# Patient Record
Sex: Female | Born: 1985 | Race: White | Hispanic: No | Smoking: Current every day smoker
Health system: Southern US, Community
[De-identification: ages and names within clinical notes are randomized; demographics above are authoritative.]

## PROBLEM LIST (undated history)

## (undated) ENCOUNTER — Inpatient Hospital Stay: Payer: Self-pay

## (undated) ENCOUNTER — Inpatient Hospital Stay (HOSPITAL_COMMUNITY): Payer: Self-pay

## (undated) DIAGNOSIS — K5090 Crohn's disease, unspecified, without complications: Secondary | ICD-10-CM

## (undated) DIAGNOSIS — J9383 Other pneumothorax: Secondary | ICD-10-CM

## (undated) DIAGNOSIS — R627 Adult failure to thrive: Secondary | ICD-10-CM

## (undated) DIAGNOSIS — F1010 Alcohol abuse, uncomplicated: Secondary | ICD-10-CM

## (undated) DIAGNOSIS — K7010 Alcoholic hepatitis without ascites: Secondary | ICD-10-CM

## (undated) DIAGNOSIS — C50919 Malignant neoplasm of unspecified site of unspecified female breast: Secondary | ICD-10-CM

## (undated) DIAGNOSIS — F418 Other specified anxiety disorders: Secondary | ICD-10-CM

## (undated) DIAGNOSIS — D696 Thrombocytopenia, unspecified: Secondary | ICD-10-CM

## (undated) DIAGNOSIS — I160 Hypertensive urgency: Secondary | ICD-10-CM

## (undated) DIAGNOSIS — H905 Unspecified sensorineural hearing loss: Secondary | ICD-10-CM

## (undated) DIAGNOSIS — I499 Cardiac arrhythmia, unspecified: Secondary | ICD-10-CM

## (undated) DIAGNOSIS — J930 Spontaneous tension pneumothorax: Secondary | ICD-10-CM

## (undated) DIAGNOSIS — N289 Disorder of kidney and ureter, unspecified: Secondary | ICD-10-CM

## (undated) DIAGNOSIS — K529 Noninfective gastroenteritis and colitis, unspecified: Secondary | ICD-10-CM

## (undated) DIAGNOSIS — Q453 Other congenital malformations of pancreas and pancreatic duct: Secondary | ICD-10-CM

## (undated) DIAGNOSIS — A749 Chlamydial infection, unspecified: Secondary | ICD-10-CM

## (undated) DIAGNOSIS — R569 Unspecified convulsions: Secondary | ICD-10-CM

## (undated) HISTORY — PX: CHEST TUBE INSERTION: SHX231

## (undated) HISTORY — DX: Unspecified sensorineural hearing loss: H90.5

## (undated) HISTORY — DX: Malignant neoplasm of unspecified site of unspecified female breast: C50.919

---
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
## 2004-02-29 ENCOUNTER — Encounter: Admission: RE | Admit: 2004-02-29 | Discharge: 2004-02-29 | Payer: Self-pay | Admitting: Family Medicine

## 2005-08-27 DIAGNOSIS — A749 Chlamydial infection, unspecified: Secondary | ICD-10-CM

## 2005-08-27 HISTORY — DX: Chlamydial infection, unspecified: A74.9

## 2005-10-06 ENCOUNTER — Emergency Department: Payer: Self-pay | Admitting: General Practice

## 2005-10-22 ENCOUNTER — Inpatient Hospital Stay (HOSPITAL_COMMUNITY): Admission: RE | Admit: 2005-10-22 | Discharge: 2005-10-22 | Payer: Self-pay | Admitting: *Deleted

## 2005-10-31 ENCOUNTER — Ambulatory Visit: Payer: Self-pay | Admitting: Obstetrics and Gynecology

## 2005-10-31 ENCOUNTER — Ambulatory Visit (HOSPITAL_COMMUNITY): Admission: RE | Admit: 2005-10-31 | Discharge: 2005-10-31 | Payer: Self-pay | Admitting: *Deleted

## 2005-11-11 ENCOUNTER — Emergency Department: Payer: Self-pay | Admitting: Emergency Medicine

## 2006-11-22 ENCOUNTER — Ambulatory Visit (HOSPITAL_COMMUNITY): Admission: RE | Admit: 2006-11-22 | Discharge: 2006-11-22 | Payer: Self-pay | Admitting: Obstetrics & Gynecology

## 2006-12-30 ENCOUNTER — Ambulatory Visit (HOSPITAL_COMMUNITY): Admission: RE | Admit: 2006-12-30 | Discharge: 2006-12-30 | Payer: Self-pay | Admitting: Obstetrics & Gynecology

## 2007-01-13 ENCOUNTER — Ambulatory Visit (HOSPITAL_COMMUNITY): Admission: RE | Admit: 2007-01-13 | Discharge: 2007-01-13 | Payer: Self-pay | Admitting: Obstetrics & Gynecology

## 2007-02-28 ENCOUNTER — Observation Stay: Payer: Self-pay

## 2007-04-21 ENCOUNTER — Ambulatory Visit: Payer: Self-pay | Admitting: Physician Assistant

## 2007-04-21 ENCOUNTER — Inpatient Hospital Stay (HOSPITAL_COMMUNITY): Admission: AD | Admit: 2007-04-21 | Discharge: 2007-04-22 | Payer: Self-pay | Admitting: Obstetrics & Gynecology

## 2007-04-28 DIAGNOSIS — D696 Thrombocytopenia, unspecified: Secondary | ICD-10-CM

## 2007-04-28 HISTORY — DX: Thrombocytopenia, unspecified: D69.6

## 2007-05-18 ENCOUNTER — Inpatient Hospital Stay (HOSPITAL_COMMUNITY): Admission: AD | Admit: 2007-05-18 | Discharge: 2007-05-21 | Payer: Self-pay | Admitting: Obstetrics & Gynecology

## 2007-05-18 ENCOUNTER — Ambulatory Visit: Payer: Self-pay | Admitting: Obstetrics & Gynecology

## 2008-08-27 DIAGNOSIS — I499 Cardiac arrhythmia, unspecified: Secondary | ICD-10-CM

## 2008-08-27 HISTORY — DX: Cardiac arrhythmia, unspecified: I49.9

## 2008-12-08 ENCOUNTER — Emergency Department: Payer: Self-pay | Admitting: Emergency Medicine

## 2009-04-25 ENCOUNTER — Ambulatory Visit (HOSPITAL_COMMUNITY): Admission: RE | Admit: 2009-04-25 | Discharge: 2009-04-25 | Payer: Self-pay | Admitting: Obstetrics & Gynecology

## 2009-06-06 ENCOUNTER — Ambulatory Visit (HOSPITAL_COMMUNITY): Admission: RE | Admit: 2009-06-06 | Discharge: 2009-06-06 | Payer: Self-pay | Admitting: Obstetrics & Gynecology

## 2009-06-27 ENCOUNTER — Ambulatory Visit (HOSPITAL_COMMUNITY): Admission: RE | Admit: 2009-06-27 | Discharge: 2009-06-27 | Payer: Self-pay | Admitting: Internal Medicine

## 2009-07-13 ENCOUNTER — Ambulatory Visit (HOSPITAL_COMMUNITY): Admission: RE | Admit: 2009-07-13 | Discharge: 2009-07-13 | Payer: Self-pay | Admitting: Family Medicine

## 2009-07-20 ENCOUNTER — Ambulatory Visit (HOSPITAL_COMMUNITY): Admission: RE | Admit: 2009-07-20 | Discharge: 2009-07-20 | Payer: Self-pay | Admitting: Family Medicine

## 2009-07-20 ENCOUNTER — Encounter: Payer: Self-pay | Admitting: Family Medicine

## 2009-07-21 ENCOUNTER — Inpatient Hospital Stay (HOSPITAL_COMMUNITY): Admission: AD | Admit: 2009-07-21 | Discharge: 2009-07-21 | Payer: Self-pay | Admitting: Family Medicine

## 2009-07-27 ENCOUNTER — Encounter: Payer: Self-pay | Admitting: Family Medicine

## 2009-07-27 ENCOUNTER — Ambulatory Visit (HOSPITAL_COMMUNITY): Admission: RE | Admit: 2009-07-27 | Discharge: 2009-07-27 | Payer: Self-pay | Admitting: Family Medicine

## 2009-07-30 ENCOUNTER — Inpatient Hospital Stay (HOSPITAL_COMMUNITY): Admission: AD | Admit: 2009-07-30 | Discharge: 2009-07-30 | Payer: Self-pay | Admitting: Obstetrics & Gynecology

## 2009-07-31 ENCOUNTER — Inpatient Hospital Stay (HOSPITAL_COMMUNITY): Admission: AD | Admit: 2009-07-31 | Discharge: 2009-08-02 | Payer: Self-pay | Admitting: Obstetrics & Gynecology

## 2009-07-31 ENCOUNTER — Encounter: Payer: Self-pay | Admitting: Obstetrics & Gynecology

## 2009-07-31 ENCOUNTER — Ambulatory Visit: Payer: Self-pay | Admitting: Obstetrics & Gynecology

## 2009-08-04 ENCOUNTER — Inpatient Hospital Stay (HOSPITAL_COMMUNITY): Admission: AD | Admit: 2009-08-04 | Discharge: 2009-08-04 | Payer: Self-pay | Admitting: Obstetrics & Gynecology

## 2009-08-04 ENCOUNTER — Ambulatory Visit: Payer: Self-pay | Admitting: Advanced Practice Midwife

## 2010-09-17 ENCOUNTER — Encounter: Payer: Self-pay | Admitting: *Deleted

## 2010-11-28 LAB — CBC
HCT: 37.4 % (ref 36.0–46.0)
Hemoglobin: 12.6 g/dL (ref 12.0–15.0)
Platelets: 214 10*3/uL (ref 150–400)
RBC: 4.05 MIL/uL (ref 3.87–5.11)

## 2010-11-28 LAB — TYPE AND SCREEN: ABO/RH(D): O POS

## 2010-11-28 LAB — RPR: RPR Ser Ql: NONREACTIVE

## 2011-01-09 NOTE — Discharge Summary (Signed)
NAMEDERHONDA, EASTLICK             ACCOUNT NO.:  [id number]   MEDICAL RECORD NO.:  [id number]          PATIENT TYPE:  INP   LOCATION:  9114                          FACILITY:  [facility]   PHYSICIAN:  Emily Filbert, MD        DATE OF BIRTH:  1986-05-19   DATE OF ADMISSION:  05/18/2007  DATE OF DISCHARGE:  05/21/2007                               DISCHARGE SUMMARY   ADMISSION DIAGNOSES:  1. Intrauterine pregnancy at 38 weeks 2 days.  2. Premature rupture of membranes.   DISCHARGE DIAGNOSIS:  Postpartum day #2 status post normal spontaneous  vaginal delivery of a viable female infant.   DISCHARGE MEDICATIONS:  1. Ibuprofen 600 mg one tablet p.o. q.6 hours p.r.n. pain.  2. Colace 100 mg one tablet p.o. b.i.d. p.r.n. constipation.  3. Dermaplast spray to be used per manufacturer instructions p.r.n.      laceration pain.   DISCHARGE LABORATORY DATA:  Hemoglobin 11.3, hematocrit 32.9, platelets  117, GBS negative, rubella immune, blood type O positive, antibody  negative, RPR nonreactive, hepatitis B surface antigen negative, HIV  negative.   HOSPITAL COURSE:  The patient is a 25 year old gravida 2, para 1-0-1-1,  who presented at 38-2/7 weeks with premature rupture of membranes.  She  went on to deliver a viable female via a normal spontaneous vaginal  delivery on September 22.  The infant's Apgars were 9 at one minute and  9 at five minutes.  There was a nuchal cord x1.  The patient did have a  second degree midline episiotomy cut which was repaired with 3-0 Vicryl  suture.  Her estimated blood loss was 350 mL.  During the course of her  labor, the patient was found to have some low platelets and elevated  blood pressures.  Her platelets were 127 on admission, 133 on repeat,  and then 117 by day of discharge.  Her other pregnancy-induced  hypertension laboratory values were within normal limits.  She did not  have any protein on her urinalysis.  Her uric acid level was 5.9.  Her  LDH and liver  transaminases were all within normal limits.  However,  given her decreased platelets and elevated blood pressure she was placed  on magnesium sulfate immediately prior to delivery and remained on it  for 24 hours postpartum.  She had been ruptured for 16 hours prior to  delivery of her infant.  The patient had an unremarkable postpartum  course and was deemed stable for discharge on postpartum day #2.  She  plans to bottle feed and did not desire a circumcision for her infant.  She also plans to use condoms for birth control.   DISCHARGE INSTRUCTIONS:  1. Nothing in vagina x6 weeks.  2. No heavy lifting x6 weeks.  3. Follow up at the [facility] Department in six weeks.  4. Take all medicines as directed.   DISPOSITION:  The patient is discharged to home in stable condition with  her infant.      Sherrell Puller, MD      Emily Filbert, MD  Electronically  Signed    TCB/MEDQ  D:  05/21/2007  T:  05/21/2007  Job:  [id number]

## 2011-06-07 LAB — CBC
HCT: 38.3
HCT: 38.8
Hemoglobin: 11.3 — ABNORMAL LOW
Hemoglobin: 13.1
Hemoglobin: 13.3
MCHC: 34.2
MCHC: 34.4
MCV: 91.9
MCV: 91.9
Platelets: 117 — ABNORMAL LOW
Platelets: 127 — ABNORMAL LOW
Platelets: 133 — ABNORMAL LOW
RBC: 4.22
RDW: 13.2
RDW: 13.6
RDW: 13.7
WBC: 10

## 2011-06-07 LAB — URINALYSIS, DIPSTICK ONLY
Bilirubin Urine: NEGATIVE
Glucose, UA: NEGATIVE
Hgb urine dipstick: NEGATIVE
Nitrite: NEGATIVE
Protein, ur: NEGATIVE
Urobilinogen, UA: 0.2
pH: 6

## 2011-06-07 LAB — URIC ACID: Uric Acid, Serum: 5.9

## 2011-06-07 LAB — COMPREHENSIVE METABOLIC PANEL
Chloride: 106
Creatinine, Ser: 0.49
GFR calc Af Amer: >60
Potassium: 3.9
Total Protein: 6.4

## 2011-06-07 LAB — DIFFERENTIAL
Basophils Absolute: 0
Basophils Relative: 0
Eosinophils Relative: 0
Monocytes Absolute: 1.1 — ABNORMAL HIGH
Neutro Abs: 8.6 — ABNORMAL HIGH

## 2011-06-07 LAB — LACTATE DEHYDROGENASE: LDH: 155

## 2011-06-07 LAB — SYPHILIS: RPR W/REFLEX TO RPR TITER AND TREPONEMAL ANTIBODIES, TRADITIONAL SCREENING AND DIAGNOSIS ALGORITHM: RPR Ser Ql: NONREACTIVE

## 2011-06-08 LAB — WET PREP, GENITAL
Clue Cells Wet Prep HPF POC: NONE SEEN
Yeast Wet Prep HPF POC: NONE SEEN

## 2011-08-31 ENCOUNTER — Inpatient Hospital Stay (HOSPITAL_COMMUNITY)
Admission: AD | Admit: 2011-08-31 | Discharge: 2011-08-31 | Disposition: A | Discharge status: Home / Self Care | Payer: Medicaid Other | Source: Ambulatory Visit | Attending: Obstetrics & Gynecology | Admitting: Obstetrics & Gynecology

## 2011-08-31 ENCOUNTER — Encounter (HOSPITAL_COMMUNITY): Payer: Self-pay | Admitting: *Deleted

## 2011-08-31 DIAGNOSIS — R300 Dysuria: Secondary | ICD-10-CM

## 2011-08-31 DIAGNOSIS — M549 Dorsalgia, unspecified: Secondary | ICD-10-CM | POA: Insufficient documentation

## 2011-08-31 HISTORY — DX: Cardiac arrhythmia, unspecified: I49.9

## 2011-08-31 HISTORY — DX: Chlamydial infection, unspecified: A74.9

## 2011-08-31 LAB — WET PREP, GENITAL
Clue Cells Wet Prep HPF POC: NONE SEEN
Trich, Wet Prep: NONE SEEN

## 2011-08-31 LAB — URINALYSIS, ROUTINE W REFLEX MICROSCOPIC
Glucose, UA: NEGATIVE mg/dL
Nitrite: NEGATIVE
Protein, ur: NEGATIVE mg/dL
Urobilinogen, UA: 0.2 mg/dL (ref 0.0–1.0)

## 2011-08-31 LAB — POCT PREGNANCY, URINE: Preg Test, Ur: NEGATIVE

## 2011-08-31 MED ORDER — FLUCONAZOLE 150 MG PO TABS: 150.0000 mg | ORAL_TABLET | Freq: Once | ORAL | Status: AC

## 2011-08-31 NOTE — Progress Notes (Signed)
Left flank pain x 2 days, feeling pressure with urination, voiding sufficient quantities, LMP 08/11/11

## 2011-08-31 NOTE — ED Provider Notes (Signed)
History     Chief Complaint  Patient presents with  . Dysuria   HPISandra JODENE Macias is 26 y.o. [phone] presents with left back pain.  Has slight dysuria, frequency, and urgency.  Reports elevated temp of 99.8 last night but normal today.  She does not have a doctor.  Sexually active with 1 partner.  Is concerned about vaginal infections too.   Past Medical History  Diagnosis Date  . Anxiety   . Chlamydia 2007  . Irregular heart beat 2010    wt/diet related after evaluation    Past Surgical History  Procedure Date  . Cesarean section     Family History  Problem Relation Age of Onset  . Anesthesia problems Neg Hx     History  Substance Use Topics  . Smoking status: Current Everyday Smoker -- 1.0 packs/day  . Smokeless tobacco: Never Used  . Alcohol Use: 0.0 oz/week     4 per month of vodka    Allergies:  Allergies  Allergen Reactions  . Sulfa Antibiotics Anaphylaxis  . Tetracyclines & Related Anaphylaxis  . Latex Hives and Itching    Prescriptions prior to admission  Medication Sig Dispense Refill  . acetaminophen (TYLENOL) 160 MG chewable tablet Chew 160 mg by mouth every 6 (six) hours as needed. For headache.       . NON FORMULARY Take 15 mLs by mouth daily. Cream of Tartar made at home. For UTI.         Review of Systems  Constitutional: Positive for fever. Negative for chills.  Gastrointestinal: Negative for nausea, vomiting and abdominal pain.  Genitourinary: Positive for dysuria, urgency, frequency and flank pain. Negative for hematuria.       Negative vaginal bleeding Positive for mild white vaginal discharge   Physical Exam   Blood pressure 108/71, pulse 89, temperature 98.3 F (36.8 C), temperature source Oral, resp. rate 16, height 5' 6"  (1.676 m), weight 94 lb 6.4 oz (42.82 kg), last menstrual period 08/11/2011.  Physical Exam  Constitutional: She is oriented to person, place, and time. She appears well-developed and well-nourished. No  distress.  HENT:  Head: Normocephalic.  Neck: Normal range of motion.  Cardiovascular: Normal rate.   Respiratory: Effort normal.  GI: Soft. She exhibits no distension and no mass. There is no tenderness. There is no rebound and no guarding.  Genitourinary: Uterus is tender. Uterus is not enlarged. Cervix exhibits no discharge and no friability. Right adnexum displays no mass, no tenderness and no fullness. Left adnexum displays no mass, no tenderness and no fullness. No bleeding around the vagina. Vaginal discharge (moderate amount of yellowish discharge with odor) found.  Musculoskeletal:       + for lower back pain left greater than right.  Negative for flank pain.  Neurological: She is alert and oriented to person, place, and time.  Skin: Skin is warm and dry.  Psychiatric: She has a normal mood and affect.    MAU Course  Procedures  GC/CHL culture to lab  MDM 18:33 Care turned over to S. Andres Labrum NP Results for orders placed during the hospital encounter of 08/31/11 (from the past 24 hour(s))  URINALYSIS, ROUTINE W REFLEX MICROSCOPIC     Status: Abnormal   Collection Time   08/31/11  4:30 PM      Component Value Range   Color, Urine YELLOW  YELLOW    APPearance CLEAR  CLEAR    Specific Gravity, Urine 1.025  1.005 - 1.030  pH 5.5  5.0 - 8.0    Glucose, UA NEGATIVE  NEGATIVE (mg/dL)   Hgb urine dipstick TRACE (*) NEGATIVE    Bilirubin Urine NEGATIVE  NEGATIVE    Ketones, ur NEGATIVE  NEGATIVE (mg/dL)   Protein, ur NEGATIVE  NEGATIVE (mg/dL)   Urobilinogen, UA 0.2  0.0 - 1.0 (mg/dL)   Nitrite NEGATIVE  NEGATIVE    Leukocytes, UA TRACE (*) NEGATIVE   URINE MICROSCOPIC-ADD ON     Status: Abnormal   Collection Time   08/31/11  4:30 PM      Component Value Range   Squamous Epithelial / LPF FEW (*) RARE    WBC, UA 7-10  <3 (WBC/hpf)   RBC / HPF 3-6  <3 (RBC/hpf)   Bacteria, UA FEW (*) RARE   POCT PREGNANCY, URINE     Status: Normal   Collection Time   08/31/11  4:38 PM       Component Value Range   Preg Test, Ur NEGATIVE    WET PREP, GENITAL     Status: Abnormal   Collection Time   08/31/11  6:15 PM      Component Value Range   Yeast, Wet Prep NONE SEEN  NONE SEEN    Trich, Wet Prep NONE SEEN  NONE SEEN    Clue Cells, Wet Prep NONE SEEN  NONE SEEN    WBC, Wet Prep HPF POC FEW (*) NONE SEEN    Assessment and Plan    KEY,EVE M 08/31/2011, 6:01 PM   Kristine Fisher, NP 08/31/11 1834

## 2011-09-01 LAB — GC/CHLAMYDIA PROBE AMP, GENITAL
Chlamydia, DNA Probe: NEGATIVE
GC Probe Amp, Genital: NEGATIVE

## 2011-09-02 ENCOUNTER — Emergency Department (HOSPITAL_COMMUNITY)
Admission: EM | Admit: 2011-09-02 | Discharge: 2011-09-02 | Disposition: A | Discharge status: Home / Self Care | Payer: Medicaid Other | Attending: Emergency Medicine | Admitting: Emergency Medicine

## 2011-09-02 ENCOUNTER — Encounter (HOSPITAL_COMMUNITY): Payer: Self-pay

## 2011-09-02 DIAGNOSIS — R300 Dysuria: Secondary | ICD-10-CM | POA: Insufficient documentation

## 2011-09-02 DIAGNOSIS — R109 Unspecified abdominal pain: Secondary | ICD-10-CM | POA: Insufficient documentation

## 2011-09-02 DIAGNOSIS — R3915 Urgency of urination: Secondary | ICD-10-CM | POA: Insufficient documentation

## 2011-09-02 DIAGNOSIS — N12 Tubulo-interstitial nephritis, not specified as acute or chronic: Secondary | ICD-10-CM | POA: Insufficient documentation

## 2011-09-02 DIAGNOSIS — R630 Anorexia: Secondary | ICD-10-CM | POA: Insufficient documentation

## 2011-09-02 DIAGNOSIS — R112 Nausea with vomiting, unspecified: Secondary | ICD-10-CM | POA: Insufficient documentation

## 2011-09-02 DIAGNOSIS — R000 Tachycardia, unspecified: Secondary | ICD-10-CM | POA: Insufficient documentation

## 2011-09-02 DIAGNOSIS — R509 Fever, unspecified: Secondary | ICD-10-CM | POA: Insufficient documentation

## 2011-09-02 DIAGNOSIS — N949 Unspecified condition associated with female genital organs and menstrual cycle: Secondary | ICD-10-CM | POA: Insufficient documentation

## 2011-09-02 DIAGNOSIS — K5900 Constipation, unspecified: Secondary | ICD-10-CM | POA: Insufficient documentation

## 2011-09-02 DIAGNOSIS — R350 Frequency of micturition: Secondary | ICD-10-CM | POA: Insufficient documentation

## 2011-09-02 HISTORY — DX: Disorder of kidney and ureter, unspecified: N28.9

## 2011-09-02 LAB — CBC
MCHC: 34.3 g/dL (ref 30.0–36.0)
Platelets: 257 10*3/uL (ref 150–400)
RDW: 13 % (ref 11.5–15.5)
WBC: 14.3 10*3/uL — ABNORMAL HIGH (ref 4.0–10.5)

## 2011-09-02 LAB — URINALYSIS, ROUTINE W REFLEX MICROSCOPIC
Glucose, UA: NEGATIVE mg/dL
Ketones, ur: 15 mg/dL — AB
Nitrite: POSITIVE — AB
Protein, ur: NEGATIVE mg/dL
pH: 5.5 (ref 5.0–8.0)

## 2011-09-02 LAB — BASIC METABOLIC PANEL
CO2: 22 mEq/L (ref 19–32)
Calcium: 9.4 mg/dL (ref 8.4–10.5)
Chloride: 100 mEq/L (ref 96–112)
Creatinine, Ser: 0.61 mg/dL (ref 0.50–1.10)
GFR calc Af Amer: >90 mL/min (ref >90)
Sodium: 134 mEq/L — ABNORMAL LOW (ref 135–145)

## 2011-09-02 LAB — DIFFERENTIAL
Basophils Absolute: 0 10*3/uL (ref 0.0–0.1)
Basophils Relative: 0 % (ref 0–1)
Lymphocytes Relative: 6 % — ABNORMAL LOW (ref 12–46)
Neutro Abs: 12 10*3/uL — ABNORMAL HIGH (ref 1.7–7.7)

## 2011-09-02 LAB — URINE MICROSCOPIC-ADD ON

## 2011-09-02 MED ORDER — CEPHALEXIN 500 MG PO CAPS: 500.0000 mg | ORAL_CAPSULE | Freq: Four times a day (QID) | ORAL | Status: AC

## 2011-09-02 MED ORDER — SODIUM CHLORIDE 0.9 % IV BOLUS (SEPSIS)
1000.0000 mL | Freq: Once | INTRAVENOUS | Status: AC
  Administered 2011-09-02: 1000 mL via INTRAVENOUS

## 2011-09-02 MED ORDER — DEXTROSE 5 % IV SOLN
1.0000 g | Freq: Once | INTRAVENOUS | Status: AC
  Administered 2011-09-02: 1 g via INTRAVENOUS
  Filled 2011-09-02: qty 10

## 2011-09-02 MED ORDER — FENTANYL CITRATE 0.05 MG/ML IJ SOLN: 50.0000 ug | Freq: Once | INTRAMUSCULAR | Status: DC

## 2011-09-02 MED ORDER — ONDANSETRON HCL 4 MG PO TABS: 4.0000 mg | ORAL_TABLET | Freq: Four times a day (QID) | ORAL | Status: AC

## 2011-09-02 MED ORDER — ONDANSETRON HCL 4 MG/2ML IJ SOLN: 4.0000 mg | Freq: Once | INTRAMUSCULAR | Status: DC

## 2011-09-02 MED ORDER — HYDROCODONE-ACETAMINOPHEN 5-500 MG PO TABS: 1.0000 | ORAL_TABLET | Freq: Four times a day (QID) | ORAL | Status: AC | PRN

## 2011-09-02 NOTE — ED Notes (Signed)
Patient was given crackers and a sprite

## 2011-09-02 NOTE — ED Notes (Signed)
Patient is resting comfortably. 

## 2011-09-02 NOTE — ED Notes (Signed)
Family at bedside. 

## 2011-09-02 NOTE — ED Notes (Signed)
-      PT WANTS TO HOLD OFF ON GETTING THE FENTANYL (4-5 / 10 PAIN) & ZOFRAN AT PRESENT.........Marland Kitchen

## 2011-09-02 NOTE — ED Notes (Signed)
Per report, patient did not want to take either zofran or fenantyl at this time.  meds are on hold at this point.

## 2011-09-02 NOTE — ED Notes (Signed)
MD at bedside. 

## 2011-09-02 NOTE — ED Provider Notes (Signed)
History     CSN: [id number]  Arrival date & time 09/02/11  1042   First MD Initiated Contact with Patient 09/02/11 1056      Chief Complaint  Patient presents with  . Flank Pain    (Consider location/radiation/quality/duration/timing/severity/associated sxs/prior treatment) Patient is a 26 y.o. female presenting with flank pain. The history is provided by the patient.  Flank Pain This is a new problem. Episode onset: 4 days ago. The problem occurs constantly. The problem has been gradually worsening. Pertinent negatives include no abdominal pain and no shortness of breath. Associated symptoms comments: Dysuria, fever, urgency and frequency. Exacerbated by: Urinate. The symptoms are relieved by nothing. Treatments tried: Was given a medication for a yeast and she was seen up within 2 days ago which she took yesterday. The treatment provided no relief.    Past Medical History  Diagnosis Date  . Anxiety   . Chlamydia 2007  . Irregular heart beat 2010    wt/diet related after evaluation  . Renal disorder     Past Surgical History  Procedure Date  . Cesarean section     Family History  Problem Relation Age of Onset  . Anesthesia problems Neg Hx     History  Substance Use Topics  . Smoking status: Current Everyday Smoker -- 1.0 packs/day  . Smokeless tobacco: Never Used  . Alcohol Use: 0.0 oz/week     4 per month of vodka    OB History    Grav Para Term Preterm Abortions TAB SAB Ect Mult Living   4 2 2  0 2 1 1  0 0 2      Review of Systems  Constitutional: Positive for fever, chills and appetite change.  Respiratory: Negative for cough and shortness of breath.   Gastrointestinal: Positive for nausea, vomiting and constipation. Negative for abdominal pain and diarrhea.  Genitourinary: Positive for dysuria, urgency, frequency and flank pain. Negative for vaginal bleeding, vaginal discharge and pelvic pain.  All other systems reviewed and are negative.    Allergies    Sulfa antibiotics; Tetracyclines & related; and Latex  Home Medications   Current Outpatient Rx  Name Route Sig Dispense Refill  . ACETAMINOPHEN 160 MG PO CHEW Oral Chew 160 mg by mouth every 6 (six) hours as needed. For headache.     . MULTIVITAMIN GUMMIES ADULT PO Oral Take 1 tablet by mouth daily.      Marland Kitchen FLUCONAZOLE 150 MG PO TABS Oral Take 1 tablet (150 mg total) by mouth once. 2 tablet 3    BP 108/84  Pulse 114  Temp(Src) 98.7 F (37.1 C) (Oral)  Resp 20  SpO2 100%  LMP 08/11/2011  Physical Exam  Nursing note and vitals reviewed. Constitutional: She is oriented to person, place, and time. She appears well-developed and well-nourished. She appears distressed.  HENT:  Head: Normocephalic and atraumatic.  Mouth/Throat: Oropharynx is clear and moist. Mucous membranes are dry.  Eyes: Conjunctivae and EOM are normal. Pupils are equal, round, and reactive to light.  Neck: Normal range of motion. Neck supple.  Cardiovascular: Regular rhythm and intact distal pulses.  Tachycardia present.   No murmur heard. Pulmonary/Chest: Effort normal and breath sounds normal. No respiratory distress. She has no wheezes. She has no rales.  Abdominal: Soft. Bowel sounds are normal. She exhibits no distension. There is no tenderness. There is CVA tenderness. There is no rebound and no guarding.       Severe left-sided flank tenderness  Musculoskeletal: Normal range  of motion. She exhibits no edema and no tenderness.  Neurological: She is alert and oriented to person, place, and time.  Skin: Skin is warm and dry. No rash noted. No erythema.  Psychiatric: She has a normal mood and affect. Her behavior is normal.    ED Course  Procedures (including critical care time)  Labs Reviewed  CBC - Abnormal; Notable for the following:    WBC 14.3 (*)    Hemoglobin 15.5 (*)    All other components within normal limits  DIFFERENTIAL - Abnormal; Notable for the following:    Neutrophils Relative 84 (*)     Neutro Abs 12.0 (*)    Lymphocytes Relative 6 (*)    Monocytes Absolute 1.3 (*)    All other components within normal limits  BASIC METABOLIC PANEL - Abnormal; Notable for the following:    Sodium 134 (*)    All other components within normal limits  URINALYSIS, ROUTINE W REFLEX MICROSCOPIC - Abnormal; Notable for the following:    APPearance CLOUDY (*)    Hgb urine dipstick SMALL (*)    Ketones, ur 15 (*)    Nitrite POSITIVE (*)    Leukocytes, UA MODERATE (*)    All other components within normal limits  URINE MICROSCOPIC-ADD ON - Abnormal; Notable for the following:    Bacteria, UA MANY (*)    All other components within normal limits  URINE CULTURE   No results found.   No diagnosis found.    MDM   Patient with ongoing urinary symptoms for the last 4 days with left flank pain starting 3 days ago. She was seen at [facility] on 08/31/2011 and had a UA at that time that showed trace leukocytes and 7-13 white blood cells but was not treated for a UTI. They stated she had a yeast infection however she had a wet prep that showed no yeast no true components no clue cells and only a few white blood cells. Patient states yesterday she took the medication 3 E. the last night the pain in the left flank and got worse and accompanied by nausea, vomiting, anorexia. Fever of 101 last night as well. The patient has never had a kidney stone but states she has had pyelonephritis in the past over 5 years ago. On exam today she has severe left flank tenderness and urgency with palpation of the bladder but no abdominal tenderness making appendicitis, diverticulitis, any other bowel pathology unlikely. She denies any vaginal discharge or vaginal complaints and with a normal wet prep Pap smear done just a few days ago with a negative pelvic exam feel that a vaginal source for symptoms is unlikely. Concern for UTI and pyelonephritis. CBC, BMP, UA pending. IV fluids, nausea, pain medication  given  11:50 AM Urine positive for a UTI. Will give IV Rocephin and will fluid challenge to insure tolerating by mouth as before going home.  1:57 PM Leukocytosis of 14,000 and normal BMP. On reevaluation the patient's pain is much improved and nausea is gone. Patient fluid challenge and did not vomit. Will discharge home with antibiotics and pain and nausea control.    Blanchie Dessert, MD 09/02/11 1358

## 2011-09-02 NOTE — ED Notes (Signed)
Pt in from home with c/o left flank pain states hx of kidney infection states onset of pain x4 days ago states some nausea denies vomiting states decreased intact for the past 4 days

## 2011-09-02 NOTE — ED Notes (Signed)
Vital signs stable. 

## 2011-09-04 LAB — URINE CULTURE: Culture  Setup Time: 201301061745

## 2011-09-05 NOTE — ED Notes (Signed)
+   urine Patient treated with Keflex-sensitive to same-chart appended per protocol MD.

## 2011-09-10 NOTE — ED Provider Notes (Signed)
Note not finished.   Not pregnant.  No signs of pyelo  Petar Mucci H. 09/10/2011 5:56 PM

## 2011-09-29 ENCOUNTER — Encounter (HOSPITAL_COMMUNITY): Payer: Self-pay | Admitting: *Deleted

## 2011-09-29 ENCOUNTER — Emergency Department (HOSPITAL_COMMUNITY)
Admission: EM | Admit: 2011-09-29 | Discharge: 2011-09-30 | Disposition: A | Discharge status: Home / Self Care | Payer: Medicaid Other | Attending: Emergency Medicine | Admitting: Emergency Medicine

## 2011-09-29 DIAGNOSIS — B9689 Other specified bacterial agents as the cause of diseases classified elsewhere: Secondary | ICD-10-CM | POA: Insufficient documentation

## 2011-09-29 DIAGNOSIS — A499 Bacterial infection, unspecified: Secondary | ICD-10-CM | POA: Insufficient documentation

## 2011-09-29 DIAGNOSIS — N760 Acute vaginitis: Secondary | ICD-10-CM | POA: Insufficient documentation

## 2011-09-29 DIAGNOSIS — F17200 Nicotine dependence, unspecified, uncomplicated: Secondary | ICD-10-CM | POA: Insufficient documentation

## 2011-09-29 NOTE — ED Notes (Signed)
Pt has recently finished an ABX that she was on x 5 days for a UTI.  Pt initially noticed a thick discharge that is white to yellow in color but not malodorous x 4 days ago.  Pt states that since that point, her vagina has become uncomfortable and swollen.

## 2011-09-30 LAB — URINALYSIS, MICROSCOPIC ONLY
Bilirubin Urine: NEGATIVE
Hgb urine dipstick: NEGATIVE
Ketones, ur: NEGATIVE mg/dL
Nitrite: NEGATIVE
Protein, ur: NEGATIVE mg/dL
Urobilinogen, UA: 0.2 mg/dL (ref 0.0–1.0)

## 2011-09-30 LAB — WET PREP, GENITAL
Trich, Wet Prep: NONE SEEN
Yeast Wet Prep HPF POC: NONE SEEN

## 2011-09-30 LAB — POCT PREGNANCY, URINE: Preg Test, Ur: NEGATIVE

## 2011-09-30 MED ORDER — METRONIDAZOLE 500 MG PO TABS: 500.0000 mg | ORAL_TABLET | Freq: Two times a day (BID) | ORAL | Status: AC

## 2011-09-30 NOTE — ED Notes (Signed)
Patient dressed for examination

## 2011-09-30 NOTE — ED Provider Notes (Signed)
Medical screening examination/treatment/procedure(s) were performed by non-physician practitioner and as supervising physician I was immediately available for consultation/collaboration.  Threasa Beards, MD 09/30/11 [phone]

## 2011-09-30 NOTE — ED Provider Notes (Signed)
History     CSN: [id number]  Arrival date & time 09/29/11  2248   First MD Initiated Contact with Patient 09/30/11 0026      Chief Complaint  Patient presents with  . Vaginal Pain  . Vaginal Discharge  . Groin Swelling    (Consider location/radiation/quality/duration/timing/severity/associated sxs/prior treatment) Patient is a 26 y.o. female presenting with vaginal pain and vaginal discharge. The history is provided by the patient.  Vaginal Pain This is a new problem. The current episode started 1 to 4 weeks ago. The problem occurs constantly. The problem has been gradually worsening. Pertinent negatives include no abdominal pain, fever or rash. She has tried nothing for the symptoms.  Vaginal Discharge Pertinent negatives include no abdominal pain, fever or rash.  Pt reports persistent vaginal burning and pain that has persisted for > week since being treated with abx for a UTI approx 2 weeks ago. Denies any obvious vag d/c, fever, rash, or lower abd pain. States she does have mild dysuria but believes this is related to the vag irritation vs UTI symptoms.  Past Medical History  Diagnosis Date  . Anxiety   . Chlamydia 2007  . Irregular heart beat 2010    wt/diet related after evaluation  . Renal disorder     Past Surgical History  Procedure Date  . Cesarean section     Family History  Problem Relation Age of Onset  . Anesthesia problems Neg Hx     History  Substance Use Topics  . Smoking status: Current Everyday Smoker -- 1.0 packs/day  . Smokeless tobacco: Never Used  . Alcohol Use: 0.0 oz/week     4 per month of vodka    OB History    Grav Para Term Preterm Abortions TAB SAB Ect Mult Living   4 2 2  0 2 1 1  0 0 2      Review of Systems  Constitutional: Negative.  Negative for fever.  HENT: Negative.   Eyes: Negative.   Respiratory: Negative.   Cardiovascular: Negative.   Gastrointestinal: Negative.  Negative for abdominal pain.  Genitourinary: Positive  for vaginal discharge and vaginal pain.  Musculoskeletal: Negative.   Skin: Negative.  Negative for rash.  Neurological: Negative.   Hematological: Negative.   Psychiatric/Behavioral: Negative.     Allergies  Sulfa antibiotics; Tetracyclines & related; and Latex  Home Medications  No current outpatient prescriptions on file.  BP 115/67  Pulse 62  Temp(Src) 98.7 F (37.1 C) (Oral)  Resp 20  SpO2 98%  LMP 08/11/2011  Physical Exam  Constitutional: She is oriented to person, place, and time. She appears well-developed and well-nourished.  HENT:  Head: Normocephalic and atraumatic.  Eyes: Conjunctivae are normal.  Neck: Neck supple.  Cardiovascular: Normal rate.   Pulmonary/Chest: Effort normal.  Abdominal: Soft.  Genitourinary: Uterus normal. There is no rash or tenderness on the right labia. There is no rash or tenderness on the left labia. Cervix exhibits no motion tenderness, no discharge and no friability. Right adnexum displays no mass and no tenderness. Left adnexum displays no mass and no tenderness. There is erythema around the vagina. No bleeding around the vagina. Vaginal discharge found.  Musculoskeletal: Normal range of motion.  Neurological: She is alert and oriented to person, place, and time.  Skin: Skin is warm and dry. No erythema.  Psychiatric: She has a normal mood and affect.    ED Course  Procedures  Findings and clinical impression discussed with patient. Will plan to discharge  home with treatment for BV and encourage her to keep her scheduled appointment with primary care physician in February. Patient agreeable with plan.  Labs Reviewed  URINALYSIS, WITH MICROSCOPIC - Abnormal; Notable for the following:    APPearance CLOUDY (*)    All other components within normal limits  WET PREP, GENITAL - Abnormal; Notable for the following:    Clue Cells Wet Prep HPF POC FEW (*)    WBC, Wet Prep HPF POC FEW (*)    All other components within normal limits    POCT PREGNANCY, URINE  GC/CHLAMYDIA PROBE AMP, GENITAL   No results found.   No diagnosis found.    MDM  HPI/PE and clinical findings c/w BV        Jeryl Columbia, NP 09/30/11 [phone]

## 2011-09-30 NOTE — ED Notes (Signed)
Pelvic cart outside the room

## 2011-10-01 LAB — GC/CHLAMYDIA PROBE AMP, GENITAL
Chlamydia, DNA Probe: NEGATIVE
GC Probe Amp, Genital: NEGATIVE

## 2012-04-10 ENCOUNTER — Encounter: Payer: Self-pay | Admitting: Family

## 2012-04-10 ENCOUNTER — Ambulatory Visit (INDEPENDENT_AMBULATORY_CARE_PROVIDER_SITE_OTHER): Payer: Medicaid Other | Admitting: Family

## 2012-04-10 VITALS — BP 112/70 | HR 89 | Temp 97.4°F | Ht 66.0 in | Wt 99.3 lb

## 2012-04-10 DIAGNOSIS — N760 Acute vaginitis: Secondary | ICD-10-CM | POA: Insufficient documentation

## 2012-04-10 DIAGNOSIS — O26899 Other specified pregnancy related conditions, unspecified trimester: Secondary | ICD-10-CM | POA: Insufficient documentation

## 2012-04-10 DIAGNOSIS — N9489 Other specified conditions associated with female genital organs and menstrual cycle: Secondary | ICD-10-CM | POA: Insufficient documentation

## 2012-04-10 DIAGNOSIS — R102 Pelvic and perineal pain: Secondary | ICD-10-CM

## 2012-04-10 DIAGNOSIS — O9989 Other specified diseases and conditions complicating pregnancy, childbirth and the puerperium: Secondary | ICD-10-CM

## 2012-04-10 DIAGNOSIS — N949 Unspecified condition associated with female genital organs and menstrual cycle: Secondary | ICD-10-CM

## 2012-04-10 NOTE — Progress Notes (Signed)
  Subjective:    Patient ID: Kristine Macias, female    DOB: [birth year], 26 y.o.   MRN: [id number]  HPI Pt is here with report of abnormal vaginal discharge and irritation x 8-9 months.  States has been treated two times with flagyl and three times with diflucan without a test being being done.  Pt also reports pelvic pain with intercourse.  States had a vaginal lesion in February '13, cultured for HPV>negative at [facility].  LMP 04/04/12, BCM condoms.   Review of Systems  Constitutional: Negative for appetite change and fatigue.  Genitourinary: Positive for vaginal discharge, vaginal pain, pelvic pain and dyspareunia. Negative for dysuria, flank pain, vaginal bleeding, difficulty urinating, genital sores and menstrual problem.       Objective:   Physical Exam  Constitutional: She is oriented to person, place, and time. She appears well-developed and well-nourished.  HENT:  Head: Normocephalic.  Neck: Normal range of motion. Neck supple.  Abdominal: Soft. There is no tenderness.  Genitourinary: Cervix exhibits no motion tenderness, no discharge and no friability. Right adnexum displays mass (posterior aspect of ovary) and tenderness (posterior aspect of ovary). Left adnexum displays no mass and no tenderness. Vaginal discharge (white, creamy discharge, no odor) found.  Neurological: She is alert and oriented to person, place, and time.  Skin: Skin is warm and dry.          Assessment & Plan:  Pelvic Pain Vaginitis Right pelvic mass  Plan: GC/CT, wet prep, genital culture Pelvic US Follow-up in 2-3 weeks [facility]

## 2012-04-10 NOTE — Progress Notes (Signed)
Ultrasound scheduled for 8/20 at 1:15 pm. Medicaid Authorization obtained: [id number]

## 2012-04-11 LAB — WET PREP, GENITAL: Trich, Wet Prep: NONE SEEN

## 2012-04-11 LAB — GC/CHLAMYDIA PROBE AMP, GENITAL: Chlamydia, DNA Probe: NEGATIVE

## 2012-04-13 LAB — CULTURE, ROUTINE-GENITAL: Organism ID, Bacteria: NORMAL

## 2012-04-15 ENCOUNTER — Ambulatory Visit (HOSPITAL_COMMUNITY)
Admission: RE | Admit: 2012-04-15 | Discharge: 2012-04-15 | Disposition: A | Discharge status: Home / Self Care | Payer: Medicaid Other | Source: Ambulatory Visit | Attending: Family | Admitting: Family

## 2012-04-15 DIAGNOSIS — R102 Pelvic and perineal pain: Secondary | ICD-10-CM

## 2012-04-15 DIAGNOSIS — N949 Unspecified condition associated with female genital organs and menstrual cycle: Secondary | ICD-10-CM | POA: Insufficient documentation

## 2012-04-24 ENCOUNTER — Ambulatory Visit (INDEPENDENT_AMBULATORY_CARE_PROVIDER_SITE_OTHER): Payer: Medicaid Other | Admitting: Obstetrics & Gynecology

## 2012-04-24 ENCOUNTER — Encounter: Payer: Self-pay | Admitting: Obstetrics & Gynecology

## 2012-04-24 VITALS — BP 101/69 | HR 80 | Temp 99.8°F | Ht 66.0 in | Wt 99.2 lb

## 2012-04-24 DIAGNOSIS — N949 Unspecified condition associated with female genital organs and menstrual cycle: Secondary | ICD-10-CM

## 2012-04-24 DIAGNOSIS — N760 Acute vaginitis: Secondary | ICD-10-CM

## 2012-04-24 DIAGNOSIS — R102 Pelvic and perineal pain: Secondary | ICD-10-CM

## 2012-04-24 DIAGNOSIS — B9689 Other specified bacterial agents as the cause of diseases classified elsewhere: Secondary | ICD-10-CM

## 2012-04-24 DIAGNOSIS — A499 Bacterial infection, unspecified: Secondary | ICD-10-CM

## 2012-04-24 NOTE — Progress Notes (Signed)
Subjective:     Patient ID: Kristine Macias, female   DOB: June 13, 1986, 26 y.o.   MRN: [id number]  HPI  Pt with h/o recurrent BV and pelvic pain.  No sx at present.  Presents for review of sono and labs.  H/o UTI ~1year prev and started to have sx of BV which were treated.  Pt reports that with the first episode of BV she began to have pain with intercourse.   The pelvic pain is 2x/month.    Review of Systems     Objective:   Physical Exam Deferred  sono 04/15/12  No pathology noted Cx: neg GC and chl      Assessment:     H/o recurrent BV and dyspareunia (with BV) here to review labs and sono  Plan:     Reviewed labs. Reviewed 'Go white' and avoid scents F/u prn  Bianney Rockwood L. Harraway-Smith, M.D., Cherlynn June

## 2012-04-24 NOTE — Patient Instructions (Signed)
Bacterial Vaginosis Bacterial vaginosis (BV) is a vaginal infection where the normal balance of bacteria in the vagina is disrupted. The normal balance is then replaced by an overgrowth of certain bacteria. There are several different kinds of bacteria that can cause BV. BV is the most common vaginal infection in women of childbearing age. CAUSES   The cause of BV is not fully understood. BV develops when there is an increase or imbalance of harmful bacteria.   Some activities or behaviors can upset the normal balance of bacteria in the vagina and put women at increased risk including:   Having a new sex partner or multiple sex partners.   Douching.   Using an intrauterine device (IUD) for contraception.   It is not clear what role sexual activity plays in the development of BV. However, women that have never had sexual intercourse are rarely infected with BV.  Women do not get BV from toilet seats, bedding, swimming pools or from touching objects around them.  SYMPTOMS   Grey vaginal discharge.   A fish-like odor with discharge, especially after sexual intercourse.   Itching or burning of the vagina and vulva.   Burning or pain with urination.   Some women have no signs or symptoms at all.  DIAGNOSIS  Your caregiver must examine the vagina for signs of BV. Your caregiver will perform lab tests and look at the sample of vaginal fluid through a microscope. They will look for bacteria and abnormal cells (clue cells), a pH test higher than 4.5, and a positive amine test all associated with BV.  RISKS AND COMPLICATIONS   Pelvic inflammatory disease (PID).   Infections following gynecology surgery.   Developing HIV.   Developing herpes virus.  TREATMENT  Sometimes BV will clear up without treatment. However, all women with symptoms of BV should be treated to avoid complications, especially if gynecology surgery is planned. Female partners generally do not need to be treated. However,  BV may spread between female sex partners so treatment is helpful in preventing a recurrence of BV.   BV may be treated with antibiotics. The antibiotics come in either pill or vaginal cream forms. Either can be used with nonpregnant or pregnant women, but the recommended dosages differ. These antibiotics are not harmful to the baby.   BV can recur after treatment. If this happens, a second round of antibiotics will often be prescribed.   Treatment is important for pregnant women. If not treated, BV can cause a premature delivery, especially for a pregnant woman who had a premature birth in the past. All pregnant women who have symptoms of BV should be checked and treated.   For chronic reoccurrence of BV, treatment with a type of prescribed gel vaginally twice a week is helpful.  HOME CARE INSTRUCTIONS   Finish all medication as directed by your caregiver.   Do not have sex until treatment is completed.   Tell your sexual partner that you have a vaginal infection. They should see their caregiver and be treated if they have problems, such as a mild rash or itching.   Practice safe sex. Use condoms. Only have 1 sex partner.  PREVENTION  Basic prevention steps can help reduce the risk of upsetting the natural balance of bacteria in the vagina and developing BV:  Do not have sexual intercourse (be abstinent).   Do not douche.   Use all of the medicine prescribed for treatment of BV, even if the signs and symptoms go away.  Tell your sex partner if you have BV. That way, they can be treated, if needed, to prevent reoccurrence.  SEEK MEDICAL CARE IF:   Your symptoms are not improving after 3 days of treatment.   You have increased discharge, pain, or fever.  MAKE SURE YOU:   Understand these instructions.   Will watch your condition.   Will get help right away if you are not doing well or get worse.  FOR MORE INFORMATION  Division of STD Prevention (DSTDP), Centers for Disease  Control and Prevention: [employer] (ASHA): www.ashastd.org  Document Released: 08/13/2005 Document Revised: 08/02/2011 Document Reviewed: 02/03/2009 [facility] Patient Information 2012 [place].

## 2012-06-13 ENCOUNTER — Encounter (HOSPITAL_COMMUNITY): Payer: Self-pay | Admitting: Emergency Medicine

## 2012-06-13 ENCOUNTER — Emergency Department (HOSPITAL_COMMUNITY)
Admission: EM | Admit: 2012-06-13 | Discharge: 2012-06-14 | Disposition: A | Discharge status: Home / Self Care | Payer: Self-pay | Attending: Emergency Medicine | Admitting: Emergency Medicine

## 2012-06-13 DIAGNOSIS — N289 Disorder of kidney and ureter, unspecified: Secondary | ICD-10-CM | POA: Insufficient documentation

## 2012-06-13 DIAGNOSIS — F17200 Nicotine dependence, unspecified, uncomplicated: Secondary | ICD-10-CM | POA: Insufficient documentation

## 2012-06-13 DIAGNOSIS — R42 Dizziness and giddiness: Secondary | ICD-10-CM | POA: Insufficient documentation

## 2012-06-13 LAB — POCT I-STAT, CHEM 8
Calcium, Ion: 1.2 mmol/L (ref 1.12–1.23)
Chloride: 105 mEq/L (ref 96–112)
Glucose, Bld: 77 mg/dL (ref 70–99)
HCT: 42 % (ref 36.0–46.0)
Hemoglobin: 14.3 g/dL (ref 12.0–15.0)
TCO2: 27 mmol/L (ref 0–100)

## 2012-06-13 LAB — POCT PREGNANCY, URINE: Preg Test, Ur: NEGATIVE

## 2012-06-13 LAB — URINALYSIS, ROUTINE W REFLEX MICROSCOPIC
Glucose, UA: NEGATIVE mg/dL
Ketones, ur: NEGATIVE mg/dL
Nitrite: NEGATIVE
Protein, ur: NEGATIVE mg/dL

## 2012-06-13 LAB — URINE MICROSCOPIC-ADD ON

## 2012-06-13 NOTE — ED Provider Notes (Signed)
History     CSN: [id number]  Arrival date & time 06/13/12  2102   First MD Initiated Contact with Patient 06/13/12 2234      Chief Complaint  Patient presents with  . Dizziness   HPI  History provided by the patient. Patient is 26 year old female with history of anxiety who presents with complaints of episodes of lightheadedness and dizziness. Symptoms first began yesterday and reports that she felt lightheaded and dizzy after standing. Symptoms lasted a few seconds to 1 minute and included some blurry vision and weakness in extremities. Patient denies having any LOC. Patient fell she may have been dehydrated and states she drink plenty of water yesterday. She began to have improvement but reports having recurrence of symptoms this evening while work. Symptoms only seem to be worse with standing or certain movements. They are brief in nature. Patient was seen by an ENT who had a difficult time taking her blood pressure and recommended that she be evaluated in the emergency room. Currently patient feels well. She denies any recent illness. Denies any fever, chills or sweats. Denies any cough, shortness of breath or chest pain. Denies any heart palpitations. She denies any increased anxiety feelings or panic attack.    Past Medical History  Diagnosis Date  . Anxiety   . Chlamydia 2007  . Irregular heart beat 2010    wt/diet related after evaluation  . Renal disorder     Past Surgical History  Procedure Date  . Cesarean section     Family History  Problem Relation Age of Onset  . Anesthesia problems Neg Hx   . Thyroid disease Mother   . Cancer Father   . Stroke Other     History  Substance Use Topics  . Smoking status: Current Every Day Smoker -- 1.0 packs/day    Types: Cigarettes  . Smokeless tobacco: Never Used  . Alcohol Use: No    OB History    Grav Para Term Preterm Abortions TAB SAB Ect Mult Living   4 2 2  0 2 1 1  0 0 2      Review of Systems    Constitutional: Negative for fever, chills and diaphoresis.  HENT: Negative for congestion, rhinorrhea and neck pain.   Respiratory: Negative for cough and shortness of breath.   Cardiovascular: Negative for chest pain and palpitations.  Gastrointestinal: Negative for nausea.  Neurological: Positive for dizziness, weakness and light-headedness. Negative for syncope and headaches.    Allergies  Sulfa antibiotics; Tetracyclines & related; and Latex  Home Medications   Current Outpatient Rx  Name Route Sig Dispense Refill  . ACETAMINOPHEN 325 MG PO TABS Oral Take 650 mg by mouth every 6 (six) hours as needed. Headache    . VITAMIN D PO Oral Take 1 tablet by mouth daily.    Marland Kitchen ONE-DAILY MULTI VITAMINS PO TABS Oral Take 1 tablet by mouth daily.    Marland Kitchen PROBIOTIC DAILY PO Oral Take 1 tablet by mouth daily.      BP 113/73  Pulse 86  Temp 98.4 F (36.9 C) (Oral)  Resp 18  SpO2 99%  LMP 06/01/2012  Physical Exam  Nursing note and vitals reviewed. Constitutional: She is oriented to person, place, and time. She appears well-developed and well-nourished. No distress.  HENT:  Head: Normocephalic and atraumatic.  Eyes: Conjunctivae normal and EOM are normal. Pupils are equal, round, and reactive to light.  Neck: Normal range of motion. Neck supple.  No meningeal signs  Cardiovascular: Normal rate and regular rhythm.   No murmur heard. Pulmonary/Chest: Effort normal and breath sounds normal. No respiratory distress. She has no wheezes. She has no rales.  Abdominal: Soft. There is no tenderness. There is no rigidity, no rebound, no guarding, no CVA tenderness and no tenderness at McBurney's point.  Neurological: She is alert and oriented to person, place, and time. She has normal strength. No cranial nerve deficit or sensory deficit. Gait normal.  Skin: Skin is warm and dry. No rash noted. No erythema.  Psychiatric: She has a normal mood and affect. Her behavior is normal.    ED  Course  Procedures   Results for orders placed during the hospital encounter of 06/13/12  URINALYSIS, ROUTINE W REFLEX MICROSCOPIC      Component Value Range   Color, Urine YELLOW  YELLOW   APPearance TURBID (*) CLEAR   Specific Gravity, Urine 1.023  1.005 - 1.030   pH 7.0  5.0 - 8.0   Glucose, UA NEGATIVE  NEGATIVE mg/dL   Hgb urine dipstick NEGATIVE  NEGATIVE   Bilirubin Urine NEGATIVE  NEGATIVE   Ketones, ur NEGATIVE  NEGATIVE mg/dL   Protein, ur NEGATIVE  NEGATIVE mg/dL   Urobilinogen, UA 0.2  0.0 - 1.0 mg/dL   Nitrite NEGATIVE  NEGATIVE   Leukocytes, UA LARGE (*) NEGATIVE  POCT PREGNANCY, URINE      Component Value Range   Preg Test, Ur NEGATIVE  NEGATIVE  POCT I-STAT, CHEM 8      Component Value Range   Sodium 143  135 - 145 mEq/L   Potassium 3.9  3.5 - 5.1 mEq/L   Chloride 105  96 - 112 mEq/L   BUN 19  6 - 23 mg/dL   Creatinine, Ser 1.00  0.50 - 1.10 mg/dL   Glucose, Bld 77  70 - 99 mg/dL   Calcium, Ion 1.20  1.12 - 1.23 mmol/L   TCO2 27  0 - 100 mmol/L   Hemoglobin 14.3  12.0 - 15.0 g/dL   HCT 42.0  36.0 - 46.0 %  URINE MICROSCOPIC-ADD ON      Component Value Range   Squamous Epithelial / LPF FEW (*) RARE   WBC, UA 0-2  <3 WBC/hpf   Bacteria, UA RARE  RARE   Urine-Other AMORPHOUS URATES/PHOSPHATES         1. Lightheaded       MDM  10:30PM patient seen and evaluated. Patient currently appears comfortable in no acute distress without complaints.       Date: 06/13/2012  Rate: 75  Rhythm: normal sinus rhythm  QRS Axis: normal  Intervals: PR shortened  ST/T Wave abnormalities: normal  Conduction Disutrbances:none  Narrative Interpretation:   Old EKG Reviewed: none available    Martie Lee, [place] 06/14/12 [phone]

## 2012-06-13 NOTE — ED Notes (Signed)
Pt states she woke up yesterday and went to get up and she became light headed, could not focus, and had pain in the back of her head  States she continues to have pain and dizziness and every now and then her vision becomes blurry  Pt states she has had tingling in her hands and feet   Pt states she went to work tonight and an EMT tried to check her blood pressure and was unable to hear it so recommended she come in for evaluation

## 2012-06-15 NOTE — ED Provider Notes (Signed)
Medical screening examination/treatment/procedure(s) were performed by non-physician practitioner and as supervising physician I was immediately available for consultation/collaboration.   Julianne Rice, MD 06/15/12 2340

## 2012-08-27 DIAGNOSIS — J9383 Other pneumothorax: Secondary | ICD-10-CM

## 2012-08-27 HISTORY — DX: Other pneumothorax: J93.83

## 2012-09-20 ENCOUNTER — Inpatient Hospital Stay: Payer: Self-pay | Admitting: Surgery

## 2012-09-30 ENCOUNTER — Ambulatory Visit: Payer: Self-pay | Admitting: Surgery

## 2012-11-23 ENCOUNTER — Encounter (HOSPITAL_COMMUNITY): Payer: Self-pay | Admitting: Emergency Medicine

## 2012-11-23 ENCOUNTER — Emergency Department (HOSPITAL_COMMUNITY): Payer: Medicaid Other

## 2012-11-23 ENCOUNTER — Emergency Department (HOSPITAL_COMMUNITY)
Admission: EM | Admit: 2012-11-23 | Discharge: 2012-11-24 | Disposition: A | Discharge status: Home / Self Care | Payer: Medicaid Other | Attending: Emergency Medicine | Admitting: Emergency Medicine

## 2012-11-23 DIAGNOSIS — Y9389 Activity, other specified: Secondary | ICD-10-CM | POA: Insufficient documentation

## 2012-11-23 DIAGNOSIS — W108XXA Fall (on) (from) other stairs and steps, initial encounter: Secondary | ICD-10-CM | POA: Insufficient documentation

## 2012-11-23 DIAGNOSIS — IMO0002 Reserved for concepts with insufficient information to code with codable children: Secondary | ICD-10-CM | POA: Insufficient documentation

## 2012-11-23 DIAGNOSIS — Z8659 Personal history of other mental and behavioral disorders: Secondary | ICD-10-CM | POA: Insufficient documentation

## 2012-11-23 DIAGNOSIS — W19XXXA Unspecified fall, initial encounter: Secondary | ICD-10-CM

## 2012-11-23 DIAGNOSIS — W1809XA Striking against other object with subsequent fall, initial encounter: Secondary | ICD-10-CM | POA: Insufficient documentation

## 2012-11-23 DIAGNOSIS — F17200 Nicotine dependence, unspecified, uncomplicated: Secondary | ICD-10-CM | POA: Insufficient documentation

## 2012-11-23 DIAGNOSIS — S060X0A Concussion without loss of consciousness, initial encounter: Secondary | ICD-10-CM | POA: Insufficient documentation

## 2012-11-23 DIAGNOSIS — S0990XA Unspecified injury of head, initial encounter: Secondary | ICD-10-CM | POA: Insufficient documentation

## 2012-11-23 DIAGNOSIS — Y92009 Unspecified place in unspecified non-institutional (private) residence as the place of occurrence of the external cause: Secondary | ICD-10-CM | POA: Insufficient documentation

## 2012-11-23 DIAGNOSIS — Z8619 Personal history of other infectious and parasitic diseases: Secondary | ICD-10-CM | POA: Insufficient documentation

## 2012-11-23 DIAGNOSIS — Z87448 Personal history of other diseases of urinary system: Secondary | ICD-10-CM | POA: Insufficient documentation

## 2012-11-23 DIAGNOSIS — R112 Nausea with vomiting, unspecified: Secondary | ICD-10-CM | POA: Insufficient documentation

## 2012-11-23 DIAGNOSIS — Z8709 Personal history of other diseases of the respiratory system: Secondary | ICD-10-CM | POA: Insufficient documentation

## 2012-11-23 DIAGNOSIS — Z8679 Personal history of other diseases of the circulatory system: Secondary | ICD-10-CM | POA: Insufficient documentation

## 2012-11-23 DIAGNOSIS — S01112A Laceration without foreign body of left eyelid and periocular area, initial encounter: Secondary | ICD-10-CM

## 2012-11-23 DIAGNOSIS — S0180XA Unspecified open wound of other part of head, initial encounter: Secondary | ICD-10-CM | POA: Insufficient documentation

## 2012-11-23 DIAGNOSIS — H539 Unspecified visual disturbance: Secondary | ICD-10-CM | POA: Insufficient documentation

## 2012-11-23 HISTORY — DX: Spontaneous tension pneumothorax: J93.0

## 2012-11-23 NOTE — ED Notes (Signed)
Pt states that she tripped and fell down four concrete steps at home hitting the left side of her face and the back of her head at the occipital lobe. Pt states she immediately had a HA and vomited x 1. She is still feeling dizzy and is having trouble focusing. Pt has a small lac above the left eye with swelling and her neck is tender. No bleeding or swelling at the neck.

## 2012-11-23 NOTE — ED Provider Notes (Signed)
History     CSN: [id number]  Arrival date & time 11/23/12  2142   First MD Initiated Contact with Patient 11/23/12 2231      Chief Complaint  Patient presents with  . Fall   HPI  History provided by the patient. Patient is a 27 year old female who presents with injuries after a fall. Patient states she was caring a laundry basket down the steps to her basement when she misstepped causing her to fall backwards. She landed on her back hitting the back of her head as well as the side of her head and eyebrow area. She denies any LOC but reports having some occasional blurred vision some spots, dizziness and episodes of nausea vomiting. She has tenderness and throbbing to the back of head. Continues to also complain of some neck soreness. Denies any weakness or numbness in extremities. Denies any other pains or injuries. No chest pain or shortness of breath. No lower back pains. She did not use any treatment for symptoms. Denies any other aggravating or alleviating factors. No other associated symptoms.   Past Medical History  Diagnosis Date  . Anxiety   . Chlamydia 2007  . Irregular heart beat 2010    wt/diet related after evaluation  . Renal disorder   . Pneumothorax, spontaneous, tension     Past Surgical History  Procedure Laterality Date  . Cesarean section    . Chest tube insertion      Family History  Problem Relation Age of Onset  . Anesthesia problems Neg Hx   . Thyroid disease Mother   . Cancer Father   . Stroke Other     History  Substance Use Topics  . Smoking status: Current Every Day Smoker -- 0.25 packs/day    Types: Cigarettes  . Smokeless tobacco: Never Used  . Alcohol Use: No    OB History   Grav Para Term Preterm Abortions TAB SAB Ect Mult Living   4 2 2  0 2 1 1  0 0 2      Review of Systems  Constitutional: Negative for chills and diaphoresis.  Eyes: Positive for visual disturbance. Negative for pain.  Gastrointestinal: Positive for nausea and  vomiting.  Neurological: Positive for headaches. Negative for syncope, weakness and numbness.  All other systems reviewed and are negative.    Allergies  Sulfa antibiotics; Tetracyclines & related; and Latex  Home Medications   Current Outpatient Rx  Name  Route  Sig  Dispense  Refill  . acetaminophen (TYLENOL) 325 MG tablet   Oral   Take 650 mg by mouth every 6 (six) hours as needed. Headache           BP 131/84  Pulse 80  Temp(Src) 98.4 F (36.9 C) (Oral)  Resp 20  SpO2 100%  LMP 11/07/2012  Physical Exam  Nursing note and vitals reviewed. Constitutional: She is oriented to person, place, and time. She appears well-developed and well-nourished. No distress.  HENT:  Head: Normocephalic.  3 mm laceration to left eyebrow. Bleeding controlled.  Eyes: Conjunctivae and EOM are normal. Pupils are equal, round, and reactive to light.  No nystagmus  Neck: Neck supple.  Cervical collar in place.  Cardiovascular: Normal rate and regular rhythm.   No murmur heard. Pulmonary/Chest: Effort normal and breath sounds normal. No respiratory distress.  Abdominal: Soft. There is no tenderness.  Musculoskeletal: Normal range of motion. She exhibits no edema.  Neurological: She is alert and oriented to person, place, and time. She has  normal strength. No cranial nerve deficit or sensory deficit. Coordination normal.  Skin: Skin is warm and dry. No rash noted.  Psychiatric: She has a normal mood and affect. Her behavior is normal.    ED Course  Procedures   LACERATION REPAIR Performed by: Martie Lee Authorized by: Martie Lee Consent: Verbal consent obtained. Risks and benefits: risks, benefits and alternatives were discussed Consent given by: patient Patient identity confirmed: provided demographic data Prepped and Draped in normal sterile fashion Wound explored  Laceration Location: Left eyebrow  Laceration Length: 0.3 cm  No Foreign Bodies seen or  palpated  Anesthesia: None   Irrigation method: syringe Amount of cleaning: standard  Skin closure: Dermabond   Patient tolerance: Patient tolerated the procedure well with no immediate complications.     Ct Head Wo Contrast  11/23/2012  *RADIOLOGY REPORT*  Clinical Data: The patient fell down basement stairs.  Multiple head lacerations.  Posterior neck pain.  Confusion.  Vomiting.  CT HEAD WITHOUT CONTRAST  Technique:  Contiguous axial images were obtained from the base of the skull through the vertex without contrast.  Comparison: None.  Findings: The ventricles and sulci are symmetrical without significant effacement, displacement, or dilatation. No mass effect or midline shift. No abnormal extra-axial fluid collections. The grey-white matter junction is distinct. Basal cisterns are not effaced. No acute intracranial hemorrhage. No depressed skull fractures.  Visualized paranasal sinuses and mastoid air cells are not opacified.  IMPRESSION: No acute intracranial abnormalities.   Original Report Authenticated By: Lucienne Capers, M.D.    Ct Cervical Spine Wo Contrast  11/23/2012  *RADIOLOGY REPORT*  Clinical Data: The patient fell down basement stairs.  Multiple head lacerations.  Posterior neck pain.  Confusion.  Vomiting.  CT CERVICAL SPINE WITHOUT CONTRAST  Technique:  Multidetector CT imaging of the cervical spine was performed. Multiplanar CT image reconstructions were also generated.  Comparison: None.  Findings: There is focal cortical buckling of the anterior aspect of the T2 vertebral body with some anterior paraspinal soft tissue swelling, suggesting a nondisplaced fracture of the anterior vertebral body.  No displaced fractures are identified.  Normal alignment of the cervical vertebrae and facet joints.  No vertebral compression deformities.  Intervertebral disc space heights are preserved.  No prevertebral soft tissue swelling.  Lateral masses of C1 appear symmetrical.  The odontoid  process appears intact.  IMPRESSION: Anterior cortical buckling of the T2 vertebral body with paraspinal soft tissue swelling suggesting nondisplaced fracture.  Cervical vertebrae appear intact.   Original Report Authenticated By: Lucienne Capers, M.D.      1. Fall, initial encounter   2. Vertebral compression fracture, initial encounter   3. Laceration of eyebrow, left, initial encounter   4. Concussion with no loss of consciousness, initial encounter       MDM  10:40 PM patient seen and evaluated. Patient appears well in no acute distress. Cervical collar in place. Patient offered pain medications feels comfortable at this time.  CT scans show signs of compression fracture to T2. No other acute or significant findings. C-collar was removed. Patient does exhibit some tenderness along the posterior cervical spine. There is no significant point tenderness over the T2 vertebrae spinous process. She has full range of motion with slight discomfort. Patient is neurologically intact.  Have discussed findings with patient. We'll provide soft collar for comfort as well as neurosurgeon referral for any issues of pain from her injury. Prescription for Norco provided.     Martie Lee, PA-C 11/24/12  [phone] 

## 2012-11-23 NOTE — ED Notes (Signed)
Family at bedside. 

## 2012-11-23 NOTE — ED Notes (Signed)
Pt states fell down concrete steps two hours ago.  Striking left face and back of head.  Pt positive for vomiting post fall. Laceration noted above left eye, covered with transparent dressing.  Pt states headache with dizziness.

## 2012-11-24 MED ORDER — HYDROCODONE-ACETAMINOPHEN 5-325 MG PO TABS: 1.0000 | ORAL_TABLET | ORAL | Status: DC | PRN

## 2012-11-24 NOTE — ED Provider Notes (Signed)
Medical screening examination/treatment/procedure(s) were performed by non-physician practitioner and as supervising physician I was immediately available for consultation/collaboration.   Delora Fuel, MD 02/40/97 [id number]

## 2013-02-24 DIAGNOSIS — K7010 Alcoholic hepatitis without ascites: Secondary | ICD-10-CM

## 2013-02-24 HISTORY — DX: Alcoholic hepatitis without ascites: K70.10

## 2013-03-20 ENCOUNTER — Emergency Department: Payer: Self-pay | Admitting: Emergency Medicine

## 2013-03-20 LAB — URINALYSIS, COMPLETE
Bilirubin,UR: NEGATIVE
Glucose,UR: NEGATIVE mg/dL (ref 0–75)
Ketone: NEGATIVE
Nitrite: NEGATIVE
Ph: 6 (ref 4.5–8.0)
Protein: NEGATIVE
Specific Gravity: 1.015 (ref 1.003–1.030)
WBC UR: 4 /HPF (ref 0–5)

## 2013-03-20 LAB — COMPREHENSIVE METABOLIC PANEL
Alkaline Phosphatase: 94 U/L (ref 50–136)
BUN: 9 mg/dL (ref 7–18)
Bilirubin,Total: 0.3 mg/dL (ref 0.2–1.0)
Calcium, Total: 8.8 mg/dL (ref 8.5–10.1)
Chloride: 106 mmol/L (ref 98–107)
Creatinine: 0.51 mg/dL — ABNORMAL LOW (ref 0.60–1.30)
EGFR (African American): >60
EGFR (Non-African Amer.): >60
Osmolality: 274 (ref 275–301)
Potassium: 4 mmol/L (ref 3.5–5.1)
SGOT(AST): 91 U/L — ABNORMAL HIGH (ref 15–37)
SGPT (ALT): 50 U/L (ref 12–78)
Sodium: 138 mmol/L (ref 136–145)

## 2013-03-20 LAB — DRUG SCREEN, URINE
Amphetamines, Ur Screen: NEGATIVE (ref ?–1000)
Barbiturates, Ur Screen: NEGATIVE (ref ?–200)
Benzodiazepine, Ur Scrn: NEGATIVE (ref ?–200)
MDMA (Ecstasy)Ur Screen: NEGATIVE (ref ?–500)
Methadone, Ur Screen: NEGATIVE (ref ?–300)
Opiate, Ur Screen: NEGATIVE (ref ?–300)
Tricyclic, Ur Screen: NEGATIVE (ref ?–1000)

## 2013-03-20 LAB — CBC
MCH: 32.3 pg (ref 26.0–34.0)
MCHC: 34 g/dL (ref 32.0–36.0)
WBC: 8.2 10*3/uL (ref 3.6–11.0)

## 2013-03-20 LAB — ETHANOL
Ethanol %: 0.106 % — ABNORMAL HIGH (ref 0.000–0.080)
Ethanol: 106 mg/dL

## 2013-03-20 LAB — TSH: Thyroid Stimulating Horm: 1.95 u[IU]/mL

## 2013-10-28 LAB — COMPREHENSIVE METABOLIC PANEL
ALK PHOS: 170 U/L — AB
Albumin: 4.4 g/dL (ref 3.4–5.0)
Anion Gap: 26 — ABNORMAL HIGH (ref 7–16)
BUN: 20 mg/dL — AB (ref 7–18)
Bilirubin,Total: 1.8 mg/dL — ABNORMAL HIGH (ref 0.2–1.0)
CHLORIDE: 99 mmol/L (ref 98–107)
Calcium, Total: 9.2 mg/dL (ref 8.5–10.1)
Co2: 9 mmol/L — CL (ref 21–32)
Creatinine: 1.24 mg/dL (ref 0.60–1.30)
EGFR (Non-African Amer.): 59 — ABNORMAL LOW
Glucose: 90 mg/dL (ref 65–99)
Osmolality: 270 (ref 275–301)
Potassium: 4.9 mmol/L (ref 3.5–5.1)
SGOT(AST): 305 U/L — ABNORMAL HIGH (ref 15–37)
SGPT (ALT): 141 U/L — ABNORMAL HIGH (ref 12–78)
SODIUM: 134 mmol/L — AB (ref 136–145)
Total Protein: 9.3 g/dL — ABNORMAL HIGH (ref 6.4–8.2)

## 2013-10-28 LAB — URINALYSIS, COMPLETE
BACTERIA: NONE SEEN
Bilirubin,UR: NEGATIVE
Glucose,UR: NEGATIVE mg/dL (ref 0–75)
Nitrite: NEGATIVE
PH: 5 (ref 4.5–8.0)
Protein: 100
RBC,UR: 5 /HPF (ref 0–5)
SPECIFIC GRAVITY: 1.028 (ref 1.003–1.030)
Squamous Epithelial: 9
WBC UR: 12 /HPF (ref 0–5)

## 2013-10-28 LAB — SALICYLATE LEVEL: Salicylates, Serum: <1.7 mg/dL

## 2013-10-28 LAB — HCG, QUANTITATIVE, PREGNANCY: Beta Hcg, Quant.: <1 m[IU]/mL — ABNORMAL LOW

## 2013-10-28 LAB — CBC
HCT: 49.7 % — ABNORMAL HIGH (ref 35.0–47.0)
HGB: 16 g/dL (ref 12.0–16.0)
MCH: 34.2 pg — AB (ref 26.0–34.0)
MCHC: 32.1 g/dL (ref 32.0–36.0)
MCV: 107 fL — ABNORMAL HIGH (ref 80–100)
PLATELETS: 241 10*3/uL (ref 150–440)
RBC: 4.66 10*6/uL (ref 3.80–5.20)
RDW: 14.1 % (ref 11.5–14.5)
WBC: 16.6 10*3/uL — AB (ref 3.6–11.0)

## 2013-10-28 LAB — ETHANOL
Ethanol %: 0.021 % (ref 0.000–0.080)
Ethanol: 21 mg/dL

## 2013-10-28 LAB — ACETAMINOPHEN LEVEL

## 2013-10-28 LAB — LIPASE, BLOOD: Lipase: 95 U/L (ref 73–393)

## 2013-10-29 ENCOUNTER — Inpatient Hospital Stay: Payer: Self-pay | Admitting: Internal Medicine

## 2013-10-29 LAB — COMPREHENSIVE METABOLIC PANEL
ALK PHOS: 85 U/L
ANION GAP: 10 (ref 7–16)
Albumin: 2.6 g/dL — ABNORMAL LOW (ref 3.4–5.0)
BUN: 17 mg/dL (ref 7–18)
Bilirubin,Total: 1.1 mg/dL — ABNORMAL HIGH (ref 0.2–1.0)
CREATININE: 0.84 mg/dL (ref 0.60–1.30)
Calcium, Total: 6.3 mg/dL — CL (ref 8.5–10.1)
Chloride: 114 mmol/L — ABNORMAL HIGH (ref 98–107)
Co2: 15 mmol/L — ABNORMAL LOW (ref 21–32)
EGFR (African American): >60
EGFR (Non-African Amer.): >60
GLUCOSE: 286 mg/dL — AB (ref 65–99)
Osmolality: 290 (ref 275–301)
Potassium: 4.5 mmol/L (ref 3.5–5.1)
SGOT(AST): 162 U/L — ABNORMAL HIGH (ref 15–37)
SGPT (ALT): 76 U/L (ref 12–78)
Sodium: 139 mmol/L (ref 136–145)
TOTAL PROTEIN: 5.3 g/dL — AB (ref 6.4–8.2)

## 2013-10-29 LAB — BASIC METABOLIC PANEL
ANION GAP: 5 — AB (ref 7–16)
Anion Gap: 6 — ABNORMAL LOW (ref 7–16)
BUN: 19 mg/dL — AB (ref 7–18)
BUN: 16 mg/dL (ref 7–18)
CALCIUM: 6.8 mg/dL — AB (ref 8.5–10.1)
CALCIUM: 6.7 mg/dL — AB (ref 8.5–10.1)
CO2: 26 mmol/L (ref 21–32)
Chloride: 113 mmol/L — ABNORMAL HIGH (ref 98–107)
Chloride: 108 mmol/L — ABNORMAL HIGH (ref 98–107)
Co2: 22 mmol/L (ref 21–32)
Creatinine: 0.88 mg/dL (ref 0.60–1.30)
Creatinine: 0.65 mg/dL (ref 0.60–1.30)
EGFR (African American): >60
GLUCOSE: 165 mg/dL — AB (ref 65–99)
Glucose: 202 mg/dL — ABNORMAL HIGH (ref 65–99)
Osmolality: 287 (ref 275–301)
Osmolality: 284 (ref 275–301)
POTASSIUM: 3.1 mmol/L — AB (ref 3.5–5.1)
Potassium: 3.8 mmol/L (ref 3.5–5.1)
SODIUM: 140 mmol/L (ref 136–145)
Sodium: 140 mmol/L (ref 136–145)

## 2013-10-29 LAB — CBC WITH DIFFERENTIAL/PLATELET
BASOS ABS: 0 10*3/uL (ref 0.0–0.1)
BASOS PCT: 0.1 %
EOS PCT: 0 %
Eosinophil #: 0 10*3/uL (ref 0.0–0.7)
HCT: 33.6 % — ABNORMAL LOW (ref 35.0–47.0)
HGB: 11.1 g/dL — AB (ref 12.0–16.0)
Lymphocyte #: 0.4 10*3/uL — ABNORMAL LOW (ref 1.0–3.6)
Lymphocyte %: 4.2 %
MCH: 34.4 pg — AB (ref 26.0–34.0)
MCHC: 33.2 g/dL (ref 32.0–36.0)
MCV: 104 fL — ABNORMAL HIGH (ref 80–100)
Monocyte #: 1.2 x10 3/mm — ABNORMAL HIGH (ref 0.2–0.9)
Monocyte %: 11.1 %
Neutrophil #: 8.9 10*3/uL — ABNORMAL HIGH (ref 1.4–6.5)
Neutrophil %: 84.6 %
PLATELETS: 135 10*3/uL — AB (ref 150–440)
RBC: 3.24 10*6/uL — AB (ref 3.80–5.20)
RDW: 13.7 % (ref 11.5–14.5)
WBC: 10.5 10*3/uL (ref 3.6–11.0)

## 2013-10-29 LAB — PHOSPHORUS
PHOSPHORUS: 0.8 mg/dL — AB (ref 2.5–4.9)
PHOSPHORUS: 2.7 mg/dL (ref 2.5–4.9)

## 2013-10-29 LAB — MAGNESIUM
Magnesium: 2.2 mg/dL
Magnesium: 2.2 mg/dL
Magnesium: 2.3 mg/dL

## 2013-10-30 LAB — CBC WITH DIFFERENTIAL/PLATELET
BASOS ABS: 0.1 10*3/uL (ref 0.0–0.1)
Basophil %: 0.7 %
EOS ABS: 0 10*3/uL (ref 0.0–0.7)
EOS PCT: 0.4 %
HCT: 32.5 % — ABNORMAL LOW (ref 35.0–47.0)
HGB: 10.9 g/dL — ABNORMAL LOW (ref 12.0–16.0)
Lymphocyte #: 1.6 10*3/uL (ref 1.0–3.6)
Lymphocyte %: 17.5 %
MCH: 33.6 pg (ref 26.0–34.0)
MCHC: 33.4 g/dL (ref 32.0–36.0)
MCV: 101 fL — AB (ref 80–100)
MONO ABS: 0.6 x10 3/mm (ref 0.2–0.9)
Monocyte %: 6.1 %
NEUTROS ABS: 6.8 10*3/uL — AB (ref 1.4–6.5)
NEUTROS PCT: 75.3 %
Platelet: 108 10*3/uL — ABNORMAL LOW (ref 150–440)
RBC: 3.23 10*6/uL — ABNORMAL LOW (ref 3.80–5.20)
RDW: 13.3 % (ref 11.5–14.5)
WBC: 9.1 10*3/uL (ref 3.6–11.0)

## 2013-10-30 LAB — BASIC METABOLIC PANEL
ANION GAP: 7 (ref 7–16)
Anion Gap: 6 — ABNORMAL LOW (ref 7–16)
Anion Gap: 4 — ABNORMAL LOW (ref 7–16)
BUN: 11 mg/dL (ref 7–18)
BUN: 7 mg/dL (ref 7–18)
BUN: 5 mg/dL — ABNORMAL LOW (ref 7–18)
CALCIUM: 7.1 mg/dL — AB (ref 8.5–10.1)
CO2: 28 mmol/L (ref 21–32)
CREATININE: 0.67 mg/dL (ref 0.60–1.30)
CREATININE: 0.55 mg/dL — AB (ref 0.60–1.30)
Calcium, Total: 7.9 mg/dL — ABNORMAL LOW (ref 8.5–10.1)
Calcium, Total: 7.8 mg/dL — ABNORMAL LOW (ref 8.5–10.1)
Chloride: 107 mmol/L (ref 98–107)
Chloride: 107 mmol/L (ref 98–107)
Chloride: 108 mmol/L — ABNORMAL HIGH (ref 98–107)
Co2: 28 mmol/L (ref 21–32)
Co2: 29 mmol/L (ref 21–32)
Creatinine: 0.53 mg/dL — ABNORMAL LOW (ref 0.60–1.30)
EGFR (African American): >60
EGFR (Non-African Amer.): >60
EGFR (Non-African Amer.): >60
EGFR (Non-African Amer.): >60
GLUCOSE: 114 mg/dL — AB (ref 65–99)
Glucose: 138 mg/dL — ABNORMAL HIGH (ref 65–99)
Glucose: 95 mg/dL (ref 65–99)
OSMOLALITY: 278 (ref 275–301)
Osmolality: 283 (ref 275–301)
Osmolality: 282 (ref 275–301)
POTASSIUM: 3.5 mmol/L (ref 3.5–5.1)
Potassium: 3.1 mmol/L — ABNORMAL LOW (ref 3.5–5.1)
Potassium: 3.2 mmol/L — ABNORMAL LOW (ref 3.5–5.1)
SODIUM: 142 mmol/L (ref 136–145)
SODIUM: 141 mmol/L (ref 136–145)
Sodium: 141 mmol/L (ref 136–145)

## 2013-10-30 LAB — PHOSPHORUS
Phosphorus: 1 mg/dL — CL (ref 2.5–4.9)
Phosphorus: 3.4 mg/dL (ref 2.5–4.9)

## 2013-10-31 LAB — PROTIME-INR
INR: 1
Prothrombin Time: 13 secs (ref 11.5–14.7)

## 2013-11-02 LAB — CBC WITH DIFFERENTIAL/PLATELET
BASOS PCT: 0.5 %
Basophil #: 0 10*3/uL (ref 0.0–0.1)
EOS PCT: 1.5 %
Eosinophil #: 0.2 10*3/uL (ref 0.0–0.7)
HCT: 37.2 % (ref 35.0–47.0)
HGB: 12.7 g/dL (ref 12.0–16.0)
LYMPHS ABS: 1.5 10*3/uL (ref 1.0–3.6)
LYMPHS PCT: 15.1 %
MCH: 34 pg (ref 26.0–34.0)
MCHC: 34.2 g/dL (ref 32.0–36.0)
MCV: 99 fL (ref 80–100)
Monocyte #: 1.3 x10 3/mm — ABNORMAL HIGH (ref 0.2–0.9)
Monocyte %: 12.6 %
Neutrophil #: 7 10*3/uL — ABNORMAL HIGH (ref 1.4–6.5)
Neutrophil %: 70.3 %
Platelet: 164 10*3/uL (ref 150–440)
RBC: 3.74 10*6/uL — ABNORMAL LOW (ref 3.80–5.20)
RDW: 13.2 % (ref 11.5–14.5)
WBC: 9.9 10*3/uL (ref 3.6–11.0)

## 2013-11-02 LAB — LACTATE DEHYDROGENASE, PLEURAL OR PERITONEAL FLUID: LDH, BODY FLUID: 131 U/L

## 2013-11-02 LAB — BASIC METABOLIC PANEL
Anion Gap: 7 (ref 7–16)
BUN: 4 mg/dL — ABNORMAL LOW (ref 7–18)
CALCIUM: 8.5 mg/dL (ref 8.5–10.1)
Chloride: 104 mmol/L (ref 98–107)
Co2: 26 mmol/L (ref 21–32)
Creatinine: 0.55 mg/dL — ABNORMAL LOW (ref 0.60–1.30)
EGFR (African American): >60
GLUCOSE: 83 mg/dL (ref 65–99)
OSMOLALITY: 270 (ref 275–301)
POTASSIUM: 2.8 mmol/L — AB (ref 3.5–5.1)
Sodium: 137 mmol/L (ref 136–145)

## 2013-11-02 LAB — BODY FLUID CELL COUNT WITH DIFFERENTIAL
BASOS ABS: 0 %
Eosinophil: 0 %
Lymphocytes: 7 %
NUCLEATED CELL COUNT: 595 /mm3
Neutrophils: 27 %
Other Cells BF: 0 %
Other Mononuclear Cells: 66 %

## 2013-11-02 LAB — CLOSTRIDIUM DIFFICILE(ARMC)

## 2013-11-02 LAB — PROTEIN, BODY FLUID: PROTEIN, BODY FLUID: 2.2 g/dL

## 2013-11-02 LAB — GLUCOSE, SEROUS FLUID: Glucose, Body Fluid: 94 mg/dL

## 2013-11-02 LAB — ALBUMIN, FLUID (OTHER): Body Fluid Albumin: 1.4 g/dL

## 2013-11-02 LAB — MAGNESIUM: MAGNESIUM: 1.6 mg/dL — AB

## 2013-11-02 LAB — LACTATE DEHYDROGENASE: LDH: 295 U/L — AB (ref 81–246)

## 2013-11-02 LAB — POTASSIUM: Potassium: 3.4 mmol/L — ABNORMAL LOW (ref 3.5–5.1)

## 2013-11-03 LAB — CBC WITH DIFFERENTIAL/PLATELET
Basophil #: 0 10*3/uL (ref 0.0–0.1)
Basophil %: 0.6 %
EOS ABS: 0.1 10*3/uL (ref 0.0–0.7)
Eosinophil %: 1.3 %
HCT: 37.7 % (ref 35.0–47.0)
HGB: 13.1 g/dL (ref 12.0–16.0)
LYMPHS ABS: 1.3 10*3/uL (ref 1.0–3.6)
Lymphocyte %: 16.1 %
MCH: 34.5 pg — ABNORMAL HIGH (ref 26.0–34.0)
MCHC: 34.7 g/dL (ref 32.0–36.0)
MCV: 100 fL (ref 80–100)
Monocyte #: 1.4 x10 3/mm — ABNORMAL HIGH (ref 0.2–0.9)
Monocyte %: 17.4 %
Neutrophil #: 5.2 10*3/uL (ref 1.4–6.5)
Neutrophil %: 64.6 %
Platelet: 201 10*3/uL (ref 150–440)
RBC: 3.79 10*6/uL — ABNORMAL LOW (ref 3.80–5.20)
RDW: 13.7 % (ref 11.5–14.5)
WBC: 8.1 10*3/uL (ref 3.6–11.0)

## 2013-11-06 LAB — BODY FLUID CULTURE

## 2014-06-28 ENCOUNTER — Encounter (HOSPITAL_COMMUNITY): Payer: Self-pay | Admitting: Emergency Medicine

## 2014-07-18 ENCOUNTER — Inpatient Hospital Stay: Payer: Self-pay | Admitting: Internal Medicine

## 2014-07-18 LAB — DRUG SCREEN, URINE
AMPHETAMINES, UR SCREEN: NEGATIVE (ref ?–1000)
BENZODIAZEPINE, UR SCRN: NEGATIVE (ref ?–200)
Barbiturates, Ur Screen: NEGATIVE (ref ?–200)
COCAINE METABOLITE, UR ~~LOC~~: NEGATIVE (ref ?–300)
Cannabinoid 50 Ng, Ur ~~LOC~~: NEGATIVE (ref ?–50)
MDMA (ECSTASY) UR SCREEN: NEGATIVE (ref ?–500)
METHADONE, UR SCREEN: NEGATIVE (ref ?–300)
Opiate, Ur Screen: NEGATIVE (ref ?–300)
PHENCYCLIDINE (PCP) UR S: NEGATIVE (ref ?–25)
Tricyclic, Ur Screen: NEGATIVE (ref ?–1000)

## 2014-07-18 LAB — CBC WITH DIFFERENTIAL/PLATELET
BASOS ABS: 0 10*3/uL (ref 0.0–0.1)
Basophil %: 0.5 %
EOS ABS: 0 10*3/uL (ref 0.0–0.7)
Eosinophil %: 0.4 %
HCT: 47.4 % — AB (ref 35.0–47.0)
HGB: 16.4 g/dL — ABNORMAL HIGH (ref 12.0–16.0)
Lymphocyte #: 1.7 10*3/uL (ref 1.0–3.6)
Lymphocyte %: 21.5 %
MCH: 34.5 pg — ABNORMAL HIGH (ref 26.0–34.0)
MCHC: 34.6 g/dL (ref 32.0–36.0)
MCV: 100 fL (ref 80–100)
MONO ABS: 0.7 x10 3/mm (ref 0.2–0.9)
Monocyte %: 8.3 %
Neutrophil #: 5.5 10*3/uL (ref 1.4–6.5)
Neutrophil %: 69.3 %
Platelet: 249 10*3/uL (ref 150–440)
RBC: 4.74 10*6/uL (ref 3.80–5.20)
RDW: 14.4 % (ref 11.5–14.5)
WBC: 8 10*3/uL (ref 3.6–11.0)

## 2014-07-18 LAB — URINALYSIS, COMPLETE
BILIRUBIN, UR: NEGATIVE
Glucose,UR: 50 mg/dL (ref 0–75)
KETONE: NEGATIVE
Leukocyte Esterase: NEGATIVE
NITRITE: NEGATIVE
PH: 6 (ref 4.5–8.0)
Protein: NEGATIVE
RBC,UR: 1 /HPF (ref 0–5)
Specific Gravity: 1.018 (ref 1.003–1.030)
Squamous Epithelial: 2

## 2014-07-18 LAB — SALICYLATE LEVEL: Salicylates, Serum: <1.7 mg/dL

## 2014-07-18 LAB — LIPASE, BLOOD: LIPASE: 104 U/L (ref 73–393)

## 2014-07-18 LAB — COMPREHENSIVE METABOLIC PANEL
ALBUMIN: 3 g/dL — AB (ref 3.4–5.0)
ALT: 66 U/L — AB
AST: 199 U/L — AB (ref 15–37)
Alkaline Phosphatase: 151 U/L — ABNORMAL HIGH
Anion Gap: 14 (ref 7–16)
BILIRUBIN TOTAL: 0.7 mg/dL (ref 0.2–1.0)
BUN: 8 mg/dL (ref 7–18)
CHLORIDE: 95 mmol/L — AB (ref 98–107)
CO2: 27 mmol/L (ref 21–32)
CREATININE: 0.75 mg/dL (ref 0.60–1.30)
Calcium, Total: 8.1 mg/dL — ABNORMAL LOW (ref 8.5–10.1)
EGFR (African American): >60
EGFR (Non-African Amer.): >60
GLUCOSE: 151 mg/dL — AB (ref 65–99)
Osmolality: 273 (ref 275–301)
POTASSIUM: 2.9 mmol/L — AB (ref 3.5–5.1)
Sodium: 136 mmol/L (ref 136–145)
TOTAL PROTEIN: 7.2 g/dL (ref 6.4–8.2)

## 2014-07-18 LAB — OCCULT BLOOD X 1 CARD TO LAB, STOOL: Occult Blood, Feces: POSITIVE

## 2014-07-18 LAB — MAGNESIUM: MAGNESIUM: 1.7 mg/dL — AB

## 2014-07-18 LAB — PHOSPHORUS: Phosphorus: 2.8 mg/dL (ref 2.5–4.9)

## 2014-07-18 LAB — ACETAMINOPHEN LEVEL: Acetaminophen: <2 ug/mL

## 2014-07-19 LAB — CBC WITH DIFFERENTIAL/PLATELET
Basophil #: 0 10*3/uL (ref 0.0–0.1)
Basophil %: 0.7 %
Eosinophil #: 0.1 10*3/uL (ref 0.0–0.7)
Eosinophil %: 1.5 %
HCT: 34.8 % — ABNORMAL LOW (ref 35.0–47.0)
HGB: 11.7 g/dL — ABNORMAL LOW (ref 12.0–16.0)
Lymphocyte #: 1.7 10*3/uL (ref 1.0–3.6)
Lymphocyte %: 25.6 %
MCH: 34.8 pg — AB (ref 26.0–34.0)
MCHC: 33.8 g/dL (ref 32.0–36.0)
MCV: 103 fL — ABNORMAL HIGH (ref 80–100)
Monocyte #: 0.4 x10 3/mm (ref 0.2–0.9)
Monocyte %: 6.4 %
Neutrophil #: 4.4 10*3/uL (ref 1.4–6.5)
Neutrophil %: 65.8 %
PLATELETS: 165 10*3/uL (ref 150–440)
RBC: 3.37 10*6/uL — AB (ref 3.80–5.20)
RDW: 14 % (ref 11.5–14.5)
WBC: 6.6 10*3/uL (ref 3.6–11.0)

## 2014-07-19 LAB — BASIC METABOLIC PANEL
ANION GAP: 7 (ref 7–16)
BUN: 9 mg/dL (ref 7–18)
CALCIUM: 6.8 mg/dL — AB (ref 8.5–10.1)
CHLORIDE: 112 mmol/L — AB (ref 98–107)
CO2: 25 mmol/L (ref 21–32)
Creatinine: 0.68 mg/dL (ref 0.60–1.30)
EGFR (Non-African Amer.): >60
Glucose: 108 mg/dL — ABNORMAL HIGH (ref 65–99)
OSMOLALITY: 286 (ref 275–301)
POTASSIUM: 3.5 mmol/L (ref 3.5–5.1)
Sodium: 144 mmol/L (ref 136–145)

## 2014-07-19 LAB — LIPID PANEL
Cholesterol: 81 mg/dL (ref 0–200)
HDL: 47 mg/dL (ref 40–60)
LDL CHOLESTEROL, CALC: 21 mg/dL (ref 0–100)
Triglycerides: 64 mg/dL (ref 0–200)
VLDL Cholesterol, Calc: 13 mg/dL (ref 5–40)

## 2014-07-19 LAB — HEMATOCRIT: HCT: 37.6 % (ref 35.0–47.0)

## 2014-07-19 LAB — MAGNESIUM: Magnesium: 2.5 mg/dL — ABNORMAL HIGH

## 2014-07-19 LAB — HEMOGLOBIN: HGB: 12.5 g/dL (ref 12.0–16.0)

## 2014-07-20 LAB — HEPATIC FUNCTION PANEL A (ARMC)
Albumin: 2.2 g/dL — ABNORMAL LOW (ref 3.4–5.0)
Alkaline Phosphatase: 94 U/L
BILIRUBIN TOTAL: 0.6 mg/dL (ref 0.2–1.0)
Bilirubin, Direct: 0.2 mg/dL (ref 0.0–0.2)
SGOT(AST): 82 U/L — ABNORMAL HIGH (ref 15–37)
SGPT (ALT): 37 U/L
Total Protein: 5.1 g/dL — ABNORMAL LOW (ref 6.4–8.2)

## 2014-07-20 LAB — STOOL CULTURE

## 2014-11-18 ENCOUNTER — Emergency Department: Payer: Self-pay | Admitting: Emergency Medicine

## 2014-11-18 LAB — DRUG SCREEN, URINE
Amphetamines, Ur Screen: NEGATIVE
BENZODIAZEPINE, UR SCRN: POSITIVE
Barbiturates, Ur Screen: NEGATIVE
COCAINE METABOLITE, UR ~~LOC~~: NEGATIVE
Cannabinoid 50 Ng, Ur ~~LOC~~: NEGATIVE
MDMA (ECSTASY) UR SCREEN: NEGATIVE
METHADONE, UR SCREEN: NEGATIVE
Opiate, Ur Screen: NEGATIVE
Phencyclidine (PCP) Ur S: NEGATIVE
Tricyclic, Ur Screen: NEGATIVE

## 2014-11-18 LAB — SALICYLATE LEVEL: Salicylates, Serum: <4 mg/dL

## 2014-11-18 LAB — URINALYSIS, COMPLETE
BILIRUBIN, UR: NEGATIVE
Bacteria: NONE SEEN
Blood: NEGATIVE
Glucose,UR: NEGATIVE mg/dL (ref 0–75)
Ketone: NEGATIVE
LEUKOCYTE ESTERASE: NEGATIVE
NITRITE: NEGATIVE
PH: 6 (ref 4.5–8.0)
Protein: NEGATIVE
RBC,UR: NONE SEEN /HPF (ref 0–5)
Specific Gravity: 1.001 (ref 1.003–1.030)
Squamous Epithelial: <1
WBC UR: NONE SEEN /HPF (ref 0–5)

## 2014-11-18 LAB — COMPREHENSIVE METABOLIC PANEL
ANION GAP: 10 (ref 7–16)
Albumin: 4.2 g/dL
Alkaline Phosphatase: 89 U/L
BUN: 6 mg/dL
Bilirubin,Total: 0.8 mg/dL
CALCIUM: 9.2 mg/dL
Chloride: 102 mmol/L
Co2: 22 mmol/L
Creatinine: 0.46 mg/dL
Glucose: 90 mg/dL
POTASSIUM: 3.8 mmol/L
SGOT(AST): 48 U/L — ABNORMAL HIGH
SGPT (ALT): 17 U/L
Sodium: 134 mmol/L — ABNORMAL LOW
Total Protein: 7.7 g/dL

## 2014-11-18 LAB — CBC
HCT: 45.8 % (ref 35.0–47.0)
HGB: 15.2 g/dL (ref 12.0–16.0)
MCH: 30.5 pg (ref 26.0–34.0)
MCHC: 33.2 g/dL (ref 32.0–36.0)
MCV: 92 fL (ref 80–100)
PLATELETS: 160 10*3/uL (ref 150–440)
RBC: 4.99 10*6/uL (ref 3.80–5.20)
RDW: 14.1 % (ref 11.5–14.5)
WBC: 7.8 10*3/uL (ref 3.6–11.0)

## 2014-11-18 LAB — ETHANOL: ETHANOL LVL: 157 mg/dL

## 2014-11-18 LAB — ACETAMINOPHEN LEVEL: Acetaminophen: <10 ug/mL

## 2014-11-23 ENCOUNTER — Emergency Department: Payer: Self-pay | Admitting: Emergency Medicine

## 2014-11-23 LAB — COMPREHENSIVE METABOLIC PANEL
ALK PHOS: 76 U/L
ANION GAP: 10 (ref 7–16)
AST: 30 U/L
Albumin: 3.7 g/dL
BUN: 12 mg/dL
Bilirubin,Total: 0.9 mg/dL
CALCIUM: 8.4 mg/dL — AB
CO2: 23 mmol/L
Chloride: 102 mmol/L
Creatinine: 0.5 mg/dL
EGFR (African American): >60
EGFR (Non-African Amer.): >60
Glucose: 105 mg/dL — ABNORMAL HIGH
POTASSIUM: 3.7 mmol/L
SGPT (ALT): 13 U/L — ABNORMAL LOW
SODIUM: 135 mmol/L
Total Protein: 6.9 g/dL

## 2014-11-23 LAB — ETHANOL: Ethanol: 123 mg/dL

## 2014-11-23 LAB — CBC WITH DIFFERENTIAL/PLATELET
BASOS ABS: 0 10*3/uL (ref 0.0–0.1)
Basophil %: 0.8 %
Eosinophil #: 0 10*3/uL (ref 0.0–0.7)
Eosinophil %: 0.5 %
HCT: 41.7 % (ref 35.0–47.0)
HGB: 13.9 g/dL (ref 12.0–16.0)
LYMPHS ABS: 2 10*3/uL (ref 1.0–3.6)
Lymphocyte %: 35.5 %
MCH: 31 pg (ref 26.0–34.0)
MCHC: 33.4 g/dL (ref 32.0–36.0)
MCV: 93 fL (ref 80–100)
MONO ABS: 0.4 x10 3/mm (ref 0.2–0.9)
Monocyte %: 7.2 %
Neutrophil #: 3.2 10*3/uL (ref 1.4–6.5)
Neutrophil %: 56 %
PLATELETS: 179 10*3/uL (ref 150–440)
RBC: 4.49 10*6/uL (ref 3.80–5.20)
RDW: 13.7 % (ref 11.5–14.5)
WBC: 5.7 10*3/uL (ref 3.6–11.0)

## 2014-11-23 LAB — LIPASE, BLOOD: Lipase: 25 U/L

## 2014-11-24 ENCOUNTER — Encounter (HOSPITAL_COMMUNITY): Payer: Self-pay | Admitting: Emergency Medicine

## 2014-11-24 ENCOUNTER — Emergency Department (HOSPITAL_COMMUNITY): Payer: Medicaid Other

## 2014-11-24 ENCOUNTER — Ambulatory Visit (HOSPITAL_COMMUNITY)
Admission: AD | Admit: 2014-11-24 | Discharge: 2014-11-24 | Disposition: A | Discharge status: Home / Self Care | Payer: Self-pay | Attending: Psychiatry | Admitting: Psychiatry

## 2014-11-24 ENCOUNTER — Emergency Department (HOSPITAL_COMMUNITY)
Admission: EM | Admit: 2014-11-24 | Discharge: 2014-11-26 | Disposition: A | Discharge status: Home / Self Care | Payer: Medicaid Other | Attending: Emergency Medicine | Admitting: Emergency Medicine

## 2014-11-24 DIAGNOSIS — Z3202 Encounter for pregnancy test, result negative: Secondary | ICD-10-CM | POA: Insufficient documentation

## 2014-11-24 DIAGNOSIS — F1010 Alcohol abuse, uncomplicated: Secondary | ICD-10-CM | POA: Diagnosis not present

## 2014-11-24 DIAGNOSIS — Z8619 Personal history of other infectious and parasitic diseases: Secondary | ICD-10-CM | POA: Insufficient documentation

## 2014-11-24 DIAGNOSIS — Z91040 Latex allergy status: Secondary | ICD-10-CM | POA: Diagnosis not present

## 2014-11-24 DIAGNOSIS — F39 Unspecified mood [affective] disorder: Secondary | ICD-10-CM | POA: Diagnosis not present

## 2014-11-24 DIAGNOSIS — Z8709 Personal history of other diseases of the respiratory system: Secondary | ICD-10-CM | POA: Diagnosis not present

## 2014-11-24 DIAGNOSIS — F1310 Sedative, hypnotic or anxiolytic abuse, uncomplicated: Secondary | ICD-10-CM | POA: Insufficient documentation

## 2014-11-24 DIAGNOSIS — Z8679 Personal history of other diseases of the circulatory system: Secondary | ICD-10-CM | POA: Insufficient documentation

## 2014-11-24 DIAGNOSIS — Z87448 Personal history of other diseases of urinary system: Secondary | ICD-10-CM | POA: Insufficient documentation

## 2014-11-24 DIAGNOSIS — F1020 Alcohol dependence, uncomplicated: Secondary | ICD-10-CM | POA: Diagnosis not present

## 2014-11-24 DIAGNOSIS — Z720 Tobacco use: Secondary | ICD-10-CM | POA: Insufficient documentation

## 2014-11-24 DIAGNOSIS — Z8659 Personal history of other mental and behavioral disorders: Secondary | ICD-10-CM | POA: Diagnosis not present

## 2014-11-24 DIAGNOSIS — Y909 Presence of alcohol in blood, level not specified: Secondary | ICD-10-CM | POA: Diagnosis not present

## 2014-11-24 DIAGNOSIS — F329 Major depressive disorder, single episode, unspecified: Secondary | ICD-10-CM | POA: Insufficient documentation

## 2014-11-24 DIAGNOSIS — F411 Generalized anxiety disorder: Secondary | ICD-10-CM

## 2014-11-24 LAB — CBC
HCT: 42.6 % (ref 36.0–46.0)
Hemoglobin: 14.5 g/dL (ref 12.0–15.0)
MCH: 31.8 pg (ref 26.0–34.0)
MCHC: 34 g/dL (ref 30.0–36.0)
MCV: 93.4 fL (ref 78.0–100.0)
Platelets: 189 10*3/uL (ref 150–400)
RBC: 4.56 MIL/uL (ref 3.87–5.11)
RDW: 13.7 % (ref 11.5–15.5)
WBC: 7.5 10*3/uL (ref 4.0–10.5)

## 2014-11-24 LAB — COMPREHENSIVE METABOLIC PANEL
ALT: 15 U/L (ref 0–35)
AST: 37 U/L (ref 0–37)
Albumin: 4.1 g/dL (ref 3.5–5.2)
Alkaline Phosphatase: 88 U/L (ref 39–117)
Anion gap: 12 (ref 5–15)
BUN: 11 mg/dL (ref 6–23)
CALCIUM: 8.7 mg/dL (ref 8.4–10.5)
CO2: 23 mmol/L (ref 19–32)
CREATININE: 0.61 mg/dL (ref 0.50–1.10)
Chloride: 100 mmol/L (ref 96–112)
Glucose, Bld: 111 mg/dL — ABNORMAL HIGH (ref 70–99)
POTASSIUM: 3.4 mmol/L — AB (ref 3.5–5.1)
Sodium: 135 mmol/L (ref 135–145)
Total Bilirubin: 1.5 mg/dL — ABNORMAL HIGH (ref 0.3–1.2)
Total Protein: 7.6 g/dL (ref 6.0–8.3)

## 2014-11-24 LAB — ETHANOL: ALCOHOL ETHYL (B): 22 mg/dL — AB (ref 0–9)

## 2014-11-24 LAB — RAPID URINE DRUG SCREEN, HOSP PERFORMED
Amphetamines: NOT DETECTED
BARBITURATES: NOT DETECTED
Benzodiazepines: POSITIVE — AB
Cocaine: NOT DETECTED
OPIATES: NOT DETECTED
Tetrahydrocannabinol: NOT DETECTED

## 2014-11-24 LAB — ACETAMINOPHEN LEVEL

## 2014-11-24 LAB — POC URINE PREG, ED: Preg Test, Ur: NEGATIVE

## 2014-11-24 LAB — SALICYLATE LEVEL: Salicylate Lvl: <4 mg/dL (ref 2.8–20.0)

## 2014-11-24 MED ORDER — LORAZEPAM 1 MG PO TABS
0.0000 mg | ORAL_TABLET | Freq: Four times a day (QID) | ORAL | Status: DC
  Administered 2014-11-24: 1 mg via ORAL
  Administered 2014-11-24: 2 mg via ORAL
  Administered 2014-11-25 – 2014-11-26 (×4): 1 mg via ORAL
  Filled 2014-11-24: qty 1
  Filled 2014-11-24: qty 2
  Filled 2014-11-24 (×4): qty 1

## 2014-11-24 MED ORDER — IBUPROFEN 200 MG PO TABS: 600.0000 mg | ORAL_TABLET | Freq: Three times a day (TID) | ORAL | Status: DC | PRN

## 2014-11-24 MED ORDER — ALUM & MAG HYDROXIDE-SIMETH 200-200-20 MG/5ML PO SUSP: 30.0000 mL | ORAL | Status: DC | PRN

## 2014-11-24 MED ORDER — THIAMINE HCL 100 MG/ML IJ SOLN: 100.0000 mg | Freq: Every day | INTRAMUSCULAR | Status: DC

## 2014-11-24 MED ORDER — VITAMIN B-1 100 MG PO TABS
100.0000 mg | ORAL_TABLET | Freq: Every day | ORAL | Status: DC
  Administered 2014-11-24 – 2014-11-26 (×3): 100 mg via ORAL
  Filled 2014-11-24 (×3): qty 1

## 2014-11-24 MED ORDER — LORAZEPAM 1 MG PO TABS: 0.0000 mg | ORAL_TABLET | Freq: Two times a day (BID) | ORAL | Status: DC

## 2014-11-24 NOTE — ED Notes (Signed)
Patient and belongings wanded

## 2014-11-24 NOTE — Discharge Instructions (Signed)
Medically cleared to go back to [facility].  Alcohol Use Disorder Alcohol use disorder is a mental disorder. It is not a one-time incident of heavy drinking. Alcohol use disorder is the excessive and uncontrollable use of alcohol over time that leads to problems with functioning in one or more areas of daily living. People with this disorder risk harming themselves and others when they drink to excess. Alcohol use disorder also can cause other mental disorders, such as mood and anxiety disorders, and serious physical problems. People with alcohol use disorder often misuse other drugs.  Alcohol use disorder is common and widespread. Some people with this disorder drink alcohol to cope with or escape from negative life events. Others drink to relieve chronic pain or symptoms of mental illness. People with a family history of alcohol use disorder are at higher risk of losing control and using alcohol to excess.  SYMPTOMS  Signs and symptoms of alcohol use disorder may include the following:   Consumption ofalcohol inlarger amounts or over a longer period of time than intended.  Multiple unsuccessful attempts to cutdown or control alcohol use.   A great deal of time spent obtaining alcohol, using alcohol, or recovering from the effects of alcohol (hangover).  A strong desire or urge to use alcohol (cravings).   Continued use of alcohol despite problems at work, school, or home because of alcohol use.   Continued use of alcohol despite problems in relationships because of alcohol use.  Continued use of alcohol in situations when it is physically hazardous, such as driving a car.  Continued use of alcohol despite awareness of a physical or psychological problem that is likely related to alcohol use. Physical problems related to alcohol use can involve the brain, heart, liver, stomach, and intestines. Psychological problems related to alcohol use include intoxication, depression,  anxiety, psychosis, delirium, and dementia.   The need for increased amounts of alcohol to achieve the same desired effect, or a decreased effect from the consumption of the same amount of alcohol (tolerance).  Withdrawal symptoms upon reducing or stopping alcohol use, or alcohol use to reduce or avoid withdrawal symptoms. Withdrawal symptoms include:  Racing heart.  Hand tremor.  Difficulty sleeping.  Nausea.  Vomiting.  Hallucinations.  Restlessness.  Seizures. DIAGNOSIS Alcohol use disorder is diagnosed through an assessment by your health care provider. Your health care provider may start by asking three or four questions to screen for excessive or problematic alcohol use. To confirm a diagnosis of alcohol use disorder, at least two symptoms must be present within a 79-monthperiod. The severity of alcohol use disorder depends on the number of symptoms:  Mild--two or three.  Moderate--four or five.  Severe--six or more. Your health care provider may perform a physical exam or use results from lab tests to see if you have physical problems resulting from alcohol use. Your health care provider may refer you to a mental health professional for evaluation. TREATMENT  Some people with alcohol use disorder are able to reduce their alcohol use to low-risk levels. Some people with alcohol use disorder need to quit drinking alcohol. When necessary, mental health professionals with specialized training in substance use treatment can help. Your health care provider can help you decide how severe your alcohol use disorder is and what type of treatment you need. The following forms of treatment are available:   Detoxification. Detoxification involves the use of prescription medicines to prevent alcohol withdrawal symptoms in the first week after quitting.  This is important for people with a history of symptoms of withdrawal and for heavy drinkers who are likely to have withdrawal symptoms.  Alcohol withdrawal can be dangerous and, in severe cases, cause death. Detoxification is usually provided in a hospital or in-patient substance use treatment facility.  Counseling or talk therapy. Talk therapy is provided by substance use treatment counselors. It addresses the reasons people use alcohol and ways to keep them from drinking again. The goals of talk therapy are to help people with alcohol use disorder find healthy activities and ways to cope with life stress, to identify and avoid triggers for alcohol use, and to handle cravings, which can cause relapse.  Medicines.Different medicines can help treat alcohol use disorder through the following actions:  Decrease alcohol cravings.  Decrease the positive reward response felt from alcohol use.  Produce an uncomfortable physical reaction when alcohol is used (aversion therapy).  Support groups. Support groups are run by people who have quit drinking. They provide emotional support, advice, and guidance. These forms of treatment are often combined. Some people with alcohol use disorder benefit from intensive combination treatment provided by specialized substance use treatment centers. Both inpatient and outpatient treatment programs are available. Document Released: 09/20/2004 Document Revised: 12/28/2013 Document Reviewed: 11/20/2012 [facility] Patient Information 2015 [place], [place]. This information is not intended to replace advice given to you by your health care provider. Make sure you discuss any questions you have with your health care provider.

## 2014-11-24 NOTE — ED Provider Notes (Signed)
CSN: [id number]     Arrival date & time 11/24/14  53 History   First MD Initiated Contact with Patient 11/24/14 1412     Chief Complaint  Patient presents with  . detox    The history is provided by the patient. No language interpreter was used.  This chart was scribed for non-physician practitioner Lucien Mons, PA-C, working with Debby Freiberg, MD, by Thea Alken, ED Scribe. This patient was seen in room WTR3/WLPT3 and the patient's care was started at 4:39 PM.  Kristine Macias is a 29 y.o. female who presents to the Emergency Department for medical clearance. Pt was sent from [facility], where she is receiving an alcohol detox, and has placement there. Pt reports having a cold at this time and when she coughs feels short of breath. She reports hx of a bad lung and states her last pneumothorax was 1 year ago.  Denies SI/HI.  Past Medical History  Diagnosis Date  . Anxiety   . Chlamydia 2007  . Irregular heart beat 2010    wt/diet related after evaluation  . Renal disorder   . Pneumothorax, spontaneous, tension    Past Surgical History  Procedure Laterality Date  . Cesarean section    . Chest tube insertion     Family History  Problem Relation Age of Onset  . Anesthesia problems Neg Hx   . Thyroid disease Mother   . Cancer Father   . Stroke Other    History  Substance Use Topics  . Smoking status: Current Every Day Smoker -- 0.25 packs/day    Types: Cigarettes  . Smokeless tobacco: Never Used  . Alcohol Use: No   OB History    Gravida Para Term Preterm AB TAB SAB Ectopic Multiple Living   4 2 2  0 2 1 1  0 0 2     Review of Systems  HENT: Positive for congestion.   Respiratory: Positive for cough and shortness of breath.   All other systems reviewed and are negative.  Allergies  Sulfa antibiotics; Tetracyclines & related; and Latex  Home Medications   Prior to Admission medications   Medication Sig Start Date End Date Taking? Authorizing Provider   HYDROcodone-acetaminophen (NORCO) 5-325 MG per tablet Take 1-2 tablets by mouth every 4 (four) hours as needed for pain. Patient not taking: Reported on 11/24/2014 11/24/12   Hazel Sams, PA-C   BP 132/91 mmHg  Pulse 98  Temp(Src) 98.3 F (36.8 C) (Oral)  Resp 16  SpO2 98%  LMP 11/24/2014 Physical Exam  Constitutional: She is oriented to person, place, and time. She appears well-developed and well-nourished. No distress.  HENT:  Head: Normocephalic and atraumatic.  Mouth/Throat: Oropharynx is clear and moist.  Nasal congestion.  Eyes: Conjunctivae and EOM are normal.  Neck: Normal range of motion. Neck supple.  Cardiovascular: Normal rate, regular rhythm and normal heart sounds.   Pulmonary/Chest: Effort normal and breath sounds normal. No respiratory distress. She has no wheezes. She has no rales. She exhibits no tenderness.  Musculoskeletal: Normal range of motion. She exhibits no edema.  Neurological: She is alert and oriented to person, place, and time. No sensory deficit.  Skin: Skin is warm and dry.  Psychiatric: Her behavior is normal. She exhibits a depressed mood. She expresses no homicidal and no suicidal ideation.  Nursing note and vitals reviewed.   ED Course  Procedures (including critical care time) DIAGNOSTIC STUDIES: Oxygen Saturation is 98% on RA, normal by my interpretation.  COORDINATION OF CARE: 4:39 PM- Pt advised of plan for treatment which includes a CXR and pt agrees.  Labs Review Labs Reviewed  COMPREHENSIVE METABOLIC PANEL - Abnormal; Notable for the following:    Potassium 3.4 (*)    Glucose, Bld 111 (*)    Total Bilirubin 1.5 (*)    All other components within normal limits  ETHANOL - Abnormal; Notable for the following:    Alcohol, Ethyl (B) 22 (*)    All other components within normal limits  ACETAMINOPHEN LEVEL - Abnormal; Notable for the following:    Acetaminophen (Tylenol), Serum <10.0 (*)    All other components within normal limits   CBC  SALICYLATE LEVEL  URINE RAPID DRUG SCREEN (HOSP PERFORMED)  POC URINE PREG, ED    Imaging Review Dg Chest 2 View  11/24/2014   CLINICAL DATA:  Medical clearance for alcohol detox indication  EXAM: CHEST  2 VIEW  COMPARISON:  None.  FINDINGS: The heart size and mediastinal contours are within normal limits. Both lungs are clear. The visualized skeletal structures are unremarkable.  IMPRESSION: No active cardiopulmonary disease.   Electronically Signed   By: Kathreen Devoid   On: 11/24/2014 15:42     EKG Interpretation None      MDM   Final diagnoses:  Alcohol abuse   NAD. AF VSS. Lungs clear. Chest x-ray negative. Medically cleared to return to behavioral health Hospital.  Addendum: [facility] requests pt be tranferred in AM. Will move to [facility].  I personally performed the services described in this documentation, which was scribed in my presence. The recorded information has been reviewed and is accurate.  Carman Ching, PA-C 11/24/14 [place], PA-C 11/24/14 [place], MD 11/25/14 [phone]

## 2014-11-24 NOTE — BH Assessment (Signed)
Writer informed the Charge Nurse Erline Levine) that the patient is a walk in [facility].  Per Reginold Agent, NP - patient will be sent to [facility] for medical clearance due to excessive alcohol use.  Once medically cleared patient will be seen by the Psychiatrist for a final disposition.    Patient reports excessive alcohol substance abuse.  Patient reports that she has drunk so much that she can no longer hold any food down.  Patient reports that she has been medically admitted for alcohol detox.  Patient reports a history of seizures.  Patient reports that her last seizure was last year.  Patient reports that she has been drinking a 12 pack of beer daily for the past year.    Patient reports that today is the anniversary of the murder of her 76 year old brother.  Patient reports that her brother was murdered in 50.  Patient reports that her father died in 11/13/12.  Patient reports that she has been drinking to deal with the anxiety of his death.

## 2014-11-24 NOTE — ED Notes (Signed)
Pt sent from [facility] for medical clearance for ETOH detox. Per Tech with pt has a bed at [facility].  Pt states that drink was couple hours ago.  Pt on average of 12 per day. Pt denies Si or HI

## 2014-11-24 NOTE — ED Notes (Signed)
Patient in the restroom.  I will collect the patient labs when she returns.

## 2014-11-24 NOTE — ED Notes (Signed)
Spoke to [employer] from adult unit [facility], states pt is not accepted until tomorrow and that pt is to go to [facility]

## 2014-11-24 NOTE — ED Notes (Signed)
Made pt aware the next time she goes to bathroom we need urine specimen.

## 2014-11-24 NOTE — ED Notes (Signed)
Pt presents to [facility] with anxiety, chills and tremors. Called EDP for orders. Pt reports drinking 12 beer a day with last drink today. Pt has a hx of seizure with withdrawals. She [facility] go to [facility] for treatment. Safety maintained.

## 2014-11-24 NOTE — Progress Notes (Addendum)
Pt referral was faxed to the following hospitals with potential bed: [facility], [facility], [place], [place], [place], and [place].  [facility] - per [facility], fax referral [facility] - per Tawanna Sat, fax referral. [place] - left voicemail [place], fax referral, we have female beds not sure if low acuity. [facility] - per Eustaquio Maize, fax referral. Referral re-faxed a few times. Per Hilda Blades, try fax at [phone]. Referral faxed successfully.  [facility] - 2 adult beds. [facility] - fax referral.  At Capacity: [facility] - no female low acuity beds. 1 female low acuity. [place] - no beds available [place], [place] Disposition staff 11/24/2014 6:16 PM

## 2014-11-24 NOTE — BH Assessment (Addendum)
Assessment Note Patient is a 29 year old white female that reports anxiety because today is the anniversary of the murder of her 44 year old brother.  Patient reports that she witnessed her brother get murdered by his best friend. Patient reports that her brother was murdered in 65.  Patient reports that her father died in November 04, 2012 of stage 4 lung cancer.  Patient reports that she has been drinking to deal with the increased level of anxiety regarding their deaths.     Patient reports excessive alcohol substance abuse.  Patient reports that she has drunk so much that she can no longer hold any food down.  Patient reports that she has been medically admitted for alcohol detox in March and November 2015 at [facility].  Patient reports a history of seizures.  Patient reports that her last seizure was in Nov 04, 2013.    Patient reports that she has been drinking a 12 pack of beer daily for the past year.   Patient reports that she had her first drink at the age of 24.  Patient reports that she is experiencing withdrawal symptoms.  Patient denies prior substance abuse detox or treatments.  Patient reports only going to the ER at [facility] for detox.    Patient denies prior psychiatric hospitalization.  Patient denies medication management.  Patient denies physical, sexual or emotional abuse.  Patient denies SI/HI/Psychosis.      Axis I: Anxiety Disorder NOS Axis II: Deferred Axis III:  Past Medical History  Diagnosis Date  . Anxiety   . Chlamydia 11-04-2005  . Irregular heart beat 2008-11-04    wt/diet related after evaluation  . Renal disorder   . Pneumothorax, spontaneous, tension    Axis IV: economic problems, occupational problems, other psychosocial or environmental problems, problems related to social environment, problems with access to health care services and problems with primary support group Axis V: 31-40 impairment in reality testing  Past Medical History:  Past Medical History  Diagnosis Date   . Anxiety   . Chlamydia November 04, 2005  . Irregular heart beat Nov 04, 2008    wt/diet related after evaluation  . Renal disorder   . Pneumothorax, spontaneous, tension     Past Surgical History  Procedure Laterality Date  . Cesarean section    . Chest tube insertion      Family History:  Family History  Problem Relation Age of Onset  . Anesthesia problems Neg Hx   . Thyroid disease Mother   . Cancer Father   . Stroke Other     Social History:  reports that she has been smoking Cigarettes.  She has been smoking about 0.25 packs per day. She has never used smokeless tobacco. She reports that she does not drink alcohol or use illicit drugs.  Additional Social History:  Alcohol / Drug Use History of alcohol / drug use?: Yes Negative Consequences of Use: Financial, Work / Youth worker, Personal relationships Withdrawal Symptoms: Fever / Chills, Irritability, Nausea / Vomiting, Patient aware of relationship between substance abuse and physical/medical complications, Tingling, Weakness Substance #1 Name of Substance 1: Alcohol 1 - Age of First Use: 13 1 - Amount (size/oz): 12 pack of beer  1 - Frequency: Daily  1 - Duration: 1 year  1 - Last Use / Amount: Today, unable to remember   CIWA:   COWS:    PATIENT STRENGTHS: (choose at least two) Ability for insight Average or above average intelligence Capable of independent living Communication skills General fund of knowledge  Allergies:  Allergies  Allergen Reactions  . Sulfa Antibiotics Anaphylaxis  . Tetracyclines & Related Anaphylaxis  . Latex Hives and Itching    Home Medications:  (Not in a hospital admission)  OB/GYN Status:  No LMP recorded.  General Assessment Data Location of Assessment: [facility] Services (Walk In ) Is this a Tele or Face-to-Face Assessment?: Face-to-Face Is this an Initial Assessment or a Re-assessment for this encounter?: Initial Assessment Living Arrangements: Children (Lives with boyfriend and 5  and 7yo children) Can pt return to current living arrangement?: Yes Admission Status: Voluntary Is patient capable of signing voluntary admission?: Yes Transfer from: Home Referral Source: Self/Family/Friend  Medical Screening Exam ([place]) Medical Exam completed: Yes  [place] Living Arrangements: Children (Lives with boyfriend and 5 and 7yo children) Name of Psychiatrist: NA Name of Therapist: NA  Education Status Is patient currently in school?: No Current Grade: NA Highest grade of school patient has completed: NA Name of school: NA Contact person: NA  Risk to self with the past 6 months Suicidal Ideation: No Suicidal Intent: No Is patient at risk for suicide?: No Suicidal Plan?: No Access to Means: No What has been your use of drugs/alcohol within the last 12 months?: Alcohol Previous Attempts/Gestures: No How many times?: 0 Other Self Harm Risks: None Reported Triggers for Past Attempts:  (None Reported) Intentional Self Injurious Behavior: None Family Suicide History: No Recent stressful life event(s): Conflict (Comment), Job Loss, Financial Problems Persecutory voices/beliefs?: No Depression: Yes Depression Symptoms: Despondent, Isolating, Fatigue, Guilt, Loss of interest in usual pleasures, Feeling worthless/self pity Substance abuse history and/or treatment for substance abuse?: Yes Suicide prevention information given to non-admitted patients: Not applicable  Risk to Others within the past 6 months Homicidal Ideation: No Thoughts of Harm to Others: No Current Homicidal Intent: No Current Homicidal Plan: No Access to Homicidal Means: No Identified Victim: None Reported History of harm to others?: No Assessment of Violence: None Noted Violent Behavior Description: None Reported Does patient have access to weapons?: No Criminal Charges Pending?: No Does patient have a court date: No  Psychosis Hallucinations: None  noted Delusions: None noted  Mental Status Report Appearance/Hygiene: Disheveled Eye Contact: Fair Motor Activity: Freedom of movement Speech: Logical/coherent Level of Consciousness: Alert Mood: Depressed Affect: Anxious Anxiety Level: Minimal Thought Processes: Coherent, Relevant Judgement: Unimpaired Orientation: Place, Person, Time, Situation Obsessive Compulsive Thoughts/Behaviors: None  Cognitive Functioning Concentration: Decreased Memory: Recent Intact, Remote Intact IQ: Average Insight: Fair Impulse Control: Fair Appetite: Poor Weight Loss: 0 Weight Gain: 0 Sleep: Decreased Total Hours of Sleep: 4 Vegetative Symptoms: Staying in bed, Decreased grooming  ADLScreening [facility]) Patient's cognitive ability adequate to safely complete daily activities?: Yes Patient able to express need for assistance with ADLs?: No Independently performs ADLs?: Yes (appropriate for developmental age)  Prior Inpatient Therapy Prior Inpatient Therapy: No Prior Therapy Dates: NA Prior Therapy Facilty/Provider(s): NA Reason for Treatment: NA  Prior Outpatient Therapy Prior Outpatient Therapy: Yes Prior Therapy Dates: 1996 Prior Therapy Facilty/Provider(s): Unable to remember the name  Reason for Treatment: Med Mgt and OPT  ADL Screening (condition at time of admission) Patient's cognitive ability adequate to safely complete daily activities?: Yes Is the patient deaf or have difficulty hearing?: No Does the patient have difficulty concentrating, remembering, or making decisions?: No Patient able to express need for assistance with ADLs?: No Does the patient have difficulty dressing or bathing?: No Independently performs ADLs?: Yes (appropriate for developmental age) Does the  patient have difficulty walking or climbing stairs?: No Weakness of Legs: None Weakness of Arms/Hands: None  Home Assistive Devices/Equipment Home Assistive Devices/Equipment: None     Abuse/Neglect Assessment (Assessment to be complete while patient is alone) Physical Abuse: Denies Verbal Abuse: Denies Sexual Abuse: Denies Exploitation of patient/patient's resources: Denies Self-Neglect: Denies Values / Beliefs Cultural Requests During Hospitalization: None Spiritual Requests During Hospitalization: None Consults Spiritual Care Consult Needed: No Social Work Consult Needed: No Regulatory affairs officer (For Healthcare) Does patient have an advance directive?: No    Additional Information 1:1 In Past 12 Months?: No CIRT Risk: No Elopement Risk: No Does patient have medical clearance?: Yes     Disposition: Per Reginold Agent, NP -patient will be sent to [facility] for medical clearance due to excessive alcohol use.  Once medically cleared patient will be seen by the Psychiatrist for a final disposition.   Disposition Initial Assessment Completed for this Encounter: Yes Disposition of Patient: Other dispositions Other disposition(s):  (Pending psych dispo )  Graciella Freer [place] 11/24/2014 2:04 PM

## 2014-11-24 NOTE — BH Assessment (Signed)
Per Patriciaann Clan, PA pt meets inpt detox but not dual dx. TTS to refer to [facility] and [facility]. [facility] will send referral.   Lear Ng, [facility] Triage Specialist 11/24/2014 9:09 PM

## 2014-11-24 NOTE — BH Assessment (Signed)
Per [place] at [facility]. Pt has been accepted to [facility], to arrive at anytime. She has been accepted by Dr. Hassell Done and report can be called to [phone].  While attempting to inform PA, this writer was told Earnest Bailey called back and said pt was not accepted as she is not under IVC.   TTS will continue to seek placement.    Lear Ng, [facility] Triage Specialist 11/24/2014 9:32 PM

## 2014-11-24 NOTE — Progress Notes (Signed)
pcp is [facility]  [address] [place], [place] [address] [phone]

## 2014-11-25 DIAGNOSIS — F1020 Alcohol dependence, uncomplicated: Secondary | ICD-10-CM | POA: Diagnosis not present

## 2014-11-25 DIAGNOSIS — Y909 Presence of alcohol in blood, level not specified: Secondary | ICD-10-CM | POA: Diagnosis not present

## 2014-11-25 DIAGNOSIS — F39 Unspecified mood [affective] disorder: Secondary | ICD-10-CM | POA: Diagnosis not present

## 2014-11-25 NOTE — ED Notes (Signed)
Patient calm and cooperative at this time. Respirations equal and unlabored, skin warm and dry. NAD. Writer introduce self to patient.  Patient denies SI, HI and AVH at this time. Patient reports that he wants detox. Plan of care discuss with patient. Patient voices no complaints or concerns at this time.Encouragement and support provided and safety maintain. Q 15 min safety checks remain in place. No change in prior assessment.

## 2014-11-25 NOTE — Consult Note (Signed)
[facility] Consult   Reason for Consult: Alcohol use disorder, mood disorder,  Referring Physician:  edp Patient Identification: Kristine Macias MRN:  [id number] Principal Diagnosis: Alcohol use disorder, severe, dependence Diagnosis:   Patient Active Problem List   Diagnosis Date Noted  . Alcohol use disorder, severe, dependence [F10.20] 11/25/2014    Priority: High  . Pelvic pain in pregnancy [O26.899, R10.2] 04/10/2012  . Vaginitis [N76.0] 04/10/2012  . Adnexal mass [N94.9] 04/10/2012    Total Time spent with patient: 1 hour  Subjective:   Kristine Macias is a 29 y.o. female patient admitted with Alcohol use disorder, severe, Mood disorder  HPI:  Caucasian female, 29 years old was evaluated for Alcohol use disorder.  Patient walked to [facility] yesterday seeking Alcohol detox treatment.  She reported drinking a case of beer daily and also reported suffering from depression and anxiety.  Patient reports that she usually drink more Alcohol than take her medications.  She admitted to two previous detox treatment and stated that her longest period of sobriety has been a period of 6 months.   She also reports a hx of Alcohol withdrawal  Seizures.    Patient reports that yesterday was  the anniversary of the murder of her 68 year old brother who  was murdered in 32. Patient reports that her father died in 10/10/2012. She  reports that she has been drinking to deal with the anxiety of these  Deaths for two years.  She was diagnosed with depression and anxiety at age 70 and 41 respectively and was on Xanax for 4 years.  Patient denies SI/HI/AVH but reports extreme anxiety related to the deaths in her family.  We will keep patient overnight and reevaluate in am.  We will continue to treat her anxiety and continue detox treatment with our Ativan protocol.  HPI Elements:   Location:  Alcohol use disorder, anxiety disorder. Quality:  severe with c/o extreme anxiety and  drinking. Severity:  severe. Timing:  Acute. Duration:  two years . Context:  seeking detox treatment and anxiety treatment..  Past Medical History:  Past Medical History  Diagnosis Date  . Anxiety   . Chlamydia 10/10/2005  . Irregular heart beat October 10, 2008    wt/diet related after evaluation  . Renal disorder   . Pneumothorax, spontaneous, tension     Past Surgical History  Procedure Laterality Date  . Cesarean section    . Chest tube insertion     Family History:  Family History  Problem Relation Age of Onset  . Anesthesia problems Neg Hx   . Thyroid disease Mother   . Cancer Father   . Stroke Other    Social History:  History  Alcohol Use No     History  Drug Use No    History   Social History  . Marital Status: Single    Spouse Name: N/A  . Number of Children: N/A  . Years of Education: N/A   Social History Main Topics  . Smoking status: Current Every Day Smoker -- 0.25 packs/day    Types: Cigarettes  . Smokeless tobacco: Never Used  . Alcohol Use: No  . Drug Use: No  . Sexual Activity: Yes    Birth Control/ Protection: None   Other Topics Concern  . None   Social History Narrative   Additional Social History:  Allergies:   Allergies  Allergen Reactions  . Sulfa Antibiotics Anaphylaxis  . Tetracyclines & Related Anaphylaxis  . Latex Hives and Itching    Labs:  Results for orders placed or performed during the hospital encounter of 11/24/14 (from the past 48 hour(s))  Drug screen panel, emergency     Status: Abnormal   Collection Time: 11/24/14  2:09 PM  Result Value Ref Range   Opiates NONE DETECTED NONE DETECTED   Cocaine NONE DETECTED NONE DETECTED   Benzodiazepines POSITIVE (A) NONE DETECTED   Amphetamines NONE DETECTED NONE DETECTED   Tetrahydrocannabinol NONE DETECTED NONE DETECTED   Barbiturates NONE DETECTED NONE DETECTED    Comment:        DRUG SCREEN FOR MEDICAL PURPOSES ONLY.  IF CONFIRMATION IS  NEEDED FOR ANY PURPOSE, NOTIFY LAB WITHIN 5 DAYS.        LOWEST DETECTABLE LIMITS FOR URINE DRUG SCREEN Drug Class       Cutoff (ng/mL) Amphetamine      1000 Barbiturate      200 Benzodiazepine   784 Tricyclics       696 Opiates          300 Cocaine          300 THC              50   CBC     Status: None   Collection Time: 11/24/14  2:34 PM  Result Value Ref Range   WBC 7.5 4.0 - 10.5 K/uL   RBC 4.56 3.87 - 5.11 MIL/uL   Hemoglobin 14.5 12.0 - 15.0 g/dL   HCT 42.6 36.0 - 46.0 %   MCV 93.4 78.0 - 100.0 fL   MCH 31.8 26.0 - 34.0 pg   MCHC 34.0 30.0 - 36.0 g/dL   RDW 13.7 11.5 - 15.5 %   Platelets 189 150 - 400 K/uL  Comprehensive metabolic panel     Status: Abnormal   Collection Time: 11/24/14  2:34 PM  Result Value Ref Range   Sodium 135 135 - 145 mmol/L   Potassium 3.4 (L) 3.5 - 5.1 mmol/L   Chloride 100 96 - 112 mmol/L   CO2 23 19 - 32 mmol/L   Glucose, Bld 111 (H) 70 - 99 mg/dL   BUN 11 6 - 23 mg/dL   Creatinine, Ser 0.61 0.50 - 1.10 mg/dL   Calcium 8.7 8.4 - 10.5 mg/dL   Total Protein 7.6 6.0 - 8.3 g/dL   Albumin 4.1 3.5 - 5.2 g/dL   AST 37 0 - 37 U/L   ALT 15 0 - 35 U/L   Alkaline Phosphatase 88 39 - 117 U/L   Total Bilirubin 1.5 (H) 0.3 - 1.2 mg/dL   GFR calc non Af Amer >90 >90 mL/min   GFR calc Af Amer >90 >90 mL/min    Comment: (NOTE) The eGFR has been calculated using the CKD EPI equation. This calculation has not been validated in all clinical situations. eGFR's persistently <90 mL/min signify possible Chronic Kidney Disease.    Anion gap 12 5 - 15  Ethanol     Status: Abnormal   Collection Time: 11/24/14  2:34 PM  Result Value Ref Range   Alcohol, Ethyl (B) 22 (H) 0 - 9 mg/dL    Comment:        LOWEST DETECTABLE LIMIT FOR SERUM ALCOHOL IS 11 mg/dL FOR MEDICAL PURPOSES ONLY   Acetaminophen level     Status: Abnormal   Collection Time: 11/24/14  2:34 PM  Result Value Ref Range   Acetaminophen (Tylenol), Serum <10.0 (L) 10 - 30 ug/mL     Comment:        THERAPEUTIC CONCENTRATIONS VARY SIGNIFICANTLY. A RANGE OF 10-30 ug/mL MAY BE AN EFFECTIVE CONCENTRATION FOR MANY PATIENTS. HOWEVER, SOME ARE BEST TREATED AT CONCENTRATIONS OUTSIDE THIS RANGE. ACETAMINOPHEN CONCENTRATIONS >150 ug/mL AT 4 HOURS AFTER INGESTION AND >50 ug/mL AT 12 HOURS AFTER INGESTION ARE OFTEN ASSOCIATED WITH TOXIC REACTIONS.   Salicylate level     Status: None   Collection Time: 11/24/14  2:34 PM  Result Value Ref Range   Salicylate Lvl <1.5 2.8 - 20.0 mg/dL  POC Urine Pregnancy, ED (do NOT order at [facility])     Status: None   Collection Time: 11/24/14  4:03 PM  Result Value Ref Range   Preg Test, Ur NEGATIVE NEGATIVE    Comment:        THE SENSITIVITY OF THIS METHODOLOGY IS >24 mIU/mL     Vitals: Blood pressure 107/87, pulse 128, temperature 98.3 F (36.8 C), temperature source Oral, resp. rate 17, last menstrual period 11/24/2014, SpO2 97 %.  Risk to Self: Is patient at risk for suicide?: No Risk to Others:   Prior Inpatient Therapy:   Prior Outpatient Therapy:    Current Facility-Administered Medications  Medication Dose Route Frequency Provider Last Rate Last Dose  . alum & mag hydroxide-simeth (MAALOX/MYLANTA) 200-200-20 MG/5ML suspension 30 mL  30 mL Oral PRN Serita Grit, MD      . ibuprofen (ADVIL,MOTRIN) tablet 600 mg  600 mg Oral Q8H PRN Serita Grit, MD      . LORazepam (ATIVAN) tablet 0-4 mg  0-4 mg Oral 4 times per day Serita Grit, MD   1 mg at 11/25/14 1235   Followed by  . [START ON 11/26/2014] LORazepam (ATIVAN) tablet 0-4 mg  0-4 mg Oral Q12H Serita Grit, MD      . thiamine (VITAMIN B-1) tablet 100 mg  100 mg Oral Daily Serita Grit, MD   100 mg at 11/25/14 1761   Or  . thiamine (B-1) injection 100 mg  100 mg Intravenous Daily Serita Grit, MD       Current Outpatient Prescriptions  Medication Sig Dispense Refill  . HYDROcodone-acetaminophen (NORCO) 5-325 MG per tablet Take 1-2 tablets by mouth every 4 (four) hours as  needed for pain. (Patient not taking: Reported on 11/24/2014) 20 tablet 0    Musculoskeletal: Strength & Muscle Tone: within normal limits Gait & Station: normal Patient leans: N/A  Psychiatric Specialty Exam:     Blood pressure 107/87, pulse 128, temperature 98.3 F (36.8 C), temperature source Oral, resp. rate 17, last menstrual period 11/24/2014, SpO2 97 %.There is no weight on file to calculate BMI.  General Appearance: Casual  Eye Contact::  Good  Speech:  Clear and Coherent and Normal Rate  Volume:  Normal  Mood:  Anxious and Depressed  Affect:  Congruent and Depressed  Thought Process:  Coherent, Goal Directed and Intact  Orientation:  Full (Time, Place, and Person)  Thought Content:  WDL  Suicidal Thoughts:  No  Homicidal Thoughts:  No  Memory:  Immediate;   Good Recent;   Good Remote;   Good  Judgement:  Fair  Insight:  Fair  Psychomotor Activity:  Normal  Concentration:  Good  Recall:  NA  Fund of Knowledge:Good  Language: NA  Akathisia:  NA  Handed:  Right  AIMS (if indicated):     Assets:  Desire for Improvement  ADL's:  Intact  Cognition: WNL  Sleep:      Medical Decision Making: Established Problem, Worsening (2)  Treatment Plan Summary: Daily contact with patient to assess and evaluate symptoms and progress in treatment, Medication management and Plan Re-evaluate in am   Plan:  reevaluate in am Disposition: see above  Delfin Gant   PMHNP-BC 11/25/2014 1:35 PM

## 2014-11-25 NOTE — ED Notes (Signed)
Acuity level remains moderate.  Alcohol detox with withdrawal symptoms requiring medication on a regular basis.

## 2014-11-25 NOTE — ED Notes (Signed)
Acuity level is moderate.  She continues to detox from alcohol and is having active withdrawal symptoms.

## 2014-11-25 NOTE — ED Notes (Signed)
Acuity level is moderate.  Patient is detoxing from alcohol and is very anxious.

## 2014-11-26 DIAGNOSIS — F411 Generalized anxiety disorder: Secondary | ICD-10-CM

## 2014-11-26 DIAGNOSIS — F1010 Alcohol abuse, uncomplicated: Secondary | ICD-10-CM

## 2014-11-26 DIAGNOSIS — F1020 Alcohol dependence, uncomplicated: Secondary | ICD-10-CM | POA: Diagnosis not present

## 2014-11-26 HISTORY — DX: Alcohol abuse, uncomplicated: F10.10

## 2014-11-26 MED ORDER — GABAPENTIN 300 MG PO CAPS: 300.0000 mg | ORAL_CAPSULE | Freq: Three times a day (TID) | ORAL | Status: DC

## 2014-11-26 NOTE — ED Notes (Signed)
Patient resting quietly in bed watching TV. Respirations equal and unlabored, skin warm and dry, NAD. No change in assessment or acuity. Q 15 minute safety checks remain in place.

## 2014-11-26 NOTE — Consult Note (Signed)
[facility] Consult   Reason for Consult: Alcohol use disorder, mood disorder, and generalized anxiety disorder Referring Physician:  edp Patient Identification: Kristine Macias MRN:  [id number] Principal Diagnosis: Alcohol use disorder, severe, dependence Diagnosis:   Patient Active Problem List   Diagnosis Date Noted  . Alcohol use disorder, severe, dependence [F10.20] 11/25/2014  . Pelvic pain in pregnancy [O26.899, R10.2] 04/10/2012  . Vaginitis [N76.0] 04/10/2012  . Adnexal mass [N94.9] 04/10/2012    Total Time spent with patient: 30 minutes  Subjective:   Kristine Macias is a 29 y.o. female patient admitted with Alcohol use disorder, severe, Mood disorder  HPI:  Caucasian female, 29 years old was evaluated for Alcohol use disorder.  Patient walked to [facility] yesterday seeking Alcohol detox treatment.  She reported drinking a case of beer daily and also reported suffering from depression and anxiety.  Patient reports that she usually drink more Alcohol than take her medications.  She admitted to two previous detox treatment and stated that her longest period of sobriety has been a period of 6 months.   She also reports a hx of Alcohol withdrawal  Seizures.    Patient reports that yesterday was  the anniversary of the murder of her 73 year old brother who  was murdered in 71. Patient reports that her father died in October 23, 2012. She  reports that she has been drinking to deal with the anxiety of these  Deaths for two years.  She was diagnosed with depression and anxiety at age 39 and 52 respectively and was on Xanax for 4 years.  Patient denies SI/HI/AVH but reports extreme anxiety related to the deaths in her family.  We will keep patient overnight and reevaluate in am.  We will continue to treat her anxiety and continue detox treatment with our Ativan protocol.  11/26/2014:  Kristine Macias is not experiencing significant withdrawal symptoms.  She has a history of anxiety and  agoraphobia.  Anxiety has been up and down during this stay.  SSRI's did not help or had side effects she said.  Alprazolam prn did help and she did not misuse it she said.  She has not been able to see her former doctor and has used alcohol to control her anxiety with all the problems that causes which she recognizes.  She has been in therapy for anxiety and does use her breathing and distracting exercises sometimes.  HPI Elements:   Location:  Alcohol use disorder, anxiety disorder. Quality:  severe with c/o extreme anxiety and drinking. Severity:  severe. Timing:  Acute. Duration:  two years . Context:  seeking detox treatment and anxiety treatment..  Past Medical History:  Past Medical History  Diagnosis Date  . Anxiety   . Chlamydia 10-23-2005  . Irregular heart beat 10/23/2008    wt/diet related after evaluation  . Renal disorder   . Pneumothorax, spontaneous, tension     Past Surgical History  Procedure Laterality Date  . Cesarean section    . Chest tube insertion     Family History:  Family History  Problem Relation Age of Onset  . Anesthesia problems Neg Hx   . Thyroid disease Mother   . Cancer Father   . Stroke Other    Social History:  History  Alcohol Use No     History  Drug Use No    History   Social History  . Marital Status: Single    Spouse Name: N/A  . Number of Children: N/A  .  Years of Education: N/A   Social History Main Topics  . Smoking status: Current Every Day Smoker -- 0.25 packs/day    Types: Cigarettes  . Smokeless tobacco: Never Used  . Alcohol Use: No  . Drug Use: No  . Sexual Activity: Yes    Birth Control/ Protection: None   Other Topics Concern  . None   Social History Narrative   Additional Social History:                          Allergies:   Allergies  Allergen Reactions  . Sulfa Antibiotics Anaphylaxis  . Tetracyclines & Related Anaphylaxis  . Latex Hives and Itching    Labs:  Results for orders placed or  performed during the hospital encounter of 11/24/14 (from the past 48 hour(s))  Drug screen panel, emergency     Status: Abnormal   Collection Time: 11/24/14  2:09 PM  Result Value Ref Range   Opiates NONE DETECTED NONE DETECTED   Cocaine NONE DETECTED NONE DETECTED   Benzodiazepines POSITIVE (A) NONE DETECTED   Amphetamines NONE DETECTED NONE DETECTED   Tetrahydrocannabinol NONE DETECTED NONE DETECTED   Barbiturates NONE DETECTED NONE DETECTED    Comment:        DRUG SCREEN FOR MEDICAL PURPOSES ONLY.  IF CONFIRMATION IS NEEDED FOR ANY PURPOSE, NOTIFY LAB WITHIN 5 DAYS.        LOWEST DETECTABLE LIMITS FOR URINE DRUG SCREEN Drug Class       Cutoff (ng/mL) Amphetamine      1000 Barbiturate      200 Benzodiazepine   440 Tricyclics       347 Opiates          300 Cocaine          300 THC              50   CBC     Status: None   Collection Time: 11/24/14  2:34 PM  Result Value Ref Range   WBC 7.5 4.0 - 10.5 K/uL   RBC 4.56 3.87 - 5.11 MIL/uL   Hemoglobin 14.5 12.0 - 15.0 g/dL   HCT 42.6 36.0 - 46.0 %   MCV 93.4 78.0 - 100.0 fL   MCH 31.8 26.0 - 34.0 pg   MCHC 34.0 30.0 - 36.0 g/dL   RDW 13.7 11.5 - 15.5 %   Platelets 189 150 - 400 K/uL  Comprehensive metabolic panel     Status: Abnormal   Collection Time: 11/24/14  2:34 PM  Result Value Ref Range   Sodium 135 135 - 145 mmol/L   Potassium 3.4 (L) 3.5 - 5.1 mmol/L   Chloride 100 96 - 112 mmol/L   CO2 23 19 - 32 mmol/L   Glucose, Bld 111 (H) 70 - 99 mg/dL   BUN 11 6 - 23 mg/dL   Creatinine, Ser 0.61 0.50 - 1.10 mg/dL   Calcium 8.7 8.4 - 10.5 mg/dL   Total Protein 7.6 6.0 - 8.3 g/dL   Albumin 4.1 3.5 - 5.2 g/dL   AST 37 0 - 37 U/L   ALT 15 0 - 35 U/L   Alkaline Phosphatase 88 39 - 117 U/L   Total Bilirubin 1.5 (H) 0.3 - 1.2 mg/dL   GFR calc non Af Amer >90 >90 mL/min   GFR calc Af Amer >90 >90 mL/min    Comment: (NOTE) The eGFR has been calculated using the CKD EPI equation. This calculation  has not been validated in  all clinical situations. eGFR's persistently <90 mL/min signify possible Chronic Kidney Disease.    Anion gap 12 5 - 15  Ethanol     Status: Abnormal   Collection Time: 11/24/14  2:34 PM  Result Value Ref Range   Alcohol, Ethyl (B) 22 (H) 0 - 9 mg/dL    Comment:        LOWEST DETECTABLE LIMIT FOR SERUM ALCOHOL IS 11 mg/dL FOR MEDICAL PURPOSES ONLY   Acetaminophen level     Status: Abnormal   Collection Time: 11/24/14  2:34 PM  Result Value Ref Range   Acetaminophen (Tylenol), Serum <10.0 (L) 10 - 30 ug/mL    Comment:        THERAPEUTIC CONCENTRATIONS VARY SIGNIFICANTLY. A RANGE OF 10-30 ug/mL MAY BE AN EFFECTIVE CONCENTRATION FOR MANY PATIENTS. HOWEVER, SOME ARE BEST TREATED AT CONCENTRATIONS OUTSIDE THIS RANGE. ACETAMINOPHEN CONCENTRATIONS >150 ug/mL AT 4 HOURS AFTER INGESTION AND >50 ug/mL AT 12 HOURS AFTER INGESTION ARE OFTEN ASSOCIATED WITH TOXIC REACTIONS.   Salicylate level     Status: None   Collection Time: 11/24/14  2:34 PM  Result Value Ref Range   Salicylate Lvl <8.8 2.8 - 20.0 mg/dL  POC Urine Pregnancy, ED (do NOT order at [facility])     Status: None   Collection Time: 11/24/14  4:03 PM  Result Value Ref Range   Preg Test, Ur NEGATIVE NEGATIVE    Comment:        THE SENSITIVITY OF THIS METHODOLOGY IS >24 mIU/mL     Vitals: Blood pressure 111/70, pulse 106, temperature 97.9 F (36.6 C), temperature source Oral, resp. rate 20, last menstrual period 11/24/2014, SpO2 99 %.  Risk to Self: Is patient at risk for suicide?: No Risk to Others:   Prior Inpatient Therapy:   Prior Outpatient Therapy:    Current Facility-Administered Medications  Medication Dose Route Frequency Provider Last Rate Last Dose  . alum & mag hydroxide-simeth (MAALOX/MYLANTA) 200-200-20 MG/5ML suspension 30 mL  30 mL Oral PRN Serita Grit, MD      . ibuprofen (ADVIL,MOTRIN) tablet 600 mg  600 mg Oral Q8H PRN Serita Grit, MD      . LORazepam (ATIVAN) tablet 0-4 mg  0-4 mg Oral 4 times  per day Serita Grit, MD   1 mg at 11/26/14 4166   Followed by  . LORazepam (ATIVAN) tablet 0-4 mg  0-4 mg Oral Q12H Serita Grit, MD      . thiamine (VITAMIN B-1) tablet 100 mg  100 mg Oral Daily Serita Grit, MD   100 mg at 11/26/14 0630   Or  . thiamine (B-1) injection 100 mg  100 mg Intravenous Daily Serita Grit, MD       Current Outpatient Prescriptions  Medication Sig Dispense Refill  . HYDROcodone-acetaminophen (NORCO) 5-325 MG per tablet Take 1-2 tablets by mouth every 4 (four) hours as needed for pain. (Patient not taking: Reported on 11/24/2014) 20 tablet 0    Musculoskeletal: Strength & Muscle Tone: within normal limits Gait & Station: normal Patient leans: N/A  Psychiatric Specialty Exam:     Blood pressure 111/70, pulse 106, temperature 97.9 F (36.6 C), temperature source Oral, resp. rate 20, last menstrual period 11/24/2014, SpO2 99 %.There is no weight on file to calculate BMI.  General Appearance: Casual  Eye Contact::  Good  Speech:  Clear and Coherent and Normal Rate  Volume:  Normal  Mood:  Anxious and Depressed  Affect:  Congruent  and Depressed  Thought Process:  Coherent, Goal Directed and Intact  Orientation:  Full (Time, Place, and Person)  Thought Content:  WDL  Suicidal Thoughts:  No  Homicidal Thoughts:  No  Memory:  Immediate;   Good Recent;   Good Remote;   Good  Judgement:  Fair  Insight:  Fair  Psychomotor Activity:  Normal  Concentration:  Good  Recall:  NA  Fund of Knowledge:Good  Language: NA  Akathisia:  NA  Handed:  Right  AIMS (if indicated):     Assets:  Desire for Improvement  ADL's:  Intact  Cognition: WNL  Sleep:      Medical Decision Making: no withdrawal symptoms, anxiety still elevated reviewed medication possibilities with her and their side effects and selected gabapentin with the possibility of propranolol in the future  Treatment Plan Summary: Re evaluated today and will discharge with a prescription for gabapentin  and resources for outpatient follow up  Plan:  As above Disposition: discharge home today  Clarene Reamer   PMHNP-BC 11/26/2014 9:40 AM

## 2014-11-26 NOTE — [facility] Suicide Risk Assessment (Signed)
[facility] Discharge Suicide Risk Assessment   Demographic Factors:  29 year old female  Total Time spent with patient: 30 minutes  Musculoskeletal: Strength & Muscle Tone: within normal limits Gait & Station: normal Patient leans: N/A  Psychiatric Specialty Exam: Physical Exam  ROS  Blood pressure 111/70, pulse 106, temperature 97.9 F (36.6 C), temperature source Oral, resp. rate 20, last menstrual period 11/24/2014, SpO2 99 %.There is no weight on file to calculate BMI.  General Appearance: Casual  Eye Contact::  Good  Speech:  Clear and Coherent409  Volume:  Normal  Mood:  Anxious  Affect:  Appropriate  Thought Process:  Coherent  Orientation:  Full (Time, Place, and Person)  Thought Content:  Negative  Suicidal Thoughts:  No  Homicidal Thoughts:  No  Memory:  Immediate;   Good Recent;   Good Remote;   Good  Judgement:  Good  Insight:  Good  Psychomotor Activity:  Normal  Concentration:  Good  Recall:  Good  Fund of Knowledge:Good  Language: Good  Akathisia:  Negative  Handed:  Right  AIMS (if indicated):     Assets:  Communication Skills Desire for Improvement Housing Resilience  Sleep:     Cognition: WNL  ADL's:  Intact      Has this patient used any form of tobacco in the last 30 days? (Cigarettes, Smokeless Tobacco, Cigars, and/or Pipes) No  Mental Status Per Nursing Assessment::   On Admission:     Current Mental Status by Physician: NA  Loss Factors: NA  Historical Factors: NA  Risk Reduction Factors:   Positive coping skills or problem solving skills  Continued Clinical Symptoms:  Severe Anxiety and/or Agitation  Cognitive Features That Contribute To Risk:  None    Suicide Risk:  Minimal: No identifiable suicidal ideation.  Patients presenting with no risk factors but with morbid ruminations; may be classified as minimal risk based on the severity of the depressive symptoms  Principal Problem: Alcohol use disorder, severe,  dependence Discharge Diagnoses:  Patient Active Problem List   Diagnosis Date Noted  . Generalized anxiety disorder [F41.1] 11/26/2014    Priority: Medium    Class: Chronic  . Alcohol use disorder, severe, dependence [F10.20] 11/25/2014  . Pelvic pain in pregnancy [O26.899, R10.2] 04/10/2012  . Vaginitis [N76.0] 04/10/2012  . Adnexal mass [N94.9] 04/10/2012      Plan Of Care/Follow-up recommendations:  Activity:  resume usual activity Diet:  resume usual diet  Is patient on multiple antipsychotic therapies at discharge:  No   Has Patient had three or more failed trials of antipsychotic monotherapy by history:  No  Recommended Plan for Multiple Antipsychotic Therapies: NA    TAYLOR,GERALD D 11/26/2014, 9:56 AM

## 2014-11-26 NOTE — ED Notes (Addendum)
Pt resting quietly in bed with eyes closed. Respirations are even and unlabored. No acute distress noted. Safety has been maintained with q15 minute observation. Will continue current POC. Acuity low

## 2014-11-26 NOTE — ED Notes (Signed)
Patient resting quietly in bed with eyes closed, Respirations equal and unlabored, skin warm and dry, NAD. No change in assessment or acuity. Q 15 minute safety checks remain in place.

## 2014-11-26 NOTE — ED Notes (Signed)
Patient watching TV in day room. Respirations equal and unlabored, skin warm and dry, NAD. No change in assessment or acuity. Q 15 minute safety checks remain in place.

## 2014-12-17 NOTE — Op Note (Signed)
PATIENT NAME:  Kristine Macias, Kristine Macias MR#:  [id number] DATE OF BIRTH:  09/30/1985  DATE OF PROCEDURE:  09/20/2012  PREOPERATIVE DIAGNOSIS: Spontaneous right pneumothorax.   POSTOPERATIVE DIAGNOSIS: Spontaneous right pneumothorax.  PROCEDURE PERFORMED: Insertion of 28 French straight chest tube.   SURGEON: Kelsey Edman A. Marina Gravel M.D.   ASSISTANTS: None.   ANESTHESIA: 10 mL of 1% plain lidocaine, 25 mg of intravenous Phenergan, 4 mg of intravenous Versed, 8 mg of intravenous morphine with continuous pulse oximetry.   FINDINGS: A rush of air.   DRAINS: A 20-French straight chest.   DESCRIPTION OF PROCEDURE: With the patient in the supine position and after informed consent, the right chest was sterilely prepped and draped with Betadine solution. A time-out was observed.   A field block was created along the 6th-7th interspace at mid axillary line with 1% lidocaine anesthesia. A 1.5 cm incision was fashioned with a #11 blade. Blunt dissection was then taken down overlying the rib. The rib was then anesthetized both medially and laterally. the pleural space was entered with the needle. The pleura was anesthetized. Utilizing the trocar introducer with a 20-French chest tube, the chest tubes placed. A rush of air was encountered. The chest tube was inserted to 14 cm. It was then secured at the skin site with two 0 silk sutures. A sterile occlusive compressive dressing was placed consisting of Vaseline impregnated gauze and 4 x 4's. The chest tube was placed to an atrium canister. An air leak was encountered. It was placed through -20 cm of suction. The patient tolerated procedure well without immediate complication.   ____________________________ Jeannette How Marina Gravel, MD mab:jm D: 09/20/2012 01:27:18 ET T: 09/20/2012 11:39:51 ET JOB#: [id number]  cc: Elta Guadeloupe A. Marina Gravel, MD, <Dictator> Hortencia Conradi MD ELECTRONICALLY SIGNED 09/20/2012 17:53

## 2014-12-17 NOTE — H&P (Signed)
    Subjective/Chief Complaint right sided chest pain.    History of Present Illness 29 y/o 1 ppd smoker presents with acute onset of right sided chest pain made worse with deep breaths.  No previous history of such.  No past medical history.  Chest xray in ER shows large pneumothorax. on right side.    Past History none   Past Med/Surgical Hx:  denies:   denies:   ALLERGIES:  Sulfa drugs: Wheezing  Latex: Rash    Other Allergies none   Family and Social History:   Family History Non-Contributory    Social History positive  tobacco, negative ETOH, negative Illicit drugs    + Tobacco Current (within 1 year)    Place of Living Home   Review of Systems:   Subjective/Chief Complaint see above    Tolerating Diet Yes    Medications/Allergies Reviewed Medications/Allergies reviewed   Physical Exam:   GEN no acute distress, thin    HEENT pale conjunctivae, PERRL    NECK supple    RESP normal resp effort  no use of accessory muscles  diminished BS on right.    CARD regular rate    ABD denies tenderness  soft    LYMPH negative neck    EXTR negative cyanosis/clubbing    SKIN normal to palpation, No rashes, No ulcers    NEURO cranial nerves intact    PSYCH alert, A+O to time, place, person     Assessment/Admission Diagnosis 29 year old female with sponatenous right sided pneumothorax with history of tobacco abuse and dependemce.    Plan Right 20 Fr chest tube. admit pain control. Am chest xray   Electronic Signatures: Sherri Rad (MD)  (Signed 25-Jan-14 00:39)  Authored: CHIEF COMPLAINT and HISTORY, PAST MEDICAL/SURGIAL HISTORY, ALLERGIES, Other Allergies, FAMILY AND SOCIAL HISTORY, REVIEW OF SYSTEMS, PHYSICAL EXAM, ASSESSMENT AND PLAN   Last Updated: 25-Jan-14 00:39 by Sherri Rad (MD)

## 2014-12-17 NOTE — Discharge Summary (Signed)
PATIENT NAME:  Kristine Macias, PEPITONE MR#:  [id number] DATE OF BIRTH:  05-Jan-1986  DATE OF ADMISSION:  09/20/2012 DATE OF DISCHARGE:  09/22/2012  DISCHARGE DIAGNOSES:  1. Right spontaneous pneumothorax.  2. Tobacco use dependence.   DISCHARGE MEDICATIONS: Percocet 1 to 2 tabs p.o. q. 4 p.r.n. pain.   ALLERGIES: SULFA DRUGS AND LATEX.   HISTORY OF PRESENT ILLNESS:  [name] is a pleasant 29 year old female with 10-year smoking history who presents with back pain. She had an x-ray which showed a large pneumothorax. She thus was admitted for tube thoracostomy.   HOSPITAL COURSE: [name] was admitted and a chest tube placed. Post chest tube placement, chest tube was put on suction. She was admitted for pain control. Throughout her hospital course her chest tube was placed to water seal and later discontinued. Post-discontinue chest x-ray showed no residual pneumothorax. At time of discharge, she was taking p.o. and had good p.o. pain control. She was breathing without difficulty and pain was controlled with p.o. pain meds.   POSTOP INSTRUCTIONS: [name] is to follow up in approximately 1 week. She is to call or return to the ED if she develops pain, shortness of breath or drainage or redness from the incision. She is to remove her dressing and 48 to 72 hours. I have explained this to her. ____________________________ Glena Norfolk. Aliviyah Malanga, MD cal:sb D: 09/22/2012 16:22:21 ET T: 09/23/2012 08:02:20 ET JOB#: [id number]  cc: Harrell Gave A. Fynlee Rowlands, MD, <Dictator> Floyde Parkins MD ELECTRONICALLY SIGNED 09/27/2012 13:18

## 2014-12-17 NOTE — H&P (Signed)
PATIENT NAME:  Kristine Macias, MCNEFF MR#:  [id number] DATE OF BIRTH:  07/13/1986  DATE OF ADMISSION:  09/20/2012  ADMITTING DIAGNOSES:   1.  Spontaneous right pneumothorax.  2.  Tobacco abuse and dependence.   HISTORY OF PRESENT ILLNESS:  This is a 29 year old white female with a 1 pack per day cigarette smoking history for the last 10 years presents to the Emergency Room with the acute onset of right-sided chest pain and mild shortness of breath with pleuritic component starting at 4:00 on Friday morning.  The pain progressed.  The patient came to the Emergency Room.  Chest x-ray was obtained demonstrating a large right-sided pneumothorax without evidence of tension clinically or radiographically.  Surgical services were asked to consult.  The patient denies any previous pain of this nature.   ALLERGIES:  SULFA AND LATEX.   MEDICATIONS:  None.   PAST MEDICAL HISTORY:  Tobacco abuse and dependence.   PAST SURGICAL HISTORY:  None.   SOCIAL HISTORY:  The patient has a child.  Smokes as described above.  Drinks occasionally.  REVIEW OF SYSTEMS:  Is as described above.   PHYSICAL EXAMINATION: GENERAL:  The patient is in no obvious distress.  VITAL SIGNS:  Pulse is 80, blood pressure was 100/60.   HEENT:  Facies are symmetrical.  The patient is tall and thin with a long face.  LUNGS:  Demonstrate diminished breath sounds on the right.  Clear on the left.  HEART:  Regular rate and rhythm.  ABDOMEN:  Scaphoid, soft and nontender.  No masses.  No hernias.  EXTREMITIES:  Are warm and well-perfused.  SKIN:  Normal.  No scleral icterus is present.  NEUROLOGIC:  Grossly normal.  Alert and oriented x 4.  MUSCULOSKELETAL:  Grossly normal.  RECTAL AND GENITOURINARY:  Was deferred.   LABORATORY DATA:  None.  Review of chest x-ray, approximately 30% right-sided pneumothorax.  No effusion.   IMPRESSION:  The patient is a 29 year old white female with spontaneous right pneumothorax in the setting of  tobacco abuse and dependence.  PLAN:  A small chest tube will be placed.  She will be admitted for pain control, observation.  Depending on how her air leak proceeds on this Pleur-evac, she may or may not require a thoracoscopic intervention.  I discussed this with her and her significant other and all of her questions were answered.   TOTAL TIME SPENT:  Sixty minutes.    ____________________________ Jeannette How Marina Gravel, MD Ricke Hey D: 09/20/2012 01:30:24 ET T: 09/20/2012 06:21:11 ET JOB#: [id number]  cc: Elta Guadeloupe A. Marina Gravel, MD, <Dictator> Hortencia Conradi MD ELECTRONICALLY SIGNED 09/20/2012 6:53

## 2014-12-17 NOTE — Discharge Summary (Signed)
PATIENT NAME:  Kristine Macias, Kristine Macias MR#:  [id number] DATE OF BIRTH:  1986-04-28  DATE OF ADMISSION:  09/20/2012 DATE OF DISCHARGE:  09/22/2012  DISCHARGE DIAGNOSES:  1. Right spontaneous pneumothorax.  2. Tobacco use and dependence.   DISCHARGE MEDICATIONS: Percocet 1 to 2 tabs p.o. q. 4 hours p.r.n. pain.   ALLERGIES: SULFA DRUGS AND LATEX.   HISTORY OF PRESENT ILLNESS: Ms. Lalla is a pleasant 29 year old female with 10-year smoking history who presents with back pain. She had an x-ray which showed a large hemothorax. She thus was admitted for tube thoracostomy.   HOSPITAL COURSE: Ms. Szeliga was admitted and a chest tube placed. Post chest tube placement, chest tube was put on suction. She was admitted for pain control. Throughout her hospital course, her chest tube was placed to water seal and later discontinued. Post discontinuing, chest x-ray showed no residual pneumothorax. At time of discharge was taking good p.o., had good p.o. pain control, was breathing without difficulty and pain was controlled with p.o. pain medications.   POSTOP INSTRUCTIONS: Ms. Spilman is to follow up in approximately 1 week. She is to call or return to the ED if she develops pain, shortness of breath or drainage or redness from the incision. She is to remove the dressing in 48 to 72 hours.  ____________________________ Glena Norfolk George Haggart, MD cal:sb D: 09/22/2012 16:22:21 ET T: 09/23/2012 07:55:42 ET JOB#: [id number]  cc: Harrell Gave A. Lovie Zarling, MD, <Dictator> Floyde Parkins MD ELECTRONICALLY SIGNED 09/27/2012 13:18

## 2014-12-18 NOTE — Discharge Summary (Signed)
PATIENT NAME:  Kristine Macias, Kristine Macias MR#:  [id number] DATE OF BIRTH:  [birth year]  DATE OF ADMISSION:  10/29/2013 DATE OF DISCHARGE:  11/03/2013  DISCHARGE DIAGNOSES: 1.  Acute appendicitis, improving with antibiotic. Conservative management recommended per surgery. 2.  Metabolic acidosis with anion gap lactic acidosis due to severe sepsis and alcohol abuse, now resolved.  3.  Right-sided pleural effusion leading to increasing shortness of breath, status post ultrasound-guided thoracentesis with removal of 600 mL fluid, transudative in nature.  4.  Alcohol withdrawal, now resolved. 5.  Nausea and vomiting, now resolved.  6.  Hypoglycemia, now resolved, likely due to poor p.o. intake.  7.  Elevated liver function tests secondary to alcoholic hepatitis and hepatic steatosis.  8.  Hypophosphatemia, repleted and resolved.  9.  Some shaking and tremors, likely secondary to poor p.o. intake, hypoglycemia and/or alcohol withdrawal, now improving.   SECONDARY DIAGNOSES:  History of spontaneous pneumothorax.   CONSULTATIONS:  Surgery, Dr. Pat Patrick.   PROCEDURES AND RADIOLOGY:  1.  CT scan of the abdomen and pelvis with contrast on 4th of March showed possible early acute appendicitis. Hepatic steatosis.  2.  Chest x-ray on 7th of March showed large right-sided pleural effusion with compressive atelectasis.  3.  Ultrasound-guided thoracentesis on 9th of March with successful removal of 600 mL of clear, yellow pleural fluid.  4.  Chest x-ray on 9th of March after thoracentesis showed no evidence of pneumothorax. Near total interval resolution of right-sided pleural effusion with trace of residual fluid and basilar atelectasis.  5.  CT scan of the abdomen and pelvis with contrast on 9th of March showed persistent significant hepatic steatosis, no evidence of acute appendicitis. New small bilateral pleural effusion with bibasilar atelectasis and elevation of right-sided hemidiaphragm, improving liver enlargement  suggesting recovery of acute liver injury.   LABORATORY PANEL:  1.  UA on admission was showing no bacteria, 12 WBCs and trace leuk esterase.  2.  Serum Tylenol level less than 2, salicylate level less than 1.7.  3.  Serum acetone level was elevated with a value of 0.017.   4.  Stool for C. difficile was negative.  5.  Pleural fluid did not have any growth on 9th of March.  6.  Cytology of the fluid was negative for any malignant cells.   HISTORY AND SHORT HOSPITAL COURSE: The patient is a 29 year old female with the above-mentioned medical problems who was admitted for possible mild acute appendicitis along with large right-sided pleural effusion with symptomatic nausea, vomiting, abdominal pain and severe metabolic acidosis with concern for alcohol withdrawal. Please see Dr. Ward Givens dictated history and physical for further details. Surgical consultation was obtained with Dr. Pat Patrick, who did not find her appendix to be a major concern and recommended conservative plans with ongoing antibiotic. The patient was improving in terms of her symptoms.  Her right-sided large pleural effusion was drained under ultrasound guidance on 9th of March with significant relief in her shortness of breath. Her abdominal pain was resolved and she was tolerating diet today and is being discharged on 10th of March in stable condition. Repeat CT scan did not show any appendicitis although she did have hepatic steatosis.   PERTINENT PHYSICAL EXAMINATION ON THE DATE OF DISCHARGE:  CARDIOVASCULAR: S1, S2 normal. No murmurs, rubs or gallop.  LUNGS: Clear to auscultation bilaterally. No wheezing, rales, rhonchi or crepitation.  ABDOMEN: Soft, benign.  NEUROLOGIC: Nonfocal examination.  All other physical examination remained at baseline.  VITAL SIGNS:  Temperature is  97.3, heart rate 101 per minute, respirations 18 per minute, blood pressure 116/80 mmHg, she was saturating 94% on room air.  DISCHARGE MEDICATIONS:   Augmentin 875/125 mg 1 tablet p.o. b.i.d. for 4 more days.   DISCHARGE DIET: Low sodium.   DISCHARGE ACTIVITY: As tolerated.   DISCHARGE INSTRUCTIONS AND FOLLOWUP: The patient was instructed to follow up with Dr. Pat Patrick from surgical service in 1 to 2 weeks as an outpatient. She will need followup with her primary care physician in 4 to 6 weeks and [facility] hepatology in 2 to 4 weeks for evaluation of her hepatic steatosis. She was instructed not to drink alcohol as this was thought to be a major contributor for her hospitalization and her overall comorbidities. She remains at very high risk for readmission including alcohol withdrawal and hypoglycemia due to poor p.o. intake although she was staying anywhere from 90s to 100 while here in the hospital.   [place] THIS PATIENT: 55 minutes.    ____________________________ Lucina Mellow. Manuella Ghazi, MD vss:cs D: 11/03/2013 17:45:00 ET T: 11/03/2013 18:20:44 ET JOB#: [id number]  cc: Micheline Maze, MD Samuel Mcpeek S. Manuella Ghazi, MD, <Dictator> Algis Liming. Jimmye Norman, MD    [place] MD ELECTRONICALLY SIGNED 11/05/2013 19:54

## 2014-12-18 NOTE — Consult Note (Signed)
PATIENT NAME:  Kristine Macias, RAHL MR#:  [id number] DATE OF BIRTH:  1986/06/10  DATE OF CONSULTATION:  07/19/2014  CONSULTING PHYSICIAN:  Gonzella Lex, MD  IDENTIFYING INFORMATION AND REASON FOR CONSULTATION: This is a 29 year old woman currently in the hospital for nausea and vomiting.   CHIEF COMPLAINT: "I'm killing myself drinking."   HISTORY OF PRESENT ILLNESS: Information obtained from the patient and the chart. The patient says that she started having a heavy drinking problem this past January. Her father had just passed away and she became emotionally overwhelmed and could not deal with her anxiety anymore. Started drinking vodka heavily. She has had a previous detoxification back in the spring of this year. Has managed to stay sober for about 2 months, but now has been back to drinking. Currently drinking beer and says she drinks a couple of 24 ounces cans of beer a day, but it is enough to make her physically sick. She says she has not been eating or drinking for the last couple weeks. Feels sick to her stomach, throws up a lot, has had some blood in her vomit. She is not currently getting any outpatient mental health treatment. Additionally, she reports a diagnosis of generalized anxiety disorder for which she is not currently getting any treatment, but from which she suffers a lot of anxiety and feeling overwhelmed.   PAST PSYCHIATRIC HISTORY: The patient was diagnosed with generalized anxiety disorder by her primary care doctor and was treated with Xanax when she was much younger. Got tolerant to that and discontinued it years ago, but has not been on any other psychiatric medicines since then. Has never been in a psychiatric hospital, never been referred to an actual mental health provider.   The patient denies any history of suicide attempts.   SUBSTANCE ABUSE HISTORY: She says she did not have a drinking problem until this past January when she started drinking to deal with her anxiety  after her father died. She was very close to her father and says that she just could not handle it after he passed away. She reports that she has had one seizure that she knows of from alcohol withdrawal. Does not have a known history of delirium tremens.   MEDICAL HISTORY: The patient has lost weight and is sick to her stomach and throwing up from her drinking. No other known underlying medical problems.   SOCIAL HISTORY: The patient is married. Has 2 young children. She does not work outside the home. She reports that her relationship with her husband is good, but at the same time says that her anxiety and drinking make him angry which has not been helpful for her.   FAMILY HISTORY:  Had a grandmother with anxiety and agoraphobia and several people in her family with alcohol abuse.   CURRENT MEDICATIONS:  Does not appear to have been taking anything really on admission. She is getting Protonix IV and detoxification protocol now.   ALLERGIES: SULFA DRUGS AND LATEX.   REVIEW OF SYSTEMS: Feels very nervous, sick to her stomach. Has not thrown up this evening, though. Tired, embarrassed, negative about herself, but denies any suicidal ideation. Denies any hallucinations.   MENTAL STATUS EXAMINATION: Thin, chronically ill-looking woman, looks her stated age. Passively cooperative with the interview. Eye contact is decreased. Psychomotor activity slow with a tremor. Speech very quiet, and almost whispered. Decreased in amount. Affect flat and nervous. Mood stated as being anxious. Thoughts are lucid. No obvious delusions or loosening of associations.  Denies auditory or visual hallucinations. Denies suicidal or homicidal ideation. She recalled 3/3 objects immediately, but could not remember any of them at 3 minutes. She was alert and oriented x 4. Judgment and insight appear to be appropriate. Normal intelligence.   LABORATORY RESULTS: On admission yesterday, magnesium low at 1.7, lipase normal. AST and  ALT both elevated. Albumin low at 3, potassium low at 2.9, glucose elevated at 151. I do not see that there was a drug screen or an alcohol level drawn. I do note that her hemoglobin is a little bit low, but not trending down.   VITAL SIGNS: Currently, blood pressure 129/86, respirations 18, pulse 103, temperature 97.5.   ASSESSMENT: This is a 29 year old woman with alcohol abuse and generalized anxiety disorder, neither of which She is getting outpatient treatment for. She needs alcohol detoxification protocol now. I have gone ahead and ordered the full protocol order set. Needs monitoring to make sure she does not have a seizure or develop delirium tremens. Given her acute sickness, I am not going to start any medicine for the generalized anxiety disorder, but some kind of serotonin reuptake inhibitor would probably be good to start before discharge. I have requested social work to give her a referral to local mental health treatment so that she can start getting real treatment for what she has. We will follow up as needed.   DIAGNOSIS, PRINCIPAL AND PRIMARY:  AXIS I: Alcohol abuse, severe.   SECONDARY DIAGNOSES: AXIS I: Generalized anxiety disorder.  AXIS II: Deferred.  AXIS III: Alcohol withdrawal.   ____________________________ Gonzella Lex, MD jtc:LT D: 07/19/2014 18:21:42 ET T: 07/19/2014 18:39:24 ET JOB#: [id number]  cc: Gonzella Lex, MD, <Dictator> Gonzella Lex MD ELECTRONICALLY SIGNED 08/06/2014 19:08

## 2014-12-18 NOTE — H&P (Signed)
PATIENT NAME:  Kristine Macias, PERL MR#:  [id number] DATE OF BIRTH:  11/18/1985  DATE OF ADMISSION:  07/18/2014  PRIMARY CARE PHYSICIAN: Dr. Jimmye Norman. REFERRING PHYSICIAN:Dr. Schaevitz.  CHIEF COMPLAINT: Vomiting for 5 days, worsening today.   HISTORY OF PRESENT ILLNESS: The patient is a 29 year old Caucasian female with a history of alcohol abuse, pneumonthorax and appendicitis, presented in the ED with the above chief complaint. The patient is alert, awake, oriented, in no acute distress. The patient has had nausea and vomiting for the past 5 days, which is worsening today. In addition, the patient noticed some bloody streaks with vomitus. In addition, the patient complaints of watery diarrhea with some dark stool. The patient has worsening tremor. The patient also complains of cough, sputum and flu-like symptoms for the past few days, but she denies any fever or chills. She had tachycardia at 130 in ED.  PAST MEDICAL HISTORY: Spontaneous pneumothorax, appendicitis, essential tremor, alcohol abuse.   PAST SURGICAL HISTORY: Chest tube placement, cesarean section.   SOCIAL HISTORY: Smokes 1 pack a day for many years. Drinks alcohol on a daily basis, about two or three 24 ounces of beer. She denies illicit drugs.   FAMILY HISTORY: Mother had hypertension and atrial fibrillation.  ALLERGIES: SULFA DRUGS, LATEX.  HOME MEDICATIONS: None.  REVIEW OF SYSTEMS: CONSTITUTIONAL: The patient denies any fever or chills. No headache or dizziness, but has generalized weakness.  EYES: No double vision or blurred vision.  EARS, NOSE AND THROAT: No postnasal drip, slurred speech or dysphagia.  CARDIOVASCULAR: No chest pain, palpitation, orthopnea or nocturnal dyspnea. No leg edema.  PULMONARY: Positive for cough, sputum. No shortness of breath or hemoptysis.  GASTROINTESTINAL: Positive for abdominal pain, nausea, vomiting, diarrhea, melena, but no bloody stool.  GENITOURINARY: No dysuria, hematuria or  incontinence.  SKIN: No rash or jaundice.  NEUROLOGY: No syncope, loss of consciousness or seizure, but has essential tremor and worsening tremor.  HEMATOLOGY: No easy bruising or bleeding.  ENDOCRINOLOGY: No polyuria, polydipsia, heat or cold intolerance.  PSYCHIATRY: Anxiety but no depression.  PHYSICAL EXAMINATION:  VITAL SIGNS: Temperature 98.3, blood pressure 127/88, pulse 130, oxygen saturation 97% on room air, respirations 18.  GENERAL: The patient is alert, awake, oriented, in no acute distress.  HEENT: Pupils round, equal and reactive to light and accommodation. Moist oral mucosa. Clear oropharynx.  NECK: Supple. No JVD or carotid bruit. No lymphadenopathy. No thyromegaly,  CARDIOVASCULAR: S1 and S2, regular rate with tachycardia. No murmurs, gallops.  PULMONARY: Bilateral air entry. No wheezing or rales. No use of accessory muscle to breathe.  ABDOMEN: Soft. Tenderness in epigastric area. No rigidity. No rebound. Bowel sounds present. No organomegaly.  EXTREMITIES: No edema, clubbing or cyanosis. No calf tenderness. Bilateral pedal pulses present.  NEUROLOGIC: A and O x 3. No focal deficit, but has an essential tremor. Power 5/5. Sensation intact.   LABORATORY DATA: Chest x-ray: Mild bronchitic changes without acute infiltrate.   CBC in normal range. Glucose 155, BUN 8, creatinine 0.75, potassium 2.9, chloride 95, bicarb 27, SGPT 66, alkaline phosphatase 151, SGOT 199, albumin 3.0. Lipase 104, acetaminophen less than 2, salicylate less than 1.7. Phosphate 2.8. Magnesium 1.7.   IMPRESSIONS:  1.  Alcohol abuse with withdrawal.  2.  Hypokalemia.  3.  Hypomagnesemia.  4.  Possible acute gastroenteritis, need rule out GI bleeding.  5.  Tobacco abuse.  6.  Anxiety. 7.  Abnomal liver function test, possible due to alcohol abuse.  PLAN OF TREATMENT:  1.  The  patient will be admitted to the medical floor. We will start CIWA protocol with a banana bag, IV PB and Ativan p.r.n.  2.   For hypokalemia and Hypomagnesemia,  we will give potassium supplement and magnesium supplement.  3.  For acute gastroenteritis, we will give Zofran p.r.n., IV fluid support and pain control. Since the patient complains of dark stool and specks of blood in vomitus, will start protonix iv bid and follow up hemoglobin, stool occult and culture.  4.  For tobacco abuse, smoking cessation was counseled. We will give nicotine patch.  5.  For anxiety and alcohol abuse, I will request behavioral medicine consult.   I discussed the patient's condition and plan of treatment with the patient and the patient's mother.   TIME SPENT: About 58 minutes.    ____________________________ Demetrios Loll, MD qc:TT D: 07/18/2014 15:43:19 ET T: 07/18/2014 16:41:45 ET JOB#: [id number]  cc: Demetrios Loll, MD, <Dictator> Demetrios Loll MD ELECTRONICALLY SIGNED 07/18/2014 17:43

## 2014-12-18 NOTE — H&P (Signed)
PATIENT NAME:  Kristine Macias, Kristine Macias MR#:  [id number] DATE OF BIRTH:  23-Apr-1986  DATE OF ADMISSION:  10/29/2013  PRIMARY CARE PHYSICIAN:  Dr. Lenise Arena.   REFERRING PHYSICIAN:  Dr. Marjean Donna.   CHIEF COMPLAINT:  Abdominal pain.   HISTORY OF PRESENT ILLNESS:  Kristine Macias is a 29 year old white female with history of heavy alcohol use, drinks about a half a gallon of vodka on a daily basis, has been having nausea, vomiting for the last two days.  The patient was unable to get out of the bed for the last 2 to 3 days.  For the last 24 to 48 hours has been vomiting brown to blackish material.  Has been extremely lethargic.  The patient has not drank any alcohol in the last 24 hours.  About 2 hours back the patient started to complain of pain on the right side of the abdomen.  Concerning this, the patient is brought to the Emergency Department, could not tell if patient was having any fever.  Work-up in the Emergency Department, the patient was found to be in severe metabolic acidosis.  Initial ABG:  PH of 7.18, pCO2 of 23 and bicarb of 8.6.  Initial lactic acid was 8.3.  The patient is also found to have other lab abnormalities like mild elevation of the LFTs 5 to 6 times above the normal.  The patient received 5 mg of IV Ativan.  The patient's initial blood pressure was 90/40; however, the patient received 4 liters of IV fluids with improvement of the blood pressure 140/90.  The patient initially was tachycardic with a heart rate of 150s, currently improved to 120s.  CT abdomen and pelvis is consistent with appendicitis.  The patient is seen by general surgery who felt the patient is currently unstable for the surgery considering the patient's multiple medical problems.  CIWA protocol has been initiated in the Emergency Department.   PAST MEDICAL HISTORY: 1.  Spontaneous pneumothorax.  2.  Chest tube placement.  3.  C-section.   ALLERGIES:  1.  SULFA.  2.  LATEX.   HOME MEDICATIONS:  None.    SOCIAL HISTORY:  Continues to smoke of unknown quantity.  Drinks one fifth of vodka on a daily basis.  Married, lives with her husband, has two children.   FAMILY HISTORY:  Could not be obtained from the patient.   REVIEW OF SYSTEMS:  Could not be obtained from the patient as the patient is currently obtunded.  PHYSICAL EXAMINATION: GENERAL:  This is a cachectic-looking female lying down in the bed, confused, is able to tell her name, recognize her mother. VITAL SIGNS:  Temperature 97.4, pulse 120, blood pressure 146/105, respiratory rate of 18, oxygen saturation 100% on room air.  HEENT:  Head normocephalic, atraumatic.  Eyes, no scleral icterus.  Conjunctivae normal.  Pupils mid dilated, reactive to light.  Mucous membranes could not examine.  Could not examine the oropharynx.   NECK:  Supple.  No lymphadenopathy.  No JVD.  No carotid bruit.  No thyromegaly.  CHEST:  Has no focal tenderness.  LUNGS:  Mild coarse breath sounds.  HEART:  S1, S2, regular, tachycardia.  ABDOMEN:  Bowel sounds plus.  Soft.  Has tenderness in the epigastric area.  Also some tenderness in the right upper quadrant.  Mild guarding.  No rebound tenderness.  However, the patient is quite in altered mental status.  EXTREMITIES:  No pedal edema, pulses 2+.  NEUROLOGIC:  The patient is only oriented to  self, but not to time, place.  Moving all four extremities.  Could not examine the motor and sensory exam.   LABORATORY DATA:  CBC, WBC of 16.6, hemoglobin of 16, platelet count of 241, MCV 107.  CMP, BUN 20, creatinine of 1.24, bicarb of 9, alk phos of 170, ALT 140, AST 305.  Beta HCG is negative.  Lactic acid 8.3.  UA, 1+ leukocyte esterase, WBC of 12.  ABG, pH of 7.18, pCO2 of 23, PaO2 of 102.   ASSESSMENT AND PLAN:  Kristine Macias is a 29 year old female with heavy alcohol use who comes to the Emergency Department with nausea and vomiting for two days duration and is also found to have appendicitis and severe metabolic  acidosis.  1.  Severe metabolic acidosis.  This could be a combination of factors.  Lactic acidosis, starvation ketoacidosis, alcohol ketoacidosis.  However, cannot exclude ethylene glycol poisoning.  We will order the levels  alcohol.  Continue with D5 half-normal saline after giving the thiamin.  Follow up with basic metabolic panel q. 6 hours.  Admit the patient to the Intensive Care Unit.  2.  Alcohol withdrawal.  The patient is requiring high doses of Ativan.  Watch the patient closely in the Intensive Care Unit.  3.  Nausea, vomiting.  This could be from alcohol-induced gastritis and appendicitis.  Keep the patient on Protonix 40 mg twice daily, keep the patient nothing by mouth.  Considering the patient's appendicis, the patient is also kept on antibiotics.  4.  Alcoholic abuse.  Discussed with the patient's mother who states that the patient has been drinking since her father's death to cope from the grief.  We will need further counseling with the patient.  5.  Severe dehydration from unable to keep down any food for the last two days with nausea and vomiting.  6.  Appendicitis.  The patient will be followed by general surgery.  Appreciate if they would follow up.   7.  Elevated liver function tests, secondary to alcoholic hepatitis and is a possibility of shock liver.  Continue with IV fluids and follow up.  8.  Electrolyte imbalances.  Considering the patient unable to keep down any food and severe alcohol use and starvation, we will closely follow up with potassium, magnesium and phosphorus.  Considering the patient's heavy alcohol use the patient is at high risk of developing life threatening arrhythmias.  We will have a close followup.   9.  Keep the patient on deep vein thrombosis prophylaxis with Lovenox.   TIME SPENT:  70 minutes of critical care time.    ____________________________ Monica Becton, MD pv:ea D: 10/29/2013 03:49:11 ET T: 10/29/2013 04:38:23  ET JOB#: [id number]  cc: Monica Becton, MD, <Dictator> Monica Becton MD ELECTRONICALLY SIGNED 11/12/2013 4:28

## 2014-12-18 NOTE — Discharge Summary (Signed)
PATIENT NAME:  Kristine, Macias MR#:  [id number] DATE OF BIRTH:  05-Aug-1986  DATE OF ADMISSION:  07/18/2014 DATE OF DISCHARGE:  07/20/2014  CHIEF COMPLAINT AT THE TIME OF ADMISSION: Vomiting for 5 days, worsening on the day of admission.   ADMITTING DIAGNOSES: Alcohol abuse with withdrawal, hypokalemia, hypomagnesemia, possible acute gastroenteritis, rule out gastrointestinal bleeding, tobacco abuse, anxiety, abnormal liver function tests from alcohol abuse.   DISCHARGE DIAGNOSES:  1.  Acute gastroenteritis, probably viral, resolved, rule out gastrointestinal bleeding.  2.  Acute alcohol abuse. CIWA score was low. 3.  Hypokalemia, repleted. 4.  Hypomagnesemia, repleted.  5.  Tobacco abuse. Counseled the patient to quit smoking. 6   Generalized anxiety disorder. The patient is started on medications.   PROCEDURES: None.   CONSULTATIONS: Psychiatry, Gonzella Lex, MD.   BRIEF HISTORY AND PHYSICAL AND HOSPITAL COURSE: The patient is a 29 year old Caucasian female with a history of alcohol abuse, came into the ED with a chief complaint of vomiting. Please review history and physical for details. The patient was initially tachycardic, complaining of watery diarrhea with some dark stool. She was given IV fluids. CIWA protocol was implemented. Electrolytes were supplemented. Antiemetics and pain management was provided. The patient was started on IV Protonix and stool for occult blood was ordered.   HOSPITAL COURSE:  1.  Acute gastroenteritis. By the next day, the patient's nausea, vomiting and diarrhea were completely resolved. She is complaining of abdominal discomfort but no pain. The patient was hungry and wanted to eat. The patient was started on clear liquids and diet was advanced slowly.  2.  The patient was complaining of dizziness. IV fluids were continued. A PPI was provided. Pain management was given as needed basis. Antiemetics were given as needed basis. Dizziness was significantly  improved with IV fluids and the patient started ambulating without any difficulty, as reported by the nurses.  3.  Questionable GI bleed. Stool for Hemoccult was positive but no blood was noticed in the stool. Stool was not dark, tarry, no melena was noticed. Hemoglobin was stable. Initially, the patient was hemoconcentrated from dehydration but with IV fluids she was hemodiluted. Otherwise, no acute bleeding was noticed.  4.  Alcohol abuse. The patient was placed on CIWA protocol and scores were monitored closely. The patient's CIWA score was 2 and 1 on November 24th. She was evaluated by Dr. Weber Cooks. He has recommended the patient to follow up with local mental health as an outpatient. Inpatient rehabilitation was offered, but the patient has refused. The patient was followed up by Dr. Weber Cooks during the hospital course. The patient was started on Paxil for generalized anxiety disorder and trazodone 50 mg every night at bedtime regarding sleep. The patient was recommended to follow up with outpatient mental health.  5.  Nicotine dependence. The patient was counseled to quit smoking and recommended to continue using nicotine patch.   Overall, her condition significantly improved. Hypokalemia and hypomagnesemia from alcohol abuse was replaced. Condition being stable, decision is made to discharge the patient home on November 24th.   On November 24th, temperature 97.7, pulse 88, respirations 18, blood pressure 117/76. Orthostatics: In supine posture blood pressure was 117/76; in standing posture, 125/90. Pulse oximetry 96%. LFTs: Total protein 5.1 bilirubin total 0.6, direct bilirubin is normal, AST 82, ALT 37. Stool for Hemoccult was positive only 1 time. Urine drug screen was negative. Hemoglobin at the time of admission 16.4, hematocrit 47.4; on November 23rd hemoglobin 11.7, hematocrit 34.8, but repeat  was 12.5 and hematocrit was 37.6. Having said that, the hemoglobin results and hematocrit results on  November 23rd at 4:00 a.m. was probably a technical error. Stool culture has revealed no shigella or salmonella. Stool for occult blood was positive x 1. Serum acetaminophen level and salicylate level was less than 2 and less than 1.7 respectively. Chest x-ray: Mild bronchitic change without acute infiltrate.   MEDICATIONS AT THE TIME OF DISCHARGE: Paxil 10 mg p.o. once daily for 5 days and then increase the dose to 20 mg once daily from 07/25/2014. Trazodone 50 mg 1 tablet p.o. at bedtime as needed for sleep. Nicotine 14 mg transdermal patch to continue and change once daily, thiamine 100 mg once daily, multivitamin 1 tablet p.o. once daily, calcium with vitamin D 1 tablet p.o. once daily for 30 days.   DIET: Regular.   ACTIVITY: As tolerated.   DISCHARGE INSTRUCTIONS: Return to work in a week.   FOLLOWUP: With primary care physician in a week. Follow up with outpatient mental health in 1-2 weeks. Follow up with psychiatry in 1-2 weeks.   Plan of care was discussed in detail with the patient. She verbalized understanding of the plan.   TOTAL TIME SPENT ON THE DISCHARGE: Forty-five minutes.     ____________________________ Nicholes Mango, MD ag:TT D: 07/22/2014 17:40:34 ET T: 07/22/2014 22:21:03 ET JOB#: [id number]  cc: Nicholes Mango, MD, <Dictator> Nicholes Mango MD ELECTRONICALLY SIGNED 07/23/2014 18:15

## 2014-12-18 NOTE — Consult Note (Signed)
Psychiatry: Patient seen and chart reviewed. Patient being discharged today. Continues to feel anxious and worried. Nervous about the possibility of having a panic attack and also about drinking. Not having suicidal ideation. hallucinations. Still has trouble sleeping. No suicidal ideation. Nausea better. flat. Speech very quiet and slow. Mood nervous. Alert and oriented and appears cognitively intact. Denies any suicidal thinking. voluntary admission to psychiatry but she prefers not to do that. Will start paxil for panic and GAD. Dose 19m daily for 5 days then increase to 222ma day. Also will write for trazodone 5065mt night as needed for sleep. Would avoid benzos outpt due to high dependence and addiction risk. She is to follow up with outpt MH treatment and attend AA. Patient agrees to the plan.  Electronic Signatures: ClaGonzella LexD)  (Signed on 24-Nov-15 10:14)  Authored  Last Updated: 24-Nov-15 10:14 by ClaGonzella LexD)

## 2014-12-18 NOTE — H&P (Signed)
   Subjective/Chief Complaint RLQ pain x 1 day, nausea/vomiting, lactic acidosis   History of Present Illness Kristine Macias is a pleasant 29 yo F who presents with 1 day of RLQ pain, nausea and vomiting.  She says that her pain began acutely approx 9 pm yesterday.  Since then it has gotten more intense and is focused over RLQ (more diffuse initially).  Has not been able to keep anything down.  Usually drinks 1 fifth of liquor per day and has not had any today.  Has not gone this long without drinking in past.  ER note from July 2014 notes that she has been abusing alcohol since her father died.   Past History H/o spontaneous pneumothorax H/o cesarean section EtOH abuse Tobacco use   Past Medical Health Smoking, Alcohol Consumption/Dependency   Past Med/Surgical Hx:  Pneumothorax:   chest tube placement:   Cesarean Section:   ALLERGIES:  Sulfa drugs: Wheezing  Latex: Rash  Family and Social History:  Family History Coronary Artery Disease  Hypertension  Diabetes Mellitus  Cancer  H/o lung cancer   Social History positive  tobacco, 2 ppd Tobacco, 1 fifth of liquor per day   + Tobacco Current (within 1 year)   Place of Living Here with husband   Review of Systems:  Subjective/Chief Complaint RLQ pain, nausea/vomiting   Fever/Chills No   Cough No   Sputum No   Abdominal Pain Yes   Diarrhea No   Constipation No   Nausea/Vomiting Yes   SOB/DOE No   Chest Pain No   Dysuria No   Tolerating Diet No  Nauseated  Vomiting   Physical Exam:  GEN cachectic, thin, disheveled, T   HEENT pink conjunctivae, PERRL, poor dentition   RESP normal resp effort  clear BS   CARD Tachycardic, regular   ABD positive tenderness  no hernia  soft  normal BS   SKIN normal to palpation, No rashes, No ulcers   NEURO positive tremor   PSYCH A+O to time, place, person, anxious, agitated    Assessment/Admission Diagnosis Kristine Macias is a pleasant 29 yo F who presents with RLQ pain,  leukocytosis and CT findings concerning for early appendicitis.  Clinically tender to palpation RLQ.  + lactic acidosis, elevated LFT.  I feel that she likely has appendicitis and acidosis is in part due to chronic dehydration and EtOH.   Plan Will plan resuscitation and when electrolytes improved will perform appendectomy.  Patient already anxious and with tremors, on CIWA.  Would prefer appendectomy prior to patient going into DT.  Will continue to reassess.   Electronic Signatures: Floyde Parkins (MD)  (Signed [phone] 00:07)  Authored: CHIEF COMPLAINT and HISTORY, PAST MEDICAL/SURGIAL HISTORY, ALLERGIES, FAMILY AND SOCIAL HISTORY, REVIEW OF SYSTEMS, PHYSICAL EXAM, ASSESSMENT AND PLAN   Last Updated: 05-Mar-15 00:07 by Floyde Parkins (MD)

## 2014-12-20 ENCOUNTER — Emergency Department
Admit: 2014-12-20 | Disposition: A | Discharge status: Home / Self Care | Payer: Self-pay | Admitting: Emergency Medicine

## 2014-12-20 LAB — COMPREHENSIVE METABOLIC PANEL
Albumin: 3.4 g/dL — ABNORMAL LOW
Alkaline Phosphatase: 155 U/L — ABNORMAL HIGH
Anion Gap: 13 (ref 7–16)
BILIRUBIN TOTAL: 1.2 mg/dL
BUN: 10 mg/dL
CREATININE: 0.77 mg/dL
Calcium, Total: 8.5 mg/dL — ABNORMAL LOW
Chloride: 91 mmol/L — ABNORMAL LOW
Co2: 27 mmol/L
GLUCOSE: 157 mg/dL — AB
Potassium: 3 mmol/L — ABNORMAL LOW
SGOT(AST): 234 U/L — ABNORMAL HIGH
SGPT (ALT): 61 U/L — ABNORMAL HIGH
SODIUM: 131 mmol/L — AB
Total Protein: 6.7 g/dL

## 2014-12-20 LAB — CBC
HCT: 45 % (ref 35.0–47.0)
HGB: 15.2 g/dL (ref 12.0–16.0)
MCH: 32.9 pg (ref 26.0–34.0)
MCHC: 33.8 g/dL (ref 32.0–36.0)
MCV: 97 fL (ref 80–100)
Platelet: 106 10*3/uL — ABNORMAL LOW (ref 150–440)
RBC: 4.62 10*6/uL (ref 3.80–5.20)
RDW: 16 % — ABNORMAL HIGH (ref 11.5–14.5)
WBC: 6.6 10*3/uL (ref 3.6–11.0)

## 2014-12-20 LAB — URINALYSIS, COMPLETE
BACTERIA: NONE SEEN
Bilirubin,UR: NEGATIVE
Glucose,UR: NEGATIVE mg/dL (ref 0–75)
Ketone: NEGATIVE
Leukocyte Esterase: NEGATIVE
NITRITE: NEGATIVE
Ph: 6 (ref 4.5–8.0)
Protein: NEGATIVE
Specific Gravity: 1.006 (ref 1.003–1.030)

## 2014-12-20 LAB — LIPASE, BLOOD: Lipase: 27 U/L

## 2014-12-26 DIAGNOSIS — R627 Adult failure to thrive: Secondary | ICD-10-CM

## 2014-12-26 DIAGNOSIS — K529 Noninfective gastroenteritis and colitis, unspecified: Secondary | ICD-10-CM

## 2014-12-26 HISTORY — DX: Adult failure to thrive: R62.7

## 2014-12-26 HISTORY — DX: Noninfective gastroenteritis and colitis, unspecified: K52.9

## 2015-01-04 ENCOUNTER — Inpatient Hospital Stay
Admission: EM | Admit: 2015-01-04 | Discharge: 2015-01-13 | DRG: 385 | Disposition: A | Discharge status: Home Health | Payer: Medicaid Other | Attending: Internal Medicine | Admitting: Internal Medicine

## 2015-01-04 ENCOUNTER — Encounter: Payer: Self-pay | Admitting: Medical Oncology

## 2015-01-04 ENCOUNTER — Emergency Department: Payer: Medicaid Other

## 2015-01-04 DIAGNOSIS — K51018 Ulcerative (chronic) pancolitis with other complication: Principal | ICD-10-CM | POA: Diagnosis present

## 2015-01-04 DIAGNOSIS — Z87448 Personal history of other diseases of urinary system: Secondary | ICD-10-CM | POA: Diagnosis not present

## 2015-01-04 DIAGNOSIS — F411 Generalized anxiety disorder: Secondary | ICD-10-CM | POA: Diagnosis present

## 2015-01-04 DIAGNOSIS — R55 Syncope and collapse: Secondary | ICD-10-CM | POA: Diagnosis not present

## 2015-01-04 DIAGNOSIS — F1010 Alcohol abuse, uncomplicated: Secondary | ICD-10-CM | POA: Diagnosis not present

## 2015-01-04 DIAGNOSIS — R112 Nausea with vomiting, unspecified: Secondary | ICD-10-CM

## 2015-01-04 DIAGNOSIS — Z91040 Latex allergy status: Secondary | ICD-10-CM | POA: Diagnosis not present

## 2015-01-04 DIAGNOSIS — K7010 Alcoholic hepatitis without ascites: Secondary | ICD-10-CM | POA: Diagnosis present

## 2015-01-04 DIAGNOSIS — F419 Anxiety disorder, unspecified: Secondary | ICD-10-CM | POA: Diagnosis not present

## 2015-01-04 DIAGNOSIS — R740 Nonspecific elevation of levels of transaminase and lactic acid dehydrogenase [LDH]: Secondary | ICD-10-CM | POA: Diagnosis present

## 2015-01-04 DIAGNOSIS — F17210 Nicotine dependence, cigarettes, uncomplicated: Secondary | ICD-10-CM | POA: Diagnosis present

## 2015-01-04 DIAGNOSIS — Z8619 Personal history of other infectious and parasitic diseases: Secondary | ICD-10-CM | POA: Diagnosis not present

## 2015-01-04 DIAGNOSIS — M6281 Muscle weakness (generalized): Secondary | ICD-10-CM | POA: Diagnosis not present

## 2015-01-04 DIAGNOSIS — Z8709 Personal history of other diseases of the respiratory system: Secondary | ICD-10-CM | POA: Diagnosis not present

## 2015-01-04 DIAGNOSIS — Z681 Body mass index (BMI) 19 or less, adult: Secondary | ICD-10-CM

## 2015-01-04 DIAGNOSIS — E46 Unspecified protein-calorie malnutrition: Secondary | ICD-10-CM | POA: Diagnosis not present

## 2015-01-04 DIAGNOSIS — Z599 Problem related to housing and economic circumstances, unspecified: Secondary | ICD-10-CM

## 2015-01-04 DIAGNOSIS — E43 Unspecified severe protein-calorie malnutrition: Secondary | ICD-10-CM | POA: Diagnosis present

## 2015-01-04 DIAGNOSIS — K5100 Ulcerative (chronic) pancolitis without complications: Secondary | ICD-10-CM

## 2015-01-04 DIAGNOSIS — F10230 Alcohol dependence with withdrawal, uncomplicated: Secondary | ICD-10-CM | POA: Diagnosis present

## 2015-01-04 DIAGNOSIS — Z3202 Encounter for pregnancy test, result negative: Secondary | ICD-10-CM | POA: Diagnosis not present

## 2015-01-04 DIAGNOSIS — R634 Abnormal weight loss: Secondary | ICD-10-CM

## 2015-01-04 DIAGNOSIS — K529 Noninfective gastroenteritis and colitis, unspecified: Secondary | ICD-10-CM | POA: Diagnosis present

## 2015-01-04 DIAGNOSIS — Z8679 Personal history of other diseases of the circulatory system: Secondary | ICD-10-CM | POA: Diagnosis not present

## 2015-01-04 DIAGNOSIS — Z720 Tobacco use: Secondary | ICD-10-CM | POA: Diagnosis not present

## 2015-01-04 DIAGNOSIS — R569 Unspecified convulsions: Secondary | ICD-10-CM | POA: Diagnosis present

## 2015-01-04 DIAGNOSIS — Z79899 Other long term (current) drug therapy: Secondary | ICD-10-CM | POA: Diagnosis not present

## 2015-01-04 DIAGNOSIS — F1310 Sedative, hypnotic or anxiolytic abuse, uncomplicated: Secondary | ICD-10-CM | POA: Diagnosis not present

## 2015-01-04 DIAGNOSIS — R7401 Elevation of levels of liver transaminase levels: Secondary | ICD-10-CM

## 2015-01-04 HISTORY — DX: Alcohol abuse, uncomplicated: F10.10

## 2015-01-04 LAB — CBC WITH DIFFERENTIAL/PLATELET
BASOS PCT: 1 %
Basophils Absolute: 0 10*3/uL (ref 0–0.1)
EOS ABS: 0 10*3/uL (ref 0–0.7)
Eosinophils Relative: 0 %
HCT: 40.4 % (ref 35.0–47.0)
Hemoglobin: 14 g/dL (ref 12.0–16.0)
Lymphocytes Relative: 20 %
Lymphs Abs: 1.1 10*3/uL (ref 1.0–3.6)
MCH: 34.7 pg — AB (ref 26.0–34.0)
MCHC: 34.5 g/dL (ref 32.0–36.0)
MCV: 100.3 fL — ABNORMAL HIGH (ref 80.0–100.0)
Monocytes Absolute: 0.2 10*3/uL (ref 0.2–0.9)
Monocytes Relative: 4 %
NEUTROS PCT: 75 %
Neutro Abs: 4.1 10*3/uL (ref 1.4–6.5)
Platelets: 191 10*3/uL (ref 150–440)
RBC: 4.03 MIL/uL (ref 3.80–5.20)
RDW: 20.9 % — ABNORMAL HIGH (ref 11.5–14.5)
WBC: 5.5 10*3/uL (ref 3.6–11.0)

## 2015-01-04 LAB — COMPREHENSIVE METABOLIC PANEL
ALBUMIN: 3 g/dL — AB (ref 3.5–5.0)
ALT: 145 U/L — ABNORMAL HIGH (ref 14–54)
AST: 431 U/L — AB (ref 15–41)
Alkaline Phosphatase: 223 U/L — ABNORMAL HIGH (ref 38–126)
Anion gap: 13 (ref 5–15)
BILIRUBIN TOTAL: 1 mg/dL (ref 0.3–1.2)
BUN: 10 mg/dL (ref 6–20)
CHLORIDE: 95 mmol/L — AB (ref 101–111)
CO2: 26 mmol/L (ref 22–32)
CREATININE: 0.52 mg/dL (ref 0.44–1.00)
Calcium: 8.1 mg/dL — ABNORMAL LOW (ref 8.9–10.3)
GFR calc Af Amer: >60 mL/min (ref >60)
GFR calc non Af Amer: >60 mL/min (ref >60)
Glucose, Bld: 117 mg/dL — ABNORMAL HIGH (ref 65–99)
Potassium: 3.2 mmol/L — ABNORMAL LOW (ref 3.5–5.1)
Sodium: 134 mmol/L — ABNORMAL LOW (ref 135–145)
TOTAL PROTEIN: 6.1 g/dL — AB (ref 6.5–8.1)

## 2015-01-04 LAB — URINALYSIS COMPLETE WITH MICROSCOPIC (ARMC ONLY)
BILIRUBIN URINE: NEGATIVE
GLUCOSE, UA: 50 mg/dL — AB
Leukocytes, UA: NEGATIVE
Nitrite: NEGATIVE
Protein, ur: 30 mg/dL — AB
SPECIFIC GRAVITY, URINE: 1.024 (ref 1.005–1.030)
pH: 5 (ref 5.0–8.0)

## 2015-01-04 LAB — TSH: TSH: 2.725 u[IU]/mL (ref 0.350–4.500)

## 2015-01-04 LAB — PHOSPHORUS: PHOSPHORUS: 4.3 mg/dL (ref 2.5–4.6)

## 2015-01-04 LAB — MAGNESIUM: MAGNESIUM: 1.7 mg/dL (ref 1.7–2.4)

## 2015-01-04 LAB — HCG, QUANTITATIVE, PREGNANCY: hCG, Beta Chain, Quant, S: <1 m[IU]/mL (ref <5)

## 2015-01-04 LAB — POCT PREGNANCY, URINE: PREG TEST UR: NEGATIVE

## 2015-01-04 MED ORDER — ACETAMINOPHEN 650 MG RE SUPP
650.0000 mg | Freq: Four times a day (QID) | RECTAL | Status: DC | PRN
  Filled 2015-01-04: qty 1

## 2015-01-04 MED ORDER — ONDANSETRON HCL 4 MG PO TABS
4.0000 mg | ORAL_TABLET | Freq: Four times a day (QID) | ORAL | Status: DC | PRN
  Administered 2015-01-11: 23:00:00 4 mg via ORAL
  Filled 2015-01-04 (×3): qty 1

## 2015-01-04 MED ORDER — LORAZEPAM 2 MG/ML IJ SOLN
INTRAMUSCULAR | Status: AC
  Administered 2015-01-04: 0.5 mg via INTRAVENOUS
  Filled 2015-01-04: qty 1

## 2015-01-04 MED ORDER — FOLIC ACID 1 MG PO TABS
1.0000 mg | ORAL_TABLET | Freq: Every day | ORAL | Status: DC
  Administered 2015-01-04 – 2015-01-13 (×9): 1 mg via ORAL
  Filled 2015-01-04 (×11): qty 1

## 2015-01-04 MED ORDER — HEPARIN SODIUM (PORCINE) 5000 UNIT/ML IJ SOLN
5000.0000 [IU] | Freq: Three times a day (TID) | INTRAMUSCULAR | Status: DC
  Administered 2015-01-04 – 2015-01-13 (×5): 5000 [IU] via SUBCUTANEOUS
  Filled 2015-01-04 (×17): qty 1

## 2015-01-04 MED ORDER — FOLIC ACID 1 MG PO TABS
ORAL_TABLET | ORAL | Status: AC
  Filled 2015-01-04: qty 1

## 2015-01-04 MED ORDER — ADULT MULTIVITAMIN W/MINERALS CH
1.0000 | ORAL_TABLET | Freq: Every day | ORAL | Status: DC
  Administered 2015-01-04 – 2015-01-13 (×9): 1 via ORAL
  Filled 2015-01-04 (×11): qty 1

## 2015-01-04 MED ORDER — POTASSIUM CHLORIDE IN NACL 20-0.9 MEQ/L-% IV SOLN
INTRAVENOUS | Status: DC
  Administered 2015-01-04 – 2015-01-06 (×4): via INTRAVENOUS
  Filled 2015-01-04 (×7): qty 1000

## 2015-01-04 MED ORDER — ONDANSETRON HCL 4 MG/2ML IJ SOLN
INTRAMUSCULAR | Status: AC
  Administered 2015-01-04: 4 mg via INTRAVENOUS
  Filled 2015-01-04: qty 2

## 2015-01-04 MED ORDER — PNEUMOCOCCAL VAC POLYVALENT 25 MCG/0.5ML IJ INJ: 0.5000 mL | INJECTION | INTRAMUSCULAR | Status: DC

## 2015-01-04 MED ORDER — FOLIC ACID 1 MG PO TABS
ORAL_TABLET | ORAL | Status: AC
  Administered 2015-01-04: 1 mg via ORAL
  Filled 2015-01-04: qty 1

## 2015-01-04 MED ORDER — LORAZEPAM 2 MG/ML IJ SOLN
INTRAMUSCULAR | Status: AC
  Administered 2015-01-04: 1 mg via INTRAVENOUS
  Filled 2015-01-04: qty 1

## 2015-01-04 MED ORDER — LORAZEPAM 2 MG/ML IJ SOLN
1.0000 mg | Freq: Four times a day (QID) | INTRAMUSCULAR | Status: AC | PRN
  Administered 2015-01-04 – 2015-01-05 (×2): 1 mg via INTRAVENOUS
  Filled 2015-01-04: qty 1

## 2015-01-04 MED ORDER — ONDANSETRON HCL 4 MG/2ML IJ SOLN
4.0000 mg | Freq: Four times a day (QID) | INTRAMUSCULAR | Status: DC | PRN
  Administered 2015-01-04 – 2015-01-11 (×3): 4 mg via INTRAVENOUS
  Filled 2015-01-04 (×2): qty 2

## 2015-01-04 MED ORDER — IOHEXOL 300 MG/ML  SOLN
100.0000 mL | Freq: Once | INTRAMUSCULAR | Status: AC | PRN
  Administered 2015-01-04: 100 mL via INTRAVENOUS
  Filled 2015-01-04: qty 100

## 2015-01-04 MED ORDER — THIAMINE HCL 100 MG/ML IJ SOLN
Freq: Once | INTRAVENOUS | Status: AC
  Administered 2015-01-04: 16:00:00 via INTRAVENOUS
  Filled 2015-01-04 (×2): qty 1000

## 2015-01-04 MED ORDER — LORAZEPAM 1 MG PO TABS
1.0000 mg | ORAL_TABLET | Freq: Four times a day (QID) | ORAL | Status: AC | PRN
  Administered 2015-01-05 – 2015-01-07 (×6): 1 mg via ORAL
  Filled 2015-01-04 (×7): qty 1

## 2015-01-04 MED ORDER — VITAMIN B-1 100 MG PO TABS
ORAL_TABLET | ORAL | Status: AC
  Administered 2015-01-04: 100 mg via ORAL
  Filled 2015-01-04: qty 1

## 2015-01-04 MED ORDER — THIAMINE HCL 100 MG/ML IJ SOLN
INTRAMUSCULAR | Status: AC
Start: 2015-01-04 — End: 2015-01-04
  Administered 2015-01-04: 100 mg via INTRAVENOUS
  Filled 2015-01-04: qty 2

## 2015-01-04 MED ORDER — LORAZEPAM 2 MG/ML IJ SOLN
0.5000 mg | Freq: Once | INTRAMUSCULAR | Status: AC
  Administered 2015-01-04: 0.5 mg via INTRAVENOUS

## 2015-01-04 MED ORDER — VITAMIN B-1 100 MG PO TABS
100.0000 mg | ORAL_TABLET | Freq: Every day | ORAL | Status: DC
  Administered 2015-01-04 – 2015-01-13 (×9): 100 mg via ORAL
  Filled 2015-01-04 (×11): qty 1

## 2015-01-04 MED ORDER — ACETAMINOPHEN 325 MG PO TABS
650.0000 mg | ORAL_TABLET | Freq: Four times a day (QID) | ORAL | Status: DC | PRN
  Filled 2015-01-04: qty 2

## 2015-01-04 MED ORDER — ONDANSETRON HCL 4 MG/2ML IJ SOLN
4.0000 mg | Freq: Once | INTRAMUSCULAR | Status: AC
  Administered 2015-01-04: 4 mg via INTRAVENOUS

## 2015-01-04 MED ORDER — THIAMINE HCL 100 MG/ML IJ SOLN
100.0000 mg | Freq: Every day | INTRAMUSCULAR | Status: DC
  Administered 2015-01-04: 100 mg via INTRAVENOUS
  Filled 2015-01-04: qty 2
  Filled 2015-01-04 (×2): qty 1

## 2015-01-04 NOTE — Plan of Care (Addendum)
Problem: Discharge Progression Outcomes Goal: Discharge plan in place and appropriate Outcome: Progressing Individualization: 1. Lives at home with mother and her 2 children. 2. Admits to ETOH abuse and verbalizes understanding she is in withdrawal. 3. Reports has lost 13 lbs in past month;only able to tolerate full liquid diet. 4. HIGH risk for fall- reports multiple falls at home-bed alarm activated with education done.  Medical history: substance abuse- ETOH Goal: Other Discharge Outcomes/Goals Outcome: Progressing Progress to goal: 1. Denies pain currently. 2. K+ 3.2 with IVF's with K+ started and infusing readily. 3. CIWA initiated and currently controlled on ativan IV. Continues generalized tremors. 4. Tolerating clear liquid diet; no n/v. 5. Up to BR with SB+-generalized weakness;slightly unsteady gait.

## 2015-01-04 NOTE — ED Notes (Signed)
Adm md with pt

## 2015-01-04 NOTE — H&P (Signed)
[facility] [place] NAME: Kristine Macias    MR#:  [id number]  DATE OF BIRTH:  Nov 18, 1985  DATE OF ADMISSION:  01/04/2015  PRIMARY CARE PHYSICIAN: NONE   REQUESTING/REFERRING PHYSICIAN: Dr Carter Kitten  CHIEF COMPLAINT:  Nausea vomitng and weight loss of about 30 lbs in 1-1/2 months  HISTORY OF PRESENT ILLNESS:  Kristine Macias  is a 29 y.o. female with a known history of generalized anxiety disorder severe alcohol dependence comes in the emergency room with increasing nausea vomiting and unable to keep anything orally for last several days. Patient has been in the emergency room on and off for several times. She received IV fluids nausea medicine and was discharged.  In the emergency room patient appears to be very cachectic and dehydrated and workup in the ER including CT scan of the abdomen shows pancolitis. Patient denies any history of ulcerative colitis or Crohn's disease in her as well as her family. She is being admitted for further evaluate her management.   Patient recently has been undergoing significant social stress where her friend/husband is not supporting her and she has been going back to drinking about 7-8 beers on a daily basis to fight her anxiety. She denies any suicidal ideation. She lives with her mother she has 2 children 73 and 97 years old.  PAST MEDICAL HISTORY:   Past Medical History  Diagnosis Date  . Anxiety   . Chlamydia 2007  . Irregular heart beat 2010    wt/diet related after evaluation  . Renal disorder   . Pneumothorax, spontaneous, tension   . Alcohol abuse     PAST SURGICAL HISTOIRY:   Past Surgical History  Procedure Laterality Date  . Cesarean section    . Chest tube insertion      SOCIAL HISTORY:   History  Substance Use Topics  . Smoking status: Current Every Day Smoker -- 0.25 packs/day    Types: Cigarettes  . Smokeless tobacco: Never Used  . Alcohol Use: 21.6 oz/week    36  Cans of beer per week    FAMILY HISTORY:   Family History  Problem Relation Age of Onset  . Anesthesia problems Neg Hx   . Thyroid disease Mother   . Cancer Father   . Stroke Other     DRUG ALLERGIES:   Allergies  Allergen Reactions  . Sulfa Antibiotics Anaphylaxis  . Tetracyclines & Related Anaphylaxis  . Latex Hives and Itching    REVIEW OF SYSTEMS:  Review of Systems  Constitutional: Positive for weight loss and malaise/fatigue. Negative for fever, chills and diaphoresis.  HENT: Negative for congestion, ear pain, hearing loss, nosebleeds and sore throat.   Eyes: Negative for blurred vision, double vision, photophobia and pain.  Respiratory: Negative for hemoptysis, sputum production, wheezing and stridor.   Cardiovascular: Negative for orthopnea, claudication and leg swelling.  Gastrointestinal: Positive for nausea, vomiting and abdominal pain. Negative for heartburn and diarrhea.  Genitourinary: Negative for dysuria and frequency.  Musculoskeletal: Negative for back pain, joint pain and neck pain.  Skin: Negative for rash.  Neurological: Positive for weakness. Negative for tingling, sensory change, speech change, focal weakness, seizures and headaches.  Endo/Heme/Allergies: Does not bruise/bleed easily.  Psychiatric/Behavioral: Negative for suicidal ideas, memory loss and substance abuse. The patient is nervous/anxious.      MEDICATIONS AT HOME:   Prior to Admission medications   Medication Sig Start Date End Date Taking? Authorizing Provider  gabapentin (NEURONTIN) 300 MG  capsule Take 1 capsule (300 mg total) by mouth 3 (three) times daily. Patient not taking: Reported on 01/04/2015 11/26/14   Clarene Reamer, MD  HYDROcodone-acetaminophen [facility]) 5-325 MG per tablet Take 1-2 tablets by mouth every 4 (four) hours as needed for pain. Patient not taking: Reported on 11/24/2014 11/24/12   Hazel Sams, PA-C      VITAL SIGNS:  Blood pressure 129/100, pulse 110,  temperature 98.8 F (37.1 C), temperature source Oral, resp. rate 20, height _0  (1.676 m), weight 37.649 kg (83 lb), SpO2 97 %.  PHYSICAL EXAMINATION:  GENERAL:  29 y.o.-year-old patient lying in the bed with no acute distress. cachectic EYES: Pupils equal, round, reactive to light and accommodation. No scleral icterus. Extraocular muscles intact.  HEENT: Head atraumatic, normocephalic. Oropharynx and nasopharynx clear.  NECK:  Supple, no jugular venous distention. No thyroid enlargement, no tenderness.  LUNGS: Normal breath sounds bilaterally, no wheezing, rales,rhonchi or crepitation. No use of accessory muscles of respiration.  CARDIOVASCULAR: S1, S2 normal. No murmurs, rubs, or gallops.  ABDOMEN: Soft, nontender, nondistended. Bowel sounds present. No organomegaly or mass.  EXTREMITIES: No pedal edema, cyanosis, or clubbing.  NEUROLOGIC: Cranial nerves II through XII are intact. Muscle strength 5/5 in all extremities. Sensation intact. Gait not checked.  PSYCHIATRIC: The patient is alert and oriented x 3. Appears anxious SKIN: No obvious rash, lesion, or ulcer.   LABORATORY PANEL:   CBC  Recent Labs Lab 01/04/15 1145  WBC 5.5  HGB 14.0  HCT 40.4  PLT 191   ------------------------------------------------------------------------------------------------------------------  Chemistries   Recent Labs Lab 01/04/15 1145  NA 134*  K 3.2*  CL 95*  CO2 26  GLUCOSE 117*  BUN 10  CREATININE 0.52  CALCIUM 8.1*  AST 431*  ALT 145*  ALKPHOS 223*  BILITOT 1.0   ------------------------------------------------------------------------------------------------------------------  Cardiac Enzymes No results for input(s): TROPONINI in the last 168 hours. ------------------------------------------------------------------------------------------------------------------  RADIOLOGY:  Ct Abdomen Pelvis W Contrast  01/04/2015   CLINICAL DATA:  29 year old female with difficulty with  eating and keeping food down over the past 2 months. Weight loss. History of alcohol abuse. Initial encounter.  EXAM: CT ABDOMEN AND PELVIS WITH CONTRAST  TECHNIQUE: Multidetector CT imaging of the abdomen and pelvis was performed using the standard protocol following bolus administration of intravenous contrast.  CONTRAST:  143m OMNIPAQUE IOHEXOL 300 MG/ML  SOLN  COMPARISON:  11/02/2013 and 10/28/2013.  FINDINGS: Abnormal appearance of the colon most notable involving the transverse colon, ascending colon and rectosigmoid junction. Colitis may be explained by inflammatory or infectious process. Ischemic process felt unlikely. The appendix does not appear inflamed. Small bowel without discrete abnormality. No free air. No abnormal localized fluid collection.  Enlarged diffuse fatty infiltration of the liver. This distorts and displaces surrounding structures.  Enlarged gallbladder without calcified gallstones.  Enlarged common bile duct measuring 8 mm versus prior 7 mm. Cause indeterminate. Discrete pancreatic head mass or calcified common bile duct stone not identified. Pancreatic duct top-normal in size.  No worrisome hepatic, splenic, renal or adrenal lesion.  No abdominal aortic aneurysm or vessel narrowing.  Decompressed urinary bladder unremarkable. Small amount of fluid endometrial region is a nonspecific finding. Ovaries unremarkable for patient's age.  Lung bases clear.  No osseous abnormality detected.  IMPRESSION: Abnormal appearance of the colon most notable involving the transverse colon, ascending colon and rectosigmoid junction. Colitis may be explained by inflammatory or infectious process. The appendix does not appear inflamed. Small bowel without discrete abnormality.  Enlarged  diffuse fatty infiltration of the liver. This distorts and displaces surrounding structures.  Enlarged gallbladder without calcified gallstones.  Enlarged common bile duct measuring 8 mm versus prior 7 mm. Cause  indeterminate. Discrete pancreatic head mass or calcified common bile duct stone not identified. Pancreatic duct top-normal in size.   Electronically Signed   By: Genia Del M.D.   On: 01/04/2015 17:11    IMPRESSION AND PLAN:  29 year old Kristine Macias with past medical history of generalized anxiety disorder, severe alcohol dependence history of alcohol-induced hepatitis comes into the emergency room with #1  generalized abdominal pain with nausea and vomiting and significant weight loss of 30 pounds in 1-1/2 months. Patient will be admitted to medical floor. Continue IV fluids for hydration Clear liquid diet advanced to full as tolerated. GI consultation case discussed with Dr. Rayann Heman. Recommends stool studies in the form of C. difficile stool cultures. We'll check ESR and CRP.  #2 abnormal CT scan showing pancolitis Dr. Rayann Heman to do colonoscopy most likely on Thursday    #3 alcohol dependence with ongoing alcohol abuse. Patient drinks about 7-8 beers a day.  we'll put her on alcohol withdrawal protocol. Substance abuse counseling done. Consider psychiatry consultation if needed.   #4 generalized anxiety disorder patient reports she has been prescribed Xanax through [facility]. I will consult psychiatry for help treating generalized anxiety disorder.  #5 DVT prophylaxis subcutaneous heparin 3 times a day    All  the records are reviewed and case discussed with ED provider. Management plans discussed with the patient, family and they are in agreement.  CODE STATUS:FULL CODE  TOTAL TIME TAKING CARE OF THIS PATIENT: 50 minutes.    Stark Aguinaga M.D on 01/04/2015 at 7:11 PM  Between 7am to 6pm - Pager - [phone]  After 6pm go to [email or web address] - password [email or web address] [facility]  Office  [phone]  CC: Primary care physician; PROVIDER NOT IN SYSTEM

## 2015-01-04 NOTE — ED Notes (Signed)
Patient transported to CT 

## 2015-01-04 NOTE — ED Notes (Signed)
Pt reports that she has not been able to keep any food down x 2 months and since has lost approx 30lbs. States that she was seen here last week.

## 2015-01-04 NOTE — ED Provider Notes (Signed)
[facility] Emergency Department Provider Note    ____________________________________________  Time seen: 4854  I have reviewed the triage vital signs and the nursing notes.   HISTORY  Chief Complaint Nausea and Emesis   History limited by: Not Limited   HPI Kristine Macias is a 29 y.o. female who presents to the emergency department today because of continued nausea, vomiting and weight loss. He patient states that she has been losing weight for roughly 1 month. Has lost roughly 30 pounds. She has persistent nausea and vomiting especially when eating. Patient states she has not been able to eat and keep down much food. The patient was seen in the emergency department recently for similar symptoms and was given antiemetics. The patient states antibiotics not been working. Patient denies any fevers. Patient denies any self-induced vomiting. Patient denies any depression although she has had recent stressors in her life.     Past Medical History  Diagnosis Date  . Anxiety   . Chlamydia 2007  . Irregular heart beat 2010    wt/diet related after evaluation  . Renal disorder   . Pneumothorax, spontaneous, tension   . Alcohol abuse     Patient Active Problem List   Diagnosis Date Noted  . Generalized anxiety disorder 11/26/2014    Class: Chronic  . Alcohol use disorder, severe, dependence 11/25/2014  . Pelvic pain in pregnancy 04/10/2012  . Vaginitis 04/10/2012  . Adnexal mass 04/10/2012    Past Surgical History  Procedure Laterality Date  . Cesarean section    . Chest tube insertion      Current Outpatient Rx  Name  Route  Sig  Dispense  Refill  . gabapentin (NEURONTIN) 300 MG capsule   Oral   Take 1 capsule (300 mg total) by mouth 3 (three) times daily.   90 capsule   1   . HYDROcodone-acetaminophen (NORCO) 5-325 MG per tablet   Oral   Take 1-2 tablets by mouth every 4 (four) hours as needed for pain. Patient not taking: Reported  on 11/24/2014   20 tablet   0     Allergies Sulfa antibiotics; Tetracyclines & related; and Latex  Family History  Problem Relation Age of Onset  . Anesthesia problems Neg Hx   . Thyroid disease Mother   . Cancer Father   . Stroke Other     Social History History  Substance Use Topics  . Smoking status: Current Every Day Smoker -- 0.25 packs/day    Types: Cigarettes  . Smokeless tobacco: Never Used  . Alcohol Use: 21.6 oz/week    36 Cans of beer per week    Review of Systems  Constitutional: Negative for fever. Cardiovascular: Negative for chest pain. Respiratory: Negative for shortness of breath. Gastrointestinal: Positive for abdominal pain, vomiting  Genitourinary: Negative for dysuria. Musculoskeletal: Negative for back pain. Skin: Negative for rash. Neurological: Negative for headaches, focal weakness or numbness.   10-point ROS otherwise negative.  ____________________________________________   PHYSICAL EXAM:  VITAL SIGNS: ED Triage Vitals  Enc Vitals Group     BP --      Pulse Rate 01/04/15 1134 100     Resp 01/04/15 1134 18     Temp 01/04/15 1134 98.8 F (37.1 C)     Temp Source 01/04/15 1134 Oral     SpO2 01/04/15 1134 97 %     Weight 01/04/15 1134 83 lb (37.649 kg)     Height 01/04/15 1134 5' 6"  (1.676 m)  Head Cir --      Peak Flow --      Pain Score 01/04/15 1143 6     Pain Loc --      Pain Edu? --      Excl. in [place]? --      Constitutional: Alert and oriented. Emaciated, temporal wasting Eyes: Conjunctivae are normal. PERRL. Normal extraocular movements. ENT   Head: Normocephalic and atraumatic.   Nose: No congestion/rhinnorhea.   Mouth/Throat: Mucous membranes are moist.   Neck: No stridor. Hematological/Lymphatic/Immunilogical: No cervical lymphadenopathy. Cardiovascular: Normal rate, regular rhythm.  No murmurs, rubs, or gallops. Respiratory: Normal respiratory effort without tachypnea nor retractions. Breath  sounds are clear and equal bilaterally. No wheezes/rales/rhonchi. Gastrointestinal: Soft , diffusely tender.. No distention. Right-sided CVA tenderness Genitourinary: Deferred Musculoskeletal: Normal range of motion in all extremities. No joint effusions.  No lower extremity tenderness nor edema. Neurologic:  Normal speech and language. No gross focal neurologic deficits are appreciated. Speech is normal.  Skin:  Skin is warm, dry and intact. No rash noted. Psychiatric: Mood and affect are normal. Speech and behavior are normal. Patient exhibits appropriate insight and judgment.  ____________________________________________    LABS (pertinent positives/negatives)  Labs Reviewed  CBC WITH DIFFERENTIAL/PLATELET - Abnormal; Notable for the following:    MCV 100.3 (*)    MCH 34.7 (*)    RDW 20.9 (*)    All other components within normal limits  COMPREHENSIVE METABOLIC PANEL - Abnormal; Notable for the following:    Sodium 134 (*)    Potassium 3.2 (*)    Chloride 95 (*)    Glucose, Bld 117 (*)    Calcium 8.1 (*)    Total Protein 6.1 (*)    Albumin 3.0 (*)    AST 431 (*)    ALT 145 (*)    Alkaline Phosphatase 223 (*)    All other components within normal limits  URINALYSIS COMPLETEWITH MICROSCOPIC ([facility])  - Abnormal; Notable for the following:    Color, Urine AMBER (*)    APPearance HAZY (*)    Glucose, UA 50 (*)    Ketones, ur TRACE (*)    Hgb urine dipstick 1+ (*)    Protein, ur 30 (*)    Bacteria, UA RARE (*)    Squamous Epithelial / LPF 0-5 (*)    All other components within normal limits  HCG, QUANTITATIVE, PREGNANCY  TSH  HIV ANTIBODY (ROUTINE TESTING)  POC URINE PREG, ED  POCT PREGNANCY, URINE     ____________________________________________   EKG  None  ____________________________________________    RADIOLOGY  CT abdomen, pelvis  IMPRESSION: Abnormal appearance of the colon most notable involving the transverse colon, ascending colon and  rectosigmoid junction. Colitis may be explained by inflammatory or infectious process. The appendix does not appear inflamed. Small bowel without discrete abnormality.  Enlarged diffuse fatty infiltration of the liver. This distorts and displaces surrounding structures.  Enlarged gallbladder without calcified gallstones.  Enlarged common bile duct measuring 8 mm versus prior 7 mm. Cause indeterminate. Discrete pancreatic head mass or calcified common bile duct stone not identified. Pancreatic duct top-normal in size.  ____________________________________________   PROCEDURES  Procedure(s) performed: None  Critical Care performed: No  ____________________________________________   INITIAL IMPRESSION / ASSESSMENT AND PLAN / ED COURSE  Pertinent labs & imaging results that were available during my care of the patient were reviewed by me and considered in my medical decision making (see chart for details).  Patient because of continued nausea vomiting  and weight loss. Patient was recently seen in the emergency department with a negative right upper quadrant ultrasound. We will add on a TSH and HIV. Will get CT scan given abdominal tenderness.  ----------------------------------------- 5:30 PM on 01/04/2015 -----------------------------------------  Patient CT back and shows a normal appearance of the colon including transverse, descending and rectosigmoid, which might be concerning for colitis. Additionally patient with a large common bile duct. Given abnormal findings and state of patient think should best be served by inpatient admission for further workup and possible GI consultation.  ____________________________________________   FINAL CLINICAL IMPRESSION(S) / ED DIAGNOSES  Final diagnoses:  Weight loss  Nausea and vomiting, vomiting of unspecified type  Colitis  Transaminitis     Nance Pear, MD 01/04/15 2249

## 2015-01-05 DIAGNOSIS — F1010 Alcohol abuse, uncomplicated: Secondary | ICD-10-CM | POA: Insufficient documentation

## 2015-01-05 DIAGNOSIS — E43 Unspecified severe protein-calorie malnutrition: Secondary | ICD-10-CM | POA: Insufficient documentation

## 2015-01-05 LAB — SEDIMENTATION RATE: Sed Rate: 5 mm/hr (ref 0–20)

## 2015-01-05 LAB — C-REACTIVE PROTEIN: CRP: 1.2 mg/dL — ABNORMAL HIGH (ref <1.0)

## 2015-01-05 LAB — C DIFFICILE QUICK SCREEN W PCR REFLEX
C Diff antigen: NEGATIVE
C Diff interpretation: NEGATIVE
C Diff toxin: NEGATIVE

## 2015-01-05 LAB — HIV ANTIBODY (ROUTINE TESTING W REFLEX): HIV Screen 4th Generation wRfx: NONREACTIVE

## 2015-01-05 MED ORDER — POLYETHYLENE GLYCOL 3350 17 GM/SCOOP PO POWD
1.0000 | Freq: Once | ORAL | Status: AC
  Administered 2015-01-05: 255 g via ORAL
  Filled 2015-01-05 (×2): qty 255

## 2015-01-05 MED ORDER — VENLAFAXINE HCL ER 37.5 MG PO CP24
37.5000 mg | ORAL_CAPSULE | Freq: Every day | ORAL | Status: DC
  Filled 2015-01-05 (×3): qty 1

## 2015-01-05 MED ORDER — BOOST / RESOURCE BREEZE PO LIQD
1.0000 | Freq: Three times a day (TID) | ORAL | Status: DC
  Administered 2015-01-06 – 2015-01-12 (×13): 1 via ORAL

## 2015-01-05 NOTE — Plan of Care (Signed)
Problem: Discharge Progression Outcomes Goal: Discharge plan in place and appropriate Individualization: 1. Lives at home with mother and her 2 children. 2. Admits to ETOH abuse and verbalizes understanding she is in withdrawal. 3. Reports has lost 13 lbs in past month;only able to tolerate full liquid diet. 4. HIGH risk for fall- reports multiple falls at home-bed alarm activated with education done.    Goal: Other Discharge Outcomes/Goals Plan of care progress to goal for: Nausea and Vomiting - Zofran given for nausea with improvement. - Continues on CIWA, ativan given x1 per CIWA scale. - Assist to BR. - Continues on IV fluids.

## 2015-01-05 NOTE — Plan of Care (Signed)
Problem: Discharge Progression Outcomes Goal: Other Discharge Outcomes/Goals Outcome: Progressing Pt receiving IVF. Ativan given 1x PO for CIWA-AR. No c/o pain. Pt continues to have black, green tarry diarrhea. Plan for EGD and colonoscopy tomorrow.

## 2015-01-05 NOTE — Progress Notes (Signed)
[facility] NAME: Kristine Macias    MR#:  [id number]  DATE OF BIRTH:  Oct 02, 1985  SUBJECTIVE:  CHIEF COMPLAINT:  Diarrhea  Still with diarrhea, lost 30 lbs in app 6 wks , no nausea and vomitng today  REVIEW OF SYSTEMS:  CONSTITUTIONAL: No fever,has  fatigue and weakness.  EYES: No blurred or double vision.  EARS, NOSE, AND THROAT: No tinnitus or ear pain.  RESPIRATORY: No cough, shortness of breath, wheezing or hemoptysis.  CARDIOVASCULAR: No chest pain, orthopnea, edema.  GASTROINTESTINAL: No nausea, vomiting, has diarrhea and gen  abdominal pain.  GENITOURINARY: No dysuria, hematuria.  ENDOCRINE: No polyuria, nocturia,  HEMATOLOGY: No anemia, easy bruising or bleeding SKIN: No rash or lesion. MUSCULOSKELETAL: No joint pain or arthritis.   NEUROLOGIC: No tingling, numbness, weakness.  PSYCHIATRY: No depression. Has anxiety  DRUG ALLERGIES:   Allergies  Allergen Reactions  . Sulfa Antibiotics Anaphylaxis  . Tetracyclines & Related Anaphylaxis  . Latex Hives and Itching    VITALS:  Blood pressure 123/80, pulse 79, temperature 98.5 F (36.9 C), temperature source Oral, resp. rate 18, height 5' 6"  (1.676 m), weight 40.96 kg (90 lb 4.8 oz), last menstrual period 11/24/2014, SpO2 100 %.  PHYSICAL EXAMINATION:  GENERAL:  30 y.o.-year-old patient lying in the bed with no acute distress. Emsciated EYES: Pupils equal, round, reactive to light and accommodation. No scleral icterus. Extraocular muscles intact.  HEENT: Head atraumatic, normocephalic. Oropharynx and nasopharynx clear.  NECK:  Supple, no jugular venous distention. No thyroid enlargement, no tenderness.  LUNGS: Normal breath sounds bilaterally, no wheezing, rales,rhonchi or crepitation. No use of accessory muscles of respiration.  CARDIOVASCULAR: S1, S2 normal. No murmurs, rubs, or gallops.  ABDOMEN: Soft, gen tender,no rebound , nondistended. Bowel sounds  present.  EXTREMITIES: No pedal edema, cyanosis, or clubbing.  NEUROLOGIC: Cranial nerves II through XII are intact. Muscle strength 5/5 in all extremities. Sensation intact. Gait not checked.  PSYCHIATRIC: The patient is alert and oriented x 3.  SKIN: No obvious rash, lesion, or ulcer.    LABORATORY PANEL:   CBC  Recent Labs Lab 01/04/15 1145  WBC 5.5  HGB 14.0  HCT 40.4  PLT 191   ------------------------------------------------------------------------------------------------------------------  Chemistries   Recent Labs Lab 01/04/15 1145  NA 134*  K 3.2*  CL 95*  CO2 26  GLUCOSE 117*  BUN 10  CREATININE 0.52  CALCIUM 8.1*  MG 1.7  AST 431*  ALT 145*  ALKPHOS 223*  BILITOT 1.0   ------------------------------------------------------------------------------------------------------------------  Cardiac Enzymes No results for input(s): TROPONINI in the last 168 hours. ------------------------------------------------------------------------------------------------------------------  RADIOLOGY:  Ct Abdomen Pelvis W Contrast  01/04/2015   CLINICAL DATA:  29 year old female with difficulty with eating and keeping food down over the past 2 months. Weight loss. History of alcohol abuse. Initial encounter.  EXAM: CT ABDOMEN AND PELVIS WITH CONTRAST  TECHNIQUE: Multidetector CT imaging of the abdomen and pelvis was performed using the standard protocol following bolus administration of intravenous contrast.  CONTRAST:  138m OMNIPAQUE IOHEXOL 300 MG/ML  SOLN  COMPARISON:  11/02/2013 and 10/28/2013.  FINDINGS: Abnormal appearance of the colon most notable involving the transverse colon, ascending colon and rectosigmoid junction. Colitis may be explained by inflammatory or infectious process. Ischemic process felt unlikely. The appendix does not appear inflamed. Small bowel without discrete abnormality. No free air. No abnormal localized fluid collection.  Enlarged diffuse fatty  infiltration of the liver. This distorts and displaces surrounding  structures.  Enlarged gallbladder without calcified gallstones.  Enlarged common bile duct measuring 8 mm versus prior 7 mm. Cause indeterminate. Discrete pancreatic head mass or calcified common bile duct stone not identified. Pancreatic duct top-normal in size.  No worrisome hepatic, splenic, renal or adrenal lesion.  No abdominal aortic aneurysm or vessel narrowing.  Decompressed urinary bladder unremarkable. Small amount of fluid endometrial region is a nonspecific finding. Ovaries unremarkable for patient's age.  Lung bases clear.  No osseous abnormality detected.  IMPRESSION: Abnormal appearance of the colon most notable involving the transverse colon, ascending colon and rectosigmoid junction. Colitis may be explained by inflammatory or infectious process. The appendix does not appear inflamed. Small bowel without discrete abnormality.  Enlarged diffuse fatty infiltration of the liver. This distorts and displaces surrounding structures.  Enlarged gallbladder without calcified gallstones.  Enlarged common bile duct measuring 8 mm versus prior 7 mm. Cause indeterminate. Discrete pancreatic head mass or calcified common bile duct stone not identified. Pancreatic duct top-normal in size.   Electronically Signed   By: Genia Del M.D.   On: 01/04/2015 17:11    EKG:   Orders placed or performed during the hospital encounter of 06/13/12  . ED EKG  . ED EKG  . EKG 12-Lead  . EKG 12-Lead  . EKG    ASSESSMENT AND PLAN:   29 year old Kristine Macias with past medical history of generalized anxiety disorder, severe alcohol dependence history of alcohol-induced hepatitis comes into the emergency room with #1 generalized abdominal pain with nausea and vomiting and significant weight loss of 30 pounds in 1-1/2 months.  Clears/ npo after mn Continue IV fluids for hydration. GI -Dr. Rayann Heman for EGD /Colonoscopy in am .stool studies, C.  difficile stool cultures. We'll check ESR and CRP.  #2 abnormal CT scan showing pancolitis and enlarged CBD Dr. Rayann Heman to do colonoscopy  on Thursday   #3 alcohol dependence with ongoing alcohol abuse. Patient drinks about 7-8 beers a day. we'll put her on alcohol withdrawal protocol.CIWA Substance abuse counseling done. Consider psychiatry consultation if needed.   #4 generalized anxiety disorder patient reports she has been prescribed Xanax through [facility].  psychiatry  Consult is pending  for help treating generalized anxiety disorder.  #5 DVT prophylaxis - pt is ambulatory ,  D/c subcutaneous heparin , she is refusing      All the records are reviewed and case discussed with Care Management/Social Workerr. Management plans discussed with the patient, family and they are in agreement CODE STATUS: full  TOTAL TIME TAKING CARE OF THIS PATIENT: 35 minutes.   POSSIBLE D/C IN 1-2  DAYS, DEPENDING ON CLINICAL CONDITION.   Nicholes Mango M.D on 01/05/2015 at 7:46 PM  Between 7am to 6pm - Pager - [phone] After 6pm go to [email or web address] - password [email or web address] [facility]  Office  [phone]  CC: Primary care physician; PROVIDER NOT IN SYSTEM

## 2015-01-05 NOTE — Consult Note (Deleted)
GI Inpatient Consult Note  Reason for Consult: pan-colitis on CT, diarrhea.    Attending Requesting Consult:  History of Present Illness: Kristine Macias is a 29 y.o. female  Past Medical History:  Past Medical History  Diagnosis Date  . Anxiety   . Chlamydia 2007  . Irregular heart beat 2010    wt/diet related after evaluation  . Renal disorder   . Pneumothorax, spontaneous, tension   . Alcohol abuse     Problem List: Patient Active Problem List   Diagnosis Date Noted  . Protein-calorie malnutrition, severe 01/05/2015  . Pancolitis 01/04/2015  . Generalized anxiety disorder 11/26/2014    Class: Chronic  . Alcohol use disorder, severe, dependence 11/25/2014  . Pelvic pain in pregnancy 04/10/2012  . Vaginitis 04/10/2012  . Adnexal mass 04/10/2012    Past Surgical History: Past Surgical History  Procedure Laterality Date  . Cesarean section    . Chest tube insertion      Allergies: Allergies  Allergen Reactions  . Sulfa Antibiotics Anaphylaxis  . Tetracyclines & Related Anaphylaxis  . Latex Hives and Itching    Home Medications: Prescriptions prior to admission  Medication Sig Dispense Refill Last Dose  . gabapentin (NEURONTIN) 300 MG capsule Take 1 capsule (300 mg total) by mouth 3 (three) times daily. (Patient not taking: Reported on 01/04/2015) 90 capsule 1   . HYDROcodone-acetaminophen (NORCO) 5-325 MG per tablet Take 1-2 tablets by mouth every 4 (four) hours as needed for pain. (Patient not taking: Reported on 11/24/2014) 20 tablet 0 Completed Course at Unknown time   Home medication reconciliation was completed with the patient.   Scheduled Inpatient Medications:   . feeding supplement (RESOURCE BREEZE)  1 Container Oral TID WC  . folic acid  1 mg Oral Daily  . heparin  5,000 Units Subcutaneous 3 times per day  . multivitamin with minerals  1 tablet Oral Daily  . thiamine  100 mg Oral Daily   Or  . thiamine  100 mg Intravenous Daily    Continuous  Inpatient Infusions:   . 0.9 % NaCl with KCl 20 mEq / L 100 mL/hr at 01/05/15 0542    PRN Inpatient Medications:  acetaminophen **OR** acetaminophen, LORazepam **OR** LORazepam, ondansetron **OR** ondansetron (ZOFRAN) IV  Family History: family history includes Cancer in her father; Stroke in her other; Thyroid disease in her mother. There is no history of Anesthesia problems.  The patient's family history is negative for inflammatory bowel disorders, GI malignancy, or solid organ transplantation.  Social History:   reports that she has been smoking Cigarettes.  She has been smoking about 0.25 packs per day. She has never used smokeless tobacco. She reports that she drinks about 21.6 oz of alcohol per week. She reports that she does not use illicit drugs. The patient denies ETOH, tobacco, or drug use.   Review of Systems: Constitutional: Weight is stable.  Eyes: No changes in vision. ENT: No oral lesions, sore throat.  GI: see HPI.  Heme/Lymph: No easy bruising.  CV: No chest pain.  GU: No hematuria.  Integumentary: No rashes.  Neuro: No headaches.  Psych: No depression/anxiety.  Endocrine: No heat/cold intolerance.  Allergic/Immunologic: No urticaria.  Resp: No cough, SOB.  Musculoskeletal: No joint swelling.    Physical Examination: BP 114/75 mmHg  Pulse 93  Temp(Src) 98.1 F (36.7 C) (Oral)  Resp 18  Ht 5' 6"  (1.676 m)  Wt 90 lb 4.8 oz (40.96 kg)  BMI 14.58 kg/m2  SpO2 98%  LMP 11/24/2014 Gen: NAD, alert and oriented x 4 HEENT: PEERLA, EOMI, Neck: supple, no JVD or thyromegaly Chest: CTA bilaterally, no wheezes, crackles, or other adventitious sounds CV: RRR, no m/g/c/r Abd: soft, NT, ND, +BS in all four quadrants; no HSM, guarding, ridigity, or rebound tenderness Ext: no edema, well perfused with 2+ pulses, Skin: no rash or lesions noted Lymph: no LAD  Data: Lab Results  Component Value Date   WBC 5.5 01/04/2015   HGB 14.0 01/04/2015   HCT 40.4 01/04/2015    MCV 100.3* 01/04/2015   PLT 191 01/04/2015    Recent Labs Lab 01/04/15 1145  HGB 14.0   Lab Results  Component Value Date   NA 134* 01/04/2015   K 3.2* 01/04/2015   CL 95* 01/04/2015   CO2 26 01/04/2015   BUN 10 01/04/2015   CREATININE 0.52 01/04/2015   Lab Results  Component Value Date   ALT 145* 01/04/2015   AST 431* 01/04/2015   ALKPHOS 223* 01/04/2015   BILITOT 1.0 01/04/2015   No results for input(s): APTT, INR, PTT in the last 168 hours. Assessment/Plan: Ms. Igo is a 29 y.o. female   Recommendations:  Thank you for the consult. Please call with questions or concerns.  Haunani Dickard, Grace Blight, MD

## 2015-01-05 NOTE — Care Management (Signed)
Admitted to [facility] with the diagnosis of Pancolitis. Lives with mother, Belenda Cruise [phone]). No primary care physician, Currently on CIWA precautions. GI consult. Psychiatric consult.  Receiving Zofran for nausea/vomitting. Shelbie Ammons RN MSN Care Management [phone]

## 2015-01-05 NOTE — Consult Note (Signed)
[facility] Consult   Reason for Consult:  Consult for evaluation of anxiety and alcohol abuse in this 29 year old woman admitted for GI complaints Referring Physician:  gouru Patient Identification: Kristine Macias MRN:  [id number] Principal Diagnosis: Pancolitis Diagnosis:   Patient Active Problem List   Diagnosis Date Noted  . Protein-calorie malnutrition, severe [E43] 01/05/2015  . Pancolitis [K51.90] 01/04/2015  . Generalized anxiety disorder [F41.1] 11/26/2014    Class: Chronic  . Alcohol use disorder, severe, dependence [F10.20] 11/25/2014  . Pelvic pain in pregnancy [O26.899, R10.2] 04/10/2012  . Vaginitis [N76.0] 04/10/2012  . Adnexal mass [N94.9] 04/10/2012    Total Time spent with patient: 1 hour  Subjective:   Kristine Macias is a 29 y.o. female patient admitted with patient was admitted with nausea and vomiting and weight loss of unclear etiology. Consult for anxiety symptoms and alcohol abuse. Patient's chief complaint "I got sick".  HPI:  Patient admitted to the hospital because of nausea and vomiting and weight loss of unclear etiology. She says it's been going on since March. She lost her appetite and had been vomiting regularly. So she's lost about 13 pounds. The only thing she says that helped her with her vomiting was to drink beer. She's been increasing her use of alcohol although she claims she never uses more than about a 6 pack in a day. Denies that she is using any other recreational or nonprescribed drugs. She says that her anxiety symptoms have been worse for the last half a year or so. Got much worse after her father died in 2014-05-04. She describes chronic general anxiety. The worries that she notices are only related to the anxiety itself. She is not aware of any specific thoughts or obsessions. Denies that she is feeling depressed. Denies suicidal ideation. He does admit that she has difficulty sleeping both falling asleep and staying  asleep. Obviously appetite is decreased. She totally denies any suicidal ideation or homicidal ideation and denies any psychotic symptoms. She has not been receiving any kind of mental health treatment. Says that she's gone to several providers including having gone to [facility] outpatient clinic but has not arranged for any follow-up and has not been prescribed any medicine.  Past psychiatric history notable for anxiety symptoms that developed as an adolescent. No history of psychiatric hospitalization or suicide attempts. As an adolescent she was treated with serotonin reuptake inhibitors including Paxil and Zoloft both of which made her feel more anxious. Effexor was tried at an unknown dose with no benefit but no clear side effects. She says that after a couple years of taking Xanax regularly she was able to taper off of it. Was able to manage her anxiety with the help of cognitive behavioral therapy until her father died this last year.  Substance abuse history notable for problems with alcohol use. Her use of alcohol to her mind is a reaction to her anxiety. She's gone through detox in the past. He claims that she's had seizures and delirium tremens in the past although I can't find specific documentation yet. Denies that she abuses any other drugs. Doesn't sound like she's been in specific substance abuse treatment other than detox.  Medical history notable for significant weight loss but no known ongoing medical problems  Social history is that she has 2 young children with whom she lives along with her own mother. 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  Patient  has [facility] which has limited her options to get treatment.  Family history is positive for several people with anxiety disorders  Current medications are none    HPI Elements:   Quality:  Anxiety generalized without specific target. Severity:  Moderate to  severe. Timing:  Daily for the last 6 months worse in the last month. Duration:  Much of the day although intermittent. Context:  Death of her father last year was a severe blow to her. Chronic financial problems and stresses.  Past Medical History:  Past Medical History  Diagnosis Date  . Anxiety   . Chlamydia 2007  . Irregular heart beat 2010    wt/diet related after evaluation  . Renal disorder   . Pneumothorax, spontaneous, tension   . Alcohol abuse     Past Surgical History  Procedure Laterality Date  . Cesarean section    . Chest tube insertion     Family History:  Family History  Problem Relation Age of Onset  . Anesthesia problems Neg Hx   . Thyroid disease Mother   . Cancer Father   . Stroke Other    Social History:  History  Alcohol Use  . 21.6 oz/week  . 26 Cans of beer per week     History  Drug Use No    History   Social History  . Marital Status: Single    Spouse Name: N/A  . Number of Children: N/A  . Years of Education: N/A   Social History Main Topics  . Smoking status: Current Every Day Smoker -- 0.25 packs/day    Types: Cigarettes  . Smokeless tobacco: Never Used  . Alcohol Use: 21.6 oz/week    36 Cans of beer per week  . Drug Use: No  . Sexual Activity: Yes    Birth Control/ Protection: None   Other Topics Concern  . None   Social History Narrative   Additional Social History:                          Allergies:   Allergies  Allergen Reactions  . Sulfa Antibiotics Anaphylaxis  . Tetracyclines & Related Anaphylaxis  . Latex Hives and Itching    Labs:  Results for orders placed or performed during the hospital encounter of 01/04/15 (from the past 48 hour(s))  CBC with Differential     Status: Abnormal   Collection Time: 01/04/15 11:45 AM  Result Value Ref Range   WBC 5.5 3.6 - 11.0 K/uL   RBC 4.03 3.80 - 5.20 MIL/uL   Hemoglobin 14.0 12.0 - 16.0 g/dL   HCT 40.4 35.0 - 47.0 %   MCV 100.3 (H) 80.0 - 100.0 fL    MCH 34.7 (H) 26.0 - 34.0 pg   MCHC 34.5 32.0 - 36.0 g/dL   RDW 20.9 (H) 11.5 - 14.5 %   Platelets 191 150 - 440 K/uL   Neutrophils Relative % 75 %   Neutro Abs 4.1 1.4 - 6.5 K/uL   Lymphocytes Relative 20 %   Lymphs Abs 1.1 1.0 - 3.6 K/uL   Monocytes Relative 4 %   Monocytes Absolute 0.2 0.2 - 0.9 K/uL   Eosinophils Relative 0 %   Eosinophils Absolute 0.0 0 - 0.7 K/uL   Basophils Relative 1 %   Basophils Absolute 0.0 0 - 0.1 K/uL  Comprehensive metabolic panel     Status: Abnormal   Collection Time: 01/04/15 11:45 AM  Result Value Ref Range  Sodium 134 (L) 135 - 145 mmol/L   Potassium 3.2 (L) 3.5 - 5.1 mmol/L   Chloride 95 (L) 101 - 111 mmol/L   CO2 26 22 - 32 mmol/L   Glucose, Bld 117 (H) 65 - 99 mg/dL   BUN 10 6 - 20 mg/dL   Creatinine, Ser 0.52 0.44 - 1.00 mg/dL   Calcium 8.1 (L) 8.9 - 10.3 mg/dL   Total Protein 6.1 (L) 6.5 - 8.1 g/dL   Albumin 3.0 (L) 3.5 - 5.0 g/dL   AST 431 (H) 15 - 41 U/L   ALT 145 (H) 14 - 54 U/L   Alkaline Phosphatase 223 (H) 38 - 126 U/L   Total Bilirubin 1.0 0.3 - 1.2 mg/dL   GFR calc non Af Amer >60 >60 mL/min   GFR calc Af Amer >60 >60 mL/min    Comment: (NOTE) The eGFR has been calculated using the CKD EPI equation. This calculation has not been validated in all clinical situations. eGFR's persistently <60 mL/min signify possible Chronic Kidney Disease.    Anion gap 13 5 - 15  HIV antibody     Status: None   Collection Time: 01/04/15 11:45 AM  Result Value Ref Range   HIV Screen 4th Generation wRfx Non Reactive Non Reactive    Comment: (NOTE) Performed At: [facility] [place], [place] [id number] Lindon Romp MD [id number]   hCG, quantitative, pregnancy     Status: None   Collection Time: 01/04/15 11:45 AM  Result Value Ref Range   hCG, Beta Chain, Quant, S <1 <5 mIU/mL    Comment:          GEST. AGE      CONC.  (mIU/mL)   <=1 WEEK        5 - 50     2 WEEKS       50 - 500     3 WEEKS       100 -  10,000     4 WEEKS     1,000 - 30,000     5 WEEKS     3,500 - 115,000   6-8 WEEKS     12,000 - 270,000    12 WEEKS     15,000 - 220,000        FEMALE AND NON-PREGNANT FEMALE:     LESS THAN 5 mIU/mL   TSH     Status: None   Collection Time: 01/04/15 11:45 AM  Result Value Ref Range   TSH 2.725 0.350 - 4.500 uIU/mL  Magnesium     Status: None   Collection Time: 01/04/15 11:45 AM  Result Value Ref Range   Magnesium 1.7 1.7 - 2.4 mg/dL  Phosphorus     Status: None   Collection Time: 01/04/15 11:45 AM  Result Value Ref Range   Phosphorus 4.3 2.5 - 4.6 mg/dL  Pregnancy, urine POC     Status: None   Collection Time: 01/04/15  2:59 PM  Result Value Ref Range   Preg Test, Ur NEGATIVE NEGATIVE    Comment:        THE SENSITIVITY OF THIS METHODOLOGY IS >24 mIU/mL   Urinalysis complete, with microscopic [facility])     Status: Abnormal   Collection Time: 01/04/15  3:03 PM  Result Value Ref Range   Color, Urine AMBER (A) YELLOW   APPearance HAZY (A) CLEAR   Glucose, UA 50 (A) NEGATIVE mg/dL   Bilirubin Urine NEGATIVE NEGATIVE   Ketones, ur  TRACE (A) NEGATIVE mg/dL   Specific Gravity, Urine 1.024 1.005 - 1.030   Hgb urine dipstick 1+ (A) NEGATIVE   pH 5.0 5.0 - 8.0   Protein, ur 30 (A) NEGATIVE mg/dL   Nitrite NEGATIVE NEGATIVE   Leukocytes, UA NEGATIVE NEGATIVE   RBC / HPF 0-5 0 - 5 RBC/hpf   WBC, UA 0-5 0 - 5 WBC/hpf   Bacteria, UA RARE (A) NONE SEEN   Squamous Epithelial / LPF 0-5 (A) NONE SEEN   Mucous PRESENT    Hyaline Casts, UA PRESENT    Granular Casts, UA PRESENT   Sedimentation rate     Status: None   Collection Time: 01/04/15  9:28 PM  Result Value Ref Range   Sed Rate 5 0 - 20 mm/hr  Stool culture     Status: None (Preliminary result)   Collection Time: 01/04/15  9:50 PM  Result Value Ref Range   Specimen Description STOOL    Special Requests NONE    Culture NO SALMONELLA OR SHIGELLA ISOLATED    Report Status PENDING     Vitals: Blood pressure 114/75, pulse 93,  temperature 98.1 F (36.7 C), temperature source Oral, resp. rate 18, height 5' 6"  (1.676 m), weight 40.96 kg (90 lb 4.8 oz), last menstrual period 11/24/2014, SpO2 98 %.  Risk to Self: Is patient at risk for suicide?: No Risk to Others:   Prior Inpatient Therapy:   Prior Outpatient Therapy:    Current Facility-Administered Medications  Medication Dose Route Frequency Provider Last Rate Last Dose  . 0.9 % NaCl with KCl 20 mEq/ L  infusion   Intravenous Continuous Fritzi Mandes, MD 100 mL/hr at 01/05/15 0542    . acetaminophen (TYLENOL) tablet 650 mg  650 mg Oral Q6H PRN Fritzi Mandes, MD       Or  . acetaminophen (TYLENOL) suppository 650 mg  650 mg Rectal Q6H PRN Fritzi Mandes, MD      . feeding supplement (RESOURCE BREEZE) (RESOURCE BREEZE) liquid 1 Container  1 Container Oral TID WC Nicholes Mango, MD      . folic acid (FOLVITE) tablet 1 mg  1 mg Oral Daily Fritzi Mandes, MD   1 mg at 01/05/15 0926  . heparin injection 5,000 Units  5,000 Units Subcutaneous 3 times per day Fritzi Mandes, MD   5,000 Units at 01/05/15 0542  . LORazepam (ATIVAN) tablet 1 mg  1 mg Oral Q6H PRN Fritzi Mandes, MD       Or  . LORazepam (ATIVAN) injection 1 mg  1 mg Intravenous Q6H PRN Fritzi Mandes, MD   1 mg at 01/05/15 0235  . multivitamin with minerals tablet 1 tablet  1 tablet Oral Daily Fritzi Mandes, MD   1 tablet at 01/05/15 0927  . ondansetron (ZOFRAN) tablet 4 mg  4 mg Oral Q6H PRN Fritzi Mandes, MD       Or  . ondansetron (ZOFRAN) injection 4 mg  4 mg Intravenous Q6H PRN Fritzi Mandes, MD   4 mg at 01/05/15 0245  . thiamine (VITAMIN B-1) tablet 100 mg  100 mg Oral Daily Fritzi Mandes, MD   100 mg at 01/05/15 5188   Or  . thiamine (B-1) injection 100 mg  100 mg Intravenous Daily Fritzi Mandes, MD   100 mg at 01/04/15 2020    Musculoskeletal: Strength & Muscle Tone: within normal limits Gait & Station: normal Patient leans: N/A  Psychiatric Specialty Exam: Physical Exam  Constitutional: She appears cachectic. She has a  sickly  appearance.  HENT:  Head: Normocephalic.  Eyes: Conjunctivae are normal. Pupils are equal, round, and reactive to light.  Neck: Normal range of motion.  Respiratory: Effort normal.  Musculoskeletal: Normal range of motion.  Neurological: She is alert.  Skin: Skin is warm and dry.  Psychiatric: Her speech is normal and behavior is normal. Judgment and thought content normal. Her mood appears anxious. Cognition and memory are normal.    Review of Systems  Constitutional: Positive for weight loss.  HENT: Negative.   Eyes: Negative.   Respiratory: Negative.   Cardiovascular: Positive for palpitations.  Gastrointestinal: Positive for nausea and vomiting.  Genitourinary: Negative.   Musculoskeletal: Negative.   Neurological: Positive for weakness.  Psychiatric/Behavioral: Positive for substance abuse. Negative for depression, suicidal ideas, hallucinations and memory loss. The patient is nervous/anxious and has insomnia.     Blood pressure 114/75, pulse 93, temperature 98.1 F (36.7 C), temperature source Oral, resp. rate 18, height 5' 6"  (1.676 m), weight 40.96 kg (90 lb 4.8 oz), last menstrual period 11/24/2014, SpO2 98 %.Body mass index is 14.58 kg/(m^2).  General Appearance: Patient appears disheveled and also very obviously cachectic. Looks sickly.  Eye Contact::  Good  Speech:  Clear and Coherent  Volume:  Normal  Mood:  Mood is subjectively anxious although she appears mostly calm.  Affect:  Appropriate and Tearful  Thought Process:  Coherent  Orientation:  Full (Time, Place, and Person)  Thought Content:  Negative  Suicidal Thoughts:  No  Homicidal Thoughts:  No  Memory:  Immediate;   Good Recent;   Good Remote;   Good  Judgement:  Fair  Insight:  Lacking  Psychomotor Activity:  Normal  Concentration:  Good  Recall:  Good  Fund of Knowledge:Good  Language: Good  Akathisia:  No  Handed:  Right  AIMS (if indicated):     Assets:  Desire for  Improvement Housing Talents/Skills  ADL's:  Intact  Cognition: WNL  Sleep:      Medical Decision Making: New problem, with additional work up planned, Review of Psycho-Social Stressors (1), Review or order clinical lab tests (1), Decision to obtain old records (1), Review and summation of old records (2) and Review of Medication Regimen & Side Effects (2)  Treatment Plan Summary: Plan Patient is reporting anxiety symptoms that have been very limiting to her. On the other hand she is not suicidal and not psychotic. Physically she has been losing weight because of chronic nausea and vomiting. She's been drinking although she tends to minimize that. It's unclear how much the drinking has been a causative factor in her anxiety symptoms worsening.As far as the alcohol the patient has been counseled about the effect of alcohol on anxiety disorders and the importance of trying her best to discontinue the use of alcohol completely. Right now she does not appear to be showing any signs of oncoming delirium or shakiness. I would limit detox treatment to what is required. As far as anxiety she has been educated that the appropriate treatment is psychotherapy and serotonin reuptake inhibitors. Benzodiazepines would be contraindicated particularly because of her alcohol abuse. I would not start Xanax or any other benzodiazepine. Suggest that we try restarting Effexor at a very low dose. There is a possibility that she will have a much better response now that she is an adult.Patient needs to be referred for outpatient treatment. Request that social work assist with getting her list of providers although it sounds like she may abort he  made plans to go back to [facility] behavioral [facility].  Plan:  Patient does not meet criteria for psychiatric inpatient admission. Supportive therapy provided about ongoing stressors. Discussed crisis plan, support from social network, calling 911, coming to the Emergency Department, and  calling Suicide Hotline. patient will be followed follow patient as a Optometrist while hospitalized follow patient has a Optometrist while she is hospitalized Disposition:   Alethia Berthold 01/05/2015 1:35 PM

## 2015-01-05 NOTE — Progress Notes (Signed)
Initial Nutrition Assessment  DOCUMENTATION CODES:  Severe malnutrition in context of acute illness/injury  INTERVENTION: Medical Food Supplement Therapy: will recommend  Boost Breeze TID as pt currently on CL diet order  NUTRITION DIAGNOSIS:  Inadequate oral intake related to acute illness, inability to eat as evidenced by energy intake < or equal to 50% for > or equal to 1 month, severe depletion of body fat, severe depletion of muscle mass, pt currently on CL  GOAL: Goal for diet advancement and tolerance as medically able Patient will meet greater than or equal to 90% of their needs, Weight gain  MONITOR:  Digestive System Anthropometrics Electrolyte and renal Profile Hepatic Profile  REASON FOR ASSESSMENT:  Consult Assessment of nutrition requirement/status  ASSESSMENT:  Pt admitted with n/v and abdominal pain for the past month. GI consult pending. No vomitting this am per pt. Per MD note pt currently on CIWA s/p drinking 7-8 beers daily with possible alcohol induced hepatitis and pancoitis. PMHx: anxiety, renal disorder, ptx, renal insufficiency  PO Intake: pt reports tolerating CL this am, sips of broth only. Pt reports one month PTA unable to keep foods/liquids down. Pt reports not even tolerating one meal per day but sometimes smaller bland foods.  Pt reports trying supplement drink but could not keep down either. Pt reports prior to the past month eating was as usual. Pt reports usually eating small frequent meals throughout the day. Pt reports taking a multi-vitamin daily PTA.   Medications: Folic acid, MVI, thiamine, NS with KCl at 156m/hr Labs:  Electrolyte and Renal Profile:    Recent Labs Lab 01/04/15 1145  BUN 10  CREATININE 0.52  NA 134*  K 3.2*  MG 1.7  PHOS 4.3   Glucose Profile: No results for input(s): GLUCAP in the last 72 hours. Protein Profile:   Recent Labs Lab 01/04/15 1145  ALBUMIN 3.0*  Hepatic Profile: ALP 223, AST 431, ALT  145  Height:  Ht Readings from Last 1 Encounters:  01/04/15 5' 6"  (1.676 m)    Weight:  Wt Readings from Last 1 Encounters:  01/04/15 90 lb 4.8 oz (40.96 kg)   Pt reports weight of 110lbs 1 month ago and then dropping to 83lbs when admitted (25% weight loss); wProbation officernotes PTA in 2013 pt weight 99lbs. Current weight 90lbs.  Ideal Body Weight:  59 kg  Wt Readings from Last 10 Encounters:  01/04/15 90 lb 4.8 oz (40.96 kg)  04/24/12 99 lb 3.2 oz (44.997 kg)  04/10/12 99 lb 4.8 oz (45.042 kg)  08/31/11 94 lb 6.4 oz (42.82 kg)   Nutrition-Focused physical exam completed. Findings are severe fat depletion, moderate-severe muscle depletion notably severe leg muscle depletion, and no edema.   BMI:  Body mass index is 14.58 kg/(m^2).  Estimated Nutritional Needs:  Kcal:  1917-2237kcals, BEE: 1332kcals, (IF 1.2-1.4)(AF 1.2) using IBW of 59kg  Protein:  59-71g protein (1.0-1.2g/kg) using IBW of 59kg  Fluid:  1475-17780mof fluid (25-302mg) using IBW of 59kg  Skin: reviewed, no issues  Diet Order:  Diet clear liquid Room service appropriate?: Yes; Fluid consistency:: Thin  EDUCATION NEEDS:  No education needs identified at this time   Intake/Output Summary (Last 24 hours) at 01/05/15 1101 Last data filed at 01/05/15 0800  Gross per 24 hour  Intake    120 ml  Output    300 ml  Net   -180 ml    Last BM:  5/11 large brown stool  HIGH Care Level  [facility]  RD, LDN Pager [phone]

## 2015-01-05 NOTE — Consult Note (Signed)
GI Inpatient Consult Note  Reason for Consult:  Pan-colitis on CT, diarrhea, malnutrition.    Attending Requesting Consult: Kristine Mandes MD  History of Present Illness: Kristine Macias is a 29 y.o. female with PMHx anxiety, ETOH abuse presenting for severe weight loss, n/v, diarrhea.  Reports at least 30 lb wt loss since 10/2014.  Had miscarriage at that time and unable to eat with n/v, poor appetite since then.  Also has loose watery stools whenever she eats. If doesn't eat, has one stool per day.  No blood in stools but are on dark side.  No f/c.  No abd pain.   Does have elev liver enzymes with AST 3 x ALT. Does endorese heavy ETOH use and wants to quit.   No prev EGD or colonoscopy.l   Past Medical History:  Past Medical History  Diagnosis Date  . Anxiety   . Chlamydia 2007  . Irregular heart beat 2010    wt/diet related after evaluation  . Renal disorder   . Pneumothorax, spontaneous, tension   . Alcohol abuse     Problem List: Patient Active Problem List   Diagnosis Date Noted  . Protein-calorie malnutrition, severe 01/05/2015  . Pancolitis 01/04/2015  . Generalized anxiety disorder 11/26/2014    Class: Chronic  . Alcohol use disorder, severe, dependence 11/25/2014  . Pelvic pain in pregnancy 04/10/2012  . Vaginitis 04/10/2012  . Adnexal mass 04/10/2012    Past Surgical History: Past Surgical History  Procedure Laterality Date  . Cesarean section    . Chest tube insertion      Allergies: Allergies  Allergen Reactions  . Sulfa Antibiotics Anaphylaxis  . Tetracyclines & Related Anaphylaxis  . Latex Hives and Itching    Home Medications: Prescriptions prior to admission  Medication Sig Dispense Refill Last Dose  . gabapentin (NEURONTIN) 300 MG capsule Take 1 capsule (300 mg total) by mouth 3 (three) times daily. (Patient not taking: Reported on 01/04/2015) 90 capsule 1   . HYDROcodone-acetaminophen (NORCO) 5-325 MG per tablet Take 1-2 tablets by mouth every  4 (four) hours as needed for pain. (Patient not taking: Reported on 11/24/2014) 20 tablet 0 Completed Course at Unknown time   Home medication reconciliation was completed with the patient.   Scheduled Inpatient Medications:   . feeding supplement (RESOURCE BREEZE)  1 Container Oral TID WC  . folic acid  1 mg Oral Daily  . heparin  5,000 Units Subcutaneous 3 times per day  . multivitamin with minerals  1 tablet Oral Daily  . thiamine  100 mg Oral Daily   Or  . thiamine  100 mg Intravenous Daily    Continuous Inpatient Infusions:   . 0.9 % NaCl with KCl 20 mEq / L 100 mL/hr at 01/05/15 0542    PRN Inpatient Medications:  acetaminophen **OR** acetaminophen, LORazepam **OR** LORazepam, ondansetron **OR** ondansetron (ZOFRAN) IV  Family History: family history includes Cancer in her father; Stroke in her other; Thyroid disease in her mother. There is no history of Anesthesia problems.  The patient's family history is negative for inflammatory bowel disorders, GI malignancy, or solid organ transplantation.  Social History:   reports that she has been smoking Cigarettes.  She has been smoking about 0.25 packs per day. She has never used smokeless tobacco. She reports that she drinks about 21.6 oz of alcohol per week. She reports that she does not use illicit drugs. The patient denies ETOH, tobacco, or drug use.   Review of Systems: Constitutional: +  wt loss Eyes: No changes in vision. ENT: No oral lesions, sore throat.  GI: see HPI.  Heme/Lymph: +easy bruising CV: No chest pain.  GU: No hematuria.  Integumentary: No rashes.  Neuro: No headaches.  Psych: + depression/anxiety.  Endocrine: + heat/cold intolerance.  Allergic/Immunologic: No urticaria.  Resp: No cough, SOB.  Musculoskeletal: No joint swelling.    Physical Examination: BP 114/75 mmHg  Pulse 93  Temp(Src) 98.1 F (36.7 C) (Oral)  Resp 18  Ht 5' 6"  (1.676 m)  Wt 90 lb 4.8 oz (40.96 kg)  BMI 14.58 kg/m2  SpO2 98%   LMP 11/24/2014 Gen: NAD, alert and oriented x 4, appears malnourished.  HEENT: PEERLA, EOMI, Neck: supple, no JVD or thyromegaly Chest: CTA bilaterally, no wheezes, crackles, or other adventitious sounds CV: RRR, no m/g/c/r Abd: soft, mild RLQ TTP, , +BS in all four quadrants; no HSM, guarding, ridigity, or rebound tenderness Ext: no edema, well perfused with 2+ pulses, Skin: no rash or lesions noted Lymph: no LAD  Data: Lab Results  Component Value Date   WBC 5.5 01/04/2015   HGB 14.0 01/04/2015   HCT 40.4 01/04/2015   MCV 100.3* 01/04/2015   PLT 191 01/04/2015    Recent Labs Lab 01/04/15 1145  HGB 14.0   Lab Results  Component Value Date   NA 134* 01/04/2015   K 3.2* 01/04/2015   CL 95* 01/04/2015   CO2 26 01/04/2015   BUN 10 01/04/2015   CREATININE 0.52 01/04/2015   Lab Results  Component Value Date   ALT 145* 01/04/2015   AST 431* 01/04/2015   ALKPHOS 223* 01/04/2015   BILITOT 1.0 01/04/2015   No results for input(s): APTT, INR, PTT in the last 168 hours. Assessment/Plan: [name] is a 29 y.o. female with severe wt loss, n/v, diarrhea.  Likely also ETOH hepatitis based on labs.   Recommendations:  1.) Wt loss, diarrhea, colitis on CT.  - clear liquid diet today - colonoscopy on Thur 5/16 for colitis on CT - miralax prep  - EGD for n/v, wt loss.   2.) ETOH Hepatitis.  - check INR - ETOH cessation - will caculate discriminant func once INR resuled but unlikely to be > 32.   Thank you for the consult. Please call with questions or concerns.  REIN, Kristine Blight, MD

## 2015-01-06 ENCOUNTER — Inpatient Hospital Stay: Payer: Medicaid Other | Admitting: Anesthesiology

## 2015-01-06 ENCOUNTER — Encounter
Admission: EM | Disposition: A | Discharge status: Home Health | Payer: Self-pay | Source: Home / Self Care | Attending: Internal Medicine

## 2015-01-06 HISTORY — PX: ESOPHAGOGASTRODUODENOSCOPY: SHX5428

## 2015-01-06 HISTORY — PX: COLONOSCOPY WITH PROPOFOL: SHX5780

## 2015-01-06 LAB — BASIC METABOLIC PANEL
Anion gap: 4 — ABNORMAL LOW (ref 5–15)
BUN: <5 mg/dL — ABNORMAL LOW (ref 6–20)
CALCIUM: 7.4 mg/dL — AB (ref 8.9–10.3)
CO2: 23 mmol/L (ref 22–32)
Chloride: 109 mmol/L (ref 101–111)
Creatinine, Ser: 0.45 mg/dL (ref 0.44–1.00)
GFR calc Af Amer: >60 mL/min (ref >60)
Glucose, Bld: 95 mg/dL (ref 65–99)
Potassium: 3.8 mmol/L (ref 3.5–5.1)
Sodium: 136 mmol/L (ref 135–145)

## 2015-01-06 LAB — CBC
HCT: 29.9 % — ABNORMAL LOW (ref 35.0–47.0)
HEMOGLOBIN: 9.9 g/dL — AB (ref 12.0–16.0)
MCH: 34.2 pg — AB (ref 26.0–34.0)
MCHC: 33.2 g/dL (ref 32.0–36.0)
MCV: 103 fL — ABNORMAL HIGH (ref 80.0–100.0)
PLATELETS: 96 10*3/uL — AB (ref 150–440)
RBC: 2.9 MIL/uL — AB (ref 3.80–5.20)
RDW: 22.4 % — ABNORMAL HIGH (ref 11.5–14.5)
WBC: 5.3 10*3/uL (ref 3.6–11.0)

## 2015-01-06 LAB — PROTIME-INR
INR: 1.06
PROTHROMBIN TIME: 14 s (ref 11.4–15.0)

## 2015-01-06 LAB — SEDIMENTATION RATE: Sed Rate: 3 mm/hr (ref 0–20)

## 2015-01-06 SURGERY — EGD (ESOPHAGOGASTRODUODENOSCOPY)
Anesthesia: General

## 2015-01-06 MED ORDER — FENTANYL CITRATE (PF) 100 MCG/2ML IJ SOLN
INTRAMUSCULAR | Status: DC | PRN
  Administered 2015-01-06: 50 ug via INTRAVENOUS

## 2015-01-06 MED ORDER — LIDOCAINE HCL (CARDIAC) 20 MG/ML IV SOLN
INTRAVENOUS | Status: DC | PRN
  Administered 2015-01-06: 50 mg via INTRAVENOUS

## 2015-01-06 MED ORDER — PROPOFOL INFUSION 10 MG/ML OPTIME
INTRAVENOUS | Status: DC | PRN
  Administered 2015-01-06: 100 ug/kg/min via INTRAVENOUS

## 2015-01-06 MED ORDER — SODIUM CHLORIDE 0.9 % IV SOLN
INTRAVENOUS | Status: DC
  Administered 2015-01-06: 1000 mL via INTRAVENOUS
  Administered 2015-01-06: 09:00:00 via INTRAVENOUS

## 2015-01-06 MED ORDER — GLYCOPYRROLATE 0.2 MG/ML IJ SOLN
INTRAMUSCULAR | Status: DC | PRN
  Administered 2015-01-06: 0.2 mg via INTRAVENOUS

## 2015-01-06 MED ORDER — MIRTAZAPINE 15 MG PO TABS: 7.5000 mg | ORAL_TABLET | Freq: Every day | ORAL | Status: DC

## 2015-01-06 MED ORDER — PROPOFOL 10 MG/ML IV BOLUS
INTRAVENOUS | Status: DC | PRN
  Administered 2015-01-06: 100 mg via INTRAVENOUS

## 2015-01-06 NOTE — Interval H&P Note (Signed)
History and Physical Interval Note:  01/06/2015 9:23 AM  Kristine Macias  has presented today for surgery, with the diagnosis of n/v,  weight loss,  colitis on CT  The various methods of treatment have been discussed with the patient and family. After consideration of risks, benefits and other options for treatment, the patient has consented to  EGD and colonoscopy.  The patient's history has been reviewed, patient examined, no change in status, stable for surgery.  I have reviewed the patient's chart and labs.  Questions were answered to the patient's satisfaction.     Kristine Macias GORDON

## 2015-01-06 NOTE — Progress Notes (Signed)
[place] [facility] [place] NAME: Kristine Macias    MR#:  [id number]  DATE OF BIRTH:  1986/05/27  SUBJECTIVE:  Patient underwent EGD this afternoon the EGD was normal she is going through withdrawals and needs ativan she is tremulous. She drink over a 6 pack a day. She has had weightloss about 25 pounds in past month No m elena REVIEW OF SYSTEMS:  CONSTITUTIONAL:+ fatigue and weight loss weakness EYES: No blurred or double vision.  EARS, NOSE, AND THROAT: No tinnitus or ear pain.  RESPIRATORY: No cough, shortness of breath, wheezing or hemoptysis.  CARDIOVASCULAR: No chest pain, orthopnea, edema.  GASTROINTESTINAL: No nausea, vomiting, has diarrhea and gen  abdominal pain.  GENITOURINARY: No dysuria, hematuria.  ENDOCRINE: No polyuria, nocturia,  HEMATOLOGY: No anemia, easy bruising or bleeding SKIN: No rash or lesion. MUSCULOSKELETAL: No joint pain or arthritis.   NEUROLOGIC: No tingling, numbness, weakness.  PSYCHIATRY:+ anxiety   DRUG ALLERGIES:   Allergies  Allergen Reactions  . Sulfa Antibiotics Anaphylaxis  . Tetracyclines & Related Anaphylaxis  . Latex Hives and Itching    VITALS:  Blood pressure 118/71, pulse 69, temperature 98.4 F (36.9 C), temperature source Oral, resp. rate 17, height 5' 6"  (1.676 m), weight 40.96 kg (90 lb 4.8 oz), last menstrual period 11/24/2014, SpO2 100 %.  PHYSICAL EXAMINATION:  GENERAL:  29 y.o.-year-old patient lying in the bed with no acute distress.very frail appearing and thin EYES: Pupils equal, round, reactive to light and accommodation. No scleral icterus. Extraocular muscles intact.  HEENT: Head atraumatic, normocephalic. Oropharynx and nasopharynx clear.  NECK:  Supple, no jugular venous distention. No thyroid enlargement, no tenderness.  LUNGS: Normal breath sounds bilaterally, no wheezing, rales,rhonchi or crepitation. No use of accessory muscles of respiration.  CARDIOVASCULAR: S1,  S2 normal. No murmurs, rubs, or gallops.  ABDOMEN: Soft, gen tender,no rebound , nondistended. Bowel sounds present.  EXTREMITIES: No pedal edema, cyanosis, or clubbing.  NEUROLOGIC: Cranial nerves II through XII are intact. + tremos  PSYCHIATRIC: The patient is alert and oriented x 3.  She has flat affect SKIN: No obvious rash, lesion, or ulcer.    LABORATORY PANEL:   CBC  Recent Labs Lab 01/06/15 0613  WBC 5.3  HGB 9.9*  HCT 29.9*  PLT 96*   ------------------------------------------------------------------------------------------------------------------  Chemistries   Recent Labs Lab 01/04/15 1145 01/06/15 0613  NA 134* 136  K 3.2* 3.8  CL 95* 109  CO2 26 23  GLUCOSE 117* 95  BUN 10 <5*  CREATININE 0.52 0.45  CALCIUM 8.1* 7.4*  MG 1.7  --   AST 431*  --   ALT 145*  --   ALKPHOS 223*  --   BILITOT 1.0  --    ------------------------------------------------------------------------------------------------------------------  Cardiac Enzymes No results for input(s): TROPONINI in the last 168 hours. ------------------------------------------------------------------------------------------------------------------  RADIOLOGY:  Ct Abdomen Pelvis W Contrast  01/04/2015   .  IMPRESSION: Abnormal appearance of the colon most notable involving the transverse colon, ascending colon and rectosigmoid junction. Colitis may be explained by inflammatory or infectious process. The appendix does not appear inflamed. Small bowel without discrete abnormality.  Enlarged diffuse fatty infiltration of the liver. This distorts and displaces surrounding structures.  Enlarged gallbladder without calcified gallstones.  Enlarged common bile duct measuring 8 mm versus prior 7 mm. Cause indeterminate. Discrete pancreatic head mass or calcified common bile duct stone not identified. Pancreatic duct top-normal in size.   Electronically Signed   By: Remo Lipps  Jeannine Kitten M.D.   On: 01/04/2015 17:11     EKG:   Orders placed or performed during the hospital encounter of 06/13/12  . ED EKG  . ED EKG  . EKG 12-Lead  . EKG 12-Lead  . EKG    ASSESSMENT AND PLAN:   29 year old femlae with past medical history of generalized anxiety disorder, severe alcohol dependence history of alcohol-induced hepatitis comes into the emergency room with   1 generalized abdominal pain with nausea and vomiting and significant weight loss of 30 pounds in 1-1/2 months.  S/p EGD which was normal HIV and TSH normal Psych is also following Patient will require outpatient workup for her weight loss. biopsies were taken during EGD which should be followed up on. She also has c/s which showed possible ulcer distal rectum. Biopsies were taken  2 abnormal CT scan showing pancolitis and enlarged CBD: patient underwent c/s further workup per GI.   3 alcohol dependence with ongoing alcohol abuse. Continue CIWA and apprecaite PSYCH consult   4 generalized anxiety : seen and evaluated by Dr Weber Cooks who recommends not starting Xanax as this is not appropriate medication for a patient with alcohol abuse and does not treat generalized anxiety disorder. He does not recommend him restart benzodiazepines.   #5 DVT prophylaxis - pt is ambulatory ,  D/c subcutaneous heparin , she is refusing       Management plans discussed with the patient and she is in agreement CODE STATUS: full  TOTAL TIME TAKING CARE OF THIS PATIENT: 22 minutes.   POSSIBLE D/C IN 1-2  DAYS, DEPENDING ON CLINICAL CONDITION.   Ion Gonnella M.D on 01/06/2015 at 4:42 PM  Between 7am to 6pm - Pager - [phone] After 6pm go to [email or web address] - password [email or web address] [facility]  Office  [phone]  CC: Primary care physician; PROVIDER NOT IN SYSTEM

## 2015-01-06 NOTE — Consult Note (Signed)
  Psychiatry: Follow-up for this 29 year old woman on the medical service for nausea and lack of appetite. Information from the patient from the chart and from conversation with the gastroenterologist who did her endoscope today. Gastroenterology is very concerned about the potential for a fatal outcome from her starvation. Points out that her BMI is around 14. They did not find apparently much more than mild gastritis nothing severe to explain her symptoms. They offered her a PEG tube and the patient has declined.  On conversation today the patient gave me essentially the same history as yesterday. Her appetite had gradually faded out. It is not that she is trying to starve herself. She says she has no wish to die. No thought about killing herself. She repeatedly claims that she is not depressed although I note that she gets tearful very easily. She endorses a lot of of anxiety symptoms and eventually made it clear to me that she really only wanted to get some Xanax prescribed because she had had good response to it in the past.  On review of systems patient continues to have mild nausea but is not throwing up. Has been able to eat a little bit today. Denies being depressed. Endorses chronic anxiety. Denies suicidal ideation denies psychotic symptoms.  On mental status this is a slightly disheveled very thin-looking woman who looks her stated age. Eye contact intermittent psychomotor activity slowed speech very quiet. Affect dysphoric becomes more tearful as we talk. Denies any suicidal thoughts or wish to die. Denies any hallucinations. She is alert and oriented 4. Judgment appears to be reasonably intact she understands the severity of her situation and is able to rationally discuss the pros and cons of treatment.  I suggested to the patient that I think her alcohol abuse is causing more of her gastrointestinal problems and she gives a credit for. She is willing to believe this but continues to see the  alcohol as a way to "treat" her anxiety. Psychoeducation done about how alcohol actually worsens anxiety problems. I explained to her why I would not be in favor of prescribing benzodiazepines, specifically the high risk of addiction, below chance that is really going to treat her problem and the risk of overdose and continued alcohol abuse.  Reviewed with her the gastroenterology findings to the extent that I could. Made it very clear to her the concern about her possibly dying from continued starvation.  Patient was opposed to taking a serotonin reuptake inhibitor because of her past experience of it causing anxiety. I suggested to her that we try mirtazapine instead of atypical serotonin reuptake inhibitor as it may be much better tolerated. This can also help with her appetite and sleep problems. She was actually agreeable to that. I am going to start her on 7-1/2 mg of mirtazapine at night. I emphasized to her that this needs to be followed up as an outpatient and gradually increased to a hopefully target dose of about 30 mg or more. I strongly encouraged her to get involved with outpatient substance abuse treatment and stop her drinking. Patient does not meet commitment criteria would not benefit from inpatient psychiatric hospitalization.  At this point I don't think that we can clearly say that she has typical anorexia nervosa or bulimia and probably would not fit in well with a eating disorders program although she does need some refeeding. We'll follow as needed. Mirtazapine initiated. Diagnosis generalized anxiety disorder depression NOS alcohol abuse with dependence

## 2015-01-06 NOTE — Anesthesia Postprocedure Evaluation (Signed)
  Anesthesia Post-op Note  Patient: Kristine Macias  Procedure(s) Performed: Procedure(s): ESOPHAGOGASTRODUODENOSCOPY (EGD) (N/A) COLONOSCOPY WITH PROPOFOL (N/A)  Anesthesia type:General  Patient location: PACU  Post pain: Pain level controlled  Post assessment: Post-op Vital signs reviewed, Patient's Cardiovascular Status Stable, Respiratory Function Stable, Patent Airway and No signs of Nausea or vomiting  Post vital signs: Reviewed and stable  Last Vitals:  Filed Vitals:   01/06/15 1116  BP: 105/62  Pulse: 68  Temp: 36.8 C  Resp:     Level of consciousness: awake, alert  and patient cooperative  Complications: No apparent anesthesia complications

## 2015-01-06 NOTE — Anesthesia Preprocedure Evaluation (Signed)
Anesthesia Evaluation  Patient identified by MRN, date of birth, ID band Patient awake    Reviewed: Allergy & Precautions, H&P , NPO status , Patient's Chart, lab work & pertinent test results, reviewed documented beta blocker date and time   Airway Mallampati: II  TM Distance: >3 FB Neck ROM: full    Dental no notable dental hx.    Pulmonary neg pulmonary ROS, Current Smoker,  breath sounds clear to auscultation  Pulmonary exam normal       Cardiovascular Exercise Tolerance: Good negative cardio ROS  Rhythm:regular Rate:Normal     Neuro/Psych Anxiety negative neurological ROS  negative psych ROS   GI/Hepatic negative GI ROS, Neg liver ROS, PUD,   Endo/Other  negative endocrine ROS  Renal/GU Renal diseasenegative Renal ROS  negative genitourinary   Musculoskeletal   Abdominal   Peds  Hematology negative hematology ROS (+)   Anesthesia Other Findings   Reproductive/Obstetrics negative OB ROS                             Anesthesia Physical Anesthesia Plan  ASA: III  Anesthesia Plan: General   Post-op Pain Management:    Induction:   Airway Management Planned:   Additional Equipment:   Intra-op Plan:   Post-operative Plan:   Informed Consent: I have reviewed the patients History and Physical, chart, labs and discussed the procedure including the risks, benefits and alternatives for the proposed anesthesia with the patient or authorized representative who has indicated his/her understanding and acceptance.   Dental Advisory Given  Plan Discussed with: CRNA  Anesthesia Plan Comments:         Anesthesia Quick Evaluation

## 2015-01-06 NOTE — Op Note (Signed)
[facility] Gastroenterology Patient Name: Kristine Macias Procedure Date: 01/06/2015 9:10 AM MRN: [id number] Account #: [id number] Date of Birth: 1986/08/20 Admit Type: Inpatient Age: 29 Room: [facility] ENDO ROOM 1 Gender: Female Note Status: Finalized Procedure:         Upper GI endoscopy Indications:       Nausea with vomiting, Weight loss Patient Profile:   This is a 29 year old female. Providers:         Gerrit Heck. Rayann Heman, MD Medicines:         Propofol per Anesthesia Complications:     No immediate complications. Procedure:         Pre-Anesthesia Assessment:                    - Prior to the procedure, a History and Physical was                     performed, and patient medications and allergies were                     reviewed. The patient is competent. The risks and benefits                     of the procedure and the sedation options and risks were                     discussed with the patient. All questions were answered                     and informed consent was obtained. Patient identification                     and proposed procedure were verified by the physician and                     the nurse in the pre-procedure area. Mental Status                     Examination: alert and oriented. Airway Examination:                     normal oropharyngeal airway and neck mobility. Respiratory                     Examination: clear to auscultation. CV Examination: RRR,                     no murmurs, no S3 or S4. Prophylactic Antibiotics: The                     patient does not require prophylactic antibiotics. Prior                     Anticoagulants: The patient has taken no previous                     anticoagulant or antiplatelet agents. ASA Grade                     Assessment: III - A patient with severe systemic disease.                     After reviewing the risks and benefits, the patient was  deemed in satisfactory condition  to undergo the procedure.                     The anesthesia plan was to use monitored anesthesia care                     (MAC). Immediately prior to administration of medications,                     the patient was re-assessed for adequacy to receive                     sedatives. The heart rate, respiratory rate, oxygen                     saturations, blood pressure, adequacy of pulmonary                     ventilation, and response to care were monitored                     throughout the procedure. The physical status of the                     patient was re-assessed after the procedure.                    - Prior to the procedure, a History and Physical was                     performed, and patient medications, allergies and                     sensitivities were reviewed. The patient's tolerance of                     previous anesthesia was reviewed.                    After obtaining informed consent, the endoscope was passed                     under direct vision. Throughout the procedure, the                     patient's blood pressure, pulse, and oxygen saturations                     were monitored continuously. The Olympus GIF-160 endoscope                     (S#. [id number]) was introduced through the mouth, and                     advanced to the second part of duodenum. The upper GI                     endoscopy was accomplished without difficulty. The patient                     tolerated the procedure well. Findings:      The esophagus was normal.      The stomach was normal.      The examined duodenum was normal.      Five biopsies were obtained with cold forceps for histology randomly in  the duodenal bulb and in the 2nd part of the duodenum. Impression:        - Normal esophagus.                    - Normal stomach.                    - Normal examined duodenum.                    - Five biopsies were obtained in the duodenal bulb and in                      the 2nd part of the duodenum. Recommendation:    - Perform a colonoscopy today.                    - Await pathology results.                    - The findings and recommendations were discussed with the                     patient. Procedure Code(s): --- Professional ---                    [phone], Esophagogastroduodenoscopy, flexible, transoral;                     with biopsy, single or multiple CPT copyright 2014 American Medical Association. All rights reserved. The codes documented in this report are preliminary and upon coder review may  be revised to meet current compliance requirements. Mellody Life, MD 01/06/2015 9:39:05 AM This report has been signed electronically. Number of Addenda: 0 Note Initiated On: 01/06/2015 9:10 AM      [facility]

## 2015-01-06 NOTE — Plan of Care (Signed)
Problem: Discharge Progression Outcomes Goal: Discharge plan in place and appropriate Individualization: 1. Lives at home with mother and her 2 children. 2. Admits to ETOH abuse and verbalizes understanding she is in withdrawal. 3. Reports has lost 13 lbs in past month;only able to tolerate full liquid diet. 4. HIGH risk for fall- reports multiple falls at home-bed alarm activated with education done.     Goal: Other Discharge Outcomes/Goals Outcome: Progressing Plan of care progress to goal for: Nausea and Vomiting - Continues on CIWA, ativan given x1 per CIWA scale. - Assist to BR. - Continues on IV fluids. - Scheduled for an EGD this am, NPO except meds maintained.

## 2015-01-06 NOTE — Plan of Care (Signed)
Problem: Spiritual Needs Goal: Ability to function at adequate level Plan of care progress to goal: Pt had EGD & colonoscopy today. No c/o pain.  On CIWA-AR, no ativan needed at this time.  Diet advanced to regular. IVF discontinued.

## 2015-01-06 NOTE — Op Note (Signed)
[facility]astroenterology Patient Name: Kristine Macias Procedure Date: 01/06/2015 9:09 AM MRN: [id number] Account #: [id number] Date of Birth: 1985-10-01 Admit Type: Inpatient Age: 29 Room: [facility] ENDO ROOM 1 Gender: Female Note Status: Finalized Procedure:         Colonoscopy Indications:       Chronic diarrhea, Abnormal CT of the GI tract, Weight loss Patient Profile:   This is a 30 year old female. Providers:         Gerrit Heck. Rayann Heman, MD Medicines:         Propofol per Anesthesia Complications:     No immediate complications. Procedure:         Pre-Anesthesia Assessment:                    - See the other procedure note for documentation of the                     pre-procedure assessment.                    After obtaining informed consent, the colonoscope was                     passed under direct vision. Throughout the procedure, the                     patient's blood pressure, pulse, and oxygen saturations                     were monitored continuously. The Olympus PCF-160AL                     colonoscope (S#. [id number]) was introduced through the anus                     and advanced to the the terminal ileum. The colonoscopy                     was performed without difficulty. The patient tolerated                     the procedure well. The quality of the bowel preparation                     was fair. Findings:      The perianal and digital rectal examinations were normal.      A localized area of mucosa in the terminal ileum was mildly       erythematous. This was biopsied with a cold forceps for histology.      A localized area of moderately erythematous and granular mucosa with a       possible single ulceration was found in the distal rectum. Biopsies were       taken with a cold forceps for histology.      Biopsies were taken with a cold forceps in the proximal transverse colon       and in the ascending colon for histology.      Biopsies  were taken with a cold forceps in the rectum and in the sigmoid       colon for histology.      The exam was otherwise without abnormality on direct and retroflexion       views. Impression:        - Mildly erythematous mucosa in the  terminal ileum.                     Biopsied.                    - Erythematous and granular mucosa in the distal rectum                     with possible ulcer. Biopsied.                    - The examination was otherwise normal on direct and                     retroflexion views.                    - Biopsies were taken with a cold forceps for histology in                     the proximal transverse colon and in the ascending colon.                    - Biopsies were taken with a cold forceps for histology in                     the rectum and in the sigmoid colon. Recommendation:    - Observe patient in GI recovery unit.                    - Resume regular diet.                    - Continue present medications.                    - Await pathology results.                    - The findings and recommendations were discussed with the                     patient.                    - Need to consider PEG tube for severe malnutrition ( BMI                     14), pending psych input. Procedure Code(s): --- Professional ---                    [phone], Colonoscopy, flexible; with biopsy, single or                     multiple CPT copyright 2014 American Medical Association. All rights reserved. The codes documented in this report are preliminary and upon coder review may  be revised to meet current compliance requirements. Mellody Life, MD 01/06/2015 10:11:11 AM This report has been signed electronically. Number of Addenda: 0 Note Initiated On: 01/06/2015 9:09 AM Scope Withdrawal Time: 0 hours 15 minutes 56 seconds  Total Procedure Duration: 0 hours 19 minutes 52 seconds       [facility]

## 2015-01-06 NOTE — H&P (View-Only) (Signed)
GI Inpatient Consult Note  Reason for Consult:  Pan-colitis on CT, diarrhea, malnutrition.    Attending Requesting Consult: Fritzi Mandes MD  History of Present Illness: Kristine Macias is a 29 y.o. female with PMHx anxiety, ETOH abuse presenting for severe weight loss, n/v, diarrhea.  Reports at least 30 lb wt loss since 10/2014.  Had miscarriage at that time and unable to eat with n/v, poor appetite since then.  Also has loose watery stools whenever she eats. If doesn't eat, has one stool per day.  No blood in stools but are on dark side.  No f/c.  No abd pain.   Does have elev liver enzymes with AST 3 x ALT. Does endorese heavy ETOH use and wants to quit.   No prev EGD or colonoscopy.l   Past Medical History:  Past Medical History  Diagnosis Date  . Anxiety   . Chlamydia 2007  . Irregular heart beat 2010    wt/diet related after evaluation  . Renal disorder   . Pneumothorax, spontaneous, tension   . Alcohol abuse     Problem List: Patient Active Problem List   Diagnosis Date Noted  . Protein-calorie malnutrition, severe 01/05/2015  . Pancolitis 01/04/2015  . Generalized anxiety disorder 11/26/2014    Class: Chronic  . Alcohol use disorder, severe, dependence 11/25/2014  . Pelvic pain in pregnancy 04/10/2012  . Vaginitis 04/10/2012  . Adnexal mass 04/10/2012    Past Surgical History: Past Surgical History  Procedure Laterality Date  . Cesarean section    . Chest tube insertion      Allergies: Allergies  Allergen Reactions  . Sulfa Antibiotics Anaphylaxis  . Tetracyclines & Related Anaphylaxis  . Latex Hives and Itching    Home Medications: Prescriptions prior to admission  Medication Sig Dispense Refill Last Dose  . gabapentin (NEURONTIN) 300 MG capsule Take 1 capsule (300 mg total) by mouth 3 (three) times daily. (Patient not taking: Reported on 01/04/2015) 90 capsule 1   . HYDROcodone-acetaminophen (NORCO) 5-325 MG per tablet Take 1-2 tablets by mouth every  4 (four) hours as needed for pain. (Patient not taking: Reported on 11/24/2014) 20 tablet 0 Completed Course at Unknown time   Home medication reconciliation was completed with the patient.   Scheduled Inpatient Medications:   . feeding supplement (RESOURCE BREEZE)  1 Container Oral TID WC  . folic acid  1 mg Oral Daily  . heparin  5,000 Units Subcutaneous 3 times per day  . multivitamin with minerals  1 tablet Oral Daily  . thiamine  100 mg Oral Daily   Or  . thiamine  100 mg Intravenous Daily    Continuous Inpatient Infusions:   . 0.9 % NaCl with KCl 20 mEq / L 100 mL/hr at 01/05/15 0542    PRN Inpatient Medications:  acetaminophen **OR** acetaminophen, LORazepam **OR** LORazepam, ondansetron **OR** ondansetron (ZOFRAN) IV  Family History: family history includes Cancer in her father; Stroke in her other; Thyroid disease in her mother. There is no history of Anesthesia problems.  The patient's family history is negative for inflammatory bowel disorders, GI malignancy, or solid organ transplantation.  Social History:   reports that she has been smoking Cigarettes.  She has been smoking about 0.25 packs per day. She has never used smokeless tobacco. She reports that she drinks about 21.6 oz of alcohol per week. She reports that she does not use illicit drugs. The patient denies ETOH, tobacco, or drug use.   Review of Systems: Constitutional: +  wt loss Eyes: No changes in vision. ENT: No oral lesions, sore throat.  GI: see HPI.  Heme/Lymph: +easy bruising CV: No chest pain.  GU: No hematuria.  Integumentary: No rashes.  Neuro: No headaches.  Psych: + depression/anxiety.  Endocrine: + heat/cold intolerance.  Allergic/Immunologic: No urticaria.  Resp: No cough, SOB.  Musculoskeletal: No joint swelling.    Physical Examination: BP 114/75 mmHg  Pulse 93  Temp(Src) 98.1 F (36.7 C) (Oral)  Resp 18  Ht 5' 6"  (1.676 m)  Wt 90 lb 4.8 oz (40.96 kg)  BMI 14.58 kg/m2  SpO2 98%   LMP 11/24/2014 Gen: NAD, alert and oriented x 4, appears malnourished.  HEENT: PEERLA, EOMI, Neck: supple, no JVD or thyromegaly Chest: CTA bilaterally, no wheezes, crackles, or other adventitious sounds CV: RRR, no m/g/c/r Abd: soft, mild RLQ TTP, , +BS in all four quadrants; no HSM, guarding, ridigity, or rebound tenderness Ext: no edema, well perfused with 2+ pulses, Skin: no rash or lesions noted Lymph: no LAD  Data: Lab Results  Component Value Date   WBC 5.5 01/04/2015   HGB 14.0 01/04/2015   HCT 40.4 01/04/2015   MCV 100.3* 01/04/2015   PLT 191 01/04/2015    Recent Labs Lab 01/04/15 1145  HGB 14.0   Lab Results  Component Value Date   NA 134* 01/04/2015   K 3.2* 01/04/2015   CL 95* 01/04/2015   CO2 26 01/04/2015   BUN 10 01/04/2015   CREATININE 0.52 01/04/2015   Lab Results  Component Value Date   ALT 145* 01/04/2015   AST 431* 01/04/2015   ALKPHOS 223* 01/04/2015   BILITOT 1.0 01/04/2015   No results for input(s): APTT, INR, PTT in the last 168 hours. Assessment/Plan: [name] is a 29 y.o. female with severe wt loss, n/v, diarrhea.  Likely also ETOH hepatitis based on labs.   Recommendations:  1.) Wt loss, diarrhea, colitis on CT.  - clear liquid diet today - colonoscopy on Thur 5/16 for colitis on CT - miralax prep  - EGD for n/v, wt loss.   2.) ETOH Hepatitis.  - check INR - ETOH cessation - will caculate discriminant func once INR resuled but unlikely to be > 32.   Thank you for the consult. Please call with questions or concerns.  REIN, Grace Blight, MD

## 2015-01-06 NOTE — Progress Notes (Signed)
GI Inpatient Follow-up Note  Patient Identification: Kristine Macias is a 29 y.o. female with severe malnutrition, 30 lb wt loss.   Subjective:  EGD and colon today Mild inflammation on colonoscopy, likely non-specific.  EGD normal.   Little change.  No n/v, but does not want to eat.  No f/c, c/p, SOB>   Scheduled Inpatient Medications:  . feeding supplement (RESOURCE BREEZE)  1 Container Oral TID WC  . folic acid  1 mg Oral Daily  . heparin  5,000 Units Subcutaneous 3 times per day  . multivitamin with minerals  1 tablet Oral Daily  . thiamine  100 mg Oral Daily   Or  . thiamine  100 mg Intravenous Daily  . venlafaxine XR  37.5 mg Oral Q breakfast    Continuous Inpatient Infusions:     PRN Inpatient Medications:  acetaminophen **OR** acetaminophen, LORazepam **OR** LORazepam, ondansetron **OR** ondansetron (ZOFRAN) IV    Physical Examination: BP 118/71 mmHg  Pulse 69  Temp(Src) 98.4 F (36.9 C) (Oral)  Resp 17  Ht 5' 6"  (1.676 m)  Wt 90 lb 4.8 oz (40.96 kg)  BMI 14.58 kg/m2  SpO2 100%  LMP 11/24/2014 Gen: NAD, alert and oriented x 4, appears severely malnourished.  HEENT: PEERLA, EOMI, Neck: supple, no JVD or thyromegaly Chest: CTA bilaterally, no wheezes, crackles, or other adventitious sounds CV: RRR, no m/g/c/r Abd: soft, NT, ND, +BS in all four quadrants; no HSM, guarding, ridigity, or rebound tenderness Ext: no edema, well perfused with 2+ pulses, Skin: no rash or lesions noted Lymph: no LAD  Data: Lab Results  Component Value Date   WBC 5.3 01/06/2015   HGB 9.9* 01/06/2015   HCT 29.9* 01/06/2015   MCV 103.0* 01/06/2015   PLT 96* 01/06/2015    Recent Labs Lab 01/04/15 1145 01/06/15 0613  HGB 14.0 9.9*   Lab Results  Component Value Date   NA 136 01/06/2015   K 3.8 01/06/2015   CL 109 01/06/2015   CO2 23 01/06/2015   BUN <5* 01/06/2015   CREATININE 0.45 01/06/2015   Lab Results  Component Value Date   ALT 145* 01/04/2015   AST  431* 01/04/2015   ALKPHOS 223* 01/04/2015   BILITOT 1.0 01/04/2015    Recent Labs Lab 01/06/15 1527  INR 1.06   Assessment/Plan: Kristine Macias is a 29 y.o. female with severe malnutrition, severe wt loss.   EGD normal, no etiology on CT.  Unlikely to find organic cause of the wt loss and malnutrition.  ? related to miscarriage and mother's death.  ? Eating disorder.  She says her severe anxiety causes her not to be able to eat and she is self medicating with alcohol.  Discussed placement of PEG tube to avoid further complication and death since no obvious correctable etiology at this time other than psychogenic causes.  She needs time to consider PEG.   Recommendations: - calorie counts - possible PEG early next week.  - f/u path from colon.   Please call with questions or concerns.  Kristine Macias, Kristine Blight, MD

## 2015-01-06 NOTE — Consult Note (Signed)
  Psychiatry: Follow-up for this patient with generalized anxiety and alcohol abuse. As far as the alcohol withdrawal her pulse and blood pressure are most recently within normal limits. Patient is not delirious. Shows no real signs of active alcohol withdrawal. As far as her anxiety disorder she inform nursing today that she would refuse Effexor. I had educated her that simply because it had not been effective in the past when she was an adolescent would not rule out its being effective as an adult. Both nursing and myself get the impression that the patient primarily wants to be restarted on Xanax.  History of alcohol abuse strongly rules out the appropriateness of the use of Xanax. Additionally Xanax is simply not an appropriate medication as a first story even second line for treating generalized anxiety disorder. Will not restart benzodiazepines. We'll follow as needed

## 2015-01-06 NOTE — Transfer of Care (Signed)
Immediate Anesthesia Transfer of Care Note  Patient: Kristine Macias  Procedure(s) Performed: Procedure(s): ESOPHAGOGASTRODUODENOSCOPY (EGD) (N/A) COLONOSCOPY WITH PROPOFOL (N/A)  Patient Location: PACU and Endoscopy Unit  Anesthesia Type:general  Level of Consciousness: awake, alert  and oriented  Airway & Oxygen Therapy: Patient Spontanous Breathing and Patient connected to nasal cannula oxygen  Post-op Assessment: Report given to RN and Post -op Vital signs reviewed and stable  Post vital signs: Reviewed and stable  Last Vitals:  Filed Vitals:   01/06/15 1014  BP: 114/82  Pulse: 95  Temp: 35.7 C  Resp: 14    Complications: No apparent anesthesia complications

## 2015-01-07 ENCOUNTER — Encounter: Payer: Self-pay | Admitting: Gastroenterology

## 2015-01-07 MED ORDER — MIRTAZAPINE 15 MG PO TABS
15.0000 mg | ORAL_TABLET | Freq: Every day | ORAL | Status: DC
  Filled 2015-01-07 (×2): qty 1

## 2015-01-07 NOTE — Progress Notes (Signed)
Psychiatry: Follow-up for this 29 year old woman with anxiety disorder and poor by mouth intake and alcohol abuse. Patient reports that her nerves are still bad. She has been able to eat a little bit however and has not been throwing up. Apparently the plan is now for her to get a feeding tube placed. She seems to be agreeable to this.  Patient is awake alert and oriented. No new specific complaints. Denies suicidality.  On mental status this is still a disheveled woman appears cachectic but she is wide awake alert well oriented not at all delirious. Denies suicidal ideation and denies hallucinations.  Tolerated the 7-1/2 mg of mirtazapine last night. I'm going to increase the dose of that 15 mg tonight.  Gradually over the next couple weeks I would still recommend trying to gradually go up on that. If she has any trouble with swallowing at as a pill mirtazapine comes as a dissolvable form as well. I will follow up after the weekend if she is still in the hospital. Supportive counseling and review of treatment plan completed with patient. No change to diagnosis

## 2015-01-07 NOTE — Plan of Care (Signed)
Problem: Discharge Progression Outcomes Goal: Discharge plan in place and appropriate Individualization: 1. Lives at home with mother and her 2 children. 2. Admits to ETOH abuse and verbalizes understanding she is in withdrawal. 3. Reports has lost 13 lbs in past month;only able to tolerate full liquid diet. 4. HIGH risk for fall- reports multiple falls at home-bed alarm activated with education done.      Goal: Other Discharge Outcomes/Goals Outcome: Progressing Plan of care progress to goal for: Nausea and Vomiting - Continues on CIWA, ativan given x1 per CIWA scale. - Assist to BR. Will continue to monitor.

## 2015-01-07 NOTE — Progress Notes (Addendum)
Nutrition Follow-up  DOCUMENTATION CODES:  Severe malnutrition in context of acute illness/injury  INTERVENTION: Meals and Snacks: Cater to patient preferences Medical Food Supplement Therapy: will continue [employer] as pt reports liking and drinking. Education: RD provided nutrition education and counseling on ways to increase kcals and protein.  Written materials provided.  Pt reports trying to increase intake at each meal. Discussed avoiding foods with 'empty nutrition' such as potato chips. Also recommended supplementation drinks such as [employer] and/or boost.  Adherence is likely.  NUTRITION DIAGNOSIS:  Inadequate oral intake related to acute illness, inability to eat as evidenced by energy intake < or equal to 50% for > or equal to 1 month, severe depletion of body fat, severe depletion of muscle mass.  GOAL:  Patient will meet greater than or equal to 90% of their needs, Weight gain  MONITOR:  Digestive System Anthropometrics Electrolyte and renal Profile Hepatic Profile  REASON FOR ASSESSMENT:  Consult Assessment of nutrition requirement/status  ASSESSMENT:  Per MD note, EGD negative.   PO Intake: pt with [place] sandwich untouched in room on visit for lunch. Pt reports eating 50% of bagel this am.  Pt drinking applejuice. Pt reports eating a whole pizza yesterday.  Medications: Remeron added Labs: Electrolyte and Renal Profile:    Recent Labs Lab 01/04/15 1145 01/06/15 0613  BUN 10 <5*  CREATININE 0.52 0.45  NA 134* 136  K 3.2* 3.8  MG 1.7  --   PHOS 4.3  --    Glucose Profile: No results for input(s): GLUCAP in the last 72 hours. Protein Profile:  Recent Labs Lab 01/04/15 1145  ALBUMIN 3.0*    Height:  Ht Readings from Last 1 Encounters:  01/04/15 5' 6"  (1.676 m)    Weight:  Wt Readings from Last 1 Encounters:  01/04/15 90 lb 4.8 oz (40.96 kg)    Ideal Body Weight:  59 kg  Wt Readings from Last 10  Encounters:  01/04/15 90 lb 4.8 oz (40.96 kg)  04/24/12 99 lb 3.2 oz (44.997 kg)  04/10/12 99 lb 4.8 oz (45.042 kg)  08/31/11 94 lb 6.4 oz (42.82 kg)    BMI:  Body mass index is 14.58 kg/(m^2).  Estimated Nutritional Needs:  Kcal:  1917-2237kcals, BEE: 1332kcals, (IF 1.2-1.4)(AF 1.2) using IBW of 59kg  Protein:  59-71g protein (1.0-1.2g/kg) using IBW of 59kg  Fluid:  1475-1780m of fluid (25-328mkg) using IBW of 59kg  Skin:  Reviewed, no issues  Diet Order:  Diet regular Room service appropriate?: Yes; Fluid consistency:: Thin  EDUCATION NEEDS:  No education needs identified at this time   Intake/Output Summary (Last 24 hours) at 01/07/15 1600 Last data filed at 01/06/15 2300  Gross per 24 hour  Intake      0 ml  Output    350 ml  Net   -350 ml    Last BM:  5/13     MODERATE Care Level  AlDwyane LuoRD, LDN Pager [phone]

## 2015-01-07 NOTE — Plan of Care (Signed)
Problem: Discharge Progression Outcomes Goal: Discharge plan in place and appropriate Individualization of Care  1. Lives at home with mother and her 2 children. 2. Admits to ETOH abuse and verbalizes understanding she is in withdrawal. 3. Reports has lost 13 lbs in past month;only able to tolerate full liquid diet.    Goal: Other Discharge Outcomes/Goals Plan of Care Progressing to Goal: No complaints of pain. Ativan given x 2 with noted effectiveness

## 2015-01-07 NOTE — Progress Notes (Signed)
[place] [facility] [place] NAME: Kristine Macias    MR#:  [id number]  DATE OF BIRTH:  March 04, 1986  SUBJECTIVE:  Patient underwent EGD yesterday EGD was normal she is going through withdrawals and needs ativan she is tremulous. She drink over a 6 pack a day. She has had weightloss about 25 pounds in past month. She thinks she is doing good with short  Acting Benzos, wants PEG tube placement No m elena REVIEW OF SYSTEMS:  CONSTITUTIONAL:+ fatigue and weight loss weakness EYES: No blurred or double vision.  EARS, NOSE, AND THROAT: No tinnitus or ear pain.  RESPIRATORY: No cough, shortness of breath, wheezing or hemoptysis.  CARDIOVASCULAR: No chest pain, orthopnea, edema.  GASTROINTESTINAL: No nausea, vomiting, has diarrhea and gen  abdominal pain.  GENITOURINARY: No dysuria, hematuria.  ENDOCRINE: No polyuria, nocturia,  HEMATOLOGY: No anemia, easy bruising or bleeding SKIN: No rash or lesion. MUSCULOSKELETAL: No joint pain or arthritis.   NEUROLOGIC: No tingling, numbness, weakness.  PSYCHIATRY:+ anxiety   DRUG ALLERGIES:   Allergies  Allergen Reactions  . Sulfa Antibiotics Anaphylaxis  . Tetracyclines & Related Anaphylaxis  . Latex Hives and Itching    VITALS:  Blood pressure 112/61, pulse 82, temperature 98.1 F (36.7 C), temperature source Oral, resp. rate 18, height 5' 6"  (1.676 m), weight 40.96 kg (90 lb 4.8 oz), last menstrual period 11/24/2014, SpO2 100 %.  PHYSICAL EXAMINATION:  GENERAL:  29 y.o.-year-old patient lying in the bed with no acute distress.very frail appearing and thin EYES: Pupils equal, round, reactive to light and accommodation. No scleral icterus. Extraocular muscles intact.  HEENT: Head atraumatic, normocephalic. Oropharynx and nasopharynx clear.  NECK:  Supple, no jugular venous distention. No thyroid enlargement, no tenderness.  LUNGS: Normal breath sounds bilaterally, no wheezing, rales,rhonchi or  crepitation. No use of accessory muscles of respiration.  CARDIOVASCULAR: S1, S2 normal. No murmurs, rubs, or gallops.  ABDOMEN: Soft, gen tender,no rebound , nondistended. Bowel sounds present.  EXTREMITIES: No pedal edema, cyanosis, or clubbing.  NEUROLOGIC: Cranial nerves II through XII are intact. + tremos  PSYCHIATRIC: The patient is alert and oriented x 3.  She has flat affect SKIN: No obvious rash, lesion, or ulcer.    LABORATORY PANEL:   CBC  Recent Labs Lab 01/06/15 0613  WBC 5.3  HGB 9.9*  HCT 29.9*  PLT 96*   ------------------------------------------------------------------------------------------------------------------  Chemistries   Recent Labs Lab 01/04/15 1145 01/06/15 0613  NA 134* 136  K 3.2* 3.8  CL 95* 109  CO2 26 23  GLUCOSE 117* 95  BUN 10 <5*  CREATININE 0.52 0.45  CALCIUM 8.1* 7.4*  MG 1.7  --   AST 431*  --   ALT 145*  --   ALKPHOS 223*  --   BILITOT 1.0  --    ------------------------------------------------------------------------------------------------------------------  Cardiac Enzymes No results for input(s): TROPONINI in the last 168 hours. ------------------------------------------------------------------------------------------------------------------  RADIOLOGY:  Ct Abdomen Pelvis W Contrast  01/04/2015   .  IMPRESSION: Abnormal appearance of the colon most notable involving the transverse colon, ascending colon and rectosigmoid junction. Colitis may be explained by inflammatory or infectious process. The appendix does not appear inflamed. Small bowel without discrete abnormality.  Enlarged diffuse fatty infiltration of the liver. This distorts and displaces surrounding structures.  Enlarged gallbladder without calcified gallstones.  Enlarged common bile duct measuring 8 mm versus prior 7 mm. Cause indeterminate. Discrete pancreatic head mass or calcified common bile duct stone not identified.  Pancreatic duct top-normal in size.    Electronically Signed   By: Genia Del M.D.   On: 01/04/2015 17:11    EKG:   Orders placed or performed during the hospital encounter of 06/13/12  . ED EKG  . ED EKG  . EKG 12-Lead  . EKG 12-Lead  . EKG    ASSESSMENT AND PLAN:   29 year old femlae with past medical history of generalized anxiety disorder, severe alcohol dependence history of alcohol-induced hepatitis comes into the emergency room with   1 generalized abdominal pain with nausea and vomiting and significant weight loss of 30 pounds in 1-1/2 months.  S/p EGD which was normal Pt requesting PEG placement as recommended by GI, dr .Eliott Nine will place it on mon /Tuesday HIV and TSH normal Psych is also following Patient will require outpatient workup for her weight loss. biopsies were taken during EGD which should be followed up on. She also has c/s which showed possible ulcer distal rectum. Biopsies were taken  2 abnormal CT scan showing pancolitis and enlarged CBD: patient underwent c/s further workup per GI.   3 alcohol dependence with ongoing alcohol abuse. Continue CIWA and apprecaite PSYCH consult    4 generalized anxiety : seen and evaluated by Dr Weber Cooks who recommends not starting Xanax as this is not appropriate medication for a patient with alcohol abuse and does not treat generalized anxiety disorder. He does not recommend him restart benzodiazepines. Pt recommended  Different psychiartrist eval , asked dr.Chall to see the patient per pt request  #5 DVT prophylaxis - pt is ambulatory ,  D/c subcutaneous heparin , she is refusing       Management plans discussed with the patient and she is in agreement CODE STATUS: full  TOTAL TIME TAKING CARE OF THIS PATIENT: 35 minutes.   POSSIBLE D/C IN 1-2  DAYS, DEPENDING ON CLINICAL CONDITION.   Nicholes Mango M.D on 01/07/2015 at 7:52 PM  Between 7am to 6pm - Pager - [phone] After 6pm go to [email or web address] - password [email or web address] [email or web address]  Office  [phone]  CC: Primary care physician; PROVIDER NOT IN SYSTEM

## 2015-01-08 ENCOUNTER — Encounter: Payer: Self-pay | Admitting: Gastroenterology

## 2015-01-08 LAB — HEPATIC FUNCTION PANEL
ALT: 92 U/L — ABNORMAL HIGH (ref 14–54)
AST: 195 U/L — AB (ref 15–41)
Albumin: 2.6 g/dL — ABNORMAL LOW (ref 3.5–5.0)
Alkaline Phosphatase: 124 U/L (ref 38–126)
Bilirubin, Direct: 0.3 mg/dL (ref 0.1–0.5)
Indirect Bilirubin: 1 mg/dL — ABNORMAL HIGH (ref 0.3–0.9)
TOTAL PROTEIN: 5.6 g/dL — AB (ref 6.5–8.1)
Total Bilirubin: 1.3 mg/dL — ABNORMAL HIGH (ref 0.3–1.2)

## 2015-01-08 MED ORDER — PANCRELIPASE (LIP-PROT-AMYL) 12000-38000 UNITS PO CPEP
36000.0000 [IU] | ORAL_CAPSULE | Freq: Three times a day (TID) | ORAL | Status: DC
  Administered 2015-01-08 – 2015-01-13 (×12): 36000 [IU] via ORAL
  Filled 2015-01-08 (×18): qty 3

## 2015-01-08 MED ORDER — LORAZEPAM 2 MG PO TABS
2.0000 mg | ORAL_TABLET | Freq: Four times a day (QID) | ORAL | Status: DC | PRN
  Administered 2015-01-08 – 2015-01-11 (×7): 2 mg via ORAL
  Filled 2015-01-08 (×7): qty 1

## 2015-01-08 MED ORDER — LORAZEPAM 2 MG/ML IJ SOLN: 2.0000 mg | Freq: Four times a day (QID) | INTRAMUSCULAR | Status: DC | PRN

## 2015-01-08 NOTE — Consult Note (Signed)
  GI Inpatient Follow-up Note  Patient Identification: Kristine Macias is a 29 y.o. female with malnutrition and possible IBD. Covering for Dr. Rayann Macias.  Subjective:Starting to eat though has GI upset after eating. Overall oral intake still poor. Agreeable to having G tube placement. Having tremors. Starting to have withdrawal symptoms.  Scheduled Inpatient Medications:  . feeding supplement (RESOURCE BREEZE)  1 Container Oral TID WC  . folic acid  1 mg Oral Daily  . heparin  5,000 Units Subcutaneous 3 times per day  . lipase/protease/amylase  36,000 Units Oral TID AC  . mirtazapine  15 mg Oral QHS  . multivitamin with minerals  1 tablet Oral Daily  . thiamine  100 mg Oral Daily   Or  . thiamine  100 mg Intravenous Daily    Continuous Inpatient Infusions:     PRN Inpatient Medications:  acetaminophen **OR** acetaminophen, LORazepam **OR** LORazepam, ondansetron **OR** ondansetron (ZOFRAN) IV  Review of Systems: Constitutional: Weight is stable.  Eyes: No changes in vision. ENT: No oral lesions, sore throat.  GI: see HPI.  Heme/Lymph: No easy bruising.  CV: No chest pain.  GU: No hematuria.  Integumentary: No rashes.  Neuro: No headaches.  Psych: No depression/anxiety.  Endocrine: No heat/cold intolerance.  Allergic/Immunologic: No urticaria.  Resp: No cough, SOB.  Musculoskeletal: No joint swelling.    Physical Examination: BP 115/83 mmHg  Pulse 77  Temp(Src) 98.6 F (37 C) (Oral)  Resp 18  Ht 5' 6"  (1.676 m)  Wt 40.96 kg (90 lb 4.8 oz)  BMI 14.58 kg/m2  SpO2 100%  LMP 11/24/2014 Gen: NAD, alert and oriented x 4; little anxious. HEENT: PEERLA, EOMI, Neck: supple, no JVD or thyromegaly Chest: CTA bilaterally, no wheezes, crackles, or other adventitious sounds CV: RRR, no m/g/c/r Abd: soft, NT, ND, +BS in all four quadrants; no HSM, guarding, ridigity, or rebound tenderness Ext: no edema, well perfused with 2+ pulses, tremors present. Skin: no rash or lesions  noted Lymph: no LAD  Data: Lab Results  Component Value Date   WBC 5.3 01/06/2015   HGB 9.9* 01/06/2015   HCT 29.9* 01/06/2015   MCV 103.0* 01/06/2015   PLT 96* 01/06/2015    Recent Labs Lab 01/04/15 1145 01/06/15 0613  HGB 14.0 9.9*   Lab Results  Component Value Date   NA 136 01/06/2015   K 3.8 01/06/2015   CL 109 01/06/2015   CO2 23 01/06/2015   BUN <5* 01/06/2015   CREATININE 0.45 01/06/2015   Lab Results  Component Value Date   ALT 92* 01/08/2015   AST 195* 01/08/2015   ALKPHOS 124 01/08/2015   BILITOT 1.3* 01/08/2015    Recent Labs Lab 01/06/15 1527  INR 1.06   Assessment/Plan: [name] is a 29 y.o. female with severe malnutrition; Alcoholic withdrawal.   Recommendations: Dr. Rayann Macias to check back on Monday to possibly schedule PEG on Tues. Make sure pt is CIWA protocol. Cannot schedule EGD if pt is in active withdrawal. Please call if condition deteriorates tomorrow. thanks  Kristine Macias, Kristine Dawn, MD

## 2015-01-08 NOTE — Plan of Care (Signed)
Problem: Discharge Progression Outcomes Goal: Discharge plan in place and appropriate Individualization of Care  Outcome: Progressing 1. Lives at home with mother and her 2 children. 2. Admits to ETOH abuse and verbalizes understanding she is in withdrawal. 3. Reports has lost 13 lbs in past month;only able to tolerate full liquid diet.     Goal: Other Discharge Outcomes/Goals Outcome: Progressing Plan of Care Progressing to Goal: No complaints of pain. Ativan given x 1 with noted effectiveness for CIWAA.

## 2015-01-08 NOTE — Plan of Care (Signed)
Problem: Discharge Progression Outcomes Goal: Discharge plan in place and appropriate Individualization of Care  Outcome: Progressing Individualization: 1. Lives at home with mother and her 2 children. 2. Admits to ETOH abuse and verbalizes understanding she is in withdrawal. 3. Reports has lost 13 lbs in past month;only able to tolerate full liquid diet. 4. HIGH risk for fall- reports multiple falls at home-bed alarm activated with education done.          Goal: Other Discharge Outcomes/Goals Outcome: Progressing  Plan of care progress to goal for: Nausea and Vomiting - Continues on CIWA, ativan given per CIWA scale. - Assist to BR. Will continue to monitor.

## 2015-01-08 NOTE — Progress Notes (Addendum)
Patient ID: Kristine Macias, female   DOB: 19-Jun-1986, 29 y.o.   MRN: [id number] [place] [facility] [place] NAME: Kristine Macias    MR#:  [id number]  DATE OF BIRTH:  09-12-1985  SUBJECTIVE:  CHIEF COMPLAINT:   Chief Complaint  Patient presents with  . Nausea  . Emesis    Review of Systems  Constitutional: Positive for weight loss. Negative for fever and chills.  Eyes: Negative for blurred vision.  Respiratory: Negative for cough and wheezing.   Cardiovascular: Negative for chest pain, leg swelling and PND.  Gastrointestinal: Positive for nausea and diarrhea. Negative for abdominal pain.  Genitourinary: Negative for dysuria.  Musculoskeletal: Negative for myalgias.  Skin: Negative for rash.  Neurological: Negative for dizziness and headaches.    Feels satisfactory, for planned EGD next week, no nausea at present, would like to to try ensure mild shake.  Patient underwent EGD 5.13.16, normal, pt  Is going through withdrawals and needs ativan she is tremulous. She drink over a 6 pack a day. She has had weightloss about 25 pounds in past month. She thinks she is doing good with short Acting Benzos, wants PEG tube placement No melena VITAL SIGNS: Blood pressure 115/83, pulse 77, temperature 98.6 F (37 C), temperature source Oral, resp. rate 18, height 5' 6"  (1.676 m), weight 40.96 kg (90 lb 4.8 oz), last menstrual period 11/24/2014, SpO2 100 %.  PHYSICAL EXAMINATION:   GENERAL:  29 y.o.-year-old patient lying in the bed with no acute distress.  EYES: Pupils equal, round, reactive to light and accommodation. No scleral icterus. Extraocular muscles intact.  HEENT: Head atraumatic, normocephalic. Oropharynx and nasopharynx clear.  NECK:  Supple, no jugular venous distention. No thyroid enlargement, no tenderness.  LUNGS: Normal breath sounds bilaterally, no wheezing, rales,rhonchi or crepitation. No use of accessory muscles of  respiration.  CARDIOVASCULAR: S1, S2 normal. No murmurs, rubs, or gallops.  ABDOMEN: Soft, nontender, nondistended. Bowel sounds present. No organomegaly or mass.  EXTREMITIES: No pedal edema, cyanosis, or clubbing.  NEUROLOGIC: Cranial nerves II through XII are intact. Muscle strength 5/5 in all extremities. Sensation intact. Gait not checked.  PSYCHIATRIC: The patient is alert and oriented x 3.  SKIN: No obvious rash, lesion, or ulcer.   ORDERS/RESULTS REVIEWED:   CBC  Recent Labs Lab 01/04/15 1145 01/06/15 0613  WBC 5.5 5.3  HGB 14.0 9.9*  HCT 40.4 29.9*  PLT 191 96*  MCV 100.3* 103.0*  MCH 34.7* 34.2*  MCHC 34.5 33.2  RDW 20.9* 22.4*  LYMPHSABS 1.1  --   MONOABS 0.2  --   EOSABS 0.0  --   BASOSABS 0.0  --    ------------------------------------------------------------------------------------------------------------------  Chemistries   Recent Labs Lab 01/04/15 1145 01/06/15 0613  NA 134* 136  K 3.2* 3.8  CL 95* 109  CO2 26 23  GLUCOSE 117* 95  BUN 10 <5*  CREATININE 0.52 0.45  CALCIUM 8.1* 7.4*  MG 1.7  --   AST 431*  --   ALT 145*  --   ALKPHOS 223*  --   BILITOT 1.0  --    ------------------------------------------------------------------------------------------------------------------ estimated creatinine clearance is 67.2 mL/min (by C-G formula based on Cr of 0.45). ------------------------------------------------------------------------------------------------------------------ No results for input(s): TSH, T4TOTAL, T3FREE, THYROIDAB in the last 72 hours.  Invalid input(s): FREET3  Cardiac Enzymes No results for input(s): CKMB, TROPONINI, MYOGLOBIN in the last 168 hours.  Invalid input(s): CK ------------------------------------------------------------------------------------------------------------------ Invalid input(s):  POCBNP ---------------------------------------------------------------------------------------------------------------  RADIOLOGY: No  results found.  EKG:  Orders placed or performed during the hospital encounter of 06/13/12  . ED EKG  . ED EKG  . EKG 12-Lead  . EKG 12-Lead  . EKG    ASSESSMENT AND PLAN: 29 year old femlae with past medical history of generalized anxiety disorder, severe alcohol dependence history of alcohol-induced hepatitis comes into the emergency room with   1 generalized abdominal pain with nausea and vomiting and significant weight loss of 30 pounds in 1-1/2 months.  S/p EGD which was normal Pt requesting PEG placement as recommended by GI, dr .Eliott Nine will place it on mon /Tuesday HIV and TSH normal Psych is also following Patient will require outpatient workup for her weight loss. biopsies were taken during EGD which should be followed up on. She also has c/s which showed possible ulcer distal rectum. Biopsies were taken  2 abnormal CT scan showing pancolitis and enlarged CBD: patient underwent c/s further workup per GI.   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 He does not recommend him restart benzodiazepines. Pt recommended  Different psychiartrist eval , asked dr.Chall to see the patient per pt request  #5 DVT prophylaxis - pt is ambulatory , D/c subcutaneous heparin , she is refusing   #6 diarrhea, add Creon, following, could be pancreatic insufficiency  Management plans discussed with the patient, family and they are in agreement.   DRUG ALLERGIES:  Allergies  Allergen Reactions  . Sulfa Antibiotics Anaphylaxis  . Tetracyclines & Related Anaphylaxis  . Latex  Hives and Itching    CODE STATUS:     Code Status Orders        Start     Ordered   01/04/15 1923  Full code   Continuous     01/04/15 1923      TOTAL TIME TAKING CARE OF THIS PATIENT: 40 min.    Theodoro Grist M.D on 01/08/2015 at 10:18 AM  Between 7am to 6pm - Pager - [phone]  After 6pm go to [email or web address] - password [email or web address] [facility]  Office  [phone]  CC: Primary care physician; PROVIDER NOT IN SYSTEM

## 2015-01-09 LAB — STOOL CULTURE

## 2015-01-09 NOTE — Progress Notes (Signed)
Patient ID: Kristine Macias, female   DOB: 1985/10/05, 29 y.o.   MRN: [id number] [place] [facility] [place] NAME: Kristine Macias    MR#:  [id number]  DATE OF BIRTH:  May 02, 1986  SUBJECTIVE:  CHIEF COMPLAINT:   Chief Complaint  Patient presents with  . Nausea  . Emesis   She feels satisfactory today. Thinks that she is eating well but refuses ensure drinks. No new requests or complaints.  Review of Systems  Constitutional: Negative.   HENT: Negative.   Eyes: Negative.   Respiratory: Negative.   Cardiovascular: Negative.   Gastrointestinal: Negative.   Genitourinary: Negative.   Musculoskeletal: Negative.   Skin: Negative.   Endo/Heme/Allergies: Negative.   Psychiatric/Behavioral: Negative.   All other systems reviewed and are negative.   Feels satisfactory, for planned EGD next week, intermittent nausea.   Patient underwent EGD 5.13.16, normal, pt  Is going through withdrawals and needs ativan she is tremulous. She drink over a 6 pack a day. She has had weight loss about 25 pounds in past month. She thinks she is doing good with short Acting Benzos, wants PEG tube placement No melena VITAL SIGNS: Blood pressure 124/69, pulse 73, temperature 98.7 F (37.1 C), temperature source Oral, resp. rate 18, height 5' 6"  (1.676 m), weight 40.96 kg (90 lb 4.8 oz), last menstrual period 11/24/2014, SpO2 100 %.  PHYSICAL EXAMINATION:   GENERAL:  29 y.o.-year-old patient lying in the bed with no acute distress.  EYES: Pupils equal, round, reactive to light and accommodation. No scleral icterus. Extraocular muscles intact.  HEENT: Head atraumatic, normocephalic. Oropharynx and nasopharynx clear.  NECK:  Supple, no jugular venous distention. No thyroid enlargement, no tenderness.  LUNGS: Normal breath sounds bilaterally, no wheezing, rales,rhonchi or crepitation. No use of accessory muscles of respiration.  CARDIOVASCULAR: S1, S2 normal. No  murmurs, rubs, or gallops.  ABDOMEN: Soft, nontender, nondistended. Bowel sounds present. No organomegaly or mass.  EXTREMITIES: No pedal edema, cyanosis, or clubbing.  NEUROLOGIC: Cranial nerves II through XII are intact. Muscle strength 5/5 in all extremities. Sensation intact. Gait not checked.  PSYCHIATRIC: The patient is alert and oriented x 3.  SKIN: No obvious rash, lesion, or ulcer.   ORDERS/RESULTS REVIEWED:   CBC  Recent Labs Lab 01/04/15 1145 01/06/15 0613  WBC 5.5 5.3  HGB 14.0 9.9*  HCT 40.4 29.9*  PLT 191 96*  MCV 100.3* 103.0*  MCH 34.7* 34.2*  MCHC 34.5 33.2  RDW 20.9* 22.4*  LYMPHSABS 1.1  --   MONOABS 0.2  --   EOSABS 0.0  --   BASOSABS 0.0  --    ------------------------------------------------------------------------------------------------------------------  Chemistries   Recent Labs Lab 01/04/15 1145 01/06/15 0613 01/08/15 1050  NA 134* 136  --   K 3.2* 3.8  --   CL 95* 109  --   CO2 26 23  --   GLUCOSE 117* 95  --   BUN 10 <5*  --   CREATININE 0.52 0.45  --   CALCIUM 8.1* 7.4*  --   MG 1.7  --   --   AST 431*  --  195*  ALT 145*  --  92*  ALKPHOS 223*  --  124  BILITOT 1.0  --  1.3*   ------------------------------------------------------------------------------------------------------------------ estimated creatinine clearance is 67.2 mL/min (by C-G formula based on Cr of 0.45). ------------------------------------------------------------------------------------------------------------------ No results for input(s): TSH, T4TOTAL, T3FREE, THYROIDAB in the last 72 hours.  Invalid input(s): FREET3  Cardiac Enzymes No results for input(s): CKMB, TROPONINI, MYOGLOBIN in the last 168 hours.  Invalid input(s): CK ------------------------------------------------------------------------------------------------------------------ Invalid input(s):  POCBNP ---------------------------------------------------------------------------------------------------------------  RADIOLOGY: No results found.  EKG:  Orders placed or performed during the hospital encounter of 06/13/12  . ED EKG  . ED EKG  . EKG 12-Lead  . EKG 12-Lead  . EKG    ASSESSMENT AND PLAN: 29 year old femlae with past medical history of generalized anxiety disorder, severe alcohol dependence history of alcohol-induced hepatitis comes into the emergency room with   1 generalized abdominal pain with nausea and vomiting and significant weight loss of 30 pounds in 1-1/2 months.  S/p EGD which was normal, noW had Creon added and claims that her oral intake has improved. Ins and outs seemed to be positive by 570 cc.  Pt requesting PEG placement as recommended by GI, dr .Eliott Nine will place it on Mon /Tuesday HIV and TSH normal Psych is also following Patient will require outpatient workup for her weight loss. biopsies were taken during EGD and colonoscopy which should be followed up on. She also has c/s which showed possible ulcer distal rectum. Biopsies were taken, results are unremarkable. Continue Creon at present as well as PPI  2 abnormal CT scan showing pancolitis and enlarged CBD: patient underwent c/s further workup per GI.   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 He does not recommend him restart benzodiazepines. Pt wanted different psychiartrist eval , asked dr.Chall to see the patient per pt request, no consult yet done  #5 DVT prophylaxis - pt is ambulatory , D/c subcutaneous heparin , she is refusing   #6 diarrhea, continue Creon, following, could be pancreatic  insufficiency  Management plans discussed with the patient and we are  in agreement.   DRUG ALLERGIES:  Allergies  Allergen Reactions  . Sulfa Antibiotics Anaphylaxis  . Tetracyclines & Related Anaphylaxis  . Latex Hives and Itching    CODE STATUS:     Code Status Orders        Start     Ordered   01/04/15 1923  Full code   Continuous     01/04/15 1923      TOTAL TIME TAKING CARE OF THIS PATIENT: 34mn.    VTheodoro GristM.D on 01/09/2015 at 1:09 PM  Between 7am to 6pm - Pager - [phone]  After 6pm go to [email or web address] - password [email or web address]  Office  [phone] CC: Primary care physician; PROVIDER NOT IN SYSTEM

## 2015-01-09 NOTE — Plan of Care (Signed)
Problem: Discharge Progression Outcomes Goal: Discharge plan in place and appropriate Individualization of Care  Outcome: Progressing 1. Lives at home with mother and her 2 children. 2. Admits to ETOH abuse and verbalizes understanding she is in withdrawal. 3. Reports has lost 13 lbs in past month;only able to tolerate full liquid diet.       Goal: Other Discharge Outcomes/Goals Outcome: Progressing Plan of Care Progressing to Goal: No complaints of pain. Ativan given x 1 with noted effectiveness for CIWAA.

## 2015-01-09 NOTE — Plan of Care (Signed)
Problem: Discharge Progression Outcomes Goal: Discharge plan in place and appropriate Individualization of Care  Outcome: Progressing Individualization of Care   1. Lives at home with mother and her 2 children. 2. Admits to ETOH abuse and verbalizes understanding she is in withdrawal. 3. Reports has lost 13 lbs in past month;only able to tolerate full liquid diet.         Goal: Other Discharge Outcomes/Goals Outcome: Progressing  Plan of Care Progressing to Goal: No complaints of pain. Ativan given x 1 with noted effectiveness for CIWAA. Pt has started her menstrual cycle this shift, supplies given, weighed pt per her request, she has gained around 4lbs.

## 2015-01-10 LAB — HEPATIC FUNCTION PANEL
ALK PHOS: 87 U/L (ref 38–126)
ALT: 67 U/L — ABNORMAL HIGH (ref 14–54)
AST: 101 U/L — ABNORMAL HIGH (ref 15–41)
Albumin: 2.5 g/dL — ABNORMAL LOW (ref 3.5–5.0)
BILIRUBIN DIRECT: 0.1 mg/dL (ref 0.1–0.5)
Indirect Bilirubin: 0.5 mg/dL (ref 0.3–0.9)
Total Bilirubin: 0.6 mg/dL (ref 0.3–1.2)
Total Protein: 5.1 g/dL — ABNORMAL LOW (ref 6.5–8.1)

## 2015-01-10 LAB — SURGICAL PATHOLOGY

## 2015-01-10 LAB — HEMOGLOBIN: Hemoglobin: 10 g/dL — ABNORMAL LOW (ref 12.0–16.0)

## 2015-01-10 NOTE — Plan of Care (Signed)
Problem: Discharge Progression Outcomes Goal: Discharge plan in place and appropriate Individualization of Care  Outcome: Progressing  Individualization of Care   1. Lives at home with mother and her 2 children. 2. Admits to ETOH abuse and verbalizes understanding she is in withdrawal. 3. Reports has lost 13 lbs in past month;only able to tolerate full liquid diet.           Goal: Other Discharge Outcomes/Goals Outcome: Progressing Plan of care progress to goal:  Discharge: plan not in place as decision needs to be made about peg tube  Hemo stable : VSS remain stable monitor labs  Diet: per pt appetite is better, has gained a few lbs while being here

## 2015-01-10 NOTE — Progress Notes (Signed)
[facility] [place] at [place] NAME: Kristine Macias    MR#:  [id number]  DATE OF BIRTH:  August 28, 1985  SUBJECTIVE:   not happy being on antidepressant, would like to talk with someone about benzo's as she feels she may go in withdrawal.   REVIEW OF SYSTEMS:  Review of Systems  Constitutional: Positive for weight loss (chronic > 30 lbs). Negative for fever and chills.  HENT: Negative for nosebleeds and sore throat.   Eyes: Negative for blurred vision.  Respiratory: Negative for cough, shortness of breath and wheezing.   Cardiovascular: Negative for chest pain, orthopnea, leg swelling and PND.  Gastrointestinal: Positive for nausea and abdominal pain. Negative for heartburn, vomiting, diarrhea and constipation.  Genitourinary: Negative for dysuria and urgency.  Musculoskeletal: Negative for back pain.  Skin: Negative for rash.  Neurological: Negative for dizziness, speech change, focal weakness and headaches.  Endo/Heme/Allergies: Does not bruise/bleed easily.  Psychiatric/Behavioral: Positive for depression. The patient is nervous/anxious.     DRUG ALLERGIES:   Allergies  Allergen Reactions  . Sulfa Antibiotics Anaphylaxis  . Tetracyclines & Related Anaphylaxis  . Latex Hives and Itching    VITALS:  Blood pressure 120/62, pulse 71, temperature 98.3 F (36.8 C), temperature source Oral, resp. rate 18, height 5' 6"  (1.676 m), weight 40.96 kg (90 lb 4.8 oz), last menstrual period 11/24/2014, SpO2 100 %.  PHYSICAL EXAMINATION:  GENERAL:  28 y.o.-year-old patient lying in the bed with no acute distress.  EYES: Pupils equal, round, reactive to light and accommodation. No scleral icterus. Extraocular muscles intact.  HEENT: Head atraumatic, normocephalic. Oropharynx and nasopharynx clear.  NECK:  Supple, no jugular venous distention. No thyroid enlargement, no tenderness.  LUNGS: Normal breath sounds bilaterally, no wheezing, rales,rhonchi  or crepitation. No use of accessory muscles of respiration.  CARDIOVASCULAR: S1, S2 normal. No murmurs, rubs, or gallops.  ABDOMEN: Soft, nontender, nondistended. Bowel sounds present. No organomegaly or mass.  EXTREMITIES: No pedal edema, cyanosis, or clubbing.  NEUROLOGIC: Cranial nerves II through XII are intact. Muscle strength 5/5 in all extremities. Sensation intact. Gait not checked.  PSYCHIATRIC: The patient is alert and oriented x 3.  SKIN: No obvious rash, lesion, or ulcer.    LABORATORY PANEL:   CBC  Recent Labs Lab 01/06/15 0613 01/10/15 0449  WBC 5.3  --   HGB 9.9* 10.0*  HCT 29.9*  --   PLT 96*  --    ------------------------------------------------------------------------------------------------------------------  Chemistries   Recent Labs Lab 01/04/15 1145 01/06/15 0613  01/10/15 0449  NA 134* 136  --   --   K 3.2* 3.8  --   --   CL 95* 109  --   --   CO2 26 23  --   --   GLUCOSE 117* 95  --   --   BUN 10 <5*  --   --   CREATININE 0.52 0.45  --   --   CALCIUM 8.1* 7.4*  --   --   MG 1.7  --   --   --   AST 431*  --   < > 101*  ALT 145*  --   < > 67*  ALKPHOS 223*  --   < > 87  BILITOT 1.0  --   < > 0.6  < > = values in this interval not displayed. ------------------------------------------------------------------------------------------------------------------   ASSESSMENT AND PLAN:   28 year old femlae with past medical history of generalized anxiety disorder, severe alcohol  dependence history of alcohol-induced hepatitis comes into the emergency room with   1.Generalized abdominal pain with nausea and vomiting and significant weight loss of 30 pounds in 1-1/2 months.  S/p EGD which was normal, noW had Creon added and claims that her oral intake has improved. Ins and outs seemed to be positive by 570 cc.  Pt requesting PEG placement as recommended by GI, dr .Kristine Macias planning it tomorrow. HIV and TSH normal Psych is also following - d/w  behavioral medicine - psychologist will see her today Patient will require outpatient workup for her weight loss. biopsies were taken during EGD and colonoscopy which should be followed up on. She also has c/s which showed possible ulcer distal rectum. Biopsies were taken, results are unremarkable. Continue Creon at present as well as PPI  2. abnormal CT scan showing pancolitis and enlarged CBD: patient underwent c/s further workup per GI.  3. 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 generalized anxiety : seen and evaluated by Dr Kristine Macias who recommends not starting Xanax as this is not appropriate medication for a patient with alcohol abuse and does not treat generalized anxiety disorder. He does not recommend him restart benzodiazepines. Pt wanted different psychiartrist eval , asked dr.Chall to see the patient per pt request, no consult yet done - will ask psychologist to see her.  #5 DVT prophylaxis - pt is ambulatory , D/c subcutaneous heparin , she is refusing   #6 diarrhea, continue Creon, following, could be pancreatic insufficiency    All the records are reviewed and case discussed with Care Management/Social Workerr. Management plans discussed with the patient, family and they are in agreement.  CODE STATUS: Full   TOTAL TIME TAKING CARE OF THIS PATIENT: 35 minutes.   POSSIBLE D/C IN 1-2 DAYS, DEPENDING ON CLINICAL CONDITION.   [facility], Kristine Macias M.D on 01/10/2015 at 3:44 PM  Between 7am to 6pm - Pager - [phone]  After 6pm go to [email or web address] - password [email or web address] [facility]  Office  [phone]  CC: Primary care physician; PROVIDER NOT IN SYSTEM

## 2015-01-10 NOTE — Plan of Care (Signed)
Problem: Discharge Progression Outcomes Goal: Discharge plan in place and appropriate Individualization of Care  Individualization of Care    1. Lives at home with mother and her 2 children. 2. Admits to ETOH abuse and verbalizes understanding she is in withdrawal. 3. Reports has lost 13 lbs in past month;only able to tolerate full liquid diet. Goal: Other Discharge Outcomes/Goals Plan of care progress to goal:  Encouraging intake. No nausea, vomiting or diarrhea. New psych MD consulted. Pt on CIWA-AR, slight tremors remain. Possible PEG tube placement tomorrow.

## 2015-01-10 NOTE — Consult Note (Signed)
  Dr. Manuella Ghazi phoned and requested that I see the patient. He stated that Kristine Macias is upset over medications and not feeling heard. Spoke with patient's RN Junie Panning. Kristine Macias is  admitted for pancolitis and malnutrition. Patient also being followed by Dr. Weber Cooks. Kristine Macias states her concern is for medication following her discharge, specifically her benzodiazepines. Kristine Macias states she fears going into benzodiazepine withdrawal .She reports being at [facility] about 3 months ago but not being able to follow up with her outpatient appointments for various reasons .She also reports having a diagnosis of generalized anxiety disorder since age 29. She reports having been tried on many antidepressants which did not work or to which she was allergic and that xanax had worked for her. She also reports going to outpatient therapy, learning skills to help control her anxiety and being tapered off benzos in the past. Currently she is on CIWA and not requesting much medication. Kristine Macias knows she can request ativan. She was encouraged to use the skills she learned in the past to help control her anxiety. She was informed a social work consult was already in place to help her plan her post hospitalization needs. Spoke with SW Judson Roch who confirmed the consult and stated she would not be in tomorrow to see Kristine Macias but would hand off to the next SW. Judson Roch also informed that the SW would give patient information about psychiatric follow-up and it would be up to Jardine to make the appointment. Kristine Macias informed the SW consult may happen Tue or Wed depending on her medical procedure and that she would be given information to make follow up psychiatric appointment. Kristine Macias appeared satisfied with this outcome.

## 2015-01-10 NOTE — Consult Note (Signed)
  Psychiatry: Follow-up for this patient with anxiety and alcohol abuse. Patient tells me her nerves or not feeling any different. She has been able to eat but not very quickly. She is not throwing up.  On review of systems continues with the same claims of symptoms as before. Feeling anxious. Nothing particular on her mind that she is worried about.  On mental status exam thin disheveled woman doesn't look like she is keeping up very much with her self-care. Eye contact intermittent psychomotor activity a little slow. Speech quiet. Affect blunted. Looks depressed. Denies suicidal or homicidal ideation. Denies hallucinations.  I understand the plan is still in place for her to have a feeding tube. Supportive counseling done. I will for now continue the mirtazapine at 15 mg a day although I am thinking we should probably try increasing it to 30 tomorrow before she goes home to get a more full antidepressant dose. No other change to treatment plan

## 2015-01-11 ENCOUNTER — Inpatient Hospital Stay: Payer: Medicaid Other | Admitting: Anesthesiology

## 2015-01-11 ENCOUNTER — Encounter
Admission: EM | Disposition: A | Discharge status: Home Health | Payer: Self-pay | Source: Home / Self Care | Attending: Internal Medicine

## 2015-01-11 HISTORY — PX: PEG PLACEMENT: SHX5437

## 2015-01-11 HISTORY — PX: ESOPHAGOGASTRODUODENOSCOPY: SHX5428

## 2015-01-11 LAB — PLATELET COUNT: Platelets: 161 10*3/uL (ref 150–440)

## 2015-01-11 LAB — C-REACTIVE PROTEIN

## 2015-01-11 SURGERY — INSERTION, PEG TUBE
Anesthesia: General

## 2015-01-11 MED ORDER — CEFAZOLIN SODIUM 1-5 GM-% IV SOLN
1.0000 g | INTRAVENOUS | Status: AC
  Administered 2015-01-11: 1 g via INTRAVENOUS
  Filled 2015-01-11: qty 50

## 2015-01-11 MED ORDER — DIPHENHYDRAMINE HCL 25 MG PO CAPS
25.0000 mg | ORAL_CAPSULE | Freq: Every evening | ORAL | Status: DC | PRN
  Administered 2015-01-12: 25 mg via ORAL
  Filled 2015-01-11: qty 1

## 2015-01-11 MED ORDER — SODIUM CHLORIDE 0.9 % IJ SOLN
3.0000 mL | INTRAMUSCULAR | Status: DC | PRN
  Administered 2015-01-11: 22:00:00 3 mL via INTRAVENOUS

## 2015-01-11 MED ORDER — HYDROMORPHONE HCL 1 MG/ML IJ SOLN
1.0000 mg | Freq: Once | INTRAMUSCULAR | Status: AC
  Administered 2015-01-11: 1 mg via INTRAVENOUS
  Filled 2015-01-11: qty 1

## 2015-01-11 MED ORDER — MIRTAZAPINE 30 MG PO TABS
30.0000 mg | ORAL_TABLET | Freq: Every day | ORAL | Status: DC
  Filled 2015-01-11 (×2): qty 1

## 2015-01-11 MED ORDER — PROPOFOL INFUSION 10 MG/ML OPTIME
INTRAVENOUS | Status: DC | PRN
  Administered 2015-01-11: 140 ug/kg/min via INTRAVENOUS

## 2015-01-11 MED ORDER — SODIUM CHLORIDE 0.9 % IV SOLN
INTRAVENOUS | Status: DC
  Administered 2015-01-11: 03:00:00 via INTRAVENOUS

## 2015-01-11 MED ORDER — BACITRACIN 500 UNIT/GM EX OINT
1.0000 "application " | TOPICAL_OINTMENT | Freq: Two times a day (BID) | CUTANEOUS | Status: DC
  Administered 2015-01-11 – 2015-01-12 (×3): 1 via TOPICAL
  Filled 2015-01-11 (×6): qty 0.9

## 2015-01-11 MED ORDER — TRAZODONE HCL 50 MG PO TABS: 25.0000 mg | ORAL_TABLET | Freq: Every day | ORAL | Status: DC

## 2015-01-11 MED ORDER — FENTANYL CITRATE (PF) 100 MCG/2ML IJ SOLN
INTRAMUSCULAR | Status: DC | PRN
  Administered 2015-01-11: 50 ug via INTRAVENOUS

## 2015-01-11 MED ORDER — MIDAZOLAM HCL 2 MG/2ML IJ SOLN
INTRAMUSCULAR | Status: DC | PRN
  Administered 2015-01-11: 1 mg via INTRAVENOUS

## 2015-01-11 NOTE — Interval H&P Note (Signed)
History and Physical Interval Note:  01/11/2015 12:52 PM  Kristine Macias  has presented today for surgery, with the diagnosis of malnutrition  The various methods of treatment have been discussed with the patient and family. After consideration of risks, benefits and other options for treatment, the patient has consented to  Procedure(s): PERCUTANEOUS ENDOSCOPIC GASTROSTOMY (PEG) PLACEMENT (N/A) ESOPHAGOGASTRODUODENOSCOPY (EGD) (N/A) as a surgical intervention .  The patient's history has been reviewed, patient examined, no change in status, stable for surgery.  I have reviewed the patient's chart and labs.  Questions were answered to the patient's satisfaction.     [name]

## 2015-01-11 NOTE — Consult Note (Signed)
  Psychiatry: Follow-up note for this 29 year old woman with anxiety and what I suspect to some degree of depression as well as alcohol abuse. She says that she is having some pain in the incision site this evening but doesn't have any new or different complaints about her mood or anxiety. Continues to say that she feels nervous a lot. Has been able to keep some clear liquids down and is not vomiting.  On review of systems she complains of an appropriate level of pain at the incision site. Continues to describe feeling anxious although she does not seem to describe it in the same extreme terms has previously.  On mental status patient continues to be a underway woman who is cooperative and pleasant. I actually had the impression that she was more pleasant and relaxed tonight than on previous visits. Eye contact good. Psychomotor activity normal. Speech normal rate and tone. Affect smiling reactive somewhat anxious. Mood stated as being all right. Patient acknowledges having optimism about her current situation. No sign of psychosis. She is appropriately educated and knowledgeable about land.  Continue the mirtazapine. I won't increase the dose of it tonight since she just had a procedure. Possibly increase it for tomorrow night. Supportive counseling completed. No change to diagnosis

## 2015-01-11 NOTE — Plan of Care (Signed)
Problem: Discharge Progression Outcomes Goal: Tolerating diet Outcome: Not Progressing Pt has food beside bed and eating bites of food, pt made NPO after midnight for peg placement.

## 2015-01-11 NOTE — Progress Notes (Signed)
[place] at [place] NAME: Kristine Macias    MR#:  [id number]  DATE OF BIRTH:  1986-01-28  SUBJECTIVE:  Sleepy, waiting for PEG tube placement today   REVIEW OF SYSTEMS:  Review of Systems  Constitutional: Negative for fever, chills and weight loss.  HENT: Negative for nosebleeds and sore throat.   Eyes: Negative for blurred vision.  Respiratory: Negative for cough, shortness of breath and wheezing.   Cardiovascular: Negative for chest pain, orthopnea, leg swelling and PND.  Gastrointestinal: Negative for heartburn, nausea, vomiting, abdominal pain, diarrhea and constipation.  Genitourinary: Negative for dysuria and urgency.  Musculoskeletal: Negative for back pain.  Skin: Negative for rash.  Neurological: Negative for dizziness, speech change, focal weakness and headaches.  Endo/Heme/Allergies: Does not bruise/bleed easily.  Psychiatric/Behavioral: Positive for depression.    DRUG ALLERGIES:   Allergies  Allergen Reactions  . Sulfa Antibiotics Anaphylaxis  . Tetracyclines & Related Anaphylaxis  . Latex Hives and Itching    VITALS:  Blood pressure 105/57, pulse 57, temperature 98.6 F (37 C), temperature source Oral, resp. rate 18, height 5' 6"  (1.676 m), weight 40.96 kg (90 lb 4.8 oz), last menstrual period 11/24/2014, SpO2 100 %.  PHYSICAL EXAMINATION:  GENERAL:  29 y.o.-year-old patient lying in the bed with no acute distress.  EYES: Pupils equal, round, reactive to light and accommodation. No scleral icterus. Extraocular muscles intact.  HEENT: Head atraumatic, normocephalic. Oropharynx and nasopharynx clear.  NECK:  Supple, no jugular venous distention. No thyroid enlargement, no tenderness.  LUNGS: Normal breath sounds bilaterally, no wheezing, rales,rhonchi or crepitation. No use of accessory muscles of respiration.  CARDIOVASCULAR: S1, S2 normal. No murmurs, rubs, or gallops.  ABDOMEN: Soft, nontender,  nondistended. Bowel sounds present. No organomegaly or mass.  EXTREMITIES: No pedal edema, cyanosis, or clubbing.  NEUROLOGIC: Cranial nerves II through XII are intact. Muscle strength 5/5 in all extremities. Sensation intact. Gait not checked.  PSYCHIATRIC: sleepy, anxious SKIN: No obvious rash, lesion, or ulcer.    LABORATORY PANEL:   CBC  Recent Labs Lab 01/06/15 0613 01/10/15 0449 01/11/15 0543  WBC 5.3  --   --   HGB 9.9* 10.0*  --   HCT 29.9*  --   --   PLT 96*  --  161   ------------------------------------------------------------------------------------------------------------------  Chemistries   Recent Labs Lab 01/04/15 1145 01/06/15 0613  01/10/15 0449  NA 134* 136  --   --   K 3.2* 3.8  --   --   CL 95* 109  --   --   CO2 26 23  --   --   GLUCOSE 117* 95  --   --   BUN 10 <5*  --   --   CREATININE 0.52 0.45  --   --   CALCIUM 8.1* 7.4*  --   --   MG 1.7  --   --   --   AST 431*  --   < > 101*  ALT 145*  --   < > 67*  ALKPHOS 223*  --   < > 87  BILITOT 1.0  --   < > 0.6  < > = values in this interval not displayed. ------------------------------------------------------------------------------------------------------------------   ASSESSMENT AND PLAN:   29 year old femlae with past medical history of generalized anxiety disorder, severe alcohol dependence history of alcohol-induced hepatitis comes into the emergency room with   1.Generalized abdominal pain with nausea and vomiting and significant weight loss  of 30 pounds in 1-1/2 months. PEG tube placement today  2. Abnormal CT scan showing pancolitis and enlarged CBD: patient underwent c/s further workup per GI.  3. 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 Generalized anxiety: on remeron, will increase the dose to 30 mg per psych  #5 DVT prophylaxis - pt is ambulatory , D/c subcutaneous heparin , she is refusing   #6  diarrhea, continue Creon, following, could be pancreatic insufficiency    All the records are reviewed and case discussed with Care Management/Social Workerr. Management plans discussed with the patient, family and they are in agreement.  CODE STATUS: Full   TOTAL TIME TAKING CARE OF THIS PATIENT: 35 minutes.   POSSIBLE D/C IN am. DEPENDING ON CLINICAL CONDITION. Will need to make sure she is able to tolerate tube feedings. Will c/s dietitian   Max Sane M.D on 01/11/2015 at 10:07 AM  Between 7am to 6pm - Pager - [phone]  After 6pm go to [email or web address] - password [email or web address] [facility]  Office  [phone]  CC: Primary care physician; PROVIDER NOT IN SYSTEM

## 2015-01-11 NOTE — Op Note (Signed)
[facility] Patient Name: Kristine Macias Procedure Date: 01/11/2015 12:43 PM MRN: [id number] Account #: [id number] Date of Birth: 02-01-1986 Admit Type: Inpatient Age: 29 Room: [facility] ENDO ROOM 3 Gender: Female Note Status: Finalized Procedure:         Upper GI endoscopy Indications:       Place PEG, , Place PEG because patient is unable to eat,                     Nausea with vomiting, severe malnutrition, weight loss. Patient Profile:   This is a 29 year old female. Providers:         Gerrit Heck. Rayann Heman, MD Referring MD:      No Local Md, MD (Referring MD) Medicines:         Propofol per Anesthesia Complications:     No immediate complications. Procedure:         Pre-Anesthesia Assessment:                    - Prior to the procedure, a History and Physical was                     performed, and patient medications and allergies were                     reviewed. The patient is competent. The risks and benefits                     of the procedure and the sedation options and risks were                     discussed with the patient. All questions were answered                     and informed consent was obtained. Patient identification                     and proposed procedure were verified by the physician and                     the nurse in the pre-procedure area. Mental Status                     Examination: alert and oriented. Airway Examination:                     normal oropharyngeal airway and neck mobility. Respiratory                     Examination: clear to auscultation. CV Examination: RRR,                     no murmurs, no S3 or S4. Prophylactic Antibiotics: The                     patient requires prophylactic antibiotics for planned PEG                     placement. The patient received antibiotic therapy today,                     before the procedure started. Prior Anticoagulants: The  patient has taken no  previous anticoagulant or                     antiplatelet agents. ASA Grade Assessment: III - A patient                     with severe systemic disease. After reviewing the risks                     and benefits, the patient was deemed in satisfactory                     condition to undergo the procedure. The anesthesia plan                     was to use monitored anesthesia care (MAC). Immediately                     prior to administration of medications, the patient was                     re-assessed for adequacy to receive sedatives. The heart                     rate, respiratory rate, oxygen saturations, blood                     pressure, adequacy of pulmonary ventilation, and response                     to care were monitored throughout the procedure. The                     physical status of the patient was re-assessed after the                     procedure.                    After obtaining informed consent, the endoscope was passed                     under direct vision. Throughout the procedure, the                     patient's blood pressure, pulse, and oxygen saturations                     were monitored continuously. The Olympus GIF-160 endoscope                     (S#. [id number]) was introduced through the mouth, and                     advanced to the second part of duodenum. The upper GI                     endoscopy was accomplished without difficulty. The patient                     tolerated the procedure well. Findings:      The esophagus was normal.      The stomach was normal.      The examined duodenum was normal.      The patient was placed in the supine position for PEG placement. The  stomach was insufflated to appose gastric and abdominal walls. A site       was located in the body of the stomach with excellent transillumination       for placement. The abdominal wall was marked and prepped in a sterile       manner. The area was anesthetized  with 3 mL of 1% lidocaine. The trocar       needle was introduced through the abdominal wall and into the stomach       under direct endoscopic view. A snare was introduced through the       endoscope and opened in the gastric lumen. The guide wire was passed       through the trocar and into the open snare. The snare was closed around       the guide wire. The endoscope and snare were removed, pulling the wire       out through the mouth. A skin incision was made at the site of needle       insertion. The externally removable 20 Fr EndoVive Safety gastrostomy       tube was lubricated. The G-tube was tied to the guide wire and pulled       through the mouth and into the stomach. The trocar needle was removed,       and the gastrostomy tube was pulled out from the stomach through the       skin. The external bumper was attached to the gastrostomy tube, and the       tube was cut to remove the guide wire. The final position of the       gastrostomy tube was confirmed by relook endoscopy, and skin marking       noted to be 2 cm at the external bumper. The final tension and       compression of the abdominal wall by the PEG tube and external bumper       were checked and revealed that the bumper was loose and lightly touching       the skin. The feeding tube was capped, and the tube site cleaned and       dressed. Impression:        - Normal esophagus.                    - Normal stomach.                    - Normal examined duodenum.                    - An externally removable PEG placement was successfully                     completed.                    - No specimens collected. Recommendation:    - Observe patient in GI recovery unit.                    - Return patient to hospital ward for ongoing care.                    - Please follow the post-PEG recommendations including:                     Nutrition consult for formula and volume, antibiotic  ointment to  site, change dressing once per day, check site                     for bleeding q 4 hrs, NPO x4 hrs then water today, may use                     PEG today for meds and water and may use PEG tomorrow for                     feedings.                    - Follow up in GI clinic in 2 weeks                    - [place] consult for tube feed initiation.                    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 Procedure Code(s): --- Professional ---                    [phone], Esophagogastroduodenoscopy, flexible, transoral;                     with directed placement of percutaneous gastrostomy tube CPT copyright 2014 [employer]. All rights reserved. The codes documented in this report are preliminary and upon coder review may  be revised to meet current compliance requirements. Mellody Life, MD 01/11/2015 1:30:22 PM This report has been signed electronically. Number of Addenda: 0 Note Initiated On: 01/11/2015 12:43 PM      [facility]

## 2015-01-11 NOTE — Anesthesia Preprocedure Evaluation (Addendum)
Anesthesia Evaluation    Airway Mallampati: II  TM Distance: >3 FB Neck ROM: Full    Dental   Pulmonary Current Smoker (1ppd),          Cardiovascular     Neuro/Psych Anxiety    GI/Hepatic PUD,   Endo/Other    Renal/GU Renal disease (Chronic UTIs)     Musculoskeletal   Abdominal   Peds  Hematology   Anesthesia Other Findings Sp spontaneous peumothorax  Reproductive/Obstetrics                            Anesthesia Physical Anesthesia Plan  ASA: II  Anesthesia Plan: General   Post-op Pain Management:    Induction:   Airway Management Planned: Nasal Cannula  Additional Equipment:   Intra-op Plan:   Post-operative Plan:   Informed Consent: I have reviewed the patients History and Physical, chart, labs and discussed the procedure including the risks, benefits and alternatives for the proposed anesthesia with the patient or authorized representative who has indicated his/her understanding and acceptance.     Plan Discussed with:   Anesthesia Plan Comments:         Anesthesia Quick Evaluation                                  Anesthesia Evaluation  Patient identified by MRN, date of birth, ID band Patient awake    Reviewed: Allergy & Precautions, H&P , NPO status , Patient's Chart, lab work & pertinent test results, reviewed documented beta blocker date and time   Airway Mallampati: II  TM Distance: >3 FB Neck ROM: full    Dental no notable dental hx.    Pulmonary neg pulmonary ROS, Current Smoker,  breath sounds clear to auscultation  Pulmonary exam normal       Cardiovascular Exercise Tolerance: Good negative cardio ROS  Rhythm:regular Rate:Normal     Neuro/Psych Anxiety negative neurological ROS  negative psych ROS   GI/Hepatic negative GI ROS, Neg liver ROS, PUD,   Endo/Other  negative endocrine ROS  Renal/GU Renal diseasenegative Renal ROS   negative genitourinary   Musculoskeletal   Abdominal   Peds  Hematology negative hematology ROS (+)   Anesthesia Other Findings   Reproductive/Obstetrics negative OB ROS                             Anesthesia Physical Anesthesia Plan  ASA: III  Anesthesia Plan: General   Post-op Pain Management:    Induction:   Airway Management Planned:   Additional Equipment:   Intra-op Plan:   Post-operative Plan:   Informed Consent: I have reviewed the patients History and Physical, chart, labs and discussed the procedure including the risks, benefits and alternatives for the proposed anesthesia with the patient or authorized representative who has indicated his/her understanding and acceptance.   Dental Advisory Given  Plan Discussed with: CRNA  Anesthesia Plan Comments:         Anesthesia Quick Evaluation

## 2015-01-11 NOTE — Progress Notes (Signed)
GI Note:  PEG tube placed.    Recs: - clears today - start tube feeds tomorrow and solid food as able to tolerate.  - nutrition consult.  - monitor closely for re-feeding syndrome ( watch K+,Mg, Phos).  - GI clinic f/u in 2 weeks.

## 2015-01-11 NOTE — Plan of Care (Signed)
Problem: Discharge Progression Outcomes Goal: Other Discharge Outcomes/Goals Outcome: Progressing Peg placement today, pt has been NPO since MN except for pills and sips of water. Pt became anxious this evening, relief noted from ativan 32m po.

## 2015-01-11 NOTE — Progress Notes (Signed)
TC to MD on call with pt request for something to sleep with new order obtained. CIWA no longer indicated with orders updated. Pt refused her remeron stating, "I don't like the way it makes me feel." Pt refused trazadone stating, " I'm allergic to it.". Recalled MD with pt report/request with new order obtained.

## 2015-01-11 NOTE — Plan of Care (Addendum)
Problem: Discharge Progression Outcomes Goal: Discharge plan in place and appropriate Individualization of Care  Outcome: Progressing 1. Lives at home with mother and her 2 children. 2. Admits to ETOH abuse and verbalizes understanding she is in withdrawal. 3. Reports has lost 13 lbs in past month;only able to tolerate full liquid diet. Goal: Other Discharge Outcomes/Goals Outcome: Progressing Plan of care progress to goal:  Pt c/o nausea 1x. zofran given. No c/o diarrhea. Pt had PEG tube placed today. C/o pain, dilaudid given 1x. Pt resting quietly after. On clear liquid diet for tonight, plan for advancing diet tomorrow morning. On CIWA-AR, no ativan needed at this time.

## 2015-01-11 NOTE — Anesthesia Postprocedure Evaluation (Signed)
  Anesthesia Post-op Note  Patient: Kristine Macias  Procedure(s) Performed: Procedure(s): PERCUTANEOUS ENDOSCOPIC GASTROSTOMY (PEG) PLACEMENT (N/A) ESOPHAGOGASTRODUODENOSCOPY (EGD) (N/A)  Anesthesia type:General  Patient location: PACU  Post pain: Pain level controlled  Post assessment: Post-op Vital signs reviewed, Patient's Cardiovascular Status Stable, Respiratory Function Stable, Patent Airway and No signs of Nausea or vomiting  Post vital signs: Reviewed and stable  Last Vitals:  Filed Vitals:   01/11/15 1400  BP:   Pulse:   Temp:   Resp: 14    Level of consciousness: awake, alert  and patient cooperative  Complications: No apparent anesthesia complications

## 2015-01-11 NOTE — Anesthesia Procedure Notes (Signed)
Performed by: Jonna Clark Pre-anesthesia Checklist: Patient identified, Emergency Drugs available, Suction available, Patient being monitored and Timeout performed Patient Re-evaluated:Patient Re-evaluated prior to inductionOxygen Delivery Method: Nasal cannula Preoxygenation: Pre-oxygenation with 100% oxygen Intubation Type: IV induction

## 2015-01-11 NOTE — Progress Notes (Signed)
Clinical Education officer, museum (CSW) attempted to meet with patient however she was off the floor getting a Peg Placed. CSW contacted RN when patent returned to the floor and RN reported that patient is in a lot of pain and will be better to follow up with her tomorrow. CSW will continue to follow and assist as needed.   Blima Rich, [place] [phone]

## 2015-01-11 NOTE — Transfer of Care (Signed)
Immediate Anesthesia Transfer of Care Note  Patient: Kristine Macias  Procedure(s) Performed: Procedure(s): PERCUTANEOUS ENDOSCOPIC GASTROSTOMY (PEG) PLACEMENT (N/A) ESOPHAGOGASTRODUODENOSCOPY (EGD) (N/A)  Patient Location: PACU and Endoscopy Unit  Anesthesia Type:General  Level of Consciousness: awake, alert  and oriented  Airway & Oxygen Therapy: Patient Spontanous Breathing and Patient connected to nasal cannula oxygen  Post-op Assessment: Report given to RN and Post -op Vital signs reviewed and stable  Post vital signs: Reviewed and stable  Last Vitals:  Filed Vitals:   01/11/15 0525  BP: 105/57  Pulse: 57  Temp: 37 C  Resp: 18    Complications: No apparent anesthesia complications and Patient re-intubated

## 2015-01-11 NOTE — Interval H&P Note (Signed)
History and Physical Interval Note:  01/11/2015 12:53 PM  Duane Boston  has presented today for surgery, with the diagnosis of malnutrition  The various methods of treatment have been discussed with the patient and family. After consideration of risks, benefits and other options for treatment, the patient has consented to  Procedure(s): PERCUTANEOUS ENDOSCOPIC GASTROSTOMY (PEG) PLACEMENT (N/A) ESOPHAGOGASTRODUODENOSCOPY (EGD) (N/A) as a surgical intervention .  The patient's history has been reviewed, patient examined, no change in status, stable for surgery.  I have reviewed the patient's chart and labs.  Questions were answered to the patient's satisfaction.   She understands that the feeding tube cannot come out for one month. She also understands there will be a 1 inch scar when the tube is eventually removed.  She understands the underlying psychaitric problems  needs to be addressed at that this will buy her time to allow that to happen.         REIN, MATTHEW GORDON

## 2015-01-11 NOTE — Care Management (Signed)
Met with patient prior to PEG placement; dietary pending. Referral made to Julienne Kass with [facility] health RN after meeting with patient to discuss home health. Hopeful that DR. Rayann Heman will follow for home health until patient is established with PCP at [facility] in [place] on 01/17/15 at 1:30PM. Will with [facility] notified to monitor PEG feeds per dietary consult.

## 2015-01-11 NOTE — Progress Notes (Addendum)
Nutrition Follow-up  DOCUMENTATION CODES:  Severe malnutrition in context of acute illness/injury  INTERVENTION: EN: RD consulted for enteral nutrition management. RD notes in Procedure recommendations s/p PEG placement, PEG ok to use for feedings tomorrow.  Will recommend Jevity 1.5 bolus feedings, with a goal of 5 cans per day to provide 1776kcals and 76g protein (this will meet 100% of estimated energy needs and 107% of estimated protein needs using IBW).  Will recommend free water flushes of 36m before and after each feeding and an additional 100-1548mBID once established. Will start TF at low rate s/p am lab draws tomorrow and any subsequent electrolyte replenishment as needed. Monitor sodium, magnesium, potassium, and phosphorus daily for at least 3 days, MD to replete as needed, as pt is at risk for refeeding syndrome given poor po intake, weight loss, and physical assessment. Will continue to follow.  Boost Breeze ordered on CL trays  NUTRITION DIAGNOSIS:  Inadequate oral intake related to acute illness, inability to eat as evidenced by energy intake < or equal to 50% for > or equal to 1 month, severe depletion of body fat, severe depletion of muscle mass, being addressed with placement of PEG tube  GOAL:  Patient will meet greater than or equal to 90% of their needs, Weight gain  MONITOR:  Energy Intake Digestive system Electrolyte and renal Profile Glucose Profile Anthropometrics  REASON FOR ASSESSMENT:  Consult Enteral/tube feeding initiation and management  ASSESSMENT:  Pt s/p PEG placement today. Per GI MD tube ok to use for feedings tomorrow. PO Intake: pt NPO this am for PEG placement  Medications: reviewed Labs: Electrolyte and Renal Profile:    Recent Labs Lab 01/06/15 0613  BUN <5*  CREATININE 0.45  NA 136  K 3.8   Glucose Profile: No results for input(s): GLUCAP in the last 72 hours. CBC Latest Ref Rng 01/11/2015 01/10/2015 01/06/2015  WBC 3.6 -  11.0 K/uL - - 5.3  Hemoglobin 12.0 - 16.0 g/dL - 10.0(L) 9.9(L)  Hematocrit 35.0 - 47.0 % - - 29.9(L)  Platelets 150 - 440 K/uL 161 - 96(L)   Height:  Ht Readings from Last 1 Encounters:  01/11/15 5' 6"  (1.676 m)    Weight:  Wt Readings from Last 1 Encounters:  01/11/15 88 lb (39.917 kg)    Ideal Body Weight:  59 kg  Filed Weights   01/04/15 1134 01/04/15 2106 01/11/15 1220  Weight: 83 lb (37.649 kg) 90 lb 4.8 oz (40.96 kg) 88 lb (39.917 kg)    BMI:  Body mass index is 14.21 kg/(m^2).  Estimated Nutritional Needs:  Kcal:  1732-2078kcals, BEE: 1332kcals, (IF 1.0-1.2)(AF 1.3) using IBW of 59kg  Protein:  59-71g protein (1.0-1.2g/kg) using IBW of 59kg  Fluid:  1475-177067mf fluid (25-85m4m) using IBW of 59kg  Skin:  Reviewed, no issues  Diet Order:  Diet clear liquid Room service appropriate?: Yes; Fluid consistency:: Thin  EDUCATION NEEDS:  Education needs addressed on last visit. Pt will need further instruction on PEG tube feedings before discharge.   Intake/Output Summary (Last 24 hours) at 01/11/15 1533 Last data filed at 01/11/15 1333  Gross per 24 hour  Intake    300 ml  Output      1 ml  Net    299 ml    Last BM:  5/17  HIGH Care Level  AllyDwyane Luo, LDN Pager [phone]

## 2015-01-12 LAB — BASIC METABOLIC PANEL
Anion gap: 7 (ref 5–15)
BUN: 9 mg/dL (ref 6–20)
CHLORIDE: 104 mmol/L (ref 101–111)
CO2: 28 mmol/L (ref 22–32)
CREATININE: 0.4 mg/dL — AB (ref 0.44–1.00)
Calcium: 8.4 mg/dL — ABNORMAL LOW (ref 8.9–10.3)
GFR calc non Af Amer: >60 mL/min (ref >60)
Glucose, Bld: 68 mg/dL (ref 65–99)
POTASSIUM: 3.3 mmol/L — AB (ref 3.5–5.1)
Sodium: 139 mmol/L (ref 135–145)

## 2015-01-12 LAB — PHOSPHORUS: Phosphorus: 4.3 mg/dL (ref 2.5–4.6)

## 2015-01-12 LAB — MAGNESIUM: MAGNESIUM: 1.8 mg/dL (ref 1.7–2.4)

## 2015-01-12 MED ORDER — FREE WATER
100.0000 mL | Freq: Two times a day (BID) | Status: DC
  Administered 2015-01-12 – 2015-01-13 (×3): 100 mL

## 2015-01-12 MED ORDER — JEVITY 1.5 CAL/FIBER PO LIQD
120.0000 mL | Freq: Every day | ORAL | Status: DC
  Administered 2015-01-12 – 2015-01-13 (×5): 120 mL

## 2015-01-12 MED ORDER — JEVITY 1.2 CAL PO LIQD: 1000.0000 mL | ORAL | Status: DC

## 2015-01-12 MED ORDER — POTASSIUM CHLORIDE CRYS ER 20 MEQ PO TBCR
40.0000 meq | EXTENDED_RELEASE_TABLET | Freq: Once | ORAL | Status: AC
  Administered 2015-01-12: 10:00:00 40 meq via ORAL
  Filled 2015-01-12: qty 2

## 2015-01-12 NOTE — Progress Notes (Signed)
[place] [facility] [place] NAME: Kristine Macias    MR#:  [id number]  DATE OF BIRTH:  1986-08-21  SUBJECTIVE:  S/P PEG tube placement today.   REVIEW OF SYSTEMS:  Review of Systems  Constitutional: Negative for fever, chills and weight loss.  HENT: Negative for nosebleeds and sore throat.   Eyes: Negative for blurred vision.  Respiratory: Negative for cough, shortness of breath and wheezing.   Cardiovascular: Negative for chest pain, orthopnea, leg swelling and PND.  Gastrointestinal: Negative for heartburn, nausea, vomiting, abdominal pain, diarrhea and constipation.  Genitourinary: Negative for dysuria and urgency.  Musculoskeletal: Negative for back pain.  Skin: Negative for rash.  Neurological: Negative for dizziness, speech change, focal weakness and headaches.  Endo/Heme/Allergies: Does not bruise/bleed easily.  Psychiatric/Behavioral: Negative for depression. The patient is nervous/anxious.     DRUG ALLERGIES:   Allergies  Allergen Reactions  . Sulfa Antibiotics Anaphylaxis  . Tetracyclines & Related Anaphylaxis  . Latex Hives and Itching    VITALS:  Blood pressure 108/65, pulse 72, temperature 98.4 F (36.9 C), temperature source Oral, resp. rate 18, height 5' 6"  (1.676 m), weight 39.917 kg (88 lb), last menstrual period 11/24/2014, SpO2 100 %.  PHYSICAL EXAMINATION:  GENERAL:  29 y.o.-year-old patient lying in the bed with no acute distress.  EYES: Pupils equal, round, reactive to light and accommodation. No scleral icterus. Extraocular muscles intact.  HEENT: Head atraumatic, normocephalic. Oropharynx and nasopharynx clear.  NECK:  Supple, no jugular venous distention. No thyroid enlargement, no tenderness.  LUNGS: Normal breath sounds bilaterally, no wheezing, rales,rhonchi or crepitation. No use of accessory muscles of respiration.  CARDIOVASCULAR: S1, S2 normal. No murmurs, rubs, or gallops.  ABDOMEN: Soft,  nontender, nondistended. Bowel sounds present. No organomegaly or mass.  EXTREMITIES: No pedal edema, cyanosis, or clubbing.  NEUROLOGIC: Cranial nerves II through XII are intact. Muscle strength 5/5 in all extremities. Sensation intact. Gait not checked.  PSYCHIATRIC: sleepy, anxious SKIN: No obvious rash, lesion, or ulcer.    LABORATORY PANEL:   CBC  Recent Labs Lab 01/06/15 0613 01/10/15 0449 01/11/15 0543  WBC 5.3  --   --   HGB 9.9* 10.0*  --   HCT 29.9*  --   --   PLT 96*  --  161   ------------------------------------------------------------------------------------------------------------------  Chemistries   Recent Labs Lab 01/10/15 0449 01/12/15 0533  NA  --  139  K  --  3.3*  CL  --  104  CO2  --  28  GLUCOSE  --  68  BUN  --  9  CREATININE  --  0.40*  CALCIUM  --  8.4*  MG  --  1.8  AST 101*  --   ALT 67*  --   ALKPHOS 87  --   BILITOT 0.6  --    ------------------------------------------------------------------------------------------------------------------   ASSESSMENT AND PLAN:   29 year old femlae with past medical history of generalized anxiety disorder, severe alcohol dependence history of alcohol-induced hepatitis comes into the emergency room with  1.Generalized abdominal pain with nausea and vomiting and significant weight loss of 30 pounds in 1-1/2 months. PEG tube placement today  2. Abnormal CT scan showing pancolitis and enlarged CBD: patient underwent c/s further workup per GI.  3. 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 Generalized anxiety: on remeron, will increase the dose to 30 mg per psych  #5 DVT prophylaxis - pt is ambulatory ,  D/c subcutaneous heparin , she is refusing   #6 diarrhea, continue Creon, following, could be pancreatic insufficiency    All the records are reviewed and case discussed with Care Management/Social Workerr. Management plans  discussed with the patient, family and they are in agreement.  CODE STATUS: Full   TOTAL TIME TAKING CARE OF THIS PATIENT: 35 minutes.   POSSIBLE D/C IN am. DEPENDING ON CLINICAL CONDITION. Will need to make sure she is able to tolerate tube feedings. Will c/s dietitian   [facility] M.D on 01/12/2015 at 2:43 PM  Between 7am to 6pm - Pager - [phone]  After 6pm go to [email or web address] - password [email or web address] [facility] [facility]  Office  [phone]  CC: Primary care physician; PROVIDER NOT IN SYSTEM

## 2015-01-12 NOTE — Progress Notes (Signed)
GI Inpatient Follow-up Note  Patient Identification: Alyric Parkin is a 29 y.o. female with severe malnutrition, wt loss, severe anxiety  Subjective:  Tolerated tube feeds this am.  Still can't tolerate PO, makes her nauseas.  Mild pain to L of tube site. No diarrhea.   Scheduled Inpatient Medications:  . bacitracin  1 application Topical BID  . feeding supplement (JEVITY 1.5 CAL/FIBER)  120 mL Per Tube 5 X Daily  . feeding supplement (RESOURCE BREEZE)  1 Container Oral TID WC  . folic acid  1 mg Oral Daily  . free water  100 mL Per Tube BID  . heparin  5,000 Units Subcutaneous 3 times per day  . lipase/protease/amylase  36,000 Units Oral TID AC  . mirtazapine  30 mg Oral QHS  . multivitamin with minerals  1 tablet Oral Daily  . thiamine  100 mg Oral Daily   Or  . thiamine  100 mg Intravenous Daily    Continuous Inpatient Infusions:     PRN Inpatient Medications:  acetaminophen **OR** acetaminophen, diphenhydrAMINE, ondansetron **OR** ondansetron (ZOFRAN) IV, sodium chloride     Physical Examination: BP 108/65 mmHg  Pulse 72  Temp(Src) 98.4 F (36.9 C) (Oral)  Resp 18  Ht 5' 6"  (1.676 m)  Wt 88 lb (39.917 kg)  BMI 14.21 kg/m2  SpO2 100%  LMP 11/24/2014 Gen: NAD, alert and oriented x 4, malnourished.  HEENT: PEERLA, EOMI, Neck: supple, no JVD or thyromegaly Chest: CTA bilaterally, no wheezes, crackles, or other adventitious sounds CV: RRR, no m/g/c/r Abd: tube at 2.5 cm, loosely touching skin, old blood, abd is very soft, mild TTP Ext: no edema, well perfused with 2+ pulses, Skin: no rash or lesions noted Lymph: no LAD  Data: Lab Results  Component Value Date   WBC 5.3 01/06/2015   HGB 10.0* 01/10/2015   HCT 29.9* 01/06/2015   MCV 103.0* 01/06/2015   PLT 161 01/11/2015    Recent Labs Lab 01/06/15 0613 01/10/15 0449  HGB 9.9* 10.0*   Lab Results  Component Value Date   NA 139 01/12/2015   K 3.3* 01/12/2015   CL 104 01/12/2015   CO2 28  01/12/2015   BUN 9 01/12/2015   CREATININE 0.40* 01/12/2015   Lab Results  Component Value Date   ALT 67* 01/10/2015   AST 101* 01/10/2015   ALKPHOS 87 01/10/2015   BILITOT 0.6 01/10/2015    Recent Labs Lab 01/06/15 1527  INR 1.06   Assessment/Plan: Ms. Ranum is a 29 y.o. female witih severe malnutrition BMI 14 due to severe anxiety, heavy ETOH, death in family. .   S/p PEG placement.  BIopsies from duuodenum and t ileum show chrojnic inflammation.  If this is Crohn's disease, it is very mild. CT with no small bowel findings.   Recommendations: - very slow re-introduction of nutrition / tube feeds to avoid re-feeding syndrome - Phos, Mg, K labs in 1 week.   - GI clinic f/u in 2 weeks.   F/u w/u of Crohn's at that time long with close monitoring for re-feeding syndrome - psychiatry follow up is very important.     Please call with questions or concerns.  REIN, Grace Blight, MD

## 2015-01-12 NOTE — Progress Notes (Signed)
Nutrition Follow-up  DOCUMENTATION CODES:  Severe malnutrition in context of acute illness/injury  INTERVENTION: EN: RD spoke with MD Manuella Ghazi this am. Will start Jevity 1.5 bolus feedings, with a goal of 5 cans per day to provide 1776kcals and 76g protein (this will meet 100% of estimated energy needs and 107% of estimated protein needs using IBW). Will recommend free water flushes of 55m before and after each feeding and an additional 100-1579mBID once established. Will start TF at half a can today. MD, ShManuella Ghazigreeable to recheck labs in the am as pt at risk for refeeding syndrome. Will continue to follow.  RD notes potassium supplementation this am. MD aware that recommendations for TF given to Case Management, AnLevada Dyor d/c.  Boost Breeze TID  NUTRITION DIAGNOSIS:  Inadequate oral intake related to acute illness, inability to eat as evidenced by energy intake < or equal to 50% for > or equal to 1 month, severe depletion of body fat, severe depletion of muscle mass  GOAL:  Patient will meet greater than or equal to 90% of their needs, Weight gain; addressing with TF initiation  MONITOR:  Energy Intake Digestive system Electrolyte and renal Profile Glucose Profile Anthropometrics  REASON FOR ASSESSMENT:  Consult Enteral/tube feeding initiation and management  ASSESSMENT:  Pt s/p PEG yesterday; TF initiation today. Medications: reviewed  Labs: Electrolyte and Renal Profile:    Recent Labs Lab 01/06/15 0613 01/12/15 0533  BUN <5* 9  CREATININE 0.45 0.40*  NA 136 139  K 3.8 3.3*  MG  --  1.8  PHOS  --  4.3   Glucose Profile: serum glucose WDL Height:  Ht Readings from Last 1 Encounters:  01/11/15 5' 6"  (1.676 m)    Weight:  Wt Readings from Last 1 Encounters:  01/11/15 88 lb (39.917 kg)    Ideal Body Weight:  59 kg  Wt Readings from Last 10 Encounters:  01/11/15 88 lb (39.917 kg)  04/24/12 99 lb 3.2 oz (44.997 kg)  04/10/12 99 lb 4.8 oz (45.042 kg)   08/31/11 94 lb 6.4 oz (42.82 kg)    BMI:  Body mass index is 14.21 kg/(m^2).  Estimated Nutritional Needs:  Kcal:  1732-2078kcals, BEE: 1332kcals, (IF 1.0-1.2)(AF 1.3) using IBW of 59kg  Protein:  59-71g protein (1.0-1.2g/kg) using IBW of 59kg  Fluid:  1475-177081mf fluid (25-54m83m) using IBW of 59kg  Skin:  Reviewed, no issues  Diet Order:  Diet clear liquid Room service appropriate?: Yes; Fluid consistency:: Thin  EDUCATION NEEDS:  Education needs addressed   Intake/Output Summary (Last 24 hours) at 01/12/15 0925 Last data filed at 01/12/15 0100  Gross per 24 hour  Intake    300 ml  Output    550 ml  Net   -250 ml    Last BM:  5/17  HIGH Care Level  AllyDwyane Luo, LDN Pager [phone]

## 2015-01-12 NOTE — Plan of Care (Signed)
Problem: Discharge Progression Outcomes Goal: Discharge plan in place and appropriate Individualization of Care  Outcome: Progressing 1. Lives at home with mother and her 2 children. 2. Admits to ETOH abuse and verbalizes understanding she is in withdrawal. 3. Reports has lost 13 lbs in past month;only able to tolerate full liquid diet. Goal: Other Discharge Outcomes/Goals Outcome: Progressing Plan of care progress to goal for: Hemodynamically-VSS Complications-prn meds given for insomnia with relief, prn meds given for small amount of nausea with improvement Diet-pt tolerating clear liquids Activity-pt up to bathroom with assistance

## 2015-01-12 NOTE — Plan of Care (Signed)
Problem: Discharge Progression Outcomes Goal: Discharge plan in place and appropriate Individualization of Care  Outcome: Progressing Patient may be discharging tomorrow, tolerating PEG feedings well through new PEG tube. Dressing changed around PED tube site free of signs of infection.  Will discharge once labs are checked and determined she is tolerating feeds well.  She will be set up with home health as well.

## 2015-01-13 ENCOUNTER — Encounter (HOSPITAL_COMMUNITY): Payer: Self-pay | Admitting: Emergency Medicine

## 2015-01-13 ENCOUNTER — Emergency Department (HOSPITAL_COMMUNITY)
Admission: EM | Admit: 2015-01-13 | Discharge: 2015-01-14 | Disposition: A | Discharge status: Home / Self Care | Payer: Medicaid Other | Attending: Emergency Medicine | Admitting: Emergency Medicine

## 2015-01-13 ENCOUNTER — Emergency Department (HOSPITAL_COMMUNITY): Payer: Medicaid Other

## 2015-01-13 DIAGNOSIS — M6281 Muscle weakness (generalized): Secondary | ICD-10-CM | POA: Insufficient documentation

## 2015-01-13 DIAGNOSIS — F419 Anxiety disorder, unspecified: Secondary | ICD-10-CM | POA: Insufficient documentation

## 2015-01-13 DIAGNOSIS — Z91040 Latex allergy status: Secondary | ICD-10-CM | POA: Insufficient documentation

## 2015-01-13 DIAGNOSIS — F1010 Alcohol abuse, uncomplicated: Secondary | ICD-10-CM | POA: Insufficient documentation

## 2015-01-13 DIAGNOSIS — Z8679 Personal history of other diseases of the circulatory system: Secondary | ICD-10-CM | POA: Insufficient documentation

## 2015-01-13 DIAGNOSIS — Z79899 Other long term (current) drug therapy: Secondary | ICD-10-CM | POA: Insufficient documentation

## 2015-01-13 DIAGNOSIS — F1310 Sedative, hypnotic or anxiolytic abuse, uncomplicated: Secondary | ICD-10-CM | POA: Insufficient documentation

## 2015-01-13 DIAGNOSIS — Z87448 Personal history of other diseases of urinary system: Secondary | ICD-10-CM | POA: Insufficient documentation

## 2015-01-13 DIAGNOSIS — R569 Unspecified convulsions: Secondary | ICD-10-CM | POA: Diagnosis not present

## 2015-01-13 DIAGNOSIS — Z8619 Personal history of other infectious and parasitic diseases: Secondary | ICD-10-CM | POA: Insufficient documentation

## 2015-01-13 DIAGNOSIS — Z8709 Personal history of other diseases of the respiratory system: Secondary | ICD-10-CM | POA: Insufficient documentation

## 2015-01-13 DIAGNOSIS — Z3202 Encounter for pregnancy test, result negative: Secondary | ICD-10-CM | POA: Insufficient documentation

## 2015-01-13 DIAGNOSIS — R55 Syncope and collapse: Secondary | ICD-10-CM | POA: Insufficient documentation

## 2015-01-13 DIAGNOSIS — Z720 Tobacco use: Secondary | ICD-10-CM | POA: Insufficient documentation

## 2015-01-13 DIAGNOSIS — E46 Unspecified protein-calorie malnutrition: Secondary | ICD-10-CM | POA: Insufficient documentation

## 2015-01-13 LAB — MAGNESIUM: Magnesium: 1.9 mg/dL (ref 1.7–2.4)

## 2015-01-13 LAB — GLUCOSE, CAPILLARY
GLUCOSE-CAPILLARY: 106 mg/dL — AB (ref 65–99)
GLUCOSE-CAPILLARY: 185 mg/dL — AB (ref 65–99)
Glucose-Capillary: 84 mg/dL (ref 65–99)
Glucose-Capillary: 125 mg/dL — ABNORMAL HIGH (ref 65–99)

## 2015-01-13 LAB — BASIC METABOLIC PANEL
ANION GAP: 12 (ref 5–15)
BUN: 5 mg/dL — ABNORMAL LOW (ref 6–20)
CO2: 25 mmol/L (ref 22–32)
Calcium: 9.6 mg/dL (ref 8.9–10.3)
Chloride: 98 mmol/L — ABNORMAL LOW (ref 101–111)
Creatinine, Ser: 0.39 mg/dL — ABNORMAL LOW (ref 0.44–1.00)
GFR calc Af Amer: >60 mL/min (ref >60)
Glucose, Bld: 112 mg/dL — ABNORMAL HIGH (ref 65–99)
Potassium: 3.8 mmol/L (ref 3.5–5.1)
SODIUM: 135 mmol/L (ref 135–145)

## 2015-01-13 LAB — CBC
HEMATOCRIT: 36.9 % (ref 36.0–46.0)
Hemoglobin: 12.5 g/dL (ref 12.0–15.0)
MCH: 35.1 pg — AB (ref 26.0–34.0)
MCHC: 33.9 g/dL (ref 30.0–36.0)
MCV: 103.7 fL — AB (ref 78.0–100.0)
Platelets: 264 10*3/uL (ref 150–400)
RBC: 3.56 MIL/uL — ABNORMAL LOW (ref 3.87–5.11)
RDW: 19.2 % — AB (ref 11.5–15.5)
WBC: 6.7 10*3/uL (ref 4.0–10.5)

## 2015-01-13 LAB — PHOSPHORUS: Phosphorus: 3.8 mg/dL (ref 2.5–4.6)

## 2015-01-13 LAB — ETHANOL: Alcohol, Ethyl (B): <5 mg/dL (ref <5)

## 2015-01-13 LAB — POTASSIUM: Potassium: 3.8 mmol/L (ref 3.5–5.1)

## 2015-01-13 LAB — CBG MONITORING, ED: Glucose-Capillary: 115 mg/dL — ABNORMAL HIGH (ref 65–99)

## 2015-01-13 LAB — I-STAT BETA HCG BLOOD, ED (MC, WL, AP ONLY): I-stat hCG, quantitative: <5 m[IU]/mL (ref <5)

## 2015-01-13 MED ORDER — JEVITY 1.5 CAL/FIBER PO LIQD: 237.0000 mL | Freq: Every day | ORAL | Status: DC

## 2015-01-13 MED ORDER — PANCRELIPASE (LIP-PROT-AMYL) 36000-114000 UNITS PO CPEP: 36000.0000 [IU] | ORAL_CAPSULE | Freq: Three times a day (TID) | ORAL | Status: DC

## 2015-01-13 MED ORDER — SODIUM CHLORIDE 0.9 % IV BOLUS (SEPSIS)
1000.0000 mL | Freq: Once | INTRAVENOUS | Status: AC
  Administered 2015-01-13: 1000 mL via INTRAVENOUS

## 2015-01-13 MED ORDER — KETOROLAC TROMETHAMINE 30 MG/ML IJ SOLN
30.0000 mg | Freq: Once | INTRAMUSCULAR | Status: AC
  Administered 2015-01-13: 30 mg via INTRAVENOUS
  Filled 2015-01-13: qty 1

## 2015-01-13 MED ORDER — JEVITY 1.5 CAL/FIBER PO LIQD
237.0000 mL | Freq: Every day | ORAL | Status: DC
  Administered 2015-01-13 (×2): 237 mL

## 2015-01-13 MED ORDER — MIRTAZAPINE 30 MG PO TABS: 30.0000 mg | ORAL_TABLET | Freq: Every day | ORAL | Status: DC

## 2015-01-13 MED ORDER — LORAZEPAM 2 MG/ML IJ SOLN
1.0000 mg | Freq: Once | INTRAMUSCULAR | Status: AC
  Administered 2015-01-13: 1 mg via INTRAVENOUS
  Filled 2015-01-13: qty 1

## 2015-01-13 NOTE — Clinical Social Work Note (Signed)
Clinical Social Work Assessment  Pt seen on 01/12/2015 at 3:30 PM  Patient Details  Name: Kristine Macias MRN: [id number] Date of Birth: [birth year]  Date of referral:  01/12/15               Reason for consult:  Mental Health Concerns                Permission sought to share information with:    Permission granted to share information::  No  Name::        Agency::     Relationship::     Contact Information:     Housing/Transportation Living arrangements for the past 2 months:  Single Family Home Source of Information:  Patient Patient Interpreter Needed:  None Criminal Activity/Legal Involvement Pertinent to Current Situation/Hospitalization:  No - Comment as needed Significant Relationships:  Parents, Other(Comment) Lives with:  Minor Children, Parents Do you feel safe going back to the place where you live?  Yes Need for family participation in patient care:  No (Coment)  Care giving concerns: Pt will discharge with home with health for management of PEG tube.   Social Worker assessment / plan:  CSW met with pt to address consult. CSW introduced herself and explained role of social work.  Pt lives at home with her mother and 2 children. Pt works full time from home. Pt's children are under the care of her mother while she was admitted. Pt shared her long history of dealing with anxiety. Pt has been in therapy in the past and it has worked out well. Pt has also been on medication. Pt reported that SSRIs do not work well for her. Pt reported that she does self medicate with ETOH to manage her anxiety. Pt has detoxed once before on the medical floor. During this time, she was drinking about 12 beers a day. Pt stated that she currently drinks 1-2 beers a day. Pt denies another drug use. Pt would like to have help with her management of ETOH. Pt would also like to establish with a therapist. Pt has Medicaid out of [facility]. Pt does drive, however prefers not to drive on the  highway.  CSW will refer pt to [facility] [place] in [place] as they accept Medicaid. This agency also has services to manage substance abuse as well as mental health issues.   CSW will continue to follow during admission.   Employment status:  Therapist, music:  Medicaid In [place] PT Recommendations:  Home with [place] / Referral to community resources:  Family [place]  Patient/Family's Response to care:  Pt is open to treatment options to manage her ETOH and anxiety.   Patient/Family's Understanding of and Emotional Response to Diagnosis, Current Treatment, and Prognosis:  Pt understands that she struggles with ETOH and needs treatment.   Emotional Assessment Appearance:  Appears stated age Attitude/Demeanor/Rapport:  Other (Pleasant) Affect (typically observed):  Adaptable, Accepting Orientation:  Oriented to Self, Oriented to Place, Oriented to  Time, Oriented to Situation Alcohol / Substance use:   Yes, ETOH. Denies any other substance use.  Psych involvement (Current and /or in the community):  Yes (Comment)  Discharge Needs  Concerns to be addressed:  Mental Health Concerns Readmission within the last 30 days:  No Current discharge risk:  Chronically ill Barriers to Discharge:  No Barriers Identified   Darden Dates, LCSW 01/13/2015, 11:07 AM

## 2015-01-13 NOTE — Progress Notes (Addendum)
Nutrition Follow-up  DOCUMENTATION CODES:  Severe malnutrition in context of acute illness/injury  INTERVENTION: EN: Recommend increasing Jevity 1.5 bolus feedings to goal of 5 whole cans per day to provide 1776kcals and 76g protein (this will meet 100% of estimated energy needs and 107% of estimated protein needs using IBW). Spoke with MD, Rayann Heman will increase slowly for risk of refeeding.  Spoke with pt recommend adding one full can to 1/2 can regimen daily so slowly progress to goal of 5 full cans per day over the next 5 days. Pt reports appointment with [facility] outpatient on Monday and will recheck electrolytes then.  Education: RD present with pt on 10am feed this am with RN Estill Bamberg. Pt tolerating very well.  RN educated pt on how to administer bolus feedings as pt performed feeding.  RD provided 'Tube Feeding at Home' booklet provided by [employer]. RD instructed pt on feedings, maintaining HOB>30degrees/sit up after feedings, signs and symptoms of dehydration, and other troubleshooting topics such as what to do if tube gets clogged.  Adherence is likely as pt did very well self-administering TF, and was very engaged in education and asking appropriate questions. Medical Food Supplement Therapy: will discontinue Boost Breeze as pt is not drinking. Meals and Snacks: Cater to patient preferences  NUTRITION DIAGNOSIS:  Inadequate oral intake related to acute illness, inability to eat as evidenced by energy intake < or equal to 50% for > or equal to 1 month, severe depletion of body fat, severe depletion of muscle mass; being addressed with nutrition support.  GOAL:  Patient will meet greater than or equal to 90% of their needs, Weight gain; ongoing, improving with nutrition support.  MONITOR:  Energy Intake Digestive system Electrolyte and renal Profile Glucose Profile Anthropometrics  REASON FOR ASSESSMENT:  Consult Enteral/tube feeding initiation and  management  ASSESSMENT:  Pt s/p PEG placement. Start tube feedings yesterday, tolerating well.  Likely d/c today per MD note. Medications: reviewed  Labs: Electrolyte and Renal Profile:    Recent Labs Lab 01/12/15 0533 01/13/15 0613  BUN 9  --   CREATININE 0.40*  --   NA 139  --   K 3.3* 3.8  MG 1.8 1.9  PHOS 4.3 3.8     Height:  Ht Readings from Last 1 Encounters:  01/11/15 5' 6"  (1.676 m)    Weight:  Wt Readings from Last 1 Encounters:  01/13/15 95 lb (43.092 kg)    Ideal Body Weight:  59 kg  Wt Readings from Last 10 Encounters:  01/13/15 95 lb (43.092 kg)  04/24/12 99 lb 3.2 oz (44.997 kg)  04/10/12 99 lb 4.8 oz (45.042 kg)  08/31/11 94 lb 6.4 oz (42.82 kg)    BMI:  Body mass index is 15.34 kg/(m^2).  Estimated Nutritional Needs:  Kcal:  1732-2078kcals, BEE: 1332kcals, (IF 1.0-1.2)(AF 1.3) using IBW of 59kg  Protein:  59-71g protein (1.0-1.2g/kg) using IBW of 59kg  Fluid:  1475-1752m of fluid (25-329mkg) using IBW of 59kg  Skin:  Reviewed, no issues  Diet Order:  Diet clear liquid Room service appropriate?: Yes; Fluid consistency:: Thin Diet - low sodium heart healthy  EDUCATION NEEDS:  Education needs addressed, see above.   Intake/Output Summary (Last 24 hours) at 01/13/15 1255 Last data filed at 01/13/15 1030  Gross per 24 hour  Intake   1217 ml  Output   1600 ml  Net   -383 ml    Last BM:  5/17   HIGH Care Level  Damyon Mullane  Melina Modena, RD, LDN Pager [phone]

## 2015-01-13 NOTE — Clinical Social Work Note (Signed)
Pt is ready for discharge today and will return home with home health services. CSW met with pt to provide resources. Pt will make an appt for her follow up for counseling at [facility]. CSW is signing off as no further needs identified.   Darden Dates, MSW, LCSW Clinical Social Worker  [phone]

## 2015-01-13 NOTE — Plan of Care (Signed)
Problem: Discharge Progression Outcomes Goal: Discharge plan in place and appropriate Individualization of Care  Outcome: Progressing 1. Lives at home with mother and her 2 children. 2. Admits to ETOH abuse and verbalizes understanding she is in withdrawal. 3. Reports has lost 13 lbs in past month;only able to tolerate full liquid diet. Goal: Other Discharge Outcomes/Goals Outcome: Progressing Plan of care progress to goal for: Pain-no c/o pain this shift Hemodynamically-VSS Complications-no c/o this shift Diet-pt tolerating peg tube feedings well Activity-pt up to [facility]

## 2015-01-13 NOTE — ED Provider Notes (Signed)
CSN: [id number]     Arrival date & time 01/13/15  2043 History   First MD Initiated Contact with Patient 01/13/15 2113     Chief Complaint  Patient presents with  . Seizures     (Consider location/radiation/quality/duration/timing/severity/associated sxs/prior Treatment) HPI  Pt is a 29yo female with hx of anxiety, renal disorder, irregular heart beat related to weight/diet in 2010, and alcohol abuse, presenting to ED from home via EMS with reports of grand mal seizure that lasted 2-3 minutes.  Pt was discharged earlier today from [facility] where she was seen for malnourishment and EtoH. Pt had a J tube placed during this visit, was discharged home one a clear liquid diet. Pt states she got up to walk to her mother, next thing she remember is EMS arriving. Pt states mother advised she just collapsed to the floor.  No prior hx of seizures. Pt states last drink of EtoH was 2 weeks ago but also reports being on ativan twice daily but discharged home today w/o the ativan as it was not given for 2 days in the hospital once J-tube was placed. Pt denies any pain, numbness or tingling but does report generalized weakness. Pt states she has not been able to sleep well at night due to worsening anxiety which is what she believes caused her malnutrition. Denies SI/HI.    Past Medical History  Diagnosis Date  . Anxiety   . Chlamydia 2007  . Irregular heart beat 2010    wt/diet related after evaluation  . Renal disorder   . Pneumothorax, spontaneous, tension   . Alcohol abuse    Past Surgical History  Procedure Laterality Date  . Cesarean section    . Chest tube insertion    . Esophagogastroduodenoscopy N/A 01/06/2015    Procedure: ESOPHAGOGASTRODUODENOSCOPY (EGD);  Surgeon: Josefine Class, MD;  Location[facility] ENDOSCOPY;  Service: Endoscopy;  Laterality: N/A;  . Colonoscopy with propofol N/A 01/06/2015    Procedure: COLONOSCOPY WITH PROPOFOL;  Surgeon: Josefine Class, MD;  Location:  [facility] ENDOSCOPY;  Service: Endoscopy;  Laterality: N/A;   Family History  Problem Relation Age of Onset  . Anesthesia problems Neg Hx   . Thyroid disease Mother   . Cancer Father   . Stroke Other    History  Substance Use Topics  . Smoking status: Current Every Day Smoker -- 0.25 packs/day    Types: Cigarettes  . Smokeless tobacco: Never Used  . Alcohol Use: 21.6 oz/week    36 Cans of beer per week   OB History    Gravida Para Term Preterm AB TAB SAB Ectopic Multiple Living   4 2 2  0 2 1 1  0 0 2     Review of Systems  Constitutional: Negative for chills.  Eyes: Negative for photophobia.  Respiratory: Negative for cough and shortness of breath.   Cardiovascular: Negative for chest pain, palpitations and leg swelling.  Gastrointestinal: Negative for nausea, vomiting, abdominal pain and diarrhea.  Neurological: Positive for seizures, syncope, weakness ( generalized) and light-headedness. Negative for dizziness, speech difficulty, numbness and headaches.  Psychiatric/Behavioral: Positive for sleep disturbance. Negative for suicidal ideas and self-injury. The patient is nervous/anxious.   All other systems reviewed and are negative.     Allergies  Effexor; Sulfa antibiotics; Tetracyclines & related; Trazodone and nefazodone; Latex; Paxil; Seroquel; and Zoloft  Home Medications   Prior to Admission medications   Medication Sig Start Date End Date Taking? Authorizing Provider  folic acid (FOLVITE) 1 MG  tablet Take 1 mg by mouth daily.   Yes Historical Provider, MD  lipase/protease/amylase (CREON) 36000 UNITS CPEP capsule Take 1 capsule (36,000 Units total) by mouth 3 (three) times daily before meals. 01/13/15  Yes Max Sane, MD  Multiple Vitamin (MULTIVITAMIN WITH MINERALS) TABS tablet Take 1 tablet by mouth daily.   Yes Historical Provider, MD  Nutritional Supplements (FEEDING SUPPLEMENT, JEVITY 1.5 CAL/FIBER,) LIQD Place 237 mLs into feeding tube 5 (five) times daily. 01/13/15  02/24/15 Yes Vipul Manuella Ghazi, MD  thiamine 50 MG tablet Take 50 mg by mouth daily.   Yes Historical Provider, MD  gabapentin (NEURONTIN) 300 MG capsule Take 1 capsule (300 mg total) by mouth 3 (three) times daily. Patient not taking: Reported on 01/04/2015 11/26/14   Clarene Reamer, MD  HYDROcodone-acetaminophen [facility]) 5-325 MG per tablet Take 1-2 tablets by mouth every 4 (four) hours as needed for pain. Patient not taking: Reported on 11/24/2014 11/24/12   Hazel Sams, PA-C  LORazepam (ATIVAN) 1 MG tablet Take 1 tablet (1 mg total) by mouth 2 (two) times daily as needed for anxiety. 01/14/15   Noland Fordyce, PA-C  mirtazapine (REMERON) 30 MG tablet Take 1 tablet (30 mg total) by mouth at bedtime. 01/13/15   Vipul Manuella Ghazi, MD   BP 119/81 mmHg  Pulse 95  Temp(Src) 98.3 F (36.8 C) (Oral)  Resp 14  Ht 5' 6"  (1.676 m)  SpO2 100%  LMP 11/24/2014 (LMP Unknown) Physical Exam  Constitutional: She appears cachectic. No distress.  Young chronically ill appearing female lying in exam bed, tearful  HENT:  Head: Normocephalic.  Small abrasion to lower lip. Bleeding controlled.   Eyes: Conjunctivae and EOM are normal. Pupils are equal, round, and reactive to light. No scleral icterus.  Neck: Normal range of motion. Neck supple.  Cardiovascular: Normal rate, regular rhythm and normal heart sounds.   Pulmonary/Chest: Effort normal and breath sounds normal. No respiratory distress. She has no wheezes. She has no rales. She exhibits no tenderness.  Abdominal: Soft. Bowel sounds are normal. She exhibits no distension and no mass. There is no tenderness. There is no rebound and no guarding.  Musculoskeletal: Normal range of motion.  Neurological: She is alert.  Skin: Skin is warm and dry. She is not diaphoretic.  Psychiatric: Her speech is normal and behavior is normal. Her mood appears anxious. She exhibits a depressed mood. She expresses no homicidal and no suicidal ideation.  tearful  Nursing note and vitals  reviewed.   ED Course  Procedures (including critical care time) Labs Review Labs Reviewed  BASIC METABOLIC PANEL - Abnormal; Notable for the following:    Chloride 98 (*)    Glucose, Bld 112 (*)    BUN 5 (*)    Creatinine, Ser 0.39 (*)    All other components within normal limits  CBC - Abnormal; Notable for the following:    RBC 3.56 (*)    MCV 103.7 (*)    MCH 35.1 (*)    RDW 19.2 (*)    All other components within normal limits  URINE RAPID DRUG SCREEN (HOSP PERFORMED) - Abnormal; Notable for the following:    Benzodiazepines POSITIVE (*)    All other components within normal limits  CBG MONITORING, ED - Abnormal; Notable for the following:    Glucose-Capillary 115 (*)    All other components within normal limits  ETHANOL  I-STAT BETA HCG BLOOD, ED ([facility], [facility], [facility] ONLY)    Imaging Review Ct Head Wo Contrast   (  if New Onset Seizure And/or Head Trauma)  01/13/2015   CLINICAL DATA:  [place] and had a seizure in a kitchen, headache, seeing spots, smoker, initial encounter  EXAM: CT HEAD WITHOUT CONTRAST  TECHNIQUE: Contiguous axial images were obtained from the base of the skull through the vertex without intravenous contrast.  COMPARISON:  11/23/2012  FINDINGS: Normal ventricular morphology.  No midline shift or mass effect.  Normal appearance of brain parenchyma.  No intracranial hemorrhage, mass lesion, or acute infarction.  Visualized paranasal sinuses and mastoid air cells clear.  Bones unremarkable.  IMPRESSION: Normal exam.   Electronically Signed   By: Lavonia Dana M.D.   On: 01/13/2015 22:20     EKG Interpretation   Date/Time:  Thursday Jan 13 2015 21:20:44 EDT Ventricular Rate:  77 PR Interval:  118 QRS Duration: 86 QT Interval:  384 QTC Calculation: 434 R Axis:   90 Text Interpretation:  Normal sinus rhythm Rightward axis No significant  change since last tracing Confirmed by Maryan Rued  MD, Loree Fee ([id number]) on  01/13/2015 10:44:46 PM      MDM   Final diagnoses:   Seizure  Alcohol abuse  Anxiety  Malnutrition    Pt is a 29yo female with hx of alcohol abuse and severe protein malnutrition, discharged today from [facility], presenting to ED from home via EMS with reports of seizure. No prior hx of seizures. Pt does report being given ativan BID for several days but discharged home w/o any ativan. States last drink of alcohol was 2 weeks ago.  Pt denies SI/HI.  Vitals: WNL  CT head: normal exam. Labs: Na and K: WNL, Cr: unremarkable.    Pt given 1L IV fluids, toradol for mild frontal headache, and ativan as seizure may have been due to sudden withdrawal from ativan.   Pt is able to ambulate in halls w/o difficulty and is staying with family. Discussed pt with Dr. Maryan Rued, agrees pt may be discharged home. Will give short course of ativan, advised pt to f/u with [facility] tomorrow and f/u with PCP as previously scheduled for Monday. Pt declined to speak with [facility] as she has already spoken with them at [facility] and denies SI/HI.  Pt anxious to go home as she states she has not been home in over 1 month.   Will discharge pt home with strict return precautions. Pt and boyfriend verbalized understanding and agreement with tx plan.       Noland Fordyce, PA-C 01/14/15 0020  Blanchie Dessert, MD 01/14/15 1539

## 2015-01-13 NOTE — Progress Notes (Addendum)
Pt being discharged home with home health today, tolerates tube feeding, PEG flushing without difficulty, pt education and demonstration performed on tube feeding and administration of feeds, pt able to perform without difficulty, pt with no complaints of pain, no distress or discomfort noted, dietitian consulted with pt today, pt up ambulating in room and in hallway around nurses station

## 2015-01-13 NOTE — Discharge Summary (Signed)
[facility] [place] NAME: Kristine Macias    MR#:  [id number]  DATE OF BIRTH:  09-26-1985  DATE OF ADMISSION:  01/04/2015 ADMITTING PHYSICIAN: Fritzi Mandes, MD  DATE OF DISCHARGE: No discharge date for patient encounter.  PRIMARY CARE PHYSICIAN: PROVIDER NOT IN SYSTEM    ADMISSION DIAGNOSIS:  Colitis [K52.9] Weight loss [R63.4] Transaminitis [R74.0] Nausea and vomiting, vomiting of unspecified type [R11.2]  DISCHARGE DIAGNOSIS:  Principal Problem:   Pancolitis Active Problems:   Protein-calorie malnutrition, severe   Alcohol abuse   SECONDARY DIAGNOSIS:   Past Medical History  Diagnosis Date  . Anxiety   . Chlamydia 2007  . Irregular heart beat 2010    wt/diet related after evaluation  . Renal disorder   . Pneumothorax, spontaneous, tension   . Alcohol abuse     HOSPITAL COURSE:   29 year old femlae with past medical history of generalized anxiety disorder, severe alcohol dependence history of alcohol-induced hepatitis admitted for Generalized abdominal pain with nausea and vomiting and significant weight loss of 30 pounds in 1-1/2 months. After long discussion with patient who choose to undergo PEG tube placement due to ongoing long-term malnutrition. PEG tube placed by GI and patient was started on tube feeds which she has been tolerating it fine. Patient was also evaluated by psychiatry Dr. Jenny Reichmann Recommended Remeron which patient did not want to take it and was Requesting benzodiazepine which psychiatry did not agree. Patient remained very clear on what she would want to use and what not and she chose not to take Remeron for pancreatic enzymes and wanting to go home only at this point.  She is in agreement with discharge plans.  CONSULTS OBTAINED:  Treatment Team:  Josefine Class, MD Gonzella Lex, MD Manya Silvas, MD Max Sane, MD  DRUG ALLERGIES:   Allergies  Allergen Reactions  . Sulfa  Antibiotics Anaphylaxis  . Tetracyclines & Related Anaphylaxis  . Latex Hives and Itching    DISCHARGE MEDICATIONS:   Current Discharge Medication List    START taking these medications   Details  lipase/protease/amylase (CREON) 36000 UNITS CPEP capsule Take 1 capsule (36,000 Units total) by mouth 3 (three) times daily before meals. Qty: 270 capsule, Refills: 0    mirtazapine (REMERON) 30 MG tablet Take 1 tablet (30 mg total) by mouth at bedtime. Qty: 30 tablet, Refills: 0    Nutritional Supplements (FEEDING SUPPLEMENT, JEVITY 1.5 CAL/FIBER,) LIQD Place 237 mLs into feeding tube 5 (five) times daily. Qty: 1500 mL, Refills: 30      CONTINUE these medications which have NOT CHANGED   Details  gabapentin (NEURONTIN) 300 MG capsule Take 1 capsule (300 mg total) by mouth 3 (three) times daily. Qty: 90 capsule, Refills: 1    HYDROcodone-acetaminophen (NORCO) 5-325 MG per tablet Take 1-2 tablets by mouth every 4 (four) hours as needed for pain. Qty: 20 tablet, Refills: 0        DIET:  Regular diet  DISCHARGE CONDITION:  Good  ACTIVITY:  Activity as tolerated  OXYGEN:  Home Oxygen: No.   Oxygen Delivery: room air  DISCHARGE LOCATION:  home   If you experience worsening of your admission symptoms, develop shortness of breath, life threatening emergency, suicidal or homicidal thoughts you must seek medical attention immediately by calling 911 or calling your MD immediately  if symptoms less severe.  You Must read complete instructions/literature along with all the possible adverse reactions/side effects for all the Medicines  you take and that have been prescribed to you. Take any new Medicines after you have completely understood and accpet all the possible adverse reactions/side effects.   Please note  You were cared for by a hospitalist during your hospital stay. If you have any questions about your discharge medications or the care you received while you were in the  hospital after you are discharged, you can call the unit and asked to speak with the hospitalist on call if the hospitalist that took care of you is not available. Once you are discharged, your primary care physician will handle any further medical issues. Please note that NO REFILLS for any discharge medications will be authorized once you are discharged, as it is imperative that you return to your primary care physician (or establish a relationship with a primary care physician if you do not have one) for your aftercare needs so that they can reassess your need for medications and monitor your lab values.   On the date of discharge   VITAL SIGNS:  Blood pressure 106/60, pulse 68, temperature 98.5 F (36.9 C), temperature source Oral, resp. rate 16, height 5' 6"  (1.676 m), weight 43.092 kg (95 lb), last menstrual period 11/24/2014, SpO2 100 %.  I/O:   Intake/Output Summary (Last 24 hours) at 01/13/15 1240 Last data filed at 01/13/15 1030  Gross per 24 hour  Intake   1217 ml  Output   1600 ml  Net   -383 ml    PHYSICAL EXAMINATION:  GENERAL:  29 y.o.-year-old patient lying in the bed with no acute distress.  EYES: Pupils equal, round, reactive to light and accommodation. No scleral icterus. Extraocular muscles intact.  HEENT: Head atraumatic, normocephalic. Oropharynx and nasopharynx clear.  NECK:  Supple, no jugular venous distention. No thyroid enlargement, no tenderness.  LUNGS: Normal breath sounds bilaterally, no wheezing, rales,rhonchi or crepitation. No use of accessory muscles of respiration.  CARDIOVASCULAR: S1, S2 normal. No murmurs, rubs, or gallops.  ABDOMEN: Soft, non-tender, non-distended. Bowel sounds present. No organomegaly or mass.  EXTREMITIES: No pedal edema, cyanosis, or clubbing.  NEUROLOGIC: Cranial nerves II through XII are intact. Muscle strength 5/5 in all extremities. Sensation intact. Gait not checked.  PSYCHIATRIC: The patient is alert and oriented x 3.   SKIN: No obvious rash, lesion, or ulcer.   DATA REVIEW:   CBC  Recent Labs Lab 01/10/15 0449 01/11/15 0543  HGB 10.0*  --   PLT  --  161    Chemistries   Recent Labs Lab 01/10/15 0449  01/12/15 0533 01/13/15 0613  NA  --   --  139  --   K  --   < > 3.3* 3.8  CL  --   --  104  --   CO2  --   --  28  --   GLUCOSE  --   --  68  --   BUN  --   --  9  --   CREATININE  --   --  0.40*  --   CALCIUM  --   --  8.4*  --   MG  --   < > 1.8 1.9  AST 101*  --   --   --   ALT 67*  --   --   --   ALKPHOS 87  --   --   --   BILITOT 0.6  --   --   --   < > = values in this interval  not displayed.    Management plans discussed with the patient, family and they are in agreement.  CODE STATUS: Full code  TOTAL TIME TAKING CARE OF THIS PATIENT: 45 minutes.    [facility], Christian Borgerding M.D on 01/13/2015 at 12:40 PM  Between 7am to 6pm - Pager - [phone]  After 6pm go to [email or web address] - password [email or web address]  Office  [phone]  CC: Primary care physician; [facility]; [address], [place] - [phone]

## 2015-01-13 NOTE — ED Notes (Signed)
Pt arrives via EMS from home with new onset seizure. Per EMS, pt was just discharged from [facility] with malnourishment and EtoH, J tube placed during this visit. Pt had witness grandmal seizure lasting 2-3 minutes. Mouth trauma present, post ictal period. Pt has no hx of seizures. Pt is alert and orientedx4.

## 2015-01-13 NOTE — Care Management (Signed)
Patient discharging home today followed by [facility]. I have notified Holley Dexter with [facility] care. Will with [facility] is arranging PEG feeds to be delivered to patient's home address. No further RNCM needs.

## 2015-01-14 LAB — RAPID URINE DRUG SCREEN, HOSP PERFORMED
Amphetamines: NOT DETECTED
Barbiturates: NOT DETECTED
Benzodiazepines: POSITIVE — AB
Cocaine: NOT DETECTED
Opiates: NOT DETECTED
Tetrahydrocannabinol: NOT DETECTED

## 2015-01-14 MED ORDER — LORAZEPAM 1 MG PO TABS: 1.0000 mg | ORAL_TABLET | Freq: Two times a day (BID) | ORAL | Status: DC | PRN

## 2015-01-14 MED ORDER — PANCRELIPASE (LIP-PROT-AMYL) 12000-38000 UNITS PO CPEP
36000.0000 [IU] | ORAL_CAPSULE | Freq: Once | ORAL | Status: AC
Start: 2015-01-14 — End: 2015-01-14
  Administered 2015-01-14: 36000 [IU] via ORAL
  Filled 2015-01-14: qty 3

## 2015-01-14 NOTE — ED Notes (Signed)
Pt ambulated in hallway w/o problem

## 2015-01-14 NOTE — Discharge Instructions (Signed)
Be sure to take all your medications as prescribed and stick with the liquid diet as advised by providers at [facility] until you follow up with your Primary Care Provider.  See below for further instructions.   Alcohol Use Disorder Alcohol use disorder is a mental disorder. It is not a one-time incident of heavy drinking. Alcohol use disorder is the excessive and uncontrollable use of alcohol over time that leads to problems with functioning in one or more areas of daily living. People with this disorder risk harming themselves and others when they drink to excess. Alcohol use disorder also can cause other mental disorders, such as mood and anxiety disorders, and serious physical problems. People with alcohol use disorder often misuse other drugs.  Alcohol use disorder is common and widespread. Some people with this disorder drink alcohol to cope with or escape from negative life events. Others drink to relieve chronic pain or symptoms of mental illness. People with a family history of alcohol use disorder are at higher risk of losing control and using alcohol to excess.  SYMPTOMS  Signs and symptoms of alcohol use disorder may include the following:   Consumption ofalcohol inlarger amounts or over a longer period of time than intended.  Multiple unsuccessful attempts to cutdown or control alcohol use.   A great deal of time spent obtaining alcohol, using alcohol, or recovering from the effects of alcohol (hangover).  A strong desire or urge to use alcohol (cravings).   Continued use of alcohol despite problems at work, school, or home because of alcohol use.   Continued use of alcohol despite problems in relationships because of alcohol use.  Continued use of alcohol in situations when it is physically hazardous, such as driving a car.  Continued use of alcohol despite awareness of a physical or psychological problem that is likely related to alcohol use. Physical problems  related to alcohol use can involve the brain, heart, liver, stomach, and intestines. Psychological problems related to alcohol use include intoxication, depression, anxiety, psychosis, delirium, and dementia.   The need for increased amounts of alcohol to achieve the same desired effect, or a decreased effect from the consumption of the same amount of alcohol (tolerance).  Withdrawal symptoms upon reducing or stopping alcohol use, or alcohol use to reduce or avoid withdrawal symptoms. Withdrawal symptoms include:  Racing heart.  Hand tremor.  Difficulty sleeping.  Nausea.  Vomiting.  Hallucinations.  Restlessness.  Seizures. DIAGNOSIS Alcohol use disorder is diagnosed through an assessment by your health care provider. Your health care provider may start by asking three or four questions to screen for excessive or problematic alcohol use. To confirm a diagnosis of alcohol use disorder, at least two symptoms must be present within a 71-monthperiod. The severity of alcohol use disorder depends on the number of symptoms:  Mild--two or three.  Moderate--four or five.  Severe--six or more. Your health care provider may perform a physical exam or use results from lab tests to see if you have physical problems resulting from alcohol use. Your health care provider may refer you to a mental health professional for evaluation. TREATMENT  Some people with alcohol use disorder are able to reduce their alcohol use to low-risk levels. Some people with alcohol use disorder need to quit drinking alcohol. When necessary, mental health professionals with specialized training in substance use treatment can help. Your health care provider can help you decide how severe your alcohol use disorder is and what type of treatment you  need. The following forms of treatment are available:   Detoxification. Detoxification involves the use of prescription medicines to prevent alcohol withdrawal symptoms in the  first week after quitting. This is important for people with a history of symptoms of withdrawal and for heavy drinkers who are likely to have withdrawal symptoms. Alcohol withdrawal can be dangerous and, in severe cases, cause death. Detoxification is usually provided in a hospital or in-patient substance use treatment facility.  Counseling or talk therapy. Talk therapy is provided by substance use treatment counselors. It addresses the reasons people use alcohol and ways to keep them from drinking again. The goals of talk therapy are to help people with alcohol use disorder find healthy activities and ways to cope with life stress, to identify and avoid triggers for alcohol use, and to handle cravings, which can cause relapse.  Medicines.Different medicines can help treat alcohol use disorder through the following actions:  Decrease alcohol cravings.  Decrease the positive reward response felt from alcohol use.  Produce an uncomfortable physical reaction when alcohol is used (aversion therapy).  Support groups. Support groups are run by people who have quit drinking. They provide emotional support, advice, and guidance. These forms of treatment are often combined. Some people with alcohol use disorder benefit from intensive combination treatment provided by specialized substance use treatment centers. Both inpatient and outpatient treatment programs are available. Document Released: 09/20/2004 Document Revised: 12/28/2013 Document Reviewed: 11/20/2012 [facility] Patient Information 2015 [place], [place]. This information is not intended to replace advice given to you by your health care provider. Make sure you discuss any questions you have with your health care provider.  Alcohol and Nutrition Nutrition serves two purposes. It provides energy. It also maintains body structure and function. Food supplies energy. It also provides the building blocks needed to replace worn or damaged cells.  Alcoholics often eat poorly. This limits their supply of essential nutrients. This affects energy supply and structure maintenance. Alcohol also affects the body's nutrients in:  Digestion.  Storage.  Using and getting rid of waste products. IMPAIRMENT OF NUTRIENT DIGESTION AND UTILIZATION   Once ingested, food must be broken down into small components (digested). Then it is available for energy. It helps maintain body structure and function. Digestion begins in the mouth. It continues in the stomach and intestines, with help from the pancreas. The nutrients from digested food are absorbed from the intestines into the blood. Then they are carried to the liver. The liver prepares nutrients for:  Immediate use.  Storage and future use.  Alcohol inhibits the breakdown of nutrients into usable molecules.  It decreases secretion of digestive enzymes from the pancreas.  Alcohol impairs nutrient absorption by damaging the cells lining the stomach and intestines.  It also interferes with moving some nutrients into the blood.  In addition, nutritional deficiencies themselves may lead to further absorption problems.  For example, folate deficiency changes the cells that line the small intestine. This impairs how water is absorbed. It also affects absorbed nutrients. These include glucose, sodium, and additional folate.  Even if nutrients are digested and absorbed, alcohol can prevent them from being fully used. It changes their transport, storage, and excretion. Impaired utilization of nutrients by alcoholics is indicated by:  Decreased liver stores of vitamins, such as vitamin A.  Increased excretion of nutrients such as fat. ALCOHOL AND ENERGY SUPPLY   Three basic nutritional components found in food are:  Carbohydrates.  Proteins.  Fats.  These are used as energy. Some  alcoholics take in as much as 50% of their total daily calories from alcohol. They often neglect important  foods.  Even when enough food is eaten, alcohol can impair the ways the body controls blood sugar (glucose) levels. It may either increase or decrease blood sugar.  In non-diabetic alcoholics, increased blood sugar (hyperglycemia) is caused by poor insulin secretion. It is usually temporary.  Decreased blood sugar (hypoglycemia) can cause serious injury even if this condition is short-lived. Low blood sugar can happen when a fasting or malnourished person drinks alcohol. When there is no food to supply energy, stored sugar is used up. The products of alcohol inhibit forming glucose from other compounds such as amino acids. As a result, alcohol causes the brain and other body tissue to lack glucose. It is needed for energy and function.  Alcohol is an energy source. But how the body processes and uses the energy from alcohol is complex. Also, when alcohol is substituted for carbohydrates, subjects tend to lose weight. This indicates that they get less energy from alcohol than from food. ALCOHOL - MAINTAINING CELL STRUCTURE AND FUNCTION  Structure Cells are made mostly of protein. So an adequate protein diet is important for maintaining cell structure. This is especially true if cells are being damaged. Research indicates that alcohol affects protein nutrition by causing impaired:  Digestion of proteins to amino acids.  Processing of amino acids by the small intestine and liver.  Synthesis of proteins from amino acids.  Protein secretion by the liver. Function Nutrients are essential for the body to function well. They provide the tools that the body needs to work well:   Proteins.  Vitamins.  Minerals. Alcohol can disrupt body function. It may cause nutrient deficiencies. And it may interfere with the way nutrients are processed. Vitamins  Vitamins are essential to maintain growth and normal metabolism. They regulate many of the body`s processes. Chronic heavy drinking causes  deficiencies in many vitamins. This is caused by eating less. And, in some cases, vitamins may be poorly absorbed. For example, alcohol inhibits fat absorption. It impairs how the vitamins A, E, and D are normally absorbed along with dietary fats. Not enough vitamin A may cause night blindness. Not enough vitamin D may cause softening of the bones.  Some alcoholics lack vitamins A, C, D, E, K, and the B vitamins. These are all involved in wound healing and cell maintenance. In particular, because vitamin K is necessary for blood clotting, lacking that vitamin can cause delayed clotting. The result is excess bleeding. Lacking other vitamins involved in brain function may cause severe neurological damage. Minerals Deficiencies of minerals such as calcium, magnesium, iron, and zinc are common in alcoholics. The alcohol itself does not seem to affect how these minerals are absorbed. Rather, they seem to occur secondary to other alcohol-related problems, such as:  Less calcium absorbed.  Not enough magnesium.  More urinary excretion.  Vomiting.  Diarrhea.  Not enough iron due to gastrointestinal bleeding.  Not enough zinc or losses related to other nutrient deficiencies.  Mineral deficiencies can cause a variety of medical consequences. These range from calcium-related bone disease to zinc-related night blindness and skin lesions. ALCOHOL, MALNUTRITION, AND MEDICAL COMPLICATIONS  Liver Disease   Alcoholic liver damage is caused primarily by alcohol itself. But poor nutrition may increase the risk of alcohol-related liver damage. For example, nutrients normally found in the liver are known to be affected by drinking alcohol. These include carotenoids, which are the major sources  of vitamin A, and vitamin E compounds. Decreases in such nutrients may play some role in alcohol-related liver damage. Pancreatitis  Research suggests that malnutrition may increase the risk of developing alcoholic  pancreatitis. Research suggests that a diet lacking in protein may increase alcohol's damaging effect on the pancreas. Brain  Nutritional deficiencies may have severe effects on brain function. These may be permanent. Specifically, thiamine deficiencies are often seen in alcoholics. They can cause severe neurological problems. These include:  Impaired movement.  Memory loss seen in Wernicke-Korsakoff syndrome. Pregnancy  Alcohol has toxic effects on fetal development. It causes alcohol-related birth defects. They include fetal alcohol syndrome. Alcohol itself is toxic to the fetus. Also, the nutritional deficiency can affect how the fetus develops. That may compound the risk of developmental damage.  Nutritional needs during pregnancy are 10% to 30% greater than normal. Food intake can increase by as much as 140% to cover the needs of both mother and fetus. An alcoholic mother`s nutritional problems may adversely affect the nutrition of the fetus. And alcohol itself can also restrict nutrition flow to the fetus. NUTRITIONAL STATUS OF ALCOHOLICS  Techniques for assessing nutritional status include:  Taking body measurements to estimate fat reserves. They include:  Weight.  Height.  Mass.  Skin fold thickness.  Performing blood analysis to provide measurements of circulating:  Proteins.  Vitamins.  Minerals.  These techniques tend to be imprecise. For many nutrients, there is no clear "cut-off" point that would allow an accurate definition of deficiency. So assessing the nutritional status of alcoholics is limited by these techniques. Dietary status may provide information about the risk of developing nutritional problems. Dietary status is assessed by:  Taking patients' dietary histories.  Evaluating the amount and types of food they are eating.  It is difficult to determine what exact amount of alcohol begins to have damaging effects on nutrition. In general, moderate drinkers  have 2 drinks or less per day. They seem to be at little risk for nutritional problems. Various medical disorders begin to appear at greater levels.  Research indicates that the majority of even the heaviest drinkers have few obvious nutritional deficiencies. Many alcoholics who are hospitalized for medical complications of their disease do have severe malnutrition. Alcoholics tend to eat poorly. Often they eat less than the amounts of food necessary to provide enough:  Carbohydrates.  Protein.  Fat.  Vitamins A and C.  B vitamins.  Minerals like calcium and iron. Of major concern is alcohol's effect on digesting food and use of nutrients. It may shift a mildly malnourished person toward severe malnutrition. Document Released: 06/07/2005 Document Revised: 11/05/2011 Document Reviewed: 11/21/2005 [facility] Patient Information 2015 [place], [place]. This information is not intended to replace advice given to you by your health care provider. Make sure you discuss any questions you have with your health care provider.

## 2015-01-14 NOTE — ED Notes (Signed)
Patient is alert and orientedx4.  Patient was explained discharge instructions and they understood them with no questions.  The patient's boyfriend, Maryella Shivers is taking her home.

## 2015-01-14 NOTE — Care Management Note (Signed)
Late Entry 01/14/2015: RNCM spoke with Dr. Manuella Ghazi regarding this patient calling with concerns that [place] [facility] had not made contact. Spoke with Lurlean Leyden [phone]) at [facility]. [facility] attempted to deliver tube feedings last night and pt refused stating she was on her way to the emergency room. [facility] has been in contact with the patient this morning to make a scheduled visit again. Attempted to reach patient at [phone]. Left message on voice mail with update on [facility] coming out today for delivery on PEG feedings and nursing visit. RNCM left call back number should she have any concerns. Dr. Manuella Ghazi updated.

## 2015-01-18 ENCOUNTER — Encounter: Payer: Self-pay | Admitting: Gastroenterology

## 2015-01-30 DIAGNOSIS — K2980 Duodenitis without bleeding: Secondary | ICD-10-CM | POA: Insufficient documentation

## 2015-02-25 DIAGNOSIS — Q453 Other congenital malformations of pancreas and pancreatic duct: Secondary | ICD-10-CM

## 2015-02-25 HISTORY — DX: Other congenital malformations of pancreas and pancreatic duct: Q45.3

## 2015-03-01 ENCOUNTER — Telehealth: Payer: Self-pay | Admitting: Gastroenterology

## 2015-03-01 ENCOUNTER — Emergency Department: Payer: Medicaid Other

## 2015-03-01 ENCOUNTER — Encounter: Payer: Self-pay | Admitting: *Deleted

## 2015-03-01 ENCOUNTER — Observation Stay
Admission: EM | Admit: 2015-03-01 | Discharge: 2015-03-03 | Disposition: A | Discharge status: Home / Self Care | Payer: Medicaid Other | Attending: Internal Medicine | Admitting: Internal Medicine

## 2015-03-01 DIAGNOSIS — Q453 Other congenital malformations of pancreas and pancreatic duct: Secondary | ICD-10-CM | POA: Insufficient documentation

## 2015-03-01 DIAGNOSIS — R197 Diarrhea, unspecified: Secondary | ICD-10-CM

## 2015-03-01 DIAGNOSIS — R1110 Vomiting, unspecified: Secondary | ICD-10-CM | POA: Diagnosis present

## 2015-03-01 DIAGNOSIS — R0602 Shortness of breath: Secondary | ICD-10-CM | POA: Diagnosis not present

## 2015-03-01 DIAGNOSIS — R634 Abnormal weight loss: Secondary | ICD-10-CM | POA: Diagnosis not present

## 2015-03-01 DIAGNOSIS — F17210 Nicotine dependence, cigarettes, uncomplicated: Secondary | ICD-10-CM | POA: Insufficient documentation

## 2015-03-01 DIAGNOSIS — E871 Hypo-osmolality and hyponatremia: Secondary | ICD-10-CM | POA: Insufficient documentation

## 2015-03-01 DIAGNOSIS — Z931 Gastrostomy status: Secondary | ICD-10-CM | POA: Insufficient documentation

## 2015-03-01 DIAGNOSIS — R000 Tachycardia, unspecified: Secondary | ICD-10-CM | POA: Diagnosis not present

## 2015-03-01 DIAGNOSIS — R627 Adult failure to thrive: Secondary | ICD-10-CM | POA: Insufficient documentation

## 2015-03-01 DIAGNOSIS — R112 Nausea with vomiting, unspecified: Secondary | ICD-10-CM | POA: Diagnosis not present

## 2015-03-01 DIAGNOSIS — K828 Other specified diseases of gallbladder: Secondary | ICD-10-CM | POA: Insufficient documentation

## 2015-03-01 DIAGNOSIS — E876 Hypokalemia: Secondary | ICD-10-CM | POA: Insufficient documentation

## 2015-03-01 DIAGNOSIS — E43 Unspecified severe protein-calorie malnutrition: Secondary | ICD-10-CM | POA: Diagnosis present

## 2015-03-01 DIAGNOSIS — F1010 Alcohol abuse, uncomplicated: Secondary | ICD-10-CM | POA: Insufficient documentation

## 2015-03-01 DIAGNOSIS — K760 Fatty (change of) liver, not elsewhere classified: Secondary | ICD-10-CM | POA: Insufficient documentation

## 2015-03-01 DIAGNOSIS — R1013 Epigastric pain: Secondary | ICD-10-CM | POA: Insufficient documentation

## 2015-03-01 DIAGNOSIS — K529 Noninfective gastroenteritis and colitis, unspecified: Secondary | ICD-10-CM | POA: Insufficient documentation

## 2015-03-01 DIAGNOSIS — F10920 Alcohol use, unspecified with intoxication, uncomplicated: Secondary | ICD-10-CM

## 2015-03-01 DIAGNOSIS — R1032 Left lower quadrant pain: Secondary | ICD-10-CM | POA: Diagnosis not present

## 2015-03-01 DIAGNOSIS — F419 Anxiety disorder, unspecified: Secondary | ICD-10-CM | POA: Diagnosis not present

## 2015-03-01 DIAGNOSIS — K5090 Crohn's disease, unspecified, without complications: Secondary | ICD-10-CM

## 2015-03-01 HISTORY — DX: Adult failure to thrive: R62.7

## 2015-03-01 HISTORY — DX: Other congenital malformations of pancreas and pancreatic duct: Q45.3

## 2015-03-01 LAB — URINALYSIS COMPLETE WITH MICROSCOPIC (ARMC ONLY)
BILIRUBIN URINE: NEGATIVE
Glucose, UA: NEGATIVE mg/dL
HGB URINE DIPSTICK: NEGATIVE
Ketones, ur: NEGATIVE mg/dL
Leukocytes, UA: NEGATIVE
Nitrite: NEGATIVE
Protein, ur: NEGATIVE mg/dL
RBC / HPF: NONE SEEN RBC/hpf (ref 0–5)
SPECIFIC GRAVITY, URINE: 1.011 (ref 1.005–1.030)
pH: 5 (ref 5.0–8.0)

## 2015-03-01 LAB — URINE DRUG SCREEN, QUALITATIVE (ARMC ONLY)
Amphetamines, Ur Screen: NOT DETECTED
BARBITURATES, UR SCREEN: NOT DETECTED
BENZODIAZEPINE, UR SCRN: POSITIVE — AB
CANNABINOID 50 NG, UR ~~LOC~~: NOT DETECTED
COCAINE METABOLITE, UR ~~LOC~~: NOT DETECTED
MDMA (Ecstasy)Ur Screen: NOT DETECTED
Methadone Scn, Ur: NOT DETECTED
Opiate, Ur Screen: NOT DETECTED
Phencyclidine (PCP) Ur S: NOT DETECTED
TRICYCLIC, UR SCREEN: NOT DETECTED

## 2015-03-01 LAB — CBC WITH DIFFERENTIAL/PLATELET
Basophils Absolute: 0 10*3/uL (ref 0–0.1)
Basophils Relative: 0 %
EOS ABS: 0 10*3/uL (ref 0–0.7)
Eosinophils Relative: 0 %
HCT: 46.9 % (ref 35.0–47.0)
Hemoglobin: 15.9 g/dL (ref 12.0–16.0)
LYMPHS ABS: 2.3 10*3/uL (ref 1.0–3.6)
Lymphocytes Relative: 36 %
MCH: 32.7 pg (ref 26.0–34.0)
MCHC: 34 g/dL (ref 32.0–36.0)
MCV: 96.4 fL (ref 80.0–100.0)
MONOS PCT: 7 %
Monocytes Absolute: 0.5 10*3/uL (ref 0.2–0.9)
NEUTROS ABS: 3.5 10*3/uL (ref 1.4–6.5)
Neutrophils Relative %: 57 %
PLATELETS: 156 10*3/uL (ref 150–440)
RBC: 4.87 MIL/uL (ref 3.80–5.20)
RDW: 14.2 % (ref 11.5–14.5)
WBC: 6.3 10*3/uL (ref 3.6–11.0)

## 2015-03-01 LAB — COMPREHENSIVE METABOLIC PANEL
ALBUMIN: 3.8 g/dL (ref 3.5–5.0)
ALK PHOS: 119 U/L (ref 38–126)
ALT: 132 U/L — AB (ref 14–54)
AST: 96 U/L — ABNORMAL HIGH (ref 15–41)
Anion gap: 14 (ref 5–15)
BUN: 12 mg/dL (ref 6–20)
CALCIUM: 8.5 mg/dL — AB (ref 8.9–10.3)
CO2: 32 mmol/L (ref 22–32)
Chloride: 88 mmol/L — ABNORMAL LOW (ref 101–111)
Creatinine, Ser: 0.51 mg/dL (ref 0.44–1.00)
GFR calc Af Amer: >60 mL/min (ref >60)
GFR calc non Af Amer: >60 mL/min (ref >60)
Glucose, Bld: 107 mg/dL — ABNORMAL HIGH (ref 65–99)
Potassium: 3 mmol/L — ABNORMAL LOW (ref 3.5–5.1)
SODIUM: 134 mmol/L — AB (ref 135–145)
TOTAL PROTEIN: 7.4 g/dL (ref 6.5–8.1)
Total Bilirubin: 0.7 mg/dL (ref 0.3–1.2)

## 2015-03-01 LAB — PHOSPHORUS: PHOSPHORUS: 3.3 mg/dL (ref 2.5–4.6)

## 2015-03-01 LAB — MAGNESIUM: Magnesium: 1.9 mg/dL (ref 1.7–2.4)

## 2015-03-01 LAB — ETHANOL: ALCOHOL ETHYL (B): 150 mg/dL — AB (ref <5)

## 2015-03-01 MED ORDER — SODIUM CHLORIDE 0.9 % IV SOLN
INTRAVENOUS | Status: DC
  Administered 2015-03-01 – 2015-03-03 (×4): via INTRAVENOUS

## 2015-03-01 MED ORDER — IOHEXOL 300 MG/ML  SOLN
100.0000 mL | Freq: Once | INTRAMUSCULAR | Status: AC | PRN
  Administered 2015-03-01: 100 mL via INTRAVENOUS

## 2015-03-01 MED ORDER — ONDANSETRON HCL 4 MG/2ML IJ SOLN
4.0000 mg | Freq: Once | INTRAMUSCULAR | Status: AC
  Administered 2015-03-01: 4 mg via INTRAVENOUS

## 2015-03-01 MED ORDER — HEPARIN SODIUM (PORCINE) 5000 UNIT/ML IJ SOLN
5000.0000 [IU] | Freq: Three times a day (TID) | INTRAMUSCULAR | Status: DC
  Administered 2015-03-01 – 2015-03-03 (×5): 5000 [IU] via SUBCUTANEOUS
  Filled 2015-03-01 (×5): qty 1

## 2015-03-01 MED ORDER — THIAMINE HCL 100 MG/ML IJ SOLN
INTRAMUSCULAR | Status: AC
  Filled 2015-03-01: qty 2

## 2015-03-01 MED ORDER — MORPHINE SULFATE 2 MG/ML IJ SOLN: 2.0000 mg | INTRAMUSCULAR | Status: DC | PRN

## 2015-03-01 MED ORDER — LORAZEPAM 2 MG/ML IJ SOLN
2.0000 mg | INTRAMUSCULAR | Status: DC | PRN
  Administered 2015-03-01 – 2015-03-02 (×3): 2 mg via INTRAVENOUS
  Administered 2015-03-03: 3 mg via INTRAVENOUS
  Administered 2015-03-03: 2 mg via INTRAVENOUS
  Filled 2015-03-01 (×2): qty 1
  Filled 2015-03-01: qty 2
  Filled 2015-03-01 (×2): qty 1

## 2015-03-01 MED ORDER — SODIUM CHLORIDE 0.9 % IV BOLUS (SEPSIS)
1000.0000 mL | Freq: Once | INTRAVENOUS | Status: AC
  Administered 2015-03-01: 1000 mL via INTRAVENOUS

## 2015-03-01 MED ORDER — ACETAMINOPHEN 325 MG PO TABS: 650.0000 mg | ORAL_TABLET | Freq: Four times a day (QID) | ORAL | Status: DC | PRN

## 2015-03-01 MED ORDER — IOHEXOL 240 MG/ML SOLN
50.0000 mL | Freq: Once | INTRAMUSCULAR | Status: AC | PRN
  Administered 2015-03-01: 50 mL via ORAL

## 2015-03-01 MED ORDER — ONDANSETRON HCL 4 MG/2ML IJ SOLN
INTRAMUSCULAR | Status: AC
  Administered 2015-03-01: 4 mg via INTRAVENOUS
  Filled 2015-03-01: qty 2

## 2015-03-01 MED ORDER — POTASSIUM CHLORIDE CRYS ER 20 MEQ PO TBCR
EXTENDED_RELEASE_TABLET | ORAL | Status: AC
  Administered 2015-03-01: 40 meq via ORAL
  Filled 2015-03-01: qty 2

## 2015-03-01 MED ORDER — ACETAMINOPHEN 650 MG RE SUPP: 650.0000 mg | Freq: Four times a day (QID) | RECTAL | Status: DC | PRN

## 2015-03-01 MED ORDER — POTASSIUM CHLORIDE 10 MEQ/100ML IV SOLN
10.0000 meq | INTRAVENOUS | Status: AC
  Administered 2015-03-01 – 2015-03-02 (×4): 10 meq via INTRAVENOUS
  Filled 2015-03-01 (×4): qty 100

## 2015-03-01 MED ORDER — LORAZEPAM 1 MG PO TABS: 1.0000 mg | ORAL_TABLET | Freq: Two times a day (BID) | ORAL | Status: DC | PRN

## 2015-03-01 MED ORDER — FOLIC ACID 5 MG/ML IJ SOLN
1.0000 mg | Freq: Every day | INTRAMUSCULAR | Status: DC
  Administered 2015-03-01: 23:00:00 1 mg via INTRAVENOUS
  Filled 2015-03-01 (×2): qty 0.2

## 2015-03-01 MED ORDER — POTASSIUM CHLORIDE CRYS ER 20 MEQ PO TBCR
40.0000 meq | EXTENDED_RELEASE_TABLET | Freq: Once | ORAL | Status: AC
Start: 2015-03-01 — End: 2015-03-01
  Administered 2015-03-01: 40 meq via ORAL

## 2015-03-01 MED ORDER — THIAMINE HCL 100 MG/ML IJ SOLN
100.0000 mg | Freq: Every day | INTRAMUSCULAR | Status: DC
  Administered 2015-03-01: 22:00:00 100 mg via INTRAVENOUS
  Filled 2015-03-01 (×2): qty 2

## 2015-03-01 NOTE — Progress Notes (Signed)
Patient requested PRN Ativan for anxiety, takes at home twice a day. Dr. Lavetta Nielsen notified, order 1 mg Ativan PO twice a day as needed. No need to use feeding tube at this time.

## 2015-03-01 NOTE — Telephone Encounter (Signed)
Kristine Macias came to GI clinic today at [facility].   She has lost 8 pounds in past week, can't keep down tube feeds or liquids, and is having abd pain near the PEG site. She is tachycardic and appears sickly. She does have possible Crohn's disease but is also struggling with ETOH use and life issues ( husband left recently).  She agrees she needs help and her current path is not sustainable.     I recommend she go to the ED for :  IVF Labs UDS CT a/p Likely admission to Psych or medicine depending on above w/u.

## 2015-03-01 NOTE — ED Notes (Signed)
Has had ferver, here for weight loss, has gt tube, etoh abuse last 3 days used 3 _ 6 packs

## 2015-03-01 NOTE — ED Provider Notes (Signed)
[facility] Emergency Department Provider Note   ____________________________________________  Time seen: 4 PM I have reviewed the triage vital signs and the triage nursing note.  HISTORY  Chief Complaint Weight Loss   Historian Patient, who is a poor historian  HPI Kristine Macias is a 29 y.o. female who has a history of severe anxiety, alcohol abuse, and history of weight loss due to this who is followed by gastroenterology and had an appointment today in follow-up after a G-tube was placed 1 month ago, and was sent in for evaluation due to nausea, diarrhea, and failure to gain weight. Patient reports she did regain a 6 pack of beer for the past 3 days. She's had diarrhea for 2-3 days. She think she had a fever overnight.She was a little bit short of breath, and has a history of chronic recurrence of a right-sided pneumothorax. For her G-tube feeds she uses Jevity and does a bolus 3 times daily.   Past Medical History  Diagnosis Date  . Anxiety   . Chlamydia 2007  . Irregular heart beat 2010    wt/diet related after evaluation  . Renal disorder   . Pneumothorax, spontaneous, tension   . Alcohol abuse   . Pancreas divisum   . Failure to thrive in adult     Patient Active Problem List   Diagnosis Date Noted  . Vomiting and diarrhea   . Failure to thrive in adult   . Intractable nausea and vomiting 03/01/2015  . Hypokalemia 03/01/2015  . Protein-calorie malnutrition, severe 01/05/2015  . Alcohol abuse   . Generalized anxiety disorder 11/26/2014    Class: Chronic  . Alcohol use disorder, severe, dependence 11/25/2014  . Adnexal mass 04/10/2012    Past Surgical History  Procedure Laterality Date  . Cesarean section    . Chest tube insertion    . Esophagogastroduodenoscopy N/A 01/06/2015    Procedure: ESOPHAGOGASTRODUODENOSCOPY (EGD);  Surgeon: Josefine Class, MD;  Location: [facility] ENDOSCOPY;  Service: Endoscopy;  Laterality: N/A;  .  Colonoscopy with propofol N/A 01/06/2015    Procedure: COLONOSCOPY WITH PROPOFOL;  Surgeon: Josefine Class, MD;  Location: [facility] ENDOSCOPY;  Service: Endoscopy;  Laterality: N/A;  . Peg placement N/A 01/11/2015    Procedure: PERCUTANEOUS ENDOSCOPIC GASTROSTOMY (PEG) PLACEMENT;  Surgeon: Josefine Class, MD;  Location: [facility] ENDOSCOPY;  Service: Endoscopy;  Laterality: N/A;  . Esophagogastroduodenoscopy N/A 01/11/2015    Rein-normal with PEG placement    Current Outpatient Rx  Name  Route  Sig  Dispense  Refill  . LORazepam (ATIVAN) 1 MG tablet   Oral   Take 1 tablet (1 mg total) by mouth 2 (two) times daily as needed for anxiety.   12 tablet   0   . folic acid (FOLVITE) 1 MG tablet   Oral   Take 1 tablet (1 mg total) by mouth daily.   30 tablet   0   . gabapentin (NEURONTIN) 300 MG capsule   Oral   Take 1 capsule (300 mg total) by mouth 3 (three) times daily. Patient not taking: Reported on 01/04/2015   90 capsule   1   . HYDROcodone-acetaminophen (NORCO) 5-325 MG per tablet   Oral   Take 1-2 tablets by mouth every 4 (four) hours as needed for pain. Patient not taking: Reported on 11/24/2014   20 tablet   0   . lipase/protease/amylase (CREON) 36000 UNITS CPEP capsule   Oral   Take 1 capsule (36,000 Units total) by  mouth 3 (three) times daily before meals. Patient not taking: Reported on 03/01/2015   270 capsule   0   . mirtazapine (REMERON) 30 MG tablet   Oral   Take 1 tablet (30 mg total) by mouth at bedtime. Patient not taking: Reported on 03/01/2015   30 tablet   0   . thiamine 100 MG tablet   Oral   Take 1 tablet (100 mg total) by mouth daily.   7 tablet   0     Allergies Effexor; Sulfa antibiotics; Tetracyclines & related; Trazodone and nefazodone; Latex; Paxil; Seroquel; and Zoloft  Family History  Problem Relation Age of Onset  . Anesthesia problems Neg Hx   . Thyroid disease Mother   . Cancer Father   . Stroke Other   . Crohn's disease Brother      Social History History  Substance Use Topics  . Smoking status: Current Every Day Smoker -- 0.25 packs/day    Types: Cigarettes  . Smokeless tobacco: Never Used  . Alcohol Use: 21.6 oz/week    36 Cans of beer per week     Comment: 6 beers/day   living with her mom  Review of Systems  Constitutional: Weight loss over months. Fever last night, subjective Eyes: Negative for visual changes. ENT: Negative for sore throat. Cardiovascular: Negative for chest pain. Respiratory: No cough, mild shortness of breath.. Gastrointestinal: Positive nausea and diarrhea as per history of present illness. Decreased appetite Genitourinary: Negative for dysuria. Musculoskeletal: Negative for back pain. Skin: Negative for rash. Neurological: Negative for headaches, focal weakness or numbness. 10 point Review of Systems otherwise negative ____________________________________________   PHYSICAL EXAM:  VITAL SIGNS: ED Triage Vitals  Enc Vitals Group     BP 03/01/15 1548 114/81 mmHg     Pulse Rate 03/01/15 1548 112     Resp 03/01/15 1548 20     Temp 03/01/15 1548 98.6 F (37 C)     Temp Source 03/01/15 1548 Oral     SpO2 03/01/15 1548 93 %     Weight --      Height 03/01/15 1548 5' 6"  (1.676 m)     Head Cir --      Peak Flow --      Pain Score 03/01/15 1548 5     Pain Loc --      Pain Edu? --      Excl. in [place]? --      Constitutional: Alert and oriented, but poor historian. Cachectic but in no distress. Eyes: Conjunctivae are normal. PERRL. Normal extraocular movements. ENT   Head: Normocephalic and atraumatic.   Nose: No congestion/rhinnorhea.   Mouth/Throat: Mucous membranes are mildly dry.   Neck: No stridor. Cardiovascular/Chest: Tachycardic, regular  No murmurs, rubs, or gallops. Respiratory: Normal respiratory effort without tachypnea nor retractions. Breath sounds are clear and equal bilaterally. No wheezes/rales/rhonchi. Gastrointestinal: Soft. No  distention, no guarding, no rebound. Nontender. G-tube at abdomen.  Genitourinary/rectal:Deferred Musculoskeletal: Muscle wasting in all muscle groups Neurologic:  Normal speech and language. No gross focal neurologic deficits are appreciated. Skin:  Skin is warm, dry and intact. No rash noted. Psychiatric: Anxious. Speech and behavior are normal.  ____________________________________________   EKG I, Lisa Roca, MD, the attending physician have personally viewed and interpreted all ECGs.  80 bpm. Normal sinus rhythm. Normal axis. Narrow QRS. QTC 488 ____________________________________________  LABS (pertinent positives/negatives)  CBC within normal limits Metabolic panel significant for sodium 134, potassium 3.0, chloride 88 AST is 96 and  a LT is 132 Ethanol 150  ____________________________________________  RADIOLOGY All Xrays were viewed by me. Imaging interpreted by Radiologist.  CT abdomen: __________________________________________  PROCEDURES  Procedure(s) performed: None Critical Care performed: None  ____________________________________________   ED COURSE / ASSESSMENT AND PLAN  CONSULTATIONS: Face-to-face with hospitalist for admission  Pertinent labs & imaging results that were available during my care of the patient were reviewed by me and considered in my medical decision making (see chart for details).  Per review of Dr. Reuel Derby note, patient has had 8 pounds weight loss over the past week and was complaining additionally of abdominal pain in addition to the nausea, vomiting, and diarrhea.  Patient having persistent nausea vomiting diarrhea with hypokalemia. That she needs medical evaluation until symptoms have resolved. At that point she may need psychiatric evaluation.  Patient / Family / Caregiver informed of clinical course, medical decision-making process, and agree with plan.   I discussed return precautions, follow-up instructions, and  discharged instructions with patient and/or family.  ___________________________________________   FINAL CLINICAL IMPRESSION(S) / ED DIAGNOSES   Final diagnoses:  Protein-calorie malnutrition, severe  Alcohol abuse  Alcohol intoxication, uncomplicated  Vomiting and diarrhea     Lisa Roca, MD 03/05/15 2043

## 2015-03-01 NOTE — H&P (Signed)
[facility] [place] NAME: Kristine Macias    MR#:  [id number]  DATE OF BIRTH:  Feb 02, 1986   DATE OF ADMISSION:  03/01/2015  PRIMARY CARE PHYSICIAN: Leonides Sake, MD   REQUESTING/REFERRING PHYSICIAN: Lisa Roca  CHIEF COMPLAINT:   Chief Complaint  Patient presents with  . Weight Loss    HISTORY OF PRESENT ILLNESS:  Kristine Macias  is a 29 y.o. female with a known history of anxiety not otherwise specified, failure to thrive status post G-tube insertion approximately 1 month ago, alcohol abuse presenting with nausea, vomiting, weight loss. Patient states under increased stress/anxiety she has recently started binge drinking again approximately 3 days ago. She has stopped taking her Ativan for anxiety as well. Also states nausea, vomiting nonbloody nonbilious emesis with associated abdominal pain located epigastric region described only as "pain" intensity 5/10 and no worsening or relieving factors nonradiating. With the above symptoms she called her gastroenterologist Dr. Rayann Heman was advised to present to Hospital further workup and evaluation  PAST MEDICAL HISTORY:   Past Medical History  Diagnosis Date  . Anxiety   . Chlamydia 2007  . Irregular heart beat 2010    wt/diet related after evaluation  . Renal disorder   . Pneumothorax, spontaneous, tension   . Alcohol abuse     PAST SURGICAL HISTORY:   Past Surgical History  Procedure Laterality Date  . Cesarean section    . Chest tube insertion    . Esophagogastroduodenoscopy N/A 01/06/2015    Procedure: ESOPHAGOGASTRODUODENOSCOPY (EGD);  Surgeon: Josefine Class, MD;  Location: [facility] ENDOSCOPY;  Service: Endoscopy;  Laterality: N/A;  . Colonoscopy with propofol N/A 01/06/2015    Procedure: COLONOSCOPY WITH PROPOFOL;  Surgeon: Josefine Class, MD;  Location: [facility] ENDOSCOPY;  Service: Endoscopy;  Laterality: N/A;  . Peg placement N/A 01/11/2015    Procedure:  PERCUTANEOUS ENDOSCOPIC GASTROSTOMY (PEG) PLACEMENT;  Surgeon: Josefine Class, MD;  Location: [facility] ENDOSCOPY;  Service: Endoscopy;  Laterality: N/A;  . Esophagogastroduodenoscopy N/A 01/11/2015    Procedure: ESOPHAGOGASTRODUODENOSCOPY (EGD);  Surgeon: Josefine Class, MD;  Location: [facility] ENDOSCOPY;  Service: Endoscopy;  Laterality: N/A;    SOCIAL HISTORY:   History  Substance Use Topics  . Smoking status: Current Every Day Smoker -- 0.25 packs/day    Types: Cigarettes  . Smokeless tobacco: Never Used  . Alcohol Use: 21.6 oz/week    36 Cans of beer per week    FAMILY HISTORY:   Family History  Problem Relation Age of Onset  . Anesthesia problems Neg Hx   . Thyroid disease Mother   . Cancer Father   . Stroke Other     DRUG ALLERGIES:   Allergies  Allergen Reactions  . Effexor [Venlafaxine] Anaphylaxis  . Sulfa Antibiotics Anaphylaxis  . Tetracyclines & Related Anaphylaxis  . Trazodone And Nefazodone Anaphylaxis  . Latex Hives, Itching and Rash  . Paxil [Paroxetine] Hives, Itching and Rash  . Seroquel [Quetiapine Fumarate] Hives, Itching and Rash  . Zoloft [Sertraline Hcl] Hives, Itching and Rash    REVIEW OF SYSTEMS:  REVIEW OF SYSTEMS:  CONSTITUTIONAL: Denies fevers, chills, fatigue, weakness.  EYES: Denies blurred vision, double vision, or eye pain.  EARS, NOSE, THROAT: Denies tinnitus, ear pain, hearing loss.  RESPIRATORY: denies cough, shortness of breath, wheezing  CARDIOVASCULAR: Denies chest pain, palpitations, edema.  GASTROINTESTINAL:Positive nausea, vomiting, abdominal pain. Denies diarrhea/constipation  GENITOURINARY: Denies dysuria, hematuria.  ENDOCRINE: Denies nocturia or thyroid problems. HEMATOLOGIC AND  LYMPHATIC: Denies easy bruising or bleeding.  SKIN: Denies rash or lesions.  MUSCULOSKELETAL: Denies pain in neck, back, shoulder, knees, hips, or further arthritic symptoms.  NEUROLOGIC: Denies paralysis, paresthesias.  PSYCHIATRIC: Denies  anxiety or depressive symptoms.Denies homicidal/suicidal ideation  Otherwise full review of systems performed by me is negative.   MEDICATIONS AT HOME:   Prior to Admission medications   Medication Sig Start Date End Date Taking? Authorizing Provider  LORazepam (ATIVAN) 1 MG tablet Take 1 tablet (1 mg total) by mouth 2 (two) times daily as needed for anxiety. 01/14/15  Yes Noland Fordyce, PA-C  gabapentin (NEURONTIN) 300 MG capsule Take 1 capsule (300 mg total) by mouth 3 (three) times daily. Patient not taking: Reported on 01/04/2015 11/26/14   Clarene Reamer, MD  HYDROcodone-acetaminophen [facility]) 5-325 MG per tablet Take 1-2 tablets by mouth every 4 (four) hours as needed for pain. Patient not taking: Reported on 11/24/2014 11/24/12   Hazel Sams, PA-C  lipase/protease/amylase (CREON) 36000 UNITS CPEP capsule Take 1 capsule (36,000 Units total) by mouth 3 (three) times daily before meals. Patient not taking: Reported on 03/01/2015 01/13/15   Max Sane, MD  mirtazapine (REMERON) 30 MG tablet Take 1 tablet (30 mg total) by mouth at bedtime. Patient not taking: Reported on 03/01/2015 01/13/15   Max Sane, MD      VITAL SIGNS:  Blood pressure 117/90, pulse 102, temperature 98.6 F (37 C), temperature source Oral, resp. rate 13, height 5' 6"  (1.676 m), weight 83 lb (37.649 kg), last menstrual period 02/16/2015, SpO2 96 %.  PHYSICAL EXAMINATION:  VITAL SIGNS: Filed Vitals:   03/01/15 2030  BP: 117/90  Pulse: 102  Temp:   Resp: 13   GENERAL:28 y.o.female currently in no acute distress. Week/frail appearing  HEAD: Normocephalic, atraumatic.  EYES: Pupils equal, round, reactive to light. Extraocular muscles intact. No scleral icterus.  MOUTH: Moist mucosal membrane. Dentition intact. No abscess noted.  EAR, NOSE, THROAT: Clear without exudates. No external lesions.  NECK: Supple. No thyromegaly. No nodules. No JVD.  PULMONARY: Clear to ascultation, without wheeze rails or rhonci. No use of  accessory muscles, Good respiratory effort. good air entry bilaterally CHEST: Nontender to palpation.  CARDIOVASCULAR: S1 and S2. Regular rate and rhythm. No murmurs, rubs, or gallops. No edema. Pedal pulses 2+ bilaterally.  GASTROINTESTINAL: Soft, nontender, nondistended. No masses. Positive bowel sounds. No hepatosplenomegaly. G-tube in place without surrounding erythema/discharge  MUSCULOSKELETAL: No swelling, clubbing, or edema. Range of motion full in all extremities.  NEUROLOGIC: Cranial nerves II through XII are intact. No gross focal neurological deficits. Sensation intact. Reflexes intact.  SKIN: No ulceration, lesions, rashes, or cyanosis. Skin warm and dry. Turgor intact.  PSYCHIATRIC: Mood, affectflattened. The patient is awake, alert and oriented x 3. Insight, judgment intact.    LABORATORY PANEL:   CBC  Recent Labs Lab 03/01/15 1551  WBC 6.3  HGB 15.9  HCT 46.9  PLT 156   ------------------------------------------------------------------------------------------------------------------  Chemistries   Recent Labs Lab 03/01/15 1551 03/01/15 1700  NA 134*  --   K 3.0*  --   CL 88*  --   CO2 32  --   GLUCOSE 107*  --   BUN 12  --   CREATININE 0.51  --   CALCIUM 8.5*  --   MG  --  1.9  AST 96*  --   ALT 132*  --   ALKPHOS 119  --   BILITOT 0.7  --    ------------------------------------------------------------------------------------------------------------------  Cardiac Enzymes  No results for input(s): TROPONINI in the last 168 hours. ------------------------------------------------------------------------------------------------------------------  RADIOLOGY:  Ct Abdomen Pelvis W Contrast  03/01/2015   CLINICAL DATA:  Weight loss.  Tachycardia.  Vomiting.  EXAM: CT ABDOMEN AND PELVIS WITH CONTRAST  TECHNIQUE: Multidetector CT imaging of the abdomen and pelvis was performed using the standard protocol following bolus administration of intravenous contrast.   CONTRAST:  140m OMNIPAQUE IOHEXOL 300 MG/ML  SOLN  COMPARISON:  03/01/2015; 01/04/2015  FINDINGS: Lower chest:  Unremarkable  Hepatobiliary: Diffuse hepatic steatosis. Mildly distended gallbladder with dilated common hepatic duct at 7 mm, and low origin of the cystic duct from the common bile duct.  Pancreas: Type 1 pancreas divisum.  Spleen: Unremarkable  Adrenals/Urinary Tract: Tiny amount of gas in the urinary bladder, query recent catheterization.  Stomach/Bowel: Gastrostomy tube. Appendix normal. Contracted proximal colon, questionable mucosal enhancement.  Vascular/Lymphatic: Unremarkable  Reproductive: Unremarkable  Other: Considerable paucity of adipose tissue.  Musculoskeletal: Unremarkable  IMPRESSION: 1. There is potential mucosal enhancement in the ascending colon which could reflect a low-level colitis. 2. Hepatic steatosis. 3. Type 1 pancreas divisum. 4. Mildly prominent gallbladder and continued prominence of the biliary tree, chronic, etiology uncertain. Reason sonography did not demonstrate cholelithiasis or choledocholithiasis.   Electronically Signed   By: WVan ClinesM.D.   On: 03/01/2015 19:43   Dg Abd Acute W/chest  03/01/2015   CLINICAL DATA:  Vomiting, shortness of Breath  EXAM: DG ABDOMEN ACUTE W/ 1V CHEST  COMPARISON:  01/04/2015  FINDINGS: Cardiomediastinal silhouette is unremarkable. No acute infiltrate or pleural effusion. No pulmonary edema. There is normal small bowel gas pattern. A percutaneous gastric feeding tube is noted.  IMPRESSION: Negative abdominal radiographs.  No acute cardiopulmonary disease.   Electronically Signed   By: LLahoma CrockerM.D.   On: 03/01/2015 16:54    EKG:   Orders placed or performed during the hospital encounter of 03/01/15  . ED EKG  . ED EKG    IMPRESSION AND PLAN:   29year old Caucasian female history of anxiety not otherwise specified as well as fever throughout status post G-tube insertion presenting with alcohol abuse, weight loss,  nausea/vomiting.  1. Intractable nausea/vomiting: IV fluid hydration, when necessary medications will consult gastroenterology as she is known to them and they referred her to the hospital 2. Hyponatremia: IV fluid hydration normal saline follow sodium levels 3. Hypokalemia: Replace potassium goal 4-5 4. Alcohol abuse: Last alcohol intake the day of admission however we'll place on CIWA protocol including folic acid and thiamine replacement 5. Failure to thrive: Consult gastroenterology for the further input she refuses to be seen by psychiatry at this point in time though this option was discussed in great detail 6. Venous thrombus embolism prophylactic: Heparin subcutaneous   All the records are reviewed and case discussed with ED provider. Management plans discussed with the patient, family and they are in agreement.  CODE STATUS: Full code  TOTAL TIME TAKING CARE OF THIS PATIENT: 45  minutes.    Brayli Klingbeil,  DKarenann CaiD on 03/01/2015 at 9:25 PM  Between 7am to 6pm - Pager - [phone]  After 6pm: House Pager: - [phone] ETyna JakschHospitalists  Office  [phone] CC: Primary care physician; HLeonides Sake MD

## 2015-03-01 NOTE — ED Notes (Signed)
Spoke with admitting, Dr. Lavetta Nielsen, about feeding tube. Nursing staff do not need to care for it. Receiving RN notified.

## 2015-03-02 ENCOUNTER — Encounter: Payer: Self-pay | Admitting: Urgent Care

## 2015-03-02 DIAGNOSIS — R1110 Vomiting, unspecified: Secondary | ICD-10-CM | POA: Insufficient documentation

## 2015-03-02 DIAGNOSIS — F1010 Alcohol abuse, uncomplicated: Secondary | ICD-10-CM

## 2015-03-02 DIAGNOSIS — R197 Diarrhea, unspecified: Secondary | ICD-10-CM | POA: Diagnosis not present

## 2015-03-02 DIAGNOSIS — R627 Adult failure to thrive: Secondary | ICD-10-CM | POA: Diagnosis not present

## 2015-03-02 LAB — CBC
HCT: 38 % (ref 35.0–47.0)
HEMOGLOBIN: 12.7 g/dL (ref 12.0–16.0)
MCH: 32.9 pg (ref 26.0–34.0)
MCHC: 33.5 g/dL (ref 32.0–36.0)
MCV: 98.1 fL (ref 80.0–100.0)
Platelets: 111 10*3/uL — ABNORMAL LOW (ref 150–440)
RBC: 3.87 MIL/uL (ref 3.80–5.20)
RDW: 14.2 % (ref 11.5–14.5)
WBC: 8.7 10*3/uL (ref 3.6–11.0)

## 2015-03-02 LAB — COMPREHENSIVE METABOLIC PANEL
ALBUMIN: 2.9 g/dL — AB (ref 3.5–5.0)
ALK PHOS: 92 U/L (ref 38–126)
ALT: 87 U/L — ABNORMAL HIGH (ref 14–54)
AST: 71 U/L — ABNORMAL HIGH (ref 15–41)
Anion gap: 8 (ref 5–15)
BUN: 12 mg/dL (ref 6–20)
CHLORIDE: 103 mmol/L (ref 101–111)
CO2: 26 mmol/L (ref 22–32)
Calcium: 7.3 mg/dL — ABNORMAL LOW (ref 8.9–10.3)
Creatinine, Ser: 0.54 mg/dL (ref 0.44–1.00)
GFR calc Af Amer: >60 mL/min (ref >60)
GFR calc non Af Amer: >60 mL/min (ref >60)
Glucose, Bld: 68 mg/dL (ref 65–99)
Potassium: 3.7 mmol/L (ref 3.5–5.1)
Sodium: 137 mmol/L (ref 135–145)
Total Bilirubin: 1.1 mg/dL (ref 0.3–1.2)
Total Protein: 5.6 g/dL — ABNORMAL LOW (ref 6.5–8.1)

## 2015-03-02 LAB — C DIFFICILE QUICK SCREEN W PCR REFLEX
C Diff antigen: NEGATIVE
C Diff interpretation: NEGATIVE
C Diff toxin: NEGATIVE

## 2015-03-02 MED ORDER — VITAMIN B-1 100 MG PO TABS
100.0000 mg | ORAL_TABLET | Freq: Every day | ORAL | Status: DC
  Administered 2015-03-02 – 2015-03-03 (×2): 100 mg via ORAL
  Filled 2015-03-02 (×2): qty 1

## 2015-03-02 MED ORDER — FOLIC ACID 1 MG PO TABS
1.0000 mg | ORAL_TABLET | Freq: Every day | ORAL | Status: DC
  Administered 2015-03-02 – 2015-03-03 (×2): 1 mg via ORAL
  Filled 2015-03-02 (×2): qty 1

## 2015-03-02 MED ORDER — PANTOPRAZOLE SODIUM 40 MG PO TBEC
40.0000 mg | DELAYED_RELEASE_TABLET | Freq: Every day | ORAL | Status: DC
  Administered 2015-03-02 – 2015-03-03 (×2): 40 mg via ORAL
  Filled 2015-03-02 (×2): qty 1

## 2015-03-02 NOTE — Care Management (Addendum)
Admitted to [facility] under observation status with the diagnosis intractable vomiting. A patient at this facility 01/14/15. Lives with her mother, Belenda Cruise [phone]). Followed by [facility] since last discharge. Floydene Flock, [facility] representative updated.  Sees Dr.Hamrick and Rein.  G-Tube in place. Clear liquid diet.  CIWA protocol. Shelbie Ammons RN MSN Care Management [phone]

## 2015-03-02 NOTE — Plan of Care (Signed)
Problem: Discharge Progression Outcomes Goal: Discharge plan in place and appropriate Outcome: Progressing Patient is a Moderate Fall Risk History of anxiety, alcohol Abuse, continue with home meds. Patient is on CIWA scale Patient has a G tube placed 1 month ago.    Goal: Other Discharge Outcomes/Goals Outcome: Progressing Patient is alert and oriented, no c/o pain at this time. Scored on the CIWA scale a 15, 2 mg Ativan given with improvement. Independent in room, pleasant. Patient states last drink was the morning of July 5th and drinks 6 pack a day.

## 2015-03-02 NOTE — Consult Note (Signed)
Gastroenterology Consultation  Referring Provider: Dr Lavetta Nielsen Primary Care Physician:  Leonides Sake, MD Primary Gastroenterologist:  Dr. Rayann Heman     Reason for Consultation:     Nausea, vomiting & failure to thrive  Date of Admission:  03/01/2015 Date of Consultation:  03/02/2015        HPI:   Kristine Macias is a 29 y.o. female admitted with history of anxiety, failure to thrive status post G-tube insertion approximately 6 weeks ago by Dr Rayann Heman, & alcohol abuse presenting with nausea, vomiting, & weight loss.  She has had increased stress/anxiety.  She has been binge drinking approximately 10 days ago. She tells me she was drinking 6 beers per day. She has stopped taking her Ativan for anxiety as well.   She has had nausea & vomiting nonbloody nonbilious emesis.  She has had epigastric "pain" intensity 5/10 and no worsening or relieving factors nonradiating. She has been using 3 bolus feedings per day per PEG tube. She reports a 10 pound weight gain in the last 6 weeks. She has had treatment for her alcohol abuse and had been clean for 6 months prior to her relapse. She tells me she has a Social worker that she talks with over the phone on a daily basis. She tells me she could not keep anything down prior to her admission. She was vomiting both her bolus feeds and anything she took by mouth. She has left lower quadrant pain just below the PEG tube which is worse with coughing. Pain is 2/10 on pain scale. She has had loose diarrheal stools for the past 10 days. She's having approximately 5 stools per day there are nonbloody. She has noticed increased mucus in her stools. She had mild chronic active duodenitis and active ileitis with focal erosions on her last colonoscopy by Dr. Rayann Heman. IBD panel was sent out but unfortunately I did not have results. She called her gastroenterologist Dr. Rayann Heman & was advised to present to Hospital for further workup.  CT scan reviewed as below and shows a low level colitis in the  ascending colon and chronic biliary prominence and type I pancreas divisum.  Denies any foreign travel, new medications, recent antibiotic, or interaction with livestock or new pets.  Past Medical History  Diagnosis Date  . Anxiety   . Chlamydia 2007  . Irregular heart beat 2010    wt/diet related after evaluation  . Renal disorder   . Pneumothorax, spontaneous, tension   . Alcohol abuse   . Pancreas divisum   . Failure to thrive in adult     Past Surgical History  Procedure Laterality Date  . Cesarean section    . Chest tube insertion    . Esophagogastroduodenoscopy N/A 01/06/2015    Procedure: ESOPHAGOGASTRODUODENOSCOPY (EGD);  Surgeon: Josefine Class, MD;  Location: [facility] ENDOSCOPY;  Service: Endoscopy;  Laterality: N/A;  . Colonoscopy with propofol N/A 01/06/2015    Procedure: COLONOSCOPY WITH PROPOFOL;  Surgeon: Josefine Class, MD;  Location: [facility] ENDOSCOPY;  Service: Endoscopy;  Laterality: N/A;  . Peg placement N/A 01/11/2015    Procedure: PERCUTANEOUS ENDOSCOPIC GASTROSTOMY (PEG) PLACEMENT;  Surgeon: Josefine Class, MD;  Location: [facility] ENDOSCOPY;  Service: Endoscopy;  Laterality: N/A;  . Esophagogastroduodenoscopy N/A 01/11/2015    Rein-normal with PEG placement    Prior to Admission medications   Medication Sig Start Date End Date Taking? Authorizing Provider  LORazepam (ATIVAN) 1 MG tablet Take 1 tablet (1 mg total) by mouth 2 (two) times daily  as needed for anxiety. 01/14/15  Yes Noland Fordyce, PA-C  gabapentin (NEURONTIN) 300 MG capsule Take 1 capsule (300 mg total) by mouth 3 (three) times daily. Patient not taking: Reported on 01/04/2015 11/26/14   Clarene Reamer, MD  HYDROcodone-acetaminophen [facility]) 5-325 MG per tablet Take 1-2 tablets by mouth every 4 (four) hours as needed for pain. Patient not taking: Reported on 11/24/2014 11/24/12   Hazel Sams, PA-C  lipase/protease/amylase (CREON) 36000 UNITS CPEP capsule Take 1 capsule (36,000 Units total) by mouth 3  (three) times daily before meals. Patient not taking: Reported on 03/01/2015 01/13/15   Max Sane, MD  mirtazapine (REMERON) 30 MG tablet Take 1 tablet (30 mg total) by mouth at bedtime. Patient not taking: Reported on 03/01/2015 01/13/15   Max Sane, MD    Family History  Problem Relation Age of Onset  . Anesthesia problems Neg Hx   . Thyroid disease Mother   . Cancer Father   . Stroke Other   . Crohn's disease Brother      History   Social History Narrative   Lives with mother & 2 children   disabled   History  Substance Use Topics  . Smoking status: Current Every Day Smoker -- 0.25 packs/day    Types: Cigarettes  . Smokeless tobacco: Never Used  . Alcohol Use: 21.6 oz/week    36 Cans of beer per week     Comment: 6 beers/day    Allergies as of 03/01/2015 - Review Complete 03/01/2015  Allergen Reaction Noted  . Effexor [venlafaxine] Anaphylaxis 01/13/2015  . Sulfa antibiotics Anaphylaxis 08/31/2011  . Tetracyclines & related Anaphylaxis 08/31/2011  . Trazodone and nefazodone Anaphylaxis 01/13/2015  . Latex Hives, Itching, and Rash 08/31/2011  . Paxil [paroxetine] Hives, Itching, and Rash 01/13/2015  . Seroquel [quetiapine fumarate] Hives, Itching, and Rash 01/13/2015  . Zoloft [sertraline hcl] Hives, Itching, and Rash 01/13/2015    Review of Systems:    All systems reviewed and negative except where noted in HPI.   Physical Exam:  Vital signs in last 24 hours: Temp:  [98.3 F (36.8 C)-99.1 F (37.3 C)] 98.3 F (36.8 C) (07/06 0815) Pulse Rate:  [74-112] 74 (07/06 0815) Resp:  [13-21] 16 (07/06 0815) BP: (93-125)/(58-94) 98/58 mmHg (07/06 0815) SpO2:  [93 %-100 %] 100 % (07/06 0815) Weight:  [83 lb (37.649 kg)-92 lb 8 oz (41.958 kg)] 92 lb 8 oz (41.958 kg) (07/05 2204) Last BM Date: 03/01/15 Body mass index is 14.94 kg/(m^2). General:   Alert,  Well-developed, thin, pleasant and cooperative in NAD Head:  Normocephalic and atraumatic. Eyes:  Sclera clear, no  icterus.   Conjunctiva pink. Ears:  Normal auditory acuity. Nose:  No deformity, discharge, or lesions. Mouth:  No deformity or lesions,oropharynx pink & moist. Neck:  Supple; no masses or thyromegaly. Lungs:  Respirations even and unlabored.  Clear throughout to auscultation.   No wheezes, crackles, or rhonchi. No acute distress. Heart:  Regular rate and rhythm; no murmurs, clicks, rubs, or gallops. Abdomen:  + PEG to left abdomen. Site clear. No exudates or skin changes. Normal bowel sounds.  No bruits.  Soft, non-tender and non-distended without masses, hepatosplenomegaly or hernias noted.  No guarding or rebound tenderness.   Rectal:  Deferred.  Msk:  Symmetrical without gross deformities.  Good, equal movement & strength bilaterally. Pulses:  Normal pulses noted. Extremities:  No clubbing or edema.  No cyanosis. Neurologic:  Alert and oriented x3;  grossly normal neurologically. Skin:  Intact without  significant lesions or rashes.  No jaundice. Lymph Nodes:  No significant cervical adenopathy. Psych:  Alert and cooperative. Normal mood and affect.  LAB RESULTS:  Recent Labs  03/01/15 1551 03/02/15 0419  WBC 6.3 8.7  HGB 15.9 12.7  HCT 46.9 38.0  PLT 156 111*   BMET  Recent Labs  03/01/15 1551 03/02/15 0419  NA 134* 137  K 3.0* 3.7  CL 88* 103  CO2 32 26  GLUCOSE 107* 68  BUN 12 12  CREATININE 0.51 0.54  CALCIUM 8.5* 7.3*   LFT  Recent Labs  03/01/15 1551 03/02/15 0419  PROT 7.4 5.6*  ALBUMIN 3.8 2.9*  AST 96* 71*  ALT 132* 87*  ALKPHOS 119 92  BILITOT 0.7 1.1   STUDIES: Ct Abdomen Pelvis W Contrast  03/01/2015   CLINICAL DATA:  Weight loss.  Tachycardia.  Vomiting.  EXAM: CT ABDOMEN AND PELVIS WITH CONTRAST  TECHNIQUE: Multidetector CT imaging of the abdomen and pelvis was performed using the standard protocol following bolus administration of intravenous contrast.  CONTRAST:  155m OMNIPAQUE IOHEXOL 300 MG/ML  SOLN  COMPARISON:  03/01/2015; 01/04/2015   FINDINGS: Lower chest:  Unremarkable  Hepatobiliary: Diffuse hepatic steatosis. Mildly distended gallbladder with dilated common hepatic duct at 7 mm, and low origin of the cystic duct from the common bile duct.  Pancreas: Type 1 pancreas divisum.  Spleen: Unremarkable  Adrenals/Urinary Tract: Tiny amount of gas in the urinary bladder, query recent catheterization.  Stomach/Bowel: Gastrostomy tube. Appendix normal. Contracted proximal colon, questionable mucosal enhancement.  Vascular/Lymphatic: Unremarkable  Reproductive: Unremarkable  Other: Considerable paucity of adipose tissue.  Musculoskeletal: Unremarkable  IMPRESSION: 1. There is potential mucosal enhancement in the ascending colon which could reflect a low-level colitis. 2. Hepatic steatosis. 3. Type 1 pancreas divisum. 4. Mildly prominent gallbladder and continued prominence of the biliary tree, chronic, etiology uncertain. Reason sonography did not demonstrate cholelithiasis or choledocholithiasis.   Electronically Signed   By: WVan ClinesM.D.   On: 03/01/2015 19:43   Dg Abd Acute W/chest  03/01/2015   CLINICAL DATA:  Vomiting, shortness of Breath  EXAM: DG ABDOMEN ACUTE W/ 1V CHEST  COMPARISON:  01/04/2015  FINDINGS: Cardiomediastinal silhouette is unremarkable. No acute infiltrate or pleural effusion. No pulmonary edema. There is normal small bowel gas pattern. A percutaneous gastric feeding tube is noted.  IMPRESSION: Negative abdominal radiographs.  No acute cardiopulmonary disease.   Electronically Signed   By: LLahoma CrockerM.D.   On: 03/01/2015 16:54     Impression / Plan:   SIlia Dimaanois a 29y.o. y/o female with recurrent nausea, vomiting, failure to thrive status PEG placement by Dr. RRayann Heman6 weeks ago.  She has possible changes of colitis on recent CT as well as recent colonoscopy. IBD workup is pending through Dr. RJackalyn Lombardoffice. She has changes of possible pancreas divisum on CT as well.   She has had chronic diarrhea. She  realizes her binge drinking is a problem.  She feels better today and is wanting to advance her diet.  Differentials include gastritis, inflammatory bowel disease or an acute infectious enteritis.   Plan: #1 agree with supportive measures #2 PPI daily #3 full set of stool studies #4 she should follow-up with Dr. RRayann Hemanfor completion of IBD workup #5 advance diet as tolerated and begin water PEG flushes. She should avoid lactose. #6 recommend ETOH counseling  Thank you for involving me in the care of this patient.  The care of SAnajah  Valrie Jia will be discussed in direct collaboration with Dr Lucilla Lame, Attending Gastroenterologist.    Vickey Huger, NP  03/02/2015, 11:23 AM [facility]  [place], [place] [address] Phone: [phone] Fax : [phone]  12:08 PM Addendum:  Dr Rayann Heman will take over patient's care this hospitalization per Dr Allen Norris.  Vickey Huger, NP

## 2015-03-02 NOTE — Progress Notes (Signed)
[place] at [place] NAME: Kristine Macias    MR#:  [id number]  DATE OF BIRTH:  08-May-1986  SUBJECTIVE:  CHIEF COMPLAINT:   Chief Complaint  Patient presents with  . Weight Loss   Nausea and vomiting is better. Still diarrhea. REVIEW OF SYSTEMS:  CONSTITUTIONAL: No fever, poor appetite and generalized weakness.  EYES: No blurred or double vision.  EARS, NOSE, AND THROAT: No tinnitus or ear pain.  RESPIRATORY: No cough, shortness of breath, wheezing or hemoptysis.  CARDIOVASCULAR: No chest pain, orthopnea, edema.  GASTROINTESTINAL: positive nausea, vomiting, diarrhea, no abdominal pain.  GENITOURINARY: No dysuria, hematuria.  ENDOCRINE: No polyuria, nocturia,  HEMATOLOGY: No anemia, easy bruising or bleeding SKIN: No rash or lesion. MUSCULOSKELETAL: No joint pain or arthritis.   NEUROLOGIC: No tingling, numbness, weakness.  PSYCHIATRY: No anxiety or depression.   DRUG ALLERGIES:   Allergies  Allergen Reactions  . Effexor [Venlafaxine] Anaphylaxis  . Sulfa Antibiotics Anaphylaxis  . Tetracyclines & Related Anaphylaxis  . Trazodone And Nefazodone Anaphylaxis  . Latex Hives, Itching and Rash  . Paxil [Paroxetine] Hives, Itching and Rash  . Seroquel [Quetiapine Fumarate] Hives, Itching and Rash  . Zoloft [Sertraline Hcl] Hives, Itching and Rash    VITALS:  Blood pressure 105/67, pulse 91, temperature 98.1 F (36.7 C), temperature source Oral, resp. rate 20, height 5' 6"  (1.676 m), weight 41.958 kg (92 lb 8 oz), last menstrual period 02/16/2015, SpO2 100 %.  PHYSICAL EXAMINATION:  GENERAL:  29 y.o.-year-old patient lying in the bed with no acute distress.  EYES: Pupils equal, round, reactive to light and accommodation. No scleral icterus. Extraocular muscles intact.  HEENT: Head atraumatic, normocephalic. Oropharynx and nasopharynx clear.  NECK:  Supple, no jugular venous distention. No thyroid enlargement, no tenderness.   LUNGS: Normal breath sounds bilaterally, no wheezing, rales,rhonchi or crepitation. No use of accessory muscles of respiration.  CARDIOVASCULAR: S1, S2 normal. No murmurs, rubs, or gallops.  ABDOMEN: Soft, nontender, nondistended. Bowel sounds present. No organomegaly or mass. PEG in situ.  EXTREMITIES: No pedal edema, cyanosis, or clubbing.  NEUROLOGIC: Cranial nerves II through XII are intact. Muscle strength 5/5 in all extremities. Sensation intact. Gait not checked.  PSYCHIATRIC: The patient is alert and oriented x 3.  SKIN: No obvious rash, lesion, or ulcer.    LABORATORY PANEL:   CBC  Recent Labs Lab 03/02/15 0419  WBC 8.7  HGB 12.7  HCT 38.0  PLT 111*   ------------------------------------------------------------------------------------------------------------------  Chemistries   Recent Labs Lab 03/01/15 1700 03/02/15 0419  NA  --  137  K  --  3.7  CL  --  103  CO2  --  26  GLUCOSE  --  68  BUN  --  12  CREATININE  --  0.54  CALCIUM  --  7.3*  MG 1.9  --   AST  --  71*  ALT  --  87*  ALKPHOS  --  92  BILITOT  --  1.1   ------------------------------------------------------------------------------------------------------------------  Cardiac Enzymes No results for input(s): TROPONINI in the last 168 hours. ------------------------------------------------------------------------------------------------------------------  RADIOLOGY:  Ct Abdomen Pelvis W Contrast  03/01/2015   CLINICAL DATA:  Weight loss.  Tachycardia.  Vomiting.  EXAM: CT ABDOMEN AND PELVIS WITH CONTRAST  TECHNIQUE: Multidetector CT imaging of the abdomen and pelvis was performed using the standard protocol following bolus administration of intravenous contrast.  CONTRAST:  132m OMNIPAQUE IOHEXOL 300 MG/ML  SOLN  COMPARISON:  03/01/2015;  01/04/2015  FINDINGS: Lower chest:  Unremarkable  Hepatobiliary: Diffuse hepatic steatosis. Mildly distended gallbladder with dilated common hepatic duct at  7 mm, and low origin of the cystic duct from the common bile duct.  Pancreas: Type 1 pancreas divisum.  Spleen: Unremarkable  Adrenals/Urinary Tract: Tiny amount of gas in the urinary bladder, query recent catheterization.  Stomach/Bowel: Gastrostomy tube. Appendix normal. Contracted proximal colon, questionable mucosal enhancement.  Vascular/Lymphatic: Unremarkable  Reproductive: Unremarkable  Other: Considerable paucity of adipose tissue.  Musculoskeletal: Unremarkable  IMPRESSION: 1. There is potential mucosal enhancement in the ascending colon which could reflect a low-level colitis. 2. Hepatic steatosis. 3. Type 1 pancreas divisum. 4. Mildly prominent gallbladder and continued prominence of the biliary tree, chronic, etiology uncertain. Reason sonography did not demonstrate cholelithiasis or choledocholithiasis.   Electronically Signed   By: Van Clines M.D.   On: 03/01/2015 19:43   Dg Abd Acute W/chest  03/01/2015   CLINICAL DATA:  Vomiting, shortness of Breath  EXAM: DG ABDOMEN ACUTE W/ 1V CHEST  COMPARISON:  01/04/2015  FINDINGS: Cardiomediastinal silhouette is unremarkable. No acute infiltrate or pleural effusion. No pulmonary edema. There is normal small bowel gas pattern. A percutaneous gastric feeding tube is noted.  IMPRESSION: Negative abdominal radiographs.  No acute cardiopulmonary disease.   Electronically Signed   By: Lahoma Crocker M.D.   On: 03/01/2015 16:54    EKG:   Orders placed or performed during the hospital encounter of 03/01/15  . ED EKG  . ED EKG  . EKG 12-Lead  . EKG 12-Lead    ASSESSMENT AND PLAN:   1. Intractable nausea/vomiting: possible colitis. better.  Cont IV fluid hydration, zofran prn, fPPI daily, f/u stool studies. advance diet as tolerated and begin water PEG flushes. She should avoid lactose per GI consult.  2. Hyponatremia: Improved with normal saline.  3. Hypokalemia: Replaced and improved.  4. Alcohol abuse: on CIWA protocol.  5. Failure to  thrive: encourage oral intake.  advance diet as tolerated and begin water PEG flushes.   All the records are reviewed and case discussed with Care Management/Social Workerr. Management plans discussed with the patient, family and they are in agreement.  CODE STATUS: FULL CODE  TOTAL TIME TAKING CARE OF THIS PATIENT: 36  minutes.   POSSIBLE D/C IN 1-2 DAYS, DEPENDING ON CLINICAL CONDITION.   Demetrios Loll M.D on 03/02/2015 at 1:25 PM  Between 7am to 6pm - Pager - [phone]  After 6pm go to [email or web address] - password [email or web address]  [facility]  Office  [phone]  CC: Primary care physician; Leonides Sake, MD

## 2015-03-02 NOTE — Plan of Care (Addendum)
Problem: Discharge Progression Outcomes Goal: Activity appropriate for discharge plan Outcome: Progressing Patient has tolerated diet today, VSS, covered with ativan twice for positive CIWA scale. Patient denies any pain, no nausea/vomiting today. C Diff negative no longer on precautions.  All stool samples have been collected.  GI seen patient today and advanced diet.  Small amount of loose brown stool three times today.

## 2015-03-03 ENCOUNTER — Observation Stay: Payer: Medicaid Other

## 2015-03-03 LAB — OVA AND PARASITE EXAMINATION

## 2015-03-03 MED ORDER — THIAMINE HCL 100 MG PO TABS: 100.0000 mg | ORAL_TABLET | Freq: Every day | ORAL | Status: DC

## 2015-03-03 MED ORDER — FOLIC ACID 1 MG PO TABS: 1.0000 mg | ORAL_TABLET | Freq: Every day | ORAL | Status: DC

## 2015-03-03 NOTE — Plan of Care (Signed)
Problem: Discharge Progression Outcomes Goal: Discharge plan in place and appropriate Individualism  Outcome: Progressing Pt prefers to be called Lovey Newcomer. On CIWA precautions. Pt recently lost a lot of weight, cut back on her tube feeds and binged drank for 2 weeks.    Goal: Other Discharge Outcomes/Goals Outcome: Progressing Plan of care progress to goal: Hemodynamically stable - vitals stable this shift Complications - pt being monitored under CIWA precautions, ativan given as needed Diet - pt on soft diet Activity - pt up ad lib

## 2015-03-03 NOTE — Discharge Instructions (Signed)
Regular diet. Activity as tolerated. Alcohol and Nutrition Nutrition serves two purposes. It provides energy. It also maintains body structure and function. Food supplies energy. It also provides the building blocks needed to replace worn or damaged cells. Alcoholics often eat poorly. This limits their supply of essential nutrients. This affects energy supply and structure maintenance. Alcohol also affects the body's nutrients in:  Digestion.  Storage.  Using and getting rid of waste products. IMPAIRMENT OF NUTRIENT DIGESTION AND UTILIZATION   Once ingested, food must be broken down into small components (digested). Then it is available for energy. It helps maintain body structure and function. Digestion begins in the mouth. It continues in the stomach and intestines, with help from the pancreas. The nutrients from digested food are absorbed from the intestines into the blood. Then they are carried to the liver. The liver prepares nutrients for:  Immediate use.  Storage and future use.  Alcohol inhibits the breakdown of nutrients into usable molecules.  It decreases secretion of digestive enzymes from the pancreas.  Alcohol impairs nutrient absorption by damaging the cells lining the stomach and intestines.  It also interferes with moving some nutrients into the blood.  In addition, nutritional deficiencies themselves may lead to further absorption problems.  For example, folate deficiency changes the cells that line the small intestine. This impairs how water is absorbed. It also affects absorbed nutrients. These include glucose, sodium, and additional folate.  Even if nutrients are digested and absorbed, alcohol can prevent them from being fully used. It changes their transport, storage, and excretion. Impaired utilization of nutrients by alcoholics is indicated by:  Decreased liver stores of vitamins, such as vitamin A.  Increased excretion of nutrients such as fat. ALCOHOL AND  ENERGY SUPPLY   Three basic nutritional components found in food are:  Carbohydrates.  Proteins.  Fats.  These are used as energy. Some alcoholics take in as much as 50% of their total daily calories from alcohol. They often neglect important foods.  Even when enough food is eaten, alcohol can impair the ways the body controls blood sugar (glucose) levels. It may either increase or decrease blood sugar.  In non-diabetic alcoholics, increased blood sugar (hyperglycemia) is caused by poor insulin secretion. It is usually temporary.  Decreased blood sugar (hypoglycemia) can cause serious injury even if this condition is short-lived. Low blood sugar can happen when a fasting or malnourished person drinks alcohol. When there is no food to supply energy, stored sugar is used up. The products of alcohol inhibit forming glucose from other compounds such as amino acids. As a result, alcohol causes the brain and other body tissue to lack glucose. It is needed for energy and function.  Alcohol is an energy source. But how the body processes and uses the energy from alcohol is complex. Also, when alcohol is substituted for carbohydrates, subjects tend to lose weight. This indicates that they get less energy from alcohol than from food. ALCOHOL - MAINTAINING CELL STRUCTURE AND FUNCTION  Structure Cells are made mostly of protein. So an adequate protein diet is important for maintaining cell structure. This is especially true if cells are being damaged. Research indicates that alcohol affects protein nutrition by causing impaired:  Digestion of proteins to amino acids.  Processing of amino acids by the small intestine and liver.  Synthesis of proteins from amino acids.  Protein secretion by the liver. Function Nutrients are essential for the body to function well. They provide the tools that the body needs  to work well:   Proteins.  Vitamins.  Minerals. Alcohol can disrupt body function. It  may cause nutrient deficiencies. And it may interfere with the way nutrients are processed. Vitamins  Vitamins are essential to maintain growth and normal metabolism. They regulate many of the body`s processes. Chronic heavy drinking causes deficiencies in many vitamins. This is caused by eating less. And, in some cases, vitamins may be poorly absorbed. For example, alcohol inhibits fat absorption. It impairs how the vitamins A, E, and D are normally absorbed along with dietary fats. Not enough vitamin A may cause night blindness. Not enough vitamin D may cause softening of the bones.  Some alcoholics lack vitamins A, C, D, E, K, and the B vitamins. These are all involved in wound healing and cell maintenance. In particular, because vitamin K is necessary for blood clotting, lacking that vitamin can cause delayed clotting. The result is excess bleeding. Lacking other vitamins involved in brain function may cause severe neurological damage. Minerals Deficiencies of minerals such as calcium, magnesium, iron, and zinc are common in alcoholics. The alcohol itself does not seem to affect how these minerals are absorbed. Rather, they seem to occur secondary to other alcohol-related problems, such as:  Less calcium absorbed.  Not enough magnesium.  More urinary excretion.  Vomiting.  Diarrhea.  Not enough iron due to gastrointestinal bleeding.  Not enough zinc or losses related to other nutrient deficiencies.  Mineral deficiencies can cause a variety of medical consequences. These range from calcium-related bone disease to zinc-related night blindness and skin lesions. ALCOHOL, MALNUTRITION, AND MEDICAL COMPLICATIONS  Liver Disease   Alcoholic liver damage is caused primarily by alcohol itself. But poor nutrition may increase the risk of alcohol-related liver damage. For example, nutrients normally found in the liver are known to be affected by drinking alcohol. These include carotenoids, which  are the major sources of vitamin A, and vitamin E compounds. Decreases in such nutrients may play some role in alcohol-related liver damage. Pancreatitis  Research suggests that malnutrition may increase the risk of developing alcoholic pancreatitis. Research suggests that a diet lacking in protein may increase alcohol's damaging effect on the pancreas. Brain  Nutritional deficiencies may have severe effects on brain function. These may be permanent. Specifically, thiamine deficiencies are often seen in alcoholics. They can cause severe neurological problems. These include:  Impaired movement.  Memory loss seen in Wernicke-Korsakoff syndrome. Pregnancy  Alcohol has toxic effects on fetal development. It causes alcohol-related birth defects. They include fetal alcohol syndrome. Alcohol itself is toxic to the fetus. Also, the nutritional deficiency can affect how the fetus develops. That may compound the risk of developmental damage.  Nutritional needs during pregnancy are 10% to 30% greater than normal. Food intake can increase by as much as 140% to cover the needs of both mother and fetus. An alcoholic mother`s nutritional problems may adversely affect the nutrition of the fetus. And alcohol itself can also restrict nutrition flow to the fetus. NUTRITIONAL STATUS OF ALCOHOLICS  Techniques for assessing nutritional status include:  Taking body measurements to estimate fat reserves. They include:  Weight.  Height.  Mass.  Skin fold thickness.  Performing blood analysis to provide measurements of circulating:  Proteins.  Vitamins.  Minerals.  These techniques tend to be imprecise. For many nutrients, there is no clear "cut-off" point that would allow an accurate definition of deficiency. So assessing the nutritional status of alcoholics is limited by these techniques. Dietary status may provide information about the  risk of developing nutritional problems. Dietary status is assessed  by:  Taking patients' dietary histories.  Evaluating the amount and types of food they are eating.  It is difficult to determine what exact amount of alcohol begins to have damaging effects on nutrition. In general, moderate drinkers have 2 drinks or less per day. They seem to be at little risk for nutritional problems. Various medical disorders begin to appear at greater levels.  Research indicates that the majority of even the heaviest drinkers have few obvious nutritional deficiencies. Many alcoholics who are hospitalized for medical complications of their disease do have severe malnutrition. Alcoholics tend to eat poorly. Often they eat less than the amounts of food necessary to provide enough:  Carbohydrates.  Protein.  Fat.  Vitamins A and C.  B vitamins.  Minerals like calcium and iron. Of major concern is alcohol's effect on digesting food and use of nutrients. It may shift a mildly malnourished person toward severe malnutrition. Document Released: 06/07/2005 Document Revised: 11/05/2011 Document Reviewed: 11/21/2005 [facility] Patient Information 2015 [place], [place]. This information is not intended to replace advice given to you by your health care provider. Make sure you discuss any questions you have with your health care provider.  Alcohol Use Disorder Alcohol use disorder is a mental disorder. It is not a one-time incident of heavy drinking. Alcohol use disorder is the excessive and uncontrollable use of alcohol over time that leads to problems with functioning in one or more areas of daily living. People with this disorder risk harming themselves and others when they drink to excess. Alcohol use disorder also can cause other mental disorders, such as mood and anxiety disorders, and serious physical problems. People with alcohol use disorder often misuse other drugs.  Alcohol use disorder is common and widespread. Some people with this disorder drink alcohol to cope with or  escape from negative life events. Others drink to relieve chronic pain or symptoms of mental illness. People with a family history of alcohol use disorder are at higher risk of losing control and using alcohol to excess.  SYMPTOMS  Signs and symptoms of alcohol use disorder may include the following:   Consumption ofalcohol inlarger amounts or over a longer period of time than intended.  Multiple unsuccessful attempts to cutdown or control alcohol use.   A great deal of time spent obtaining alcohol, using alcohol, or recovering from the effects of alcohol (hangover).  A strong desire or urge to use alcohol (cravings).   Continued use of alcohol despite problems at work, school, or home because of alcohol use.   Continued use of alcohol despite problems in relationships because of alcohol use.  Continued use of alcohol in situations when it is physically hazardous, such as driving a car.  Continued use of alcohol despite awareness of a physical or psychological problem that is likely related to alcohol use. Physical problems related to alcohol use can involve the brain, heart, liver, stomach, and intestines. Psychological problems related to alcohol use include intoxication, depression, anxiety, psychosis, delirium, and dementia.   The need for increased amounts of alcohol to achieve the same desired effect, or a decreased effect from the consumption of the same amount of alcohol (tolerance).  Withdrawal symptoms upon reducing or stopping alcohol use, or alcohol use to reduce or avoid withdrawal symptoms. Withdrawal symptoms include:  Racing heart.  Hand tremor.  Difficulty sleeping.  Nausea.  Vomiting.  Hallucinations.  Restlessness.  Seizures. DIAGNOSIS Alcohol use disorder is diagnosed through an  assessment by your health care provider. Your health care provider may start by asking three or four questions to screen for excessive or problematic alcohol use. To confirm  a diagnosis of alcohol use disorder, at least two symptoms must be present within a 47-monthperiod. The severity of alcohol use disorder depends on the number of symptoms:  Mild--two or three.  Moderate--four or five.  Severe--six or more. Your health care provider may perform a physical exam or use results from lab tests to see if you have physical problems resulting from alcohol use. Your health care provider may refer you to a mental health professional for evaluation. TREATMENT  Some people with alcohol use disorder are able to reduce their alcohol use to low-risk levels. Some people with alcohol use disorder need to quit drinking alcohol. When necessary, mental health professionals with specialized training in substance use treatment can help. Your health care provider can help you decide how severe your alcohol use disorder is and what type of treatment you need. The following forms of treatment are available:   Detoxification. Detoxification involves the use of prescription medicines to prevent alcohol withdrawal symptoms in the first week after quitting. This is important for people with a history of symptoms of withdrawal and for heavy drinkers who are likely to have withdrawal symptoms. Alcohol withdrawal can be dangerous and, in severe cases, cause death. Detoxification is usually provided in a hospital or in-patient substance use treatment facility.  Counseling or talk therapy. Talk therapy is provided by substance use treatment counselors. It addresses the reasons people use alcohol and ways to keep them from drinking again. The goals of talk therapy are to help people with alcohol use disorder find healthy activities and ways to cope with life stress, to identify and avoid triggers for alcohol use, and to handle cravings, which can cause relapse.  Medicines.Different medicines can help treat alcohol use disorder through the following actions:  Decrease alcohol cravings.  Decrease  the positive reward response felt from alcohol use.  Produce an uncomfortable physical reaction when alcohol is used (aversion therapy).  Support groups. Support groups are run by people who have quit drinking. They provide emotional support, advice, and guidance. These forms of treatment are often combined. Some people with alcohol use disorder benefit from intensive combination treatment provided by specialized substance use treatment centers. Both inpatient and outpatient treatment programs are available. Document Released: 09/20/2004 Document Revised: 12/28/2013 Document Reviewed: 11/20/2012 [facility] Information 2015 [place] This information is not intended to replace advice given to you by your health care provider. Make sure you discuss any questions you have with your health care provider.  Nausea and Vomiting Nausea means you feel sick to your stomach. Throwing up (vomiting) is a reflex where stomach contents come out of your mouth. HOME CARE   Take medicine as told by your doctor.  Do not force yourself to eat. However, you do need to drink fluids.  If you feel like eating, eat a normal diet as told by your doctor.  Eat rice, wheat, potatoes, bread, lean meats, yogurt, fruits, and vegetables.  Avoid high-fat foods.  Drink enough fluids to keep your pee (urine) clear or pale yellow.  Ask your doctor how to replace body fluid losses (rehydrate). Signs of body fluid loss (dehydration) include:  Feeling very thirsty.  Dry lips and mouth.  Feeling dizzy.  Dark pee.  Peeing less than normal.  Feeling confused.  Fast breathing or heart rate. GET HELP RIGHT  AWAY IF:   You have blood in your throw up.  You have black or bloody poop (stool).  You have a bad headache or stiff neck.  You feel confused.  You have bad belly (abdominal) pain.  You have chest pain or trouble breathing.  You do not pee at least once every 8 hours.  You have cold,  clammy skin.  You keep throwing up after 24 to 48 hours.  You have a fever. MAKE SURE YOU:   Understand these instructions.  Will watch your condition.  Will get help right away if you are not doing well or get worse. Document Released: 01/30/2008 Document Revised: 11/05/2011 Document Reviewed: 01/12/2011 [facility] Patient Information 2015 [place], [place]. This information is not intended to replace advice given to you by your health care provider. Make sure you discuss any questions you have with your health care provider.

## 2015-03-03 NOTE — Discharge Summary (Signed)
[facility] NAME: Kristine Macias    MR#:  [id number]  DATE OF BIRTH:  [birth year]  DATE OF ADMISSION:  03/01/2015 ADMITTING PHYSICIAN: Lytle Butte, MD  DATE OF DISCHARGE: No discharge date for patient encounter.  PRIMARY CARE PHYSICIAN: HAMRICK,MAURA L, MD    ADMISSION DIAGNOSIS:  Alcohol abuse [F10.10] Protein-calorie malnutrition, severe [E43] Vomiting and diarrhea [R11.10, R19.7] Alcohol intoxication, uncomplicated [Q76.195]   DISCHARGE DIAGNOSIS:  Intractable nausea/vomiting: possible colitis Hyponatremia Alcohol abuse  SECONDARY DIAGNOSIS:   Past Medical History  Diagnosis Date  . Anxiety   . Chlamydia 2007  . Irregular heart beat 2010    wt/diet related after evaluation  . Renal disorder   . Pneumothorax, spontaneous, tension   . Alcohol abuse   . Pancreas divisum   . Failure to thrive in adult     HOSPITAL COURSE:   1. Intractable nausea/vomiting: possible colitis.  She has been treated with IV fluid hydration, zofran prn, PPI daily. Her symptoms have much improved. She tolerated diet.   2. Hyponatremia: Improved with normal saline.  3. Hypokalemia: Replaced and improved.  4. Alcohol abuse: on CIWA protocol. No signs of withdrawal.  5. Failure to thrive: encourage oral intake.She tolerated diet.Marland Kitchen   DISCHARGE CONDITIONS:   Stable. Discharge to home and resume home health.  CONSULTS OBTAINED:  Treatment Team:  Lytle Butte, MD Gonzella Lex, MD  DRUG ALLERGIES:   Allergies  Allergen Reactions  . Effexor [Venlafaxine] Anaphylaxis  . Sulfa Antibiotics Anaphylaxis  . Tetracyclines & Related Anaphylaxis  . Trazodone And Nefazodone Anaphylaxis  . Latex Hives, Itching and Rash  . Paxil [Paroxetine] Hives, Itching and Rash  . Seroquel [Quetiapine Fumarate] Hives, Itching and Rash  . Zoloft [Sertraline Hcl] Hives, Itching and Rash    DISCHARGE MEDICATIONS:   Current Discharge  Medication List    START taking these medications   Details  folic acid (FOLVITE) 1 MG tablet Take 1 tablet (1 mg total) by mouth daily. Qty: 30 tablet, Refills: 0    thiamine 100 MG tablet Take 1 tablet (100 mg total) by mouth daily. Qty: 7 tablet, Refills: 0      CONTINUE these medications which have NOT CHANGED   Details  LORazepam (ATIVAN) 1 MG tablet Take 1 tablet (1 mg total) by mouth 2 (two) times daily as needed for anxiety. Qty: 12 tablet, Refills: 0    gabapentin (NEURONTIN) 300 MG capsule Take 1 capsule (300 mg total) by mouth 3 (three) times daily. Qty: 90 capsule, Refills: 1    HYDROcodone-acetaminophen (NORCO) 5-325 MG per tablet Take 1-2 tablets by mouth every 4 (four) hours as needed for pain. Qty: 20 tablet, Refills: 0    lipase/protease/amylase (CREON) 36000 UNITS CPEP capsule Take 1 capsule (36,000 Units total) by mouth 3 (three) times daily before meals. Qty: 270 capsule, Refills: 0    mirtazapine (REMERON) 30 MG tablet Take 1 tablet (30 mg total) by mouth at bedtime. Qty: 30 tablet, Refills: 0         DISCHARGE INSTRUCTIONS:    If you experience worsening of your admission symptoms, develop shortness of breath, life threatening emergency, suicidal or homicidal thoughts you must seek medical attention immediately by calling 911 or calling your MD immediately  if symptoms less severe.  You Must read complete instructions/literature along with all the possible adverse reactions/side effects for all the Medicines you take and that have been prescribed to you. Take  any new Medicines after you have completely understood and accept all the possible adverse reactions/side effects.   Please note  You were cared for by a hospitalist during your hospital stay. If you have any questions about your discharge medications or the care you received while you were in the hospital after you are discharged, you can call the unit and asked to speak with the hospitalist on  call if the hospitalist that took care of you is not available. Once you are discharged, your primary care physician will handle any further medical issues. Please note that NO REFILLS for any discharge medications will be authorized once you are discharged, as it is imperative that you return to your primary care physician (or establish a relationship with a primary care physician if you do not have one) for your aftercare needs so that they can reassess your need for medications and monitor your lab values.    Today   SUBJECTIVE   No complaint.    VITAL SIGNS:  Blood pressure 141/94, pulse 90, temperature 98 F (36.7 C), temperature source Oral, resp. rate 18, height 5' 6"  (1.676 m), weight 41.958 kg (92 lb 8 oz), last menstrual period 02/16/2015, SpO2 99 %.  I/O:   Intake/Output Summary (Last 24 hours) at 03/03/15 1256 Last data filed at 03/03/15 0554  Gross per 24 hour  Intake 3465.33 ml  Output      0 ml  Net 3465.33 ml    PHYSICAL EXAMINATION:  GENERAL:  29 y.o.-year-old patient lying in the bed with no acute distress. Thin. EYES: Pupils equal, round, reactive to light and accommodation. No scleral icterus. Extraocular muscles intact.  HEENT: Head atraumatic, normocephalic. Oropharynx and nasopharynx clear.  NECK:  Supple, no jugular venous distention. No thyroid enlargement, no tenderness.  LUNGS: Normal breath sounds bilaterally, no wheezing, rales,rhonchi or crepitation. No use of accessory muscles of respiration.  CARDIOVASCULAR: S1, S2 normal. No murmurs, rubs, or gallops.  ABDOMEN: Soft, non-tender, non-distended. Bowel sounds present. No organomegaly or mass. PEG in situ. EXTREMITIES: No pedal edema, cyanosis, or clubbing.  NEUROLOGIC: Cranial nerves II through XII are intact. Muscle strength 5/5 in all extremities. Sensation intact. Gait not checked.  PSYCHIATRIC: The patient is alert and oriented x 3.  SKIN: No obvious rash, lesion, or ulcer.   DATA REVIEW:    CBC  Recent Labs Lab 03/02/15 0419  WBC 8.7  HGB 12.7  HCT 38.0  PLT 111*    Chemistries   Recent Labs Lab 03/01/15 1700 03/02/15 0419  NA  --  137  K  --  3.7  CL  --  103  CO2  --  26  GLUCOSE  --  68  BUN  --  12  CREATININE  --  0.54  CALCIUM  --  7.3*  MG 1.9  --   AST  --  71*  ALT  --  87*  ALKPHOS  --  92  BILITOT  --  1.1    Cardiac Enzymes No results for input(s): TROPONINI in the last 168 hours.  Microbiology Results  Results for orders placed or performed during the hospital encounter of 03/01/15  Stool culture     Status: None (Preliminary result)   Collection Time: 03/02/15  2:23 PM  Result Value Ref Range Status   Specimen Description STOOL  Final   Special Requests NONE  Final   Culture NO SALMONELLA OR SHIGELLA ISOLATED  Final   Report Status PENDING  Incomplete  C  difficile quick scan w PCR reflex ([facility] only)     Status: None   Collection Time: 03/02/15  2:23 PM  Result Value Ref Range Status   C Diff antigen NEGATIVE  Final   C Diff toxin NEGATIVE  Final   C Diff interpretation Negative for C. difficile  Final    RADIOLOGY:  Ct Abdomen Pelvis W Contrast  03/01/2015   CLINICAL DATA:  Weight loss.  Tachycardia.  Vomiting.  EXAM: CT ABDOMEN AND PELVIS WITH CONTRAST  TECHNIQUE: Multidetector CT imaging of the abdomen and pelvis was performed using the standard protocol following bolus administration of intravenous contrast.  CONTRAST:  176m OMNIPAQUE IOHEXOL 300 MG/ML  SOLN  COMPARISON:  03/01/2015; 01/04/2015  FINDINGS: Lower chest:  Unremarkable  Hepatobiliary: Diffuse hepatic steatosis. Mildly distended gallbladder with dilated common hepatic duct at 7 mm, and low origin of the cystic duct from the common bile duct.  Pancreas: Type 1 pancreas divisum.  Spleen: Unremarkable  Adrenals/Urinary Tract: Tiny amount of gas in the urinary bladder, query recent catheterization.  Stomach/Bowel: Gastrostomy tube. Appendix normal. Contracted proximal  colon, questionable mucosal enhancement.  Vascular/Lymphatic: Unremarkable  Reproductive: Unremarkable  Other: Considerable paucity of adipose tissue.  Musculoskeletal: Unremarkable  IMPRESSION: 1. There is potential mucosal enhancement in the ascending colon which could reflect a low-level colitis. 2. Hepatic steatosis. 3. Type 1 pancreas divisum. 4. Mildly prominent gallbladder and continued prominence of the biliary tree, chronic, etiology uncertain. Reason sonography did not demonstrate cholelithiasis or choledocholithiasis.   Electronically Signed   By: WVan ClinesM.D.   On: 03/01/2015 19:43   Dg Abd Acute W/chest  03/01/2015   CLINICAL DATA:  Vomiting, shortness of Breath  EXAM: DG ABDOMEN ACUTE W/ 1V CHEST  COMPARISON:  01/04/2015  FINDINGS: Cardiomediastinal silhouette is unremarkable. No acute infiltrate or pleural effusion. No pulmonary edema. There is normal small bowel gas pattern. A percutaneous gastric feeding tube is noted.  IMPRESSION: Negative abdominal radiographs.  No acute cardiopulmonary disease.   Electronically Signed   By: LLahoma CrockerM.D.   On: 03/01/2015 16:54   Dg Ugi W/small Bowel  03/03/2015   CLINICAL DATA:  Persistent nausea and vomiting as well as abdominal pain and discomfort, patient has a PEG tube placed proximally 1 month ago  EXAM: UPPER GI SERIES WITH SMALL BOWEL FOLLOW-THROUGH using single contrast barium  FLUOROSCOPY TIME:  Fluoroscopy Time (in minutes and seconds): 1 minutes, 54 seconds  Number of Acquired Images:  18  TECHNIQUE: Combined double contrast and single contrast upper GI series using effervescent crystals, thick barium, and thin barium. Subsequently, serial images of the small bowel were obtained including spot views of the terminal ileum.  COMPARISON:  None.  FINDINGS: The scout film reveals a normal bowel gas pattern. The G-tube is positioned over the distal stomach. The bony structures are unremarkable.  The patient ingested the barium preparation  without difficulty. Due to her are weakness the study was performed largely with the patient in the supine the stomach distended well. The mucosal fold pattern was normal. Gastric emptying was prompt. The duodenal bulb and C sweep were normal.  Over the course of 100 minutes the barium passed from the stomach to the right colon. The jejunum exhibited normal feathery mucosal pattern. The mucosal pattern of the ileum was also normal. Peristalsis through the terminal ileum was observed fluoroscopically and was normal. The cecum exhibited no acute abnormality.  IMPRESSION: Normal upper GI series with normal small bowel follow-through examination.  Electronically Signed   By: David  Martinique M.D.   On: 03/03/2015 12:23        Management plans discussed with the patient, family and they are in agreement.  CODE STATUS:     Code Status Orders        Start     Ordered   03/01/15 2052  Full code   Continuous     03/01/15 2052      TOTAL TIME TAKING CARE OF THIS PATIENT: 33 minutes.    Demetrios Loll M.D on 03/03/2015 at 12:56 PM  Between 7am to 6pm - Pager - [phone]  After 6pm go to [email or web address] - password [email or web address] [facility]  [facility]  Office  [phone]  CC: Primary care physician; Leonides Sake, MD

## 2015-03-03 NOTE — Progress Notes (Signed)
Pt being discharged home at this, no noted complaints at discharge, discharge instructions reviewed with pt, states understanding

## 2015-03-04 LAB — MISC LABCORP TEST (SEND OUT)
LABCORP TEST CODE: 123016
LABCORP TEST CODE: 8623

## 2015-03-04 LAB — STOOL CULTURE

## 2015-03-07 LAB — INFLAMMATORY BOWEL DISEASE-IBD
Atypical P-ANCA titer: <1:20 {titer}
Saccharomyces cerevisiae, IgG: 20.1 Units (ref 0.0–24.9)

## 2015-03-21 ENCOUNTER — Emergency Department
Admission: EM | Admit: 2015-03-21 | Discharge: 2015-03-21 | Disposition: A | Discharge status: Home / Self Care | Payer: Medicaid Other | Attending: Emergency Medicine | Admitting: Emergency Medicine

## 2015-03-21 ENCOUNTER — Encounter: Payer: Self-pay | Admitting: Emergency Medicine

## 2015-03-21 DIAGNOSIS — R1110 Vomiting, unspecified: Secondary | ICD-10-CM

## 2015-03-21 DIAGNOSIS — E860 Dehydration: Secondary | ICD-10-CM | POA: Insufficient documentation

## 2015-03-21 DIAGNOSIS — Z720 Tobacco use: Secondary | ICD-10-CM | POA: Insufficient documentation

## 2015-03-21 DIAGNOSIS — Z91040 Latex allergy status: Secondary | ICD-10-CM | POA: Diagnosis not present

## 2015-03-21 DIAGNOSIS — Z79899 Other long term (current) drug therapy: Secondary | ICD-10-CM | POA: Diagnosis not present

## 2015-03-21 DIAGNOSIS — R197 Diarrhea, unspecified: Secondary | ICD-10-CM | POA: Insufficient documentation

## 2015-03-21 LAB — COMPREHENSIVE METABOLIC PANEL
ALBUMIN: 3.2 g/dL — AB (ref 3.5–5.0)
ALK PHOS: 114 U/L (ref 38–126)
ALT: 102 U/L — AB (ref 14–54)
AST: 369 U/L — ABNORMAL HIGH (ref 15–41)
Anion gap: 14 (ref 5–15)
BUN: 23 mg/dL — AB (ref 6–20)
CALCIUM: 7.7 mg/dL — AB (ref 8.9–10.3)
CHLORIDE: 96 mmol/L — AB (ref 101–111)
CO2: 26 mmol/L (ref 22–32)
Creatinine, Ser: 0.72 mg/dL (ref 0.44–1.00)
Glucose, Bld: 106 mg/dL — ABNORMAL HIGH (ref 65–99)
Potassium: 3.1 mmol/L — ABNORMAL LOW (ref 3.5–5.1)
Sodium: 136 mmol/L (ref 135–145)
Total Bilirubin: 0.7 mg/dL (ref 0.3–1.2)
Total Protein: 6.4 g/dL — ABNORMAL LOW (ref 6.5–8.1)

## 2015-03-21 LAB — CBC WITH DIFFERENTIAL/PLATELET
BASOS ABS: 0 10*3/uL (ref 0–0.1)
BASOS PCT: 0 %
EOS ABS: 0 10*3/uL (ref 0–0.7)
Eosinophils Relative: 0 %
HCT: 46 % (ref 35.0–47.0)
HEMOGLOBIN: 15.6 g/dL (ref 12.0–16.0)
Lymphocytes Relative: 13 %
Lymphs Abs: 0.8 10*3/uL — ABNORMAL LOW (ref 1.0–3.6)
MCH: 31.6 pg (ref 26.0–34.0)
MCHC: 34 g/dL (ref 32.0–36.0)
MCV: 93 fL (ref 80.0–100.0)
Monocytes Absolute: 0.5 10*3/uL (ref 0.2–0.9)
Monocytes Relative: 7 %
NEUTROS ABS: 5.4 10*3/uL (ref 1.4–6.5)
NEUTROS PCT: 80 %
Platelets: 135 10*3/uL — ABNORMAL LOW (ref 150–440)
RBC: 4.94 MIL/uL (ref 3.80–5.20)
RDW: 14.4 % (ref 11.5–14.5)
WBC: 6.7 10*3/uL (ref 3.6–11.0)

## 2015-03-21 MED ORDER — LORAZEPAM 1 MG PO TABS
1.0000 mg | ORAL_TABLET | Freq: Once | ORAL | Status: AC
  Administered 2015-03-21: 1 mg via ORAL

## 2015-03-21 MED ORDER — LORAZEPAM 1 MG PO TABS: 1.0000 mg | ORAL_TABLET | Freq: Two times a day (BID) | ORAL | Status: DC | PRN

## 2015-03-21 MED ORDER — LORAZEPAM 2 MG/ML IJ SOLN
1.0000 mg | Freq: Once | INTRAMUSCULAR | Status: AC
  Administered 2015-03-21: 1 mg via INTRAVENOUS
  Filled 2015-03-21: qty 1

## 2015-03-21 MED ORDER — LORAZEPAM 1 MG PO TABS
ORAL_TABLET | ORAL | Status: AC
  Administered 2015-03-21: 1 mg via ORAL
  Filled 2015-03-21: qty 1

## 2015-03-21 MED ORDER — METHYLPREDNISOLONE SODIUM SUCC 125 MG IJ SOLR
60.0000 mg | Freq: Once | INTRAMUSCULAR | Status: AC
  Administered 2015-03-21: 60 mg via INTRAVENOUS
  Filled 2015-03-21: qty 2

## 2015-03-21 NOTE — ED Notes (Signed)
Report received from [facility]. G, RN.

## 2015-03-21 NOTE — Discharge Instructions (Signed)
Dehydration, Adult Dehydration is when you lose more fluids from the body than you take in. Vital organs like the kidneys, brain, and heart cannot function without a proper amount of fluids and salt. Any loss of fluids from the body can cause dehydration.  CAUSES   Vomiting.  Diarrhea.  Excessive sweating.  Excessive urine output.  Fever. SYMPTOMS  Mild dehydration  Thirst.  Dry lips.  Slightly dry mouth. Moderate dehydration  Very dry mouth.  Sunken eyes.  Skin does not bounce back quickly when lightly pinched and released.  Dark urine and decreased urine production.  Decreased tear production.  Headache. Severe dehydration  Very dry mouth.  Extreme thirst.  Rapid, weak pulse (more than 100 beats per minute at rest).  Cold hands and feet.  Not able to sweat in spite of heat and temperature.  Rapid breathing.  Blue lips.  Confusion and lethargy.  Difficulty being awakened.  Minimal urine production.  No tears. DIAGNOSIS  Your caregiver will diagnose dehydration based on your symptoms and your exam. Blood and urine tests will help confirm the diagnosis. The diagnostic evaluation should also identify the cause of dehydration. TREATMENT  Treatment of mild or moderate dehydration can often be done at home by increasing the amount of fluids that you drink. It is best to drink small amounts of fluid more often. Drinking too much at one time can make vomiting worse. Refer to the home care instructions below. Severe dehydration needs to be treated at the hospital where you will probably be given intravenous (IV) fluids that contain water and electrolytes. HOME CARE INSTRUCTIONS   Ask your caregiver about specific rehydration instructions.  Drink enough fluids to keep your urine clear or pale yellow.  Drink small amounts frequently if you have nausea and vomiting.  Eat as you normally do.  Avoid:  Foods or drinks high in sugar.  Carbonated  drinks.  Juice.  Extremely hot or cold fluids.  Drinks with caffeine.  Fatty, greasy foods.  Alcohol.  Tobacco.  Overeating.  Gelatin desserts.  Wash your hands well to avoid spreading bacteria and viruses.  Only take over-the-counter or prescription medicines for pain, discomfort, or fever as directed by your caregiver.  Ask your caregiver if you should continue all prescribed and over-the-counter medicines.  Keep all follow-up appointments with your caregiver. SEEK MEDICAL CARE IF:  You have abdominal pain and it increases or stays in one area (localizes).  You have a rash, stiff neck, or severe headache.  You are irritable, sleepy, or difficult to awaken.  You are weak, dizzy, or extremely thirsty. SEEK IMMEDIATE MEDICAL CARE IF:   You are unable to keep fluids down or you get worse despite treatment.  You have frequent episodes of vomiting or diarrhea.  You have blood or green matter (bile) in your vomit.  You have blood in your stool or your stool looks black and tarry.  You have not urinated in 6 to 8 hours, or you have only urinated a small amount of very dark urine.  You have a fever.  You faint. MAKE SURE YOU:   Understand these instructions.  Will watch your condition.  Will get help right away if you are not doing well or get worse. Document Released: 08/13/2005 Document Revised: 11/05/2011 Document Reviewed: 04/02/2011 [facility] Patient Information 2015 [place], [place]. This information is not intended to replace advice given to you by your health care provider. Make sure you discuss any questions you have with your health care  provider.  Chronic Diarrhea Diarrhea is frequent loose and watery bowel movements. It can cause you to feel weak and dehydrated. Dehydration can cause you to become tired and thirsty and to have a dry mouth, decreased urination, and dark yellow urine. Diarrhea is a sign of another problem, most often an infection that will  not last long. In most cases, diarrhea lasts 2-3 days. Diarrhea that lasts longer than 4 weeks is called long-lasting (chronic) diarrhea. It is important to treat your diarrhea as directed by your health care provider to lessen or prevent future episodes of diarrhea.  CAUSES  There are many causes of chronic diarrhea. The following are some possible causes:   Gastrointestinal infections caused by viruses, bacteria, or parasites.   Food poisoning or food allergies.   Certain medicines, such as antibiotics, chemotherapy, and laxatives.   Artificial sweeteners and fructose.   Digestive disorders, such as celiac disease and inflammatory bowel diseases.   Irritable bowel syndrome.  Some disorders of the pancreas.  Disorders of the thyroid.  Reduced blood flow to the intestines.  Cancer. Sometimes the cause of chronic diarrhea is unknown. RISK FACTORS  Having a severely weakened immune system, such as from HIV or AIDS.   Taking certain types of cancer-fighting drugs (such as with chemotherapy) or other medicines.   Having had a recent organ transplant.   Having a portion of the stomach or small bowel removed.   Traveling to countries where food and water supplies are often contaminated.  SYMPTOMS  In addition to frequent, loose stools, diarrhea may cause:   Cramping.   Abdominal pain.   Nausea.   Fever.  Fatigue.  Urgent need to use the bathroom.  Loss of bowel control. DIAGNOSIS  Your health care provider must take a careful history and perform a physical exam. Tests given are based on your symptoms and history. Tests may include:   Blood or stool tests. Three or more stool samples may be examined. Stool cultures may be used to test for bacteria or parasites.   X-rays.   A procedure in which a thin tube is inserted into the mouth or rectum (endoscopy). This allows the health care provider to look inside the intestine.  TREATMENT   Treatment is  aimed at correcting the cause of the diarrhea when possible.  Diarrhea caused by an infection can often be treated with antibiotic medicines.  Diarrhea not caused by an infection may require you to take long-term medicine or have surgery. Specific treatment should be discussed with your health care provider.  If the cause cannot be determined, treatment aims to relieve symptoms and prevent dehydration. Serious health problems can occur if you do not maintain proper fluid levels. Treatment may include:  Taking an oral rehydration solution (ORS).  Not drinking beverages that contain caffeine (such as tea, coffee, and soft drinks).  Not drinking alcohol.  Maintaining well-balanced nutrition to help you recover faster. HOME CARE INSTRUCTIONS   Drink enough fluids to keep urine clear or pale yellow. Drink 1 cup (8 oz) of fluid for each diarrhea episode. Avoid fluids that contain simple sugars, fruit juices, whole milk products, and sodas. Hydrate with an ORS. You may purchase the ORS or prepare it at home by mixing the following ingredients together:   - tsp (1.7-3  mL) table salt.   tsp (3  mL) baking soda.   tsp (1.7 mL) salt substitute containing potassium chloride.  1 tbsp (20 mL) sugar.  4.2 c (1 L) of  water.   Certain foods and beverages may increase the speed at which food moves through the gastrointestinal (GI) tract. These foods and beverages should be avoided. They include:  Caffeinated and alcoholic beverages.  High-fiber foods, such as raw fruits and vegetables, nuts, seeds, and whole grain breads and cereals.  Foods and beverages sweetened with sugar alcohols, such as xylitol, sorbitol, and mannitol.   Some foods may be well tolerated and may help thicken stool. These include:  Starchy foods, such as rice, toast, pasta, low-sugar cereal, oatmeal, grits, baked potatoes, crackers, and bagels.  Bananas.  Applesauce.  Add probiotic-rich foods to help increase  healthy bacteria in the GI tract. These include yogurt and fermented milk products.  Wash your hands well after each diarrhea episode.  Only take over-the-counter or prescription medicines as directed by your health care provider.  Take a warm bath to relieve any burning or pain from frequent diarrhea episodes. SEEK MEDICAL CARE IF:   You are not urinating as often.  Your urine is a dark color.  You become very tired or dizzy.  You have severe pain in the abdomen or rectum.  Your have blood or pus in your stools.  Your stools look black and tarry. SEEK IMMEDIATE MEDICAL CARE IF:   You are unable to keep fluids down.  You have persistent vomiting.  You have blood in your stool.  Your stools are black and tarry.  You do not urinate in 6-8 hours, or there is only a small amount of very dark urine.  You have abdominal pain that increases or localizes.  You have weakness, dizziness, confusion, or lightheadedness.  You have a severe headache.  Your diarrhea gets worse or does not get better.  You have a fever or persistent symptoms for more than 2-3 days.  You have a fever and your symptoms suddenly get worse. MAKE SURE YOU:   Understand these instructions.  Will watch your condition.  Will get help right away if you are not doing well or get worse. Document Released: 11/03/2003 Document Revised: 08/18/2013 Document Reviewed: 02/05/2013 [facility] Patient Information 2015 [place], [place]. This information is not intended to replace advice given to you by your health care provider. Make sure you discuss any questions you have with your health care provider.
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
## 2015-03-21 NOTE — ED Notes (Signed)
RN accommodated pt request to speak to Dr. Joni Fears regarding Rx. Pt made aware that she is unable to wait in this current treatment room and would be more than welcome to wait in the Lobby.
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
## 2015-03-28 ENCOUNTER — Ambulatory Visit: Payer: Self-pay | Admitting: Gastroenterology

## 2015-04-16 ENCOUNTER — Emergency Department: Payer: Medicaid Other

## 2015-04-16 ENCOUNTER — Encounter: Payer: Self-pay | Admitting: Emergency Medicine

## 2015-04-16 ENCOUNTER — Inpatient Hospital Stay
Admission: EM | Admit: 2015-04-16 | Discharge: 2015-04-18 | DRG: 391 | Disposition: A | Discharge status: Home / Self Care | Payer: Medicaid Other | Attending: Internal Medicine | Admitting: Internal Medicine

## 2015-04-16 DIAGNOSIS — Z931 Gastrostomy status: Secondary | ICD-10-CM

## 2015-04-16 DIAGNOSIS — Z882 Allergy status to sulfonamides status: Secondary | ICD-10-CM

## 2015-04-16 DIAGNOSIS — K709 Alcoholic liver disease, unspecified: Secondary | ICD-10-CM | POA: Diagnosis present

## 2015-04-16 DIAGNOSIS — E876 Hypokalemia: Secondary | ICD-10-CM | POA: Diagnosis present

## 2015-04-16 DIAGNOSIS — F10230 Alcohol dependence with withdrawal, uncomplicated: Secondary | ICD-10-CM | POA: Diagnosis present

## 2015-04-16 DIAGNOSIS — K760 Fatty (change of) liver, not elsewhere classified: Secondary | ICD-10-CM | POA: Diagnosis present

## 2015-04-16 DIAGNOSIS — F17210 Nicotine dependence, cigarettes, uncomplicated: Secondary | ICD-10-CM | POA: Diagnosis present

## 2015-04-16 DIAGNOSIS — E43 Unspecified severe protein-calorie malnutrition: Secondary | ICD-10-CM | POA: Diagnosis present

## 2015-04-16 DIAGNOSIS — Z823 Family history of stroke: Secondary | ICD-10-CM

## 2015-04-16 DIAGNOSIS — K5090 Crohn's disease, unspecified, without complications: Secondary | ICD-10-CM | POA: Diagnosis present

## 2015-04-16 DIAGNOSIS — Z809 Family history of malignant neoplasm, unspecified: Secondary | ICD-10-CM

## 2015-04-16 DIAGNOSIS — R627 Adult failure to thrive: Secondary | ICD-10-CM

## 2015-04-16 DIAGNOSIS — Z91040 Latex allergy status: Secondary | ICD-10-CM | POA: Diagnosis not present

## 2015-04-16 DIAGNOSIS — R109 Unspecified abdominal pain: Secondary | ICD-10-CM

## 2015-04-16 DIAGNOSIS — F410 Panic disorder [episodic paroxysmal anxiety] without agoraphobia: Secondary | ICD-10-CM | POA: Diagnosis present

## 2015-04-16 DIAGNOSIS — F411 Generalized anxiety disorder: Secondary | ICD-10-CM | POA: Diagnosis present

## 2015-04-16 DIAGNOSIS — F10939 Alcohol use, unspecified with withdrawal, unspecified: Secondary | ICD-10-CM

## 2015-04-16 DIAGNOSIS — F4310 Post-traumatic stress disorder, unspecified: Secondary | ICD-10-CM | POA: Diagnosis present

## 2015-04-16 DIAGNOSIS — Z888 Allergy status to other drugs, medicaments and biological substances status: Secondary | ICD-10-CM | POA: Diagnosis not present

## 2015-04-16 DIAGNOSIS — K828 Other specified diseases of gallbladder: Secondary | ICD-10-CM | POA: Diagnosis present

## 2015-04-16 DIAGNOSIS — N3090 Cystitis, unspecified without hematuria: Secondary | ICD-10-CM | POA: Diagnosis present

## 2015-04-16 DIAGNOSIS — F10930 Alcohol use, unspecified with withdrawal, uncomplicated: Secondary | ICD-10-CM

## 2015-04-16 DIAGNOSIS — R1084 Generalized abdominal pain: Secondary | ICD-10-CM | POA: Diagnosis present

## 2015-04-16 DIAGNOSIS — F10239 Alcohol dependence with withdrawal, unspecified: Secondary | ICD-10-CM

## 2015-04-16 DIAGNOSIS — K2980 Duodenitis without bleeding: Secondary | ICD-10-CM | POA: Diagnosis present

## 2015-04-16 DIAGNOSIS — R1110 Vomiting, unspecified: Secondary | ICD-10-CM

## 2015-04-16 DIAGNOSIS — K623 Rectal prolapse: Secondary | ICD-10-CM | POA: Diagnosis present

## 2015-04-16 LAB — CBC WITH DIFFERENTIAL/PLATELET
BASOS ABS: 0 10*3/uL (ref 0–0.1)
Basophils Relative: 0 %
Eosinophils Absolute: 0.1 10*3/uL (ref 0–0.7)
Eosinophils Relative: 1 %
HCT: 55 % — ABNORMAL HIGH (ref 35.0–47.0)
HEMOGLOBIN: 18.5 g/dL — AB (ref 12.0–16.0)
LYMPHS ABS: 2.4 10*3/uL (ref 1.0–3.6)
LYMPHS PCT: 19 %
MCH: 30.9 pg (ref 26.0–34.0)
MCHC: 33.6 g/dL (ref 32.0–36.0)
MCV: 91.9 fL (ref 80.0–100.0)
Monocytes Absolute: 0.6 10*3/uL (ref 0.2–0.9)
Monocytes Relative: 5 %
NEUTROS PCT: 75 %
Neutro Abs: 9.7 10*3/uL — ABNORMAL HIGH (ref 1.4–6.5)
Platelets: 208 10*3/uL (ref 150–440)
RBC: 5.98 MIL/uL — ABNORMAL HIGH (ref 3.80–5.20)
RDW: 14.6 % — ABNORMAL HIGH (ref 11.5–14.5)
WBC: 12.8 10*3/uL — ABNORMAL HIGH (ref 3.6–11.0)

## 2015-04-16 LAB — URINALYSIS COMPLETE WITH MICROSCOPIC (ARMC ONLY)
BILIRUBIN URINE: NEGATIVE
Glucose, UA: NEGATIVE mg/dL
Leukocytes, UA: NEGATIVE
Nitrite: NEGATIVE
PH: 5 (ref 5.0–8.0)
PROTEIN: 100 mg/dL — AB
Specific Gravity, Urine: 1.021 (ref 1.005–1.030)

## 2015-04-16 LAB — COMPREHENSIVE METABOLIC PANEL
ALBUMIN: 3.8 g/dL (ref 3.5–5.0)
ALK PHOS: 165 U/L — AB (ref 38–126)
ALT: 213 U/L — AB (ref 14–54)
AST: 567 U/L — AB (ref 15–41)
Anion gap: 12 (ref 5–15)
BUN: 19 mg/dL (ref 6–20)
CALCIUM: 8.4 mg/dL — AB (ref 8.9–10.3)
CHLORIDE: 98 mmol/L — AB (ref 101–111)
CO2: 26 mmol/L (ref 22–32)
Creatinine, Ser: 0.56 mg/dL (ref 0.44–1.00)
GFR calc non Af Amer: >60 mL/min (ref >60)
GLUCOSE: 130 mg/dL — AB (ref 65–99)
Potassium: 3.6 mmol/L (ref 3.5–5.1)
Sodium: 136 mmol/L (ref 135–145)
Total Bilirubin: 2 mg/dL — ABNORMAL HIGH (ref 0.3–1.2)
Total Protein: 7.3 g/dL (ref 6.5–8.1)

## 2015-04-16 LAB — LIPASE, BLOOD: Lipase: 19 U/L — ABNORMAL LOW (ref 22–51)

## 2015-04-16 MED ORDER — LORAZEPAM 1 MG PO TABS
1.0000 mg | ORAL_TABLET | Freq: Four times a day (QID) | ORAL | Status: DC | PRN
  Administered 2015-04-18: 1 mg via ORAL
  Filled 2015-04-16: qty 1

## 2015-04-16 MED ORDER — FOLIC ACID 1 MG PO TABS
1.0000 mg | ORAL_TABLET | Freq: Every day | ORAL | Status: DC
  Administered 2015-04-17 – 2015-04-18 (×2): 1 mg via ORAL
  Filled 2015-04-16 (×3): qty 1

## 2015-04-16 MED ORDER — ACETAMINOPHEN 650 MG RE SUPP: 650.0000 mg | Freq: Four times a day (QID) | RECTAL | Status: DC | PRN

## 2015-04-16 MED ORDER — DOCUSATE SODIUM 100 MG PO CAPS
100.0000 mg | ORAL_CAPSULE | Freq: Two times a day (BID) | ORAL | Status: DC
  Filled 2015-04-16 (×2): qty 1

## 2015-04-16 MED ORDER — LORAZEPAM 2 MG/ML IJ SOLN
1.0000 mg | Freq: Once | INTRAMUSCULAR | Status: AC
  Administered 2015-04-16: 1 mg via INTRAVENOUS

## 2015-04-16 MED ORDER — PANTOPRAZOLE SODIUM 40 MG IV SOLR
40.0000 mg | Freq: Two times a day (BID) | INTRAVENOUS | Status: DC
  Administered 2015-04-17 (×2): 40 mg via INTRAVENOUS
  Filled 2015-04-16 (×3): qty 40

## 2015-04-16 MED ORDER — ONDANSETRON HCL 4 MG/2ML IJ SOLN
4.0000 mg | Freq: Once | INTRAMUSCULAR | Status: AC
  Administered 2015-04-16: 4 mg via INTRAVENOUS
  Filled 2015-04-16: qty 2

## 2015-04-16 MED ORDER — LORAZEPAM 2 MG/ML IJ SOLN
INTRAMUSCULAR | Status: AC
  Administered 2015-04-16: 1 mg via INTRAVENOUS
  Filled 2015-04-16: qty 1

## 2015-04-16 MED ORDER — VITAMIN B-1 100 MG PO TABS
100.0000 mg | ORAL_TABLET | Freq: Every day | ORAL | Status: DC
  Administered 2015-04-18: 100 mg via ORAL
  Filled 2015-04-16 (×2): qty 1

## 2015-04-16 MED ORDER — LORAZEPAM 2 MG/ML IJ SOLN
1.0000 mg | Freq: Four times a day (QID) | INTRAMUSCULAR | Status: DC | PRN
  Administered 2015-04-16: 1 mg via INTRAVENOUS
  Filled 2015-04-16 (×2): qty 1

## 2015-04-16 MED ORDER — HYDROMORPHONE HCL 1 MG/ML IJ SOLN: 1.0000 mg | INTRAMUSCULAR | Status: DC | PRN

## 2015-04-16 MED ORDER — SODIUM CHLORIDE 0.9 % IJ SOLN
3.0000 mL | Freq: Two times a day (BID) | INTRAMUSCULAR | Status: DC
  Administered 2015-04-17 – 2015-04-18 (×2): 3 mL via INTRAVENOUS

## 2015-04-16 MED ORDER — LORAZEPAM 2 MG/ML IJ SOLN
INTRAMUSCULAR | Status: AC
  Filled 2015-04-16: qty 1

## 2015-04-16 MED ORDER — LORAZEPAM 2 MG/ML IJ SOLN: 0.0000 mg | Freq: Two times a day (BID) | INTRAMUSCULAR | Status: DC

## 2015-04-16 MED ORDER — LORAZEPAM 2 MG/ML IJ SOLN
0.0000 mg | Freq: Four times a day (QID) | INTRAMUSCULAR | Status: DC
  Administered 2015-04-17 (×3): 2 mg via INTRAVENOUS
  Filled 2015-04-16 (×2): qty 1

## 2015-04-16 MED ORDER — HEPARIN SODIUM (PORCINE) 5000 UNIT/ML IJ SOLN
5000.0000 [IU] | Freq: Three times a day (TID) | INTRAMUSCULAR | Status: DC
  Administered 2015-04-17 – 2015-04-18 (×3): 5000 [IU] via SUBCUTANEOUS
  Filled 2015-04-16 (×3): qty 1

## 2015-04-16 MED ORDER — ACETAMINOPHEN 325 MG PO TABS: 650.0000 mg | ORAL_TABLET | Freq: Four times a day (QID) | ORAL | Status: DC | PRN

## 2015-04-16 MED ORDER — ONDANSETRON HCL 4 MG/2ML IJ SOLN
4.0000 mg | Freq: Four times a day (QID) | INTRAMUSCULAR | Status: DC | PRN
  Administered 2015-04-17: 4 mg via INTRAVENOUS
  Filled 2015-04-16: qty 2

## 2015-04-16 MED ORDER — ONDANSETRON HCL 4 MG PO TABS
4.0000 mg | ORAL_TABLET | Freq: Four times a day (QID) | ORAL | Status: DC | PRN
Start: 2015-04-16 — End: 2015-04-18

## 2015-04-16 MED ORDER — THIAMINE HCL 100 MG/ML IJ SOLN
100.0000 mg | Freq: Every day | INTRAMUSCULAR | Status: DC
  Administered 2015-04-16 – 2015-04-17 (×2): 100 mg via INTRAVENOUS
  Filled 2015-04-16 (×3): qty 2

## 2015-04-16 MED ORDER — ADULT MULTIVITAMIN W/MINERALS CH
1.0000 | ORAL_TABLET | Freq: Every day | ORAL | Status: DC
  Administered 2015-04-17 – 2015-04-18 (×2): 1 via ORAL
  Filled 2015-04-16 (×3): qty 1

## 2015-04-16 MED ORDER — JEVITY 1.5 CAL/FIBER PO LIQD: 1000.0000 mL | ORAL | Status: DC

## 2015-04-16 MED ORDER — CEFTRIAXONE SODIUM 1 G IJ SOLR
1.0000 g | INTRAMUSCULAR | Status: DC
  Filled 2015-04-16 (×4): qty 10

## 2015-04-16 MED ORDER — SODIUM CHLORIDE 0.9 % IV SOLN
1000.0000 mL | Freq: Once | INTRAVENOUS | Status: AC
  Administered 2015-04-16: 1000 mL via INTRAVENOUS

## 2015-04-16 MED ORDER — SODIUM CHLORIDE 0.9 % IV BOLUS (SEPSIS)
1000.0000 mL | Freq: Once | INTRAVENOUS | Status: AC
  Administered 2015-04-16: 1000 mL via INTRAVENOUS

## 2015-04-16 MED ORDER — SODIUM CHLORIDE 0.9 % IV SOLN
INTRAVENOUS | Status: DC
  Administered 2015-04-16: 21:00:00 via INTRAVENOUS

## 2015-04-16 NOTE — ED Provider Notes (Signed)
[facility] Emergency Department Provider Note  ____________________________________________  Time seen: 3:45 PM  I have reviewed the triage vital signs and the nursing notes.   HISTORY  Chief Complaint Weakness    HPI Kristine Macias is a 29 y.o. female who presents with complaints of nausea and vomiting for 5 days since she reportedly relapsed on alcohol. She states that she has not had alcohol in several months since having a feeding tube placed for weight loss secondary to anxiety but over the last several days she started drinking again. She complains of generalized abdominal discomfort. No fevers no chills. She feels weak and somewhat dizzy. She plans of nausea     Past Medical History  Diagnosis Date  . Anxiety   . Chlamydia 2007  . Irregular heart beat 2010    wt/diet related after evaluation  . Renal disorder   . Pneumothorax, spontaneous, tension   . Alcohol abuse   . Pancreas divisum   . Failure to thrive in adult     Patient Active Problem List   Diagnosis Date Noted  . Vomiting and diarrhea   . Failure to thrive in adult   . Intractable nausea and vomiting 03/01/2015  . Hypokalemia 03/01/2015  . Protein-calorie malnutrition, severe 01/05/2015  . Alcohol abuse   . Generalized anxiety disorder 11/26/2014    Class: Chronic  . Alcohol use disorder, severe, dependence 11/25/2014  . Adnexal mass 04/10/2012    Past Surgical History  Procedure Laterality Date  . Cesarean section    . Chest tube insertion    . Esophagogastroduodenoscopy N/A 01/06/2015    Procedure: ESOPHAGOGASTRODUODENOSCOPY (EGD);  Surgeon: Josefine Class, MD;  Location: [facility] ENDOSCOPY;  Service: Endoscopy;  Laterality: N/A;  . Colonoscopy with propofol N/A 01/06/2015    Procedure: COLONOSCOPY WITH PROPOFOL;  Surgeon: Josefine Class, MD;  Location: [facility] ENDOSCOPY;  Service: Endoscopy;  Laterality: N/A;  . Peg placement N/A 01/11/2015    Procedure:  PERCUTANEOUS ENDOSCOPIC GASTROSTOMY (PEG) PLACEMENT;  Surgeon: Josefine Class, MD;  Location: [facility] ENDOSCOPY;  Service: Endoscopy;  Laterality: N/A;  . Esophagogastroduodenoscopy N/A 01/11/2015    Rein-normal with PEG placement    Current Outpatient Rx  Name  Route  Sig  Dispense  Refill  . folic acid (FOLVITE) 1 MG tablet   Oral   Take 1 tablet (1 mg total) by mouth daily.   30 tablet   0   . gabapentin (NEURONTIN) 300 MG capsule   Oral   Take 1 capsule (300 mg total) by mouth 3 (three) times daily. Patient not taking: Reported on 01/04/2015   90 capsule   1   . HYDROcodone-acetaminophen (NORCO) 5-325 MG per tablet   Oral   Take 1-2 tablets by mouth every 4 (four) hours as needed for pain. Patient not taking: Reported on 11/24/2014   20 tablet   0   . lipase/protease/amylase (CREON) 36000 UNITS CPEP capsule   Oral   Take 1 capsule (36,000 Units total) by mouth 3 (three) times daily before meals. Patient not taking: Reported on 03/01/2015   270 capsule   0   . LORazepam (ATIVAN) 1 MG tablet   Oral   Take 1 tablet (1 mg total) by mouth 2 (two) times daily as needed for anxiety.   12 tablet   0   . LORazepam (ATIVAN) 1 MG tablet   Oral   Take 1 tablet (1 mg total) by mouth 2 (two) times daily as needed for  anxiety.   4 tablet   0   . mirtazapine (REMERON) 30 MG tablet   Oral   Take 1 tablet (30 mg total) by mouth at bedtime. Patient not taking: Reported on 03/01/2015   30 tablet   0   . thiamine 100 MG tablet   Oral   Take 1 tablet (100 mg total) by mouth daily.   7 tablet   0     Allergies Effexor; Sulfa antibiotics; Tetracyclines & related; Trazodone and nefazodone; Latex; Paxil; Seroquel; and Zoloft  Family History  Problem Relation Age of Onset  . Anesthesia problems Neg Hx   . Thyroid disease Mother   . Cancer Father   . Stroke Other   . Crohn's disease Brother     Social History Social History  Substance Use Topics  . Smoking status:  Current Every Day Smoker -- 1.00 packs/day    Types: Cigarettes  . Smokeless tobacco: Never Used  . Alcohol Use: 21.6 oz/week    36 Cans of beer per week     Comment: 6 beers/day    Review of Systems  Constitutional: Negative for fever. Eyes: Negative for visual changes. ENT: Negative for sore throat Cardiovascular: Negative for chest pain. Respiratory: Negative for shortness of breath. Gastrointestinal: Positive for nausea and vomiting, negative for diarrhea Genitourinary: Negative for dysuria. Musculoskeletal: Negative for back pain. Skin: Negative for rash. Neurological: Negative for headaches or focal weakness Psychiatric: Positive for anxiety    ____________________________________________   PHYSICAL EXAM:  VITAL SIGNS: ED Triage Vitals  Enc Vitals Group     BP 04/16/15 1250 119/75 mmHg     Pulse Rate 04/16/15 1250 133     Resp 04/16/15 1250 20     Temp 04/16/15 1250 97.8 F (36.6 C)     Temp Source 04/16/15 1250 Oral     SpO2 04/16/15 1250 94 %     Weight 04/16/15 1250 76 lb (34.473 kg)     Height 04/16/15 1250 5' 6"  (1.676 m)     Head Cir --      Peak Flow --      Pain Score 04/16/15 1251 6     Pain Loc --      Pain Edu? --      Excl. in [place]? --      Constitutional: Alert and oriented. Cachectic chronically ill-appearing Eyes: Conjunctivae are normal.  ENT   Head: Normocephalic and atraumatic.   Mouth/Throat: Mucous membranes are moist. Cardiovascular: Sinus tachycardia regular rhythm. Normal and symmetric distal pulses are present in all extremities. No murmurs, rubs, or gallops. Respiratory: Normal respiratory effort without tachypnea nor retractions. Breath sounds are clear and equal bilaterally.  Gastrointestinal: Soft and non-tender in all quadrants. No distention. There is no CVA tenderness. Genitourinary: deferred Musculoskeletal: Nontender with normal range of motion in all extremities. No lower extremity tenderness nor  edema. Neurologic:  Normal speech and language. No gross focal neurologic deficits are appreciated. Skin:  Skin is warm, dry and intact. No rash noted. Psychiatric: Patient is highly anxious. Patient exhibits appropriate insight and judgment.  ____________________________________________    LABS (pertinent positives/negatives)  Labs Reviewed  CBC WITH DIFFERENTIAL/PLATELET - Abnormal; Notable for the following:    WBC 12.8 (*)    RBC 5.98 (*)    Hemoglobin 18.5 (*)    HCT 55.0 (*)    RDW 14.6 (*)    Neutro Abs 9.7 (*)    All other components within normal limits  COMPREHENSIVE METABOLIC PANEL -  Abnormal; Notable for the following:    Chloride 98 (*)    Glucose, Bld 130 (*)    Calcium 8.4 (*)    AST 567 (*)    ALT 213 (*)    Alkaline Phosphatase 165 (*)    Total Bilirubin 2.0 (*)    All other components within normal limits  LIPASE, BLOOD - Abnormal; Notable for the following:    Lipase 19 (*)    All other components within normal limits  URINALYSIS COMPLETEWITH MICROSCOPIC ([facility] ONLY)    ____________________________________________   EKG  None  ____________________________________________    RADIOLOGY I have personally reviewed any xrays that were ordered on this patient: Ultrasound right upper quadrant shows distended gallbladder but no stones and normal common bile duct  ____________________________________________   PROCEDURES  Procedure(s) performed: none  Critical Care performed: none  ____________________________________________   INITIAL IMPRESSION / ASSESSMENT AND PLAN / ED COURSE  Pertinent labs & imaging results that were available during my care of the patient were reviewed by me and considered in my medical decision making (see chart for details).  Patient chronically ill with PEG tube for supplemental caloric intake. She is able to take by mouth's. Long history of drinking. She reports she has not had alcohol in 3 months before relapsing  recently, however review of records demonstrates admission in July for similar complaints. We will give IV fluids, Zofran I will check an ultrasound given her elevated T bili and abnormal liver enzymes which are likely related to alcohol abuse.  2 mg of Ativan given because I suspect patient is withdrawing from alcohol given her tachycardia and now experiencing Some tremors. I will admit the patient for further eval given her intractable nausea vomiting tremors and alcohol withdrawal  ____________________________________________   FINAL CLINICAL IMPRESSION(S) / ED DIAGNOSES  Final diagnoses:  Abdominal pain  Alcohol withdrawal, uncomplicated  Intractable vomiting with nausea, vomiting of unspecified type     Lavonia Drafts, MD 04/16/15 [phone]

## 2015-04-16 NOTE — H&P (Signed)
History and Physical    Kristine Macias VVK:[id number] DOB: 08-18-1986 DOA: 04/16/2015  Referring physician: Dr. Corky Downs PCP: Leonides Sake, MD  Specialists: Dr. Rayann Heman  Chief Complaint: N/V  HPI: Kristine Macias is a 29 y.o. female has a past medical history significant for ETOH abuse now with diffuse abdominal pain with intractable N/V. Stopped drinking 36 hours ago. Tachycardic and tremulous in ER.  Review of Systems: The patient denies, fever, vision loss, decreased hearing, hoarseness, chest pain, syncope, dyspnea on exertion, peripheral edema, balance deficits, hemoptysis melena, hematochezia, hematuria, incontinence, genital sores, muscle weakness, suspicious skin lesions, transient blindness, difficulty walking, depression,  abnormal bleeding, enlarged lymph nodes, angioedema, and breast masses.   Past Medical History  Diagnosis Date  . Anxiety   . Chlamydia 2007  . Irregular heart beat 2010    wt/diet related after evaluation  . Renal disorder   . Pneumothorax, spontaneous, tension   . Alcohol abuse   . Pancreas divisum   . Failure to thrive in adult    Past Surgical History  Procedure Laterality Date  . Cesarean section    . Chest tube insertion    . Esophagogastroduodenoscopy N/A 01/06/2015    Procedure: ESOPHAGOGASTRODUODENOSCOPY (EGD);  Surgeon: Josefine Class, MD;  Location: [facility] ENDOSCOPY;  Service: Endoscopy;  Laterality: N/A;  . Colonoscopy with propofol N/A 01/06/2015    Procedure: COLONOSCOPY WITH PROPOFOL;  Surgeon: Josefine Class, MD;  Location: [facility] ENDOSCOPY;  Service: Endoscopy;  Laterality: N/A;  . Peg placement N/A 01/11/2015    Procedure: PERCUTANEOUS ENDOSCOPIC GASTROSTOMY (PEG) PLACEMENT;  Surgeon: Josefine Class, MD;  Location: [facility] ENDOSCOPY;  Service: Endoscopy;  Laterality: N/A;  . Esophagogastroduodenoscopy N/A 01/11/2015    Rein-normal with PEG placement   Social History:  reports that she has been smoking Cigarettes.  She  has been smoking about 1.00 pack per day. She has never used smokeless tobacco. She reports that she drinks about 21.6 oz of alcohol per week. She reports that she does not use illicit drugs.  Allergies  Allergen Reactions  . Effexor [Venlafaxine] Anaphylaxis  . Sulfa Antibiotics Anaphylaxis  . Tetracyclines & Related Anaphylaxis  . Trazodone And Nefazodone Anaphylaxis  . Latex Hives, Itching and Rash  . Paxil [Paroxetine] Hives, Itching and Rash  . Seroquel [Quetiapine Fumarate] Hives, Itching and Rash  . Zoloft [Sertraline Hcl] Hives, Itching and Rash    Family History  Problem Relation Age of Onset  . Anesthesia problems Neg Hx   . Thyroid disease Mother   . Cancer Father   . Stroke Other   . Crohn's disease Brother     Prior to Admission medications   Medication Sig Start Date End Date Taking? Authorizing Provider  LORazepam (ATIVAN) 1 MG tablet Take 1 tablet (1 mg total) by mouth 2 (two) times daily as needed for anxiety. 03/21/15 03/20/16 Yes Carrie Mew, MD  thiamine 100 MG tablet Take 1 tablet (100 mg total) by mouth daily. 03/03/15  Yes Demetrios Loll, MD  folic acid (FOLVITE) 1 MG tablet Take 1 tablet (1 mg total) by mouth daily. 03/03/15   Demetrios Loll, MD  gabapentin (NEURONTIN) 300 MG capsule Take 1 capsule (300 mg total) by mouth 3 (three) times daily. Patient not taking: Reported on 01/04/2015 11/26/14   Clarene Reamer, MD  HYDROcodone-acetaminophen [facility]) 5-325 MG per tablet Take 1-2 tablets by mouth every 4 (four) hours as needed for pain. Patient not taking: Reported on 11/24/2014 11/24/12   Hazel Sams, PA-C  lipase/protease/amylase (CREON) 36000 UNITS CPEP capsule Take 1 capsule (36,000 Units total) by mouth 3 (three) times daily before meals. Patient not taking: Reported on 03/01/2015 01/13/15   Max Sane, MD  LORazepam (ATIVAN) 1 MG tablet Take 1 tablet (1 mg total) by mouth 2 (two) times daily as needed for anxiety. 01/14/15   Noland Fordyce, PA-C  mirtazapine (REMERON) 30  MG tablet Take 1 tablet (30 mg total) by mouth at bedtime. Patient not taking: Reported on 03/01/2015 01/13/15   Max Sane, MD   Physical Exam: Filed Vitals:   04/16/15 1630 04/16/15 1633 04/16/15 1700 04/16/15 1813  BP: 119/94 119/94 127/94   Pulse: 113 106  112  Temp:      TempSrc:      Resp: 16 14 18    Height:      Weight:      SpO2: 96% 100%       General:  No apparent distress, shaky  Eyes: PERRL, EOMI, no scleral icterus  ENT: moist oropharynx  Neck: supple, no lymphadenopathy  Cardiovascular: rapid rate without MRG; 2+ peripheral pulses, no JVD, no peripheral edema  Respiratory: CTA biL, good air movement without wheezing, rhonchi or crackled  Abdomen: soft, diffusely tender to palpation, positive bowel sounds,  guarding, no rebound  Skin: no rashes  Musculoskeletal: normal bulk and tone, no joint swelling  Psychiatric: normal mood and affect  Neurologic: CN 2-12 grossly intact, MS 5/5 in all 4  Labs on Admission:  Basic Metabolic Panel:  Recent Labs Lab 04/16/15 1305  NA 136  K 3.6  CL 98*  CO2 26  GLUCOSE 130*  BUN 19  CREATININE 0.56  CALCIUM 8.4*   Liver Function Tests:  Recent Labs Lab 04/16/15 1305  AST 567*  ALT 213*  ALKPHOS 165*  BILITOT 2.0*  PROT 7.3  ALBUMIN 3.8    Recent Labs Lab 04/16/15 1305  LIPASE 19*   No results for input(s): AMMONIA in the last 168 hours. CBC:  Recent Labs Lab 04/16/15 1305  WBC 12.8*  NEUTROABS 9.7*  HGB 18.5*  HCT 55.0*  MCV 91.9  PLT 208   Cardiac Enzymes: No results for input(s): CKTOTAL, CKMB, CKMBINDEX, TROPONINI in the last 168 hours.  BNP (last 3 results) No results for input(s): BNP in the last 8760 hours.  ProBNP (last 3 results) No results for input(s): PROBNP in the last 8760 hours.  CBG: No results for input(s): GLUCAP in the last 168 hours.  Radiological Exams on Admission: US Abdomen Limited Ruq  04/16/2015   CLINICAL DATA:  Abdomen pain for 2 days  EXAM: US  ABDOMEN LIMITED - RIGHT UPPER QUADRANT  COMPARISON:  December 20, 2014  FINDINGS: Gallbladder:  No gallstones or wall thickening visualized. The gallbladder is distended. No sonographic Murphy sign noted.  Common bile duct:  Diameter: 2.6 mm  Liver:  No focal lesion identified. Within normal limits in parenchymal echogenicity.  IMPRESSION: Distended gallbladder without gallstones or pericholecystic fluid. No sonographic evidence of acute cholecystitis.   Electronically Signed   By: Abelardo Diesel M.D.   On: 04/16/2015 17:50    EKG: Independently reviewed.  Assessment/Plan Principal Problem:   Abdominal pain, generalized Active Problems:   Alcohol withdrawal   Will admit to floor with CIWA protocol and begin IV fluids and tube feeds. Consult GI. Repeat labs in AM.  Diet: clear liq Fluids: NS@100  DVT Prophylaxis: SQ Heparin  Code Status: FULL  Family Communication: none  Disposition Plan: home  Time spent: 83  min

## 2015-04-16 NOTE — ED Notes (Signed)
Pt returned from ultrasound

## 2015-04-16 NOTE — ED Notes (Signed)
Ativan taken to pt in ultrasound

## 2015-04-16 NOTE — ED Notes (Signed)
States trouble taking po food or fluids, intermittent emesis, x 5 days

## 2015-04-16 NOTE — ED Notes (Signed)
Pt reports that she has been vomiting x 5 days since she "relapsed" on alcohol. She states that she had not had a drink in 3 months since her feeding tube was placed, but she began drinking again and has been unable to keep food down. Pt states she had gotten her weight to 90 ponds but is now 76 pounds Pt alert & oriented and sad. Reports that she is going to see her therapist and start [facility] meetings in the coming week.

## 2015-04-16 NOTE — Progress Notes (Signed)
Spoke with Dr Jannifer Franklin regarding orders for continuous feed.  Pt states she doesn't use continuous feed at home and can only tolerate up to 3 cans of jevity per day.  MD acknowledged and d/c continuous feed.  Pt tolerating small sips of water at this time.  Lynnda Shields, RN

## 2015-04-16 NOTE — ED Notes (Signed)
Patient transported to Ultrasound 

## 2015-04-17 DIAGNOSIS — F410 Panic disorder [episodic paroxysmal anxiety] without agoraphobia: Secondary | ICD-10-CM

## 2015-04-17 DIAGNOSIS — R1084 Generalized abdominal pain: Principal | ICD-10-CM

## 2015-04-17 DIAGNOSIS — F411 Generalized anxiety disorder: Secondary | ICD-10-CM

## 2015-04-17 LAB — COMPREHENSIVE METABOLIC PANEL
ALBUMIN: 2.5 g/dL — AB (ref 3.5–5.0)
ALK PHOS: 114 U/L (ref 38–126)
ALT: 133 U/L — ABNORMAL HIGH (ref 14–54)
ANION GAP: 11 (ref 5–15)
AST: 329 U/L — ABNORMAL HIGH (ref 15–41)
BILIRUBIN TOTAL: 2.8 mg/dL — AB (ref 0.3–1.2)
BUN: 16 mg/dL (ref 6–20)
CALCIUM: 7.1 mg/dL — AB (ref 8.9–10.3)
CO2: 20 mmol/L — ABNORMAL LOW (ref 22–32)
Chloride: 104 mmol/L (ref 101–111)
Creatinine, Ser: 0.6 mg/dL (ref 0.44–1.00)
GFR calc Af Amer: >60 mL/min (ref >60)
GLUCOSE: 65 mg/dL (ref 65–99)
Potassium: 3.1 mmol/L — ABNORMAL LOW (ref 3.5–5.1)
Sodium: 135 mmol/L (ref 135–145)
TOTAL PROTEIN: 5 g/dL — AB (ref 6.5–8.1)

## 2015-04-17 LAB — CBC
HCT: 37.6 % (ref 35.0–47.0)
HEMOGLOBIN: 12.7 g/dL (ref 12.0–16.0)
MCH: 31.4 pg (ref 26.0–34.0)
MCHC: 33.7 g/dL (ref 32.0–36.0)
MCV: 93.2 fL (ref 80.0–100.0)
Platelets: 122 10*3/uL — ABNORMAL LOW (ref 150–440)
RBC: 4.04 MIL/uL (ref 3.80–5.20)
RDW: 14.3 % (ref 11.5–14.5)
WBC: 15 10*3/uL — AB (ref 3.6–11.0)

## 2015-04-17 LAB — PROTIME-INR
INR: 1.02
Prothrombin Time: 13.6 seconds (ref 11.4–15.0)

## 2015-04-17 LAB — HCG, QUANTITATIVE, PREGNANCY: hCG, Beta Chain, Quant, S: 1 m[IU]/mL (ref <5)

## 2015-04-17 MED ORDER — JEVITY 1.5 CAL/FIBER PO LIQD: 240.0000 mL | Freq: Four times a day (QID) | ORAL | Status: DC

## 2015-04-17 MED ORDER — POTASSIUM CHLORIDE IN NACL 20-0.9 MEQ/L-% IV SOLN
INTRAVENOUS | Status: DC
  Administered 2015-04-17 – 2015-04-18 (×2): via INTRAVENOUS
  Filled 2015-04-17 (×4): qty 1000

## 2015-04-17 MED ORDER — MAGNESIUM OXIDE 400 (241.3 MG) MG PO TABS
400.0000 mg | ORAL_TABLET | Freq: Two times a day (BID) | ORAL | Status: DC
  Administered 2015-04-17 – 2015-04-18 (×3): 400 mg via ORAL
  Filled 2015-04-17 (×3): qty 1
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
## 2015-04-17 NOTE — Progress Notes (Signed)
Initial Nutrition Assessment   INTERVENTION:   Coordination of Care: spoke with MD, Earleen Newport about nutrition poc. Will hold off on initiation of TF at this time and monitor tolerance of CL as well as poc. Will readdress tomorrow.  EN: Once appropriate, will recommend Jevity 1.5 bolus with goal rate of atleast 5 cans per day to provide 1776kcals and 75g protein. Will recommend starting at half the goal rate and monitor tolerance.   NUTRITION DIAGNOSIS:   Inadequate oral intake related to altered GI function as evidenced by per patient/family report.  GOAL:   Patient will meet greater than or equal to 90% of their needs  MONITOR:    (Energy Intake, Digestive System, Electrolyte and renal Profile, Anthropometrics)  REASON FOR ASSESSMENT:   Consult Enteral/tube feeding initiation and management  ASSESSMENT:    Pt admitted with n/v and abdominal pain. Pt with PEG tube for supplemental feedings. Pt with EtOH use, on CIWA per MD note, last drinks 36 hours PTA.  Past Medical History  Diagnosis Date  . Anxiety   . Chlamydia 2007  . Irregular heart beat 2010    wt/diet related after evaluation  . Renal disorder   . Pneumothorax, spontaneous, tension   . Alcohol abuse   . Pancreas divisum   . Failure to thrive in adult     Diet Order:  Diet clear liquid Room service appropriate?: Yes; Fluid consistency:: Thin    Current Nutrition: Pt taking sips of water.  Food/Nutrition-Related History: Per Nsg note pt tolerating 3 cans of Jevity 1.5 outpatient via PEG tube PTA.   Medications: NS at 174m/hr, thiamine, protonix, MVI, ativan, colace, folic acid  Electrolyte/Renal Profile and Glucose Profile:   Recent Labs Lab 04/16/15 1305 04/17/15 0525  NA 136 135  K 3.6 3.1*  CL 98* 104  CO2 26 20*  BUN 19 16  CREATININE 0.56 0.60  CALCIUM 8.4* 7.1*  GLUCOSE 130* 65   Protein Profile:  Recent Labs Lab 04/16/15 1305 04/17/15 0525  ALBUMIN 3.8 2.5*   Nutritional Anemia  Profile:  CBC Latest Ref Rng 04/17/2015 04/16/2015 03/21/2015  WBC 3.6 - 11.0 K/uL 15.0(H) 12.8(H) 6.7  Hemoglobin 12.0 - 16.0 g/dL 12.7 18.5(H) 15.6  Hematocrit 35.0 - 47.0 % 37.6 55.0(H) 46.0  Platelets 150 - 440 K/uL 122(L) 208 135(L)   Lipase     Component Value Date/Time   LIPASE 19* 04/16/2015 1305    Gastrointestinal Profile: Last BM: 04/17/2015 watery stool    Nutrition-Focused Physical Exam Findings:  Unable to complete Nutrition-Focused physical exam at this time.    Weight Change: Per [facility], last month pt weight 81lbs.  Height:   Ht Readings from Last 1 Encounters:  04/16/15 5' 6"  (1.676 m)    Weight:   Wt Readings from Last 1 Encounters:  04/17/15 87 lb 11.2 oz (39.78 kg)    Wt Readings from Last 10 Encounters:  04/17/15 87 lb 11.2 oz (39.78 kg)  03/21/15 81 lb (36.741 kg)  03/01/15 92 lb 8 oz (41.958 kg)  01/13/15 95 lb (43.092 kg)  04/24/12 99 lb 3.2 oz (44.997 kg)  04/10/12 99 lb 4.8 oz (45.042 kg)  08/31/11 94 lb 6.4 oz (42.82 kg)    Ideal Body Weight:   59kg  BMI:  Body mass index is 14.16 kg/(m^2).  Estimated Nutritional Needs:   Kcal:  1904-2250kcals, BEE: 1332kcals, (IF: 1.1-1.3)(AF 1.3) using IBW of 59kg  Protein:  59-70g protein (1.0-1.2g/kg) using IBW of 59kg  Fluid:  1475-17742mof  fluid (25-10m/kg) using IBW of 59kg  EDUCATION NEEDS:   No education needs identified at this time  H[place] RD, LDN Pager [phone]

## 2015-04-17 NOTE — Progress Notes (Signed)
Patient ID: Kristine Macias, female   DOB: Aug 04, 1986, 29 y.o.   MRN: [id number] [facility] PROGRESS NOTE  PCP: Leonides Sake, MD  HPI/Subjective: Patient states that she gets 120 pills of Ativan per month. She runs out of these pills prior to the month being over. She states she drinks because she does not want to run out of the Ativan. She is asking when she can go home. She does feel a little shaky from withdrawal of alcohol. She requested to see a psychiatrist. Her nausea vomiting has settled down.  Objective: Filed Vitals:   04/17/15 1141  BP: 99/67  Pulse: 84  Temp: 98 F (36.7 C)  Resp: 17    Intake/Output Summary (Last 24 hours) at 04/17/15 1310 Last data filed at 04/17/15 1227  Gross per 24 hour  Intake 306.67 ml  Output     25 ml  Net 281.67 ml   Filed Weights   13-May-2015 1250 05-13-15 2123 04/17/15 0416  Weight: 34.473 kg (76 lb) 38.102 kg (84 lb) 39.78 kg (87 lb 11.2 oz)    ROS: Review of Systems  Constitutional: Negative for fever and chills.  Eyes: Negative for blurred vision.  Respiratory: Negative for cough and shortness of breath.   Cardiovascular: Negative for chest pain.  Gastrointestinal: Negative for nausea, vomiting, abdominal pain, diarrhea and constipation.  Genitourinary: Negative for dysuria.  Musculoskeletal: Negative for joint pain.  Neurological: Negative for dizziness and headaches.   Exam: Physical Exam  Constitutional: She is oriented to person, place, and time.  HENT:  Nose: No mucosal edema.  Mouth/Throat: No oropharyngeal exudate or posterior oropharyngeal edema.  Eyes: Conjunctivae, EOM and lids are normal. Pupils are equal, round, and reactive to light.  Neck: No JVD present. Carotid bruit is not present. No edema present. No thyroid mass and no thyromegaly present.  Cardiovascular: S1 normal and S2 normal.  Exam reveals no gallop.   No murmur heard. Pulses:      Dorsalis pedis pulses are 2+ on the right  side, and 2+ on the left side.  Respiratory: No respiratory distress. She has no wheezes. She has no rhonchi. She has no rales.  GI: Soft. Bowel sounds are normal. There is no tenderness.  Musculoskeletal:       Right ankle: She exhibits no swelling.       Left ankle: She exhibits no swelling.  Lymphadenopathy:    She has no cervical adenopathy.  Neurological: She is alert and oriented to person, place, and time. No cranial nerve deficit.  Skin: Skin is warm. No rash noted. Nails show no clubbing.  Psychiatric: Her mood appears anxious.    Data Reviewed: Basic Metabolic Panel:  Recent Labs Lab 2015-05-13 1305 04/17/15 0525  NA 136 135  K 3.6 3.1*  CL 98* 104  CO2 26 20*  GLUCOSE 130* 65  BUN 19 16  CREATININE 0.56 0.60  CALCIUM 8.4* 7.1*   Liver Function Tests:  Recent Labs Lab 13-May-2015 1305 04/17/15 0525  AST 567* 329*  ALT 213* 133*  ALKPHOS 165* 114  BILITOT 2.0* 2.8*  PROT 7.3 5.0*  ALBUMIN 3.8 2.5*    Recent Labs Lab May 13, 2015 1305  LIPASE 19*   CBC:  Recent Labs Lab May 13, 2015 1305 04/17/15 0525  WBC 12.8* 15.0*  NEUTROABS 9.7*  --   HGB 18.5* 12.7  HCT 55.0* 37.6  MCV 91.9 93.2  PLT 208 122*     Studies: US Abdomen Limited Ruq  13-May-2015  CLINICAL DATA:  Abdomen pain for 2 days  EXAM: US ABDOMEN LIMITED - RIGHT UPPER QUADRANT  COMPARISON:  December 20, 2014  FINDINGS: Gallbladder:  No gallstones or wall thickening visualized. The gallbladder is distended. No sonographic Murphy sign noted.  Common bile duct:  Diameter: 2.6 mm  Liver:  No focal lesion identified. Within normal limits in parenchymal echogenicity.  IMPRESSION: Distended gallbladder without gallstones or pericholecystic fluid. No sonographic evidence of acute cholecystitis.   Electronically Signed   By: Abelardo Diesel M.D.   On: 04/16/2015 17:50    Scheduled Meds: . cefTRIAXone (ROCEPHIN)  IV  1 g Intravenous Q24H  . docusate sodium  100 mg Oral BID  . folic acid  1 mg Oral Daily  .  heparin  5,000 Units Subcutaneous 3 times per day  . LORazepam  0-4 mg Intravenous Q6H   Followed by  . [START ON 04/18/2015] LORazepam  0-4 mg Intravenous Q12H  . magnesium oxide  400 mg Oral BID  . multivitamin with minerals  1 tablet Oral Daily  . pantoprazole (PROTONIX) IV  40 mg Intravenous Q12H  . sodium chloride  3 mL Intravenous Q12H  . thiamine  100 mg Oral Daily   Or  . thiamine  100 mg Intravenous Daily   Continuous Infusions: . 0.9 % NaCl with KCl 20 mEq / L      Assessment/Plan:  1. Generalized abdominal pain with nausea vomiting. This has settled down. Restart regular diet. 2. Alcohol abuse, slight tremor, elevated liver function test. I advised that she must stop drinking alcohol. 3. Hypokalemia- replace potassium in IV fluids, check magnesium in the a.m. and replace with magnesium sulfate. 4. Anxiety disorder- she states that she has a psychiatrist set up for September. She requested to see a psychiatrist here. I told her that Ativan and alcohol are not a good combination. I will only prescribe her know if Ativan to get to her medical appointment on the 29th. She needs to stop self-medicating with alcohol. 5. Patient has a PEG for extra nutrition. I think one of the main issues is his we need to stop getting calories from alcohol.  Code Status:     Code Status Orders        Start     Ordered   04/16/15 2122  Full code   Continuous     04/16/15 2121      Disposition Plan: Home soon  Time spent: 28 minutes  Loletha Grayer  [facility]

## 2015-04-17 NOTE — Consult Note (Addendum)
[facility] Consult   Reason for Consult:  "Anxiety," per pt request Referring Physician:  Marval Regal, NP  Patient Identification: Kristine Macias MRN:  [id number]   Principal Diagnosis: Abdominal pain, generalized Diagnosis:   Patient Active Problem List   Diagnosis Date Noted  . Alcohol withdrawal [F10.239] 04/16/2015  . Abdominal pain, generalized [R10.84] 04/16/2015  . Vomiting and diarrhea [R11.10, R19.7]   . Failure to thrive in adult [R62.7]   . Intractable nausea and vomiting [R11.10] 03/01/2015  . Hypokalemia [E87.6] 03/01/2015  . Protein-calorie malnutrition, severe [E43] 01/05/2015  . Alcohol abuse [F10.10]   . Generalized anxiety disorder [F41.1] 11/26/2014    Class: Chronic  . Alcohol use disorder, severe, dependence [F10.20] 11/25/2014  . Adnexal mass [N94.9] 04/10/2012    Total Time spent with patient: 45 minutes  Subjective:   Kristine Macias is a 29 y.o. female with a h/o alcohol use disorder, severe who was admitted to [facility] for diffuse abdominal pain with intractable nausea and vomiting. Pt was tachycardic and tremulous in the ER. She admitted on 04-16-2015 and the pt shared she her last drink was 36 hours prior to admission. Of note, pt's 04-16-2015 lipase level was low at 19. CMP demonstrated markedly elevated LFTs, elevated glucose of 130 and low chloride of 98 (ref range 101-111). Total bilirubin is 2 which is elevated. CBC with diff revealed elevated hemoglobin at 18 initially which decreased to 12.7 by 04-17-2015. PTT and INR are wnL. 04-17-15 platelets were low at 122. Labs pending at the time of this note include: ANA, hepatitis panel, anti-smooth muscle antibodies and mitochondrial antibodies. Pt's hypokalemia being corrected by IV replacement. 8-20-216 RUQ U/S demonstrated: Distended gallbladder without gallstones or pericholecystic fluid. No sonographic evidence of acute cholecystitis.  Pt requested the consult today because she is  anxious, consumes alcohol and per Dr. Marshia Ly progress note, the pt runs out of Ativan prior the end of the her 30 day supply. The pt shared with Dr. Leslye Peer she receives 120 pills of Ativan per month.   Nursing notes were reviewed. Pt is taking small sips of water. Also shared with nursing she starting drinking 5 days ago after 3 months of sobriety since her feeing tube was laced. She stated to drink again because she was unable to keep food down. Weight is currently 76 pounds. Pt reported she will start to see a therapist and wants to attend [place] meetings.   On interview today, Ms. Suder shared she lost too much weight recently & weak. She relates this to anxiety. Pt states she was diagnosed with GAD at 29yo and PTSD at 29yo.  Pt shared she did well for about 10 years until her father became ill in 2013/04/11 and died in 05/03/2013. In 2013-04-11, due to her father's illness, she started drinking alcohol and gradually increased the amount she was drinking. She was told by a friend that beer would help to decrease anxiety and panic attacks. Currently, the pt is drinking up to a 6 pack of beer per day.  She was prescribed Xanax in the past but is currently using Ativan 49m QID. She is prescribed 120 pills per month and will run out of Ativan prior to her next prescription. She denies craving and seeking Ativan or other benzodiazepines but will drink alcohol to prevent withdrawal seizures. The pt shared she had seizures on 2 separate occasions when withdrawing from Ativan.   She shared she became addicted to alcohol when  she started drinking about 2 years ago. She states when she stops using alcohol, she will have withdrawal including anxiety and tremulous. Pt shared her longest period of sobriety was from March 2015 when she was "detoxed" at [facility] to November 2015. She then started drinking again after her husband began to make fun of her due to weight loss which she related to Crohn's disease with  potassium deficiency.  Due to her husband making fun of her, the pt would consume 1 beer prior to his coming home. She presented to [facility] for detox again and discontinued alcohol use until March 2015. She started consuming alcohol again on November 24, 2014 when her husband left her due to her alcohol use. 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  Due to Crohn's disease, the pt shared she had a j-tube placed for feeding and would drop weight when she consumed alcohol.  Pt denies craving alcohol and denies a tolerance to alcohol. She denies having lost any property because of alcohol but has lost her marriage as noted above. Pt denies developing a tolerance to alcohol.    She participated in rehab in [place], [place] which she noted was helpful. Pt does not want to participate in rehab again at this time. Pt has a therapist at [facility] in [place]. She has a psychiatrist appt at the same facility in mid September. Pt wants to get reestablished with psychiatric and psychotherapeutic care. Pt endorses helplessness "sometimes" but not currently. Pt also denies hopelessness, worthlessness, SI, HI and AVH. Pt denies a h/o suicide attempts.   Pt describes anxiety as rapid breathing, dyspnea, blurry vision, body tingles, palpitations, decreased concentration and her mind going blank. In the past, when she was diagnosed with PTSD and GAD, the pt participated with psychotherapy and noted the anxiety improved. She shared that she   Weapons at home Denies  HPI Elements:   Location:  see HPI. Quality:  stable. Severity:  moderate. Timing:  past 5 days. Duration:  since July 2014. Context:  started drinking again when she was not able to hold down food, death of father, separation from husband. .  Past Medical History:  Past Medical History  Diagnosis Date  . Anxiety   .  Chlamydia 2007  . Irregular heart beat 2010    wt/diet related after evaluation  . Renal disorder   . Pneumothorax, spontaneous, tension   . Alcohol abuse   . Pancreas divisum   . Failure to thrive in adult     Past Surgical History  Procedure Laterality Date  . Cesarean section    . Chest tube insertion    . Esophagogastroduodenoscopy N/A 01/06/2015    Procedure: ESOPHAGOGASTRODUODENOSCOPY (EGD);  Surgeon: Josefine Class, MD;  Location: [facility] ENDOSCOPY;  Service: Endoscopy;  Laterality: N/A;  . Colonoscopy with propofol N/A 01/06/2015    Procedure: COLONOSCOPY WITH PROPOFOL;  Surgeon: Josefine Class, MD;  Location: [facility] ENDOSCOPY;  Service: Endoscopy;  Laterality: N/A;  . Peg placement N/A 01/11/2015    Procedure: PERCUTANEOUS ENDOSCOPIC GASTROSTOMY (PEG) PLACEMENT;  Surgeon: Josefine Class, MD;  Location: [facility] ENDOSCOPY;  Service: Endoscopy;  Laterality: N/A;  . Esophagogastroduodenoscopy N/A 01/11/2015    Rein-normal with PEG placement   Family History:  Family History  Problem Relation Age of Onset  . Anesthesia problems Neg Hx   .  Thyroid disease Mother   . Cancer Father   . Stroke Other   . Crohn's disease Brother    Social History:  History  Alcohol Use  . 21.6 oz/week  . 42 Cans of beer per week    Comment: 6 beers/day     History  Drug Use No    Social History   Social History  . Marital Status: Single    Spouse Name: N/A  . Number of Children: N/A  . Years of Education: N/A   Social History Main Topics  . Smoking status: Current Every Day Smoker -- 1.00 packs/day    Types: Cigarettes  . Smokeless tobacco: Never Used  . Alcohol Use: 21.6 oz/week    36 Cans of beer per week     Comment: 6 beers/day  . Drug Use: No  . Sexual Activity: Yes    Birth Control/ Protection: None   Other Topics Concern  . None   Social History Narrative   Lives with mother & 2 children   disabled   Additional Social History:  Allergies:   Allergies   Allergen Reactions  . Effexor [Venlafaxine] Anaphylaxis  . Sulfa Antibiotics Anaphylaxis  . Tetracyclines & Related Anaphylaxis  . Trazodone And Nefazodone Anaphylaxis  . Latex Hives, Itching and Rash  . Paxil [Paroxetine] Hives, Itching and Rash  . Seroquel [Quetiapine Fumarate] Hives, Itching and Rash  . Zoloft [Sertraline Hcl] Hives, Itching and Rash    Labs:  Results for orders placed or performed during the hospital encounter of 04/16/15 (from the past 48 hour(s))  CBC WITH DIFFERENTIAL     Status: Abnormal   Collection Time: 04/16/15  1:05 PM  Result Value Ref Range   WBC 12.8 (H) 3.6 - 11.0 K/uL   RBC 5.98 (H) 3.80 - 5.20 MIL/uL   Hemoglobin 18.5 (H) 12.0 - 16.0 g/dL   HCT 55.0 (H) 35.0 - 47.0 %   MCV 91.9 80.0 - 100.0 fL   MCH 30.9 26.0 - 34.0 pg   MCHC 33.6 32.0 - 36.0 g/dL   RDW 14.6 (H) 11.5 - 14.5 %   Platelets 208 150 - 440 K/uL   Neutrophils Relative % 75 %   Neutro Abs 9.7 (H) 1.4 - 6.5 K/uL   Lymphocytes Relative 19 %   Lymphs Abs 2.4 1.0 - 3.6 K/uL   Monocytes Relative 5 %   Monocytes Absolute 0.6 0.2 - 0.9 K/uL   Eosinophils Relative 1 %   Eosinophils Absolute 0.1 0 - 0.7 K/uL   Basophils Relative 0 %   Basophils Absolute 0.0 0 - 0.1 K/uL  Comprehensive metabolic panel     Status: Abnormal   Collection Time: 04/16/15  1:05 PM  Result Value Ref Range   Sodium 136 135 - 145 mmol/L   Potassium 3.6 3.5 - 5.1 mmol/L   Chloride 98 (L) 101 - 111 mmol/L   CO2 26 22 - 32 mmol/L   Glucose, Bld 130 (H) 65 - 99 mg/dL   BUN 19 6 - 20 mg/dL   Creatinine, Ser 0.56 0.44 - 1.00 mg/dL   Calcium 8.4 (L) 8.9 - 10.3 mg/dL   Total Protein 7.3 6.5 - 8.1 g/dL   Albumin 3.8 3.5 - 5.0 g/dL   AST 567 (H) 15 - 41 U/L   ALT 213 (H) 14 - 54 U/L   Alkaline Phosphatase 165 (H) 38 - 126 U/L   Total Bilirubin 2.0 (H) 0.3 - 1.2 mg/dL   GFR calc non Af  Amer >60 >60 mL/min   GFR calc Af Amer >60 >60 mL/min    Comment: (NOTE) The eGFR has been calculated using the CKD EPI  equation. This calculation has not been validated in all clinical situations. eGFR's persistently <60 mL/min signify possible Chronic Kidney Disease.    Anion gap 12 5 - 15  Lipase, blood     Status: Abnormal   Collection Time: 04/16/15  1:05 PM  Result Value Ref Range   Lipase 19 (L) 22 - 51 U/L  Urinalysis complete, with microscopic ([facility] only)     Status: Abnormal   Collection Time: 04/16/15  6:29 PM  Result Value Ref Range   Color, Urine AMBER (A) YELLOW   APPearance HAZY (A) CLEAR   Glucose, UA NEGATIVE NEGATIVE mg/dL   Bilirubin Urine NEGATIVE NEGATIVE   Ketones, ur TRACE (A) NEGATIVE mg/dL   Specific Gravity, Urine 1.021 1.005 - 1.030   Hgb urine dipstick 3+ (A) NEGATIVE   pH 5.0 5.0 - 8.0   Protein, ur 100 (A) NEGATIVE mg/dL   Nitrite NEGATIVE NEGATIVE   Leukocytes, UA NEGATIVE NEGATIVE   RBC / HPF TOO NUMEROUS TO COUNT 0 - 5 RBC/hpf   WBC, UA TOO NUMEROUS TO COUNT 0 - 5 WBC/hpf   Bacteria, UA RARE (A) NONE SEEN   Squamous Epithelial / LPF 0-5 (A) NONE SEEN   Mucous PRESENT    Hyaline Casts, UA PRESENT   Comprehensive metabolic panel     Status: Abnormal   Collection Time: 04/17/15  5:25 AM  Result Value Ref Range   Sodium 135 135 - 145 mmol/L   Potassium 3.1 (L) 3.5 - 5.1 mmol/L   Chloride 104 101 - 111 mmol/L   CO2 20 (L) 22 - 32 mmol/L   Glucose, Bld 65 65 - 99 mg/dL   BUN 16 6 - 20 mg/dL   Creatinine, Ser 0.60 0.44 - 1.00 mg/dL   Calcium 7.1 (L) 8.9 - 10.3 mg/dL   Total Protein 5.0 (L) 6.5 - 8.1 g/dL   Albumin 2.5 (L) 3.5 - 5.0 g/dL   AST 329 (H) 15 - 41 U/L   ALT 133 (H) 14 - 54 U/L   Alkaline Phosphatase 114 38 - 126 U/L   Total Bilirubin 2.8 (H) 0.3 - 1.2 mg/dL   GFR calc non Af Amer >60 >60 mL/min   GFR calc Af Amer >60 >60 mL/min    Comment: (NOTE) The eGFR has been calculated using the CKD EPI equation. This calculation has not been validated in all clinical situations. eGFR's persistently <60 mL/min signify possible Chronic Kidney Disease.     Anion gap 11 5 - 15  CBC     Status: Abnormal   Collection Time: 04/17/15  5:25 AM  Result Value Ref Range   WBC 15.0 (H) 3.6 - 11.0 K/uL   RBC 4.04 3.80 - 5.20 MIL/uL   Hemoglobin 12.7 12.0 - 16.0 g/dL    Comment: DELTA CHECK NOTED   HCT 37.6 35.0 - 47.0 %   MCV 93.2 80.0 - 100.0 fL   MCH 31.4 26.0 - 34.0 pg   MCHC 33.7 32.0 - 36.0 g/dL   RDW 14.3 11.5 - 14.5 %   Platelets 122 (L) 150 - 440 K/uL  hCG, quantitative, pregnancy     Status: None   Collection Time: 04/17/15  5:25 AM  Result Value Ref Range   hCG, Beta Chain, Quant, S 1 <5 mIU/mL    Comment:  GEST. AGE      CONC.  (mIU/mL)   <=1 WEEK        5 - 50     2 WEEKS       50 - 500     3 WEEKS       100 - 10,000     4 WEEKS     1,000 - 30,000     5 WEEKS     3,500 - 115,000   6-8 WEEKS     12,000 - 270,000    12 WEEKS     15,000 - 220,000        FEMALE AND NON-PREGNANT FEMALE:     LESS THAN 5 mIU/mL   Protime-INR     Status: None   Collection Time: 04/17/15  1:19 PM  Result Value Ref Range   Prothrombin Time 13.6 11.4 - 15.0 seconds   INR 1.02     Vitals: Blood pressure 99/67, pulse 84, temperature 98 F (36.7 C), temperature source Oral, resp. rate 17, height 5' 6"  (1.676 m), weight 39.78 kg (87 lb 11.2 oz), last menstrual period 04/16/2015, SpO2 99 %.  Risk to Self: Is patient at risk for suicide?: No Risk to Others:   Prior Inpatient Therapy:   Prior Outpatient Therapy:    Current Facility-Administered Medications  Medication Dose Route Frequency Provider Last Rate Last Dose  . 0.9 % NaCl with KCl 20 mEq/ L  infusion   Intravenous Continuous Loletha Grayer, MD 75 mL/hr at 04/17/15 1431    . acetaminophen (TYLENOL) tablet 650 mg  650 mg Oral Q6H PRN Idelle Crouch, MD       Or  . acetaminophen (TYLENOL) suppository 650 mg  650 mg Rectal Q6H PRN Idelle Crouch, MD      . cefTRIAXone (ROCEPHIN) 1 g in dextrose 5 % 50 mL IVPB  1 g Intravenous Q24H Idelle Crouch, MD   1 g at 04/16/15 2052  .  docusate sodium (COLACE) capsule 100 mg  100 mg Oral BID Idelle Crouch, MD   100 mg at 04/16/15 2200  . folic acid (FOLVITE) tablet 1 mg  1 mg Oral Daily Idelle Crouch, MD   1 mg at 04/17/15 1003  . heparin injection 5,000 Units  5,000 Units Subcutaneous 3 times per day Idelle Crouch, MD   5,000 Units at 04/16/15 2200  . HYDROmorphone (DILAUDID) injection 1 mg  1 mg Intravenous Q4H PRN Idelle Crouch, MD      . LORazepam (ATIVAN) injection 0-4 mg  0-4 mg Intravenous Q6H Idelle Crouch, MD   2 mg at 04/17/15 1313   Followed by  . [START ON 04/18/2015] LORazepam (ATIVAN) injection 0-4 mg  0-4 mg Intravenous Q12H Idelle Crouch, MD      . LORazepam (ATIVAN) tablet 1 mg  1 mg Oral Q6H PRN Idelle Crouch, MD       Or  . LORazepam (ATIVAN) injection 1 mg  1 mg Intravenous Q6H PRN Idelle Crouch, MD   1 mg at 04/16/15 2137  . magnesium oxide (MAG-OX) tablet 400 mg  400 mg Oral BID Loletha Grayer, MD   400 mg at 04/17/15 1314  . multivitamin with minerals tablet 1 tablet  1 tablet Oral Daily Idelle Crouch, MD   1 tablet at 04/17/15 1003  . ondansetron (ZOFRAN) tablet 4 mg  4 mg Oral Q6H PRN Idelle Crouch, MD  Or  . ondansetron (ZOFRAN) injection 4 mg  4 mg Intravenous Q6H PRN Idelle Crouch, MD   4 mg at 04/17/15 0123  . pantoprazole (PROTONIX) injection 40 mg  40 mg Intravenous Q12H Idelle Crouch, MD   40 mg at 04/17/15 1002  . sodium chloride 0.9 % injection 3 mL  3 mL Intravenous Q12H Idelle Crouch, MD   3 mL at 04/16/15 2200  . thiamine (VITAMIN B-1) tablet 100 mg  100 mg Oral Daily Idelle Crouch, MD       Or  . thiamine (B-1) injection 100 mg  100 mg Intravenous Daily Idelle Crouch, MD   100 mg at 04/17/15 1313    Musculoskeletal: Strength & Muscle Tone: decreased Gait & Station: not assessed due to pt's weakness Patient leans: N/A  Psychiatric Specialty Exam: Physical Exam  Review of Systems  Constitutional: Positive for weight loss. Negative  for fever, chills, malaise/fatigue and diaphoresis.  HENT: Negative.   Eyes: Negative.   Respiratory: Negative.   Cardiovascular: Negative.   Gastrointestinal: Positive for nausea, vomiting and abdominal pain. Negative for heartburn, diarrhea, constipation, blood in stool and melena.  Genitourinary: Negative.   Musculoskeletal: Negative.   Skin: Negative.  Negative for itching and rash.  Neurological: Positive for weakness.  Endo/Heme/Allergies: Negative.   Psychiatric/Behavioral: Positive for substance abuse. Negative for depression, suicidal ideas, hallucinations and memory loss. The patient is nervous/anxious and has insomnia.     Blood pressure 99/67, pulse 84, temperature 98 F (36.7 C), temperature source Oral, resp. rate 17, height 5' 6"  (1.676 m), weight 39.78 kg (87 lb 11.2 oz), last menstrual period 04/16/2015, SpO2 99 %.Body mass index is 14.16 kg/(m^2).  General Appearance: Casual and Neat  Eye Contact::  Good  Speech:  Clear and Coherent and Normal Rate  Volume:  Normal  Mood:  Depressed  Affect:  Tearful  Thought Process:  Coherent, Goal Directed, Intact, Linear and Logical  Orientation:  Full (Time, Place, and Person)  Thought Content:  Negative  Suicidal Thoughts:  No  Homicidal Thoughts:  No  Memory:  Immediate;   Good Recent;   Good Remote;   Good  Judgement:  Fair  Insight:  Fair  Psychomotor Activity:  Normal  Concentration:  Good  Recall:  Good  Fund of Knowledge:Good  Language: Good  Akathisia:  Negative  Handed:  Not asked  AIMS (if indicated):  N/A  Assets:  Communication Skills Desire for Improvement Financial Resources/Insurance Housing Resilience Social Support Talents/Skills Transportation  ADL's:  Intact  Cognition: WNL  Sleep:  stable   Medical Decision Making: Established Problem, Stable/Improving (1), Review of Psycho-Social Stressors (1), Review or order clinical lab tests (1), Review or order medicine tests (1), Review of Medication  Regimen & Side Effects (2) and Review of New Medication or Change in Dosage (2)  Treatment Plan Summary: Plan patient does not require inpatient psychiatric admission at this time.   1. Pt will need to have her psychotherapy and psychiatric appt at [facility] moved to an earlier time.  2. Pt should attend AA as she is scheduled to do so.  3. Pt does not want to attend inpatient rehab at this time.  4. Please prescribe pt enough Ativan to get her to her PCP's appt on April 25, 2015.  5. Continue CIWA protocol per and continue to correct underlying medical issues.   Plan:  No evidence of imminent risk to self or others at present.  Patient does not meet criteria for psychiatric inpatient admission. Supportive therapy provided about ongoing stressors. Discussed crisis plan, support from social network, calling 911, coming to the Emergency Department, and calling Suicide Hotline.  Disposition: Home once medically stable.   Donita Brooks 04/17/2015 2:41 PM

## 2015-04-18 LAB — BASIC METABOLIC PANEL
Anion gap: 6 (ref 5–15)
BUN: <5 mg/dL — ABNORMAL LOW (ref 6–20)
CO2: 26 mmol/L (ref 22–32)
Calcium: 7.6 mg/dL — ABNORMAL LOW (ref 8.9–10.3)
Chloride: 108 mmol/L (ref 101–111)
Creatinine, Ser: 0.45 mg/dL (ref 0.44–1.00)
GFR calc Af Amer: >60 mL/min (ref >60)
GFR calc non Af Amer: >60 mL/min (ref >60)
Glucose, Bld: 85 mg/dL (ref 65–99)
Potassium: 3.3 mmol/L — ABNORMAL LOW (ref 3.5–5.1)
Sodium: 140 mmol/L (ref 135–145)

## 2015-04-18 LAB — HEPATIC FUNCTION PANEL
ALT: 94 U/L — ABNORMAL HIGH (ref 14–54)
AST: 172 U/L — ABNORMAL HIGH (ref 15–41)
Albumin: 2.6 g/dL — ABNORMAL LOW (ref 3.5–5.0)
Alkaline Phosphatase: 112 U/L (ref 38–126)
Bilirubin, Direct: 0.6 mg/dL — ABNORMAL HIGH (ref 0.1–0.5)
Indirect Bilirubin: 0.7 mg/dL (ref 0.3–0.9)
Total Bilirubin: 1.3 mg/dL — ABNORMAL HIGH (ref 0.3–1.2)
Total Protein: 5.2 g/dL — ABNORMAL LOW (ref 6.5–8.1)

## 2015-04-18 LAB — MAGNESIUM: Magnesium: 1.6 mg/dL — ABNORMAL LOW (ref 1.7–2.4)

## 2015-04-18 MED ORDER — MAGNESIUM OXIDE -MG SUPPLEMENT 400 (240 MG) MG PO TABS: 400.0000 mg | ORAL_TABLET | Freq: Two times a day (BID) | ORAL | Status: DC

## 2015-04-18 MED ORDER — CEPHALEXIN 500 MG PO CAPS: 500.0000 mg | ORAL_CAPSULE | Freq: Three times a day (TID) | ORAL | Status: DC

## 2015-04-18 MED ORDER — JEVITY 1.5 CAL/FIBER PO LIQD: 240.0000 mL | Freq: Four times a day (QID) | ORAL | Status: DC

## 2015-04-18 MED ORDER — LORAZEPAM 1 MG PO TABS: 1.0000 mg | ORAL_TABLET | Freq: Four times a day (QID) | ORAL | Status: DC | PRN

## 2015-04-18 MED ORDER — OMEPRAZOLE 20 MG PO CPDR: 20.0000 mg | DELAYED_RELEASE_CAPSULE | Freq: Every day | ORAL | Status: DC

## 2015-04-18 MED ORDER — POTASSIUM CHLORIDE ER 10 MEQ PO TBCR: 10.0000 meq | EXTENDED_RELEASE_TABLET | Freq: Two times a day (BID) | ORAL | Status: DC

## 2015-04-18 MED ORDER — MAGNESIUM SULFATE 2 GM/50ML IV SOLN
2.0000 g | Freq: Once | INTRAVENOUS | Status: AC
  Administered 2015-04-18: 2 g via INTRAVENOUS
  Filled 2015-04-18: qty 50

## 2015-04-18 NOTE — Clinical Social Work Note (Signed)
CSW spoke to pt.  She was A&O.  She stated that she was planning on returning to her [place] meetings once she was able to leave the hospital.  Pt stated that she had a drink daily.  1-2 beers a day.  She estimated that it had been 24hrs before being admitted to the hospital since she has last had a drink.  SHe also stated that she was still in bereavement counseling for the death of her father 2 years ago.  CSW asked if she would like information about [facility] services.  Pt accepted this information.  CSW also gave her additional information about AA meetings.  SHe stated that she was not interested in rehab programs, but instead she only wanted to continue with AA meetings.  CSW also confirmed that pt's mother helps pt with the care of her children.  No signs of abuse and pt's mother watches the children when pt was under the influence of alcohol.  CSW signing off.

## 2015-04-18 NOTE — Progress Notes (Signed)
Nutrition Follow-up   INTERVENTION:   Coordination of Care: Recommend outpatient dietitian referral for further medical nutrition therapy, secondary to poor po intake and weight loss, MD Weiting agreeable. Although pt somewhat stable on TF boluses, the pt takes po however likely not enough. TF has been increased by one bolus per GI from what pt was doing PTA. MD Earleen Newport confirmed prescription on discharge was 4 cans Jevity 1.5 per day; providing 1422kcals, 61g protein and 720-1164m of free water depending on free water flushes before and after (meeting 75% of estimated kcals and 90-100% of protein needs). RD called to [facility] aware of pending referral. Plan for discharge today per MD. Pt agreeable to seeing outpatient dietitian.   NUTRITION DIAGNOSIS:   Inadequate oral intake related to altered GI function as evidenced by per patient/family report.  GOAL:   Patient will meet greater than or equal to 90% of their needs  MONITOR:    (Energy Intake, Digestive System, Electrolyte and renal Profile, Anthropometrics)  REASON FOR ASSESSMENT:   Consult Enteral/tube feeding initiation and management  ASSESSMENT:    Pt to be discharged today. GI and Pysch following.   Diet Order:  Diet regular Room service appropriate?: Yes; Fluid consistency:: Thin    Current Nutrition: Pt reports eating bites of meals. Pt reports not getting tube feeding currently as equipment not adequate to give boluses.   Food/Nutrition-Related History: Pt reports eating small frequent meals such as hummus, olives, popcorn between TF boluses. Pt reports taking Jevity 1.5 TID most days PTA. Per MD note, pt drinking 6 beers per day PTA. Pt reports getting full with boluses TID and does not want to eat. Pt gives vague input to actual dietary recall.    Gastrointestinal Profile: Last BM: 04/17/2015   Medications: Colace, atiavn, Mag-Ox, MVI, Protonix, folic acid, thiamine  Electrolyte/Renal Profile and  Glucose Profile:   Recent Labs Lab 04/16/15 1305 04/17/15 0525 04/18/15 0401  NA 136 135 140  K 3.6 3.1* 3.3*  CL 98* 104 108  CO2 26 20* 26  BUN 19 16 <5*  CREATININE 0.56 0.60 0.45  CALCIUM 8.4* 7.1* 7.6*  MG  --   --  1.6*  GLUCOSE 130* 65 85   Protein Profile:  Recent Labs Lab 04/16/15 1305 04/17/15 0525 04/18/15 0401  ALBUMIN 3.8 2.5* 2.6*     Weight Trend since Admission: Filed Weights   04/16/15 2123 04/17/15 0416 04/18/15 0455  Weight: 84 lb (38.102 kg) 87 lb 11.2 oz (39.78 kg) 86 lb 1.6 oz (39.055 kg)    Ideal Body Weight:   59kg  BMI:  Body mass index is 13.9 kg/(m^2).  Estimated Nutritional Needs:   Kcal:  1904-2250kcals, BEE: 1332kcals, (IF: 1.1-1.3)(AF 1.3) using IBW of 59kg  Protein:  59-70g protein (1.0-1.2g/kg) using IBW of 59kg  Fluid:  1475-17728mof fluid (25-3063mg) using IBW of 59kg  EDUCATION NEEDS:   No education needs identified at this time  HIGH Care Level AllDwyane LuoD, LDN Pager [phone]

## 2015-04-18 NOTE — Care Management (Signed)
Notified [facility] that patient is being discharged home today.  Notified [facility].  Requested resumption of care orders for home health nursing from attending

## 2015-04-18 NOTE — Discharge Summary (Signed)
[facility] NAME: Kristine Macias    MR#:  [id number]  DATE OF BIRTH:  [birth year]  DATE OF ADMISSION:  04/16/2015 ADMITTING PHYSICIAN: Idelle Crouch, MD  DATE OF DISCHARGE: 04/18/2015  PRIMARY CARE PHYSICIAN: Leonides Sake, MD    ADMISSION DIAGNOSIS:  Abdominal pain [R10.9] Alcohol withdrawal, uncomplicated [O13.086] Intractable vomiting with nausea, vomiting of unspecified type [R11.10]  DISCHARGE DIAGNOSIS:  Principal Problem:   Abdominal pain, generalized Active Problems:   Alcohol withdrawal   GAD (generalized anxiety disorder)   Panic disorder   SECONDARY DIAGNOSIS:   Past Medical History  Diagnosis Date  . Anxiety   . Chlamydia 2007  . Irregular heart beat 2010    wt/diet related after evaluation  . Renal disorder   . Pneumothorax, spontaneous, tension   . Alcohol abuse   . Pancreas divisum   . Failure to thrive in adult     HOSPITAL COURSE:   1. Nausea vomiting generalized abdominal pain. The nausea vomiting has settled down and she is tolerating oral diet and wants to go home. 2. Alcohol abuse- the main issue here is the patient runs out of her Ativan prior to a month and then starts medicating with alcohol. Patient currently not in withdrawal. 3. Chronic anxiety. Allergies to multiple medications. I prescribed 8 days of Ativan 1 mg every 6 hours as needed for anxiety. This will have enough medication so she doesn't run out with her primary care physician. Further talking with her primary care physician as needed about the amount of Ativan. She will need psychiatric following as outpatient. Suspect eating disorder also. 4. Hypokalemia and hypomagnesemia- replace IV magnesium prior to discharge and oral magnesium and potassium upon discharge. 5. Follow-up with Dr. Rayann Heman gastroenterology as outpatient. Continue PEG feedings 4 times a day. [facility] dietitian follow-up also needed. 6.  Possible acute cystitis without hematuria-no urine culture sent was given IV Rocephin here will finish a course with Keflex.   DISCHARGE CONDITIONS:   Fair.  CONSULTS OBTAINED:  Treatment Team:  Manya Silvas, MD Donita Brooks, MD  DRUG ALLERGIES:   Allergies  Allergen Reactions  . Effexor [Venlafaxine] Anaphylaxis  . Sulfa Antibiotics Anaphylaxis  . Tetracyclines & Related Anaphylaxis  . Trazodone And Nefazodone Anaphylaxis  . Latex Hives, Itching and Rash  . Paxil [Paroxetine] Hives, Itching and Rash  . Seroquel [Quetiapine Fumarate] Hives, Itching and Rash  . Zoloft [Sertraline Hcl] Hives, Itching and Rash    DISCHARGE MEDICATIONS:   Current Discharge Medication List    START taking these medications   Details  cephALEXin (KEFLEX) 500 MG capsule Take 1 capsule (500 mg total) by mouth 3 (three) times daily. Qty: 6 capsule, Refills: 0    Magnesium Oxide 400 (240 MG) MG TABS Take 400 mg by mouth 2 (two) times daily. Qty: 60 tablet, Refills: 0    Nutritional Supplements (FEEDING SUPPLEMENT, JEVITY 1.5 CAL/FIBER,) LIQD Place 240 mLs into feeding tube 4 (four) times daily. Qty: 28800 mL, Refills: 1    omeprazole (PRILOSEC) 20 MG capsule Take 1 capsule (20 mg total) by mouth daily. Qty: 30 capsule, Refills: 0    potassium chloride (K-DUR) 10 MEQ tablet Take 1 tablet (10 mEq total) by mouth 2 (two) times daily. Qty: 60 tablet, Refills: 0      CONTINUE these medications which have CHANGED   Details  LORazepam (ATIVAN) 1 MG tablet Take 1 tablet (1 mg total) by mouth every  6 (six) hours as needed for anxiety. Qty: 32 tablet, Refills: 0      CONTINUE these medications which have NOT CHANGED   Details  Multiple Vitamins-Minerals (HCA SUPER THERAVITE-M PO) Take 1 tablet by mouth daily.    thiamine 100 MG tablet Take 1 tablet (100 mg total) by mouth daily. Qty: 7 tablet, Refills: 0    folic acid (FOLVITE) 1 MG tablet Take 1 tablet (1 mg total) by mouth daily. Qty:  30 tablet, Refills: 0      STOP taking these medications     Potassium 99 MG TABS      gabapentin (NEURONTIN) 300 MG capsule      HYDROcodone-acetaminophen (NORCO) 5-325 MG per tablet      lipase/protease/amylase (CREON) 36000 UNITS CPEP capsule      mirtazapine (REMERON) 30 MG tablet          DISCHARGE INSTRUCTIONS:   Follow-up with Dr. Rayann Heman 2 weeks Follow-up with [facility] dietitian 2 weeks. Follow-up PMD one week.  If you experience worsening of your admission symptoms, develop shortness of breath, life threatening emergency, suicidal or homicidal thoughts you must seek medical attention immediately by calling 911 or calling your MD immediately  if symptoms less severe.  You Must read complete instructions/literature along with all the possible adverse reactions/side effects for all the Medicines you take and that have been prescribed to you. Take any new Medicines after you have completely understood and accept all the possible adverse reactions/side effects.   Please note  You were cared for by a hospitalist during your hospital stay. If you have any questions about your discharge medications or the care you received while you were in the hospital after you are discharged, you can call the unit and asked to speak with the hospitalist on call if the hospitalist that took care of you is not available. Once you are discharged, your primary care physician will handle any further medical issues. Please note that NO REFILLS for any discharge medications will be authorized once you are discharged, as it is imperative that you return to your primary care physician (or establish a relationship with a primary care physician if you do not have one) for your aftercare needs so that they can reassess your need for medications and monitor your lab values.    Today   CHIEF COMPLAINT:   Chief Complaint  Patient presents with  . Weakness    general weakness, trouble taking po x 5  days    HISTORY OF PRESENT ILLNESS:  Kristine Macias  is a 29 y.o. female with a known history of alcohol abuse and anxiety presents with nausea vomiting or abdominal pain.   VITAL SIGNS:  Blood pressure 103/71, pulse 67, temperature 98.2 F (36.8 C), temperature source Oral, resp. rate 18, height 5' 6"  (1.676 m), weight 39.055 kg (86 lb 1.6 oz), last menstrual period 04/16/2015, SpO2 97 %.    PHYSICAL EXAMINATION:  GENERAL:  29 y.o.-year-old patient lying in the bed with no acute distress.  EYES: Pupils equal, round, reactive to light and accommodation. No scleral icterus. Extraocular muscles intact.  HEENT: Head atraumatic, normocephalic. Oropharynx and nasopharynx clear.  NECK:  Supple, no jugular venous distention. No thyroid enlargement, no tenderness.  LUNGS: Normal breath sounds bilaterally, no wheezing, rales,rhonchi or crepitation. No use of accessory muscles of respiration.  CARDIOVASCULAR: S1, S2 normal. No murmurs, rubs, or gallops.  ABDOMEN: Soft, non-tender, non-distended. Bowel sounds present. No organomegaly or mass.  EXTREMITIES: No  pedal edema, cyanosis, or clubbing.  NEUROLOGIC: Cranial nerves II through XII are intact. Muscle strength 5/5 in all extremities. Sensation intact. Gait not checked.  PSYCHIATRIC: The patient is alert and oriented x 3.  SKIN: No obvious rash, lesion, or ulcer.   DATA REVIEW:   CBC  Recent Labs Lab 04/17/15 0525  WBC 15.0*  HGB 12.7  HCT 37.6  PLT 122*    Chemistries   Recent Labs Lab 04/18/15 0401  NA 140  K 3.3*  CL 108  CO2 26  GLUCOSE 85  BUN <5*  CREATININE 0.45  CALCIUM 7.6*  MG 1.6*  AST 172*  ALT 94*  ALKPHOS 112  BILITOT 1.3*      RADIOLOGY:  US Abdomen Limited Ruq  04/16/2015   CLINICAL DATA:  Abdomen pain for 2 days  EXAM: US ABDOMEN LIMITED - RIGHT UPPER QUADRANT  COMPARISON:  December 20, 2014  FINDINGS: Gallbladder:  No gallstones or wall thickening visualized. The gallbladder is distended. No  sonographic Murphy sign noted.  Common bile duct:  Diameter: 2.6 mm  Liver:  No focal lesion identified. Within normal limits in parenchymal echogenicity.  IMPRESSION: Distended gallbladder without gallstones or pericholecystic fluid. No sonographic evidence of acute cholecystitis.   Electronically Signed   By: Abelardo Diesel M.D.   On: 04/16/2015 17:50    Management plans discussed with the patient, family and they are in agreement.  CODE STATUS:     Code Status Orders        Start     Ordered   04/16/15 2122  Full code   Continuous     04/16/15 2121      TOTAL TIME TAKING CARE OF THIS PATIENT:  35 minutes.    Loletha Grayer M.D on 04/18/2015 at 10:01 AM  Between 7am to 6pm - Pager - [phone]   After 6pm go to [email or web address] - password [email or web address] [facility]  [facility]  Office  [phone]  CC: Primary care physician; Leonides Sake, MD

## 2015-04-18 NOTE — Progress Notes (Signed)
Pt discharged home, instructed she should receive a call from OP nutritionist, pt has her DC instructions and scrips, tele monitor turned in, left hospital with her mother and children.  No bleeding from IV site.

## 2015-04-18 NOTE — Care Management (Signed)
Patient is currently being followed by [facility].  Admitted as inpatient for abdominal pain.  History of extensive etoh abuse.  She has a gtube for enteral feedings for malnutrition.  [facility] nurse liaison reports that patient is non compliant with her enteral feedings and patient was scheduled to be discharged from service.  Patient is scoring 0 on the CIWA scale.

## 2015-04-19 LAB — ANTI-SMOOTH MUSCLE ANTIBODY, IGG: F-Actin IgG: 7 Units (ref 0–19)

## 2015-04-19 LAB — HEPATITIS PANEL, ACUTE
HCV Ab: <0.1 s/co ratio (ref 0.0–0.9)
Hep A IgM: NEGATIVE
Hep B C IgM: NEGATIVE
Hepatitis B Surface Ag: NEGATIVE

## 2015-04-19 LAB — ANA W/REFLEX IF POSITIVE: Anti Nuclear Antibody(ANA): NEGATIVE

## 2015-04-19 LAB — MITOCHONDRIAL ANTIBODIES: Mitochondrial M2 Ab, IgG: 8 Units (ref 0.0–20.0)

## 2015-04-28 ENCOUNTER — Ambulatory Visit: Payer: Self-pay | Admitting: Dietician

## 2015-04-29 ENCOUNTER — Encounter: Payer: Medicaid Other | Attending: Internal Medicine | Admitting: Dietician

## 2015-04-29 ENCOUNTER — Encounter: Payer: Self-pay | Admitting: Dietician

## 2015-04-29 VITALS — Ht 66.0 in | Wt 84.8 lb

## 2015-04-29 DIAGNOSIS — R627 Adult failure to thrive: Secondary | ICD-10-CM | POA: Diagnosis not present

## 2015-04-29 DIAGNOSIS — R636 Underweight: Secondary | ICD-10-CM

## 2015-04-29 DIAGNOSIS — K5090 Crohn's disease, unspecified, without complications: Secondary | ICD-10-CM | POA: Diagnosis present

## 2015-04-29 NOTE — Patient Instructions (Signed)
   Eat something every 3-4 hours during the day.   Eat protein and starch first, then some vegetable -- so you don't get too full on low-cal foods.  Aim for 1000 calories or more daily in addition to the Jevity, for a total of 2400-2500 calories daily.

## 2015-04-29 NOTE — Progress Notes (Signed)
Medical Nutrition Therapy: Visit start time: 0383  end time: 1130  Assessment:  Diagnosis: failure to thrive, crohn's disease Past medical history: alcohol abuse Psychosocial issues/ stress concerns: patient reports high level of stress, taking anti-anxiety medication Preferred learning method:  . Auditory  Current weight: 84.8lbs  Height: 5'6" Medications, supplements: reviewed list in chart with patient.     She is also taking a medication for crohn's disease but could not recall the name. Progress and evaluation: Patient is struggling to gain weight after flare-up of Crohn's.         She is currently taking in 24-32oz of Jevity daily through her feeding tube, amounting to 1420kcals.         She reports fluctuating appetite, occasionally hungry, but goes for hours or days at a time with little to no hunger or appetite.          She is setting alarms to remind herself to eat. Sometimes eating schedule disrupted due to appointments.   Physical activity: some walking to stimulate appetite.  Dietary Intake:  Usual eating pattern includes 3 meals and 1 snacks per day. Dining out frequency: 3 meals per week.  Breakfast: bagel with cream cheese. occasionally omelet. water Snack: none Lunch: 1:15 1/2 sandwich, chips feels sick if eating more. Sometimes breakfast food Snack: none Supper: 5pm salisbury steak, potatoes, corn; or chicken, wild rice, veg. Plans for meat, starch, veg. Kids eat fruit, patient is avoiding to limit intake of peels, seeds. Snack: 10pm hummus and pita chips or olives (snack pack), or sometimes noodles with cheese, or cheese and saltines Beverages: water.   Nutrition Care Education: Topics covered: weight gain, Crohn's disease/ IBD Basic nutrition: basic food groups, appropriate nutrient balance, appropriate meal and snack schedule Weight gain: calculated energy needs for weight gain at 2000-2400kcal daily. Discussed calorie-dense and tolerable food choices. Importance  of protein for healing, muscle development.  Crohn's Disease: generally low fat diet as tolerated. Limited insoluble fiber. Avoidance of gas-forming foods.   Nutritional Diagnosis:  [place]-3.1 Underweight As related to crohn's disease, poor appetite.  As evidenced by patietn report, low BMI.  Intervention: Instruction as noted above.    Encouraged patient to eat at regular intervals and include protein sources several times a day.   Patient is planning to keep a food diary and will evaluate at follow-up visit.  Education Materials given:  . Food lists/ Planning A Balanced Meal . Sample meal pattern/ menus: 1200kcal meal plan to supply needs along with Jevity 1400kcal . Snacking handout . Goals/ instructions . IBD diet (FX832)  Learner/ who was taught:  . Patient    Level of understanding: Marland Kitchen Verbalizes/ demonstrates competency  Demonstrated degree of understanding via:   Teach back Learning barriers: . None  Willingness to learn/ readiness for change: . Eager, change in progress   Monitoring and Evaluation:  Dietary intake, and body weight      follow up: 05/20/15

## 2015-05-20 ENCOUNTER — Ambulatory Visit: Payer: Self-pay | Admitting: Dietician

## 2015-05-20 ENCOUNTER — Encounter: Payer: Self-pay | Admitting: Emergency Medicine

## 2015-05-20 ENCOUNTER — Emergency Department: Payer: Medicaid Other

## 2015-05-20 ENCOUNTER — Inpatient Hospital Stay
Admission: EM | Admit: 2015-05-20 | Discharge: 2015-05-23 | DRG: 640 | Disposition: A | Discharge status: Home / Self Care | Payer: Medicaid Other | Attending: Internal Medicine | Admitting: Internal Medicine

## 2015-05-20 DIAGNOSIS — Z809 Family history of malignant neoplasm, unspecified: Secondary | ICD-10-CM

## 2015-05-20 DIAGNOSIS — F1020 Alcohol dependence, uncomplicated: Secondary | ICD-10-CM | POA: Diagnosis present

## 2015-05-20 DIAGNOSIS — F410 Panic disorder [episodic paroxysmal anxiety] without agoraphobia: Secondary | ICD-10-CM | POA: Diagnosis present

## 2015-05-20 DIAGNOSIS — E43 Unspecified severe protein-calorie malnutrition: Secondary | ICD-10-CM | POA: Diagnosis present

## 2015-05-20 DIAGNOSIS — Z800 Family history of malignant neoplasm of digestive organs: Secondary | ICD-10-CM

## 2015-05-20 DIAGNOSIS — R17 Unspecified jaundice: Secondary | ICD-10-CM

## 2015-05-20 DIAGNOSIS — R7989 Other specified abnormal findings of blood chemistry: Secondary | ICD-10-CM | POA: Diagnosis present

## 2015-05-20 DIAGNOSIS — Z888 Allergy status to other drugs, medicaments and biological substances status: Secondary | ICD-10-CM

## 2015-05-20 DIAGNOSIS — F329 Major depressive disorder, single episode, unspecified: Secondary | ICD-10-CM | POA: Diagnosis present

## 2015-05-20 DIAGNOSIS — R740 Nonspecific elevation of levels of transaminase and lactic acid dehydrogenase [LDH]: Secondary | ICD-10-CM | POA: Diagnosis present

## 2015-05-20 DIAGNOSIS — R079 Chest pain, unspecified: Secondary | ICD-10-CM | POA: Insufficient documentation

## 2015-05-20 DIAGNOSIS — R945 Abnormal results of liver function studies: Secondary | ICD-10-CM

## 2015-05-20 DIAGNOSIS — Z931 Gastrostomy status: Secondary | ICD-10-CM

## 2015-05-20 DIAGNOSIS — Z882 Allergy status to sulfonamides status: Secondary | ICD-10-CM

## 2015-05-20 DIAGNOSIS — R748 Abnormal levels of other serum enzymes: Secondary | ICD-10-CM | POA: Diagnosis present

## 2015-05-20 DIAGNOSIS — F411 Generalized anxiety disorder: Secondary | ICD-10-CM | POA: Diagnosis present

## 2015-05-20 DIAGNOSIS — Z79899 Other long term (current) drug therapy: Secondary | ICD-10-CM | POA: Diagnosis not present

## 2015-05-20 DIAGNOSIS — K5000 Crohn's disease of small intestine without complications: Secondary | ICD-10-CM | POA: Diagnosis present

## 2015-05-20 DIAGNOSIS — R627 Adult failure to thrive: Secondary | ICD-10-CM | POA: Diagnosis present

## 2015-05-20 DIAGNOSIS — R000 Tachycardia, unspecified: Secondary | ICD-10-CM | POA: Diagnosis present

## 2015-05-20 DIAGNOSIS — Z9889 Other specified postprocedural states: Secondary | ICD-10-CM

## 2015-05-20 DIAGNOSIS — Z91040 Latex allergy status: Secondary | ICD-10-CM | POA: Diagnosis not present

## 2015-05-20 DIAGNOSIS — Z823 Family history of stroke: Secondary | ICD-10-CM | POA: Diagnosis not present

## 2015-05-20 DIAGNOSIS — R778 Other specified abnormalities of plasma proteins: Secondary | ICD-10-CM

## 2015-05-20 DIAGNOSIS — F17210 Nicotine dependence, cigarettes, uncomplicated: Secondary | ICD-10-CM | POA: Diagnosis present

## 2015-05-20 DIAGNOSIS — R109 Unspecified abdominal pain: Secondary | ICD-10-CM

## 2015-05-20 LAB — BASIC METABOLIC PANEL
Anion gap: 10 (ref 5–15)
BUN: 8 mg/dL (ref 6–20)
CHLORIDE: 96 mmol/L — AB (ref 101–111)
CO2: 28 mmol/L (ref 22–32)
CREATININE: 0.42 mg/dL — AB (ref 0.44–1.00)
Calcium: 8 mg/dL — ABNORMAL LOW (ref 8.9–10.3)
GFR calc non Af Amer: >60 mL/min (ref >60)
GLUCOSE: 114 mg/dL — AB (ref 65–99)
Potassium: 3.5 mmol/L (ref 3.5–5.1)
Sodium: 134 mmol/L — ABNORMAL LOW (ref 135–145)

## 2015-05-20 LAB — URINALYSIS COMPLETE WITH MICROSCOPIC (ARMC ONLY)
Bacteria, UA: NONE SEEN
Glucose, UA: NEGATIVE mg/dL
Hgb urine dipstick: NEGATIVE
Leukocytes, UA: NEGATIVE
Nitrite: NEGATIVE
PH: 6 (ref 5.0–8.0)
PROTEIN: NEGATIVE mg/dL
Specific Gravity, Urine: 1.021 (ref 1.005–1.030)

## 2015-05-20 LAB — LIPASE, BLOOD: Lipase: 16 U/L — ABNORMAL LOW (ref 22–51)

## 2015-05-20 LAB — HEPATIC FUNCTION PANEL
ALT: 71 U/L — AB (ref 14–54)
AST: 196 U/L — AB (ref 15–41)
Albumin: 2.3 g/dL — ABNORMAL LOW (ref 3.5–5.0)
Alkaline Phosphatase: 566 U/L — ABNORMAL HIGH (ref 38–126)
BILIRUBIN DIRECT: 4.2 mg/dL — AB (ref 0.1–0.5)
BILIRUBIN TOTAL: 6.5 mg/dL — AB (ref 0.3–1.2)
Indirect Bilirubin: 2.3 mg/dL — ABNORMAL HIGH (ref 0.3–0.9)
Total Protein: 6.1 g/dL — ABNORMAL LOW (ref 6.5–8.1)

## 2015-05-20 LAB — CBC
HCT: 41.9 % (ref 35.0–47.0)
Hemoglobin: 14.3 g/dL (ref 12.0–16.0)
MCH: 31.9 pg (ref 26.0–34.0)
MCHC: 34.1 g/dL (ref 32.0–36.0)
MCV: 93.4 fL (ref 80.0–100.0)
PLATELETS: 95 10*3/uL — AB (ref 150–440)
RBC: 4.48 MIL/uL (ref 3.80–5.20)
RDW: 17 % — ABNORMAL HIGH (ref 11.5–14.5)
WBC: 8.9 10*3/uL (ref 3.6–11.0)

## 2015-05-20 LAB — TROPONIN I
TROPONIN I: 0.03 ng/mL (ref <0.031)
Troponin I: 0.05 ng/mL — ABNORMAL HIGH (ref <0.031)

## 2015-05-20 LAB — FIBRIN DERIVATIVES D-DIMER (ARMC ONLY): Fibrin derivatives D-dimer (ARMC): 1407 — ABNORMAL HIGH (ref 0–499)

## 2015-05-20 MED ORDER — VITAMIN B-1 100 MG PO TABS
100.0000 mg | ORAL_TABLET | Freq: Every day | ORAL | Status: DC
  Administered 2015-05-20 – 2015-05-23 (×4): 100 mg via ORAL
  Filled 2015-05-20 (×4): qty 1

## 2015-05-20 MED ORDER — ENOXAPARIN SODIUM 40 MG/0.4ML ~~LOC~~ SOLN
40.0000 mg | SUBCUTANEOUS | Status: DC
  Filled 2015-05-20: qty 0.4

## 2015-05-20 MED ORDER — ASPIRIN 81 MG PO CHEW
81.0000 mg | CHEWABLE_TABLET | Freq: Every day | ORAL | Status: DC
  Administered 2015-05-22: 81 mg via ORAL
  Filled 2015-05-20 (×4): qty 1

## 2015-05-20 MED ORDER — ONDANSETRON HCL 4 MG/2ML IJ SOLN
4.0000 mg | Freq: Once | INTRAMUSCULAR | Status: AC
  Administered 2015-05-20: 4 mg via INTRAVENOUS

## 2015-05-20 MED ORDER — JEVITY 1.5 CAL/FIBER PO LIQD
240.0000 mL | Freq: Four times a day (QID) | ORAL | Status: DC
  Administered 2015-05-20: 240 mL
  Administered 2015-05-21: 120 mL

## 2015-05-20 MED ORDER — ADULT MULTIVITAMIN W/MINERALS CH
1.0000 | ORAL_TABLET | Freq: Every day | ORAL | Status: DC
  Administered 2015-05-20 – 2015-05-23 (×4): 1 via ORAL
  Filled 2015-05-20 (×4): qty 1

## 2015-05-20 MED ORDER — POTASSIUM CHLORIDE ER 10 MEQ PO TBCR
10.0000 meq | EXTENDED_RELEASE_TABLET | Freq: Two times a day (BID) | ORAL | Status: DC
  Administered 2015-05-20 – 2015-05-23 (×6): 10 meq via ORAL
  Filled 2015-05-20 (×13): qty 1

## 2015-05-20 MED ORDER — ONDANSETRON HCL 4 MG/2ML IJ SOLN
INTRAMUSCULAR | Status: AC
  Administered 2015-05-20: 4 mg via INTRAVENOUS
  Filled 2015-05-20: qty 2

## 2015-05-20 MED ORDER — ENOXAPARIN SODIUM 30 MG/0.3ML ~~LOC~~ SOLN
30.0000 mg | SUBCUTANEOUS | Status: DC
  Administered 2015-05-21 – 2015-05-22 (×2): 30 mg via SUBCUTANEOUS
  Filled 2015-05-20 (×3): qty 0.3

## 2015-05-20 MED ORDER — LORAZEPAM 1 MG PO TABS
1.0000 mg | ORAL_TABLET | Freq: Four times a day (QID) | ORAL | Status: DC | PRN
  Administered 2015-05-20 – 2015-05-23 (×9): 1 mg via ORAL
  Filled 2015-05-20 (×9): qty 1

## 2015-05-20 MED ORDER — IOHEXOL 350 MG/ML SOLN
75.0000 mL | Freq: Once | INTRAVENOUS | Status: AC | PRN
  Administered 2015-05-20: 75 mL via INTRAVENOUS

## 2015-05-20 MED ORDER — MAGNESIUM OXIDE 400 (241.3 MG) MG PO TABS
400.0000 mg | ORAL_TABLET | Freq: Two times a day (BID) | ORAL | Status: DC
  Administered 2015-05-20 – 2015-05-23 (×6): 400 mg via ORAL
  Filled 2015-05-20 (×13): qty 1

## 2015-05-20 MED ORDER — FOLIC ACID 1 MG PO TABS
1.0000 mg | ORAL_TABLET | Freq: Every day | ORAL | Status: DC
  Administered 2015-05-21 – 2015-05-23 (×3): 1 mg via ORAL
  Filled 2015-05-20 (×4): qty 1

## 2015-05-20 NOTE — ED Notes (Signed)
Pt states that she has been diagnosed with colitis and a type of hepatitis, she states that she has lost too much weight over the past 2 mos, causing her to be very weak, she has a feeding tube and cont to vomit the feedings, pt states that she is hurting all over

## 2015-05-20 NOTE — ED Notes (Signed)
MD at bedside. 

## 2015-05-20 NOTE — ED Notes (Addendum)
PA student at bedside.

## 2015-05-20 NOTE — H&P (Signed)
[facility] [place] NAME: Kristine Macias    MR#:  [id number]  DATE OF BIRTH:  04-14-1986  DATE OF ADMISSION:  05/20/2015  PRIMARY CARE PHYSICIAN: Leonides Sake, MD   REQUESTING/REFERRING PHYSICIAN: Dr. Archie Balboa  CHIEF COMPLAINT:  I'm having chest pain constant for 2 days, unable to eat, feeling sad.  HISTORY OF PRESENT ILLNESS:  Kristine Macias  is a 29 y.o. female with a known history of severe protein calorie malnutrition has PEG tube feeding, Crohn's ileitis, severe alcohol dependence for last 2-3 years, depression/anxiety, comes to the emergency room with chest pain which patient describes as diffuse pain all over the chest for last 2 days which is constant and no aggravating or relieving factor. Denies any radiation of pain any diaphoresis or shortness of breath. Denies any cardiac history. Patient continues to drink alcohol on a daily basis about 2-3 large cans of beer. She reports cutting down her PEG tube feeding because she is vomiting. Upon asking if she tolerates her alcohol and she said yes however say she cannot tolerate her Jevity feeding. Patient has been asking for detox. She is not tachycardic,not hypertensive, does not have any tremors she is requesting IV Ativan. He was noted on her labs her bilirubin has gone up to 6.0. She denies any right upper quadrant pain. She recently had an ultrasound of the abdomen on August 20 which did not show any gallstones. Showed diffuse hepatic steatosis. Patient is being admitted for further evaluation and management.  PAST MEDICAL HISTORY:   Past Medical History  Diagnosis Date  . Anxiety   . Chlamydia 2007  . Irregular heart beat 2010    wt/diet related after evaluation  . Renal disorder   . Pneumothorax, spontaneous, tension   . Alcohol abuse   . Pancreas divisum   . Failure to thrive in adult     PAST SURGICAL HISTOIRY:   Past Surgical History  Procedure Laterality  Date  . Cesarean section    . Chest tube insertion    . Esophagogastroduodenoscopy N/A 01/06/2015    Procedure: ESOPHAGOGASTRODUODENOSCOPY (EGD);  Surgeon: Josefine Class, MD;  Location: [facility] ENDOSCOPY;  Service: Endoscopy;  Laterality: N/A;  . Colonoscopy with propofol N/A 01/06/2015    Procedure: COLONOSCOPY WITH PROPOFOL;  Surgeon: Josefine Class, MD;  Location[facility]COPY;  Service: Endoscopy;  Laterality: N/A;  . Peg placement N/A 01/11/2015    Procedure: PERCUTANEOUS ENDOSCOPIC GASTROSTOMY (PEG) PLACEMENT;  Surgeon: Josefine Class, MD;  Location: [facility] ENDOSCOPY;  Service: Endoscopy;  Laterality: N/A;  . Esophagogastroduodenoscopy N/A 01/11/2015    Rein-normal with PEG placement    SOCIAL HISTORY:   Social History  Substance Use Topics  . Smoking status: Current Every Day Smoker -- 2.00 packs/day    Types: Cigarettes  . Smokeless tobacco: Never Used  . Alcohol Use: 21.6 oz/week    36 Cans of beer per week     Comment: 6 beers/day; trying to quit, participating in [place]:   Family History  Problem Relation Age of Onset  . Anesthesia problems Neg Hx   . Thyroid disease Mother   . Cancer Father   . Stroke Other   . Crohn's disease Brother     DRUG ALLERGIES:   Allergies  Allergen Reactions  . Effexor [Venlafaxine] Anaphylaxis  . Sulfa Antibiotics Anaphylaxis  . Tetracyclines & Related Anaphylaxis  . Trazodone And Nefazodone Anaphylaxis  . Latex Hives, Itching and  Rash  . Paxil [Paroxetine] Hives, Itching and Rash  . Seroquel [Quetiapine Fumarate] Hives, Itching and Rash  . Zoloft [Sertraline Hcl] Hives, Itching and Rash    REVIEW OF SYSTEMS:  Review of Systems  Constitutional: Positive for weight loss and malaise/fatigue. Negative for fever and chills.  HENT: Negative for ear discharge, ear pain and nosebleeds.   Eyes: Negative for blurred vision, pain and discharge.  Respiratory: Negative for sputum production, shortness of  breath, wheezing and stridor.   Cardiovascular: Positive for chest pain and palpitations. Negative for orthopnea and PND.  Gastrointestinal: Negative for nausea, vomiting, abdominal pain and diarrhea.  Genitourinary: Negative for urgency and frequency.  Musculoskeletal: Negative for back pain and joint pain.  Neurological: Positive for weakness. Negative for sensory change, speech change and focal weakness.  Psychiatric/Behavioral: Positive for depression and substance abuse. Negative for hallucinations. The patient is not nervous/anxious.   All other systems reviewed and are negative.    MEDICATIONS AT HOME:   Prior to Admission medications   Medication Sig Start Date End Date Taking? Authorizing Provider  folic acid (FOLVITE) 1 MG tablet Take 1 tablet (1 mg total) by mouth daily. 03/03/15  Yes Demetrios Loll, MD  LORazepam (ATIVAN) 1 MG tablet Take 1 tablet (1 mg total) by mouth every 6 (six) hours as needed for anxiety. 04/18/15  Yes Loletha Grayer, MD  Magnesium Oxide 400 (240 MG) MG TABS Take 400 mg by mouth 2 (two) times daily. 04/18/15  Yes Loletha Grayer, MD  Multiple Vitamin (MULTIVITAMIN WITH MINERALS) TABS tablet Take 1 tablet by mouth daily.   Yes Historical Provider, MD  Nutritional Supplements (FEEDING SUPPLEMENT, JEVITY 1.5 CAL/FIBER,) LIQD Place 240 mLs into feeding tube 4 (four) times daily. 04/18/15  Yes Loletha Grayer, MD  potassium chloride (K-DUR) 10 MEQ tablet Take 1 tablet (10 mEq total) by mouth 2 (two) times daily. 04/18/15  Yes Loletha Grayer, MD  thiamine 100 MG tablet Take 1 tablet (100 mg total) by mouth daily. 03/03/15  Yes Demetrios Loll, MD  cephALEXin (KEFLEX) 500 MG capsule Take 1 capsule (500 mg total) by mouth 3 (three) times daily. Patient not taking: Reported on 04/29/2015 04/18/15   Loletha Grayer, MD  omeprazole (PRILOSEC) 20 MG capsule Take 1 capsule (20 mg total) by mouth daily. Patient not taking: Reported on 05/20/2015 04/18/15   Loletha Grayer, MD      VITAL  SIGNS:  Blood pressure 115/84, pulse 96, temperature 98.1 F (36.7 C), temperature source Oral, resp. rate 16, height 5' 6"  (1.676 m), weight 34.473 kg (76 lb), SpO2 97 %.  PHYSICAL EXAMINATION:  GENERAL:  29 y.o.-year-old patient lying in the bed with no acute distress. Thin,cachectic EYES: Pupils equal, round, reactive to light and accommodation. No scleral icterus. Extraocular muscles intact.  HEENT: Head atraumatic, normocephalic. Oropharynx and nasopharynx clear.  NECK:  Supple, no jugular venous distention. No thyroid enlargement, no tenderness.  LUNGS: Normal breath sounds bilaterally, no wheezing, rales,rhonchi or crepitation. No use of accessory muscles of respiration.  CARDIOVASCULAR: S1, S2 normal. No murmurs, rubs, or gallops.  ABDOMEN: Soft, nontender, nondistended. Bowel sounds present. No organomegaly or mass.  EXTREMITIES: No pedal edema, cyanosis, or clubbing.  NEUROLOGIC: Cranial nerves II through XII are intact. Muscle strength 5/5 in all extremities. Sensation intact. Gait not checked.  PSYCHIATRIC: The patient is alert and oriented x 3.  SKIN: No obvious rash, lesion, or ulcer.   LABORATORY PANEL:   CBC  Recent Labs Lab 05/20/15 1049  WBC 8.9  HGB 14.3  HCT 41.9  PLT 95*   ------------------------------------------------------------------------------------------------------------------  Chemistries   Recent Labs Lab 05/20/15 1049  NA 134*  K 3.5  CL 96*  CO2 28  GLUCOSE 114*  BUN 8  CREATININE 0.42*  CALCIUM 8.0*  AST 196*  ALT 71*  ALKPHOS 566*  BILITOT 6.5*   ------------------------------------------------------------------------------------------------------------------  Cardiac Enzymes  Recent Labs Lab 05/20/15 1049  TROPONINI 0.05*   ------------------------------------------------------------------------------------------------------------------  RADIOLOGY:  Dg Chest 2 View  05/20/2015   CLINICAL DATA:  Pt states that she has  been diagnosed with colitis and a type of hepatitis, pt has a feeding tube, continues to vomit, weakness, productive cough, smoker.  EXAM: CHEST  2 VIEW  COMPARISON:  03/01/2015  FINDINGS: Lungs are hyperinflated. Heart size is normal. No focal consolidations or pleural effusions. No pulmonary edema.  IMPRESSION: No active cardiopulmonary disease.   Electronically Signed   By: Nolon Nations M.D.   On: 05/20/2015 13:10   Ct Angio Chest Pe W/cm &/or Wo Cm  05/20/2015   CLINICAL DATA:  29 year old with cough and shortness of breath. Anterior chest pain for 48 hours. History of spontaneous pneumothoraces.  EXAM: CT ANGIOGRAPHY CHEST WITH CONTRAST  TECHNIQUE: Multidetector CT imaging of the chest was performed using the standard protocol during bolus administration of intravenous contrast. Multiplanar CT image reconstructions and MIPs were obtained to evaluate the vascular anatomy.  CONTRAST:  70m OMNIPAQUE IOHEXOL 350 MG/ML SOLN  COMPARISON:  Chest radiograph 05/20/2015  FINDINGS: Negative for pulmonary embolism. Normal appearance of the thoracic aorta and the great vessels are patent. Diffuse low-attenuation of the liver suggests diffuse hepatic steatosis. There is no significant pericardial or pleural fluid. No evidence for chest lymphadenopathy.  The trachea and mainstem bronchi are patent. Negative for pneumothorax. Minimal scarring at the lung apices. Few subtle densities in the lingula, best seen on sequence 8, image 108. No large areas of consolidation.  No acute bone abnormality.  Review of the MIP images confirms the above findings.  IMPRESSION: Negative for pulmonary embolism.  Subtle ground-glass densities in the lingula. Findings could represent a small focus of pneumonia.  Diffuse low density of the liver is suggestive for hepatic steatosis.   Electronically Signed   By: AMarkus DaftM.D.   On: 05/20/2015 16:49    EKG:    S tachycardia IMPRESSION AND PLAN:  29year [place]  medical history of severe profound malnutrition status post PEG placement and PEG feeding, severe alcohol dependence, depression comes to the emergency room with chest pain.  1. Chest pain constant for 2 days Mild elevated troponin Appears atypical chest pain. No cardiac history. EKG shows sinus tachycardia and no acute ST elevation or depression. We'll cycle cardiac enzymes. Consider cardiac consultation if needed.  2. Severe alcohol dependence and abuse. Advised alcohol cessation. Patient not motivated. She is requesting her detox. She is wanting IV Ativan. According to old records there is been a pattern noted where patient when runs out of Ativan comes to the emergency room. [facility]  3. Elevated bilirubin, LDH and chronic transaminitis Patient recently had an ultrasound of the abdomen which showed diffuse hepatic steatosis no gallstones. This ultrasound was done on 04/16/2015. Bilirubin today is 6.0. Last bilirubin was 1.5 about a month ago GI consultation for further evaluation. pt follows up with Dr.Rein as outpatient  4. Depression Continue her home meds. She denies suicidal ideation. We'll consider psych  evaluation  5. DVT prophylaxis subcutaneous Lovenox  All the records are reviewed and case discussed with ED provider. Management plans discussed with the patient, family and they are in agreement.  CODE STATUS: Full  TOTAL TIME TAKING CARE OF THIS PATIENT: 50 minutes.    PATEL,SONA M.D on 05/20/2015 at 5:46 PM  Between 7am to 6pm - Pager - [phone]  After 6pm go to [email or web address] - password [email or web address] [facility]  [facility]  Office  [phone]  CC: Primary care physician; Leonides Sake, MD

## 2015-05-20 NOTE — ED Provider Notes (Signed)
[facility] Emergency Department [facility] Note    ____________________________________________  Time seen: 1230  I have reviewed the triage vital signs and the nursing notes.   HISTORY  Chief Complaint Weakness   History limited by: Not Limited   HPI Kristine Macias is a 29 y.o. female who presented to the emergency department today with multiple medical complaints. The patient does have a history of chronic alcohol abuse, malnutrition status post feeding tube insertion. The patient states that she has been having stomach pain. This been going on for weeks. It is located primarily in the right side. Additionally today the patient is complaining of some chest pain. She states this been going on for the past couple days. Located in the central part of her chest. She describes it as dull. It does radiate to her neck and shoulders sometimes. She has had shortness of breath. No fever.      Past Medical History  Diagnosis Date  . Anxiety   . Chlamydia 2007  . Irregular heart beat 2010    wt/diet related after evaluation  . Renal disorder   . Pneumothorax, spontaneous, tension   . Alcohol abuse   . Pancreas divisum   . Failure to thrive in adult     Patient Active Problem List   Diagnosis Date Noted  . GAD (generalized anxiety disorder) 04/17/2015  . Panic disorder 04/17/2015  . Alcohol withdrawal 04/16/2015  . Abdominal pain, generalized 04/16/2015  . Vomiting and diarrhea   . Failure to thrive in adult   . Intractable nausea and vomiting 03/01/2015  . Hypokalemia 03/01/2015  . Protein-calorie malnutrition, severe 01/05/2015  . Alcohol abuse   . Generalized anxiety disorder 11/26/2014    Class: Chronic  . Alcohol use disorder, severe, dependence 11/25/2014  . Adnexal mass 04/10/2012    Past Surgical History  Procedure Laterality Date  . Cesarean section    . Chest tube insertion    . Esophagogastroduodenoscopy N/A 01/06/2015     Procedure: ESOPHAGOGASTRODUODENOSCOPY (EGD);  Surgeon: Josefine Class, MD;  Location: [facility] ENDOSCOPY;  Service: Endoscopy;  Laterality: N/A;  . Colonoscopy with propofol N/A 01/06/2015    Procedure: COLONOSCOPY WITH PROPOFOL;  Surgeon: Josefine Class, MD;  Location: [facility] ENDOSCOPY;  Service: Endoscopy;  Laterality: N/A;  . Peg placement N/A 01/11/2015    Procedure: PERCUTANEOUS ENDOSCOPIC GASTROSTOMY (PEG) PLACEMENT;  Surgeon: Josefine Class, MD;  [facility] END[facility]COPY;  Service: Endoscopy;  Laterality: N/A;  . Esophagogastroduodenoscopy N/A 01/11/2015    Rein-normal with PEG placement    Current Outpatient Rx  Name  Route  Sig  Dispense  Refill  . cephALEXin (KEFLEX) 500 MG capsule   Oral   Take 1 capsule (500 mg total) by mouth 3 (three) times daily. Patient not taking: Reported on 04/29/2015   6 capsule   0   . folic acid (FOLVITE) 1 MG tablet   Oral   Take 1 tablet (1 mg total) by mouth daily.   30 tablet   0   . LORazepam (ATIVAN) 1 MG tablet   Oral   Take 1 tablet (1 mg total) by mouth every 6 (six) hours as needed for anxiety.   32 tablet   0   . Magnesium Oxide 400 (240 MG) MG TABS   Oral   Take 400 mg by mouth 2 (two) times daily.   60 tablet   0   . Multiple Vitamins-Minerals (HCA SUPER THERAVITE-M PO)   Oral   Take  1 tablet by mouth daily.         . Nutritional Supplements (FEEDING SUPPLEMENT, JEVITY 1.5 CAL/FIBER,) LIQD   Per Tube   Place 240 mLs into feeding tube 4 (four) times daily.   28800 mL   1   . omeprazole (PRILOSEC) 20 MG capsule   Oral   Take 1 capsule (20 mg total) by mouth daily.   30 capsule   0   . potassium chloride (K-DUR) 10 MEQ tablet   Oral   Take 1 tablet (10 mEq total) by mouth 2 (two) times daily.   60 tablet   0   . thiamine 100 MG tablet   Oral   Take 1 tablet (100 mg total) by mouth daily.   7 tablet   0     Allergies Effexor; Sulfa antibiotics; Tetracyclines & related; Trazodone and nefazodone;  Latex; Paxil; Seroquel; and Zoloft  Family History  Problem Relation Age of Onset  . Anesthesia problems Neg Hx   . Thyroid disease Mother   . Cancer Father   . Stroke Other   . Crohn's disease Brother     Social History Social History  Substance Use Topics  . Smoking status: Current Every Day Smoker -- 2.00 packs/day    Types: Cigarettes  . Smokeless tobacco: Never Used  . Alcohol Use: 21.6 oz/week    36 Cans of beer per week     Comment: 6 beers/day; trying to quit, participating in [place]    Review of Systems  Constitutional: Negative for fever. Cardiovascular: Positive for chest pain. Respiratory: Positive for shortness of breath. Gastrointestinal: Positive for abdominal pain Genitourinary: Negative for dysuria. Musculoskeletal: Negative for back pain. Skin: Negative for rash. Neurological: Negative for headaches, focal weakness or numbness.  10-point ROS otherwise negative.  ____________________________________________   PHYSICAL EXAM:  VITAL SIGNS: ED Triage Vitals  Enc Vitals Group     BP 05/20/15 1041 109/84 mmHg     Pulse Rate 05/20/15 1041 111     Resp 05/20/15 1041 18     Temp 05/20/15 1041 98.1 F (36.7 C)     Temp Source 05/20/15 1041 Oral     SpO2 05/20/15 1041 95 %     Weight 05/20/15 1041 76 lb (34.473 kg)     Height 05/20/15 1041 5' 6"  (1.676 m)   Constitutional: Alert and oriented. Chronically ill appearing. Cachectic.  Eyes: Conjunctivae are normal. PERRL. Normal extraocular movements. ENT   Head: Normocephalic and atraumatic.   Nose: No congestion/rhinnorhea.   Mouth/Throat: Mucous membranes are moist.   Neck: No stridor. Hematological/Lymphatic/Immunilogical: No cervical lymphadenopathy. Cardiovascular: Normal rate, regular rhythm.  No murmurs, rubs, or gallops. Respiratory: Normal respiratory effort without tachypnea nor retractions. Breath sounds are clear and equal bilaterally. No wheezes/rales/rhonchi. Gastrointestinal:  Soft and nontender. No distention.  Genitourinary: Deferred Musculoskeletal: Normal range of motion in all extremities. No joint effusions.  No lower extremity tenderness nor edema. Neurologic:  Normal speech and language. No gross focal neurologic deficits are appreciated. Speech is normal.  Skin:  Skin is warm, dry and intact. No rash noted. Psychiatric: Mood and affect are normal. Speech and behavior are normal. Patient exhibits appropriate insight and judgment.  ____________________________________________    LABS (pertinent positives/negatives)  Labs Reviewed  BASIC METABOLIC PANEL - Abnormal; Notable for the following:    Sodium 134 (*)    Chloride 96 (*)    Glucose, Bld 114 (*)    Creatinine, Ser 0.42 (*)    Calcium 8.0 (*)  All other components within normal limits  CBC - Abnormal; Notable for the following:    RDW 17.0 (*)    Platelets 95 (*)    All other components within normal limits  TROPONIN I - Abnormal; Notable for the following:    Troponin I 0.05 (*)    All other components within normal limits  HEPATIC FUNCTION PANEL - Abnormal; Notable for the following:    Total Protein 6.1 (*)    Albumin 2.3 (*)    AST 196 (*)    ALT 71 (*)    Alkaline Phosphatase 566 (*)    Total Bilirubin 6.5 (*)    Bilirubin, Direct 4.2 (*)    Indirect Bilirubin 2.3 (*)    All other components within normal limits  LIPASE, BLOOD - Abnormal; Notable for the following:    Lipase 16 (*)    All other components within normal limits  FIBRIN DERIVATIVES D-DIMER ([facility] ONLY) - Abnormal; Notable for the following:    Fibrin derivatives D-dimer ([facility]) 1407 (*)    All other components within normal limits  URINALYSIS COMPLETEWITH MICROSCOPIC ([facility] ONLY) - Abnormal; Notable for the following:    Color, Urine AMBER (*)    APPearance CLEAR (*)    Bilirubin Urine 2+ (*)    Ketones, ur TRACE (*)    Squamous Epithelial / LPF 0-5 (*)    All other components within normal limits  TROPONIN I   TROPONIN I     ____________________________________________   EKG  I, Nance Pear, attending physician, personally viewed and interpreted this EKG  EKG Time: 1101 Rate: 115 Rhythm: sinus tachycardia Axis: normal Intervals: qtc 603 QRS: narrow ST changes: no st elevation Impression: abnormal ekg   ____________________________________________    RADIOLOGY  CT abd/pel IMPRESSION: Negative for pulmonary embolism.  Subtle ground-glass densities in the lingula. Findings could represent a small focus of pneumonia.  Diffuse low density of the liver is suggestive for hepatic steatosis.  ____________________________________________   PROCEDURES  Procedure(s) performed: None  Critical Care performed: Yes, see critical care note(s)  CRITICAL CARE Performed by: Nance Pear   Total critical care time: 30  Critical care time was exclusive of separately billable procedures and treating other patients.  Critical care was necessary to treat or prevent imminent or life-threatening deterioration.  Critical care was time spent personally by me on the following activities: development of treatment plan with patient and/or surrogate as well as nursing, discussions with consultants, evaluation of patient's response to treatment, examination of patient, obtaining history from patient or surrogate, ordering and performing treatments and interventions, ordering and review of laboratory studies, ordering and review of radiographic studies, pulse oximetry and re-evaluation of patient's condition.  ____________________________________________   INITIAL IMPRESSION / ASSESSMENT AND PLAN / ED COURSE  Pertinent labs & imaging results that were available during my care of the patient were reviewed by me and considered in my medical decision making (see chart for details).  Patient presented to the emergency department because of May complaints of chest pain, shortness breath  and abdominal pain. Blood work was significant for elevated troponin. Initially patient had elevated d-dimer less than a CTA scan was ordered. This was negative for pulmonary embolism. In addition there is concern for much greater bilirubin then one month ago. She does have a history of transaminitis but the change was quite concerning to me. An ultrasound was performed roughly 1 month ago which did not show any gallstones. I have admitted the patient to the  hospitalist for further management and workup.  ____________________________________________   FINAL CLINICAL IMPRESSION(S) / ED DIAGNOSES  Final diagnoses:  Elevated bilirubin  Elevated troponin  Abdominal pain, unspecified abdominal location     Nance Pear, MD 05/20/15 2120

## 2015-05-20 NOTE — ED Notes (Signed)
Attempted to call report

## 2015-05-21 DIAGNOSIS — F1020 Alcohol dependence, uncomplicated: Secondary | ICD-10-CM

## 2015-05-21 DIAGNOSIS — F411 Generalized anxiety disorder: Secondary | ICD-10-CM

## 2015-05-21 DIAGNOSIS — R627 Adult failure to thrive: Principal | ICD-10-CM

## 2015-05-21 LAB — PROTIME-INR
INR: 1.15
Prothrombin Time: 14.9 seconds (ref 11.4–15.0)

## 2015-05-21 LAB — HEPATIC FUNCTION PANEL
ALT: 61 U/L — AB (ref 14–54)
AST: 194 U/L — AB (ref 15–41)
Albumin: 2.1 g/dL — ABNORMAL LOW (ref 3.5–5.0)
Alkaline Phosphatase: 505 U/L — ABNORMAL HIGH (ref 38–126)
BILIRUBIN DIRECT: 4.1 mg/dL — AB (ref 0.1–0.5)
BILIRUBIN INDIRECT: 2.5 mg/dL — AB (ref 0.3–0.9)
BILIRUBIN TOTAL: 6.6 mg/dL — AB (ref 0.3–1.2)
Total Protein: 5.5 g/dL — ABNORMAL LOW (ref 6.5–8.1)

## 2015-05-21 LAB — PHOSPHORUS: Phosphorus: 3.7 mg/dL (ref 2.5–4.6)

## 2015-05-21 LAB — TROPONIN I
TROPONIN I: 0.03 ng/mL (ref <0.031)
Troponin I: 0.04 ng/mL — ABNORMAL HIGH (ref <0.031)

## 2015-05-21 LAB — MAGNESIUM: MAGNESIUM: 1.7 mg/dL (ref 1.7–2.4)

## 2015-05-21 LAB — POCT PREGNANCY, URINE: Preg Test, Ur: NEGATIVE

## 2015-05-21 MED ORDER — JEVITY 1.5 CAL/FIBER PO LIQD
120.0000 mL | Freq: Four times a day (QID) | ORAL | Status: DC
  Administered 2015-05-21: 120 mL
  Administered 2015-05-21: 237 mL
  Administered 2015-05-21 – 2015-05-22 (×4): 120 mL

## 2015-05-21 MED ORDER — PANTOPRAZOLE SODIUM 40 MG PO TBEC
40.0000 mg | DELAYED_RELEASE_TABLET | Freq: Two times a day (BID) | ORAL | Status: DC
  Administered 2015-05-21 – 2015-05-23 (×4): 40 mg via ORAL
  Filled 2015-05-21 (×4): qty 1

## 2015-05-21 MED ORDER — FREE WATER
200.0000 mL | Freq: Three times a day (TID) | Status: DC
  Administered 2015-05-21 – 2015-05-23 (×7): 200 mL

## 2015-05-21 NOTE — Consult Note (Signed)
GI Inpatient Consult Note Lollie Sails MD  Reason for Consult: Elevated bilirubin   Attending Requesting Consult: Dr. Fritzi Mandes  Outpatient Primary Physician: Leonides Sake, MD   History of Present Illness: Kristine Macias is a 29 y.o. female with a known history of alcohol abuse and severe protein calorie malnutrition. He has followed with Dr. Rayann Heman since being seen by him in the hospital several months ago. She was admitted to the hospital with chest pain however denies that that is a problem currently. She states she was nauseated and having vomiting as well as some right upper quadrant pain.  She states "I started drinking again". He has a history of alcohol abuse and had stopped for about a month and 3 days ago to binge drinking. She is relatively evasive in direct questions however admits to drinking 4 40 ounce beers over the course of the day. He came to the hospital with chest discomfort currently felt by internal medicine to be atypical chest pain.  He has a history of having pancreatitis in the past low her lipase was low on this admission. Currently denies chest pain.  In regards to her protein calorie malnutrition she had a PEG tube placed on 01/11/2015 due to inability to eat. She has had some intermittent technical difficulties with the tube itself early states that the outer tube which connects to a Mickey type PEG will get clogged. Looked at this tube is clear. Uncertain as to what the issue would be with this tube. She states that after the tube was initially placed she had gained up to 93 pounds has subsequently lost again. He states that she takes most of her nutrition orally to the PEG tube.  Liver history-she had some tattoos placed about 3 years ago. She has no military experienced no incarceration and no foreign travel. She has never had a blood transfusion. She had a aunt deceased from apparently primary liver cancer, she denies any intravenous drug use or  intranasal cocaine use. She's had no jobs around toxic chemicals.  She also had an EGD on 01/06/2016 for weight loss that was normal. She was negative for Helicobacter pylori and biopsies were negative for sprue. A colonoscopy done that same day that was questionable for ileitis however apices negative for that as well. The body studies for Saccharomyces were negative. She had an upper GI series on 03/03/2015 that was normal. An ultrasound on 04/16/2015 showing a normal size common bile duct at 2.6 mm. However there may have been some sludge in the gallbladder. She has had an ASMA AMA and ANA all of which were normal. Presentation during this hospitalization she showed an elevated bilirubin rate fraction as well as elevated alkaline phosphatase, AST and ALT in excess of what has been seen in the past. Repeat hepatic function panel from this morning compared to yesterday is relatively the same.  Past Medical History:  Past Medical History  Diagnosis Date  . Anxiety   . Chlamydia 2007  . Irregular heart beat 2010    wt/diet related after evaluation  . Renal disorder   . Pneumothorax, spontaneous, tension   . Alcohol abuse   . Pancreas divisum   . Failure to thrive in adult     Problem List: Patient Active Problem List   Diagnosis Date Noted  . Chest pain 05/20/2015  . GAD (generalized anxiety disorder) 04/17/2015  . Panic disorder 04/17/2015  . Alcohol withdrawal 04/16/2015  . Abdominal pain, generalized 04/16/2015  . Vomiting  and diarrhea   . Failure to thrive in adult   . Intractable nausea and vomiting 03/01/2015  . Hypokalemia 03/01/2015  . Protein-calorie malnutrition, severe 01/05/2015  . Alcohol abuse   . Generalized anxiety disorder 11/26/2014    Class: Chronic  . Alcohol use disorder, severe, dependence 11/25/2014  . Adnexal mass 04/10/2012    Past Surgical History: Past Surgical History  Procedure Laterality Date  . Cesarean section    . Chest tube insertion    .  Esophagogastroduodenoscopy N/A 01/06/2015    Procedure: ESOPHAGOGASTRODUODENOSCOPY (EGD);  Surgeon: Josefine Class, MD;  Location: [facility] ENDOSCOPY;  Service: Endoscopy;  Laterality: N/A;  . Colonoscopy with propofol N/A 01/06/2015    Procedure: COLONOSCOPY WITH PROPOFOL;  Surgeon: Josefine Class, MD;  Location: [facility] ENDOSCOPY;  Service: Endoscopy;  Laterality: N/A;  . Peg placement N/A 01/11/2015    Procedure: PERCUTANEOUS ENDOSCOPIC GASTROSTOMY (PEG) PLACEMENT;  Surgeon: Josefine Class, MD;  Location: [facility] ENDOSCOPY;  Service: Endoscopy;  Laterality: N/A;  . Esophagogastroduodenoscopy N/A 01/11/2015    Rein-normal with PEG placement    Allergies: Allergies  Allergen Reactions  . Effexor [Venlafaxine] Anaphylaxis  . Sulfa Antibiotics Anaphylaxis  . Tetracyclines & Related Anaphylaxis  . Trazodone And Nefazodone Anaphylaxis  . Latex Hives, Itching and Rash  . Paxil [Paroxetine] Hives, Itching and Rash  . Seroquel [Quetiapine Fumarate] Hives, Itching and Rash  . Zoloft [Sertraline Hcl] Hives, Itching and Rash    Home Medications: Prescriptions prior to admission  Medication Sig Dispense Refill Last Dose  . folic acid (FOLVITE) 1 MG tablet Take 1 tablet (1 mg total) by mouth daily. 30 tablet 0 05/20/2015 at Unknown time  . LORazepam (ATIVAN) 1 MG tablet Take 1 tablet (1 mg total) by mouth every 6 (six) hours as needed for anxiety. 32 tablet 0 05/19/2015 at Unknown time  . Magnesium Oxide 400 (240 MG) MG TABS Take 400 mg by mouth 2 (two) times daily. 60 tablet 0 05/19/2015 at Unknown time  . Multiple Vitamin (MULTIVITAMIN WITH MINERALS) TABS tablet Take 1 tablet by mouth daily.   05/19/2015 at Unknown time  . Nutritional Supplements (FEEDING SUPPLEMENT, JEVITY 1.5 CAL/FIBER,) LIQD Place 240 mLs into feeding tube 4 (four) times daily. 28800 mL 1 Past Week at Unknown time  . potassium chloride (K-DUR) 10 MEQ tablet Take 1 tablet (10 mEq total) by mouth 2 (two) times daily. 60 tablet 0  05/19/2015 at Unknown time  . thiamine 100 MG tablet Take 1 tablet (100 mg total) by mouth daily. 7 tablet 0 05/19/2015 at Unknown time  . cephALEXin (KEFLEX) 500 MG capsule Take 1 capsule (500 mg total) by mouth 3 (three) times daily. (Patient not taking: Reported on 04/29/2015) 6 capsule 0 Not Taking  . omeprazole (PRILOSEC) 20 MG capsule Take 1 capsule (20 mg total) by mouth daily. (Patient not taking: Reported on 05/20/2015) 30 capsule 0 Taking   Home medication reconciliation was completed with the patient.   Scheduled Inpatient Medications:   . aspirin  81 mg Oral Daily  . enoxaparin (LOVENOX) injection  30 mg Subcutaneous Q24H  . feeding supplement (JEVITY 1.5 CAL/FIBER)  120 mL Per Tube QID  . folic acid  1 mg Oral Daily  . free water  200 mL Per Tube 3 times per day  . magnesium oxide  400 mg Oral BID  . multivitamin with minerals  1 tablet Oral Daily  . potassium chloride  10 mEq Oral BID  . thiamine  100 mg  Oral Daily    Continuous Inpatient Infusions:     PRN Inpatient Medications:  LORazepam  Family History: family history includes Cancer in her father; Crohn's disease in her brother; Stroke in her other; Thyroid disease in her mother. There is no history of Anesthesia problems.   GI Family History: Noted  Social History:   reports that she has been smoking Cigarettes.  She has been smoking about 2.00 packs per day. She has never used smokeless tobacco. She reports that she drinks about 21.6 oz of alcohol per week. She reports that she does not use illicit drugs.  ROS  Review of Systems: 10 systems reviewed per admission history and physical agree with same  Physical Examination: BP 90/62 mmHg  Pulse 78  Temp(Src) 98.1 F (36.7 C) (Oral)  Resp 18  Ht 5' 6"  (1.676 m)  Wt 34.473 kg (76 lb)  BMI 12.27 kg/m2  SpO2 97% Gen: Thin 29 year old female no acute distress HEENT: Normocephalic atraumatic mildly jaundiced Neck: Supple no JVD no lymphadenopathy no  thyromegaly Chest: Clear to auscultation CV: Regular rate and rhythm Abd: Soft positive tender to palpation in the right upper quadrant tending toward midline. There no masses or rebound. Liver is firm to palpation. There is a mild percussive tenderness. Ext: No clubbing cyanosis or edema Skin: Jaundiced as noted Other:  Data: Lab Results  Component Value Date   WBC 8.9 05/20/2015   HGB 14.3 05/20/2015   HCT 41.9 05/20/2015   MCV 93.4 05/20/2015   PLT 95* 05/20/2015    Recent Labs Lab 05/20/15 1049  HGB 14.3   Lab Results  Component Value Date   NA 134* 05/20/2015   K 3.5 05/20/2015   CL 96* 05/20/2015   CO2 28 05/20/2015   BUN 8 05/20/2015   CREATININE 0.42* 05/20/2015   Lab Results  Component Value Date   ALT 61* 05/21/2015   AST 194* 05/21/2015   ALKPHOS 505* 05/21/2015   BILITOT 6.6* 05/21/2015   No results for input(s): APTT, INR, PTT in the last 168 hours. CBC Latest Ref Rng 05/20/2015 04/17/2015 04/16/2015  WBC 3.6 - 11.0 K/uL 8.9 15.0(H) 12.8(H)  Hemoglobin 12.0 - 16.0 g/dL 14.3 12.7 18.5(H)  Hematocrit 35.0 - 47.0 % 41.9 37.6 55.0(H)  Platelets 150 - 440 K/uL 95(L) 122(L) 208    STUDIES: Dg Chest 2 View  05/20/2015   CLINICAL DATA:  Pt states that she has been diagnosed with colitis and a type of hepatitis, pt has a feeding tube, continues to vomit, weakness, productive cough, smoker.  EXAM: CHEST  2 VIEW  COMPARISON:  03/01/2015  FINDINGS: Lungs are hyperinflated. Heart size is normal. No focal consolidations or pleural effusions. No pulmonary edema.  IMPRESSION: No active cardiopulmonary disease.   Electronically Signed   By: Nolon Nations M.D.   On: 05/20/2015 13:10   Ct Angio Chest Pe W/cm &/or Wo Cm  05/20/2015   CLINICAL DATA:  29 year old with cough and shortness of breath. Anterior chest pain for 48 hours. History of spontaneous pneumothoraces.  EXAM: CT ANGIOGRAPHY CHEST WITH CONTRAST  TECHNIQUE: Multidetector CT imaging of the chest was performed  using the standard protocol during bolus administration of intravenous contrast. Multiplanar CT image reconstructions and MIPs were obtained to evaluate the vascular anatomy.  CONTRAST:  85m OMNIPAQUE IOHEXOL 350 MG/ML SOLN  COMPARISON:  Chest radiograph 05/20/2015  FINDINGS: Negative for pulmonary embolism. Normal appearance of the thoracic aorta and the great vessels are patent. Diffuse low-attenuation of the liver  suggests diffuse hepatic steatosis. There is no significant pericardial or pleural fluid. No evidence for chest lymphadenopathy.  The trachea and mainstem bronchi are patent. Negative for pneumothorax. Minimal scarring at the lung apices. Few subtle densities in the lingula, best seen on sequence 8, image 108. No large areas of consolidation.  No acute bone abnormality.  Review of the MIP images confirms the above findings.  IMPRESSION: Negative for pulmonary embolism.  Subtle ground-glass densities in the lingula. Findings could represent a small focus of pneumonia.  Diffuse low density of the liver is suggestive for hepatic steatosis.   Electronically Signed   By: Markus Daft M.D.   On: 05/20/2015 16:49   @IMAGES @  Assessment: 1. Elevated liver enzymes in a mixed possibly cholestatic pattern. Likely related with this most recent episode of binge drinking, alcohol related hepatitis.. She has had a relatively extensive evaluation for liver disease in the past that was uninformative for other etiologies. In review has not been a ceruloplasmin drawn.  Recommendations: 1. Would repeat abdominal ultrasound to rule out bile duct dilatation 2. Obtain serum ceruloplasmin and a ProTime. 3. Daily LFTs and PTT 4. Patient does express the desire to see the psychiatry for consultation in regards to her history of alcohol abuse and need for abstinence. 5. I'll talk to Dr. Rayann Heman about the appliance problem with the PEG. He will not be available until Monday. 6. Advance diet to full liquid  Thank you for  the consult. Please call with questions or concerns.  Lollie Sails, MD  05/21/2015 6:10 PM

## 2015-05-21 NOTE — Progress Notes (Signed)
Initial Nutrition Assessment  DOCUMENTATION CODES:   Severe malnutrition in context of chronic illness  INTERVENTION:   Coordination of Care: discussed pt nutrition hx and current poc with MD Tressia Miners; recommend checking magnesium and phosphorus and following electrolytes throughout admission as pt is at risk for refeeding syndrome EN: pt is agreeable with Jevity 1.5 1 can 4 times daily as scheduled (provides 1420 kcals, 60 g of protein); but due to the fact that pt is at high risk for refeeding syndrome due to poor intake, continued wt loss, EtOH abuse and underweight status (pt has only been taking at max 1 can per day at home), recommend starting with 1/2 can of Jevity 1.5 (120 mL) 4 times daily. Will assess from there.  Meals/Snacks: cater to pt preferences  NUTRITION DIAGNOSIS:   Inadequate oral intake related to poor appetite, social / environmental circumstances as evidenced by percent weight loss, per patient/family report, severe depletion of body fat, severe depletion of muscle mass.  GOAL:   Patient will meet greater than or equal to 90% of their needs   MONITOR:    (Energy Intake, EN, Digestive System, Anthropometrics, Electrolyte/Renal Profile, Glucose Profile)  REASON FOR ASSESSMENT:   Consult Poor PO  ASSESSMENT:    Pt admitted with chest pain, severe EtOH abuse, poor po intake. Pt with hx of PEG placement due to FTT, malnutrition. Noted GI and psych consults pending.   Past Medical History  Diagnosis Date  . Anxiety   . Chlamydia 2007  . Irregular heart beat 2010    wt/diet related after evaluation  . Renal disorder   . Pneumothorax, spontaneous, tension   . Alcohol abuse   . Pancreas divisum   . Failure to thrive in adult     Diet Order:  DIET SOFT Room service appropriate?: Yes; Fluid consistency:: Thin   Energy Intake: pt had not touched breakfast tray this AM that consisted of eggs, bagel and juice. Pt reported that she was not ready yet. Pt  reports she tolerated Jevity 1.5 1 can feeding last night without issues  Food and Nutrition Related History: per MD note, pt drinking 2-3 large cans of beer per day; has been tolerating beer but not her Jevity feeding. On visit today, pt reports she has been doing 0-1 cans of Jevity per day at home but cannot elaborate on why she is not doing it more often. Pt reports she is eating food, when asked what she eats pt reports sandwiches, bagels, eggs. Noted pt was seen by outpatient dietitian on 04/29/15 for wt gain, improving oral intake. Noted it has been recommended that pt take 3-4 cans of Jevity 1.5 per day. Also of note, there has been concern for possible eating disorder on previous admissions.   Digestive System: pt denies N/V/abdominal pain at present time  Last BM:  9/23   Electrolyte and Renal Profile:  Recent Labs Lab 05/20/15 1049  BUN 8  CREATININE 0.42*  NA 134*  K 3.5   Meds: MVI, magox, thiamine, potassium chloride, folic acid  Height:   Ht Readings from Last 1 Encounters:  05/20/15 5' 6"  (1.676 m)   Nutrition Focused Physical Exam: Nutrition-Focused physical exam completed. Findings are severe fat depletion, severe muscle depletion  Weight: pt acknowledges wt loss; 9.5% wt loss in 1 month  Wt Readings from Last 1 Encounters:  05/20/15 76 lb (34.473 kg)   Filed Weights   05/20/15 1041  Weight: 76 lb (34.473 kg)   Wt Readings from Last  10 Encounters:  05/20/15 76 lb (34.473 kg)  04/29/15 84 lb 12.8 oz (38.465 kg)  04/18/15 86 lb 1.6 oz (39.055 kg)  03/21/15 81 lb (36.741 kg)  03/01/15 92 lb 8 oz (41.958 kg)  01/13/15 95 lb (43.092 kg)  04/24/12 99 lb 3.2 oz (44.997 kg)  04/10/12 99 lb 4.8 oz (45.042 kg)  08/31/11 94 lb 6.4 oz (42.82 kg)    BMI:  Body mass index is 12.27 kg/(m^2).  Estimated Nutritional Needs:   Kcal:  1323-1653 kcals (BEE 1272, 1.3 AF, 0.8-1.0 IF)   Protein:  42-53 g (0.8-1.0 g/kg)   Fluid:  1590-1855 mL (30-35 ml/kg)   HIGH Care  Level  Kerman Passey MS, RD, LDN [phone] Pager

## 2015-05-21 NOTE — Progress Notes (Signed)
Pt tolerating regular meals and complying with tube feedings as ordered. Ativan given per request as needed. Denies pain. Peg tube intact/patent. 4x4 gauze placed around tube per order.

## 2015-05-21 NOTE — Consult Note (Signed)
[facility] Consult   Reason for Consult:  Depression Referring Physician:  Gladstone Lighter, MD  Patient Identification: Kristine Macias MRN:  [id number]   Principal Diagnosis: Failure to thrive in adult   Diagnosis:   Patient Active Problem List   Diagnosis Date Noted  . Chest pain [R07.9] 05/20/2015  . GAD (generalized anxiety disorder) [F41.1] 04/17/2015  . Panic disorder [F41.0] 04/17/2015  . Alcohol withdrawal [F10.239] 04/16/2015  . Abdominal pain, generalized [R10.84] 04/16/2015  . Vomiting and diarrhea [R11.10, R19.7]   . Failure to thrive in adult [R62.7]   . Intractable nausea and vomiting [R11.10] 03/01/2015  . Hypokalemia [E87.6] 03/01/2015  . Protein-calorie malnutrition, severe [E43] 01/05/2015  . Alcohol abuse [F10.10]   . Generalized anxiety disorder [F41.1] 11/26/2014    Class: Chronic  . Alcohol use disorder, severe, dependence [F10.20] 11/25/2014  . Adnexal mass [N94.9] 04/10/2012    Total Time spent with patient: 30 minutes  Subjective:   Kristine Macias is a 29 y.o. female patient admitted with severe protein calorie malnutrition has PEG tube feeding, Crohn's ileitis, alcohol use disorder, severe who was admitted to [facility] for primarily right sided stomach pain of several weeks' duration and 2 days of centrally located dull chest pain. Admission lipase was low at 16. Troponin on 05-21-15 was elevated at 0.04 which is trending down from 0.05 on 05-20-15. Hepatic function panel demonstrates elevated alkaline phosphatase, AST, ALT, total bilirubin, direct bilirubin and indirect bilirubin. Total protein and albumin are both decreased. D-Dimer is elevated at 1407. Serum creatinine is elevated at 0.42 on 05-20-15.  Urine pregnancy test is negative. UDS and Ethanol were not obtained on ED presentation.   Today, Brylinn is resting comfortably in bed using her laptop computer. The pt currently denies anxiety but shared she has medications which she  takes for anxiety at home. Her current sadness is related to "missing her husband and father" and a fear of death. She felt she was doing well for a while but with continued weight loss, she is fearful she will die from her disease. She does not want to die and states, "I am going to fight this thing." She has two young children who keep her alive from day to day.   Ms. Faller shared she has good and bad days. She feels she has 1 bad day every two weeks and when this occurs, she will return to using alcohol again.  She was tearful during half of the interview and discussed not wanting to use alcohol. She said her appetite has been poor and she is variably tolerating feedings.   She endorses issues with the following: hopelessness, helplessness, decreased energy and appetite. She denies issues with the following: sleep, interests, guilty, concentration and psychomotor retardation. She also denies symptoms consistent with generalized anxiety disorder and panic disorder.   She was attending psychiatric and psychological care at [facility] in [place], [place] [phone]). She said she was recently discharged from their practice due to missing apts. She noted she may be able to return to their clinic if she has a note that states her medical condition can cause her to miss apts.   Pt discussed her past medication trials which include multiple SSRIs & SNRIs. Pt has not tried the [facility] patch and noted she will consider this and possibly would be willing to start it tomorrow. I checked and EMSAM is on the hospital's formulary.   Pt currently denies SI, HI and AVH. She also notes her protective  factors include her children. The pt worries frequently about her children and her mother's poor health.   HPI:  See above HPI Elements:   Quality:  worsening. Severity:  severe. Timing:  the past several days. Duration:  many years. Context:  medical illnesses.  Past Medical History:  Past Medical  History  Diagnosis Date  . Anxiety   . Chlamydia 2007  . Irregular heart beat 2010    wt/diet related after evaluation  . Renal disorder   . Pneumothorax, spontaneous, tension   . Alcohol abuse   . Pancreas divisum   . Failure to thrive in adult     Past Surgical History  Procedure Laterality Date  . Cesarean section    . Chest tube insertion    . Esophagogastroduodenoscopy N/A 01/06/2015    Procedure: ESOPHAGOGASTRODUODENOSCOPY (EGD);  Surgeon: Josefine Class, MD;  Location: [facility] ENDOSCOPY;  Service: Endoscopy;  Laterality: N/A;  . Colonoscopy with propofol N/A 01/06/2015    Procedure: COLONOSCOPY WITH PROPOFOL;  Surgeon: Josefine Class, MD;  Location: [facility] ENDOSCOPY;  Service: Endoscopy;  Laterality: N/A;  . Peg placement N/A 01/11/2015    Procedure: PERCUTANEOUS ENDOSCOPIC GASTROSTOMY (PEG) PLACEMENT;  Surgeon: Josefine Class, MD;  Location: [facility] ENDOSCOPY;  Service: Endoscopy;  Laterality: N/A;  . Esophagogastroduodenoscopy N/A 01/11/2015    Rein-normal with PEG placement   Family History:  Family History  Problem Relation Age of Onset  . Anesthesia problems Neg Hx   . Thyroid disease Mother   . Cancer Father   . Stroke Other   . Crohn's disease Brother    Social History:  History  Alcohol Use  . 21.6 oz/week  . 58 Cans of beer per week    Comment: 6 beers/day; trying to quit, participating in AA     History  Drug Use No    Social History   Social History  . Marital Status: Single    Spouse Name: N/A  . Number of Children: N/A  . Years of Education: N/A   Social History Main Topics  . Smoking status: Current Every Day Smoker -- 2.00 packs/day    Types: Cigarettes  . Smokeless tobacco: Never Used  . Alcohol Use: 21.6 oz/week    36 Cans of beer per week     Comment: 6 beers/day; trying to quit, participating in [place]  . Drug Use: No  . Sexual Activity: Yes    Birth Control/ Protection: None   Other Topics Concern  . None   Social History  Narrative   Lives with mother & 2 children   disabled   Additional Social History:                          Allergies:   Allergies  Allergen Reactions  . Effexor [Venlafaxine] Anaphylaxis  . Sulfa Antibiotics Anaphylaxis  . Tetracyclines & Related Anaphylaxis  . Trazodone And Nefazodone Anaphylaxis  . Latex Hives, Itching and Rash  . Paxil [Paroxetine] Hives, Itching and Rash  . Seroquel [Quetiapine Fumarate] Hives, Itching and Rash  . Zoloft [Sertraline Hcl] Hives, Itching and Rash    Labs:  Results for orders placed or performed during the hospital encounter of 05/20/15 (from the past 48 hour(s))  Basic metabolic panel     Status: Abnormal   Collection Time: 05/20/15 10:49 AM  Result Value Ref Range   Sodium 134 (L) 135 - 145 mmol/L   Potassium 3.5 3.5 - 5.1 mmol/L  Chloride 96 (L) 101 - 111 mmol/L   CO2 28 22 - 32 mmol/L   Glucose, Bld 114 (H) 65 - 99 mg/dL   BUN 8 6 - 20 mg/dL   Creatinine, Ser 0.42 (L) 0.44 - 1.00 mg/dL   Calcium 8.0 (L) 8.9 - 10.3 mg/dL   GFR calc non Af Amer >60 >60 mL/min   GFR calc Af Amer >60 >60 mL/min    Comment: (NOTE) The eGFR has been calculated using the CKD EPI equation. This calculation has not been validated in all clinical situations. eGFR's persistently <60 mL/min signify possible Chronic Kidney Disease.    Anion gap 10 5 - 15  CBC     Status: Abnormal   Collection Time: 05/20/15 10:49 AM  Result Value Ref Range   WBC 8.9 3.6 - 11.0 K/uL   RBC 4.48 3.80 - 5.20 MIL/uL   Hemoglobin 14.3 12.0 - 16.0 g/dL   HCT 41.9 35.0 - 47.0 %   MCV 93.4 80.0 - 100.0 fL   MCH 31.9 26.0 - 34.0 pg   MCHC 34.1 32.0 - 36.0 g/dL   RDW 17.0 (H) 11.5 - 14.5 %   Platelets 95 (L) 150 - 440 K/uL  Troponin I     Status: Abnormal   Collection Time: 05/20/15 10:49 AM  Result Value Ref Range   Troponin I 0.05 (H) <0.031 ng/mL    Comment: READ BACK AND VERIFIED KESEY PIERCE_0  ON 05/20/15 BY HP        PERSISTENTLY INCREASED  TROPONIN VALUES IN THE RANGE OF 0.04-0.49 ng/mL CAN BE SEEN IN:       -UNSTABLE ANGINA       -CONGESTIVE HEART FAILURE       -MYOCARDITIS       -CHEST TRAUMA       -ARRYHTHMIAS       -LATE PRESENTING MYOCARDIAL INFARCTION       -COPD   CLINICAL FOLLOW-UP RECOMMENDED.   Hepatic function panel     Status: Abnormal   Collection Time: 05/20/15 10:49 AM  Result Value Ref Range   Total Protein 6.1 (L) 6.5 - 8.1 g/dL   Albumin 2.3 (L) 3.5 - 5.0 g/dL   AST 196 (H) 15 - 41 U/L   ALT 71 (H) 14 - 54 U/L   Alkaline Phosphatase 566 (H) 38 - 126 U/L   Total Bilirubin 6.5 (H) 0.3 - 1.2 mg/dL   Bilirubin, Direct 4.2 (H) 0.1 - 0.5 mg/dL   Indirect Bilirubin 2.3 (H) 0.3 - 0.9 mg/dL  Lipase, blood     Status: Abnormal   Collection Time: 05/20/15 10:49 AM  Result Value Ref Range   Lipase 16 (L) 22 - 51 U/L  Fibrin derivatives D-Dimer ([facility] only)     Status: Abnormal   Collection Time: 05/20/15 10:49 AM  Result Value Ref Range   Fibrin derivatives D-dimer ([facility]) 1407 (H) 0 - 499    Comment: <> Exclusion of Venous Thromboembolism (VTE) - OUTPATIENTS ONLY        (Emergency Department or [place])             0-499 ng/ml (FEU)  : With a low to intermediate pretest                                        probability for VTE this test result  excludes the diagnosis of VTE.           > 499 ng/ml (FEU)  : VTE not excluded.  Additional work up                                   for VTE is required.   <>  Testing on Inpatients and Evaluation of Disseminated Intravascular        Coagulation (DIC)             Reference Range:   0-499 ng/ml (FEU)   Urinalysis complete, with microscopic ([facility] only)     Status: Abnormal   Collection Time: 05/20/15  2:14 PM  Result Value Ref Range   Color, Urine AMBER (A) YELLOW   APPearance CLEAR (A) CLEAR   Glucose, UA NEGATIVE NEGATIVE mg/dL   Bilirubin Urine 2+ (A) NEGATIVE   Ketones, ur TRACE (A) NEGATIVE mg/dL   Specific  Gravity, Urine 1.021 1.005 - 1.030   Hgb urine dipstick NEGATIVE NEGATIVE   pH 6.0 5.0 - 8.0   Protein, ur NEGATIVE NEGATIVE mg/dL   Nitrite NEGATIVE NEGATIVE   Leukocytes, UA NEGATIVE NEGATIVE   RBC / HPF 0-5 0 - 5 RBC/hpf   WBC, UA 6-30 0 - 5 WBC/hpf   Bacteria, UA NONE SEEN NONE SEEN   Squamous Epithelial / LPF 0-5 (A) NONE SEEN   Mucous PRESENT   Troponin I     Status: None   Collection Time: 05/20/15  6:19 PM  Result Value Ref Range   Troponin I 0.03 <0.031 ng/mL    Comment:        NO INDICATION OF MYOCARDIAL INJURY.   Troponin I     Status: None   Collection Time: 05/21/15  2:02 AM  Result Value Ref Range   Troponin I 0.03 <0.031 ng/mL    Comment:        NO INDICATION OF MYOCARDIAL INJURY.   Troponin I     Status: Abnormal   Collection Time: 05/21/15 10:15 AM  Result Value Ref Range   Troponin I 0.04 (H) <0.031 ng/mL    Comment: READ BACK AND VERIFIED WITH JAN  PFLUMM AT 1113 05/21/15 SDR        PERSISTENTLY INCREASED TROPONIN VALUES IN THE RANGE OF 0.04-0.49 ng/mL CAN BE SEEN IN:       -UNSTABLE ANGINA       -CONGESTIVE HEART FAILURE       -MYOCARDITIS       -CHEST TRAUMA       -ARRYHTHMIAS       -LATE PRESENTING MYOCARDIAL INFARCTION       -COPD   CLINICAL FOLLOW-UP RECOMMENDED.   Magnesium     Status: None   Collection Time: 05/21/15 10:15 AM  Result Value Ref Range   Magnesium 1.7 1.7 - 2.4 mg/dL  Phosphorus     Status: None   Collection Time: 05/21/15 10:15 AM  Result Value Ref Range   Phosphorus 3.7 2.5 - 4.6 mg/dL  Hepatic function panel     Status: Abnormal   Collection Time: 05/21/15 10:15 AM  Result Value Ref Range   Total Protein 5.5 (L) 6.5 - 8.1 g/dL   Albumin 2.1 (L) 3.5 - 5.0 g/dL   AST 194 (H) 15 - 41 U/L   ALT 61 (H) 14 - 54 U/L   Alkaline Phosphatase 505 (H) 38 - 126 U/L   Total  Bilirubin 6.6 (H) 0.3 - 1.2 mg/dL   Bilirubin, Direct 4.1 (H) 0.1 - 0.5 mg/dL   Indirect Bilirubin 2.5 (H) 0.3 - 0.9 mg/dL    Vitals: Blood pressure  90/62, pulse 78, temperature 98.1 F (36.7 C), temperature source Oral, resp. rate 18, height _0  (1.676 m), weight 34.473 kg (76 lb), SpO2 97 %.  Risk to Self: Is patient at risk for suicide?: No Risk to Others:   Prior Inpatient Therapy:   Prior Outpatient Therapy:    Current Facility-Administered Medications  Medication Dose Route Frequency Provider Last Rate Last Dose  . aspirin chewable tablet 81 mg  81 mg Oral Daily Fritzi Mandes, MD   81 mg at 05/20/15 1819  . enoxaparin (LOVENOX) injection 30 mg  30 mg Subcutaneous Q24H Ramond Dial, RPH      . feeding supplement (JEVITY 1.5 CAL/FIBER) liquid 120 mL  120 mL Per Tube QID Gladstone Lighter, MD   237 mL at 05/21/15 1111  . folic acid (FOLVITE) tablet 1 mg  1 mg Oral Daily Fritzi Mandes, MD   1 mg at 05/21/15 1006  . free water 200 mL  200 mL Per Tube 3 times per day Gladstone Lighter, MD   200 mL at 05/21/15 1114  . LORazepam (ATIVAN) tablet 1 mg  1 mg Oral Q6H PRN Fritzi Mandes, MD   1 mg at 05/21/15 1006  . magnesium oxide (MAG-OX) tablet 400 mg  400 mg Oral BID Fritzi Mandes, MD   400 mg at 05/21/15 1006  . multivitamin with minerals tablet 1 tablet  1 tablet Oral Daily Fritzi Mandes, MD   1 tablet at 05/21/15 1006  . potassium chloride (K-DUR) CR tablet 10 mEq  10 mEq Oral BID Fritzi Mandes, MD   10 mEq at 05/21/15 1006  . thiamine (VITAMIN B-1) tablet 100 mg  100 mg Oral Daily Fritzi Mandes, MD   100 mg at 05/21/15 1006    Musculoskeletal: Strength & Muscle Tone: flaccid Gait & Station: not assessed due to patient weakness Patient leans: N/A  Psychiatric Specialty Exam: Physical Exam  ROS  Blood pressure 90/62, pulse 78, temperature 98.1 F (36.7 C), temperature source Oral, resp. rate 18, height _1  (1.676 m), weight 34.473 kg (76 lb), SpO2 97 %.Body mass index is 12.27 kg/(m^2).  General Appearance: Casual and thin, gaunt  Eye Contact::  Good  Speech:  Clear and Coherent, Normal Rate and normal tone and volume  Volume:  Normal   Mood:  Hopeless and sad  Affect:  Full Range  Thought Process:  Coherent, Goal Directed, Intact, Linear and Logical  Orientation:  Full (Time, Place, and Person)  Thought Content:  Rumination and worries frequently  Suicidal Thoughts:  No  Homicidal Thoughts:  No  Memory:  Immediate;   Good Recent;   Good Remote;   Good  Judgement:  Fair  Insight:  Good  Psychomotor Activity:  Decreased  Concentration:  Good  Recall:  Good  Fund of Knowledge:Good  Language: Good  Akathisia:  Negative  Handed:  Ambidextrous  AIMS (if indicated):     Assets:  Communication Skills Desire for Improvement Financial Resources/Insurance Housing Social Support Talents/Skills  ADL's:  Intact  Cognition: WNL  Sleep:  normal   Medical Decision Making: Review of Psycho-Social Stressors (1), Review or order clinical lab tests (1), Established Problem, Worsening (2), Review or order medicine tests (1), Independent Review of image, tracing or specimen (2) and Review of Medication Regimen & Side  Effects (2)  Treatment Plan Summary: Medication management and Plan patient would benefit from an antidepressant which would bypass first pass metabolism such as EMSAM.   - will need to determine medication interactions with EMSAM and if this is appropriate for this patient.   - if appropriate, please consider a medication which may increase her appetite and improve weight gain.   Plan:  No evidence of imminent risk to self or others at present.   Patient does not meet criteria for psychiatric inpatient admission. Supportive therapy provided about ongoing stressors. Discussed crisis plan, support from social network, calling 911, coming to the Emergency Department, and calling Suicide Hotline.   Disposition: Home when medically stable.  Donita Brooks 05/21/2015 2:28 PM

## 2015-05-21 NOTE — Progress Notes (Signed)
Pt. Arrived to floor via stretcher, pt. General room orientation given, call bell and ascom explained in detail, tele placed, pt. Alert and oriented, will continuie to monitor pt. Skin assessment verified by CK, RN. Skin is warm and dry. Button peg to LUQ. Will continue to monitor pt.

## 2015-05-21 NOTE — Progress Notes (Signed)
[facility] [place] NAME: Alizeh Madril    MR#:  [id number]  DATE OF BIRTH:  [birth year]  SUBJECTIVE:  CHIEF COMPLAINT:   Chief Complaint  Patient presents with  . Weakness   - Patient since very malnourished at baseline. Taking some bagel bites this morning. -Has a PEG tube, questionable history of anorexia. -Chest pain is easing off. Troponins remain negative.  REVIEW OF SYSTEMS:  Review of Systems  Constitutional: Negative for fever and chills.  HENT: Negative for ear discharge, ear pain and nosebleeds.   Eyes: Negative for blurred vision and double vision.  Respiratory: Negative for cough, sputum production, shortness of breath and wheezing.   Cardiovascular: Positive for chest pain. Negative for palpitations.  Gastrointestinal: Negative for nausea, vomiting, abdominal pain, diarrhea and constipation.  Genitourinary: Negative for dysuria and frequency.  Musculoskeletal: Negative for myalgias and back pain.  Skin: Negative for rash.  Neurological: Positive for weakness. Negative for dizziness, sensory change, speech change, focal weakness, seizures and headaches.    DRUG ALLERGIES:   Allergies  Allergen Reactions  . Effexor [Venlafaxine] Anaphylaxis  . Sulfa Antibiotics Anaphylaxis  . Tetracyclines & Related Anaphylaxis  . Trazodone And Nefazodone Anaphylaxis  . Latex Hives, Itching and Rash  . Paxil [Paroxetine] Hives, Itching and Rash  . Seroquel [Quetiapine Fumarate] Hives, Itching and Rash  . Zoloft [Sertraline Hcl] Hives, Itching and Rash    VITALS:  Blood pressure 90/62, pulse 78, temperature 98.1 F (36.7 C), temperature source Oral, resp. rate 18, height 5' 6"  (1.676 m), weight 34.473 kg (76 lb), SpO2 97 %.  PHYSICAL EXAMINATION:  Physical Exam  GENERAL:  29 y.o.-year-old, thin appearing patient lying in the bed with no acute distress.  EYES: Pupils equal, round, reactive to light and accommodation.  No scleral icterus. Extraocular muscles intact.  HEENT: Head atraumatic, normocephalic. Oropharynx and nasopharynx clear.  NECK:  Supple, no jugular venous distention. No thyroid enlargement, no tenderness.  LUNGS: Normal breath sounds bilaterally, no wheezing, rales,rhonchi or crepitation. No use of accessory muscles of respiration.  CARDIOVASCULAR: S1, S2 normal. No murmurs, rubs, or gallops.  ABDOMEN: Soft, nontender, nondistended. Bowel sounds present. No organomegaly or mass. PEG tube in place. EXTREMITIES: No pedal edema, cyanosis, or clubbing.  NEUROLOGIC: Cranial nerves II through XII are intact. Muscle strength 5/5 in all extremities. Sensation intact. Gait not checked.  PSYCHIATRIC: The patient is alert and oriented x 3.  SKIN: No obvious rash, lesion, or ulcer.    LABORATORY PANEL:   CBC  Recent Labs Lab 05/20/15 1049  WBC 8.9  HGB 14.3  HCT 41.9  PLT 95*   ------------------------------------------------------------------------------------------------------------------  Chemistries   Recent Labs Lab 05/20/15 1049 05/21/15 1015  NA 134*  --   K 3.5  --   CL 96*  --   CO2 28  --   GLUCOSE 114*  --   BUN 8  --   CREATININE 0.42*  --   CALCIUM 8.0*  --   MG  --  1.7  AST 196*  --   ALT 71*  --   ALKPHOS 566*  --   BILITOT 6.5*  --    ------------------------------------------------------------------------------------------------------------------  Cardiac Enzymes  Recent Labs Lab 05/21/15 1015  TROPONINI 0.04*   ------------------------------------------------------------------------------------------------------------------  RADIOLOGY:  Dg Chest 2 View  05/20/2015   CLINICAL DATA:  Pt states that she has been diagnosed with colitis and a type of hepatitis, pt has a feeding tube, continues to  vomit, weakness, productive cough, smoker.  EXAM: CHEST  2 VIEW  COMPARISON:  03/01/2015  FINDINGS: Lungs are hyperinflated. Heart size is normal. No focal  consolidations or pleural effusions. No pulmonary edema.  IMPRESSION: No active cardiopulmonary disease.   Electronically Signed   By: Nolon Nations M.D.   On: 05/20/2015 13:10   Ct Angio Chest Pe W/cm &/or Wo Cm  05/20/2015   CLINICAL DATA:  29 year old with cough and shortness of breath. Anterior chest pain for 48 hours. History of spontaneous pneumothoraces.  EXAM: CT ANGIOGRAPHY CHEST WITH CONTRAST  TECHNIQUE: Multidetector CT imaging of the chest was performed using the standard protocol during bolus administration of intravenous contrast. Multiplanar CT image reconstructions and MIPs were obtained to evaluate the vascular anatomy.  CONTRAST:  79m OMNIPAQUE IOHEXOL 350 MG/ML SOLN  COMPARISON:  Chest radiograph 05/20/2015  FINDINGS: Negative for pulmonary embolism. Normal appearance of the thoracic aorta and the great vessels are patent. Diffuse low-attenuation of the liver suggests diffuse hepatic steatosis. There is no significant pericardial or pleural fluid. No evidence for chest lymphadenopathy.  The trachea and mainstem bronchi are patent. Negative for pneumothorax. Minimal scarring at the lung apices. Few subtle densities in the lingula, best seen on sequence 8, image 108. No large areas of consolidation.  No acute bone abnormality.  Review of the MIP images confirms the above findings.  IMPRESSION: Negative for pulmonary embolism.  Subtle ground-glass densities in the lingula. Findings could represent a small focus of pneumonia.  Diffuse low density of the liver is suggestive for hepatic steatosis.   Electronically Signed   By: AMarkus DaftM.D.   On: 05/20/2015 16:49    EKG:   Orders placed or performed during the hospital encounter of 05/20/15  . ED EKG within 10 minutes  . ED EKG within 10 minutes  . EKG 12-Lead  . EKG 12-Lead    ASSESSMENT AND PLAN:   29year old STabithia Stroderwith past medical history of severe profound malnutrition status post PEG placement and PEG feeding,  severe alcohol dependence, depression comes to the emergency room with chest pain.  1. Chest pain- ? Musculoskeletal chest pain Mildly elevated troponin, plateaued now Appears atypical chest pain. No cardiac history. EKG shows sinus tachycardia and no acute ST elevation or depression. Monitor for now and pain control  2. Severe alcohol dependence and abuse. Advised alcohol cessation. We'll continue CIWA protocol Psych consult  3. Elevated bilirubin, LDH and chronic transaminitis Last ultrasound of the abdomen which showed diffuse hepatic steatosis no gallstones.  Bilirubin on admission is 6.0. Last bilirubin was 1.5  GI consulted No significant abd pain- not sure if from severe alc abuse Will hold off on repeating [place] sees [place]  4. Depression Psych consult No suicidal ideation  5. DVT prophylaxis subcutaneous Lovenox  6. Nutrition- ? Underlying eating disorder based on previous evaluation - PEg tube- appreciate dietary consult- bolus feeds and also food by mouth as tolerated - check lytes   All the records are reviewed and case discussed with Care Management/Social Workerr. Management plans discussed with the patient, family and they are in agreement.  CODE STATUS: Full Code  TOTAL TIME TAKING CARE OF THIS PATIENT: 35 minutes.   POSSIBLE D/C IN 1-2 DAYS, DEPENDING ON CLINICAL CONDITION.   KGladstone LighterM.D on 05/21/2015 at 12:39 PM  Between 7am to 6pm - Pager - [phone]  After 6pm go to [email or web address] - [email or web address]  Office  [phone]  CC: Primary care physician; Leonides Sake, MD

## 2015-05-21 NOTE — Progress Notes (Signed)
Patient alert and oriented x4, no complaints at this time. Patient NSR on telemetry. Will continue to assess. Wilnette Kales

## 2015-05-22 ENCOUNTER — Inpatient Hospital Stay: Payer: Medicaid Other

## 2015-05-22 DIAGNOSIS — F410 Panic disorder [episodic paroxysmal anxiety] without agoraphobia: Secondary | ICD-10-CM

## 2015-05-22 LAB — BASIC METABOLIC PANEL
ANION GAP: 5 (ref 5–15)
BUN: 6 mg/dL (ref 6–20)
CHLORIDE: 101 mmol/L (ref 101–111)
CO2: 29 mmol/L (ref 22–32)
Calcium: 8.2 mg/dL — ABNORMAL LOW (ref 8.9–10.3)
Creatinine, Ser: 0.31 mg/dL — ABNORMAL LOW (ref 0.44–1.00)
GFR calc non Af Amer: >60 mL/min (ref >60)
GLUCOSE: 140 mg/dL — AB (ref 65–99)
Potassium: 3.6 mmol/L (ref 3.5–5.1)
Sodium: 135 mmol/L (ref 135–145)

## 2015-05-22 LAB — HEPATIC FUNCTION PANEL
ALK PHOS: 494 U/L — AB (ref 38–126)
ALT: 52 U/L (ref 14–54)
AST: 147 U/L — ABNORMAL HIGH (ref 15–41)
Albumin: 2 g/dL — ABNORMAL LOW (ref 3.5–5.0)
BILIRUBIN DIRECT: 3.7 mg/dL — AB (ref 0.1–0.5)
BILIRUBIN INDIRECT: 2.2 mg/dL — AB (ref 0.3–0.9)
BILIRUBIN TOTAL: 5.9 mg/dL — AB (ref 0.3–1.2)
Total Protein: 5.7 g/dL — ABNORMAL LOW (ref 6.5–8.1)

## 2015-05-22 LAB — MAGNESIUM: Magnesium: 1.7 mg/dL (ref 1.7–2.4)

## 2015-05-22 MED ORDER — MEGESTROL ACETATE 40 MG PO TABS
40.0000 mg | ORAL_TABLET | Freq: Two times a day (BID) | ORAL | Status: DC
  Administered 2015-05-22: 40 mg via ORAL
  Filled 2015-05-22 (×4): qty 1

## 2015-05-22 MED ORDER — MAGNESIUM SULFATE IN D5W 10-5 MG/ML-% IV SOLN
1.0000 g | Freq: Once | INTRAVENOUS | Status: AC
  Administered 2015-05-22: 1 g via INTRAVENOUS
  Filled 2015-05-22: qty 100

## 2015-05-22 NOTE — Care Management Note (Signed)
Case Management Note  Patient Details  Name: Kyrianna Barletta MRN: [id number] Date of Birth: 1986-06-18  Subjective/Objective:    [place] to see whether Ms Veale is still an active client of [facility]. Was informed that home health service was terminated by [facility] on 05/12/15, and that the last home visit was 05/02/15.                Action/Plan:   Expected Discharge Date:                  Expected Discharge Plan:     In-House Referral:     Discharge planning Services     Post Acute Care Choice:    Choice offered to:     DME Arranged:    DME Agency:     HH Arranged:    [place] Agency:     Status of Service:     Medicare Important Message Given:    Date Medicare IM Given:    Medicare IM give by:    Date Additional Medicare IM Given:    Additional Medicare Important Message give by:     If discussed at [place] of Stay Meetings, dates discussed:    Additional Comments:  Rockett,Marilyn A, RN 05/22/2015, 1:00 PM

## 2015-05-22 NOTE — Consult Note (Signed)
[facility] Face-to-Face Follow Psychiatry Consult Note  Reason for Consult:  Depression Referring Physician:  Gladstone Lighter, MD  Patient Identification: Kristine Macias MRN:  [id number]   Principal Diagnosis: Failure to thrive in adult   Diagnosis:   Patient Active Problem List   Diagnosis Date Noted  . Chest pain [R07.9] 05/20/2015  . GAD (generalized anxiety disorder) [F41.1] 04/17/2015  . Panic disorder [F41.0] 04/17/2015  . Alcohol withdrawal [F10.239] 04/16/2015  . Abdominal pain, generalized [R10.84] 04/16/2015  . Vomiting and diarrhea [R11.10, R19.7]   . Failure to thrive in adult [R62.7]   . Intractable nausea and vomiting [R11.10] 03/01/2015  . Hypokalemia [E87.6] 03/01/2015  . Protein-calorie malnutrition, severe [E43] 01/05/2015  . Alcohol abuse [F10.10]   . Generalized anxiety disorder [F41.1] 11/26/2014    Class: Chronic  . Alcohol use disorder, severe, dependence [F10.20] 11/25/2014  . Adnexal mass [N94.9] 04/10/2012    Total Time spent with patient: 30 minutes  Subjective:   Kristine Macias is a 29 y.o. female patient admitted with severe protein calorie malnutrition has PEG tube feeding, Crohn's ileitis, alcohol use disorder, severe who was admitted to [facility] for primarily right sided stomach pain of several weeks' duration and 2 days of centrally located dull chest pain. Admission lipase was low at 16. Troponin on 05-21-15 was elevated at 0.04 which is trending down from 0.05 on 05-20-15. Hepatic function panel demonstrates elevated alkaline phosphatase, AST, ALT, total bilirubin, direct bilirubin and indirect bilirubin. Total protein and albumin are both decreased. D-Dimer is elevated at 1407. Serum creatinine is elevated at 0.42 on 05-20-15.  Urine pregnancy test is negative. UDS and Ethanol were not obtained on ED presentation.   05-22-2015 Today on interview, the pt states he is doing well. The pt shared her mood is fine but appeared to have a vacuous smile at  times. She does laugh appropriately at times during the interview. The pt is calm and cooperative. Dicussed the use of EMSAM as an antidepressant and due to the pt's h/o alcohol use disorder, severe with frequent relapses and the dietary restrictions necessary to use a MAOI, it was decided to not try EMSAM at this time. The pt and I discussed the risks outweighing the benefits.   Pt also shared her current treatment team is considering starting a medication to improve appetite which may lead to weight gain. The pt is hopeful with weight gain that her mood will improve.   In regards to alcohol use disorder, severe, the pt would like to participate in outpatient substance abuse treatment in [place] if possible. She wants to see a therapist and a psychiatrist to help manage these issues. Will discuss with SW to provide the pt resources. Also discussed ECT is possibly an option in the future once medically stable and if she continues to not tolerate antidepressants. Pt was most recently on Saphris which she had to discontinue based upon side effects. The majority of medications she has tried for depression and mood stabilization had to be discontinued due to medication side effects.   Pt denies SI, HI and AVH at this time.   HPI:  See above HPI Elements:   Quality:  stable. Severity:  severe. Timing:  the past several days. Duration:  many years. Context:  medical illnesses.  Past Medical History:  Past Medical History  Diagnosis Date  . Anxiety   . Chlamydia 2007  . Irregular heart beat 2010    wt/diet related after evaluation  . Renal disorder   .  Pneumothorax, spontaneous, tension   . Alcohol abuse   . Pancreas divisum   . Failure to thrive in adult     Past Surgical History  Procedure Laterality Date  . Cesarean section    . Chest tube insertion    . Esophagogastroduodenoscopy N/A 01/06/2015    Procedure: ESOPHAGOGASTRODUODENOSCOPY (EGD);  Surgeon: Josefine Class, MD;   Location[facility] ENDOSCOPY;  Service: Endoscopy;  Laterality: N/A;  . Colonoscopy with propofol N/A 01/06/2015    Procedure: COLONOSCOPY WITH PROPOFOL;  Surgeon: Josefine Class, MD;  Location: [facility] ENDOSCOPY;  Service: Endoscopy;  Laterality: N/A;  . Peg placement N/A 01/11/2015    Procedure: PERCUTANEOUS ENDOSCOPIC GASTROSTOMY (PEG) PLACEMENT;  Surgeon: Josefine Class, MD;  Location: [facility] ENDOSCOPY;  Service: Endoscopy;  Laterality: N/A;  . Esophagogastroduodenoscopy N/A 01/11/2015    Rein-normal with PEG placement   Family History:  Family History  Problem Relation Age of Onset  . Anesthesia problems Neg Hx   . Thyroid disease Mother   . Cancer Father   . Stroke Other   . Crohn's disease Brother    Social History:  History  Alcohol Use  . 21.6 oz/week  . 60 Cans of beer per week    Comment: 6 beers/day; trying to quit, participating in AA     History  Drug Use No    Social History   Social History  . Marital Status: Single    Spouse Name: N/A  . Number of Children: N/A  . Years of Education: N/A   Social History Main Topics  . Smoking status: Current Every Day Smoker -- 2.00 packs/day    Types: Cigarettes  . Smokeless tobacco: Never Used  . Alcohol Use: 21.6 oz/week    36 Cans of beer per week     Comment: 6 beers/day; trying to quit, participating in [place]  . Drug Use: No  . Sexual Activity: Yes    Birth Control/ Protection: None   Other Topics Concern  . None   Social History Narrative   Lives with mother & 2 children   disabled   Additional Social History: Has 2 sons.  Father is deceased. Currently separated from husband.   Allergies:   Allergies  Allergen Reactions  . Effexor [Venlafaxine] Anaphylaxis  . Sulfa Antibiotics Anaphylaxis  . Tetracyclines & Related Anaphylaxis  . Trazodone And Nefazodone Anaphylaxis  . Latex Hives, Itching and Rash  . Paxil [Paroxetine] Hives, Itching and Rash  . Seroquel [Quetiapine Fumarate] Hives, Itching and  Rash  . Zoloft [Sertraline Hcl] Hives, Itching and Rash    Labs:  Results for orders placed or performed during the hospital encounter of 05/20/15 (from the past 48 hour(s))  Troponin I     Status: None   Collection Time: 05/20/15  6:19 PM  Result Value Ref Range   Troponin I 0.03 <0.031 ng/mL    Comment:        NO INDICATION OF MYOCARDIAL INJURY.   Troponin I     Status: None   Collection Time: 05/21/15  2:02 AM  Result Value Ref Range   Troponin I 0.03 <0.031 ng/mL    Comment:        NO INDICATION OF MYOCARDIAL INJURY.   Troponin I     Status: Abnormal   Collection Time: 05/21/15 10:15 AM  Result Value Ref Range   Troponin I 0.04 (H) <0.031 ng/mL    Comment: READ BACK AND VERIFIED WITH JAN  PFLUMM AT 1113 05/21/15 SDR  PERSISTENTLY INCREASED TROPONIN VALUES IN THE RANGE OF 0.04-0.49 ng/mL CAN BE SEEN IN:       -UNSTABLE ANGINA       -CONGESTIVE HEART FAILURE       -MYOCARDITIS       -CHEST TRAUMA       -ARRYHTHMIAS       -LATE PRESENTING MYOCARDIAL INFARCTION       -COPD   CLINICAL FOLLOW-UP RECOMMENDED.   Magnesium     Status: None   Collection Time: 05/21/15 10:15 AM  Result Value Ref Range   Magnesium 1.7 1.7 - 2.4 mg/dL  Phosphorus     Status: None   Collection Time: 05/21/15 10:15 AM  Result Value Ref Range   Phosphorus 3.7 2.5 - 4.6 mg/dL  Hepatic function panel     Status: Abnormal   Collection Time: 05/21/15 10:15 AM  Result Value Ref Range   Total Protein 5.5 (L) 6.5 - 8.1 g/dL   Albumin 2.1 (L) 3.5 - 5.0 g/dL   AST 194 (H) 15 - 41 U/L   ALT 61 (H) 14 - 54 U/L   Alkaline Phosphatase 505 (H) 38 - 126 U/L   Total Bilirubin 6.6 (H) 0.3 - 1.2 mg/dL   Bilirubin, Direct 4.1 (H) 0.1 - 0.5 mg/dL   Indirect Bilirubin 2.5 (H) 0.3 - 0.9 mg/dL  Protime-INR     Status: None   Collection Time: 05/21/15  7:13 PM  Result Value Ref Range   Prothrombin Time 14.9 11.4 - 15.0 seconds   INR 7.12   Basic metabolic panel     Status: Abnormal   Collection  Time: 05/22/15  3:59 AM  Result Value Ref Range   Sodium 135 135 - 145 mmol/L   Potassium 3.6 3.5 - 5.1 mmol/L   Chloride 101 101 - 111 mmol/L   CO2 29 22 - 32 mmol/L   Glucose, Bld 140 (H) 65 - 99 mg/dL   BUN 6 6 - 20 mg/dL   Creatinine, Ser 0.31 (L) 0.44 - 1.00 mg/dL   Calcium 8.2 (L) 8.9 - 10.3 mg/dL   GFR calc non Af Amer >60 >60 mL/min   GFR calc Af Amer >60 >60 mL/min    Comment: (NOTE) The eGFR has been calculated using the CKD EPI equation. This calculation has not been validated in all clinical situations. eGFR's persistently <60 mL/min signify possible Chronic Kidney Disease.    Anion gap 5 5 - 15  Magnesium     Status: None   Collection Time: 05/22/15  3:59 AM  Result Value Ref Range   Magnesium 1.7 1.7 - 2.4 mg/dL  Hepatic function panel     Status: Abnormal   Collection Time: 05/22/15  3:59 AM  Result Value Ref Range   Total Protein 5.7 (L) 6.5 - 8.1 g/dL   Albumin 2.0 (L) 3.5 - 5.0 g/dL   AST 147 (H) 15 - 41 U/L   ALT 52 14 - 54 U/L   Alkaline Phosphatase 494 (H) 38 - 126 U/L   Total Bilirubin 5.9 (H) 0.3 - 1.2 mg/dL   Bilirubin, Direct 3.7 (H) 0.1 - 0.5 mg/dL   Indirect Bilirubin 2.2 (H) 0.3 - 0.9 mg/dL    Vitals: Blood pressure 92/58, pulse 79, temperature 98.1 F (36.7 C), temperature source Oral, resp. rate 18, height _0  (1.676 m), weight 34.473 kg (76 lb), SpO2 96 %.  Risk to Self: Is patient at risk for suicide?: No Risk to Others:   Prior Inpatient  Therapy:   Prior Outpatient Therapy:    Current Facility-Administered Medications  Medication Dose Route Frequency Provider Last Rate Last Dose  . aspirin chewable tablet 81 mg  81 mg Oral Daily Fritzi Mandes, MD   81 mg at 05/22/15 1228  . enoxaparin (LOVENOX) injection 30 mg  30 mg Subcutaneous Q24H Ramond Dial, RPH   30 mg at 05/21/15 2148  . feeding supplement (JEVITY 1.5 CAL/FIBER) liquid 120 mL  120 mL Per Tube QID Gladstone Lighter, MD   120 mL at 05/22/15 1000  . folic acid (FOLVITE) tablet  1 mg  1 mg Oral Daily Fritzi Mandes, MD   1 mg at 05/22/15 1228  . free water 200 mL  200 mL Per Tube 3 times per day Gladstone Lighter, MD   200 mL at 05/22/15 0600  . LORazepam (ATIVAN) tablet 1 mg  1 mg Oral Q6H PRN Fritzi Mandes, MD   1 mg at 05/22/15 1239  . magnesium oxide (MAG-OX) tablet 400 mg  400 mg Oral BID Fritzi Mandes, MD   400 mg at 05/22/15 1228  . megestrol (MEGACE) tablet 40 mg  40 mg Oral BID Gladstone Lighter, MD   40 mg at 05/22/15 1520  . multivitamin with minerals tablet 1 tablet  1 tablet Oral Daily Fritzi Mandes, MD   1 tablet at 05/22/15 1228  . pantoprazole (PROTONIX) EC tablet 40 mg  40 mg Oral BID Lollie Sails, MD   40 mg at 05/22/15 1227  . potassium chloride (K-DUR) CR tablet 10 mEq  10 mEq Oral BID Fritzi Mandes, MD   10 mEq at 05/22/15 1227  . thiamine (VITAMIN B-1) tablet 100 mg  100 mg Oral Daily Fritzi Mandes, MD   100 mg at 05/22/15 1228    Musculoskeletal: Strength & Muscle Tone: flaccid Gait & Station: not assessed due to patient weakness Patient leans: N/A  Psychiatric Specialty Exam: Physical Exam  Review of Systems  Constitutional: Positive for weight loss.  HENT: Negative.   Eyes: Negative.   Respiratory: Negative.   Cardiovascular: Negative.   Gastrointestinal: Positive for nausea and abdominal pain.  Genitourinary: Negative.   Musculoskeletal: Negative.   Skin: Negative.   Neurological: Negative.   Endo/Heme/Allergies: Negative.   Psychiatric/Behavioral: Positive for depression and substance abuse. Negative for suicidal ideas, hallucinations and memory loss. The patient is nervous/anxious. The patient does not have insomnia.     Blood pressure 92/58, pulse 79, temperature 98.1 F (36.7 C), temperature source Oral, resp. rate 18, height _0  (1.676 m), weight 34.473 kg (76 lb), SpO2 96 %.Body mass index is 12.27 kg/(m^2).  General Appearance: Casual and thin, gaunt  Eye Contact::  Good  Speech:  Clear and Coherent, Normal Rate and normal tone and  volume  Volume:  Normal  Mood:  Hopeless and sad  Affect:  Full Range  Thought Process:  Coherent, Goal Directed, Intact, Linear and Logical  Orientation:  Full (Time, Place, and Person)  Thought Content:  Rumination and worries frequently  Suicidal Thoughts:  No  Homicidal Thoughts:  No  Memory:  Immediate;   Good Recent;   Good Remote;   Good  Judgement:  Fair  Insight:  Good  Psychomotor Activity:  Decreased  Concentration:  Good  Recall:  Good  Fund of Knowledge:Good  Language: Good  Akathisia:  Negative  Handed:  Ambidextrous  AIMS (if indicated):     Assets:  Communication Skills Desire for Improvement Financial Resources/Insurance Housing Social Support Talents/Skills  ADL's:  Intact  Cognition: WNL  Sleep:  normal   Medical Decision Making: Review of Psycho-Social Stressors (1), Review or order clinical lab tests (1), Established Problem, Worsening (2), Review or order medicine tests (1), Independent Review of image, tracing or specimen (2) and Review of Medication Regimen & Side Effects (2)  Treatment Plan Summary: 1. 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   - Pt is hopeful to start a medication to improve appetite to which she is hopeful will improve mood.   2. Alcohol use disorder, severe  - Pt willing to participate in outpatient psychotherapy and psychiatric care in [place], [place] if possible. Pt is currently being seen in [place] which is somewhat far for her to travel.   3. Continue to monitor.   4. No further medication recommendations at this time.   Plan:  No evidence of imminent risk to self or others at present.   Patient does not meet criteria for psychiatric inpatient admission. Supportive therapy provided about ongoing  stressors. Discussed crisis plan, support from social network, calling 911, coming to the Emergency Department, and calling Suicide Hotline.   Disposition: Home when medically stable.  Donita Brooks 05/22/2015 4:42 PM

## 2015-05-22 NOTE — Progress Notes (Signed)
[place] [facility] [place] NAME: Kristine Macias    MR#:  [id number]  DATE OF BIRTH:  [birth year]  SUBJECTIVE:  CHIEF COMPLAINT:   Chief Complaint  Patient presents with  . Weakness   - Feeling much better. Started on a liquid diet yesterday. But able to tolerate regular diet well. -Requesting for some appetite stimulating medication. States Remeron gave her allergic reaction. -LFTs with minimal improvement  REVIEW OF SYSTEMS:  Review of Systems  Constitutional: Negative for fever and chills.  HENT: Negative for ear discharge, ear pain and nosebleeds.   Eyes: Negative for blurred vision and double vision.  Respiratory: Negative for cough, sputum production, shortness of breath and wheezing.   Cardiovascular: Negative for chest pain and palpitations.  Gastrointestinal: Negative for nausea, vomiting, abdominal pain, diarrhea and constipation.  Genitourinary: Negative for dysuria and frequency.  Musculoskeletal: Negative for myalgias and back pain.  Skin: Negative for rash.  Neurological: Positive for weakness. Negative for dizziness, sensory change, speech change, focal weakness, seizures and headaches.    DRUG ALLERGIES:   Allergies  Allergen Reactions  . Effexor [Venlafaxine] Anaphylaxis  . Sulfa Antibiotics Anaphylaxis  . Tetracyclines & Related Anaphylaxis  . Trazodone And Nefazodone Anaphylaxis  . Latex Hives, Itching and Rash  . Paxil [Paroxetine] Hives, Itching and Rash  . Seroquel [Quetiapine Fumarate] Hives, Itching and Rash  . Zoloft [Sertraline Hcl] Hives, Itching and Rash    VITALS:  Blood pressure 92/58, pulse 79, temperature 98.1 F (36.7 C), temperature source Oral, resp. rate 18, height 5' 6"  (1.676 m), weight 34.473 kg (76 lb), SpO2 96 %.  PHYSICAL EXAMINATION:  Physical Exam  GENERAL:  29 y.o.-year-old, thin appearing patient lying in the bed with no acute distress.  EYES: Pupils equal, round, reactive to  light and accommodation. No scleral icterus. Extraocular muscles intact.  HEENT: Head atraumatic, normocephalic. Oropharynx and nasopharynx clear.  NECK:  Supple, no jugular venous distention. No thyroid enlargement, no tenderness.  LUNGS: Normal breath sounds bilaterally, no wheezing, rales,rhonchi or crepitation. No use of accessory muscles of respiration.  CARDIOVASCULAR: S1, S2 normal. No murmurs, rubs, or gallops.  ABDOMEN: Soft, nontender, nondistended. Bowel sounds present. No organomegaly or mass. PEG tube in place. EXTREMITIES: No pedal edema, cyanosis, or clubbing.  NEUROLOGIC: Cranial nerves II through XII are intact. Muscle strength 5/5 in all extremities. Sensation intact. Gait not checked.  PSYCHIATRIC: The patient is alert and oriented x 3.  SKIN: No obvious rash, lesion, or ulcer.    LABORATORY PANEL:   CBC  Recent Labs Lab 05/20/15 1049  WBC 8.9  HGB 14.3  HCT 41.9  PLT 95*   ------------------------------------------------------------------------------------------------------------------  Chemistries   Recent Labs Lab 05/22/15 0359  NA 135  K 3.6  CL 101  CO2 29  GLUCOSE 140*  BUN 6  CREATININE 0.31*  CALCIUM 8.2*  MG 1.7  AST 147*  ALT 52  ALKPHOS 494*  BILITOT 5.9*   ------------------------------------------------------------------------------------------------------------------  Cardiac Enzymes  Recent Labs Lab 05/21/15 1015  TROPONINI 0.04*   ------------------------------------------------------------------------------------------------------------------  RADIOLOGY:  Dg Chest 2 View  05/20/2015   CLINICAL DATA:  Pt states that she has been diagnosed with colitis and a type of hepatitis, pt has a feeding tube, continues to vomit, weakness, productive cough, smoker.  EXAM: CHEST  2 VIEW  COMPARISON:  03/01/2015  FINDINGS: Lungs are hyperinflated. Heart size is normal. No focal consolidations or pleural effusions. No pulmonary edema.   IMPRESSION: No active  cardiopulmonary disease.   Electronically Signed   By: Nolon Nations M.D.   On: 05/20/2015 13:10   Ct Angio Chest Pe W/cm &/or Wo Cm  05/20/2015   CLINICAL DATA:  29 year old with cough and shortness of breath. Anterior chest pain for 48 hours. History of spontaneous pneumothoraces.  EXAM: CT ANGIOGRAPHY CHEST WITH CONTRAST  TECHNIQUE: Multidetector CT imaging of the chest was performed using the standard protocol during bolus administration of intravenous contrast. Multiplanar CT image reconstructions and MIPs were obtained to evaluate the vascular anatomy.  CONTRAST:  2m OMNIPAQUE IOHEXOL 350 MG/ML SOLN  COMPARISON:  Chest radiograph 05/20/2015  FINDINGS: Negative for pulmonary embolism. Normal appearance of the thoracic aorta and the great vessels are patent. Diffuse low-attenuation of the liver suggests diffuse hepatic steatosis. There is no significant pericardial or pleural fluid. No evidence for chest lymphadenopathy.  The trachea and mainstem bronchi are patent. Negative for pneumothorax. Minimal scarring at the lung apices. Few subtle densities in the lingula, best seen on sequence 8, image 108. No large areas of consolidation.  No acute bone abnormality.  Review of the MIP images confirms the above findings.  IMPRESSION: Negative for pulmonary embolism.  Subtle ground-glass densities in the lingula. Findings could represent a small focus of pneumonia.  Diffuse low density of the liver is suggestive for hepatic steatosis.   Electronically Signed   By: AMarkus DaftM.D.   On: 05/20/2015 16:49   UKoreaAbdomen Complete  05/22/2015   CLINICAL DATA:  Elevated LFTs. Right side abdominal pain for 2 weeks.  EXAM: ULTRASOUND ABDOMEN COMPLETE  COMPARISON:  Ultrasound 04/16/2015  FINDINGS: Gallbladder: Gallbladder remains distended as seen previously. Small amount of gallbladder sludge now present. Gallbladder wall mildly thickened at 4 mm. No visible stones. Negative sonographic Murphy's.   Common bile duct: Diameter: Upper limits normal in diameter at 6 mm  Liver: Mildly increased echotexture suggesting fatty infiltration. No focal abnormality.  IVC: No abnormality visualized.  Pancreas: Visualized portion unremarkable.  Spleen: Size and appearance within normal limits.  Right Kidney: Length: 11.1 cm. Echogenicity within normal limits. No mass or hydronephrosis visualized.  Left Kidney: Length: 11.7 cm. Echogenicity within normal limits. No mass or hydronephrosis visualized.  Abdominal aorta: No aneurysm visualized.  Other findings: None.  IMPRESSION: Mildly increased echotexture throughout the liver compatible with fatty infiltration.  Stable gallbladder distention. There is a small amount of gallbladder sludge now present with slight gallbladder wall thickening. No stones or sonographic Murphy's sign.   Electronically Signed   By: KRolm BaptiseM.D.   On: 05/22/2015 11:45    EKG:   Orders placed or performed during the hospital encounter of 05/20/15  . ED EKG within 10 minutes  . ED EKG within 10 minutes  . EKG 12-Lead  . EKG 12-Lead    ASSESSMENT AND PLAN:   29year old S[place]amber [place] past medical history of severe profound malnutrition status post PEG placement and PEG feeding, severe alcohol dependence, depression comes to the emergency room with chest pain.  1. Chest pain- ? Musculoskeletal chest pain Mildly elevated troponin, plateaued now Appears atypical chest pain. No cardiac history. EKG shows sinus tachycardia and no acute ST elevation or depression. Monitor for now and pain control  2. Severe alcohol dependence and abuse. Advised alcohol cessation. We'll continue CIWA protocol Psych consulted  3. Elevated bilirubin, LDH and chronic transaminitis Last ultrasound of the abdomen which showed diffuse hepatic steatosis no gallstones.  Bilirubin on admission is 6.0. Last bilirubin was  1.5  GI consulted No significant abd pain- not sure if from severe alc  abuse Liver ultrasound done showing significant changes of cirrhosis, gallbladder sludge noted. No obstructive stones noted. No CBD dilatation.  4. Depression Psych consult appreciated. No indication for psychiatric admission. No suicidal ideation  5. DVT prophylaxis subcutaneous Lovenox  6. Nutrition- requesting appetite stimulant. Couldn't tolerate Remeron in the past. Will start on Megace - Has a PEg tube- appreciate dietary consult- started on bolus feeds and also food by mouth as tolerated - Tolerating tube feeds well. Replace magnesium.   All the records are reviewed and case discussed with Care Management/Social Workerr. Management plans discussed with the patient, family and they are in agreement.  CODE STATUS: Full Code  TOTAL TIME TAKING CARE OF THIS PATIENT: 35 minutes.   POSSIBLE D/C IN 1-2 DAYS, DEPENDING ON CLINICAL CONDITION.   Gladstone Lighter M.D on 05/22/2015 at 12:41 PM  Between 7am to 6pm - Pager - [phone]  After 6pm go to [email or web address] - password [email or web address] [facility]  [facility]  Office  [phone]  CC: Primary care physician; Leonides Sake, MD

## 2015-05-22 NOTE — Progress Notes (Signed)
Nutrition Follow-up  DOCUMENTATION CODES:   Severe malnutrition in context of chronic illness  INTERVENTION:   EN: recommend continuing current TF plan, if continues to tolerate, will reassess for increasing TF regimen to 1 can (240 mL) at bolus feedings (especialy if po intake remains poor) Meals and Snacks: Cater to patient preferences  NUTRITION DIAGNOSIS:   Inadequate oral intake related to poor appetite, social / environmental circumstances as evidenced by percent weight loss, per patient/family report, severe depletion of body fat, severe depletion of muscle mass.  GOAL:   Patient will meet greater than or equal to 90% of their needs  MONITOR:    (Energy Intake, EN, Digestive System, Anthropometrics, Electrolyte/Renal Profile, Glucose Profile)  REASON FOR ASSESSMENT:   Consult Poor PO  ASSESSMENT:    Pt ras receiving ativan for anxiety, tolerated all scheduled bolus feedings thus far, no reported N/V  Diet Order:  Diet full liquid Room service appropriate?: Yes; Fluid consistency:: Thin   Energy Intake: pt tolerating sips and bites of meal trays  EN: pt tolerating Jevity 1.5 bolus feedings at 1/2 can  Digestive System: no residuals, no signs of TF intolerance  Last BM:  9/23   Meds: reviewed  Electrolyte and Renal Profile:  Recent Labs Lab 05/20/15 1049 05/21/15 1015 05/22/15 0359  BUN 8  --  6  CREATININE 0.42*  --  0.31*  NA 134*  --  135  K 3.5  --  3.6  MG  --  1.7 1.7  PHOS  --  3.7  --     Height:   Ht Readings from Last 1 Encounters:  05/20/15 5' 6"  (1.676 m)    Weight:   Wt Readings from Last 1 Encounters:  05/20/15 76 lb (34.473 kg)   Filed Weights   05/20/15 1041  Weight: 76 lb (34.473 kg)    BMI:  Body mass index is 12.27 kg/(m^2).  Estimated Nutritional Needs:   Kcal:  1323-1653 kcals (BEE 1272, 1.3 AF, 0.8-1.0 IF)   Protein:  42-53 g (0.8-1.0 g/kg)   Fluid:  1590-1855 mL (30-35 ml/kg)    HIGH Care Level  Kerman Passey MS, RD, LDN [phone] Pager

## 2015-05-22 NOTE — Plan of Care (Signed)
Problem: Phase I Progression Outcomes Goal: Other Phase I Outcomes/Goals Outcome: Progressing Pt is alert and oriented x 4, mother and children visiting at bedside, denies pain, c/o anxiety improved with ativan, mother assisted with tube feeding, patient ordered take-out from outside restaurant, up to bathroom with stand by assist, on room air, received IV magnesium, started on megace but did not want to take medication until the morning, LFT and bilirubin remain elevated, denies n/v, refuses aspirin.

## 2015-05-23 LAB — COMPREHENSIVE METABOLIC PANEL
ALT: 41 U/L (ref 14–54)
AST: 112 U/L — ABNORMAL HIGH (ref 15–41)
Albumin: 1.9 g/dL — ABNORMAL LOW (ref 3.5–5.0)
Alkaline Phosphatase: 369 U/L — ABNORMAL HIGH (ref 38–126)
Anion gap: 4 — ABNORMAL LOW (ref 5–15)
BUN: 8 mg/dL (ref 6–20)
CO2: 27 mmol/L (ref 22–32)
Calcium: 7.9 mg/dL — ABNORMAL LOW (ref 8.9–10.3)
Chloride: 107 mmol/L (ref 101–111)
Creatinine, Ser: 0.38 mg/dL — ABNORMAL LOW (ref 0.44–1.00)
GFR calc Af Amer: >60 mL/min (ref >60)
GFR calc non Af Amer: >60 mL/min (ref >60)
Glucose, Bld: 117 mg/dL — ABNORMAL HIGH (ref 65–99)
Potassium: 3.9 mmol/L (ref 3.5–5.1)
Sodium: 138 mmol/L (ref 135–145)
Total Bilirubin: 4.1 mg/dL — ABNORMAL HIGH (ref 0.3–1.2)
Total Protein: 5.6 g/dL — ABNORMAL LOW (ref 6.5–8.1)

## 2015-05-23 LAB — CERULOPLASMIN: Ceruloplasmin: 20.9 mg/dL (ref 19.0–39.0)

## 2015-05-23 MED ORDER — FREE WATER: 200.0000 mL | Freq: Three times a day (TID) | Status: DC

## 2015-05-23 NOTE — Care Management Note (Signed)
Case Management Note  Patient Details  Name: Kristine Macias MRN: [id number] Date of Birth: Oct 28, 1985  Subjective/Objective:      29yo Kristine Macias was admitted  05/20/15 per c/o chest pain and was found to have an elevated bilirubin level. PCP=Dr Maura Hamrick. Pharmacy=[facility]. Reports that her Mother assists her with Jevity PEG feedings at home. Reports no home equipment and states that she does not need any. Discharging home today with a home health referral for skilled nursing called to [facility] per Kristine Bevans's choice of home health provider. 2A charge nurse Manuela Schwartz was asked to assist with obtaining connection tubing for PEG because her current one is not functioning per Kristine Edwina Barth.              Action/Plan:   Expected Discharge Date:                  Expected Discharge Plan:     In-House Referral:     Discharge planning Services     Post Acute Care Choice:    Choice offered to:     DME Arranged:    DME Agency:     HH Arranged:    [place] Agency:     Status of Service:     Medicare Important Message Given:    Date Medicare IM Given:    Medicare IM give by:    Date Additional Medicare IM Given:    Additional Medicare Important Message give by:     If discussed at [place] of Stay Meetings, dates discussed:    Additional Comments:  Rockett,Marilyn A, RN 05/23/2015, 8:17 AM

## 2015-05-23 NOTE — Discharge Summary (Signed)
[facility] [place] NAME: Kristine Macias    MR#:  [id number]  DATE OF BIRTH:  [birth year]  DATE OF ADMISSION:  05/20/2015 ADMITTING PHYSICIAN: Fritzi Mandes, MD  DATE OF DISCHARGE: 05/23/2015  PRIMARY CARE PHYSICIAN: Leonides Sake, MD    ADMISSION DIAGNOSIS:  Elevated troponin [R79.89] Elevated bilirubin [R17] Abdominal pain, unspecified abdominal location [R10.9]  DISCHARGE DIAGNOSIS:  Principal Problem:   Failure to thrive in adult Active Problems:   Alcohol use disorder, severe, dependence   GAD (generalized anxiety disorder)   Panic disorder   SECONDARY DIAGNOSIS:   Past Medical History  Diagnosis Date  . Anxiety   . Chlamydia 2007  . Irregular heart beat 2010    wt/diet related after evaluation  . Renal disorder   . Pneumothorax, spontaneous, tension   . Alcohol abuse   . Pancreas divisum   . Failure to thrive in adult     HOSPITAL COURSE:   29 year old Kristine Macias with past medical history of severe profound malnutrition status post PEG placement and PEG feeding, severe alcohol dependence, depression comes to the emergency room with chest pain.  1. Chest pain-  Musculoskeletal chest pain Mildly elevated troponin, plateaued now Appears atypical chest pain. No cardiac history. EKG shows sinus tachycardia and no acute ST elevation or depression. Resolved now  2. Severe alcohol dependence and abuse. Advised alcohol cessation. Was on CIWA protocol- no active withdrawals Psych consulted  3. Elevated bilirubin, LDH and chronic transaminitis Last ultrasound of the abdomen which showed diffuse hepatic steatosis no gallstones.  Bilirubin on admission elevated, likely acute alcohol ingestion- improving now - repeat US with no CBD obstruction or gall stones GI consulted  4. Depression Psych consult appreciated. No indication for psychiatric admission. No suicidal ideation Outpatient f/u  recommended  5. Nutrition- refused appetite stimulant. As tolerating food by mouth well.  - Couldn't tolerate Remeron in the past.  - Has a PEg tube- appreciate dietary consult- started on bolus feeds and also food by mouth as tolerated - Tolerating tube feeds well. Will be discharged on bolus feeds as well.  Discharge today  DISCHARGE CONDITIONS:   Stable  CONSULTS OBTAINED:  Treatment Team:  Donita Brooks, MD Lollie Sails, MD  DRUG ALLERGIES:   Allergies  Allergen Reactions  . Aspirin Shortness Of Breath  . Effexor [Venlafaxine] Anaphylaxis  . Sulfa Antibiotics Anaphylaxis  . Tetracyclines & Related Anaphylaxis  . Trazodone And Nefazodone Anaphylaxis  . Latex Hives, Itching and Rash  . Paxil [Paroxetine] Hives, Itching and Rash  . Seroquel [Quetiapine Fumarate] Hives, Itching and Rash  . Zoloft [Sertraline Hcl] Hives, Itching and Rash    DISCHARGE MEDICATIONS:   Current Discharge Medication List    START taking these medications   Details  Water For Irrigation, Sterile (FREE WATER) SOLN Place 200 mLs into feeding tube every 8 (eight) hours. Qty: 1000 mL, Refills: 5      CONTINUE these medications which have NOT CHANGED   Details  folic acid (FOLVITE) 1 MG tablet Take 1 tablet (1 mg total) by mouth daily. Qty: 30 tablet, Refills: 0    LORazepam (ATIVAN) 1 MG tablet Take 1 tablet (1 mg total) by mouth every 6 (six) hours as needed for anxiety. Qty: 32 tablet, Refills: 0    Magnesium Oxide 400 (240 MG) MG TABS Take 400 mg by mouth 2 (two) times daily. Qty: 60 tablet, Refills: 0    Multiple Vitamin (MULTIVITAMIN WITH MINERALS)  TABS tablet Take 1 tablet by mouth daily.    Nutritional Supplements (FEEDING SUPPLEMENT, JEVITY 1.5 CAL/FIBER,) LIQD Place 240 mLs into feeding tube 4 (four) times daily. Qty: 28800 mL, Refills: 1    potassium chloride (K-DUR) 10 MEQ tablet Take 1 tablet (10 mEq total) by mouth 2 (two) times daily. Qty: 60 tablet, Refills: 0     thiamine 100 MG tablet Take 1 tablet (100 mg total) by mouth daily. Qty: 7 tablet, Refills: 0      STOP taking these medications     cephALEXin (KEFLEX) 500 MG capsule      omeprazole (PRILOSEC) 20 MG capsule          DISCHARGE INSTRUCTIONS:   1. Psychiatry f/u in 1 week 2. GI f/u in 1-2 weeks 3. PCP f/u in 2 weeks 4. Abstinence from alcohol  If you experience worsening of your admission symptoms, develop shortness of breath, life threatening emergency, suicidal or homicidal thoughts you must seek medical attention immediately by calling 911 or calling your MD immediately  if symptoms less severe.  You Must read complete instructions/literature along with all the possible adverse reactions/side effects for all the Medicines you take and that have been prescribed to you. Take any new Medicines after you have completely understood and accept all the possible adverse reactions/side effects.   Please note  You were cared for by a hospitalist during your hospital stay. If you have any questions about your discharge medications or the care you received while you were in the hospital after you are discharged, you can call the unit and asked to speak with the hospitalist on call if the hospitalist that took care of you is not available. Once you are discharged, your primary care physician will handle any further medical issues. Please note that NO REFILLS for any discharge medications will be authorized once you are discharged, as it is imperative that you return to your primary care physician (or establish a relationship with a primary care physician if you do not have one) for your aftercare needs so that they can reassess your need for medications and monitor your lab values.    Today   CHIEF COMPLAINT:   Chief Complaint  Patient presents with  . Weakness    VITAL SIGNS:  Blood pressure 91/64, pulse 82, temperature 97.8 F (36.6 C), temperature source Oral, resp. rate 14,  height 5' 6"  (1.676 m), weight 34.473 kg (76 lb), SpO2 98 %.  I/O:   Intake/Output Summary (Last 24 hours) at 05/23/15 1035 Last data filed at 05/23/15 0958  Gross per 24 hour  Intake    890 ml  Output      0 ml  Net    890 ml    PHYSICAL EXAMINATION:   Physical Exam  GENERAL: 29 y.o.-year-old, thin appearing patient lying in the bed with no acute distress.  EYES: Pupils equal, round, reactive to light and accommodation. No scleral icterus. Extraocular muscles intact.  HEENT: Head atraumatic, normocephalic. Oropharynx and nasopharynx clear.  NECK: Supple, no jugular venous distention. No thyroid enlargement, no tenderness.  LUNGS: Normal breath sounds bilaterally, no wheezing, rales,rhonchi or crepitation. No use of accessory muscles of respiration.  CARDIOVASCULAR: S1, S2 normal. No murmurs, rubs, or gallops.  ABDOMEN: Soft, nontender, nondistended. Bowel sounds present. No organomegaly or mass. PEG tube in place. EXTREMITIES: No pedal edema, cyanosis, or clubbing.  NEUROLOGIC: Cranial nerves II through XII are intact. Muscle strength 5/5 in all extremities. Sensation intact. Gait not  checked.  PSYCHIATRIC: The patient is alert and oriented x 3.  SKIN: No obvious rash, lesion, or ulcer.   DATA REVIEW:   CBC  Recent Labs Lab 05/20/15 1049  WBC 8.9  HGB 14.3  HCT 41.9  PLT 95*    Chemistries   Recent Labs Lab 05/22/15 0359 05/23/15 0359  NA 135 138  K 3.6 3.9  CL 101 107  CO2 29 27  GLUCOSE 140* 117*  BUN 6 8  CREATININE 0.31* 0.38*  CALCIUM 8.2* 7.9*  MG 1.7  --   AST 147* 112*  ALT 52 41  ALKPHOS 494* 369*  BILITOT 5.9* 4.1*    Cardiac Enzymes  Recent Labs Lab 05/21/15 1015  TROPONINI 0.04*    Microbiology Results  Results for orders placed or performed during the hospital encounter of 03/01/15  Stool culture     Status: None   Collection Time: 03/02/15  2:23 PM  Result Value Ref Range Status   Specimen Description STOOL  Final    Special Requests NONE  Final   Culture   Final    NO SALMONELLA OR SHIGELLA ISOLATED No Pathogenic E. coli detected NO CAMPYLOBACTER DETECTED    Report Status 03/04/2015 FINAL  Final  C difficile quick scan w PCR reflex ([facility] only)     Status: None   Collection Time: 03/02/15  2:23 PM  Result Value Ref Range Status   C Diff antigen NEGATIVE  Final   C Diff toxin NEGATIVE  Final   C Diff interpretation Negative for C. difficile  Final  Ova and parasite examination     Status: None   Collection Time: 03/02/15  6:06 PM  Result Value Ref Range Status   Specimen Description STOOL  Final   Special Requests NONE  Final   Ova and parasites SEE [id number]  Final   Report Status 03/03/2015 FINAL  Final    RADIOLOGY:  US Abdomen Complete  05/22/2015   CLINICAL DATA:  Elevated LFTs. Right side abdominal pain for 2 weeks.  EXAM: ULTRASOUND ABDOMEN COMPLETE  COMPARISON:  Ultrasound 04/16/2015  FINDINGS: Gallbladder: Gallbladder remains distended as seen previously. Small amount of gallbladder sludge now present. Gallbladder wall mildly thickened at 4 mm. No visible stones. Negative sonographic Murphy's.  Common bile duct: Diameter: Upper limits normal in diameter at 6 mm  Liver: Mildly increased echotexture suggesting fatty infiltration. No focal abnormality.  IVC: No abnormality visualized.  Pancreas: Visualized portion unremarkable.  Spleen: Size and appearance within normal limits.  Right Kidney: Length: 11.1 cm. Echogenicity within normal limits. No mass or hydronephrosis visualized.  Left Kidney: Length: 11.7 cm. Echogenicity within normal limits. No mass or hydronephrosis visualized.  Abdominal aorta: No aneurysm visualized.  Other findings: None.  IMPRESSION: Mildly increased echotexture throughout the liver compatible with fatty infiltration.  Stable gallbladder distention. There is a small amount of gallbladder sludge now present with slight gallbladder wall thickening. No stones or sonographic  Murphy's sign.   Electronically Signed   By: Rolm Baptise M.D.   On: 05/22/2015 11:45    EKG:   Orders placed or performed during the hospital encounter of 05/20/15  . ED EKG within 10 minutes  . ED EKG within 10 minutes  . EKG 12-Lead  . EKG 12-Lead      Management plans discussed with the patient, family and they are in agreement.  CODE STATUS:     Code Status Orders        Start  Ordered   05/20/15 1957  Full code   Continuous     05/20/15 1956      TOTAL TIME TAKING CARE OF THIS PATIENT: 36 minutes.    Gladstone Lighter M.D on 05/23/2015 at 10:35 AM  Between 7am to 6pm - Pager - [phone]  After 6pm go to [email or web address] - password [email or web address] [facility]  [facility]  Office  [phone]  CC: Primary care physician; [place]ides Sake, MD

## 2015-05-23 NOTE — Progress Notes (Signed)
Pt refusing tube feeding at this time.

## 2015-05-23 NOTE — Progress Notes (Signed)
Pt discharged to home via wc.  Instructions and rx given to pt.  Questions answered.  No distress.  

## 2015-06-21 ENCOUNTER — Other Ambulatory Visit: Payer: Self-pay

## 2015-06-21 ENCOUNTER — Encounter: Payer: Self-pay | Admitting: *Deleted

## 2015-06-21 ENCOUNTER — Inpatient Hospital Stay
Admission: EM | Admit: 2015-06-21 | Discharge: 2015-06-22 | DRG: 896 | Disposition: A | Discharge status: Home / Self Care | Payer: Medicaid Other | Attending: Internal Medicine | Admitting: Internal Medicine

## 2015-06-21 ENCOUNTER — Emergency Department: Payer: Medicaid Other

## 2015-06-21 DIAGNOSIS — Z91128 Patient's intentional underdosing of medication regimen for other reason: Secondary | ICD-10-CM | POA: Diagnosis not present

## 2015-06-21 DIAGNOSIS — E43 Unspecified severe protein-calorie malnutrition: Secondary | ICD-10-CM | POA: Diagnosis present

## 2015-06-21 DIAGNOSIS — F10980 Alcohol use, unspecified with alcohol-induced anxiety disorder: Secondary | ICD-10-CM | POA: Diagnosis present

## 2015-06-21 DIAGNOSIS — F13939 Sedative, hypnotic or anxiolytic use, unspecified with withdrawal, unspecified: Secondary | ICD-10-CM

## 2015-06-21 DIAGNOSIS — Z79899 Other long term (current) drug therapy: Secondary | ICD-10-CM | POA: Diagnosis not present

## 2015-06-21 DIAGNOSIS — F10939 Alcohol use, unspecified with withdrawal, unspecified: Secondary | ICD-10-CM

## 2015-06-21 DIAGNOSIS — E876 Hypokalemia: Secondary | ICD-10-CM | POA: Diagnosis present

## 2015-06-21 DIAGNOSIS — K5090 Crohn's disease, unspecified, without complications: Secondary | ICD-10-CM | POA: Diagnosis present

## 2015-06-21 DIAGNOSIS — Z931 Gastrostomy status: Secondary | ICD-10-CM | POA: Diagnosis not present

## 2015-06-21 DIAGNOSIS — F19239 Other psychoactive substance dependence with withdrawal, unspecified: Secondary | ICD-10-CM | POA: Diagnosis not present

## 2015-06-21 DIAGNOSIS — F419 Anxiety disorder, unspecified: Secondary | ICD-10-CM | POA: Diagnosis present

## 2015-06-21 DIAGNOSIS — K7010 Alcoholic hepatitis without ascites: Secondary | ICD-10-CM | POA: Diagnosis present

## 2015-06-21 DIAGNOSIS — F1010 Alcohol abuse, uncomplicated: Secondary | ICD-10-CM | POA: Diagnosis present

## 2015-06-21 DIAGNOSIS — F13239 Sedative, hypnotic or anxiolytic dependence with withdrawal, unspecified: Principal | ICD-10-CM | POA: Diagnosis present

## 2015-06-21 DIAGNOSIS — R569 Unspecified convulsions: Secondary | ICD-10-CM | POA: Diagnosis not present

## 2015-06-21 DIAGNOSIS — Z681 Body mass index (BMI) 19 or less, adult: Secondary | ICD-10-CM | POA: Diagnosis not present

## 2015-06-21 DIAGNOSIS — F17210 Nicotine dependence, cigarettes, uncomplicated: Secondary | ICD-10-CM | POA: Diagnosis present

## 2015-06-21 DIAGNOSIS — F10239 Alcohol dependence with withdrawal, unspecified: Secondary | ICD-10-CM

## 2015-06-21 DIAGNOSIS — T424X6A Underdosing of benzodiazepines, initial encounter: Secondary | ICD-10-CM | POA: Diagnosis present

## 2015-06-21 LAB — CBC WITH DIFFERENTIAL/PLATELET
BASOS ABS: 0.1 10*3/uL (ref 0–0.1)
BASOS PCT: 1 %
Eosinophils Absolute: 0 10*3/uL (ref 0–0.7)
Eosinophils Relative: 1 %
HEMATOCRIT: 43 % (ref 35.0–47.0)
HEMOGLOBIN: 14.8 g/dL (ref 12.0–16.0)
LYMPHS PCT: 25 %
Lymphs Abs: 1.8 10*3/uL (ref 1.0–3.6)
MCH: 34.8 pg — ABNORMAL HIGH (ref 26.0–34.0)
MCHC: 34.3 g/dL (ref 32.0–36.0)
MCV: 101.6 fL — AB (ref 80.0–100.0)
MONO ABS: 0.5 10*3/uL (ref 0.2–0.9)
Monocytes Relative: 6 %
NEUTROS ABS: 4.9 10*3/uL (ref 1.4–6.5)
NEUTROS PCT: 67 %
Platelets: 202 10*3/uL (ref 150–440)
RBC: 4.23 MIL/uL (ref 3.80–5.20)
RDW: 14.2 % (ref 11.5–14.5)
WBC: 7.2 10*3/uL (ref 3.6–11.0)

## 2015-06-21 LAB — C DIFFICILE QUICK SCREEN W PCR REFLEX
C DIFFICLE (CDIFF) ANTIGEN: NEGATIVE
C Diff interpretation: NEGATIVE
C Diff toxin: NEGATIVE

## 2015-06-21 LAB — COMPREHENSIVE METABOLIC PANEL
ALBUMIN: 3.6 g/dL (ref 3.5–5.0)
ALK PHOS: 172 U/L — AB (ref 38–126)
ALT: 147 U/L — AB (ref 14–54)
AST: 696 U/L — AB (ref 15–41)
Anion gap: 13 (ref 5–15)
BILIRUBIN TOTAL: 1.3 mg/dL — AB (ref 0.3–1.2)
BUN: 18 mg/dL (ref 6–20)
CO2: 28 mmol/L (ref 22–32)
CREATININE: 0.6 mg/dL (ref 0.44–1.00)
Calcium: 8 mg/dL — ABNORMAL LOW (ref 8.9–10.3)
Chloride: 96 mmol/L — ABNORMAL LOW (ref 101–111)
GFR calc Af Amer: >60 mL/min (ref >60)
GLUCOSE: 118 mg/dL — AB (ref 65–99)
Potassium: 3.4 mmol/L — ABNORMAL LOW (ref 3.5–5.1)
Sodium: 137 mmol/L (ref 135–145)
TOTAL PROTEIN: 8 g/dL (ref 6.5–8.1)

## 2015-06-21 LAB — URINALYSIS COMPLETE WITH MICROSCOPIC (ARMC ONLY)
BACTERIA UA: NONE SEEN
Bilirubin Urine: NEGATIVE
Glucose, UA: NEGATIVE mg/dL
Ketones, ur: NEGATIVE mg/dL
LEUKOCYTES UA: NEGATIVE
Nitrite: NEGATIVE
PH: 7 (ref 5.0–8.0)
PROTEIN: 100 mg/dL — AB
Specific Gravity, Urine: 1.028 (ref 1.005–1.030)

## 2015-06-21 LAB — URINE DRUG SCREEN, QUALITATIVE (ARMC ONLY)
Amphetamines, Ur Screen: NOT DETECTED
BARBITURATES, UR SCREEN: NOT DETECTED
BENZODIAZEPINE, UR SCRN: POSITIVE — AB
Cannabinoid 50 Ng, Ur ~~LOC~~: NOT DETECTED
Cocaine Metabolite,Ur ~~LOC~~: NOT DETECTED
MDMA (Ecstasy)Ur Screen: NOT DETECTED
METHADONE SCREEN, URINE: NOT DETECTED
OPIATE, UR SCREEN: NOT DETECTED
Phencyclidine (PCP) Ur S: NOT DETECTED
TRICYCLIC, UR SCREEN: NOT DETECTED

## 2015-06-21 LAB — POCT PREGNANCY, URINE: Preg Test, Ur: NEGATIVE

## 2015-06-21 LAB — PROTIME-INR
INR: 1.29
PROTHROMBIN TIME: 16.3 s — AB (ref 11.4–15.0)

## 2015-06-21 LAB — APTT: APTT: 35 s (ref 24–36)

## 2015-06-21 LAB — ETHANOL: Alcohol, Ethyl (B): 136 mg/dL — ABNORMAL HIGH (ref <5)

## 2015-06-21 LAB — SALICYLATE LEVEL: Salicylate Lvl: <4 mg/dL (ref 2.8–30.0)

## 2015-06-21 LAB — ACETAMINOPHEN LEVEL: Acetaminophen (Tylenol), Serum: <10 ug/mL — ABNORMAL LOW (ref 10–30)

## 2015-06-21 LAB — TROPONIN I

## 2015-06-21 MED ORDER — LORAZEPAM 2 MG/ML IJ SOLN
INTRAMUSCULAR | Status: AC
  Administered 2015-06-21: 1 mg via INTRAVENOUS
  Filled 2015-06-21: qty 1

## 2015-06-21 MED ORDER — NICOTINE 21 MG/24HR TD PT24: 21.0000 mg | MEDICATED_PATCH | Freq: Every day | TRANSDERMAL | Status: DC

## 2015-06-21 MED ORDER — ADULT MULTIVITAMIN W/MINERALS CH
1.0000 | ORAL_TABLET | Freq: Every day | ORAL | Status: DC
Start: 2015-06-21 — End: 2015-06-22
  Administered 2015-06-21 – 2015-06-22 (×2): 1 via ORAL
  Filled 2015-06-21 (×2): qty 1

## 2015-06-21 MED ORDER — SODIUM CHLORIDE 0.9 % IV SOLN
INTRAVENOUS | Status: DC
  Administered 2015-06-21 – 2015-06-22 (×2): via INTRAVENOUS

## 2015-06-21 MED ORDER — PANTOPRAZOLE SODIUM 40 MG PO TBEC
40.0000 mg | DELAYED_RELEASE_TABLET | Freq: Every day | ORAL | Status: DC
  Administered 2015-06-21 – 2015-06-22 (×2): 40 mg via ORAL
  Filled 2015-06-21 (×2): qty 1

## 2015-06-21 MED ORDER — VITAMIN B-1 100 MG PO TABS: 100.0000 mg | ORAL_TABLET | Freq: Every day | ORAL | Status: DC

## 2015-06-21 MED ORDER — LORAZEPAM 2 MG/ML IJ SOLN
1.0000 mg | Freq: Once | INTRAMUSCULAR | Status: AC
  Administered 2015-06-21: 1 mg via INTRAVENOUS
  Filled 2015-06-21: qty 1

## 2015-06-21 MED ORDER — FOLIC ACID 1 MG PO TABS
1.0000 mg | ORAL_TABLET | Freq: Every day | ORAL | Status: DC
  Administered 2015-06-21 – 2015-06-22 (×2): 1 mg via ORAL
  Filled 2015-06-21 (×2): qty 1

## 2015-06-21 MED ORDER — POTASSIUM CHLORIDE ER 10 MEQ PO TBCR
10.0000 meq | EXTENDED_RELEASE_TABLET | Freq: Two times a day (BID) | ORAL | Status: DC
  Administered 2015-06-21 – 2015-06-22 (×3): 10 meq via ORAL
  Filled 2015-06-21 (×7): qty 1

## 2015-06-21 MED ORDER — LORAZEPAM 2 MG/ML IJ SOLN
2.0000 mg | INTRAMUSCULAR | Status: DC | PRN
  Administered 2015-06-21 – 2015-06-22 (×4): 2 mg via INTRAVENOUS
  Filled 2015-06-21 (×5): qty 1

## 2015-06-21 MED ORDER — SODIUM CHLORIDE 0.9 % IV BOLUS (SEPSIS)
1000.0000 mL | Freq: Once | INTRAVENOUS | Status: AC
  Administered 2015-06-21: 1000 mL via INTRAVENOUS

## 2015-06-21 MED ORDER — ONDANSETRON HCL 4 MG PO TABS
4.0000 mg | ORAL_TABLET | Freq: Four times a day (QID) | ORAL | Status: DC | PRN
  Administered 2015-06-21: 4 mg via ORAL
  Filled 2015-06-21: qty 1

## 2015-06-21 MED ORDER — VITAMIN B-1 100 MG PO TABS
100.0000 mg | ORAL_TABLET | Freq: Every day | ORAL | Status: DC
  Administered 2015-06-22: 100 mg via ORAL
  Filled 2015-06-21 (×2): qty 1

## 2015-06-21 MED ORDER — FREE WATER
200.0000 mL | Freq: Three times a day (TID) | Status: DC
  Filled 2015-06-21 (×3): qty 200

## 2015-06-21 MED ORDER — JEVITY 1.5 CAL/FIBER PO LIQD: 240.0000 mL | Freq: Four times a day (QID) | ORAL | Status: DC

## 2015-06-21 MED ORDER — LORAZEPAM 2 MG/ML IJ SOLN
1.0000 mg | Freq: Once | INTRAMUSCULAR | Status: AC
  Administered 2015-06-21: 1 mg via INTRAVENOUS

## 2015-06-21 MED ORDER — ENOXAPARIN SODIUM 40 MG/0.4ML ~~LOC~~ SOLN
40.0000 mg | SUBCUTANEOUS | Status: DC
  Administered 2015-06-21: 40 mg via SUBCUTANEOUS
  Filled 2015-06-21 (×2): qty 0.4

## 2015-06-21 MED ORDER — THIAMINE HCL 100 MG/ML IJ SOLN
100.0000 mg | Freq: Every day | INTRAMUSCULAR | Status: DC
  Administered 2015-06-21: 100 mg via INTRAVENOUS
  Filled 2015-06-21: qty 2

## 2015-06-21 MED ORDER — MAGNESIUM OXIDE 400 (241.3 MG) MG PO TABS
400.0000 mg | ORAL_TABLET | Freq: Two times a day (BID) | ORAL | Status: DC
Start: 2015-06-21 — End: 2015-06-22
  Administered 2015-06-22: 400 mg via ORAL
  Filled 2015-06-21 (×4): qty 1

## 2015-06-21 NOTE — ED Notes (Signed)
POC urine pregnancy completed. Result: Negative.

## 2015-06-21 NOTE — ED Notes (Signed)
CIWA scale reassessed and noted to be 12 at this time. Pt has visible tremors and complains of fullness in head. Dr. Edd Fabian aware and verbal order for 1 mg of Ativan received.

## 2015-06-21 NOTE — H&P (Signed)
[place] [facility] [place] NAME: Kristine Macias    MR#:  [id number]  DATE OF BIRTH:  03-03-1986  DATE OF ADMISSION:  06/21/2015  PRIMARY CARE PHYSICIAN: [place]ides Sake, MD   REQUESTING/REFERRING PHYSICIAN: Dr. Loura Pardon  CHIEF COMPLAINT:   Chief Complaint  Patient presents with  . Seizures    HISTORY OF PRESENT ILLNESS:  Kristine Macias  is a 29 y.o. female with a known history of Crohn's disease and is post U for severe protein calorie malnutrition, history of anxiety on Ativan, history of alcohol abuse comes in with 6 episodes of seizures this morning witnessed by the mother. Patient says that she is not taking the Ativan for the past 2 days. Last dose was Sunday. Patient usually takes 1 mg of Ativan up to 6 mg per day but recently trying to wean her off Ativan herself, trying to decrease the dose by about 0.5 mg dose up to 6 mg per day for the last dose was Sunday. Previously she had history of seizures with Ativan withdrawal but this time she had 6 seizures. She did not have any seizures by EMS or in the emergency room. Patient is a heavy drinker and she is trying to quit but restarted and she had about 3 cans of 12 ounce each.very anxious, tremulous. Awake alert oriented able to talk full Sentences, no hypoxia.    status post surgery,PAST MEDICAL HISTORY:   Past Medical History  Diagnosis Date  . Anxiety   . Chlamydia 2007  . Irregular heart beat 2010    wt/diet related after evaluation  . Renal disorder   . Pneumothorax, spontaneous, tension   . Alcohol abuse   . Pancreas divisum   . Failure to thrive in adult     PAST SURGICAL HISTOIRY:   Past Surgical History  Procedure Laterality Date  . Cesarean section    . Chest tube insertion    . Esophagogastroduodenoscopy N/A 01/06/2015    Procedure: ESOPHAGOGASTRODUODENOSCOPY (EGD);  Surgeon: Josefine Class, MD;  Location: [facility] ENDOSCOPY;  Service: Endoscopy;   Laterality: N/A;  . Colonoscopy with propofol N/A 01/06/2015    Procedure: COLONOSCOPY WITH PROPOFOL;  Surgeon: Josefine Class, MD;  Location: [facility] ENDOSCOPY;  Service: Endoscopy;  Laterality: N/A;  . Peg placement N/A 01/11/2015    Procedure: PERCUTANEOUS ENDOSCOPIC GASTROSTOMY (PEG) PLACEMENT;  Surgeon: Josefine Class, MD;  Location: [facility] ENDOSCOPY;  Service: Endoscopy;  Laterality: N/A;  . Esophagogastroduodenoscopy N/A 01/11/2015    Rein-normal with PEG placement    SOCIAL HISTORY:   Social History  Substance Use Topics  . Smoking status: Current Every Day Smoker -- 2.00 packs/day    Types: Cigarettes  . Smokeless tobacco: Never Used  . Alcohol Use: 21.6 oz/week    36 Cans of beer per week     Comment: 6 beers/day; trying to quit, participating in [place]:   Family History  Problem Relation Age of Onset  . Anesthesia problems Neg Hx   . Thyroid disease Mother   . Cancer Father   . Stroke Other   . Crohn's disease Brother     DRUG ALLERGIES:   Allergies  Allergen Reactions  . Aspirin Shortness Of Breath  . Effexor [Venlafaxine] Anaphylaxis  . Sulfa Antibiotics Anaphylaxis  . Tetracyclines & Related Anaphylaxis  . Trazodone And Nefazodone Anaphylaxis  . Latex Hives, Itching and Rash  . Paxil [Paroxetine] Hives, Itching and Rash  . Seroquel [  Quetiapine Fumarate] Hives, Itching and Rash  . Zoloft [Sertraline Hcl] Hives, Itching and Rash    REVIEW OF SYSTEMS:  CONSTITUTIONAL: No fever, fatigue or weakness.  EYES: No blurred or double vision.  EARS, NOSE, AND THROAT: No tinnitus or ear pain.  RESPIRATORY: No cough, shortness of breath, wheezing or hemoptysis.  CARDIOVASCULAR: No chest pain, orthopnea, edema.  GASTROINTESTINAL: No nausea, vomiting, diarrhea or abdominal pain.  GENITOURINARY: No dysuria, hematuria.  ENDOCRINE: No polyuria, nocturia,  HEMATOLOGY: No anemia, easy bruising or bleeding SKIN: No rash or lesion. MUSCULOSKELETAL: No  joint pain or arthritis.   NEUROLOGIC: No tingling, numbness, weakness.  PSYCHIATRY: anxious.  MEDICATIONS AT HOME:   Prior to Admission medications   Medication Sig Start Date End Date Taking? Authorizing Provider  folic acid (FOLVITE) 1 MG tablet Take 1 tablet (1 mg total) by mouth daily. 03/03/15  Yes Demetrios Loll, MD  LORazepam (ATIVAN) 1 MG tablet Take 1 tablet (1 mg total) by mouth every 6 (six) hours as needed for anxiety. 04/18/15  Yes Loletha Grayer, MD  Magnesium Oxide 400 (240 MG) MG TABS Take 400 mg by mouth 2 (two) times daily. 04/18/15  Yes Loletha Grayer, MD  Multiple Vitamin (MULTIVITAMIN WITH MINERALS) TABS tablet Take 1 tablet by mouth daily.   Yes Historical Provider, MD  omeprazole (PRILOSEC) 20 MG capsule Take 20 mg by mouth daily.   Yes Historical Provider, MD  ondansetron (ZOFRAN) 4 MG tablet Take 4 mg by mouth every 6 (six) hours as needed for nausea or vomiting.    Yes Historical Provider, MD  potassium chloride (K-DUR) 10 MEQ tablet Take 1 tablet (10 mEq total) by mouth 2 (two) times daily. 04/18/15  Yes Loletha Grayer, MD  thiamine 100 MG tablet Take 1 tablet (100 mg total) by mouth daily. 03/03/15  Yes Demetrios Loll, MD  Nutritional Supplements (FEEDING SUPPLEMENT, JEVITY 1.5 CAL/FIBER,) LIQD Place 240 mLs into feeding tube 4 (four) times daily. Patient not taking: Reported on 06/21/2015 04/18/15   Loletha Grayer, MD  Water For Irrigation, Sterile (FREE WATER) SOLN Place 200 mLs into feeding tube every 8 (eight) hours. Patient not taking: Reported on 06/21/2015 05/23/15   Gladstone Lighter, MD      VITAL SIGNS:  Blood pressure 135/106, pulse 105, temperature 98.3 F (36.8 C), temperature source Oral, resp. rate 17, height 5' 6"  (1.676 m), weight 39.917 kg (88 lb), SpO2 97 %.  PHYSICAL EXAMINATION:  GENERAL:  29 y.o.-year-old patient lying in the bed with no acute distress.  EYES: Pupils equal, round, reactive to light and accommodation. No scleral icterus. Extraocular  muscles intact.  HEENT: Head atraumatic, normocephalic. Oropharynx and nasopharynx clear.  NECK:  Supple, no jugular venous distention. No thyroid enlargement, no tenderness.  LUNGS: Normal breath sounds bilaterally, no wheezing, rales,rhonchi or crepitation. No use of accessory muscles of respiration.  CARDIOVASCULAR: S1, S2 normal. No murmurs, rubs, or gallops.  ABDOMEN: Soft, nontender, nondistended. Bowel sounds present. No organomegaly or mass.  EXTREMITIES: No pedal edema, cyanosis, or clubbing.  NEUROLOGIC: Cranial nerves II through XII are intact. Muscle strength 5/5 in all extremities. Sensation intact. Gait not checked.  PSYCHIATRIC: The patient is alert and oriented x 3. . Anxious tremulous .SKIN: No obvious rash, lesion, or ulcer.   LABORATORY PANEL:   CBC  Recent Labs Lab 06/21/15 0938  WBC 7.2  HGB 14.8  HCT 43.0  PLT 202   ------------------------------------------------------------------------------------------------------------------  Chemistries   Recent Labs Lab 06/21/15 0938  NA 137  K  3.4*  CL 96*  CO2 28  GLUCOSE 118*  BUN 18  CREATININE 0.60  CALCIUM 8.0*  AST 696*  ALT 147*  ALKPHOS 172*  BILITOT 1.3*   ------------------------------------------------------------------------------------------------------------------  Cardiac Enzymes  Recent Labs Lab 06/21/15 0938  TROPONINI <0.03   ------------------------------------------------------------------------------------------------------------------  RADIOLOGY:  Dg Chest Portable 1 View  06/21/2015  CLINICAL DATA:  Chest pain and seizures EXAM: PORTABLE CHEST 1 VIEW COMPARISON:  05/20/2015 FINDINGS: Normal heart size and mediastinal contours. No acute infiltrate or edema. No effusion or pneumothorax. No acute osseous findings. IMPRESSION: Negative portable chest. Electronically Signed   By: Monte Fantasia M.D.   On: 06/21/2015 10:24    EKG:   Orders placed or performed during the  hospital encounter of 06/21/15  . ED EKG  . ED EKG    IMPRESSION AND PLAN:  #1 benzodiazepine withdrawal seizures: Continue Ativan 1 mg IV every 4 hours follow seizure precautions, obtain neurology consult #2 Heavy alcohol abuse patient was trying to quit but restarted  Over the weekend; Continue CIWA protocol. 3. Alcoholic Hepatitis: Watch closely,advised to quit closely  Protein calorie malnutrition: History of Crohn's disease; has G-tube continue Jevity, continue 2500-calorie diet. #5 hypokalemia replace the potassium. 46 heavy tobacco abuse: Counseled again  Smoking, for 10 minutes she is in the process of using  E.cigars.  All the records are reviewed and case discussed with ED provider. Management plans discussed with the patient, family and they are in agreement.  CODE STATUS: full  TOTAL TIME TAKING CARE OF THIS PATIENT:55 minutes.    Epifanio Lesches M.D on 06/21/2015 at 1:34 PM  Between 7am to 6pm - Pager - [phone]  After 6pm go to [email or web address] - password [email or web address] [facility]  Office  [phone]  CC: Primary care physician; [place]ides Sake, MD  Note: This dictation was prepared with Dragon dictation along with smaller phrase technology. Any transcriptional errors that result from this process are unintentional.

## 2015-06-21 NOTE — Consult Note (Signed)
CC: seizures   HPI: Kristine Macias is an 29 y.o. female  with a known history of Crohn's disease history of anxiety on Ativan, history of alcohol abuse comes in with 6 episodes of seizures this morning witnessed by the mother. Patient says that she is not taking the Ativan for the past 2 days. Last dose was Sunday. Patient usually takes 1 mg of Ativan up to 6 mg per day but recently trying to wean her off Ativan herself, trying to decrease the dose by about 0.5 mg dose up to 6 mg per day for the last dose was Sunday.   Past Medical History  Diagnosis Date  . Anxiety   . Chlamydia 2007  . Irregular heart beat 2010    wt/diet related after evaluation  . Renal disorder   . Pneumothorax, spontaneous, tension   . Alcohol abuse   . Pancreas divisum   . Failure to thrive in adult     Past Surgical History  Procedure Laterality Date  . Cesarean section    . Chest tube insertion    . Esophagogastroduodenoscopy N/A 01/06/2015    Procedure: ESOPHAGOGASTRODUODENOSCOPY (EGD);  Surgeon: Josefine Class, MD;  Location: [facility] ENDOSCOPY;  Service: Endoscopy;  Laterality: N/A;  . Colonoscopy with propofol N/A 01/06/2015    Procedure: COLONOSCOPY WITH PROPOFOL;  Surgeon: Josefine Class, MD;  Location: [facility] ENDOSCOPY;  Service: Endoscopy;  Laterality: N/A;  . Peg placement N/A 01/11/2015    Procedure: PERCUTANEOUS ENDOSCOPIC GASTROSTOMY (PEG) PLACEMENT;  Surgeon: Josefine Class, MD;  Location: [facility] ENDOSCOPY;  Service: Endoscopy;  Laterality: N/A;  . Esophagogastroduodenoscopy N/A 01/11/2015    Rein-normal with PEG placement    Family History  Problem Relation Age of Onset  . Anesthesia problems Neg Hx   . Thyroid disease Mother   . Cancer Father   . Stroke Other   . Crohn's disease Brother     Social History:  reports that she has been smoking Cigarettes.  She has been smoking about 2.00 packs per day. She has never used smokeless tobacco. She reports that she drinks about 21.6  oz of alcohol per week. She reports that she does not use illicit drugs.  Allergies  Allergen Reactions  . Aspirin Shortness Of Breath  . Effexor [Venlafaxine] Anaphylaxis  . Sulfa Antibiotics Anaphylaxis  . Tetracyclines & Related Anaphylaxis  . Trazodone And Nefazodone Anaphylaxis  . Latex Hives, Itching and Rash  . Paxil [Paroxetine] Hives, Itching and Rash  . Seroquel [Quetiapine Fumarate] Hives, Itching and Rash  . Zoloft [Sertraline Hcl] Hives, Itching and Rash    Medications: I have reviewed the patient's current medications.  ROS: History obtained from the patient  General ROS: negative for - chills, fatigue, fever, night sweats, weight gain or weight loss Psychological ROS: positive for anxiety Ophthalmic ROS: negative for - blurry vision, double vision, eye pain or loss of vision ENT ROS: negative for - epistaxis, nasal discharge, oral lesions, sore throat, tinnitus or vertigo Allergy and Immunology ROS: negative for - hives or itchy/watery eyes Hematological and Lymphatic ROS: negative for - bleeding problems, bruising or swollen lymph nodes Endocrine ROS: negative for - galactorrhea, hair pattern changes, polydipsia/polyuria or temperature intolerance Respiratory ROS: negative for - cough, hemoptysis, shortness of breath or wheezing Cardiovascular ROS: negative for - chest pain, dyspnea on exertion, edema or irregular heartbeat Gastrointestinal ROS: negative for - abdominal pain, diarrhea, hematemesis, nausea/vomiting or stool incontinence Genito-Urinary ROS: negative for - dysuria, hematuria, incontinence or urinary frequency/urgency  Musculoskeletal ROS: negative for - joint swelling or muscular weakness Neurological ROS: as noted in HPI Dermatological ROS: negative for rash and skin lesion changes  Physical Examination: Blood pressure 123/99, pulse 104, temperature 98.9 F (37.2 C), temperature source Oral, resp. rate 16, height 5' 6"  (1.676 m), weight 84 lb 8 oz  (38.329 kg), SpO2 98 %.    Neurological Examination Mental Status: Alert, oriented, thought content appropriate.  Speech fluent without evidence of aphasia.  Able to follow 3 step commands without difficulty. Cranial Nerves: II: Discs flat bilaterally; Visual fields grossly normal, pupils equal, round, reactive to light and accommodation III,IV, VI: ptosis not present, extra-ocular motions intact bilaterally V,VII: smile symmetric, facial light touch sensation normal bilaterally VIII: hearing normal bilaterally IX,X: gag reflex present XI: bilateral shoulder shrug XII: midline tongue extension Motor: Right : Upper extremity   5/5    Left:     Upper extremity   5/5  Lower extremity   5/5     Lower extremity   5/5 Tone and bulk:normal tone throughout; no atrophy noted Sensory: Pinprick and light touch intact throughout, bilaterally Deep Tendon Reflexes: 1+ and symmetric throughout Plantars: Right: downgoing   Left: downgoing Cerebellar: normal finger-to-nose, normal rapid alternating movements and normal heel-to-shin test Gait: normal gait and station      Laboratory Studies:   Basic Metabolic Panel:  Recent Labs Lab 06/21/15 0938  NA 137  K 3.4*  CL 96*  CO2 28  GLUCOSE 118*  BUN 18  CREATININE 0.60  CALCIUM 8.0*    Liver Function Tests:  Recent Labs Lab 06/21/15 0938  AST 696*  ALT 147*  ALKPHOS 172*  BILITOT 1.3*  PROT 8.0  ALBUMIN 3.6   No results for input(s): LIPASE, AMYLASE in the last 168 hours. No results for input(s): AMMONIA in the last 168 hours.  CBC:  Recent Labs Lab 06/21/15 0938  WBC 7.2  NEUTROABS 4.9  HGB 14.8  HCT 43.0  MCV 101.6*  PLT 202    Cardiac Enzymes:  Recent Labs Lab 06/21/15 0938  TROPONINI <0.03    BNP: Invalid input(s): POCBNP  CBG: No results for input(s): GLUCAP in the last 168 hours.  Microbiology: Results for orders placed or performed during the hospital encounter of 06/21/15  C difficile quick  scan w PCR reflex     Status: None   Collection Time: 06/21/15  5:48 PM  Result Value Ref Range Status   C Diff antigen NEGATIVE NEGATIVE Final   C Diff toxin NEGATIVE NEGATIVE Final   C Diff interpretation Negative for C. difficile  Final    Coagulation Studies:  Recent Labs  06/21/15 0938  LABPROT 16.3*  INR 1.29    Urinalysis:  Recent Labs Lab 06/21/15 1142  COLORURINE YELLOW*  LABSPEC 1.028  PHURINE 7.0  GLUCOSEU NEGATIVE  HGBUR 2+*  BILIRUBINUR NEGATIVE  KETONESUR NEGATIVE  PROTEINUR 100*  NITRITE NEGATIVE  LEUKOCYTESUR NEGATIVE    Lipid Panel:     Component Value Date/Time   CHOL 81 07/19/2014 0356   TRIG 64 07/19/2014 0356   HDL 47 07/19/2014 0356   VLDL 13 07/19/2014 0356   LDLCALC 21 07/19/2014 0356    HgbA1C: No results found for: HGBA1C  Urine Drug Screen:     Component Value Date/Time   LABOPIA NONE DETECTED 06/21/2015 1142   LABOPIA NONE DETECTED 01/13/2015 2136   COCAINSCRNUR NONE DETECTED 01/13/2015 2136   LABBENZ POSITIVE* 06/21/2015 1142   LABBENZ POSITIVE* 01/13/2015 2136  AMPHETMU NONE DETECTED 06/21/2015 1142   AMPHETMU NONE DETECTED 01/13/2015 2136   THCU NONE DETECTED 06/21/2015 1142   THCU NONE DETECTED 01/13/2015 2136   LABBARB NONE DETECTED 06/21/2015 1142   LABBARB NONE DETECTED 01/13/2015 2136    Alcohol Level:  Recent Labs Lab 06/21/15 0938  ETH 136*    Other results: EKG: normal EKG, normal sinus rhythm, unchanged from previous tracings.  Imaging: Dg Chest Portable 1 View  06/21/2015  CLINICAL DATA:  Chest pain and seizures EXAM: PORTABLE CHEST 1 VIEW COMPARISON:  05/20/2015 FINDINGS: Normal heart size and mediastinal contours. No acute infiltrate or edema. No effusion or pneumothorax. No acute osseous findings. IMPRESSION: Negative portable chest. Electronically Signed   By: Monte Fantasia M.D.   On: 06/21/2015 10:24     Assessment/Plan:  29 y.o. female  with a known history of Crohn's disease history of  anxiety on Ativan, history of alcohol abuse comes in with 6 episodes of seizures this morning witnessed by the mother. Patient says that she is not taking the Ativan for the past 2 days. Last dose was Sunday. Patient usually takes 1 mg of Ativan up to 6 mg per day but recently trying to wean her off Ativan herself, trying to decrease the dose by about 0.5 mg dose up to 6 mg per day for the last dose was Sunday.   Ativan withdrawal seizures Back to baseline now Would not start any anti epileptic medications Slow taper of ativan No need for EEG or MRI  No driving, swimming alone or any baths  Kristine Macias  06/21/2015, 9:47 PM

## 2015-06-21 NOTE — ED Notes (Signed)
After ativan given pt lying in bed, not shaking or hyperventilating, respirations even and unlabored, pt awake and alert

## 2015-06-21 NOTE — Progress Notes (Signed)
Pt. Has been having loose, mucousy stool since admission to 2A. RN paged Dr. Vianne Bulls to see if r/o c.diff protocol could be initiated. MD agreed and gave verbal order to initiate precautions and obtain stool sample.

## 2015-06-21 NOTE — ED Provider Notes (Addendum)
[facility] Emergency Department Provider Note  ____________________________________________  Time seen: Approximately 9:50 AM  I have reviewed the triage vital signs and the nursing notes.   HISTORY  Chief Complaint Seizures    HPI Kristine Macias is a 29 y.o. female history of anxiety, panic disorder treated with oral Ativan, alcohol abuse disorder, failure to thrive as an adult with G-tube in place who presents for evaluation of seizures today. Patient reports that she has had approximately 6 generalized tonic-clonic seizures today. Some of these were witnessed by her mother. She reports that she has been trying to self wean her Ativan and reports that she has had seizures in the past in this setting. She has also not had any alcohol to drink in over 2 days. Symptoms began suddenly this morning and have been intermittent. She feels very anxious and is complaining of tightness in her chest. Ativan typically helps symptoms and there are no other modifying factors. She has had sore throat which was treated last week with antibiotics. No fevers, no diarrhea. She has had nonbloody nonbilious emesis today.   Past Medical History  Diagnosis Date  . Anxiety   . Chlamydia 2007  . Irregular heart beat 2010    wt/diet related after evaluation  . Renal disorder   . Pneumothorax, spontaneous, tension   . Alcohol abuse   . Pancreas divisum   . Failure to thrive in adult     Patient Active Problem List   Diagnosis Date Noted  . Chest pain 05/20/2015  . GAD (generalized anxiety disorder) 04/17/2015  . Panic disorder 04/17/2015  . Alcohol withdrawal ([place]) 04/16/2015  . Abdominal pain, generalized 04/16/2015  . Vomiting and diarrhea   . Failure to thrive in adult   . Intractable nausea and vomiting 03/01/2015  . Hypokalemia 03/01/2015  . Protein-calorie malnutrition, severe ([place]) 01/05/2015  . Alcohol abuse   . Generalized anxiety disorder 11/26/2014     Class: Chronic  . Alcohol use disorder, severe, dependence ([place]) 11/25/2014  . Adnexal mass 04/10/2012    Past Surgical History  Procedure Laterality Date  . Cesarean section    . Chest tube insertion    . Esophagogastroduodenoscopy N/A 01/06/2015    Procedure: ESOPHAGOGASTRODUODENOSCOPY (EGD);  Surgeon: Josefine Class, MD;  Location: [facility] ENDOSCOPY;  Service: Endoscopy;  Laterality: N/A;  . Colonoscopy with propofol N/A 01/06/2015    Procedure: COLONOSCOPY WITH PROPOFOL;  Surgeon: Josefine Class, MD;  Location: [facility] ENDOSCOPY;  Service: Endoscopy;  Laterality: N/A;  . Peg placement N/A 01/11/2015    Procedure: PERCUTANEOUS ENDOSCOPIC GASTROSTOMY (PEG) PLACEMENT;  Surgeon: Josefine Class, MD;  Location: [facility] ENDOSCOPY;  Service: Endoscopy;  Laterality: N/A;  . Esophagogastroduodenoscopy N/A 01/11/2015    Rein-normal with PEG placement    Current Outpatient Rx  Name  Route  Sig  Dispense  Refill  . folic acid (FOLVITE) 1 MG tablet   Oral   Take 1 tablet (1 mg total) by mouth daily.   30 tablet   0   . LORazepam (ATIVAN) 1 MG tablet   Oral   Take 1 tablet (1 mg total) by mouth every 6 (six) hours as needed for anxiety.   32 tablet   0   . Magnesium Oxide 400 (240 MG) MG TABS   Oral   Take 400 mg by mouth 2 (two) times daily.   60 tablet   0   . Multiple Vitamin (MULTIVITAMIN WITH MINERALS) TABS tablet   Oral  Take 1 tablet by mouth daily.         . Nutritional Supplements (FEEDING SUPPLEMENT, JEVITY 1.5 CAL/FIBER,) LIQD   Per Tube   Place 240 mLs into feeding tube 4 (four) times daily.   28800 mL   1   . potassium chloride (K-DUR) 10 MEQ tablet   Oral   Take 1 tablet (10 mEq total) by mouth 2 (two) times daily.   60 tablet   0   . thiamine 100 MG tablet   Oral   Take 1 tablet (100 mg total) by mouth daily.   7 tablet   0   . Water For Irrigation, Sterile (FREE WATER) SOLN   Per Tube   Place 200 mLs into feeding tube every 8 (eight) hours.    1000 mL   5     Allergies Aspirin; Effexor; Sulfa antibiotics; Tetracyclines & related; Trazodone and nefazodone; Latex; Paxil; Seroquel; and Zoloft  Family History  Problem Relation Age of Onset  . Anesthesia problems Neg Hx   . Thyroid disease Mother   . Cancer Father   . Stroke Other   . Crohn's disease Brother     Social History Social History  Substance Use Topics  . Smoking status: Current Every Day Smoker -- 2.00 packs/day    Types: Cigarettes  . Smokeless tobacco: Never Used  . Alcohol Use: 21.6 oz/week    36 Cans of beer per week     Comment: 6 beers/day; trying to quit, participating in AA    Review of Systems Constitutional: No fever/chills Eyes: No visual changes. ENT: No sore throat. Cardiovascular: Denies chest pain. Respiratory: Denies shortness of breath. Gastrointestinal: No abdominal pain.  + nausea, + vomiting.  No diarrhea.  No constipation. Genitourinary: Negative for dysuria. Musculoskeletal: Negative for back pain. Skin: Negative for rash. Neurological: Negative for headaches, focal weakness or numbness.  10-point ROS otherwise negative.  ____________________________________________   PHYSICAL EXAM:  VITAL SIGNS: ED Triage Vitals  Enc Vitals Group     BP 06/21/15 0933 134/103 mmHg     Pulse Rate 06/21/15 0933 125     Resp 06/21/15 0933 24     Temp 06/21/15 0933 98.3 F (36.8 C)     Temp Source 06/21/15 0933 Oral     SpO2 06/21/15 0933 98 %     Weight 06/21/15 0933 88 lb (39.917 kg)     Height 06/21/15 0933 5' 6"  (1.676 m)     Head Cir --      Peak Flow --      Pain Score 06/21/15 0935 6     Pain Loc --      Pain Edu? --      Excl. in [place]? --     Constitutional: Alert and oriented x 4. Thin/cachectic appearing, tremulous and hyperventilating, anxious-appearing. Eyes: Conjunctivae are normal. PERRL. EOMI. Head: Atraumatic. Nose: No congestion/rhinnorhea. Mouth/Throat: Mucous membranes are dry.  Oropharynx  non-erythematous. Neck: No stridor.  Cardiovascular: tachycardic rate, regular rhythm. Grossly normal heart sounds.  Good peripheral circulation. Respiratory: Normal respiratory effort.  No retractions. +tachypnea. Gastrointestinal: Soft and nontender. No distention. No CVA tenderness. G-tube in the mid abdomen. Genitourinary: deferred Musculoskeletal: No lower extremity tenderness nor edema.  No joint effusions. Neurologic:  Normal speech and language. 5 out of 5 strength in bilateral upper and lower extremities. Sensation intact to light touch throughout. Skin:  Skin is warm, dry and intact. No rash noted. Psychiatric: Mood is anxious, affect normal but the patient  is tearful at times.  ____________________________________________   LABS (all labs ordered are listed, but only abnormal results are displayed)  Labs Reviewed  CBC WITH DIFFERENTIAL/PLATELET  COMPREHENSIVE METABOLIC PANEL  TROPONIN I  ETHANOL  ACETAMINOPHEN LEVEL  SALICYLATE LEVEL  URINALYSIS COMPLETEWITH MICROSCOPIC ([place])  URINE DRUG SCREEN, QUALITATIVE ([place])  POC URINE PREG, ED   ____________________________________________  EKG  ED ECG REPORT I, Joanne Gavel, the attending physician, personally viewed and interpreted this ECG.   Date: 06/21/2015  EKG Time: 09:56  Rate: 89  Rhythm: normal sinus rhythm  Axis: normal  Intervals:none  ST&T Change: No acute ST elevation.  ____________________________________________  RADIOLOGY  CXR IMPRESSION: Negative portable chest.  ____________________________________________   PROCEDURES  Procedure(s) performed: None  Critical Care performed: No  ____________________________________________   INITIAL IMPRESSION / ASSESSMENT AND PLAN / ED COURSE  Pertinent labs & imaging results that were available during my care of the patient were reviewed by me and considered in my medical decision making (see chart for details).  Yahaira Bruski  is a 29 y.o. female history of anxiety, panic disorder treated with oral Ativan, alcohol abuse disorder, failure to thrive as an adult with G-tube in place who presents for evaluation of seizures today. On exam, she is a she is appearing but also tremulous, tachycardic, tachypnea and I suspect acute combine alcohol and benzodiazepine withdrawal.  awake, alert, oriented with an intact neurological exam. This may be secondary to a post ictal/Todd's paralysis, we'll continue to monitor. She received IV Ativan with immediate improvement of her symptoms. She is on CIWA protocol, we'll continue with fluids and Ativan as needed. Plan for screening labs, chest x-ray, urinalysis, urine drug screen and likely admission.  ----------------------------------------- 12:05 PM on 06/21/2015 -----------------------------------------  Patient was CIWA score 13 on arrival, improved immediately with Ativan IV. Labs reviewed. Notable for elevated alcohol level at 136. CBC unremarkable. CMP notable for transaminitis with dramatic elevation of AST. Troponin negative. Chest x-ray unremarkable. Case discussed with hospitalist for admission at this time.Continue liberal ativan. No seizure since arrival to ER. ____________________________________________   FINAL CLINICAL IMPRESSION(S) / ED DIAGNOSES  Final diagnoses:  Seizure ([place])  Benzodiazepine withdrawal with complication ([place])  Alcohol withdrawal seizure with complication, with unspecified complication ([place])      Joanne Gavel, MD 06/21/15 [place] Biviana Saddler, MD 06/21/15 1210

## 2015-06-21 NOTE — ED Notes (Signed)
Lab notified of add on labs. Lab states they have a blue top in lab and can complete PT-INR and PTT at this time.

## 2015-06-21 NOTE — ED Notes (Addendum)
Pt arrives via EMS for seizures, pt takes ativan for hx of anxiety and has been out of ativan for 2 days, EMs states 6 seizures today and vomiting, pt also admits some ETOH abuse, pt awake and speaking at this time, MD at bedside, pt has gtube in place for feeding due to chrones disease and weight loss

## 2015-06-21 NOTE — Progress Notes (Signed)
RN unable to find connecting device for patients button peg tube. RN called ENDO with no success finding appropriate supplies. Asked patient if her family could bring her supplies from home. Patient agreeable to this and will contact family.

## 2015-06-22 LAB — BASIC METABOLIC PANEL
ANION GAP: 7 (ref 5–15)
BUN: 17 mg/dL (ref 6–20)
CALCIUM: 7.7 mg/dL — AB (ref 8.9–10.3)
CHLORIDE: 101 mmol/L (ref 101–111)
CO2: 27 mmol/L (ref 22–32)
CREATININE: 0.57 mg/dL (ref 0.44–1.00)
GFR calc Af Amer: >60 mL/min (ref >60)
GFR calc non Af Amer: >60 mL/min (ref >60)
GLUCOSE: 76 mg/dL (ref 65–99)
Potassium: 3 mmol/L — ABNORMAL LOW (ref 3.5–5.1)
Sodium: 135 mmol/L (ref 135–145)

## 2015-06-22 LAB — CBC
HCT: 34.9 % — ABNORMAL LOW (ref 35.0–47.0)
HEMOGLOBIN: 12.2 g/dL (ref 12.0–16.0)
MCH: 36.2 pg — AB (ref 26.0–34.0)
MCHC: 34.9 g/dL (ref 32.0–36.0)
MCV: 103.7 fL — AB (ref 80.0–100.0)
Platelets: 115 10*3/uL — ABNORMAL LOW (ref 150–440)
RBC: 3.36 MIL/uL — ABNORMAL LOW (ref 3.80–5.20)
RDW: 14.1 % (ref 11.5–14.5)
WBC: 6 10*3/uL (ref 3.6–11.0)

## 2015-06-22 NOTE — Care Management (Signed)
[facility] physician has approved Kristine Macias.  Informed patient of agency due to our [facility] program and she verbalized agreement .  Faxed information to [place].  Have not received call back from Izora Ribas with [facility].  Sent her a text that referral has been faxed.

## 2015-06-22 NOTE — Clinical Social Work Note (Signed)
Clinical Social Work Assessment  Patient Details  Name: Kristine Macias MRN: [id number] Date of Birth: July 26, 1986  Date of referral:  06/22/15               Reason for consult:  Substance Use/ETOH Abuse                Permission sought to share information with:    Permission granted to share information::  No  Name::        Agency::     Relationship::     Contact Information:     Housing/Transportation Living arrangements for the past 2 months:  Single Family Home Source of Information:  Patient Patient Interpreter Needed:  None Criminal Activity/Legal Involvement Pertinent to Current Situation/Hospitalization:  No - Comment as needed Significant Relationships:  Dependent Children, Parents Lives with:  Minor Children, Parents Do you feel safe going back to the place where you live?  Yes Need for family participation in patient care:  No (Coment)  Care giving concerns:  Patient experiencing drug withdrawal    Social Worker assessment / plan:  Patient currently lives with her mother and her two minor children 7 and 5.  Per patient she continues to attend her AA meeting once to three times per week.  States her last drink was a month ago though her Ethol level at admission was 136. When confronted, patient continues to state  her last drink was a month ago.  CSW reviewed outpatient treatment options with patient.  She states she goes to [facility] but willing to take the resources CSW had.  Patient is ready to discharge home, informed CSW she  will continue to follow up with her AA meetings and [facility].   Employment status:  Unemployed Forensic scientist:  Medicaid In [place] PT Recommendations:  Not assessed at this time Information / Referral to community resources:  Other (Comment Required) (outpatient substance and mental health referral)  Patient/Family's Response to care:  Patient is appreciative of information, states she is ready to  discharge home, informed CSW she  will continue to follow up with her AA meetings and [facility].  Patient/Family's Understanding of and Emotional Response to Diagnosis, Current Treatment, and Prognosis:  Patient understands she is medically cleared to discharge and will follow up with outpatient treatment.  Emotional Assessment Appearance:  Appears older than stated age Attitude/Demeanor/Rapport:    Affect (typically observed):  Adaptable, Anxious, Overwhelmed, Pleasant Orientation:  Oriented to Self, Oriented to Place, Oriented to  Time, Oriented to Situation Alcohol / Substance use:  Alcohol Use, Tobacco Use Psych involvement (Current and /or in the community):  Outpatient Provider ([place])  Discharge Needs  Concerns to be addressed:  Substance Abuse Concerns, Coping/Stress Concerns Readmission within the last 30 days:  No Current discharge risk:  Substance Abuse, Chronically ill Barriers to Discharge:  No Barriers Identified   Maurine Cane, LCSW 06/22/2015, 12:22 PM

## 2015-06-22 NOTE — Progress Notes (Signed)
A & O. Received ativan twice for active shakes. Takes meds ok. IV and tele removed. Discharge instructions reviewed with the pt. Mom to take pt home. Pt has no further concerns at this time.

## 2015-06-22 NOTE — Clinical Documentation Improvement (Signed)
Internal Medicine  Can the diagnosis of Protein Calorie Malnutrition be further specified?Thank you     Document Severity - Severe(third degree), Moderate (second degree), Mild (first degree)  Other condition  Unable to clinically determine         Please exercise your independent, professional judgment when responding. A specific answer is not anticipated or expected.   Thank You, [place] [phone]

## 2015-06-22 NOTE — Care Management (Signed)
Patient admitted with ativan withdrawal seizures.  She has been trying to wean herself from Ativan.   She was recently discharged from [facility] 10/14.  Patient is at high risk for readmission.  She is agreeable to have home health nursing.  Sent update to [facility] physician to request [place].   [facility] is in  on rotation this week for [place]. Have contacted agency to determine if will accept patient under [place].  Attending to enter order for home health nursing and face to face.

## 2015-06-22 NOTE — Discharge Instructions (Signed)
Resume your last dose of Xanax and then follow up with your Primary Care provider for a slower taper regimen.   DIET:  Regular diet  DISCHARGE CONDITION:  Fair  ACTIVITY:  Activity as tolerated  OXYGEN:  Home Oxygen: No.   Oxygen Delivery: room air  DISCHARGE LOCATION:  home   If you experience worsening of your admission symptoms, develop shortness of breath, life threatening emergency, suicidal or homicidal thoughts you must seek medical attention immediately by calling 911 or calling your MD immediately  if symptoms less severe.  You Must read complete instructions/literature along with all the possible adverse reactions/side effects for all the Medicines you take and that have been prescribed to you. Take any new Medicines after you have completely understood and accpet all the possible adverse reactions/side effects.   Please note  You were cared for by a hospitalist during your hospital stay. If you have any questions about your discharge medications or the care you received while you were in the hospital after you are discharged, you can call the unit and asked to speak with the hospitalist on call if the hospitalist that took care of you is not available. Once you are discharged, your primary care physician will handle any further medical issues. Please note that NO REFILLS for any discharge medications will be authorized once you are discharged, as it is imperative that you return to your primary care physician (or establish a relationship with a primary care physician if you do not have one) for your aftercare needs so that they can reassess your need for medications and monitor your lab values.  Referral for home health nursing called to [facility].  Agency is on call for high risk for readmission service this week.  [phone]

## 2015-06-23 NOTE — Discharge Summary (Signed)
[place] [facility] [place]   PATIENT NAME: Kristine Macias    MR#:  [id number]  DATE OF BIRTH:  [birth year]  DATE OF ADMISSION:  06/21/2015 ADMITTING PHYSICIAN: Epifanio Lesches, MD  DATE OF DISCHARGE: 06/22/2015  PRIMARY CARE PHYSICIAN: Leonides Sake, MD    ADMISSION DIAGNOSIS:  Seizure ([place]) [R56.9] Alcohol withdrawal seizure with complication, with unspecified complication ([place]) [V20.233, R56.9] Benzodiazepine withdrawal with complication ([place]) [I35.686]  DISCHARGE DIAGNOSIS:  Active Problems:   Drug withdrawal seizure ([place])   SECONDARY DIAGNOSIS:   Past Medical History  Diagnosis Date  . Anxiety   . Chlamydia 2007  . Irregular heart beat 2010    wt/diet related after evaluation  . Renal disorder   . Pneumothorax, spontaneous, tension   . Alcohol abuse   . Pancreas divisum   . Failure to thrive in adult     HOSPITAL COURSE:   1) seizure in the setting of benzodiazepine withdrawal: She has been in the process of tapering off of ativan.  She was started on this medication less than a year ago to treat anxiety and alcohol abuse.  She had tapered down to 1 mg every 4 hours and then stopped.  About 24 hours later she developed seizures and presented here. She has refills of ativan at home and the taper is being monitored by her PCP.  I have advised that she restart at 1 mg every 8 hours and then attempt a slow taper. She will follow up with her PCP for further recommendations.  She was seen by neurology during the admission and Dr. Irish Elders agreed with this plan. No antiepileptics.  2) alcohol abuse: cessation counseling provided.  She will taper ativan which should help to control symtoms of ETOH withdrawal.  3) protein calorie malnutrition: She has a G-tube in place and administers Jevity feedings. History of Crohn's disease. She will follow-up with her outpatient provider.  4) ongoing tobacco abuse: Smoking  cessation counseling provided daily  #5 hypokalemia: Potassium replaced prior to discharge this should be followed outpatient setting  #6 alcoholic hepatitis: Likely due to recent alcohol abuse. This should be followed up with repeat hepatic function panel in the outpatient setting.   DISCHARGE CONDITIONS:   Stable  CONSULTS OBTAINED:  Treatment Team:  Leotis Pain, MD  DRUG ALLERGIES:   Allergies  Allergen Reactions  . Aspirin Shortness Of Breath  . Effexor [Venlafaxine] Anaphylaxis  . Sulfa Antibiotics Anaphylaxis  . Tetracyclines & Related Anaphylaxis  . Trazodone And Nefazodone Anaphylaxis  . Latex Hives, Itching and Rash  . Paxil [Paroxetine] Hives, Itching and Rash  . Seroquel [Quetiapine Fumarate] Hives, Itching and Rash  . Zoloft [Sertraline Hcl] Hives, Itching and Rash    DISCHARGE MEDICATIONS:   Discharge Medication List as of 06/22/2015 11:55 AM    CONTINUE these medications which have NOT CHANGED   Details  folic acid (FOLVITE) 1 MG tablet Take 1 tablet (1 mg total) by mouth daily., Starting 03/03/2015, Until Discontinued, Normal    LORazepam (ATIVAN) 1 MG tablet Take 1 tablet (1 mg total) by mouth every 6 (six) hours as needed for anxiety., Starting 04/18/2015, Until Discontinued, Print    Magnesium Oxide 400 (240 MG) MG TABS Take 400 mg by mouth 2 (two) times daily., Starting 04/18/2015, Until Discontinued, Normal    Multiple Vitamin (MULTIVITAMIN WITH MINERALS) TABS tablet Take 1 tablet by mouth daily., Until Discontinued, Historical Med    omeprazole (PRILOSEC) 20 MG capsule Take 20  mg by mouth daily., Until Discontinued, Historical Med    ondansetron (ZOFRAN) 4 MG tablet Take 4 mg by mouth every 6 (six) hours as needed for nausea or vomiting. , Until Discontinued, Historical Med    potassium chloride (K-DUR) 10 MEQ tablet Take 1 tablet (10 mEq total) by mouth 2 (two) times daily., Starting 04/18/2015, Until Discontinued, Print    thiamine 100 MG tablet  Take 1 tablet (100 mg total) by mouth daily., Starting 03/03/2015, Until Discontinued, Normal    Nutritional Supplements (FEEDING SUPPLEMENT, JEVITY 1.5 CAL/FIBER,) LIQD Place 240 mLs into feeding tube 4 (four) times daily., Starting 04/18/2015, Until Discontinued, Print    Water For Irrigation, Sterile (FREE WATER) SOLN Place 200 mLs into feeding tube every 8 (eight) hours., Starting 05/23/2015, Until Discontinued, Print         DISCHARGE INSTRUCTIONS:    Stable condition. Regular diet. Jevity feedings as well. Home health nursing and physical therapy.  If you experience worsening of your admission symptoms, develop shortness of breath, life threatening emergency, suicidal or homicidal thoughts you must seek medical attention immediately by calling 911 or calling your MD immediately  if symptoms less severe.  You Must read complete instructions/literature along with all the possible adverse reactions/side effects for all the Medicines you take and that have been prescribed to you. Take any new Medicines after you have completely understood and accept all the possible adverse reactions/side effects.   Please note  You were cared for by a hospitalist during your hospital stay. If you have any questions about your discharge medications or the care you received while you were in the hospital after you are discharged, you can call the unit and asked to speak with the hospitalist on call if the hospitalist that took care of you is not available. Once you are discharged, your primary care physician will handle any further medical issues. Please note that NO REFILLS for any discharge medications will be authorized once you are discharged, as it is imperative that you return to your primary care physician (or establish a relationship with a primary care physician if you do not have one) for your aftercare needs so that they can reassess your need for medications and monitor your lab values.    Today    CHIEF COMPLAINT:   Chief Complaint  Patient presents with  . Seizures    HISTORY OF PRESENT ILLNESS:  Kristine Macias is a 29 y.o. female with a known history of Crohn's disease and is post U for severe protein calorie malnutrition, history of anxiety on Ativan, history of alcohol abuse comes in with 6 episodes of seizures this morning witnessed by the mother. Patient says that she is not taking the Ativan for the past 2 days. Last dose was Sunday. Patient usually takes 1 mg of Ativan up to 6 mg per day but recently trying to wean her off Ativan herself, trying to decrease the dose by about 0.5 mg dose up to 6 mg per day for the last dose was Sunday. Previously she had history of seizures with Ativan withdrawal but this time she had 6 seizures. She did not have any seizures by EMS or in the emergency room. Patient is a heavy drinker and she is trying to quit but restarted and she had about 3 cans of 12 ounce each.very anxious, tremulous. Awake alert oriented able to talk full Sentences, no hypoxia.   VITAL SIGNS:  Blood pressure 114/76, pulse 92, temperature 98 F (36.7 C), temperature  source Oral, resp. rate 18, height 5' 6"  (1.676 m), weight 38.329 kg (84 lb 8 oz), SpO2 96 %.  I/O:  No intake or output data in the 24 hours ending 06/23/15 1409  PHYSICAL EXAMINATION:  GENERAL:  29 y.o.-year-old patient lying in the bed with no acute distress. Thin EYES: Pupils equal, round, reactive to light and accommodation. No scleral icterus. Extraocular muscles intact.  HEENT: Head atraumatic, normocephalic. Oropharynx and nasopharynx clear.  NECK:  Supple, no jugular venous distention. No thyroid enlargement, no tenderness.  LUNGS: Normal breath sounds bilaterally, no wheezing, rales,rhonchi or crepitation. No use of accessory muscles of respiration.  CARDIOVASCULAR: S1, S2 normal. No murmurs, rubs, or gallops.  ABDOMEN: Soft, non-tender, non-distended. Bowel sounds present. No organomegaly or  mass.  EXTREMITIES: No pedal edema, cyanosis, or clubbing.  NEUROLOGIC: Cranial nerves II through XII are intact. Muscle strength 5/5 in all extremities. Sensation intact. Gait not checked.  PSYCHIATRIC: The patient is alert and oriented x 3. Anxious SKIN: No obvious rash, lesion, or ulcer. Pale  DATA REVIEW:   CBC  Recent Labs Lab 06/22/15 0408  WBC 6.0  HGB 12.2  HCT 34.9*  PLT 115*    Chemistries   Recent Labs Lab 06/21/15 0938 06/22/15 0408  NA 137 135  K 3.4* 3.0*  CL 96* 101  CO2 28 27  GLUCOSE 118* 76  BUN 18 17  CREATININE 0.60 0.57  CALCIUM 8.0* 7.7*  AST 696*  --   ALT 147*  --   ALKPHOS 172*  --   BILITOT 1.3*  --     Cardiac Enzymes  Recent Labs Lab 06/21/15 0938  TROPONINI <0.03    Microbiology Results  Results for orders placed or performed during the hospital encounter of 06/21/15  C difficile quick scan w PCR reflex     Status: None   Collection Time: 06/21/15  5:48 PM  Result Value Ref Range Status   C Diff antigen NEGATIVE NEGATIVE Final   C Diff toxin NEGATIVE NEGATIVE Final   C Diff interpretation Negative for C. difficile  Final    RADIOLOGY:  No results found.  EKG:   Orders placed or performed during the hospital encounter of 06/21/15  . ED EKG  . ED EKG  . EKG      Management plans discussed with the patient, family and they are in agreement.  CODE STATUS: Full  TOTAL TIME TAKING CARE OF THIS PATIENT: 40 minutes.  Greater than 50% of time spent in care coordination and counseling.  Myrtis Ser M.D on 06/23/2015 at 2:09 PM  Between 7am to 6pm - Pager - [phone]  After 6pm go to [email or web address] - password [email or web address] [facility]  [place] [facility]  Office  [phone]  CC: Primary care physician; Leonides Sake, MD

## 2015-06-25 NOTE — Care Management Note (Signed)
Case Management Note  Patient Details  Name: Kristine Macias MRN: [id number] Date of Birth: 08-22-1986  Subjective/Objective:   Received a phone call from Ms blecher reporting that no one from [facility] had contacted her. This Chartered certified accountant and requested that they call Ms Dingus immediately to set up an appointment for home health RN services.                  Action/Plan:   Expected Discharge Date:                  Expected Discharge Plan:     In-House Referral:     Discharge planning Services     Post Acute Care Choice:    Choice offered to:     DME Arranged:    DME Agency:     HH Arranged:    [place] Agency:     Status of Service:     Medicare Important Message Given:    Date Medicare IM Given:    Medicare IM give by:    Date Additional Medicare IM Given:    Additional Medicare Important Message give by:     If discussed at [place] of Stay Meetings, dates discussed:    Additional Comments:  Leightyn Cina A, RN 06/25/2015, 4:46 PM

## 2015-06-26 NOTE — Progress Notes (Signed)
06/26/2015 6:11 PM  Patient called back to unit; reports [facility] has not called her or come to her house. CM called and made aware; reports they have called [facility] yesterday and made them aware; per CM, [facility] is supposed to be in contact with patient. Patient made aware of this and provided with [place] phone number.

## 2015-06-28 DIAGNOSIS — R569 Unspecified convulsions: Secondary | ICD-10-CM

## 2015-06-28 HISTORY — DX: Unspecified convulsions: R56.9

## 2015-06-30 ENCOUNTER — Inpatient Hospital Stay
Admission: EM | Admit: 2015-06-30 | Discharge: 2015-07-02 | DRG: 896 | Disposition: A | Discharge status: Home Health | Payer: Medicaid Other | Attending: Internal Medicine | Admitting: Internal Medicine

## 2015-06-30 ENCOUNTER — Encounter: Payer: Self-pay | Admitting: Emergency Medicine

## 2015-06-30 DIAGNOSIS — K5190 Ulcerative colitis, unspecified, without complications: Secondary | ICD-10-CM | POA: Diagnosis present

## 2015-06-30 DIAGNOSIS — F17210 Nicotine dependence, cigarettes, uncomplicated: Secondary | ICD-10-CM | POA: Diagnosis present

## 2015-06-30 DIAGNOSIS — K219 Gastro-esophageal reflux disease without esophagitis: Secondary | ICD-10-CM | POA: Diagnosis present

## 2015-06-30 DIAGNOSIS — F10230 Alcohol dependence with withdrawal, uncomplicated: Principal | ICD-10-CM | POA: Diagnosis present

## 2015-06-30 DIAGNOSIS — Z882 Allergy status to sulfonamides status: Secondary | ICD-10-CM

## 2015-06-30 DIAGNOSIS — F13239 Sedative, hypnotic or anxiolytic dependence with withdrawal, unspecified: Secondary | ICD-10-CM | POA: Diagnosis present

## 2015-06-30 DIAGNOSIS — R569 Unspecified convulsions: Secondary | ICD-10-CM | POA: Diagnosis present

## 2015-06-30 DIAGNOSIS — F19239 Other psychoactive substance dependence with withdrawal, unspecified: Secondary | ICD-10-CM

## 2015-06-30 DIAGNOSIS — Z888 Allergy status to other drugs, medicaments and biological substances status: Secondary | ICD-10-CM

## 2015-06-30 DIAGNOSIS — E43 Unspecified severe protein-calorie malnutrition: Secondary | ICD-10-CM | POA: Diagnosis present

## 2015-06-30 DIAGNOSIS — Z91040 Latex allergy status: Secondary | ICD-10-CM

## 2015-06-30 DIAGNOSIS — F10930 Alcohol use, unspecified with withdrawal, uncomplicated: Secondary | ICD-10-CM

## 2015-06-30 DIAGNOSIS — Z931 Gastrostomy status: Secondary | ICD-10-CM

## 2015-06-30 DIAGNOSIS — F1310 Sedative, hypnotic or anxiolytic abuse, uncomplicated: Secondary | ICD-10-CM

## 2015-06-30 DIAGNOSIS — Z79899 Other long term (current) drug therapy: Secondary | ICD-10-CM

## 2015-06-30 DIAGNOSIS — F411 Generalized anxiety disorder: Secondary | ICD-10-CM | POA: Diagnosis present

## 2015-06-30 DIAGNOSIS — Z886 Allergy status to analgesic agent status: Secondary | ICD-10-CM

## 2015-06-30 DIAGNOSIS — Z681 Body mass index (BMI) 19 or less, adult: Secondary | ICD-10-CM

## 2015-06-30 DIAGNOSIS — F1020 Alcohol dependence, uncomplicated: Secondary | ICD-10-CM | POA: Diagnosis present

## 2015-06-30 LAB — BASIC METABOLIC PANEL
ANION GAP: 10 (ref 5–15)
BUN: 13 mg/dL (ref 6–20)
CO2: 26 mmol/L (ref 22–32)
Calcium: 8.2 mg/dL — ABNORMAL LOW (ref 8.9–10.3)
Chloride: 100 mmol/L — ABNORMAL LOW (ref 101–111)
Creatinine, Ser: 0.52 mg/dL (ref 0.44–1.00)
Glucose, Bld: 88 mg/dL (ref 65–99)
POTASSIUM: 3.8 mmol/L (ref 3.5–5.1)
SODIUM: 136 mmol/L (ref 135–145)

## 2015-06-30 LAB — CBC WITH DIFFERENTIAL/PLATELET
BASOS ABS: 0 10*3/uL (ref 0–0.1)
Basophils Relative: 1 %
EOS PCT: 1 %
Eosinophils Absolute: 0.1 10*3/uL (ref 0–0.7)
HCT: 40.1 % (ref 35.0–47.0)
HEMOGLOBIN: 13.8 g/dL (ref 12.0–16.0)
LYMPHS PCT: 37 %
Lymphs Abs: 2 10*3/uL (ref 1.0–3.6)
MCH: 35.2 pg — ABNORMAL HIGH (ref 26.0–34.0)
MCHC: 34.3 g/dL (ref 32.0–36.0)
MCV: 102.5 fL — AB (ref 80.0–100.0)
Monocytes Absolute: 0.4 10*3/uL (ref 0.2–0.9)
Monocytes Relative: 8 %
NEUTROS PCT: 53 %
Neutro Abs: 2.9 10*3/uL (ref 1.4–6.5)
PLATELETS: 164 10*3/uL (ref 150–440)
RBC: 3.92 MIL/uL (ref 3.80–5.20)
RDW: 13.6 % (ref 11.5–14.5)
WBC: 5.5 10*3/uL (ref 3.6–11.0)

## 2015-06-30 LAB — ETHANOL: ALCOHOL ETHYL (B): 183 mg/dL — AB (ref <5)

## 2015-06-30 MED ORDER — SODIUM CHLORIDE 0.9 % IV BOLUS (SEPSIS)
1000.0000 mL | Freq: Once | INTRAVENOUS | Status: AC
  Administered 2015-06-30: 1000 mL via INTRAVENOUS

## 2015-06-30 MED ORDER — LORAZEPAM 2 MG/ML IJ SOLN
1.0000 mg | Freq: Once | INTRAMUSCULAR | Status: AC
  Administered 2015-07-01: 1 mg via INTRAVENOUS
  Filled 2015-06-30: qty 1

## 2015-06-30 NOTE — ED Provider Notes (Signed)
[facility] Emergency Department Provider Note   ____________________________________________  Time seen:  I have reviewed the triage vital signs and the triage nursing note.  HISTORY  Chief Complaint Seizures   Historian Patient  HPI Kristine Macias is a 29 y.o. female with a history of benzodiazepine use for anxiety daily, as well as alcohol use and abuse, whose last drink was yesterday, and she had a general tonic-clonic seizure today. She does feel like this was withdrawal related. She has had alcohol withdrawal seizure in the past. She has no injury from the seizure. She's not been recently ill, however she has been on a "binge." Symptoms are moderate. She denies depression or suicidal ideation.    Past Medical History  Diagnosis Date  . Anxiety   . Chlamydia 2007  . Irregular heart beat 2010    wt/diet related after evaluation  . Renal disorder   . Pneumothorax, spontaneous, tension   . Alcohol abuse   . Pancreas divisum   . Failure to thrive in adult     Patient Active Problem List   Diagnosis Date Noted  . Drug withdrawal seizure ([place]) 06/21/2015  . Chest pain 05/20/2015  . GAD (generalized anxiety disorder) 04/17/2015  . Panic disorder 04/17/2015  . Alcohol withdrawal ([place]) 04/16/2015  . Abdominal pain, generalized 04/16/2015  . Vomiting and diarrhea   . Failure to thrive in adult   . Intractable nausea and vomiting 03/01/2015  . Hypokalemia 03/01/2015  . Protein-calorie malnutrition, severe ([place]) 01/05/2015  . Alcohol abuse   . Generalized anxiety disorder 11/26/2014    Class: Chronic  . Alcohol use disorder, severe, dependence ([place]) 11/25/2014  . Adnexal mass 04/10/2012    Past Surgical History  Procedure Laterality Date  . Cesarean section    . Chest tube insertion    . Esophagogastroduodenoscopy N/A 01/06/2015    Procedure: ESOPHAGOGASTRODUODENOSCOPY (EGD);  Surgeon: Josefine Class, MD;  Location: [facility] ENDOSCOPY;   Service: Endoscopy;  Laterality: N/A;  . Colonoscopy with propofol N/A 01/06/2015    Procedure: COLONOSCOPY WITH PROPOFOL;  Surgeon: Josefine Class, MD;  Location: [facility] ENDOSCOPY;  Service: Endoscopy;  Laterality: N/A;  . Peg placement N/A 01/11/2015    Procedure: PERCUTANEOUS ENDOSCOPIC GASTROSTOMY (PEG) PLACEMENT;  Surgeon: Josefine Class, MD;  Location: [facility] ENDOSCOPY;  Service: Endoscopy;  Laterality: N/A;  . Esophagogastroduodenoscopy N/A 01/11/2015    Rein-normal with PEG placement    Current Outpatient Rx  Name  Route  Sig  Dispense  Refill  . folic acid (FOLVITE) 1 MG tablet   Oral   Take 1 tablet (1 mg total) by mouth daily.   30 tablet   0   . LORazepam (ATIVAN) 1 MG tablet   Oral   Take 1 tablet (1 mg total) by mouth every 6 (six) hours as needed for anxiety.   32 tablet   0   . Magnesium Oxide 400 (240 MG) MG TABS   Oral   Take 400 mg by mouth 2 (two) times daily.   60 tablet   0   . Multiple Vitamin (MULTIVITAMIN WITH MINERALS) TABS tablet   Oral   Take 1 tablet by mouth daily.         . Nutritional Supplements (FEEDING SUPPLEMENT, JEVITY 1.5 CAL/FIBER,) LIQD   Per Tube   Place 240 mLs into feeding tube 4 (four) times daily. Patient not taking: Reported on 06/21/2015   28800 mL   1   . omeprazole (PRILOSEC) 20 MG  capsule   Oral   Take 20 mg by mouth daily.         . ondansetron (ZOFRAN) 4 MG tablet   Oral   Take 4 mg by mouth every 6 (six) hours as needed for nausea or vomiting.          . potassium chloride (K-DUR) 10 MEQ tablet   Oral   Take 1 tablet (10 mEq total) by mouth 2 (two) times daily.   60 tablet   0   . thiamine 100 MG tablet   Oral   Take 1 tablet (100 mg total) by mouth daily.   7 tablet   0   . Water For Irrigation, Sterile (FREE WATER) SOLN   Per Tube   Place 200 mLs into feeding tube every 8 (eight) hours. Patient not taking: Reported on 06/21/2015   1000 mL   5     Allergies Aspirin; Effexor; Sulfa  antibiotics; Tetracyclines & related; Trazodone and nefazodone; Latex; Paxil; Seroquel; and Zoloft  Family History  Problem Relation Age of Onset  . Anesthesia problems Neg Hx   . Thyroid disease Mother   . Cancer Father   . Stroke Other   . Crohn's disease Brother     Social History Social History  Substance Use Topics  . Smoking status: Current Every Day Smoker -- 2.00 packs/day    Types: Cigarettes  . Smokeless tobacco: Never Used  . Alcohol Use: 21.6 oz/week    36 Cans of beer per week     Comment: 6 beers/day; trying to quit, participating in [place]    Review of Systems  Constitutional: Negative for fever. Eyes: Negative for visual changes. ENT: Negative for sore throat. Cardiovascular: Negative for chest pain. Respiratory: Negative for shortness of breath. Gastrointestinal: Negative for abdominal pain, vomiting and diarrhea. Genitourinary: Negative for dysuria. Musculoskeletal: Negative for back pain. Skin: Negative for rash. Neurological: Negative for headache. 10 point Review of Systems otherwise negative ____________________________________________   PHYSICAL EXAM:  VITAL SIGNS: ED Triage Vitals  Enc Vitals Group     BP 06/30/15 1935 109/82 mmHg     Pulse Rate 06/30/15 1935 99     Resp 06/30/15 1935 16     Temp 06/30/15 1935 98.4 F (36.9 C)     Temp Source 06/30/15 1935 Oral     SpO2 06/30/15 1935 99 %     Weight 06/30/15 1935 92 lb (41.731 kg)     Height 06/30/15 1935 5' 6"  (1.676 m)     Head Cir --      Peak Flow --      Pain Score --      Pain Loc --      Pain Edu? --      Excl. in [place]? --      Constitutional: Alert and oriented. Slightly anxious.Well appearing and in no distress. Eyes: Conjunctivae are normal. PERRL. Normal extraocular movements. ENT   Head: Normocephalic and atraumatic.   Nose: No congestion/rhinnorhea.   Mouth/Throat: Mucous membranes are moist.   Neck: No stridor. Cardiovascular/Chest:tachycardic, regular..   No murmurs, rubs, or gallops. Respiratory: Normal respiratory effort without tachypnea nor retractions. Breath sounds are clear and equal bilaterally. No wheezes/rales/rhonchi. Gastrointestinal: Soft. No distention, no guarding, no rebound. Nontender  Genitourinary/rectal:Deferred Musculoskeletal: Nontender with normal range of motion in all extremities. No joint effusions.  No lower extremity tenderness.  No edema. Neurologic:  Normal speech and language. No gross or focal neurologic deficits are appreciated. Skin:  Skin  is warm, dry and intact. No rash noted. Psychiatric: anxious, no depressed mood. No suicidal ideation.  ____________________________________________   EKG I, Lisa Roca, MD, the attending physician have personally viewed and interpreted all ECGs.  80 beats per minute. sinus rhythm. Narrow QRS. Normal axis. Normal ST and T-wave. ____________________________________________  LABS (pertinent positives/negatives)  Alcohol 014 Basic metabolic panel without significant abnormalities CBC within normal limits  ____________________________________________  RADIOLOGY All Xrays were viewed by me. Imaging interpreted by Radiologist.  none __________________________________________  PROCEDURES  Procedure(s) performed: None  Critical Care performed: None  ____________________________________________   ED COURSE / ASSESSMENT AND PLAN  CONSULTATIONS: hospitalist for admission  Pertinent labs & imaging results that were available during my care of the patient were reviewed by me and considered in my medical decision making (see chart for details).   Patient reports history of heavy drinking and had last drink yesterday. She also reports 2 mg of Ativan every few hours for anxiety. Reports last use was yesterday. Reports a history of alcohol withdrawal seizure in the past. She had a reported witnessed seizure today which she feels like is associated with  withdrawal.  Patient was given Ativan here in the emergency department. Alcohol level is 183, unclear what her baseline is. I will go ahead and admit her even the likelihood of alcohol withdrawal seizure.  Patient / Family / Caregiver informed of clinical course, medical decision-making process, and agree with plan.    ___________________________________________   FINAL CLINICAL IMPRESSION(S) / ED DIAGNOSES   Final diagnoses:  Alcohol withdrawal, uncomplicated (HCC)       Lisa Roca, MD 06/30/15 2320

## 2015-06-30 NOTE — ED Notes (Signed)
Pt arrives via EMS from home.  Pt with c/o 4 seizures today.  Pt a/o x4 upon arrival.  No incontinence.  Pt rec'd 2 mg PO ativan prior to mother calling EMS.  EMS reports patient being in an alert and oriented state after they witnesses seizure like activity.  Pt is supposed to take Ativan for seizures per her Neurologist but is attempting to wean herself from the medication.  Pt with feeding tube.

## 2015-07-01 DIAGNOSIS — F10230 Alcohol dependence with withdrawal, uncomplicated: Secondary | ICD-10-CM | POA: Diagnosis present

## 2015-07-01 DIAGNOSIS — F1020 Alcohol dependence, uncomplicated: Secondary | ICD-10-CM

## 2015-07-01 DIAGNOSIS — F17210 Nicotine dependence, cigarettes, uncomplicated: Secondary | ICD-10-CM | POA: Diagnosis present

## 2015-07-01 DIAGNOSIS — F411 Generalized anxiety disorder: Secondary | ICD-10-CM | POA: Diagnosis present

## 2015-07-01 DIAGNOSIS — Z886 Allergy status to analgesic agent status: Secondary | ICD-10-CM | POA: Diagnosis not present

## 2015-07-01 DIAGNOSIS — Z681 Body mass index (BMI) 19 or less, adult: Secondary | ICD-10-CM | POA: Diagnosis not present

## 2015-07-01 DIAGNOSIS — Z79899 Other long term (current) drug therapy: Secondary | ICD-10-CM | POA: Diagnosis not present

## 2015-07-01 DIAGNOSIS — E43 Unspecified severe protein-calorie malnutrition: Secondary | ICD-10-CM | POA: Diagnosis present

## 2015-07-01 DIAGNOSIS — Z888 Allergy status to other drugs, medicaments and biological substances status: Secondary | ICD-10-CM | POA: Diagnosis not present

## 2015-07-01 DIAGNOSIS — Z91040 Latex allergy status: Secondary | ICD-10-CM | POA: Diagnosis not present

## 2015-07-01 DIAGNOSIS — Z882 Allergy status to sulfonamides status: Secondary | ICD-10-CM | POA: Diagnosis not present

## 2015-07-01 DIAGNOSIS — K219 Gastro-esophageal reflux disease without esophagitis: Secondary | ICD-10-CM | POA: Diagnosis present

## 2015-07-01 DIAGNOSIS — Z931 Gastrostomy status: Secondary | ICD-10-CM | POA: Diagnosis not present

## 2015-07-01 DIAGNOSIS — K5190 Ulcerative colitis, unspecified, without complications: Secondary | ICD-10-CM | POA: Diagnosis present

## 2015-07-01 DIAGNOSIS — F19239 Other psychoactive substance dependence with withdrawal, unspecified: Secondary | ICD-10-CM

## 2015-07-01 DIAGNOSIS — F1310 Sedative, hypnotic or anxiolytic abuse, uncomplicated: Secondary | ICD-10-CM

## 2015-07-01 DIAGNOSIS — R569 Unspecified convulsions: Secondary | ICD-10-CM

## 2015-07-01 DIAGNOSIS — F13239 Sedative, hypnotic or anxiolytic dependence with withdrawal, unspecified: Secondary | ICD-10-CM | POA: Diagnosis present

## 2015-07-01 LAB — CBC
HCT: 36.6 % (ref 35.0–47.0)
HEMOGLOBIN: 12.2 g/dL (ref 12.0–16.0)
MCH: 34.5 pg — ABNORMAL HIGH (ref 26.0–34.0)
MCHC: 33.4 g/dL (ref 32.0–36.0)
MCV: 103.3 fL — ABNORMAL HIGH (ref 80.0–100.0)
Platelets: 152 10*3/uL (ref 150–440)
RBC: 3.55 MIL/uL — AB (ref 3.80–5.20)
RDW: 13.6 % (ref 11.5–14.5)
WBC: 4.6 10*3/uL (ref 3.6–11.0)

## 2015-07-01 LAB — URINE DRUG SCREEN, QUALITATIVE (ARMC ONLY)
Amphetamines, Ur Screen: NOT DETECTED
BARBITURATES, UR SCREEN: NOT DETECTED
BENZODIAZEPINE, UR SCRN: POSITIVE — AB
CANNABINOID 50 NG, UR ~~LOC~~: NOT DETECTED
Cocaine Metabolite,Ur ~~LOC~~: NOT DETECTED
MDMA (Ecstasy)Ur Screen: NOT DETECTED
Methadone Scn, Ur: NOT DETECTED
OPIATE, UR SCREEN: NOT DETECTED
Phencyclidine (PCP) Ur S: NOT DETECTED
TRICYCLIC, UR SCREEN: NOT DETECTED

## 2015-07-01 LAB — URINALYSIS COMPLETE WITH MICROSCOPIC (ARMC ONLY)
BACTERIA UA: NONE SEEN
Bilirubin Urine: NEGATIVE
Glucose, UA: NEGATIVE mg/dL
Ketones, ur: NEGATIVE mg/dL
NITRITE: NEGATIVE
PH: 5 (ref 5.0–8.0)
Protein, ur: NEGATIVE mg/dL
SPECIFIC GRAVITY, URINE: 1.02 (ref 1.005–1.030)

## 2015-07-01 LAB — CREATININE, SERUM: CREATININE: 0.45 mg/dL (ref 0.44–1.00)

## 2015-07-01 MED ORDER — ACETAMINOPHEN 650 MG RE SUPP: 650.0000 mg | Freq: Four times a day (QID) | RECTAL | Status: DC | PRN

## 2015-07-01 MED ORDER — VITAMIN B-1 100 MG PO TABS
100.0000 mg | ORAL_TABLET | Freq: Every day | ORAL | Status: DC
  Administered 2015-07-01 – 2015-07-02 (×2): 100 mg via ORAL
  Filled 2015-07-01 (×2): qty 1

## 2015-07-01 MED ORDER — ACETAMINOPHEN 325 MG PO TABS: 650.0000 mg | ORAL_TABLET | Freq: Four times a day (QID) | ORAL | Status: DC | PRN

## 2015-07-01 MED ORDER — LORAZEPAM 2 MG/ML IJ SOLN
2.0000 mg | INTRAMUSCULAR | Status: DC | PRN
  Administered 2015-07-01 (×3): 2 mg via INTRAVENOUS
  Administered 2015-07-01 (×2): 3 mg via INTRAVENOUS
  Administered 2015-07-01 – 2015-07-02 (×4): 2 mg via INTRAVENOUS
  Filled 2015-07-01: qty 2
  Filled 2015-07-01 (×4): qty 1
  Filled 2015-07-01: qty 2
  Filled 2015-07-01 (×3): qty 1

## 2015-07-01 MED ORDER — ADULT MULTIVITAMIN W/MINERALS CH: 1.0000 | ORAL_TABLET | Freq: Every day | ORAL | Status: DC

## 2015-07-01 MED ORDER — SODIUM CHLORIDE 0.9 % IV SOLN
INTRAVENOUS | Status: DC
  Administered 2015-07-01 – 2015-07-02 (×3): via INTRAVENOUS

## 2015-07-01 MED ORDER — JEVITY 1.5 CAL/FIBER PO LIQD
237.0000 mL | Freq: Four times a day (QID) | ORAL | Status: DC
  Administered 2015-07-01: 120 mL
  Administered 2015-07-01 – 2015-07-02 (×2): 237 mL

## 2015-07-01 MED ORDER — ADULT MULTIVITAMIN W/MINERALS CH
1.0000 | ORAL_TABLET | Freq: Every day | ORAL | Status: DC
  Administered 2015-07-01 – 2015-07-02 (×2): 1 via ORAL
  Filled 2015-07-01 (×2): qty 1

## 2015-07-01 MED ORDER — OXYCODONE HCL 5 MG PO TABS: 5.0000 mg | ORAL_TABLET | ORAL | Status: DC | PRN

## 2015-07-01 MED ORDER — HEPARIN SODIUM (PORCINE) 5000 UNIT/ML IJ SOLN: 5000.0000 [IU] | Freq: Three times a day (TID) | INTRAMUSCULAR | Status: DC

## 2015-07-01 MED ORDER — MAGNESIUM OXIDE 400 (241.3 MG) MG PO TABS
400.0000 mg | ORAL_TABLET | Freq: Two times a day (BID) | ORAL | Status: DC
  Administered 2015-07-01 – 2015-07-02 (×4): 400 mg via ORAL
  Filled 2015-07-01 (×4): qty 1

## 2015-07-01 MED ORDER — FOLIC ACID 1 MG PO TABS
1.0000 mg | ORAL_TABLET | Freq: Every day | ORAL | Status: DC
  Administered 2015-07-01 – 2015-07-02 (×2): 1 mg via ORAL
  Filled 2015-07-01 (×2): qty 1

## 2015-07-01 MED ORDER — PANTOPRAZOLE SODIUM 40 MG PO TBEC
40.0000 mg | DELAYED_RELEASE_TABLET | Freq: Every day | ORAL | Status: DC
  Administered 2015-07-01 – 2015-07-02 (×2): 40 mg via ORAL
  Filled 2015-07-01 (×2): qty 1

## 2015-07-01 MED ORDER — ONDANSETRON HCL 4 MG PO TABS: 4.0000 mg | ORAL_TABLET | Freq: Four times a day (QID) | ORAL | Status: DC | PRN

## 2015-07-01 MED ORDER — ONDANSETRON HCL 4 MG/2ML IJ SOLN: 4.0000 mg | Freq: Four times a day (QID) | INTRAMUSCULAR | Status: DC | PRN

## 2015-07-01 MED ORDER — MORPHINE SULFATE (PF) 2 MG/ML IV SOLN: 2.0000 mg | INTRAVENOUS | Status: DC | PRN

## 2015-07-01 NOTE — Care Management Note (Addendum)
Case Management Note  Patient Details  Name: Kristine Macias MRN: [id number] Date of Birth: 1986-08-09  Subjective/Objective:                  Met with patient to discuss discharge planning. She was jerking her hands like she was cold but denies being cold. She states she has anxiety sometimes which is why she received a PEG tube about 3 months ago; she said "her organs shut down because of the anxiety". She states she lives with her mother in [place]. Patient depends on her mother for transportation. Patient states she is independent with mobility and stays in her own room where she "was drinking for fun". She has been receiving her dietary supplements through the mail without difficulty. She states she was using [facility] and now is followed by [facility] Sharrie Rothman [phone]. Patient did not want to share her mother's contact information with me but contacted her mom while i was in the room to find out name of home health agency. Her PCP is with [facility] in [place] [phone]. She states she drank ETOH and did not put in her PEG.   Action/Plan:  RN notified of patient's hand's jerking; patient is on CIWA. I have notified Sharrie Rothman with [facility] of patient admission. Follow up appointment made with [facility] on 07/15/15 at [facility]. Resumption of care orders needed prior to discharge.   Expected Discharge Date:                  Expected Discharge Plan:     In-House Referral:     Discharge planning Services     Post Acute Care Choice:    Choice offered to:  Patient  DME Arranged:    DME Agency:     HH Arranged:    [place] Agency:     Status of Service:  In process, will continue to follow  Medicare Important Message Given:    Date Medicare IM Given:    Medicare IM give by:    Date Additional Medicare IM Given:    Additional Medicare Important Message give by:     If discussed at [place] of Stay  Meetings, dates discussed:    Additional Comments:  Marshell Garfinkel, RN 07/01/2015, 10:08 AM

## 2015-07-01 NOTE — Consult Note (Signed)
[facility] Face-to-Face Psychiatry Consult   Reason for Consult:  Consult for this 29 year old woman with a history of alcohol abuse and sedative abuse who comes in after having had seizures related to both of the above. Referring Physician:  Vianne Bulls Patient Identification: Kristine Macias MRN:  [id number] Principal Diagnosis: Alcohol use disorder, severe, dependence ([place]) Diagnosis:   Patient Active Problem List   Diagnosis Date Noted  . Withdrawal seizures ([place]) [O35.009, R56.9] 07/01/2015  . Benzodiazepine abuse [F13.10] 07/01/2015  . Drug withdrawal seizure ([place]) [F81.829, R56.9] 06/21/2015  . Chest pain [R07.9] 05/20/2015  . GAD (generalized anxiety disorder) [F41.1] 04/17/2015  . Panic disorder [F41.0] 04/17/2015  . Alcohol withdrawal ([place]) [F10.239] 04/16/2015  . Abdominal pain, generalized [R10.84] 04/16/2015  . Vomiting and diarrhea [R11.10, R19.7]   . Failure to thrive in adult [R62.7]   . Intractable nausea and vomiting [R11.10] 03/01/2015  . Hypokalemia [E87.6] 03/01/2015  . Protein-calorie malnutrition, severe ([place]) [E43] 01/05/2015  . Alcohol abuse [F10.10]   . Generalized anxiety disorder [F41.1] 11/26/2014    Class: Chronic  . Alcohol use disorder, severe, dependence ([place]) [F10.20] 11/25/2014  . Adnexal mass [N94.9] 04/10/2012    Total Time spent with patient: 45 minutes  Subjective:   Kristine Macias is a 29 y.o. female patient admitted with "I had a seizure".  HPI:  Information from the patient and the chart. Patient interviewed. Chart reviewed. I'm familiar with the patient from previous hospital stays. This 29 year old woman came into the hospital after having had apparently 3 generalized seizures. Patient seems rather blas about this. She says that she had had another one about 8 days ago and has been having them fairly recent currently. She says that she blames them on the fact that somebody was trying to "taper" her on her benzodiazepines but then also says  that she forgot to take her Ativan. Patient ultimately admits that she's been drinking but says that she only had 3 beers and says that she almost never does that anymore and that the alcohol just prior to admission was the only alcohol she has had in the last week. The consult was originally phrased for depression but the patient denies having any depression at all. Says that she doesn't feel depressed or down. Doesn't feel sad. She claims to sleep well. She claims that she is eating well and gaining weight. Denies any hallucinations denies any suicidal ideation.  Medical history: Patient has Crohn's disease but on top of that she has severe weight loss and malnutrition probably in large part related to the fact that she doesn't eat well and instead drinks regularly and abuses drugs.  Social history: Does not work. Lives with her mother and her young children.  Past Psychiatric History: Patient has been seen here multiple times before for identical or similar circumstances. She has seizures recurrently which seemed to be almost certainly related to withdrawal from benzodiazepines and alcohol. She had a G-tube placed for tube feedings because she used to complain of chronic nausea and vomiting and was not eating enough to keep herself alive. The patient told me that the only medicine that she takes is Ativan. Her mother then walked into the room and corrected her that she is supposed to be taking medication for Crohn's disease. Patient scoffed at that and said that she only takes it when she feels like she is "flaring up".  Risk to Self: Is patient at risk for suicide?: No Risk to Others:   Prior Inpatient Therapy:  Prior Outpatient Therapy:    Past Medical History:  Past Medical History  Diagnosis Date  . Anxiety   . Chlamydia 2007  . Irregular heart beat 2010    wt/diet related after evaluation  . Renal disorder   . Pneumothorax, spontaneous, tension   . Alcohol abuse   . Pancreas divisum    . Failure to thrive in adult     Past Surgical History  Procedure Laterality Date  . Cesarean section    . Chest tube insertion    . Esophagogastroduodenoscopy N/A 01/06/2015    Procedure: ESOPHAGOGASTRODUODENOSCOPY (EGD);  Surgeon: Josefine Class, MD;  Location: [facility] ENDOSCOPY;  Service: Endoscopy;  Laterality: N/A;  . Colonoscopy with propofol N/A 01/06/2015    Procedure: COLONOSCOPY WITH PROPOFOL;  Surgeon: Josefine Class, MD;  Location: [facility] ENDOSCOPY;  Service: Endoscopy;  Laterality: N/A;  . Peg placement N/A 01/11/2015    Procedure: PERCUTANEOUS ENDOSCOPIC GASTROSTOMY (PEG) PLACEMENT;  Surgeon: Josefine Class, MD;  Location: [facility] ENDOSCOPY;  Service: Endoscopy;  Laterality: N/A;  . Esophagogastroduodenoscopy N/A 01/11/2015    Rein-normal with PEG placement   Family History:  Family History  Problem Relation Age of Onset  . Anesthesia problems Neg Hx   . Thyroid disease Mother   . Cancer Father   . Stroke Other   . Crohn's disease Brother    Family Psychiatric  History: No family history identify Social History:  History  Alcohol Use  . 21.6 oz/week  . 76 Cans of beer per week    Comment: 6 beers/day; trying to quit, participating in AA     History  Drug Use No    Social History   Social History  . Marital Status: Single    Spouse Name: N/A  . Number of Children: N/A  . Years of Education: N/A   Social History Main Topics  . Smoking status: Current Every Day Smoker -- 2.00 packs/day    Types: Cigarettes  . Smokeless tobacco: Never Used  . Alcohol Use: 21.6 oz/week    36 Cans of beer per week     Comment: 6 beers/day; trying to quit, participating in [place]  . Drug Use: No  . Sexual Activity: Yes    Birth Control/ Protection: None   Other Topics Concern  . None   Social History Narrative   Lives with mother & 2 children   disabled   Additional Social History:                          Allergies:   Allergies  Allergen Reactions   . Aspirin Shortness Of Breath  . Effexor [Venlafaxine] Anaphylaxis  . Sulfa Antibiotics Anaphylaxis  . Tetracyclines & Related Anaphylaxis  . Trazodone And Nefazodone Anaphylaxis  . Latex Hives, Itching and Rash  . Paxil [Paroxetine] Hives, Itching and Rash  . Seroquel [Quetiapine Fumarate] Hives, Itching and Rash  . Zoloft [Sertraline Hcl] Hives, Itching and Rash    Labs:  Results for orders placed or performed during the hospital encounter of 06/30/15 (from the past 48 hour(s))  Basic metabolic panel     Status: Abnormal   Collection Time: 06/30/15 10:16 PM  Result Value Ref Range   Sodium 136 135 - 145 mmol/L   Potassium 3.8 3.5 - 5.1 mmol/L   Chloride 100 (L) 101 - 111 mmol/L   CO2 26 22 - 32 mmol/L   Glucose, Bld 88 65 - 99 mg/dL   BUN 13  6 - 20 mg/dL   Creatinine, Ser 0.52 0.44 - 1.00 mg/dL   Calcium 8.2 (L) 8.9 - 10.3 mg/dL   GFR calc non Af Amer >60 >60 mL/min   GFR calc Af Amer >60 >60 mL/min    Comment: (NOTE) The eGFR has been calculated using the CKD EPI equation. This calculation has not been validated in all clinical situations. eGFR's persistently <60 mL/min signify possible Chronic Kidney Disease.    Anion gap 10 5 - 15  CBC with Differential     Status: Abnormal   Collection Time: 06/30/15 10:16 PM  Result Value Ref Range   WBC 5.5 3.6 - 11.0 K/uL   RBC 3.92 3.80 - 5.20 MIL/uL   Hemoglobin 13.8 12.0 - 16.0 g/dL   HCT 40.1 35.0 - 47.0 %   MCV 102.5 (H) 80.0 - 100.0 fL   MCH 35.2 (H) 26.0 - 34.0 pg   MCHC 34.3 32.0 - 36.0 g/dL   RDW 13.6 11.5 - 14.5 %   Platelets 164 150 - 440 K/uL   Neutrophils Relative % 53 %   Neutro Abs 2.9 1.4 - 6.5 K/uL   Lymphocytes Relative 37 %   Lymphs Abs 2.0 1.0 - 3.6 K/uL   Monocytes Relative 8 %   Monocytes Absolute 0.4 0.2 - 0.9 K/uL   Eosinophils Relative 1 %   Eosinophils Absolute 0.1 0 - 0.7 K/uL   Basophils Relative 1 %   Basophils Absolute 0.0 0 - 0.1 K/uL  Ethanol     Status: Abnormal   Collection Time:  06/30/15 10:16 PM  Result Value Ref Range   Alcohol, Ethyl (B) 183 (H) <5 mg/dL    Comment:        LOWEST DETECTABLE LIMIT FOR SERUM ALCOHOL IS 5 mg/dL FOR MEDICAL PURPOSES ONLY   CBC     Status: Abnormal   Collection Time: 07/01/15  5:15 AM  Result Value Ref Range   WBC 4.6 3.6 - 11.0 K/uL   RBC 3.55 (L) 3.80 - 5.20 MIL/uL   Hemoglobin 12.2 12.0 - 16.0 g/dL   HCT 36.6 35.0 - 47.0 %   MCV 103.3 (H) 80.0 - 100.0 fL   MCH 34.5 (H) 26.0 - 34.0 pg   MCHC 33.4 32.0 - 36.0 g/dL   RDW 13.6 11.5 - 14.5 %   Platelets 152 150 - 440 K/uL  Creatinine, serum     Status: None   Collection Time: 07/01/15  5:15 AM  Result Value Ref Range   Creatinine, Ser 0.45 0.44 - 1.00 mg/dL   GFR calc non Af Amer >60 >60 mL/min   GFR calc Af Amer >60 >60 mL/min    Comment: (NOTE) The eGFR has been calculated using the CKD EPI equation. This calculation has not been validated in all clinical situations. eGFR's persistently <60 mL/min signify possible Chronic Kidney Disease.     Current Facility-Administered Medications  Medication Dose Route Frequency Provider Last Rate Last Dose  . 0.9 %  sodium chloride infusion   Intravenous Continuous Epifanio Lesches, MD 50 mL/hr at 07/01/15 1411    . acetaminophen (TYLENOL) tablet 650 mg  650 mg Oral Q6H PRN Lytle Butte, MD       Or  . acetaminophen (TYLENOL) suppository 650 mg  650 mg Rectal Q6H PRN Lytle Butte, MD      . feeding supplement (JEVITY 1.5 CAL/FIBER) liquid 237 mL  237 mL Per Tube QID Epifanio Lesches, MD   120 mL at 07/01/15  1660  . folic acid (FOLVITE) tablet 1 mg  1 mg Oral Daily Lytle Butte, MD   1 mg at 07/01/15 1018  . heparin injection 5,000 Units  5,000 Units Subcutaneous 3 times per day Lytle Butte, MD   5,000 Units at 07/01/15 0321  . LORazepam (ATIVAN) injection 2-3 mg  2-3 mg Intravenous Q1H PRN Lytle Butte, MD   2 mg at 07/01/15 1807  . magnesium oxide (MAG-OX) tablet 400 mg  400 mg Oral BID Lytle Butte, MD   400 mg at  07/01/15 1018  . morphine 2 MG/ML injection 2 mg  2 mg Intravenous Q4H PRN Lytle Butte, MD      . multivitamin with minerals tablet 1 tablet  1 tablet Oral Daily Lytle Butte, MD   1 tablet at 07/01/15 1018  . ondansetron (ZOFRAN) tablet 4 mg  4 mg Oral Q6H PRN Lytle Butte, MD       Or  . ondansetron [facility]) injection 4 mg  4 mg Intravenous Q6H PRN Lytle Butte, MD      . oxyCODONE (Oxy IR/ROXICODONE) immediate release tablet 5 mg  5 mg Oral Q4H PRN Lytle Butte, MD      . pantoprazole (PROTONIX) EC tablet 40 mg  40 mg Oral Daily Lytle Butte, MD   40 mg at 07/01/15 1018  . thiamine (VITAMIN B-1) tablet 100 mg  100 mg Oral Daily Lytle Butte, MD   100 mg at 07/01/15 1018    Musculoskeletal: Strength & Muscle Tone: decreased Gait & Station: unsteady Patient leans: N/A  Psychiatric Specialty Exam: Review of Systems  Constitutional: Positive for malaise/fatigue.  HENT: Negative.   Eyes: Negative.   Respiratory: Negative.   Cardiovascular: Negative.   Gastrointestinal: Negative.   Musculoskeletal: Negative.   Skin: Negative.   Neurological: Negative.   Psychiatric/Behavioral: Negative for depression, suicidal ideas, hallucinations, memory loss and substance abuse. The patient is nervous/anxious. The patient does not have insomnia.     Blood pressure 118/81, pulse 84, temperature 98.6 F (37 C), temperature source Oral, resp. rate 18, height _0  (1.676 m), weight 39.282 kg (86 lb 9.6 oz), SpO2 100 %.Body mass index is 13.98 kg/(m^2).  General Appearance: Disheveled  Eye Sport and exercise psychologist::  Fair  Speech:  Slow  Volume:  Decreased  Mood:  Euthymic  Affect:  Constricted  Thought Process:  Slow. Minimal. Impression of 1 with some degree of cognitive impairment at this point.  Orientation:  Other:  Patient told me it was 52 and October.  Thought Content:  Negative  Suicidal Thoughts:  No  Homicidal Thoughts:  No  Memory:  Immediate;   Good Recent;   Good Remote;   Poor   Judgement:  Impaired  Insight:  Shallow  Psychomotor Activity:  Decreased  Concentration:  Fair  Recall:  [employer] of Knowledge:Fair  Language: Fair  Akathisia:  No  Handed:  Right  AIMS (if indicated):     Assets:  Housing Social Support  ADL's:  Impaired  Cognition: Impaired,  Mild  Sleep:      Treatment Plan Summary: Plan This is a 29 year old woman who is drinking herself to death and continues to abuse benzodiazepines. According to the controlled substance database she is prescribed 120 of her Ativan every month and this dose has been stable and continued for at least the last 6 months. The patient tells me multiple conflicting stories about how she uses her medicine.  She tells me at one point that her doctor said she could take 8 of them a day which makes no sense. Later she says the doctor tried to taper her "too quickly". This would not be reflected by the prescriptions. I suspect that what really happens is that the patient overuses the pills that she is given and then runs out of them regularly. She tells me that her greatest stress is her panic at running short of her medicine. I pointed out to her that if she would only take the medicine as it is correctly prescribed that she would never have to feel like she was going to run out of it. She is continuing to drink regularly. I find it unbelievable that the only beer she has had recently was just prior to coming into the hospital in fact that really makes very little sense. Patient's insight is poor. I tried to give her some counseling today about how she is working her way towards dying and further impairment. I think that honestly she is starting to have more cognitive impairment related to her poor nutrition and drinking as well. She does not have major depression and she does not require antidepressants at this point. She has been prescribed them in the past and never been compliant and never followed up. I will follow-up after the  weekend if needed.  Disposition: Patient does not meet criteria for psychiatric inpatient admission. Supportive therapy provided about ongoing stressors. Discussed crisis plan, support from social network, calling 911, coming to the Emergency Department, and calling Suicide Hotline.  Kristine Macias 07/01/2015 6:26 PM

## 2015-07-01 NOTE — H&P (Signed)
[facility] NAME: Kristine Macias    MR#:  [id number]  DATE OF BIRTH:  1986-07-09   DATE OF ADMISSION:  06/30/2015  PRIMARY CARE PHYSICIAN: Leonides Sake, MD   REQUESTING/REFERRING PHYSICIAN: Reita Cliche  CHIEF COMPLAINT:   Chief Complaint  Patient presents with  . Seizures    HISTORY OF PRESENT ILLNESS:  Kristine Macias  is a 29 y.o. female with a known history of alcohol abuse, anxiety presenting after seizure. She has been dealing with anxiety daily-Ativan usage her dose is supposed be 2 mg oral every 4 hours, she also deals with alcohol abuse. Since that she is a binge drinker last drink about 24 hours ago, she states she simply forgot her Ativan for 1 day duration. Because the combination of lack of alcohol and Ativan she had a seizure. Still complains of nausea status, sweating as well as tremors  PAST MEDICAL HISTORY:   Past Medical History  Diagnosis Date  . Anxiety   . Chlamydia 2007  . Irregular heart beat 2010    wt/diet related after evaluation  . Renal disorder   . Pneumothorax, spontaneous, tension   . Alcohol abuse   . Pancreas divisum   . Failure to thrive in adult     PAST SURGICAL HISTORY:   Past Surgical History  Procedure Laterality Date  . Cesarean section    . Chest tube insertion    . Esophagogastroduodenoscopy N/A 01/06/2015    Procedure: ESOPHAGOGASTRODUODENOSCOPY (EGD);  Surgeon: Josefine Class, MD;  Location: [facility] ENDOSCOPY;  Service: Endoscopy;  Laterality: N/A;  . Colonoscopy with propofol N/A 01/06/2015    Procedure: COLONOSCOPY WITH PROPOFOL;  Surgeon: Josefine Class, MD;  Location: [facility] ENDOSCOPY;  Service: Endoscopy;  Laterality: N/A;  . Peg placement N/A 01/11/2015    Procedure: PERCUTANEOUS ENDOSCOPIC GASTROSTOMY (PEG) PLACEMENT;  Surgeon: Josefine Class, MD;  Location: [facility] ENDOSCOPY;  Service: Endoscopy;  Laterality: N/A;  . Esophagogastroduodenoscopy N/A  01/11/2015    Rein-normal with PEG placement    SOCIAL HISTORY:   Social History  Substance Use Topics  . Smoking status: Current Every Day Smoker -- 2.00 packs/day    Types: Cigarettes  . Smokeless tobacco: Never Used  . Alcohol Use: 21.6 oz/week    36 Cans of beer per week     Comment: 6 beers/day; trying to quit, participating in [place]:   Family History  Problem Relation Age of Onset  . Anesthesia problems Neg Hx   . Thyroid disease Mother   . Cancer Father   . Stroke Other   . Crohn's disease Brother     DRUG ALLERGIES:   Allergies  Allergen Reactions  . Aspirin Shortness Of Breath  . Effexor [Venlafaxine] Anaphylaxis  . Sulfa Antibiotics Anaphylaxis  . Tetracyclines & Related Anaphylaxis  . Trazodone And Nefazodone Anaphylaxis  . Latex Hives, Itching and Rash  . Paxil [Paroxetine] Hives, Itching and Rash  . Seroquel [Quetiapine Fumarate] Hives, Itching and Rash  . Zoloft [Sertraline Hcl] Hives, Itching and Rash    REVIEW OF SYSTEMS:  REVIEW OF SYSTEMS:  CONSTITUTIONAL: Denies fevers, chills,  positive fatigue, weakness.  EYES: Denies blurred vision, double vision, or eye pain.  EARS, NOSE, THROAT: Denies tinnitus, ear pain, hearing loss.  RESPIRATORY: denies cough, shortness of breath, wheezing  CARDIOVASCULAR: Denies chest pain, palpitations, edema.  GASTROINTESTINAL:Positive nauseous, denies  vomiting, diarrhea, abdominal pain.  GENITOURINARY: Denies dysuria, hematuria.  ENDOCRINE:  Denies nocturia or thyroid problems. HEMATOLOGIC AND LYMPHATIC: Denies easy bruising or bleeding.  SKIN: Denies rash or lesions.  MUSCULOSKELETAL: Denies pain in neck, back, shoulder, knees, hips, or further arthritic symptoms.  NEUROLOGIC: Denies paralysis, paresthesias.  PSYCHIATRIC: Denies anxiety or depressive symptoms. Otherwise full review of systems performed by me is negative.   MEDICATIONS AT HOME:   Prior to Admission medications   Medication Sig  Start Date End Date Taking? Authorizing Provider  folic acid (FOLVITE) 1 MG tablet Take 1 tablet (1 mg total) by mouth daily. 03/03/15  Yes Demetrios Loll, MD  LORazepam (ATIVAN) 1 MG tablet Take 1 tablet (1 mg total) by mouth every 6 (six) hours as needed for anxiety. 04/18/15  Yes Loletha Grayer, MD  Magnesium Oxide 400 (240 MG) MG TABS Take 400 mg by mouth 2 (two) times daily. 04/18/15  Yes Loletha Grayer, MD  Multiple Vitamin (MULTIVITAMIN WITH MINERALS) TABS tablet Take 1 tablet by mouth daily.   Yes Historical Provider, MD  omeprazole (PRILOSEC) 20 MG capsule Take 20 mg by mouth daily.   Yes Historical Provider, MD  ondansetron (ZOFRAN) 4 MG tablet Take 4 mg by mouth every 6 (six) hours as needed for nausea or vomiting.    Yes Historical Provider, MD  potassium chloride (K-DUR) 10 MEQ tablet Take 1 tablet (10 mEq total) by mouth 2 (two) times daily. 04/18/15  Yes Loletha Grayer, MD  thiamine 100 MG tablet Take 1 tablet (100 mg total) by mouth daily. 03/03/15  Yes Demetrios Loll, MD  Nutritional Supplements (FEEDING SUPPLEMENT, JEVITY 1.5 CAL/FIBER,) LIQD Place 240 mLs into feeding tube 4 (four) times daily. Patient not taking: Reported on 06/21/2015 04/18/15   Loletha Grayer, MD  Water For Irrigation, Sterile (FREE WATER) SOLN Place 200 mLs into feeding tube every 8 (eight) hours. Patient not taking: Reported on 06/21/2015 05/23/15   Gladstone Lighter, MD      VITAL SIGNS:  Blood pressure 109/82, pulse 99, temperature 98.4 F (36.9 C), temperature source Oral, resp. rate 16, height 5' 6"  (1.676 m), weight 92 lb (41.731 kg), SpO2 99 %.  PHYSICAL EXAMINATION:  VITAL SIGNS: Filed Vitals:   06/30/15 1935  BP: 109/82  Pulse: 99  Temp: 98.4 F (36.9 C)  Resp: 16   GENERAL:28 y.o.female currently in no acute distress. Frail appearing  HEAD: Normocephalic, atraumatic.  EYES: Pupils equal, round, reactive to light. Extraocular muscles intact. No scleral icterus.  MOUTH: Moist mucosal membrane.  Dentition intact. No abscess noted.  EAR, NOSE, THROAT: Clear without exudates. No external lesions.  NECK: Supple. No thyromegaly. No nodules. No JVD.  PULMONARY: Clear to ascultation, without wheeze rails or rhonci. No use of accessory muscles, Good respiratory effort. good air entry bilaterally CHEST: Nontender to palpation.  CARDIOVASCULAR: S1 and S2. Regular rate and rhythm. No murmurs, rubs, or gallops. No edema. Pedal pulses 2+ bilaterally.  GASTROINTESTINAL: Soft, nontender, nondistended. No masses. Positive bowel sounds. No hepatosplenomegaly.  MUSCULOSKELETAL: No swelling, clubbing, or edema. Range of motion full in all extremities.  NEUROLOGIC: Cranial nerves II through XII are intact. No gross focal neurological deficits. Sensation intact. Reflexes intact.  SKIN: No ulceration, lesions, rashes, or cyanosis. Skin warm and dry. Turgor intact.  PSYCHIATRIC: Mood, affectblunted. The patient is awake, alert and oriented x 3. Insight, judgment intact.    LABORATORY PANEL:   CBC  Recent Labs Lab 06/30/15 2216  WBC 5.5  HGB 13.8  HCT 40.1  PLT 164   ------------------------------------------------------------------------------------------------------------------  Chemistries   Recent  Labs Lab 06/30/15 2216  NA 136  K 3.8  CL 100*  CO2 26  GLUCOSE 88  BUN 13  CREATININE 0.52  CALCIUM 8.2*   ------------------------------------------------------------------------------------------------------------------  Cardiac Enzymes No results for input(s): TROPONINI in the last 168 hours. ------------------------------------------------------------------------------------------------------------------  RADIOLOGY:  No results found.  EKG:   Orders placed or performed during the hospital encounter of 06/30/15  . ED EKG  . ED EKG    IMPRESSION AND PLAN:   29 year old Caucasian female history of alcohol abuse as well as anxiety presenting after seizure  1. Seizure,  secondary to alcohol/benzodiazepine withdrawal: Of note recently discharged from the hospital 10/26 for the same discharge diagnoses. She states that she recently was binge drinking stopping alcohol 24 hours ago as well as does not take her Ativan for 24 hours. We'll check prolactin, initiate CIWA protocol, seizure precautions 2. GERD without esophagitis PPI therapy 3. Venous thrombosis prophylactic: Heparin subcutaneous  All the records are reviewed and case discussed with ED provider. Management plans discussed with the patient, family and they are in agreement.  CODE STATUS: Full  TOTAL TIME TAKING CARE OF THIS PATIENT: 35  minutes.    Zaiya Annunziato,  Karenann Cai.D on 07/01/2015 at 12:49 AM  Between 7am to 6pm - Pager - [phone]  After 6pm: House Pager: - [phone]  [facility]  Office  [phone]  CC: Primary care physician; Leonides Sake, MD

## 2015-07-01 NOTE — Progress Notes (Signed)
Pt is alert and oriented x 4, denies pain, vital signs stable, on room air, no seizure activity, up to bathroom independently, pt requires bed alarm but refuses it. Pt started on peg tube feedings, first dose was not given because tube to access peg port was not available at that time, endo located a tube to access port in time for second feeding. Pt request that only 120 cc of jevity be administered because she has been without feedings for a day and did not want to get sick. IV fluids infusing, pt feels that she is mobile and refuses heparin shot. Psych consult entered.

## 2015-07-01 NOTE — Progress Notes (Signed)
[facility] [place] NAME: Kristine Macias    MR#:  [id number]  DATE OF BIRTH:  February 18, 1986  SUBJECTIVE: 29 year old female admitted for the seizures. Patient says that she forgot to take her Ativan. patient last drink was 24 hours ago she says she started to drink heavily recently. Patient was recently on October 26 for the same, patient takes Ativan right now she is trying to cut down Ativan use. No having withdrawal symptoms and shaking noted. No hallucinations. Patient is open to see a psychiatrist. But does not want to go to [facility] unit for detox. Has been having depression secondary to loss of her father, separation from her boyfriend.   CHIEF COMPLAINT:   Chief Complaint  Patient presents with  . Seizures    REVIEW OF SYSTEMS:    Review of Systems  Constitutional: Negative for fever and chills.  HENT: Negative for hearing loss.   Eyes: Negative for blurred vision, double vision and photophobia.  Respiratory: Negative for cough, hemoptysis and shortness of breath.   Cardiovascular: Negative for palpitations, orthopnea and leg swelling.  Gastrointestinal: Positive for nausea. Negative for vomiting, abdominal pain and diarrhea.  Genitourinary: Negative for dysuria and urgency.  Musculoskeletal: Negative for myalgias and neck pain.  Skin: Negative for rash.  Neurological: Positive for tremors. Negative for dizziness, focal weakness, seizures, weakness and headaches.  Psychiatric/Behavioral: Negative for memory loss. The patient does not have insomnia.     Nutrition: Tolerating Diet: Tolerating PT:      DRUG ALLERGIES:   Allergies  Allergen Reactions  . Aspirin Shortness Of Breath  . Effexor [Venlafaxine] Anaphylaxis  . Sulfa Antibiotics Anaphylaxis  . Tetracyclines & Related Anaphylaxis  . Trazodone And Nefazodone Anaphylaxis  . Latex Hives, Itching and Rash  . Paxil [Paroxetine] Hives, Itching and Rash  . Seroquel  [Quetiapine Fumarate] Hives, Itching and Rash  . Zoloft [Sertraline Hcl] Hives, Itching and Rash    VITALS:  Blood pressure 125/84, pulse 85, temperature 98.4 F (36.9 C), temperature source Oral, resp. rate 18, height 5' 6"  (1.676 m), weight 39.282 kg (86 lb 9.6 oz), SpO2 98 %.  PHYSICAL EXAMINATION:   Physical Exam  GENERAL:  29 y.o.-year-old patient lying in the bed with no acute distress.  EYES: Pupils equal, round, reactive to light and accommodation. No scleral icterus. Extraocular muscles intact.  HEENT: Head atraumatic, normocephalic. Oropharynx and nasopharynx clear.  NECK:  Supple, no jugular venous distention. No thyroid enlargement, no tenderness.  LUNGS: Normal breath sounds bilaterally, no wheezing, rales,rhonchi or crepitation. No use of accessory muscles of respiration.  CARDIOVASCULAR: S1, S2 normal. No murmurs, rubs, or gallops.  ABDOMEN: Soft, nontender, nondistended. Bowel sounds present. No organomegaly or mass.  EXTREMITIES: No pedal edema, cyanosis, or clubbing. handTremors noted. NEUROLOGIC: Cranial nerves II through XII are intact. Muscle strength 5/5 in all extremities. Sensation intact. Gait not checked.  PSYCHIATRIC: The patient is alert and oriented x 3.  SKIN: No obvious rash, lesion, or ulcer.    LABORATORY PANEL:   CBC  Recent Labs Lab 07/01/15 0515  WBC 4.6  HGB 12.2  HCT 36.6  PLT 152   ------------------------------------------------------------------------------------------------------------------  Chemistries   Recent Labs Lab 06/30/15 2216 07/01/15 0515  NA 136  --   K 3.8  --   CL 100*  --   CO2 26  --   GLUCOSE 88  --   BUN 13  --   CREATININE 0.52 0.45  CALCIUM 8.2*  --    ------------------------------------------------------------------------------------------------------------------  Cardiac Enzymes No results for input(s): TROPONINI in the last 168  hours. ------------------------------------------------------------------------------------------------------------------  RADIOLOGY:  No results found.   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 Continue CIWA protocol, use Ativan for seizures. Obtain psychiatric consult due to recurrent admissions for the same. #2 continue PPIs for GERD #3 ,severe protein calorie malnutrition: Patient is getting PEG feedings, continue them.  4. history of ulcerative colitis; follows up with GI, patient has no symptoms at this time.   All the records are reviewed and case discussed with Care Management/Social Workerr. Management plans discussed with the patient, family and they are in agreement.  CODE STATUS: full  TOTAL TIME TAKING CARE OF THIS PATIENT: 72mnutes.   POSSIBLE D/C IN 1-2 DAYS, DEPENDING ON CLINICAL CONDITION.   KEpifanio LeschesM.D on 07/01/2015 at 12:44 PM  Between 7am to 6pm - Pager - [phone]  After 6pm go to [email or web address] - password [email or web address] [facility]  Office  [phone] CC: Primary care physician; HLeonides Sake MD

## 2015-07-01 NOTE — Progress Notes (Signed)
Initial Nutrition Assessment  DOCUMENTATION CODES:    RD notes Severe Malnutrition  In the context of chronic illness on 05/21/2015, likely continues. Per MD note severe protein calorie malnutrition  INTERVENTION:   EN: Spoke with MD, Vianne Bulls agreeable to restart Jevity 1.5 boluses. Recommend continuing TF order from home of Jevity 1.5 4 boluses a day as pt on regular diet. 4 cans of Jevity 1.5 would provide 1422kcals, 60g protein and 1192m of free water. Will also recommend checking Mg, P. Will continue to monitor and make recommendations according to po intake.   NUTRITION DIAGNOSIS:   Underweight related to social / environmental circumstances as evidenced by  (BMI 14 currently, PEG in place for nutrition support).  GOAL:   Patient will meet greater than or equal to 90% of their needs  MONITOR:    (Energy Intake, Anthropometrics, Digestive system, Electrolyte and Renal Profile)  REASON FOR ASSESSMENT:   Malnutrition Screening Tool    ASSESSMENT:   Pt admitted with seizures and withdrawals from ativan and EtOH. Pt with recent admission for the same. Pt s/p PEG placement for nutrition support receives Jevity 1.5 boluses. Pt unavailable times two on visits.  Past Medical History  Diagnosis Date  . Anxiety   . Chlamydia 2007  . Irregular heart beat 2010    wt/diet related after evaluation  . Renal disorder   . Pneumothorax, spontaneous, tension   . Alcohol abuse   . Pancreas divisum   . Failure to thrive in adult     Diet Order:  Diet regular Room service appropriate?: Yes; Fluid consistency:: Thin    Current Nutrition: Limited documentation of po intake since admission  Food/Nutrition-Related History: Per MST decreased appetite PTA. Unable to clarify if pt was taking full cans of Jevity at home. Per MD note, pt last drink 24 hours ago.    Scheduled Medications:  . feeding supplement (JEVITY 1.5 CAL/FIBER)  237 mL Per Tube QID  . folic acid  1 mg Oral Daily  .  heparin  5,000 Units Subcutaneous 3 times per day  . magnesium oxide  400 mg Oral BID  . multivitamin with minerals  1 tablet Oral Daily  . pantoprazole  40 mg Oral Daily  . thiamine  100 mg Oral Daily    Continuous Medications:  . sodium chloride 50 mL/hr at 07/01/15 1411     Electrolyte/Renal Profile and Glucose Profile:   Recent Labs Lab 06/30/15 2216 07/01/15 0515  NA 136  --   K 3.8  --   CL 100*  --   CO2 26  --   BUN 13  --   CREATININE 0.52 0.45  CALCIUM 8.2*  --   GLUCOSE 88  --    Protein Profile: No results for input(s): ALBUMIN in the last 168 hours.  Gastrointestinal Profile: Last BM: unknown   Nutrition-Focused Physical Exam Findings:  Unable to complete Nutrition-Focused physical exam at this time.     Weight Change: Pt with slight weight gain since last admission of 2 lbs, however 10lbs weight loss in 2.5 months (13%)  Height:   Ht Readings from Last 1 Encounters:  07/01/15 5' 6"  (1.676 m)    Weight:   Wt Readings from Last 1 Encounters:  07/01/15 86 lb 9.6 oz (39.282 kg)    Wt Readings from Last 10 Encounters:  07/01/15 86 lb 9.6 oz (39.282 kg)  06/21/15 84 lb 8 oz (38.329 kg)  05/20/15 76 lb (34.473 kg)  04/29/15 84 lb 12.8  oz (38.465 kg)  04/18/15 86 lb 1.6 oz (39.055 kg)  03/21/15 81 lb (36.741 kg)  03/01/15 92 lb 8 oz (41.958 kg)  01/13/15 95 lb (43.092 kg)  04/24/12 99 lb 3.2 oz (44.997 kg)  04/10/12 99 lb 4.8 oz (45.042 kg)     Ideal Body Weight:   59kg  BMI:  Body mass index is 13.98 kg/(m^2).  Estimated Nutritional Needs:   Kcal:  using IBW of 59kg, BEE: 1331kcals, TEE: (IF 1.1-1.3)(AF 1.3) 1904-2250kcals  Protein:  65-77g protein (1.1-1.3g/kg)  Fluid:  1475-1756m of fluid (25-355mkg)  EDUCATION NEEDS:   Education needs no appropriate at this time   HIFairmontRD, LDN Pager [phone]

## 2015-07-01 NOTE — Progress Notes (Signed)
   07/01/15 1100  Clinical Encounter Type  Visited With Patient  Visit Type Initial  Referral From Care management  Consult/Referral To Chaplain  Spiritual Encounters  Spiritual Needs Emotional;Prayer  Stress Factors  Patient Stress Factors Financial concerns;Family relationships  Chaplain visited with patient and provided pastoral care.   Chaplain Burnette Sautter [phone]

## 2015-07-02 LAB — PROLACTIN: PROLACTIN: 12.4 ng/mL (ref 4.8–23.3)

## 2015-07-02 MED ORDER — JEVITY 1.5 CAL/FIBER PO LIQD: 237.0000 mL | Freq: Four times a day (QID) | ORAL | Status: DC

## 2015-07-02 NOTE — Care Management Note (Addendum)
Case Management Note  Patient Details  Name: Kristine Macias MRN: [id number] Date of Birth: March 22, 1986  Subjective/Objective:  Spoke with Jenny Reichmann at [facility]now [facility] updating her that [name] is discharging home today with [facility] services. Jenny Reichmann (on-call nurse this weekend) stated that she would make sure that [name] is also on their "seizure list."                 Action/Plan:   Expected Discharge Date:                  Expected Discharge Plan:     In-House Referral:     Discharge planning Services     Post Acute Care Choice:    Choice offered to:  Patient  DME Arranged:    DME Agency:     HH Arranged:    [place] Agency:     Status of Service:  In process, will continue to follow  Medicare Important Message Given:    Date Medicare IM Given:    Medicare IM give by:    Date Additional Medicare IM Given:    Additional Medicare Important Message give by:     If discussed at [place] of Stay Meetings, dates discussed:    Additional Comments:  Zakk Borgen A, RN 07/02/2015, 11:41 AM

## 2015-07-02 NOTE — Discharge Summary (Signed)
[place] at [place]   PATIENT NAME: Kristine Macias    MR#:  [id number]  DATE OF BIRTH:  10/09/1985  DATE OF ADMISSION:  06/30/2015 ADMITTING PHYSICIAN: Lytle Butte, MD  DATE OF DISCHARGE: 07/03/2015  PRIMARY CARE PHYSICIAN: Leonides Sake, MD    ADMISSION DIAGNOSIS:  Alcohol withdrawal, uncomplicated ([place]) [Q67.619]  DISCHARGE DIAGNOSIS:  Principal Problem:   Alcohol use disorder, severe, dependence ([place]) Active Problems:   Withdrawal seizures ([place])   Benzodiazepine abuse   SECONDARY DIAGNOSIS:   Past Medical History  Diagnosis Date  . Anxiety   . Chlamydia 2007  . Irregular heart beat 2010    wt/diet related after evaluation  . Renal disorder   . Pneumothorax, spontaneous, tension   . Alcohol abuse   . Pancreas divisum   . Failure to thrive in adult     HOSPITAL COURSE:  29 year old female admitted for the seizures. Patient says that she forgot to take her Ativan. patient last drink was 24 hours ago she says she started to drink heavily recently. Patient was recently on October 26 for the same, patient takes Ativan right now she is trying to cut down Ativan use. No having withdrawal symptoms and shaking noted. No hallucinations. Patient is open to see a psychiatrist. But does not want to go to [facility] unit for detox. Has been having depression secondary to loss of her father, separation from her boyfriend.   #1 seizures secondary to benzodiazepine and alcohol withdrawal. Seizures were not witnessed by medical personnel. No seizures during the hospitalization. She was evaluated by psychiatry. There is significant concern that she is taking much more Ativan than she is prescribed and then other uses alcohol or presents to the hospital. She states that her current prescription is for 1 mg 4 times a day. Dr. Weber Cooks confirmed through the controlled substance registry that she is receiving 120 tablets of 1 mg Ativan  monthly. She states that she keeps running out early. We confirmed prior to discharge that she has 40 tablets at home. This is enough to get her to Monday at which time she can call her primary care provider and discuss the situation with them. She has been advised to avoid alcohol completely and she has agreed to continue [employer]. She declined inpatient behavioral health treatment.  #2 protein calorie malnutrition: Patient continues to have PEG tube feedings at home. She states that her weight loss is due to ulcerative colitis. I agree that this is likely compounded by alcohol and Ativan abuse  #3 GERD continue PPI  DISCHARGE CONDITIONS:   [place]  CONSULTS OBTAINED:  Treatment Team:  Lytle Butte, MD Gonzella Lex, MD  DRUG ALLERGIES:   Allergies  Allergen Reactions  . Aspirin Shortness Of Breath  . Effexor [Venlafaxine] Anaphylaxis  . Sulfa Antibiotics Anaphylaxis  . Tetracyclines & Related Anaphylaxis  . Trazodone And Nefazodone Anaphylaxis  . Latex Hives, Itching and Rash  . Paxil [Paroxetine] Hives, Itching and Rash  . Seroquel [Quetiapine Fumarate] Hives, Itching and Rash  . Zoloft [Sertraline Hcl] Hives, Itching and Rash    DISCHARGE MEDICATIONS:   Current Discharge Medication List    START taking these medications   Details  !! Nutritional Supplements (FEEDING SUPPLEMENT, JEVITY 1.5 CAL/FIBER,) LIQD Place 237 mLs into feeding tube 4 (four) times daily. Qty: 30000 mL, Refills: 0     !! - Potential duplicate medications found. Please discuss with provider.    CONTINUE  these medications which have NOT CHANGED   Details  folic acid (FOLVITE) 1 MG tablet Take 1 tablet (1 mg total) by mouth daily. Qty: 30 tablet, Refills: 0    LORazepam (ATIVAN) 1 MG tablet Take 1 tablet (1 mg total) by mouth every 6 (six) hours as needed for anxiety. Qty: 32 tablet, Refills: 0    Magnesium Oxide 400 (240 MG) MG TABS Take 400 mg by mouth 2 (two) times daily. Qty: 60 tablet,  Refills: 0    Multiple Vitamin (MULTIVITAMIN WITH MINERALS) TABS tablet Take 1 tablet by mouth daily.    omeprazole (PRILOSEC) 20 MG capsule Take 20 mg by mouth daily.    ondansetron (ZOFRAN) 4 MG tablet Take 4 mg by mouth every 6 (six) hours as needed for nausea or vomiting.     potassium chloride (K-DUR) 10 MEQ tablet Take 1 tablet (10 mEq total) by mouth 2 (two) times daily. Qty: 60 tablet, Refills: 0    thiamine 100 MG tablet Take 1 tablet (100 mg total) by mouth daily. Qty: 7 tablet, Refills: 0    !! Nutritional Supplements (FEEDING SUPPLEMENT, JEVITY 1.5 CAL/FIBER,) LIQD Place 240 mLs into feeding tube 4 (four) times daily. Qty: 28800 mL, Refills: 1    Water For Irrigation, Sterile (FREE WATER) SOLN Place 200 mLs into feeding tube every 8 (eight) hours. Qty: 1000 mL, Refills: 5     !! - Potential duplicate medications found. Please discuss with provider.       DISCHARGE INSTRUCTIONS:   Fair condition. Will have home health nursing resumed on discharge. Regular diet.   If you experience worsening of your admission symptoms, develop shortness of breath, life threatening emergency, suicidal or homicidal thoughts you must seek medical attention immediately by calling 911 or calling your MD immediately  if symptoms less severe.  You Must read complete instructions/literature along with all the possible adverse reactions/side effects for all the Medicines you take and that have been prescribed to you. Take any new Medicines after you have completely understood and accept all the possible adverse reactions/side effects.   Please note  You were cared for by a hospitalist during your hospital stay. If you have any questions about your discharge medications or the care you received while you were in the hospital after you are discharged, you can call the unit and asked to speak with the hospitalist on call if the hospitalist that took care of you is not available. Once you are  discharged, your primary care physician will handle any further medical issues. Please note that NO REFILLS for any discharge medications will be authorized once you are discharged, as it is imperative that you return to your primary care physician (or establish a relationship with a primary care physician if you do not have one) for your aftercare needs so that they can reassess your need for medications and monitor your lab values.    Today   CHIEF COMPLAINT:   Chief Complaint  Patient presents with  . Seizures    HISTORY OF PRESENT ILLNESS:  Kristine Macias is a 29 y.o. female with a known history of alcohol abuse, anxiety presenting after seizure. She has been dealing with anxiety daily-Ativan usage her dose is supposed be 2 mg oral every 4 hours, she also deals with alcohol abuse. Since that she is a binge drinker last drink about 24 hours ago, she states she simply forgot her Ativan for 1 day duration. Because the combination of lack of alcohol and  Ativan she had a seizure. Still complains of nausea status, sweating as well as tremors   VITAL SIGNS:  Blood pressure 118/79, pulse 99, temperature 98.5 F (36.9 C), temperature source Oral, resp. rate 16, height 5' 6"  (1.676 m), weight 39.282 kg (86 lb 9.6 oz), SpO2 98 %.  I/O:   Intake/Output Summary (Last 24 hours) at 07/02/15 1417 Last data filed at 07/02/15 0400  Gross per 24 hour  Intake 1752.5 ml  Output      0 ml  Net 1752.5 ml    PHYSICAL EXAMINATION:  GENERAL:  29 y.o.-year-old patient lying in the bed with no acute distress. Cachectic  EYES: Pupils equal, round, reactive to light and accommodation. No scleral icterus. Extraocular muscles intact.  HEENT: Head atraumatic, normocephalic. Oropharynx and nasopharynx clear.  NECK:  Supple, no jugular venous distention. No thyroid enlargement, no tenderness.  LUNGS: Normal breath sounds bilaterally, no wheezing, rales,rhonchi or crepitation. No use of accessory muscles of  respiration.  CARDIOVASCULAR: S1, S2 normal. No murmurs, rubs, or gallops.  ABDOMEN: Soft, non-tender, non-distended. Bowel sounds present. No organomegaly or mass.  EXTREMITIES: No pedal edema, cyanosis, or clubbing.  NEUROLOGIC: Cranial nerves II through XII are intact.  globally weak but evoked Muscle strength 5/5 in all extremities. Sensation intact. Gait not checked.  PSYCHIATRIC: The patient is alert and oriented x 3.  SKIN: No obvious rash, lesion, or ulcer.   DATA REVIEW:   CBC  Recent Labs Lab 07/01/15 0515  WBC 4.6  HGB 12.2  HCT 36.6  PLT 152    Chemistries   Recent Labs Lab 06/30/15 2216 07/01/15 0515  NA 136  --   K 3.8  --   CL 100*  --   CO2 26  --   GLUCOSE 88  --   BUN 13  --   CREATININE 0.52 0.45  CALCIUM 8.2*  --     Cardiac Enzymes No results for input(s): TROPONINI in the last 168 hours.  Microbiology Results  Results for orders placed or performed during the hospital encounter of 06/21/15  C difficile quick scan w PCR reflex     Status: None   Collection Time: 06/21/15  5:48 PM  Result Value Ref Range Status   C Diff antigen NEGATIVE NEGATIVE Final   C Diff toxin NEGATIVE NEGATIVE Final   C Diff interpretation Negative for C. difficile  Final    RADIOLOGY:  No results found.  EKG:   Orders placed or performed during the hospital encounter of 06/30/15  . ED EKG  . ED EKG      Management plans discussed with the patient, family and they are in agreement.  CODE STATUS:     Code Status Orders        Start     Ordered   07/01/15 0033  Full code   Continuous     07/01/15 0032      TOTAL TIME TAKING CARE OF THIS PATIENT: 35 minutes.  Greater than 50% of time spent in care coordination and counseling.  Myrtis Ser M.D on 07/02/2015 at 2:17 PM  Between 7am to 6pm - Pager - [phone]  After 6pm go to [email or web address] - password [email or web address] [facility]  [place] [facility]  Office  [phone]  CC: Primary care  physician; Leonides Sake, MD

## 2015-07-02 NOTE — Discharge Instructions (Signed)
Call your primary care provider on Monday to discuss follow up and prescription management. Abstain from alcohol completely and continue to attend AA meetings. Increase oral intake and continue with tube feeds.   DIET:  Regular diet  DISCHARGE CONDITION:  [place]  ACTIVITY:  Activity as tolerated  OXYGEN:  Home Oxygen: No.   Oxygen Delivery: room air  DISCHARGE LOCATION:  Home with home health nursing.   If you experience worsening of your admission symptoms, develop shortness of breath, life threatening emergency, suicidal or homicidal thoughts you must seek medical attention immediately by calling 911 or calling your MD immediately  if symptoms less severe.  You Must read complete instructions/literature along with all the possible adverse reactions/side effects for all the Medicines you take and that have been prescribed to you. Take any new Medicines after you have completely understood and accpet all the possible adverse reactions/side effects.   Please note  You were cared for by a hospitalist during your hospital stay. If you have any questions about your discharge medications or the care you received while you were in the hospital after you are discharged, you can call the unit and asked to speak with the hospitalist on call if the hospitalist that took care of you is not available. Once you are discharged, your primary care physician will handle any further medical issues. Please note that NO REFILLS for any discharge medications will be authorized once you are discharged, as it is imperative that you return to your primary care physician (or establish a relationship with a primary care physician if you do not have one) for your aftercare needs so that they can reassess your need for medications and monitor your lab values.

## 2015-07-07 ENCOUNTER — Encounter: Payer: Self-pay | Admitting: Emergency Medicine

## 2015-07-07 ENCOUNTER — Emergency Department
Admission: EM | Admit: 2015-07-07 | Discharge: 2015-07-07 | Disposition: A | Discharge status: Home / Self Care | Payer: Medicaid Other | Attending: Emergency Medicine | Admitting: Emergency Medicine

## 2015-07-07 DIAGNOSIS — F1010 Alcohol abuse, uncomplicated: Secondary | ICD-10-CM

## 2015-07-07 DIAGNOSIS — Z720 Tobacco use: Secondary | ICD-10-CM | POA: Diagnosis not present

## 2015-07-07 DIAGNOSIS — Z79899 Other long term (current) drug therapy: Secondary | ICD-10-CM | POA: Diagnosis not present

## 2015-07-07 DIAGNOSIS — F1020 Alcohol dependence, uncomplicated: Secondary | ICD-10-CM | POA: Diagnosis not present

## 2015-07-07 DIAGNOSIS — R569 Unspecified convulsions: Secondary | ICD-10-CM | POA: Diagnosis not present

## 2015-07-07 DIAGNOSIS — Z91040 Latex allergy status: Secondary | ICD-10-CM | POA: Diagnosis not present

## 2015-07-07 LAB — BASIC METABOLIC PANEL
ANION GAP: 5 (ref 5–15)
BUN: 10 mg/dL (ref 6–20)
CO2: 30 mmol/L (ref 22–32)
Calcium: 8.3 mg/dL — ABNORMAL LOW (ref 8.9–10.3)
Chloride: 105 mmol/L (ref 101–111)
Creatinine, Ser: 0.44 mg/dL (ref 0.44–1.00)
GFR calc non Af Amer: >60 mL/min (ref >60)
Glucose, Bld: 103 mg/dL — ABNORMAL HIGH (ref 65–99)
POTASSIUM: 3.5 mmol/L (ref 3.5–5.1)
Sodium: 140 mmol/L (ref 135–145)

## 2015-07-07 LAB — CBC WITH DIFFERENTIAL/PLATELET
BASOS ABS: 0.1 10*3/uL (ref 0–0.1)
BASOS PCT: 1 %
Eosinophils Absolute: 0 10*3/uL (ref 0–0.7)
Eosinophils Relative: 1 %
HEMATOCRIT: 39.9 % (ref 35.0–47.0)
HEMOGLOBIN: 13.6 g/dL (ref 12.0–16.0)
Lymphocytes Relative: 45 %
Lymphs Abs: 2.3 10*3/uL (ref 1.0–3.6)
MCH: 34.8 pg — ABNORMAL HIGH (ref 26.0–34.0)
MCHC: 34.1 g/dL (ref 32.0–36.0)
MCV: 102.1 fL — ABNORMAL HIGH (ref 80.0–100.0)
MONOS PCT: 10 %
Monocytes Absolute: 0.5 10*3/uL (ref 0.2–0.9)
NEUTROS ABS: 2.2 10*3/uL (ref 1.4–6.5)
NEUTROS PCT: 43 %
Platelets: 176 10*3/uL (ref 150–440)
RBC: 3.91 MIL/uL (ref 3.80–5.20)
RDW: 13.6 % (ref 11.5–14.5)
WBC: 5 10*3/uL (ref 3.6–11.0)

## 2015-07-07 LAB — URINALYSIS COMPLETE WITH MICROSCOPIC (ARMC ONLY)
Bilirubin Urine: NEGATIVE
Glucose, UA: NEGATIVE mg/dL
HGB URINE DIPSTICK: NEGATIVE
Ketones, ur: NEGATIVE mg/dL
LEUKOCYTES UA: NEGATIVE
NITRITE: NEGATIVE
Protein, ur: NEGATIVE mg/dL
Specific Gravity, Urine: 1.006 (ref 1.005–1.030)
pH: 5 (ref 5.0–8.0)

## 2015-07-07 NOTE — ED Provider Notes (Signed)
[facility] Emergency Department Provider Note    ____________________________________________  Time seen: 1650  I have reviewed the triage vital signs and the nursing notes.   HISTORY  Chief Complaint Addiction Problem   History limited by: Not Limited   HPI Kristine Macias is a 29 y.o. female brought in by EMS because of mother's concern for intermittent responsiveness today. Per the EMS report there was some concern that the patient took more Ativan than normal and also has recently drank alcohol. The mother states she has been intermittently responsive today. Called EMS when the mother was not able to wake the patient. The patient states however that she came here today because of a seizure. She thinks it lasted 10 minutes. It was unwitnessed. Patient did state that she was incontinent of urine however did not have any mucosal lacerations. The patient states that she has seizures occasionally when she does not take her Ativan. She states her last Ativan use was yesterday. She usually takes 2 Ativan in the morning.   Past Medical History  Diagnosis Date  . Anxiety   . Chlamydia 2007  . Irregular heart beat 2010    wt/diet related after evaluation  . Renal disorder   . Pneumothorax, spontaneous, tension   . Alcohol abuse   . Pancreas divisum   . Failure to thrive in adult     Patient Active Problem List   Diagnosis Date Noted  . Withdrawal seizures ([place]) 07/01/2015  . 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 Drug withdrawal seizure ([place]) 06/21/2015  . Chest pain 05/20/2015  . GAD (generalized anxiety disorder) 04/17/2015  . Panic disorder 04/17/2015  . Alcohol withdrawal ([place]) 04/16/2015  . Abdominal pain, generalized 04/16/2015  . Vomiting and diarrhea   . Failure to thrive in adult   . Intractable nausea and vomiting 03/01/2015  . Hypokalemia 03/01/2015  . Protein-calorie malnutrition, severe ([place]) 01/05/2015  . Alcohol abuse   .  Generalized anxiety disorder 11/26/2014    Class: Chronic  . Alcohol use disorder, severe, dependence ([place]) 11/25/2014  . Adnexal mass 04/10/2012    Past Surgical History  Procedure Laterality Date  . Cesarean section    . Chest tube insertion    . Esophagogastroduodenoscopy N/A 01/06/2015    Procedure: ESOPHAGOGASTRODUODENOSCOPY (EGD);  Surgeon: Josefine Class, MD;  Location: [facility] ENDOSCOPY;  Service: Endoscopy;  Laterality: N/A;  . Colonoscopy with propofol N/A 01/06/2015    Procedure: COLONOSCOPY WITH PROPOFOL;  Surgeon: Josefine Class, MD;  Location: [facility] ENDOSCOPY;  Service: Endoscopy;  Laterality: N/A;  . Peg placement N/A 01/11/2015    Procedure: PERCUTANEOUS ENDOSCOPIC GASTROSTOMY (PEG) PLACEMENT;  Surgeon: Josefine Class, MD;  Location: [facility] ENDOSCOPY;  Service: Endoscopy;  Laterality: N/A;  . Esophagogastroduodenoscopy N/A 01/11/2015    Rein-normal with PEG placement    Current Outpatient Rx  Name  Route  Sig  Dispense  Refill  . folic acid (FOLVITE) 1 MG tablet   Oral   Take 1 tablet (1 mg total) by mouth daily.   30 tablet   0   . LORazepam (ATIVAN) 1 MG tablet   Oral   Take 1 tablet (1 mg total) by mouth every 6 (six) hours as needed for anxiety.   32 tablet   0   . Magnesium Oxide 400 (240 MG) MG TABS   Oral   Take 400 mg by mouth 2 (two) times daily.   60 tablet   0   . Multiple Vitamin (MULTIVITAMIN WITH  MINERALS) TABS tablet   Oral   Take 1 tablet by mouth daily.         . Nutritional Supplements (FEEDING SUPPLEMENT, JEVITY 1.5 CAL/FIBER,) LIQD   Per Tube   Place 240 mLs into feeding tube 4 (four) times daily. Patient not taking: Reported on 06/21/2015   28800 mL   1   . Nutritional Supplements (FEEDING SUPPLEMENT, JEVITY 1.5 CAL/FIBER,) LIQD   Per Tube   Place 237 mLs into feeding tube 4 (four) times daily.   30000 mL   0   . omeprazole (PRILOSEC) 20 MG capsule   Oral   Take 20 mg by mouth daily.         . ondansetron  (ZOFRAN) 4 MG tablet   Oral   Take 4 mg by mouth every 6 (six) hours as needed for nausea or vomiting.          . potassium chloride (K-DUR) 10 MEQ tablet   Oral   Take 1 tablet (10 mEq total) by mouth 2 (two) times daily.   60 tablet   0   . thiamine 100 MG tablet   Oral   Take 1 tablet (100 mg total) by mouth daily.   7 tablet   0   . Water For Irrigation, Sterile (FREE WATER) SOLN   Per Tube   Place 200 mLs into feeding tube every 8 (eight) hours. Patient not taking: Reported on 06/21/2015   1000 mL   5     Allergies Aspirin; Effexor; Sulfa antibiotics; Tetracyclines & related; Trazodone and nefazodone; Latex; Paxil; Seroquel; and Zoloft  Family History  Problem Relation Age of Onset  . Anesthesia problems Neg Hx   . Thyroid disease Mother   . Cancer Father   . Stroke Other   . Crohn's disease Brother     Social History Social History  Substance Use Topics  . Smoking status: Current Every Day Smoker -- 2.00 packs/day    Types: Cigarettes  . Smokeless tobacco: Never Used  . Alcohol Use: 21.6 oz/week    36 Cans of beer per week     Comment: 6 beers/day; trying to quit, participating in [place]    Review of Systems  Constitutional: Negative for fever. Cardiovascular: Negative for chest pain. Respiratory: Negative for shortness of breath. Gastrointestinal: Negative for abdominal pain, vomiting and diarrhea. Genitourinary: Negative for dysuria. Musculoskeletal: Negative for back pain. Skin: Negative for rash. Neurological: Negative for headaches, focal weakness or numbness.  10-point ROS otherwise negative.  ____________________________________________   PHYSICAL EXAM:  VITAL SIGNS: ED Triage Vitals  Enc Vitals Group     BP 07/07/15 1654 119/88 mmHg     Pulse Rate 07/07/15 1654 75     Resp --      Temp 07/07/15 1654 98.1 F (36.7 C)     Temp Source 07/07/15 1654 Oral     SpO2 07/07/15 1654 97 %     Weight 07/07/15 1654 87 lb (39.463 kg)      Height 07/07/15 1654 5' 6"  (1.676 m)   Constitutional: Alert and oriented. In no acute distress. Chronically ill-appearing. Cachectic Eyes: Conjunctivae are normal. PERRL. Normal extraocular movements. ENT   Head: Normocephalic and atraumatic.   Nose: No congestion/rhinnorhea.   Mouth/Throat: Mucous membranes are moist.   Neck: No stridor. Hematological/Lymphatic/Immunilogical: No cervical lymphadenopathy. Cardiovascular: Normal rate, regular rhythm.  No murmurs, rubs, or gallops. Respiratory: Normal respiratory effort without tachypnea nor retractions. Breath sounds are clear and equal bilaterally. No wheezes/rales/rhonchi.  Gastrointestinal: Soft and nontender. No distention.  Genitourinary: Deferred Musculoskeletal: Normal range of motion in all extremities. No joint effusions.  No lower extremity tenderness nor edema. Neurologic:  Normal speech and language. No gross focal neurologic deficits are appreciated.  Skin:  Skin is warm, dry and intact. No rash noted. Psychiatric: Mood and affect are normal. Speech and behavior are normal. Patient exhibits appropriate insight and judgment.  ____________________________________________    LABS (pertinent positives/negatives)  Labs Reviewed  CBC WITH DIFFERENTIAL/PLATELET - Abnormal; Notable for the following:    MCV 102.1 (*)    MCH 34.8 (*)    All other components within normal limits  BASIC METABOLIC PANEL - Abnormal; Notable for the following:    Glucose, Bld 103 (*)    Calcium 8.3 (*)    All other components within normal limits  URINALYSIS COMPLETEWITH MICROSCOPIC ([facility] ONLY) - Abnormal; Notable for the following:    Color, Urine STRAW (*)    APPearance CLEAR (*)    Bacteria, UA RARE (*)    Squamous Epithelial / LPF 0-5 (*)    All other components within normal limits     ____________________________________________   EKG  I, Nance Pear, attending physician, personally viewed and interpreted this  EKG  EKG Time: 1648 Rate: 81 Rhythm: normal sinus rhythm Axis: normal Intervals: qtc 476 QRS: narrow, rsr' in V3 ST changes: no st elevation Impression: borderline ekg   ____________________________________________    RADIOLOGY  None   ____________________________________________   PROCEDURES  Procedure(s) performed: None  Critical Care performed: No  ____________________________________________   INITIAL IMPRESSION / ASSESSMENT AND PLAN / ED COURSE  Pertinent labs & imaging results that were available during my care of the patient were reviewed by me and considered in my medical decision making (see chart for details).  Patient presented to the emergency department today with a potential seizure versus decreased responsiveness likely secondary to Ativan and alcohol use. The patient unfortunately has had multiple visits to the emergency department and admissions for similar symptoms in the past couple of months. On exam here patient is awake and alert in no acute distress. No seizure activity. I did discuss with the patient the importance of her trying to stop her alcohol use. She intermittently goes to her [place] meetings. I also discussed the importance of following up with primary care doctor. I hope that the patient will be successful and her attempted sprightly and effectively weaned off of the Ativan.  ____________________________________________   FINAL CLINICAL IMPRESSION(S) / ED DIAGNOSES  Final diagnoses:  Alcohol abuse  Seizure-like activity ([place])     Nance Pear, MD 07/07/15 1920

## 2015-07-07 NOTE — ED Notes (Signed)
Per patient she may have had another sz today which was unwitnessed.  Denies taking additional ativan.

## 2015-07-07 NOTE — ED Notes (Signed)
Resting quietly   Speech is clear .Marland Kitchen Denies any complaints at present

## 2015-07-07 NOTE — ED Notes (Signed)

## 2015-07-07 NOTE — ED Notes (Signed)
Spoke with Mother . She is concerned of her having massive mood swings or sleep all day. Dr Archie Balboa aware.

## 2015-07-07 NOTE — ED Notes (Signed)
Brought in from home via ems. Per ems mother thought she may have taken about 8-9 ativan today and drank etoh.  States she was in and out of responsiveness at home

## 2015-07-07 NOTE — Discharge Instructions (Signed)
Please seek medical attention for any high fevers, chest pain, shortness of breath, change in behavior, persistent vomiting, bloody stool or any other new or concerning symptoms.   Alcohol Use Disorder Alcohol use disorder is a mental disorder. It is not a one-time incident of heavy drinking. Alcohol use disorder is the excessive and uncontrollable use of alcohol over time that leads to problems with functioning in one or more areas of daily living. People with this disorder risk harming themselves and others when they drink to excess. Alcohol use disorder also can cause other mental disorders, such as mood and anxiety disorders, and serious physical problems. People with alcohol use disorder often misuse other drugs.  Alcohol use disorder is common and widespread. Some people with this disorder drink alcohol to cope with or escape from negative life events. Others drink to relieve chronic pain or symptoms of mental illness. People with a family history of alcohol use disorder are at higher risk of losing control and using alcohol to excess.  Drinking too much alcohol can cause injury, accidents, and health problems. One drink can be too much when you are:  Working.  Pregnant or breastfeeding.  Taking medicines. Ask your doctor.  Driving or planning to drive. SYMPTOMS  Signs and symptoms of alcohol use disorder may include the following:   Consumption ofalcohol inlarger amounts or over a longer period of time than intended.  Multiple unsuccessful attempts to cutdown or control alcohol use.   A great deal of time spent obtaining alcohol, using alcohol, or recovering from the effects of alcohol (hangover).  A strong desire or urge to use alcohol (cravings).   Continued use of alcohol despite problems at work, school, or home because of alcohol use.   Continued use of alcohol despite problems in relationships because of alcohol use.  Continued use of alcohol in situations when it is  physically hazardous, such as driving a car.  Continued use of alcohol despite awareness of a physical or psychological problem that is likely related to alcohol use. Physical problems related to alcohol use can involve the brain, heart, liver, stomach, and intestines. Psychological problems related to alcohol use include intoxication, depression, anxiety, psychosis, delirium, and dementia.   The need for increased amounts of alcohol to achieve the same desired effect, or a decreased effect from the consumption of the same amount of alcohol (tolerance).  Withdrawal symptoms upon reducing or stopping alcohol use, or alcohol use to reduce or avoid withdrawal symptoms. Withdrawal symptoms include:  Racing heart.  Hand tremor.  Difficulty sleeping.  Nausea.  Vomiting.  Hallucinations.  Restlessness.  Seizures. DIAGNOSIS Alcohol use disorder is diagnosed through an assessment by your health care provider. Your health care provider may start by asking three or four questions to screen for excessive or problematic alcohol use. To confirm a diagnosis of alcohol use disorder, at least two symptoms must be present within a 83-monthperiod. The severity of alcohol use disorder depends on the number of symptoms:  Mild--two or three.  Moderate--four or five.  Severe--six or more. Your health care provider may perform a physical exam or use results from lab tests to see if you have physical problems resulting from alcohol use. Your health care provider may refer you to a mental health professional for evaluation. TREATMENT  Some people with alcohol use disorder are able to reduce their alcohol use to low-risk levels. Some people with alcohol use disorder need to quit drinking alcohol. When necessary, mental health professionals with specialized  training in substance use treatment can help. Your health care provider can help you decide how severe your alcohol use disorder is and what type of  treatment you need. The following forms of treatment are available:   Detoxification. Detoxification involves the use of prescription medicines to prevent alcohol withdrawal symptoms in the first week after quitting. This is important for people with a history of symptoms of withdrawal and for heavy drinkers who are likely to have withdrawal symptoms. Alcohol withdrawal can be dangerous and, in severe cases, cause death. Detoxification is usually provided in a hospital or in-patient substance use treatment facility.  Counseling or talk therapy. Talk therapy is provided by substance use treatment counselors. It addresses the reasons people use alcohol and ways to keep them from drinking again. The goals of talk therapy are to help people with alcohol use disorder find healthy activities and ways to cope with life stress, to identify and avoid triggers for alcohol use, and to handle cravings, which can cause relapse.  Medicines.Different medicines can help treat alcohol use disorder through the following actions:  Decrease alcohol cravings.  Decrease the positive reward response felt from alcohol use.  Produce an uncomfortable physical reaction when alcohol is used (aversion therapy).  Support groups. Support groups are run by people who have quit drinking. They provide emotional support, advice, and guidance. These forms of treatment are often combined. Some people with alcohol use disorder benefit from intensive combination treatment provided by specialized substance use treatment centers. Both inpatient and outpatient treatment programs are available.   This information is not intended to replace advice given to you by your health care provider. Make sure you discuss any questions you have with your health care provider.   Document Released: 09/20/2004 Document Revised: 09/03/2014 Document Reviewed: 11/20/2012 Elsevier Interactive Patient Education [employer].

## 2015-07-09 ENCOUNTER — Observation Stay (HOSPITAL_COMMUNITY): Payer: Medicaid Other

## 2015-07-09 ENCOUNTER — Encounter (HOSPITAL_COMMUNITY): Payer: Self-pay | Admitting: Emergency Medicine

## 2015-07-09 ENCOUNTER — Inpatient Hospital Stay (HOSPITAL_COMMUNITY)
Admission: EM | Admit: 2015-07-09 | Discharge: 2015-07-14 | DRG: 100 | Disposition: A | Discharge status: Home / Self Care | Payer: Medicaid Other | Attending: Internal Medicine | Admitting: Internal Medicine

## 2015-07-09 DIAGNOSIS — F17210 Nicotine dependence, cigarettes, uncomplicated: Secondary | ICD-10-CM | POA: Diagnosis present

## 2015-07-09 DIAGNOSIS — F1010 Alcohol abuse, uncomplicated: Secondary | ICD-10-CM | POA: Diagnosis present

## 2015-07-09 DIAGNOSIS — E876 Hypokalemia: Secondary | ICD-10-CM | POA: Diagnosis present

## 2015-07-09 DIAGNOSIS — F19239 Other psychoactive substance dependence with withdrawal, unspecified: Secondary | ICD-10-CM

## 2015-07-09 DIAGNOSIS — F10120 Alcohol abuse with intoxication, uncomplicated: Secondary | ICD-10-CM

## 2015-07-09 DIAGNOSIS — Z823 Family history of stroke: Secondary | ICD-10-CM

## 2015-07-09 DIAGNOSIS — Z881 Allergy status to other antibiotic agents status: Secondary | ICD-10-CM

## 2015-07-09 DIAGNOSIS — Z681 Body mass index (BMI) 19 or less, adult: Secondary | ICD-10-CM

## 2015-07-09 DIAGNOSIS — T424X6A Underdosing of benzodiazepines, initial encounter: Secondary | ICD-10-CM | POA: Diagnosis present

## 2015-07-09 DIAGNOSIS — R569 Unspecified convulsions: Principal | ICD-10-CM | POA: Diagnosis present

## 2015-07-09 DIAGNOSIS — Z886 Allergy status to analgesic agent status: Secondary | ICD-10-CM

## 2015-07-09 DIAGNOSIS — Z882 Allergy status to sulfonamides status: Secondary | ICD-10-CM

## 2015-07-09 DIAGNOSIS — K7010 Alcoholic hepatitis without ascites: Secondary | ICD-10-CM | POA: Diagnosis not present

## 2015-07-09 DIAGNOSIS — F19230 Other psychoactive substance dependence with withdrawal, uncomplicated: Secondary | ICD-10-CM | POA: Diagnosis not present

## 2015-07-09 DIAGNOSIS — E871 Hypo-osmolality and hyponatremia: Secondary | ICD-10-CM | POA: Diagnosis present

## 2015-07-09 DIAGNOSIS — F19939 Other psychoactive substance use, unspecified with withdrawal, unspecified: Secondary | ICD-10-CM

## 2015-07-09 DIAGNOSIS — R627 Adult failure to thrive: Secondary | ICD-10-CM | POA: Diagnosis present

## 2015-07-09 DIAGNOSIS — Z79899 Other long term (current) drug therapy: Secondary | ICD-10-CM

## 2015-07-09 DIAGNOSIS — K7011 Alcoholic hepatitis with ascites: Secondary | ICD-10-CM | POA: Diagnosis present

## 2015-07-09 DIAGNOSIS — F10239 Alcohol dependence with withdrawal, unspecified: Secondary | ICD-10-CM | POA: Diagnosis present

## 2015-07-09 DIAGNOSIS — Y907 Blood alcohol level of 200-239 mg/100 ml: Secondary | ICD-10-CM | POA: Diagnosis present

## 2015-07-09 DIAGNOSIS — Z809 Family history of malignant neoplasm, unspecified: Secondary | ICD-10-CM

## 2015-07-09 DIAGNOSIS — E43 Unspecified severe protein-calorie malnutrition: Secondary | ICD-10-CM | POA: Diagnosis present

## 2015-07-09 DIAGNOSIS — D696 Thrombocytopenia, unspecified: Secondary | ICD-10-CM | POA: Diagnosis present

## 2015-07-09 DIAGNOSIS — Z91040 Latex allergy status: Secondary | ICD-10-CM

## 2015-07-09 DIAGNOSIS — R509 Fever, unspecified: Secondary | ICD-10-CM

## 2015-07-09 DIAGNOSIS — F10229 Alcohol dependence with intoxication, unspecified: Secondary | ICD-10-CM | POA: Diagnosis present

## 2015-07-09 DIAGNOSIS — Z888 Allergy status to other drugs, medicaments and biological substances status: Secondary | ICD-10-CM

## 2015-07-09 DIAGNOSIS — Z91128 Patient's intentional underdosing of medication regimen for other reason: Secondary | ICD-10-CM

## 2015-07-09 DIAGNOSIS — Z931 Gastrostomy status: Secondary | ICD-10-CM

## 2015-07-09 DIAGNOSIS — F10929 Alcohol use, unspecified with intoxication, unspecified: Secondary | ICD-10-CM | POA: Diagnosis present

## 2015-07-09 LAB — EXPECTORATED SPUTUM ASSESSMENT W REFEX TO RESP CULTURE

## 2015-07-09 LAB — MRSA PCR SCREENING: MRSA by PCR: NEGATIVE

## 2015-07-09 LAB — COMPREHENSIVE METABOLIC PANEL
ALT: 137 U/L — ABNORMAL HIGH (ref 14–54)
ANION GAP: 8 (ref 5–15)
AST: 470 U/L — ABNORMAL HIGH (ref 15–41)
Albumin: 3.3 g/dL — ABNORMAL LOW (ref 3.5–5.0)
Alkaline Phosphatase: 205 U/L — ABNORMAL HIGH (ref 38–126)
BILIRUBIN TOTAL: 0.6 mg/dL (ref 0.3–1.2)
BUN: 12 mg/dL (ref 6–20)
CO2: 29 mmol/L (ref 22–32)
Calcium: 8.1 mg/dL — ABNORMAL LOW (ref 8.9–10.3)
Chloride: 100 mmol/L — ABNORMAL LOW (ref 101–111)
Creatinine, Ser: 0.54 mg/dL (ref 0.44–1.00)
Glucose, Bld: 86 mg/dL (ref 65–99)
POTASSIUM: 3.7 mmol/L (ref 3.5–5.1)
Sodium: 137 mmol/L (ref 135–145)
TOTAL PROTEIN: 7.5 g/dL (ref 6.5–8.1)

## 2015-07-09 LAB — RAPID URINE DRUG SCREEN, HOSP PERFORMED
AMPHETAMINES: NOT DETECTED
BENZODIAZEPINES: POSITIVE — AB
Barbiturates: NOT DETECTED
Cocaine: NOT DETECTED
Opiates: NOT DETECTED
Tetrahydrocannabinol: NOT DETECTED

## 2015-07-09 LAB — CBC
HEMATOCRIT: 41.7 % (ref 36.0–46.0)
Hemoglobin: 14 g/dL (ref 12.0–15.0)
MCH: 34.5 pg — AB (ref 26.0–34.0)
MCHC: 33.6 g/dL (ref 30.0–36.0)
MCV: 102.7 fL — AB (ref 78.0–100.0)
Platelets: 192 10*3/uL (ref 150–400)
RBC: 4.06 MIL/uL (ref 3.87–5.11)
RDW: 12.8 % (ref 11.5–15.5)
WBC: 4.1 10*3/uL (ref 4.0–10.5)

## 2015-07-09 LAB — EXPECTORATED SPUTUM ASSESSMENT W GRAM STAIN, RFLX TO RESP C

## 2015-07-09 LAB — ETHANOL: Alcohol, Ethyl (B): 220 mg/dL — ABNORMAL HIGH (ref <5)

## 2015-07-09 LAB — CBG MONITORING, ED
Glucose-Capillary: 77 mg/dL (ref 65–99)
Glucose-Capillary: 85 mg/dL (ref 65–99)

## 2015-07-09 MED ORDER — ENOXAPARIN SODIUM 30 MG/0.3ML ~~LOC~~ SOLN
30.0000 mg | SUBCUTANEOUS | Status: DC
  Administered 2015-07-09 – 2015-07-12 (×4): 30 mg via SUBCUTANEOUS
  Filled 2015-07-09 (×7): qty 0.3

## 2015-07-09 MED ORDER — THIAMINE HCL 100 MG/ML IJ SOLN: 100.0000 mg | Freq: Every day | INTRAMUSCULAR | Status: DC

## 2015-07-09 MED ORDER — ADULT MULTIVITAMIN W/MINERALS CH
1.0000 | ORAL_TABLET | Freq: Every day | ORAL | Status: DC
  Administered 2015-07-09 – 2015-07-14 (×6): 1 via ORAL
  Filled 2015-07-09 (×7): qty 1

## 2015-07-09 MED ORDER — ADULT MULTIVITAMIN W/MINERALS CH
1.0000 | ORAL_TABLET | Freq: Every day | ORAL | Status: DC
  Filled 2015-07-09: qty 1

## 2015-07-09 MED ORDER — THIAMINE HCL 100 MG/ML IJ SOLN
100.0000 mg | Freq: Once | INTRAMUSCULAR | Status: AC
  Administered 2015-07-09: 100 mg via INTRAVENOUS
  Filled 2015-07-09: qty 2

## 2015-07-09 MED ORDER — VITAMIN B-1 100 MG PO TABS: 100.0000 mg | ORAL_TABLET | Freq: Every day | ORAL | Status: DC

## 2015-07-09 MED ORDER — LORAZEPAM 2 MG/ML IJ SOLN: 1.0000 mg | Freq: Four times a day (QID) | INTRAMUSCULAR | Status: DC | PRN

## 2015-07-09 MED ORDER — LORAZEPAM 2 MG/ML IJ SOLN
1.0000 mg | Freq: Once | INTRAMUSCULAR | Status: AC
  Administered 2015-07-09: 1 mg via INTRAVENOUS
  Filled 2015-07-09: qty 1

## 2015-07-09 MED ORDER — FOLIC ACID 1 MG PO TABS
1.0000 mg | ORAL_TABLET | Freq: Every day | ORAL | Status: DC
  Administered 2015-07-09 – 2015-07-14 (×6): 1 mg via ORAL
  Filled 2015-07-09 (×7): qty 1

## 2015-07-09 MED ORDER — THIAMINE HCL 100 MG/ML IJ SOLN
100.0000 mg | Freq: Every day | INTRAMUSCULAR | Status: DC
  Filled 2015-07-09: qty 1

## 2015-07-09 MED ORDER — FOLIC ACID 5 MG/ML IJ SOLN
1.0000 mg | Freq: Every day | INTRAMUSCULAR | Status: DC
  Filled 2015-07-09 (×3): qty 0.2

## 2015-07-09 MED ORDER — VITAMIN B-1 100 MG PO TABS
100.0000 mg | ORAL_TABLET | Freq: Every day | ORAL | Status: DC
Start: 2015-07-10 — End: 2015-07-14
  Administered 2015-07-10 – 2015-07-14 (×5): 100 mg via ORAL
  Filled 2015-07-09 (×5): qty 1

## 2015-07-09 MED ORDER — LIP MEDEX EX OINT: TOPICAL_OINTMENT | CUTANEOUS | Status: DC | PRN

## 2015-07-09 MED ORDER — ONDANSETRON HCL 4 MG PO TABS: 4.0000 mg | ORAL_TABLET | Freq: Four times a day (QID) | ORAL | Status: DC | PRN

## 2015-07-09 MED ORDER — LORAZEPAM 1 MG PO TABS: 1.0000 mg | ORAL_TABLET | Freq: Four times a day (QID) | ORAL | Status: DC | PRN

## 2015-07-09 MED ORDER — LORAZEPAM 2 MG/ML IJ SOLN
INTRAMUSCULAR | Status: AC
  Administered 2015-07-09: 2 mg via INTRAVENOUS
  Filled 2015-07-09: qty 1

## 2015-07-09 MED ORDER — LORAZEPAM 2 MG/ML IJ SOLN
0.0000 mg | Freq: Four times a day (QID) | INTRAMUSCULAR | Status: DC
  Administered 2015-07-09: 2 mg via INTRAVENOUS

## 2015-07-09 MED ORDER — LORAZEPAM 2 MG/ML IJ SOLN: 0.0000 mg | Freq: Two times a day (BID) | INTRAMUSCULAR | Status: DC

## 2015-07-09 MED ORDER — ONDANSETRON HCL 4 MG/2ML IJ SOLN: 4.0000 mg | Freq: Four times a day (QID) | INTRAMUSCULAR | Status: DC | PRN

## 2015-07-09 MED ORDER — SODIUM CHLORIDE 0.9 % IV SOLN
Freq: Once | INTRAVENOUS | Status: AC
  Administered 2015-07-09: 17:00:00 via INTRAVENOUS

## 2015-07-09 MED ORDER — SODIUM CHLORIDE 0.9 % IV BOLUS (SEPSIS)
1000.0000 mL | Freq: Once | INTRAVENOUS | Status: AC
  Administered 2015-07-09: 1000 mL via INTRAVENOUS

## 2015-07-09 MED ORDER — LORAZEPAM 2 MG/ML IJ SOLN
2.0000 mg | INTRAMUSCULAR | Status: DC | PRN
  Administered 2015-07-09: 3 mg via INTRAVENOUS
  Administered 2015-07-09 – 2015-07-14 (×27): 2 mg via INTRAVENOUS
  Filled 2015-07-09 (×20): qty 1
  Filled 2015-07-09: qty 2
  Filled 2015-07-09 (×8): qty 1

## 2015-07-09 MED ORDER — LORAZEPAM 1 MG PO TABS: 0.0000 mg | ORAL_TABLET | Freq: Two times a day (BID) | ORAL | Status: DC

## 2015-07-09 MED ORDER — SODIUM CHLORIDE 0.9 % IJ SOLN
3.0000 mL | Freq: Two times a day (BID) | INTRAMUSCULAR | Status: DC
  Administered 2015-07-10 – 2015-07-14 (×5): 3 mL via INTRAVENOUS

## 2015-07-09 MED ORDER — DEXTROSE-NACL 5-0.9 % IV SOLN
INTRAVENOUS | Status: DC
  Administered 2015-07-09: 17:00:00 via INTRAVENOUS

## 2015-07-09 MED ORDER — PANTOPRAZOLE SODIUM 40 MG PO TBEC
40.0000 mg | DELAYED_RELEASE_TABLET | Freq: Every day | ORAL | Status: DC
  Administered 2015-07-10 – 2015-07-14 (×5): 40 mg via ORAL
  Filled 2015-07-09 (×5): qty 1

## 2015-07-09 MED ORDER — SODIUM CHLORIDE 0.9 % IV SOLN
INTRAVENOUS | Status: DC
  Administered 2015-07-09: 19:00:00 via INTRAVENOUS
  Administered 2015-07-10: 1000 mL via INTRAVENOUS
  Administered 2015-07-10: 03:00:00 via INTRAVENOUS
  Administered 2015-07-10: 150 mL/h via INTRAVENOUS
  Administered 2015-07-11 – 2015-07-13 (×3): via INTRAVENOUS

## 2015-07-09 MED ORDER — LORAZEPAM 1 MG PO TABS: 0.0000 mg | ORAL_TABLET | Freq: Four times a day (QID) | ORAL | Status: DC

## 2015-07-09 NOTE — ED Notes (Signed)
Phlebotomy at bedside.

## 2015-07-09 NOTE — ED Notes (Signed)
Per EMS pt four witnessed seizure per mother within ten minutes. Pt states has seizures when stopping ativan. Pt reports normally takes 4-6 of her ativan every day and has not had for a day now. Pt reports last ETOH use three beers last night 2200.

## 2015-07-09 NOTE — ED Notes (Signed)
Dr Zenia Resides made aware of CIWA score.

## 2015-07-09 NOTE — Progress Notes (Signed)
Transferring patient off unit to Room 1229.  Patient alert and oriented, room air, IV intact and fluids infusing. Chart and medications being taken at transfer for patient.

## 2015-07-09 NOTE — ED Notes (Signed)
MD at bedside. 

## 2015-07-09 NOTE — Progress Notes (Signed)
Patient has significant tremors.  No orders for ativan.  Physician arrives on floor and looks at patient.  Orders put in by physician for medication and transfer.  Overrode Pyxis for 16m ativan.  Patient being transferred per physician order.  Patient alert & oriented.  Room air.

## 2015-07-09 NOTE — H&P (Addendum)
[facility] History and Physical  Aerielle Stoklosa EGB:[id number] DOB: 1986-08-17 DOA: 07/09/2015  Referring physician: ED physician, Alfonse Spruce  PCP: Leonides Sake, MD   Chief Complaint: alcohol intoxication   HPI:  29 y.o. 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 The patient reported that she ran out of ativan one day PTA and mother was with her when she reported episode of seizure earlier in the day piror to this admission. Pt apparently takes 2 mg Ativan 6 times per day. She reports her last alcoholic beverage one day PTA. Pt denies chest pain or shortness of breath, no abd or urinary concerns.   In ED, pt noted to be hemodynamically stable, blood work notable for alcohol level > 200. TRH asked to admit for observation.   Assessment and Plan: Active Problems:   Alcoholic intoxication - with ongoing shaking, needs stepdown unit admission  - provide supportive care with IVF - keep on CIWA protocol  - repeat alcohol level on AM    Alcohol related seizure - transfer to SDU now, pt with tremors, high risk for seizures - seizure precautions - keep on CIWA protocol     Alcohol abuse with alcoholic hepatitis - repeat CMET    Severe PCM - in the setting of alcohol abuse - Body mass index is 13.89 kg/(m^2). - nutritionist consulted   Lovenox for DVT prophylaxis   Radiological Exams on Admission: No results found.   Code Status: Full Family Communication: Pt at bedside Disposition Plan: Admit for further evaluation    Mart Piggs [facility] [phone]   Review of Systems:  Unable to provide due to alcohol intoxication     Past Medical History  Diagnosis Date  . Anxiety   . Chlamydia 2007  . Irregular heart beat 2010    wt/diet related after evaluation  . Renal disorder   . Pneumothorax, spontaneous, tension   . Alcohol abuse   . Pancreas  divisum   . Failure to thrive in adult     Past Surgical History  Procedure Laterality Date  . Cesarean section    . Chest tube insertion    . Esophagogastroduodenoscopy N/A 01/06/2015    Procedure: ESOPHAGOGASTRODUODENOSCOPY (EGD);  Surgeon: Josefine Class, MD;  Location: [facility] ENDOSCOPY;  Service: Endoscopy;  Laterality: N/A;  . Colonoscopy with propofol N/A 01/06/2015    Procedure: COLONOSCOPY WITH PROPOFOL;  Surgeon: Josefine Class, MD;  Location: [facility] ENDOSCOPY;  Service: Endoscopy;  Laterality: N/A;  . Peg placement N/A 01/11/2015    Procedure: PERCUTANEOUS ENDOSCOPIC GASTROSTOMY (PEG) PLACEMENT;  Surgeon: Josefine Class, MD;  Location: [facility] ENDOSCOPY;  Service: Endoscopy;  Laterality: N/A;  . Esophagogastroduodenoscopy N/A 01/11/2015    Rein-normal with PEG placement    Social History:  reports that she has been smoking Cigarettes.  She has been smoking about 2.00 packs per day. She has never used smokeless tobacco. She reports that she drinks about 21.6 oz of alcohol per week. She reports that she does not use illicit drugs.  Allergies  Allergen Reactions  . Aspirin Shortness Of Breath  . Effexor [Venlafaxine] Anaphylaxis  . Sulfa Antibiotics Anaphylaxis  . Tetracyclines & Related Anaphylaxis  . Trazodone And Nefazodone Anaphylaxis  . Latex Hives, Itching and Rash  . Paxil [Paroxetine] Hives, Itching and Rash  . Seroquel [Quetiapine Fumarate] Hives, Itching and Rash  . Zoloft [Sertraline Hcl] Hives, Itching and Rash  Family History  Problem Relation Age of Onset  . Anesthesia problems Neg Hx   . Thyroid disease Mother   . Cancer Father   . Stroke Other   . Crohn's disease Brother     Prior to Admission medications   Medication Sig Start Date End Date Taking? Authorizing Provider  CALCIUM-VITAMIN D PO Take 1 tablet by mouth daily.   Yes Historical Provider, MD  folic acid (FOLVITE) 1 MG tablet Take 1 tablet (1 mg total) by mouth daily. 03/03/15  Yes Demetrios Loll, MD  LORazepam (ATIVAN) 1 MG tablet Take 1 tablet (1 mg total) by mouth every 6 (six) hours as needed for anxiety. 04/18/15  Yes Loletha Grayer, MD  Magnesium Oxide 400 (240 MG) MG TABS Take 400 mg by mouth 2 (two) times daily. 04/18/15  Yes Loletha Grayer, MD  Multiple Vitamin (MULTIVITAMIN WITH MINERALS) TABS tablet Take 1 tablet by mouth daily.   Yes Historical Provider, MD  Nutritional Supplements (FEEDING SUPPLEMENT, JEVITY 1.5 CAL/FIBER,) LIQD Place 237 mLs into feeding tube 4 (four) times daily. 07/02/15  Yes Aldean Jewett, MD  omeprazole (PRILOSEC) 20 MG capsule Take 20 mg by mouth daily.   Yes Historical Provider, MD  ondansetron (ZOFRAN) 4 MG tablet Take 4 mg by mouth every 6 (six) hours as needed for nausea or vomiting.    Yes Historical Provider, MD  potassium chloride (K-DUR) 10 MEQ tablet Take 1 tablet (10 mEq total) by mouth 2 (two) times daily. 04/18/15  Yes Loletha Grayer, MD  thiamine 100 MG tablet Take 1 tablet (100 mg total) by mouth daily. 03/03/15  Yes Demetrios Loll, MD  Nutritional Supplements (FEEDING SUPPLEMENT, JEVITY 1.5 CAL/FIBER,) LIQD Place 240 mLs into feeding tube 4 (four) times daily. Patient not taking: Reported on 06/21/2015 04/18/15   Loletha Grayer, MD  Water For Irrigation, Sterile (FREE WATER) SOLN Place 200 mLs into feeding tube every 8 (eight) hours. Patient not taking: Reported on 06/21/2015 05/23/15   Gladstone Lighter, MD    Physical Exam: Filed Vitals:   07/09/15 1443 07/09/15 1646 07/09/15 1709 07/09/15 1711  BP: 113/83 118/87 126/84 126/84  Pulse: 75 76 77 77  Temp: 98.8 F (37.1 C)  98.7 F (37.1 C)   TempSrc: Temporal  Temporal   Resp: 16  21   Height:      Weight:      SpO2: 98% 97% 97%     Physical Exam  Constitutional: Appears in mild distress due to shakes Cachectic  HENT: Normocephalic. External right and left ear normal. Dry MM Eyes: Conjunctivae and EOM are normal. PERRLA, no scleral icterus.  Neck: Normal ROM. Neck supple. No  JVD. No tracheal deviation. No thyromegaly.  CVS: RRR, S1/S2 +, no murmurs, no gallops, no carotid bruit.  Pulmonary: Effort and breath sounds normal, no stridor, rhonchi, wheezes, rales.  Abdominal: Soft. BS +,  no distension, tenderness, rebound or guarding.  Musculoskeletal: Normal range of motion. No edema and no tenderness.  Lymphadenopathy: No lymphadenopathy noted, cervical, inguinal. Neuro: Alert. Shaking, slow to respond  Skin: Skin is warm and dry. No rash noted. Not diaphoretic. No erythema. No pallor.  Psychiatric: difficult to assess due to alcohol intoxication   Labs on Admission:  Basic Metabolic Panel:  Recent Labs Lab 07/07/15 1653 07/09/15 1248  NA 140 137  K 3.5 3.7  CL 105 100*  CO2 30 29  GLUCOSE 103* 86  BUN 10 12  CREATININE 0.44 0.54  CALCIUM 8.3* 8.1*  Liver Function Tests:  Recent Labs Lab 07/09/15 1248  AST 470*  ALT 137*  ALKPHOS 205*  BILITOT 0.6  PROT 7.5  ALBUMIN 3.3*   CBC:  Recent Labs Lab 07/07/15 1653 07/09/15 1248  WBC 5.0 4.1  NEUTROABS 2.2  --   HGB 13.6 14.0  HCT 39.9 41.7  MCV 102.1* 102.7*  PLT 176 192   CBG:  Recent Labs Lab 07/09/15 1241 07/09/15 1632  GLUCAP 77 85    EKG:  Pending    If 7PM-7AM, please contact night-coverage [email or web address] Password [facility] 07/09/2015, 5:37 PM

## 2015-07-09 NOTE — ED Notes (Signed)
Bed: WA17 Expected date:  Expected time:  Means of arrival:  Comments: Seizure

## 2015-07-09 NOTE — ED Provider Notes (Signed)
CSN: [id number]     Arrival date & time 07/09/15  1228 History   First MD Initiated Contact with Patient 07/09/15 1254     Chief Complaint  Patient presents with  . Seizures     (Consider location/radiation/quality/duration/timing/severity/associated sxs/prior Treatment) HPI Comments: 29 y.o. Female with history of alcoholism, failure to thrive with PEG tube, seizures related to benzo/alcohol withdrawal presents for reported seizure at home. The patient states that yesterday she ran out of Ativan and that today she had a seizure at home and that this often happens even within hours of stopping her ativan.  She reports that she is taking 2 mg of Ativan 6 times a day although it appears she is prescribed 1 mg up to 4 times a day.  She reports that she last had a drink of alcohol yesterday.  Denies fever, chills.  Reports nausea and says she cannot eat anything.   Past Medical History  Diagnosis Date  . Anxiety   . Chlamydia 2007  . Irregular heart beat 2010    wt/diet related after evaluation  . Renal disorder   . Pneumothorax, spontaneous, tension   . Alcohol abuse   . Pancreas divisum   . Failure to thrive in adult    Past Surgical History  Procedure Laterality Date  . Cesarean section    . Chest tube insertion    . Esophagogastroduodenoscopy N/A 01/06/2015    Procedure: ESOPHAGOGASTRODUODENOSCOPY (EGD);  Surgeon: Josefine Class, MD;  Location: [facility] ENDOSCOPY;  Service: Endoscopy;  Laterality: N/A;  . Colonoscopy with propofol N/A 01/06/2015    Procedure: COLONOSCOPY WITH PROPOFOL;  Surgeon: Josefine Class, MD;  Location: [facility] ENDOSCOPY;  Service: Endoscopy;  Laterality: N/A;  . Peg placement N/A 01/11/2015    Procedure: PERCUTANEOUS ENDOSCOPIC GASTROSTOMY (PEG) PLACEMENT;  Surgeon: Josefine Class, MD;  Location: [facility] ENDOSCOPY;  Service: Endoscopy;  Laterality: N/A;  . Esophagogastroduodenoscopy N/A 01/11/2015    Rein-normal with PEG placement   Family History   Problem Relation Age of Onset  . Anesthesia problems Neg Hx   . Thyroid disease Mother   . Cancer Father   . Stroke Other   . Crohn's disease Brother    Social History  Substance Use Topics  . Smoking status: Current Every Day Smoker -- 2.00 packs/day    Types: Cigarettes  . Smokeless tobacco: Never Used  . Alcohol Use: 21.6 oz/week    36 Cans of beer per week     Comment: 6 beers/day; trying to quit, participating in AA   OB History    Gravida Para Term Preterm AB TAB SAB Ectopic Multiple Living   4 2 2  0 2 1 1  0 0 2     Review of Systems  Constitutional: Negative for chills, appetite change and fatigue.  HENT: Negative for congestion, postnasal drip and rhinorrhea.   Respiratory: Negative for cough, chest tightness and shortness of breath.   Cardiovascular: Negative for chest pain and palpitations.  Gastrointestinal: Positive for nausea. Negative for vomiting, abdominal pain and diarrhea.  Genitourinary: Negative for dysuria, urgency and hematuria.  Musculoskeletal: Negative for myalgias and back pain.  Skin: Negative for rash.  Neurological: Positive for tremors and seizures. Negative for dizziness and headaches.  Hematological: Does not bruise/bleed easily.      Allergies  Aspirin; Effexor; Sulfa antibiotics; Tetracyclines & related; Trazodone and nefazodone; Latex; Paxil; Seroquel; and Zoloft  Home Medications   Prior to Admission medications   Medication Sig Start Date End Date Taking?  Authorizing Provider  CALCIUM-VITAMIN D PO Take 1 tablet by mouth daily.   Yes Historical Provider, MD  folic acid (FOLVITE) 1 MG tablet Take 1 tablet (1 mg total) by mouth daily. 03/03/15  Yes Demetrios Loll, MD  LORazepam (ATIVAN) 1 MG tablet Take 1 tablet (1 mg total) by mouth every 6 (six) hours as needed for anxiety. 04/18/15  Yes Loletha Grayer, MD  Magnesium Oxide 400 (240 MG) MG TABS Take 400 mg by mouth 2 (two) times daily. 04/18/15  Yes Loletha Grayer, MD  Multiple Vitamin  (MULTIVITAMIN WITH MINERALS) TABS tablet Take 1 tablet by mouth daily.   Yes Historical Provider, MD  Nutritional Supplements (FEEDING SUPPLEMENT, JEVITY 1.5 CAL/FIBER,) LIQD Place 237 mLs into feeding tube 4 (four) times daily. 07/02/15  Yes Aldean Jewett, MD  omeprazole (PRILOSEC) 20 MG capsule Take 20 mg by mouth daily.   Yes Historical Provider, MD  ondansetron (ZOFRAN) 4 MG tablet Take 4 mg by mouth every 6 (six) hours as needed for nausea or vomiting.    Yes Historical Provider, MD  potassium chloride (K-DUR) 10 MEQ tablet Take 1 tablet (10 mEq total) by mouth 2 (two) times daily. 04/18/15  Yes Loletha Grayer, MD  thiamine 100 MG tablet Take 1 tablet (100 mg total) by mouth daily. 03/03/15  Yes Demetrios Loll, MD  Nutritional Supplements (FEEDING SUPPLEMENT, JEVITY 1.5 CAL/FIBER,) LIQD Place 240 mLs into feeding tube 4 (four) times daily. Patient not taking: Reported on 06/21/2015 04/18/15   Loletha Grayer, MD  Water For Irrigation, Sterile (FREE WATER) SOLN Place 200 mLs into feeding tube every 8 (eight) hours. Patient not taking: Reported on 06/21/2015 05/23/15   Gladstone Lighter, MD   BP 126/84 mmHg  Pulse 77  Temp(Src) 98.7 F (37.1 C) (Temporal)  Resp 21  Ht 5' 6"  (1.676 m)  Wt 86 lb (39.009 kg)  BMI 13.89 kg/m2  SpO2 97% Physical Exam  Constitutional: She is oriented to person, place, and time. She appears well-developed. She appears cachectic. No distress.  HENT:  Head: Normocephalic and atraumatic.  Right Ear: External ear normal.  Left Ear: External ear normal.  Nose: Nose normal.  Mouth/Throat: Oropharynx is clear and moist. No oropharyngeal exudate.  Eyes: EOM are normal. Pupils are equal, round, and reactive to light.  Neck: Normal range of motion. Neck supple.  Cardiovascular: Normal rate, regular rhythm, normal heart sounds and intact distal pulses.   No murmur heard. Pulmonary/Chest: Effort normal. No respiratory distress. She has no wheezes. She has no rales.   Abdominal: Soft. She exhibits no distension. There is no tenderness.  PEG tube in place without surrounding erythema, edema, or drainage  Musculoskeletal: Normal range of motion. She exhibits no edema or tenderness.  Neurological: She is alert and oriented to person, place, and time. She exhibits normal muscle tone. Coordination normal.  Skin: Skin is warm and dry. No rash noted. She is not diaphoretic.  Psychiatric: Her speech is delayed. She is slowed and withdrawn.  Vitals reviewed.   ED Course  Procedures (including critical care time) Labs Review Labs Reviewed  ETHANOL - Abnormal; Notable for the following:    Alcohol, Ethyl (B) 220 (*)    All other components within normal limits  CBC - Abnormal; Notable for the following:    MCV 102.7 (*)    MCH 34.5 (*)    All other components within normal limits  COMPREHENSIVE METABOLIC PANEL - Abnormal; Notable for the following:    Chloride 100 (*)  Calcium 8.1 (*)    Albumin 3.3 (*)    AST 470 (*)    ALT 137 (*)    Alkaline Phosphatase 205 (*)    All other components within normal limits  URINE RAPID DRUG SCREEN, HOSP PERFORMED - Abnormal; Notable for the following:    Benzodiazepines POSITIVE (*)    All other components within normal limits  CBG MONITORING, ED  CBG MONITORING, ED    Imaging Review No results found. I have personally reviewed and evaluated these images and lab results as part of my medical decision-making.   EKG Interpretation None      MDM  Patient was seen and evaluated in stable condition.  History in chart reviewed.  Patient appears chronically ill.  Reports seizures related to benzo and alcohol withdrawal.  Given a dose of Ativan.  Urine positive for benzos and ETOH 220.  Labs revealed AST 470 and ALT 137 increased from previous although patient with similar episodes on other visits.  Attempted PO challenge with patient multiple times but unable to tolerate.  Patient given thiamine and started on D5  NS in light of her lack of intake.  Discussed with hospitalist who agreed with admission.  Patient admitted for further treatment and evaluation. Final diagnoses:  Alcoholic hepatitis with ascites    1. Alcoholic Hepatitis  2. Seizure    Harvel Quale, MD 07/09/15 [phone]

## 2015-07-09 NOTE — Progress Notes (Signed)
Reported given to [place] [id number], prior to patient transfer.

## 2015-07-10 DIAGNOSIS — E43 Unspecified severe protein-calorie malnutrition: Secondary | ICD-10-CM | POA: Diagnosis present

## 2015-07-10 DIAGNOSIS — K7010 Alcoholic hepatitis without ascites: Secondary | ICD-10-CM | POA: Diagnosis not present

## 2015-07-10 DIAGNOSIS — Y907 Blood alcohol level of 200-239 mg/100 ml: Secondary | ICD-10-CM | POA: Diagnosis present

## 2015-07-10 DIAGNOSIS — Z881 Allergy status to other antibiotic agents status: Secondary | ICD-10-CM | POA: Diagnosis not present

## 2015-07-10 DIAGNOSIS — Z886 Allergy status to analgesic agent status: Secondary | ICD-10-CM | POA: Diagnosis not present

## 2015-07-10 DIAGNOSIS — Z91128 Patient's intentional underdosing of medication regimen for other reason: Secondary | ICD-10-CM | POA: Diagnosis not present

## 2015-07-10 DIAGNOSIS — E876 Hypokalemia: Secondary | ICD-10-CM | POA: Diagnosis present

## 2015-07-10 DIAGNOSIS — Z91040 Latex allergy status: Secondary | ICD-10-CM | POA: Diagnosis not present

## 2015-07-10 DIAGNOSIS — Z79899 Other long term (current) drug therapy: Secondary | ICD-10-CM | POA: Diagnosis not present

## 2015-07-10 DIAGNOSIS — F10229 Alcohol dependence with intoxication, unspecified: Secondary | ICD-10-CM | POA: Diagnosis present

## 2015-07-10 DIAGNOSIS — Z882 Allergy status to sulfonamides status: Secondary | ICD-10-CM | POA: Diagnosis not present

## 2015-07-10 DIAGNOSIS — R569 Unspecified convulsions: Secondary | ICD-10-CM | POA: Diagnosis not present

## 2015-07-10 DIAGNOSIS — R627 Adult failure to thrive: Secondary | ICD-10-CM | POA: Diagnosis present

## 2015-07-10 DIAGNOSIS — Z823 Family history of stroke: Secondary | ICD-10-CM | POA: Diagnosis not present

## 2015-07-10 DIAGNOSIS — F10239 Alcohol dependence with withdrawal, unspecified: Secondary | ICD-10-CM | POA: Diagnosis present

## 2015-07-10 DIAGNOSIS — Z681 Body mass index (BMI) 19 or less, adult: Secondary | ICD-10-CM | POA: Diagnosis not present

## 2015-07-10 DIAGNOSIS — E871 Hypo-osmolality and hyponatremia: Secondary | ICD-10-CM | POA: Diagnosis present

## 2015-07-10 DIAGNOSIS — Z888 Allergy status to other drugs, medicaments and biological substances status: Secondary | ICD-10-CM | POA: Diagnosis not present

## 2015-07-10 DIAGNOSIS — T424X6A Underdosing of benzodiazepines, initial encounter: Secondary | ICD-10-CM | POA: Diagnosis present

## 2015-07-10 DIAGNOSIS — K7011 Alcoholic hepatitis with ascites: Secondary | ICD-10-CM | POA: Diagnosis not present

## 2015-07-10 DIAGNOSIS — Z809 Family history of malignant neoplasm, unspecified: Secondary | ICD-10-CM | POA: Diagnosis not present

## 2015-07-10 DIAGNOSIS — F10120 Alcohol abuse with intoxication, uncomplicated: Secondary | ICD-10-CM | POA: Diagnosis not present

## 2015-07-10 DIAGNOSIS — F19230 Other psychoactive substance dependence with withdrawal, uncomplicated: Secondary | ICD-10-CM | POA: Diagnosis not present

## 2015-07-10 DIAGNOSIS — D696 Thrombocytopenia, unspecified: Secondary | ICD-10-CM | POA: Diagnosis present

## 2015-07-10 DIAGNOSIS — Z931 Gastrostomy status: Secondary | ICD-10-CM | POA: Diagnosis not present

## 2015-07-10 DIAGNOSIS — F17210 Nicotine dependence, cigarettes, uncomplicated: Secondary | ICD-10-CM | POA: Diagnosis present

## 2015-07-10 DIAGNOSIS — F1010 Alcohol abuse, uncomplicated: Secondary | ICD-10-CM | POA: Diagnosis not present

## 2015-07-10 LAB — ETHANOL

## 2015-07-10 LAB — CBC
HCT: 36.2 % (ref 36.0–46.0)
Hemoglobin: 12 g/dL (ref 12.0–15.0)
MCH: 34.3 pg — AB (ref 26.0–34.0)
MCHC: 33.1 g/dL (ref 30.0–36.0)
MCV: 103.4 fL — ABNORMAL HIGH (ref 78.0–100.0)
PLATELETS: 165 10*3/uL (ref 150–400)
RBC: 3.5 MIL/uL — AB (ref 3.87–5.11)
RDW: 12.8 % (ref 11.5–15.5)
WBC: 4.9 10*3/uL (ref 4.0–10.5)

## 2015-07-10 LAB — BASIC METABOLIC PANEL
ANION GAP: 6 (ref 5–15)
BUN: 10 mg/dL (ref 6–20)
CALCIUM: 7.2 mg/dL — AB (ref 8.9–10.3)
CO2: 24 mmol/L (ref 22–32)
Chloride: 105 mmol/L (ref 101–111)
Creatinine, Ser: 0.45 mg/dL (ref 0.44–1.00)
GFR calc non Af Amer: >60 mL/min (ref >60)
GLUCOSE: 57 mg/dL — AB (ref 65–99)
POTASSIUM: 3.8 mmol/L (ref 3.5–5.1)
Sodium: 135 mmol/L (ref 135–145)

## 2015-07-10 LAB — HEPATIC FUNCTION PANEL
ALBUMIN: 2.7 g/dL — AB (ref 3.5–5.0)
ALT: 174 U/L — AB (ref 14–54)
AST: 842 U/L — ABNORMAL HIGH (ref 15–41)
Alkaline Phosphatase: 177 U/L — ABNORMAL HIGH (ref 38–126)
Bilirubin, Direct: <0.1 mg/dL — ABNORMAL LOW (ref 0.1–0.5)
TOTAL PROTEIN: 5.9 g/dL — AB (ref 6.5–8.1)
Total Bilirubin: 0.5 mg/dL (ref 0.3–1.2)

## 2015-07-10 LAB — GLUCOSE, CAPILLARY: Glucose-Capillary: 62 mg/dL — ABNORMAL LOW (ref 65–99)

## 2015-07-10 MED ORDER — ENSURE ENLIVE PO LIQD
237.0000 mL | Freq: Two times a day (BID) | ORAL | Status: DC
  Administered 2015-07-12 – 2015-07-13 (×2): 237 mL via ORAL

## 2015-07-10 NOTE — Progress Notes (Signed)
Patient ID: Kristine Macias, female   DOB: [birth year], 29 y.o.   MRN: [id number]  [facility] PROGRESS NOTE  Kristine Macias WUX:[id number] DOB: 1985/10/13 DOA: 07/09/2015 PCP: Leonides Sake, MD   Brief narrative:    29 y.o. 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 The patient reported that she ran out of ativan one day PTA and mother was with her when she reported episode of seizure earlier in the day piror to this admission. Pt apparently takes 2 mg Ativan 6 times per day. She reports her last alcoholic beverage one day PTA. Pt denies chest pain or shortness of breath, no abd or urinary concerns.   In ED, pt noted to be hemodynamically stable, blood work notable for alcohol level > 200. [facility] asked to admit for observation.   Assessment/Plan:    Active Problems:  Alcoholic intoxication - keep in SDU, alcohol level undetectable this AM - provide supportive care with IVF - keep on CIWA protocol  - pt sleepy this AM, no signs of withdrawal    Alcohol related seizure - no seizures witnessed overnight  - seizure precautions - keep on CIWA protocol    Alcohol abuse with alcoholic hepatitis - LFT's today    Severe PCM, underweight  - in the setting of alcohol abuse - Body mass index is 13.89 kg/(m^2). - nutritionist consulted   DVT prophylaxis - Lovenox SQ  Code Status: Full.  Family Communication:  plan of care discussed with the patient Disposition Plan: Keep in SDU today and possible transfer to tele unit if no tremors of withdrawal   IV access:  Peripheral IV  Procedures and diagnostic studies:    Dg Chest Port 1 View  07/09/2015  CLINICAL DATA:  Acute onset of fever.  Initial encounter. EXAM: PORTABLE CHEST 1 VIEW COMPARISON:  Chest radiograph performed 06/21/2015 FINDINGS: The lungs are well-aerated and clear. There is no  evidence of focal opacification, pleural effusion or pneumothorax. The cardiomediastinal silhouette is within normal limits. No acute osseous abnormalities are seen. IMPRESSION: No acute cardiopulmonary process seen. Electronically Signed   By: Garald Balding M.D.   On: 07/09/2015 21:02   Dg Chest Portable 1 View  06/21/2015  CLINICAL DATA:  Chest pain and seizures EXAM: PORTABLE CHEST 1 VIEW COMPARISON:  05/20/2015 FINDINGS: Normal heart size and mediastinal contours. No acute infiltrate or edema. No effusion or pneumothorax. No acute osseous findings. IMPRESSION: Negative portable chest. Electronically Signed   By: Monte Fantasia M.D.   On: 06/21/2015 10:24    Medical Consultants:  None  Other Consultants:  Nutritionist   IAnti-Infectives:   None  Faye Ramsay, MD  [place] Pager [phone]  If 7PM-7AM, please contact night-coverage [email or web address] Password [facility] 07/10/2015, 10:51 AM   LOS: 1 day   HPI/Subjective: No events overnight.   Objective: Filed Vitals:   07/10/15 0500 07/10/15 0600 07/10/15 0800 07/10/15 0900  BP: 141/91 128/78 124/79 132/89  Pulse: 64 76 76 86  Temp:   98.7 F (37.1 C)   TempSrc:   Oral   Resp: 15 14 14 18   Height:      Weight:      SpO2: 97% 96% 98% 97%    Intake/Output Summary (Last 24 hours) at 07/10/15 1051 Last data filed at 07/10/15 0900  Gross per 24 hour  Intake 2237.5 ml  Output    200 ml  Net 2037.5 ml  Exam:   General:  Pt is sleepy, NAD  Cardiovascular: Regular rate and rhythm, S1/S2, no murmurs, no rubs, no gallops  Respiratory: Clear to auscultation bilaterally, no wheezing, no crackles, no rhonchi  Abdomen: Soft, non tender, non distended, bowel sounds present, no guarding  Extremities: No edema, pulses DP and PT palpable bilaterally  Data Reviewed: Basic Metabolic Panel:  Recent Labs Lab 07/07/15 1653 07/09/15 1248 07/10/15 0405  NA 140 137 135  K 3.5 3.7 3.8  CL 105 100* 105  CO2 30 29 24    GLUCOSE 103* 86 57*  BUN 10 12 10   CREATININE 0.44 0.54 0.45  CALCIUM 8.3* 8.1* 7.2*   Liver Function Tests:  Recent Labs Lab 07/09/15 1248  AST 470*  ALT 137*  ALKPHOS 205*  BILITOT 0.6  PROT 7.5  ALBUMIN 3.3*   CBC:  Recent Labs Lab 07/07/15 1653 07/09/15 1248 07/10/15 0405  WBC 5.0 4.1 4.9  NEUTROABS 2.2  --   --   HGB 13.6 14.0 12.0  HCT 39.9 41.7 36.2  MCV 102.1* 102.7* 103.4*  PLT 176 192 165   CBG:  Recent Labs Lab 07/09/15 1241 07/09/15 1632 07/10/15 0731  GLUCAP 77 85 62*    Recent Results (from the past 240 hour(s))  MRSA PCR Screening     Status: None   Collection Time: 07/09/15  8:40 PM  Result Value Ref Range Status   MRSA by PCR NEGATIVE NEGATIVE Final    Comment:        The GeneXpert MRSA Assay (FDA approved for NASAL specimens only), is one component of a comprehensive MRSA colonization surveillance program. It is not intended to diagnose MRSA infection nor to guide or monitor treatment for MRSA infections.   Culture, expectorated sputum-assessment     Status: None   Collection Time: 07/09/15  9:15 PM  Result Value Ref Range Status   Specimen Description SPUTUM  Final   Special Requests NONE  Final   Sputum evaluation THIS SPECIMEN IS ACCEPTABLE FOR SPUTUM CULTURE  Final   Report Status 07/09/2015 FINAL  Final     Scheduled Meds: . enoxaparin (LOVENOX) injection  30 mg Subcutaneous Q24H  . folic acid  1 mg Oral Daily  . folic acid  1 mg Intravenous Daily  . multivitamin with minerals  1 tablet Oral Daily  . pantoprazole  40 mg Oral Daily  . sodium chloride  3 mL Intravenous Q12H  . thiamine  100 mg Oral Daily   Or  . thiamine  100 mg Intravenous Daily   Continuous Infusions: . sodium chloride 150 mL/hr (07/10/15 1048)

## 2015-07-10 NOTE — Progress Notes (Signed)
[facility] social work consulted and Dr. Doyle Askew notified of concerns.  Left message at weekend pager number [phone].   Patient has two children six and 30 years old.  Patient talked about her husband leaving the family, which included her and the children to marry another.  The patient drinks daily and takes ativan six times daily.  The patient is  intermittently tearful and depressed.   Concerned about the welfare of the children. Generating concern is the sedating effects of ativan and alcohol, the extreme depression and little mention of the children or verbalized concern for their well-being.
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
## 2015-07-11 DIAGNOSIS — K7011 Alcoholic hepatitis with ascites: Secondary | ICD-10-CM

## 2015-07-11 DIAGNOSIS — E871 Hypo-osmolality and hyponatremia: Secondary | ICD-10-CM

## 2015-07-11 DIAGNOSIS — D696 Thrombocytopenia, unspecified: Secondary | ICD-10-CM

## 2015-07-11 DIAGNOSIS — E43 Unspecified severe protein-calorie malnutrition: Secondary | ICD-10-CM

## 2015-07-11 DIAGNOSIS — R627 Adult failure to thrive: Secondary | ICD-10-CM

## 2015-07-11 DIAGNOSIS — F1010 Alcohol abuse, uncomplicated: Secondary | ICD-10-CM

## 2015-07-11 DIAGNOSIS — F19230 Other psychoactive substance dependence with withdrawal, uncomplicated: Secondary | ICD-10-CM

## 2015-07-11 LAB — CBC
HEMATOCRIT: 39.1 % (ref 36.0–46.0)
Hemoglobin: 12.9 g/dL (ref 12.0–15.0)
MCH: 33.6 pg (ref 26.0–34.0)
MCHC: 33 g/dL (ref 30.0–36.0)
MCV: 101.8 fL — ABNORMAL HIGH (ref 78.0–100.0)
PLATELETS: 139 10*3/uL — AB (ref 150–400)
RBC: 3.84 MIL/uL — ABNORMAL LOW (ref 3.87–5.11)
RDW: 12.7 % (ref 11.5–15.5)
WBC: 4.1 10*3/uL (ref 4.0–10.5)

## 2015-07-11 LAB — COMPREHENSIVE METABOLIC PANEL
ALBUMIN: 3 g/dL — AB (ref 3.5–5.0)
ALK PHOS: 192 U/L — AB (ref 38–126)
ALT: 195 U/L — AB (ref 14–54)
AST: 572 U/L — AB (ref 15–41)
Anion gap: 7 (ref 5–15)
BILIRUBIN TOTAL: 0.9 mg/dL (ref 0.3–1.2)
CO2: 24 mmol/L (ref 22–32)
CREATININE: 0.43 mg/dL — AB (ref 0.44–1.00)
Calcium: 7.9 mg/dL — ABNORMAL LOW (ref 8.9–10.3)
Chloride: 103 mmol/L (ref 101–111)
GFR calc Af Amer: >60 mL/min (ref >60)
GLUCOSE: 91 mg/dL (ref 65–99)
POTASSIUM: 3.2 mmol/L — AB (ref 3.5–5.1)
Sodium: 134 mmol/L — ABNORMAL LOW (ref 135–145)
TOTAL PROTEIN: 6.6 g/dL (ref 6.5–8.1)

## 2015-07-11 LAB — MAGNESIUM: MAGNESIUM: 1.5 mg/dL — AB (ref 1.7–2.4)

## 2015-07-11 MED ORDER — POTASSIUM CHLORIDE CRYS ER 20 MEQ PO TBCR
40.0000 meq | EXTENDED_RELEASE_TABLET | Freq: Once | ORAL | Status: AC
  Administered 2015-07-11: 40 meq via ORAL
  Filled 2015-07-11: qty 2

## 2015-07-11 NOTE — Progress Notes (Signed)
Initial Nutrition Assessment  DOCUMENTATION CODES:   Severe malnutrition in context of chronic illness, Underweight  INTERVENTION:   Recommend resume home tube feeding, initiate 2 cans of Jevity 1.5 daily.  Encourage PO intake RD to continue to monitor  NUTRITION DIAGNOSIS:   Malnutrition related to chronic illness as evidenced by severe depletion of body fat, severe depletion of muscle mass.  GOAL:   Patient will meet greater than or equal to 90% of their needs  MONITOR:   PO intake, Labs, Weight trends, TF tolerance, Skin, I & O's  REASON FOR ASSESSMENT:   Consult Assessment of nutrition requirement/status  ASSESSMENT:   29 y.o. 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  Patient in room with no family at bedside. Pt reports PTA only using 1 can of Jevity 1.5, she states she was advised to use 4 cans daily but this makes her feel very full and she won't eat as much food as a result. Currently pt is eating 75-100% of her heart healthy meals. States she ate an omelette and bagel this morning for breakfast. RD explained to pt why she cannot have cheese on this diet.  TF recommendations have been provided above. No TF consult has been ordered.  Per weight history, pt's weight has remained stable. Pt is underweight.  Nutrition-Focused physical exam completed. Findings are severe fat depletion, severe muscle depletion, and no edema.   Labs reviewed: CBGs: 62-85 Low Na, K, BUN, Creatinine  Diet Order:  Diet Heart Room service appropriate?: Yes; Fluid consistency:: Thin  Skin:  Reviewed, no issues  Last BM:  11/13  Height:   Ht Readings from Last 1 Encounters:  07/09/15 5' 6"  (1.676 m)    Weight:   Wt Readings from Last 1 Encounters:  07/11/15 89 lb 4.6 oz (40.5 kg)    Ideal Body Weight:  59.1 kg  BMI:  Body mass index is 14.42  kg/(m^2).  Estimated Nutritional Needs:   Kcal:  1500-1700  Protein:  60-70g  Fluid:  1.7L/day  EDUCATION NEEDS:   No education needs identified at this time  Clayton Bibles, MS, RD, LDN Pager: [phone] After Hours Pager: [phone]

## 2015-07-11 NOTE — Progress Notes (Addendum)
Patient ID: Kristine Macias, female   DOB: [birth year], 29 y.o.   MRN: [id number]  [facility] PROGRESS NOTE  Kristine Macias XBL:[id number] DOB: [birth year] DOA: 07/09/2015 PCP: Leonides Sake, MD   Brief narrative:    29 y.o. 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 The patient reported that she ran out of ativan one day PTA and mother was with her when she reported episode of seizure earlier in the day piror to this admission. Pt apparently takes 2 mg Ativan 6 times per day. She reports her last alcoholic beverage one day PTA. Pt denies chest pain or shortness of breath, no abd or urinary concerns.   In ED, pt noted to be hemodynamically stable, blood work notable for alcohol level > 200. TR[facility] asked to admit for observation.   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 - nutritionist consulted   DVT prophylaxis - Lovenox SQ  Code Status: Full.  Family Communication:  plan of care discussed with the patient Disposition Plan: Keep in SDU today as pt still with tremors   IV access:  Peripheral IV  Procedures and diagnostic studies:    Dg Chest Port 1 View  07/09/2015  CLINICAL DATA:  Acute onset of fever.  Initial encounter. EXAM: PORTABLE CHEST 1 VIEW  COMPARISON:  Chest radiograph performed 06/21/2015 FINDINGS: The lungs are well-aerated and clear. There is no evidence of focal opacification, pleural effusion or pneumothorax. The cardiomediastinal silhouette is within normal limits. No acute osseous abnormalities are seen. IMPRESSION: No acute cardiopulmonary process seen. Electronically Signed   By: Garald Balding M.D.   On: 07/09/2015 21:02   Dg Chest Portable 1 View  06/21/2015  CLINICAL DATA:  Chest pain and seizures EXAM: PORTABLE CHEST 1 VIEW COMPARISON:  05/20/2015 FINDINGS: Normal heart size and mediastinal contours. No acute infiltrate or edema. No effusion or pneumothorax. No acute osseous findings. IMPRESSION: Negative portable chest. Electronically Signed   By: Monte Fantasia M.D.   On: 06/21/2015 10:24    Medical Consultants:  None  Other Consultants:  Nutritionist   IAnti-Infectives:   None  Faye Ramsay, MD  [place] Pager [phone]  If 7PM-7AM, please contact night-coverage [email or web address] Password [facility] 07/11/2015, 12:38 PM   LOS: 2 days   HPI/Subjective: No events overnight.   Objective: Filed Vitals:   07/11/15 0500 07/11/15 0600 07/11/15 0800 07/11/15 1200  BP:   129/86 122/82  Pulse: 78 86 66 78  Temp:   98.8 F (37.1 C)   TempSrc:   Oral   Resp: 16 13 11 16   Height:      Weight: 40.5 kg (89 lb 4.6 oz)     SpO2: 97% 100% 96% 97%    Intake/Output Summary (Last  24 hours) at 07/11/15 1238 Last data filed at 07/11/15 0800  Gross per 24 hour  Intake   2040 ml  Output    350 ml  Net   1690 ml    Exam:   General:  Pt is sleepy, NAD, tremors at rest   Cardiovascular: Regular rate and rhythm, S1/S2, no murmurs, no rubs, no gallops  Respiratory: Clear to auscultation bilaterally, no wheezing, no crackles, no rhonchi  Abdomen: Soft, non tender, non distended, bowel sounds present, no guarding  Extremities: No edema, pulses DP and PT palpable bilaterally  Data Reviewed: Basic Metabolic  Panel:  Recent Labs Lab 07/07/15 1653 07/09/15 1248 07/10/15 0405 07/11/15 0355  NA 140 137 135 134*  K 3.5 3.7 3.8 3.2*  CL 105 100* 105 103  CO2 30 29 24 24   GLUCOSE 103* 86 57* 91  BUN 10 12 10  <5*  CREATININE 0.44 0.54 0.45 0.43*  CALCIUM 8.3* 8.1* 7.2* 7.9*   Liver Function Tests:  Recent Labs Lab 07/09/15 1248 07/10/15 0405 07/11/15 0355  AST 470* 842* 572*  ALT 137* 174* 195*  ALKPHOS 205* 177* 192*  BILITOT 0.6 0.5 0.9  PROT 7.5 5.9* 6.6  ALBUMIN 3.3* 2.7* 3.0*   CBC:  Recent Labs Lab 07/07/15 1653 07/09/15 1248 07/10/15 0405 07/11/15 0355  WBC 5.0 4.1 4.9 4.1  NEUTROABS 2.2  --   --   --   HGB 13.6 14.0 12.0 12.9  HCT 39.9 41.7 36.2 39.1  MCV 102.1* 102.7* 103.4* 101.8*  PLT 176 192 165 139*   CBG:  Recent Labs Lab 07/09/15 1241 07/09/15 1632 07/10/15 0731  GLUCAP 77 85 62*    Recent Results (from the past 240 hour(s))  MRSA PCR Screening     Status: None   Collection Time: 07/09/15  8:40 PM  Result Value Ref Range Status   MRSA by PCR NEGATIVE NEGATIVE Final    Comment:        The GeneXpert MRSA Assay (FDA approved for NASAL specimens only), is one component of a comprehensive MRSA colonization surveillance program. It is not intended to diagnose MRSA infection nor to guide or monitor treatment for MRSA infections.   Culture, expectorated sputum-assessment     Status: None   Collection Time: 07/09/15  9:15 PM  Result Value Ref Range Status   Specimen Description SPUTUM  Final   Special Requests NONE  Final   Sputum evaluation THIS SPECIMEN IS ACCEPTABLE FOR SPUTUM CULTURE  Final   Report Status 07/09/2015 FINAL  Final  Culture, respiratory (NON-Expectorated)     Status: None (Preliminary result)   Collection Time: 07/09/15  9:15 PM  Result Value Ref Range Status   Specimen Description SPUTUM  Final   Special Requests NONE  Final   Gram Stain   Final    FEW WBC PRESENT, PREDOMINANTLY PMN FEW SQUAMOUS EPITHELIAL CELLS  PRESENT MODERATE GRAM POSITIVE RODS FEW GRAM NEGATIVE RODS MODERATE GRAM POSITIVE COCCI IN PAIRS    Culture   Final    NORMAL OROPHARYNGEAL FLORA Performed at [facility]    Report Status PENDING  Incomplete     Scheduled Meds: . enoxaparin (LOVENOX) injection  30 mg Subcutaneous Q24H  . feeding supplement (ENSURE ENLIVE)  237 mL Oral BID BM  . folic acid  1 mg Oral Daily  . multivitamin with minerals  1 tablet Oral Daily  . pantoprazole  40 mg Oral Daily  . sodium chloride  3 mL Intravenous Q12H  .  thiamine  100 mg Oral Daily   Continuous Infusions: . sodium chloride 100 mL/hr at 07/11/15 0800

## 2015-07-11 NOTE — Care Management Note (Signed)
Case Management Note  Patient Details  Name: Kristine Macias MRN: [id number] Date of Birth: Jul 11, 1986  Subjective/Objective:           etoh w/d with seizures         Action/Plan:Date: July 11, 2015 Chart reviewed for concurrent status and case management needs. Will continue to follow patient for changes and needs: Velva Harman, RN, BSN, [place]   [phone]   Expected Discharge Date:                  Expected Discharge Plan:     In-House Referral:     Discharge planning Services     Post Acute Care Choice:    Choice offered to:     DME Arranged:    DME Agency:     HH Arranged:    [place] Agency:     Status of Service:     Medicare Important Message Given:    Date Medicare IM Given:    Medicare IM give by:    Date Additional Medicare IM Given:    Additional Medicare Important Message give by:     If discussed at [place] of Stay Meetings, dates discussed:    Additional Comments:  Leeroy Cha, RN 07/11/2015, 9:40 AM

## 2015-07-12 LAB — COMPREHENSIVE METABOLIC PANEL
ALBUMIN: 3.1 g/dL — AB (ref 3.5–5.0)
ALT: 144 U/L — AB (ref 14–54)
AST: 302 U/L — AB (ref 15–41)
Alkaline Phosphatase: 202 U/L — ABNORMAL HIGH (ref 38–126)
Anion gap: 5 (ref 5–15)
BUN: <5 mg/dL — ABNORMAL LOW (ref 6–20)
CHLORIDE: 108 mmol/L (ref 101–111)
CO2: 24 mmol/L (ref 22–32)
CREATININE: 0.35 mg/dL — AB (ref 0.44–1.00)
Calcium: 8.5 mg/dL — ABNORMAL LOW (ref 8.9–10.3)
GFR calc non Af Amer: >60 mL/min (ref >60)
GLUCOSE: 113 mg/dL — AB (ref 65–99)
Potassium: 4.1 mmol/L (ref 3.5–5.1)
SODIUM: 137 mmol/L (ref 135–145)
Total Bilirubin: 0.7 mg/dL (ref 0.3–1.2)
Total Protein: 6.6 g/dL (ref 6.5–8.1)

## 2015-07-12 LAB — CULTURE, RESPIRATORY: CULTURE: NORMAL

## 2015-07-12 LAB — CBC
HCT: 38.4 % (ref 36.0–46.0)
HEMOGLOBIN: 12.3 g/dL (ref 12.0–15.0)
MCH: 33.8 pg (ref 26.0–34.0)
MCHC: 32 g/dL (ref 30.0–36.0)
MCV: 105.5 fL — ABNORMAL HIGH (ref 78.0–100.0)
PLATELETS: 144 10*3/uL — AB (ref 150–400)
RBC: 3.64 MIL/uL — AB (ref 3.87–5.11)
RDW: 12.8 % (ref 11.5–15.5)
WBC: 5.1 10*3/uL (ref 4.0–10.5)

## 2015-07-12 LAB — MAGNESIUM: Magnesium: 1.6 mg/dL — ABNORMAL LOW (ref 1.7–2.4)

## 2015-07-12 LAB — CULTURE, RESPIRATORY W GRAM STAIN

## 2015-07-12 MED ORDER — MAGNESIUM SULFATE 2 GM/50ML IV SOLN
2.0000 g | Freq: Once | INTRAVENOUS | Status: AC
  Administered 2015-07-12: 2 g via INTRAVENOUS
  Filled 2015-07-12: qty 50
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
## 2015-07-12 NOTE — Progress Notes (Signed)
Patient ID: Kristine Macias, female   DOB: [birth year], 29 y.o.   MRN: [id number]  [facility] PROGRESS NOTE  Hampton Cost KGY:[id number] DOB: September 04, 1985 DOA: 07/09/2015 PCP: Leonides Sake, MD   Brief narrative:    29 y.o. 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 The patient reported that she ran out of ativan one day PTA and mother was with her when she reported episode of seizure earlier in the day piror to this admission. Pt apparently takes 2 mg Ativan 6 times per day. She reports her last alcoholic beverage one day PTA. Pt denies chest pain or shortness of breath, no abd or urinary concerns.   In ED, pt noted to be hemodynamically stable, blood work notable for alcohol level > 200. TRH asked to admit for observation.   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 - nutritionist consulted and recommendations appreciated   DVT prophylaxis - Lovenox SQ  Code Status: Full.  Family Communication:  plan of care discussed with the patient Disposition Plan: transfer to tele metry bed, possible d/c in 24-48 hours   IV access:   Peripheral IV  Procedures and diagnostic studies:    Dg Chest Port 1 View  07/09/2015  CLINICAL DATA:  Acute onset of fever.  Initial encounter. EXAM: PORTABLE CHEST 1 VIEW COMPARISON:  Chest radiograph performed 06/21/2015 FINDINGS: The lungs are well-aerated and clear. There is no evidence of focal opacification, pleural effusion or pneumothorax. The cardiomediastinal silhouette is within normal limits. No acute osseous abnormalities are seen. IMPRESSION: No acute cardiopulmonary process seen. Electronically Signed   By: Garald Balding M.D.   On: 07/09/2015 21:02   Dg Chest Portable 1 View  06/21/2015  CLINICAL DATA:  Chest pain and seizures EXAM: PORTABLE CHEST 1 VIEW COMPARISON:  05/20/2015 FINDINGS: Normal heart size and mediastinal contours. No acute infiltrate or edema. No effusion or pneumothorax. No acute osseous findings. IMPRESSION: Negative portable chest. Electronically Signed   By: Monte Fantasia M.D.   On: 06/21/2015 10:24    Medical Consultants:  None  Other Consultants:  Nutritionist   IAnti-Infectives:   None  Faye Ramsay, MD  [place] Pager [phone]  If 7PM-7AM, please contact night-coverage [email or web address] Password [email or web address] 07/12/2015, 10:06 AM   LOS: 3 days   HPI/Subjective: No events overnight.   Objective: Filed Vitals:   07/12/15 0600 07/12/15 0632 07/12/15 0700 07/12/15 0800  BP:    121/99  Pulse: 56 78 59 82  Temp:    98.1 F (36.7 C)  TempSrc:    Oral  Resp: 13  13 15  Height:      Weight:      SpO2: 100%  96% 98%    Intake/Output Summary (Last 24 hours) at 07/12/15 1006 Last data filed at 07/12/15 0800  Gross per 24 hour  Intake   2600 ml  Output    450 ml  Net   2150 ml    Exam:   General:  Pt is sleepy, NAD, no tremors   Cardiovascular: Regular rate and rhythm, S1/S2, no murmurs, no rubs, no gallops  Respiratory: Clear to auscultation bilaterally, no wheezing, no crackles, no rhonchi  Abdomen: Soft, non tender, non  distended, bowel sounds present, no guarding  Extremities: No edema, pulses DP and PT palpable bilaterally  Data Reviewed: Basic Metabolic Panel:  Recent Labs Lab 07/07/15 1653 07/09/15 1248 07/10/15 0405 07/11/15 0355 07/11/15 1330 07/12/15 0352  NA 140 137 135 134*  --  137  K 3.5 3.7 3.8 3.2*  --  4.1  CL 105 100* 105 103  --  108  CO2 30 29 24 24   --  24  GLUCOSE 103* 86 57* 91  --  113*  BUN 10 12 10  <5*  --  <5*  CREATININE 0.44 0.54 0.45 0.43*  --  0.35*  CALCIUM 8.3* 8.1* 7.2* 7.9*  --  8.5*  MG  --   --   --   --  1.5* 1.6*   Liver Function Tests:  Recent Labs Lab 07/09/15 1248 07/10/15 0405 07/11/15 0355 07/12/15 0352  AST 470* 842* 572* 302*  ALT 137* 174* 195* 144*  ALKPHOS 205* 177* 192* 202*  BILITOT 0.6 0.5 0.9 0.7  PROT 7.5 5.9* 6.6 6.6  ALBUMIN 3.3* 2.7* 3.0* 3.1*   CBC:  Recent Labs Lab 07/07/15 1653 07/09/15 1248 07/10/15 0405 07/11/15 0355 07/12/15 0352  WBC 5.0 4.1 4.9 4.1 5.1  NEUTROABS 2.2  --   --   --   --   HGB 13.6 14.0 12.0 12.9 12.3  HCT 39.9 41.7 36.2 39.1 38.4  MCV 102.1* 102.7* 103.4* 101.8* 105.5*  PLT 176 192 165 139* 144*   CBG:  Recent Labs Lab 07/09/15 1241 07/09/15 1632 07/10/15 0731  GLUCAP 77 85 62*    Recent Results (from the past 240 hour(s))  MRSA PCR Screening     Status: None   Collection Time: 07/09/15  8:40 PM  Result Value Ref Range Status   MRSA by PCR NEGATIVE NEGATIVE Final    Comment:        The GeneXpert MRSA Assay (FDA approved for NASAL specimens only), is one component of a comprehensive MRSA colonization surveillance program. It is not intended to diagnose MRSA infection nor to guide or monitor treatment for MRSA infections.   Culture, expectorated sputum-assessment     Status: None   Collection Time: 07/09/15  9:15 PM  Result Value Ref Range Status   Specimen Description SPUTUM  Final   Special Requests NONE  Final   Sputum evaluation THIS SPECIMEN IS ACCEPTABLE FOR SPUTUM  CULTURE  Final   Report Status 07/09/2015 FINAL  Final  Culture, respiratory (NON-Expectorated)     Status: None   Collection Time: 07/09/15  9:15 PM  Result Value Ref Range Status   Specimen Description SPUTUM  Final   Special Requests NONE  Final   Gram Stain   Final    FEW WBC PRESENT, PREDOMINANTLY PMN FEW SQUAMOUS EPITHELIAL CELLS PRESENT MODERATE GRAM POSITIVE RODS FEW GRAM NEGATIVE RODS MODERATE GRAM POSITIVE COCCI IN PAIRS  Culture   Final    NORMAL OROPHARYNGEAL FLORA Performed at [facility]    Report Status 07/12/2015 FINAL  Final     Scheduled Meds: . enoxaparin (LOVENOX) injection  30 mg Subcutaneous Q24H  . feeding supplement (ENSURE ENLIVE)  237 mL Oral BID BM  . folic acid  1 mg Oral Daily  . multivitamin with minerals  1 tablet Oral Daily  . pantoprazole  40 mg Oral Daily  . sodium chloride  3 mL Intravenous Q12H  . thiamine  100 mg Oral Daily   Continuous Infusions: . sodium chloride 100 mL/hr at 07/12/15 [phone]

## 2015-07-12 NOTE — Progress Notes (Signed)
CSW met with pt on 07/11/15. Pt reports that her mother lives with her and is caring for her 2 children. CSW will return to offer assistance with SA resources.  Werner Lean LCSW [phone]

## 2015-07-12 NOTE — Progress Notes (Addendum)
   07/12/15 1400  Clinical Encounter Type  Visited With Patient  Visit Type Follow-up;Psychological support;Spiritual support;Social support;Critical Care  Referral From Chaplain  Consult/Referral To Chaplain;Nurse  Spiritual Encounters  Spiritual Needs Grief support;Emotional  Stress Factors  Patient Stress Factors Family relationships;Financial concerns    Follow up with pt for continued support around grief due to relationship changes, ETOH.    Kristine Macias is interested in [facility] dependency rehab following discharge from [facility].  She has not participated in rehab before.  States she has been invited to an [facility] group by a friend, but has not attended.  Interested in speaking to social work about options for treatment.   She identifies ETOH use in response to anxiety.  States she grieves the thought of not being able to go home and see her children, but hopes rehab or other substance use treatment will help her cope with stress without using ETOH - thus being in a better place to care for her children.    Chaplain provided support around pt's desire to pursue substance use treatment, continued support around grief and loss due to changes in relationship, narrative support to draw upon her strength as she continues to form resources for coping.     Will make referral to couples and family counseling intern for continued support around relationship changes. Provided Horticulturist, commercial.     [place], [place]

## 2015-07-13 DIAGNOSIS — E876 Hypokalemia: Secondary | ICD-10-CM

## 2015-07-13 DIAGNOSIS — E871 Hypo-osmolality and hyponatremia: Secondary | ICD-10-CM

## 2015-07-13 LAB — COMPREHENSIVE METABOLIC PANEL
ALBUMIN: 3.3 g/dL — AB (ref 3.5–5.0)
ALK PHOS: 166 U/L — AB (ref 38–126)
ALK PHOS: 167 U/L — AB (ref 38–126)
ALT: 112 U/L — AB (ref 14–54)
ALT: 120 U/L — AB (ref 14–54)
AST: 170 U/L — ABNORMAL HIGH (ref 15–41)
AST: 181 U/L — ABNORMAL HIGH (ref 15–41)
Albumin: 3 g/dL — ABNORMAL LOW (ref 3.5–5.0)
Anion gap: 6 (ref 5–15)
Anion gap: 6 (ref 5–15)
BILIRUBIN TOTAL: 0.1 mg/dL — AB (ref 0.3–1.2)
BUN: 7 mg/dL (ref 6–20)
BUN: 7 mg/dL (ref 6–20)
CALCIUM: 8.5 mg/dL — AB (ref 8.9–10.3)
CALCIUM: 8.5 mg/dL — AB (ref 8.9–10.3)
CO2: 25 mmol/L (ref 22–32)
CO2: 29 mmol/L (ref 22–32)
CREATININE: 0.37 mg/dL — AB (ref 0.44–1.00)
CREATININE: 0.51 mg/dL (ref 0.44–1.00)
Chloride: 106 mmol/L (ref 101–111)
Chloride: 104 mmol/L (ref 101–111)
GFR calc Af Amer: >60 mL/min (ref >60)
GFR calc non Af Amer: >60 mL/min (ref >60)
GLUCOSE: 94 mg/dL (ref 65–99)
Glucose, Bld: 97 mg/dL (ref 65–99)
Potassium: 3.4 mmol/L — ABNORMAL LOW (ref 3.5–5.1)
Potassium: 4.1 mmol/L (ref 3.5–5.1)
SODIUM: 139 mmol/L (ref 135–145)
Sodium: 137 mmol/L (ref 135–145)
TOTAL PROTEIN: 6.8 g/dL (ref 6.5–8.1)
Total Bilirubin: 0.3 mg/dL (ref 0.3–1.2)
Total Protein: 7 g/dL (ref 6.5–8.1)

## 2015-07-13 LAB — CBC
HCT: 37.2 % (ref 36.0–46.0)
HEMOGLOBIN: 11.9 g/dL — AB (ref 12.0–15.0)
MCH: 33.8 pg (ref 26.0–34.0)
MCHC: 32 g/dL (ref 30.0–36.0)
MCV: 105.7 fL — AB (ref 78.0–100.0)
Platelets: 128 10*3/uL — ABNORMAL LOW (ref 150–400)
RBC: 3.52 MIL/uL — AB (ref 3.87–5.11)
RDW: 12.7 % (ref 11.5–15.5)
WBC: 6.2 10*3/uL (ref 4.0–10.5)

## 2015-07-13 MED ORDER — MAGNESIUM SULFATE 2 GM/50ML IV SOLN
2.0000 g | Freq: Once | INTRAVENOUS | Status: AC
  Administered 2015-07-13: 2 g via INTRAVENOUS
  Filled 2015-07-13: qty 50

## 2015-07-13 MED ORDER — POTASSIUM CHLORIDE CRYS ER 20 MEQ PO TBCR
40.0000 meq | EXTENDED_RELEASE_TABLET | Freq: Once | ORAL | Status: AC
  Administered 2015-07-13: 40 meq via ORAL
  Filled 2015-07-13: qty 2

## 2015-07-13 NOTE — Progress Notes (Signed)
   07/13/15 1400  Clinical Encounter Type  Visited With Patient  Visit Type Follow-up;Psychological support;Spiritual support;Social support  Referral From Patient  Consult/Referral To Chaplain;Nurse  Spiritual Encounters  Spiritual Needs Grief support;Emotional;Prayer  Stress Factors  Patient Stress Factors Family relationships;Financial concerns;Major life changes    Follow up for continued care around grief and loss.  Kristine Macias hs been working with expressive arts therapies and shared a poem she wrote.  She is envisioning herself starting on a new path in her life.  Reports these art activities were helpful for her anxiety last evening.  Chaplain provided support and prayers at bedside.     Expresses some uncertainty around discharge plans re: ativan as well as ETOH use.  Stated that when she discharged from hospital previously and was working to maintain sobriety, she had a plan for follow up with primary care, psychiatrist and therapist.  She felt this was essential in her success.  Expresses concern that her ativan is well-managed and she has resources for rehab / sobriety following discharge.  Is interested in residential or intensive outpatient if these are options.

## 2015-07-13 NOTE — Progress Notes (Signed)
Patient ID: Kristine Macias, female   DOB: 1986/05/18, 29 y.o.   MRN: [id number]  [facility] PROGRESS NOTE  Bahar Shelden KXF:[id number] DOB: 07-Jun-1986 DOA: 07/09/2015 PCP: Leonides Sake, MD   Brief narrative:    29 y.o. 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 The patient reported that she ran out of ativan one day PTA and mother was with her when she reported episode of seizure earlier in the day piror to this admission. Pt apparently takes 2 mg Ativan 6 times per day. She reports her last alcoholic beverage one day PTA. Pt denies chest pain or shortness of breath, no abd or urinary concerns.   In ED, pt noted to be hemodynamically stable, blood work notable for alcohol level > 200. TRH asked to admit for observation.   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 - nutritionist consulted and recommendations appreciated   DVT prophylaxis - Lovenox SQ  Code Status: Full.  Family Communication:  plan of care discussed with the patient Disposition Plan: d/c when tremors resolved, in 24 - 48 hours  IV access:  Peripheral  IV  Procedures and diagnostic studies:    Dg Chest Port 1 View  07/09/2015  CLINICAL DATA:  Acute onset of fever.  Initial encounter. EXAM: PORTABLE CHEST 1 VIEW COMPARISON:  Chest radiograph performed 06/21/2015 FINDINGS: The lungs are well-aerated and clear. There is no evidence of focal opacification, pleural effusion or pneumothorax. The cardiomediastinal silhouette is within normal limits. No acute osseous abnormalities are seen. IMPRESSION: No acute cardiopulmonary process seen. Electronically Signed   By: Garald Balding M.D.   On: 07/09/2015 21:02   Dg Chest Portable 1 View  06/21/2015  CLINICAL DATA:  Chest pain and seizures EXAM: PORTABLE CHEST 1 VIEW COMPARISON:  05/20/2015 FINDINGS: Normal heart size and mediastinal contours. No acute infiltrate or edema. No effusion or pneumothorax. No acute osseous findings. IMPRESSION: Negative portable chest. Electronically Signed   By: Monte Fantasia M.D.   On: 06/21/2015 10:24    Medical Consultants:  None  Other Consultants:  Nutritionist   IAnti-Infectives:   None  Faye Ramsay, MD  [place] Pager [phone]  If 7PM-7AM, please contact night-coverage [email or web address] Password [email or web address] 07/13/2015, 9:00 AM   LOS: 4 days   HPI/Subjective: No events overnight.   Objective: Filed Vitals:   07/12/15 1200 07/12/15 1531 07/13/15 0004 07/13/15 0500  BP: 110/72 134/84 149/86 152/64  Pulse: 64 63 67 59  Temp:  98.5 F (36.9 C) 98.2 F (36.8 C) 98.2 F (36.8 C)  TempSrc:  Oral Oral Oral  Resp: 13 15 18 16   Height:  Weight:      SpO2: 98% 98% 100% 94%    Intake/Output Summary (Last 24 hours) at 07/13/15 0900 Last data filed at 07/12/15 1300  Gross per 24 hour  Intake    700 ml  Output      0 ml  Net    700 ml    Exam:   General:  Pt is sleepy, NAD, more tremors this AM  Cardiovascular: Regular rate and rhythm, S1/S2, no murmurs, no rubs, no gallops  Respiratory: Clear to auscultation bilaterally, no wheezing, no  crackles, no rhonchi  Abdomen: Soft, non tender, non distended, bowel sounds present, no guarding  Extremities: No edema, pulses DP and PT palpable bilaterally  Data Reviewed: Basic Metabolic Panel:  Recent Labs Lab 07/09/15 1248 07/10/15 0405 07/11/15 0355 07/11/15 1330 07/12/15 0352 07/13/15 0535  NA 137 135 134*  --  137 137  K 3.7 3.8 3.2*  --  4.1 3.4*  CL 100* 105 103  --  108 106  CO2 29 24 24   --  24 25  GLUCOSE 86 57* 91  --  113* 97  BUN 12 10 <5*  --  <5* 7  CREATININE 0.54 0.45 0.43*  --  0.35* 0.37*  CALCIUM 8.1* 7.2* 7.9*  --  8.5* 8.5*  MG  --   --   --  1.5* 1.6*  --    Liver Function Tests:  Recent Labs Lab 07/09/15 1248 07/10/15 0405 07/11/15 0355 07/12/15 0352 07/13/15 0535  AST 470* 842* 572* 302* 170*  ALT 137* 174* 195* 144* 112*  ALKPHOS 205* 177* 192* 202* 166*  BILITOT 0.6 0.5 0.9 0.7 0.1*  PROT 7.5 5.9* 6.6 6.6 6.8  ALBUMIN 3.3* 2.7* 3.0* 3.1* 3.0*   CBC:  Recent Labs Lab 07/07/15 1653 07/09/15 1248 07/10/15 0405 07/11/15 0355 07/12/15 0352 07/13/15 0535  WBC 5.0 4.1 4.9 4.1 5.1 6.2  NEUTROABS 2.2  --   --   --   --   --   HGB 13.6 14.0 12.0 12.9 12.3 11.9*  HCT 39.9 41.7 36.2 39.1 38.4 37.2  MCV 102.1* 102.7* 103.4* 101.8* 105.5* 105.7*  PLT 176 192 165 139* 144* 128*   CBG:  Recent Labs Lab 07/09/15 1241 07/09/15 1632 07/10/15 0731  GLUCAP 77 85 62*    Recent Results (from the past 240 hour(s))  MRSA PCR Screening     Status: None   Collection Time: 07/09/15  8:40 PM  Result Value Ref Range Status   MRSA by PCR NEGATIVE NEGATIVE Final    Comment:        The GeneXpert MRSA Assay (FDA approved for NASAL specimens only), is one component of a comprehensive MRSA colonization surveillance program. It is not intended to diagnose MRSA infection nor to guide or monitor treatment for MRSA infections.   Culture, expectorated sputum-assessment     Status: None   Collection Time: 07/09/15  9:15 PM  Result Value  Ref Range Status   Specimen Description SPUTUM  Final   Special Requests NONE  Final   Sputum evaluation THIS SPECIMEN IS ACCEPTABLE FOR SPUTUM CULTURE  Final   Report Status 07/09/2015 FINAL  Final  Culture, respiratory (NON-Expectorated)     Status: None   Collection Time: 07/09/15  9:15 PM  Result Value Ref Range Status   Specimen Description SPUTUM  Final   Special Requests NONE  Final   Gram Stain   Final    FEW WBC PRESENT, PREDOMINANTLY PMN FEW SQUAMOUS  EPITHELIAL CELLS PRESENT MODERATE GRAM POSITIVE RODS FEW GRAM NEGATIVE RODS MODERATE GRAM POSITIVE COCCI IN PAIRS    Culture   Final    NORMAL OROPHARYNGEAL FLORA Performed at [facility]    Report Status 07/12/2015 FINAL  Final     Scheduled Meds: . enoxaparin (LOVENOX) injection  30 mg Subcutaneous Q24H  . feeding supplement (ENSURE ENLIVE)  237 mL Oral BID BM  . folic acid  1 mg Oral Daily  . multivitamin with minerals  1 tablet Oral Daily  . pantoprazole  40 mg Oral Daily  . sodium chloride  3 mL Intravenous Q12H  . thiamine  100 mg Oral Daily   Continuous Infusions: . sodium chloride 50 mL/hr at 07/13/15 0550

## 2015-07-14 LAB — CBC
HCT: 37.6 % (ref 36.0–46.0)
Hemoglobin: 12.2 g/dL (ref 12.0–15.0)
MCH: 34.4 pg — AB (ref 26.0–34.0)
MCHC: 32.4 g/dL (ref 30.0–36.0)
MCV: 105.9 fL — ABNORMAL HIGH (ref 78.0–100.0)
PLATELETS: 122 10*3/uL — AB (ref 150–400)
RBC: 3.55 MIL/uL — ABNORMAL LOW (ref 3.87–5.11)
RDW: 12.9 % (ref 11.5–15.5)
WBC: 5.9 10*3/uL (ref 4.0–10.5)

## 2015-07-14 LAB — MAGNESIUM: MAGNESIUM: 1.9 mg/dL (ref 1.7–2.4)

## 2015-07-14 MED ORDER — MAGNESIUM OXIDE -MG SUPPLEMENT 400 (240 MG) MG PO TABS: 400.0000 mg | ORAL_TABLET | Freq: Two times a day (BID) | ORAL | Status: DC

## 2015-07-14 MED ORDER — LORAZEPAM 1 MG PO TABS: 1.0000 mg | ORAL_TABLET | Freq: Three times a day (TID) | ORAL | Status: DC | PRN

## 2015-07-14 MED ORDER — FOLIC ACID 1 MG PO TABS: 1.0000 mg | ORAL_TABLET | Freq: Every day | ORAL | Status: DC

## 2015-07-14 MED ORDER — THIAMINE HCL 100 MG PO TABS: 100.0000 mg | ORAL_TABLET | Freq: Every day | ORAL | Status: DC

## 2015-07-14 MED ORDER — MAGNESIUM OXIDE -MG SUPPLEMENT 400 (240 MG) MG PO TABS
400.0000 mg | ORAL_TABLET | Freq: Two times a day (BID) | ORAL | Status: DC
Start: 2015-07-14 — End: 2015-12-07

## 2015-07-14 NOTE — Clinical Social Work Note (Signed)
Clinical Social Work Assessment  Patient Details  Name: Kristine Macias MRN: [id number] Date of Birth: 01-24-1986  Date of referral:  07/14/15               Reason for consult:  Substance Use/ETOH Abuse                Permission sought to share information with:  Chartered certified accountant granted to share information::  Yes, Verbal Permission Granted  Name::        Agency::     Relationship::     Contact Information:     Housing/Transportation Living arrangements for the past 2 months:  Single Family Home Source of Information:  Patient Patient Interpreter Needed:  None Criminal Activity/Legal Involvement Pertinent to Current Situation/Hospitalization:  No - Comment as needed Significant Relationships:  Dependent Children, Parents Lives with:  Minor Children, Parents Do you feel safe going back to the place where you live?  Yes Need for family participation in patient care:  No (Coment)  Care giving concerns:  SA   Social Worker assessment / plan:  Pt hospitalized on 07/09/15 with alcohol intoxication.  Pt lives at home with her mother and her minor children ages 44,8. CSW met with pt to assist with d/c planning. Pt reports that her mother has been caring for her children. Pt reports that she has a  hx of ETOH abuse that is trigger by stress.. Pt reports that she has stopped in the past for about a year. Pt reports that she has received treatment in the past. SA resources have been provided to pt and pt is interested in [facility]. CSW has contacted agency and requested referral form to be sent to [facility]. Agency rep was unable to arrange appt due to lack of openings but did recommend that pt come to agency as a " walk in " between 9am-4pm. Pt is aware of this and will follow up at d/c.  Employment status:  Unemployed Forensic scientist:  Medicaid In [place] PT Recommendations:  Not assessed at this time Information / Referral to community  resources:  Outpatient Substance Abuse Treatment Options  Patient/Family's Response to care:  Pt is willing to accept SA treatment.  Patient/Family's Understanding of and Emotional Response to Diagnosis, Current Treatment, and Prognosis:  Pt is aware of her medical status and reports she is motivated to accept help.  Emotional Assessment Appearance:  Appears older than stated age Attitude/Demeanor/Rapport:  Other (cooperative) Affect (typically observed):  Calm, Pleasant Orientation:  Oriented to Self, Oriented to Place, Oriented to  Time, Oriented to Situation Alcohol / Substance use:  Alcohol Use Psych involvement (Current and /or in the community):  Outpatient Provider  Discharge Needs  Concerns to be addressed:  Substance Abuse Concerns, Coping/Stress Concerns Readmission within the last 30 days:  Yes Current discharge risk:  Substance Abuse, Chronically ill Barriers to Discharge:  No Barriers Identified   Luretha Rued, [place] 07/14/2015, 1:28 PM

## 2015-07-14 NOTE — Discharge Summary (Signed)
Physician Discharge Summary  Kristine Macias BOF:[id number] DOB: Nov 24, 1985 DOA: 07/09/2015  PCP: Leonides Sake, MD  Admit date: 07/09/2015 Discharge date: 07/14/2015  Recommendations for Outpatient Follow-up:  1. Pt will need to follow up with PCP in 2-3 weeks post discharge 2. Please obtain BMP to evaluate electrolytes and kidney function, LFT's 3. Please also check CBC to evaluate Hg and Hct levels, Plt  Discharge Diagnoses:  Active Problems:   Alcohol abuse   Drug withdrawal seizure (HCC)   Withdrawal seizures (HCC)   Alcoholic hepatitis   Alcohol intoxication ([place])   Thrombocytopenia ([place])   Protein-calorie malnutrition, severe ([place])   Hypokalemia   Failure to thrive in adult   Hyponatremia   Alcoholic hepatitis with ascites  Discharge Condition: Stable  Diet recommendation: Heart healthy diet discussed in details    Brief narrative:    29 y.o. 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 The patient reported that she ran out of ativan one day PTA and mother was with her when she reported episode of seizure earlier in the day piror to this admission. Pt apparently takes 2 mg Ativan 6 times per day. She reports her last alcoholic beverage one day PTA. Pt denies chest pain or shortness of breath, no abd or urinary concerns.   In ED, pt noted to be hemodynamically stable, blood work notable for alcohol level > 200. TRH asked to admit for observation.   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 - nutritionist consulted and recommendations appreciated   Code Status: Full.  Family Communication: plan of care discussed with the patient Disposition Plan: d/c   IV access:  Peripheral IV  Procedures and diagnostic studies:    Imaging Results    Dg Chest Port 1 View  07/09/2015 CLINICAL DATA: Acute onset of fever. Initial encounter. EXAM: PORTABLE CHEST 1 VIEW COMPARISON: Chest radiograph performed 06/21/2015 FINDINGS: The lungs are well-aerated and clear. There is no evidence of focal opacification, pleural effusion or pneumothorax. The cardiomediastinal silhouette is within normal limits. No acute osseous abnormalities are seen. IMPRESSION: No acute cardiopulmonary process seen. Electronically Signed By: Garald Balding M.D. On: 07/09/2015 21:02   Dg Chest Portable 1 View  06/21/2015 CLINICAL DATA: Chest pain and seizures EXAM: PORTABLE CHEST 1 VIEW COMPARISON: 05/20/2015 FINDINGS: Normal heart size and mediastinal contours. No acute infiltrate or edema. No effusion or pneumothorax. No acute osseous findings. IMPRESSION: Negative portable chest. Electronically Signed By: Monte Fantasia M.D. On: 06/21/2015 10:24     Medical Consultants:  None  Other Consultants:  Nutritionist   IAnti-Infectives:   None     Discharge Exam: Filed Vitals:   07/14/15 7782  BP: 129/82  Pulse: 71  Temp: 98 F (36.7 C)  Resp: 17   Filed Vitals:   07/13/15 0500 07/13/15 1448 07/13/15 2104 07/14/15 0652  BP: 152/64 135/83 170/99 129/82  Pulse: 59  69 71  Temp: 98.2 F (36.8  C) 98.2 F (36.8 C) 98.2 F (36.8 C) 98 F (36.7 C)  TempSrc: Oral Oral Oral Oral  Resp: 16 16 17 17   Height:      Weight:      SpO2: 94% 97% 99% 100%    General: Pt is alert, follows commands appropriately, not in acute distress Cardiovascular: Regular rate and rhythm, S1/S2 +, no murmurs, no rubs, no gallops Respiratory: Clear to auscultation bilaterally, no wheezing, no crackles, no rhonchi Abdominal: Soft, non tender, non distended, bowel sounds +, no guarding Extremities: no edema, no cyanosis, pulses palpable bilaterally DP and PT  Discharge Instructions  Discharge Instructions    Diet - low sodium heart healthy    Complete by:  As directed      Increase activity slowly    Complete by:  As directed             Medication List    TAKE these medications        CALCIUM-VITAMIN D PO  Take 1 tablet by mouth daily.     feeding supplement (JEVITY 1.5 CAL/FIBER) Liqd  Place 240 mLs into feeding tube 4 (four) times daily.     feeding supplement (JEVITY 1.5 CAL/FIBER) Liqd  Place 237 mLs into feeding tube 4 (four) times daily.     folic acid 1 MG tablet  Commonly known as:  FOLVITE  Take 1 tablet (1 mg total) by mouth daily.     free water Soln  Place 200 mLs into feeding tube every 8 (eight) hours.     LORazepam 1 MG tablet  Commonly known as:  ATIVAN  Take 1 tablet (1 mg total) by mouth every 8 (eight) hours as needed for anxiety.     Magnesium Oxide 400 (240 MG) MG Tabs  Take 400 mg by mouth 2 (two) times daily.     multivitamin with minerals Tabs tablet  Take 1 tablet by mouth daily.     omeprazole 20 MG capsule  Commonly known as:  PRILOSEC  Take 20 mg by mouth daily.     ondansetron 4 MG tablet  Commonly known as:  ZOFRAN  Take 4 mg by mouth every 6 (six) hours as needed for nausea or vomiting.     potassium chloride 10 MEQ tablet  Commonly known as:  K-DUR  Take 1 tablet (10 mEq total) by mouth 2 (two) times daily.     thiamine 100 MG tablet   Take 1 tablet (100 mg total) by mouth daily.            Follow-up Information    Follow up with Leonides Sake, MD.   Specialty:  Family Medicine   Contact information:   [place] [address] [phone]        The results of significant diagnostics from this hospitalization (including imaging, microbiology, ancillary and laboratory) are listed below for reference.     Microbiology: Recent Results (from the past 240 hour(s))  MRSA PCR Screening     Status: None   Collection Time: 07/09/15  8:40 PM  Result Value Ref Range Status   MRSA by PCR NEGATIVE NEGATIVE Final    Comment:        The  GeneXpert MRSA Assay (FDA approved for NASAL specimens only), is one component of a comprehensive MRSA colonization surveillance program. It is not intended to diagnose MRSA infection nor to guide or monitor treatment for MRSA infections.   Culture, expectorated sputum-assessment     Status: None   Collection Time: 07/09/15  9:15 PM  Result Value Ref Range Status   Specimen Description SPUTUM  Final   Special Requests NONE  Final   Sputum evaluation THIS SPECIMEN IS ACCEPTABLE FOR SPUTUM CULTURE  Final   Report Status 07/09/2015 FINAL  Final  Culture, respiratory (NON-Expectorated)     Status: None   Collection Time: 07/09/15  9:15 PM  Result Value Ref Range Status   Specimen Description SPUTUM  Final   Special Requests NONE  Final   Gram Stain   Final    FEW WBC PRESENT, PREDOMINANTLY PMN FEW SQUAMOUS EPITHELIAL CELLS PRESENT MODERATE GRAM POSITIVE RODS FEW GRAM NEGATIVE RODS MODERATE GRAM POSITIVE COCCI IN PAIRS    Culture   Final    NORMAL OROPHARYNGEAL FLORA Performed at [facility]    Report Status 07/12/2015 FINAL  Final     Labs: Basic Metabolic Panel:  Recent Labs Lab 07/10/15 0405 07/11/15 0355 07/11/15 1330 07/12/15 0352 07/13/15 0535 07/13/15 0948 07/14/15 0535  NA 135 134*  --  137 137 139  --   K 3.8 3.2*   --  4.1 3.4* 4.1  --   CL 105 103  --  108 106 104  --   CO2 24 24  --  24 25 29   --   GLUCOSE 57* 91  --  113* 97 94  --   BUN 10 <5*  --  <5* 7 7  --   CREATININE 0.45 0.43*  --  0.35* 0.37* 0.51  --   CALCIUM 7.2* 7.9*  --  8.5* 8.5* 8.5*  --   MG  --   --  1.5* 1.6*  --   --  1.9   Liver Function Tests:  Recent Labs Lab 07/10/15 0405 07/11/15 0355 07/12/15 0352 07/13/15 0535 07/13/15 0948  AST 842* 572* 302* 170* 181*  ALT 174* 195* 144* 112* 120*  ALKPHOS 177* 192* 202* 166* 167*  BILITOT 0.5 0.9 0.7 0.1* 0.3  PROT 5.9* 6.6 6.6 6.8 7.0  ALBUMIN 2.7* 3.0* 3.1* 3.0* 3.3*   No results for input(s): LIPASE, AMYLASE in the last 168 hours. No results for input(s): AMMONIA in the last 168 hours. CBC:  Recent Labs Lab 07/07/15 1653  07/10/15 0405 07/11/15 0355 07/12/15 0352 07/13/15 0535 07/14/15 0535  WBC 5.0  < > 4.9 4.1 5.1 6.2 5.9  NEUTROABS 2.2  --   --   --   --   --   --   HGB 13.6  < > 12.0 12.9 12.3 11.9* 12.2  HCT 39.9  < > 36.2 39.1 38.4 37.2 37.6  MCV 102.1*  < > 103.4* 101.8* 105.5* 105.7* 105.9*  PLT 176  < > 165 139* 144* 128* 122*  < > = values in this interval not displayed.  CBG:  Recent Labs Lab 07/09/15 1241 07/09/15 1632 07/10/15 0731  GLUCAP 77 85 62*     SIGNED: Time coordinating discharge: 30 minutes  MAGICK-Shaan Rhoads, MD  [facility] 07/14/2015, 9:43 AM Pager [phone]  If 7PM-7AM, please contact night-coverage [email or web address] Password [email or web address]

## 2015-07-14 NOTE — Progress Notes (Signed)
Nutrition Follow-up  DOCUMENTATION CODES:   Severe malnutrition in context of chronic illness, Underweight  INTERVENTION:  - Continue Ensure Enlive BID  - RD will continue to monitor for needs  NUTRITION DIAGNOSIS:   Malnutrition related to chronic illness as evidenced by severe depletion of body fat, severe depletion of muscle mass. -ongoing  GOAL:   Patient will meet greater than or equal to 90% of their needs -met with meals and supplements  MONITOR:   PO intake, Supplement acceptance, Weight trends, Labs, Skin, I & O's  ASSESSMENT:   29 y.o. 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  11/17 D/c order in orders section but no d/c summary in at this time. Per review, pt has been eating 100% of all meals since last assessment. Meeting needs with meals and supplements alone; TF may be warranted on d/c if pt unable to maintain good PO intakes.    Medications reviewed. Labs reviewed; Ca: 8.5 mg/dL, LFTs elevated.    11/14 - Pt reports PTA only using 1 can of Jevity 1.5, she states she was advised to use 4 cans daily but this makes her feel very full and she won't eat as much food as a result. - Currently pt is eating 75-100% of her heart healthy meals.  - States she ate an omelette and bagel this morning for breakfast.  - RD explained to pt why she cannot have cheese on this diet.  Diet Order:  Diet regular Room service appropriate?: Yes; Fluid consistency:: Thin Diet - low sodium heart healthy  Skin:  Reviewed, no issues  Last BM:  11/15  Height:   Ht Readings from Last 1 Encounters:  07/09/15 5' 6"  (1.676 m)    Weight:   Wt Readings from Last 1 Encounters:  07/12/15 88 lb 6.5 oz (40.1 kg)    Ideal Body Weight:  59.1 kg  BMI:  Body mass index is 14.28 kg/(m^2).  Estimated Nutritional Needs:   Kcal:  1500-1700  Protein:   60-70g  Fluid:  1.7L/day  EDUCATION NEEDS:   No education needs identified at this time     Jarome Matin, RD, LDN Inpatient Clinical Dietitian Pager # [phone] After hours/weekend pager # [phone]

## 2015-07-14 NOTE — Progress Notes (Signed)
   07/14/15 1200  Clinical Encounter Type  Visited With Patient  Visit Type Initial;Psychological support;Spiritual support  Referral From Chaplain  Consult/Referral To Chaplain  Spiritual Encounters  Spiritual Needs Emotional;Other (Comment) (Pastoral Conversation)  Stress Factors  Patient Stress Factors Health changes   Chaplain was referred to patient by the other staff Chaplain. Patient's children were coming to visit this afternoon, so the Chaplain brought art supplies for the kids.   The patient requests follow-up.    [place]

## 2015-07-14 NOTE — Discharge Instructions (Signed)
Alcoholic Hepatitis Alcoholic hepatitis is liver inflammation caused by drinking alcohol. This inflammation decreases the liver's ability to function normally.  CAUSES  Alcoholic hepatitis is caused by heavy drinking.  RISK FACTORS You may have an increased risk of alcoholic hepatitis if:   You drink large amounts of alcohol.  You have been drinking heavily for years.  You binge drink.  You are female.  You are obese.  You have had infectious hepatitis.  You are malnourished.  You have close family members who have had alcoholic hepatitis. SIGNS AND SYMPTOMS  Abdominal pain.  A swollen abdomen.  Loss of appetite.  Unintentional weight loss.  Nausea and vomiting.  Tiredness.  Dry mouth.  Severe thirst.  A yellow tone to the skin and whites of the eyes (jaundice).  Spidery veins, especially across the skin of the abdomen.  Unusual bleeding.  Itching.  Trouble thinking clearly.  Memory problems.  Mood changes.  Confusion.  Numbness and tingling in the feet and legs. DIAGNOSIS  Alcoholic hepatitis is diagnosed with blood tests that show problems with liver function. Additional tests may also be done, such as:  An ultrasound.  A CT scan.  An MRI scan.  A liver biopsy. For this test, a small sample of the liver will be taken and examined for evidence of liver damage. TREATMENT The most important step you can take to treat alcoholic hepatitis is to stop drinking alcohol. If you are addicted to alcohol, your health care provider will help you create a plan to quit. It may involve:  Taking medicine to decrease withdrawal symptoms.  Entering a program to help you stop drinking.  Joining a support group. Additional treatment for alcoholic hepatitis may include:  Medicines such as steroids. The medicines will help decrease the inflammation.  Diet. Your health care provider might ask you to undergo nutritional counseling and follow a diet. You may  also need to take dietary supplements and vitamins.  A liver transplant. This procedure is performed in very severe cases. It is only performed on people who have totally stopped drinking and can commit to never drinking alcohol again. HOME CARE INSTRUCTIONS  Do not drink alcohol.  Do not use medicines or eat foods that contain alcohol.  Take medicines only as directed by your health care provider.  Follow dietary instructions carefully.  Keep all follow-up visits as directed by your health care provider. This is important. SEEK MEDICAL CARE IF:  You have a fever.  You do not have your usual appetite.  You have flu-like symptoms such as fatigue, weakness, or muscle aches.  You feel nauseous or vomit.  You bruise easily.  Your urine is very dark.  You have new abdominal pain. SEEK IMMEDIATE MEDICAL CARE IF:  There is blood in your vomit.  You develop jaundice.  Your skin itches severely.  Your legs swell.  Your stomach appears bloated.  You have black, tarry-appearing stools.  You bleed easily.  You are confused or not thinking clearly.  You have a seizure. MAKE SURE YOU:  Understand these instructions.  Will watch your condition.  Will get help right away if you are not doing well or get worse.   This information is not intended to replace advice given to you by your health care provider. Make sure you discuss any questions you have with your health care provider.   Document Released: 03/10/2014 Document Reviewed: 03/10/2014 Elsevier Interactive Patient Education [employer].

## 2015-07-16 IMAGING — CT CT ABD-PELV W/ CM
1 of 2 series · 15 of 32 positions shown, 19 images · IV contrast (omnipaque)
Comparison: 03/01/2015; 01/04/2015

CLINICAL DATA: Weight loss.  Tachycardia.  Vomiting.

EXAM:
CT ABDOMEN AND PELVIS WITH CONTRAST
TECHNIQUE: Multidetector CT imaging of the abdomen and pelvis was performed
using the standard protocol following bolus administration of
intravenous contrast.
CONTRAST:  100mL OMNIPAQUE IOHEXOL 300 MG/ML  SOLN

[Series 2: routine abd pel with · axial · 0.68mm/px · z∈[-499,-119]mm · 15 of 84 slices shown, 19 images]
[im 4/84  soft-tissue]
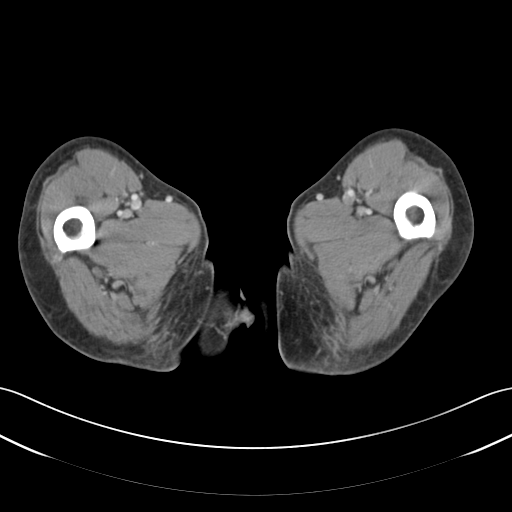
[im 4/84  bone]
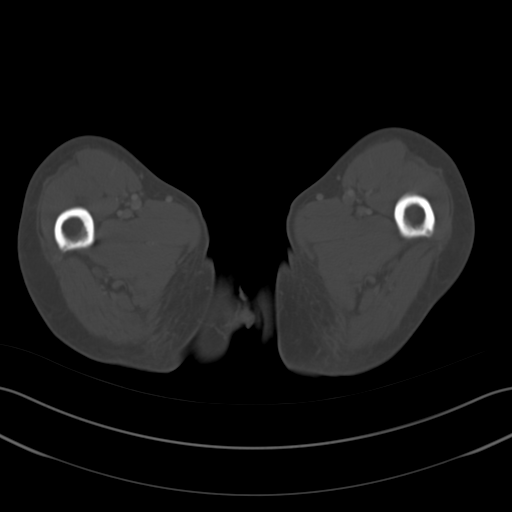
[im 11/84  soft-tissue]
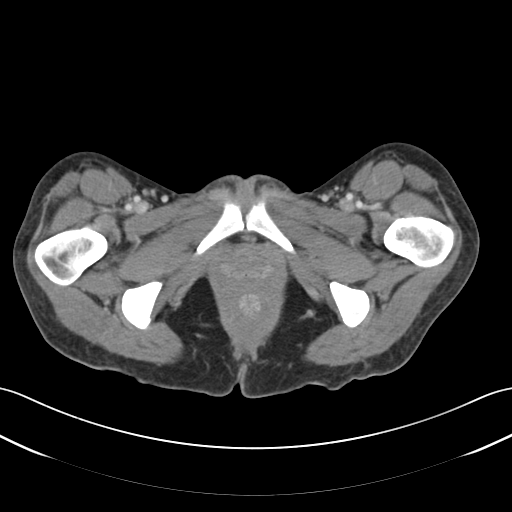
[im 18/84  soft-tissue]
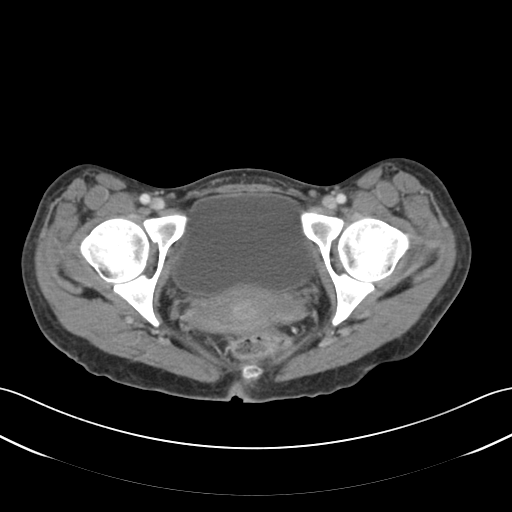
[im 25/84  soft-tissue]
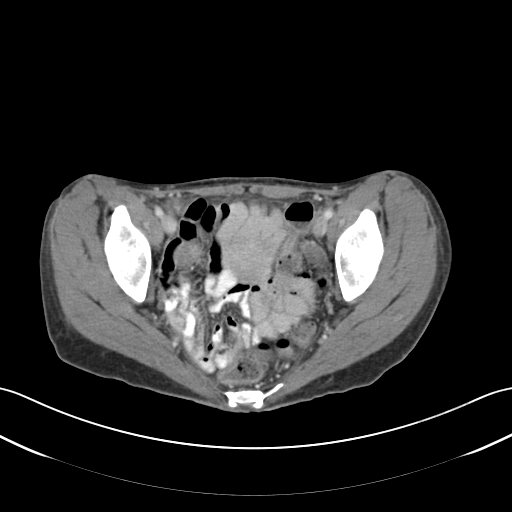
[im 28/84  soft-tissue]
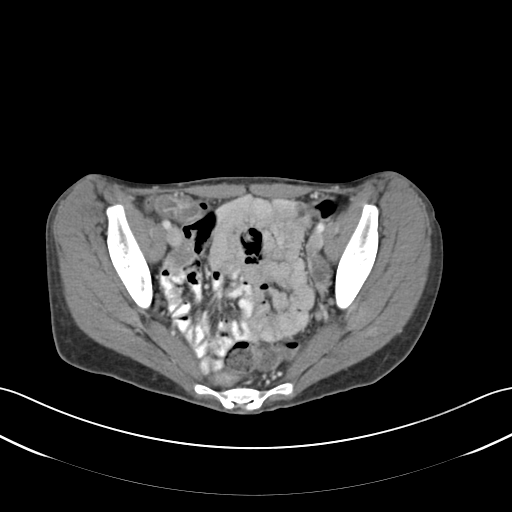
[im 35/84  soft-tissue]
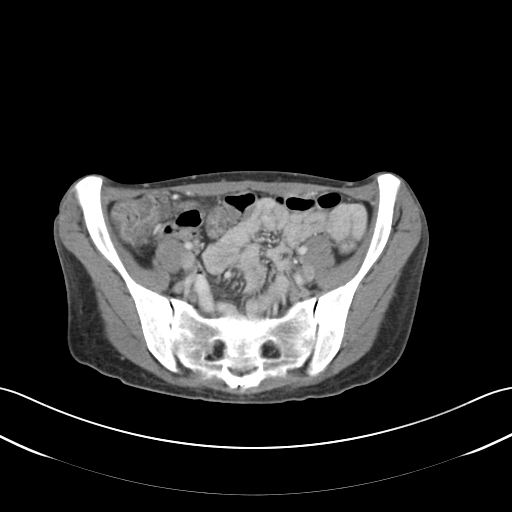
[im 42/84  soft-tissue]
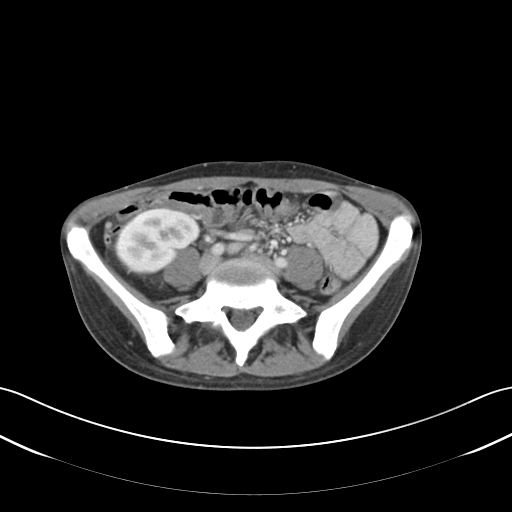
[im 49/84  soft-tissue]
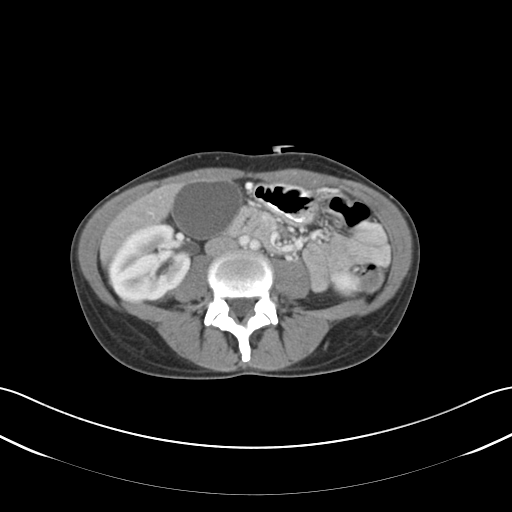
[im 56/84  soft-tissue]
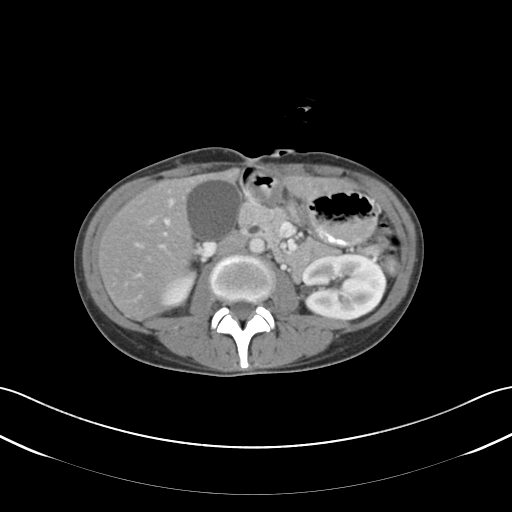
[im 56/84  bone]
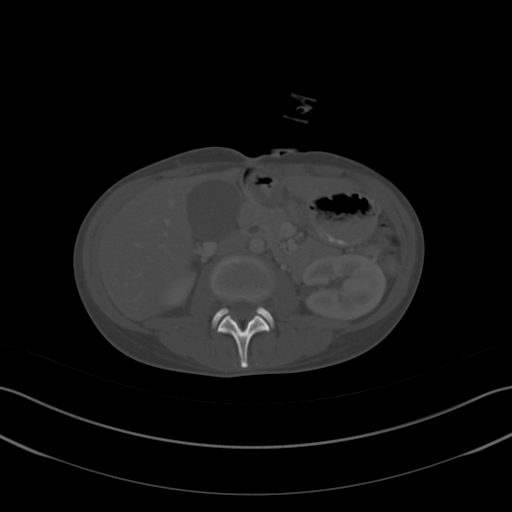
[im 59/84  soft-tissue]
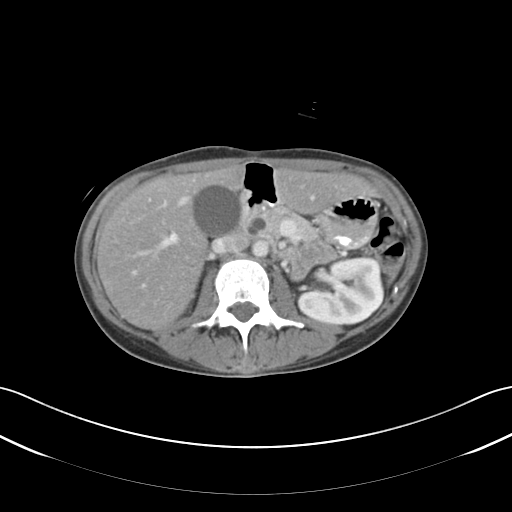
[im 66/84  soft-tissue]
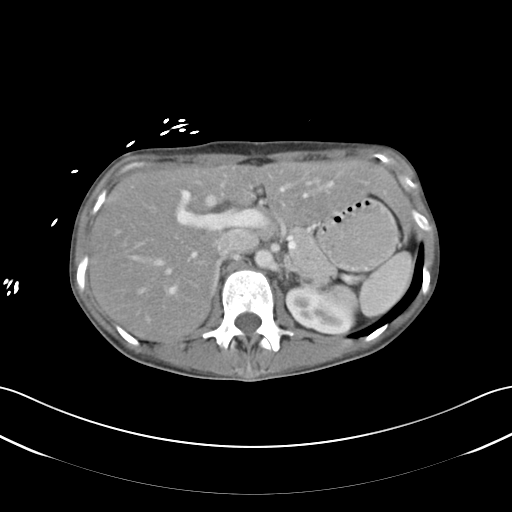
[im 70/84  lung]
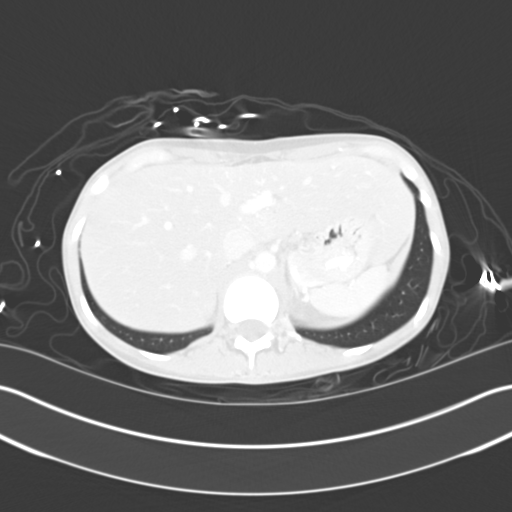
[im 73/84  soft-tissue]
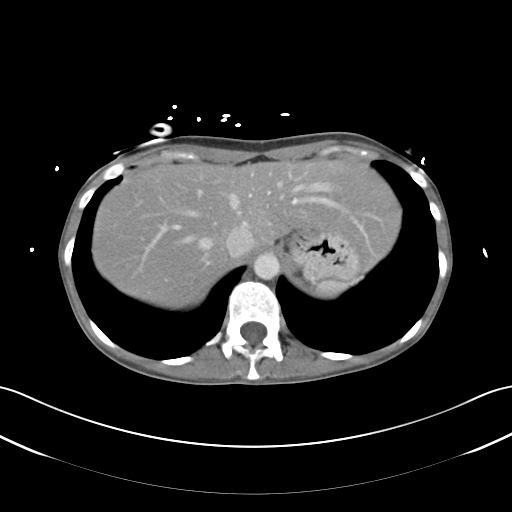
[im 73/84  lung]
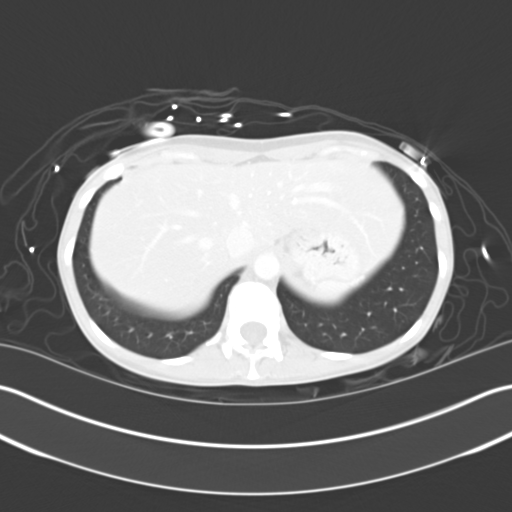
[im 77/84  lung]
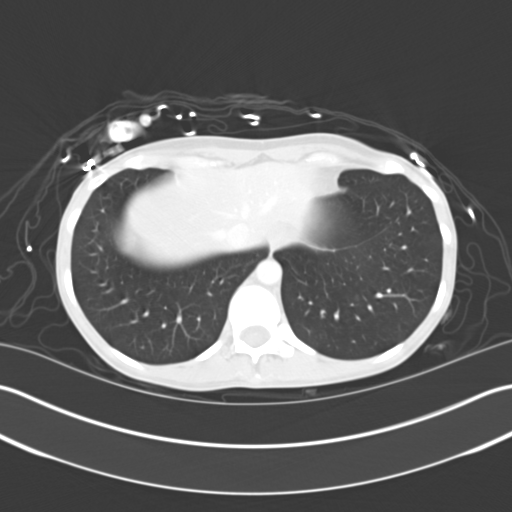
[im 80/84  soft-tissue]
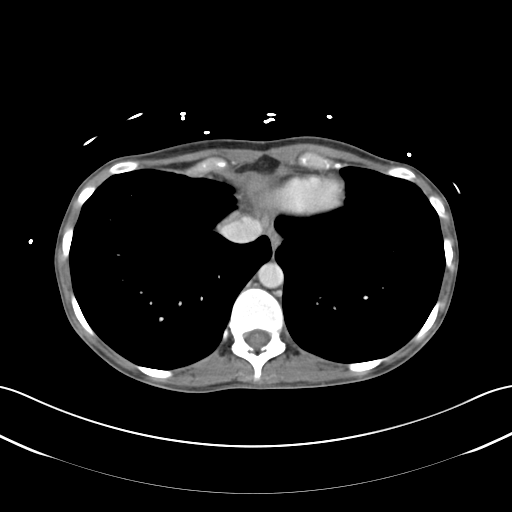
[im 80/84  lung]
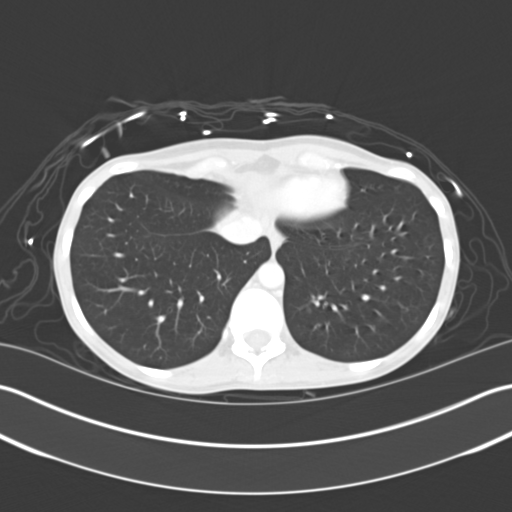

[15 of 32 positions shown; findings below may reference images not displayed]

FINDINGS: Lower chest:  Unremarkable

Hepatobiliary: Diffuse hepatic steatosis. Mildly distended
gallbladder with dilated common hepatic duct at 7 mm, and low origin
of the cystic duct from the common bile duct.

Pancreas: Type 1 pancreas divisum.

Spleen: Unremarkable

Adrenals/Urinary Tract: Tiny amount of gas in the urinary bladder,
query recent catheterization.

Stomach/Bowel: Gastrostomy tube. Appendix normal. Contracted
proximal colon, questionable mucosal enhancement.

Vascular/Lymphatic: Unremarkable

Reproductive: Unremarkable

Other: Considerable paucity of adipose tissue.

Musculoskeletal: Unremarkable
IMPRESSION: 1. There is potential mucosal enhancement in the ascending colon
which could reflect a low-level colitis.
2. Hepatic steatosis.
3. Type 1 pancreas divisum.
4. Mildly prominent gallbladder and continued prominence of the
biliary tree, chronic, etiology uncertain. Reason sonography did not
demonstrate cholelithiasis or choledocholithiasis.

## 2015-07-16 IMAGING — CR DG ABDOMEN ACUTE W/ 1V CHEST
4 series · 4 of 4 positions shown · non-contrast
Comparison: 01/04/2015

CLINICAL DATA: Vomiting, shortness of Breath

EXAM:
DG ABDOMEN ACUTE W/ 1V CHEST

[chest pa]
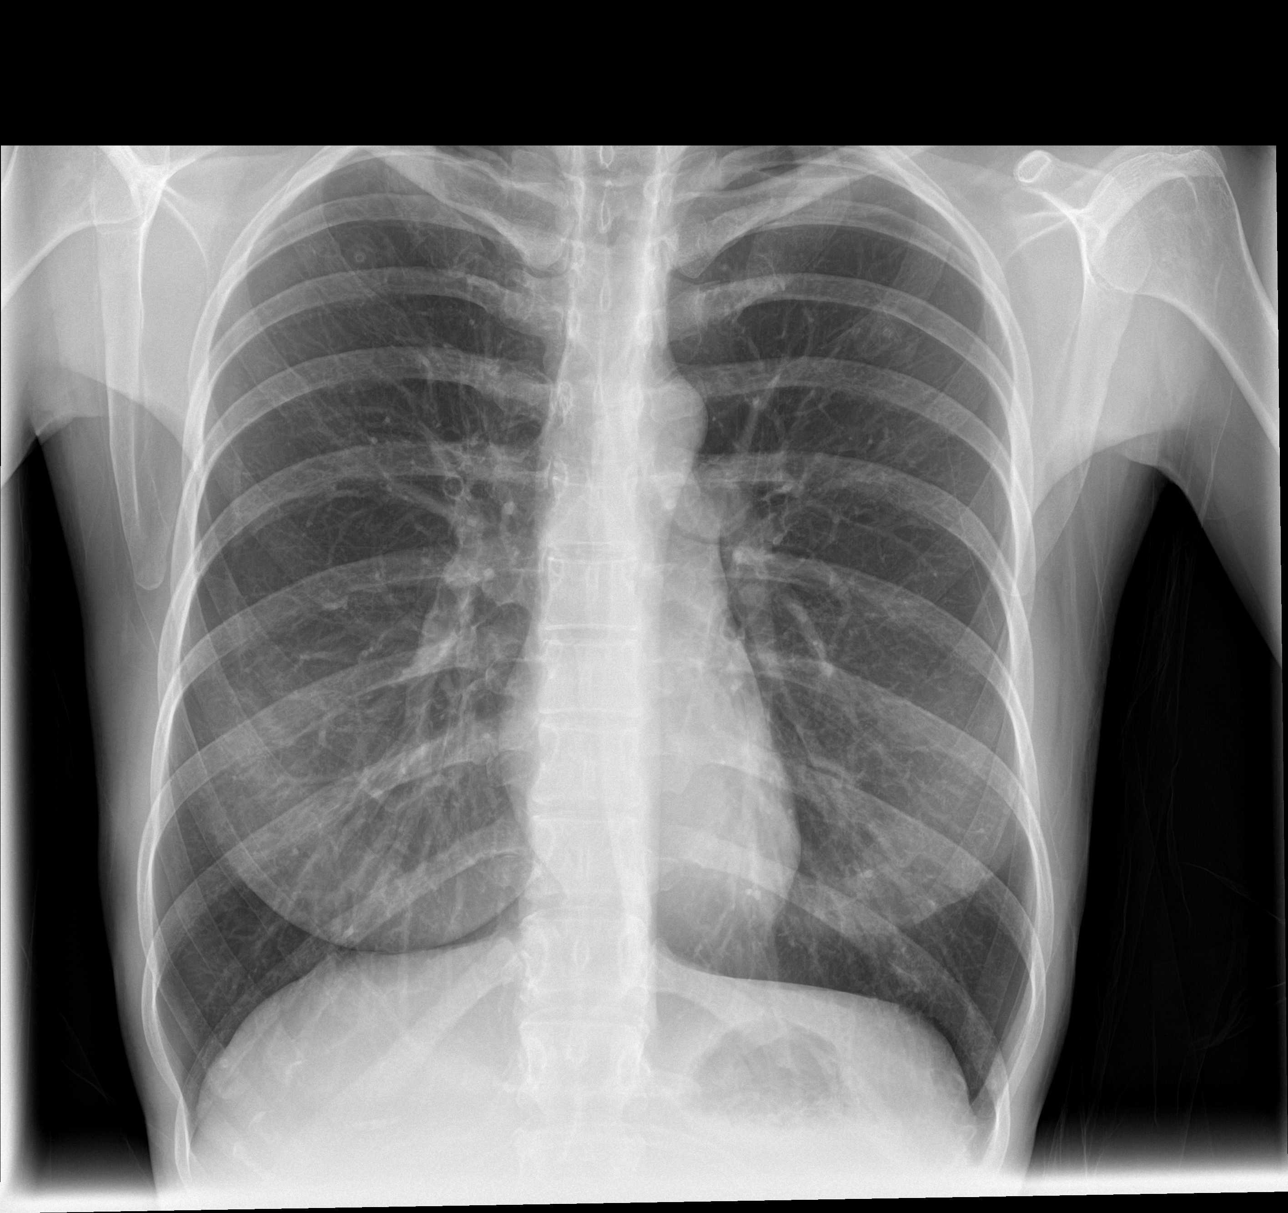

[abdomen erect]
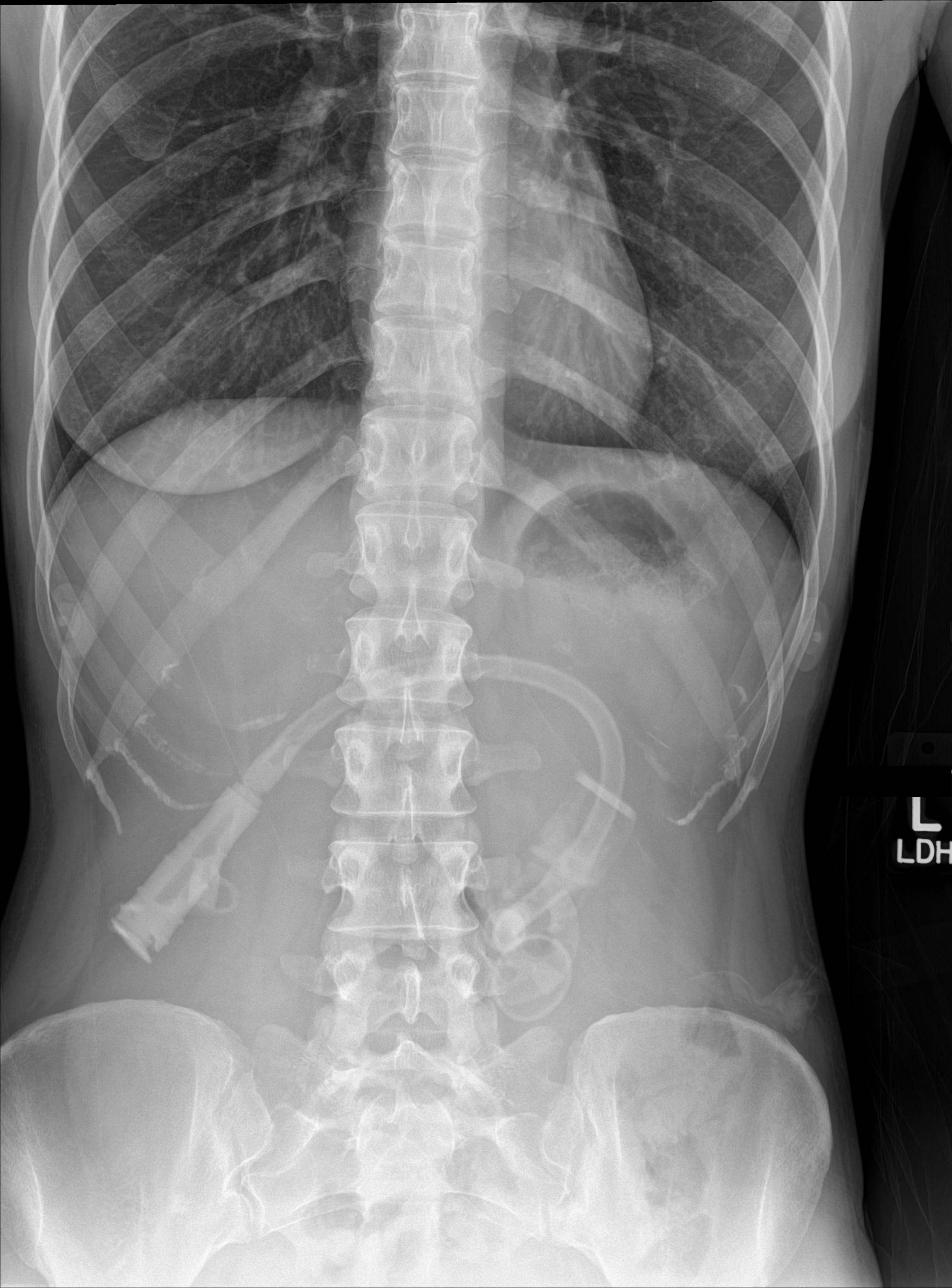

[abdomen supine (1 of 2)]
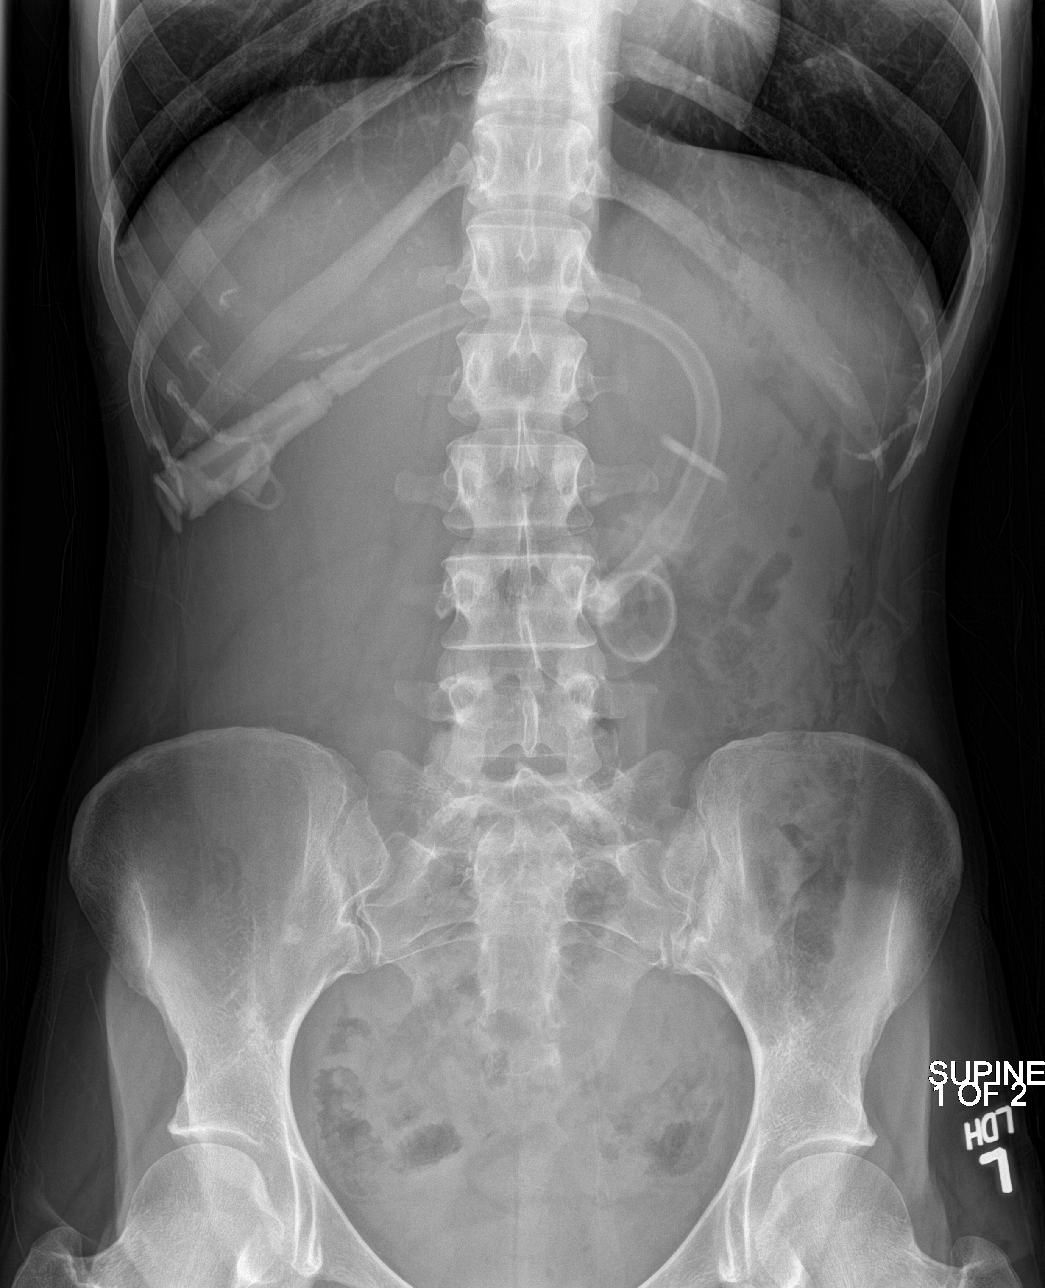

[abdomen supine (2 of 2)]
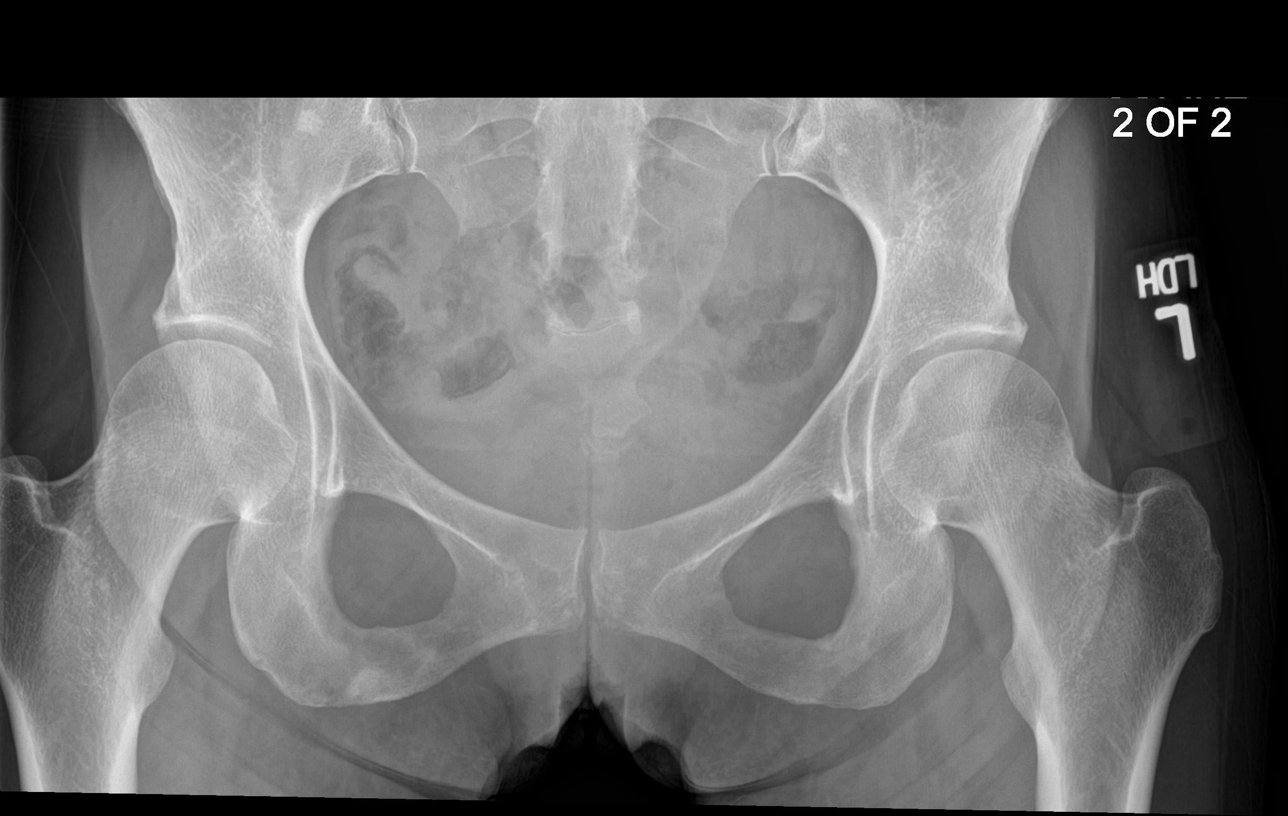

[4 of 4 positions shown; findings below may reference images not displayed]

FINDINGS: Cardiomediastinal silhouette is unremarkable. No acute infiltrate or
pleural effusion. No pulmonary edema. There is normal small bowel
gas pattern. A percutaneous gastric feeding tube is noted.
IMPRESSION: Negative abdominal radiographs.  No acute cardiopulmonary disease.

## 2015-08-08 ENCOUNTER — Inpatient Hospital Stay (HOSPITAL_COMMUNITY)
Admission: EM | Admit: 2015-08-08 | Discharge: 2015-08-10 | DRG: 432 | Disposition: A | Discharge status: Home Health | Payer: Medicaid Other | Attending: Internal Medicine | Admitting: Internal Medicine

## 2015-08-08 ENCOUNTER — Emergency Department (HOSPITAL_COMMUNITY): Payer: Medicaid Other

## 2015-08-08 ENCOUNTER — Encounter (HOSPITAL_COMMUNITY): Payer: Self-pay

## 2015-08-08 DIAGNOSIS — Z79899 Other long term (current) drug therapy: Secondary | ICD-10-CM | POA: Diagnosis not present

## 2015-08-08 DIAGNOSIS — Z91040 Latex allergy status: Secondary | ICD-10-CM | POA: Diagnosis not present

## 2015-08-08 DIAGNOSIS — E43 Unspecified severe protein-calorie malnutrition: Secondary | ICD-10-CM | POA: Diagnosis present

## 2015-08-08 DIAGNOSIS — E871 Hypo-osmolality and hyponatremia: Secondary | ICD-10-CM | POA: Diagnosis present

## 2015-08-08 DIAGNOSIS — F1020 Alcohol dependence, uncomplicated: Secondary | ICD-10-CM | POA: Diagnosis not present

## 2015-08-08 DIAGNOSIS — Z931 Gastrostomy status: Secondary | ICD-10-CM

## 2015-08-08 DIAGNOSIS — Z681 Body mass index (BMI) 19 or less, adult: Secondary | ICD-10-CM | POA: Diagnosis not present

## 2015-08-08 DIAGNOSIS — K7010 Alcoholic hepatitis without ascites: Principal | ICD-10-CM | POA: Insufficient documentation

## 2015-08-08 DIAGNOSIS — R112 Nausea with vomiting, unspecified: Secondary | ICD-10-CM | POA: Diagnosis present

## 2015-08-08 DIAGNOSIS — F419 Anxiety disorder, unspecified: Secondary | ICD-10-CM | POA: Diagnosis present

## 2015-08-08 DIAGNOSIS — Z888 Allergy status to other drugs, medicaments and biological substances status: Secondary | ICD-10-CM | POA: Diagnosis not present

## 2015-08-08 DIAGNOSIS — K759 Inflammatory liver disease, unspecified: Secondary | ICD-10-CM | POA: Diagnosis present

## 2015-08-08 DIAGNOSIS — E876 Hypokalemia: Secondary | ICD-10-CM | POA: Diagnosis present

## 2015-08-08 DIAGNOSIS — Z823 Family history of stroke: Secondary | ICD-10-CM | POA: Diagnosis not present

## 2015-08-08 DIAGNOSIS — Z881 Allergy status to other antibiotic agents status: Secondary | ICD-10-CM

## 2015-08-08 DIAGNOSIS — Z882 Allergy status to sulfonamides status: Secondary | ICD-10-CM

## 2015-08-08 DIAGNOSIS — F17210 Nicotine dependence, cigarettes, uncomplicated: Secondary | ICD-10-CM | POA: Diagnosis present

## 2015-08-08 DIAGNOSIS — F1010 Alcohol abuse, uncomplicated: Secondary | ICD-10-CM | POA: Diagnosis present

## 2015-08-08 DIAGNOSIS — Z886 Allergy status to analgesic agent status: Secondary | ICD-10-CM

## 2015-08-08 DIAGNOSIS — Z809 Family history of malignant neoplasm, unspecified: Secondary | ICD-10-CM | POA: Diagnosis not present

## 2015-08-08 DIAGNOSIS — D696 Thrombocytopenia, unspecified: Secondary | ICD-10-CM | POA: Diagnosis present

## 2015-08-08 DIAGNOSIS — R627 Adult failure to thrive: Secondary | ICD-10-CM | POA: Diagnosis not present

## 2015-08-08 DIAGNOSIS — E860 Dehydration: Secondary | ICD-10-CM | POA: Diagnosis present

## 2015-08-08 DIAGNOSIS — B179 Acute viral hepatitis, unspecified: Secondary | ICD-10-CM

## 2015-08-08 HISTORY — DX: Other pneumothorax: J93.83

## 2015-08-08 HISTORY — DX: Other specified anxiety disorders: F41.8

## 2015-08-08 HISTORY — DX: Alcoholic hepatitis without ascites: K70.10

## 2015-08-08 HISTORY — DX: Thrombocytopenia, unspecified: D69.6

## 2015-08-08 HISTORY — DX: Noninfective gastroenteritis and colitis, unspecified: K52.9

## 2015-08-08 HISTORY — DX: Unspecified convulsions: R56.9

## 2015-08-08 LAB — COMPREHENSIVE METABOLIC PANEL
ALT: 425 U/L — AB (ref 14–54)
AST: 1130 U/L — AB (ref 15–41)
Albumin: 3.7 g/dL (ref 3.5–5.0)
Alkaline Phosphatase: 206 U/L — ABNORMAL HIGH (ref 38–126)
Anion gap: 12 (ref 5–15)
BUN: 21 mg/dL — AB (ref 6–20)
CHLORIDE: 89 mmol/L — AB (ref 101–111)
CO2: 29 mmol/L (ref 22–32)
CREATININE: 0.74 mg/dL (ref 0.44–1.00)
Calcium: 8.6 mg/dL — ABNORMAL LOW (ref 8.9–10.3)
GFR calc Af Amer: >60 mL/min (ref >60)
Glucose, Bld: 142 mg/dL — ABNORMAL HIGH (ref 65–99)
Potassium: 3.2 mmol/L — ABNORMAL LOW (ref 3.5–5.1)
SODIUM: 130 mmol/L — AB (ref 135–145)
Total Bilirubin: 0.9 mg/dL (ref 0.3–1.2)
Total Protein: 7.5 g/dL (ref 6.5–8.1)

## 2015-08-08 LAB — URINALYSIS, ROUTINE W REFLEX MICROSCOPIC
GLUCOSE, UA: NEGATIVE mg/dL
KETONES UR: NEGATIVE mg/dL
Nitrite: NEGATIVE
PH: 5 (ref 5.0–8.0)
Protein, ur: 100 mg/dL — AB
Specific Gravity, Urine: 1.031 — ABNORMAL HIGH (ref 1.005–1.030)

## 2015-08-08 LAB — I-STAT CG4 LACTIC ACID, ED
LACTIC ACID, VENOUS: 2.06 mmol/L — AB (ref 0.5–2.0)
Lactic Acid, Venous: 2.56 mmol/L (ref 0.5–2.0)

## 2015-08-08 LAB — CBC
HCT: 49.4 % — ABNORMAL HIGH (ref 36.0–46.0)
HEMOGLOBIN: 17.6 g/dL — AB (ref 12.0–15.0)
MCH: 33.3 pg (ref 26.0–34.0)
MCHC: 35.6 g/dL (ref 30.0–36.0)
MCV: 93.6 fL (ref 78.0–100.0)
Platelets: 128 10*3/uL — ABNORMAL LOW (ref 150–400)
RBC: 5.28 MIL/uL — ABNORMAL HIGH (ref 3.87–5.11)
RDW: 12.4 % (ref 11.5–15.5)
WBC: 7.4 10*3/uL (ref 4.0–10.5)

## 2015-08-08 LAB — LIPASE, BLOOD: Lipase: 25 U/L (ref 11–51)

## 2015-08-08 LAB — I-STAT TROPONIN, ED: TROPONIN I, POC: 0 ng/mL (ref 0.00–0.08)

## 2015-08-08 LAB — PROTIME-INR
INR: 1.24 (ref 0.00–1.49)
Prothrombin Time: 15.7 seconds — ABNORMAL HIGH (ref 11.6–15.2)

## 2015-08-08 LAB — URINE MICROSCOPIC-ADD ON

## 2015-08-08 LAB — TSH: TSH: 1.219 u[IU]/mL (ref 0.350–4.500)

## 2015-08-08 LAB — ETHANOL: Alcohol, Ethyl (B): 101 mg/dL — ABNORMAL HIGH (ref <5)

## 2015-08-08 LAB — POC URINE PREG, ED: Preg Test, Ur: NEGATIVE

## 2015-08-08 LAB — CK: CK TOTAL: 59 U/L (ref 38–234)

## 2015-08-08 MED ORDER — OXYCODONE HCL 5 MG PO TABS
5.0000 mg | ORAL_TABLET | ORAL | Status: DC | PRN
Start: 2015-08-08 — End: 2015-08-10

## 2015-08-08 MED ORDER — MORPHINE SULFATE (PF) 2 MG/ML IV SOLN: 1.0000 mg | INTRAVENOUS | Status: DC | PRN

## 2015-08-08 MED ORDER — ONDANSETRON HCL 4 MG/2ML IJ SOLN
4.0000 mg | Freq: Four times a day (QID) | INTRAMUSCULAR | Status: DC
  Administered 2015-08-08 – 2015-08-10 (×6): 4 mg via INTRAVENOUS
  Filled 2015-08-08 (×6): qty 2

## 2015-08-08 MED ORDER — FREE WATER: 200.0000 mL | Freq: Three times a day (TID) | Status: DC

## 2015-08-08 MED ORDER — BOOST / RESOURCE BREEZE PO LIQD: 1.0000 | Freq: Three times a day (TID) | ORAL | Status: DC

## 2015-08-08 MED ORDER — LORAZEPAM 2 MG/ML IJ SOLN
0.0000 mg | Freq: Four times a day (QID) | INTRAMUSCULAR | Status: DC
  Administered 2015-08-09 (×2): 2 mg via INTRAVENOUS
  Filled 2015-08-08 (×2): qty 1

## 2015-08-08 MED ORDER — SODIUM CHLORIDE 0.9 % IJ SOLN
3.0000 mL | Freq: Two times a day (BID) | INTRAMUSCULAR | Status: DC
  Administered 2015-08-10: 3 mL via INTRAVENOUS

## 2015-08-08 MED ORDER — THIAMINE HCL 100 MG/ML IJ SOLN: 100.0000 mg | Freq: Every day | INTRAMUSCULAR | Status: DC

## 2015-08-08 MED ORDER — LORAZEPAM 1 MG PO TABS: 1.0000 mg | ORAL_TABLET | ORAL | Status: DC | PRN

## 2015-08-08 MED ORDER — VITAMIN B-1 100 MG PO TABS
100.0000 mg | ORAL_TABLET | Freq: Every day | ORAL | Status: DC
  Administered 2015-08-09 – 2015-08-10 (×2): 100 mg via ORAL
  Filled 2015-08-08 (×2): qty 1

## 2015-08-08 MED ORDER — IOHEXOL 300 MG/ML  SOLN
50.0000 mL | Freq: Once | INTRAMUSCULAR | Status: AC | PRN
  Administered 2015-08-08: 50 mL via ORAL

## 2015-08-08 MED ORDER — SODIUM CHLORIDE 0.9 % IV BOLUS (SEPSIS)
1000.0000 mL | Freq: Once | INTRAVENOUS | Status: AC
  Administered 2015-08-08: 1000 mL via INTRAVENOUS

## 2015-08-08 MED ORDER — SODIUM CHLORIDE 0.9 % IV SOLN
INTRAVENOUS | Status: DC
  Administered 2015-08-08 – 2015-08-10 (×4): via INTRAVENOUS

## 2015-08-08 MED ORDER — IOHEXOL 300 MG/ML  SOLN
100.0000 mL | Freq: Once | INTRAMUSCULAR | Status: AC | PRN
  Administered 2015-08-08: 80 mL via INTRAVENOUS

## 2015-08-08 MED ORDER — LORAZEPAM 2 MG/ML IJ SOLN: 0.0000 mg | Freq: Two times a day (BID) | INTRAMUSCULAR | Status: DC

## 2015-08-08 MED ORDER — LORAZEPAM 2 MG/ML IJ SOLN
1.0000 mg | Freq: Four times a day (QID) | INTRAMUSCULAR | Status: DC | PRN
  Administered 2015-08-08 – 2015-08-10 (×2): 1 mg via INTRAVENOUS
  Filled 2015-08-08 (×2): qty 1

## 2015-08-08 MED ORDER — ADULT MULTIVITAMIN W/MINERALS CH
1.0000 | ORAL_TABLET | Freq: Every day | ORAL | Status: DC
  Administered 2015-08-09 – 2015-08-10 (×2): 1 via ORAL
  Filled 2015-08-08 (×2): qty 1

## 2015-08-08 MED ORDER — LORAZEPAM 1 MG PO TABS
1.0000 mg | ORAL_TABLET | Freq: Four times a day (QID) | ORAL | Status: DC | PRN
  Administered 2015-08-09 (×2): 1 mg via ORAL
  Filled 2015-08-08 (×2): qty 1

## 2015-08-08 MED ORDER — PANTOPRAZOLE SODIUM 40 MG IV SOLR
40.0000 mg | Freq: Two times a day (BID) | INTRAVENOUS | Status: DC
  Administered 2015-08-08 – 2015-08-09 (×3): 40 mg via INTRAVENOUS
  Filled 2015-08-08 (×3): qty 40

## 2015-08-08 MED ORDER — THIAMINE HCL 100 MG/ML IJ SOLN
100.0000 mg | Freq: Once | INTRAMUSCULAR | Status: AC
  Administered 2015-08-08: 100 mg via INTRAVENOUS
  Filled 2015-08-08: qty 2

## 2015-08-08 MED ORDER — FOLIC ACID 1 MG PO TABS
1.0000 mg | ORAL_TABLET | Freq: Every day | ORAL | Status: DC
  Administered 2015-08-09 – 2015-08-10 (×2): 1 mg via ORAL
  Filled 2015-08-08 (×2): qty 1

## 2015-08-08 NOTE — Progress Notes (Signed)
Entered in d/c instructions Medicaid [place] Access Covered Patient USE THIS WEBSITE TO ASSIST WITH UNDERSTANDING YOUR COVERAGE, RENEW APPLICATION [place] Medicaid Transportation to Dr appts if you are have full Medicaid: [phone], [phone] or [phone] Transportation Supervisor [phone] As a Medicaid client you MUST contact DSS/SSI each time you change address, move to another [place] or another state to keep your address updated  [place]: [address], [place] [address] [email or web address]

## 2015-08-08 NOTE — ED Provider Notes (Signed)
CSN: [id number]     Arrival date & time 08/08/15  1306 History   First MD Initiated Contact with Patient 08/08/15 1332     Chief Complaint  Patient presents with  . Shortness of Breath     (Consider location/radiation/quality/duration/timing/severity/associated sxs/prior Treatment) Patient is a 29 y.o. female presenting with shortness of breath and abdominal pain. The history is provided by the patient.  Shortness of Breath Associated symptoms: abdominal pain   Associated symptoms: no chest pain, no fever, no headaches, no vomiting and no wheezing   Abdominal Pain Pain location:  Generalized Pain quality: sharp and shooting   Pain radiates to:  Epigastric region Pain severity:  Moderate Onset quality:  Sudden Duration:  2 days Timing:  Constant Progression:  Worsening Chronicity:  New Relieved by:  Nothing Worsened by:  Nothing tried Ineffective treatments:  None tried Associated symptoms: shortness of breath   Associated symptoms: no chest pain, no chills, no dysuria, no fever, no nausea and no vomiting    29 yo F with a chief complaint of vomiting diarrhea cough congestion. This been going on for the past week. Patient feels like it's getting slightly worse today. Patient has a history of alcoholic hepatitis as well as failure to thrive in adult. Patient having some subjective fevers and chills while at home. Patient denies any injuries denies any IV drug abuse. Went to her family doctor and there is concerned that maybe she had pneumonia.   Past Medical History  Diagnosis Date  . Anxiety   . Chlamydia 2007  . Irregular heart beat 2010    wt/diet related after evaluation  . Renal disorder   . Pneumothorax, spontaneous, tension   . Alcohol abuse   . Pancreas divisum   . Failure to thrive in adult    Past Surgical History  Procedure Laterality Date  . Cesarean section    . Chest tube insertion    . Esophagogastroduodenoscopy N/A 01/06/2015    Procedure:  ESOPHAGOGASTRODUODENOSCOPY (EGD);  Surgeon: Josefine Class, MD;  Location: [facility] ENDOSCOPY;  Service: Endoscopy;  Laterality: N/A;  . Colonoscopy with propofol N/A 01/06/2015    Procedure: COLONOSCOPY WITH PROPOFOL;  Surgeon: Josefine Class, MD;  Location: [facility] ENDOSCOPY;  Service: Endoscopy;  Laterality: N/A;  . Peg placement N/A 01/11/2015    Procedure: PERCUTANEOUS ENDOSCOPIC GASTROSTOMY (PEG) PLACEMENT;  Surgeon: Josefine Class, MD;  Location[facility] ENDOSCOPY;  Service: Endoscopy;  Laterality: N/A;  . Esophagogastroduodenoscopy N/A 01/11/2015    Rein-normal with PEG placement   Family History  Problem Relation Age of Onset  . Anesthesia problems Neg Hx   . Thyroid disease Mother   . Cancer Father   . Stroke Other   . Crohn's disease Brother    Social History  Substance Use Topics  . Smoking status: Current Every Day Smoker -- 2.00 packs/day    Types: Cigarettes  . Smokeless tobacco: Never Used  . Alcohol Use: 21.6 oz/week    36 Cans of beer per week     Comment: 6 beers/day; trying to quit, participating in AA   OB History    Gravida Para Term Preterm AB TAB SAB Ectopic Multiple Living   4 2 2  0 2 1 1  0 0 2     Review of Systems  Constitutional: Negative for fever and chills.  HENT: Negative for congestion and rhinorrhea.   Eyes: Negative for redness and visual disturbance.  Respiratory: Positive for shortness of breath. Negative for wheezing.   Cardiovascular:  Negative for chest pain and palpitations.  Gastrointestinal: Positive for abdominal pain. Negative for nausea and vomiting.  Genitourinary: Negative for dysuria and urgency.  Musculoskeletal: Negative for myalgias and arthralgias.  Skin: Negative for pallor and wound.  Neurological: Negative for dizziness and headaches.      Allergies  Aspirin; Effexor; Sulfa antibiotics; Tetracyclines & related; Trazodone and nefazodone; Latex; Paxil; Seroquel; and Zoloft  Home Medications   Prior to Admission  medications   Medication Sig Start Date End Date Taking? Authorizing Provider  CALCIUM-VITAMIN D PO Take 1 tablet by mouth daily.   Yes Historical Provider, MD  clonazePAM (KLONOPIN) 0.5 MG tablet Take 0.5 mg by mouth 2 (two) times daily. 08/01/15  Yes Historical Provider, MD  folic acid (FOLVITE) 1 MG tablet Take 1 tablet (1 mg total) by mouth daily. 07/14/15  Yes Theodis Blaze, MD  LORazepam (ATIVAN) 1 MG tablet Take 1 tablet (1 mg total) by mouth every 8 (eight) hours as needed for anxiety. 07/14/15  Yes Theodis Blaze, MD  Magnesium Oxide 400 (240 MG) MG TABS Take 400 mg by mouth 2 (two) times daily. 07/14/15  Yes Theodis Blaze, MD  Multiple Vitamin (MULTIVITAMIN WITH MINERALS) TABS tablet Take 1 tablet by mouth daily.   Yes Historical Provider, MD  Nutritional Supplements (FEEDING SUPPLEMENT, JEVITY 1.5 CAL/FIBER,) LIQD Place 237 mLs into feeding tube 4 (four) times daily. 07/02/15  Yes Aldean Jewett, MD  omeprazole (PRILOSEC) 20 MG capsule Take 20 mg by mouth daily.   Yes Historical Provider, MD  ondansetron (ZOFRAN) 4 MG tablet Take 4 mg by mouth every 6 (six) hours as needed for nausea or vomiting.    Yes Historical Provider, MD  potassium chloride (K-DUR) 10 MEQ tablet Take 1 tablet (10 mEq total) by mouth 2 (two) times daily. 04/18/15  Yes Loletha Grayer, MD  thiamine 100 MG tablet Take 1 tablet (100 mg total) by mouth daily. 07/14/15  Yes Theodis Blaze, MD  Nutritional Supplements (FEEDING SUPPLEMENT, JEVITY 1.5 CAL/FIBER,) LIQD Place 240 mLs into feeding tube 4 (four) times daily. Patient not taking: Reported on 06/21/2015 04/18/15   Loletha Grayer, MD  Water For Irrigation, Sterile (FREE WATER) SOLN Place 200 mLs into feeding tube every 8 (eight) hours. Patient not taking: Reported on 06/21/2015 05/23/15   Gladstone Lighter, MD   BP 123/99 mmHg  Pulse 83  Temp(Src) 97.7 F (36.5 C) (Oral)  Resp 13  SpO2 98%  LMP 08/08/2015 Physical Exam  Constitutional: She is oriented to  person, place, and time. She appears well-developed and well-nourished. No distress.  HENT:  Head: Normocephalic and atraumatic.  Eyes: EOM are normal. Pupils are equal, round, and reactive to light.  Neck: Normal range of motion. Neck supple.  Cardiovascular: Normal rate and regular rhythm.  Exam reveals no gallop and no friction rub.   No murmur heard. Pulmonary/Chest: Effort normal. She has no wheezes. She has no rales.  Abdominal: Soft. She exhibits no distension. There is tenderness (RUQ). There is no rebound.  Musculoskeletal: She exhibits no edema or tenderness.  Neurological: She is alert and oriented to person, place, and time.  Skin: Skin is warm and dry. She is not diaphoretic.  Psychiatric: She has a normal mood and affect. Her behavior is normal.  Nursing note and vitals reviewed.   ED Course  Procedures (including critical care time) Labs Review Labs Reviewed  CBC - Abnormal; Notable for the following:    RBC 5.28 (*)  Hemoglobin 17.6 (*)    HCT 49.4 (*)    Platelets 128 (*)    All other components within normal limits  URINALYSIS, ROUTINE W REFLEX MICROSCOPIC (NOT AT [facility]) - Abnormal; Notable for the following:    Color, Urine ORANGE (*)    APPearance CLOUDY (*)    Specific Gravity, Urine 1.031 (*)    Hgb urine dipstick MODERATE (*)    Bilirubin Urine SMALL (*)    Protein, ur 100 (*)    Leukocytes, UA TRACE (*)    All other components within normal limits  COMPREHENSIVE METABOLIC PANEL - Abnormal; Notable for the following:    Sodium 130 (*)    Potassium 3.2 (*)    Chloride 89 (*)    Glucose, Bld 142 (*)    BUN 21 (*)    Calcium 8.6 (*)    AST 1130 (*)    ALT 425 (*)    Alkaline Phosphatase 206 (*)    All other components within normal limits  URINE MICROSCOPIC-ADD ON - Abnormal; Notable for the following:    Squamous Epithelial / LPF 0-5 (*)    Bacteria, UA FEW (*)    All other components within normal limits  I-STAT CG4 LACTIC ACID, ED - Abnormal;  Notable for the following:    Lactic Acid, Venous 2.56 (*)    All other components within normal limits  LIPASE, BLOOD  PROTIME-INR  HEPATITIS PANEL, ACUTE  I-STAT TROPOININ, ED  POC URINE PREG, ED    Imaging Review Dg Chest 2 View  08/08/2015  CLINICAL DATA:  Shortness of breath, nausea and vomiting. EXAM: CHEST - 2 VIEW COMPARISON:  07/09/2015 FINDINGS: The heart size and mediastinal contours are within normal limits. There is no evidence of pulmonary edema, consolidation, pneumothorax, nodule or pleural fluid. The visualized skeletal structures are unremarkable. IMPRESSION: No active disease. Electronically Signed   By: Aletta Edouard M.D.   On: 08/08/2015 14:06   Ct Abdomen Pelvis W Contrast  08/08/2015  CLINICAL DATA:  29 year old female with history of weakness, shortness of breath, nausea and vomiting for the past 5 days. Failure to thrive. EXAM: CT ABDOMEN AND PELVIS WITH CONTRAST TECHNIQUE: Multidetector CT imaging of the abdomen and pelvis was performed using the standard protocol following bolus administration of intravenous contrast. CONTRAST:  31m OMNIPAQUE IOHEXOL 300 MG/ML SOLN, 819mOMNIPAQUE IOHEXOL 300 MG/ML SOLN COMPARISON:  CT the abdomen and pelvis 03/01/2015. FINDINGS: Lower chest:  Unremarkable. Hepatobiliary: Mild diffuse decreased attenuation throughout the hepatic parenchyma, suggestive of hepatic steatosis (difficult to say for certain on today's contrast enhanced CT examination). No cystic or solid hepatic lesions. No intrahepatic biliary ductal dilatation. Common hepatic duct is dilated measuring 8 mm, and cystic duct is dilated measuring 6 mm. However, the common bile duct is normal in caliber measuring 7 x 5 mm (mean diameter of 6 mm). No stone in the distal common bile duct are identified. No gallstones in the gallbladder. Gallbladder is mildly distended, but otherwise normal in appearance. Pancreas: Type 1 pancreatic divisum again noted. No pancreatic mass. No  pancreatic ductal dilatation. No pancreatic or peripancreatic fluid or inflammatory changes. Spleen: Unremarkable. Adrenals/Urinary Tract: Bilateral kidneys and bilateral adrenal glands are normal in appearance. No hydroureteronephrosis. Urinary bladder is completely decompressed. Stomach/Bowel: Percutaneous gastrostomy tube in position with retention balloon in place in the distal body of the stomach. No pathologic dilatation of small bowel or colon. Normal appendix. Vascular/Lymphatic: Minimal atherosclerosis in the abdominal and pelvic vasculature, without evidence of aneurysm  or dissection. No lymphadenopathy noted in the abdomen or pelvis. Reproductive: Uterus and ovaries are unremarkable in appearance. Other: No significant volume of ascites.  No pneumoperitoneum. Musculoskeletal: There are no aggressive appearing lytic or blastic lesions noted in the visualized portions of the skeleton. IMPRESSION: 1. No acute findings in the abdomen or pelvis to account for the patient's symptoms. 2. Chronic mild dilatation of the common hepatic duct and cystic duct, without associated intrahepatic biliary ductal dilatation to indicate significant biliary tree obstruction. This is similar to numerous prior examinations. 3. Type 1 pancreatic divisum redemonstrated. 4. Normal appendix. 5. Additional incidental findings, as above. Electronically Signed   By: Vinnie Langton M.D.   On: 08/08/2015 15:40   I have personally reviewed and evaluated these images and lab results as part of my medical decision-making.   EKG Interpretation None      MDM   Final diagnoses:  Alcoholic hepatitis without ascites  Acute hepatitis    29 yo F with a chief complaint of cough congestion vomiting and diarrhea. This been going on for the past week or so. LFTs significant only elevated. Concern for alcoholic hepatitis. Significant abdominal pain on exam will obtain a CT scan.  CT scan negative for acute pathology.   Discussed  the case with GI will come and eval the patient.   The patients results and plan were reviewed and discussed.   Any x-rays performed were independently reviewed by myself.   Differential diagnosis were considered with the presenting HPI.  Medications  sodium chloride 0.9 % bolus 1,000 mL (0 mLs Intravenous Stopped 08/08/15 1542)  iohexol (OMNIPAQUE) 300 MG/ML solution 50 mL (50 mLs Oral Contrast Given 08/08/15 1411)  thiamine (B-1) injection 100 mg (100 mg Intravenous Given 08/08/15 1535)  iohexol (OMNIPAQUE) 300 MG/ML solution 100 mL (80 mLs Intravenous Contrast Given 08/08/15 1512)    Filed Vitals:   08/08/15 1325 08/08/15 1430 08/08/15 1500  BP: 112/79 124/99 123/99  Pulse: 124 86 83  Temp: 97.7 F (36.5 C)    TempSrc: Oral    Resp: 20 13 13   SpO2: 93% 93% 98%    Final diagnoses:  Alcoholic hepatitis without ascites  Acute hepatitis    Admission/ observation were discussed with the admitting physician, patient and/or family and they are comfortable with the plan.    Deno Etienne, DO 08/08/15 [phone]

## 2015-08-08 NOTE — ED Notes (Addendum)
Awake. Verbally responsive. A/O x4. Resp even and unlabored. No audible adventitious breath sounds noted. ABC's intact. SR on monitor. IV infusing NS at 973m/hr without difficulty. Pt drinking po contrast

## 2015-08-08 NOTE — Progress Notes (Signed)
Called ED 2x  to get report.

## 2015-08-08 NOTE — ED Notes (Signed)
Pt went to MD this morning.  MD thinks she is dehydrated and may have pneumonia. Was going to call EMS but pt refused.  Pt c/o shortness of breath with nausea/vomiting x 5 days. Not eating/drinking.  Weakness

## 2015-08-08 NOTE — Consult Note (Signed)
Referring Provider: [facility] Primary Care Physician:  Leonides Sake, MD Primary Gastroenterologist:  unassigned  Reason for Consultation:   Abnormal LFTs      HPI: Kristine Macias is a 29 y.o. female past medical history anxiety, alcohol abuse sedative abuse, pneumothorax, who is status post a recent admission 07/09/2015 through 07/14/2015. She was evaluated in May by Dr. Rayann Heman in [facility] for failure to thrive. She had lost about 30 pounds since March 2016. She had had a miscarriage at that time and was able to eat with nausea and vomiting and anorexia as well as loose stools. At that admission to [facility] in May 2016 she was noted to have elevated liver enzymes with an AST 3 Times ALT. She admitted to heavy alcohol use and said she wanted to quit. As part of her evaluation for abnormal weight loss she underwent an upper endoscopy and colonoscopy on 01/06/2015 by Dr Rayann Heman, the followings of which are as follows:The esophagus was normal. Findings: The stomach was normal. The examined duodenum was normal. Five biopsies were obtained with cold forceps for histology randomly in the duodenal bulb and in the 2nd part of the duodenum. - Normal esophagus. - Normal stomach. - Normal examined duodenum. - Five biopsies were obtained in the duodenal bulb and in the 2nd part of the duodenum.   - Mildly erythematous mucosa in the terminal ileum. Biopsied. - Erythematous and granular mucosa in the distal rectum with possible ulcer. Biopsied. - The examination was otherwise normal on direct and retroflexion views. - Biopsies were taken with a cold forceps for histology in the proximal transverse colon and in the ascending colon. - Biopsies were taken with a cold forceps for histology in the rectum and in the sigmoid colon. Impression: - Observe patient in GI recovery unit. - Resume regular diet. - Continue present medications. - Await pathology results. - The findings and  recommendations were discussed with the patient. - Need to consider PEG tube for severe malnutrition ( BMI 14), pending psych input.  Duodenal biopsies showed chronic active duodenitis with no dysplasia. Ileal biopsies showed active ileitis with focal erosions but no granulomas or dysplasia seen.: Biopsies of the right and left colon were normal and rectal abscesses and changes consistent with mucosal prolapse, no dysplasia seen.  She then underwent an EGD with PEG placement on 01/11/2015:  - Normal esophagus. - Normal stomach. - Normal examined duodenum. - An externally removable PEG placement was successfully completed. - No specimens collected. Impression: - Observe patient in GI recovery unit. - Return patient to hospital ward for ongoing care. - Please follow the post-PEG recommendations including: Nutrition consult for formula and volume, antibiotic ointment to site, change dressing once per day, check site for bleeding q 4 hrs, NPO x4 hrs then water today, may use PEG today for meds and water and may use PEG tomorrow for feedings. - Follow up in GI clinic in 2 weeks - Nutriton consult for tube feed initiation. 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  Since that time, she states she was doing fairly well and has been able to minimize use of the feeding tube. She states up until 5 days ago she was eating anything and everything and would only cause small amounts of water through the tube to keep it patent. She states she was supposed to have the PEG removed in the near future.  On previous admission in November she presented for evaluation of a seizure that reportedly occurred at home.  The patient stated that she ran out of her Ativan a day prior to admission and her mother was with her when she reported a seizure. She was noted to have elevated LFTs on admission which had trended down.  She presented to the emergency room today with complaint of cough, shortness of  breath, and intermittent abdominal pain. She's had no fever, chills, or night sweats. She states she's been nauseous and has been unable to keep food or liquids down. She has been having some burning in the esophagus despite using Prevacid each morning. She denies dysphagia. She has been having loose stools as well. She reports that several days ago she started drinking again and reports that she has 3 or 4 beers daily for the past 5 days. She denies any IV drug abuse. She has been smoking cigarettes. She was evaluated by her PCP today and was advised to come to the emergency room for evaluation of possible pneumonia. Laboratory studies in the emergency room revealed an AST of 1130, ALT 425, alkaline phosphatase of 206. Total bili 0.9. CBC had a white count of 7.4, hemoglobin 17.6, hematocrit 49.4, platelets 128,000. INR was 1.24. CT of the abdomen and no acute findings to account for her symptoms. There was chronic mild dilatation of common hepatic duct and cystic duct without associated intrahepatic biliary ductal dilatation to indicate significant biliary tree obstruction. This is similar to numerous prior examinations. Type I pancreatic duct by some redemonstrated. Kristine Macias says she has been seeing psychiatry and working with the counselor to try to stop drinking but she had this recent relapse. She is very concerned about withdrawing during this admission.  Past Medical History  Diagnosis Date  . Anxiety   . Chlamydia 2007  . Irregular heart beat 2010    wt/diet related after evaluation  . Renal disorder   . Pneumothorax, spontaneous, tension   . Alcohol abuse   . Pancreas divisum   . Failure to thrive in adult     Past Surgical History  Procedure Laterality Date  . Cesarean section    . Chest tube insertion    . Esophagogastroduodenoscopy N/A 01/06/2015    Procedure: ESOPHAGOGASTRODUODENOSCOPY (EGD);  Surgeon: Josefine Class, MD;  Location: [facility] ENDOSCOPY;  Service: Endoscopy;   Laterality: N/A;  . Colonoscopy with propofol N/A 01/06/2015    Procedure: COLONOSCOPY WITH PROPOFOL;  Surgeon: Josefine Class, MD;  Location: [facility] ENDOSCOPY;  Service: Endoscopy;  Laterality: N/A;  . Peg placement N/A 01/11/2015    Procedure: PERCUTANEOUS ENDOSCOPIC GASTROSTOMY (PEG) PLACEMENT;  Surgeon: Josefine Class, MD;  Location: [facility] ENDOSCOPY;  Service: Endoscopy;  Laterality: N/A;  . Esophagogastroduodenoscopy N/A 01/11/2015    Rein-normal with PEG placement    Prior to Admission medications   Medication Sig Start Date End Date Taking? Authorizing Provider  CALCIUM-VITAMIN D PO Take 1 tablet by mouth daily.   Yes Historical Provider, MD  clonazePAM (KLONOPIN) 0.5 MG tablet Take 0.5 mg by mouth 2 (two) times daily. 08/01/15  Yes Historical Provider, MD  folic acid (FOLVITE) 1 MG tablet Take 1 tablet (1 mg total) by mouth daily. 07/14/15  Yes Theodis Blaze, MD  LORazepam (ATIVAN) 1 MG tablet Take 1 tablet (1 mg total) by mouth every 8 (eight) hours as needed for anxiety. 07/14/15  Yes Theodis Blaze, MD  Magnesium Oxide 400 (240 MG) MG TABS Take 400 mg by mouth 2 (two) times daily. 07/14/15  Yes Theodis Blaze, MD  Multiple Vitamin (MULTIVITAMIN WITH  MINERALS) TABS tablet Take 1 tablet by mouth daily.   Yes Historical Provider, MD  Nutritional Supplements (FEEDING SUPPLEMENT, JEVITY 1.5 CAL/FIBER,) LIQD Place 237 mLs into feeding tube 4 (four) times daily. 07/02/15  Yes Aldean Jewett, MD  omeprazole (PRILOSEC) 20 MG capsule Take 20 mg by mouth daily.   Yes Historical Provider, MD  ondansetron (ZOFRAN) 4 MG tablet Take 4 mg by mouth every 6 (six) hours as needed for nausea or vomiting.    Yes Historical Provider, MD  potassium chloride (K-DUR) 10 MEQ tablet Take 1 tablet (10 mEq total) by mouth 2 (two) times daily. 04/18/15  Yes Loletha Grayer, MD  thiamine 100 MG tablet Take 1 tablet (100 mg total) by mouth daily. 07/14/15  Yes Theodis Blaze, MD  Nutritional Supplements (FEEDING  SUPPLEMENT, JEVITY 1.5 CAL/FIBER,) LIQD Place 240 mLs into feeding tube 4 (four) times daily. Patient not taking: Reported on 06/21/2015 04/18/15   Loletha Grayer, MD  Water For Irrigation, Sterile (FREE WATER) SOLN Place 200 mLs into feeding tube every 8 (eight) hours. Patient not taking: Reported on 06/21/2015 05/23/15   Gladstone Lighter, MD    No current facility-administered medications for this encounter.   Current Outpatient Prescriptions  Medication Sig Dispense Refill  . CALCIUM-VITAMIN D PO Take 1 tablet by mouth daily.    . clonazePAM (KLONOPIN) 0.5 MG tablet Take 0.5 mg by mouth 2 (two) times daily.  0  . folic acid (FOLVITE) 1 MG tablet Take 1 tablet (1 mg total) by mouth daily. 30 tablet 0  . LORazepam (ATIVAN) 1 MG tablet Take 1 tablet (1 mg total) by mouth every 8 (eight) hours as needed for anxiety. 32 tablet 0  . Magnesium Oxide 400 (240 MG) MG TABS Take 400 mg by mouth 2 (two) times daily. 60 tablet 0  . Multiple Vitamin (MULTIVITAMIN WITH MINERALS) TABS tablet Take 1 tablet by mouth daily.    . Nutritional Supplements (FEEDING SUPPLEMENT, JEVITY 1.5 CAL/FIBER,) LIQD Place 237 mLs into feeding tube 4 (four) times daily. 30000 mL 0  . omeprazole (PRILOSEC) 20 MG capsule Take 20 mg by mouth daily.    . ondansetron (ZOFRAN) 4 MG tablet Take 4 mg by mouth every 6 (six) hours as needed for nausea or vomiting.     . potassium chloride (K-DUR) 10 MEQ tablet Take 1 tablet (10 mEq total) by mouth 2 (two) times daily. 60 tablet 0  . thiamine 100 MG tablet Take 1 tablet (100 mg total) by mouth daily. 30 tablet 0  . Nutritional Supplements (FEEDING SUPPLEMENT, JEVITY 1.5 CAL/FIBER,) LIQD Place 240 mLs into feeding tube 4 (four) times daily. (Patient not taking: Reported on 06/21/2015) 28800 mL 1  . Water For Irrigation, Sterile (FREE WATER) SOLN Place 200 mLs into feeding tube every 8 (eight) hours. (Patient not taking: Reported on 06/21/2015) 1000 mL 5    Allergies as of 08/08/2015 -  Review Complete 08/08/2015  Allergen Reaction Noted  . Aspirin Shortness Of Breath 05/23/2015  . Effexor [venlafaxine] Anaphylaxis 01/13/2015  . Sulfa antibiotics Anaphylaxis 08/31/2011  . Tetracyclines & related Anaphylaxis 08/31/2011  . Trazodone and nefazodone Anaphylaxis 01/13/2015  . Latex Hives, Itching, and Rash 08/31/2011  . Paxil [paroxetine] Hives, Itching, and Rash 01/13/2015  . Seroquel [quetiapine fumarate] Hives, Itching, and Rash 01/13/2015  . Zoloft [sertraline hcl] Hives, Itching, and Rash 01/13/2015    Family History  Problem Relation Age of Onset  . Anesthesia problems Neg Hx   . Thyroid disease Mother   .  Cancer Father   . Stroke Other   . Crohn's disease Brother     Social History   Social History  . Marital Status: Single    Spouse Name: N/A  . Number of Children: N/A  . Years of Education: N/A   Occupational History  . Not on file.   Social History Main Topics  . Smoking status: Current Every Day Smoker -- 2.00 packs/day    Types: Cigarettes  . Smokeless tobacco: Never Used  . Alcohol Use: 21.6 oz/week    36 Cans of beer per week     Comment: 6 beers/day; trying to quit, participating in [place]  . Drug Use: No  . Sexual Activity: Yes    Birth Control/ Protection: None   Other Topics Concern  . Not on file   Social History Narrative   Lives with mother & 2 children   disabled    Review of Systems: Gen: Denies any fever, chills, sweats. Admits to anorexia, and malaise. CV: Denies chest pain, angina, palpitations, syncope, orthopnea, PND, peripheral edema, and claudication. Resp: Positive for shortness of breath, denies wheezing. GI: Denies vomiting blood, jaundice, and fecal incontinence.   Denies dysphagia or odynophagia. Reports mild abdominal pain GU : Denies urinary burning, blood in urine, urinary frequency, urinary hesitancy, nocturnal urination, and urinary incontinence. MS: Denies joint pain, limitation of movement, and swelling,  stiffness, low back pain, extremity pain. Denies muscle weakness, cramps, atrophy.  Derm: Denies rash, itching, dry skin, hives, moles, warts, or unhealing ulcers.  Psych: Admits to history of anxiety Heme: Denies bruising, bleeding, and enlarged lymph nodes. Neuro:  Denies any headaches, dizziness, paresthesias. Endo:  Denies any problems with DM, thyroid, adrenal function.  Physical Exam: Vital signs in last 24 hours: Temp:  [97.7 F (36.5 C)] 97.7 F (36.5 C) (12/12 1325) Pulse Rate:  [83-124] 83 (12/12 1500) Resp:  [13-20] 13 (12/12 1500) BP: (112-124)/(79-99) 123/99 mmHg (12/12 1500) SpO2:  [93 %-98 %] 98 % (12/12 1500)   General:   Alert, thin Caucasian female, pleasant and cooperative in NAD Head:  Normocephalic and atraumatic. Eyes:  Sclera clear, no icterus   Conjunctiva pink. Ears:  Normal auditory acuity. Nose:  No deformity, discharge,  or lesions. Mouth:  No deformity or lesions.   Neck:  Supple; no masses or thyromegaly. Lungs:  Clear throughout to auscultation.   No wheezes, crackles, or rhonchi.  Heart:  Regular rate and rhythm; no murmurs, clicks, rubs,  or gallops. Abdomen:  Soft, mild right upper quadrant tenderness to palpation with no rebound or guarding, BS active,nonpalp mass or hsm.  PEG in place. Rectal:  Deferred  Msk:  Symmetrical without gross deformities. . Pulses:  Normal pulses noted. Extremities:  Without clubbing or edema. Neurologic: Alert and  oriented x4;  grossly normal neurologically. Skin:  Intact without significant lesions or rashes.. Psych:  Alert and cooperative. Normal mood and affect.    Lab Results:  Recent Labs  08/08/15 1342  WBC 7.4  HGB 17.6*  HCT 49.4*  PLT 128*   BMET  Recent Labs  08/08/15 1342  NA 130*  K 3.2*  CL 89*  CO2 29  GLUCOSE 142*  BUN 21*  CREATININE 0.74  CALCIUM 8.6*   LFT  Recent Labs  08/08/15 1342  PROT 7.5  ALBUMIN 3.7  AST 1130*  ALT 425*  ALKPHOS 206*  BILITOT 0.9    PT/INR  Recent Labs  08/08/15 1538  LABPROT 15.7*  INR 1.24  Studies/Results: Dg Chest 2 View  08/08/2015  CLINICAL DATA:  Shortness of breath, nausea and vomiting. EXAM: CHEST - 2 VIEW COMPARISON:  07/09/2015 FINDINGS: The heart size and mediastinal contours are within normal limits. There is no evidence of pulmonary edema, consolidation, pneumothorax, nodule or pleural fluid. The visualized skeletal structures are unremarkable. IMPRESSION: No active disease. Electronically Signed   By: Aletta Edouard M.D.   On: 08/08/2015 14:06   Ct Abdomen Pelvis W Contrast  08/08/2015  CLINICAL DATA:  29 year old female with history of weakness, shortness of breath, nausea and vomiting for the past 5 days. Failure to thrive. EXAM: CT ABDOMEN AND PELVIS WITH CONTRAST TECHNIQUE: Multidetector CT imaging of the abdomen and pelvis was performed using the standard protocol following bolus administration of intravenous contrast. CONTRAST:  17m OMNIPAQUE IOHEXOL 300 MG/ML SOLN, 842mOMNIPAQUE IOHEXOL 300 MG/ML SOLN COMPARISON:  CT the abdomen and pelvis 03/01/2015. FINDINGS: Lower chest:  Unremarkable. Hepatobiliary: Mild diffuse decreased attenuation throughout the hepatic parenchyma, suggestive of hepatic steatosis (difficult to say for certain on today's contrast enhanced CT examination). No cystic or solid hepatic lesions. No intrahepatic biliary ductal dilatation. Common hepatic duct is dilated measuring 8 mm, and cystic duct is dilated measuring 6 mm. However, the common bile duct is normal in caliber measuring 7 x 5 mm (mean diameter of 6 mm). No stone in the distal common bile duct are identified. No gallstones in the gallbladder. Gallbladder is mildly distended, but otherwise normal in appearance. Pancreas: Type 1 pancreatic divisum again noted. No pancreatic mass. No pancreatic ductal dilatation. No pancreatic or peripancreatic fluid or inflammatory changes. Spleen: Unremarkable. Adrenals/Urinary  Tract: Bilateral kidneys and bilateral adrenal glands are normal in appearance. No hydroureteronephrosis. Urinary bladder is completely decompressed. Stomach/Bowel: Percutaneous gastrostomy tube in position with retention balloon in place in the distal body of the stomach. No pathologic dilatation of small bowel or colon. Normal appendix. Vascular/Lymphatic: Minimal atherosclerosis in the abdominal and pelvic vasculature, without evidence of aneurysm or dissection. No lymphadenopathy noted in the abdomen or pelvis. Reproductive: Uterus and ovaries are unremarkable in appearance. Other: No significant volume of ascites.  No pneumoperitoneum. Musculoskeletal: There are no aggressive appearing lytic or blastic lesions noted in the visualized portions of the skeleton. IMPRESSION: 1. No acute findings in the abdomen or pelvis to account for the patient's symptoms. 2. Chronic mild dilatation of the common hepatic duct and cystic duct, without associated intrahepatic biliary ductal dilatation to indicate significant biliary tree obstruction. This is similar to numerous prior examinations. 3. Type 1 pancreatic divisum redemonstrated. 4. Normal appendix. 5. Additional incidental findings, as above. Electronically Signed   By: DaVinnie Langton.D.   On: 08/08/2015 15:40    IMPRESSION/PLAN: [age]ear old female with a history of alcohol abuse and sedative abuse, amongst a history of failure to thrive, status post peg placement, admitted with a one-week history of vomiting, diarrhea, cough, and congestion. Abdominal CT negative for acute pathology. Labs suggestive of alcoholic hepatitis. Patient has been advised to abstain from alcohol. Would check ethanol level. CIWA protocol. Trend LFTs. Discriminant function approximately 3, not a candidate for corticosteroid therapy. Twice a day PPI. Antiemetics around-the-clock. IV fluids. Bowel rest. Check stool for C. Difficile. Will review with attending as to further  recommendations.   Shawntrice Salle, LoDeloris Ping2/07/2015,  Pager [phone]-Fri 8a-5p [phone] 5p, weekends, holidays

## 2015-08-08 NOTE — H&P (Addendum)
[facility] History and Physical  Krystie Leiter DDU:[id number] DOB: [birth year] DOA: 08/08/2015  Referring physician: Dr Tyrone Nine PCP: Leonides Sake, MD   Chief Complaint: Nausea, vomiting.   HPI: Kristine Macias is a 29 y.o. female with PMH significant for FTT, Alcohol abuse, Severe Malnutrition S/P peg tube placement who was refer by PCP to ED for admission for dehydration. Patient with nausea, vomiting, and abdominal pain. She has been drinking alcohol. Last drink was yesterday. She is worried about withdrawal. She report loose stool for last 2 days. She relates mild dyspnea. She continue to smoke.   Evaluation in the ED; CT abdomen with no acute abnormalities, LFT elevated AST 1130, ALK phosphate 206, Hb at 17.     Review of Systems:  Negative, except as per HPI.   Past Medical History  Diagnosis Date  . Anxiety   . Chlamydia 2007  . Irregular heart beat 2010    wt/diet related after evaluation  . Renal disorder   . Pneumothorax, spontaneous, tension   . Alcohol abuse   . Pancreas divisum   . Failure to thrive in adult    Past Surgical History  Procedure Laterality Date  . Cesarean section    . Chest tube insertion    . Esophagogastroduodenoscopy N/A 01/06/2015    Procedure: ESOPHAGOGASTRODUODENOSCOPY (EGD);  Surgeon: Josefine Class, MD;  Location[facility] END[facility]COPY;  Service: Endoscopy;  Laterality: N/A;  . Colonoscopy with propofol N/A 01/06/2015    Procedure: COLONOSCOPY WITH PROPOFOL;  Surgeon: Josefine Class, MD;  Location: [facility] ENDOSCOPY;  Service: Endoscopy;  Laterality: N/A;  . Peg placement N/A 01/11/2015    Procedure: PERCUTANEOUS ENDOSCOPIC GASTROSTOMY (PEG) PLACEMENT;  Surgeon: Josefine Class, MD;  Location: [facility] ENDOSCOPY;  Service: Endoscopy;  Laterality: N/A;  . Esophagogastroduodenoscopy N/A 01/11/2015    Rein-normal with PEG placement   Social History:  reports that she has been smoking Cigarettes.  She has been smoking about  2.00 packs per day. She has never used smokeless tobacco. She reports that she drinks about 21.6 oz of alcohol per week. She reports that she does not use illicit drugs.  Allergies  Allergen Reactions  . Aspirin Shortness Of Breath  . Effexor [Venlafaxine] Anaphylaxis  . Sulfa Antibiotics Anaphylaxis  . Tetracyclines & Related Anaphylaxis  . Trazodone And Nefazodone Anaphylaxis  . Latex Hives, Itching and Rash  . Paxil [Paroxetine] Hives, Itching and Rash  . Seroquel [Quetiapine Fumarate] Hives, Itching and Rash  . Zoloft [Sertraline Hcl] Hives, Itching and Rash    Family History  Problem Relation Age of Onset  . Anesthesia problems Neg Hx   . Thyroid disease Mother   . Cancer Father   . Stroke Other   . Crohn's disease Brother     Prior to Admission medications   Medication Sig Start Date End Date Taking? Authorizing Provider  CALCIUM-VITAMIN D PO Take 1 tablet by mouth daily.   Yes Historical Provider, MD  clonazePAM (KLONOPIN) 0.5 MG tablet Take 0.5 mg by mouth 2 (two) times daily. 08/01/15  Yes Historical Provider, MD  folic acid (FOLVITE) 1 MG tablet Take 1 tablet (1 mg total) by mouth daily. 07/14/15  Yes Theodis Blaze, MD  LORazepam (ATIVAN) 1 MG tablet Take 1 tablet (1 mg total) by mouth every 8 (eight) hours as needed for anxiety. 07/14/15  Yes Theodis Blaze, MD  Magnesium Oxide 400 (240 MG) MG TABS Take 400 mg by mouth 2 (two) times daily. 07/14/15  Yes Iskra  Barbera Setters, MD  Multiple Vitamin (MULTIVITAMIN WITH MINERALS) TABS tablet Take 1 tablet by mouth daily.   Yes Historical Provider, MD  Nutritional Supplements (FEEDING SUPPLEMENT, JEVITY 1.5 CAL/FIBER,) LIQD Place 237 mLs into feeding tube 4 (four) times daily. 07/02/15  Yes Aldean Jewett, MD  omeprazole (PRILOSEC) 20 MG capsule Take 20 mg by mouth daily.   Yes Historical Provider, MD  ondansetron (ZOFRAN) 4 MG tablet Take 4 mg by mouth every 6 (six) hours as needed for nausea or vomiting.    Yes Historical Provider,  MD  potassium chloride (K-DUR) 10 MEQ tablet Take 1 tablet (10 mEq total) by mouth 2 (two) times daily. 04/18/15  Yes Loletha Grayer, MD  thiamine 100 MG tablet Take 1 tablet (100 mg total) by mouth daily. 07/14/15  Yes Theodis Blaze, MD  Nutritional Supplements (FEEDING SUPPLEMENT, JEVITY 1.5 CAL/FIBER,) LIQD Place 240 mLs into feeding tube 4 (four) times daily. Patient not taking: Reported on 06/21/2015 04/18/15   Loletha Grayer, MD  Water For Irrigation, Sterile (FREE WATER) SOLN Place 200 mLs into feeding tube every 8 (eight) hours. Patient not taking: Reported on 06/21/2015 05/23/15   Gladstone Lighter, MD   Physical Exam: Filed Vitals:   08/08/15 1430 08/08/15 1500 08/08/15 1600 08/08/15 1630  BP: 124/99 123/99 111/85 124/92  Pulse: 86 83 85 84  Temp:      TempSrc:      Resp: _0 SpO2: 93% 98% 95% 95%    Wt Readings from Last 3 Encounters:  07/12/15 40.1 kg (88 lb 6.5 oz)  07/07/15 39.463 kg (87 lb)  07/01/15 39.282 kg (86 lb 9.6 oz)    General:  Appears calm and comfortable, cachetic.  Eyes: PERRL, normal lids, irises & conjunctiva ENT: grossly normal hearing, lips & tongue Neck: no LAD, masses or thyromegaly Cardiovascular: RRR, no m/r/g. No LE edema. Telemetry: SR, no arrhythmias  Respiratory: CTA bilaterally, no w/r/r. Normal respiratory effort. Abdomen: soft, ntnd, PEG tube in place.  Skin: no rash or induration seen on limited exam Musculoskeletal: grossly normal tone BUE/BLE Psychiatric: grossly normal mood and affect, speech fluent and appropriate Neurologic: grossly non-focal.          Labs on Admission:  Basic Metabolic Panel:  Recent Labs Lab 08/08/15 1342  NA 130*  K 3.2*  CL 89*  CO2 29  GLUCOSE 142*  BUN 21*  CREATININE 0.74  CALCIUM 8.6*   Liver Function Tests:  Recent Labs Lab 08/08/15 1342  AST 1130*  ALT 425*  ALKPHOS 206*  BILITOT 0.9  PROT 7.5  ALBUMIN 3.7    Recent Labs Lab 08/08/15 1342  LIPASE 25   No  results for input(s): AMMONIA in the last 168 hours. CBC:  Recent Labs Lab 08/08/15 1342  WBC 7.4  HGB 17.6*  HCT 49.4*  MCV 93.6  PLT 128*   Cardiac Enzymes: No results for input(s): CKTOTAL, CKMB, CKMBINDEX, TROPONINI in the last 168 hours.  BNP (last 3 results) No results for input(s): BNP in the last 8760 hours.  ProBNP (last 3 results) No results for input(s): PROBNP in the last 8760 hours.  CBG: No results for input(s): GLUCAP in the last 168 hours.  Radiological Exams on Admission: Dg Chest 2 View  08/08/2015  CLINICAL DATA:  Shortness of breath, nausea and vomiting. EXAM: CHEST - 2 VIEW COMPARISON:  07/09/2015 FINDINGS: The heart size and mediastinal contours are within normal limits. There is no evidence of pulmonary edema, consolidation,  pneumothorax, nodule or pleural fluid. The visualized skeletal structures are unremarkable. IMPRESSION: No active disease. Electronically Signed   By: Aletta Edouard M.D.   On: 08/08/2015 14:06   Ct Abdomen Pelvis W Contrast  08/08/2015  CLINICAL DATA:  29 year old female with history of weakness, shortness of breath, nausea and vomiting for the past 5 days. Failure to thrive. EXAM: CT ABDOMEN AND PELVIS WITH CONTRAST TECHNIQUE: Multidetector CT imaging of the abdomen and pelvis was performed using the standard protocol following bolus administration of intravenous contrast. CONTRAST:  51m OMNIPAQUE IOHEXOL 300 MG/ML SOLN, 818mOMNIPAQUE IOHEXOL 300 MG/ML SOLN COMPARISON:  CT the abdomen and pelvis 03/01/2015. FINDINGS: Lower chest:  Unremarkable. Hepatobiliary: Mild diffuse decreased attenuation throughout the hepatic parenchyma, suggestive of hepatic steatosis (difficult to say for certain on today's contrast enhanced CT examination). No cystic or solid hepatic lesions. No intrahepatic biliary ductal dilatation. Common hepatic duct is dilated measuring 8 mm, and cystic duct is dilated measuring 6 mm. However, the common bile duct is  normal in caliber measuring 7 x 5 mm (mean diameter of 6 mm). No stone in the distal common bile duct are identified. No gallstones in the gallbladder. Gallbladder is mildly distended, but otherwise normal in appearance. Pancreas: Type 1 pancreatic divisum again noted. No pancreatic mass. No pancreatic ductal dilatation. No pancreatic or peripancreatic fluid or inflammatory changes. Spleen: Unremarkable. Adrenals/Urinary Tract: Bilateral kidneys and bilateral adrenal glands are normal in appearance. No hydroureteronephrosis. Urinary bladder is completely decompressed. Stomach/Bowel: Percutaneous gastrostomy tube in position with retention balloon in place in the distal body of the stomach. No pathologic dilatation of small bowel or colon. Normal appendix. Vascular/Lymphatic: Minimal atherosclerosis in the abdominal and pelvic vasculature, without evidence of aneurysm or dissection. No lymphadenopathy noted in the abdomen or pelvis. Reproductive: Uterus and ovaries are unremarkable in appearance. Other: No significant volume of ascites.  No pneumoperitoneum. Musculoskeletal: There are no aggressive appearing lytic or blastic lesions noted in the visualized portions of the skeleton. IMPRESSION: 1. No acute findings in the abdomen or pelvis to account for the patient's symptoms. 2. Chronic mild dilatation of the common hepatic duct and cystic duct, without associated intrahepatic biliary ductal dilatation to indicate significant biliary tree obstruction. This is similar to numerous prior examinations. 3. Type 1 pancreatic divisum redemonstrated. 4. Normal appendix. 5. Additional incidental findings, as above. Electronically Signed   By: DaVinnie Langton.D.   On: 08/08/2015 15:40    EKG: none available.   Assessment/Plan Active Problems:   Alcohol use disorder, severe, dependence (HCC)   Protein-calorie malnutrition, severe (HCC)   Alcohol abuse   Failure to thrive in adult   Thrombocytopenia (HCC)    Hyponatremia   Hepatitis  1-Alcoholic Hepatitis. Transaminases.  IV fluids.  GI consulted.  Repeat Labs in am.  SW consulted.   2-Alcohol abuse. Concern for alcohol withdrawal. She has had seizure from alcohol withdrawal.  CIWA protocol.   3-Anxiety; Patient relates that she takes 2 mg ativan Q 4 hours PRN for anxiety. Will order CIWA protocol. Will need to resume home dose when off of CIWA protocol.    4-Hyponatremia, dehydration; Hb at 17, Sodium low at 130.  IV fluids.  Repeat labs in am.   5-Nausea, vomiting; IV Protonix, antiemetics. Clear diet   6-Severe Protein Malnutrition:  Nutritionist consulted.   Code Status: presume Full code.  DVT Prophylaxis: SCD.  Family Communication: Care discussed with patient.  Disposition Plan: expect 2 to 3 days inpatient.   Time spent:  75 minutes.   Niel Hummer A [facility] Pager [phone]

## 2015-08-08 NOTE — ED Notes (Signed)
Awake. Verbally responsive. A/O x4. Resp even and unlabored. No audible adventitious breath sounds noted. ABC's intact. IV saline lock patent and intact.

## 2015-08-08 NOTE — ED Notes (Signed)
Admitting MD at bedside.

## 2015-08-09 ENCOUNTER — Encounter (HOSPITAL_COMMUNITY): Payer: Self-pay | Admitting: Physician Assistant

## 2015-08-09 DIAGNOSIS — F1020 Alcohol dependence, uncomplicated: Secondary | ICD-10-CM

## 2015-08-09 DIAGNOSIS — K7010 Alcoholic hepatitis without ascites: Principal | ICD-10-CM

## 2015-08-09 DIAGNOSIS — R627 Adult failure to thrive: Secondary | ICD-10-CM

## 2015-08-09 DIAGNOSIS — E43 Unspecified severe protein-calorie malnutrition: Secondary | ICD-10-CM

## 2015-08-09 LAB — CBC
HCT: 39.4 % (ref 36.0–46.0)
Hemoglobin: 13.3 g/dL (ref 12.0–15.0)
MCH: 32.2 pg (ref 26.0–34.0)
MCHC: 33.8 g/dL (ref 30.0–36.0)
MCV: 95.4 fL (ref 78.0–100.0)
PLATELETS: 82 10*3/uL — AB (ref 150–400)
RBC: 4.13 MIL/uL (ref 3.87–5.11)
RDW: 12.5 % (ref 11.5–15.5)
WBC: 7.1 10*3/uL (ref 4.0–10.5)

## 2015-08-09 LAB — COMPREHENSIVE METABOLIC PANEL
ALK PHOS: 157 U/L — AB (ref 38–126)
ALT: 333 U/L — AB (ref 14–54)
AST: 1030 U/L — ABNORMAL HIGH (ref 15–41)
Albumin: 2.9 g/dL — ABNORMAL LOW (ref 3.5–5.0)
Anion gap: 12 (ref 5–15)
BUN: 16 mg/dL (ref 6–20)
CALCIUM: 7.4 mg/dL — AB (ref 8.9–10.3)
CO2: 22 mmol/L (ref 22–32)
CREATININE: 0.68 mg/dL (ref 0.44–1.00)
Chloride: 96 mmol/L — ABNORMAL LOW (ref 101–111)
GFR calc non Af Amer: >60 mL/min (ref >60)
GLUCOSE: 60 mg/dL — AB (ref 65–99)
Potassium: 3 mmol/L — ABNORMAL LOW (ref 3.5–5.1)
SODIUM: 130 mmol/L — AB (ref 135–145)
Total Bilirubin: 2 mg/dL — ABNORMAL HIGH (ref 0.3–1.2)
Total Protein: 5.8 g/dL — ABNORMAL LOW (ref 6.5–8.1)

## 2015-08-09 LAB — HEPATITIS PANEL, ACUTE
HCV Ab: 0.3 s/co ratio (ref 0.0–0.9)
HEP B S AG: NEGATIVE
Hep A IgM: NEGATIVE
Hep B C IgM: NEGATIVE

## 2015-08-09 MED ORDER — LIP MEDEX EX OINT
TOPICAL_OINTMENT | CUTANEOUS | Status: DC | PRN
  Administered 2015-08-09: 21:00:00 via TOPICAL
  Filled 2015-08-09: qty 7

## 2015-08-09 MED ORDER — POTASSIUM CHLORIDE CRYS ER 20 MEQ PO TBCR
40.0000 meq | EXTENDED_RELEASE_TABLET | ORAL | Status: AC
  Administered 2015-08-09 (×2): 40 meq via ORAL
  Filled 2015-08-09 (×2): qty 2

## 2015-08-09 MED ORDER — MAGNESIUM SULFATE 2 GM/50ML IV SOLN
2.0000 g | Freq: Once | INTRAVENOUS | Status: AC
Start: 2015-08-09 — End: 2015-08-09
  Administered 2015-08-09: 2 g via INTRAVENOUS
  Filled 2015-08-09: qty 50

## 2015-08-09 NOTE — Progress Notes (Signed)
PROGRESS NOTE  Kristine Macias GHW:[id number] DOB: [birth year] DOA: 08/08/2015 PCP: Leonides Sake, MD  Assessment/Plan: Alcoholic Hepatitis. Transaminases.  IV fluids.  GI consulted.  Repeat Labs trending down SW consulted.   Alcohol abuse. Concern for alcohol withdrawal. She has had seizure from alcohol withdrawal.  CIWA protocol.   Anxiety; Patient relates that she takes 2 mg ativan Q 4 hours PRN for anxiety. Will order CIWA protocol. Will need to resume home dose when off of CIWA protocol.   Hyponatremia, dehydration; IVF  Nausea, vomiting; IV Protonix, antiemetics. Clear diet   Severe Protein Malnutrition:  Nutritionist consulted.   Hypokalemia -replace Mg, replace  Code Status: full Family Communication:  patient Disposition Plan:    Consultants:  gi  Procedures:      HPI/Subjective: hungry  Objective: Filed Vitals:   08/08/15 2119 08/09/15 0649  BP: 128/84 107/67  Pulse: 79 83  Temp: 97.9 F (36.6 C) 97.9 F (36.6 C)  Resp: 16 16    Intake/Output Summary (Last 24 hours) at 08/09/15 1152 Last data filed at 08/09/15 1125  Gross per 24 hour  Intake    600 ml  Output      0 ml  Net    600 ml   Filed Weights   08/08/15 1821  Weight: 42.1 kg (92 lb 13 oz)    Exam:   General:  awake  Cardiovascular: rrr  Respiratory: clear  Abdomen: +BS, soft  Musculoskeletal: no edema   Data Reviewed: Basic Metabolic Panel:  Recent Labs Lab 08/08/15 1342 08/09/15 0430  NA 130* 130*  K 3.2* 3.0*  CL 89* 96*  CO2 29 22  GLUCOSE 142* 60*  BUN 21* 16  CREATININE 0.74 0.68  CALCIUM 8.6* 7.4*   Liver Function Tests:  Recent Labs Lab 08/08/15 1342 08/09/15 0430  AST 1130* 1030*  ALT 425* 333*  ALKPHOS 206* 157*  BILITOT 0.9 2.0*  PROT 7.5 5.8*  ALBUMIN 3.7 2.9*    Recent Labs Lab 08/08/15 1342  LIPASE 25   No results for input(s): AMMONIA in the last 168 hours. CBC:  Recent Labs Lab 08/08/15 1342  08/09/15 0430  WBC 7.4 7.1  HGB 17.6* 13.3  HCT 49.4* 39.4  MCV 93.6 95.4  PLT 128* 82*   Cardiac Enzymes:  Recent Labs Lab 08/08/15 1757  CKTOTAL 59   BNP (last 3 results) No results for input(s): BNP in the last 8760 hours.  ProBNP (last 3 results) No results for input(s): PROBNP in the last 8760 hours.  CBG: No results for input(s): GLUCAP in the last 168 hours.  No results found for this or any previous visit (from the past 240 hour(s)).   Studies: Dg Chest 2 View  08/08/2015  CLINICAL DATA:  Shortness of breath, nausea and vomiting. EXAM: CHEST - 2 VIEW COMPARISON:  07/09/2015 FINDINGS: The heart size and mediastinal contours are within normal limits. There is no evidence of pulmonary edema, consolidation, pneumothorax, nodule or pleural fluid. The visualized skeletal structures are unremarkable. IMPRESSION: No active disease. Electronically Signed   By: Aletta Edouard M.D.   On: 08/08/2015 14:06   Ct Abdomen Pelvis W Contrast  08/08/2015  CLINICAL DATA:  29 year old female with history of weakness, shortness of breath, nausea and vomiting for the past 5 days. Failure to thrive. EXAM: CT ABDOMEN AND PELVIS WITH CONTRAST TECHNIQUE: Multidetector CT imaging of the abdomen and pelvis was performed using the standard protocol following bolus administration of intravenous contrast. CONTRAST:  23m OMNIPAQUE IOHEXOL  300 MG/ML SOLN, 41m OMNIPAQUE IOHEXOL 300 MG/ML SOLN COMPARISON:  CT the abdomen and pelvis 03/01/2015. FINDINGS: Lower chest:  Unremarkable. Hepatobiliary: Mild diffuse decreased attenuation throughout the hepatic parenchyma, suggestive of hepatic steatosis (difficult to say for certain on today's contrast enhanced CT examination). No cystic or solid hepatic lesions. No intrahepatic biliary ductal dilatation. Common hepatic duct is dilated measuring 8 mm, and cystic duct is dilated measuring 6 mm. However, the common bile duct is normal in caliber measuring 7 x 5 mm  (mean diameter of 6 mm). No stone in the distal common bile duct are identified. No gallstones in the gallbladder. Gallbladder is mildly distended, but otherwise normal in appearance. Pancreas: Type 1 pancreatic divisum again noted. No pancreatic mass. No pancreatic ductal dilatation. No pancreatic or peripancreatic fluid or inflammatory changes. Spleen: Unremarkable. Adrenals/Urinary Tract: Bilateral kidneys and bilateral adrenal glands are normal in appearance. No hydroureteronephrosis. Urinary bladder is completely decompressed. Stomach/Bowel: Percutaneous gastrostomy tube in position with retention balloon in place in the distal body of the stomach. No pathologic dilatation of small bowel or colon. Normal appendix. Vascular/Lymphatic: Minimal atherosclerosis in the abdominal and pelvic vasculature, without evidence of aneurysm or dissection. No lymphadenopathy noted in the abdomen or pelvis. Reproductive: Uterus and ovaries are unremarkable in appearance. Other: No significant volume of ascites.  No pneumoperitoneum. Musculoskeletal: There are no aggressive appearing lytic or blastic lesions noted in the visualized portions of the skeleton. IMPRESSION: 1. No acute findings in the abdomen or pelvis to account for the patient's symptoms. 2. Chronic mild dilatation of the common hepatic duct and cystic duct, without associated intrahepatic biliary ductal dilatation to indicate significant biliary tree obstruction. This is similar to numerous prior examinations. 3. Type 1 pancreatic divisum redemonstrated. 4. Normal appendix. 5. Additional incidental findings, as above. Electronically Signed   By: DVinnie LangtonM.D.   On: 08/08/2015 15:40    Scheduled Meds: . feeding supplement  1 Container Oral TID BM  . folic acid  1 mg Oral Daily  . free water  200 mL Per Tube 3 times per day  . LORazepam  0-4 mg Intravenous Q6H   Followed by  . [START ON 08/10/2015] LORazepam  0-4 mg Intravenous Q12H  . multivitamin  with minerals  1 tablet Oral Daily  . ondansetron  4 mg Intravenous Q6H  . pantoprazole (PROTONIX) IV  40 mg Intravenous Q12H  . potassium chloride  40 mEq Oral Q4H  . sodium chloride  3 mL Intravenous Q12H  . thiamine  100 mg Oral Daily   Or  . thiamine  100 mg Intravenous Daily   Continuous Infusions: . sodium chloride 125 mL/hr at 08/09/15 1030   Antibiotics Given (last 72 hours)    None      Active Problems:   Alcohol use disorder, severe, dependence (HCC)   Protein-calorie malnutrition, severe (HCC)   Alcohol abuse   Failure to thrive in adult   Thrombocytopenia (HCC)   Hyponatremia   Hepatitis   Nausea & vomiting   Alcoholic hepatitis without ascites    Time spent: 25 min    [place] [facility] Pager [phone] If 7PM-7AM, please contact night-coverage at [email or web address], password [facility]12/13/2016, 11:52 AM  LOS: 1 day

## 2015-08-09 NOTE — Progress Notes (Signed)
     [facility] Progress Note  Subjective:  Bili 2, transaminases trending down. Feels better and is hungry. Ethanol on admission 101.   Objective:  Vital signs in last 24 hours: Temp:  [97.7 F (36.5 C)-98.1 F (36.7 C)] 97.9 F (36.6 C) (12/13 0649) Pulse Rate:  [79-124] 83 (12/13 0649) Resp:  [13-20] 16 (12/13 0649) BP: (107-128)/(67-99) 107/67 mmHg (12/13 0649) SpO2:  [93 %-99 %] 98 % (12/13 0649) Weight:  [92 lb 13 oz (42.1 kg)] 92 lb 13 oz (42.1 kg) (12/12 1821) Last BM Date: 08/08/15 General:   Alert,  Well-developed,    in NAD Heart:  Regular rate and rhythm; no murmurs Pulm;lungs clear Abdomen:  Soft, mild RUQ TTP,  nondistended. Normal bowel sounds, without guarding, and without rebound.  PEG in place Extremities:  Without edema. Neurologic: Alert and  oriented x4;  grossly normal neurologically. Psych:  Alert and cooperative. Normal mood and affect.   Lab Results:  Recent Labs  08/08/15 1342 08/09/15 0430  WBC 7.4 7.1  HGB 17.6* 13.3  HCT 49.4* 39.4  PLT 128* 82*   BMET  Recent Labs  08/08/15 1342 08/09/15 0430  NA 130* 130*  K 3.2* 3.0*  CL 89* 96*  CO2 29 22  GLUCOSE 142* 60*  BUN 21* 16  CREATININE 0.74 0.68  CALCIUM 8.6* 7.4*   LFT  Recent Labs  08/09/15 0430  PROT 5.8*  ALBUMIN 2.9*  AST 1030*  ALT 333*  ALKPHOS 157*  BILITOT 2.0*      ASSESSMENT/PLAN:  29 year old female with a history of alcohol abuse and sedative abuse, amongst a history of failure to thrive, status post peg placement, admitted with a one-week history of vomiting, diarrhea, cough, and congestion. Abdominal CT negative for acute pathology. Labs suggestive of alcoholic hepatitis.Cont CIWA protocol. Postassium replacement has been ordered for hypokalemia.  Cont bid PPI. Will advance to soft diet. Trend LFTs.      LOS: 1 day   Hvozdovic, Vita Barley PA-C 08/09/2015, Pager [phone] Mon-Fri 8a-5p [phone] after 5p, weekends, holidays    [place]  GI Attending   I have taken an interval history, reviewed the chart and examined the patient. I agree with the Advanced Practitioner's note, impression and recommendations.    Gatha Mayer, MD, [facility] [phone] (pager) [phone] after 5 PM, weekends and holidays  08/09/2015 1:06 PM

## 2015-08-09 NOTE — Progress Notes (Signed)
Initial Nutrition Assessment  DOCUMENTATION CODES:   Severe malnutrition in context of chronic illness, Underweight  INTERVENTION:   -Continue Boost Breeze po TID, each supplement provides 250 kcal and 9 grams of protein -Encourage PO intake -RD to continue to monitor  NUTRITION DIAGNOSIS:   Malnutrition related to chronic illness as evidenced by severe depletion of body fat, severe depletion of muscle mass.  GOAL:   Patient will meet greater than or equal to 90% of their needs  MONITOR:   PO intake, Supplement acceptance, Labs, Weight trends, Skin, I & O's  REASON FOR ASSESSMENT:   Malnutrition Screening Tool, Other (Comment) (Low BMI)    ASSESSMENT:   29 y.o. female with PMH significant for FTT, Alcohol abuse, Severe Malnutrition S/P peg tube placement who was refer by PCP to ED for admission for dehydration. Patient with nausea, vomiting, and abdominal pain. She has been drinking alcohol. Last drink was yesterday. She is worried about withdrawal. She report loose stool for last 2 days. She relates mild dyspnea. She continue to smoke.   Pt reports being very hungry and eating 75% of her soft diet this morning (scrambled eggs and toast). Pt feels ready for a regular diet. Encouraged adequate protein intake. RD offered to provided daily snacks and other supplement options but pt declines all that was offered. Pt has been ordered [employer] and will keep this order in.  Pt with PEG tube but only uses it for fluids. Pt wants to have PEG removed in the future. May need to be fed through PEG if she loses weight or intake decreases.  Pt's weight is +4 lb since last admission in November.  Nutrition-Focused physical exam completed. Findings are severe fat depletion, severe muscle depletion, and no edema.   Labs reviewed: Low Na, K  Diet Order:  DIET SOFT Room service appropriate?: Yes; Fluid consistency:: Thin  Skin:  Reviewed, no issues  Last BM:  12/12  Height:   Ht  Readings from Last 1 Encounters:  08/08/15 5' 6"  (1.676 m)    Weight:   Wt Readings from Last 1 Encounters:  08/08/15 92 lb 13 oz (42.1 kg)    Ideal Body Weight:  59.1 kg  BMI:  Body mass index is 14.99 kg/(m^2).  Estimated Nutritional Needs:   Kcal:  1400-1600  Protein:  70-80g  Fluid:  1.8L/day  EDUCATION NEEDS:   Education needs addressed  Clayton Bibles, MS, RD, LDN Pager: [phone] After Hours Pager: [phone]

## 2015-08-10 DIAGNOSIS — R112 Nausea with vomiting, unspecified: Secondary | ICD-10-CM

## 2015-08-10 DIAGNOSIS — E871 Hypo-osmolality and hyponatremia: Secondary | ICD-10-CM

## 2015-08-10 DIAGNOSIS — F1010 Alcohol abuse, uncomplicated: Secondary | ICD-10-CM

## 2015-08-10 LAB — COMPREHENSIVE METABOLIC PANEL
ALK PHOS: 164 U/L — AB (ref 38–126)
ALT: 234 U/L — ABNORMAL HIGH (ref 14–54)
AST: 590 U/L — AB (ref 15–41)
Albumin: 2.6 g/dL — ABNORMAL LOW (ref 3.5–5.0)
Anion gap: 4 — ABNORMAL LOW (ref 5–15)
BILIRUBIN TOTAL: 1 mg/dL (ref 0.3–1.2)
BUN: 7 mg/dL (ref 6–20)
CALCIUM: 7.8 mg/dL — AB (ref 8.9–10.3)
CO2: 25 mmol/L (ref 22–32)
Chloride: 105 mmol/L (ref 101–111)
Creatinine, Ser: 0.57 mg/dL (ref 0.44–1.00)
GFR calc Af Amer: >60 mL/min (ref >60)
Glucose, Bld: 127 mg/dL — ABNORMAL HIGH (ref 65–99)
POTASSIUM: 3.8 mmol/L (ref 3.5–5.1)
Sodium: 134 mmol/L — ABNORMAL LOW (ref 135–145)
TOTAL PROTEIN: 5.4 g/dL — AB (ref 6.5–8.1)

## 2015-08-10 LAB — PROTIME-INR
INR: 1.1 (ref 0.00–1.49)
PROTHROMBIN TIME: 14.3 s (ref 11.6–15.2)

## 2015-08-10 LAB — CBC
HCT: 35.9 % — ABNORMAL LOW (ref 36.0–46.0)
Hemoglobin: 11.9 g/dL — ABNORMAL LOW (ref 12.0–15.0)
MCH: 32.4 pg (ref 26.0–34.0)
MCHC: 33.1 g/dL (ref 30.0–36.0)
MCV: 97.8 fL (ref 78.0–100.0)
Platelets: 56 10*3/uL — ABNORMAL LOW (ref 150–400)
RBC: 3.67 MIL/uL — ABNORMAL LOW (ref 3.87–5.11)
RDW: 12.4 % (ref 11.5–15.5)
WBC: 5 10*3/uL (ref 4.0–10.5)

## 2015-08-10 MED ORDER — BOOST / RESOURCE BREEZE PO LIQD: 1.0000 | Freq: Three times a day (TID) | ORAL | Status: DC

## 2015-08-10 MED ORDER — PANTOPRAZOLE SODIUM 40 MG PO TBEC
40.0000 mg | DELAYED_RELEASE_TABLET | Freq: Two times a day (BID) | ORAL | Status: DC
  Administered 2015-08-10: 40 mg via ORAL
  Filled 2015-08-10: qty 1

## 2015-08-10 MED ORDER — ONDANSETRON HCL 4 MG/2ML IJ SOLN: 4.0000 mg | Freq: Four times a day (QID) | INTRAMUSCULAR | Status: DC | PRN

## 2015-08-10 MED ORDER — LORAZEPAM 1 MG PO TABS: 1.0000 mg | ORAL_TABLET | Freq: Three times a day (TID) | ORAL | Status: DC | PRN

## 2015-08-10 NOTE — Progress Notes (Signed)
Nursing Discharge Summary  Patient ID: Kristine Macias MRN: [id number] DOB/AGE: [birth year] 29 y.o.  Admit date: 08/08/2015 Discharge date: 08/10/2015  Discharged Condition: good  Disposition: 01-Home or Self Care  Follow-up Information    Follow up with Medicaid [place] Access Covered Patient .   Why:  USE THIS WEBSITE TO ASSIST WITH UNDERSTANDING YOUR COVERAGE, RENEW APPLICATION [place] Medicaid Transportation to Dr appts if you are have full Medicaid: [phone], [phone] or [phone] Transportation Supervisor [phone]    Contact information:   As a Medicaid client you MUST contact DSS/SSI each time you change address, move to another [place] or another state to keep your address updated  [place]: [address], [place] [address] [email or web address]       Call REIN, Grace Blight, MD.   Specialty:  Gastroenterology   Why:  Please call once home to schedule appt for follow up in 2-3 weeks.   Contact information:   [facility] [place] [address] [phone]       Prescriptions Given: Prescription given for Ativan.  Patient verbalized understanding of follow up appointments and medications without further questions.    Means of Discharge: patient to be taken downstairs via wheelchair to be discharged home.   Signed: Buel Ream 08/10/2015, 4:28 PM

## 2015-08-10 NOTE — Progress Notes (Signed)
PROGRESS NOTE  Kristine Macias [name] NWG:[id number] DOB: 27-Sep-1985 DOA: 08/08/2015 PCP: Leonides Sake, MD  Assessment/Plan: Alcoholic Hepatitis. Transaminases.  IV fluids.  GI consulted.  Repeat Labs trending down SW consulted.   Alcohol abuse. Concern for alcohol withdrawal. She has had seizure from alcohol withdrawal.  CIWA protocol.   Anxiety; Patient relates that she takes 2 mg ativan Q 4 hours PRN for anxiety. Will order CIWA protocol. Will need to resume home dose when off of CIWA protocol.   Hyponatremia, dehydration; IVF  Nausea, vomiting; IV Protonix, antiemetics. Clear diet   Severe Protein Malnutrition:  Nutritionist consulted.   Hypokalemia -replace Mg, replace  Code Status: full Family Communication:  patient Disposition Plan: home in AM if ok with GI   Consultants:  gi  Procedures:      HPI/Subjective: Eating well, no nausea  Objective: Filed Vitals:   08/10/15 0545 08/10/15 1334  BP: 116/83 114/80  Pulse: 81 72  Temp: 97.7 F (36.5 C) 98.7 F (37.1 C)  Resp: 16 17    Intake/Output Summary (Last 24 hours) at 08/10/15 1413 Last data filed at 08/10/15 1335  Gross per 24 hour  Intake    555 ml  Output      0 ml  Net    555 ml   Filed Weights   08/08/15 1821  Weight: 42.1 kg (92 lb 13 oz)    Exam:   General:  awake  Cardiovascular: rrr  Respiratory: clear  Abdomen: +BS, soft  Musculoskeletal: no edema   Data Reviewed: Basic Metabolic Panel:  Recent Labs Lab 08/08/15 1342 08/09/15 0430 08/10/15 0350  NA 130* 130* 134*  K 3.2* 3.0* 3.8  CL 89* 96* 105  CO2 29 22 25   GLUCOSE 142* 60* 127*  BUN 21* 16 7  CREATININE 0.74 0.68 0.57  CALCIUM 8.6* 7.4* 7.8*   Liver Function Tests:  Recent Labs Lab 08/08/15 1342 08/09/15 0430 08/10/15 0350  AST 1130* 1030* 590*  ALT 425* 333* 234*  ALKPHOS 206* 157* 164*  BILITOT 0.9 2.0* 1.0  PROT 7.5 5.8* 5.4*  ALBUMIN 3.7 2.9* 2.6*    Recent Labs Lab  08/08/15 1342  LIPASE 25   No results for input(s): AMMONIA in the last 168 hours. CBC:  Recent Labs Lab 08/08/15 1342 08/09/15 0430 08/10/15 0350  WBC 7.4 7.1 5.0  HGB 17.6* 13.3 11.9*  HCT 49.4* 39.4 35.9*  MCV 93.6 95.4 97.8  PLT 128* 82* 56*   Cardiac Enzymes:  Recent Labs Lab 08/08/15 1757  CKTOTAL 59   BNP (last 3 results) No results for input(s): BNP in the last 8760 hours.  ProBNP (last 3 results) No results for input(s): PROBNP in the last 8760 hours.  CBG: No results for input(s): GLUCAP in the last 168 hours.  No results found for this or any previous visit (from the past 240 hour(s)).   Studies: Ct Abdomen Pelvis W Contrast  08/08/2015  CLINICAL DATA:  29 year old female with history of weakness, shortness of breath, nausea and vomiting for the past 5 days. Failure to thrive. EXAM: CT ABDOMEN AND PELVIS WITH CONTRAST TECHNIQUE: Multidetector CT imaging of the abdomen and pelvis was performed using the standard protocol following bolus administration of intravenous contrast. CONTRAST:  85m OMNIPAQUE IOHEXOL 300 MG/ML SOLN, 854mOMNIPAQUE IOHEXOL 300 MG/ML SOLN COMPARISON:  CT the abdomen and pelvis 03/01/2015. FINDINGS: Lower chest:  Unremarkable. Hepatobiliary: Mild diffuse decreased attenuation throughout the hepatic parenchyma, suggestive of hepatic steatosis (difficult to say for certain  on today's contrast enhanced CT examination). No cystic or solid hepatic lesions. No intrahepatic biliary ductal dilatation. Common hepatic duct is dilated measuring 8 mm, and cystic duct is dilated measuring 6 mm. However, the common bile duct is normal in caliber measuring 7 x 5 mm (mean diameter of 6 mm). No stone in the distal common bile duct are identified. No gallstones in the gallbladder. Gallbladder is mildly distended, but otherwise normal in appearance. Pancreas: Type 1 pancreatic divisum again noted. No pancreatic mass. No pancreatic ductal dilatation. No pancreatic  or peripancreatic fluid or inflammatory changes. Spleen: Unremarkable. Adrenals/Urinary Tract: Bilateral kidneys and bilateral adrenal glands are normal in appearance. No hydroureteronephrosis. Urinary bladder is completely decompressed. Stomach/Bowel: Percutaneous gastrostomy tube in position with retention balloon in place in the distal body of the stomach. No pathologic dilatation of small bowel or colon. Normal appendix. Vascular/Lymphatic: Minimal atherosclerosis in the abdominal and pelvic vasculature, without evidence of aneurysm or dissection. No lymphadenopathy noted in the abdomen or pelvis. Reproductive: Uterus and ovaries are unremarkable in appearance. Other: No significant volume of ascites.  No pneumoperitoneum. Musculoskeletal: There are no aggressive appearing lytic or blastic lesions noted in the visualized portions of the skeleton. IMPRESSION: 1. No acute findings in the abdomen or pelvis to account for the patient's symptoms. 2. Chronic mild dilatation of the common hepatic duct and cystic duct, without associated intrahepatic biliary ductal dilatation to indicate significant biliary tree obstruction. This is similar to numerous prior examinations. 3. Type 1 pancreatic divisum redemonstrated. 4. Normal appendix. 5. Additional incidental findings, as above. Electronically Signed   By: Vinnie Langton M.D.   On: 08/08/2015 15:40    Scheduled Meds: . feeding supplement  1 Container Oral TID BM  . folic acid  1 mg Oral Daily  . free water  200 mL Per Tube 3 times per day  . LORazepam  0-4 mg Intravenous Q6H   Followed by  . LORazepam  0-4 mg Intravenous Q12H  . multivitamin with minerals  1 tablet Oral Daily  . pantoprazole  40 mg Oral BID  . sodium chloride  3 mL Intravenous Q12H  . thiamine  100 mg Oral Daily   Or  . thiamine  100 mg Intravenous Daily   Continuous Infusions:   Antibiotics Given (last 72 hours)    None      Active Problems:   Alcohol use disorder, severe,  dependence (HCC)   Protein-calorie malnutrition, severe (HCC)   Alcohol abuse   Failure to thrive in adult   Thrombocytopenia (HCC)   Hyponatremia   Hepatitis   Nausea & vomiting   Alcoholic hepatitis without ascites    Time spent: 25 min    [place]  [facility] Pager [phone]. If 7PM-7AM, please contact night-coverage at [email or web address], password [facility] 08/10/2015, 2:13 PM  LOS: 2 days

## 2015-08-10 NOTE — Progress Notes (Signed)
Key Points: Use following P&T approved IV to PO non-antibiotic change policy.  Description contains the criteria that are approved Note: Policy Excludes:  Esophagectomy patientsPHARMACIST - PHYSICIAN COMMUNICATION DR:   Eliseo Squires CONCERNING: IV to Oral Route Change Policy  RECOMMENDATION: This patient is receiving protonix by the intravenous route.  Based on criteria approved by the Pharmacy and Therapeutics Committee, the intravenous medication(s) is/are being converted to the equivalent oral dose form(s).   DESCRIPTION: These criteria include:  The patient is eating (either orally or via tube) and/or has been taking other orally administered medications for a least 24 hours  The patient has no evidence of active gastrointestinal bleeding or impaired GI absorption (gastrectomy, short bowel, patient on TNA or NPO).  If you have questions about this conversion, please contact the Pharmacy Department  []   [phone] )  [facility] []   [phone] )  [facility]  []   [phone] )  [facility] [x]   [phone] )  [place], [place], [facility] 08/10/2015 8:24 AM

## 2015-08-10 NOTE — Care Management Note (Signed)
Case Management Note  Patient Details  Name: Fred Hammes MRN: [id number] Date of Birth: August 04, 1986  Subjective/Objective:        29 yo admitted with Hepatitis            Action/Plan: From home with family  Expected Discharge Date:   (unknown)               Expected Discharge Plan:  [place]  In-House Referral:     Discharge planning Services  CM Consult  Post Acute Care Choice:  Resumption of Svcs/PTA Provider Choice offered to:  Patient  DME Arranged:    DME Agency:     HH Arranged:  RN [place] Agency:  [place] [place] ([facility] is newly [facility])  Status of Service:  In process, will continue to follow  Medicare Important Message Given:    Date Medicare IM Given:    Medicare IM give by:    Date Additional Medicare IM Given:    Additional Medicare Important Message give by:     If discussed at [place] of Stay Meetings, dates discussed:    Additional Comments: Pt currently has [facility] home health care (previously [place]). Resumption orders written by MD. [facility] rep contacted to alert of orders and discharge for today. No other CM needs communicated. Lynnell Catalan, RN 08/10/2015, 3:52 PM

## 2015-08-10 NOTE — Discharge Summary (Signed)
Physician Discharge Summary  Kristine Macias [name] IRW:[id number] DOB: [birth year] DOA: 08/08/2015  PCP: Leonides Sake, MD  Admit date: 08/08/2015 Discharge date: 08/10/2015  Time spent: 35 minutes  Recommendations for Outpatient Follow-up:  1. Stop alcohol 2. CMP next week with PCP visit   Discharge Diagnoses:  Active Problems:   Alcohol use disorder, severe, dependence (HCC)   Protein-calorie malnutrition, severe ([place])   Alcohol abuse   Failure to thrive in adult   Thrombocytopenia (HCC)   Hyponatremia   Hepatitis   Nausea & vomiting   Alcoholic hepatitis without ascites   Discharge Condition: improved  Diet recommendation: regular  Filed Weights   08/08/15 1821  Weight: 42.1 kg (92 lb 13 oz)    History of present illness:  Kristine Macias is a 29 y.o. female with PMH significant for FTT, Alcohol abuse, Severe Malnutrition S/P peg tube placement who was refer by PCP to ED for admission for dehydration. Patient with nausea, vomiting, and abdominal pain. She has been drinking alcohol. Last drink was yesterday. She is worried about withdrawal. She report loose stool for last 2 days. She relates mild dyspnea. She continue to smoke.   Evaluation in the ED; CT abdomen with no acute abnormalities, LFT elevated AST 1130, ALK phosphate 206, Hb at 17.   Hospital Course:  Alcoholic Hepatitis. Transaminases.  IV fluids.  GI consulted.  Repeat Labs trending down  Alcohol abuse.  Takes ativan PRN at home  Anxiety; resume home PO ativan   Hyponatremia, dehydration; IVF  Nausea, vomiting; IV Protonix, antiemetics. Clear diet   Severe Protein Malnutrition:  Nutritionist consulted.   Hypokalemia -replace Mg, replace  Procedures:    Consultations:  GI  Discharge Exam: Filed Vitals:   08/10/15 0545 08/10/15 1334  BP: 116/83 114/80  Pulse: 81 72  Temp: 97.7 F (36.5 C) 98.7 F (37.1 C)  Resp: 16 17    General: awake, NAD   Discharge  Instructions   Discharge Instructions    Diet general    Complete by:  As directed      Discharge instructions    Complete by:  As directed   Stop alcohol CMP on Monday for LFTs     Increase activity slowly    Complete by:  As directed           Current Discharge Medication List    CONTINUE these medications which have CHANGED   Details  !! feeding supplement (BOOST / RESOURCE BREEZE) LIQD Take 1 Container by mouth 3 (three) times daily between meals. Refills: 0    LORazepam (ATIVAN) 1 MG tablet Take 1 tablet (1 mg total) by mouth every 8 (eight) hours as needed for anxiety. Qty: 20 tablet, Refills: 0     !! - Potential duplicate medications found. Please discuss with provider.    CONTINUE these medications which have NOT CHANGED   Details  CALCIUM-VITAMIN D PO Take 1 tablet by mouth daily.    clonazePAM (KLONOPIN) 0.5 MG tablet Take 0.5 mg by mouth 2 (two) times daily. Refills: 0    folic acid (FOLVITE) 1 MG tablet Take 1 tablet (1 mg total) by mouth daily. Qty: 30 tablet, Refills: 0    Magnesium Oxide 400 (240 MG) MG TABS Take 400 mg by mouth 2 (two) times daily. Qty: 60 tablet, Refills: 0    Multiple Vitamin (MULTIVITAMIN WITH MINERALS) TABS tablet Take 1 tablet by mouth daily.    !! Nutritional Supplements (FEEDING SUPPLEMENT, JEVITY 1.5 CAL/FIBER,) LIQD Place 237  mLs into feeding tube 4 (four) times daily. Qty: 30000 mL, Refills: 0    omeprazole (PRILOSEC) 20 MG capsule Take 20 mg by mouth daily.    ondansetron (ZOFRAN) 4 MG tablet Take 4 mg by mouth every 6 (six) hours as needed for nausea or vomiting.     potassium chloride (K-DUR) 10 MEQ tablet Take 1 tablet (10 mEq total) by mouth 2 (two) times daily. Qty: 60 tablet, Refills: 0    thiamine 100 MG tablet Take 1 tablet (100 mg total) by mouth daily. Qty: 30 tablet, Refills: 0    Water For Irrigation, Sterile (FREE WATER) SOLN Place 200 mLs into feeding tube every 8 (eight) hours. Qty: 1000 mL, Refills: 5      !! - Potential duplicate medications found. Please discuss with provider.     Allergies  Allergen Reactions  . Aspirin Shortness Of Breath  . Effexor [Venlafaxine] Anaphylaxis  . Sulfa Antibiotics Anaphylaxis  . Tetracyclines & Related Anaphylaxis  . Trazodone And Nefazodone Anaphylaxis  . Latex Hives, Itching and Rash  . Paxil [Paroxetine] Hives, Itching and Rash  . Seroquel [Quetiapine Fumarate] Hives, Itching and Rash  . Zoloft [Sertraline Hcl] Hives, Itching and Rash   Follow-up Information    Follow up with Medicaid [place] Access Covered Patient .   Why:  USE THIS WEBSITE TO ASSIST WITH UNDERSTANDING YOUR COVERAGE, RENEW APPLICATION [place] Medicaid Transportation to Dr appts if you are have full Medicaid: [phone], [phone] or [phone] Transportation Supervisor [phone]    Contact information:   As a Medicaid client you MUST contact DSS/SSI each time you change address, move to another [place] or another state to keep your address updated  [place]: [place], [place] [address] [email or web address]       Call REIN, Grace Blight, MD.   Specialty:  Gastroenterology   Why:  Please call once home to schedule appt for follow up in 2-3 weeks.   Contact information:   [place] [facility] [place] [address] [phone]        The results of significant diagnostics from this hospitalization (including imaging, microbiology, ancillary and laboratory) are listed below for reference.    Significant Diagnostic Studies: Dg Chest 2 View  08/08/2015  CLINICAL DATA:  Shortness of breath, nausea and vomiting. EXAM: CHEST - 2 VIEW COMPARISON:  07/09/2015 FINDINGS: The heart size and mediastinal contours are within normal limits. There is no evidence of pulmonary edema, consolidation, pneumothorax, nodule or pleural fluid. The visualized skeletal structures are unremarkable. IMPRESSION: No active disease.  Electronically Signed   By: Aletta Edouard M.D.   On: 08/08/2015 14:06   Ct Abdomen Pelvis W Contrast  08/08/2015  CLINICAL DATA:  29 year old female with history of weakness, shortness of breath, nausea and vomiting for the past 5 days. Failure to thrive. EXAM: CT ABDOMEN AND PELVIS WITH CONTRAST TECHNIQUE: Multidetector CT imaging of the abdomen and pelvis was performed using the standard protocol following bolus administration of intravenous contrast. CONTRAST:  44m OMNIPAQUE IOHEXOL 300 MG/ML SOLN, 823mOMNIPAQUE IOHEXOL 300 MG/ML SOLN COMPARISON:  CT the abdomen and pelvis 03/01/2015. FINDINGS: Lower chest:  Unremarkable. Hepatobiliary: Mild diffuse decreased attenuation throughout the hepatic parenchyma, suggestive of hepatic steatosis (difficult to say for certain on today's contrast enhanced CT examination). No cystic or solid hepatic lesions. No intrahepatic biliary ductal dilatation. Common hepatic duct is dilated measuring 8 mm, and cystic duct is dilated measuring 6 mm. However, the common  bile duct is normal in caliber measuring 7 x 5 mm (mean diameter of 6 mm). No stone in the distal common bile duct are identified. No gallstones in the gallbladder. Gallbladder is mildly distended, but otherwise normal in appearance. Pancreas: Type 1 pancreatic divisum again noted. No pancreatic mass. No pancreatic ductal dilatation. No pancreatic or peripancreatic fluid or inflammatory changes. Spleen: Unremarkable. Adrenals/Urinary Tract: Bilateral kidneys and bilateral adrenal glands are normal in appearance. No hydroureteronephrosis. Urinary bladder is completely decompressed. Stomach/Bowel: Percutaneous gastrostomy tube in position with retention balloon in place in the distal body of the stomach. No pathologic dilatation of small bowel or colon. Normal appendix. Vascular/Lymphatic: Minimal atherosclerosis in the abdominal and pelvic vasculature, without evidence of aneurysm or dissection. No lymphadenopathy  noted in the abdomen or pelvis. Reproductive: Uterus and ovaries are unremarkable in appearance. Other: No significant volume of ascites.  No pneumoperitoneum. Musculoskeletal: There are no aggressive appearing lytic or blastic lesions noted in the visualized portions of the skeleton. IMPRESSION: 1. No acute findings in the abdomen or pelvis to account for the patient's symptoms. 2. Chronic mild dilatation of the common hepatic duct and cystic duct, without associated intrahepatic biliary ductal dilatation to indicate significant biliary tree obstruction. This is similar to numerous prior examinations. 3. Type 1 pancreatic divisum redemonstrated. 4. Normal appendix. 5. Additional incidental findings, as above. Electronically Signed   By: Vinnie Langton M.D.   On: 08/08/2015 15:40    Microbiology: No results found for this or any previous visit (from the past 240 hour(s)).   Labs: Basic Metabolic Panel:  Recent Labs Lab 08/08/15 1342 08/09/15 0430 08/10/15 0350  NA 130* 130* 134*  K 3.2* 3.0* 3.8  CL 89* 96* 105  CO2 29 22 25   GLUCOSE 142* 60* 127*  BUN 21* 16 7  CREATININE 0.74 0.68 0.57  CALCIUM 8.6* 7.4* 7.8*   Liver Function Tests:  Recent Labs Lab 08/08/15 1342 08/09/15 0430 08/10/15 0350  AST 1130* 1030* 590*  ALT 425* 333* 234*  ALKPHOS 206* 157* 164*  BILITOT 0.9 2.0* 1.0  PROT 7.5 5.8* 5.4*  ALBUMIN 3.7 2.9* 2.6*    Recent Labs Lab 08/08/15 1342  LIPASE 25   No results for input(s): AMMONIA in the last 168 hours. CBC:  Recent Labs Lab 08/08/15 1342 08/09/15 0430 08/10/15 0350  WBC 7.4 7.1 5.0  HGB 17.6* 13.3 11.9*  HCT 49.4* 39.4 35.9*  MCV 93.6 95.4 97.8  PLT 128* 82* 56*   Cardiac Enzymes:  Recent Labs Lab 08/08/15 1757  CKTOTAL 59   BNP: BNP (last 3 results) No results for input(s): BNP in the last 8760 hours.  ProBNP (last 3 results) No results for input(s): PROBNP in the last 8760 hours.  CBG: No results for input(s): GLUCAP in  the last 168 hours.     Signed:  Doni Bacha UPCHURCH Barry [place]  [facility] 08/10/2015, 4:43 PM

## 2015-08-10 NOTE — Progress Notes (Signed)
     [facility] Progress Note  Subjective:   Feels better. No abd pain. No N/V. Tol diet. Wants to go home. She says she has an appt with her PCP this coming Monday. She plans on following with her GI MD in [place].  Objective:  Vital signs in last 24 hours: Temp:  [97.7 F (36.5 C)-98.7 F (37.1 C)] 98.7 F (37.1 C) (12/14 1334) Pulse Rate:  [72-88] 72 (12/14 1334) Resp:  [16-17] 17 (12/14 1334) BP: (111-116)/(73-83) 114/80 mmHg (12/14 1334) SpO2:  [97 %-100 %] 100 % (12/14 1334) Last BM Date: 08/08/15 General:   Alert,  Well-developed, in NAD Heart:  Regular rate and rhythm; no murmurs Pulm;lungs clear Abdomen:  Soft, mild RUQ TTP, nondistended. Normal bowel sounds, without guarding, and without rebound.  Extremities:  Without edema. Neurologic:  Alert and  oriented x4;  grossly normal neurologically. Psych:  Alert and cooperative. Normal mood and affect.   Lab Results:  Recent Labs  08/08/15 1342 08/09/15 0430 08/10/15 0350  WBC 7.4 7.1 5.0  HGB 17.6* 13.3 11.9*  HCT 49.4* 39.4 35.9*  PLT 128* 82* 56*   BMET  Recent Labs  08/08/15 1342 08/09/15 0430 08/10/15 0350  NA 130* 130* 134*  K 3.2* 3.0* 3.8  CL 89* 96* 105  CO2 29 22 25   GLUCOSE 142* 60* 127*  BUN 21* 16 7  CREATININE 0.74 0.68 0.57  CALCIUM 8.6* 7.4* 7.8*   LFT  Recent Labs  08/10/15 0350  PROT 5.4*  ALBUMIN 2.6*  AST 590*  ALT 234*  ALKPHOS 164*  BILITOT 1.0   PT/INR  Recent Labs  08/08/15 1538 08/10/15 1000  LABPROT 15.7* 14.3  INR 1.24 1.10      ASSESSMENT/PLAN:  29 year old female with a history of alcohol abuse and sedative abuse, amongst a history of failure to thrive, status post peg placement, admitted with a one-week history of vomiting, diarrhea, cough, and congestion. Abdominal CT negative for acute pathology. Labs suggestive of alcoholic hepatitis. Transaminases improving. From GI standpoint, ready to go home. Will need to follow with her substance  abuse therapist, and schedule f/u with her GI MD.   LOS: 2 days   Hvozdovic, Vita Barley PA-C 08/10/2015, Pager [phone] Mon-Fri 8a-5p [phone] after 5p, weekends, holidays  Agree w/ Ms. Hvozdovic's note and mangement. Gatha Mayer, MD, [facility]

## 2015-08-10 NOTE — Progress Notes (Signed)
Visitied pt room x 2 this morning. Sleeping soundly both times.LFTs improving, t bili improving. Platelets down to 56K. INR pending..  29 year old female with a history of alcohol abuse and sedative abuse, amongst a history of failure to thrive, status post peg placement, admitted with a one-week history of vomiting, diarrhea, cough, and congestion. Abdominal CT negative for acute pathology. Labs suggestive of alcoholic hepatitis.Cont CIWA protocol. Continue bid PPI. Advance diet as tolerated. Will revisit later today.  [facility] Pager [phone] Mon-Fri 8a-5p [phone] after 5p, weekends, holidays

## 2015-09-15 ENCOUNTER — Other Ambulatory Visit: Payer: Self-pay | Admitting: Gastroenterology

## 2015-09-15 DIAGNOSIS — R748 Abnormal levels of other serum enzymes: Secondary | ICD-10-CM

## 2015-09-22 ENCOUNTER — Ambulatory Visit: Admission: RE | Admit: 2015-09-22 | Payer: Medicaid Other | Source: Ambulatory Visit

## 2015-09-27 ENCOUNTER — Ambulatory Visit: Payer: Medicaid Other

## 2015-10-04 ENCOUNTER — Inpatient Hospital Stay
Admission: EM | Admit: 2015-10-04 | Discharge: 2015-10-05 | DRG: 193 | Disposition: A | Discharge status: Home / Self Care | Payer: Medicaid Other | Attending: Internal Medicine | Admitting: Internal Medicine

## 2015-10-04 ENCOUNTER — Ambulatory Visit: Admission: RE | Admit: 2015-10-04 | Payer: Medicaid Other | Source: Ambulatory Visit

## 2015-10-04 ENCOUNTER — Emergency Department: Payer: Medicaid Other

## 2015-10-04 ENCOUNTER — Encounter: Payer: Self-pay | Admitting: Emergency Medicine

## 2015-10-04 DIAGNOSIS — E878 Other disorders of electrolyte and fluid balance, not elsewhere classified: Secondary | ICD-10-CM | POA: Diagnosis present

## 2015-10-04 DIAGNOSIS — K7010 Alcoholic hepatitis without ascites: Secondary | ICD-10-CM | POA: Diagnosis present

## 2015-10-04 DIAGNOSIS — R740 Nonspecific elevation of levels of transaminase and lactic acid dehydrogenase [LDH]: Secondary | ICD-10-CM | POA: Diagnosis present

## 2015-10-04 DIAGNOSIS — Z888 Allergy status to other drugs, medicaments and biological substances status: Secondary | ICD-10-CM

## 2015-10-04 DIAGNOSIS — Z931 Gastrostomy status: Secondary | ICD-10-CM

## 2015-10-04 DIAGNOSIS — J189 Pneumonia, unspecified organism: Principal | ICD-10-CM | POA: Diagnosis present

## 2015-10-04 DIAGNOSIS — Z681 Body mass index (BMI) 19 or less, adult: Secondary | ICD-10-CM

## 2015-10-04 DIAGNOSIS — Z9889 Other specified postprocedural states: Secondary | ICD-10-CM

## 2015-10-04 DIAGNOSIS — F10239 Alcohol dependence with withdrawal, unspecified: Secondary | ICD-10-CM | POA: Diagnosis present

## 2015-10-04 DIAGNOSIS — E43 Unspecified severe protein-calorie malnutrition: Secondary | ICD-10-CM | POA: Diagnosis present

## 2015-10-04 DIAGNOSIS — Z91040 Latex allergy status: Secondary | ICD-10-CM

## 2015-10-04 DIAGNOSIS — F10939 Alcohol use, unspecified with withdrawal, unspecified: Secondary | ICD-10-CM | POA: Diagnosis present

## 2015-10-04 DIAGNOSIS — F17210 Nicotine dependence, cigarettes, uncomplicated: Secondary | ICD-10-CM | POA: Diagnosis present

## 2015-10-04 DIAGNOSIS — Z809 Family history of malignant neoplasm, unspecified: Secondary | ICD-10-CM

## 2015-10-04 DIAGNOSIS — D696 Thrombocytopenia, unspecified: Secondary | ICD-10-CM | POA: Diagnosis present

## 2015-10-04 DIAGNOSIS — Z79899 Other long term (current) drug therapy: Secondary | ICD-10-CM

## 2015-10-04 DIAGNOSIS — R627 Adult failure to thrive: Secondary | ICD-10-CM | POA: Diagnosis present

## 2015-10-04 DIAGNOSIS — K909 Intestinal malabsorption, unspecified: Secondary | ICD-10-CM | POA: Diagnosis present

## 2015-10-04 DIAGNOSIS — Z823 Family history of stroke: Secondary | ICD-10-CM

## 2015-10-04 LAB — COMPREHENSIVE METABOLIC PANEL
ALBUMIN: 2.5 g/dL — AB (ref 3.5–5.0)
ALK PHOS: 369 U/L — AB (ref 38–126)
ALT: 73 U/L — AB (ref 14–54)
AST: 257 U/L — AB (ref 15–41)
Anion gap: 8 (ref 5–15)
BILIRUBIN TOTAL: 4.2 mg/dL — AB (ref 0.3–1.2)
BUN: 8 mg/dL (ref 6–20)
CALCIUM: 7.9 mg/dL — AB (ref 8.9–10.3)
CO2: 32 mmol/L (ref 22–32)
Chloride: 92 mmol/L — ABNORMAL LOW (ref 101–111)
GLUCOSE: 122 mg/dL — AB (ref 65–99)
POTASSIUM: 3 mmol/L — AB (ref 3.5–5.1)
Sodium: 132 mmol/L — ABNORMAL LOW (ref 135–145)
TOTAL PROTEIN: 6.6 g/dL (ref 6.5–8.1)

## 2015-10-04 LAB — CBC
HEMATOCRIT: 31.2 % — AB (ref 35.0–47.0)
HEMOGLOBIN: 10.8 g/dL — AB (ref 12.0–16.0)
MCH: 34.1 pg — ABNORMAL HIGH (ref 26.0–34.0)
MCHC: 34.5 g/dL (ref 32.0–36.0)
MCV: 98.9 fL (ref 80.0–100.0)
Platelets: 86 10*3/uL — ABNORMAL LOW (ref 150–440)
RBC: 3.15 MIL/uL — ABNORMAL LOW (ref 3.80–5.20)
RDW: 19.6 % — AB (ref 11.5–14.5)
WBC: 9.3 10*3/uL (ref 3.6–11.0)

## 2015-10-04 LAB — BRAIN NATRIURETIC PEPTIDE: B NATRIURETIC PEPTIDE 5: 128 pg/mL — AB (ref 0.0–100.0)

## 2015-10-04 NOTE — ED Notes (Addendum)
Pt states her doctor told her she was dehydrated and that she has pneumonia - She also claims to be an alcoholic with elevated liver enzymes - Pt states cold chills and has been "feeling bad" for one week

## 2015-10-04 NOTE — ED Notes (Signed)
Pt arrived to the ED for complaints of chest pain secondary to cough. Pt reports that she has been experiencing the symptoms for the last 3 weeks getting intolerable on the last 3 days with a history of pneumothorax. Pt is AOx4 in no apparent distress.

## 2015-10-04 NOTE — ED Notes (Addendum)
Pt visitor taken back to sit with her in subwait per her request.

## 2015-10-05 DIAGNOSIS — J189 Pneumonia, unspecified organism: Secondary | ICD-10-CM | POA: Diagnosis present

## 2015-10-05 DIAGNOSIS — E878 Other disorders of electrolyte and fluid balance, not elsewhere classified: Secondary | ICD-10-CM | POA: Diagnosis present

## 2015-10-05 DIAGNOSIS — Z681 Body mass index (BMI) 19 or less, adult: Secondary | ICD-10-CM | POA: Diagnosis not present

## 2015-10-05 DIAGNOSIS — Z809 Family history of malignant neoplasm, unspecified: Secondary | ICD-10-CM | POA: Diagnosis not present

## 2015-10-05 DIAGNOSIS — F10239 Alcohol dependence with withdrawal, unspecified: Secondary | ICD-10-CM | POA: Diagnosis present

## 2015-10-05 DIAGNOSIS — R627 Adult failure to thrive: Secondary | ICD-10-CM | POA: Diagnosis present

## 2015-10-05 DIAGNOSIS — Z79899 Other long term (current) drug therapy: Secondary | ICD-10-CM | POA: Diagnosis not present

## 2015-10-05 DIAGNOSIS — Z823 Family history of stroke: Secondary | ICD-10-CM | POA: Diagnosis not present

## 2015-10-05 DIAGNOSIS — Z91040 Latex allergy status: Secondary | ICD-10-CM | POA: Diagnosis not present

## 2015-10-05 DIAGNOSIS — K909 Intestinal malabsorption, unspecified: Secondary | ICD-10-CM | POA: Diagnosis present

## 2015-10-05 DIAGNOSIS — Z888 Allergy status to other drugs, medicaments and biological substances status: Secondary | ICD-10-CM | POA: Diagnosis not present

## 2015-10-05 DIAGNOSIS — E43 Unspecified severe protein-calorie malnutrition: Secondary | ICD-10-CM | POA: Diagnosis present

## 2015-10-05 DIAGNOSIS — D696 Thrombocytopenia, unspecified: Secondary | ICD-10-CM | POA: Diagnosis present

## 2015-10-05 DIAGNOSIS — F17210 Nicotine dependence, cigarettes, uncomplicated: Secondary | ICD-10-CM | POA: Diagnosis present

## 2015-10-05 DIAGNOSIS — R740 Nonspecific elevation of levels of transaminase and lactic acid dehydrogenase [LDH]: Secondary | ICD-10-CM | POA: Diagnosis present

## 2015-10-05 DIAGNOSIS — Z931 Gastrostomy status: Secondary | ICD-10-CM | POA: Diagnosis not present

## 2015-10-05 DIAGNOSIS — K7010 Alcoholic hepatitis without ascites: Secondary | ICD-10-CM | POA: Diagnosis present

## 2015-10-05 DIAGNOSIS — Z9889 Other specified postprocedural states: Secondary | ICD-10-CM | POA: Diagnosis not present

## 2015-10-05 DIAGNOSIS — R000 Tachycardia, unspecified: Secondary | ICD-10-CM | POA: Diagnosis present

## 2015-10-05 LAB — COMPREHENSIVE METABOLIC PANEL
ALBUMIN: 2.2 g/dL — AB (ref 3.5–5.0)
ALT: 64 U/L — ABNORMAL HIGH (ref 14–54)
ANION GAP: 9 (ref 5–15)
AST: 252 U/L — AB (ref 15–41)
Alkaline Phosphatase: 314 U/L — ABNORMAL HIGH (ref 38–126)
BILIRUBIN TOTAL: 4.8 mg/dL — AB (ref 0.3–1.2)
BUN: 10 mg/dL (ref 6–20)
CO2: 28 mmol/L (ref 22–32)
Calcium: 7.1 mg/dL — ABNORMAL LOW (ref 8.9–10.3)
Chloride: 95 mmol/L — ABNORMAL LOW (ref 101–111)
Creatinine, Ser: 0.38 mg/dL — ABNORMAL LOW (ref 0.44–1.00)
GFR calc Af Amer: >60 mL/min (ref >60)
GFR calc non Af Amer: >60 mL/min (ref >60)
GLUCOSE: 81 mg/dL (ref 65–99)
POTASSIUM: 3.2 mmol/L — AB (ref 3.5–5.1)
SODIUM: 132 mmol/L — AB (ref 135–145)
Total Protein: 6 g/dL — ABNORMAL LOW (ref 6.5–8.1)

## 2015-10-05 LAB — PHOSPHORUS: PHOSPHORUS: 2.4 mg/dL — AB (ref 2.5–4.6)

## 2015-10-05 LAB — MAGNESIUM: MAGNESIUM: 1.5 mg/dL — AB (ref 1.7–2.4)

## 2015-10-05 LAB — MRSA PCR SCREENING: MRSA by PCR: NEGATIVE

## 2015-10-05 MED ORDER — PANTOPRAZOLE SODIUM 40 MG PO TBEC
40.0000 mg | DELAYED_RELEASE_TABLET | Freq: Every day | ORAL | Status: DC
  Administered 2015-10-05: 40 mg via ORAL
  Filled 2015-10-05 (×2): qty 1

## 2015-10-05 MED ORDER — POTASSIUM CHLORIDE 20 MEQ/15ML (10%) PO SOLN
40.0000 meq | Freq: Once | ORAL | Status: AC
  Administered 2015-10-05: 40 meq via ORAL
  Filled 2015-10-05: qty 30

## 2015-10-05 MED ORDER — FOLIC ACID 1 MG PO TABS: 1.0000 mg | ORAL_TABLET | Freq: Every day | ORAL | Status: DC

## 2015-10-05 MED ORDER — VITAMIN B-1 100 MG PO TABS: 100.0000 mg | ORAL_TABLET | Freq: Every day | ORAL | Status: DC

## 2015-10-05 MED ORDER — LORAZEPAM 2 MG PO TABS: 0.0000 mg | ORAL_TABLET | Freq: Two times a day (BID) | ORAL | Status: DC

## 2015-10-05 MED ORDER — LEVOFLOXACIN IN D5W 750 MG/150ML IV SOLN: 750.0000 mg | INTRAVENOUS | Status: DC

## 2015-10-05 MED ORDER — LEVOFLOXACIN IN D5W 750 MG/150ML IV SOLN: 750.0000 mg | Freq: Once | INTRAVENOUS | Status: DC

## 2015-10-05 MED ORDER — LORAZEPAM 2 MG PO TABS: 0.0000 mg | ORAL_TABLET | Freq: Four times a day (QID) | ORAL | Status: DC

## 2015-10-05 MED ORDER — ADULT MULTIVITAMIN W/MINERALS CH: 1.0000 | ORAL_TABLET | Freq: Every day | ORAL | Status: DC

## 2015-10-05 MED ORDER — THIAMINE HCL 100 MG/ML IJ SOLN: 100.0000 mg | Freq: Every day | INTRAMUSCULAR | Status: DC

## 2015-10-05 MED ORDER — LORAZEPAM 1 MG PO TABS
1.0000 mg | ORAL_TABLET | Freq: Four times a day (QID) | ORAL | Status: DC | PRN
  Filled 2015-10-05: qty 1

## 2015-10-05 MED ORDER — LEVOFLOXACIN IN D5W 500 MG/100ML IV SOLN
INTRAVENOUS | Status: AC
  Administered 2015-10-05: 02:00:00
  Filled 2015-10-05: qty 100

## 2015-10-05 MED ORDER — CLONAZEPAM 0.5 MG PO TABS
0.5000 mg | ORAL_TABLET | Freq: Two times a day (BID) | ORAL | Status: DC
  Administered 2015-10-05 (×2): 0.5 mg via ORAL
  Filled 2015-10-05 (×2): qty 1

## 2015-10-05 MED ORDER — ONDANSETRON HCL 4 MG/2ML IJ SOLN: 4.0000 mg | Freq: Four times a day (QID) | INTRAMUSCULAR | Status: DC | PRN

## 2015-10-05 MED ORDER — THIAMINE HCL 100 MG/ML IJ SOLN
Freq: Once | INTRAVENOUS | Status: AC
  Administered 2015-10-05: 07:00:00 via INTRAVENOUS
  Filled 2015-10-05: qty 1000

## 2015-10-05 MED ORDER — FREE WATER
200.0000 mL | Freq: Three times a day (TID) | Status: DC
  Filled 2015-10-05 (×4): qty 200

## 2015-10-05 MED ORDER — SODIUM CHLORIDE 0.9% FLUSH: 3.0000 mL | Freq: Two times a day (BID) | INTRAVENOUS | Status: DC

## 2015-10-05 MED ORDER — POTASSIUM PHOSPHATES 15 MMOLE/5ML IV SOLN
20.0000 meq | Freq: Once | INTRAVENOUS | Status: AC
  Administered 2015-10-05: 20 meq via INTRAVENOUS
  Filled 2015-10-05: qty 4.55

## 2015-10-05 MED ORDER — MAGNESIUM SULFATE 2 GM/50ML IV SOLN
2.0000 g | Freq: Once | INTRAVENOUS | Status: AC
  Administered 2015-10-05: 2 g via INTRAVENOUS
  Filled 2015-10-05: qty 50

## 2015-10-05 MED ORDER — BOOST / RESOURCE BREEZE PO LIQD
1.0000 | Freq: Three times a day (TID) | ORAL | Status: DC
  Administered 2015-10-05: 1 via ORAL

## 2015-10-05 MED ORDER — ONDANSETRON HCL 4 MG PO TABS: 4.0000 mg | ORAL_TABLET | Freq: Four times a day (QID) | ORAL | Status: DC | PRN

## 2015-10-05 MED ORDER — LEVOFLOXACIN 750 MG PO TABS: 750.0000 mg | ORAL_TABLET | Freq: Every day | ORAL | Status: DC

## 2015-10-05 MED ORDER — LORAZEPAM 2 MG/ML IJ SOLN
INTRAMUSCULAR | Status: AC
  Administered 2015-10-05: 02:00:00
  Filled 2015-10-05: qty 1

## 2015-10-05 MED ORDER — LORAZEPAM 2 MG/ML IJ SOLN
1.0000 mg | Freq: Four times a day (QID) | INTRAMUSCULAR | Status: DC | PRN
  Administered 2015-10-05: 1 mg via INTRAVENOUS
  Filled 2015-10-05: qty 1

## 2015-10-05 NOTE — ED Notes (Signed)
0100 1st dose of Ativan 74m IV push 0150 2nd dose of Ativan 142mIV push meds given during computer down time

## 2015-10-05 NOTE — Progress Notes (Signed)
Pharmacy Antibiotic Note  Kristine Macias is a 30 y.o. female admitted on 10/04/2015 with pneumonia.  Pharmacy has been consulted for Levaquin dosing.  Sputum cx pending CXR: RLL probable pneumonia CrCl est~ 65 mL/min  Plan: Levaquin 750 mg IV q 24 hours ordered.   Height: 5' 6"  (167.6 cm) Weight: 88 lb (39.917 kg) IBW/kg (Calculated) : 59.3  Temp (24hrs), Avg:98.7 F (37.1 C), Min:98.2 F (36.8 C), Max:99.1 F (37.3 C)   Recent Labs Lab 10/04/15 2015  WBC 9.3  CREATININE <0.30*    CrCl cannot be calculated (Patient has no serum creatinine result on file.).    Allergies  Allergen Reactions  . Aspirin Shortness Of Breath  . Effexor [Venlafaxine] Anaphylaxis  . Sulfa Antibiotics Anaphylaxis  . Tetracyclines & Related Anaphylaxis  . Trazodone And Nefazodone Anaphylaxis  . Latex Hives, Itching and Rash  . Paxil [Paroxetine] Hives, Itching and Rash  . Seroquel [Quetiapine Fumarate] Hives, Itching and Rash  . Zoloft [Sertraline Hcl] Hives, Itching and Rash    Antimicrobials this admission:  Dose adjustments this admission:   Microbiology results:   Thank you for allowing pharmacy to be a part of this patient's care.  Hermenegildo Clausen S 10/05/2015 4:11 AM

## 2015-10-05 NOTE — Care Management (Signed)
RNCM consult received for ETOH concerns. I have notified CSW of this request.

## 2015-10-05 NOTE — Clinical Social Work Note (Signed)
Clinical Social Work Assessment  Patient Details  Name: Kristine Macias MRN: [id number] Date of Birth: 01/16/1986  Date of referral:  10/05/15               Reason for consult:  Substance Use/ETOH Abuse                Permission sought to share information with:    Permission granted to share information::     Name::        Agency::     Relationship::     Contact Information:     Housing/Transportation Living arrangements for the past 2 months:  Single Family Home Source of Information:  Spouse Patient Interpreter Needed:  None Criminal Activity/Legal Involvement Pertinent to Current Situation/Hospitalization:  No - Comment as needed Significant Relationships:  Spouse, Parents Lives with:  Minor Children, Spouse Do you feel safe going back to the place where you live?  Yes Need for family participation in patient care:  Yes (Comment)  Care giving concerns:  Patient lives in [place] with [name], her mother Kristine Macias and 2 sons ages 26 and 61.    Social Worker assessment / plan: Holiday representative (CSW) received verbal consult from Chief Strategy Officer for ETOH use. CSW met with patient and [name] was at bedside. CSW introduced self and explained role of CSW department. Patient was laying in the bed and did not participate in assessment. Patient shook her head yes and no and did not open her eyes. Per husband they live in [place] and patient's mother Kristine Macias helps with childcare. Patient and husband did not give much information and reported that they have no needs. CSW left outpatient substance abuse resources for patient. Please reconsult if future social work needs arise. CSW signing off.   Employment status:  Aeronautical engineer:  Medicaid In [place] PT Recommendations:  Not assessed at this time Information / Referral to community resources:  Outpatient Substance Abuse Treatment Options  Patient/Family's Response to care:  Patient did not  participate in assessment.   Patient/Family's Understanding of and Emotional Response to Diagnosis, Current Treatment, and Prognosis: Husband thanked CSW for resources.   Emotional Assessment Appearance:  Appears stated age Attitude/Demeanor/Rapport:  Lethargic Affect (typically observed):    Orientation:  Oriented to Self, Oriented to Place, Oriented to  Time, Oriented to Situation Alcohol / Substance use:  Alcohol Use Psych involvement (Current and /or in the community):  Yes (Comment)  Discharge Needs  Concerns to be addressed:  Discharge Planning Concerns Readmission within the last 30 days:  No Current discharge risk:  Substance Abuse Barriers to Discharge:  No Barriers Identified   Loralyn Freshwater, LCSW 10/05/2015, 11:28 AM

## 2015-10-05 NOTE — Discharge Summary (Addendum)
[facility] [place] NAME: Kristine Macias    MR#:  [id number]  DATE OF BIRTH:  06/15/1986  DATE OF ADMISSION:  10/04/2015 ADMITTING PHYSICIAN: Lance Coon, MD  DATE OF DISCHARGE: 10/05/2015  PRIMARY CARE PHYSICIAN: HAMRICK,MAURA L, MD    ADMISSION DIAGNOSIS:  pain r lung poss collasped  DISCHARGE DIAGNOSIS:  Principal Problem:   CAP (community acquired pneumonia) Active Problems:   Failure to thrive in adult   Alcohol withdrawal ([place])   Alcoholic hepatitis   Thrombocytopenia ([place])   SECONDARY DIAGNOSIS:   Past Medical History  Diagnosis Date  . Depression with anxiety initally at age 40  . Chlamydia 2007    bacterial vaginosis 09/2011  . Irregular heart beat 2010    wt/diet related after evaluation  . Renal disorder   . Pneumothorax, spontaneous, tension   . Alcohol abuse 11/2014    drinking since age 2. chronic, recurrent. at least 2 detox admits before 2015.   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   . Failure to thrive in adult 12/2014.     Malnutrition: n/v, not eating, weight loss, BMI 14.  s/p 01/11/2015 PEG (Dr Dorna Leitz).   . Alcoholic hepatitis 01/2835    hepatic steatosis on 11/2014 ultrasound.   . Seizure ([place]) 06/2015    due to ETOH/benzo withdrawal.   . Thrombocytopenia ([place]) 04/2007  . Colitis 12/2014    colonoscopy for diarrhea and abnml CT 01/06/15: erythmatous TI (path: active ileitis, ? emerging IBD), rectal erythema (path: mucosal prolapse). Random bx of normal colon (path unremarkable)   . Spontaneous pneumothorax 08/2012    right.  chest tube placed.     HOSPITAL COURSE:    This is a 30 year old female with alcohol hepatitis and severe malnutrition who presented with weakness and found to have pneumonia. For further details please H&P.  1. Community-acquired pneumonia: Patient was placed on IV Levaquin. Her symptoms have improved. She is not hypoxic and will be discharged on by mouth  Levaquin.  2. EtOH abuse: Patient was placed on CIWA protocol. Case management was consulted. Patient states she wants to quit drinking however was not willing to try inpatient rehabilitation.  3. Tobacco dependence: Patient was counseled on stopping looking. Patient scheduled for 3 minutes. She will not stop smoking at this time as she states she is trying to quit EtOH.  4.. Alcoholic hepatitis: LFTs are at her baseline.  5. Severe malnutrition: Patient has a PEG tube but is not using this. She is seen her GI physician but they are not recommending removal of this PEG tube at this time. Continue dietary supplementation.  7.. Thrombocytopenia: This is chronic in nature due to EtOH.  8. Electrolyte abnormality: All electrolytes were repleted prior to discharge.   DISCHARGE CONDITIONS AND DIET:   Stable for discharge on a regular diet  CONSULTS OBTAINED:     DRUG ALLERGIES:   Allergies  Allergen Reactions  . Aspirin Shortness Of Breath  . Effexor [Venlafaxine] Anaphylaxis  . Sulfa Antibiotics Anaphylaxis  . Tetracyclines & Related Anaphylaxis  . Trazodone And Nefazodone Anaphylaxis  . Latex Hives, Itching and Rash  . Paxil [Paroxetine] Hives, Itching and Rash  . Seroquel [Quetiapine Fumarate] Hives, Itching and Rash  . Zoloft [Sertraline Hcl] Hives, Itching and Rash    DISCHARGE MEDICATIONS:   Current Discharge Medication List    START taking these medications   Details  levofloxacin (LEVAQUIN) 750 MG tablet  Take 1 tablet (750 mg total) by mouth daily. Qty: 5 tablet, Refills: 0      CONTINUE these medications which have NOT CHANGED   Details  CALCIUM-VITAMIN D PO Take 1 tablet by mouth daily.    clonazePAM (KLONOPIN) 0.5 MG tablet Take 0.5 mg by mouth 2 (two) times daily. Refills: 0    feeding supplement (BOOST / RESOURCE BREEZE) LIQD Take 1 Container by mouth 3 (three) times daily between meals. Refills: 0    folic acid (FOLVITE) 1 MG tablet Take 1 tablet (1 mg  total) by mouth daily. Qty: 30 tablet, Refills: 0    Magnesium Oxide 400 (240 MG) MG TABS Take 400 mg by mouth 2 (two) times daily. Qty: 60 tablet, Refills: 0    Multiple Vitamin (MULTIVITAMIN WITH MINERALS) TABS tablet Take 1 tablet by mouth daily.    omeprazole (PRILOSEC) 20 MG capsule Take 20 mg by mouth daily.    ondansetron (ZOFRAN) 4 MG tablet Take 4 mg by mouth every 6 (six) hours as needed for nausea or vomiting.     thiamine 100 MG tablet Take 1 tablet (100 mg total) by mouth daily. Qty: 30 tablet, Refills: 0    Water For Irrigation, Sterile (FREE WATER) SOLN Place 200 mLs into feeding tube every 8 (eight) hours. Qty: 1000 mL, Refills: 5      STOP taking these medications     LORazepam (ATIVAN) 1 MG tablet      potassium chloride (K-DUR) 10 MEQ tablet               Today   CHIEF COMPLAINT:  Patient is doing fairly well this point. Denies cough or shortness of breath.  VITAL SIGNS:  Blood pressure 101/65, pulse 95, temperature 98.3 F (36.8 C), temperature source Oral, resp. rate 18, height 5' 6"  (1.676 m), weight 40.189 kg (88 lb 9.6 oz), last menstrual period 07/28/2015, SpO2 99 %.   REVIEW OF SYSTEMS:  Review of Systems  Constitutional: Negative for fever, chills and malaise/fatigue.  HENT: Negative for sore throat.   Eyes: Negative for blurred vision.  Respiratory: Negative for cough, hemoptysis, shortness of breath and wheezing.   Cardiovascular: Negative for chest pain, palpitations and leg swelling.  Gastrointestinal: Negative for nausea, vomiting, abdominal pain, diarrhea and blood in stool.  Genitourinary: Negative for dysuria.  Musculoskeletal: Negative for back pain.  Neurological: Positive for weakness. Negative for dizziness, tremors and headaches.  Endo/Heme/Allergies: Does not bruise/bleed easily.     PHYSICAL EXAMINATION:  GENERAL:  30 y.o.-year-old patient lying in the bed with no acute distress.  NECK:  Supple, no jugular venous  distention. No thyroid enlargement, no tenderness.  LUNGS: Normal breath sounds bilaterally, no wheezing, rales,rhonchi  No use of accessory muscles of respiration.  CARDIOVASCULAR: S1, S2 normal. No murmurs, rubs, or gallops.  ABDOMEN: Soft, non-tender, non-distended. Bowel sounds present. No organomegaly or mass.  EXTREMITIES: No pedal edema, cyanosis, or clubbing.  PSYCHIATRIC: The patient is alert and oriented x 3.  SKIN: No obvious rash, lesion, or ulcer.   DATA REVIEW:   CBC  Recent Labs Lab 10/04/15 2015  WBC 9.3  HGB 10.8*  HCT 31.2*  PLT 86*    Chemistries   Recent Labs Lab 10/05/15 0602  NA 132*  K 3.2*  CL 95*  CO2 28  GLUCOSE 81  BUN 10  CREATININE 0.38*  CALCIUM 7.1*  MG 1.5*  AST 252*  ALT 64*  ALKPHOS 314*  BILITOT 4.8*  Cardiac Enzymes No results for input(s): TROPONINI in the last 168 hours.  Microbiology Results  @MICRORSLT48 @  RADIOLOGY:  Dg Chest 2 View  10/04/2015  CLINICAL DATA:  Chest pain at from coughing. History of pneumothorax. EXAM: CHEST  2 VIEW COMPARISON:  08/08/2015. FINDINGS: Normal sized heart. Interval minimal ill-defined opacity at the medial, anterior aspect of the right lower lobe. Otherwise, clear lungs. Minimal central peribronchial thickening with improvement. Unremarkable bones. No pneumothorax. IMPRESSION: 1. Interval minimal probable pneumonia in the right lower lobe. 2. Minimal bronchitic changes with improvement. Electronically Signed   By: Claudie Revering M.D.   On: 10/04/2015 20:56      Management plans discussed with the patient and she is in agreement. Stable for discharge   Patient should follow up with PCP in 1 week  CODE STATUS:     Code Status Orders        Start     Ordered   10/05/15 0335  Full code   Continuous     10/05/15 0334    Code Status History    Date Active Date Inactive Code Status Order ID Comments User Context   08/08/2015  5:52 PM 08/10/2015  8:21 PM Full Code [id number]  Elmarie Shiley, MD Inpatient   07/09/2015  6:11 PM 07/14/2015  5:56 PM Full Code [id number]  Theodis Blaze, MD Inpatient   07/01/2015 12:32 AM 07/02/2015  5:52 PM Full Code [id number]  Lytle Butte, MD ED   06/21/2015 12:25 PM 06/22/2015  4:08 PM Full Code [id number]  Epifanio Lesches, MD ED   05/20/2015  7:56 PM 05/23/2015  3:55 PM Full Code [id number]  Fritzi Mandes, MD Inpatient   04/16/2015  9:21 PM 04/18/2015  4:55 PM Full Code [id number]  Idelle Crouch, MD Inpatient   03/01/2015  8:52 PM 03/03/2015  4:05 PM Full Code [id number]  Lytle Butte, MD ED   01/04/2015  7:23 PM 01/13/2015  7:12 PM Full Code [id number]  Fritzi Mandes, MD ED   11/24/2014  6:21 PM 11/26/2014  1:29 PM Full Code [id number]  Serita Grit, MD ED      TOTAL TIME TAKING CARE OF THIS PATIENT: 35 minutes.    Note: This dictation was prepared with Dragon dictation along with smaller phrase technology. Any transcriptional errors that result from this process are unintentional.  Larena Ohnemus M.D on 10/05/2015 at 11:40 AM  Between 7am to 6pm - Pager - [phone] After 6pm go to [email or web address] - password [email or web address] [facility]  [facility]  Office  [phone]  CC: Primary care physician; Leonides Sake, MD

## 2015-10-05 NOTE — H&P (Signed)
[facility] at [place] NAME: Kristine Macias    MR#:  [id number]  DATE OF BIRTH:  1986/02/12  DATE OF ADMISSION:  10/04/2015  PRIMARY CARE PHYSICIAN: Leonides Sake, MD   REQUESTING/REFERRING PHYSICIAN: Owens Shark, M.D.  CHIEF COMPLAINT:   Chief Complaint  Patient presents with  . Cough  . Chest Pain    HISTORY OF PRESENT ILLNESS:  Kristine Macias  is a 30 y.o. female who presents with cough and malaise. Patient states that she has been feeling bad for the past couple of weeks. She has had an intermittent cough during that time, as well as intermittent fevers. She states that she has been coughing up some brownish sputum. She went to see her outpatient physician, who listened to her lungs and felt that she had pneumonia. She sent her to the ED for evaluation. Here in the ED she has a workup which is largely stable, but didn't include a chest x-ray suggestive of right lower lobe pneumonia. Patient is also tachycardic, sinus rhythm. Patient has alcoholic liver disease, and states that her last drink was a little over 24 hours ago. On chemistry she has transaminitis, which is actually improved from prior checks. She has a chronically elevated bilirubin, and chronic thrombocytopenia. She also has a PEG tube which she states was placed due to her malabsorption. Hospitalists were called for admission for community acquired pneumonia.  PAST MEDICAL HISTORY:   Past Medical History  Diagnosis Date  . Depression with anxiety initally at age 59  . Chlamydia 2007    bacterial vaginosis 09/2011  . Irregular heart beat 2010    wt/diet related after evaluation  . Renal disorder   . Pneumothorax, spontaneous, tension   . Alcohol abuse 11/2014    drinking since age 21. chronic, recurrent. at least 2 detox admits before 2015.   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   . Failure to thrive in adult 12/2014.     Malnutrition: n/v, not eating, weight  loss, BMI 14.  s/p 01/11/2015 PEG (Dr Dorna Leitz).   . Alcoholic hepatitis 11/4816    hepatic steatosis on 11/2014 ultrasound.   . Seizure ([place]) 06/2015    due to ETOH/benzo withdrawal.   . Thrombocytopenia ([place]) 04/2007  . Colitis 12/2014    colonoscopy for diarrhea and abnml CT 01/06/15: erythmatous TI (path: active ileitis, ? emerging IBD), rectal erythema (path: mucosal prolapse). Random bx of normal colon (path unremarkable)   . Spontaneous pneumothorax 08/2012    right.  chest tube placed.     PAST SURGICAL HISTORY:   Past Surgical History  Procedure Laterality Date  . Cesarean section  04/2007, 07/2009     x 2.  G3, Para 2012 as of 07/2009.   Marland Kitchen Chest tube insertion    . Esophagogastroduodenoscopy N/A 01/06/2015    ;[facility], Dr Rayann Heman. For wt loss, N/V: Normal study, duodenal biopsy/pathology: chronic active duodenitis.   . Colonoscopy with propofol N/A 01/06/2015    [facility], Dr Rayann Heman. colonoscopy for diarrhea and abnml CT 01/06/15: erythmatous TI (path: active ileitis, ? emerging IBD), rectal erythema (path: mucosal prolapse). Random bx of normal colon (path unremarkable)   . Peg placement N/A 01/11/2015    [facility], Dr Rayann Heman.  for N/V/wt loss/severe malnutrition.   . Esophagogastroduodenoscopy N/A 01/11/2015    Rein-normal with PEG placement    SOCIAL HISTORY:   Social History  Substance Use Topics  . Smoking status: Current Every  Day Smoker -- 2.00 packs/day    Types: Cigarettes  . Smokeless tobacco: Never Used  . Alcohol Use: 21.6 oz/week    36 Cans of beer per week     Comment: 6 beers/day; trying to quit, participating in [place]:   Family History  Problem Relation Age of Onset  . Anesthesia problems Neg Hx   . Thyroid disease Mother   . Cancer Father   . Stroke Other   . Crohn's disease Brother     DRUG ALLERGIES:   Allergies  Allergen Reactions  . Aspirin Shortness Of Breath  . Effexor [Venlafaxine] Anaphylaxis  . Sulfa Antibiotics Anaphylaxis  . Tetracyclines &  Related Anaphylaxis  . Trazodone And Nefazodone Anaphylaxis  . Latex Hives, Itching and Rash  . Paxil [Paroxetine] Hives, Itching and Rash  . Seroquel [Quetiapine Fumarate] Hives, Itching and Rash  . Zoloft [Sertraline Hcl] Hives, Itching and Rash    MEDICATIONS AT HOME:   Prior to Admission medications   Medication Sig Start Date End Date Taking? Authorizing Provider  CALCIUM-VITAMIN D PO Take 1 tablet by mouth daily.    Historical Provider, MD  clonazePAM (KLONOPIN) 0.5 MG tablet Take 0.5 mg by mouth 2 (two) times daily. 08/01/15   Historical Provider, MD  feeding supplement (BOOST / RESOURCE BREEZE) LIQD Take 1 Container by mouth 3 (three) times daily between meals. 08/10/15   Geradine Girt, DO  folic acid (FOLVITE) 1 MG tablet Take 1 tablet (1 mg total) by mouth daily. 07/14/15   Theodis Blaze, MD  LORazepam (ATIVAN) 1 MG tablet Take 1 tablet (1 mg total) by mouth every 8 (eight) hours as needed for anxiety. 08/10/15   Geradine Girt, DO  Magnesium Oxide 400 (240 MG) MG TABS Take 400 mg by mouth 2 (two) times daily. 07/14/15   Theodis Blaze, MD  Multiple Vitamin (MULTIVITAMIN WITH MINERALS) TABS tablet Take 1 tablet by mouth daily.    Historical Provider, MD  Nutritional Supplements (FEEDING SUPPLEMENT, JEVITY 1.5 CAL/FIBER,) LIQD Place 237 mLs into feeding tube 4 (four) times daily. 07/02/15   Aldean Jewett, MD  omeprazole (PRILOSEC) 20 MG capsule Take 20 mg by mouth daily.    Historical Provider, MD  ondansetron (ZOFRAN) 4 MG tablet Take 4 mg by mouth every 6 (six) hours as needed for nausea or vomiting.     Historical Provider, MD  potassium chloride (K-DUR) 10 MEQ tablet Take 1 tablet (10 mEq total) by mouth 2 (two) times daily. 04/18/15   Loletha Grayer, MD  thiamine 100 MG tablet Take 1 tablet (100 mg total) by mouth daily. 07/14/15   Theodis Blaze, MD  Water For Irrigation, Sterile (FREE WATER) SOLN Place 200 mLs into feeding tube every 8 (eight) hours. Patient not taking:  Reported on 06/21/2015 05/23/15   Gladstone Lighter, MD    REVIEW OF SYSTEMS:  Review of Systems  Constitutional: Positive for fever. Negative for chills, weight loss and malaise/fatigue.  HENT: Negative for ear pain, hearing loss and tinnitus.   Eyes: Negative for blurred vision, double vision, pain and redness.  Respiratory: Positive for cough, sputum production and shortness of breath. Negative for hemoptysis.   Cardiovascular: Negative for chest pain, palpitations, orthopnea and leg swelling.  Gastrointestinal: Negative for nausea, vomiting, abdominal pain, diarrhea and constipation.  Genitourinary: Negative for dysuria, frequency and hematuria.  Musculoskeletal: Negative for back pain, joint pain and neck pain.  Skin:  No acne, rash, or lesions  Neurological: Negative for dizziness, tremors, focal weakness and weakness.  Endo/Heme/Allergies: Negative for polydipsia. Does not bruise/bleed easily.  Psychiatric/Behavioral: Negative for depression. The patient is not nervous/anxious and does not have insomnia.      VITAL SIGNS:   Filed Vitals:   10/05/15 0030 10/05/15 0100 10/05/15 0130 10/05/15 0200  BP: 111/71 123/99 121/89 108/79  Pulse: 106 112 118 112  Temp:      TempSrc:      Resp:   18 22  Height:      Weight:      SpO2: 98% 98% 97% 96%   Wt Readings from Last 3 Encounters:  10/04/15 39.917 kg (88 lb)  08/08/15 42.1 kg (92 lb 13 oz)  07/12/15 40.1 kg (88 lb 6.5 oz)    PHYSICAL EXAMINATION:  Physical Exam  Vitals reviewed. Constitutional: She is oriented to person, place, and time. She appears well-developed. No distress.  Cachectic  HENT:  Head: Normocephalic and atraumatic.  Mouth/Throat: Oropharynx is clear and moist.  Eyes: Conjunctivae and EOM are normal. Pupils are equal, round, and reactive to light. No scleral icterus.  Neck: Normal range of motion. Neck supple. No JVD present. No thyromegaly present.  Cardiovascular: Regular rhythm and intact  distal pulses.  Exam reveals no gallop and no friction rub.   No murmur heard. Tachycardic  Respiratory: Effort normal. No respiratory distress. She has no wheezes. She has no rales.  Right lower lobe soft rhonchi  GI: Soft. Bowel sounds are normal. She exhibits no distension. There is no tenderness.  Musculoskeletal: Normal range of motion. She exhibits no edema.  No arthritis, no gout  Lymphadenopathy:    She has no cervical adenopathy.  Neurological: She is alert and oriented to person, place, and time. No cranial nerve deficit.  No dysarthria, no aphasia  Skin: Skin is warm and dry. No rash noted. No erythema.  Psychiatric: Her behavior is normal. Judgment and thought content normal.  Depressed mood/affect    LABORATORY PANEL:   CBC  Recent Labs Lab 10/04/15 2015  WBC 9.3  HGB 10.8*  HCT 31.2*  PLT 86*   ------------------------------------------------------------------------------------------------------------------  Chemistries   Recent Labs Lab 10/04/15 2015  NA 132*  K 3.0*  CL 92*  CO2 32  GLUCOSE 122*  BUN 8  CREATININE <0.30*  CALCIUM 7.9*  AST 257*  ALT 73*  ALKPHOS 369*  BILITOT 4.2*   ------------------------------------------------------------------------------------------------------------------  Cardiac Enzymes No results for input(s): TROPONINI in the last 168 hours. ------------------------------------------------------------------------------------------------------------------  RADIOLOGY:  Dg Chest 2 View  10/04/2015  CLINICAL DATA:  Chest pain at from coughing. History of pneumothorax. EXAM: CHEST  2 VIEW COMPARISON:  08/08/2015. FINDINGS: Normal sized heart. Interval minimal ill-defined opacity at the medial, anterior aspect of the right lower lobe. Otherwise, clear lungs. Minimal central peribronchial thickening with improvement. Unremarkable bones. No pneumothorax. IMPRESSION: 1. Interval minimal probable pneumonia in the right lower  lobe. 2. Minimal bronchitic changes with improvement. Electronically Signed   By: Claudie Revering M.D.   On: 10/04/2015 20:56    EKG:   Orders placed or performed during the hospital encounter of 08/08/15  . ED EKG within 10 minutes  . ED EKG within 10 minutes  . EKG    IMPRESSION AND PLAN:  Principal Problem:   CAP (community acquired pneumonia) - IV Levaquin, sputum culture ordered. Active Problems:   Alcohol withdrawal ([place]) - CIWA protocol   Failure to thrive in adult - dietitian consult  Alcoholic hepatitis - seems to be chronic, transaminase is slightly better today. Might consider GI consult for any further recommendations. Unclear from patient's reports why she has a PEG tube.   Thrombocytopenia ([facility]) - chronic, avoid anticoagulation if possible.  All the records are reviewed and case discussed with ED provider. Management plans discussed with the patient and/or family.  DVT PROPHYLAXIS: Mechanical only  GI PROPHYLAXIS: PPI  ADMISSION STATUS: Inpatient  CODE STATUS: Full Code Status History    Date Active Date Inactive Code Status Order ID Comments User Context   08/08/2015  5:52 PM 08/10/2015  8:21 PM Full Code [id number]  Elmarie Shiley, MD Inpatient   07/09/2015  6:11 PM 07/14/2015  5:56 PM Full Code [id number]  Theodis Blaze, MD Inpatient   07/01/2015 12:32 AM 07/02/2015  5:52 PM Full Code [id number]  Lytle Butte, MD ED   06/21/2015 12:25 PM 06/22/2015  4:08 PM Full Code [id number]  Epifanio Lesches, MD ED   05/20/2015  7:56 PM 05/23/2015  3:55 PM Full Code [id number]  Fritzi Mandes, MD Inpatient   04/16/2015  9:21 PM 04/18/2015  4:55 PM Full Code [id number]  Idelle Crouch, MD Inpatient   03/01/2015  8:52 PM 03/03/2015  4:05 PM Full Code [id number]  Lytle Butte, MD ED   01/04/2015  7:23 PM 01/13/2015  7:12 PM Full Code [id number]  Fritzi Mandes, MD ED   11/24/2014  6:21 PM 11/26/2014  1:29 PM Full Code [id number]  Serita Grit, MD ED      TOTAL TIME TAKING CARE OF THIS PATIENT: 45  minutes.    Dhiya Smits [place] 10/05/2015, 2:34 AM  [facility]  Office  [phone]  CC: Primary care physician; Leonides Sake, MD

## 2015-10-05 NOTE — ED Notes (Signed)
Down time documentation - see paper documentation

## 2015-10-07 NOTE — ED Provider Notes (Signed)
[facility] Emergency Department Provider Note  ____________________________________________  Time seen: 12:00 AM  I have reviewed the triage vital signs and the nursing notes.   HISTORY  Chief Complaint Cough and Chest Pain      HPI Kristine Macias is a 30 y.o. female presents with referrals to the emergency department via primary care provider for "abnormal liver labs. In addition patient admits to chest discomfort and cough over the past 3 days. Patient also admits to feelings of dehydration. Patient admits to heavy alcohol consumption daily with "elevated liver enzymes. Patient states last alcohol ingestion was yesterday and that she is feeling tremulous at this time. Patient denies any fever     Past Medical History  Diagnosis Date  . Depression with anxiety initally at age 66  . Chlamydia 2007    bacterial vaginosis 09/2011  . Irregular heart beat 2010    wt/diet related after evaluation  . Renal disorder   . Pneumothorax, spontaneous, tension   . Alcohol abuse 11/2014    drinking since age 1. chronic, recurrent. at least 2 detox admits before 2015.   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   . Failure to thrive in adult 12/2014.     Malnutrition: n/v, not eating, weight loss, BMI 14.  s/p 01/11/2015 PEG (Dr Dorna Leitz).   . Alcoholic hepatitis 01/6439    hepatic steatosis on 11/2014 ultrasound.   . Seizure ([place]) 06/2015    due to ETOH/benzo withdrawal.   . Thrombocytopenia ([place]) 04/2007  . Colitis 12/2014    colonoscopy for diarrhea and abnml CT 01/06/15: erythmatous TI (path: active ileitis, ? emerging IBD), rectal erythema (path: mucosal prolapse). Random bx of normal colon (path unremarkable)   . Spontaneous pneumothorax 08/2012    right.  chest tube placed.     Patient Active Problem List   Diagnosis Date Noted  . CAP (community acquired pneumonia) 10/05/2015  . Hepatitis 08/08/2015  . Nausea & vomiting 08/08/2015  . Alcoholic  hepatitis without ascites   . Thrombocytopenia ([place]) 07/11/2015  . Hyponatremia 07/11/2015  . Alcoholic hepatitis with ascites   . Alcoholic hepatitis 34/74/2595  . Alcohol intoxication ([place]) 07/09/2015  . Withdrawal seizures ([place]) 07/01/2015  . 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 Drug withdrawal seizure ([place]) 06/21/2015  . Chest pain 05/20/2015  . GAD (generalized anxiety disorder) 04/17/2015  . Panic disorder 04/17/2015  . Alcohol withdrawal ([place]) 04/16/2015  . Abdominal pain, generalized 04/16/2015  . Vomiting and diarrhea   . Failure to thrive in adult   . Intractable nausea and vomiting 03/01/2015  . Hypokalemia 03/01/2015  . Protein-calorie malnutrition, severe ([place]) 01/05/2015  . Alcohol abuse   . Generalized anxiety disorder 11/26/2014    Class: Chronic  . Alcohol use disorder, severe, dependence ([place]) 11/25/2014  . Adnexal mass 04/10/2012    Past Surgical History  Procedure Laterality Date  . Cesarean section  04/2007, 07/2009     x 2.  G3, Para 2012 as of 07/2009.   Marland Kitchen Chest tube insertion    . Esophagogastroduodenoscopy N/A 01/06/2015    ;[facility], Dr Rayann Heman. For wt loss, N/V: Normal study, duodenal biopsy/pathology: chronic active duodenitis.   . Colonoscopy with propofol N/A 01/06/2015    [facility], Dr Rayann Heman. colonoscopy for diarrhea and abnml CT 01/06/15: erythmatous TI (path: active ileitis, ? emerging IBD), rectal erythema (path: mucosal prolapse). Random bx of normal colon (path unremarkable)   . Peg placement N/A 01/11/2015    [facility],  Dr Rayann Heman.  for N/V/wt loss/severe malnutrition.   . Esophagogastroduodenoscopy N/A 01/11/2015    Rein-normal with PEG placement    Current Outpatient Rx  Name  Route  Sig  Dispense  Refill  . CALCIUM-VITAMIN D PO   Oral   Take 1 tablet by mouth daily.         . clonazePAM (KLONOPIN) 0.5 MG tablet   Oral   Take 0.5 mg by mouth 2 (two) times daily.      0   . feeding supplement (BOOST / RESOURCE BREEZE) LIQD   Oral   Take 1 Container by  mouth 3 (three) times daily between meals.      0   . folic acid (FOLVITE) 1 MG tablet   Oral   Take 1 tablet (1 mg total) by mouth daily.   30 tablet   0   . levofloxacin (LEVAQUIN) 750 MG tablet   Oral   Take 1 tablet (750 mg total) by mouth daily.   5 tablet   0   . Magnesium Oxide 400 (240 MG) MG TABS   Oral   Take 400 mg by mouth 2 (two) times daily.   60 tablet   0   . Multiple Vitamin (MULTIVITAMIN WITH MINERALS) TABS tablet   Oral   Take 1 tablet by mouth daily.         Marland Kitchen omeprazole (PRILOSEC) 20 MG capsule   Oral   Take 20 mg by mouth daily.         . ondansetron (ZOFRAN) 4 MG tablet   Oral   Take 4 mg by mouth every 6 (six) hours as needed for nausea or vomiting.          . thiamine 100 MG tablet   Oral   Take 1 tablet (100 mg total) by mouth daily.   30 tablet   0   . Water For Irrigation, Sterile (FREE WATER) SOLN   Per Tube   Place 200 mLs into feeding tube every 8 (eight) hours. Patient not taking: Reported on 06/21/2015   1000 mL   5     Allergies Aspirin; Effexor; Sulfa antibiotics; Tetracyclines & related; Trazodone and nefazodone; Latex; Paxil; Seroquel; and Zoloft  Family History  Problem Relation Age of Onset  . Anesthesia problems Neg Hx   . Thyroid disease Mother   . Cancer Father   . Stroke Other   . Crohn's disease Brother     Social History Social History  Substance Use Topics  . Smoking status: Current Every Day Smoker -- 2.00 packs/day    Types: Cigarettes  . Smokeless tobacco: Never Used  . Alcohol Use: 21.6 oz/week    36 Cans of beer per week     Comment: 6 beers/day; trying to quit, participating in [place]    Review of Systems  Constitutional: Negative for fever. Eyes: Negative for visual changes. ENT: Negative for sore throat. Cardiovascular: Positive for chest pain. Respiratory: Negative for shortness of breath. Positive for cough Gastrointestinal: Negative for abdominal pain, vomiting and  diarrhea. Genitourinary: Negative for dysuria. Musculoskeletal: Negative for back pain. Skin: Negative for rash. Neurological: Negative for headaches, focal weakness or numbness.   10-point ROS otherwise negative.  ____________________________________________   PHYSICAL EXAM:  VITAL SIGNS: ED Triage Vitals  Enc Vitals Group     BP 10/04/15 2003 113/83 mmHg     Pulse Rate 10/04/15 2003 96     Resp 10/04/15 2359 16     Temp  10/04/15 2003 98.2 F (36.8 C)     Temp Source 10/04/15 2003 Oral     SpO2 10/04/15 2003 95 %     Weight 10/04/15 2003 88 lb (39.917 kg)     Height 10/04/15 2003 5' 6"  (1.676 m)     Head Cir --      Peak Flow --      Pain Score 10/04/15 2003 8     Pain Loc --      Pain Edu? --      Excl. in [place]? --      Constitutional: Alert and oriented. Well appearing and in no distress. Eyes: Conjunctivae are normal. PERRL. Normal extraocular movements. ENT   Head: Normocephalic and atraumatic.   Nose: No congestion/rhinnorhea.   Mouth/Throat: Mucous membranes are moist.   Neck: No stridor. Hematological/Lymphatic/Immunilogical: No cervical lymphadenopathy. Cardiovascular: Normal rate, regular rhythm. Normal and symmetric distal pulses are present in all extremities. No murmurs, rubs, or gallops. Respiratory: Normal respiratory effort without tachypnea nor retractions.Right lower lobe rhonchi Gastrointestinal: Soft and nontender. No distention. There is no CVA tenderness. Genitourinary: deferred Musculoskeletal: Nontender with normal range of motion in all extremities. No joint effusions.  No lower extremity tenderness nor edema. Neurologic:  Normal speech and language. No gross focal neurologic deficits are appreciated. Speech is normal.  Skin:  Skin is warm, dry and intact. No rash noted. Psychiatric: Mood and affect are normal. Speech and behavior are normal. Patient exhibits appropriate insight and  judgment.  ____________________________________________    LABS (pertinent positives/negatives)  Labs Reviewed  CBC - Abnormal; Notable for the following:    RBC 3.15 (*)    Hemoglobin 10.8 (*)    HCT 31.2 (*)    MCH 34.1 (*)    RDW 19.6 (*)    Platelets 86 (*)    All other components within normal limits  COMPREHENSIVE METABOLIC PANEL - Abnormal; Notable for the following:    Sodium 132 (*)    Potassium 3.0 (*)    Chloride 92 (*)    Glucose, Bld 122 (*)    Creatinine, Ser <0.30 (*)    Calcium 7.9 (*)    Albumin 2.5 (*)    AST 257 (*)    ALT 73 (*)    Alkaline Phosphatase 369 (*)    Total Bilirubin 4.2 (*)    All other components within normal limits  BRAIN NATRIURETIC PEPTIDE - Abnormal; Notable for the following:    B Natriuretic Peptide 128.0 (*)    All other components within normal limits  MAGNESIUM - Abnormal; Notable for the following:    Magnesium 1.5 (*)    All other components within normal limits  PHOSPHORUS - Abnormal; Notable for the following:    Phosphorus 2.4 (*)    All other components within normal limits  COMPREHENSIVE METABOLIC PANEL - Abnormal; Notable for the following:    Sodium 132 (*)    Potassium 3.2 (*)    Chloride 95 (*)    Creatinine, Ser 0.38 (*)    Calcium 7.1 (*)    Total Protein 6.0 (*)    Albumin 2.2 (*)    AST 252 (*)    ALT 64 (*)    Alkaline Phosphatase 314 (*)    Total Bilirubin 4.8 (*)    All other components within normal limits  MRSA PCR SCREENING  CULTURE, EXPECTORATED SPUTUM-ASSESSMENT       RADIOLOGY     DG Chest 2 View (Final result) Result time: 10/04/15 20:56:08  Final result by Rad Results In Interface (10/04/15 20:56:08)   Narrative:   CLINICAL DATA: Chest pain at from coughing. History of pneumothorax.  EXAM: CHEST 2 VIEW  COMPARISON: 08/08/2015.  FINDINGS: Normal sized heart. Interval minimal ill-defined opacity at the medial, anterior aspect of the right lower lobe. Otherwise,  clear lungs. Minimal central peribronchial thickening with improvement. Unremarkable bones. No pneumothorax.  IMPRESSION: 1. Interval minimal probable pneumonia in the right lower lobe. 2. Minimal bronchitic changes with improvement.   Electronically Signed By: Claudie Revering M.D. On: 10/04/2015 20:56     ____________________________________________   Critical Care performed: CRITICAL CARE Performed by: Gregor Hams   Total critical care time: 60 minutes  Critical care time was exclusive of separately billable procedures and treating other patients.  Critical care was necessary to treat or prevent imminent or life-threatening deterioration.  Critical care was time spent personally by me on the following activities: development of treatment plan with patient and/or surrogate as well as nursing, discussions with consultants, evaluation of patient's response to treatment, examination of patient, obtaining history from patient or surrogate, ordering and performing treatments and interventions, ordering and review of laboratory studies, ordering and review of radiographic studies, pulse oximetry and re-evaluation of patient's condition.   ____________________________________________   INITIAL IMPRESSION / ASSESSMENT AND PLAN / ED COURSE  Pertinent labs & imaging results that were available during my care of the patient were reviewed by me and considered in my medical decision making (see chart for details).  Patient with symptoms and vital signs consistent with acute alcohol withdrawal as such patient received 2 mg IV Ativan in the emergency part. Regarding findings of pneumonia on clinical exam and x-ray patient received Levaquin. Patient discussed with Dr. Jannifer Franklin for hospital admission  ____________________________________________   FINAL CLINICAL IMPRESSION(S) / ED DIAGNOSES  Final diagnoses:  CAP (community acquired pneumonia)  Alcohol withdrawal, with  unspecified complication [facility])      Gregor Hams, MD 10/07/15 [id number]

## 2015-10-14 ENCOUNTER — Inpatient Hospital Stay
Admission: EM | Admit: 2015-10-14 | Discharge: 2015-10-22 | DRG: 432 | Disposition: A | Discharge status: Home Health | Payer: Medicaid Other | Attending: Internal Medicine | Admitting: Internal Medicine

## 2015-10-14 ENCOUNTER — Telehealth: Payer: Self-pay | Admitting: Gastroenterology

## 2015-10-14 ENCOUNTER — Encounter: Payer: Self-pay | Admitting: Emergency Medicine

## 2015-10-14 ENCOUNTER — Ambulatory Visit
Admission: RE | Admit: 2015-10-14 | Discharge: 2015-10-14 | Disposition: A | Discharge status: Home / Self Care | Payer: Medicaid Other | Source: Ambulatory Visit | Attending: Gastroenterology | Admitting: Gastroenterology

## 2015-10-14 ENCOUNTER — Other Ambulatory Visit: Payer: Self-pay | Admitting: Gastroenterology

## 2015-10-14 DIAGNOSIS — F329 Major depressive disorder, single episode, unspecified: Secondary | ICD-10-CM | POA: Diagnosis present

## 2015-10-14 DIAGNOSIS — Z823 Family history of stroke: Secondary | ICD-10-CM

## 2015-10-14 DIAGNOSIS — F1994 Other psychoactive substance use, unspecified with psychoactive substance-induced mood disorder: Secondary | ICD-10-CM

## 2015-10-14 DIAGNOSIS — F410 Panic disorder [episodic paroxysmal anxiety] without agoraphobia: Secondary | ICD-10-CM | POA: Diagnosis present

## 2015-10-14 DIAGNOSIS — K7011 Alcoholic hepatitis with ascites: Secondary | ICD-10-CM | POA: Diagnosis present

## 2015-10-14 DIAGNOSIS — R109 Unspecified abdominal pain: Secondary | ICD-10-CM | POA: Diagnosis present

## 2015-10-14 DIAGNOSIS — R509 Fever, unspecified: Secondary | ICD-10-CM

## 2015-10-14 DIAGNOSIS — Z886 Allergy status to analgesic agent status: Secondary | ICD-10-CM | POA: Diagnosis not present

## 2015-10-14 DIAGNOSIS — Z931 Gastrostomy status: Secondary | ICD-10-CM | POA: Diagnosis not present

## 2015-10-14 DIAGNOSIS — R935 Abnormal findings on diagnostic imaging of other abdominal regions, including retroperitoneum: Secondary | ICD-10-CM | POA: Insufficient documentation

## 2015-10-14 DIAGNOSIS — J189 Pneumonia, unspecified organism: Secondary | ICD-10-CM | POA: Diagnosis present

## 2015-10-14 DIAGNOSIS — F10239 Alcohol dependence with withdrawal, unspecified: Secondary | ICD-10-CM | POA: Diagnosis present

## 2015-10-14 DIAGNOSIS — R569 Unspecified convulsions: Secondary | ICD-10-CM | POA: Diagnosis present

## 2015-10-14 DIAGNOSIS — K766 Portal hypertension: Secondary | ICD-10-CM | POA: Diagnosis present

## 2015-10-14 DIAGNOSIS — K8050 Calculus of bile duct without cholangitis or cholecystitis without obstruction: Secondary | ICD-10-CM

## 2015-10-14 DIAGNOSIS — Z91199 Patient's noncompliance with other medical treatment and regimen due to unspecified reason: Secondary | ICD-10-CM

## 2015-10-14 DIAGNOSIS — Z79899 Other long term (current) drug therapy: Secondary | ICD-10-CM | POA: Diagnosis not present

## 2015-10-14 DIAGNOSIS — K828 Other specified diseases of gallbladder: Secondary | ICD-10-CM

## 2015-10-14 DIAGNOSIS — Z882 Allergy status to sulfonamides status: Secondary | ICD-10-CM

## 2015-10-14 DIAGNOSIS — R188 Other ascites: Secondary | ICD-10-CM

## 2015-10-14 DIAGNOSIS — Z681 Body mass index (BMI) 19 or less, adult: Secondary | ICD-10-CM

## 2015-10-14 DIAGNOSIS — Z9119 Patient's noncompliance with other medical treatment and regimen: Secondary | ICD-10-CM | POA: Diagnosis not present

## 2015-10-14 DIAGNOSIS — Z809 Family history of malignant neoplasm, unspecified: Secondary | ICD-10-CM | POA: Diagnosis not present

## 2015-10-14 DIAGNOSIS — R1084 Generalized abdominal pain: Secondary | ICD-10-CM | POA: Diagnosis present

## 2015-10-14 DIAGNOSIS — F1024 Alcohol dependence with alcohol-induced mood disorder: Secondary | ICD-10-CM | POA: Diagnosis present

## 2015-10-14 DIAGNOSIS — Z888 Allergy status to other drugs, medicaments and biological substances status: Secondary | ICD-10-CM

## 2015-10-14 DIAGNOSIS — R748 Abnormal levels of other serum enzymes: Secondary | ICD-10-CM

## 2015-10-14 DIAGNOSIS — I81 Portal vein thrombosis: Secondary | ICD-10-CM

## 2015-10-14 DIAGNOSIS — I959 Hypotension, unspecified: Secondary | ICD-10-CM | POA: Diagnosis not present

## 2015-10-14 DIAGNOSIS — F418 Other specified anxiety disorders: Secondary | ICD-10-CM | POA: Diagnosis present

## 2015-10-14 DIAGNOSIS — E43 Unspecified severe protein-calorie malnutrition: Secondary | ICD-10-CM | POA: Diagnosis present

## 2015-10-14 DIAGNOSIS — Z91040 Latex allergy status: Secondary | ICD-10-CM | POA: Diagnosis not present

## 2015-10-14 DIAGNOSIS — F1010 Alcohol abuse, uncomplicated: Secondary | ICD-10-CM

## 2015-10-14 DIAGNOSIS — F17210 Nicotine dependence, cigarettes, uncomplicated: Secondary | ICD-10-CM | POA: Diagnosis present

## 2015-10-14 DIAGNOSIS — F10230 Alcohol dependence with withdrawal, uncomplicated: Secondary | ICD-10-CM

## 2015-10-14 DIAGNOSIS — K759 Inflammatory liver disease, unspecified: Secondary | ICD-10-CM

## 2015-10-14 LAB — CBC WITH DIFFERENTIAL/PLATELET
BASOS PCT: 2 %
Basophils Absolute: 0.2 10*3/uL — ABNORMAL HIGH (ref 0–0.1)
Eosinophils Absolute: 0 10*3/uL (ref 0–0.7)
Eosinophils Relative: 0 %
HEMATOCRIT: 31.5 % — AB (ref 35.0–47.0)
HEMOGLOBIN: 11 g/dL — AB (ref 12.0–16.0)
LYMPHS ABS: 2 10*3/uL (ref 1.0–3.6)
Lymphocytes Relative: 21 %
MCH: 35.9 pg — AB (ref 26.0–34.0)
MCHC: 34.9 g/dL (ref 32.0–36.0)
MCV: 103 fL — AB (ref 80.0–100.0)
MONO ABS: 0.4 10*3/uL (ref 0.2–0.9)
MONOS PCT: 5 %
NEUTROS ABS: 6.8 10*3/uL — AB (ref 1.4–6.5)
NEUTROS PCT: 72 %
Platelets: 143 10*3/uL — ABNORMAL LOW (ref 150–440)
RBC: 3.06 MIL/uL — ABNORMAL LOW (ref 3.80–5.20)
RDW: 19 % — AB (ref 11.5–14.5)
WBC: 9.4 10*3/uL (ref 3.6–11.0)

## 2015-10-14 LAB — BASIC METABOLIC PANEL
Anion gap: 7 (ref 5–15)
BUN: 12 mg/dL (ref 6–20)
CALCIUM: 7.7 mg/dL — AB (ref 8.9–10.3)
CHLORIDE: 90 mmol/L — AB (ref 101–111)
CO2: 35 mmol/L — ABNORMAL HIGH (ref 22–32)
CREATININE: 0.33 mg/dL — AB (ref 0.44–1.00)
GFR calc Af Amer: >60 mL/min (ref >60)
GFR calc non Af Amer: >60 mL/min (ref >60)
Glucose, Bld: 96 mg/dL (ref 65–99)
Potassium: 3 mmol/L — ABNORMAL LOW (ref 3.5–5.1)
SODIUM: 132 mmol/L — AB (ref 135–145)

## 2015-10-14 LAB — HEPATIC FUNCTION PANEL
ALT: 76 U/L — AB (ref 14–54)
AST: 407 U/L — AB (ref 15–41)
Albumin: 2.1 g/dL — ABNORMAL LOW (ref 3.5–5.0)
Alkaline Phosphatase: 347 U/L — ABNORMAL HIGH (ref 38–126)
BILIRUBIN DIRECT: 1.3 mg/dL — AB (ref 0.1–0.5)
Indirect Bilirubin: 1.4 mg/dL — ABNORMAL HIGH (ref 0.3–0.9)
TOTAL PROTEIN: 6.1 g/dL — AB (ref 6.5–8.1)
Total Bilirubin: 2.7 mg/dL — ABNORMAL HIGH (ref 0.3–1.2)

## 2015-10-14 LAB — PROTIME-INR
INR: 1.35
Prothrombin Time: 16.8 seconds — ABNORMAL HIGH (ref 11.4–15.0)

## 2015-10-14 LAB — SALICYLATE LEVEL

## 2015-10-14 LAB — APTT: APTT: 36 s (ref 24–36)

## 2015-10-14 LAB — ACETAMINOPHEN LEVEL: Acetaminophen (Tylenol), Serum: <10 ug/mL — ABNORMAL LOW (ref 10–30)

## 2015-10-14 LAB — LIPASE, BLOOD: Lipase: 23 U/L (ref 11–51)

## 2015-10-14 MED ORDER — HEPARIN (PORCINE) IN NACL 100-0.45 UNIT/ML-% IJ SOLN
850.0000 [IU]/h | INTRAMUSCULAR | Status: DC
  Administered 2015-10-14: 700 [IU]/h via INTRAVENOUS
  Filled 2015-10-14 (×2): qty 250

## 2015-10-14 MED ORDER — POTASSIUM 99 MG PO TABS: 99.0000 mg | ORAL_TABLET | Freq: Every day | ORAL | Status: DC

## 2015-10-14 MED ORDER — FOLIC ACID 1 MG PO TABS: 1.0000 mg | ORAL_TABLET | Freq: Every day | ORAL | Status: DC

## 2015-10-14 MED ORDER — PANTOPRAZOLE SODIUM 40 MG PO TBEC
40.0000 mg | DELAYED_RELEASE_TABLET | Freq: Every day | ORAL | Status: DC
  Administered 2015-10-15 – 2015-10-22 (×8): 40 mg via ORAL
  Filled 2015-10-14 (×8): qty 1

## 2015-10-14 MED ORDER — LORAZEPAM 2 MG/ML IJ SOLN
0.0000 mg | Freq: Four times a day (QID) | INTRAMUSCULAR | Status: AC
Start: 2015-10-14 — End: 2015-10-16
  Administered 2015-10-15 (×2): 1 mg via INTRAVENOUS
  Filled 2015-10-14 (×2): qty 1

## 2015-10-14 MED ORDER — VITAMIN B-1 100 MG PO TABS: 100.0000 mg | ORAL_TABLET | Freq: Every day | ORAL | Status: DC

## 2015-10-14 MED ORDER — ADULT MULTIVITAMIN W/MINERALS CH
1.0000 | ORAL_TABLET | Freq: Every day | ORAL | Status: DC
  Administered 2015-10-15 – 2015-10-22 (×8): 1 via ORAL
  Filled 2015-10-14 (×8): qty 1

## 2015-10-14 MED ORDER — LORAZEPAM 2 MG/ML IJ SOLN
1.0000 mg | Freq: Four times a day (QID) | INTRAMUSCULAR | Status: AC | PRN
  Administered 2015-10-15 – 2015-10-16 (×2): 1 mg via INTRAVENOUS
  Filled 2015-10-14 (×2): qty 1

## 2015-10-14 MED ORDER — THIAMINE HCL 100 MG/ML IJ SOLN
100.0000 mg | Freq: Every day | INTRAMUSCULAR | Status: DC
  Administered 2015-10-14: 100 mg via INTRAVENOUS

## 2015-10-14 MED ORDER — VITAMIN B-1 100 MG PO TABS
100.0000 mg | ORAL_TABLET | Freq: Every day | ORAL | Status: DC
  Administered 2015-10-15 – 2015-10-22 (×8): 100 mg via ORAL
  Filled 2015-10-14 (×8): qty 1

## 2015-10-14 MED ORDER — LORAZEPAM 2 MG/ML IJ SOLN: 0.0000 mg | Freq: Two times a day (BID) | INTRAMUSCULAR | Status: AC

## 2015-10-14 MED ORDER — HEPARIN BOLUS VIA INFUSION
2500.0000 [IU] | Freq: Once | INTRAVENOUS | Status: AC
  Administered 2015-10-14: 2500 [IU] via INTRAVENOUS
  Filled 2015-10-14: qty 2500

## 2015-10-14 MED ORDER — CLONAZEPAM 0.5 MG PO TABS
0.5000 mg | ORAL_TABLET | Freq: Two times a day (BID) | ORAL | Status: DC
  Administered 2015-10-14 – 2015-10-18 (×8): 0.5 mg via ORAL
  Filled 2015-10-14 (×8): qty 1

## 2015-10-14 MED ORDER — DIAZEPAM 5 MG/ML IJ SOLN
10.0000 mg | Freq: Once | INTRAMUSCULAR | Status: AC
  Administered 2015-10-14: 10 mg via INTRAVENOUS
  Filled 2015-10-14: qty 2

## 2015-10-14 MED ORDER — ADULT MULTIVITAMIN W/MINERALS CH: 1.0000 | ORAL_TABLET | Freq: Every day | ORAL | Status: DC

## 2015-10-14 MED ORDER — CALCIUM CARBONATE-VITAMIN D 500-200 MG-UNIT PO TABS
1.0000 | ORAL_TABLET | Freq: Every day | ORAL | Status: DC
  Administered 2015-10-15 – 2015-10-22 (×8): 1 via ORAL
  Filled 2015-10-14 (×8): qty 1

## 2015-10-14 MED ORDER — SODIUM CHLORIDE 0.9 % IV BOLUS (SEPSIS)
1000.0000 mL | Freq: Once | INTRAVENOUS | Status: AC
  Administered 2015-10-14: 1000 mL via INTRAVENOUS

## 2015-10-14 MED ORDER — LORAZEPAM 2 MG PO TABS
0.0000 mg | ORAL_TABLET | Freq: Four times a day (QID) | ORAL | Status: AC
  Administered 2015-10-15 – 2015-10-16 (×2): 2 mg via ORAL
  Filled 2015-10-14 (×2): qty 1

## 2015-10-14 MED ORDER — BOOST / RESOURCE BREEZE PO LIQD: 1.0000 | Freq: Three times a day (TID) | ORAL | Status: DC

## 2015-10-14 MED ORDER — ONDANSETRON HCL 4 MG PO TABS
4.0000 mg | ORAL_TABLET | Freq: Four times a day (QID) | ORAL | Status: DC | PRN
  Administered 2015-10-15 – 2015-10-17 (×4): 4 mg via ORAL
  Filled 2015-10-14 (×4): qty 1

## 2015-10-14 MED ORDER — THIAMINE HCL 100 MG/ML IJ SOLN
100.0000 mg | Freq: Every day | INTRAMUSCULAR | Status: DC
  Filled 2015-10-14: qty 2

## 2015-10-14 MED ORDER — LORAZEPAM 2 MG PO TABS
0.0000 mg | ORAL_TABLET | Freq: Two times a day (BID) | ORAL | Status: DC
  Administered 2015-10-17 – 2015-10-18 (×3): 2 mg via ORAL
  Administered 2015-10-19: 4 mg via ORAL
  Administered 2015-10-19: 2 mg via ORAL
  Administered 2015-10-20 (×2): 1 mg via ORAL
  Filled 2015-10-14: qty 2
  Filled 2015-10-14: qty 4
  Filled 2015-10-14 (×7): qty 1

## 2015-10-14 MED ORDER — MAGNESIUM OXIDE 400 (241.3 MG) MG PO TABS
400.0000 mg | ORAL_TABLET | Freq: Two times a day (BID) | ORAL | Status: DC
  Administered 2015-10-14 – 2015-10-22 (×16): 400 mg via ORAL
  Filled 2015-10-14 (×16): qty 1

## 2015-10-14 MED ORDER — FOLIC ACID 1 MG PO TABS
1.0000 mg | ORAL_TABLET | Freq: Every day | ORAL | Status: DC
  Administered 2015-10-15 – 2015-10-22 (×8): 1 mg via ORAL
  Filled 2015-10-14 (×8): qty 1

## 2015-10-14 MED ORDER — DIAZEPAM 5 MG/ML IJ SOLN: 5.0000 mg | Freq: Once | INTRAMUSCULAR | Status: DC

## 2015-10-14 MED ORDER — LORAZEPAM 1 MG PO TABS
1.0000 mg | ORAL_TABLET | Freq: Four times a day (QID) | ORAL | Status: AC | PRN
  Filled 2015-10-14: qty 1

## 2015-10-14 NOTE — ED Notes (Signed)
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

## 2015-10-14 NOTE — ED Provider Notes (Signed)
[facility] Emergency Department Provider Note  ____________________________________________  Time seen: 5:30 PM  I have reviewed the triage vital signs and the nursing notes.   HISTORY  Chief Complaint Ascites    HPI Kristine Macias is a 30 y.o. female sent to the ED by GI Dr. Thurmond Butts for admission. Patient had outpatient ultrasound of her liver this morning due to the chronically elevated LFTs and worsening abdominal swelling,, have portal vein thrombosis and new-onset ascites. He is sent for further workup including likely MRCP and anticoagulation.   She complains of generalized abdominal pain. She is a daily drinker, last alcohol was this morning. Reports a history of shaking and seizures with abstinence. She does feel a little bit tremulous at this point. No hallucinations.  Denies history of IV drug use. No fever or vomiting.     Past Medical History  Diagnosis Date  . Depression with anxiety initally at age 40  . Chlamydia 2007    bacterial vaginosis 09/2011  . Irregular heart beat 2010    wt/diet related after evaluation  . Renal disorder   . Pneumothorax, spontaneous, tension   . Alcohol abuse 11/2014    drinking since age 30. chronic, recurrent. at least 2 detox admits before 2015.   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   . Failure to thrive in adult 12/2014.     Malnutrition: n/v, not eating, weight loss, BMI 14.  s/p 01/11/2015 PEG (Dr Dorna Leitz).   . Alcoholic hepatitis 10/3293    hepatic steatosis on 11/2014 ultrasound.   . Seizure ([place]) 06/2015    due to ETOH/benzo withdrawal.   . Thrombocytopenia ([place]) 04/2007  . Colitis 12/2014    colonoscopy for diarrhea and abnml CT 01/06/15: erythmatous TI (path: active ileitis, ? emerging IBD), rectal erythema (path: mucosal prolapse). Random bx of normal colon (path unremarkable)   . Spontaneous pneumothorax 08/2012    right.  chest tube placed.      Patient Active Problem List    Diagnosis Date Noted  . CAP (community acquired pneumonia) 10/05/2015  . Hepatitis 08/08/2015  . Nausea & vomiting 08/08/2015  . Alcoholic hepatitis without ascites   . Thrombocytopenia ([place]) 07/11/2015  . Hyponatremia 07/11/2015  . Alcoholic hepatitis with ascites   . Alcoholic hepatitis 18/84/1660  . Alcohol intoxication ([place]) 07/09/2015  . Withdrawal seizures ([place]) 07/01/2015  . 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 Drug withdrawal seizure ([place]) 06/21/2015  . Chest pain 05/20/2015  . GAD (generalized anxiety disorder) 04/17/2015  . Panic disorder 04/17/2015  . Alcohol withdrawal ([place]) 04/16/2015  . Abdominal pain, generalized 04/16/2015  . Vomiting and diarrhea   . Failure to thrive in adult   . Intractable nausea and vomiting 03/01/2015  . Hypokalemia 03/01/2015  . Protein-calorie malnutrition, severe ([place]) 01/05/2015  . Alcohol abuse   . Generalized anxiety disorder 11/26/2014    Class: Chronic  . Alcohol use disorder, severe, dependence ([place]) 11/25/2014  . Adnexal mass 04/10/2012     Past Surgical History  Procedure Laterality Date  . Cesarean section  04/2007, 07/2009     x 2.  G3, Para 2012 as of 07/2009.   Marland Kitchen Chest tube insertion    . Esophagogastroduodenoscopy N/A 01/06/2015    ;[facility], Dr Rayann Heman. For wt loss, N/V: Normal study, duodenal biopsy/pathology: chronic active duodenitis.   . Colonoscopy with propofol N/A 01/06/2015    [facility], Dr Rayann Heman. colonoscopy for diarrhea and abnml CT 01/06/15: erythmatous TI (path: active  ileitis, ? emerging IBD), rectal erythema (path: mucosal prolapse). Random bx of normal colon (path unremarkable)   . Peg placement N/A 01/11/2015    [facility], Dr Rayann Heman.  for N/V/wt loss/severe malnutrition.   . Esophagogastroduodenoscopy N/A 01/11/2015    Rein-normal with PEG placement     Current Outpatient Rx  Name  Route  Sig  Dispense  Refill  . CALCIUM-VITAMIN D PO   Oral   Take 1 tablet by mouth daily.         . clonazePAM (KLONOPIN) 0.5 MG tablet    Oral   Take 0.5 mg by mouth 2 (two) times daily.      0   . feeding supplement (BOOST / RESOURCE BREEZE) LIQD   Oral   Take 1 Container by mouth 3 (three) times daily between meals.      0   . folic acid (FOLVITE) 1 MG tablet   Oral   Take 1 tablet (1 mg total) by mouth daily.   30 tablet   0   . levofloxacin (LEVAQUIN) 750 MG tablet   Oral   Take 1 tablet (750 mg total) by mouth daily.   5 tablet   0   . Magnesium Oxide 400 (240 MG) MG TABS   Oral   Take 400 mg by mouth 2 (two) times daily.   60 tablet   0   . Multiple Vitamin (MULTIVITAMIN WITH MINERALS) TABS tablet   Oral   Take 1 tablet by mouth daily.         Marland Kitchen omeprazole (PRILOSEC) 20 MG capsule   Oral   Take 20 mg by mouth daily.         . ondansetron (ZOFRAN) 4 MG tablet   Oral   Take 4 mg by mouth every 6 (six) hours as needed for nausea or vomiting.          . thiamine 100 MG tablet   Oral   Take 1 tablet (100 mg total) by mouth daily.   30 tablet   0   . Water For Irrigation, Sterile (FREE WATER) SOLN   Per Tube   Place 200 mLs into feeding tube every 8 (eight) hours. Patient not taking: Reported on 06/21/2015   1000 mL   5      Allergies Aspirin; Effexor; Sulfa antibiotics; Tetracyclines & related; Trazodone and nefazodone; Latex; Paxil; Seroquel; and Zoloft   Family History  Problem Relation Age of Onset  . Anesthesia problems Neg Hx   . Thyroid disease Mother   . Cancer Father   . Stroke Other   . Crohn's disease Brother     Social History Social History  Substance Use Topics  . Smoking status: Current Every Day Smoker -- 2.00 packs/day    Types: Cigarettes  . Smokeless tobacco: Never Used  . Alcohol Use: 21.6 oz/week    36 Cans of beer per week     Comment: 6 beers/day; trying to quit, participating in [place]    Review of Systems  Constitutional:   No fever or chills. No weight changes Eyes:   No blurry vision or double vision.  ENT:   No sore throat.   Cardiovascular:   No chest pain. Respiratory:   No dyspnea or cough. Gastrointestinal:   Generalized abdominal pain without vomiting and diarrhea.  No BRBPR or melena. Genitourinary:   Negative for dysuria or difficulty urinating. Musculoskeletal:   Negative for back pain. No joint swelling or pain. Skin:   Negative  for rash. Neurological:   Negative for headaches, focal weakness or numbness. Psychiatric:  Positive anxiety. Alcohol dependence.  Endocrine:  No changes in energy or sleep difficulty.  10-point ROS otherwise negative.  ____________________________________________   PHYSICAL EXAM:  VITAL SIGNS: ED Triage Vitals  Enc Vitals Group     BP 10/14/15 1718 98/72 mmHg     Pulse Rate 10/14/15 1718 102     Resp 10/14/15 1718 16     Temp 10/14/15 1718 98.1 F (36.7 C)     Temp Source 10/14/15 1718 Oral     SpO2 10/14/15 1718 97 %     Weight 10/14/15 1718 94 lb (42.638 kg)     Height 10/14/15 1718 5' 6"  (1.676 m)     Head Cir --      Peak Flow --      Pain Score 10/14/15 1718 10     Pain Loc --      Pain Edu? --      Excl. in [place]? --     Vital signs reviewed, nursing assessments reviewed.   Constitutional:   Alert and oriented. Ill-appearing. Malnourished. Eyes:   No scleral icterus. No conjunctival pallor. PERRL. EOMI ENT   Head:   Normocephalic and atraumatic.   Nose:   No congestion/rhinnorhea. No septal hematoma   Mouth/Throat:   Dry mucous membranes, no pharyngeal erythema. No peritonsillar mass.    Neck:   No stridor. No SubQ emphysema. No meningismus. Hematological/Lymphatic/Immunilogical:   No cervical lymphadenopathy. Cardiovascular:   Tachycardia heart rate 105. Symmetric bilateral radial and DP pulses.  No murmurs.  Respiratory:   Normal respiratory effort without tachypnea nor retractions. Breath sounds are clear and equal bilaterally. No wheezes/rales/rhonchi. Gastrointestinal:   Soft and mildly distended. Generalized tenderness. No  peritonitis.. There is no CVA tenderness.  No rebound, rigidity, or guarding. Genitourinary:   deferred Musculoskeletal:   Nontender with normal range of motion in all extremities. No joint effusions.  No lower extremity tenderness.  No edema. Neurologic:   Normal speech and language.  CN 2-10 normal. Motor grossly intact. No gross focal neurologic deficits are appreciated.  Skin:    Skin is warm, dry and intact. No rash noted.  No petechiae, purpura, or bullae. Psychiatric:   Mood and affect are normal. No SI or hallucinations ____________________________________________    LABS (pertinent positives/negatives) (all labs ordered are listed, but only abnormal results are displayed) Labs Reviewed  ACETAMINOPHEN LEVEL  BASIC METABOLIC PANEL  HEPATIC FUNCTION PANEL  CBC WITH DIFFERENTIAL/PLATELET  LIPASE, BLOOD  SALICYLATE LEVEL  PROTIME-INR  APTT   ____________________________________________   EKG    ____________________________________________    RADIOLOGY  Ultrasound from earlier today showing nonocclusive thrombosis of portal vein reviewed.  ____________________________________________   PROCEDURES   ____________________________________________   INITIAL IMPRESSION / ASSESSMENT AND PLAN / ED COURSE  Pertinent labs & imaging results that were available during my care of the patient were reviewed by me and considered in my medical decision making (see chart for details).  Patient presents with generalized abdominal pain, chronic hepatitis, new onset ascites. We'll admit for further evaluation and management. Heparin drip. IV fluids. Appears to have some mild alcohol withdrawal at present time but likely to get worse. We'll give IV Valium and CIWA protocol.  Case discussed with hospitalist for admission.   ----------------------------------------- 7:32 PM on 10/14/2015 -----------------------------------------  Labs consistent with known prior issues. No  acute critical illness requiring immediate intervention in the ED. Further inpatient care by hospitalist  team.    ____________________________________________   FINAL CLINICAL IMPRESSION(S) / ED DIAGNOSES  Final diagnoses:  Hepatitis  Ascites  Alcohol dependence with uncomplicated withdrawal ([place])  Portal vein thrombosis      Carrie Mew, MD 10/14/15 1933

## 2015-10-14 NOTE — Consult Note (Addendum)
ANTICOAGULATION CONSULT NOTE - Initial Consult  Pharmacy Consult for Heparin Indication: Portal Vein Thrombosis  Allergies  Allergen Reactions  . Aspirin Shortness Of Breath  . Effexor [Venlafaxine] Anaphylaxis  . Sulfa Antibiotics Anaphylaxis  . Tetracyclines & Related Anaphylaxis  . Trazodone And Nefazodone Anaphylaxis  . Latex Hives, Itching and Rash  . Paxil [Paroxetine] Hives, Itching and Rash  . Seroquel [Quetiapine Fumarate] Hives, Itching and Rash  . Zoloft [Sertraline Hcl] Hives, Itching and Rash    Patient Measurements: Height: 5' 6"  (167.6 cm) Weight: 94 lb (42.638 kg) IBW/kg (Calculated) : 59.3 Heparin Dosing Weight: 42.6 kg  Vital Signs: Temp: 98.1 F (36.7 C) (02/17 1718) Temp Source: Oral (02/17 1718) BP: 98/72 mmHg (02/17 1718) Pulse Rate: 102 (02/17 1718)  Labs: No results for input(s): HGB, HCT, PLT, APTT, LABPROT, INR, HEPARINUNFRC, HEPRLOWMOCWT, CREATININE, CKTOTAL, CKMB, TROPONINI in the last 72 hours.  Estimated Creatinine Clearance: 69.2 mL/min (by C-G formula based on Cr of 0.38).   Medical History: Past Medical History  Diagnosis Date  . Depression with anxiety initally at age 81  . Chlamydia 2007    bacterial vaginosis 09/2011  . Irregular heart beat 2010    wt/diet related after evaluation  . Renal disorder   . Pneumothorax, spontaneous, tension   . Alcohol abuse 11/2014    drinking since age 65. chronic, recurrent. at least 2 detox admits before 2015.   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   . Failure to thrive in adult 12/2014.     Malnutrition: n/v, not eating, weight loss, BMI 14.  s/p 01/11/2015 PEG (Dr Dorna Leitz).   . Alcoholic hepatitis 11/812    hepatic steatosis on 11/2014 ultrasound.   . Seizure ([place]) 06/2015    due to ETOH/benzo withdrawal.   . Thrombocytopenia ([place]) 04/2007  . Colitis 12/2014    colonoscopy for diarrhea and abnml CT 01/06/15: erythmatous TI (path: active ileitis, ? emerging IBD), rectal erythema (path:  mucosal prolapse). Random bx of normal colon (path unremarkable)   . Spontaneous pneumothorax 08/2012    right.  chest tube placed.     Medications:   (Not in a hospital admission) Scheduled:  . diazepam  5 mg Intravenous Once  . LORazepam  0-4 mg Intravenous 4 times per day  . LORazepam  0-4 mg Intravenous Q12H  . LORazepam  0-4 mg Oral 4 times per day  . LORazepam  0-4 mg Oral Q12H  . thiamine  100 mg Intravenous Daily  . thiamine  100 mg Oral Daily    Assessment: Kristine Macias is a 30yo female sent to the ED by GI for admission after patient found to have portal vein thrombosis and new-onset ascites 2/2 alcoholism. Pharmacy consulted to dose heparin in this patient.  Goal of Therapy:  Heparin level 0.3-0.7 units/ml Monitor platelets by anticoagulation protocol: Yes   Plan:  Give 2500 units bolus x 1 Start heparin infusion at 700 units/hr Check anti-Xa level in 6 hours and daily while on heparin Continue to monitor H&H and platelets  Heparin Level due 02/18 at 0100, pharmacy will follow and adjust as needed.  [name] 10/14/2015,6:20 PM

## 2015-10-14 NOTE — Telephone Encounter (Signed)
GI Note:  U/s done today for abd distension, elev LFT  U/s shows new portal vein thrombosis along with dilated CBD and possible cholecystitis, and new ascites.  Labs show continued severe elev in liver enzymes.   I recommend admission for anti-coagulation for the PVT, likely MRCP, and w/u of the new onset ascites.

## 2015-10-14 NOTE — H&P (Addendum)
[facility] [place] NAME: Kristine Macias    MR#:  [id number]  DATE OF BIRTH:  Nov 05, 1985  DATE OF ADMISSION:  10/14/2015  PRIMARY CARE PHYSICIAN: Leonides Sake, MD   REQUESTING/REFERRING PHYSICIAN: Joni Fears  CHIEF COMPLAINT:   Chief Complaint  Patient presents with  . Ascites    HISTORY OF PRESENT ILLNESS: Kristine Macias  is a 30 y.o. female with a known history of depression, renal disorder, alcohol abuse, pancreas divisum, mild irritation, alcoholic hepatitis, alcohol withdrawal seizures, thrombocytopenia, spontaneous pneumothorax- was discharged from hospital last week and advised to follow-up in GI clinic for hepatitis issue. GI clinic ordered this ultrasound abdomen, which she came to the hospital today. Ultrasound done and the report sent to GI doctor's office and she was supposed to go to the office in next week or 2. As per the report there was portal vein thrombosis and so Dr. Rayann Heman called her to go to the emergency room immediately. Patient also is complaining of generalized abdominal pain, and her abdomen is gradually getting bigger.   PAST MEDICAL HISTORY:   Past Medical History  Diagnosis Date  . Depression with anxiety initally at age 60  . Chlamydia 2007    bacterial vaginosis 09/2011  . Irregular heart beat 2010    wt/diet related after evaluation  . Renal disorder   . Pneumothorax, spontaneous, tension   . Alcohol abuse 11/2014    drinking since age 21. chronic, recurrent. at least 2 detox admits before 2015.   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   . Failure to thrive in adult 12/2014.     Malnutrition: n/v, not eating, weight loss, BMI 14.  s/p 01/11/2015 PEG (Dr Dorna Leitz).   . Alcoholic hepatitis 04/1637    hepatic steatosis on 11/2014 ultrasound.   . Seizure ([place]) 06/2015    due to ETOH/benzo withdrawal.   . Thrombocytopenia ([place]) 04/2007  . Colitis 12/2014    colonoscopy for diarrhea and abnml CT  01/06/15: erythmatous TI (path: active ileitis, ? emerging IBD), rectal erythema (path: mucosal prolapse). Random bx of normal colon (path unremarkable)   . Spontaneous pneumothorax 08/2012    right.  chest tube placed.     PAST SURGICAL HISTORY: Past Surgical History  Procedure Laterality Date  . Cesarean section  04/2007, 07/2009     x 2.  G3, Para 2012 as of 07/2009.   Marland Kitchen Chest tube insertion    . Esophagogastroduodenoscopy N/A 01/06/2015    ;[facility], Dr Rayann Heman. For wt loss, N/V: Normal study, duodenal biopsy/pathology: chronic active duodenitis.   . Colonoscopy with propofol N/A 01/06/2015    [facility], Dr Rayann Heman. colonoscopy for diarrhea and abnml CT 01/06/15: erythmatous TI (path: active ileitis, ? emerging IBD), rectal erythema (path: mucosal prolapse). Random bx of normal colon (path unremarkable)   . Peg placement N/A 01/11/2015    [facility], Dr Rayann Heman.  for N/V/wt loss/severe malnutrition.   . Esophagogastroduodenoscopy N/A 01/11/2015    Rein-normal with PEG placement    SOCIAL HISTORY:  Social History  Substance Use Topics  . Smoking status: Current Every Day Smoker -- 2.00 packs/day    Types: Cigarettes  . Smokeless tobacco: Never Used  . Alcohol Use: 21.6 oz/week    36 Cans of beer per week     Comment: 6 beers/day; trying to quit, participating in [place]:  Family History  Problem Relation Age of Onset  .  Anesthesia problems Neg Hx   . Thyroid disease Mother   . Cancer Father   . Stroke Other   . Crohn's disease Brother     DRUG ALLERGIES:  Allergies  Allergen Reactions  . Aspirin Shortness Of Breath  . Effexor [Venlafaxine] Anaphylaxis  . Sulfa Antibiotics Anaphylaxis  . Tetracyclines & Related Anaphylaxis  . Trazodone And Nefazodone Anaphylaxis  . Latex Hives and Itching  . Paxil [Paroxetine] Hives and Itching  . Seroquel [Quetiapine Fumarate] Hives and Itching  . Zoloft [Sertraline Hcl] Hives and Itching    REVIEW OF SYSTEMS:   CONSTITUTIONAL: No fever,  fatigue or weakness.  EYES: No blurred or double vision.  EARS, NOSE, AND THROAT: No tinnitus or ear pain.  RESPIRATORY: No cough, shortness of breath, wheezing or hemoptysis.  CARDIOVASCULAR: No chest pain, orthopnea, edema.  GASTROINTESTINAL: No nausea, vomiting, diarrhea , she have abdominal pain.  GENITOURINARY: No dysuria, hematuria.  ENDOCRINE: No polyuria, nocturia,  HEMATOLOGY: No anemia, easy bruising or bleeding SKIN: No rash or lesion. MUSCULOSKELETAL: No joint pain or arthritis.   NEUROLOGIC: No tingling, numbness, weakness.  PSYCHIATRY: No anxiety or depression.   MEDICATIONS AT HOME:  Prior to Admission medications   Medication Sig Start Date End Date Taking? Authorizing Provider  Calcium Carb-Cholecalciferol (CALCIUM 500 +D) 500-400 MG-UNIT TABS Take 1 tablet by mouth daily.   Yes Historical Provider, MD  clonazePAM (KLONOPIN) 0.5 MG tablet Take 0.5 mg by mouth 2 (two) times daily.   Yes Historical Provider, MD  folic acid (FOLVITE) 1 MG tablet Take 1 tablet (1 mg total) by mouth daily. 07/14/15  Yes Theodis Blaze, MD  Magnesium Oxide 400 (240 MG) MG TABS Take 400 mg by mouth 2 (two) times daily. 07/14/15  Yes Theodis Blaze, MD  Multiple Vitamin (MULTIVITAMIN WITH MINERALS) TABS tablet Take 1 tablet by mouth daily.   Yes Historical Provider, MD  omeprazole (PRILOSEC) 20 MG capsule Take 20 mg by mouth daily.   Yes Historical Provider, MD  ondansetron (ZOFRAN) 4 MG tablet Take 4 mg by mouth every 6 (six) hours as needed for nausea or vomiting.    Yes Historical Provider, MD  Potassium 99 MG TABS Take 99 mg by mouth daily.   Yes Historical Provider, MD  thiamine 100 MG tablet Take 1 tablet (100 mg total) by mouth daily. 07/14/15  Yes Theodis Blaze, MD  feeding supplement (BOOST / RESOURCE BREEZE) LIQD Take 1 Container by mouth 3 (three) times daily between meals. Patient not taking: Reported on 10/14/2015 08/10/15   Geradine Girt, DO  levofloxacin (LEVAQUIN) 750 MG tablet Take  1 tablet (750 mg total) by mouth daily. Patient not taking: Reported on 10/14/2015 10/05/15   Bettey Costa, MD  Water For Irrigation, Sterile (FREE WATER) SOLN Place 200 mLs into feeding tube every 8 (eight) hours. Patient not taking: Reported on 06/21/2015 05/23/15   Gladstone Lighter, MD      PHYSICAL EXAMINATION:   VITAL SIGNS: Blood pressure 98/72, pulse 102, temperature 98.1 F (36.7 C), temperature source Oral, resp. rate 16, height 5' 6"  (1.676 m), weight 42.638 kg (94 lb), last menstrual period 07/28/2015, SpO2 97 %.  GENERAL:  30 y.o.-year-old very thin and malnourished patient lying in the bed with no acute distress.  EYES: Pupils equal, round, reactive to light and accommodation. No scleral icterus. Extraocular muscles intact.  HEENT: Head atraumatic, normocephalic. Oropharynx and nasopharynx clear.  NECK:  Supple, no jugular venous distention. No thyroid enlargement, no tenderness.  LUNGS: Normal breath sounds bilaterally, no wheezing, rales,rhonchi or crepitation. No use of accessory muscles of respiration.  CARDIOVASCULAR: S1, S2 normal. No murmurs, rubs, or gallops.  ABDOMEN: Soft, mild  tender, distended. Bowel sounds present. No organomegaly or mass.  EXTREMITIES: No pedal edema, cyanosis, or clubbing.  NEUROLOGIC: Cranial nerves II through XII are intact. Muscle strength 5/5 in all extremities. Sensation intact. Gait not checked.  PSYCHIATRIC: The patient is alert and oriented x 3.  SKIN: No obvious rash, lesion, or ulcer.   LABORATORY PANEL:   CBC  Recent Labs Lab 10/14/15 1820  WBC 9.4  HGB 11.0*  HCT 31.5*  PLT 143*  MCV 103.0*  MCH 35.9*  MCHC 34.9  RDW 19.0*  LYMPHSABS 2.0  MONOABS 0.4  EOSABS 0.0  BASOSABS 0.2*   ------------------------------------------------------------------------------------------------------------------  Chemistries   Recent Labs Lab 10/14/15 1820  NA 132*  K 3.0*  CL 90*  CO2 35*  GLUCOSE 96  BUN 12  CREATININE 0.33*   CALCIUM 7.7*  AST 407*  ALT 76*  ALKPHOS 347*  BILITOT 2.7*   ------------------------------------------------------------------------------------------------------------------ estimated creatinine clearance is 69.2 mL/min (by C-G formula based on Cr of 0.33). ------------------------------------------------------------------------------------------------------------------ No results for input(s): TSH, T4TOTAL, T3FREE, THYROIDAB in the last 72 hours.  Invalid input(s): FREET3   Coagulation profile  Recent Labs Lab 10/14/15 1820  INR 1.35   ------------------------------------------------------------------------------------------------------------------- No results for input(s): DDIMER in the last 72 hours. -------------------------------------------------------------------------------------------------------------------  Cardiac Enzymes No results for input(s): CKMB, TROPONINI, MYOGLOBIN in the last 168 hours.  Invalid input(s): CK ------------------------------------------------------------------------------------------------------------------ Invalid input(s): POCBNP  ---------------------------------------------------------------------------------------------------------------  Urinalysis    Component Value Date/Time   COLORURINE ORANGE* 08/08/2015 1423   COLORURINE Yellow 12/20/2014 1018   APPEARANCEUR CLOUDY* 08/08/2015 1423   APPEARANCEUR Clear 12/20/2014 1018   LABSPEC 1.031* 08/08/2015 1423   LABSPEC 1.006 12/20/2014 1018   PHURINE 5.0 08/08/2015 1423   PHURINE 6.0 12/20/2014 1018   GLUCOSEU NEGATIVE 08/08/2015 1423   GLUCOSEU Negative 12/20/2014 1018   HGBUR MODERATE* 08/08/2015 1423   HGBUR 1+ 12/20/2014 1018   BILIRUBINUR SMALL* 08/08/2015 1423   BILIRUBINUR Negative 12/20/2014 1018   KETONESUR NEGATIVE 08/08/2015 1423   KETONESUR Negative 12/20/2014 1018   PROTEINUR 100* 08/08/2015 1423   PROTEINUR Negative 12/20/2014 1018   UROBILINOGEN 0.2  06/13/2012 2306   NITRITE NEGATIVE 08/08/2015 1423   NITRITE Negative 12/20/2014 1018   LEUKOCYTESUR TRACE* 08/08/2015 1423   LEUKOCYTESUR Negative 12/20/2014 1018     RADIOLOGY: US Abdomen Complete  10/14/2015  CLINICAL DATA:  Ascites with elevated liver function studies. Previous thoracentesis. EXAM: ABDOMEN ULTRASOUND COMPLETE COMPARISON:  Abdominal CT 08/08/2015 FINDINGS: Gallbladder: The gallbladder is distended, measuring up to 5.5 cm, similar to previous CT. There is some edema within the gallbladder wall which is borderline thickened to 3.1 mm. No evidence of gallstones. Negative sonographic Murphy's sign. Common bile duct: Diameter: Fusiform dilatation to 11 mm, mildly increased from previous CT. Liver: The liver is echogenic without definite contour irregularity or focal abnormality. IVC: No abnormality visualized. Pancreas: Visualized portion unremarkable. Spleen: Size and appearance within normal limits. Right Kidney: Length: 10.0 cm. Echogenicity within normal limits. No mass or hydronephrosis visualized. Left Kidney: Length: 10.2 cm. Echogenicity within normal limits. No mass or hydronephrosis visualized. Abdominal aorta: No abnormality seen. The aortic bifurcation is obscured by bowel gas. Other findings: Interval development of a small to moderate amount of ascites. IMPRESSION: 1. Interval development of a small to moderate amount of ascites. 2. Distended gallbladder with mild gallbladder  wall thickening, nonspecific in this clinical context. The degree of gallbladder distention is similar to previous CT. 3. Mild fusiform dilatation of the common bile duct. No evidence of cholelithiasis or choledocholithiasis. Electronically Signed   By: Richardean Sale M.D.   On: 10/14/2015 11:56   [place] Art/ven Flow Abd Pelv Doppler  10/14/2015  CLINICAL DATA:  Elevated liver enzymes EXAM: DUPLEX ULTRASOUND OF LIVER TECHNIQUE: Color and duplex Doppler ultrasound was performed to evaluate the hepatic  in-flow and out-flow vessels. COMPARISON:  CT 08/08/2015 and previous FINDINGS: Portal Vein: Nonocclusive thrombus suggested in the distal main portal vein, which was not evident on prior CT. Velocities (all hepatopetal): Main:  9-19 cm/sec Right:  14 cm/sec Left:  15 cm/sec Hepatic Vein Velocities (all hepatofugal): Right:  70 cm/sec Middle:  35 cm/sec Left:  12 cm/sec Hepatic Artery Velocity:  160 cm/sec Spleen 8.8 x 9.6 x 3.4 cm. Splenic Vein: No occlusion or thrombus. Velocity: 8.7 cm/sec Varices: None seen Ascites: Present IMPRESSION: 1. Possible nonocclusive thrombus in the distal main portal vein, which was not present on prior CT of 08/08/2015. Repeat abdomen CT with contrast may be useful to confirm, and assess for mesenteric venous thrombosis. 2. Normal flow direction and velocities in the hepatic vessels as above. 3. Small amount of abdominal ascites, new since prior study. Electronically Signed   By: Lucrezia Europe M.D.   On: 10/14/2015 12:06    EKG: Orders placed or performed during the hospital encounter of 08/08/15  . ED EKG within 10 minutes  . ED EKG within 10 minutes  . EKG    IMPRESSION AND PLAN:  * Portal Vein thrombosis  Admit to hospital, give IV heparin drip.  Get GI consult.  * Alcoholic hepatitis and ascites  GI consult for further management.  * Chronic alcoholism  Last drink was yesterday, she had alcohol withdrawal and seizures in the past  We high-risk all cause withdrawal in this admission also, we'll keep monitoring for withdrawal signed.  * Chronic malnutrition  This is secondary to chronic alcoholism, continue that is supplemented.  * Smoking  Counseled to quit smoking for 4 minutes, offered nicotine patch.  All the records are reviewed and case discussed with ED provider. Management plans discussed with the patient, family and they are in agreement.  CODE STATUS: Code Status History    Date Active Date Inactive Code Status Order ID Comments User Context    10/05/2015  3:34 AM 10/05/2015  5:53 PM Full Code [id number]  Lance Coon, MD ED   08/08/2015  5:52 PM 08/10/2015  8:21 PM Full Code [id number]  Elmarie Shiley, MD Inpatient   07/09/2015  6:11 PM 07/14/2015  5:56 PM Full Code [id number]  Theodis Blaze, MD Inpatient   07/01/2015 12:32 AM 07/02/2015  5:52 PM Full Code [id number]  Lytle Butte, MD ED   06/21/2015 12:25 PM 06/22/2015  4:08 PM Full Code [id number]  Epifanio Lesches, MD ED   05/20/2015  7:56 PM 05/23/2015  3:55 PM Full Code [id number]  Fritzi Mandes, MD Inpatient   04/16/2015  9:21 PM 04/18/2015  4:55 PM Full Code [id number]  Idelle Crouch, MD Inpatient   03/01/2015  8:52 PM 03/03/2015  4:05 PM Full Code [id number]  Lytle Butte, MD ED   01/04/2015  7:23 PM 01/13/2015  7:12 PM Full Code [id number]  Fritzi Mandes, MD ED   11/24/2014  6:21 PM 11/26/2014  1:29 PM Full Code [id number]  Serita Grit, MD ED  TOTAL TIME TAKING CARE OF THIS PATIENT: 50 minutes.    Vaughan Basta M.D on 10/14/2015   Between 7am to 6pm - Pager - [phone]  After 6pm go to [email or web address] - password [email or web address]  Office  [phone]  CC: Primary care physician; Leonides Sake, MD   Note: This dictation was prepared with Dragon dictation along with smaller phrase technology. Any transcriptional errors that result from this process are unintentional.

## 2015-10-14 NOTE — ED Notes (Signed)
Pt sent from MD office with portal vein clot and gross ascites. Pt drinks 3-4 large beers (8%) per day; has not had any alcohol today. Pt tearful and states she wants to get help to quit drinking.

## 2015-10-15 ENCOUNTER — Inpatient Hospital Stay: Payer: Medicaid Other

## 2015-10-15 LAB — BASIC METABOLIC PANEL
Anion gap: 6 (ref 5–15)
BUN: 11 mg/dL (ref 6–20)
CHLORIDE: 94 mmol/L — AB (ref 101–111)
CO2: 32 mmol/L (ref 22–32)
CREATININE: 0.34 mg/dL — AB (ref 0.44–1.00)
Calcium: 7 mg/dL — ABNORMAL LOW (ref 8.9–10.3)
GFR calc non Af Amer: >60 mL/min (ref >60)
Glucose, Bld: 84 mg/dL (ref 65–99)
POTASSIUM: 3.3 mmol/L — AB (ref 3.5–5.1)
Sodium: 132 mmol/L — ABNORMAL LOW (ref 135–145)

## 2015-10-15 LAB — CBC
HEMATOCRIT: 29.5 % — AB (ref 35.0–47.0)
HEMOGLOBIN: 10.3 g/dL — AB (ref 12.0–16.0)
MCH: 35.8 pg — AB (ref 26.0–34.0)
MCHC: 34.9 g/dL (ref 32.0–36.0)
MCV: 102.5 fL — AB (ref 80.0–100.0)
PLATELETS: 131 10*3/uL — AB (ref 150–440)
RBC: 2.88 MIL/uL — AB (ref 3.80–5.20)
RDW: 19.5 % — ABNORMAL HIGH (ref 11.5–14.5)
WBC: 10.2 10*3/uL (ref 3.6–11.0)

## 2015-10-15 LAB — HEPATIC FUNCTION PANEL
ALK PHOS: 296 U/L — AB (ref 38–126)
ALT: 65 U/L — AB (ref 14–54)
AST: 351 U/L — AB (ref 15–41)
Albumin: 1.8 g/dL — ABNORMAL LOW (ref 3.5–5.0)
BILIRUBIN TOTAL: 2.5 mg/dL — AB (ref 0.3–1.2)
Bilirubin, Direct: 1.3 mg/dL — ABNORMAL HIGH (ref 0.1–0.5)
Indirect Bilirubin: 1.2 mg/dL — ABNORMAL HIGH (ref 0.3–0.9)
Total Protein: 5.3 g/dL — ABNORMAL LOW (ref 6.5–8.1)

## 2015-10-15 LAB — HEPARIN LEVEL (UNFRACTIONATED)
HEPARIN UNFRACTIONATED: 0.33 [IU]/mL (ref 0.30–0.70)
HEPARIN UNFRACTIONATED: 0.23 [IU]/mL — AB (ref 0.30–0.70)
Heparin Unfractionated: 0.16 IU/mL — ABNORMAL LOW (ref 0.30–0.70)
Heparin Unfractionated: 0.25 IU/mL — ABNORMAL LOW (ref 0.30–0.70)

## 2015-10-15 MED ORDER — HEPARIN BOLUS VIA INFUSION
600.0000 [IU] | Freq: Once | INTRAVENOUS | Status: AC
  Administered 2015-10-15: 600 [IU] via INTRAVENOUS
  Filled 2015-10-15: qty 600

## 2015-10-15 MED ORDER — GADOBENATE DIMEGLUMINE 529 MG/ML IV SOLN
10.0000 mL | Freq: Once | INTRAVENOUS | Status: AC | PRN
  Administered 2015-10-15: 10 mL via INTRAVENOUS

## 2015-10-15 MED ORDER — HEPARIN (PORCINE) IN NACL 100-0.45 UNIT/ML-% IJ SOLN
1100.0000 [IU]/h | INTRAMUSCULAR | Status: DC
  Administered 2015-10-15: 900 [IU]/h via INTRAVENOUS
  Administered 2015-10-16: 1000 [IU]/h via INTRAVENOUS
  Filled 2015-10-15 (×2): qty 250

## 2015-10-15 MED ORDER — HEPARIN BOLUS VIA INFUSION
1300.0000 [IU] | Freq: Once | INTRAVENOUS | Status: AC
  Administered 2015-10-15: 1300 [IU] via INTRAVENOUS
  Filled 2015-10-15: qty 1300

## 2015-10-15 NOTE — Consult Note (Signed)
ANTICOAGULATION CONSULT NOTE - Follow up Pharmacy Consult for Heparin Indication: Portal Vein Thrombosis  Allergies  Allergen Reactions  . Aspirin Shortness Of Breath  . Effexor [Venlafaxine] Anaphylaxis  . Sulfa Antibiotics Anaphylaxis  . Tetracyclines & Related Anaphylaxis  . Trazodone And Nefazodone Anaphylaxis  . Latex Hives and Itching  . Paxil [Paroxetine] Hives and Itching  . Seroquel [Quetiapine Fumarate] Hives and Itching  . Zoloft [Sertraline Hcl] Hives and Itching    Patient Measurements: Height: 5' 6"  (167.6 cm) Weight: 104 lb 4.8 oz (47.31 kg) IBW/kg (Calculated) : 59.3 Heparin Dosing Weight: 42.6 kg  Vital Signs: Temp: 98.3 F (36.8 C) (02/18 0545) Temp Source: Oral (02/18 0545) BP: 94/57 mmHg (02/18 0545) Pulse Rate: 99 (02/18 0545)  Labs:  Recent Labs  10/14/15 1820 10/15/15 0103 10/15/15 0739  HGB 11.0* 10.3*  --   HCT 31.5* 29.5*  --   PLT 143* 131*  --   APTT 36  --   --   LABPROT 16.8*  --   --   INR 1.35  --   --   HEPARINUNFRC  --  0.16* 0.25*  CREATININE 0.33* 0.34*  --     Estimated Creatinine Clearance: 76.8 mL/min (by C-G formula based on Cr of 0.34).   Medical History: Past Medical History  Diagnosis Date  . Depression with anxiety initally at age 33  . Chlamydia 2007    bacterial vaginosis 09/2011  . Irregular heart beat 2010    wt/diet related after evaluation  . Renal disorder   . Pneumothorax, spontaneous, tension   . Alcohol abuse 11/2014    drinking since age 60. chronic, recurrent. at least 2 detox admits before 2015.   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   . Failure to thrive in adult 12/2014.     Malnutrition: n/v, not eating, weight loss, BMI 14.  s/p 01/11/2015 PEG (Dr Dorna Leitz).   . Alcoholic hepatitis 04/1637    hepatic steatosis on 11/2014 ultrasound.   . Seizure ([place]) 06/2015    due to ETOH/benzo withdrawal.   . Thrombocytopenia ([place]) 04/2007  . Colitis 12/2014    colonoscopy for diarrhea and abnml CT  01/06/15: erythmatous TI (path: active ileitis, ? emerging IBD), rectal erythema (path: mucosal prolapse). Random bx of normal colon (path unremarkable)   . Spontaneous pneumothorax 08/2012    right.  chest tube placed.     Medications:  Prescriptions prior to admission  Medication Sig Dispense Refill Last Dose  . Calcium Carb-Cholecalciferol (CALCIUM 500 +D) 500-400 MG-UNIT TABS Take 1 tablet by mouth daily.   10/14/2015 at Unknown time  . clonazePAM (KLONOPIN) 0.5 MG tablet Take 0.5 mg by mouth 2 (two) times daily.  0 10/14/2015 at Unknown time  . folic acid (FOLVITE) 1 MG tablet Take 1 tablet (1 mg total) by mouth daily. 30 tablet 0 10/14/2015 at Unknown time  . Magnesium Oxide 400 (240 MG) MG TABS Take 400 mg by mouth 2 (two) times daily. 60 tablet 0 10/14/2015 at Unknown time  . Multiple Vitamin (MULTIVITAMIN WITH MINERALS) TABS tablet Take 1 tablet by mouth daily.   10/13/2015 at Unknown time  . omeprazole (PRILOSEC) 20 MG capsule Take 20 mg by mouth daily.   10/14/2015 at Unknown time  . ondansetron (ZOFRAN) 4 MG tablet Take 4 mg by mouth every 6 (six) hours as needed for nausea or vomiting.    Past Week at Unknown time  . Potassium 99 MG TABS  Take 99 mg by mouth daily.   10/14/2015 at Unknown time  . thiamine 100 MG tablet Take 1 tablet (100 mg total) by mouth daily. 30 tablet 0 10/14/2015 at Unknown time  . feeding supplement (BOOST / RESOURCE BREEZE) LIQD Take 1 Container by mouth 3 (three) times daily between meals. (Patient not taking: Reported on 10/14/2015)  0   . levofloxacin (LEVAQUIN) 750 MG tablet Take 1 tablet (750 mg total) by mouth daily. (Patient not taking: Reported on 10/14/2015) 5 tablet 0   . Water For Irrigation, Sterile (FREE WATER) SOLN Place 200 mLs into feeding tube every 8 (eight) hours. (Patient not taking: Reported on 06/21/2015) 1000 mL 5    Scheduled:  . calcium-vitamin D  1 tablet Oral Daily  . clonazePAM  0.5 mg Oral BID  . feeding supplement  1 Container Oral TID BM   . folic acid  1 mg Oral Daily  . LORazepam  0-4 mg Intravenous 4 times per day  . LORazepam  0-4 mg Intravenous Q12H  . LORazepam  0-4 mg Oral 4 times per day  . LORazepam  0-4 mg Oral Q12H  . magnesium oxide  400 mg Oral BID  . multivitamin with minerals  1 tablet Oral Daily  . pantoprazole  40 mg Oral QAC breakfast  . thiamine  100 mg Oral Daily    Assessment: Kristine Macias is a 30yo female sent to the ED by GI for admission after patient found to have portal vein thrombosis and new-onset ascites 2/2 alcoholism. Pharmacy consulted to dose heparin in this patient.  Goal of Therapy:  Heparin level 0.3-0.7 units/ml Monitor platelets by anticoagulation protocol: Yes   Plan:  2/18 at 07:39 Anti-Xa = 0.25.  Ordered bolus of 600 units.  Increased drip rate to 900 units/hr.   Will recheck heparin level in 6 hours on 2/18 at 15:15.   [place] Clinical Pharmacist 10/15/2015,8:59 AM

## 2015-10-15 NOTE — Progress Notes (Signed)
ANTICOAGULATION CONSULT NOTE - Follow Up Consult  Pharmacy Consult for Heparin  Indication: Portal Vein Thrombosis  Allergies  Allergen Reactions  . Aspirin Shortness Of Breath  . Effexor [Venlafaxine] Anaphylaxis  . Sulfa Antibiotics Anaphylaxis  . Tetracyclines & Related Anaphylaxis  . Trazodone And Nefazodone Anaphylaxis  . Latex Hives and Itching  . Paxil [Paroxetine] Hives and Itching  . Seroquel [Quetiapine Fumarate] Hives and Itching  . Zoloft [Sertraline Hcl] Hives and Itching    Patient Measurements: Height: 5' 6"  (167.6 cm) Weight: 104 lb 4.8 oz (47.31 kg) IBW/kg (Calculated) : 59.3 Heparin Dosing Weight: 42.6 kg   Vital Signs: Temp: 98.7 F (37.1 C) (02/18 1244) Temp Source: Oral (02/18 1244) BP: 101/56 mmHg (02/18 1244) Pulse Rate: 93 (02/18 1244)  Labs:  Recent Labs  10/14/15 1820  10/15/15 0103 10/15/15 0739 10/15/15 1517 10/15/15 2147  HGB 11.0*  --  10.3*  --   --   --   HCT 31.5*  --  29.5*  --   --   --   PLT 143*  --  131*  --   --   --   APTT 36  --   --   --   --   --   LABPROT 16.8*  --   --   --   --   --   INR 1.35  --   --   --   --   --   HEPARINUNFRC  --   < > 0.16* 0.25* 0.33 0.23*  CREATININE 0.33*  --  0.34*  --   --   --   < > = values in this interval not displayed.  Estimated Creatinine Clearance: 76.8 mL/min (by C-G formula based on Cr of 0.34).   Medications:  Scheduled:  . calcium-vitamin D  1 tablet Oral Daily  . clonazePAM  0.5 mg Oral BID  . feeding supplement  1 Container Oral TID BM  . folic acid  1 mg Oral Daily  . heparin  600 Units Intravenous Once  . LORazepam  0-4 mg Intravenous 4 times per day  . LORazepam  0-4 mg Intravenous Q12H  . LORazepam  0-4 mg Oral 4 times per day  . LORazepam  0-4 mg Oral Q12H  . magnesium oxide  400 mg Oral BID  . multivitamin with minerals  1 tablet Oral Daily  . pantoprazole  40 mg Oral QAC breakfast  . thiamine  100 mg Oral Daily    Assessment: CrCl = 76.8 ml/min  Goal  of Therapy:  Heparin level 0.3-0.7 units/ml Monitor platelets by anticoagulation protocol: Yes   Plan:  Heparin level subtherapeutic. 600 units IV x 1 bolus and increase rate to 950 units/hr. Will recheck level in 6 hours.  Laural Benes, Pharm.D., BCPS Clinical Pharmacist 10/15/2015,10:48 PM

## 2015-10-15 NOTE — Progress Notes (Signed)
ANTICOAGULATION CONSULT NOTE - Follow Up Consult  Pharmacy Consult for Heparin  Indication: Portal Vein Thrombosis  Allergies  Allergen Reactions  . Aspirin Shortness Of Breath  . Effexor [Venlafaxine] Anaphylaxis  . Sulfa Antibiotics Anaphylaxis  . Tetracyclines & Related Anaphylaxis  . Trazodone And Nefazodone Anaphylaxis  . Latex Hives and Itching  . Paxil [Paroxetine] Hives and Itching  . Seroquel [Quetiapine Fumarate] Hives and Itching  . Zoloft [Sertraline Hcl] Hives and Itching    Patient Measurements: Height: 5' 6"  (167.6 cm) Weight: 104 lb 4.8 oz (47.31 kg) IBW/kg (Calculated) : 59.3 Heparin Dosing Weight: 42.6 kg   Vital Signs: Temp: 98.7 F (37.1 C) (02/18 1244) Temp Source: Oral (02/18 1244) BP: 101/56 mmHg (02/18 1244) Pulse Rate: 93 (02/18 1244)  Labs:  Recent Labs  10/14/15 1820 10/15/15 0103 10/15/15 0739 10/15/15 1517  HGB 11.0* 10.3*  --   --   HCT 31.5* 29.5*  --   --   PLT 143* 131*  --   --   APTT 36  --   --   --   LABPROT 16.8*  --   --   --   INR 1.35  --   --   --   HEPARINUNFRC  --  0.16* 0.25* 0.33  CREATININE 0.33* 0.34*  --   --     Estimated Creatinine Clearance: 76.8 mL/min (by C-G formula based on Cr of 0.34).   Medications:  Scheduled:  . calcium-vitamin D  1 tablet Oral Daily  . clonazePAM  0.5 mg Oral BID  . feeding supplement  1 Container Oral TID BM  . folic acid  1 mg Oral Daily  . LORazepam  0-4 mg Intravenous 4 times per day  . LORazepam  0-4 mg Intravenous Q12H  . LORazepam  0-4 mg Oral 4 times per day  . LORazepam  0-4 mg Oral Q12H  . magnesium oxide  400 mg Oral BID  . multivitamin with minerals  1 tablet Oral Daily  . pantoprazole  40 mg Oral QAC breakfast  . thiamine  100 mg Oral Daily    Assessment: CrCl = 76.8 ml/min  Goal of Therapy:  Heparin level 0.3-0.7 units/ml Monitor platelets by anticoagulation protocol: Yes   Plan:  2/18 at 07:39 Anti-Xa = 0.25. Ordered bolus of 600 units. Increased  drip rate to 900 units/hr.  Will recheck heparin level in 6 hours on 2/18 at 15:15.   2/18 :   HL @ 15:15 = 0.33 Will continue this pt on current rate and recheck HL on 2/18 @ 21:30.   Trice Aspinall D 10/15/2015,4:33 PM

## 2015-10-15 NOTE — Consult Note (Signed)
ANTICOAGULATION CONSULT NOTE - Initial Consult  Pharmacy Consult for Heparin Indication: Portal Vein Thrombosis  Allergies  Allergen Reactions  . Aspirin Shortness Of Breath  . Effexor [Venlafaxine] Anaphylaxis  . Sulfa Antibiotics Anaphylaxis  . Tetracyclines & Related Anaphylaxis  . Trazodone And Nefazodone Anaphylaxis  . Latex Hives and Itching  . Paxil [Paroxetine] Hives and Itching  . Seroquel [Quetiapine Fumarate] Hives and Itching  . Zoloft [Sertraline Hcl] Hives and Itching    Patient Measurements: Height: 5' 6"  (167.6 cm) Weight: 94 lb (42.638 kg) IBW/kg (Calculated) : 59.3 Heparin Dosing Weight: 42.6 kg  Vital Signs: Temp: 98.3 F (36.8 C) (02/17 2007) Temp Source: Oral (02/17 2007) BP: 105/63 mmHg (02/17 2007) Pulse Rate: 87 (02/17 2007)  Labs:  Recent Labs  10/14/15 1820 10/15/15 0103  HGB 11.0* 10.3*  HCT 31.5* 29.5*  PLT 143* 131*  APTT 36  --   LABPROT 16.8*  --   INR 1.35  --   HEPARINUNFRC  --  0.16*  CREATININE 0.33*  --     Estimated Creatinine Clearance: 69.2 mL/min (by C-G formula based on Cr of 0.33).   Medical History: Past Medical History  Diagnosis Date  . Depression with anxiety initally at age 89  . Chlamydia 2007    bacterial vaginosis 09/2011  . Irregular heart beat 2010    wt/diet related after evaluation  . Renal disorder   . Pneumothorax, spontaneous, tension   . Alcohol abuse 11/2014    drinking since age 43. chronic, recurrent. at least 2 detox admits before 2015.   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   . Failure to thrive in adult 12/2014.     Malnutrition: n/v, not eating, weight loss, BMI 14.  s/p 01/11/2015 PEG (Dr Dorna Leitz).   . Alcoholic hepatitis 04/5620    hepatic steatosis on 11/2014 ultrasound.   . Seizure ([place]) 06/2015    due to ETOH/benzo withdrawal.   . Thrombocytopenia ([place]) 04/2007  . Colitis 12/2014    colonoscopy for diarrhea and abnml CT 01/06/15: erythmatous TI (path: active ileitis, ?  emerging IBD), rectal erythema (path: mucosal prolapse). Random bx of normal colon (path unremarkable)   . Spontaneous pneumothorax 08/2012    right.  chest tube placed.     Medications:  Prescriptions prior to admission  Medication Sig Dispense Refill Last Dose  . Calcium Carb-Cholecalciferol (CALCIUM 500 +D) 500-400 MG-UNIT TABS Take 1 tablet by mouth daily.   10/14/2015 at Unknown time  . clonazePAM (KLONOPIN) 0.5 MG tablet Take 0.5 mg by mouth 2 (two) times daily.  0 10/14/2015 at Unknown time  . folic acid (FOLVITE) 1 MG tablet Take 1 tablet (1 mg total) by mouth daily. 30 tablet 0 10/14/2015 at Unknown time  . Magnesium Oxide 400 (240 MG) MG TABS Take 400 mg by mouth 2 (two) times daily. 60 tablet 0 10/14/2015 at Unknown time  . Multiple Vitamin (MULTIVITAMIN WITH MINERALS) TABS tablet Take 1 tablet by mouth daily.   10/13/2015 at Unknown time  . omeprazole (PRILOSEC) 20 MG capsule Take 20 mg by mouth daily.   10/14/2015 at Unknown time  . ondansetron (ZOFRAN) 4 MG tablet Take 4 mg by mouth every 6 (six) hours as needed for nausea or vomiting.    Past Week at Unknown time  . Potassium 99 MG TABS Take 99 mg by mouth daily.   10/14/2015 at Unknown time  . thiamine 100 MG tablet Take 1 tablet (100 mg  total) by mouth daily. 30 tablet 0 10/14/2015 at Unknown time  . feeding supplement (BOOST / RESOURCE BREEZE) LIQD Take 1 Container by mouth 3 (three) times daily between meals. (Patient not taking: Reported on 10/14/2015)  0   . levofloxacin (LEVAQUIN) 750 MG tablet Take 1 tablet (750 mg total) by mouth daily. (Patient not taking: Reported on 10/14/2015) 5 tablet 0   . Water For Irrigation, Sterile (FREE WATER) SOLN Place 200 mLs into feeding tube every 8 (eight) hours. (Patient not taking: Reported on 06/21/2015) 1000 mL 5    Scheduled:  . calcium-vitamin D  1 tablet Oral Daily  . clonazePAM  0.5 mg Oral BID  . feeding supplement  1 Container Oral TID BM  . folic acid  1 mg Oral Daily  . heparin   1,300 Units Intravenous Once  . LORazepam  0-4 mg Intravenous 4 times per day  . LORazepam  0-4 mg Intravenous Q12H  . LORazepam  0-4 mg Oral 4 times per day  . LORazepam  0-4 mg Oral Q12H  . magnesium oxide  400 mg Oral BID  . multivitamin with minerals  1 tablet Oral Daily  . pantoprazole  40 mg Oral QAC breakfast  . thiamine  100 mg Oral Daily    Assessment: SJ is a 30yo female sent to the ED by GI for admission after patient found to have portal vein thrombosis and new-onset ascites 2/2 alcoholism. Pharmacy consulted to dose heparin in this patient.  Goal of Therapy:  Heparin level 0.3-0.7 units/ml Monitor platelets by anticoagulation protocol: Yes   Plan:  Heparin level subtherapeutic. 1300 units IV x 1 bolus and increase rate to 850 units/hr. Will recheck heparin level in 6 hours.  Laural Benes, Pharm.D., BCPS Clinical Pharmacist 10/15/2015,1:56 AM

## 2015-10-16 LAB — HEPARIN LEVEL (UNFRACTIONATED)
HEPARIN UNFRACTIONATED: 0.27 [IU]/mL — AB (ref 0.30–0.70)
Heparin Unfractionated: 0.4 IU/mL (ref 0.30–0.70)
Heparin Unfractionated: 0.38 IU/mL (ref 0.30–0.70)

## 2015-10-16 MED ORDER — HEPARIN BOLUS VIA INFUSION
600.0000 [IU] | Freq: Once | INTRAVENOUS | Status: AC
  Administered 2015-10-16: 600 [IU] via INTRAVENOUS
  Filled 2015-10-16: qty 600

## 2015-10-16 MED ORDER — WARFARIN SODIUM 1 MG PO TABS
7.5000 mg | ORAL_TABLET | Freq: Once | ORAL | Status: AC
  Administered 2015-10-16: 7.5 mg via ORAL
  Filled 2015-10-16: qty 8
  Filled 2015-10-16: qty 1

## 2015-10-16 MED ORDER — WARFARIN - PHARMACIST DOSING INPATIENT: Freq: Every day | Status: DC

## 2015-10-16 MED ORDER — WARFARIN SODIUM 1 MG PO TABS: 5.0000 mg | ORAL_TABLET | Freq: Every day | ORAL | Status: DC

## 2015-10-16 NOTE — Consult Note (Signed)
GI Inpatient Consult Note  Reason for Consult: Portal vein thrombosis. Pt of Dr. Rayann Heman.   Attending Requesting Consult:  History of Present Illness: Kristine Macias is a 30 y.o. female with multiple medical hx, incl alcoholic hepatitis, pancreatic divisum, who now has portal vein thrombosis based on liver doppler study. Still actively drinks. Malnourished. LFT elevated, pltc low, and INR rising. Placed on heparin drip on admission. No GI complaints right now.  Past Medical History:  Past Medical History  Diagnosis Date  . Depression with anxiety initally at age 36  . Chlamydia 2007    bacterial vaginosis 09/2011  . Irregular heart beat 2010    wt/diet related after evaluation  . Renal disorder   . Pneumothorax, spontaneous, tension   . Alcohol abuse 11/2014    drinking since age 55. chronic, recurrent. at least 2 detox admits before 2015.   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   . Failure to thrive in adult 12/2014.     Malnutrition: n/v, not eating, weight loss, BMI 14.  s/p 01/11/2015 PEG (Dr Dorna Leitz).   . Alcoholic hepatitis 01/1442    hepatic steatosis on 11/2014 ultrasound.   . Seizure ([place]) 06/2015    due to ETOH/benzo withdrawal.   . Thrombocytopenia ([place]) 04/2007  . Colitis 12/2014    colonoscopy for diarrhea and abnml CT 01/06/15: erythmatous TI (path: active ileitis, ? emerging IBD), rectal erythema (path: mucosal prolapse). Random bx of normal colon (path unremarkable)   . Spontaneous pneumothorax 08/2012    right.  chest tube placed.     Problem List: Patient Active Problem List   Diagnosis Date Noted  . 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 CAP (community acquired pneumonia) 10/05/2015  . Hepatitis 08/08/2015  . Nausea & vomiting 08/08/2015  . Alcoholic hepatitis without ascites   . Thrombocytopenia ([place]) 07/11/2015  . Hyponatremia 07/11/2015  . Alcoholic hepatitis with ascites   . Alcoholic hepatitis 15/40/0867  . Alcohol intoxication ([place]) 07/09/2015   . Withdrawal seizures ([place]) 07/01/2015  . 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 Drug withdrawal seizure ([place]) 06/21/2015  . Chest pain 05/20/2015  . GAD (generalized anxiety disorder) 04/17/2015  . Panic disorder 04/17/2015  . Alcohol withdrawal ([place]) 04/16/2015  . Abdominal pain, generalized 04/16/2015  . Vomiting and diarrhea   . Failure to thrive in adult   . Intractable nausea and vomiting 03/01/2015  . Hypokalemia 03/01/2015  . Protein-calorie malnutrition, severe ([place]) 01/05/2015  . Alcohol abuse   . Generalized anxiety disorder 11/26/2014    Class: Chronic  . Alcohol use disorder, severe, dependence ([place]) 11/25/2014  . Adnexal mass 04/10/2012    Past Surgical History: Past Surgical History  Procedure Laterality Date  . Cesarean section  04/2007, 07/2009     x 2.  G3, Para 2012 as of 07/2009.   Marland Kitchen Chest tube insertion    . Esophagogastroduodenoscopy N/A 01/06/2015    ;[facility], Dr Rayann Heman. For wt loss, N/V: Normal study, duodenal biopsy/pathology: chronic active duodenitis.   . Colonoscopy with propofol N/A 01/06/2015    [facility], Dr Rayann Heman. colonoscopy for diarrhea and abnml CT 01/06/15: erythmatous TI (path: active ileitis, ? emerging IBD), rectal erythema (path: mucosal prolapse). Random bx of normal colon (path unremarkable)   . Peg placement N/A 01/11/2015    [facility], Dr Rayann Heman.  for N/V/wt loss/severe malnutrition.   . Esophagogastroduodenoscopy N/A 01/11/2015    Rein-normal with PEG placement    Allergies: Allergies  Allergen Reactions  . Aspirin  Shortness Of Breath  . Effexor [Venlafaxine] Anaphylaxis  . Sulfa Antibiotics Anaphylaxis  . Tetracyclines & Related Anaphylaxis  . Trazodone And Nefazodone Anaphylaxis  . Latex Hives and Itching  . Paxil [Paroxetine] Hives and Itching  . Seroquel [Quetiapine Fumarate] Hives and Itching  . Zoloft [Sertraline Hcl] Hives and Itching    Home Medications: Prescriptions prior to admission  Medication Sig Dispense Refill Last Dose  . Calcium  Carb-Cholecalciferol (CALCIUM 500 +D) 500-400 MG-UNIT TABS Take 1 tablet by mouth daily.   10/14/2015 at Unknown time  . clonazePAM (KLONOPIN) 0.5 MG tablet Take 0.5 mg by mouth 2 (two) times daily.  0 10/14/2015 at Unknown time  . folic acid (FOLVITE) 1 MG tablet Take 1 tablet (1 mg total) by mouth daily. 30 tablet 0 10/14/2015 at Unknown time  . Magnesium Oxide 400 (240 MG) MG TABS Take 400 mg by mouth 2 (two) times daily. 60 tablet 0 10/14/2015 at Unknown time  . Multiple Vitamin (MULTIVITAMIN WITH MINERALS) TABS tablet Take 1 tablet by mouth daily.   10/13/2015 at Unknown time  . omeprazole (PRILOSEC) 20 MG capsule Take 20 mg by mouth daily.   10/14/2015 at Unknown time  . ondansetron (ZOFRAN) 4 MG tablet Take 4 mg by mouth every 6 (six) hours as needed for nausea or vomiting.    Past Week at Unknown time  . Potassium 99 MG TABS Take 99 mg by mouth daily.   10/14/2015 at Unknown time  . thiamine 100 MG tablet Take 1 tablet (100 mg total) by mouth daily. 30 tablet 0 10/14/2015 at Unknown time  . feeding supplement (BOOST / RESOURCE BREEZE) LIQD Take 1 Container by mouth 3 (three) times daily between meals. (Patient not taking: Reported on 10/14/2015)  0   . levofloxacin (LEVAQUIN) 750 MG tablet Take 1 tablet (750 mg total) by mouth daily. (Patient not taking: Reported on 10/14/2015) 5 tablet 0   . Water For Irrigation, Sterile (FREE WATER) SOLN Place 200 mLs into feeding tube every 8 (eight) hours. (Patient not taking: Reported on 06/21/2015) 1000 mL 5    Home medication reconciliation was completed with the patient.   Scheduled Inpatient Medications:   . calcium-vitamin D  1 tablet Oral Daily  . clonazePAM  0.5 mg Oral BID  . feeding supplement  1 Container Oral TID BM  . folic acid  1 mg Oral Daily  . LORazepam  0-4 mg Intravenous 4 times per day  . LORazepam  0-4 mg Intravenous Q12H  . LORazepam  0-4 mg Oral 4 times per day  . LORazepam  0-4 mg Oral Q12H  . magnesium oxide  400 mg Oral BID  .  multivitamin with minerals  1 tablet Oral Daily  . pantoprazole  40 mg Oral QAC breakfast  . thiamine  100 mg Oral Daily    Continuous Inpatient Infusions:   . heparin 1,000 Units/hr (10/16/15 1478)    PRN Inpatient Medications:  LORazepam **OR** LORazepam, ondansetron  Family History: family history includes Cancer in her father; Crohn's disease in her brother; Stroke in her other; Thyroid disease in her mother. There is no history of Anesthesia problems.  The patient's family history is negative for inflammatory bowel disorders, GI malignancy, or solid organ transplantation.  Social History:   reports that she has been smoking Cigarettes.  She has been smoking about 2.00 packs per day. She has never used smokeless tobacco. She reports that she drinks about 21.6 oz of alcohol per week. She reports  that she does not use illicit drugs. The patient denies ETOH, tobacco, or drug use.   Review of Systems: Constitutional: Weight is stable.  Eyes: No changes in vision. ENT: No oral lesions, sore throat.  GI: see HPI.  Heme/Lymph: No easy bruising.  CV: No chest pain.  GU: No hematuria.  Integumentary: No rashes.  Neuro: No headaches.  Psych: No depression/anxiety.  Endocrine: No heat/cold intolerance.  Allergic/Immunologic: No urticaria.  Resp: No cough, SOB.  Musculoskeletal: No joint swelling.    Physical Examination: BP 98/59 mmHg  Pulse 92  Temp(Src) 98.2 F (36.8 C) (Oral)  Resp 17  Ht 5' 6"  (1.676 m)  Wt 47.31 kg (104 lb 4.8 oz)  BMI 16.84 kg/m2  SpO2 98%  LMP 07/28/2015 (Approximate) Gen: NAD, alert and oriented x 4 HEENT: PEERLA, EOMI, Neck: supple, no JVD or thyromegaly Chest: CTA bilaterally, no wheezes, crackles, or other adventitious sounds CV: RRR, no m/g/c/r Abd: soft, NT, ND, +BS in all four quadrants; no HSM, guarding, ridigity, or rebound tenderness Ext: no edema, well perfused with 2+ pulses, Skin: no rash or lesions noted Lymph: no LAD  Data: Lab  Results  Component Value Date   WBC 10.2 10/15/2015   HGB 10.3* 10/15/2015   HCT 29.5* 10/15/2015   MCV 102.5* 10/15/2015   PLT 131* 10/15/2015    Recent Labs Lab 10/14/15 1820 10/15/15 0103  HGB 11.0* 10.3*   Lab Results  Component Value Date   NA 132* 10/15/2015   K 3.3* 10/15/2015   CL 94* 10/15/2015   CO2 32 10/15/2015   BUN 11 10/15/2015   CREATININE 0.34* 10/15/2015   Lab Results  Component Value Date   ALT 65* 10/15/2015   AST 351* 10/15/2015   ALKPHOS 296* 10/15/2015   BILITOT 2.5* 10/15/2015    Recent Labs Lab 10/14/15 1820  APTT 36  INR 1.35   Assessment/Plan: Ms. Pinkus is a 30 y.o. female with portal vein thrombosis, which likely related to her chronic liver disease. Though CT/US does not show cirrhosis, based on her labs, ascites, and portal v thrombosis, may have cirrhosis already or developing cirrhosis soon.  Recommendations: Continue heparin for now. Will need to switch to oral anticoagulation at some point. Hold off on diuretics for ascites since pt is so thin. Will notify Dr. Rayann Heman tomorrow and have him continue to follow pt.  Thank you for the consult. Please call with questions or concerns.  Dynastee Brummell, Lupita Dawn, MD

## 2015-10-16 NOTE — Progress Notes (Signed)
ANTICOAGULATION CONSULT NOTE - Initial Consult  Pharmacy Consult for Warfarin  Indication: Portal Vein Thrombosis  Allergies  Allergen Reactions  . Aspirin Shortness Of Breath  . Effexor [Venlafaxine] Anaphylaxis  . Sulfa Antibiotics Anaphylaxis  . Tetracyclines & Related Anaphylaxis  . Trazodone And Nefazodone Anaphylaxis  . Latex Hives and Itching  . Paxil [Paroxetine] Hives and Itching  . Seroquel [Quetiapine Fumarate] Hives and Itching  . Zoloft [Sertraline Hcl] Hives and Itching    Patient Measurements: Height: 5' 6"  (167.6 cm) Weight: 104 lb 4.8 oz (47.31 kg) IBW/kg (Calculated) : 59.3 Heparin Dosing Weight:   Vital Signs: Temp: 98.2 F (36.8 C) (02/19 0516) Temp Source: Oral (02/19 0516) BP: 98/59 mmHg (02/19 0516) Pulse Rate: 92 (02/19 0516)  Labs:  Recent Labs  10/14/15 1820 10/15/15 0103  10/15/15 2147 10/16/15 0524 10/16/15 1157  HGB 11.0* 10.3*  --   --   --   --   HCT 31.5* 29.5*  --   --   --   --   PLT 143* 131*  --   --   --   --   APTT 36  --   --   --   --   --   LABPROT 16.8*  --   --   --   --   --   INR 1.35  --   --   --   --   --   HEPARINUNFRC  --  0.16*  < > 0.23* 0.27* 0.40  CREATININE 0.33* 0.34*  --   --   --   --   < > = values in this interval not displayed.  Estimated Creatinine Clearance: 76.8 mL/min (by C-G formula based on Cr of 0.34).   Medical History: Past Medical History  Diagnosis Date  . Depression with anxiety initally at age 69  . Chlamydia 2007    bacterial vaginosis 09/2011  . Irregular heart beat 2010    wt/diet related after evaluation  . Renal disorder   . Pneumothorax, spontaneous, tension   . Alcohol abuse 11/2014    drinking since age 41. chronic, recurrent. at least 2 detox admits before 2015.   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   . Failure to thrive in adult 12/2014.     Malnutrition: n/v, not eating, weight loss, BMI 14.  s/p 01/11/2015 PEG (Dr Dorna Leitz).   . Alcoholic hepatitis 0/0762   hepatic steatosis on 11/2014 ultrasound.   . Seizure ([place]) 06/2015    due to ETOH/benzo withdrawal.   . Thrombocytopenia ([place]) 04/2007  . Colitis 12/2014    colonoscopy for diarrhea and abnml CT 01/06/15: erythmatous TI (path: active ileitis, ? emerging IBD), rectal erythema (path: mucosal prolapse). Random bx of normal colon (path unremarkable)   . Spontaneous pneumothorax 08/2012    right.  chest tube placed.     Medications:  Prescriptions prior to admission  Medication Sig Dispense Refill Last Dose  . Calcium Carb-Cholecalciferol (CALCIUM 500 +D) 500-400 MG-UNIT TABS Take 1 tablet by mouth daily.   10/14/2015 at Unknown time  . clonazePAM (KLONOPIN) 0.5 MG tablet Take 0.5 mg by mouth 2 (two) times daily.  0 10/14/2015 at Unknown time  . folic acid (FOLVITE) 1 MG tablet Take 1 tablet (1 mg total) by mouth daily. 30 tablet 0 10/14/2015 at Unknown time  . Magnesium Oxide 400 (240 MG) MG TABS Take 400 mg by mouth 2 (two) times daily. 60 tablet 0  10/14/2015 at Unknown time  . Multiple Vitamin (MULTIVITAMIN WITH MINERALS) TABS tablet Take 1 tablet by mouth daily.   10/13/2015 at Unknown time  . omeprazole (PRILOSEC) 20 MG capsule Take 20 mg by mouth daily.   10/14/2015 at Unknown time  . ondansetron (ZOFRAN) 4 MG tablet Take 4 mg by mouth every 6 (six) hours as needed for nausea or vomiting.    Past Week at Unknown time  . Potassium 99 MG TABS Take 99 mg by mouth daily.   10/14/2015 at Unknown time  . thiamine 100 MG tablet Take 1 tablet (100 mg total) by mouth daily. 30 tablet 0 10/14/2015 at Unknown time  . feeding supplement (BOOST / RESOURCE BREEZE) LIQD Take 1 Container by mouth 3 (three) times daily between meals. (Patient not taking: Reported on 10/14/2015)  0   . levofloxacin (LEVAQUIN) 750 MG tablet Take 1 tablet (750 mg total) by mouth daily. (Patient not taking: Reported on 10/14/2015) 5 tablet 0   . Water For Irrigation, Sterile (FREE WATER) SOLN Place 200 mLs into feeding tube every 8 (eight)  hours. (Patient not taking: Reported on 06/21/2015) 1000 mL 5     Assessment: Pt already on Heparin for Portal Vein Thrombosis .   Goal of Therapy:  INR 2-3   Plan:  Warfarin 7.5 mg PO X 1 to be given 2/19 @ 18:00 followed by warfarin 5 mg PO daily to start 2/20 @ 18:00. INR ordered for 2/20 @ 5:00. Will continue pt on heparin until INR is therapeutic.   Che Below D 10/16/2015,12:58 PM

## 2015-10-16 NOTE — Progress Notes (Signed)
ANTICOAGULATION CONSULT NOTE - Follow Up Consult  Pharmacy Consult for Heparin  Indication: Portal Vein Thrombosis  Allergies  Allergen Reactions  . Aspirin Shortness Of Breath  . Effexor [Venlafaxine] Anaphylaxis  . Sulfa Antibiotics Anaphylaxis  . Tetracyclines & Related Anaphylaxis  . Trazodone And Nefazodone Anaphylaxis  . Latex Hives and Itching  . Paxil [Paroxetine] Hives and Itching  . Seroquel [Quetiapine Fumarate] Hives and Itching  . Zoloft [Sertraline Hcl] Hives and Itching    Patient Measurements: Height: 5' 6"  (167.6 cm) Weight: 104 lb 4.8 oz (47.31 kg) IBW/kg (Calculated) : 59.3 Heparin Dosing Weight: 42.6 kg   Vital Signs: Temp: 98.2 F (36.8 C) (02/19 0516) Temp Source: Oral (02/19 0516) BP: 98/59 mmHg (02/19 0516) Pulse Rate: 92 (02/19 0516)  Labs:  Recent Labs  10/14/15 1820 10/15/15 0103  10/15/15 1517 10/15/15 2147 10/16/15 0524  HGB 11.0* 10.3*  --   --   --   --   HCT 31.5* 29.5*  --   --   --   --   PLT 143* 131*  --   --   --   --   APTT 36  --   --   --   --   --   LABPROT 16.8*  --   --   --   --   --   INR 1.35  --   --   --   --   --   HEPARINUNFRC  --  0.16*  < > 0.33 0.23* 0.27*  CREATININE 0.33* 0.34*  --   --   --   --   < > = values in this interval not displayed.  Estimated Creatinine Clearance: 76.8 mL/min (by C-G formula based on Cr of 0.34).   Medications:  Scheduled:  . calcium-vitamin D  1 tablet Oral Daily  . clonazePAM  0.5 mg Oral BID  . feeding supplement  1 Container Oral TID BM  . folic acid  1 mg Oral Daily  . heparin  600 Units Intravenous Once  . LORazepam  0-4 mg Intravenous 4 times per day  . LORazepam  0-4 mg Intravenous Q12H  . LORazepam  0-4 mg Oral 4 times per day  . LORazepam  0-4 mg Oral Q12H  . magnesium oxide  400 mg Oral BID  . multivitamin with minerals  1 tablet Oral Daily  . pantoprazole  40 mg Oral QAC breakfast  . thiamine  100 mg Oral Daily    Assessment: CrCl = 76.8 ml/min  Goal  of Therapy:  Heparin level 0.3-0.7 units/ml Monitor platelets by anticoagulation protocol: Yes   Plan:  Heparin level subtherapeutic. 600 units IV x 1 bolus and increase rate to 1000 units/hr. Will recheck level in 6 hours.  Laural Benes, Pharm.D., BCPS Clinical Pharmacist 10/16/2015,5:56 AM

## 2015-10-16 NOTE — Progress Notes (Signed)
ANTICOAGULATION CONSULT NOTE - Follow Up Consult  Pharmacy Consult for Heparin  Indication: Portal Vein Thrombosis  Allergies  Allergen Reactions  . Aspirin Shortness Of Breath  . Effexor [Venlafaxine] Anaphylaxis  . Sulfa Antibiotics Anaphylaxis  . Tetracyclines & Related Anaphylaxis  . Trazodone And Nefazodone Anaphylaxis  . Latex Hives and Itching  . Paxil [Paroxetine] Hives and Itching  . Seroquel [Quetiapine Fumarate] Hives and Itching  . Zoloft [Sertraline Hcl] Hives and Itching    Patient Measurements: Height: 5' 6"  (167.6 cm) Weight: 104 lb 4.8 oz (47.31 kg) IBW/kg (Calculated) : 59.3 Heparin Dosing Weight: 42.6 kg   Vital Signs: Temp: 98.9 F (37.2 C) (02/19 1344) Temp Source: Oral (02/19 1344) BP: 95/64 mmHg (02/19 1344) Pulse Rate: 101 (02/19 1344)  Labs:  Recent Labs  10/14/15 1820 10/15/15 0103  10/16/15 0524 10/16/15 1157 10/16/15 1744  HGB 11.0* 10.3*  --   --   --   --   HCT 31.5* 29.5*  --   --   --   --   PLT 143* 131*  --   --   --   --   APTT 36  --   --   --   --   --   LABPROT 16.8*  --   --   --   --   --   INR 1.35  --   --   --   --   --   HEPARINUNFRC  --  0.16*  < > 0.27* 0.40 0.38  CREATININE 0.33* 0.34*  --   --   --   --   < > = values in this interval not displayed.  Estimated Creatinine Clearance: 76.8 mL/min (by C-G formula based on Cr of 0.34).   Medications:  Scheduled:  . calcium-vitamin D  1 tablet Oral Daily  . clonazePAM  0.5 mg Oral BID  . feeding supplement  1 Container Oral TID BM  . folic acid  1 mg Oral Daily  . LORazepam  0-4 mg Oral Q12H  . magnesium oxide  400 mg Oral BID  . multivitamin with minerals  1 tablet Oral Daily  . pantoprazole  40 mg Oral QAC breakfast  . thiamine  100 mg Oral Daily  . [START ON 10/17/2015] warfarin  5 mg Oral q1800  . warfarin  7.5 mg Oral ONCE-1800  . Warfarin - Pharmacist Dosing Inpatient   Does not apply q1800    Assessment: CrCl = 76.8 ml/min  Goal of Therapy:   Heparin level 0.3-0.7 units/ml Monitor platelets by anticoagulation protocol: Yes   Plan:  Heparin level subtherapeutic. 600 units IV x 1 bolus and increase rate to 1000 units/hr. Will recheck level in 6 hours.  2/19 ~ 12:00 HL = 0.4.  Will continue current drip rate.   Next HL ordered for 2/19 at 18:00.  2/19:  HL @ 17:44 = 0.38 Will continue this pt on current drip rate on 1000 units/hr and recheck HL on 2/20 with AM labs.   [place] Clinical Pharmacist 10/16/2015,6:19 PM

## 2015-10-16 NOTE — Progress Notes (Signed)
ANTICOAGULATION CONSULT NOTE - Follow Up Consult  Pharmacy Consult for Heparin  Indication: Portal Vein Thrombosis  Allergies  Allergen Reactions  . Aspirin Shortness Of Breath  . Effexor [Venlafaxine] Anaphylaxis  . Sulfa Antibiotics Anaphylaxis  . Tetracyclines & Related Anaphylaxis  . Trazodone And Nefazodone Anaphylaxis  . Latex Hives and Itching  . Paxil [Paroxetine] Hives and Itching  . Seroquel [Quetiapine Fumarate] Hives and Itching  . Zoloft [Sertraline Hcl] Hives and Itching    Patient Measurements: Height: 5' 6"  (167.6 cm) Weight: 104 lb 4.8 oz (47.31 kg) IBW/kg (Calculated) : 59.3 Heparin Dosing Weight: 42.6 kg   Vital Signs: Temp: 98.9 F (37.2 C) (02/19 1344) Temp Source: Oral (02/19 1344) BP: 95/64 mmHg (02/19 1344) Pulse Rate: 101 (02/19 1344)  Labs:  Recent Labs  10/14/15 1820 10/15/15 0103  10/15/15 2147 10/16/15 0524 10/16/15 1157  HGB 11.0* 10.3*  --   --   --   --   HCT 31.5* 29.5*  --   --   --   --   PLT 143* 131*  --   --   --   --   APTT 36  --   --   --   --   --   LABPROT 16.8*  --   --   --   --   --   INR 1.35  --   --   --   --   --   HEPARINUNFRC  --  0.16*  < > 0.23* 0.27* 0.40  CREATININE 0.33* 0.34*  --   --   --   --   < > = values in this interval not displayed.  Estimated Creatinine Clearance: 76.8 mL/min (by C-G formula based on Cr of 0.34).   Medications:  Scheduled:  . calcium-vitamin D  1 tablet Oral Daily  . clonazePAM  0.5 mg Oral BID  . feeding supplement  1 Container Oral TID BM  . folic acid  1 mg Oral Daily  . LORazepam  0-4 mg Intravenous 4 times per day  . LORazepam  0-4 mg Intravenous Q12H  . LORazepam  0-4 mg Oral 4 times per day  . LORazepam  0-4 mg Oral Q12H  . magnesium oxide  400 mg Oral BID  . multivitamin with minerals  1 tablet Oral Daily  . pantoprazole  40 mg Oral QAC breakfast  . thiamine  100 mg Oral Daily  . [START ON 10/17/2015] warfarin  5 mg Oral q1800  . warfarin  7.5 mg Oral  ONCE-1800  . Warfarin - Pharmacist Dosing Inpatient   Does not apply q1800    Assessment: CrCl = 76.8 ml/min  Goal of Therapy:  Heparin level 0.3-0.7 units/ml Monitor platelets by anticoagulation protocol: Yes   Plan:  Heparin level subtherapeutic. 600 units IV x 1 bolus and increase rate to 1000 units/hr. Will recheck level in 6 hours.  2/19 ~ 12:00 HL = 0.4.  Will continue current drip rate.   Next HL ordered for 2/19 at 18:00.  [place] Clinical Pharmacist 10/16/2015,2:00 PM

## 2015-10-16 NOTE — Progress Notes (Signed)
On call doctor contacted in regard to lack of urine output throughout the shift. Pt had been bladder scanned and shown 525 mL. Doctor gave order for in and out catheter for inability to void. Pt then reported that she wanted to try and was able to produce 650 mL of urine.

## 2015-10-17 LAB — CBC
HEMATOCRIT: 26.8 % — AB (ref 35.0–47.0)
HEMOGLOBIN: 8.7 g/dL — AB (ref 12.0–16.0)
MCH: 35.4 pg — ABNORMAL HIGH (ref 26.0–34.0)
MCHC: 32.4 g/dL (ref 32.0–36.0)
MCV: 109.3 fL — ABNORMAL HIGH (ref 80.0–100.0)
Platelets: 89 10*3/uL — ABNORMAL LOW (ref 150–440)
RBC: 2.46 MIL/uL — AB (ref 3.80–5.20)
RDW: 18.5 % — ABNORMAL HIGH (ref 11.5–14.5)
WBC: 14 10*3/uL — ABNORMAL HIGH (ref 3.6–11.0)

## 2015-10-17 LAB — COMPREHENSIVE METABOLIC PANEL
ALT: 45 U/L (ref 14–54)
ANION GAP: 3 — AB (ref 5–15)
AST: 204 U/L — ABNORMAL HIGH (ref 15–41)
Albumin: 1.8 g/dL — ABNORMAL LOW (ref 3.5–5.0)
Alkaline Phosphatase: 289 U/L — ABNORMAL HIGH (ref 38–126)
BUN: 8 mg/dL (ref 6–20)
CHLORIDE: 102 mmol/L (ref 101–111)
CO2: 30 mmol/L (ref 22–32)
Calcium: 7.4 mg/dL — ABNORMAL LOW (ref 8.9–10.3)
Creatinine, Ser: 0.44 mg/dL (ref 0.44–1.00)
GFR calc non Af Amer: >60 mL/min (ref >60)
Glucose, Bld: 114 mg/dL — ABNORMAL HIGH (ref 65–99)
Potassium: 3.5 mmol/L (ref 3.5–5.1)
SODIUM: 135 mmol/L (ref 135–145)
Total Bilirubin: 2.7 mg/dL — ABNORMAL HIGH (ref 0.3–1.2)
Total Protein: 5.2 g/dL — ABNORMAL LOW (ref 6.5–8.1)

## 2015-10-17 LAB — HEPARIN LEVEL (UNFRACTIONATED)
HEPARIN UNFRACTIONATED: 0.29 [IU]/mL — AB (ref 0.30–0.70)
Heparin Unfractionated: 0.32 IU/mL (ref 0.30–0.70)

## 2015-10-17 LAB — PROTIME-INR
INR: 1.39
PROTHROMBIN TIME: 17.2 s — AB (ref 11.4–15.0)

## 2015-10-17 MED ORDER — LORAZEPAM 2 MG/ML IJ SOLN
1.0000 mg | Freq: Four times a day (QID) | INTRAMUSCULAR | Status: AC | PRN
  Administered 2015-10-17 – 2015-10-19 (×2): 1 mg via INTRAVENOUS
  Filled 2015-10-17 (×3): qty 1

## 2015-10-17 MED ORDER — HEPARIN BOLUS VIA INFUSION
650.0000 [IU] | Freq: Once | INTRAVENOUS | Status: AC
  Administered 2015-10-17: 650 [IU] via INTRAVENOUS
  Filled 2015-10-17: qty 650

## 2015-10-17 MED ORDER — LORAZEPAM 1 MG PO TABS
1.0000 mg | ORAL_TABLET | Freq: Four times a day (QID) | ORAL | Status: AC | PRN
  Administered 2015-10-19 – 2015-10-20 (×2): 1 mg via ORAL
  Filled 2015-10-17 (×3): qty 1

## 2015-10-17 MED ORDER — ONDANSETRON HCL 4 MG/2ML IJ SOLN
4.0000 mg | Freq: Four times a day (QID) | INTRAMUSCULAR | Status: DC
  Administered 2015-10-17 – 2015-10-22 (×18): 4 mg via INTRAVENOUS
  Filled 2015-10-17 (×19): qty 2

## 2015-10-17 NOTE — Progress Notes (Signed)
Per Dr. Anselm Jungling okay to discontinue order for telemetry. Pt does not have telemetry box on at this time.

## 2015-10-17 NOTE — Progress Notes (Signed)
GI Inpatient Follow-up Note  Patient Identification: Kristine Macias is a 30 y.o. female with decompensated ETOH liver disease and severe malnutrition.   Subjective:  + Abd swelling.  + Jaundice.   Denies n/v, diarrhea, rectal bleeding, melena.   Scheduled Inpatient Medications:  . calcium-vitamin D  1 tablet Oral Daily  . clonazePAM  0.5 mg Oral BID  . feeding supplement  1 Container Oral TID BM  . folic acid  1 mg Oral Daily  . LORazepam  0-4 mg Oral Q12H  . magnesium oxide  400 mg Oral BID  . multivitamin with minerals  1 tablet Oral Daily  . ondansetron (ZOFRAN) IV  4 mg Intravenous 4 times per day  . pantoprazole  40 mg Oral QAC breakfast  . thiamine  100 mg Oral Daily    Continuous Inpatient Infusions:     PRN Inpatient Medications:  LORazepam **OR** LORazepam, ondansetron  Review of Systems:  Eyes: No changes in vision. ENT: No oral lesions, sore throat.  GI: see HPI.  Heme/Lymph: + easy bruising.  CV: No chest pain.  GU: No hematuria.  Integumentary: + rashes.  Neuro: No headaches.  Psych: + epression/anxiety.  Endocrine: No heat/cold intolerance.  Allergic/Immunologic: No urticaria.  Resp: No cough, SOB.  Musculoskeletal: No joint swelling.    Physical Examination: BP 110/70 mmHg  Pulse 94  Temp(Src) 99.1 F (37.3 C) (Oral)  Resp 20  Ht 5' 6"  (1.676 m)  Wt 47.31 kg (104 lb 4.8 oz)  BMI 16.84 kg/m2  SpO2 95%  LMP 07/28/2015 (Approximate) Gen: NAD, alert and oriented x 4, severely underweight, + jaundiced Neck: supple, no JVD or thyromegaly Chest: CTA bilaterally, no wheezes, crackles, or other adventitious sounds CV: RRR, no m/g/c/r Abd: + mild distension, + PEG tube C/d/i,  nt,nabs Ext: no edema, well perfused with 2+ pulses, Skin: no rash or lesions noted Lymph: no LAD  Data: Lab Results  Component Value Date   WBC 14.0* 10/17/2015   HGB 8.7* 10/17/2015   HCT 26.8* 10/17/2015   MCV 109.3* 10/17/2015   PLT 89* 10/17/2015    Recent  Labs Lab 10/14/15 1820 10/15/15 0103 10/17/15 0541  HGB 11.0* 10.3* 8.7*   Lab Results  Component Value Date   NA 135 10/17/2015   K 3.5 10/17/2015   CL 102 10/17/2015   CO2 30 10/17/2015   BUN 8 10/17/2015   CREATININE 0.44 10/17/2015   Lab Results  Component Value Date   ALT 45 10/17/2015   AST 204* 10/17/2015   ALKPHOS 289* 10/17/2015   BILITOT 2.7* 10/17/2015    Recent Labs Lab 10/14/15 1820 10/17/15 0541  APTT 36  --   INR 1.35 1.39   Assessment/Plan: Ms. Jiggetts is a 30 y.o. female with decompensated ETOH liver disease, severe malnutrition.   I had a discussion with Ms Thalmann about the current status of her liver.  She now has portal HTN, jaundice, ascites, varices and is heading towards death from decompensated liver disease.   If she stops drinking ETOH, her liver may or many not improve.  I discussed all this with her.    I  discussed the PVT on u/s but not MRI with radiologist. The MRI is much better test for PVT so she actually does not have PVT based on the normal MRI.   Recommendations: - d/c heparin and coumadin since she does not have PVT based on MRI - psychiatry, palliative care, s/w consults since she is 47 and now heading  toward death from decompensated ETOH liver disease. - low Na diet - diagnostic only para if develops abd pain or fever - nutrition consult - She has a very high mortality risk given decompensated liver disease and very severe malnutrition.   Please call with questions or concerns.  REIN, Grace Blight, MD

## 2015-10-17 NOTE — Progress Notes (Signed)
ANTICOAGULATION CONSULT NOTE - Follow Up Consult  Pharmacy Consult for Heparin  Indication: Portal Vein Thrombosis  Allergies  Allergen Reactions  . Aspirin Shortness Of Breath  . Effexor [Venlafaxine] Anaphylaxis  . Sulfa Antibiotics Anaphylaxis  . Tetracyclines & Related Anaphylaxis  . Trazodone And Nefazodone Anaphylaxis  . Latex Hives and Itching  . Paxil [Paroxetine] Hives and Itching  . Seroquel [Quetiapine Fumarate] Hives and Itching  . Zoloft [Sertraline Hcl] Hives and Itching    Patient Measurements: Height: 5' 6"  (167.6 cm) Weight: 104 lb 4.8 oz (47.31 kg) IBW/kg (Calculated) : 59.3 Heparin Dosing Weight: 42.6 kg   Vital Signs: Temp: 99.8 F (37.7 C) (02/20 0427) Temp Source: Oral (02/20 0427) BP: 98/58 mmHg (02/20 0427) Pulse Rate: 106 (02/20 0427)  Labs:  Recent Labs  10/14/15 1820 10/15/15 0103  10/16/15 1157 10/16/15 1744 10/17/15 0541 10/17/15 0622  HGB 11.0* 10.3*  --   --   --  8.7*  --   HCT 31.5* 29.5*  --   --   --  26.8*  --   PLT 143* 131*  --   --   --  89*  --   APTT 36  --   --   --   --   --   --   LABPROT 16.8*  --   --   --   --  17.2*  --   INR 1.35  --   --   --   --  1.39  --   HEPARINUNFRC  --  0.16*  < > 0.40 0.38  --  0.29*  CREATININE 0.33* 0.34*  --   --   --  0.44  --   < > = values in this interval not displayed.  Estimated Creatinine Clearance: 76.8 mL/min (by C-G formula based on Cr of 0.44).   Medications:  Scheduled:  . calcium-vitamin D  1 tablet Oral Daily  . clonazePAM  0.5 mg Oral BID  . feeding supplement  1 Container Oral TID BM  . folic acid  1 mg Oral Daily  . LORazepam  0-4 mg Oral Q12H  . magnesium oxide  400 mg Oral BID  . multivitamin with minerals  1 tablet Oral Daily  . ondansetron (ZOFRAN) IV  4 mg Intravenous 4 times per day  . pantoprazole  40 mg Oral QAC breakfast  . thiamine  100 mg Oral Daily  . warfarin  5 mg Oral q1800  . Warfarin - Pharmacist Dosing Inpatient   Does not apply q1800     Assessment: CrCl = 76.8 ml/min  Goal of Therapy:  Heparin level 0.3-0.7 units/ml Monitor platelets by anticoagulation protocol: Yes   Plan:  Heparin level subtherapeutic. 600 units IV x 1 bolus and increase rate to 1000 units/hr. Will recheck level in 6 hours.  2/19 ~ 12:00 HL = 0.4.  Will continue current drip rate.   Next HL ordered for 2/19 at 18:00.  2/19:  HL @ 17:44 = 0.38 Will continue this pt on current drip rate on 1000 units/hr and recheck HL on 2/20 with AM labs.   2/20: HL @ 0622= 0.29. Subtherapeutic.  Will give bolus of 650 units and increase rate to 1100 units/hr. Recheck HL on 2/20 6 hours after bolus (1430).  [name], [place] Clinical Pharmacist 10/17/2015,8:12 AM

## 2015-10-17 NOTE — Progress Notes (Signed)
[facility] [place] NAME: Dua Mehler    MR#:  [id number]  DATE OF BIRTH:  10-May-1986  SUBJECTIVE:  CHIEF COMPLAINT:   Chief Complaint  Patient presents with  . Ascites   sent from office by gastroenterologist for finding of portal vein thrombosis.  Patient does complain of slight abdominal pain and distention, currently nothing by mouth waiting for MRCP, on heparin IV drip.  REVIEW OF SYSTEMS:  CONSTITUTIONAL: No fever, fatigue or weakness.  EYES: No blurred or double vision.  EARS, NOSE, AND THROAT: No tinnitus or ear pain.  RESPIRATORY: No cough, shortness of breath, wheezing or hemoptysis.  CARDIOVASCULAR: No chest pain, orthopnea, edema.  GASTROINTESTINAL: No nausea, vomiting, diarrhea, positive for abdominal pain.  GENITOURINARY: No dysuria, hematuria.  ENDOCRINE: No polyuria, nocturia,  HEMATOLOGY: No anemia, easy bruising or bleeding SKIN: No rash or lesion. MUSCULOSKELETAL: No joint pain or arthritis.   NEUROLOGIC: No tingling, numbness, weakness.  PSYCHIATRY: No anxiety or depression.   ROS  DRUG ALLERGIES:   Allergies  Allergen Reactions  . Aspirin Shortness Of Breath  . Effexor [Venlafaxine] Anaphylaxis  . Sulfa Antibiotics Anaphylaxis  . Tetracyclines & Related Anaphylaxis  . Trazodone And Nefazodone Anaphylaxis  . Latex Hives and Itching  . Paxil [Paroxetine] Hives and Itching  . Seroquel [Quetiapine Fumarate] Hives and Itching  . Zoloft [Sertraline Hcl] Hives and Itching    VITALS:  Blood pressure 98/58, pulse 106, temperature 99.8 F (37.7 C), temperature source Oral, resp. rate 18, height 5' 6"  (1.676 m), weight 47.31 kg (104 lb 4.8 oz), last menstrual period 07/28/2015, SpO2 91 %.  PHYSICAL EXAMINATION:  GENERAL:  30 y.o.-year-old thin patient lying in the bed with no acute distress.  EYES: Pupils equal, round, reactive to light and accommodation. No scleral icterus. Extraocular muscles  intact.  HEENT: Head atraumatic, normocephalic. Oropharynx and nasopharynx clear.  NECK:  Supple, no jugular venous distention. No thyroid enlargement, no tenderness.  LUNGS: Normal breath sounds bilaterally, no wheezing, rales,rhonchi or crepitation. No use of accessory muscles of respiration.  CARDIOVASCULAR: S1, S2 normal. No murmurs, rubs, or gallops.  ABDOMEN: Soft, mild tender, mild distended. Bowel sounds present. No organomegaly or mass. Left upper quadrant PEG tube present. EXTREMITIES: No pedal edema, cyanosis, or clubbing.  NEUROLOGIC: Cranial nerves II through XII are intact. Muscle strength 5/5 in all extremities. Sensation intact. Gait not checked. No signs of agitation or shaking. PSYCHIATRIC: The patient is alert and oriented x 3.  SKIN: No obvious rash, lesion, or ulcer.   Physical Exam LABORATORY PANEL:   CBC  Recent Labs Lab 10/17/15 0541  WBC 14.0*  HGB 8.7*  HCT 26.8*  PLT 89*   ------------------------------------------------------------------------------------------------------------------  Chemistries   Recent Labs Lab 10/17/15 0541  NA 135  K 3.5  CL 102  CO2 30  GLUCOSE 114*  BUN 8  CREATININE 0.44  CALCIUM 7.4*  AST 204*  ALT 45  ALKPHOS 289*  BILITOT 2.7*   ------------------------------------------------------------------------------------------------------------------  Cardiac Enzymes No results for input(s): TROPONINI in the last 168 hours. ------------------------------------------------------------------------------------------------------------------  RADIOLOGY:  Mr Lambert Mody Cm/mrcp  10/15/2015  CLINICAL DATA:  30 year old female inpatient with abdominal pain and swelling. Question of main portal vein nonocclusive thrombosis on Doppler sonography from 1 day prior. EXAM: MRI ABDOMEN WITHOUT AND WITH CONTRAST (INCLUDING MRCP) TECHNIQUE: Multiplanar multisequence MR imaging of the abdomen was performed both before and after the  administration of intravenous contrast. Heavily T2-weighted images of the biliary  and pancreatic ducts were obtained, and three-dimensional MRCP images were rendered by post processing. CONTRAST:  48m MULTIHANCE GADOBENATE DIMEGLUMINE 529 MG/ML IV SOLN COMPARISON:  10/14/2015 right upper quadrant and abdominal Doppler sonograms. 08/08/2015 CT abdomen/pelvis. FINDINGS: Lower chest: Small bilateral pleural effusions, left greater than right, new since 08/08/2015 CT. Associated compressive atelectasis in the left greater than right lower lobes. Hepatobiliary: New mild hepatomegaly (craniocaudal right liver lobe length 19.5 cm, increased from 17.4 cm on 08/08/2015). No definite liver surface irregularity. Moderate to severe diffuse hepatic steatosis. There is a subcentimeter simple liver cyst in the lateral segment left liver lobe. Otherwise no liver masses. No cholelithiasis. Mild diffuse gallbladder wall thickening. Mild gallbladder distention. Mild diffuse periportal edema. No intrahepatic biliary ductal dilatation. Minimally prominent common bile duct measuring 6 mm in diameter with smooth distal tapering. No choledocholithiasis. No enhancing biliary or ampullary mass. Pancreas: No pancreatic mass or duct dilation. Normal pancreatic parenchymal signal intensity and enhancement. No pancreas divisum. Spleen: Normal size. No mass. Adrenals/Urinary Tract: Normal adrenals. No hydronephrosis. Normal kidneys with no renal mass. Stomach/Bowel: Percutaneous button gastrostomy appears well positioned in the body of the stomach. Stomach is collapsed and grossly normal. Visualized small and large bowel is normal caliber, with no bowel wall thickening. Vascular/Lymphatic: Normal caliber abdominal aorta. Patent portal, splenic, hepatic and renal veins. Small paraumbilical varix appears new. Probable small gastroesophageal varices. No evidence of thrombosis in the portal veins. No pathologically enlarged lymph nodes in the  abdomen. Other: There is small to moderate volume ascites, which is new since 08/08/2015. Musculoskeletal: No aggressive appearing focal osseous lesions. IMPRESSION: 1. New mild hepatomegaly. Moderate to severe diffuse hepatic steatosis. No macroscopic evidence of cirrhosis. Tiny simple cyst in the left lower lobe. No suspicious liver masses. 2. New findings of portal hypertension including small to moderate volume ascites and small paraumbilical and gastroesophageal varices. Patent portal, splenic and hepatic veins. No evidence of venous thrombosis. 3. Mild diffuse gallbladder wall thickening without cholelithiasis, probably representing noninflammatory edema due to portal hypertension. 4. Minimally prominent common bile duct. No intrahepatic biliary ductal dilatation. No choledocholithiasis. No biliary or ampullary mass. 5. Small left greater than right bilateral pleural effusions, new. Electronically Signed   By: JIlona SorrelM.D.   On: 10/15/2015 19:20    ASSESSMENT AND PLAN:   Principal Problem:   Portal vein thrombosis Active Problems:   Abdominal pain, generalized   Alcoholic hepatitis with ascites   * Portal Vein thrombosis IV heparin drip. awaited  GI consult.   Will have to start on coumadin.  * Alcoholic hepatitis and ascites GI consult for further management.   MRCP today.  * Chronic alcoholism Last drink was before admission, she had alcohol withdrawal and seizures in the past keep monitoring for withdrawal signed.  * Chronic malnutrition This is secondary to chronic alcoholism, continue dietary supplemented.  * Smoking Counseled to quit smoking for 4 minutes, offered nicotine patch.   All the records are reviewed and case discussed with Care Management/Social Workerr. Management plans discussed with the patient, family and they are in agreement.  CODE STATUS: FUll  TOTAL TIME TAKING CARE OF THIS PATIENT: 35 minutes.     POSSIBLE D/C IN 2-3 DAYS, DEPENDING  ON CLINICAL CONDITION.   VVaughan BastaM.D on 10/17/2015   Between 7am to 6pm - Pager - [phone] After 6pm go to [email or web address] - password [email or web address]  Office  [phone] CC: Primary care physician; HLeonides Sake MD  Note: This dictation was prepared with Dragon dictation along with smaller phrase technology. Any transcriptional errors that result from this process are unintentional.

## 2015-10-17 NOTE — Progress Notes (Signed)
ANTICOAGULATION CONSULT NOTE - Initial Consult  Pharmacy Consult for Warfarin  Indication: Portal Vein Thrombosis  Allergies  Allergen Reactions  . Aspirin Shortness Of Breath  . Effexor [Venlafaxine] Anaphylaxis  . Sulfa Antibiotics Anaphylaxis  . Tetracyclines & Related Anaphylaxis  . Trazodone And Nefazodone Anaphylaxis  . Latex Hives and Itching  . Paxil [Paroxetine] Hives and Itching  . Seroquel [Quetiapine Fumarate] Hives and Itching  . Zoloft [Sertraline Hcl] Hives and Itching    Patient Measurements: Height: 5' 6"  (167.6 cm) Weight: 104 lb 4.8 oz (47.31 kg) IBW/kg (Calculated) : 59.3 Heparin Dosing Weight:   Vital Signs: Temp: 99.8 F (37.7 C) (02/20 0427) Temp Source: Oral (02/20 0427) BP: 98/58 mmHg (02/20 0427) Pulse Rate: 106 (02/20 0427)  Labs:  Recent Labs  10/14/15 1820 10/15/15 0103  10/16/15 1157 10/16/15 1744 10/17/15 0541 10/17/15 0622  HGB 11.0* 10.3*  --   --   --  8.7*  --   HCT 31.5* 29.5*  --   --   --  26.8*  --   PLT 143* 131*  --   --   --  89*  --   APTT 36  --   --   --   --   --   --   LABPROT 16.8*  --   --   --   --  17.2*  --   INR 1.35  --   --   --   --  1.39  --   HEPARINUNFRC  --  0.16*  < > 0.40 0.38  --  0.29*  CREATININE 0.33* 0.34*  --   --   --  0.44  --   < > = values in this interval not displayed.  Estimated Creatinine Clearance: 76.8 mL/min (by C-G formula based on Cr of 0.44).   Medical History: Past Medical History  Diagnosis Date  . Depression with anxiety initally at age 21  . Chlamydia 2007    bacterial vaginosis 09/2011  . Irregular heart beat 2010    wt/diet related after evaluation  . Renal disorder   . Pneumothorax, spontaneous, tension   . Alcohol abuse 11/2014    drinking since age 47. chronic, recurrent. at least 2 detox admits before 2015.   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   . Failure to thrive in adult 12/2014.     Malnutrition: n/v, not eating, weight loss, BMI 14.  s/p  01/11/2015 PEG (Dr Dorna Leitz).   . Alcoholic hepatitis 01/6062    hepatic steatosis on 11/2014 ultrasound.   . Seizure ([place]) 06/2015    due to ETOH/benzo withdrawal.   . Thrombocytopenia ([place]) 04/2007  . Colitis 12/2014    colonoscopy for diarrhea and abnml CT 01/06/15: erythmatous TI (path: active ileitis, ? emerging IBD), rectal erythema (path: mucosal prolapse). Random bx of normal colon (path unremarkable)   . Spontaneous pneumothorax 08/2012    right.  chest tube placed.     Medications:  Prescriptions prior to admission  Medication Sig Dispense Refill Last Dose  . Calcium Carb-Cholecalciferol (CALCIUM 500 +D) 500-400 MG-UNIT TABS Take 1 tablet by mouth daily.   10/14/2015 at Unknown time  . clonazePAM (KLONOPIN) 0.5 MG tablet Take 0.5 mg by mouth 2 (two) times daily.  0 10/14/2015 at Unknown time  . folic acid (FOLVITE) 1 MG tablet Take 1 tablet (1 mg total) by mouth daily. 30 tablet 0 10/14/2015 at Unknown time  . Magnesium Oxide 400 (  240 MG) MG TABS Take 400 mg by mouth 2 (two) times daily. 60 tablet 0 10/14/2015 at Unknown time  . Multiple Vitamin (MULTIVITAMIN WITH MINERALS) TABS tablet Take 1 tablet by mouth daily.   10/13/2015 at Unknown time  . omeprazole (PRILOSEC) 20 MG capsule Take 20 mg by mouth daily.   10/14/2015 at Unknown time  . ondansetron (ZOFRAN) 4 MG tablet Take 4 mg by mouth every 6 (six) hours as needed for nausea or vomiting.    Past Week at Unknown time  . Potassium 99 MG TABS Take 99 mg by mouth daily.   10/14/2015 at Unknown time  . thiamine 100 MG tablet Take 1 tablet (100 mg total) by mouth daily. 30 tablet 0 10/14/2015 at Unknown time  . feeding supplement (BOOST / RESOURCE BREEZE) LIQD Take 1 Container by mouth 3 (three) times daily between meals. (Patient not taking: Reported on 10/14/2015)  0   . levofloxacin (LEVAQUIN) 750 MG tablet Take 1 tablet (750 mg total) by mouth daily. (Patient not taking: Reported on 10/14/2015) 5 tablet 0   . Water For Irrigation, Sterile (FREE  WATER) SOLN Place 200 mLs into feeding tube every 8 (eight) hours. (Patient not taking: Reported on 06/21/2015) 1000 mL 5     Assessment: Pt already on Heparin for Portal Vein Thrombosis .   Goal of Therapy:  INR 2-3   Plan:  Warfarin 7.5 mg PO X 1 to be given 2/19 @ 18:00 followed by warfarin 5 mg PO daily to start 2/20 @ 18:00.  Will continue pt on heparin until INR is therapeutic.  02/20 AM INR: 1.49. Patient is s/p warfarin 7.51m PO x1. Patient to receive warfarin 570mPO at 1800. Will check INR with AM labs.  AlVena Rua/20/2017,8:21 AM

## 2015-10-17 NOTE — Progress Notes (Signed)
[facility] at [place] NAME: Kristine Macias    MR#:  [id number]  DATE OF BIRTH:  1986-05-12  SUBJECTIVE:  CHIEF COMPLAINT:   Chief Complaint  Patient presents with  . Ascites   sent from office by gastroenterologist for finding of portal vein thrombosis.  Patient does complain of slight abdominal pain and distention,  on heparin IV drip.  Tolerated liquid diet, will upgrade to soft diet today. Have some pain around her PEG tube insertion site.  REVIEW OF SYSTEMS:  CONSTITUTIONAL: No fever, fatigue or weakness.  EYES: No blurred or double vision.  EARS, NOSE, AND THROAT: No tinnitus or ear pain.  RESPIRATORY: No cough, shortness of breath, wheezing or hemoptysis.  CARDIOVASCULAR: No chest pain, orthopnea, edema.  GASTROINTESTINAL: No nausea, vomiting, diarrhea, positive for abdominal pain.  GENITOURINARY: No dysuria, hematuria.  ENDOCRINE: No polyuria, nocturia,  HEMATOLOGY: No anemia, easy bruising or bleeding SKIN: No rash or lesion. MUSCULOSKELETAL: No joint pain or arthritis.   NEUROLOGIC: No tingling, numbness, weakness.  PSYCHIATRY: No anxiety or depression.   ROS  DRUG ALLERGIES:   Allergies  Allergen Reactions  . Aspirin Shortness Of Breath  . Effexor [Venlafaxine] Anaphylaxis  . Sulfa Antibiotics Anaphylaxis  . Tetracyclines & Related Anaphylaxis  . Trazodone And Nefazodone Anaphylaxis  . Latex Hives and Itching  . Paxil [Paroxetine] Hives and Itching  . Seroquel [Quetiapine Fumarate] Hives and Itching  . Zoloft [Sertraline Hcl] Hives and Itching    VITALS:  Blood pressure 98/58, pulse 106, temperature 99.8 F (37.7 C), temperature source Oral, resp. rate 18, height 5' 6"  (1.676 m), weight 47.31 kg (104 lb 4.8 oz), last menstrual period 07/28/2015, SpO2 91 %.  PHYSICAL EXAMINATION:  GENERAL:  30 y.o.-year-old thin patient lying in the bed with no acute distress.  EYES: Pupils equal, round, reactive to  light and accommodation. No scleral icterus. Extraocular muscles intact.  HEENT: Head atraumatic, normocephalic. Oropharynx and nasopharynx clear.  NECK:  Supple, no jugular venous distention. No thyroid enlargement, no tenderness.  LUNGS: Normal breath sounds bilaterally, no wheezing, rales,rhonchi or crepitation. No use of accessory muscles of respiration.  CARDIOVASCULAR: S1, S2 normal. No murmurs, rubs, or gallops.  ABDOMEN: Soft, mild tender, mild distended. Bowel sounds present. No organomegaly or mass. Left upper quadrant PEG tube present. Mild tenderness around the PEG tube, no drainage or much redness around on the skin. EXTREMITIES: No pedal edema, cyanosis, or clubbing.  NEUROLOGIC: Cranial nerves II through XII are intact. Muscle strength 5/5 in all extremities. Sensation intact. Gait not checked. No signs of agitation or shaking. PSYCHIATRIC: The patient is alert and oriented x 3.  SKIN: No obvious rash, lesion, or ulcer.   Physical Exam LABORATORY PANEL:   CBC  Recent Labs Lab 10/17/15 0541  WBC 14.0*  HGB 8.7*  HCT 26.8*  PLT 89*   ------------------------------------------------------------------------------------------------------------------  Chemistries   Recent Labs Lab 10/17/15 0541  NA 135  K 3.5  CL 102  CO2 30  GLUCOSE 114*  BUN 8  CREATININE 0.44  CALCIUM 7.4*  AST 204*  ALT 45  ALKPHOS 289*  BILITOT 2.7*   ------------------------------------------------------------------------------------------------------------------  Cardiac Enzymes No results for input(s): TROPONINI in the last 168 hours. ------------------------------------------------------------------------------------------------------------------  RADIOLOGY:  Mr Lambert Mody Cm/mrcp  10/15/2015  CLINICAL DATA:  30 year old female inpatient with abdominal pain and swelling. Question of main portal vein nonocclusive thrombosis on Doppler sonography from 1 day prior. EXAM: MRI ABDOMEN  WITHOUT AND WITH  CONTRAST (INCLUDING MRCP) TECHNIQUE: Multiplanar multisequence MR imaging of the abdomen was performed both before and after the administration of intravenous contrast. Heavily T2-weighted images of the biliary and pancreatic ducts were obtained, and three-dimensional MRCP images were rendered by post processing. CONTRAST:  87m MULTIHANCE GADOBENATE DIMEGLUMINE 529 MG/ML IV SOLN COMPARISON:  10/14/2015 right upper quadrant and abdominal Doppler sonograms. 08/08/2015 CT abdomen/pelvis. FINDINGS: Lower chest: Small bilateral pleural effusions, left greater than right, new since 08/08/2015 CT. Associated compressive atelectasis in the left greater than right lower lobes. Hepatobiliary: New mild hepatomegaly (craniocaudal right liver lobe length 19.5 cm, increased from 17.4 cm on 08/08/2015). No definite liver surface irregularity. Moderate to severe diffuse hepatic steatosis. There is a subcentimeter simple liver cyst in the lateral segment left liver lobe. Otherwise no liver masses. No cholelithiasis. Mild diffuse gallbladder wall thickening. Mild gallbladder distention. Mild diffuse periportal edema. No intrahepatic biliary ductal dilatation. Minimally prominent common bile duct measuring 6 mm in diameter with smooth distal tapering. No choledocholithiasis. No enhancing biliary or ampullary mass. Pancreas: No pancreatic mass or duct dilation. Normal pancreatic parenchymal signal intensity and enhancement. No pancreas divisum. Spleen: Normal size. No mass. Adrenals/Urinary Tract: Normal adrenals. No hydronephrosis. Normal kidneys with no renal mass. Stomach/Bowel: Percutaneous button gastrostomy appears well positioned in the body of the stomach. Stomach is collapsed and grossly normal. Visualized small and large bowel is normal caliber, with no bowel wall thickening. Vascular/Lymphatic: Normal caliber abdominal aorta. Patent portal, splenic, hepatic and renal veins. Small paraumbilical varix appears  new. Probable small gastroesophageal varices. No evidence of thrombosis in the portal veins. No pathologically enlarged lymph nodes in the abdomen. Other: There is small to moderate volume ascites, which is new since 08/08/2015. Musculoskeletal: No aggressive appearing focal osseous lesions. IMPRESSION: 1. New mild hepatomegaly. Moderate to severe diffuse hepatic steatosis. No macroscopic evidence of cirrhosis. Tiny simple cyst in the left lower lobe. No suspicious liver masses. 2. New findings of portal hypertension including small to moderate volume ascites and small paraumbilical and gastroesophageal varices. Patent portal, splenic and hepatic veins. No evidence of venous thrombosis. 3. Mild diffuse gallbladder wall thickening without cholelithiasis, probably representing noninflammatory edema due to portal hypertension. 4. Minimally prominent common bile duct. No intrahepatic biliary ductal dilatation. No choledocholithiasis. No biliary or ampullary mass. 5. Small left greater than right bilateral pleural effusions, new. Electronically Signed   By: JIlona SorrelM.D.   On: 10/15/2015 19:20    ASSESSMENT AND PLAN:   Principal Problem:   Portal vein thrombosis Active Problems:   Abdominal pain, generalized   Alcoholic hepatitis with ascites   * Portal Vein thrombosis IV heparin drip. appreciated  GI consult. start on coumadin.  * Alcoholic hepatitis and ascites GI consult for further management.   MRCP- with no clear obstructions.   Have some pain around PEG site, and she is not using that anyways, let GI decide about that.  * Chronic alcoholism Last drink was before admission, she had alcohol withdrawal and seizures in the past keep monitoring for withdrawal signs, none yet.  * Chronic malnutrition This is secondary to chronic alcoholism, continue dietary supplemented.  * Smoking Counseled to quit smoking for 4 minutes, offered nicotine patch.   All the records are reviewed  and case discussed with Care Management/Social Workerr. Management plans discussed with the patient, family and they are in agreement.  CODE STATUS: FUll  TOTAL TIME TAKING CARE OF THIS PATIENT: 35 minutes.    POSSIBLE D/C IN 2-3 DAYS, DEPENDING  ON CLINICAL CONDITION.   Vaughan Basta M.D on 10/17/2015   Between 7am to 6pm - Pager - [phone]  After 6pm go to [email or web address] - password [email or web address]  Office  [phone]  CC: Primary care physician; Leonides Sake, MD  Note: This dictation was prepared with Dragon dictation along with smaller phrase technology. Any transcriptional errors that result from this process are unintentional.

## 2015-10-18 ENCOUNTER — Inpatient Hospital Stay: Payer: Medicaid Other

## 2015-10-18 DIAGNOSIS — F1994 Other psychoactive substance use, unspecified with psychoactive substance-induced mood disorder: Secondary | ICD-10-CM

## 2015-10-18 DIAGNOSIS — Z91199 Patient's noncompliance with other medical treatment and regimen due to unspecified reason: Secondary | ICD-10-CM

## 2015-10-18 DIAGNOSIS — R188 Other ascites: Secondary | ICD-10-CM

## 2015-10-18 DIAGNOSIS — Z9119 Patient's noncompliance with other medical treatment and regimen: Secondary | ICD-10-CM

## 2015-10-18 DIAGNOSIS — K766 Portal hypertension: Secondary | ICD-10-CM

## 2015-10-18 DIAGNOSIS — F1010 Alcohol abuse, uncomplicated: Secondary | ICD-10-CM

## 2015-10-18 LAB — TROPONIN I: Troponin I: 0.03 ng/mL (ref <0.031)

## 2015-10-18 MED ORDER — MIRTAZAPINE 15 MG PO TABS
15.0000 mg | ORAL_TABLET | Freq: Every day | ORAL | Status: DC
  Filled 2015-10-18 (×2): qty 1

## 2015-10-18 MED ORDER — CLONAZEPAM 0.5 MG PO TABS
0.2500 mg | ORAL_TABLET | Freq: Two times a day (BID) | ORAL | Status: DC
  Administered 2015-10-18 – 2015-10-22 (×8): 0.25 mg via ORAL
  Filled 2015-10-18 (×8): qty 1

## 2015-10-18 NOTE — Clinical Social Work Note (Signed)
Clinical Social Work Assessment  Patient Details  Name: Kristine Macias MRN: [id number] Date of Birth: 10-23-1985  Date of referral:  10/18/15               Reason for consult:  Substance Use/ETOH Abuse                Permission sought to share information with:    Permission granted to share information::     Name::        Agency::     Relationship::     Contact Information:     Housing/Transportation Living arrangements for the past 2 months:  Single Family Home Source of Information:  Patient Patient Interpreter Needed:  None Criminal Activity/Legal Involvement Pertinent to Current Situation/Hospitalization:  No - Comment as needed Significant Relationships:  Spouse Lives with:  Self, Spouse, Minor Children Do you feel safe going back to the place where you live?  Yes Need for family participation in patient care:  Yes (Comment)  Care giving concerns:  Patient resides with her husband and two minor children: ages 63 and 41.   Social Worker assessment / plan:  CSW spoke with patient this morning regarding her substance abuse. CSW noted that patient was seen on her last admission this month by CSW and given ETOH resources at that time but was not interested in speaking with CSW at that time. CSW was able to engage patient in conversation this morning but she stated she was not interested in discussing her ETOH issues or resources. She confirmed that she lives with her husband who is supportive and that she also has two minor children in the home. CSW provided support and encouragement. CSW will continue to follow as the plan of care needs at discharge has not yet been determined.   Employment status:  Disabled (Comment on whether or not currently receiving Disability) Insurance information:  Medicaid In [place] PT Recommendations:  Not assessed at this time Information / Referral to community resources:     Patient/Family's Response to care:  Patient expressed appreciation for  CSW assistance.   Patient/Family's Understanding of and Emotional Response to Diagnosis, Current Treatment, and Prognosis:  Patient is not interested in receiving any assistance for her current ETOH abuse.  Emotional Assessment Appearance:  Appears older than stated age Attitude/Demeanor/Rapport:  Lethargic Affect (typically observed):  Calm, Sad Orientation:  Oriented to Self, Oriented to Place, Oriented to  Time, Oriented to Situation Alcohol / Substance use:  Alcohol Use Psych involvement (Current and /or in the community):  No (Comment)  Discharge Needs  Concerns to be addressed:  Care Coordination, Substance Abuse Concerns Readmission within the last 30 days:  Yes Current discharge risk:  None Barriers to Discharge:  No Barriers Identified   Shela Leff, LCSW 10/18/2015, 10:52 AM

## 2015-10-18 NOTE — Progress Notes (Signed)
Notified Dr. Marthann Schiller that pt was experiencing chest pain, but had to wake pt up for her to say she was hurting. Per Dr. Marthann Schiller place order for troponins to be drawn x 1.

## 2015-10-18 NOTE — Progress Notes (Signed)
Per Dr. Rayann Heman place order for Chest x-ray 2 view stat for fever and coughing

## 2015-10-18 NOTE — Progress Notes (Signed)
[place] at [place] NAME: Kristine Macias    MR#:  [id number]  DATE OF BIRTH:  23-Feb-1986  SUBJECTIVE:  CHIEF COMPLAINT:   Chief Complaint  Patient presents with  . Ascites   sent from office by gastroenterologist for finding of portal vein thrombosis.   on heparin IV drip.  Tolerated  soft diet today. Have some pain around her PEG tube insertion site.   MRI does not show PV thrombosis, so stopped coumadin and heparin.   Have c/o some pain in abdomen, and fever last night- so will get Paracentesis.  REVIEW OF SYSTEMS:  CONSTITUTIONAL: No fever, fatigue or weakness.  EYES: No blurred or double vision.  EARS, NOSE, AND THROAT: No tinnitus or ear pain.  RESPIRATORY: No cough, shortness of breath, wheezing or hemoptysis.  CARDIOVASCULAR: No chest pain, orthopnea, edema.  GASTROINTESTINAL: No nausea, vomiting, diarrhea, positive for abdominal pain.  GENITOURINARY: No dysuria, hematuria.  ENDOCRINE: No polyuria, nocturia,  HEMATOLOGY: No anemia, easy bruising or bleeding SKIN: No rash or lesion. MUSCULOSKELETAL: No joint pain or arthritis.   NEUROLOGIC: No tingling, numbness, weakness.  PSYCHIATRY: No anxiety or depression.   ROS  DRUG ALLERGIES:   Allergies  Allergen Reactions  . Aspirin Shortness Of Breath  . Effexor [Venlafaxine] Anaphylaxis  . Sulfa Antibiotics Anaphylaxis  . Tetracyclines & Related Anaphylaxis  . Trazodone And Nefazodone Anaphylaxis  . Latex Hives and Itching  . Paxil [Paroxetine] Hives and Itching  . Seroquel [Quetiapine Fumarate] Hives and Itching  . Zoloft [Sertraline Hcl] Hives and Itching    VITALS:  Blood pressure 98/55, pulse 88, temperature 98.8 F (37.1 C), temperature source Oral, resp. rate 20, height 5' 6"  (1.676 m), weight 47.31 kg (104 lb 4.8 oz), last menstrual period 07/28/2015, SpO2 94 %.  PHYSICAL EXAMINATION:  GENERAL:  30 y.o.-year-old thin patient lying in the bed with no  acute distress.  EYES: Pupils equal, round, reactive to light and accommodation. No scleral icterus. Extraocular muscles intact.  HEENT: Head atraumatic, normocephalic. Oropharynx and nasopharynx clear.  NECK:  Supple, no jugular venous distention. No thyroid enlargement, no tenderness.  LUNGS: Normal breath sounds bilaterally, no wheezing, rales,rhonchi or crepitation. No use of accessory muscles of respiration.  CARDIOVASCULAR: S1, S2 normal. No murmurs, rubs, or gallops.  ABDOMEN: Soft, mild tender, mild distended. Bowel sounds present. No organomegaly or mass. Left upper quadrant PEG tube present. Mild tenderness around the PEG tube, no drainage or much redness around on the skin. EXTREMITIES: No pedal edema, cyanosis, or clubbing.  NEUROLOGIC: Cranial nerves II through XII are intact. Muscle strength 5/5 in all extremities. Sensation intact. Gait not checked. No signs of agitation or shaking. PSYCHIATRIC: The patient is alert and oriented x 3.  SKIN: No obvious rash, lesion, or ulcer.   Physical Exam LABORATORY PANEL:   CBC  Recent Labs Lab 10/17/15 0541  WBC 14.0*  HGB 8.7*  HCT 26.8*  PLT 89*   ------------------------------------------------------------------------------------------------------------------  Chemistries   Recent Labs Lab 10/17/15 0541  NA 135  K 3.5  CL 102  CO2 30  GLUCOSE 114*  BUN 8  CREATININE 0.44  CALCIUM 7.4*  AST 204*  ALT 45  ALKPHOS 289*  BILITOT 2.7*   ------------------------------------------------------------------------------------------------------------------  Cardiac Enzymes  Recent Labs Lab 10/18/15 1045  TROPONINI 0.03   ------------------------------------------------------------------------------------------------------------------  RADIOLOGY:  No results found.  ASSESSMENT AND PLAN:   Principal Problem:   Substance induced mood disorder (HCC) Active Problems:  Abdominal pain, generalized   Alcoholic  hepatitis with ascites   Portal vein thrombosis   Alcohol abuse   Noncompliance   Ascites   Portal hypertension (HCC)   * Portal Vein thrombosis  IV heparin drip. appreciated  GI consult.  started on coumadin.   MRI does not show thrombosis- stopped all anticoagulants.  * Ascites with fever   1st fever on 10/17/15 night   Have some pain in abdomen   Get US guided paracentesis.   Check for cell count and albumin.  * Alcoholic hepatitis and ascites GI consult for further management.     MRCP- with no clear obstructions.   Have some pain around PEG site, and she is not using that .   She was gien that by GI as she is malnourished, but she is not very complaint with diet instructions by Dietary.  * Chronic alcoholism Last drink was before admission, she had alcohol withdrawal and seizures in the past keep monitoring for withdrawal signs, none yet.   Called psych consult to give her insight.   Also spoke to her motherTye Macias- [phone]- to explain plan.  * Chronic malnutrition This is secondary to chronic alcoholism, continue dietary supplemented.   Dietician explained about supplements- she is non compliant.  * Smoking Counseled to quit smoking for 4 minutes, offered nicotine patch.   All the records are reviewed and case discussed with Care Management/Social Workerr. Management plans discussed with the patient, family and they are in agreement.  CODE STATUS: FUll  TOTAL TIME TAKING CARE OF THIS PATIENT: 35 minutes.    POSSIBLE D/C IN 2-3 DAYS, DEPENDING ON CLINICAL CONDITION. Paracentesis likely tomorrow.  Kristine Macias M.D on 10/18/2015   Between 7am to 6pm - Pager - [phone]  After 6pm go to [email or web address] - password [email or web address]  Office  [phone]  CC: Primary care physician; Kristine Sake, MD  Note: This dictation was prepared with Dragon dictation along with smaller phrase technology. Any  transcriptional errors that result from this process are unintentional.

## 2015-10-18 NOTE — Consult Note (Signed)
[facility] Consult   Reason for Consult:  Consult for this 30 year old woman with a history of alcohol abuse who presented to the hospital with a thrombosis in her portal vein Referring Physician:  Marthann Schiller Patient Identification: Henny Strauch MRN:  [id number] Principal Diagnosis: Substance induced mood disorder ([place]) Diagnosis:   Patient Active Problem List   Diagnosis Date Noted  . Substance induced mood disorder ([place]) [F19.94] 10/18/2015  . Alcohol abuse [F10.10] 10/18/2015  . Noncompliance [Z91.19] 10/18/2015  . 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 CAP (community acquired pneumonia) [J18.9] 10/05/2015  . Hepatitis [K75.9] 08/08/2015  . Nausea & vomiting [R11.2] 08/08/2015  . Alcoholic hepatitis without ascites [K70.10]   . Thrombocytopenia ([place]) [D69.6] 07/11/2015  . Hyponatremia [E87.1] 07/11/2015  . Alcoholic hepatitis with ascites [K70.11]   . Alcoholic hepatitis [B34.19] 07/09/2015  . Alcohol intoxication ([place]) [F10.129] 07/09/2015  . Withdrawal seizures ([place]) [F79.024, R56.9] 07/01/2015  . Benzodiazepine abuse [F13.10] 07/01/2015  . Drug withdrawal seizure ([place]) [O97.353, R56.9] 06/21/2015  . Chest pain [R07.9] 05/20/2015  . GAD (generalized anxiety disorder) [F41.1] 04/17/2015  . Panic disorder [F41.0] 04/17/2015  . Alcohol withdrawal ([place]) [F10.239] 04/16/2015  . Abdominal pain, generalized [R10.84] 04/16/2015  . Vomiting and diarrhea [R11.10, R19.7]   . Failure to thrive in adult [R62.7]   . Intractable nausea and vomiting [R11.10] 03/01/2015  . Hypokalemia [E87.6] 03/01/2015  . Protein-calorie malnutrition, severe ([place]) [E43] 01/05/2015  . Generalized anxiety disorder [F41.1] 11/26/2014    Class: Chronic  . Alcohol use disorder, severe, dependence ([place]) [F10.20] 11/25/2014  . Adnexal mass [N94.9] 04/10/2012    Total Time spent with patient: 1 hour  Subjective:   Kristine Macias is a 30 y.o. female patient admitted with "I had  a blood clot".  HPI:  Patient interviewed. Chart reviewed including old notes including my previous consult. Labs reviewed and vitals reviewed. This 30 year old woman came into the hospital with worsening abdominal swelling distention that been going on about a week. She was found to have a thrombus in her portal vein. Patient's consult was because of the obvious fact that she is continuing to drink despite having cirrhosis and does not seem to be taking any steps about it. Patient tells me that she drinks about 224 ounce size beers per day and that this has been her regular routine. Denies that she is using any other drugs. She claims that at home she is functional. 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 She denies being "depressed". She denies any suicidal ideation. She says she sleeps okay. She claims that she has been eating adequately. She has a history of anorexia probably in part due to the GI damage from her alcohol and in part due to behavior that has led to her having a feeding tube in the past. Recently however she claims that she has not been using the feeding tube and has been eating normally. Patient is currently taking clonazepam prescribed by her primary care doctor and not taking any medicine for depression. She has not been engaging in any kind of substance abuse treatment. She is not very forthcoming about much other detail.  Social history: Not married. Lives with her mother and HER-2 young children ages 85 and 51. Doesn't work outside the home. Taking care of her children as her primary motivation  to get better.  Medical history: Patient has a history of anorexia and malnourishment. To the point of having a feeding tube. Because of her poor abilities as a historian I have never been clear how much of the anorexia was of the atypical eating disorder  pattern and how much of it was there result of her drinking. Asian has been prescribed medicine for depression and anxiety in the past including Effexor Paxil and Seroquel. She says she does not like how many of them made her feel. Her most specific complaint is that she had withdrawal symptoms when stopping the Effexor and Paxil. This was probably a pretty frequent occurrence because she indicates that she only takes medicines when she feels like she needs them and doesn't take things on a daily basis.  Substance abuse history: Patient has been drinking heavily for years. She has been in some treatment for short periods of time but has never made her really serious attempt to stop drinking. There is some evidence in the past that she has had seizures that were related to stopping alcohol or stopping benzodiazepines. Did not have DTs in the past. 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 Although this is been a clear issue for at least months she tells me today that it was only this admission that she realized that her drinking was killing her. Patient claims that she does not abuse her benzodiazepines. In the recent past she was being prescribed significantly higher doses but her outpatient provider appears to have tapered her down quite a bit to just 0.25 mg of clonazepam twice a day  Past Psychiatric History: As mentioned above she has had past psychiatric treatment including with several antidepressants but never found them particularly helpful. Instead she has relied largely on benzodiazepines. Denies history of suicide attempts denies history of psychosis no history known of mania. Has not had inpatient psychiatric hospitalization in the past.  Risk to Self: Is patient at risk for suicide?: No Risk to Others:   Prior Inpatient Therapy:   Prior Outpatient Therapy:    Past Medical History:  Past Medical History  Diagnosis Date  . Depression with anxiety initally  at age 4  . Chlamydia 2007    bacterial vaginosis 09/2011  . Irregular heart beat 2010    wt/diet related after evaluation  . Renal disorder   . Pneumothorax, spontaneous, tension   . Alcohol abuse 11/2014    drinking since age 9. chronic, recurrent. at least 2 detox admits before 2015.   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   . Failure to thrive in adult 12/2014.     Malnutrition: n/v, not eating, weight loss, BMI 14.  s/p 01/11/2015 PEG (Dr Dorna Leitz).   . Alcoholic hepatitis 08/8839    hepatic steatosis on 11/2014 ultrasound.   . Seizure ([place]) 06/2015    due to ETOH/benzo withdrawal.   . Thrombocytopenia ([place]) 04/2007  . Colitis 12/2014    colonoscopy for diarrhea and abnml CT 01/06/15: erythmatous TI (path: active ileitis, ? emerging IBD), rectal erythema (path: mucosal prolapse). Random bx of normal colon (path unremarkable)   . Spontaneous pneumothorax 08/2012    right.  chest tube placed.     Past Surgical History  Procedure Laterality Date  . Cesarean section  04/2007, 07/2009     x 2.  G3, Para 2012 as of 07/2009.   Marland Kitchen Chest tube insertion    . Esophagogastroduodenoscopy N/A  01/06/2015    ;[facility], Dr Rayann Heman. For wt loss, N/V: Normal study, duodenal biopsy/pathology: chronic active duodenitis.   . Colonoscopy with propofol N/A 01/06/2015    [facility], Dr Rayann Heman. colonoscopy for diarrhea and abnml CT 01/06/15: erythmatous TI (path: active ileitis, ? emerging IBD), rectal erythema (path: mucosal prolapse). Random bx of normal colon (path unremarkable)   . Peg placement N/A 01/11/2015    [facility], Dr Rayann Heman.  for N/V/wt loss/severe malnutrition.   . Esophagogastroduodenoscopy N/A 01/11/2015    Rein-normal with PEG placement   Family History:  Family History  Problem Relation Age of Onset  . Anesthesia problems Neg Hx   . Thyroid disease Mother   . Cancer Father   . Stroke Other   . Crohn's disease Brother    Family Psychiatric  History: Patient denies there being any family history of  mental health or substance abuse problems Social History:  History  Alcohol Use  . 21.6 oz/week  . 17 Cans of beer per week    Comment: 6 beers/day; trying to quit, participating in AA     History  Drug Use No    Social History   Social History  . Marital Status: Single    Spouse Name: N/A  . Number of Children: N/A  . Years of Education: N/A   Social History Main Topics  . Smoking status: Current Every Day Smoker -- 2.00 packs/day    Types: Cigarettes  . Smokeless tobacco: Never Used  . Alcohol Use: 21.6 oz/week    36 Cans of beer per week     Comment: 6 beers/day; trying to quit, participating in [place]  . Drug Use: No  . Sexual Activity: Yes    Birth Control/ Protection: None   Other Topics Concern  . None   Social History Narrative   Lives with mother & 2 children   disabled   Additional Social History:    Allergies:   Allergies  Allergen Reactions  . Aspirin Shortness Of Breath  . Effexor [Venlafaxine] Anaphylaxis  . Sulfa Antibiotics Anaphylaxis  . Tetracyclines & Related Anaphylaxis  . Trazodone And Nefazodone Anaphylaxis  . Latex Hives and Itching  . Paxil [Paroxetine] Hives and Itching  . Seroquel [Quetiapine Fumarate] Hives and Itching  . Zoloft [Sertraline Hcl] Hives and Itching    Labs:  Results for orders placed or performed during the hospital encounter of 10/14/15 (from the past 48 hour(s))  Heparin level (unfractionated)     Status: None   Collection Time: 10/16/15 11:57 AM  Result Value Ref Range   Heparin Unfractionated 0.40 0.30 - 0.70 IU/mL    Comment:        IF HEPARIN RESULTS ARE BELOW EXPECTED VALUES, AND PATIENT DOSAGE HAS BEEN CONFIRMED, SUGGEST FOLLOW UP TESTING OF ANTITHROMBIN III LEVELS.   Heparin level (unfractionated)     Status: None   Collection Time: 10/16/15  5:44 PM  Result Value Ref Range   Heparin Unfractionated 0.38 0.30 - 0.70 IU/mL    Comment:        IF HEPARIN RESULTS ARE BELOW EXPECTED VALUES, AND  PATIENT DOSAGE HAS BEEN CONFIRMED, SUGGEST FOLLOW UP TESTING OF ANTITHROMBIN III LEVELS.   Comprehensive metabolic panel     Status: Abnormal   Collection Time: 10/17/15  5:41 AM  Result Value Ref Range   Sodium 135 135 - 145 mmol/L   Potassium 3.5 3.5 - 5.1 mmol/L   Chloride 102 101 - 111 mmol/L   CO2 30 22 - 32  mmol/L   Glucose, Bld 114 (H) 65 - 99 mg/dL   BUN 8 6 - 20 mg/dL   Creatinine, Ser 0.44 0.44 - 1.00 mg/dL   Calcium 7.4 (L) 8.9 - 10.3 mg/dL   Total Protein 5.2 (L) 6.5 - 8.1 g/dL   Albumin 1.8 (L) 3.5 - 5.0 g/dL   AST 204 (H) 15 - 41 U/L   ALT 45 14 - 54 U/L   Alkaline Phosphatase 289 (H) 38 - 126 U/L   Total Bilirubin 2.7 (H) 0.3 - 1.2 mg/dL   GFR calc non Af Amer >60 >60 mL/min   GFR calc Af Amer >60 >60 mL/min    Comment: (NOTE) The eGFR has been calculated using the CKD EPI equation. This calculation has not been validated in all clinical situations. eGFR's persistently <60 mL/min signify possible Chronic Kidney Disease.    Anion gap 3 (L) 5 - 15  CBC     Status: Abnormal   Collection Time: 10/17/15  5:41 AM  Result Value Ref Range   WBC 14.0 (H) 3.6 - 11.0 K/uL   RBC 2.46 (L) 3.80 - 5.20 MIL/uL   Hemoglobin 8.7 (L) 12.0 - 16.0 g/dL   HCT 26.8 (L) 35.0 - 47.0 %   MCV 109.3 (H) 80.0 - 100.0 fL   MCH 35.4 (H) 26.0 - 34.0 pg   MCHC 32.4 32.0 - 36.0 g/dL   RDW 18.5 (H) 11.5 - 14.5 %   Platelets 89 (L) 150 - 440 K/uL  Protime-INR     Status: Abnormal   Collection Time: 10/17/15  5:41 AM  Result Value Ref Range   Prothrombin Time 17.2 (H) 11.4 - 15.0 seconds   INR 1.39   Heparin level (unfractionated)     Status: Abnormal   Collection Time: 10/17/15  6:22 AM  Result Value Ref Range   Heparin Unfractionated 0.29 (L) 0.30 - 0.70 IU/mL    Comment:        IF HEPARIN RESULTS ARE BELOW EXPECTED VALUES, AND PATIENT DOSAGE HAS BEEN CONFIRMED, SUGGEST FOLLOW UP TESTING OF ANTITHROMBIN III LEVELS.   Heparin level (unfractionated)     Status: None   Collection  Time: 10/17/15  2:16 PM  Result Value Ref Range   Heparin Unfractionated 0.32 0.30 - 0.70 IU/mL    Comment:        IF HEPARIN RESULTS ARE BELOW EXPECTED VALUES, AND PATIENT DOSAGE HAS BEEN CONFIRMED, SUGGEST FOLLOW UP TESTING OF ANTITHROMBIN III LEVELS.     Current Facility-Administered Medications  Medication Dose Route Frequency Provider Last Rate Last Dose  . calcium-vitamin D (OSCAL WITH D) 500-200 MG-UNIT per tablet 1 tablet  1 tablet Oral Daily Vaughan Basta, MD   1 tablet at 10/18/15 0930  . clonazePAM (KLONOPIN) tablet 0.25 mg  0.25 mg Oral BID Gonzella Lex, MD      . folic acid (FOLVITE) tablet 1 mg  1 mg Oral Daily Vaughan Basta, MD   1 mg at 10/18/15 0930  . LORazepam (ATIVAN) tablet 1 mg  1 mg Oral Q6H PRN Lance Coon, MD       Or  . LORazepam (ATIVAN) injection 1 mg  1 mg Intravenous Q6H PRN Lance Coon, MD   1 mg at 10/17/15 2335  . LORazepam (ATIVAN) tablet 0-4 mg  0-4 mg Oral Q12H Carrie Mew, MD   2 mg at 10/17/15 1710  . magnesium oxide (MAG-OX) tablet 400 mg  400 mg Oral BID Vaughan Basta, MD   400 mg  at 10/18/15 0930  . mirtazapine (REMERON) tablet 15 mg  15 mg Oral QHS Gonzella Lex, MD      . multivitamin with minerals tablet 1 tablet  1 tablet Oral Daily Vaughan Basta, MD   1 tablet at 10/18/15 0930  . ondansetron (ZOFRAN) injection 4 mg  4 mg Intravenous 4 times per day Hillary Bow, MD   4 mg at 10/17/15 1201  . ondansetron (ZOFRAN) tablet 4 mg  4 mg Oral Q6H PRN Vaughan Basta, MD   4 mg at 10/17/15 1700  . pantoprazole (PROTONIX) EC tablet 40 mg  40 mg Oral QAC breakfast Vaughan Basta, MD   40 mg at 10/18/15 0853  . thiamine (VITAMIN B-1) tablet 100 mg  100 mg Oral Daily Vaughan Basta, MD   100 mg at 10/18/15 0930    Musculoskeletal: Strength & Muscle Tone: decreased and atrophy Gait & Station: unable to stand Patient leans: N/A  Psychiatric Specialty Exam: Review of Systems   Constitutional: Positive for malaise/fatigue.  HENT: Negative.   Eyes: Negative.   Respiratory: Negative.   Cardiovascular: Negative.   Gastrointestinal: Positive for heartburn, nausea and abdominal pain.  Musculoskeletal: Negative.   Skin: Negative.   Neurological: Positive for weakness.  Psychiatric/Behavioral: Positive for hallucinations and substance abuse. Negative for depression, suicidal ideas and memory loss. The patient is nervous/anxious and has insomnia.     Blood pressure 104/74, pulse 84, temperature 99 F (37.2 C), temperature source Oral, resp. rate 18, height 5' 6" (1.676 m), weight 47.31 kg (104 lb 4.8 oz), last menstrual period 07/28/2015, SpO2 92 %.Body mass index is 16.84 kg/(m^2).  General Appearance: Disheveled and Looks chronically sick  Engineer, water::  Minimal  Speech:  Slow and Very quiet. Answers about his minimally as she can  Volume:  Decreased  Mood:  Euthymic  Affect:  Depressed and Tearful  Thought Process:  Goal Directed  Orientation:  Full (Time, Place, and Person)  Thought Content:  Hallucinations: Visual  Suicidal Thoughts:  No  Homicidal Thoughts:  No  Memory:  Immediate;   Good Recent;   Good Remote;   Fair  Judgement:  Fair  Insight:  Fair  Psychomotor Activity:  Decreased  Concentration:  Fair  Recall:  [employer] of Knowledge:Fair  Language: Fair  Akathisia:  No  Handed:  Right  AIMS (if indicated):     Assets:  Communication Skills Housing Social Support  ADL's:  Impaired  Cognition: WNL  Sleep:      Treatment Plan Summary: Medication management and Plan 30 year old woman who is in the hospital with consequences of cirrhosis from her ongoing drinking. Patient's affect and demeanor looks like a person who is extremely depressed. As I recall from previous interviews she minimizes or denies most of this. Does not seem to feel comfortable at all in discussing her mood symptoms. There is no evidence that she is acutely suicidal and she  indicates that she now realizes that she needs to stop drinking. Patient is not currently psychotic. She is at risk of DTs still at this point and probably should be monitored for another day or so before she is likely to be safe to be discharged. As far as her mood symptoms I proposed to her that there are other antidepressants that could be tried that may have fewer or different side effects. I propose starting mirtazapine 15 mg at night. Patient was basically agreeable to that. I would not stop the clonazepam but I have trimmed the  dose down to the 0.25 mg twice a day dosing. Patient is currently just getting treatment from her primary care doctor. We will try referring her to better outpatient substance abuse treatment. She says she is currently going to [facility]. Not clear if she is really very involved in that. All to be going to the IOP. I will follow-up as needed.  Disposition: Patient does not meet criteria for psychiatric inpatient admission. Supportive therapy provided about ongoing stressors. Refer to IOP.  Alethia Berthold, MD 10/18/2015 11:21 AM

## 2015-10-18 NOTE — Progress Notes (Signed)
Notified Dr. Marthann Schiller that there is no palliative coverage in order to fulfill consult placed yesterday.

## 2015-10-18 NOTE — Progress Notes (Signed)
Initial Nutrition Assessment  DOCUMENTATION CODES:   Severe malnutrition in context of chronic illness  INTERVENTION:  Meals and snacks: Recommend to liberalize to regular diet secondary to malnutrition. Discussed small frequent meals, high calorie, high protein foods secondary to malnutrition.  Medical Nutrition Supplement Therapy: Pt refusing boost breeze and wanting it taken off tray, not drinking. Does not like any of other supplements. Have offered during past admissions EN: Pt wants PEG tube out at this time. Coordination of care: Discussed above with Dr. Anselm Jungling.  No further recommendations at this time as pt refusing to use PEG for nutrition and take supplements for additional kcals.  Please reconsult RD if change in nutrition poc.    NUTRITION DIAGNOSIS:   Malnutrition related to social / environmental circumstances as evidenced by severe depletion of body fat, severe depletion of muscle mass.    GOAL:   Patient will meet greater than or equal to 90% of their needs    MONITOR:    (Energy intake, )  REASON FOR ASSESSMENT:        ASSESSMENT:      Pt admitted with portal HTN, jaundice, ascites, varices.  Noted palliative and psych consult.    Past Medical History  Diagnosis Date  . Depression with anxiety initally at age 35  . Chlamydia 2007    bacterial vaginosis 09/2011  . Irregular heart beat 2010    wt/diet related after evaluation  . Renal disorder   . Pneumothorax, spontaneous, tension   . Alcohol abuse 11/2014    drinking since age 35. chronic, recurrent. at least 2 detox admits before 2015.   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   . Failure to thrive in adult 12/2014.     Malnutrition: n/v, not eating, weight loss, BMI 14.  s/p 01/11/2015 PEG (Dr Dorna Leitz).   . Alcoholic hepatitis 0/0938    hepatic steatosis on 11/2014 ultrasound.   . Seizure ([place]) 06/2015    due to ETOH/benzo withdrawal.   . Thrombocytopenia ([place]) 04/2007  . Colitis  12/2014    colonoscopy for diarrhea and abnml CT 01/06/15: erythmatous TI (path: active ileitis, ? emerging IBD), rectal erythema (path: mucosal prolapse). Random bx of normal colon (path unremarkable)   . Spontaneous pneumothorax 08/2012    right.  chest tube placed.     Current Nutrition: ate bagel and small bag of chips this am, tray observed  Food/Nutrition-Related History: Pt first reported takes morning pills for breakfast.  States she eats macaroni and cheese lots of it.  Tried to clarify a typical days intake and pt reports eats eggs for breakfast, potted meat sandwich for lunch and lots of macaroni and cheese for dinner.  Reports she and mother do most of cooking and meal preparation.    Reports last taking tube feeding months ago.  " I want this tube out."  "This tube prevents me from eating."   Scheduled Medications:  . calcium-vitamin D  1 tablet Oral Daily  . clonazePAM  0.5 mg Oral BID  . feeding supplement  1 Container Oral TID BM  . folic acid  1 mg Oral Daily  . LORazepam  0-4 mg Oral Q12H  . magnesium oxide  400 mg Oral BID  . multivitamin with minerals  1 tablet Oral Daily  . ondansetron (ZOFRAN) IV  4 mg Intravenous 4 times per day  . pantoprazole  40 mg Oral QAC breakfast  . thiamine  100 mg Oral Daily  Electrolyte/Renal Profile and Glucose Profile:   Recent Labs Lab 10/14/15 1820 10/15/15 0103 10/17/15 0541  NA 132* 132* 135  K 3.0* 3.3* 3.5  CL 90* 94* 102  CO2 35* 32 30  BUN 12 11 8   CREATININE 0.33* 0.34* 0.44  CALCIUM 7.7* 7.0* 7.4*  GLUCOSE 96 84 114*   Protein Profile:  Recent Labs Lab 10/14/15 1820 10/15/15 0103 10/17/15 0541  ALBUMIN 2.1* 1.8* 1.8*    Gastrointestinal Profile: Last BM: 2/18   Nutrition-Focused Physical Exam Findings: Nutrition-Focused physical exam completed. Findings are moderate to severe fat depletion, moderate to severe muscle depletion, and no edema.      Weight Change: Pt reports wt has always been  from 86 pounds to 95 pounds since she was 30 years old.     Diet Order:  DIET SOFT Room service appropriate?: Yes; Fluid consistency:: Thin  Skin:   reviewed, no issues per nursing notes   Height:   Ht Readings from Last 1 Encounters:  10/14/15 5' 6"  (1.676 m)    Weight:   Wt Readings from Last 1 Encounters:  10/14/15 104 lb 4.8 oz (47.31 kg)    Ideal Body Weight:     BMI:  Body mass index is 16.84 kg/(m^2).  Estimated Nutritional Needs:   Kcal:  BEE 1206 kcals (IF 1.0-1.2, AF 1.3) 1660-6004 kcals/d.   Protein:  (1.2-1.5 g/kg) 56-71 g/d  Fluid:  (30-50m/kg) [address]997-[address]14kcals/d.   EDUCATION NEEDS:   No education needs identified at this time  Kristine Macias B. Kristine Macias [place](pager) Weekend/On-Call pager [phone]

## 2015-10-18 NOTE — Progress Notes (Signed)
[place] at [place] NAME: Kristine Macias    MR#:  [id number]  DATE OF BIRTH:  July 28, 1986  SUBJECTIVE:  CHIEF COMPLAINT:   Chief Complaint  Patient presents with  . Ascites   sent from office by gastroenterologist for finding of portal vein thrombosis.  Patient does complain of slight abdominal pain and distention,  on heparin IV drip.  Tolerated  soft diet today. Have some pain around her PEG tube insertion site.   INR is still low.  REVIEW OF SYSTEMS:  CONSTITUTIONAL: No fever, fatigue or weakness.  EYES: No blurred or double vision.  EARS, NOSE, AND THROAT: No tinnitus or ear pain.  RESPIRATORY: No cough, shortness of breath, wheezing or hemoptysis.  CARDIOVASCULAR: No chest pain, orthopnea, edema.  GASTROINTESTINAL: No nausea, vomiting, diarrhea, positive for abdominal pain.  GENITOURINARY: No dysuria, hematuria.  ENDOCRINE: No polyuria, nocturia,  HEMATOLOGY: No anemia, easy bruising or bleeding SKIN: No rash or lesion. MUSCULOSKELETAL: No joint pain or arthritis.   NEUROLOGIC: No tingling, numbness, weakness.  PSYCHIATRY: No anxiety or depression.   ROS  DRUG ALLERGIES:   Allergies  Allergen Reactions  . Aspirin Shortness Of Breath  . Effexor [Venlafaxine] Anaphylaxis  . Sulfa Antibiotics Anaphylaxis  . Tetracyclines & Related Anaphylaxis  . Trazodone And Nefazodone Anaphylaxis  . Latex Hives and Itching  . Paxil [Paroxetine] Hives and Itching  . Seroquel [Quetiapine Fumarate] Hives and Itching  . Zoloft [Sertraline Hcl] Hives and Itching    VITALS:  Blood pressure 96/57, pulse 84, temperature 99 F (37.2 C), temperature source Oral, resp. rate 20, height 5' 6"  (1.676 m), weight 47.31 kg (104 lb 4.8 oz), last menstrual period 07/28/2015, SpO2 91 %.  PHYSICAL EXAMINATION:  GENERAL:  30 y.o.-year-old thin patient lying in the bed with no acute distress.  EYES: Pupils equal, round, reactive to light and  accommodation. No scleral icterus. Extraocular muscles intact.  HEENT: Head atraumatic, normocephalic. Oropharynx and nasopharynx clear.  NECK:  Supple, no jugular venous distention. No thyroid enlargement, no tenderness.  LUNGS: Normal breath sounds bilaterally, no wheezing, rales,rhonchi or crepitation. No use of accessory muscles of respiration.  CARDIOVASCULAR: S1, S2 normal. No murmurs, rubs, or gallops.  ABDOMEN: Soft, mild tender, mild distended. Bowel sounds present. No organomegaly or mass. Left upper quadrant PEG tube present. Mild tenderness around the PEG tube, no drainage or much redness around on the skin. EXTREMITIES: No pedal edema, cyanosis, or clubbing.  NEUROLOGIC: Cranial nerves II through XII are intact. Muscle strength 5/5 in all extremities. Sensation intact. Gait not checked. No signs of agitation or shaking. PSYCHIATRIC: The patient is alert and oriented x 3.  SKIN: No obvious rash, lesion, or ulcer.   Physical Exam LABORATORY PANEL:   CBC  Recent Labs Lab 10/17/15 0541  WBC 14.0*  HGB 8.7*  HCT 26.8*  PLT 89*   ------------------------------------------------------------------------------------------------------------------  Chemistries   Recent Labs Lab 10/17/15 0541  NA 135  K 3.5  CL 102  CO2 30  GLUCOSE 114*  BUN 8  CREATININE 0.44  CALCIUM 7.4*  AST 204*  ALT 45  ALKPHOS 289*  BILITOT 2.7*   ------------------------------------------------------------------------------------------------------------------  Cardiac Enzymes No results for input(s): TROPONINI in the last 168 hours. ------------------------------------------------------------------------------------------------------------------  RADIOLOGY:  No results found.  ASSESSMENT AND PLAN:   Principal Problem:   Portal vein thrombosis Active Problems:   Abdominal pain, generalized   Alcoholic hepatitis with ascites   * Portal Vein thrombosis IV heparin  drip. appreciated  GI consult. started on coumadin.   Pharmacy to help manage INR 2-3.  * Alcoholic hepatitis and ascites GI consult for further management.   Does not have any fever. No need for paracentesis.   MRCP- with no clear obstructions.   Have some pain around PEG site, and she is not using that anyways, let GI decide about that.  * Chronic alcoholism Last drink was before admission, she had alcohol withdrawal and seizures in the past keep monitoring for withdrawal signs, none yet.  * Chronic malnutrition This is secondary to chronic alcoholism, continue dietary supplemented.  * Smoking Counseled to quit smoking for 4 minutes, offered nicotine patch.   All the records are reviewed and case discussed with Care Management/Social Workerr. Management plans discussed with the patient, family and they are in agreement.  CODE STATUS: FUll  TOTAL TIME TAKING CARE OF THIS PATIENT: 35 minutes.    POSSIBLE D/C IN 2-3 DAYS, DEPENDING ON CLINICAL CONDITION.   Vaughan Basta M.D on 10/18/2015   Between 7am to 6pm - Pager - [phone]  After 6pm go to [email or web address] - password [email or web address] [facility]  Office  [phone]  CC: Primary care physician; Leonides Sake, MD  Note: This dictation was prepared with Dragon dictation along with smaller phrase technology. Any transcriptional errors that result from this process are unintentional.

## 2015-10-19 ENCOUNTER — Inpatient Hospital Stay: Payer: Medicaid Other

## 2015-10-19 LAB — CBC
HEMATOCRIT: 26.5 % — AB (ref 35.0–47.0)
Hemoglobin: 8.6 g/dL — ABNORMAL LOW (ref 12.0–16.0)
MCH: 36.5 pg — ABNORMAL HIGH (ref 26.0–34.0)
MCHC: 32.6 g/dL (ref 32.0–36.0)
MCV: 112.1 fL — ABNORMAL HIGH (ref 80.0–100.0)
Platelets: 79 10*3/uL — ABNORMAL LOW (ref 150–440)
RBC: 2.37 MIL/uL — ABNORMAL LOW (ref 3.80–5.20)
RDW: 18.6 % — AB (ref 11.5–14.5)
WBC: 7 10*3/uL (ref 3.6–11.0)

## 2015-10-19 LAB — COMPREHENSIVE METABOLIC PANEL
ALK PHOS: 206 U/L — AB (ref 38–126)
ALT: 25 U/L (ref 14–54)
ANION GAP: 4 — AB (ref 5–15)
AST: 77 U/L — AB (ref 15–41)
Albumin: 1.8 g/dL — ABNORMAL LOW (ref 3.5–5.0)
BILIRUBIN TOTAL: 1.5 mg/dL — AB (ref 0.3–1.2)
BUN: 5 mg/dL — AB (ref 6–20)
CALCIUM: 7.4 mg/dL — AB (ref 8.9–10.3)
CO2: 25 mmol/L (ref 22–32)
Chloride: 109 mmol/L (ref 101–111)
Creatinine, Ser: 0.31 mg/dL — ABNORMAL LOW (ref 0.44–1.00)
GFR calc Af Amer: >60 mL/min (ref >60)
Glucose, Bld: 104 mg/dL — ABNORMAL HIGH (ref 65–99)
POTASSIUM: 3.5 mmol/L (ref 3.5–5.1)
Sodium: 138 mmol/L (ref 135–145)
TOTAL PROTEIN: 5.5 g/dL — AB (ref 6.5–8.1)

## 2015-10-19 LAB — PROTEIN, BODY FLUID

## 2015-10-19 LAB — BODY FLUID CELL COUNT WITH DIFFERENTIAL
EOS FL: 0 %
LYMPHS FL: 43 %
MONOCYTE-MACROPHAGE-SEROUS FLUID: 27 %
Neutrophil Count, Fluid: 30 %
OTHER CELLS FL: 0 %
Total Nucleated Cell Count, Fluid: 178 cu mm

## 2015-10-19 LAB — ALBUMIN, FLUID (OTHER)

## 2015-10-19 MED ORDER — LEVOFLOXACIN 500 MG PO TABS
500.0000 mg | ORAL_TABLET | Freq: Every day | ORAL | Status: DC
  Administered 2015-10-19 – 2015-10-22 (×4): 500 mg via ORAL
  Filled 2015-10-19 (×5): qty 1

## 2015-10-19 MED ORDER — OXYCODONE HCL 5 MG PO TABS
5.0000 mg | ORAL_TABLET | Freq: Four times a day (QID) | ORAL | Status: DC | PRN
  Filled 2015-10-19: qty 1

## 2015-10-19 NOTE — Care Management (Signed)
patient has spiked fever and work up in progress.  Per attending, anticipate paracentesis today.   Hope to discharge within the next 24 hours if stable

## 2015-10-19 NOTE — Progress Notes (Signed)
[place] at [place] NAME: Kristine Macias    MR#:  [id number]  DATE OF BIRTH:  1986/04/07  SUBJECTIVE:  CHIEF COMPLAINT:   Chief Complaint  Patient presents with  . Ascites  some cough, sleepy.  No pain.  Waiting for ultrasound guided paracentesis today  REVIEW OF SYSTEMS:  CONSTITUTIONAL: No fever, fatigue or weakness.  EYES: No blurred or double vision.  EARS, NOSE, AND THROAT: No tinnitus or ear pain.  RESPIRATORY: + for cough, shortness of breath, wheezing or hemoptysis.  CARDIOVASCULAR: No chest pain, orthopnea, edema.  GASTROINTESTINAL: No nausea, vomiting, diarrhea, positive for abdominal pain.  GENITOURINARY: No dysuria, hematuria.  ENDOCRINE: No polyuria, nocturia,  HEMATOLOGY: No anemia, easy bruising or bleeding SKIN: No rash or lesion. MUSCULOSKELETAL: No joint pain or arthritis.   NEUROLOGIC: No tingling, numbness, weakness.  PSYCHIATRY: No anxiety or depression.   ROS  DRUG ALLERGIES:   Allergies  Allergen Reactions  . Aspirin Shortness Of Breath  . Effexor [Venlafaxine] Anaphylaxis  . Sulfa Antibiotics Anaphylaxis  . Tetracyclines & Related Anaphylaxis  . Trazodone And Nefazodone Anaphylaxis  . Latex Hives and Itching  . Paxil [Paroxetine] Hives and Itching  . Seroquel [Quetiapine Fumarate] Hives and Itching  . Zoloft [Sertraline Hcl] Hives and Itching    VITALS:  Blood pressure 108/69, pulse 94, temperature 99.1 F (37.3 C), temperature source Oral, resp. rate 18, height 5' 6"  (1.676 m), weight 47.31 kg (104 lb 4.8 oz), last menstrual period 07/28/2015, SpO2 92 %.  PHYSICAL EXAMINATION:  GENERAL:  30 y.o.-year-old thin patient lying in the bed with no acute distress.  EYES: Pupils equal, round, reactive to light and accommodation. No scleral icterus. Extraocular muscles intact.  HEENT: Head atraumatic, normocephalic. Oropharynx and nasopharynx clear.  NECK:  Supple, no jugular venous distention.  No thyroid enlargement, no tenderness.  LUNGS: Normal breath sounds bilaterally, no wheezing, rales,rhonchi or crepitation. No use of accessory muscles of respiration.  CARDIOVASCULAR: S1, S2 normal. No murmurs, rubs, or gallops.  ABDOMEN: Soft, mild tender, mild distended. Bowel sounds present. No organomegaly or mass. Left upper quadrant PEG tube present. Mild tenderness around the PEG tube, no drainage or much redness around on the skin. EXTREMITIES: No pedal edema, cyanosis, or clubbing.  NEUROLOGIC: Cranial nerves II through XII are intact. Muscle strength 5/5 in all extremities. Sensation intact. Gait not checked. No signs of agitation or shaking. PSYCHIATRIC: The patient is alert and oriented x 3.  SKIN: No obvious rash, lesion, or ulcer.   Physical Exam LABORATORY PANEL:   CBC  Recent Labs Lab 10/19/15 0558  WBC 7.0  HGB 8.6*  HCT 26.5*  PLT 79*   ------------------------------------------------------------------------------------------------------------------  Chemistries   Recent Labs Lab 10/19/15 0558  NA 138  K 3.5  CL 109  CO2 25  GLUCOSE 104*  BUN 5*  CREATININE 0.31*  CALCIUM 7.4*  AST 77*  ALT 25  ALKPHOS 206*  BILITOT 1.5*   ------------------------------------------------------------------------------------------------------------------  Cardiac Enzymes  Recent Labs Lab 10/18/15 1045  TROPONINI 0.03   ------------------------------------------------------------------------------------------------------------------  RADIOLOGY:  Dg Chest 2 View  10/18/2015  CLINICAL DATA:  Fever and cough today EXAM: CHEST  2 VIEW COMPARISON:  10/03/2010 FINDINGS: There is consolidation in the posterior basal segment of the left lower lobe. Left pleural effusion is also suspected. No pneumothorax. Normal heart size. IMPRESSION: Left lower lobe consolidation. Followup PA and lateral chest X-ray is recommended in 3-4 weeks following trial of antibiotic therapy to  ensure resolution and exclude underlying malignancy. Electronically Signed   By: Marybelle Killings M.D.   On: 10/18/2015 18:03   US Paracentesis  10/19/2015  INDICATION: Fever and ascites. EXAM: ULTRASOUND GUIDED PARACENTESIS MEDICATIONS: None. COMPLICATIONS: None immediate. PROCEDURE: Informed written consent was obtained from the patient after a discussion of the risks, benefits and alternatives to treatment. A timeout was performed prior to the initiation of the procedure. Initial ultrasound scanning demonstrates a moderate amount of ascites within the right lower abdominal quadrant. The right lower abdomen was prepped and draped in the usual sterile fashion. 1% lidocaine with epinephrine was used for local anesthesia. Following this, a 19 gauge, 7-cm, Yueh catheter was introduced. An ultrasound image was saved for documentation purposes. The paracentesis was performed. The catheter was removed and a dressing was applied. The patient tolerated the procedure well without immediate post procedural complication. FINDINGS: A total of approximately 920 mL of yellow fluid was removed. Samples were sent to the laboratory as requested by the clinical team. IMPRESSION: Successful ultrasound-guided paracentesis yielding 920 mL of peritoneal fluid. Electronically Signed   By: Markus Daft M.D.   On: 10/19/2015 15:39    ASSESSMENT AND PLAN:   Principal Problem:   Substance induced mood disorder (HCC) Active Problems:   Abdominal pain, generalized   Alcoholic hepatitis with ascites   Portal vein thrombosis   Alcohol abuse   Noncompliance   Ascites   Portal hypertension (HCC)  * Pneumonia: Seen on recent chest x-ray left lower lobe.  Start  Levaquin  * Portal Vein thrombosis Ruled out   MRI does not show thrombosis- stopped all anticoagulants.  * Ascites with fever   1st fever on 10/17/15 night   Have some pain in abdomen   Status post US guided paracentesis - Successful ultrasound-guided paracentesis  yielding 920 mL of peritoneal fluid     * Alcoholic hepatitis and ascites GI consult for further management.     MRCP- with no clear obstructions.   Have some pain around PEG site, and she is not using that .   * Chronic alcoholism Last drink was before admission, she had alcohol withdrawal and seizures in the past keep monitoring for withdrawal signs, none yet. Appreciate psychiatry input - started mirtazapine 15 mg at night.  Recommended not to stop the clonazepam but decreased the dose down to the 0.25 mg twice a day  motherTye [phone]- to explain plan.  * Chronic malnutrition This is secondary to chronic alcoholism, continue dietary supplemented.   Dietician explained about supplements- she is non compliant.  * Smoking Counseled to quit smoking for 4 minutes, offered nicotine patch.   All the records are reviewed and case discussed with Care Management/Social Worker. Management plans discussed with the patient, family and they are in agreement.  CODE STATUS: FUll  TOTAL TIME TAKING CARE OF THIS PATIENT: 35 minutes.    POSSIBLE D/C IN 1-2 DAYS, DEPENDING ON CLINICAL CONDITION. Paracentesis likely tomorrow.  [facility], Willodean Leven M.D on 10/19/2015   Between 7am to 6pm - Pager - [phone]  After 6pm go to [email or web address] - password [email or web address] [facility]  Office  [phone]  CC: Primary care physician; Leonides Sake, MD  Note: This dictation was prepared with Dragon dictation along with smaller phrase technology. Any transcriptional errors that result from this process are unintentional.

## 2015-10-19 NOTE — Progress Notes (Signed)
GI Inpatient Follow-up Note  Patient Identification: Kristine Macias is a 30 y.o. female with decomp ETOH liver disease, severe malnutrition  Subjective:  + cough, + fever overnight.  + abd distended.  Wants PEG tube out. Not interested in ever using it again.  No rectal bleeding, melena. + abd pain near PEG tube site.   Scheduled Inpatient Medications:  . calcium-vitamin D  1 tablet Oral Daily  . clonazePAM  0.25 mg Oral BID  . folic acid  1 mg Oral Daily  . levofloxacin  500 mg Oral Daily  . LORazepam  0-4 mg Oral Q12H  . magnesium oxide  400 mg Oral BID  . mirtazapine  15 mg Oral QHS  . multivitamin with minerals  1 tablet Oral Daily  . ondansetron (ZOFRAN) IV  4 mg Intravenous 4 times per day  . pantoprazole  40 mg Oral QAC breakfast  . thiamine  100 mg Oral Daily    Continuous Inpatient Infusions:     PRN Inpatient Medications:  LORazepam **OR** LORazepam, ondansetron  Review of Systems: Constitutional: Weight is stable.  Eyes: No changes in vision. ENT: No oral lesions, sore throat.  GI: see HPI.  Heme/Lymph: +easy bruising.  CV: + chest pain.  GU: No hematuria.  Integumentary: No rashes.  Neuro: No headaches.  Psych: + depression/anxiety.  Endocrine: No heat/cold intolerance.  Allergic/Immunologic: No urticaria.  Resp: + cough, and SOB.  Musculoskeletal: No joint swelling.    Physical Examination: BP 110/66 mmHg  Pulse 101  Temp(Src) 100.1 F (37.8 C) (Oral)  Resp 16  Ht 5' 6"  (1.676 m)  Wt 47.31 kg (104 lb 4.8 oz)  BMI 16.84 kg/m2  SpO2 94%  LMP 07/28/2015 (Approximate) Gen: NAD, alert and oriented x 4, emaciated Neck: supple, no JVD or thyromegaly Chest: decreased breath sound lower left, no wheezes, crackles, or other adventitious sounds CV: RRR, no m/g/c/r Abd: soft, NT, +BS in all four quadrants; no HSM, guarding, ridigity, or rebound tenderness, + +distended, not taught, + PEG c/d/i Ext: no edema, well perfused with 2+ pulses, Skin: no  rash or lesions noted Lymph: no LAD  Data: Lab Results  Component Value Date   WBC 7.0 10/19/2015   HGB 8.6* 10/19/2015   HCT 26.5* 10/19/2015   MCV 112.1* 10/19/2015   PLT 79* 10/19/2015    Recent Labs Lab 10/15/15 0103 10/17/15 0541 10/19/15 0558  HGB 10.3* 8.7* 8.6*   Lab Results  Component Value Date   NA 138 10/19/2015   K 3.5 10/19/2015   CL 109 10/19/2015   CO2 25 10/19/2015   BUN 5* 10/19/2015   CREATININE 0.31* 10/19/2015   Lab Results  Component Value Date   ALT 25 10/19/2015   AST 77* 10/19/2015   ALKPHOS 206* 10/19/2015   BILITOT 1.5* 10/19/2015    Recent Labs Lab 10/14/15 1820 10/17/15 0541  APTT 36  --   INR 1.35 1.39   Assessment/Plan: Kristine Macias is a 30 y.o. female with decompensated liver disease from ETOH abuse and severe malnutrition likely from anorexia.  She has decomp liver disease with ascites, portal HTN, jaundice. Also has Pna.  She is dying  from the ETOH liver disease and the anorexia.   I have requested the help of Psychiatry, social work, nutrition this hosp and during prev hospitalizations but she is refusing most of their services and per psych not a candidate for inpatient.   She also is requesting her PEG tube be reomved. Has not used  in months and has no interest in ever using again. She understood it cannot be placed again since she now has ascites and she still wanted it removed. Since she now has also has ascites and would be nidus for infection  And she has no interest in using it, I went ahead and removed the PEG tube.    She is dying from decompensated ETOH liver disease and severe malnutrition likely from anorexia.     Recommendations: - treat Pna per hospitalist - diagnostic only paracentesis to r/o SBP - Low Na diet - palliative care consult.     Please call with questions or concerns.  [facility], Grace Blight, MD

## 2015-10-19 NOTE — Procedures (Signed)
Successful US guided paracentesis.  Removed 920 ml of yellow fluid without complication.  Minimal blood loss.

## 2015-10-20 NOTE — Progress Notes (Signed)
[place] at [place] NAME: Kristine Macias    MR#:  [id number]  DATE OF BIRTH:  [birth year]  SUBJECTIVE:  CHIEF COMPLAINT:   Chief Complaint  Patient presents with  . Ascites  some cough, hypotensive.  REVIEW OF SYSTEMS:  CONSTITUTIONAL: No fever, fatigue or weakness.  EYES: No blurred or double vision.  EARS, NOSE, AND THROAT: No tinnitus or ear pain.  RESPIRATORY: + for cough, shortness of breath, wheezing or hemoptysis.  CARDIOVASCULAR: No chest pain, orthopnea, edema.  GASTROINTESTINAL: No nausea, vomiting, diarrhea, positive for abdominal pain.  GENITOURINARY: No dysuria, hematuria.  ENDOCRINE: No polyuria, nocturia,  HEMATOLOGY: No anemia, easy bruising or bleeding SKIN: No rash or lesion. MUSCULOSKELETAL: No joint pain or arthritis.   NEUROLOGIC: No tingling, numbness, weakness.  PSYCHIATRY: No anxiety or depression.   ROS  DRUG ALLERGIES:   Allergies  Allergen Reactions  . Aspirin Shortness Of Breath  . Effexor [Venlafaxine] Anaphylaxis  . Sulfa Antibiotics Anaphylaxis  . Tetracyclines & Related Anaphylaxis  . Trazodone And Nefazodone Anaphylaxis  . Latex Hives and Itching  . Paxil [Paroxetine] Hives and Itching  . Seroquel [Quetiapine Fumarate] Hives and Itching  . Zoloft [Sertraline Hcl] Hives and Itching    VITALS:  Blood pressure 95/61, pulse 75, temperature 98.5 F (36.9 C), temperature source Oral, resp. rate 18, height 5' 6"  (1.676 m), weight 47.31 kg (104 lb 4.8 oz), last menstrual period 07/28/2015, SpO2 97 %.  PHYSICAL EXAMINATION:  GENERAL:  30 y.o.-year-old thin patient lying in the bed with no acute distress.  EYES: Pupils equal, round, reactive to light and accommodation. No scleral icterus. Extraocular muscles intact.  HEENT: Head atraumatic, normocephalic. Oropharynx and nasopharynx clear.  NECK:  Supple, no jugular venous distention. No thyroid enlargement, no tenderness.  LUNGS: Normal  breath sounds bilaterally, no wheezing, rales,rhonchi or crepitation. No use of accessory muscles of respiration.  CARDIOVASCULAR: S1, S2 normal. No murmurs, rubs, or gallops.  ABDOMEN: Soft, mild tender, mild distended. Bowel sounds present. No organomegaly or mass. Left upper quadrant PEG tube present. Mild tenderness around the PEG tube, no drainage or much redness around on the skin. EXTREMITIES: No pedal edema, cyanosis, or clubbing.  NEUROLOGIC: Cranial nerves II through XII are intact. Muscle strength 5/5 in all extremities. Sensation intact. Gait not checked. No signs of agitation or shaking. PSYCHIATRIC: The patient is alert and oriented x 3.  SKIN: No obvious rash, lesion, or ulcer.   Physical Exam LABORATORY PANEL:   CBC  Recent Labs Lab 10/19/15 0558  WBC 7.0  HGB 8.6*  HCT 26.5*  PLT 79*   ------------------------------------------------------------------------------------------------------------------  Chemistries   Recent Labs Lab 10/19/15 0558  NA 138  K 3.5  CL 109  CO2 25  GLUCOSE 104*  BUN 5*  CREATININE 0.31*  CALCIUM 7.4*  AST 77*  ALT 25  ALKPHOS 206*  BILITOT 1.5*   ------------------------------------------------------------------------------------------------------------------  Cardiac Enzymes  Recent Labs Lab 10/18/15 1045  TROPONINI 0.03   ------------------------------------------------------------------------------------------------------------------  RADIOLOGY:  Dg Chest 2 View  10/18/2015  CLINICAL DATA:  Fever and cough today EXAM: CHEST  2 VIEW COMPARISON:  10/03/2010 FINDINGS: There is consolidation in the posterior basal segment of the left lower lobe. Left pleural effusion is also suspected. No pneumothorax. Normal heart size. IMPRESSION: Left lower lobe consolidation. Followup PA and lateral chest X-ray is recommended in 3-4 weeks following trial of antibiotic therapy to ensure resolution and exclude underlying malignancy.  Electronically Signed  By: Marybelle Killings M.D.   On: 10/18/2015 18:03   US Paracentesis  10/19/2015  ADDENDUM REPORT: 10/19/2015 18:02 ADDENDUM: Correction: 1% lidocaine was used for local anesthetic. Electronically Signed   By: Markus Daft M.D.   On: 10/19/2015 18:02  10/19/2015  INDICATION: Fever and ascites. EXAM: ULTRASOUND GUIDED PARACENTESIS MEDICATIONS: None. COMPLICATIONS: None immediate. PROCEDURE: Informed written consent was obtained from the patient after a discussion of the risks, benefits and alternatives to treatment. A timeout was performed prior to the initiation of the procedure. Initial ultrasound scanning demonstrates a moderate amount of ascites within the right lower abdominal quadrant. The right lower abdomen was prepped and draped in the usual sterile fashion. 1% lidocaine with epinephrine was used for local anesthesia. Following this, a 19 gauge, 7-cm, Yueh catheter was introduced. An ultrasound image was saved for documentation purposes. The paracentesis was performed. The catheter was removed and a dressing was applied. The patient tolerated the procedure well without immediate post procedural complication. FINDINGS: A total of approximately 920 mL of yellow fluid was removed. Samples were sent to the laboratory as requested by the clinical team. IMPRESSION: Successful ultrasound-guided paracentesis yielding 920 mL of peritoneal fluid. Electronically Signed: By: Markus Daft M.D. On: 10/19/2015 15:39    ASSESSMENT AND PLAN:   Principal Problem:   Substance induced mood disorder (HCC) Active Problems:   Abdominal pain, generalized   Alcoholic hepatitis with ascites   Portal vein thrombosis   Alcohol abuse   Noncompliance   Ascites   Portal hypertension (HCC)  * Pneumonia: Seen on recent chest x-ray left lower lobe.  Started Levaquin. Now getting more hypotensive than usual so would like to watch.  * Portal Vein thrombosis Ruled out   MRI does not show thrombosis- stopped  all anticoagulants.  * Ascites with fever   1st fever on 10/17/15 night   Have some pain in abdomen   Status post US guided paracentesis - Successful ultrasound-guided paracentesis yielding 920 mL of peritoneal fluid     * Alcoholic hepatitis and ascites GI consult for further management.     MRCP- with no clear obstructions.   Have some pain around PEG site, and she is not using that .   * Chronic alcoholism Last drink was before admission, she had alcohol withdrawal and seizures in the past keep monitoring for withdrawal signs, none yet. Appreciate psychiatry input - started mirtazapine 15 mg at night.  Recommended not to stop the clonazepam but decreased the dose down to the 0.25 mg twice a day  motherTye [phone]- to explain plan.  * Chronic malnutrition This is secondary to chronic alcoholism, continue dietary supplemented.   Dietician explained about supplements- she is non compliant.  * Smoking Counseled to quit smoking for 4 minutes, offered nicotine patch.   All the records are reviewed and case discussed with Care Management/Social Worker. Management plans discussed with the patient, family and they are in agreement.  CODE STATUS: Full  TOTAL TIME TAKING CARE OF THIS PATIENT: 35 minutes.    POSSIBLE D/C IN am, DEPENDING ON CLINICAL CONDITION.   [facility], Boubacar Lerette M.D on 10/20/2015   Between 7am to 6pm - Pager - [phone]  After 6pm go to [email or web address] - password [email or web address] [facility]  Office  [phone]  CC: Primary care physician; Leonides Sake, MD  Note: This dictation was prepared with Dragon dictation along with smaller phrase technology. Any transcriptional errors that result from this process are unintentional.

## 2015-10-20 NOTE — Progress Notes (Signed)
GI Inpatient Follow-up Note  Patient Identification: Kristine Macias is a 30 y.o. female with decomp ETOH liver disease, severe malnutrition  Subjective:  + cough, + SOB.  No pain or leakage at PEG site removal area.  No rectal bleeding, melena.  No n/v.    Ascites fluid neg for SBP.    Scheduled Inpatient Medications:  . calcium-vitamin D  1 tablet Oral Daily  . clonazePAM  0.25 mg Oral BID  . folic acid  1 mg Oral Daily  . levofloxacin  500 mg Oral Daily  . LORazepam  0-4 mg Oral Q12H  . magnesium oxide  400 mg Oral BID  . mirtazapine  15 mg Oral QHS  . multivitamin with minerals  1 tablet Oral Daily  . ondansetron (ZOFRAN) IV  4 mg Intravenous 4 times per day  . pantoprazole  40 mg Oral QAC breakfast  . thiamine  100 mg Oral Daily    Continuous Inpatient Infusions:     PRN Inpatient Medications:  LORazepam **OR** LORazepam, ondansetron, oxyCODONE  Review of Systems: Constitutional: Weight is stable.  Eyes: No changes in vision. ENT: No oral lesions, sore throat.  GI: see HPI.  Heme/Lymph: +easy bruising.  CV: + chest pain.  GU: No hematuria.  Integumentary: No rashes.  Neuro: No headaches.  Psych: + depression/anxiety.  Endocrine: No heat/cold intolerance.  Allergic/Immunologic: No urticaria.  Resp: + cough, and SOB.  Musculoskeletal: No joint swelling.    Physical Examination: BP 97/64 mmHg  Pulse 84  Temp(Src) 98.5 F (36.9 C) (Oral)  Resp 16  Ht 5' 6"  (1.676 m)  Wt 47.31 kg (104 lb 4.8 oz)  BMI 16.84 kg/m2  SpO2 98%  LMP 07/28/2015 (Approximate) Gen: NAD, alert and oriented x 4, emaciated Neck: supple, no JVD or thyromegaly Chest: decreased breath sound lower left, no wheezes, crackles, or other adventitious sounds CV: RRR, no m/g/c/r Abd: soft, NT, +BS in all four quadrants; no HSM, guarding, ridigity, or rebound tenderness, + +distended, not taught, old PEG site c/d/i Ext: no edema, well perfused with 2+ pulses, Skin: no rash or lesions  noted Lymph: no LAD  Data: Lab Results  Component Value Date   WBC 7.0 10/19/2015   HGB 8.6* 10/19/2015   HCT 26.5* 10/19/2015   MCV 112.1* 10/19/2015   PLT 79* 10/19/2015    Recent Labs Lab 10/15/15 0103 10/17/15 0541 10/19/15 0558  HGB 10.3* 8.7* 8.6*   Lab Results  Component Value Date   NA 138 10/19/2015   K 3.5 10/19/2015   CL 109 10/19/2015   CO2 25 10/19/2015   BUN 5* 10/19/2015   CREATININE 0.31* 10/19/2015   Lab Results  Component Value Date   ALT 25 10/19/2015   AST 77* 10/19/2015   ALKPHOS 206* 10/19/2015   BILITOT 1.5* 10/19/2015    Recent Labs Lab 10/14/15 1820 10/17/15 0541  APTT 36  --   INR 1.35 1.39   Assessment/Plan: Ms. Younker is a 30 y.o. female with decompensated liver disease from ETOH abuse and severe malnutrition likely from anorexia.  She has decomp liver disease with ascites, portal HTN, jaundice. Also has Pna.    Ascites fluid negative for SBP.   Very high mortality risk currently given her decomp liver disease and severe malnutriton.   Recommendations: - continue treating pna - Low Na diet - palliative care consult.  - ETOH cessation at all costs, may continue downward decline toward death even if she stops all ETOH.  Please call with questions or concerns.  REIN, Grace Blight, MD

## 2015-10-21 MED ORDER — SODIUM CHLORIDE 0.9 % IV SOLN
INTRAVENOUS | Status: AC
  Administered 2015-10-21 – 2015-10-22 (×2): via INTRAVENOUS

## 2015-10-21 MED ORDER — LORAZEPAM 2 MG PO TABS
0.0000 mg | ORAL_TABLET | Freq: Four times a day (QID) | ORAL | Status: DC | PRN
  Administered 2015-10-21 (×2): 1 mg via ORAL
  Administered 2015-10-22: 2 mg via ORAL
  Filled 2015-10-21 (×3): qty 1

## 2015-10-21 NOTE — Care Management Note (Signed)
Case Management Note  Patient Details  Name: Kristine Macias MRN: [id number] Date of Birth: 1986-04-09  Subjective/Objective:                 Patient admitted with portal vein thrombus.  Patient acknowledges that she drinks alcohol on a nightly basis.  Patient is open with [facility] for home health RN.  Dr. Manuella Ghazi has discussed the prognosis with the patient and agrees that the patient could benefit from home health that could be transitioned to hospice care when appropriate.  Patient has given me permission to pursue and agency that can meet these needs.  [facility] does not cover the area in which she lives.  [facility] is not accepting Medicaid patients at this time.  Malachy Mood from [facility] states that if patient continues to be followed by [facility], patient can have an Hospice screen done in the community by [facility] and transitioned to Hospice on an outpatient basis if appropriate.  Will fax information to [facility].  Will update Abby from [facility] and patient.  Patient would also benefit from home health SW.  Will need resumption of care orders   Action/Plan:   Expected Discharge Date:                  Expected Discharge Plan:     In-House Referral:     Discharge planning Services     Post Acute Care Choice:    Choice offered to:     DME Arranged:    DME Agency:     HH Arranged:    [place] Agency:     Status of Service:     Medicare Important Message Given:    Date Medicare IM Given:    Medicare IM give by:    Date Additional Medicare IM Given:    Additional Medicare Important Message give by:     If discussed at [place] of Stay Meetings, dates discussed:    Additional Comments:  Beverly Sessions, RN 10/21/2015, 12:24 PM

## 2015-10-21 NOTE — Consult Note (Signed)
  Psychiatry: Follow-up for patient with alcohol dependence and depressed mood. Patient briefly seen. Chart reviewed. Patient states that she is feeling much better. No suicidal ideation. Eating better. I will follow up after the weekend if she is still here. Otherwise patient encouraged to continue plans to continue sobriety continue current medicine.

## 2015-10-21 NOTE — Progress Notes (Signed)
Kristine Macias at [place] NAME: Kristine Macias    MR#:  [id number]  DATE OF BIRTH:  [birth year]  SUBJECTIVE:  CHIEF COMPLAINT:   Chief Complaint  Patient presents with  . Ascites   pressure dropping further, now in 80s, patient crying   REVIEW OF SYSTEMS:  CONSTITUTIONAL: No fever, fatigue or weakness.  EYES: No blurred or double vision.  EARS, NOSE, AND THROAT: No tinnitus or ear pain.  RESPIRATORY: + for cough, No shortness of breath, wheezing or hemoptysis.  CARDIOVASCULAR: No chest pain, orthopnea, edema.  GASTROINTESTINAL: No nausea, vomiting, diarrhea, positive for abdominal pain.  GENITOURINARY: No dysuria, hematuria.  ENDOCRINE: No polyuria, nocturia,  HEMATOLOGY: No anemia, easy bruising or bleeding SKIN: No rash or lesion. MUSCULOSKELETAL: No joint pain or arthritis.   NEUROLOGIC: No tingling, numbness, weakness.  PSYCHIATRY: No anxiety or depression.   ROS  DRUG ALLERGIES:   Allergies  Allergen Reactions  . Aspirin Shortness Of Breath  . Effexor [Venlafaxine] Anaphylaxis  . Sulfa Antibiotics Anaphylaxis  . Tetracyclines & Related Anaphylaxis  . Trazodone And Nefazodone Anaphylaxis  . Latex Hives and Itching  . Paxil [Paroxetine] Hives and Itching  . Seroquel [Quetiapine Fumarate] Hives and Itching  . Zoloft [Sertraline Hcl] Hives and Itching    VITALS:  Blood pressure 87/49, pulse 67, temperature 98.1 F (36.7 C), temperature source Oral, resp. rate 16, height 5' 6"  (1.676 m), weight 47.31 kg (104 lb 4.8 oz), last menstrual period 07/28/2015, SpO2 97 %.  PHYSICAL EXAMINATION:  GENERAL:  30 y.o.-year-old thin patient lying in the bed with no acute distress.  EYES: Pupils equal, round, reactive to light and accommodation. No scleral icterus. Extraocular muscles intact.  HEENT: Head atraumatic, normocephalic. Oropharynx and nasopharynx clear.  NECK:  Supple, no jugular venous distention. No thyroid  enlargement, no tenderness.  LUNGS: Normal breath sounds bilaterally, no wheezing, rales,rhonchi or crepitation. No use of accessory muscles of respiration.  CARDIOVASCULAR: S1, S2 normal. No murmurs, rubs, or gallops.  ABDOMEN: Soft, mild tender, mild distended. Bowel sounds present. No organomegaly or mass. Left upper quadrant PEG tube present. Mild tenderness around the PEG tube, no drainage or much redness around on the skin. EXTREMITIES: No pedal edema, cyanosis, or clubbing.  NEUROLOGIC: Cranial nerves II through XII are intact. Muscle strength 5/5 in all extremities. Sensation intact. Gait not checked. No signs of agitation or shaking. PSYCHIATRIC: The patient is alert and oriented x 3.  SKIN: No obvious rash, lesion, or ulcer.   Physical Exam LABORATORY PANEL:   CBC  Recent Labs Lab 10/19/15 0558  WBC 7.0  HGB 8.6*  HCT 26.5*  PLT 79*   ------------------------------------------------------------------------------------------------------------------  Chemistries   Recent Labs Lab 10/19/15 0558  NA 138  K 3.5  CL 109  CO2 25  GLUCOSE 104*  BUN 5*  CREATININE 0.31*  CALCIUM 7.4*  AST 77*  ALT 25  ALKPHOS 206*  BILITOT 1.5*   ------------------------------------------------------------------------------------------------------------------  Cardiac Enzymes  Recent Labs Lab 10/18/15 1045  TROPONINI 0.03   ------------------------------------------------------------------------------------------------------------------  RADIOLOGY:  US Paracentesis  10/19/2015  ADDENDUM REPORT: 10/19/2015 18:02 ADDENDUM: Correction: 1% lidocaine was used for local anesthetic. Electronically Signed   By: Markus Daft M.D.   On: 10/19/2015 18:02  10/19/2015  INDICATION: Fever and ascites. EXAM: ULTRASOUND GUIDED PARACENTESIS MEDICATIONS: None. COMPLICATIONS: None immediate. PROCEDURE: Informed written consent was obtained from the patient after a discussion of the risks, benefits  and alternatives to treatment. A timeout was  performed prior to the initiation of the procedure. Initial ultrasound scanning demonstrates a moderate amount of ascites within the right lower abdominal quadrant. The right lower abdomen was prepped and draped in the usual sterile fashion. 1% lidocaine with epinephrine was used for local anesthesia. Following this, a 19 gauge, 7-cm, Yueh catheter was introduced. An ultrasound image was saved for documentation purposes. The paracentesis was performed. The catheter was removed and a dressing was applied. The patient tolerated the procedure well without immediate post procedural complication. FINDINGS: A total of approximately 920 mL of yellow fluid was removed. Samples were sent to the laboratory as requested by the clinical team. IMPRESSION: Successful ultrasound-guided paracentesis yielding 920 mL of peritoneal fluid. Electronically Signed: By: Markus Daft M.D. On: 10/19/2015 15:39    ASSESSMENT AND PLAN:   Principal Problem:   Substance induced mood disorder (HCC) Active Problems:   Abdominal pain, generalized   Alcoholic hepatitis with ascites   Portal vein thrombosis   Alcohol abuse   Noncompliance   Ascites   Portal hypertension (HCC)  * Pneumonia: Seen on recent chest x-ray left lower lobe. continue Levaquin. Now getting more hypotensive than usual for last 48 hrs (120->90->80s)  * Portal Vein thrombosis Ruled out   MRI does not show thrombosis- stopped all anticoagulants.  * Ascites with fever   1st fever on 10/17/15 night   Have some pain in abdomen   Status post US guided paracentesis - Successful ultrasound-guided paracentesis yielding 920 mL of peritoneal fluid. No signs of infection.    * Alcoholic hepatitis and ascites GI consult for further management.     MRCP- with no clear obstructions.   Have some pain around PEG site, and she is not using that .   * Chronic alcoholism: Has decompensated liver disease from alcohol  abuse Last drink was before admission, she had alcohol withdrawal and seizures in the past keep monitoring for withdrawal signs, none yet. Appreciate psychiatry input - started mirtazapine 15 mg at night.  Recommended not to stop the clonazepam but decreased the dose down to the 0.25 mg twice a day  motherTye [id number] to explain plan.  * Chronic malnutrition This is secondary to chronic alcoholism, continue dietary supplemented.   Dietician explained about supplements- she is non compliant.  * Smoking Counseled to quit smoking for 4 minutes, offered nicotine patch.   Considering her severe decompensated liver disease from alcohol abuse and severe protein calorie malnutrition - her overall prognosis is very poor.  She understands this and was recommended outpatient follow-up with home health transition to hospice as appropriate.  Social worker/ care management aware  All the records are reviewed and case discussed with Care Management/Social Worker. Management plans discussed with the patient, family and they are in agreement.  CODE STATUS: Full  TOTAL TIME TAKING CARE OF THIS PATIENT: 35 minutes.    POSSIBLE D/C IN 1-2 days, DEPENDING ON CLINICAL CONDITION.  Hoping to see her blood pressure improve   [facility], Trinady Milewski M.D on 10/21/2015   Between 7am to 6pm - Pager - [phone]  After 6pm go to [email or web address] - password [email or web address] [place] [facility]  Office  [phone]  CC: Primary care physician; Leonides Sake, MD  Note: This dictation was prepared with Dragon dictation along with smaller phrase technology. Any transcriptional errors that result from this process are unintentional.

## 2015-10-22 LAB — BASIC METABOLIC PANEL
ANION GAP: 3 — AB (ref 5–15)
BUN: 14 mg/dL (ref 6–20)
CALCIUM: 8.2 mg/dL — AB (ref 8.9–10.3)
CO2: 26 mmol/L (ref 22–32)
Chloride: 106 mmol/L (ref 101–111)
Creatinine, Ser: 0.34 mg/dL — ABNORMAL LOW (ref 0.44–1.00)
Glucose, Bld: 100 mg/dL — ABNORMAL HIGH (ref 65–99)
Potassium: 4 mmol/L (ref 3.5–5.1)
SODIUM: 135 mmol/L (ref 135–145)

## 2015-10-22 LAB — CBC
HEMATOCRIT: 28.4 % — AB (ref 35.0–47.0)
Hemoglobin: 9.4 g/dL — ABNORMAL LOW (ref 12.0–16.0)
MCH: 36.2 pg — ABNORMAL HIGH (ref 26.0–34.0)
MCHC: 33 g/dL (ref 32.0–36.0)
MCV: 109.6 fL — ABNORMAL HIGH (ref 80.0–100.0)
PLATELETS: 172 10*3/uL (ref 150–440)
RBC: 2.59 MIL/uL — ABNORMAL LOW (ref 3.80–5.20)
RDW: 17.9 % — AB (ref 11.5–14.5)
WBC: 6.3 10*3/uL (ref 3.6–11.0)

## 2015-10-22 MED ORDER — LEVOFLOXACIN 750 MG PO TABS: 750.0000 mg | ORAL_TABLET | Freq: Every day | ORAL | Status: DC

## 2015-10-22 MED ORDER — CLONAZEPAM 0.5 MG PO TABS: 0.2500 mg | ORAL_TABLET | Freq: Two times a day (BID) | ORAL | Status: DC

## 2015-10-22 MED ORDER — MIRTAZAPINE 15 MG PO TABS: 15.0000 mg | ORAL_TABLET | Freq: Every day | ORAL | Status: DC

## 2015-10-22 NOTE — Progress Notes (Signed)
GI Inpatient Follow-up Note  Patient Identification: Kristine Macias is a 30 y.o. female with decomp ETOH liver disease, severe malnutrition  Subjective:  Less abd swelling today. No n/v. No f/c.  Cough and SOB better.     Review of Systems: Constitutional: Weight is stable.  Eyes: No changes in vision. ENT: No oral lesions, sore throat.  GI: see HPI.  Heme/Lymph: + easy bruising.  CV: No chest pain.  GU: No hematuria.  Integumentary: No rashes.  Neuro: No headaches.  Psych: + depression/anxiety.  Endocrine: No heat/cold intolerance.  Allergic/Immunologic: No urticaria.  Resp: + cough, SOB.  Musculoskeletal: No joint swelling.    Physical Examination: BP 118/77 mmHg  Pulse 73  Temp(Src) 98.3 F (36.8 C) (Oral)  Resp 20  Ht 5' 6"  (1.676 m)  Wt 47.31 kg (104 lb 4.8 oz)  BMI 16.84 kg/m2  SpO2 99%  LMP 07/28/2015 (Approximate) Gen: NAD, alert and oriented x 4, + emaciated Neck: supple, no JVD or thyromegaly Chest: CTA bilaterally, no wheezes, crackles, or other adventitious sounds CV: RRR, no m/g/c/r Abd: soft, NT, ND, +BS in all four quadrants; no HSM, guarding, ridigity, or rebound tenderness, old PEG site c/d/i Ext: no edema, well perfused with 2+ pulses, Skin: no rash or lesions noted Lymph: no LAD  Data: Lab Results  Component Value Date   WBC 6.3 10/22/2015   HGB 9.4* 10/22/2015   HCT 28.4* 10/22/2015   MCV 109.6* 10/22/2015   PLT 172 10/22/2015    Recent Labs Lab 10/17/15 0541 10/19/15 0558 10/22/15 0630  HGB 8.7* 8.6* 9.4*   Lab Results  Component Value Date   NA 135 10/22/2015   K 4.0 10/22/2015   CL 106 10/22/2015   CO2 26 10/22/2015   BUN 14 10/22/2015   CREATININE 0.34* 10/22/2015   Lab Results  Component Value Date   ALT 25 10/19/2015   AST 77* 10/19/2015   ALKPHOS 206* 10/19/2015   BILITOT 1.5* 10/19/2015    Recent Labs Lab 10/17/15 0541  INR 1.39   Assessment/Plan: Kristine Macias is a 30 y.o. female with decomp ETOH  liver disease and severe malnutrition.  I had a very long discussion today over 30 min with pt and husband. They understand the gravity of situation and that she will likely die soon if she does not stop drinking ETOH and does not improve her nutrition drastically  Recommendations: - no ETOH ever again - low salt diet - Very high protein, high calorie diet with weight gain.  - GI clinic f/u.   Please call with questions or concerns.  REIN, Grace Blight, MD

## 2015-10-22 NOTE — Care Management Note (Signed)
Case Management Note  Patient Details  Name: Kristine Macias MRN: [id number] Date of Birth: 1986/05/19  Subjective/Objective:     A home health referral was called and faxed to [facility] today requesting home health RN and SW.                Action/Plan:   Expected Discharge Date:                  Expected Discharge Plan:     In-House Referral:     Discharge planning Services     Post Acute Care Choice:    Choice offered to:     DME Arranged:    DME Agency:     HH Arranged:    [place] Agency:     Status of Service:     Medicare Important Message Given:    Date Medicare IM Given:    Medicare IM give by:    Date Additional Medicare IM Given:    Additional Medicare Important Message give by:     If discussed at [place] of Stay Meetings, dates discussed:    Additional Comments:  Jahree Dermody A, RN 10/22/2015, 2:00 PM

## 2015-10-22 NOTE — Care Management Note (Addendum)
Case Management Note  Patient Details  Name: Chasiti Waddington MRN: [id number] Date of Birth: September 09, 1985  Subjective/Objective:        Referral for home health RN and SW was called and faxed to Cinda Quest at [facility]. Ms Streat is already an open client of [facility] for nursing services. .             Action/Plan:   Expected Discharge Date:                  Expected Discharge Plan:     In-House Referral:     Discharge planning Services     Post Acute Care Choice:    Choice offered to:     DME Arranged:    DME Agency:     HH Arranged:    [place] Agency:     Status of Service:     Medicare Important Message Given:    Date Medicare IM Given:    Medicare IM give by:    Date Additional Medicare IM Given:    Additional Medicare Important Message give by:     If discussed at [place] of Stay Meetings, dates discussed:    Additional Comments:  Jeevan Kalla A, RN 10/22/2015, 12:03 PM

## 2015-10-22 NOTE — Discharge Summary (Signed)
[facility] [place] NAME: Kristine Macias    MR#:  [id number]  DATE OF BIRTH:  1986-01-11  DATE OF ADMISSION:  10/14/2015 ADMITTING PHYSICIAN: Vaughan Basta, MD  DATE OF DISCHARGE: 10/22/2015  PRIMARY CARE PHYSICIAN: Leonides Sake, MD    ADMISSION DIAGNOSIS:  Portal vein thrombosis [I81] Hepatitis [K75.9] Alcohol dependence with uncomplicated withdrawal ([place]) [F10.230] Ascites [R18.8]  DISCHARGE DIAGNOSIS:  Principal Problem:   Substance induced mood disorder (HCC) Active Problems:   Abdominal pain, generalized   Alcoholic hepatitis with ascites   Portal vein thrombosis   Alcohol abuse   Noncompliance   Ascites   Portal hypertension ([place])   SECONDARY DIAGNOSIS:   Past Medical History  Diagnosis Date  . Depression with anxiety initally at age 33  . Chlamydia 2007    bacterial vaginosis 09/2011  . Irregular heart beat 2010    wt/diet related after evaluation  . Renal disorder   . Pneumothorax, spontaneous, tension   . Alcohol abuse 11/2014    drinking since age 78. chronic, recurrent. at least 2 detox admits before 2015.   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   . Failure to thrive in adult 12/2014.     Malnutrition: n/v, not eating, weight loss, BMI 14.  s/p 01/11/2015 PEG (Dr Dorna Leitz).   . Alcoholic hepatitis 11/5623    hepatic steatosis on 11/2014 ultrasound.   . Seizure ([place]) 06/2015    due to ETOH/benzo withdrawal.   . Thrombocytopenia ([place]) 04/2007  . Colitis 12/2014    colonoscopy for diarrhea and abnml CT 01/06/15: erythmatous TI (path: active ileitis, ? emerging IBD), rectal erythema (path: mucosal prolapse). Random bx of normal colon (path unremarkable)   . Spontaneous pneumothorax 08/2012    right.  chest tube placed.     HOSPITAL COURSE:    30 year old female with a history of EtOH abuse and depression presented with ascites. For further details please refer the H&P.  * Pneumonia:  Seen on recent chest x-ray left lower lobe.  She was started on Levaquin and will be discharged on Levaquin for 3 more days. Her blood pressures at times were low. Her blood pressure has remained stable at systolic 63S to 937.   * Ascites with fever  1st fever on 10/17/15 night  Have some pain in abdomen  Status post US guided paracentesis - Successful ultrasound-guided paracentesis yielding 920 mL of peritoneal fluid. No signs of infection.  Portal vein thrombosis was ruled out with MRI.   * Alcoholic hepatitis and ascites  MRCP was performed with no clear obstruction.  Encouraged to stop using EtOH. She underwent ultrasound-guided paracentesis with 920 mL of peritoneal fluid   * Chronic alcoholism: Has decompensated liver disease from alcohol abuse She was on CIWA protocol with an uneventful detoxification.   Appreciate psychiatry input - started mirtazapine 15 mg at night. Recommended not to stop the clonazepam but decreased the dose down to the 0.25 mg twice a day   * Chronic protein calorie malnutrition This is secondary to chronic alcoholism, continue dietary supplemented.  Dietician explained about supplements- she is non compliant. She has a PEG tube which she does not use * Smoking dependence: Patient was counseled on admission.   DISCHARGE CONDITIONS AND DIET:   Stable condition on a low sodium diet  CONSULTS OBTAINED:  Treatment Team:  Josefine Class, MD Gonzella Lex, MD  DRUG ALLERGIES:   Allergies  Allergen  Reactions  . Aspirin Shortness Of Breath  . Effexor [Venlafaxine] Anaphylaxis  . Sulfa Antibiotics Anaphylaxis  . Tetracyclines & Related Anaphylaxis  . Trazodone And Nefazodone Anaphylaxis  . Latex Hives and Itching  . Paxil [Paroxetine] Hives and Itching  . Seroquel [Quetiapine Fumarate] Hives and Itching  . Zoloft [Sertraline Hcl] Hives and Itching    DISCHARGE MEDICATIONS:   Current Discharge Medication List     START taking these medications   Details  mirtazapine (REMERON) 15 MG tablet Take 1 tablet (15 mg total) by mouth at bedtime. Qty: 30 tablet, Refills: 0      CONTINUE these medications which have CHANGED   Details  clonazePAM (KLONOPIN) 0.5 MG tablet Take 0.5 tablets (0.25 mg total) by mouth 2 (two) times daily. Qty: 30 tablet, Refills: 0    levofloxacin (LEVAQUIN) 750 MG tablet Take 1 tablet (750 mg total) by mouth daily. Qty: 4 tablet, Refills: 0      CONTINUE these medications which have NOT CHANGED   Details  Calcium Carb-Cholecalciferol (CALCIUM 500 +D) 500-400 MG-UNIT TABS Take 1 tablet by mouth daily.    folic acid (FOLVITE) 1 MG tablet Take 1 tablet (1 mg total) by mouth daily. Qty: 30 tablet, Refills: 0    Magnesium Oxide 400 (240 MG) MG TABS Take 400 mg by mouth 2 (two) times daily. Qty: 60 tablet, Refills: 0    Multiple Vitamin (MULTIVITAMIN WITH MINERALS) TABS tablet Take 1 tablet by mouth daily.    omeprazole (PRILOSEC) 20 MG capsule Take 20 mg by mouth daily.    ondansetron (ZOFRAN) 4 MG tablet Take 4 mg by mouth every 6 (six) hours as needed for nausea or vomiting.     Potassium 99 MG TABS Take 99 mg by mouth daily.    thiamine 100 MG tablet Take 1 tablet (100 mg total) by mouth daily. Qty: 30 tablet, Refills: 0    feeding supplement (BOOST / RESOURCE BREEZE) LIQD Take 1 Container by mouth 3 (three) times daily between meals. Refills: 0      STOP taking these medications     Water For Irrigation, Sterile (FREE WATER) SOLN               Today   CHIEF COMPLAINT:   No acute events overnight.   VITAL SIGNS:  Blood pressure 97/65, pulse 74, temperature 98.4 F (36.9 C), temperature source Oral, resp. rate 20, height 5' 6"  (1.676 m), weight 47.31 kg (104 lb 4.8 oz), last menstrual period 07/28/2015, SpO2 98 %.   REVIEW OF SYSTEMS:  Review of Systems  Constitutional: Negative for fever, chills and malaise/fatigue.  HENT: Negative for sore  throat.   Eyes: Negative for blurred vision.  Respiratory: Negative for cough, hemoptysis, shortness of breath and wheezing.   Cardiovascular: Negative for chest pain, palpitations and leg swelling.  Gastrointestinal: Negative for nausea, vomiting, abdominal pain, diarrhea and blood in stool.  Genitourinary: Negative for dysuria.  Musculoskeletal: Negative for back pain.  Neurological: Negative for dizziness, tremors and headaches.  Endo/Heme/Allergies: Does not bruise/bleed easily.     PHYSICAL EXAMINATION:  GENERAL:  30 y.o.-year-old patient lying in the bed with no acute distress. Frail and cachectic NECK:  Supple, no jugular venous distention. No thyroid enlargement, no tenderness.  LUNGS: Normal breath sounds bilaterally, no wheezing, rales,rhonchi  No use of accessory muscles of respiration.  CARDIOVASCULAR: S1, S2 normal. No murmurs, rubs, or gallops.  ABDOMEN: Soft, non-tender, non-distended. Bowel sounds present. No organomegaly or mass. PEG tube  placed EXTREMITIES: No pedal edema, cyanosis, or clubbing.  PSYCHIATRIC: The patient is alert and oriented x 3.  SKIN: No obvious rash, lesion, or ulcer.   DATA REVIEW:   CBC  Recent Labs Lab 10/22/15 0630  WBC 6.3  HGB 9.4*  HCT 28.4*  PLT 172    Chemistries   Recent Labs Lab 10/19/15 0558 10/22/15 0630  NA 138 135  K 3.5 4.0  CL 109 106  CO2 25 26  GLUCOSE 104* 100*  BUN 5* 14  CREATININE 0.31* 0.34*  CALCIUM 7.4* 8.2*  AST 77*  --   ALT 25  --   ALKPHOS 206*  --   BILITOT 1.5*  --     Cardiac Enzymes  Recent Labs Lab 10/18/15 1045  TROPONINI 0.03    Microbiology Results  @[email or web address] @  RADIOLOGY:  No results found.    Management plans discussed with the patient and she is in agreement. Stable for discharge home  Patient should follow up with GI in 4 weeks  CODE STATUS:     Code Status Orders        Start     Ordered   10/14/15 2032  Full code   Continuous     10/14/15 2031     Code Status History    Date Active Date Inactive Code Status Order ID Comments User Context   10/05/2015  3:34 AM 10/05/2015  5:53 PM Full Code [id number]  Lance Coon, MD ED   08/08/2015  5:52 PM 08/10/2015  8:21 PM Full Code [id number]  Elmarie Shiley, MD Inpatient   07/09/2015  6:11 PM 07/14/2015  5:56 PM Full Code [id number]  Theodis Blaze, MD Inpatient   07/01/2015 12:32 AM 07/02/2015  5:52 PM Full Code [id number]  Lytle Butte, MD ED   06/21/2015 12:25 PM 06/22/2015  4:08 PM Full Code [id number]  Epifanio Lesches, MD ED   05/20/2015  7:56 PM 05/23/2015  3:55 PM Full Code [id number]  Fritzi Mandes, MD Inpatient   04/16/2015  9:21 PM 04/18/2015  4:55 PM Full Code [id number]  Idelle Crouch, MD Inpatient   03/01/2015  8:52 PM 03/03/2015  4:05 PM Full Code [id number]  Lytle Butte, MD ED   01/04/2015  7:23 PM 01/13/2015  7:12 PM Full Code [id number]  Fritzi Mandes, MD ED   11/24/2014  6:21 PM 11/26/2014  1:29 PM Full Code [id number]  Serita Grit, MD ED      TOTAL TIME TAKING CARE OF THIS PATIENT: 35 minutes.    Note: This dictation was prepared with Dragon dictation along with smaller phrase technology. Any transcriptional errors that result from this process are unintentional.  Tameron Lama M.D on 10/22/2015 at 10:27 AM  Between 7am to 6pm - Pager - [phone] After 6pm go to [email or web address] - password [email or web address] [facility]  [facility]  Office  [phone]  CC: Primary care physician; Leonides Sake, MD

## 2015-10-23 LAB — BODY FLUID CULTURE: Culture: NO GROWTH

## 2015-10-28 ENCOUNTER — Other Ambulatory Visit
Admission: RE | Admit: 2015-10-28 | Discharge: 2015-10-28 | Disposition: A | Discharge status: Home / Self Care | Payer: Medicaid Other | Source: Ambulatory Visit | Attending: Family Medicine | Admitting: Family Medicine

## 2015-10-28 DIAGNOSIS — K7010 Alcoholic hepatitis without ascites: Secondary | ICD-10-CM | POA: Diagnosis present

## 2015-10-28 LAB — CBC WITH DIFFERENTIAL/PLATELET
BASOS ABS: 0.2 10*3/uL — AB (ref 0–0.1)
BASOS PCT: 1 %
EOS ABS: 0.1 10*3/uL (ref 0–0.7)
Eosinophils Relative: 1 %
HEMATOCRIT: 31.8 % — AB (ref 35.0–47.0)
HEMOGLOBIN: 10.7 g/dL — AB (ref 12.0–16.0)
Lymphocytes Relative: 25 %
Lymphs Abs: 2.9 10*3/uL (ref 1.0–3.6)
MCH: 36.4 pg — ABNORMAL HIGH (ref 26.0–34.0)
MCHC: 33.6 g/dL (ref 32.0–36.0)
MCV: 108.6 fL — ABNORMAL HIGH (ref 80.0–100.0)
Monocytes Absolute: 0.9 10*3/uL (ref 0.2–0.9)
Monocytes Relative: 8 %
NEUTROS ABS: 7.4 10*3/uL — AB (ref 1.4–6.5)
NEUTROS PCT: 65 %
Platelets: 404 10*3/uL (ref 150–440)
RBC: 2.92 MIL/uL — ABNORMAL LOW (ref 3.80–5.20)
RDW: 17.2 % — AB (ref 11.5–14.5)
WBC: 11.5 10*3/uL — ABNORMAL HIGH (ref 3.6–11.0)

## 2015-10-28 LAB — COMPREHENSIVE METABOLIC PANEL
ALBUMIN: 2.9 g/dL — AB (ref 3.5–5.0)
ALK PHOS: 90 U/L (ref 38–126)
ALT: 26 U/L (ref 14–54)
ANION GAP: 6 (ref 5–15)
AST: 45 U/L — AB (ref 15–41)
BILIRUBIN TOTAL: 0.7 mg/dL (ref 0.3–1.2)
BUN: 17 mg/dL (ref 6–20)
CALCIUM: 9 mg/dL (ref 8.9–10.3)
CO2: 29 mmol/L (ref 22–32)
Chloride: 102 mmol/L (ref 101–111)
Creatinine, Ser: 0.34 mg/dL — ABNORMAL LOW (ref 0.44–1.00)
Glucose, Bld: 78 mg/dL (ref 65–99)
Potassium: 4.2 mmol/L (ref 3.5–5.1)
SODIUM: 137 mmol/L (ref 135–145)
TOTAL PROTEIN: 6.8 g/dL (ref 6.5–8.1)

## 2015-10-28 LAB — BILIRUBIN, DIRECT: BILIRUBIN DIRECT: 0.3 mg/dL (ref 0.1–0.5)

## 2015-11-01 ENCOUNTER — Other Ambulatory Visit: Payer: Self-pay | Admitting: Gastroenterology

## 2015-11-01 DIAGNOSIS — R188 Other ascites: Secondary | ICD-10-CM

## 2015-11-03 ENCOUNTER — Ambulatory Visit
Admission: RE | Admit: 2015-11-03 | Discharge: 2015-11-03 | Disposition: A | Discharge status: Home / Self Care | Payer: Medicaid Other | Source: Ambulatory Visit | Attending: Gastroenterology | Admitting: Gastroenterology

## 2015-11-03 ENCOUNTER — Other Ambulatory Visit: Payer: Self-pay | Admitting: Gastroenterology

## 2015-11-03 DIAGNOSIS — K828 Other specified diseases of gallbladder: Secondary | ICD-10-CM | POA: Insufficient documentation

## 2015-11-03 DIAGNOSIS — R109 Unspecified abdominal pain: Secondary | ICD-10-CM

## 2015-11-03 DIAGNOSIS — R188 Other ascites: Secondary | ICD-10-CM | POA: Diagnosis not present

## 2015-12-07 ENCOUNTER — Encounter: Payer: Self-pay | Admitting: *Deleted

## 2015-12-07 ENCOUNTER — Emergency Department: Payer: Medicaid Other

## 2015-12-07 ENCOUNTER — Inpatient Hospital Stay
Admission: EM | Admit: 2015-12-07 | Discharge: 2015-12-10 | DRG: 781 | Disposition: A | Discharge status: Home / Self Care | Payer: Medicaid Other | Attending: Internal Medicine | Admitting: Internal Medicine

## 2015-12-07 DIAGNOSIS — R32 Unspecified urinary incontinence: Secondary | ICD-10-CM | POA: Diagnosis present

## 2015-12-07 DIAGNOSIS — O99321 Drug use complicating pregnancy, first trimester: Principal | ICD-10-CM | POA: Diagnosis present

## 2015-12-07 DIAGNOSIS — K589 Irritable bowel syndrome without diarrhea: Secondary | ICD-10-CM | POA: Diagnosis present

## 2015-12-07 DIAGNOSIS — F411 Generalized anxiety disorder: Secondary | ICD-10-CM | POA: Diagnosis present

## 2015-12-07 DIAGNOSIS — F10239 Alcohol dependence with withdrawal, unspecified: Secondary | ICD-10-CM

## 2015-12-07 DIAGNOSIS — O26611 Liver and biliary tract disorders in pregnancy, first trimester: Secondary | ICD-10-CM | POA: Diagnosis present

## 2015-12-07 DIAGNOSIS — E871 Hypo-osmolality and hyponatremia: Secondary | ICD-10-CM | POA: Diagnosis present

## 2015-12-07 DIAGNOSIS — Z882 Allergy status to sulfonamides status: Secondary | ICD-10-CM

## 2015-12-07 DIAGNOSIS — F1020 Alcohol dependence, uncomplicated: Secondary | ICD-10-CM | POA: Diagnosis not present

## 2015-12-07 DIAGNOSIS — O99341 Other mental disorders complicating pregnancy, first trimester: Secondary | ICD-10-CM | POA: Diagnosis present

## 2015-12-07 DIAGNOSIS — R569 Unspecified convulsions: Secondary | ICD-10-CM | POA: Diagnosis present

## 2015-12-07 DIAGNOSIS — Z886 Allergy status to analgesic agent status: Secondary | ICD-10-CM

## 2015-12-07 DIAGNOSIS — Z3A01 Less than 8 weeks gestation of pregnancy: Secondary | ICD-10-CM

## 2015-12-07 DIAGNOSIS — Z888 Allergy status to other drugs, medicaments and biological substances status: Secondary | ICD-10-CM

## 2015-12-07 DIAGNOSIS — O99351 Diseases of the nervous system complicating pregnancy, first trimester: Secondary | ICD-10-CM | POA: Diagnosis present

## 2015-12-07 DIAGNOSIS — Z681 Body mass index (BMI) 19 or less, adult: Secondary | ICD-10-CM | POA: Diagnosis not present

## 2015-12-07 DIAGNOSIS — Z331 Pregnant state, incidental: Secondary | ICD-10-CM | POA: Diagnosis present

## 2015-12-07 DIAGNOSIS — O2511 Malnutrition in pregnancy, first trimester: Secondary | ICD-10-CM | POA: Diagnosis present

## 2015-12-07 DIAGNOSIS — F329 Major depressive disorder, single episode, unspecified: Secondary | ICD-10-CM | POA: Diagnosis present

## 2015-12-07 DIAGNOSIS — O99331 Smoking (tobacco) complicating pregnancy, first trimester: Secondary | ICD-10-CM | POA: Diagnosis present

## 2015-12-07 DIAGNOSIS — F17210 Nicotine dependence, cigarettes, uncomplicated: Secondary | ICD-10-CM | POA: Diagnosis present

## 2015-12-07 DIAGNOSIS — E43 Unspecified severe protein-calorie malnutrition: Secondary | ICD-10-CM | POA: Diagnosis present

## 2015-12-07 DIAGNOSIS — K7460 Unspecified cirrhosis of liver: Secondary | ICD-10-CM | POA: Diagnosis present

## 2015-12-07 DIAGNOSIS — Z3491 Encounter for supervision of normal pregnancy, unspecified, first trimester: Secondary | ICD-10-CM

## 2015-12-07 DIAGNOSIS — E860 Dehydration: Secondary | ICD-10-CM | POA: Diagnosis present

## 2015-12-07 DIAGNOSIS — O99281 Endocrine, nutritional and metabolic diseases complicating pregnancy, first trimester: Secondary | ICD-10-CM | POA: Diagnosis present

## 2015-12-07 DIAGNOSIS — F10230 Alcohol dependence with withdrawal, uncomplicated: Secondary | ICD-10-CM | POA: Diagnosis present

## 2015-12-07 DIAGNOSIS — F10939 Alcohol use, unspecified with withdrawal, unspecified: Secondary | ICD-10-CM

## 2015-12-07 DIAGNOSIS — Z91040 Latex allergy status: Secondary | ICD-10-CM

## 2015-12-07 LAB — BASIC METABOLIC PANEL
Anion gap: 11 (ref 5–15)
BUN: 14 mg/dL (ref 6–20)
CO2: 24 mmol/L (ref 22–32)
CREATININE: 0.45 mg/dL (ref 0.44–1.00)
Calcium: 8.6 mg/dL — ABNORMAL LOW (ref 8.9–10.3)
Chloride: 97 mmol/L — ABNORMAL LOW (ref 101–111)
GFR calc Af Amer: >60 mL/min (ref >60)
Glucose, Bld: 110 mg/dL — ABNORMAL HIGH (ref 65–99)
POTASSIUM: 3.5 mmol/L (ref 3.5–5.1)
SODIUM: 132 mmol/L — AB (ref 135–145)

## 2015-12-07 LAB — URINE DRUG SCREEN, QUALITATIVE (ARMC ONLY)
AMPHETAMINES, UR SCREEN: NOT DETECTED
BENZODIAZEPINE, UR SCRN: POSITIVE — AB
Barbiturates, Ur Screen: NOT DETECTED
Cannabinoid 50 Ng, Ur ~~LOC~~: NOT DETECTED
Cocaine Metabolite,Ur ~~LOC~~: NOT DETECTED
MDMA (Ecstasy)Ur Screen: NOT DETECTED
METHADONE SCREEN, URINE: NOT DETECTED
Opiate, Ur Screen: NOT DETECTED
PHENCYCLIDINE (PCP) UR S: NOT DETECTED
TRICYCLIC, UR SCREEN: NOT DETECTED

## 2015-12-07 LAB — POCT PREGNANCY, URINE: Preg Test, Ur: POSITIVE — AB

## 2015-12-07 LAB — CBC WITH DIFFERENTIAL/PLATELET
BASOS ABS: 0 10*3/uL (ref 0–0.1)
BASOS PCT: 1 %
EOS ABS: 0 10*3/uL (ref 0–0.7)
Eosinophils Relative: 1 %
HEMATOCRIT: 24.7 % — AB (ref 35.0–47.0)
Hemoglobin: 8.1 g/dL — ABNORMAL LOW (ref 12.0–16.0)
Lymphocytes Relative: 23 %
Lymphs Abs: 0.9 10*3/uL — ABNORMAL LOW (ref 1.0–3.6)
MCH: 30.9 pg (ref 26.0–34.0)
MCHC: 32.9 g/dL (ref 32.0–36.0)
MCV: 94 fL (ref 80.0–100.0)
MONO ABS: 0.3 10*3/uL (ref 0.2–0.9)
Monocytes Relative: 8 %
NEUTROS ABS: 2.6 10*3/uL (ref 1.4–6.5)
Neutrophils Relative %: 67 %
PLATELETS: 128 10*3/uL — AB (ref 150–440)
RBC: 2.62 MIL/uL — ABNORMAL LOW (ref 3.80–5.20)
RDW: 15.9 % — AB (ref 11.5–14.5)
WBC: 3.8 10*3/uL (ref 3.6–11.0)

## 2015-12-07 LAB — URINALYSIS COMPLETE WITH MICROSCOPIC (ARMC ONLY)
Bilirubin Urine: NEGATIVE
Glucose, UA: NEGATIVE mg/dL
Hgb urine dipstick: NEGATIVE
KETONES UR: NEGATIVE mg/dL
Leukocytes, UA: NEGATIVE
Nitrite: NEGATIVE
PH: 6 (ref 5.0–8.0)
PROTEIN: NEGATIVE mg/dL
RBC / HPF: NONE SEEN RBC/hpf (ref 0–5)
Specific Gravity, Urine: 1.003 — ABNORMAL LOW (ref 1.005–1.030)

## 2015-12-07 LAB — HCG, QUANTITATIVE, PREGNANCY: hCG, Beta Chain, Quant, S: 126633 m[IU]/mL — ABNORMAL HIGH (ref <5)

## 2015-12-07 LAB — ETHANOL: ALCOHOL ETHYL (B): 288 mg/dL — AB (ref <5)

## 2015-12-07 MED ORDER — LORAZEPAM 2 MG PO TABS
2.0000 mg | ORAL_TABLET | Freq: Once | ORAL | Status: AC
  Administered 2015-12-07: 2 mg via ORAL
  Filled 2015-12-07: qty 1

## 2015-12-07 MED ORDER — FOLIC ACID 1 MG PO TABS
1.0000 mg | ORAL_TABLET | Freq: Every day | ORAL | Status: DC
  Administered 2015-12-08 – 2015-12-10 (×3): 1 mg via ORAL
  Filled 2015-12-07 (×3): qty 1

## 2015-12-07 MED ORDER — SODIUM CHLORIDE 0.9 % IV BOLUS (SEPSIS)
1000.0000 mL | Freq: Once | INTRAVENOUS | Status: AC
  Administered 2015-12-07: 1000 mL via INTRAVENOUS

## 2015-12-07 MED ORDER — LORAZEPAM 1 MG PO TABS
1.0000 mg | ORAL_TABLET | Freq: Four times a day (QID) | ORAL | Status: DC | PRN
  Administered 2015-12-08: 1 mg via ORAL
  Filled 2015-12-07: qty 1

## 2015-12-07 MED ORDER — LORAZEPAM 2 MG/ML IJ SOLN: 1.0000 mg | Freq: Four times a day (QID) | INTRAMUSCULAR | Status: DC | PRN

## 2015-12-07 MED ORDER — THIAMINE HCL 100 MG/ML IJ SOLN
100.0000 mg | Freq: Every day | INTRAMUSCULAR | Status: DC
  Filled 2015-12-07 (×3): qty 1

## 2015-12-07 MED ORDER — VITAMIN B-1 100 MG PO TABS
100.0000 mg | ORAL_TABLET | Freq: Every day | ORAL | Status: DC
  Administered 2015-12-08 – 2015-12-10 (×3): 100 mg via ORAL
  Filled 2015-12-07 (×3): qty 1

## 2015-12-07 MED ORDER — ADULT MULTIVITAMIN W/MINERALS CH
1.0000 | ORAL_TABLET | Freq: Every day | ORAL | Status: DC
  Administered 2015-12-08: 1 via ORAL
  Filled 2015-12-07: qty 1

## 2015-12-07 NOTE — ED Provider Notes (Signed)
[facility] Emergency Department Provider Note   ____________________________________________  Time seen: Approximately 6pm I have reviewed the triage vital signs and the triage nursing note.  HISTORY  Chief Complaint Seizures   Historian Patient  HPI Kristine Macias is a 30 y.o. female with history of alcohol abuse, states that she typically drinks 220 ounce "big boys "which are 8% alcohol daily, in addition to taking Klonopin for anxiety, who reports possibly multiple seizures today. She took a urine pregnancy test yesterday and it was positive and so she stopped drinking alcohol about 24 hours ago. Her significant other walked in and found her actively seizing. She did have urinary incontinence.  She has a history of prior alcohol withdrawal seizures. No history of seizure other than in the setting of alcohol withdrawal.  She has had 5 pregnancy, 2 live births, 3 miscarriages at between 4 and 6 months, which she states was due to "low potassium. "  Due to "malnutrition "patient states she has not had a period in one year.She's not had any abdominal pain, pelvic discharge, or back pains.  No urinary symptoms.     Past Medical History  Diagnosis Date  . Depression with anxiety initally at age 10  . Chlamydia 2007    bacterial vaginosis 09/2011  . Irregular heart beat 2010    wt/diet related after evaluation  . Renal disorder   . Pneumothorax, spontaneous, tension   . Alcohol abuse 11/2014    drinking since age 84. chronic, recurrent. at least 2 detox admits before 2015.   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   . Failure to thrive in adult 12/2014.     Malnutrition: n/v, not eating, weight loss, BMI 14.  s/p 01/11/2015 PEG (Dr Dorna Leitz).   . Alcoholic hepatitis 08/6008    hepatic steatosis on 11/2014 ultrasound.   . Seizure ([place]) 06/2015    due to ETOH/benzo withdrawal.   . Thrombocytopenia ([place]) 04/2007  . Colitis 12/2014    colonoscopy for  diarrhea and abnml CT 01/06/15: erythmatous TI (path: active ileitis, ? emerging IBD), rectal erythema (path: mucosal prolapse). Random bx of normal colon (path unremarkable)   . Spontaneous pneumothorax 08/2012    right.  chest tube placed.     Patient Active Problem List   Diagnosis Date Noted  . Substance induced mood disorder ([place]) 10/18/2015  . Alcohol abuse 10/18/2015  . Noncompliance 10/18/2015  . Ascites 10/18/2015  . Portal hypertension ([place]) 10/18/2015  . 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 CAP (community acquired pneumonia) 10/05/2015  . Hepatitis 08/08/2015  . Nausea & vomiting 08/08/2015  . Alcoholic hepatitis without ascites   . Thrombocytopenia ([place]) 07/11/2015  . Hyponatremia 07/11/2015  . Alcoholic hepatitis with ascites   . Alcoholic hepatitis 93/23/5573  . Alcohol intoxication ([place]) 07/09/2015  . Withdrawal seizures ([place]) 07/01/2015  . 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 Drug withdrawal seizure ([place]) 06/21/2015  . Chest pain 05/20/2015  . GAD (generalized anxiety disorder) 04/17/2015  . Panic disorder 04/17/2015  . Alcohol withdrawal ([place]) 04/16/2015  . Abdominal pain, generalized 04/16/2015  . Vomiting and diarrhea   . Failure to thrive in adult   . Intractable nausea and vomiting 03/01/2015  . Hypokalemia 03/01/2015  . Protein-calorie malnutrition, severe ([place]) 01/05/2015  . Generalized anxiety disorder 11/26/2014    Class: Chronic  . Alcohol use disorder, severe, dependence ([place]) 11/25/2014  . Adnexal mass 04/10/2012    Past Surgical History  Procedure Laterality  Date  . Cesarean section  04/2007, 07/2009     x 2.  G3, Para 2012 as of 07/2009.   Marland Kitchen Chest tube insertion    . Esophagogastroduodenoscopy N/A 01/06/2015    ;[facility], Dr Rayann Heman. For wt loss, N/V: Normal study, duodenal biopsy/pathology: chronic active duodenitis.   . Colonoscopy with propofol N/A 01/06/2015    [facility], Dr Rayann Heman. colonoscopy for diarrhea and abnml CT 01/06/15: erythmatous TI (path:  active ileitis, ? emerging IBD), rectal erythema (path: mucosal prolapse). Random bx of normal colon (path unremarkable)   . Peg placement N/A 01/11/2015    [facility], Dr Rayann Heman.  for N/V/wt loss/severe malnutrition.   . Esophagogastroduodenoscopy N/A 01/11/2015    Rein-normal with PEG placement    Current Outpatient Rx  Name  Route  Sig  Dispense  Refill  . Calcium Carb-Cholecalciferol (CALCIUM 500 +D) 500-400 MG-UNIT TABS   Oral   Take 1 tablet by mouth daily.         . clonazePAM (KLONOPIN) 0.5 MG tablet   Oral   Take 0.5 tablets (0.25 mg total) by mouth 2 (two) times daily.   30 tablet   0   . feeding supplement (BOOST / RESOURCE BREEZE) LIQD   Oral   Take 1 Container by mouth 3 (three) times daily between meals. Patient not taking: Reported on 10/14/2015      0   . folic acid (FOLVITE) 1 MG tablet   Oral   Take 1 tablet (1 mg total) by mouth daily.   30 tablet   0   . levofloxacin (LEVAQUIN) 750 MG tablet   Oral   Take 1 tablet (750 mg total) by mouth daily.   4 tablet   0   . Magnesium Oxide 400 (240 MG) MG TABS   Oral   Take 400 mg by mouth 2 (two) times daily.   60 tablet   0   . mirtazapine (REMERON) 15 MG tablet   Oral   Take 1 tablet (15 mg total) by mouth at bedtime.   30 tablet   0   . Multiple Vitamin (MULTIVITAMIN WITH MINERALS) TABS tablet   Oral   Take 1 tablet by mouth daily.         Marland Kitchen omeprazole (PRILOSEC) 20 MG capsule   Oral   Take 20 mg by mouth daily.         . ondansetron (ZOFRAN) 4 MG tablet   Oral   Take 4 mg by mouth every 6 (six) hours as needed for nausea or vomiting.          . Potassium 99 MG TABS   Oral   Take 99 mg by mouth daily.         Marland Kitchen thiamine 100 MG tablet   Oral   Take 1 tablet (100 mg total) by mouth daily.   30 tablet   0     Allergies Aspirin; Effexor; Sulfa antibiotics; Tetracyclines & related; Trazodone and nefazodone; Latex; Paxil; Seroquel; and Zoloft  Family History  Problem Relation Age of  Onset  . Anesthesia problems Neg Hx   . Thyroid disease Mother   . Cancer Father   . Stroke Other   . Crohn's disease Brother     Social History Social History  Substance Use Topics  . Smoking status: Current Every Day Smoker -- 2.00 packs/day    Types: Cigarettes  . Smokeless tobacco: Never Used  . Alcohol Use: 21.6 oz/week    36 Cans of  beer per week     Comment: 6 beers/day; trying to quit, participating in AA    Review of Systems  Constitutional: Negative for fevers. Eyes: Negative for visual changes. ENT: Negative for sore throat. Cardiovascular: Negative for chest pain. Respiratory: Negative for shortness of breath. Gastrointestinal: Negative for abdominal pain, vomiting and diarrhea. Genitourinary: Negative for dysuria. Musculoskeletal: Negative for back pain. Skin: Negative for rash. Neurological: Negative for headache. 10 point Review of Systems otherwise negative ____________________________________________   PHYSICAL EXAM:  VITAL SIGNS: ED Triage Vitals  Enc Vitals Group     BP 12/07/15 1700 131/94 mmHg     Pulse Rate 12/07/15 1700 88     Resp 12/07/15 1700 16     Temp 12/07/15 1703 98.1 F (36.7 C)     Temp Source 12/07/15 1703 Oral     SpO2 12/07/15 1700 100 %     Weight 12/07/15 1700 105 lb (47.628 kg)     Height 12/07/15 1700 5' 6"  (1.676 m)     Head Cir --      Peak Flow --      Pain Score 12/07/15 1704 8     Pain Loc --      Pain Edu? --      Excl. in [place]? --      Constitutional: Alert and oriented. Well appearing and in no distress. HEENT   Head: Normocephalic and atraumatic.      Eyes: Conjunctivae are Mildly injected. PERRL. Normal extraocular movements.      Ears:         Nose: No congestion/rhinnorhea.   Mouth/Throat: Mucous membranes are moist.   Neck: No stridor. Cardiovascular/Chest: Normal rate, regular rhythm.  No murmurs, rubs, or gallops. Respiratory: Normal respiratory effort without tachypnea nor retractions.  Breath sounds are clear and equal bilaterally. No wheezes/rales/rhonchi. Gastrointestinal: Soft. No distention, no guarding, no rebound. Nontender. No uterine fundus palpated on exam.  Genitourinary/rectal:Deferred Musculoskeletal: Nontender with normal range of motion in all extremities. No joint effusions.  No lower extremity tenderness.  No edema. Neurologic:  Normal speech and language. No gross or focal neurologic deficits are appreciated. Skin:  Skin is warm, dry and intact. No rash noted. Psychiatric: Mood and affect are normal. Speech and behavior are normal. Patient exhibits appropriate insight and judgment.  ____________________________________________   EKG I, Lisa Roca, MD, the attending physician have personally viewed and interpreted all ECGs.  None ____________________________________________  LABS (pertinent positives/negatives)  Urine pregnancy test positive  Beta-hCG pending Alcohol 026 Basic metabolic panel sodium 378, chloride 97 CBC showed a white blood count of 3.8, hemoglobin 8.1 and platelet count 128  ____________________________________________  RADIOLOGY All Xrays were viewed by me. Imaging interpreted by Radiologist.  Ultrasound pelvic and transvaginal:  Single live intrauterine gestation measuring 7 weeks 3 days with heart rate 157 bpm __________________________________________  PROCEDURES  Procedure(s) performed: None  Critical Care performed: None  ____________________________________________   ED COURSE / ASSESSMENT AND PLAN  Pertinent labs & imaging results that were available during my care of the patient were reviewed by me and considered in my medical decision making (see chart for details).  Patient's here after a witnessed seizure, when she's had a prior history of alcohol withdrawal seizures. I am going to go ahead and give her 2 mg of Ativan now, as I do not want her to have an additional seizure as she thinks she's already had  several this morning.   In hopes of trying to get her to  an inpatient detox unit for pregnant patients, I did contact [facility] who is referring her chart for possible transfer.   [facility] unable to accept in transfer due to med/surge beds are full. The perinatal behavioral health intake person is only available from 8 AM to 5 PM.  I did discuss with Dr. Posey Pronto, [facility] for admission.  Labs did come back showing alcohol level 288.    CONSULTATIONS:  [facility] behavioral medicine - unable to accept as there are no beds.  I spoke with [facility] hospitalist for admission.   Patient / Family / Caregiver informed of clinical course, medical decision-making process, and agree with plan.     ___________________________________________   FINAL CLINICAL IMPRESSION(S) / ED DIAGNOSES   Final diagnoses:  Seizure due to alcohol withdrawal, uncomplicated ([place])  First trimester pregnancy              Note: This dictation was prepared with Diplomatic Services operational officer dictation. Any transcriptional errors that result from this process are unintentional   Lisa Roca, MD 12/07/15 2249

## 2015-12-07 NOTE — ED Notes (Signed)
Pt brought in via ems from home.  Pt had a seizure at 1330.  Pt states she has withdrawal sx.  Pt reports finding out she is pregnant yesterday  By doing a home preg test and did not drink etoh today, so she had a seizure.  Pt drinks beer everyday..    Pt takes klonopin for seizures.   Pt alert.  Iv in place.

## 2015-12-07 NOTE — H&P (Signed)
[facility] NAME: Kristine Macias    MR#:  [id number]  DATE OF BIRTH:  07-Feb-1986  DATE OF ADMISSION:  12/07/2015  PRIMARY CARE PHYSICIAN: Leonides Sake, MD   REQUESTING/REFERRING PHYSICIAN: Dr. Reita Cliche  CHIEF COMPLAINT:  Seizures noted by patient's fianc  HISTORY OF PRESENT ILLNESS:  Kristine Macias  is a 30 y.o. female with a known history of alcohol dependence, smoking, depression with anxiety, history of severe malnourishment status post PEG placement which was removed last year comes into the emergency room with seizures that were witnessed by patient's fields at this afternoon around 3:00. Patient has been trying to cut down her alcohol and she was found to have shaking spell with urinary incontinence today. Patient denied any seizures after 3:00pm. She received 1 dose of Ativan. Pt denies use any other drugs. Dr Reita Cliche tried to transfer pt to [facility] under the behavioral/substance abuse program however the coordinator was not available. Pt understands the risks of side effects with ativan since she is pregnant.  PAST MEDICAL HISTORY:   Past Medical History  Diagnosis Date  . Depression with anxiety initally at age 21  . Chlamydia 2007    bacterial vaginosis 09/2011  . Irregular heart beat 2010    wt/diet related after evaluation  . Renal disorder   . Pneumothorax, spontaneous, tension   . Alcohol abuse 11/2014    drinking since age 50. chronic, recurrent. at least 2 detox admits before 2015.   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   . Failure to thrive in adult 12/2014.     Malnutrition: n/v, not eating, weight loss, BMI 14.  s/p 01/11/2015 PEG (Dr Dorna Leitz).   . Alcoholic hepatitis 11/1285    hepatic steatosis on 11/2014 ultrasound.   . Seizure ([place]) 06/2015    due to ETOH/benzo withdrawal.   . Thrombocytopenia ([place]) 04/2007  . Colitis 12/2014    colonoscopy for diarrhea and abnml CT 01/06/15: erythmatous TI  (path: active ileitis, ? emerging IBD), rectal erythema (path: mucosal prolapse). Random bx of normal colon (path unremarkable)   . Spontaneous pneumothorax 08/2012    right.  chest tube placed.     PAST SURGICAL HISTOIRY:   Past Surgical History  Procedure Laterality Date  . Cesarean section  04/2007, 07/2009     x 2.  G3, Para 2012 as of 07/2009.   Marland Kitchen Chest tube insertion    . Esophagogastroduodenoscopy N/A 01/06/2015    ;[facility], Dr Rayann Heman. For wt loss, N/V: Normal study, duodenal biopsy/pathology: chronic active duodenitis.   . Colonoscopy with propofol N/A 01/06/2015    [facility], Dr Rayann Heman. colonoscopy for diarrhea and abnml CT 01/06/15: erythmatous TI (path: active ileitis, ? emerging IBD), rectal erythema (path: mucosal prolapse). Random bx of normal colon (path unremarkable)   . Peg placement N/A 01/11/2015    [facility], Dr Rayann Heman.  for N/V/wt loss/severe malnutrition.   . Esophagogastroduodenoscopy N/A 01/11/2015    Rein-normal with PEG placement    SOCIAL HISTORY:   Social History  Substance Use Topics  . Smoking status: Current Every Day Smoker -- 2.00 packs/day    Types: Cigarettes  . Smokeless tobacco: Never Used  . Alcohol Use: 21.6 oz/week    36 Cans of beer per week     Comment: 6 beers/day; trying to quit, participating in [place]:   Family History  Problem Relation Age of Onset  . Anesthesia  problems Neg Hx   . Thyroid disease Mother   . Cancer Father   . Stroke Other   . Crohn's disease Brother     DRUG ALLERGIES:   Allergies  Allergen Reactions  . Aspirin Shortness Of Breath  . Effexor [Venlafaxine] Anaphylaxis  . Sulfa Antibiotics Anaphylaxis  . Tetracyclines & Related Anaphylaxis  . Trazodone And Nefazodone Anaphylaxis  . Latex Hives and Itching  . Paxil [Paroxetine] Hives and Itching  . Seroquel [Quetiapine Fumarate] Hives and Itching  . Zoloft [Sertraline Hcl] Hives and Itching    REVIEW OF SYSTEMS:  Review of Systems  Constitutional: Negative  for fever, chills and weight loss.  HENT: Negative for ear discharge, ear pain and nosebleeds.   Eyes: Negative for blurred vision, pain and discharge.  Respiratory: Negative for sputum production, shortness of breath, wheezing and stridor.   Cardiovascular: Negative for chest pain, palpitations, orthopnea and PND.  Gastrointestinal: Negative for nausea, vomiting, abdominal pain and diarrhea.  Genitourinary: Negative for urgency and frequency.  Musculoskeletal: Negative for back pain and joint pain.  Neurological: Positive for seizures and weakness. Negative for sensory change, speech change and focal weakness.  Psychiatric/Behavioral: Positive for depression. Negative for hallucinations. The patient is nervous/anxious.   All other systems reviewed and are negative.    MEDICATIONS AT HOME:   Prior to Admission medications   Medication Sig Start Date End Date Taking? Authorizing Provider  clonazePAM (KLONOPIN) 0.25 MG disintegrating tablet Take 0.25 mg by mouth 2 (two) times daily. 11/10/15  Yes Historical Provider, MD      VITAL SIGNS:  Blood pressure 98/73, pulse 86, temperature 98.1 F (36.7 C), temperature source Oral, resp. rate 18, height 5' 6"  (1.676 m), weight 47.628 kg (105 lb), SpO2 98 %.  PHYSICAL EXAMINATION:  GENERAL:  30 y.o.-year-old patient lying in the bed with no acute distress. thin EYES: Pupils equal, round, reactive to light and accommodation. No scleral icterus. Extraocular muscles intact.  HEENT: Head atraumatic, normocephalic. Oropharynx and nasopharynx clear.  NECK:  Supple, no jugular venous distention. No thyroid enlargement, no tenderness.  LUNGS: Normal breath sounds bilaterally, no wheezing, rales,rhonchi or crepitation. No use of accessory muscles of respiration.  CARDIOVASCULAR: S1, S2 normal. No murmurs, rubs, or gallops.  ABDOMEN: Soft, nontender, nondistended. Bowel sounds present. No organomegaly or mass.  EXTREMITIES: No pedal edema, cyanosis, or  clubbing.  NEUROLOGIC: Cranial nerves II through XII are intact. Muscle strength 5/5 in all extremities. Sensation intact. Gait not checked.  PSYCHIATRIC: The patient is alert and oriented x 3.  SKIN: No obvious rash, lesion, or ulcer.   LABORATORY PANEL:   CBC  Recent Labs Lab 12/07/15 1702  WBC 3.8  HGB 8.1*  HCT 24.7*  PLT 128*   ------------------------------------------------------------------------------------------------------------------  Chemistries   Recent Labs Lab 12/07/15 1702  NA 132*  K 3.5  CL 97*  CO2 24  GLUCOSE 110*  BUN 14  CREATININE 0.45  CALCIUM 8.6*   ------------------------------------------------------------------------------------------------------------------  Cardiac Enzymes No results for input(s): TROPONINI in the last 168 hours. ------------------------------------------------------------------------------------------------------------------  RADIOLOGY:  US Ob Comp Less 14 Wks  12/07/2015  CLINICAL DATA:  Pregnant, no LMP for 1 year, malnourishment, pelvic pain for 2 weeks, seizure at 1330 hours, positive pregnancy test, quantitative beta HCG pending EXAM: OBSTETRIC <14 WK [place] AND TRANSVAGINAL OB US TECHNIQUE: Both transabdominal and transvaginal ultrasound examinations were performed for complete evaluation of the gestation as well as the maternal uterus, adnexal regions, and pelvic cul-de-sac. Transvaginal technique was performed to  assess early pregnancy. COMPARISON:  Non FINDINGS: Intrauterine gestational sac: Visualized, normal morphology Yolk sac:  Present Embryo:  Present Cardiac Activity: Present Heart Rate: 157  bpm CRL:  12.1  mm   7 w   3 d                  [place] EDC: 07/22/2016 Subchorionic hemorrhage:  None visualized. Maternal uterus/adnexae: Neither ovary visualized, question due to bowel gas in adnexa. No free pelvic fluid or adnexal masses. IMPRESSION: Single live intrauterine gestation measured at 7 weeks 3 days EGA by crown-rump  length. No acute abnormalities. Electronically Signed   By: Lavonia Dana M.D.   On: 12/07/2015 19:25   US Ob Transvaginal  12/07/2015  CLINICAL DATA:  Pregnant, no LMP for 1 year, malnourishment, pelvic pain for 2 weeks, seizure at 1330 hours, positive pregnancy test, quantitative beta HCG pending EXAM: OBSTETRIC <14 WK [place] AND TRANSVAGINAL OB US TECHNIQUE: Both transabdominal and transvaginal ultrasound examinations were performed for complete evaluation of the gestation as well as the maternal uterus, adnexal regions, and pelvic cul-de-sac. Transvaginal technique was performed to assess early pregnancy. COMPARISON:  Non FINDINGS: Intrauterine gestational sac: Visualized, normal morphology Yolk sac:  Present Embryo:  Present Cardiac Activity: Present Heart Rate: 157  bpm CRL:  12.1  mm   7 w   3 d                  [place] EDC: 07/22/2016 Subchorionic hemorrhage:  None visualized. Maternal uterus/adnexae: Neither ovary visualized, question due to bowel gas in adnexa. No free pelvic fluid or adnexal masses. IMPRESSION: Single live intrauterine gestation measured at 7 weeks 3 days EGA by crown-rump length. No acute abnormalities. Electronically Signed   By: Lavonia Dana M.D.   On: 12/07/2015 19:25    EKG:    IMPRESSION AND PLAN:   Olevia Westervelt  is a 30 y.o. female with a known history of alcohol dependence, smoking, depression with anxiety, history of severe malnourishment status post PEG placement which was removed last year comes into the emergency room with seizures that were witnessed by patient's fields at this afternoon around 3:00. Patient has been trying to cut down her alcohol and she was found to have shaking spell with urinary incontinence today.  1. Alcohol withdrawal seizures, recurrent in patient with severe alcohol dependence -Admit to telemetry -Seizure precautions -IV Ativan per CIWA protocol. Patient and patient's fiance understands the risk and side effects of Ativan could cause to the  fetus. They voice understanding -Year physician tried to transfer patient to [facility] under substance abuse since patient is pregnant how or the coordinator was not available at this time. Consider calling tomorrow to see if patient can be accepted there. -Psychiatry consultation -IV multivitamin, folic acid, thiamine  2. Tobacco abuse smoking cessation  advised 3 minutes spent  3. Hyponatremia suspect due to alcohol abuse and dehydration will continue IV fluids  4. Patient is pregnant according to ultrasound she is 7 weeks Consider OB/GYN appointment as outpatient.  All the records are reviewed and case discussed with ED provider. Management plans discussed with the patient, family and they are in agreement.  CODE STATUS: full  TOTAL TIME TAKING CARE OF THIS PATIENT:  45 minutes.    Jaszmine Navejas M.D on 12/07/2015 at 11:06 PM  Between 7am to 6pm - Pager - [phone]  After 6pm go to [email or web address] - password [email or web address] [facility]  [facility]  Office  [phone]  CC: Primary care  physician; Leonides Sake, MD

## 2015-12-07 NOTE — ED Notes (Signed)
Pt back from ultrasound at this time.  Pt smiling, in NAD.  Introduced self.  Pt has no questions or concerns at this time.

## 2015-12-07 NOTE — ED Notes (Signed)
Patient transported to Ultrasound 

## 2015-12-07 NOTE — ED Notes (Signed)
Pt resting comfortably at this time.  Friend at bedside.

## 2015-12-07 NOTE — ED Notes (Signed)
Pt to ED via EMS from home with seizure like activity today. Pt with no known hx of seizures, states "i just often have withdrawal seizures" Pt hx of etoh and substance abuse, typically drinks 2 beers per day, denies any use today. Pt states "found out I was pregnant yesterday so I stopped drinking" Upon arrival pt AAOx4, vitals stable, NAD noted. Slight drowsiness noted.

## 2015-12-08 DIAGNOSIS — F1020 Alcohol dependence, uncomplicated: Secondary | ICD-10-CM

## 2015-12-08 MED ORDER — GUAIFENESIN 100 MG/5ML PO SOLN
5.0000 mL | ORAL | Status: DC | PRN
  Administered 2015-12-08 – 2015-12-10 (×2): 100 mg via ORAL
  Filled 2015-12-08 (×2): qty 10

## 2015-12-08 MED ORDER — HEPARIN SODIUM (PORCINE) 5000 UNIT/ML IJ SOLN
5000.0000 [IU] | Freq: Three times a day (TID) | INTRAMUSCULAR | Status: DC
  Administered 2015-12-08: 5000 [IU] via SUBCUTANEOUS
  Filled 2015-12-08: qty 1

## 2015-12-08 MED ORDER — ACETAMINOPHEN 650 MG RE SUPP: 650.0000 mg | Freq: Four times a day (QID) | RECTAL | Status: DC | PRN

## 2015-12-08 MED ORDER — PRENATAL MULTIVITAMIN CH
1.0000 | ORAL_TABLET | Freq: Every day | ORAL | Status: DC
Start: 2015-12-08 — End: 2015-12-10
  Administered 2015-12-08 – 2015-12-10 (×3): 1 via ORAL
  Filled 2015-12-08 (×4): qty 1

## 2015-12-08 MED ORDER — ONDANSETRON HCL 4 MG/2ML IJ SOLN: 4.0000 mg | Freq: Four times a day (QID) | INTRAMUSCULAR | Status: DC | PRN

## 2015-12-08 MED ORDER — LORAZEPAM 2 MG/ML IJ SOLN: 1.0000 mg | INTRAMUSCULAR | Status: DC | PRN

## 2015-12-08 MED ORDER — ACETAMINOPHEN 325 MG PO TABS: 650.0000 mg | ORAL_TABLET | Freq: Four times a day (QID) | ORAL | Status: DC | PRN

## 2015-12-08 MED ORDER — LORAZEPAM 1 MG PO TABS
1.0000 mg | ORAL_TABLET | ORAL | Status: DC | PRN
  Administered 2015-12-08 – 2015-12-09 (×4): 1 mg via ORAL
  Filled 2015-12-08 (×4): qty 1

## 2015-12-08 MED ORDER — ONDANSETRON HCL 4 MG PO TABS
4.0000 mg | ORAL_TABLET | Freq: Four times a day (QID) | ORAL | Status: DC | PRN
  Administered 2015-12-08 – 2015-12-09 (×2): 4 mg via ORAL
  Filled 2015-12-08 (×2): qty 1

## 2015-12-08 MED ORDER — THIAMINE HCL 100 MG/ML IJ SOLN
Freq: Once | INTRAVENOUS | Status: AC
  Administered 2015-12-08: 01:00:00 via INTRAVENOUS
  Filled 2015-12-08: qty 1000

## 2015-12-08 NOTE — Progress Notes (Signed)
Pt requesting ativan for her tremors.  Medication given and reassess

## 2015-12-08 NOTE — Clinical Social Work Note (Signed)
Clinical Social Work Assessment  Patient Details  Name: Kristine Macias MRN: [id number] Date of Birth: August 09, 1986  Date of referral:  12/08/15               Reason for consult:  Substance Use/ETOH Abuse, Other (Comment Required), Emotional/Coping/Adjustment to Illness, Discharge Planning, Community Resources (recent pregnancy)                Permission sought to share information with:  Case Freight forwarder, Landscape architect granted to share information::  No  Name::        Agency::     Relationship::     Contact Information:     Housing/Transportation Living arrangements for the past 2 months:  Single Family Home Source of Information:  Patient, Medical Team Patient Interpreter Needed:  None Criminal Activity/Legal Involvement Pertinent to Current Situation/Hospitalization:  No - Comment as needed Significant Relationships:  Other Family Members, Significant Other, Dependent Children Lives with:  Self, Significant Other, Minor Children Do you feel safe going back to the place where you live?  Yes Need for family participation in patient care:  No (Coment)  Care giving concerns:  No concerns noted by patient. Patient lives at home with her mother and has 2 minor children 31 years old and 2 years old.  Patient reports she has a lot of stress in her life and coping skill is to use alcohol.  She reports when she found out she was pregnant she stopped drinking and came to the hospital.  Reports she cane return home with no barriers. Mother and significant other support patient and children financially.    Social Worker assessment / plan:  LCSW met with patient at bedside. Discussed community resources, residential facility for substance abuse and pregnancy and outpatient resources. Patient is already involved in group setting for substance abuse that she meets with 2 times a week.  At this time she declines assistance with residential treatment, but open to resources.   Reports she is open  to follow up by LCSW tomorrow to engage and support.  No other needs at this time.  Psych consult pending. Patient denies any self harm, but reports she is overwhelmed with finances and housing as she reports she had to spend money on her car that was unplanned and has not recovered from spending.  Patient does have support from family.  No CPS involvement at this time per patient, she does have custody of her other 2 children.  Patient plans to keep child to term for this current pregnancy.  Employment status:  Unemployed Forensic scientist:  Medicaid In [place] (maternal medicaid) PT Recommendations:  Not assessed at this time Information / Referral to community resources:  Outpatient Substance Abuse Treatment Options, Residential Substance Abuse Treatment Options  Patient/Family's Response to care:  Agreeable and appreciative of consult.  Patient/Family's Understanding of and Emotional Response to Diagnosis, Current Treatment, and Prognosis:  Patient verbalizes understanding of current treatment and prognosis. Reports anxiety and frustration with current psychosocial issues, however reports support and plans to stop drinking during pregnancy.  Not agreeable to residential treatment at this time which would be best option for sobriety.  Emotional Assessment Appearance:  Appears stated age Attitude/Demeanor/Rapport:  Other (depressed, flat, tearful) Affect (typically observed):  Accepting, Depressed, Tearful/Crying, Overwhelmed Orientation:  Oriented to Self, Oriented to Place, Oriented to  Time, Oriented to Situation Alcohol / Substance use:  Alcohol Use Psych involvement (Current and /or in the community):  Yes (Comment) (consult pending)  Discharge  Needs  Concerns to be addressed:  Financial / Insurance Concerns, Substance Abuse Concerns Readmission within the last 30 days:  No Current discharge risk:  Substance Abuse Barriers to Discharge:  Active Substance Use, Continued Medical Work  up   Marshell Garfinkel 12/08/2015, 10:19 AM

## 2015-12-08 NOTE — Progress Notes (Signed)
Pt alert and oriented x4, no complaints of pain or discomfort.  Bed in low position, call bell within reach.  Bed alarms on and functioning.  Assessment done and charted.  Will continue to monitor and do hourly rounding throughout the shift.  [employer] scheduled this shift.  Will monitor for anxiousness, tremors and ect for alcohol withdrawals

## 2015-12-08 NOTE — Consult Note (Signed)
[facility] Face-to-Face Psychiatry Consult   Reason for Consult:  Consult for this 30 year old woman with alcohol dependence and cirrhosis and multiple complications of alcohol abuse. Referring Physician:  Hower Patient Identification: Kristine Macias MRN:  [id number] Principal Diagnosis: Alcohol use disorder, severe, dependence ([place]) Diagnosis:   Patient Active Problem List   Diagnosis Date Noted  . Alcohol withdrawal seizure ([place]) [F57.322, R56.9] 12/07/2015  . Substance induced mood disorder ([place]) [F19.94] 10/18/2015  . Alcohol abuse [F10.10] 10/18/2015  . Noncompliance [Z91.19] 10/18/2015  . Ascites [R18.8] 10/18/2015  . Portal hypertension ([place]) [K76.6] 10/18/2015  . 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 CAP (community acquired pneumonia) [J18.9] 10/05/2015  . Hepatitis [K75.9] 08/08/2015  . Nausea & vomiting [R11.2] 08/08/2015  . Alcoholic hepatitis without ascites [K70.10]   . Thrombocytopenia ([place]) [D69.6] 07/11/2015  . Hyponatremia [E87.1] 07/11/2015  . Alcoholic hepatitis with ascites [K70.11]   . Alcoholic hepatitis [G25.42] 07/09/2015  . Alcohol intoxication ([place]) [F10.129] 07/09/2015  . Withdrawal seizures ([place]) [H06.237, R56.9] 07/01/2015  . Benzodiazepine abuse [F13.10] 07/01/2015  . Drug withdrawal seizure ([place]) [S28.315, R56.9] 06/21/2015  . Chest pain [R07.9] 05/20/2015  . GAD (generalized anxiety disorder) [F41.1] 04/17/2015  . Panic disorder [F41.0] 04/17/2015  . Alcohol withdrawal ([place]) [F10.239] 04/16/2015  . Abdominal pain, generalized [R10.84] 04/16/2015  . Vomiting and diarrhea [R11.10, R19.7]   . Failure to thrive in adult [R62.7]   . Intractable nausea and vomiting [R11.10] 03/01/2015  . Hypokalemia [E87.6] 03/01/2015  . Protein-calorie malnutrition, severe ([place]) [E43] 01/05/2015  . Generalized anxiety disorder [F41.1] 11/26/2014    Class: Chronic  . Alcohol use disorder, severe, dependence ([place]) [F10.20] 11/25/2014  . Adnexal mass [N94.9]  04/10/2012    Total Time spent with patient: 1 hour  Subjective:   Kristine Macias is a 30 y.o. female patient admitted with "I'm okay. I'm pregnant".  HPI:  Patient interviewed. Chart reviewed. Patient is known to me from previous encounters. Old notes reviewed. 30 year old woman with a history of alcohol abuse currently in the hospital after having an alcohol withdrawal seizure. Patient is mostly focused on the fact that she has learned that she is pregnant here in the hospital. She feels that this is a positive development. Patient claims that she had been drinking "almost not at all" and was only drinking about 1 beer per week. She does not think there was a connection between the alcohol and the seizures. The intake information suggests otherwise and based on her past history I suspect that it's probably the case that she still drinking significantly more than that. Denies that she is abusing other drugs. She says her mood recently has been fairly good. Denies any suicidal ideation. Not feeling overly anxious or panicky. She has managed to gain a little bit of weight back.  Social history: Patient is living with her mother and her other child. She feels positive about the pregnancy. Says the father of the pregnancy is actually positively involved in her life.  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 Has had multiple seizures in the past almost certainly related to alcohol withdrawal although she has other medical problems that could be contributing.  Substance abuse history: Long-standing alcohol abuse. In my encounters with her I found her to be quite resistant often to being honest about her full alcohol abuse problem and very much motivated to avoid talking about it and engaging in treatment.  Past Psychiatric  History: Patient has had a history of anxiety symptoms in the past. Has been  treated with antidepressant medicines and occasionally have benzodiazepines. Denies serious suicide attempts. Tends to focus on the symptoms at the expense of her substance abuse  Risk to Self: Is patient at risk for suicide?: No Risk to Others:   Prior Inpatient Therapy:   Prior Outpatient Therapy:    Past Medical History:  Past Medical History  Diagnosis Date  . Depression with anxiety initally at age 82  . Chlamydia 2007    bacterial vaginosis 09/2011  . Irregular heart beat 2010    wt/diet related after evaluation  . Renal disorder   . Pneumothorax, spontaneous, tension   . Alcohol abuse 11/2014    drinking since age 57. chronic, recurrent. at least 2 detox admits before 2015.   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   . Failure to thrive in adult 12/2014.     Malnutrition: n/v, not eating, weight loss, BMI 14.  s/p 01/11/2015 PEG (Dr Dorna Leitz).   . Alcoholic hepatitis 12/9290    hepatic steatosis on 11/2014 ultrasound.   . Seizure ([place]) 06/2015    due to ETOH/benzo withdrawal.   . Thrombocytopenia ([place]) 04/2007  . Colitis 12/2014    colonoscopy for diarrhea and abnml CT 01/06/15: erythmatous TI (path: active ileitis, ? emerging IBD), rectal erythema (path: mucosal prolapse). Random bx of normal colon (path unremarkable)   . Spontaneous pneumothorax 08/2012    right.  chest tube placed.     Past Surgical History  Procedure Laterality Date  . Cesarean section  04/2007, 07/2009     x 2.  G3, Para 2012 as of 07/2009.   Marland Kitchen Chest tube insertion    . Esophagogastroduodenoscopy N/A 01/06/2015    ;[facility], Dr Rayann Heman. For wt loss, N/V: Normal study, duodenal biopsy/pathology: chronic active duodenitis.   . Colonoscopy with propofol N/A 01/06/2015    [facility], Dr Rayann Heman. colonoscopy for diarrhea and abnml CT 01/06/15: erythmatous TI (path: active ileitis, ? emerging IBD), rectal erythema (path: mucosal prolapse). Random bx of normal colon (path unremarkable)   . Peg placement N/A 01/11/2015    [facility],  Dr Rayann Heman.  for N/V/wt loss/severe malnutrition.   . Esophagogastroduodenoscopy N/A 01/11/2015    Rein-normal with PEG placement   Family History:  Family History  Problem Relation Age of Onset  . Anesthesia problems Neg Hx   . Thyroid disease Mother   . Cancer Father   . Stroke Other   . Crohn's disease Brother    Family Psychiatric  History: Positive for anxiety and mood problems Social History:  History  Alcohol Use  . 21.6 oz/week  . 53 Cans of beer per week    Comment: 6 beers/day; trying to quit, participating in AA     History  Drug Use No    Social History   Social History  . Marital Status: Single    Spouse Name: N/A  . Number of Children: N/A  . Years of Education: N/A   Social History Main Topics  . Smoking status: Current Every Day Smoker -- 2.00 packs/day    Types: Cigarettes  . Smokeless tobacco: Never Used  . Alcohol Use: 21.6 oz/week    36 Cans of beer per week     Comment: 6 beers/day; trying to quit, participating in [place]  . Drug Use: No  . Sexual Activity: Yes    Birth Control/ Protection: None   Other Topics  Concern  . None   Social History Narrative   Lives with mother & 2 children   disabled   Additional Social History:    Allergies:   Allergies  Allergen Reactions  . Aspirin Shortness Of Breath  . Effexor [Venlafaxine] Anaphylaxis  . Sulfa Antibiotics Anaphylaxis  . Tetracyclines & Related Anaphylaxis  . Trazodone And Nefazodone Anaphylaxis  . Latex Hives and Itching  . Paxil [Paroxetine] Hives and Itching  . Seroquel [Quetiapine Fumarate] Hives and Itching  . Zoloft [Sertraline Hcl] Hives and Itching    Labs:  Results for orders placed or performed during the hospital encounter of 12/07/15 (from the past 48 hour(s))  hCG, quantitative, pregnancy     Status: Abnormal   Collection Time: 12/07/15  5:02 PM  Result Value Ref Range   hCG, Beta Chain, Quant, S 126633 (H) <5 mIU/mL    Comment:          GEST. AGE      CONC.   (mIU/mL)   <=1 WEEK        5 - 50     2 WEEKS       50 - 500     3 WEEKS       100 - 10,000     4 WEEKS     1,000 - 30,000     5 WEEKS     3,500 - 115,000   6-8 WEEKS     12,000 - 270,000    12 WEEKS     15,000 - 220,000        FEMALE AND NON-PREGNANT FEMALE:     LESS THAN 5 mIU/mL   Urinalysis complete, with microscopic     Status: Abnormal   Collection Time: 12/07/15  5:02 PM  Result Value Ref Range   Color, Urine STRAW (A) YELLOW   APPearance CLEAR (A) CLEAR   Glucose, UA NEGATIVE NEGATIVE mg/dL   Bilirubin Urine NEGATIVE NEGATIVE   Ketones, ur NEGATIVE NEGATIVE mg/dL   Specific Gravity, Urine 1.003 (L) 1.005 - 1.030   Hgb urine dipstick NEGATIVE NEGATIVE   pH 6.0 5.0 - 8.0   Protein, ur NEGATIVE NEGATIVE mg/dL   Nitrite NEGATIVE NEGATIVE   Leukocytes, UA NEGATIVE NEGATIVE   RBC / HPF NONE SEEN 0 - 5 RBC/hpf   WBC, UA 0-5 0 - 5 WBC/hpf   Bacteria, UA RARE (A) NONE SEEN   Squamous Epithelial / LPF 0-5 (A) NONE SEEN   Hyaline Casts, UA PRESENT   Urine Drug Screen, Qualitative     Status: Abnormal   Collection Time: 12/07/15  5:02 PM  Result Value Ref Range   Tricyclic, Ur Screen NONE DETECTED NONE DETECTED   Amphetamines, Ur Screen NONE DETECTED NONE DETECTED   MDMA (Ecstasy)Ur Screen NONE DETECTED NONE DETECTED   Cocaine Metabolite,Ur [place] NONE DETECTED NONE DETECTED   Opiate, Ur Screen NONE DETECTED NONE DETECTED   Phencyclidine (PCP) Ur S NONE DETECTED NONE DETECTED   Cannabinoid 50 Ng, Ur [place] NONE DETECTED NONE DETECTED   Barbiturates, Ur Screen NONE DETECTED NONE DETECTED   Benzodiazepine, Ur Scrn POSITIVE (A) NONE DETECTED   Methadone Scn, Ur NONE DETECTED NONE DETECTED    Comment: (NOTE) 756  Tricyclics, urine               Cutoff 1000 ng/mL 200  Amphetamines, urine             Cutoff 1000 ng/mL 300  MDMA (Ecstasy), urine  Cutoff 500 ng/mL 400  Cocaine Metabolite, urine       Cutoff 300 ng/mL 500  Opiate, urine                   Cutoff 300 ng/mL 600   Phencyclidine (PCP), urine      Cutoff 25 ng/mL 700  Cannabinoid, urine              Cutoff 50 ng/mL 800  Barbiturates, urine             Cutoff 200 ng/mL 900  Benzodiazepine, urine           Cutoff 200 ng/mL 1000 Methadone, urine                Cutoff 300 ng/mL 1100 1200 The urine drug screen provides only a preliminary, unconfirmed 1300 analytical test result and should not be used for non-medical 1400 purposes. Clinical consideration and professional judgment should 1500 be applied to any positive drug screen result due to possible 1600 interfering substances. A more specific alternate chemical method 1700 must be used in order to obtain a confirmed analytical result.  1800 Gas chromato graphy / mass spectrometry (GC/MS) is the preferred 1900 confirmatory method.   Basic metabolic panel     Status: Abnormal   Collection Time: 12/07/15  5:02 PM  Result Value Ref Range   Sodium 132 (L) 135 - 145 mmol/L   Potassium 3.5 3.5 - 5.1 mmol/L    Comment: HEMOLYSIS AT THIS LEVEL MAY AFFECT RESULT   Chloride 97 (L) 101 - 111 mmol/L   CO2 24 22 - 32 mmol/L   Glucose, Bld 110 (H) 65 - 99 mg/dL   BUN 14 6 - 20 mg/dL   Creatinine, Ser 0.45 0.44 - 1.00 mg/dL   Calcium 8.6 (L) 8.9 - 10.3 mg/dL   GFR calc non Af Amer >60 >60 mL/min   GFR calc Af Amer >60 >60 mL/min    Comment: (NOTE) The eGFR has been calculated using the CKD EPI equation. This calculation has not been validated in all clinical situations. eGFR's persistently <60 mL/min signify possible Chronic Kidney Disease.    Anion gap 11 5 - 15  Ethanol     Status: Abnormal   Collection Time: 12/07/15  5:02 PM  Result Value Ref Range   Alcohol, Ethyl (B) 288 (H) <5 mg/dL    Comment:        LOWEST DETECTABLE LIMIT FOR SERUM ALCOHOL IS 5 mg/dL FOR MEDICAL PURPOSES ONLY   CBC with Differential     Status: Abnormal   Collection Time: 12/07/15  5:02 PM  Result Value Ref Range   WBC 3.8 3.6 - 11.0 K/uL   RBC 2.62 (L) 3.80 - 5.20  MIL/uL   Hemoglobin 8.1 (L) 12.0 - 16.0 g/dL   HCT 24.7 (L) 35.0 - 47.0 %   MCV 94.0 80.0 - 100.0 fL   MCH 30.9 26.0 - 34.0 pg   MCHC 32.9 32.0 - 36.0 g/dL   RDW 15.9 (H) 11.5 - 14.5 %   Platelets 128 (L) 150 - 440 K/uL   Neutrophils Relative % 67 %   Neutro Abs 2.6 1.4 - 6.5 K/uL   Lymphocytes Relative 23 %   Lymphs Abs 0.9 (L) 1.0 - 3.6 K/uL   Monocytes Relative 8 %   Monocytes Absolute 0.3 0.2 - 0.9 K/uL   Eosinophils Relative 1 %   Eosinophils Absolute 0.0 0 - 0.7 K/uL   Basophils Relative  1 %   Basophils Absolute 0.0 0 - 0.1 K/uL  Pregnancy, urine POC     Status: Abnormal   Collection Time: 12/07/15  5:06 PM  Result Value Ref Range   Preg Test, Ur POSITIVE (A) NEGATIVE    Comment:        THE SENSITIVITY OF THIS METHODOLOGY IS >24 mIU/mL     Current Facility-Administered Medications  Medication Dose Route Frequency Provider Last Rate Last Dose  . acetaminophen (TYLENOL) tablet 650 mg  650 mg Oral Q6H PRN Fritzi Mandes, MD       Or  . acetaminophen (TYLENOL) suppository 650 mg  650 mg Rectal Q6H PRN Fritzi Mandes, MD      . folic acid (FOLVITE) tablet 1 mg  1 mg Oral Daily Fritzi Mandes, MD   1 mg at 12/08/15 0857  . guaiFENesin (ROBITUSSIN) 100 MG/5ML solution 100 mg  5 mL Oral Q4H PRN Fritzi Mandes, MD   100 mg at 12/08/15 0136  . LORazepam (ATIVAN) tablet 1 mg  1 mg Oral Q4H PRN Lytle Butte, MD   1 mg at 12/08/15 1544   Or  . LORazepam (ATIVAN) injection 1 mg  1 mg Intravenous Q4H PRN Lytle Butte, MD      . ondansetron [facility]) tablet 4 mg  4 mg Oral Q6H PRN Fritzi Mandes, MD   4 mg at 12/08/15 0120   Or  . ondansetron (ZOFRAN) injection 4 mg  4 mg Intravenous Q6H PRN Fritzi Mandes, MD      . prenatal multivitamin tablet 1 tablet  1 tablet Oral Q1200 Lytle Butte, MD   1 tablet at 12/08/15 1544  . thiamine (VITAMIN B-1) tablet 100 mg  100 mg Oral Daily Fritzi Mandes, MD   100 mg at 12/08/15 0857   Or  . thiamine (B-1) injection 100 mg  100 mg Intravenous Daily Fritzi Mandes, MD         Musculoskeletal: Strength & Muscle Tone: decreased Gait & Station: ataxic Patient leans: N/A  Psychiatric Specialty Exam: Review of Systems  Constitutional: Negative.   HENT: Negative.   Eyes: Negative.   Respiratory: Negative.   Cardiovascular: Negative.   Gastrointestinal: Negative.   Musculoskeletal: Negative.   Skin: Negative.   Neurological: Negative.   Psychiatric/Behavioral: Positive for substance abuse. Negative for depression, suicidal ideas, hallucinations and memory loss. The patient is nervous/anxious. The patient does not have insomnia.     Blood pressure 108/65, pulse 95, temperature 98.8 F (37.1 C), temperature source Oral, resp. rate 20, height 5' 6"  (1.676 m), weight 46.947 kg (103 lb 8 oz), SpO2 95 %.Body mass index is 16.71 kg/(m^2).  General Appearance: Disheveled  Engineer, water::  Fair  Speech:  Normal Rate  Volume:  Decreased  Mood:  Euthymic  Affect:  Constricted  Thought Process:  Goal Directed  Orientation:  Full (Time, Place, and Person)  Thought Content:  Negative  Suicidal Thoughts:  No  Homicidal Thoughts:  No  Memory:  Immediate;   Good Recent;   Fair Remote;   Fair  Judgement:  Fair  Insight:  Fair  Psychomotor Activity:  Decreased  Concentration:  Fair  Recall:  [employer] of Knowledge:Fair  Language: Fair  Akathisia:  No  Handed:  Right  AIMS (if indicated):     Assets:  Desire for Improvement Housing Social Support  ADL's:  Intact  Cognition: WNL  Sleep:      Treatment Plan Summary: Plan 30 year old woman with a  history of alcohol abuse. Now she is also pregnant. Has alcohol withdrawal seizures as well. Patient was concerned about whether she needed to continue taking her anticonvulsant and whether that would be safe. I'm going to defer that decision to neurology. The patient is a non-reliable historian as far as her substance abuse is concerned. If you go by her history it sounds like seizures and the alcohol are mostly  uncouple but I think this is not likely to be true. I suspect her alcohol is still a main driving factor of her seizures. Patient was gently counseled about the importance of trying to get even more focus on sobriety now that she is pregnant and planning to keep the baby. Supportive counseling done. No medication indicated. She is [place] on detox protocol. Certainly it's better to take the Ativan and go through the detox protocol than to have another seizure. I will continue to try and follow-up while she is here. She has been referred multiple times for outpatient substance abuse treatment.  Disposition: Patient does not meet criteria for psychiatric inpatient admission.  Alethia Berthold, MD 12/08/2015 7:36 PM

## 2015-12-08 NOTE — Progress Notes (Signed)
[place] [facility] [place] NAME: Kristine Macias    MRN#:  [id number]  DATE OF BIRTH:  26-Oct-1985  SUBJECTIVE:  Hospital Day: 1 day Kristine Macias is a 30 y.o. female presenting with Seizures .   Overnight events: No overnight events Interval Events: Still having symptoms of anxiety, jitteriness, tremor states afraid to ask for Ativan but whenever he sees it does help  REVIEW OF SYSTEMS:  CONSTITUTIONAL: No fever, fatigue or weakness.  EYES: No blurred or double vision.  EARS, NOSE, AND THROAT: No tinnitus or ear pain.  RESPIRATORY: No cough, shortness of breath, wheezing or hemoptysis.  CARDIOVASCULAR: No chest pain, orthopnea, edema.  GASTROINTESTINAL: No nausea, vomiting, diarrhea or abdominal pain.  GENITOURINARY: No dysuria, hematuria.  ENDOCRINE: No polyuria, nocturia,  HEMATOLOGY: No anemia, easy bruising or bleeding SKIN: No rash or lesion. MUSCULOSKELETAL: No joint pain or arthritis.   NEUROLOGIC: No tingling, numbness, weakness.  PSYCHIATRY: No anxiety or depression.   DRUG ALLERGIES:   Allergies  Allergen Reactions  . Aspirin Shortness Of Breath  . Effexor [Venlafaxine] Anaphylaxis  . Sulfa Antibiotics Anaphylaxis  . Tetracyclines & Related Anaphylaxis  . Trazodone And Nefazodone Anaphylaxis  . Latex Hives and Itching  . Paxil [Paroxetine] Hives and Itching  . Seroquel [Quetiapine Fumarate] Hives and Itching  . Zoloft [Sertraline Hcl] Hives and Itching    VITALS:  Blood pressure 118/77, pulse 84, temperature 98.4 F (36.9 C), temperature source Oral, resp. rate 16, height 5' 6"  (1.676 m), weight 46.947 kg (103 lb 8 oz), SpO2 98 %.  PHYSICAL EXAMINATION:  VITAL SIGNS: Filed Vitals:   12/08/15 0853 12/08/15 1125  BP: 118/74 118/77  Pulse: 103 84  Temp: 99.5 F (37.5 C) 98.4 F (36.9 C)  Resp: 18 16   GENERAL:29 y.o.female currently in no acute distress. Frail appearing HEAD: Normocephalic, atraumatic.   EYES: Pupils equal, round, reactive to light. Extraocular muscles intact. No scleral icterus.  MOUTH: Moist mucosal membrane. Dentition intact. No abscess noted.  EAR, NOSE, THROAT: Clear without exudates. No external lesions.  NECK: Supple. No thyromegaly. No nodules. No JVD.  PULMONARY: Clear to ascultation, without wheeze rails or rhonci. No use of accessory muscles, Good respiratory effort. good air entry bilaterally CHEST: Nontender to palpation.  CARDIOVASCULAR: S1 and S2. Regular rate and rhythm. No murmurs, rubs, or gallops. No edema. Pedal pulses 2+ bilaterally.  GASTROINTESTINAL: Soft, nontender, nondistended. No masses. Positive bowel sounds. No hepatosplenomegaly.  MUSCULOSKELETAL: No swelling, clubbing, or edema. Range of motion full in all extremities.  NEUROLOGIC: Cranial nerves II through XII are intact. No gross focal neurological deficits. Sensation intact. Reflexes intact.  SKIN: No ulceration, lesions, rashes, or cyanosis. Skin warm and dry. Turgor intact.  PSYCHIATRIC: Mood, affect flat. The patient is awake, alert and oriented x 3. Insight, judgment intact. tremors      LABORATORY PANEL:   CBC  Recent Labs Lab 12/07/15 1702  WBC 3.8  HGB 8.1*  HCT 24.7*  PLT 128*   ------------------------------------------------------------------------------------------------------------------  Chemistries   Recent Labs Lab 12/07/15 1702  NA 132*  K 3.5  CL 97*  CO2 24  GLUCOSE 110*  BUN 14  CREATININE 0.45  CALCIUM 8.6*   ------------------------------------------------------------------------------------------------------------------  Cardiac Enzymes No results for input(s): TROPONINI in the last 168 hours. ------------------------------------------------------------------------------------------------------------------  RADIOLOGY:  US Ob Comp Less 14 Wks  12/07/2015  CLINICAL DATA:  Pregnant, no LMP for 1 year, malnourishment, pelvic pain for 2 weeks,  seizure at  1330 hours, positive pregnancy test, quantitative beta HCG pending EXAM: OBSTETRIC <14 WK [place] AND TRANSVAGINAL OB US TECHNIQUE: Both transabdominal and transvaginal ultrasound examinations were performed for complete evaluation of the gestation as well as the maternal uterus, adnexal regions, and pelvic cul-de-sac. Transvaginal technique was performed to assess early pregnancy. COMPARISON:  Non FINDINGS: Intrauterine gestational sac: Visualized, normal morphology Yolk sac:  Present Embryo:  Present Cardiac Activity: Present Heart Rate: 157  bpm CRL:  12.1  mm   7 w   3 d                  [place] EDC: 07/22/2016 Subchorionic hemorrhage:  None visualized. Maternal uterus/adnexae: Neither ovary visualized, question due to bowel gas in adnexa. No free pelvic fluid or adnexal masses. IMPRESSION: Single live intrauterine gestation measured at 7 weeks 3 days EGA by crown-rump length. No acute abnormalities. Electronically Signed   By: Lavonia Dana M.D.   On: 12/07/2015 19:25   US Ob Transvaginal  12/07/2015  CLINICAL DATA:  Pregnant, no LMP for 1 year, malnourishment, pelvic pain for 2 weeks, seizure at 1330 hours, positive pregnancy test, quantitative beta HCG pending EXAM: OBSTETRIC <14 WK [place] AND TRANSVAGINAL OB US TECHNIQUE: Both transabdominal and transvaginal ultrasound examinations were performed for complete evaluation of the gestation as well as the maternal uterus, adnexal regions, and pelvic cul-de-sac. Transvaginal technique was performed to assess early pregnancy. COMPARISON:  Non FINDINGS: Intrauterine gestational sac: Visualized, normal morphology Yolk sac:  Present Embryo:  Present Cardiac Activity: Present Heart Rate: 157  bpm CRL:  12.1  mm   7 w   3 d                  [place] EDC: 07/22/2016 Subchorionic hemorrhage:  None visualized. Maternal uterus/adnexae: Neither ovary visualized, question due to bowel gas in adnexa. No free pelvic fluid or adnexal masses. IMPRESSION: Single live intrauterine  gestation measured at 7 weeks 3 days EGA by crown-rump length. No acute abnormalities. Electronically Signed   By: Lavonia Dana M.D.   On: 12/07/2015 19:25    EKG:   Orders placed or performed during the hospital encounter of 08/08/15  . ED EKG within 10 minutes  . ED EKG within 10 minutes  . EKG    ASSESSMENT AND PLAN:   Kristine Macias is a 30 y.o. female presenting with Seizures . Admitted 12/07/2015 : Day #: 1 day   1. Alcohol withdrawal seizures, recurrent in patient with severe alcohol dependence -Seizure precautions -IV Ativan per CIWA protocol.- Change frequency to every 4 hours incentive every 6 given symptoms -ER physician tried to transfer patient to [facility] under substance abuse since patient is pregnant - they wish to have psych consult faxed to them before they would consider - Fax [phone] -Psychiatry consultation -IV multivitamin, folic acid, thiamine  2. Tobacco abuse smoking cessation  3. Hyponatremia suspect due to alcohol abuse and dehydration will continue IV fluids  4. Patient is pregnant according to ultrasound she is 7 weeks - start prenatal vitamins Consider OB/GYN appointment as outpatient.   All the records are reviewed and case discussed with Care Management/Social Workerr. Management plans discussed with the patient, family and they are in agreement.  CODE STATUS: full TOTAL TIME TAKING CARE OF THIS PATIENT: 28 minutes.   POSSIBLE D/C IN 1-2DAYS, DEPENDING ON CLINICAL CONDITION.   Hower,  Karenann Cai.D on 12/08/2015 at 11:55 AM  Between 7am to 6pm - Pager - [phone]  After  6pm: House Pager: - [phone]  [facility]  Office  [phone]  CC: Primary care physician; Leonides Sake, MD

## 2015-12-08 NOTE — Progress Notes (Signed)
Pt states medication helped with the tremors and anxiousness.

## 2015-12-08 NOTE — Progress Notes (Signed)
Patient arrived to 2A Room 236. Patient denies pain and all questions answered. Patient oriented to unit and Fall Safety Plan signed. Skin assessment completed with Alisa RN and skin intact. A&Ox4, VSS, and ST on tele box #[id number]. Patient's fiance at bedside. Nursing staff will continue to monitor. Earleen Reaper, RN

## 2015-12-09 MED ORDER — LORAZEPAM 2 MG/ML IJ SOLN: 1.0000 mg | Freq: Four times a day (QID) | INTRAMUSCULAR | Status: DC | PRN

## 2015-12-09 MED ORDER — LORAZEPAM 1 MG PO TABS
1.0000 mg | ORAL_TABLET | Freq: Four times a day (QID) | ORAL | Status: DC | PRN
  Administered 2015-12-09 – 2015-12-10 (×4): 1 mg via ORAL
  Filled 2015-12-09 (×4): qty 1

## 2015-12-09 NOTE — Progress Notes (Signed)
[place] [facility] [place] NAME: Kristine Macias    MRN#:  [id number]  DATE OF BIRTH:  10/16/1985  SUBJECTIVE:  Hospital Day: 2 days Kristine Macias is a 30 y.o. female presenting with Seizures .   Overnight events: No overnight events Interval Events: Complains of nausea since this morning  REVIEW OF SYSTEMS:  CONSTITUTIONAL: No fever, fatigue or weakness.  EYES: No blurred or double vision.  EARS, NOSE, AND THROAT: No tinnitus or ear pain.  RESPIRATORY: No cough, shortness of breath, wheezing or hemoptysis.  CARDIOVASCULAR: No chest pain, orthopnea, edema.  GASTROINTESTINAL: No nausea, vomiting, diarrhea or abdominal pain.  GENITOURINARY: No dysuria, hematuria.  ENDOCRINE: No polyuria, nocturia,  HEMATOLOGY: No anemia, easy bruising or bleeding SKIN: No rash or lesion. MUSCULOSKELETAL: No joint pain or arthritis.   NEUROLOGIC: No tingling, numbness, weakness.  PSYCHIATRY: No anxiety or depression.   DRUG ALLERGIES:   Allergies  Allergen Reactions  . Aspirin Shortness Of Breath  . Effexor [Venlafaxine] Anaphylaxis  . Sulfa Antibiotics Anaphylaxis  . Tetracyclines & Related Anaphylaxis  . Trazodone And Nefazodone Anaphylaxis  . Latex Hives and Itching  . Paxil [Paroxetine] Hives and Itching  . Seroquel [Quetiapine Fumarate] Hives and Itching  . Zoloft [Sertraline Hcl] Hives and Itching    VITALS:  Blood pressure 101/59, pulse 67, temperature 98.2 F (36.8 C), temperature source Oral, resp. rate 18, height 5' 6"  (1.676 m), weight 46.947 kg (103 lb 8 oz), SpO2 96 %.  PHYSICAL EXAMINATION:  VITAL SIGNS: Filed Vitals:   12/09/15 0436 12/09/15 1055  BP: 106/59 101/59  Pulse: 90 67  Temp: 97.7 F (36.5 C) 98.2 F (36.8 C)  Resp: 20 18   GENERAL:29 y.o.female currently in no acute distress. Frail appearing HEAD: Normocephalic, atraumatic.  EYES: Pupils equal, round, reactive to light. Extraocular muscles intact. No  scleral icterus.  MOUTH: Moist mucosal membrane. Dentition intact. No abscess noted.  EAR, NOSE, THROAT: Clear without exudates. No external lesions.  NECK: Supple. No thyromegaly. No nodules. No JVD.  PULMONARY: Clear to ascultation, without wheeze rails or rhonci. No use of accessory muscles, Good respiratory effort. good air entry bilaterally CHEST: Nontender to palpation.  CARDIOVASCULAR: S1 and S2. Regular rate and rhythm. No murmurs, rubs, or gallops. No edema. Pedal pulses 2+ bilaterally.  GASTROINTESTINAL: Soft, nontender, nondistended. No masses. Positive bowel sounds. No hepatosplenomegaly.  MUSCULOSKELETAL: No swelling, clubbing, or edema. Range of motion full in all extremities.  NEUROLOGIC: Cranial nerves II through XII are intact. No gross focal neurological deficits. Sensation intact. Reflexes intact.  SKIN: No ulceration, lesions, rashes, or cyanosis. Skin warm and dry. Turgor intact.  PSYCHIATRIC: Mood, affect flat. The patient is awake, alert and oriented x 3. Insight, judgment intact. tremors      LABORATORY PANEL:   CBC  Recent Labs Lab 12/07/15 1702  WBC 3.8  HGB 8.1*  HCT 24.7*  PLT 128*   ------------------------------------------------------------------------------------------------------------------  Chemistries   Recent Labs Lab 12/07/15 1702  NA 132*  K 3.5  CL 97*  CO2 24  GLUCOSE 110*  BUN 14  CREATININE 0.45  CALCIUM 8.6*   ------------------------------------------------------------------------------------------------------------------  Cardiac Enzymes No results for input(s): TROPONINI in the last 168 hours. ------------------------------------------------------------------------------------------------------------------  RADIOLOGY:  US Ob Comp Less 14 Wks  12/07/2015  CLINICAL DATA:  Pregnant, no LMP for 1 year, malnourishment, pelvic pain for 2 weeks, seizure at 1330 hours, positive pregnancy test, quantitative beta HCG pending  EXAM: OBSTETRIC <14 WK [place]  AND TRANSVAGINAL OB US TECHNIQUE: Both transabdominal and transvaginal ultrasound examinations were performed for complete evaluation of the gestation as well as the maternal uterus, adnexal regions, and pelvic cul-de-sac. Transvaginal technique was performed to assess early pregnancy. COMPARISON:  Non FINDINGS: Intrauterine gestational sac: Visualized, normal morphology Yolk sac:  Present Embryo:  Present Cardiac Activity: Present Heart Rate: 157  bpm CRL:  12.1  mm   7 w   3 d                  [place] EDC: 07/22/2016 Subchorionic hemorrhage:  None visualized. Maternal uterus/adnexae: Neither ovary visualized, question due to bowel gas in adnexa. No free pelvic fluid or adnexal masses. IMPRESSION: Single live intrauterine gestation measured at 7 weeks 3 days EGA by crown-rump length. No acute abnormalities. Electronically Signed   By: Lavonia Dana M.D.   On: 12/07/2015 19:25   US Ob Transvaginal  12/07/2015  CLINICAL DATA:  Pregnant, no LMP for 1 year, malnourishment, pelvic pain for 2 weeks, seizure at 1330 hours, positive pregnancy test, quantitative beta HCG pending EXAM: OBSTETRIC <14 WK [place] AND TRANSVAGINAL OB US TECHNIQUE: Both transabdominal and transvaginal ultrasound examinations were performed for complete evaluation of the gestation as well as the maternal uterus, adnexal regions, and pelvic cul-de-sac. Transvaginal technique was performed to assess early pregnancy. COMPARISON:  Non FINDINGS: Intrauterine gestational sac: Visualized, normal morphology Yolk sac:  Present Embryo:  Present Cardiac Activity: Present Heart Rate: 157  bpm CRL:  12.1  mm   7 w   3 d                  [place] EDC: 07/22/2016 Subchorionic hemorrhage:  None visualized. Maternal uterus/adnexae: Neither ovary visualized, question due to bowel gas in adnexa. No free pelvic fluid or adnexal masses. IMPRESSION: Single live intrauterine gestation measured at 7 weeks 3 days EGA by crown-rump length. No acute  abnormalities. Electronically Signed   By: Lavonia Dana M.D.   On: 12/07/2015 19:25    EKG:   Orders placed or performed during the hospital encounter of 08/08/15  . ED EKG within 10 minutes  . ED EKG within 10 minutes  . EKG    ASSESSMENT AND PLAN:   Kristine Stegall is a 30 y.o. female presenting with Seizures . Admitted 12/07/2015 : Day #: 2 days   1. Alcohol withdrawal seizures, recurrent in patient with severe alcohol dependence -Seizure precautions -IV Ativan per CIWA protocol.- Change frequency to every 6 hours-ER physician tried to transfer  -Psychiatry consultation appreciated -IV multivitamin, folic acid, thiamine  2. Tobacco abuse smoking cessation  3. Hyponatremia suspect due to alcohol abuse and dehydration will continue IV fluids  4. Patient is pregnant according to ultrasound she is 7 weeks - start prenatal vitamins Consider OB/GYN appointment as outpatient.   All the records are reviewed and case discussed with Care Management/Social Workerr. Management plans discussed with the patient, family and they are in agreement.  CODE STATUS: full TOTAL TIME TAKING CARE OF THIS PATIENT: 28 minutes.   POSSIBLE D/C IN 1-2DAYS, DEPENDING ON CLINICAL CONDITION.   Lummie Montijo,  Karenann Cai.D on 12/09/2015 at 12:40 PM  Between 7am to 6pm - Pager - [phone]  After 6pm: House Pager: - [facility]  Office  [phone]  CC: Primary care physician; Leonides Sake, MD

## 2015-12-09 NOTE — Progress Notes (Addendum)
Patient continues to decline bed alarm. Safety education provided and patient reports she will call before getting up and wait for aide or RN before doing so. Patient has had no seizure activity since this RN took over care at 1530.

## 2015-12-09 NOTE — Consult Note (Signed)
[facility] Face-to-Face Psychiatry Consult   Reason for Consult:  Consult for this 30 year old woman with alcohol dependence and cirrhosis and multiple complications of alcohol abuse. Referring Physician:  [place] Patient Identification: Kristine Macias MRN:  [id number] Principal Diagnosis: Alcohol use disorder, severe, dependence ([place]) Diagnosis:   Patient Active Problem List   Diagnosis Date Noted  . Alcohol withdrawal seizure ([place]) [J18.841, R56.9] 12/07/2015  . Substance induced mood disorder ([place]) [F19.94] 10/18/2015  . Alcohol abuse [F10.10] 10/18/2015  . Noncompliance [Z91.19] 10/18/2015  . Ascites [R18.8] 10/18/2015  . Portal hypertension ([place]) [K76.6] 10/18/2015  . 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 CAP (community acquired pneumonia) [J18.9] 10/05/2015  . Hepatitis [K75.9] 08/08/2015  . Nausea & vomiting [R11.2] 08/08/2015  . Alcoholic hepatitis without ascites [K70.10]   . Thrombocytopenia ([place]) [D69.6] 07/11/2015  . Hyponatremia [E87.1] 07/11/2015  . Alcoholic hepatitis with ascites [K70.11]   . Alcoholic hepatitis [Y60.63] 07/09/2015  . Alcohol intoxication ([place]) [F10.129] 07/09/2015  . Withdrawal seizures ([place]) [K16.010, R56.9] 07/01/2015  . Benzodiazepine abuse [F13.10] 07/01/2015  . Drug withdrawal seizure ([place]) [X32.355, R56.9] 06/21/2015  . Chest pain [R07.9] 05/20/2015  . GAD (generalized anxiety disorder) [F41.1] 04/17/2015  . Panic disorder [F41.0] 04/17/2015  . Alcohol withdrawal ([place]) [F10.239] 04/16/2015  . Abdominal pain, generalized [R10.84] 04/16/2015  . Vomiting and diarrhea [R11.10, R19.7]   . Failure to thrive in adult [R62.7]   . Intractable nausea and vomiting [R11.10] 03/01/2015  . Hypokalemia [E87.6] 03/01/2015  . Protein-calorie malnutrition, severe ([place]) [E43] 01/05/2015  . Generalized anxiety disorder [F41.1] 11/26/2014    Class: Chronic  . Alcohol use disorder, severe, dependence ([place]) [F10.20] 11/25/2014  . Adnexal mass [N94.9]  04/10/2012    Total Time spent with patient: 1 hour  Subjective:   Kristine Macias is a 30 y.o. female patient admitted with "I'm okay. I'm pregnant".  HPI:  Update as of Friday the 14th. Patient seen chart reviewed. Patient says that she is still having some perceptual disturbances. Not frank hallucinations but things still look funny and she still feels like she's having unclear thinking. Mood is not feeling particularly depressed there is no suicidality. She was lucid in her conversation. Vitals appear to be stable. Patient is being gradually tapered on Ativan to make sure that she gets through the worst of the detox. Looks like she may be ready for discharge in the next couple days. She is already getting outpatient treatment through [facility]. She seems to have a realistic view of her pregnancy.  Social history: Patient is living with her mother and her other child. She feels positive about the pregnancy. Says the father of the pregnancy is actually positively involved in her life.  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 Has had multiple seizures in the past almost certainly related to alcohol withdrawal although she has other medical problems that could be contributing.  Substance abuse history: Long-standing alcohol abuse. In my encounters with her I found her to be quite resistant often to being honest about her full alcohol abuse problem and very much motivated to avoid talking about it and engaging in treatment.  Past Psychiatric History: Patient has had a history of anxiety symptoms in the past. Has been treated with antidepressant medicines and occasionally have benzodiazepines. Denies serious suicide attempts. Tends to focus on the symptoms at the expense of her substance abuse  Risk to Self: Is patient at risk for suicide?:  No Risk to Others:   Prior Inpatient Therapy:   Prior  Outpatient Therapy:    Past Medical History:  Past Medical History  Diagnosis Date  . Depression with anxiety initally at age 54  . Chlamydia 2007    bacterial vaginosis 09/2011  . Irregular heart beat 2010    wt/diet related after evaluation  . Renal disorder   . Pneumothorax, spontaneous, tension   . Alcohol abuse 11/2014    drinking since age 30. chronic, recurrent. at least 2 detox admits before 2015.   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   . Failure to thrive in adult 12/2014.     Malnutrition: n/v, not eating, weight loss, BMI 14.  s/p 01/11/2015 PEG (Dr Dorna Leitz).   . Alcoholic hepatitis 11/4965    hepatic steatosis on 11/2014 ultrasound.   . Seizure ([place]) 06/2015    due to ETOH/benzo withdrawal.   . Thrombocytopenia ([place]) 04/2007  . Colitis 12/2014    colonoscopy for diarrhea and abnml CT 01/06/15: erythmatous TI (path: active ileitis, ? emerging IBD), rectal erythema (path: mucosal prolapse). Random bx of normal colon (path unremarkable)   . Spontaneous pneumothorax 08/2012    right.  chest tube placed.     Past Surgical History  Procedure Laterality Date  . Cesarean section  04/2007, 07/2009     x 2.  G3, Para 2012 as of 07/2009.   Marland Kitchen Chest tube insertion    . Esophagogastroduodenoscopy N/A 01/06/2015    ;[facility], Dr Rayann Heman. For wt loss, N/V: Normal study, duodenal biopsy/pathology: chronic active duodenitis.   . Colonoscopy with propofol N/A 01/06/2015    [facility], Dr Rayann Heman. colonoscopy for diarrhea and abnml CT 01/06/15: erythmatous TI (path: active ileitis, ? emerging IBD), rectal erythema (path: mucosal prolapse). Random bx of normal colon (path unremarkable)   . Peg placement N/A 01/11/2015    [facility], Dr Rayann Heman.  for N/V/wt loss/severe malnutrition.   . Esophagogastroduodenoscopy N/A 01/11/2015    Rein-normal with PEG placement   Family History:  Family History  Problem Relation Age of Onset  . Anesthesia problems Neg Hx   . Thyroid disease Mother   . Cancer Father   . Stroke  Other   . Crohn's disease Brother    Family Psychiatric  History: Positive for anxiety and mood problems Social History:  History  Alcohol Use  . 21.6 oz/week  . 55 Cans of beer per week    Comment: 6 beers/day; trying to quit, participating in AA     History  Drug Use No    Social History   Social History  . Marital Status: Single    Spouse Name: N/A  . Number of Children: N/A  . Years of Education: N/A   Social History Main Topics  . Smoking status: Current Every Day Smoker -- 2.00 packs/day    Types: Cigarettes  . Smokeless tobacco: Never Used  . Alcohol Use: 21.6 oz/week    36 Cans of beer per week     Comment: 6 beers/day; trying to quit, participating in [place]  . Drug Use: No  . Sexual Activity: Yes    Birth Control/ Protection: None   Other Topics Concern  . None   Social History Narrative   Lives with mother & 2 children   disabled   Additional Social History:    Allergies:   Allergies  Allergen Reactions  . Aspirin Shortness Of Breath  . Effexor [Venlafaxine] Anaphylaxis  . Sulfa  Antibiotics Anaphylaxis  . Tetracyclines & Related Anaphylaxis  . Trazodone And Nefazodone Anaphylaxis  . Latex Hives and Itching  . Paxil [Paroxetine] Hives and Itching  . Seroquel [Quetiapine Fumarate] Hives and Itching  . Zoloft [Sertraline Hcl] Hives and Itching    Labs:  Results for orders placed or performed during the hospital encounter of 12/07/15 (from the past 48 hour(s))  hCG, quantitative, pregnancy     Status: Abnormal   Collection Time: 12/07/15  5:02 PM  Result Value Ref Range   hCG, Beta Chain, Quant, S 126633 (H) <5 mIU/mL    Comment:          GEST. AGE      CONC.  (mIU/mL)   <=1 WEEK        5 - 50     2 WEEKS       50 - 500     3 WEEKS       100 - 10,000     4 WEEKS     1,000 - 30,000     5 WEEKS     3,500 - 115,000   6-8 WEEKS     12,000 - 270,000    12 WEEKS     15,000 - 220,000        FEMALE AND NON-PREGNANT FEMALE:     LESS THAN 5 mIU/mL    Urinalysis complete, with microscopic     Status: Abnormal   Collection Time: 12/07/15  5:02 PM  Result Value Ref Range   Color, Urine STRAW (A) YELLOW   APPearance CLEAR (A) CLEAR   Glucose, UA NEGATIVE NEGATIVE mg/dL   Bilirubin Urine NEGATIVE NEGATIVE   Ketones, ur NEGATIVE NEGATIVE mg/dL   Specific Gravity, Urine 1.003 (L) 1.005 - 1.030   Hgb urine dipstick NEGATIVE NEGATIVE   pH 6.0 5.0 - 8.0   Protein, ur NEGATIVE NEGATIVE mg/dL   Nitrite NEGATIVE NEGATIVE   Leukocytes, UA NEGATIVE NEGATIVE   RBC / HPF NONE SEEN 0 - 5 RBC/hpf   WBC, UA 0-5 0 - 5 WBC/hpf   Bacteria, UA RARE (A) NONE SEEN   Squamous Epithelial / LPF 0-5 (A) NONE SEEN   Hyaline Casts, UA PRESENT   Urine Drug Screen, Qualitative     Status: Abnormal   Collection Time: 12/07/15  5:02 PM  Result Value Ref Range   Tricyclic, Ur Screen NONE DETECTED NONE DETECTED   Amphetamines, Ur Screen NONE DETECTED NONE DETECTED   MDMA (Ecstasy)Ur Screen NONE DETECTED NONE DETECTED   Cocaine Metabolite,Ur [place] NONE DETECTED NONE DETECTED   Opiate, Ur Screen NONE DETECTED NONE DETECTED   Phencyclidine (PCP) Ur S NONE DETECTED NONE DETECTED   Cannabinoid 50 Ng, Ur Early NONE DETECTED NONE DETECTED   Barbiturates, Ur Screen NONE DETECTED NONE DETECTED   Benzodiazepine, Ur Scrn POSITIVE (A) NONE DETECTED   Methadone Scn, Ur NONE DETECTED NONE DETECTED    Comment: (NOTE) 169  Tricyclics, urine               Cutoff 1000 ng/mL 200  Amphetamines, urine             Cutoff 1000 ng/mL 300  MDMA (Ecstasy), urine           Cutoff 500 ng/mL 400  Cocaine Metabolite, urine       Cutoff 300 ng/mL 500  Opiate, urine                   Cutoff 300 ng/mL 600  Phencyclidine (PCP), urine      Cutoff 25 ng/mL 700  Cannabinoid, urine              Cutoff 50 ng/mL 800  Barbiturates, urine             Cutoff 200 ng/mL 900  Benzodiazepine, urine           Cutoff 200 ng/mL 1000 Methadone, urine                Cutoff 300 ng/mL 1100 1200 The urine drug  screen provides only a preliminary, unconfirmed 1300 analytical test result and should not be used for non-medical 1400 purposes. Clinical consideration and professional judgment should 1500 be applied to any positive drug screen result due to possible 1600 interfering substances. A more specific alternate chemical method 1700 must be used in order to obtain a confirmed analytical result.  1800 Gas chromato graphy / mass spectrometry (GC/MS) is the preferred 1900 confirmatory method.   Basic metabolic panel     Status: Abnormal   Collection Time: 12/07/15  5:02 PM  Result Value Ref Range   Sodium 132 (L) 135 - 145 mmol/L   Potassium 3.5 3.5 - 5.1 mmol/L    Comment: HEMOLYSIS AT THIS LEVEL MAY AFFECT RESULT   Chloride 97 (L) 101 - 111 mmol/L   CO2 24 22 - 32 mmol/L   Glucose, Bld 110 (H) 65 - 99 mg/dL   BUN 14 6 - 20 mg/dL   Creatinine, Ser 0.45 0.44 - 1.00 mg/dL   Calcium 8.6 (L) 8.9 - 10.3 mg/dL   GFR calc non Af Amer >60 >60 mL/min   GFR calc Af Amer >60 >60 mL/min    Comment: (NOTE) The eGFR has been calculated using the CKD EPI equation. This calculation has not been validated in all clinical situations. eGFR's persistently <60 mL/min signify possible Chronic Kidney Disease.    Anion gap 11 5 - 15  Ethanol     Status: Abnormal   Collection Time: 12/07/15  5:02 PM  Result Value Ref Range   Alcohol, Ethyl (B) 288 (H) <5 mg/dL    Comment:        LOWEST DETECTABLE LIMIT FOR SERUM ALCOHOL IS 5 mg/dL FOR MEDICAL PURPOSES ONLY   CBC with Differential     Status: Abnormal   Collection Time: 12/07/15  5:02 PM  Result Value Ref Range   WBC 3.8 3.6 - 11.0 K/uL   RBC 2.62 (L) 3.80 - 5.20 MIL/uL   Hemoglobin 8.1 (L) 12.0 - 16.0 g/dL   HCT 24.7 (L) 35.0 - 47.0 %   MCV 94.0 80.0 - 100.0 fL   MCH 30.9 26.0 - 34.0 pg   MCHC 32.9 32.0 - 36.0 g/dL   RDW 15.9 (H) 11.5 - 14.5 %   Platelets 128 (L) 150 - 440 K/uL   Neutrophils Relative % 67 %   Neutro Abs 2.6 1.4 - 6.5 K/uL    Lymphocytes Relative 23 %   Lymphs Abs 0.9 (L) 1.0 - 3.6 K/uL   Monocytes Relative 8 %   Monocytes Absolute 0.3 0.2 - 0.9 K/uL   Eosinophils Relative 1 %   Eosinophils Absolute 0.0 0 - 0.7 K/uL   Basophils Relative 1 %   Basophils Absolute 0.0 0 - 0.1 K/uL  Pregnancy, urine POC     Status: Abnormal   Collection Time: 12/07/15  5:06 PM  Result Value Ref Range   Preg Test, Ur POSITIVE (A) NEGATIVE  Comment:        THE SENSITIVITY OF THIS METHODOLOGY IS >24 mIU/mL     Current Facility-Administered Medications  Medication Dose Route Frequency Provider Last Rate Last Dose  . acetaminophen (TYLENOL) tablet 650 mg  650 mg Oral Q6H PRN Fritzi Mandes, MD       Or  . acetaminophen (TYLENOL) suppository 650 mg  650 mg Rectal Q6H PRN Fritzi Mandes, MD      . folic acid (FOLVITE) tablet 1 mg  1 mg Oral Daily Fritzi Mandes, MD   1 mg at 12/09/15 0905  . guaiFENesin (ROBITUSSIN) 100 MG/5ML solution 100 mg  5 mL Oral Q4H PRN Fritzi Mandes, MD   100 mg at 12/08/15 0136  . LORazepam (ATIVAN) tablet 1 mg  1 mg Oral Q6H PRN Lytle Butte, MD   1 mg at 12/09/15 6237   Or  . LORazepam (ATIVAN) injection 1 mg  1 mg Intravenous Q6H PRN Lytle Butte, MD      . ondansetron [facility]) tablet 4 mg  4 mg Oral Q6H PRN Fritzi Mandes, MD   4 mg at 12/08/15 0120   Or  . ondansetron (ZOFRAN) injection 4 mg  4 mg Intravenous Q6H PRN Fritzi Mandes, MD      . prenatal multivitamin tablet 1 tablet  1 tablet Oral Q1200 Lytle Butte, MD   1 tablet at 12/09/15 1120  . thiamine (VITAMIN B-1) tablet 100 mg  100 mg Oral Daily Fritzi Mandes, MD   100 mg at 12/09/15 6283   Or  . thiamine (B-1) injection 100 mg  100 mg Intravenous Daily Fritzi Mandes, MD        Musculoskeletal: Strength & Muscle Tone: decreased Gait & Station: ataxic Patient leans: N/A  Psychiatric Specialty Exam: Review of Systems  Constitutional: Negative.   HENT: Negative.   Eyes: Negative.   Respiratory: Negative.   Cardiovascular: Negative.   Gastrointestinal:  Negative.   Musculoskeletal: Negative.   Skin: Negative.   Neurological: Negative.   Psychiatric/Behavioral: Positive for substance abuse. Negative for depression, suicidal ideas, hallucinations and memory loss. The patient is nervous/anxious. The patient does not have insomnia.     Blood pressure 101/59, pulse 67, temperature 98.2 F (36.8 C), temperature source Oral, resp. rate 18, height 5' 6"  (1.676 m), weight 46.947 kg (103 lb 8 oz), SpO2 96 %.Body mass index is 16.71 kg/(m^2).  General Appearance: Disheveled  Engineer, water::  Fair  Speech:  Normal Rate  Volume:  Decreased  Mood:  Euthymic  Affect:  Constricted  Thought Process:  Goal Directed  Orientation:  Full (Time, Place, and Person)  Thought Content:  Negative  Suicidal Thoughts:  No  Homicidal Thoughts:  No  Memory:  Immediate;   Good Recent;   Fair Remote;   Fair  Judgement:  Fair  Insight:  Fair  Psychomotor Activity:  Decreased  Concentration:  Fair  Recall:  [employer] of Knowledge:Fair  Language: Fair  Akathisia:  No  Handed:  Right  AIMS (if indicated):     Assets:  Desire for Improvement Housing Social Support  ADL's:  Intact  Cognition: WNL  Sleep:      Treatment Plan Summary: Plan Mental status appears about the same today as yesterday. She is awake and lucid and calm. She has some insight into her alcohol withdrawal symptoms. Vitals are stable. Medicine seems to be helping get her through and she is not having full-blown DTs. Spent a little time talking with  her about the crucial importance of continuing to go to [place] and really engaging in substance abuse treatment with a focus on the importance of staying sober and healthy for the pregnancy. Patient expresses understanding and is agreeable. No change to medication. If she still here after the weekend I will continue to follow-up.  Disposition: Patient does not meet criteria for psychiatric inpatient admission.  Alethia Berthold, MD 12/09/2015 2:27  PM

## 2015-12-10 MED ORDER — PRENATAL MULTIVITAMIN CH: 1.0000 | ORAL_TABLET | Freq: Every day | ORAL | Status: DC

## 2015-12-10 MED ORDER — ONDANSETRON HCL 4 MG PO TABS: 4.0000 mg | ORAL_TABLET | Freq: Four times a day (QID) | ORAL | Status: DC | PRN

## 2015-12-10 NOTE — Progress Notes (Signed)
Per dr. Lavetta Nielsen, patient can come off tele for shower prior to discharge.

## 2015-12-10 NOTE — Discharge Summary (Signed)
Kristine Macias    MR#:  [id number]  DATE OF BIRTH:  [birth year]  DATE OF ADMISSION:  12/07/2015 ADMITTING PHYSICIAN: Fritzi Mandes, MD  DATE OF DISCHARGE: 12/10/2015  PRIMARY CARE PHYSICIAN: Leonides Sake, MD    ADMISSION DIAGNOSIS:  First trimester pregnancy [Z33.1] Seizure due to alcohol withdrawal, uncomplicated ([place]) [U54.270]  DISCHARGE DIAGNOSIS:  Principal Problem:   Alcohol use disorder, severe, dependence ([place]) Active Problems:   Protein-calorie malnutrition, severe ([place])   Alcohol withdrawal seizure ([place])   SECONDARY DIAGNOSIS:   Past Medical History  Diagnosis Date  . Depression with anxiety initally at age 57  . Chlamydia 2007    bacterial vaginosis 09/2011  . Irregular heart beat 2010    wt/diet related after evaluation  . Renal disorder   . Pneumothorax, spontaneous, tension   . Alcohol abuse 11/2014    drinking since age 89. chronic, recurrent. at least 2 detox admits before 2015.   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   . Failure to thrive in adult 12/2014.     Malnutrition: n/v, not eating, weight loss, BMI 14.  s/p 01/11/2015 PEG (Dr Dorna Leitz).   . Alcoholic hepatitis 01/2375    hepatic steatosis on 11/2014 ultrasound.   . Seizure ([place]) 06/2015    due to ETOH/benzo withdrawal.   . Thrombocytopenia ([place]) 04/2007  . Colitis 12/2014    colonoscopy for diarrhea and abnml CT 01/06/15: erythmatous TI (path: active ileitis, ? emerging IBD), rectal erythema (path: mucosal prolapse). Random bx of normal colon (path unremarkable)   . Spontaneous pneumothorax 08/2012    right.  chest tube placed.     HOSPITAL COURSE:  Kristine Macias  is a 30 y.o. female admitted 12/07/2015 with chief complaint Seizures . Please see H&P performed by Fritzi Mandes, MD for further information. Admitted after a seizure secondary to alcohol withdrawal. She was placed on CIWA and psychiatry became involved in  her care to assist in withdrawal symptoms. She found out she was pregnant in the ED - about 7 weeks, started on prenatals and the importance of abstaining from alcohol.   DISCHARGE CONDITIONS:   stable  CONSULTS OBTAINED:  Treatment Team:  Lytle Butte, MD Gonzella Lex, MD  DRUG ALLERGIES:   Allergies  Allergen Reactions  . Aspirin Shortness Of Breath  . Effexor [Venlafaxine] Anaphylaxis  . Sulfa Antibiotics Anaphylaxis  . Tetracyclines & Related Anaphylaxis  . Trazodone And Nefazodone Anaphylaxis  . Latex Hives and Itching  . Paxil [Paroxetine] Hives and Itching  . Seroquel [Quetiapine Fumarate] Hives and Itching  . Zoloft [Sertraline Hcl] Hives and Itching    DISCHARGE MEDICATIONS:   Current Discharge Medication List    START taking these medications   Details  ondansetron (ZOFRAN) 4 MG tablet Take 1 tablet (4 mg total) by mouth every 6 (six) hours as needed for nausea. Qty: 20 tablet, Refills: 0    Prenatal Vit-Fe Fumarate-FA (PRENATAL MULTIVITAMIN) TABS tablet Take 1 tablet by mouth daily at 12 noon. Qty: 30 tablet, Refills: 0      CONTINUE these medications which have NOT CHANGED   Details  clonazePAM (KLONOPIN) 0.25 MG disintegrating tablet Take 0.25 mg by mouth 2 (two) times daily. Refills: 1         DISCHARGE INSTRUCTIONS:    DIET:  Regular diet  DISCHARGE CONDITION:  Stable  ACTIVITY:  Activity as tolerated  OXYGEN:  Home Oxygen: No.   Oxygen Delivery: room air  DISCHARGE LOCATION:  home   If you experience worsening of your admission symptoms, develop shortness of breath, life threatening emergency, suicidal or homicidal thoughts you must seek medical attention immediately by calling 911 or calling your MD immediately  if symptoms less severe.  You Must read complete instructions/literature along with all the possible adverse reactions/side effects for all the Medicines you take and that have been prescribed to you. Take any new  Medicines after you have completely understood and accpet all the possible adverse reactions/side effects.   Please note  You were cared for by a hospitalist during your hospital stay. If you have any questions about your discharge medications or the care you received while you were in the hospital after you are discharged, you can call the unit and asked to speak with the hospitalist on call if the hospitalist that took care of you is not available. Once you are discharged, your primary care physician will handle any further medical issues. Please note that NO REFILLS for any discharge medications will be authorized once you are discharged, as it is imperative that you return to your primary care physician (or establish a relationship with a primary care physician if you do not have one) for your aftercare needs so that they can reassess your need for medications and monitor your lab values.    On the day of Discharge:   VITAL SIGNS:  Blood pressure 97/62, pulse 81, temperature 98.5 F (36.9 C), temperature source Oral, resp. rate 20, height 5' 6"  (1.676 m), weight 46.947 kg (103 lb 8 oz), SpO2 99 %.  I/O:   Intake/Output Summary (Last 24 hours) at 12/10/15 1107 Last data filed at 12/10/15 1013  Gross per 24 hour  Intake    480 ml  Output   2550 ml  Net  -2070 ml    PHYSICAL EXAMINATION:  GENERAL:  30 y.o.-year-old patient lying in the bed with no acute distress.  EYES: Pupils equal, round, reactive to light and accommodation. No scleral icterus. Extraocular muscles intact.  HEENT: Head atraumatic, normocephalic. Oropharynx and nasopharynx clear.  NECK:  Supple, no jugular venous distention. No thyroid enlargement, no tenderness.  LUNGS: Normal breath sounds bilaterally, no wheezing, rales,rhonchi or crepitation. No use of accessory muscles of respiration.  CARDIOVASCULAR: S1, S2 normal. No murmurs, rubs, or gallops.  ABDOMEN: Soft, non-tender, non-distended. Bowel sounds present. No  organomegaly or mass.  EXTREMITIES: No pedal edema, cyanosis, or clubbing.  NEUROLOGIC: Cranial nerves II through XII are intact. Muscle strength 5/5 in all extremities. Sensation intact. Gait not checked.  PSYCHIATRIC: The patient is alert and oriented x 3.  SKIN: No obvious rash, lesion, or ulcer.   DATA REVIEW:   CBC  Recent Labs Lab 12/07/15 1702  WBC 3.8  HGB 8.1*  HCT 24.7*  PLT 128*    Chemistries   Recent Labs Lab 12/07/15 1702  NA 132*  K 3.5  CL 97*  CO2 24  GLUCOSE 110*  BUN 14  CREATININE 0.45  CALCIUM 8.6*    Cardiac Enzymes No results for input(s): TROPONINI in the last 168 hours.  Microbiology Results  Results for orders placed or performed during the hospital encounter of 10/14/15  Body fluid culture     Status: None   Collection Time: 10/19/15  2:30 PM  Result Value Ref Range Status   Specimen Description PERITONEAL  Final   Special Requests NONE  Final   Gram  Stain FEW WBC SEEN NO ORGANISMS SEEN   Final   Culture No growth aerobically or anaerobically.  Final   Report Status 10/23/2015 FINAL  Final    RADIOLOGY:  No results found.   Management plans discussed with the patient, family and they are in agreement.  CODE STATUS:     Code Status Orders        Start     Ordered   12/08/15 0040  Full code   Continuous     12/08/15 0039    Code Status History    Date Active Date Inactive Code Status Order ID Comments User Context   10/14/2015  8:31 PM 10/22/2015  8:29 PM Full Code [id number]  Vaughan Basta, MD Inpatient   10/05/2015  3:34 AM 10/05/2015  5:53 PM Full Code [id number]  Lance Coon, MD ED   08/08/2015  5:52 PM 08/10/2015  8:21 PM Full Code [id number]  Elmarie Shiley, MD Inpatient   07/09/2015  6:11 PM 07/14/2015  5:56 PM Full Code [id number]  Theodis Blaze, MD Inpatient   07/01/2015 12:32 AM 07/02/2015  5:52 PM Full Code [id number]  Lytle Butte, MD ED   06/21/2015 12:25 PM 06/22/2015  4:08 PM Full Code [id number]   Epifanio Lesches, MD ED   05/20/2015  7:56 PM 05/23/2015  3:55 PM Full Code [id number]  Fritzi Mandes, MD Inpatient   04/16/2015  9:21 PM 04/18/2015  4:55 PM Full Code [id number]  Idelle Crouch, MD Inpatient   03/01/2015  8:52 PM 03/03/2015  4:05 PM Full Code [id number]  Lytle Butte, MD ED   01/04/2015  7:23 PM 01/13/2015  7:12 PM Full Code [id number]  Fritzi Mandes, MD ED   11/24/2014  6:21 PM 11/26/2014  1:29 PM Full Code [id number]  Serita Grit, MD ED      TOTAL TIME TAKING CARE OF THIS PATIENT: 28 minutes.    Daneil Beem,  Karenann Cai.D on 12/10/2015 at 11:07 AM  Between 7am to 6pm - Pager - [phone]  After 6pm go to [email or web address] - Proofreader  [facility]  Office  [phone]  CC: Primary care physician; Leonides Sake, MD

## 2015-12-10 NOTE — Progress Notes (Signed)
Pt. Discharged to home via wc. Discharge instructions and medication regimen reviewed at bedside with patient and her fiance. They both verbalize understanding of instructions and medication regimen. Prescriptions included with d/c papers. Patient assessment unchanged from this morning. TELE and IV discontinued per policy.

## 2015-12-16 ENCOUNTER — Emergency Department (HOSPITAL_COMMUNITY): Payer: Medicaid Other

## 2015-12-16 ENCOUNTER — Encounter (HOSPITAL_COMMUNITY): Payer: Self-pay | Admitting: Emergency Medicine

## 2015-12-16 ENCOUNTER — Emergency Department (HOSPITAL_COMMUNITY)
Admission: EM | Admit: 2015-12-16 | Discharge: 2015-12-16 | Disposition: A | Discharge status: Home / Self Care | Payer: Medicaid Other | Attending: Emergency Medicine | Admitting: Emergency Medicine

## 2015-12-16 DIAGNOSIS — O99341 Other mental disorders complicating pregnancy, first trimester: Secondary | ICD-10-CM | POA: Diagnosis not present

## 2015-12-16 DIAGNOSIS — F1010 Alcohol abuse, uncomplicated: Secondary | ICD-10-CM

## 2015-12-16 DIAGNOSIS — Z8709 Personal history of other diseases of the respiratory system: Secondary | ICD-10-CM | POA: Insufficient documentation

## 2015-12-16 DIAGNOSIS — F10920 Alcohol use, unspecified with intoxication, uncomplicated: Secondary | ICD-10-CM

## 2015-12-16 DIAGNOSIS — Z79899 Other long term (current) drug therapy: Secondary | ICD-10-CM | POA: Insufficient documentation

## 2015-12-16 DIAGNOSIS — O9989 Other specified diseases and conditions complicating pregnancy, childbirth and the puerperium: Secondary | ICD-10-CM | POA: Insufficient documentation

## 2015-12-16 DIAGNOSIS — F10120 Alcohol abuse with intoxication, uncomplicated: Secondary | ICD-10-CM | POA: Insufficient documentation

## 2015-12-16 DIAGNOSIS — Z3490 Encounter for supervision of normal pregnancy, unspecified, unspecified trimester: Secondary | ICD-10-CM

## 2015-12-16 DIAGNOSIS — O99311 Alcohol use complicating pregnancy, first trimester: Secondary | ICD-10-CM | POA: Diagnosis present

## 2015-12-16 DIAGNOSIS — Z3A08 8 weeks gestation of pregnancy: Secondary | ICD-10-CM | POA: Insufficient documentation

## 2015-12-16 DIAGNOSIS — O99331 Smoking (tobacco) complicating pregnancy, first trimester: Secondary | ICD-10-CM | POA: Insufficient documentation

## 2015-12-16 DIAGNOSIS — Z8619 Personal history of other infectious and parasitic diseases: Secondary | ICD-10-CM | POA: Insufficient documentation

## 2015-12-16 DIAGNOSIS — R569 Unspecified convulsions: Secondary | ICD-10-CM | POA: Diagnosis not present

## 2015-12-16 DIAGNOSIS — Z8719 Personal history of other diseases of the digestive system: Secondary | ICD-10-CM | POA: Insufficient documentation

## 2015-12-16 DIAGNOSIS — F17210 Nicotine dependence, cigarettes, uncomplicated: Secondary | ICD-10-CM | POA: Diagnosis not present

## 2015-12-16 DIAGNOSIS — Z862 Personal history of diseases of the blood and blood-forming organs and certain disorders involving the immune mechanism: Secondary | ICD-10-CM | POA: Diagnosis not present

## 2015-12-16 DIAGNOSIS — Z91040 Latex allergy status: Secondary | ICD-10-CM | POA: Insufficient documentation

## 2015-12-16 DIAGNOSIS — F418 Other specified anxiety disorders: Secondary | ICD-10-CM | POA: Insufficient documentation

## 2015-12-16 DIAGNOSIS — R1030 Lower abdominal pain, unspecified: Secondary | ICD-10-CM | POA: Insufficient documentation

## 2015-12-16 DIAGNOSIS — R109 Unspecified abdominal pain: Secondary | ICD-10-CM

## 2015-12-16 LAB — COMPREHENSIVE METABOLIC PANEL
ALT: 65 U/L — AB (ref 14–54)
ANION GAP: 12 (ref 5–15)
AST: 114 U/L — ABNORMAL HIGH (ref 15–41)
Albumin: 3.2 g/dL — ABNORMAL LOW (ref 3.5–5.0)
Alkaline Phosphatase: 90 U/L (ref 38–126)
BUN: 5 mg/dL — ABNORMAL LOW (ref 6–20)
CHLORIDE: 106 mmol/L (ref 101–111)
CO2: 24 mmol/L (ref 22–32)
Calcium: 8.3 mg/dL — ABNORMAL LOW (ref 8.9–10.3)
Creatinine, Ser: 0.37 mg/dL — ABNORMAL LOW (ref 0.44–1.00)
GFR calc non Af Amer: >60 mL/min (ref >60)
Glucose, Bld: 98 mg/dL (ref 65–99)
POTASSIUM: 3.5 mmol/L (ref 3.5–5.1)
SODIUM: 142 mmol/L (ref 135–145)
Total Bilirubin: <0.1 mg/dL — ABNORMAL LOW (ref 0.3–1.2)
Total Protein: 7.1 g/dL (ref 6.5–8.1)

## 2015-12-16 LAB — URINALYSIS, ROUTINE W REFLEX MICROSCOPIC
BILIRUBIN URINE: NEGATIVE
Glucose, UA: NEGATIVE mg/dL
HGB URINE DIPSTICK: NEGATIVE
Ketones, ur: NEGATIVE mg/dL
Leukocytes, UA: NEGATIVE
Nitrite: NEGATIVE
PH: 7 (ref 5.0–8.0)
Protein, ur: NEGATIVE mg/dL
SPECIFIC GRAVITY, URINE: 1.007 (ref 1.005–1.030)

## 2015-12-16 LAB — RAPID URINE DRUG SCREEN, HOSP PERFORMED
AMPHETAMINES: NOT DETECTED
Barbiturates: NOT DETECTED
Benzodiazepines: POSITIVE — AB
COCAINE: NOT DETECTED
OPIATES: NOT DETECTED
TETRAHYDROCANNABINOL: NOT DETECTED

## 2015-12-16 LAB — HCG, QUANTITATIVE, PREGNANCY: hCG, Beta Chain, Quant, S: 174080 m[IU]/mL — ABNORMAL HIGH (ref <5)

## 2015-12-16 LAB — CBC WITH DIFFERENTIAL/PLATELET
Basophils Absolute: 0 10*3/uL (ref 0.0–0.1)
Basophils Relative: 0 %
EOS ABS: 0.1 10*3/uL (ref 0.0–0.7)
EOS PCT: 1 %
HCT: 38 % (ref 36.0–46.0)
Hemoglobin: 12.6 g/dL (ref 12.0–15.0)
LYMPHS ABS: 2.9 10*3/uL (ref 0.7–4.0)
Lymphocytes Relative: 38 %
MCH: 30.8 pg (ref 26.0–34.0)
MCHC: 33.2 g/dL (ref 30.0–36.0)
MCV: 92.9 fL (ref 78.0–100.0)
Monocytes Absolute: 0.5 10*3/uL (ref 0.1–1.0)
Monocytes Relative: 7 %
Neutro Abs: 4.1 10*3/uL (ref 1.7–7.7)
Neutrophils Relative %: 54 %
PLATELETS: 275 10*3/uL (ref 150–400)
RBC: 4.09 MIL/uL (ref 3.87–5.11)
RDW: 15.1 % (ref 11.5–15.5)
WBC: 7.6 10*3/uL (ref 4.0–10.5)

## 2015-12-16 LAB — ETHANOL: Alcohol, Ethyl (B): 388 mg/dL (ref <5)

## 2015-12-16 LAB — POC URINE PREG, ED: Preg Test, Ur: POSITIVE — AB

## 2015-12-16 MED ORDER — LORAZEPAM 2 MG/ML IJ SOLN
1.0000 mg | Freq: Once | INTRAMUSCULAR | Status: AC
  Administered 2015-12-16: 1 mg via INTRAVENOUS
  Filled 2015-12-16: qty 1

## 2015-12-16 MED ORDER — LORAZEPAM 1 MG PO TABS: 0.0000 mg | ORAL_TABLET | Freq: Two times a day (BID) | ORAL | Status: DC

## 2015-12-16 MED ORDER — CHLORDIAZEPOXIDE HCL 25 MG PO CAPS: ORAL_CAPSULE | ORAL | Status: DC

## 2015-12-16 MED ORDER — LORAZEPAM 1 MG PO TABS: 0.0000 mg | ORAL_TABLET | Freq: Four times a day (QID) | ORAL | Status: DC

## 2015-12-16 NOTE — ED Notes (Signed)
Dr. Dina Rich notified on elevated alcohol level .

## 2015-12-16 NOTE — ED Notes (Signed)
BREAKFAST TRAY ORDERED AT 0548--LESLIE

## 2015-12-16 NOTE — Clinical Social Work Note (Cosign Needed)
Clinical Social Work Assessment  Patient Details  Name: Kristine Macias MRN: [id number] Date of Birth: 01-Sep-1985  Date of referral:  12/16/15               Reason for consult:  Substance Use/ETOH Abuse, Community Resources, Emotional/Coping/Adjustment to Illness                Permission sought to share information with:    Permission granted to share information::  No  Name::        Agency::     Relationship::     Contact Information:     Housing/Transportation Living arrangements for the past 2 months:  Single Family Home Source of Information:  Patient, Medical Team Patient Interpreter Needed:  None Criminal Activity/Legal Involvement Pertinent to Current Situation/Hospitalization:  No - Comment as needed Significant Relationships:  Parents Lives with:  Parents, Significant Other, Minor Children Do you feel safe going back to the place where you live?  Yes Need for family participation in patient care:  No (Coment)  Care giving concerns:  Patient lives with her mother, fiance, and her two minor children ages 23 YO and 47 YO. Patient is also [redacted] weeks pregnant. Patient currently abuses alcohol. Patient reports that she can return home with no barriers. There is concern that Patient is unable to adequately care for her children due to alcoholism.    Social Worker assessment / plan:  CSW engaged with Patient at Patient's bedside. CSW introduced self and role of CSW. CSW also provided emotional support and supportive counseling. Patient was difficult to arouse and lethargic. CSW discussed substance abuse resources specifically for pregnant women and emphasized the importance of maintaining an alcohol free pregnancy not only for the baby's health but for the Patient's health as well. CSW also provided patient with community resources and list of residential and outpatient substance abuse facilities. Patient reports that she is a patient of [facility] substance abuse  program in [place], [place]. She reports that she attends group 2x a week. Patient also reports that she attends [place] meetings. Patient is open to resources but is not interested in residential substance abuse treatment.  Patient denies any other needs at this time. Patient plans to carry child to term for this current pregnancy. Patient reports that her family is supportive. At this time, CSW has contacted Olin Hauser with CPS to make report as Patient continues to abuse alcohol while knowingly pregnant. CSW engaged with Claudette Head, CSW at [facility] re: concerns with patient's withdrawal acuity and pregnancy.CSW expressed these concerns with the Patient's RN as EDP was unable at the time. Patient currently does not meet criteria for admission at this time.    Employment status:  Unemployed Forensic scientist:  Medicaid In [place] PT Recommendations:  Not assessed at this time Information / Referral to community resources:  CPS (Comment Required: [place], Name & Number of worker spoken with), Residential Substance Abuse Treatment Options, Outpatient Substance Abuse Treatment Options  Patient/Family's Response to care:  CSW appreciative of CSW's assistance and care received at this time.   Patient/Family's Understanding of and Emotional Response to Diagnosis, Current Treatment, and Prognosis:  Patient verbalizes understanding of current treatment, diagnosis, and prognosis. Patient is not agreeable to residential treatment at this time.   Emotional Assessment Appearance:    Attitude/Demeanor/Rapport:  Guarded, Lethargic Affect (typically observed):  Withdrawn, Guarded, Flat Orientation:  Oriented to Self, Oriented to Place, Oriented to  Time, Oriented to Situation Alcohol / Substance use:  Alcohol  Use Psych involvement (Current and /or in the community):  Yes (Comment)  Discharge Needs  Concerns to be addressed:  Substance Abuse Concerns, Care Coordination, Discharge Planning Concerns Readmission within the last 30  days:  Yes Current discharge risk:  Substance Abuse Barriers to Discharge:  Active Substance Use   Judeth Horn, LCSW 12/16/2015, 9:31 AM

## 2015-12-16 NOTE — ED Provider Notes (Signed)
CSN: [id number]     Arrival date & time 12/16/15  0019 History  By signing my name below, I, Arianna Nassar, attest that this documentation has been prepared under the direction and in the presence of Merryl Hacker, MD. Electronically Signed: Julien Nordmann, ED Scribe. 12/16/2015. 1:10 AM.    Chief Complaint  Patient presents with  . Alcohol Intoxication  . Seizures      The history is provided by the patient. No language interpreter was used.   HPI Comments: Kristine Macias is a 30 y.o. female brought in by ambulance, who has a hx of depression with anxiety, chlamydia, alcoholic hepatitis, seizures, thrombocytopenia, and spontaneous pneumothorax presents to the Emergency Department complaining of sudden onset seizures onset this evening. Pt also complains of sudden onset, gradual worsening, pressure-like abdominal pain with associated headache and nausea. She says she has a sensation that there are things crawling all over her body. Pt notes she is currently [redacted] weeks pregnant (G6P2). She notes having seizures when she withdrawals from alcohol. Pt reports she had a drink yesterday but did not drink today. Last drink 10 AM. Pt normally drinks about about 24 oz of beer a day. She notes she has been trying to stop drinking for the past year. Denies vaginal bleeding, SI, or HI.  Past Medical History  Diagnosis Date  . Depression with anxiety initally at age 3  . Chlamydia 2007    bacterial vaginosis 09/2011  . Irregular heart beat 2010    wt/diet related after evaluation  . Renal disorder   . Pneumothorax, spontaneous, tension   . Alcohol abuse 11/2014    drinking since age 76. chronic, recurrent. at least 2 detox admits before 2015.   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   . Failure to thrive in adult 12/2014.     Malnutrition: n/v, not eating, weight loss, BMI 14.  s/p 01/11/2015 PEG (Dr Dorna Leitz).   . Alcoholic hepatitis 01/7590    hepatic steatosis on 11/2014 ultrasound.   .  Seizure ([place]) 06/2015    due to ETOH/benzo withdrawal.   . Thrombocytopenia ([place]) 04/2007  . Colitis 12/2014    colonoscopy for diarrhea and abnml CT 01/06/15: erythmatous TI (path: active ileitis, ? emerging IBD), rectal erythema (path: mucosal prolapse). Random bx of normal colon (path unremarkable)   . Spontaneous pneumothorax 08/2012    right.  chest tube placed.    Past Surgical History  Procedure Laterality Date  . Cesarean section  04/2007, 07/2009     x 2.  G3, Para 2012 as of 07/2009.   Marland Kitchen Chest tube insertion    . Esophagogastroduodenoscopy N/A 01/06/2015    ;[facility], Dr Rayann Heman. For wt loss, N/V: Normal study, duodenal biopsy/pathology: chronic active duodenitis.   . Colonoscopy with propofol N/A 01/06/2015    [facility], Dr Rayann Heman. colonoscopy for diarrhea and abnml CT 01/06/15: erythmatous TI (path: active ileitis, ? emerging IBD), rectal erythema (path: mucosal prolapse). Random bx of normal colon (path unremarkable)   . Peg placement N/A 01/11/2015    [facility], Dr Rayann Heman.  for N/V/wt loss/severe malnutrition.   . Esophagogastroduodenoscopy N/A 01/11/2015    Rein-normal with PEG placement   Family History  Problem Relation Age of Onset  . Anesthesia problems Neg Hx   . Thyroid disease Mother   . Cancer Father   . Stroke Other   . Crohn's disease Brother    Social History  Substance Use Topics  . Smoking  status: Current Every Day Smoker -- 2.00 packs/day    Types: Cigarettes  . Smokeless tobacco: Never Used  . Alcohol Use: 21.6 oz/week    36 Cans of beer per week     Comment: 6 beers/day; trying to quit, participating in AA   OB History    Gravida Para Term Preterm AB TAB SAB Ectopic Multiple Living   5 2 2  0 2 1 1  0 0 2     Review of Systems  Constitutional: Negative for fever.  Respiratory: Negative for shortness of breath.   Cardiovascular: Negative for chest pain.  Gastrointestinal: Positive for nausea and abdominal pain. Negative for vomiting.  Neurological: Positive for seizures.   All other systems reviewed and are negative.     Allergies  Aspirin; Effexor; Sulfa antibiotics; Tetracyclines & related; Trazodone and nefazodone; Latex; Paxil; Seroquel; and Zoloft  Home Medications   Prior to Admission medications   Medication Sig Start Date End Date Taking? Authorizing Provider  clonazePAM (KLONOPIN) 0.25 MG disintegrating tablet Take 0.25 mg by mouth 2 (two) times daily. 11/10/15   Historical Provider, MD  ondansetron (ZOFRAN) 4 MG tablet Take 1 tablet (4 mg total) by mouth every 6 (six) hours as needed for nausea. 12/10/15   Lytle Butte, MD  Prenatal Vit-Fe Fumarate-FA (PRENATAL MULTIVITAMIN) TABS tablet Take 1 tablet by mouth daily at 12 noon. 12/10/15   Lytle Butte, MD   Triage vitals: BP 104/92 mmHg  Pulse 94  Temp(Src) 98.2 F (36.8 C) (Oral)  Resp 18  SpO2 98%  LMP 07/28/2015 (Approximate) Physical Exam  Constitutional: She is oriented to person, place, and time. No distress.  Disheveled appearing, intoxicated  HENT:  Head: Normocephalic and atraumatic.  Cardiovascular: Normal rate, regular rhythm and normal heart sounds.   No murmur heard. Pulmonary/Chest: Effort normal and breath sounds normal. No respiratory distress. She has no wheezes.  Abdominal: Soft. Bowel sounds are normal. There is tenderness. There is no rebound.  Suprapubic tenderness to palpation without rebound or guarding  Neurological: She is alert and oriented to person, place, and time.  Skin: Skin is warm and dry.  Nursing note and vitals reviewed.   ED Course  Procedures  DIAGNOSTIC STUDIES: Oxygen Saturation is 98% on RA, normal by my interpretation.  COORDINATION OF CARE:  1:10 AM Discussed treatment plan which includes lab work with pt at bedside and pt agreed to plan.  Labs Review Labs Reviewed  COMPREHENSIVE METABOLIC PANEL - Abnormal; Notable for the following:    BUN 5 (*)    Creatinine, Ser 0.37 (*)    Calcium 8.3 (*)    Albumin 3.2 (*)    AST 114 (*)     ALT 65 (*)    Total Bilirubin <0.1 (*)    All other components within normal limits  ETHANOL - Abnormal; Notable for the following:    Alcohol, Ethyl (B) 388 (*)    All other components within normal limits  URINE RAPID DRUG SCREEN, HOSP PERFORMED - Abnormal; Notable for the following:    Benzodiazepines POSITIVE (*)    All other components within normal limits  HCG, QUANTITATIVE, PREGNANCY - Abnormal; Notable for the following:    hCG, Beta Chain, Quant, S [id number] (*)    All other components within normal limits  POC URINE PREG, ED - Abnormal; Notable for the following:    Preg Test, Ur POSITIVE (*)    All other components within normal limits  CBC WITH DIFFERENTIAL/PLATELET  URINALYSIS, ROUTINE  W REFLEX MICROSCOPIC (NOT AT [facility])    Imaging Review US Ob Comp Less 14 Wks  12/16/2015  CLINICAL DATA:  Pregnant patient in first-trimester pregnancy with abdominal pain for 1 day. EXAM: OBSTETRIC <14 WK ULTRASOUND TECHNIQUE: Transabdominal ultrasound was performed for evaluation of the gestation as well as the maternal uterus and adnexal regions. COMPARISON:  Obstetric ultrasound 12/07/2015 FINDINGS: Intrauterine gestational sac: Present. Yolk sac:  Present. Embryo:  Present. Cardiac Activity: Present. Heart Rate: 168 bpm CRL:   20.9  mm   8 w 5 d                  [place] EDC: 07/22/2016 Subchorionic hemorrhage:  None visualized. Maternal uterus/adnexae: Both ovaries are normal. No pelvic free fluid. IMPRESSION: Single live intrauterine pregnancy estimated gestational age [redacted] weeks 5 days for estimated date of delivery 07/22/2016. Interval fetal growth from prior exam. No complication. Electronically Signed   By: Jeb Levering M.D.   On: 12/16/2015 02:17   I have personally reviewed and evaluated these images and lab results as part of my medical decision-making.   EKG Interpretation None      MDM   Final diagnoses:  Abdominal discomfort  Pregnant   Patient presents with reported seizure  activity at home. History of the same in the setting of alcohol withdrawal. She also takes Klonopin daily. She appears acutely intoxicated. Question her recent alcohol intake. No one is here to provide collateral information. She is tender on exam but nonlateralizing. Recent ultrasound with IUP. Will repeat to make sure there is no consultations during pregnancy.  3:00 AM Blood alcohol level is 388. Discussed with the patient that I do not believe that she's been forthcoming with her alcohol intake. She has no objective evidence of withdrawal at this time. She is on Cipro protocol. We'll have TTS evaluate to provide resources for management.  6:07 AM TTS consulted. She does not meet criteria for inpatient management. They will provide resources.  Social work may be able to help. I also discussed the patient with the on-call OB.  Would not recommend Librium taper. Patient is on chronic Klonopin. They do not have additional resources at this time to provide; however, recommend inpatient detox if available.  Will hold until the morning for social work to see the patient. Discussed this with the patient. She is on CIWA protocol.  I personally performed the services described in this documentation, which was scribed in my presence. The recorded information has been reviewed and is accurate.   Merryl Hacker, MD 12/16/15 [phone]

## 2015-12-16 NOTE — ED Notes (Signed)
Pt. currently at ultrasound.

## 2015-12-16 NOTE — ED Notes (Signed)
Spoke with mother and informed her patient is up for discharge, but needs a ride home. Pt. Mother sts someone is on their way to get her at this time.

## 2015-12-16 NOTE — ED Provider Notes (Addendum)
Patient declines any inpatient treatment, rehab, or detox.  Patient is willing to accept outpatient resources/resource guide.  Denies depression or any thoughts of self harm.  CM/SW also indicates patient unwilling to accept inpatient tx.  They have made referral to CPS who will follow up.  They rec d/c to home.   Patient alert, content. No tremor or shakes.  Hr 92, rr 16. Pulse ox 98%. Ambulates w steady gait.  Nurse to contact family/friend for ride home.   Lajean Saver, MD 12/16/15 1044  Recheck, pt states feels shaky, similar to prior withdrawal symptoms.  Pt denies hx complicated etoh withdrawal, dts or seizures.    Pt requests med for shakiness and anxiety/stress.  Ativan 1 mg iv.  Po fluids.  Recheck pt comfortable. No distress. Hr 88. rr 16.    I again offered inpatient detox/rehab.  I discussed dangers to patient and fetus/children.  Pt refuses, requests d/c to home.  Will give limited quantity rx librium for home to help minimize withdrawal symptoms, and help facilitate patient abstinence and engagement in pursuit of sobriety and outpatient rehab.            Lajean Saver, MD 12/16/15 [phone]
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
## 2015-12-16 NOTE — ED Notes (Signed)
Pt. arrived with EMS from home reports seizures today x4 and intoxicated with alcohol , pt. stated she is [redacted] weeks pregnant ( G6P2) , pt. added low abdominal cramping . No emesis or diarrhea .

## 2015-12-16 NOTE — ED Notes (Signed)
RN advised pt. not to use her E- cigarette while in the hospital . EDP notified.

## 2015-12-16 NOTE — BHH Counselor (Signed)
Faxed OPT, Residential and IOP (SA) resources to nurse for pt to [phone].  Faylene Kurtz, MS, CRC, [place] Triage Specialist [facility]
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
## 2015-12-16 NOTE — Discharge Instructions (Signed)
It was our pleasure to provide your ER care today - we hope that you feel better.  Do not drink alcohol - it is very important for your health, and that of your baby, that you avoid drinking alcohol and smoking.  Smoking and drinking are very dangerous for your baby, and can cause permanent/irreversible harm.   You may take librium as need, as prescribed for withdrawal symptoms.   No driving for the next 6 hours, also make sure to never drive if/when drinking.  Also no driving when taking librium.   Use referrals and resource guide provided for treatment options.   Also follow up with AA and your counselor.   Follow up with primary care doctor in coming week.  Follow up with ob/gyn doctor in the coming week.  If any problems with pregnancy, go directly to [facility].   Return to ER if worse, new symptoms, vomiting, severe pain, seizures, other concern.     Alcohol Intoxication Alcohol intoxication occurs when the amount of alcohol that a person has consumed impairs his or her ability to mentally and physically function. Alcohol directly impairs the normal chemical activity of the brain. Drinking large amounts of alcohol can lead to changes in mental function and behavior, and it can cause many physical effects that can be harmful.  Alcohol intoxication can range in severity from mild to very severe. Various factors can affect the level of intoxication that occurs, such as the person's age, gender, weight, frequency of alcohol consumption, and the presence of other medical conditions (such as diabetes, seizures, or heart conditions). Dangerous levels of alcohol intoxication may occur when people drink large amounts of alcohol in a short period (binge drinking). Alcohol can also be especially dangerous when combined with certain prescription medicines or "recreational" drugs. SIGNS AND SYMPTOMS Some common signs and symptoms of mild alcohol intoxication include:  Loss of  coordination.  Changes in mood and behavior.  Impaired judgment.  Slurred speech. As alcohol intoxication progresses to more severe levels, other signs and symptoms will appear. These may include:  Vomiting.  Confusion and impaired memory.  Slowed breathing.  Seizures.  Loss of consciousness. DIAGNOSIS  Your health care provider will take a medical history and perform a physical exam. You will be asked about the amount and type of alcohol you have consumed. Blood tests will be done to measure the concentration of alcohol in your blood. In many places, your blood alcohol level must be lower than 80 mg/dL (0.08%) to legally drive. However, many dangerous effects of alcohol can occur at much lower levels.  TREATMENT  People with alcohol intoxication often do not require treatment. Most of the effects of alcohol intoxication are temporary, and they go away as the alcohol naturally leaves the body. Your health care provider will monitor your condition until you are stable enough to go home. Fluids are sometimes given through an IV access tube to help prevent dehydration.  HOME CARE INSTRUCTIONS  Do not drive after drinking alcohol.  Stay hydrated. Drink enough water and fluids to keep your urine clear or pale yellow. Avoid caffeine.   Only take over-the-counter or prescription medicines as directed by your health care provider.  SEEK MEDICAL CARE IF:   You have persistent vomiting.   You do not feel better after a few days.  You have frequent alcohol intoxication. Your health care provider can help determine if you should see a substance use treatment counselor. SEEK IMMEDIATE MEDICAL CARE IF:  You become shaky or tremble when you try to stop drinking.   You shake uncontrollably (seizure).   You throw up (vomit) blood. This may be bright red or may look like black coffee grounds.   You have blood in your stool. This may be bright red or may appear as a black, tarry, bad  smelling stool.   You become lightheaded or faint.  MAKE SURE YOU:   Understand these instructions.  Will watch your condition.  Will get help right away if you are not doing well or get worse.   This information is not intended to replace advice given to you by your health care provider. Make sure you discuss any questions you have with your health care provider.   Document Released: 05/23/2005 Document Revised: 04/15/2013 Document Reviewed: 01/16/2013 Elsevier Interactive Patient Education 2016 [employer].   Alcohol Use Disorder Alcohol use disorder is a mental disorder. It is not a one-time incident of heavy drinking. Alcohol use disorder is the excessive and uncontrollable use of alcohol over time that leads to problems with functioning in one or more areas of daily living. People with this disorder risk harming themselves and others when they drink to excess. Alcohol use disorder also can cause other mental disorders, such as mood and anxiety disorders, and serious physical problems. People with alcohol use disorder often misuse other drugs.  Alcohol use disorder is common and widespread. Some people with this disorder drink alcohol to cope with or escape from negative life events. Others drink to relieve chronic pain or symptoms of mental illness. People with a family history of alcohol use disorder are at higher risk of losing control and using alcohol to excess.  Drinking too much alcohol can cause injury, accidents, and health problems. One drink can be too much when you are:  Working.  Pregnant or breastfeeding.  Taking medicines. Ask your doctor.  Driving or planning to drive. SYMPTOMS  Signs and symptoms of alcohol use disorder may include the following:   Consumption ofalcohol inlarger amounts or over a longer period of time than intended.  Multiple unsuccessful attempts to cutdown or control alcohol use.   A great deal of time spent obtaining alcohol, using  alcohol, or recovering from the effects of alcohol (hangover).  A strong desire or urge to use alcohol (cravings).   Continued use of alcohol despite problems at work, school, or home because of alcohol use.   Continued use of alcohol despite problems in relationships because of alcohol use.  Continued use of alcohol in situations when it is physically hazardous, such as driving a car.  Continued use of alcohol despite awareness of a physical or psychological problem that is likely related to alcohol use. Physical problems related to alcohol use can involve the brain, heart, liver, stomach, and intestines. Psychological problems related to alcohol use include intoxication, depression, anxiety, psychosis, delirium, and dementia.   The need for increased amounts of alcohol to achieve the same desired effect, or a decreased effect from the consumption of the same amount of alcohol (tolerance).  Withdrawal symptoms upon reducing or stopping alcohol use, or alcohol use to reduce or avoid withdrawal symptoms. Withdrawal symptoms include:  Racing heart.  Hand tremor.  Difficulty sleeping.  Nausea.  Vomiting.  Hallucinations.  Restlessness.  Seizures. DIAGNOSIS Alcohol use disorder is diagnosed through an assessment by your health care provider. Your health care provider may start by asking three or four questions to screen for excessive or problematic alcohol use. To  confirm a diagnosis of alcohol use disorder, at least two symptoms must be present within a 64-monthperiod. The severity of alcohol use disorder depends on the number of symptoms:  Mild--two or three.  Moderate--four or five.  Severe--six or more. Your health care provider may perform a physical exam or use results from lab tests to see if you have physical problems resulting from alcohol use. Your health care provider may refer you to a mental health professional for evaluation. TREATMENT  Some people with alcohol  use disorder are able to reduce their alcohol use to low-risk levels. Some people with alcohol use disorder need to quit drinking alcohol. When necessary, mental health professionals with specialized training in substance use treatment can help. Your health care provider can help you decide how severe your alcohol use disorder is and what type of treatment you need. The following forms of treatment are available:   Detoxification. Detoxification involves the use of prescription medicines to prevent alcohol withdrawal symptoms in the first week after quitting. This is important for people with a history of symptoms of withdrawal and for heavy drinkers who are likely to have withdrawal symptoms. Alcohol withdrawal can be dangerous and, in severe cases, cause death. Detoxification is usually provided in a hospital or in-patient substance use treatment facility.  Counseling or talk therapy. Talk therapy is provided by substance use treatment counselors. It addresses the reasons people use alcohol and ways to keep them from drinking again. The goals of talk therapy are to help people with alcohol use disorder find healthy activities and ways to cope with life stress, to identify and avoid triggers for alcohol use, and to handle cravings, which can cause relapse.  Medicines.Different medicines can help treat alcohol use disorder through the following actions:  Decrease alcohol cravings.  Decrease the positive reward response felt from alcohol use.  Produce an uncomfortable physical reaction when alcohol is used (aversion therapy).  Support groups. Support groups are run by people who have quit drinking. They provide emotional support, advice, and guidance. These forms of treatment are often combined. Some people with alcohol use disorder benefit from intensive combination treatment provided by specialized substance use treatment centers. Both inpatient and outpatient treatment programs are available.    This information is not intended to replace advice given to you by your health care provider. Make sure you discuss any questions you have with your health care provider.   Document Released: 09/20/2004 Document Revised: 09/03/2014 Document Reviewed: 11/20/2012 Elsevier Interactive Patient Education 2016 [employer]    Smoking Hazards Smoking cigarettes is extremely bad for your health. Tobacco smoke has over 200 known poisons in it. It contains the poisonous gases nitrogen oxide and carbon monoxide. There are over 60 chemicals in tobacco smoke that cause cancer. Some of the chemicals found in cigarette smoke include:   Cyanide.   Benzene.   Formaldehyde.   Methanol (wood alcohol).   Acetylene (fuel used in welding torches).   Ammonia.  Even smoking lightly shortens your life expectancy by several years. You can greatly reduce the risk of medical problems for you and your family by stopping now. Smoking is the most preventable cause of death and disease in our society. Within days of quitting smoking, your circulation improves, you decrease the risk of having a heart attack, and your lung capacity improves. There may be some increased phlegm in the first few days after quitting, and it may take months for your lungs to clear up completely. Quitting for  10 years reduces your risk of developing lung cancer to almost that of a nonsmoker.  WHAT ARE THE RISKS OF SMOKING? Cigarette smokers have an increased risk of many serious medical problems, including:  Lung cancer.   Lung disease (such as pneumonia, bronchitis, and emphysema).   Heart attack and chest pain due to the heart not getting enough oxygen (angina).   Heart disease and peripheral blood vessel disease.   Hypertension.   Stroke.   Oral cancer (cancer of the lip, mouth, or voice box).   Bladder cancer.   Pancreatic cancer.   Cervical cancer.   Pregnancy complications, including premature birth.    Stillbirths and smaller newborn babies, birth defects, and genetic damage to sperm.   Early menopause.   Lower estrogen level for women.   Infertility.   Facial wrinkles.   Blindness.   Increased risk of broken bones (fractures).   Senile dementia.   Stomach ulcers and internal bleeding.   Delayed wound healing and increased risk of complications during surgery. Because of secondhand smoke exposure, children of smokers have an increased risk of the following:   Sudden infant death syndrome (SIDS).   Respiratory infections.   Lung cancer.   Heart disease.   Ear infections.  WHY IS SMOKING ADDICTIVE? Nicotine is the chemical agent in tobacco that is capable of causing addiction or dependence. When you smoke and inhale, nicotine is absorbed rapidly into the bloodstream through your lungs. Both inhaled and noninhaled nicotine may be addictive.  WHAT ARE THE BENEFITS OF QUITTING?  There are many health benefits to quitting smoking. Some are:   The likelihood of developing cancer and heart disease decreases. Health improvements are seen almost immediately.   Blood pressure, pulse rate, and breathing patterns start returning to normal soon after quitting.   People who quit may see an improvement in their overall quality of life.  HOW DO YOU QUIT SMOKING? Smoking is an addiction with both physical and psychological effects, and longtime habits can be hard to change. Your health care provider can recommend:  Programs and community resources, which may include group support, education, or therapy.  Replacement products, such as patches, gum, and nasal sprays. Use these products only as directed. Do not replace cigarette smoking with electronic cigarettes (commonly called e-cigarettes). The safety of e-cigarettes is unknown, and some may contain harmful chemicals. FOR MORE INFORMATION  American Lung Association: www.lung.org  American Cancer Society:  www.cancer.org   This information is not intended to replace advice given to you by your health care provider. Make sure you discuss any questions you have with your health care provider.   Document Released: 09/20/2004 Document Revised: 06/03/2013 Document Reviewed: 02/02/2013 Elsevier Interactive Patient Education 2016 [place] [facility]/Substance Abuse Adult The United Ways 211 is a great source of information about community services available.  Access by dialing 2-1-1 from anywhere in [place], or by website -  CustodianSupply.fi.   Other Local Resources (Updated 08/2015)  [facility]  Crisis Hotline, available 24 hours a day, 7 days a week: [place], [place]   [facility]  Crisis Hotline, available 24 hours a day, 7 days a week: [place], [place]  [facility]  Suicide Prevention Hotline, available 24 hours a day, 7 days a week: [phone] [facility] [place], available 24 hours a  day, 7 days a week: [place], [place] Access to [place], available 24 hours a day, 7 days a week: [phone] All   [facility]  Crisis Hotline, available 24 hours a day, 7 days a week: 612-344-5245 All   Other Local Resources (Updated 08/2015)  Outpatient Counseling/ Substance Abuse Programs  Services     Address and Phone Number  ADS (Alcohol and Drug Services)   Options include Individual counseling, group counseling, intensive outpatient program (several hours a day, several days a week)  Offers depression assessments  Provides methadone maintenance program [phone] [address] [place], [place] partial hospitalization/day treatment and DUI/DWI  programs  [employer], private insurance [phone] [address], [address] [place], [place] [address]  [place] include intensive outpatient program (several hours a day, several days a week), outpatient treatment, DUI/DWI services, family education  Also has some services specifically for [employer] transitional housing  [phone] [address] [place], [place] [address]     [place] Medicare, private pay, and private insurance [phone] [address] [place], [place] [address]  [facility]  Services include individual counseling, substance abuse intensive outpatient program (several hours a day, several days a week), day treatment  Blinda [place]atherwood, Medicaid, private insurance [phone] [address], [place], [place] [address]  [facility]   Offers substance abuse intensive outpatient program (several hours a day, several days a week), partial hospitalization program [phone] [address] [place], [place] [address]  [phone] [address] [place], [place] [address]  [phone] [place], [place] [address]  [phone], [place], [place] [address]  [facility]  Individual counseling only  Accepts private insurance only [phone] [address], [place], [place] [address]  [facility]: Methadone Clinic  Methadone maintenance program [phone] [address] [place], [place] [address]  [facility] providing substance abuse and mental health counseling  Accepts Medicaid, Medicare, private insurance  Offers sliding scale for uninsured [phone] [place], [place] in [place] individual counseling, and intensive in-home services [phone] [address], [place], [place] [address]  [facility] [place] individual counseling, family counseling, group therapy, domestic violence counseling, consumer credit counseling  Accepts Medicare, Medicaid, private insurance  Offers sliding scale for uninsured [phone] [address] [place] [place], [place] [address]  [phone] [facility], [address] [place], [place]  [facility]  Offers individual, family and group counseling  3 locations - [place], [place], and [place]  [address] [address] [place], [place] [address]  [address] [place], [place] [address]  [place], [place] [address]  [facility] [place] [facility]    Offers psychiatric assessment, 8-week Intensive Outpatient Program (several hours a day, several times a week, daytime or evenings), early recovery group, family Program, medication management  Private pay or private insurance only [phone], or  [phone] [address] [place], [place] [address]  [facility]  Offers individual, couples and family counseling  Accepts Medicaid, private insurance, and sliding scale for uninsured [phone] [address] [place], [place] [address]  Launa Flight, MD  Individual counseling  Private insurance [phone] [address]  [place], [place] [address]  [facility]   Offers assessment, substance abuse treatment, and behavioral health treatment [phone] [address] [place], [place] [address]  [place]  Individual counseling  Accepts private insurance [phone] [place], [place] [address]  [facility]  Individual counseling  Blinda Leatherwood, private insurance [phone] [place], [place] [address]  [place]    Offers intensive outpatient program (several hours a day, several times a week)  Private pay, private  insurance [phone] [place], [place]  Individual counseling  Medicare, private insurance [phone] [address], [place], [place] [address]  [place]    Offers intensive outpatient program (several hours a day, several times a week) and partial hospitalization program [phone] [place], [place] [address]  Letta Moynahan, MD  Individual counseling [phone] [address] [place], [place] [address]  [place] counseling to individuals, couples, and families  Accepts Medicare and private insurance; offers sliding scale for uninsured [phone] [place], [place] [address]  [facility]  Christian counseling [phone] [address] [place], [place] [address]  [facility] [employer] crisis counseling, individual counseling, group therapy, in-home therapy, domestic violence services, day treatment, DWI services, Conservation officer, nature (CST), Assertive Community Treatment Team (ACTT), substance abuse Intensive Outpatient Program (several hours a day, several times a week)  2 locations - [place] and [place], [place] [address]  [phone] [address] [place], [place] [address]  [place] counseling and group therapy  [place] insurance, [place], [place] [phone] [address] [place], [place]  [facility]  Offers individual and family counseling  Offers LGBTQ services  Accepts private insurance and private pay [phone] [place], [place] [address]  [facility]    Offers individual counseling, group therapy, and outpatient detox  Accepts private insurance [phone] [place], [place] Medicare, private insurance [phone] [address] [place], [place] [address]  [employer]  Individual counseling  Accepts Medicare, private insurance [phone] [address] [place], [place] [address]  [place] substance abuse Intensive Outpatient Program (several hours a day, several times a week) [phone], or [phone] [place], [place] [facility]/Residential  Substance Abuse Treatment Adults The [employer] Ways 211 is a great source of information about community services available.  Access by dialing 2-1-1 from anywhere in [place], or by website -  CustodianSupply.fi.   (Updated 08/2015)  Crisis Assistance 24 hours a day   [facility]  24-hour crisis assistance: [place], [place]   [facility]  24-hour crisis [place], [place]   24-hour crisis assistance: [place], [place] Access to Care Line  24-hour crisis assistance; [phone] All   [facility]  24-hour crisis response line: [phone] All   Other Local Resources (Updated 08/2015)  Inpatient Behavioral Health/Residential Substance Abuse Treatment Programs   Services      Address and Phone Number  [facility] ([place])   14-day residential  rehabilitation  [phone] [place], [place] ([place])    Detox - private pay only  14-day residential rehabilitation -  Medicaid, insurance, private pay only [phone], or [place], [place], [place] [address]   [place] only  Multiple facilities [phone] admissions   [facility]  ([place])   90-day program  Must be homeless to participate  [phone], or [place], [place] only [phone], or  [place], [place] [address]  [place]     Must make an appointment  Transportation is offered from [place] on [place].  Accepts private pay, [facility] [phone]  [address] [place], [place] [address]   [employer]  Females only  Associated with the [place] [phone] [place], [place] [address]  [facility]   Private insurance only [phone], or [phone] [address] [place], [place]  [place]    Detox  Residential rehabilitation  Private insurance only  Multiple locations [phone] admissions  [place] pay  Private insurance [phone] [address] [place] [address]  [facility]    Males only  Fee required at time of admission [place], [place] [address]  [place] pay only  [phone] [address] [place], [place]  RTS (Residential Treatment Services)    Detox - private pay, Medicaid  Residential rehabilitation for males  - Medicare, Medicaid, insurance, private pay [phone] [place], [place] interviews Monday - Saturday from 8 am - 4 pm  Individuals with legal charges are not eligible [phone] [address] [place], [place] [address]  [facility]   Must be willing to work  Must attend Alcoholics Anonymous meetings [phone] [address] [place], [place]    Faith-based program  Private pay only [phone] [place], [place]

## 2015-12-16 NOTE — ED Notes (Signed)
Patient transported to Ultrasound 

## 2015-12-20 ENCOUNTER — Emergency Department: Payer: Medicaid Other

## 2015-12-20 ENCOUNTER — Encounter: Payer: Self-pay | Admitting: Emergency Medicine

## 2015-12-20 ENCOUNTER — Inpatient Hospital Stay
Admission: EM | Admit: 2015-12-20 | Discharge: 2015-12-23 | DRG: 781 | Disposition: A | Discharge status: Home / Self Care | Payer: Medicaid Other | Attending: Specialist | Admitting: Specialist

## 2015-12-20 DIAGNOSIS — Z79899 Other long term (current) drug therapy: Secondary | ICD-10-CM | POA: Diagnosis not present

## 2015-12-20 DIAGNOSIS — F17210 Nicotine dependence, cigarettes, uncomplicated: Secondary | ICD-10-CM | POA: Diagnosis present

## 2015-12-20 DIAGNOSIS — O99311 Alcohol use complicating pregnancy, first trimester: Principal | ICD-10-CM | POA: Diagnosis present

## 2015-12-20 DIAGNOSIS — R6510 Systemic inflammatory response syndrome (SIRS) of non-infectious origin without acute organ dysfunction: Secondary | ICD-10-CM | POA: Diagnosis present

## 2015-12-20 DIAGNOSIS — O99331 Smoking (tobacco) complicating pregnancy, first trimester: Secondary | ICD-10-CM | POA: Diagnosis present

## 2015-12-20 DIAGNOSIS — F329 Major depressive disorder, single episode, unspecified: Secondary | ICD-10-CM | POA: Diagnosis present

## 2015-12-20 DIAGNOSIS — Z882 Allergy status to sulfonamides status: Secondary | ICD-10-CM | POA: Diagnosis not present

## 2015-12-20 DIAGNOSIS — O99281 Endocrine, nutritional and metabolic diseases complicating pregnancy, first trimester: Secondary | ICD-10-CM | POA: Diagnosis present

## 2015-12-20 DIAGNOSIS — E871 Hypo-osmolality and hyponatremia: Secondary | ICD-10-CM | POA: Diagnosis present

## 2015-12-20 DIAGNOSIS — R569 Unspecified convulsions: Secondary | ICD-10-CM | POA: Diagnosis present

## 2015-12-20 DIAGNOSIS — Z809 Family history of malignant neoplasm, unspecified: Secondary | ICD-10-CM

## 2015-12-20 DIAGNOSIS — Z885 Allergy status to narcotic agent status: Secondary | ICD-10-CM

## 2015-12-20 DIAGNOSIS — Z823 Family history of stroke: Secondary | ICD-10-CM

## 2015-12-20 DIAGNOSIS — F1020 Alcohol dependence, uncomplicated: Secondary | ICD-10-CM | POA: Diagnosis not present

## 2015-12-20 DIAGNOSIS — F418 Other specified anxiety disorders: Secondary | ICD-10-CM | POA: Diagnosis present

## 2015-12-20 DIAGNOSIS — Z331 Pregnant state, incidental: Secondary | ICD-10-CM | POA: Diagnosis not present

## 2015-12-20 DIAGNOSIS — Z888 Allergy status to other drugs, medicaments and biological substances status: Secondary | ICD-10-CM

## 2015-12-20 DIAGNOSIS — F10239 Alcohol dependence with withdrawal, unspecified: Secondary | ICD-10-CM | POA: Diagnosis present

## 2015-12-20 DIAGNOSIS — Z91040 Latex allergy status: Secondary | ICD-10-CM | POA: Diagnosis not present

## 2015-12-20 DIAGNOSIS — O99341 Other mental disorders complicating pregnancy, first trimester: Secondary | ICD-10-CM | POA: Diagnosis present

## 2015-12-20 DIAGNOSIS — N939 Abnormal uterine and vaginal bleeding, unspecified: Secondary | ICD-10-CM | POA: Diagnosis present

## 2015-12-20 DIAGNOSIS — O0992 Supervision of high risk pregnancy, unspecified, second trimester: Secondary | ICD-10-CM

## 2015-12-20 DIAGNOSIS — Z3A09 9 weeks gestation of pregnancy: Secondary | ICD-10-CM | POA: Diagnosis not present

## 2015-12-20 DIAGNOSIS — F10939 Alcohol use, unspecified with withdrawal, unspecified: Secondary | ICD-10-CM

## 2015-12-20 DIAGNOSIS — Z720 Tobacco use: Secondary | ICD-10-CM | POA: Diagnosis present

## 2015-12-20 DIAGNOSIS — O209 Hemorrhage in early pregnancy, unspecified: Secondary | ICD-10-CM | POA: Diagnosis present

## 2015-12-20 DIAGNOSIS — Z886 Allergy status to analgesic agent status: Secondary | ICD-10-CM | POA: Diagnosis not present

## 2015-12-20 LAB — CBC
HEMATOCRIT: 46 % (ref 35.0–47.0)
Hemoglobin: 15.4 g/dL (ref 12.0–16.0)
MCH: 30.5 pg (ref 26.0–34.0)
MCHC: 33.4 g/dL (ref 32.0–36.0)
MCV: 91.3 fL (ref 80.0–100.0)
Platelets: 290 10*3/uL (ref 150–440)
RBC: 5.04 MIL/uL (ref 3.80–5.20)
RDW: 16.6 % — AB (ref 11.5–14.5)
WBC: 12.7 10*3/uL — ABNORMAL HIGH (ref 3.6–11.0)

## 2015-12-20 LAB — BASIC METABOLIC PANEL
Anion gap: 11 (ref 5–15)
BUN: 17 mg/dL (ref 6–20)
CHLORIDE: 100 mmol/L — AB (ref 101–111)
CO2: 21 mmol/L — ABNORMAL LOW (ref 22–32)
CREATININE: 0.51 mg/dL (ref 0.44–1.00)
Calcium: 8.7 mg/dL — ABNORMAL LOW (ref 8.9–10.3)
GFR calc Af Amer: >60 mL/min (ref >60)
GFR calc non Af Amer: >60 mL/min (ref >60)
GLUCOSE: 124 mg/dL — AB (ref 65–99)
POTASSIUM: 3.9 mmol/L (ref 3.5–5.1)
SODIUM: 132 mmol/L — AB (ref 135–145)

## 2015-12-20 LAB — WET PREP, GENITAL
Clue Cells Wet Prep HPF POC: NONE SEEN
Sperm: NONE SEEN
TRICH WET PREP: NONE SEEN
YEAST WET PREP: NONE SEEN

## 2015-12-20 LAB — URINE DRUG SCREEN, QUALITATIVE (ARMC ONLY)
AMPHETAMINES, UR SCREEN: NOT DETECTED
Barbiturates, Ur Screen: NOT DETECTED
Benzodiazepine, Ur Scrn: POSITIVE — AB
CANNABINOID 50 NG, UR ~~LOC~~: NOT DETECTED
COCAINE METABOLITE, UR ~~LOC~~: NOT DETECTED
MDMA (ECSTASY) UR SCREEN: NOT DETECTED
Methadone Scn, Ur: NOT DETECTED
OPIATE, UR SCREEN: NOT DETECTED
PHENCYCLIDINE (PCP) UR S: NOT DETECTED
Tricyclic, Ur Screen: NOT DETECTED

## 2015-12-20 LAB — CHLAMYDIA/NGC RT PCR (ARMC ONLY)
CHLAMYDIA TR: NOT DETECTED
N GONORRHOEAE: NOT DETECTED

## 2015-12-20 LAB — URINALYSIS COMPLETE WITH MICROSCOPIC (ARMC ONLY)
BACTERIA UA: NONE SEEN
BILIRUBIN URINE: NEGATIVE
Glucose, UA: 50 mg/dL — AB
HGB URINE DIPSTICK: NEGATIVE
Leukocytes, UA: NEGATIVE
Nitrite: NEGATIVE
PH: 5 (ref 5.0–8.0)
Protein, ur: 30 mg/dL — AB
SPECIFIC GRAVITY, URINE: 1.03 (ref 1.005–1.030)
WBC UA: NONE SEEN WBC/hpf (ref 0–5)

## 2015-12-20 LAB — HEPATIC FUNCTION PANEL
ALT: 425 U/L — ABNORMAL HIGH (ref 14–54)
AST: 795 U/L — AB (ref 15–41)
Albumin: 3.6 g/dL (ref 3.5–5.0)
Alkaline Phosphatase: 177 U/L — ABNORMAL HIGH (ref 38–126)
Bilirubin, Direct: 0.4 mg/dL (ref 0.1–0.5)
Indirect Bilirubin: 0.5 mg/dL (ref 0.3–0.9)
Total Bilirubin: 0.9 mg/dL (ref 0.3–1.2)
Total Protein: 7.6 g/dL (ref 6.5–8.1)

## 2015-12-20 LAB — LIPASE, BLOOD: LIPASE: 13 U/L (ref 11–51)

## 2015-12-20 LAB — ABO/RH: ABO/RH(D): O POS

## 2015-12-20 LAB — HCG, QUANTITATIVE, PREGNANCY: HCG, BETA CHAIN, QUANT, S: 185540 m[IU]/mL — AB (ref <5)

## 2015-12-20 LAB — ETHANOL: ALCOHOL ETHYL (B): 209 mg/dL — AB (ref <5)

## 2015-12-20 MED ORDER — LORAZEPAM 2 MG/ML IJ SOLN
0.0000 mg | Freq: Two times a day (BID) | INTRAMUSCULAR | Status: DC
Start: 2015-12-22 — End: 2015-12-23
  Administered 2015-12-22: 1 mg via INTRAVENOUS
  Filled 2015-12-20: qty 1

## 2015-12-20 MED ORDER — LORAZEPAM 2 MG/ML IJ SOLN
0.0000 mg | Freq: Four times a day (QID) | INTRAMUSCULAR | Status: AC
  Administered 2015-12-20 (×2): 2 mg via INTRAVENOUS
  Administered 2015-12-21 – 2015-12-22 (×2): 1 mg via INTRAVENOUS
  Administered 2015-12-22: 2 mg via INTRAVENOUS
  Filled 2015-12-20: qty 1
  Filled 2015-12-20: qty 2
  Filled 2015-12-20 (×2): qty 1

## 2015-12-20 MED ORDER — SODIUM CHLORIDE 0.9 % IV BOLUS (SEPSIS)
1000.0000 mL | Freq: Once | INTRAVENOUS | Status: AC
  Administered 2015-12-20: 1000 mL via INTRAVENOUS

## 2015-12-20 NOTE — H&P (Signed)
[facility] [place] NAME: Airyana Sprunger    MR#:  [id number]  DATE OF BIRTH:  04/25/1986  DATE OF ADMISSION:  12/20/2015  PRIMARY CARE PHYSICIAN: Leonides Sake, MD   REQUESTING/REFERRING PHYSICIAN: Dr Dineen Kid  CHIEF COMPLAINT:   Chief Complaint  Patient presents with  . Vaginal Bleeding    HISTORY OF PRESENT ILLNESS: Berkleigh Beckles  is a 30 y.o. female with a known history of Alcohol abuse, alcoholic hepatitis, benzodiazepine abuse, alcohol withdrawal seizures, failure to thrive, generalized anxiety disorder, thrombocytopenia and multiple other illnesses related to alcohol abuse. She presents to the ED today due to an alcohol withdrawal seizure that occurred early in the day. Patient states that she has been trying to cut down on her alcohol and drank only one cannot hear today. She typically drinks high alcohol content drinks. She states that she had a seizure about 3 PM today and seizure was generalized. It was similar to prior seizures from alcohol withdrawal and he was not weakness. She states that she lost consciousness for an estimated 30 seconds and denies any tongue biting. She however reports further discharge from mouth as well as generalized weakness postseizure. She however denies any focal neurological deficits. She denies urine or bowel incontinence. She has nausea and vomited 3 times today. She also reports dark vaginal bleed today. Patient is [redacted] weeks pregnant. She denies any recreational drug use but continues to smoke half a pack of cigarettes per day. She does not use any other drugs. She denies urinary symptoms.  On arrival in the ED, patient was tachycardic up to 1:30 and CBC was significant for a white count of 12.7. CMP was also significant for a sodium of 132. Urine drug screen was positive for benzos and pregnancy test was positive. Alcohol level was 209 (it was 388 recently). Obstetric ultrasound was negative for  any acute source of bleeding and EKG showed sinus tachycardia. Patient was started on CIWA protocol in the ED with lorazepam. Patient will be admitted to the stepdown unit due to alcohol withdrawal seizures/alcohol abuse. She is full code.  PAST MEDICAL HISTORY:   Past Medical History  Diagnosis Date  . Depression with anxiety initally at age 89  . Chlamydia 2007    bacterial vaginosis 09/2011  . Irregular heart beat 2010    wt/diet related after evaluation  . Renal disorder   . Pneumothorax, spontaneous, tension   . Alcohol abuse 11/2014    drinking since age 64. chronic, recurrent. at least 2 detox admits before 2015.   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   . Failure to thrive in adult 12/2014.     Malnutrition: n/v, not eating, weight loss, BMI 14.  s/p 01/11/2015 PEG (Dr Dorna Leitz).   . Alcoholic hepatitis 03/5461    hepatic steatosis on 11/2014 ultrasound.   . Seizure ([place]) 06/2015    due to ETOH/benzo withdrawal.   . Thrombocytopenia ([place]) 04/2007  . Colitis 12/2014    colonoscopy for diarrhea and abnml CT 01/06/15: erythmatous TI (path: active ileitis, ? emerging IBD), rectal erythema (path: mucosal prolapse). Random bx of normal colon (path unremarkable)   . Spontaneous pneumothorax 08/2012    right.  chest tube placed.     PAST SURGICAL HISTORY:  Past Surgical History  Procedure Laterality Date  . Cesarean section  04/2007, 07/2009     x 2.  G3, Para 2012 as of 07/2009.   Marland Kitchen  Chest tube insertion    . Esophagogastroduodenoscopy N/A 01/06/2015    ;[facility], Dr Rayann Heman. For wt loss, N/V: Normal study, duodenal biopsy/pathology: chronic active duodenitis.   . Colonoscopy with propofol N/A 01/06/2015    [facility], Dr Rayann Heman. colonoscopy for diarrhea and abnml CT 01/06/15: erythmatous TI (path: active ileitis, ? emerging IBD), rectal erythema (path: mucosal prolapse). Random bx of normal colon (path unremarkable)   . Peg placement N/A 01/11/2015    [facility], Dr Rayann Heman.  for N/V/wt loss/severe  malnutrition.   . Esophagogastroduodenoscopy N/A 01/11/2015    Rein-normal with PEG placement    SOCIAL HISTORY:  Social History  Substance Use Topics  . Smoking status: Current Every Day Smoker -- 2.00 packs/day    Types: Cigarettes  . Smokeless tobacco: Never Used  . Alcohol Use: 21.6 oz/week    36 Cans of beer per week     Comment: 6 beers/day; trying to quit, participating in [place]:  Family History  Problem Relation Age of Onset  . Anesthesia problems Neg Hx   . Thyroid disease Mother   . Cancer Father   . Stroke Other   . Crohn's disease Brother     DRUG ALLERGIES:  Allergies  Allergen Reactions  . Aspirin Shortness Of Breath  . Effexor [Venlafaxine] Anaphylaxis  . Sulfa Antibiotics Anaphylaxis  . Tetracyclines & Related Anaphylaxis  . Trazodone And Nefazodone Anaphylaxis  . Latex Hives and Itching  . Paxil [Paroxetine] Hives and Itching  . Seroquel [Quetiapine Fumarate] Hives and Itching  . Zoloft [Sertraline Hcl] Hives and Itching    REVIEW OF SYSTEMS:   CONSTITUTIONAL: No fever, + fatigue or weakness.  EYES: No blurred or double vision.  EARS, NOSE, AND THROAT: No tinnitus or ear pain.  RESPIRATORY: No cough, shortness of breath, wheezing or hemoptysis.  CARDIOVASCULAR: No chest pain, orthopnea, edema.  GASTROINTESTINAL: As in history of present illness GENITOURINARY: No dysuria, hematuria.  ENDOCRINE: No polyuria, nocturia,  HEMATOLOGY: No anemia, easy bruising or bleeding SKIN: No rash or lesion. MUSCULOSKELETAL: No joint pain or arthritis.   NEUROLOGIC: No tingling, numbness, weakness.  PSYCHIATRY: No anxiety or depression.   MEDICATIONS AT HOME:  Prior to Admission medications   Medication Sig Start Date End Date Taking? Authorizing Provider  clonazePAM (KLONOPIN) 0.25 MG disintegrating tablet Take 0.25 mg by mouth 2 (two) times daily.   Yes Historical Provider, MD  ondansetron (ZOFRAN) 4 MG tablet Take 1 tablet (4 mg total) by  mouth every 6 (six) hours as needed for nausea. 12/10/15  Yes Lytle Butte, MD  Prenatal Vit-Fe Fumarate-FA (PRENATAL MULTIVITAMIN) TABS tablet Take 1 tablet by mouth daily.   Yes Historical Provider, MD  chlordiazePOXIDE (LIBRIUM) 25 MG capsule 25 mg PO TID x 2 days, then 25 mg PO BID X  2 days, then 25 mg PO QD X 2 days Patient not taking: Reported on 12/20/2015 12/16/15   Lajean Saver, MD      PHYSICAL EXAMINATION:   VITAL SIGNS: Blood pressure 120/98, pulse 120, temperature 98.9 F (37.2 C), temperature source Oral, resp. rate 17, height 5' 6"  (1.676 m), weight 50.803 kg (112 lb), last menstrual period 07/28/2015, SpO2 97 %.  GENERAL: Thin  30 y.o.-year-old patient lying in the bed with no acute distress. Appears lethargic. Alert and oriented x 3. EYES: Pupils equal, round, reactive to light and accommodation. No scleral icterus. Extraocular muscles intact.  HEENT: Head atraumatic, normocephalic. Oropharynx and nasopharynx clear.  NECK:  Supple, no  jugular venous distention. No thyroid enlargement, no tenderness.  LUNGS: Normal breath sounds bilaterally, no wheezing, rales,rhonchi or crepitation. No use of accessory muscles of respiration.  CARDIOVASCULAR: S1, S2 normal. No murmurs, rubs, or gallops.  ABDOMEN: Soft, nontender, nondistended. Bowel sounds present. No organomegaly or mass.  EXTREMITIES: No pedal edema, cyanosis, or clubbing.  NEUROLOGIC: Cranial nerves II through XII are intact. Muscle strength 5/5 in all extremities. Sensation intact. Gait not checked.   SKIN: No obvious rash, lesion, or ulcer.   LABORATORY PANEL:   CBC  Recent Labs Lab 12/16/15 0059 12/20/15 1925  WBC 7.6 12.7*  HGB 12.6 15.4  HCT 38.0 46.0  PLT 275 290  MCV 92.9 91.3  MCH 30.8 30.5  MCHC 33.2 33.4  RDW 15.1 16.6*  LYMPHSABS 2.9  --   MONOABS 0.5  --   EOSABS 0.1  --   BASOSABS 0.0  --     ------------------------------------------------------------------------------------------------------------------  Chemistries   Recent Labs Lab 12/16/15 0059 12/20/15 1925  NA 142 132*  K 3.5 3.9  CL 106 100*  CO2 24 21*  GLUCOSE 98 124*  BUN 5* 17  CREATININE 0.37* 0.51  CALCIUM 8.3* 8.7*  AST 114* 795*  ALT 65* 425*  ALKPHOS 90 177*  BILITOT <0.1* 0.9   ------------------------------------------------------------------------------------------------------------------ estimated creatinine clearance is 82.5 mL/min (by C-G formula based on Cr of 0.51). ------------------------------------------------------------------------------------------------------------------ No results for input(s): TSH, T4TOTAL, T3FREE, THYROIDAB in the last 72 hours.  Invalid input(s): FREET3   Coagulation profile No results for input(s): INR, PROTIME in the last 168 hours. ------------------------------------------------------------------------------------------------------------------- No results for input(s): DDIMER in the last 72 hours. -------------------------------------------------------------------------------------------------------------------  Cardiac Enzymes No results for input(s): CKMB, TROPONINI, MYOGLOBIN in the last 168 hours.  Invalid input(s): CK ------------------------------------------------------------------------------------------------------------------ Invalid input(s): POCBNP  ---------------------------------------------------------------------------------------------------------------  Urinalysis    Component Value Date/Time   COLORURINE AMBER* 12/20/2015 1958   COLORURINE Yellow 12/20/2014 1018   APPEARANCEUR CLEAR* 12/20/2015 1958   APPEARANCEUR Clear 12/20/2014 1018   LABSPEC 1.030 12/20/2015 1958   LABSPEC 1.006 12/20/2014 1018   PHURINE 5.0 12/20/2015 1958   PHURINE 6.0 12/20/2014 1018   GLUCOSEU 50* 12/20/2015 1958   GLUCOSEU Negative 12/20/2014  1018   HGBUR NEGATIVE 12/20/2015 1958   HGBUR 1+ 12/20/2014 1018   BILIRUBINUR NEGATIVE 12/20/2015 1958   BILIRUBINUR Negative 12/20/2014 1018   KETONESUR TRACE* 12/20/2015 1958   KETONESUR Negative 12/20/2014 1018   PROTEINUR 30* 12/20/2015 1958   PROTEINUR Negative 12/20/2014 1018   UROBILINOGEN 0.2 06/13/2012 2306   NITRITE NEGATIVE 12/20/2015 1958   NITRITE Negative 12/20/2014 1018   LEUKOCYTESUR NEGATIVE 12/20/2015 1958   LEUKOCYTESUR Negative 12/20/2014 1018     RADIOLOGY: US Ob Comp Less 14 Wks  12/20/2015  CLINICAL DATA:  Acute onset of vaginal bleeding.  Initial encounter. EXAM: OBSTETRIC <14 WK [place] AND TRANSVAGINAL OB US TECHNIQUE: Both transabdominal and transvaginal ultrasound examinations were performed for complete evaluation of the gestation as well as the maternal uterus, adnexal regions, and pelvic cul-de-sac. Transvaginal technique was performed to assess early pregnancy. COMPARISON:  Pelvic ultrasound performed 12/16/2015, and CT of the abdomen and pelvis performed 08/08/2015 FINDINGS: Intrauterine gestational sac: Visualized; normal in shape. Yolk sac:  Yes Embryo:  Yes Cardiac Activity: Yes Heart Rate: 180  bpm CRL:  2.27 cm   9 w   0 d                  [place] EDC: 07/24/2016 Subchorionic hemorrhage:  None visualized. Maternal uterus/adnexae: The uterus  is otherwise unremarkable in appearance. The right ovary is within normal limits. It measures 2.2 x 1.2 x 1.6 cm. The left ovary is not visualized due to overlying bowel gas. No suspicious adnexal masses are seen. No free fluid is seen within the pelvic cul-de-sac. IMPRESSION: Single live intrauterine pregnancy noted, with a crown-rump length of 2.3 cm, corresponding to a gestational age of [redacted] weeks 0 days. This matches the gestational age of [redacted] weeks 2 days by the first ultrasound, reflecting an estimated date of delivery of July 22, 2016. Electronically Signed   By: Garald Balding M.D.   On: 12/20/2015 22:28   US Ob  Transvaginal  12/20/2015  CLINICAL DATA:  Acute onset of vaginal bleeding.  Initial encounter. EXAM: OBSTETRIC <14 WK [place] AND TRANSVAGINAL OB US TECHNIQUE: Both transabdominal and transvaginal ultrasound examinations were performed for complete evaluation of the gestation as well as the maternal uterus, adnexal regions, and pelvic cul-de-sac. Transvaginal technique was performed to assess early pregnancy. COMPARISON:  Pelvic ultrasound performed 12/16/2015, and CT of the abdomen and pelvis performed 08/08/2015 FINDINGS: Intrauterine gestational sac: Visualized; normal in shape. Yolk sac:  Yes Embryo:  Yes Cardiac Activity: Yes Heart Rate: 180  bpm CRL:  2.27 cm   9 w   0 d                  [place] EDC: 07/24/2016 Subchorionic hemorrhage:  None visualized. Maternal uterus/adnexae: The uterus is otherwise unremarkable in appearance. The right ovary is within normal limits. It measures 2.2 x 1.2 x 1.6 cm. The left ovary is not visualized due to overlying bowel gas. No suspicious adnexal masses are seen. No free fluid is seen within the pelvic cul-de-sac. IMPRESSION: Single live intrauterine pregnancy noted, with a crown-rump length of 2.3 cm, corresponding to a gestational age of [redacted] weeks 0 days. This matches the gestational age of [redacted] weeks 2 days by the first ultrasound, reflecting an estimated date of delivery of July 22, 2016. Electronically Signed   By: Garald Balding M.D.   On: 12/20/2015 22:28    EKG: Orders placed or performed during the hospital encounter of 12/20/15  . ED EKG  . ED EKG  . EKG 12-Lead  . EKG 12-Lead    ASSESSMENT  Principal Problem:   Alcohol withdrawal seizure ([place]) Active Problems:   Alcohol use disorder, severe, dependence (HCC)   Hyponatremia   Pregnancy   SIRS    Tobacco abuse   Vaginal bleeding  PLAN   1). Alcohol withdrawal seizures/alcohol abuse/alcohol intoxication - Patient has a history of alcohol abuse as well as alcohol withdrawal seizures and presents today  due to another episode of alcohol withdrawal seizure. She has been trying to cut down on alcohol content of her drinks due to her pregnancy state. - She has not had a recurrence of seizure after the first episode today. - Patient has been started on CIWA protocol with lorazepam and continue banana bag. - Psychiatry consult for detoxification - I have extensively counseled patient on the need to stop alcohol use especially with her pregnancy state. - Continue to monitor patient closely. - Ensure seizure precautions.  2). Pregnancy/acute vaginal bleed - Patient reports an episode of vaginal bleeding today. OB/GYN has been consulted - Obstetric ultrasound obtained was negative for any source of bleeding. - Continue to monitor patient for any further bleeds.  3). SIRS - Patient meets the SIRS criteria but there is no acute source of infection. Elevated white count  and tachycardia is most likely related to alcohol withdrawal and seizure. - Continue IV fluids and monitor vital signs closely. Repeat CBC in the morning.  4). Hyponatremia - Patient has a hyponatremia 132. Continue IV fluids with banana bag. - Monitor sodium levels.  5). Tobacco abuse - Patient continues to smoke half a pack of cigars per day despite knowing the deleterious effect of smoking and pregnancy. I extensively counseled on the need to quit smoking. Counseling lasted about 4 minutes.  All the records are reviewed and case discussed with ED provider. Management plans discussed with the patient, family and they are in agreement.  CODE STATUS:    Code Status Orders        Start     Ordered   12/20/15 1940  Full code   Continuous     12/20/15 1940    Code Status History    Date Active Date Inactive Code Status Order ID Comments User Context   12/16/2015  1:42 AM 12/16/2015  5:10 PM Full Code [id number]  Merryl Hacker, MD ED   12/08/2015 12:39 AM 12/10/2015  5:18 PM Full Code [id number]  Fritzi Mandes, MD Inpatient    10/14/2015  8:31 PM 10/22/2015  8:29 PM Full Code [id number]  Vaughan Basta, MD Inpatient   10/05/2015  3:34 AM 10/05/2015  5:53 PM Full Code [id number]  Lance Coon, MD ED   08/08/2015  5:52 PM 08/10/2015  8:21 PM Full Code [id number]  Elmarie Shiley, MD Inpatient   07/09/2015  6:11 PM 07/14/2015  5:56 PM Full Code [id number]  Theodis Blaze, MD Inpatient   07/01/2015 12:32 AM 07/02/2015  5:52 PM Full Code [id number]  Lytle Butte, MD ED   06/21/2015 12:25 PM 06/22/2015  4:08 PM Full Code [id number]  Epifanio Lesches, MD ED   05/20/2015  7:56 PM 05/23/2015  3:55 PM Full Code [id number]  Fritzi Mandes, MD Inpatient   04/16/2015  9:21 PM 04/18/2015  4:55 PM Full Code [id number]  Idelle Crouch, MD Inpatient   03/01/2015  8:52 PM 03/03/2015  4:05 PM Full Code [id number]  Lytle Butte, MD ED   01/04/2015  7:23 PM 01/13/2015  7:12 PM Full Code [id number]  Fritzi Mandes, MD ED   11/24/2014  6:21 PM 11/26/2014  1:29 PM Full Code [id number]  Serita Grit, MD ED      I have independently reviewed all EKG and chest x-ray data  VTE prophylaxis: Lovenox if no contraindications and patient at low risk for bleeding. SCD's and progressive ambulation if patient has contraindications to anticoagulants. No DVT prophylaxis if patient presently receiving therapeutic anticoagulation or is at significant risk of bleeding for which the risk of anticoagulation outweigh the potential benefits.  Vaccinations: Pneumonia & flu vaccine per hospital protocol Prevention: Will proceed with conservative measures for the prevention of delirium in patients older than 65. Fall precautions and 1:1 sitter as needed per hospital protocol.  TOTAL TIME TAKING CARE OF THIS PATIENT: 50 minutes.    Sylvan Cheese M.D on 12/20/2015 at 10:56 PM  Between 7am to 6pm - Pager - [phone]  After 6pm go to [email or web address] - password [email or web address] [facility]  [facility]  Office  [phone]  CC: Primary care physician; Leonides Sake, MD

## 2015-12-20 NOTE — ED Notes (Signed)
Pt arrived by EMS with c/o vaginal bleeding and post seizure x1 today. Pt states she is [redacted] weeks pregnant, has a hx of ETOH use. Pt states she stopped drinking this morning. Pt has hx of seizures, states she takes her medication on a regular basis. Pt is tachycardic on arrival at 128bpm. Pt denies drug use.

## 2015-12-20 NOTE — ED Provider Notes (Signed)
[facility] Emergency Department Provider Note  ____________________________________________  Time seen: Approximately 8:17 PM  I have reviewed the triage vital signs and the nursing notes.   HISTORY  Chief Complaint Vaginal Bleeding    HPI Kristine Macias is a 30 y.o. female who is G6 P2 at [redacted] weeks pregnant who is complaining today of a seizure and vaginal bleeding. She says that she struggles with alcoholismand drinks about 36 ounces of a percent alcohol per day. However, she says that she has only had 2 drinks over the course of the last 2 days. She said that she had generalized seizure today for about 30 seconds. She denies losing bowel or bladder continence. She says that she also feels shaky and thinks she is going through withdrawal. She also reports dark vaginal bleeding and had happened this morning. She also thinks she has right-sided abdominal cramping. Does not report any nausea. Is not taking prenatal vitamins and has not seen an OB/GYN yet this pregnancy. Denies any burning with urination.   Past Medical History  Diagnosis Date  . Depression with anxiety initally at age 48  . Chlamydia 2007    bacterial vaginosis 09/2011  . Irregular heart beat 2010    wt/diet related after evaluation  . Renal disorder   . Pneumothorax, spontaneous, tension   . Alcohol abuse 11/2014    drinking since age 78. chronic, recurrent. at least 2 detox admits before 2015.   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   . Failure to thrive in adult 12/2014.     Malnutrition: n/v, not eating, weight loss, BMI 14.  s/p 01/11/2015 PEG (Dr Dorna Leitz).   . Alcoholic hepatitis 02/5915    hepatic steatosis on 11/2014 ultrasound.   . Seizure ([place]) 06/2015    due to ETOH/benzo withdrawal.   . Thrombocytopenia ([place]) 04/2007  . Colitis 12/2014    colonoscopy for diarrhea and abnml CT 01/06/15: erythmatous TI (path: active ileitis, ? emerging IBD), rectal erythema (path: mucosal  prolapse). Random bx of normal colon (path unremarkable)   . Spontaneous pneumothorax 08/2012    right.  chest tube placed.     Patient Active Problem List   Diagnosis Date Noted  . Alcohol withdrawal seizure ([place]) 12/07/2015  . Substance induced mood disorder ([place]) 10/18/2015  . Alcohol abuse 10/18/2015  . Noncompliance 10/18/2015  . Ascites 10/18/2015  . Portal hypertension ([place]) 10/18/2015  . 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 CAP (community acquired pneumonia) 10/05/2015  . Hepatitis 08/08/2015  . Nausea & vomiting 08/08/2015  . Alcoholic hepatitis without ascites   . Thrombocytopenia ([place]) 07/11/2015  . Hyponatremia 07/11/2015  . Alcoholic hepatitis with ascites   . Alcoholic hepatitis 38/46/6599  . Alcohol intoxication ([place]) 07/09/2015  . Withdrawal seizures ([place]) 07/01/2015  . 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 Drug withdrawal seizure ([place]) 06/21/2015  . Chest pain 05/20/2015  . GAD (generalized anxiety disorder) 04/17/2015  . Panic disorder 04/17/2015  . Alcohol withdrawal ([place]) 04/16/2015  . Abdominal pain, generalized 04/16/2015  . Vomiting and diarrhea   . Failure to thrive in adult   . Intractable nausea and vomiting 03/01/2015  . Hypokalemia 03/01/2015  . Protein-calorie malnutrition, severe ([place]) 01/05/2015  . Generalized anxiety disorder 11/26/2014    Class: Chronic  . Alcohol use disorder, severe, dependence ([place]) 11/25/2014  . Adnexal mass 04/10/2012    Past Surgical History  Procedure Laterality Date  . Cesarean section  04/2007, 07/2009  x 2.  G3, Para 2012 as of 07/2009.   Marland Kitchen Chest tube insertion    . Esophagogastroduodenoscopy N/A 01/06/2015    ;[facility], Dr Rayann Heman. For wt loss, N/V: Normal study, duodenal biopsy/pathology: chronic active duodenitis.   . Colonoscopy with propofol N/A 01/06/2015    [facility], Dr Rayann Heman. colonoscopy for diarrhea and abnml CT 01/06/15: erythmatous TI (path: active ileitis, ? emerging IBD), rectal erythema (path: mucosal  prolapse). Random bx of normal colon (path unremarkable)   . Peg placement N/A 01/11/2015    [facility], Dr Rayann Heman.  for N/V/wt loss/severe malnutrition.   . Esophagogastroduodenoscopy N/A 01/11/2015    Rein-normal with PEG placement    Current Outpatient Rx  Name  Route  Sig  Dispense  Refill  . chlordiazePOXIDE (LIBRIUM) 25 MG capsule      25 mg PO TID x 2 days, then 25 mg PO BID X  2 days, then 25 mg PO QD X 2 days   12 capsule   0   . clonazePAM (KLONOPIN) 0.25 MG disintegrating tablet   Oral   Take 0.25 mg by mouth 2 (two) times daily.      1   . ondansetron (ZOFRAN) 4 MG tablet   Oral   Take 1 tablet (4 mg total) by mouth every 6 (six) hours as needed for nausea.   20 tablet   0   . Prenatal Vit-Fe Fumarate-FA (PRENATAL MULTIVITAMIN) TABS tablet   Oral   Take 1 tablet by mouth daily at 12 noon.   30 tablet   0     Allergies Aspirin; Effexor; Sulfa antibiotics; Tetracyclines & related; Trazodone and nefazodone; Latex; Paxil; Seroquel; and Zoloft  Family History  Problem Relation Age of Onset  . Anesthesia problems Neg Hx   . Thyroid disease Mother   . Cancer Father   . Stroke Other   . Crohn's disease Brother     Social History Social History  Substance Use Topics  . Smoking status: Current Every Day Smoker -- 2.00 packs/day    Types: Cigarettes  . Smokeless tobacco: Never Used  . Alcohol Use: 21.6 oz/week    36 Cans of beer per week     Comment: 6 beers/day; trying to quit, participating in AA    Review of Systems Constitutional: No fever/chills Eyes: No visual changes. ENT: No sore throat. Cardiovascular: Denies chest pain. Respiratory: Denies shortness of breath. Gastrointestinal:   No nausea, no vomiting.  No diarrhea.  No constipation. Genitourinary: Negative for dysuria. Musculoskeletal: Negative for back pain. Skin: Negative for rash. Neurological: Negative for headaches, focal weakness or numbness.  10-point ROS otherwise  negative.  ____________________________________________   PHYSICAL EXAM:  VITAL SIGNS: ED Triage Vitals  Enc Vitals Group     BP 12/20/15 1912 119/88 mmHg     Pulse Rate 12/20/15 1912 130     Resp 12/20/15 1912 18     Temp 12/20/15 1912 98.9 F (37.2 C)     Temp Source 12/20/15 1912 Oral     SpO2 12/20/15 1912 97 %     Weight 12/20/15 1912 112 lb (50.803 kg)     Height 12/20/15 1912 5' 6"  (1.676 m)     Head Cir --      Peak Flow --      Pain Score 12/20/15 1915 9     Pain Loc --      Pain Edu? --      Excl. in [place]? --     Constitutional: Alert  and oriented. Heart and disheveled. Eyes: Conjunctivae are normal. PERRL. EOMI. Head: Atraumatic. Nose: No congestion/rhinnorhea. Mouth/Throat: Mucous membranes are moist.   Neck: No stridor.   Cardiovascular: Cardiac, regular rhythm. Grossly normal heart sounds.  Good peripheral circulation. Respiratory: Normal respiratory effort.  No retractions. Lungs CTAB. Gastrointestinal: Soft with mild tenderness to palpation across the lower abdomen. There is no rebound or guarding. No distention.  No CVA tenderness. Genitourinary:  Normal external examination. Speculum exam without any blood or discharge in the vault. Bimanual exam with a closed cervical os. Mild uterine and adnexal tenderness bilaterally without any masses. CMT. Musculoskeletal: No lower extremity tenderness nor edema.   Neurologic:  Normal speech and language. No gross focal neurologic deficits are appreciated. Tremulousness the bilateral hands. Skin:  Skin is warm, dry and intact. No rash noted. Psychiatric: Mood and affect are normal. Speech and behavior are normal.  ____________________________________________   LABS (all labs ordered are listed, but only abnormal results are displayed)  Labs Reviewed  CBC - Abnormal; Notable for the following:    WBC 12.7 (*)    RDW 16.6 (*)    All other components within normal limits  CHLAMYDIA/NGC RT PCR ([facility] ONLY)   WET PREP, GENITAL  HCG, QUANTITATIVE, PREGNANCY  ETHANOL  BASIC METABOLIC PANEL  URINE DRUG SCREEN, QUALITATIVE ([facility] ONLY)  URINALYSIS COMPLETEWITH MICROSCOPIC ([facility] ONLY)  ABO/RH   ____________________________________________  EKG  ED ECG REPORT I, Doran Stabler, the attending physician, personally viewed and interpreted this ECG.   Date: 12/20/2015  EKG Time: 1919  Rate: 117  Rhythm: sinus tachycardia  Axis: Normal  Intervals:none  ST&T Change: No ST segment elevation or depression. T-wave inversion in V3 and 4.  Similar appearance to the EKG of 08/08/2015. ____________________________________________  RADIOLOGY   ____________________________________________   PROCEDURES   ____________________________________________   INITIAL IMPRESSION / ASSESSMENT AND PLAN / ED COURSE  Pertinent labs & imaging results that were available during my care of the patient were reviewed by me and considered in my medical decision making (see chart for details).  ----------------------------------------- 9:21 PM on 12/20/2015 -----------------------------------------  The patient was initially hypertensive and remains tachycardic. I discussed the case with Dr. Marcelline Mates, the OB/GYN on call, who says that despite the medications having severe side effects and pregnancy that benzodiazepines should still be used to protect the mother from further seizures. I explained this to the mother that the medicines are used for alcohol withdrawal can cause severe birth defects, miscarriage and dependency after birth. She understands and agrees to the treatment. Week discussed that if the mother were to become severely ill and possibly die that it would not be of benefit to anyone and she agrees with this. She understands the risks and is willing to agree with alcohol withdrawal treatment.  The patient was also seen a [place] several days ago and refused inpatient treatment at that time.  She had a similar sort of a higher alcohol level. It is possible the patient could be withdrawing at this level of 209 given her heavy alcohol consumption is now patient. Pending ultrasound.  The patient will be admitted to the hospital. Signed out to Dr. Melynda Ripple.    ____________________________________________   FINAL CLINICAL IMPRESSION(S) / ED DIAGNOSES  First trimester vaginal bleeding. Acute alcohol withdrawal.    Orbie Pyo, MD 12/20/15 2125

## 2015-12-20 NOTE — [facility] Counselor (Signed)
Spoke with Dr. Clearnce Hasten regarding detox programs for pt once medically cleared.  Pt can check with [facility] in [place] for their crisis detox programs.

## 2015-12-21 ENCOUNTER — Encounter: Payer: Self-pay | Admitting: *Deleted

## 2015-12-21 DIAGNOSIS — Z331 Pregnant state, incidental: Secondary | ICD-10-CM

## 2015-12-21 DIAGNOSIS — F1020 Alcohol dependence, uncomplicated: Secondary | ICD-10-CM

## 2015-12-21 DIAGNOSIS — N939 Abnormal uterine and vaginal bleeding, unspecified: Secondary | ICD-10-CM | POA: Diagnosis not present

## 2015-12-21 DIAGNOSIS — R569 Unspecified convulsions: Secondary | ICD-10-CM

## 2015-12-21 LAB — CBC
HCT: 44.7 % (ref 35.0–47.0)
Hemoglobin: 15.2 g/dL (ref 12.0–16.0)
MCH: 30.6 pg (ref 26.0–34.0)
MCHC: 34 g/dL (ref 32.0–36.0)
MCV: 90.1 fL (ref 80.0–100.0)
PLATELETS: 256 10*3/uL (ref 150–440)
RBC: 4.96 MIL/uL (ref 3.80–5.20)
RDW: 16.5 % — ABNORMAL HIGH (ref 11.5–14.5)
WBC: 12.8 10*3/uL — ABNORMAL HIGH (ref 3.6–11.0)

## 2015-12-21 LAB — BASIC METABOLIC PANEL
ANION GAP: 11 (ref 5–15)
BUN: 14 mg/dL (ref 6–20)
CALCIUM: 8.4 mg/dL — AB (ref 8.9–10.3)
CO2: 20 mmol/L — AB (ref 22–32)
CREATININE: 0.49 mg/dL (ref 0.44–1.00)
Chloride: 103 mmol/L (ref 101–111)
GLUCOSE: 83 mg/dL (ref 65–99)
Potassium: 3.8 mmol/L (ref 3.5–5.1)
Sodium: 134 mmol/L — ABNORMAL LOW (ref 135–145)

## 2015-12-21 LAB — ETHANOL

## 2015-12-21 LAB — MAGNESIUM: MAGNESIUM: 1.6 mg/dL — AB (ref 1.7–2.4)

## 2015-12-21 MED ORDER — THIAMINE HCL 100 MG/ML IJ SOLN
Freq: Once | INTRAVENOUS | Status: AC
  Administered 2015-12-21: 02:00:00 via INTRAVENOUS
  Filled 2015-12-21: qty 1000

## 2015-12-21 MED ORDER — ENOXAPARIN SODIUM 40 MG/0.4ML ~~LOC~~ SOLN
40.0000 mg | SUBCUTANEOUS | Status: DC
  Administered 2015-12-21 – 2015-12-22 (×2): 40 mg via SUBCUTANEOUS
  Filled 2015-12-21 (×3): qty 0.4

## 2015-12-21 MED ORDER — ACETAMINOPHEN 325 MG PO TABS
650.0000 mg | ORAL_TABLET | Freq: Four times a day (QID) | ORAL | Status: DC | PRN
  Filled 2015-12-21: qty 2

## 2015-12-21 MED ORDER — ONDANSETRON HCL 4 MG PO TABS
4.0000 mg | ORAL_TABLET | Freq: Four times a day (QID) | ORAL | Status: DC | PRN
  Administered 2015-12-21 – 2015-12-22 (×2): 4 mg via ORAL
  Filled 2015-12-21 (×2): qty 1

## 2015-12-21 MED ORDER — CLONAZEPAM 0.125 MG PO TBDP
0.2500 mg | ORAL_TABLET | Freq: Two times a day (BID) | ORAL | Status: DC
  Administered 2015-12-21 – 2015-12-23 (×6): 0.25 mg via ORAL
  Filled 2015-12-21 (×6): qty 2

## 2015-12-21 MED ORDER — PRENATAL PLUS 27-1 MG PO TABS
1.0000 | ORAL_TABLET | Freq: Every day | ORAL | Status: DC
  Administered 2015-12-21 – 2015-12-23 (×3): 1 via ORAL
  Filled 2015-12-21 (×4): qty 1

## 2015-12-21 MED ORDER — ACETAMINOPHEN 325 MG PO TABS
325.0000 mg | ORAL_TABLET | Freq: Once | ORAL | Status: AC
  Administered 2015-12-21: 325 mg via ORAL

## 2015-12-21 MED ORDER — LORAZEPAM 2 MG/ML IJ SOLN
2.0000 mg | INTRAMUSCULAR | Status: DC | PRN
  Administered 2015-12-21: 2 mg via INTRAVENOUS
  Filled 2015-12-21 (×2): qty 1

## 2015-12-21 MED ORDER — MAGNESIUM SULFATE IN D5W 1-5 GM/100ML-% IV SOLN
1.0000 g | Freq: Once | INTRAVENOUS | Status: AC
  Administered 2015-12-21: 1 g via INTRAVENOUS
  Filled 2015-12-21: qty 100

## 2015-12-21 NOTE — Care Management (Signed)
Readmit for ETOH induced seizure, ETOH abuse and intoxication. Vaginal bleeding.  She is [redacted] weeks pregnant. Has medicaid. Psych consult pending.

## 2015-12-21 NOTE — Consult Note (Signed)
[facility] Face-to-Face Psychiatry Consult   Reason for Consult:  Consult for this 30 year old woman with alcohol dependence and alcohol withdrawal seizures Referring Physician:  Verdell Carmine Patient Identification: Kristine Macias MRN:  [id number] Principal Diagnosis: Alcohol withdrawal seizure [facility]) Diagnosis:   Patient Active Problem List   Diagnosis Date Noted  . Pregnancy [Z33.1] 12/20/2015  . SIRS  [R65.10] 12/20/2015  . Tobacco abuse [Z72.0] 12/20/2015  . Vaginal bleeding [N93.9] 12/20/2015  . Alcohol withdrawal seizure ([place]) [J62.836, R56.9] 12/07/2015  . Substance induced mood disorder ([place]) [F19.94] 10/18/2015  . Alcohol abuse [F10.10] 10/18/2015  . Noncompliance [Z91.19] 10/18/2015  . Ascites [R18.8] 10/18/2015  . Portal hypertension ([place]) [K76.6] 10/18/2015  . 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 CAP (community acquired pneumonia) [J18.9] 10/05/2015  . Hepatitis [K75.9] 08/08/2015  . Nausea & vomiting [R11.2] 08/08/2015  . Alcoholic hepatitis without ascites [K70.10]   . Thrombocytopenia ([place]) [D69.6] 07/11/2015  . Hyponatremia [E87.1] 07/11/2015  . Alcoholic hepatitis with ascites [K70.11]   . Alcoholic hepatitis [O29.47] 07/09/2015  . Alcohol intoxication ([place]) [F10.129] 07/09/2015  . Withdrawal seizures ([place]) [M54.650, R56.9] 07/01/2015  . Benzodiazepine abuse [F13.10] 07/01/2015  . Drug withdrawal seizure ([place]) [P54.656, R56.9] 06/21/2015  . Chest pain [R07.9] 05/20/2015  . GAD (generalized anxiety disorder) [F41.1] 04/17/2015  . Panic disorder [F41.0] 04/17/2015  . Alcohol withdrawal ([place]) [F10.239] 04/16/2015  . Abdominal pain, generalized [R10.84] 04/16/2015  . Vomiting and diarrhea [R11.10, R19.7]   . Failure to thrive in adult [R62.7]   . Intractable nausea and vomiting [R11.10] 03/01/2015  . Hypokalemia [E87.6] 03/01/2015  . Protein-calorie malnutrition, severe ([place]) [E43] 01/05/2015  . Generalized anxiety disorder [F41.1] 11/26/2014    Class: Chronic   . Alcohol use disorder, severe, dependence ([place]) [F10.20] 11/25/2014  . Adnexal mass [N94.9] 04/10/2012    Total Time spent with patient: 1 hour  Subjective:   Kristine Macias is a 30 y.o. female patient admitted [place] was doing okay but I tried to taper myself" patient interviewed. Chart reviewed. I'm also familiar with this patient from having consulted on her multiple times in the past. Labs and vitals reviewed. I also spoke with her mother to get some outside information. The patient tells me that she had only been drinking one half of a beer per day. She says that she thinks her problem was that she tried to discontinue that. She claims that her last drink was about 2 days ago. She came into the hospital having once again had a witnessed seizure. Patient tells me that she has still been going to outpatient substance abuse treatment. Denies that she was abusing any other drugs. She says that her mood is been feeling fine. Doesn't feel depressed or overly anxious. Denies any suicidal thoughts denies any psychotic thoughts denies any feeling of hopelessness. Her mother tells a more believable story that the patient does okay for a couple days after she leaves the hospital but then pretty quickly relapses into drinking and that when she does relapse she drinks very heavily. Mother doesn't even have an estimate of the total amount that she's been drinking but knows that she's been drinking herself into a stuporous at night. The half a beer story is clearly unbelievable given her elevated alcohol level on 2 different checks within the last week. Apparently she is still taking Klonopin a quarter milligram twice a day no evidence that she's been abusing that. No particular new stress identified.  Social history: Patient lives with her mother. She has young  children of her own who also live there. Patient is not currently working outside the home. She has a "fiance" and insists that it's a good  relationship with him.  Medical history: Patient has a history of malnutrition. Primarily probably related to not eating when she is drinking heavily. As far as we can tell she doesn't have anorexia. 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 She is being followed by a gastroenterologist for that. She has now developed recurrent alcohol withdrawal seizures. Additionally she is now pregnant with an estimated gestational age of about 15 weeks and states that she intends to keep the pregnancy.  Substance abuse history: Patient has an established long history of alcohol abuse. She has been involved in outpatient substance abuse treatment and according to the mother was doing pretty well going to [facility] and engaging in group therapy but once she relapses she gets embarrassed and stops going back to follow-up. Patient tends to avoid going to inpatient programs. As is clear in my interactions with her she minimizes her problems to avoid talking about it.   Past Psychiatric History:patient has a history of alcohol abuse chronic anxiety disorder. Poor tolerance of SSRIs. Currently being prescribed standing clonazepam although I don't think it looks like that's probably much help. Denies any suicidal ideation. Has not been admitted to our hospital in the past. Does follow up with outpatient substance abuse treatment at least intermittently. to Self: Is patient at risk for suicide?: No Risk to Others:   Prior Inpatient Therapy:   Prior Outpatient Therapy:    Past Medical History:  Past Medical History  Diagnosis Date  . Depression with anxiety initally at age 30  . Chlamydia 2007    bacterial vaginosis 09/2011  . Irregular heart beat 2010    wt/diet related after evaluation  . Renal disorder   . Pneumothorax, spontaneous, tension   . Alcohol abuse 11/2014    drinking since age 49. chronic, recurrent. at least 2 detox admits before 2015.   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    . Failure to thrive in adult 12/2014.     Malnutrition: n/v, not eating, weight loss, BMI 14.  s/p 01/11/2015 PEG (Dr Dorna Leitz).   . Alcoholic hepatitis 08/7492    hepatic steatosis on 11/2014 ultrasound.   . Seizure ([place]) 06/2015    due to ETOH/benzo withdrawal.   . Thrombocytopenia ([place]) 04/2007  . Colitis 12/2014    colonoscopy for diarrhea and abnml CT 01/06/15: erythmatous TI (path: active ileitis, ? emerging IBD), rectal erythema (path: mucosal prolapse). Random bx of normal colon (path unremarkable)   . Spontaneous pneumothorax 08/2012    right.  chest tube placed.     Past Surgical History  Procedure Laterality Date  . Cesarean section  04/2007, 07/2009     x 2.  G3, Para 2012 as of 07/2009.   Marland Kitchen Chest tube insertion    . Esophagogastroduodenoscopy N/A 01/06/2015    ;[facility], Dr Rayann Heman. For wt loss, N/V: Normal study, duodenal biopsy/pathology: chronic active duodenitis.   . Colonoscopy with propofol N/A 01/06/2015    [facility], Dr Rayann Heman. colonoscopy for diarrhea and abnml CT 01/06/15: erythmatous TI (path: active ileitis, ? emerging IBD), rectal erythema (path: mucosal prolapse). Random bx of normal colon (path unremarkable)   . Peg placement N/A 01/11/2015    [facility], Dr Rayann Heman.  for N/V/wt loss/severe malnutrition.   . Esophagogastroduodenoscopy N/A 01/11/2015    Rein-normal with PEG placement  Family History:  Family History  Problem Relation Age of Onset  . Anesthesia problems Neg Hx   . Thyroid disease Mother   . Cancer Father   . Stroke Other   . Crohn's disease Brother    Family Psychiatric  Historyfamily history of substance abusel History:  History  Alcohol Use  . 21.6 oz/week  . 34 Cans of beer per week    Comment: 6 beers/day; trying to quit, participating in AA     History  Drug Use No    Social History   Social History  . Marital Status: Single    Spouse Name: N/A  . Number of Children: N/A  . Years of Education: N/A   Social History Main Topics  . Smoking status: Current  Every Day Smoker -- 2.00 packs/day    Types: Cigarettes  . Smokeless tobacco: Never Used  . Alcohol Use: 21.6 oz/week    36 Cans of beer per week     Comment: 6 beers/day; trying to quit, participating in [place]  . Drug Use: No  . Sexual Activity: Yes    Birth Control/ Protection: None   Other Topics Concern  . None   Social History Narrative   Lives with mother & 2 children   disabled   Additional Social History:    Allergies:   Allergies  Allergen Reactions  . Aspirin Shortness Of Breath  . Effexor [Venlafaxine] Anaphylaxis  . Sulfa Antibiotics Anaphylaxis  . Tetracyclines & Related Anaphylaxis  . Trazodone And Nefazodone Anaphylaxis  . Latex Hives and Itching  . Paxil [Paroxetine] Hives and Itching  . Seroquel [Quetiapine Fumarate] Hives and Itching  . Zoloft [Sertraline Hcl] Hives and Itching    Labs:  Results for orders placed or performed during the hospital encounter of 12/20/15 (from the past 48 hour(s))  hCG, quantitative, pregnancy     Status: Abnormal   Collection Time: 12/20/15  7:25 PM  Result Value Ref Range   hCG, Beta Chain, Quant, S 185540 (H) <5 mIU/mL    Comment:          GEST. AGE      CONC.  (mIU/mL)   <=1 WEEK        5 - 50     2 WEEKS       50 - 500     3 WEEKS       100 - 10,000     4 WEEKS     1,000 - 30,000     5 WEEKS     3,500 - 115,000   6-8 WEEKS     12,000 - 270,000    12 WEEKS     15,000 - 220,000        FEMALE AND NON-PREGNANT FEMALE:     LESS THAN 5 mIU/mL   Ethanol     Status: Abnormal   Collection Time: 12/20/15  7:25 PM  Result Value Ref Range   Alcohol, Ethyl (B) 209 (H) <5 mg/dL    Comment:        LOWEST DETECTABLE LIMIT FOR SERUM ALCOHOL IS 5 mg/dL FOR MEDICAL PURPOSES ONLY   CBC     Status: Abnormal   Collection Time: 12/20/15  7:25 PM  Result Value Ref Range   WBC 12.7 (H) 3.6 - 11.0 K/uL   RBC 5.04 3.80 - 5.20 MIL/uL   Hemoglobin 15.4 12.0 - 16.0 g/dL   HCT 46.0 35.0 - 47.0 %   MCV 91.3 80.0 - 100.0 fL  MCH  30.5 26.0 - 34.0 pg   MCHC 33.4 32.0 - 36.0 g/dL   RDW 16.6 (H) 11.5 - 14.5 %   Platelets 290 150 - 440 K/uL  ABO/Rh     Status: None   Collection Time: 12/20/15  7:25 PM  Result Value Ref Range   ABO/RH(D) O POS   Basic metabolic panel     Status: Abnormal   Collection Time: 12/20/15  7:25 PM  Result Value Ref Range   Sodium 132 (L) 135 - 145 mmol/L   Potassium 3.9 3.5 - 5.1 mmol/L   Chloride 100 (L) 101 - 111 mmol/L   CO2 21 (L) 22 - 32 mmol/L   Glucose, Bld 124 (H) 65 - 99 mg/dL   BUN 17 6 - 20 mg/dL   Creatinine, Ser 0.51 0.44 - 1.00 mg/dL   Calcium 8.7 (L) 8.9 - 10.3 mg/dL   GFR calc non Af Amer >60 >60 mL/min   GFR calc Af Amer >60 >60 mL/min    Comment: (NOTE) The eGFR has been calculated using the CKD EPI equation. This calculation has not been validated in all clinical situations. eGFR's persistently <60 mL/min signify possible Chronic Kidney Disease.    Anion gap 11 5 - 15  Hepatic function panel     Status: Abnormal   Collection Time: 12/20/15  7:25 PM  Result Value Ref Range   Total Protein 7.6 6.5 - 8.1 g/dL   Albumin 3.6 3.5 - 5.0 g/dL   AST 795 (H) 15 - 41 U/L   ALT 425 (H) 14 - 54 U/L   Alkaline Phosphatase 177 (H) 38 - 126 U/L   Total Bilirubin 0.9 0.3 - 1.2 mg/dL   Bilirubin, Direct 0.4 0.1 - 0.5 mg/dL   Indirect Bilirubin 0.5 0.3 - 0.9 mg/dL  Lipase, blood     Status: None   Collection Time: 12/20/15  7:25 PM  Result Value Ref Range   Lipase 13 11 - 51 U/L  Urinalysis complete, with microscopic ([facility] only)     Status: Abnormal   Collection Time: 12/20/15  7:58 PM  Result Value Ref Range   Color, Urine AMBER (A) YELLOW   APPearance CLEAR (A) CLEAR   Glucose, UA 50 (A) NEGATIVE mg/dL   Bilirubin Urine NEGATIVE NEGATIVE   Ketones, ur TRACE (A) NEGATIVE mg/dL   Specific Gravity, Urine 1.030 1.005 - 1.030   Hgb urine dipstick NEGATIVE NEGATIVE   pH 5.0 5.0 - 8.0   Protein, ur 30 (A) NEGATIVE mg/dL   Nitrite NEGATIVE NEGATIVE   Leukocytes, UA  NEGATIVE NEGATIVE   RBC / HPF 0-5 0 - 5 RBC/hpf   WBC, UA NONE SEEN 0 - 5 WBC/hpf   Bacteria, UA NONE SEEN NONE SEEN   Squamous Epithelial / LPF 0-5 (A) NONE SEEN   Mucous PRESENT   Chlamydia/NGC rt PCR ([facility] only)     Status: None   Collection Time: 12/20/15  7:58 PM  Result Value Ref Range   Specimen source GC/Chlam ENDOCERVICAL    Chlamydia Tr NOT DETECTED NOT DETECTED   N gonorrhoeae NOT DETECTED NOT DETECTED    Comment: (NOTE) 100  This methodology has not been evaluated in pregnant women or in 200  patients with a history of hysterectomy. 300 400  This methodology will not be performed on patients less than 78  years of age.   Wet prep, genital     Status: Abnormal   Collection Time: 12/20/15  7:58 PM  Result  Value Ref Range   Yeast Wet Prep HPF POC NONE SEEN NONE SEEN   Trich, Wet Prep NONE SEEN NONE SEEN   Clue Cells Wet Prep HPF POC NONE SEEN NONE SEEN   WBC, Wet Prep HPF POC RARE (A) NONE SEEN   Sperm NONE SEEN   Urine Drug Screen, Qualitative ([facility] only)     Status: Abnormal   Collection Time: 12/20/15  7:58 PM  Result Value Ref Range   Tricyclic, Ur Screen NONE DETECTED NONE DETECTED   Amphetamines, Ur Screen NONE DETECTED NONE DETECTED   MDMA (Ecstasy)Ur Screen NONE DETECTED NONE DETECTED   Cocaine Metabolite,Ur [place] NONE DETECTED NONE DETECTED   Opiate, Ur Screen NONE DETECTED NONE DETECTED   Phencyclidine (PCP) Ur S NONE DETECTED NONE DETECTED   Cannabinoid 50 Ng, Ur [place] NONE DETECTED NONE DETECTED   Barbiturates, Ur Screen NONE DETECTED NONE DETECTED   Benzodiazepine, Ur Scrn POSITIVE (A) NONE DETECTED   Methadone Scn, Ur NONE DETECTED NONE DETECTED    Comment: (NOTE) 173  Tricyclics, urine               Cutoff 1000 ng/mL 200  Amphetamines, urine             Cutoff 1000 ng/mL 300  MDMA (Ecstasy), urine           Cutoff 500 ng/mL 400  Cocaine Metabolite, urine       Cutoff 300 ng/mL 500  Opiate, urine                   Cutoff 300 ng/mL 600  Phencyclidine  (PCP), urine      Cutoff 25 ng/mL 700  Cannabinoid, urine              Cutoff 50 ng/mL 800  Barbiturates, urine             Cutoff 200 ng/mL 900  Benzodiazepine, urine           Cutoff 200 ng/mL 1000 Methadone, urine                Cutoff 300 ng/mL 1100 1200 The urine drug screen provides only a preliminary, unconfirmed 1300 analytical test result and should not be used for non-medical 1400 purposes. Clinical consideration and professional judgment should 1500 be applied to any positive drug screen result due to possible 1600 interfering substances. A more specific alternate chemical method 1700 must be used in order to obtain a confirmed analytical result.  1800 Gas chromato graphy / mass spectrometry (GC/MS) is the preferred 1900 confirmatory method.   Magnesium     Status: Abnormal   Collection Time: 12/21/15  2:17 AM  Result Value Ref Range   Magnesium 1.6 (L) 1.7 - 2.4 mg/dL  Basic metabolic panel     Status: Abnormal   Collection Time: 12/21/15  2:17 AM  Result Value Ref Range   Sodium 134 (L) 135 - 145 mmol/L   Potassium 3.8 3.5 - 5.1 mmol/L   Chloride 103 101 - 111 mmol/L   CO2 20 (L) 22 - 32 mmol/L   Glucose, Bld 83 65 - 99 mg/dL   BUN 14 6 - 20 mg/dL   Creatinine, Ser 0.49 0.44 - 1.00 mg/dL   Calcium 8.4 (L) 8.9 - 10.3 mg/dL   GFR calc non Af Amer >60 >60 mL/min   GFR calc Af Amer >60 >60 mL/min    Comment: (NOTE) The eGFR has been calculated using the CKD EPI equation. This  calculation has not been validated in all clinical situations. eGFR's persistently <60 mL/min signify possible Chronic Kidney Disease.    Anion gap 11 5 - 15  CBC     Status: Abnormal   Collection Time: 12/21/15  2:17 AM  Result Value Ref Range   WBC 12.8 (H) 3.6 - 11.0 K/uL   RBC 4.96 3.80 - 5.20 MIL/uL   Hemoglobin 15.2 12.0 - 16.0 g/dL   HCT 44.7 35.0 - 47.0 %   MCV 90.1 80.0 - 100.0 fL   MCH 30.6 26.0 - 34.0 pg   MCHC 34.0 32.0 - 36.0 g/dL   RDW 16.5 (H) 11.5 - 14.5 %   Platelets  256 150 - 440 K/uL  Ethanol     Status: None   Collection Time: 12/21/15  6:25 AM  Result Value Ref Range   Alcohol, Ethyl (B) <5 <5 mg/dL    Comment:        LOWEST DETECTABLE LIMIT FOR SERUM ALCOHOL IS 5 mg/dL FOR MEDICAL PURPOSES ONLY     Current Facility-Administered Medications  Medication Dose Route Frequency Provider Last Rate Last Dose  . acetaminophen (TYLENOL) tablet 650 mg  650 mg Oral Q6H PRN Henreitta Leber, MD   650 mg at 12/21/15 1007  . clonazePAM (KLONOPIN) disintegrating tablet 0.25 mg  0.25 mg Oral BID Sylvan Cheese, MD   0.25 mg at 12/21/15 1007  . enoxaparin (LOVENOX) injection 40 mg  40 mg Subcutaneous Q24H Sylvan Cheese, MD   40 mg at 12/21/15 0253  . LORazepam (ATIVAN) injection 0-4 mg  0-4 mg Intravenous Q6H Orbie Pyo, MD   1 mg at 12/21/15 0559   Followed by  . [START ON 12/22/2015] LORazepam (ATIVAN) injection 0-4 mg  0-4 mg Intravenous Q12H Orbie Pyo, MD      . LORazepam (ATIVAN) injection 2 mg  2 mg Intravenous Q5 min PRN Sylvan Cheese, MD   2 mg at 12/21/15 1518  . ondansetron (ZOFRAN) tablet 4 mg  4 mg Oral Q6H PRN Sylvan Cheese, MD      . prenatal multivitamin tablet 1 tablet  1 tablet Oral Daily Sylvan Cheese, MD   1 tablet at 12/21/15 1007    Musculoskeletal: Strength & Muscle Tone: decreased Gait & Station: unable to stand Patient leans: N/A  Psychiatric Specialty Exam: Review of Systems  Constitutional: Positive for malaise/fatigue.  HENT: Negative.   Eyes: Negative.   Respiratory: Negative.   Cardiovascular: Negative.   Gastrointestinal: Negative.   Musculoskeletal: Negative.   Skin: Negative.   Neurological: Positive for seizures.  Psychiatric/Behavioral: Positive for substance abuse. Negative for depression, suicidal ideas, hallucinations and memory loss. The patient is nervous/anxious and has insomnia.     Blood pressure 99/59, pulse 96, temperature 99.9 F (37.7 C), temperature source Oral, resp.  rate 18, height 5' 6" (1.676 m), weight 48.762 kg (107 lb 8 oz), last menstrual period 07/28/2015, SpO2 98 %.Body mass index is 17.36 kg/(m^2).  General Appearance: Casual  Eye Contact::  Fair  Speech:  Slow  Volume:  Decreased  Mood:  Euthymic  Affect:  Constricted  Thought Process:  Goal Directed  Orientation:  Full (Time, Place, and Person)  Thought Content:  Negative  Suicidal Thoughts:  No  Homicidal Thoughts:  No  Memory:  Immediate;   Good Recent;   Fair Remote;   Fair  Judgement:  Impaired  Insight:  Shallow  Psychomotor Activity:  Decreased  Concentration:  Fair  Recall:  [employer]  of Knowledge:Fair  Language: Fair  Akathisia:  No  Handed:  Right  AIMS (if indicated):     Assets:  Desire for Improvement Financial Resources/Insurance Housing Resilience Social Support  ADL's:  Intact  Cognition: WNL  Sleep:      Treatment Plan Summary: Daily contact with patient to assess and evaluate symptoms and progress in treatment, Medication management and Plan who is pregnant once again in the hospital in alcohol withdrawal having had an alcohol withdrawal seizure. Currently her vital signs are stable. Her blood pressure is actually a little bit low. Her poor nutritional status makes it slightly harder to judge how well she is detoxing's and she runs a low blood pressure anyway. She does still have some clear tremor in her hands. On the other hand she is not delirious at all. Given that she is now prone to alcohol withdrawal seizures and the importance of trying to avoid these particularly when she is pregnant and I think that we should try and get her completely detoxed in the hospital rather than trying to discharge her on Librium. That plan was used last time and did not work.it would be ideal if she could stay in the hospital until she was completely off the Ativan to make sure she is not going to have a seizure. I am on the fence about whether to discontinue her Klonopin. I  think it would be better for her to not have benzodiazepines but it looks likely that this can be prescribed outpatient and it might be better not to start a fight about that. I'm going to see if we can find any other substance abuse counselors or substance abuse treatment is available for her. I will continue to meet with her regularly. I did not going to put any changes to her orders in the current ones appear adequate for detox.   Disposition: Patient does not meet criteria for psychiatric inpatient admission. Supportive therapy provided about ongoing stressors.  Alethia Berthold, MD 12/21/2015 4:52 PM

## 2015-12-21 NOTE — Progress Notes (Signed)
Kristine Macias at [place] NAME: Kristine Macias    MR#:  [id number]  DATE OF BIRTH:  22-Nov-1985  SUBJECTIVE:   Patient here due to a suspected alcohol withdrawal seizure. No further seizures overnight. Also had some vaginal bleeding but has stopped also. No other complaints.  REVIEW OF SYSTEMS:    Review of Systems  Constitutional: Negative for fever and chills.  HENT: Negative for congestion and tinnitus.   Eyes: Negative for blurred vision and double vision.  Respiratory: Negative for cough, shortness of breath and wheezing.   Cardiovascular: Negative for chest pain, orthopnea and PND.  Gastrointestinal: Negative for nausea, vomiting, abdominal pain and diarrhea.  Genitourinary: Negative for dysuria and hematuria.  Neurological: Negative for dizziness, sensory change and focal weakness.  All other systems reviewed and are negative.   Nutrition: Regular Tolerating Diet: yes Tolerating PT: Ambulatory   DRUG ALLERGIES:   Allergies  Allergen Reactions  . Aspirin Shortness Of Breath  . Effexor [Venlafaxine] Anaphylaxis  . Sulfa Antibiotics Anaphylaxis  . Tetracyclines & Related Anaphylaxis  . Trazodone And Nefazodone Anaphylaxis  . Latex Hives and Itching  . Paxil [Paroxetine] Hives and Itching  . Seroquel [Quetiapine Fumarate] Hives and Itching  . Zoloft [Sertraline Hcl] Hives and Itching    VITALS:  Blood pressure 99/59, pulse 96, temperature 99.9 F (37.7 C), temperature source Oral, resp. rate 18, height 5' 6"  (1.676 m), weight 48.762 kg (107 lb 8 oz), last menstrual period 07/28/2015, SpO2 98 %.  PHYSICAL EXAMINATION:   Physical Exam  GENERAL:  30 y.o.-year-old patient lying in the bed lethargic but in no acute distress.  EYES: Pupils equal, round, reactive to light and accommodation. No scleral icterus. Extraocular muscles intact.  HEENT: Head atraumatic, normocephalic. Oropharynx and nasopharynx clear.  NECK:  Supple, no  jugular venous distention. No thyroid enlargement, no tenderness.  LUNGS: Normal breath sounds bilaterally, no wheezing, rales, rhonchi. No use of accessory muscles of respiration.  CARDIOVASCULAR: S1, S2 normal. No murmurs, rubs, or gallops.  ABDOMEN: Soft, nontender, nondistended. Bowel sounds present. No organomegaly or mass.  EXTREMITIES: No cyanosis, clubbing or edema b/l.    NEUROLOGIC: Cranial nerves II through XII are intact. No focal Motor or sensory deficits b/l.   PSYCHIATRIC: The patient is alert and oriented x 3.  SKIN: No obvious rash, lesion, or ulcer.    LABORATORY PANEL:   CBC  Recent Labs Lab 12/21/15 0217  WBC 12.8*  HGB 15.2  HCT 44.7  PLT 256   ------------------------------------------------------------------------------------------------------------------  Chemistries   Recent Labs Lab 12/20/15 1925 12/21/15 0217  NA 132* 134*  K 3.9 3.8  CL 100* 103  CO2 21* 20*  GLUCOSE 124* 83  BUN 17 14  CREATININE 0.51 0.49  CALCIUM 8.7* 8.4*  MG  --  1.6*  AST 795*  --   ALT 425*  --   ALKPHOS 177*  --   BILITOT 0.9  --    ------------------------------------------------------------------------------------------------------------------  Cardiac Enzymes No results for input(s): TROPONINI in the last 168 hours. ------------------------------------------------------------------------------------------------------------------  RADIOLOGY:  US Ob Comp Less 14 Wks  12/20/2015  CLINICAL DATA:  Acute onset of vaginal bleeding.  Initial encounter. EXAM: OBSTETRIC <14 WK [place] AND TRANSVAGINAL OB US TECHNIQUE: Both transabdominal and transvaginal ultrasound examinations were performed for complete evaluation of the gestation as well as the maternal uterus, adnexal regions, and pelvic cul-de-sac. Transvaginal technique was performed to assess early pregnancy. COMPARISON:  Pelvic ultrasound performed 12/16/2015, and CT  of the abdomen and pelvis performed 08/08/2015  FINDINGS: Intrauterine gestational sac: Visualized; normal in shape. Yolk sac:  Yes Embryo:  Yes Cardiac Activity: Yes Heart Rate: 180  bpm CRL:  2.27 cm   9 w   0 d                  [place] EDC: 07/24/2016 Subchorionic hemorrhage:  None visualized. Maternal uterus/adnexae: The uterus is otherwise unremarkable in appearance. The right ovary is within normal limits. It measures 2.2 x 1.2 x 1.6 cm. The left ovary is not visualized due to overlying bowel gas. No suspicious adnexal masses are seen. No free fluid is seen within the pelvic cul-de-sac. IMPRESSION: Single live intrauterine pregnancy noted, with a crown-rump length of 2.3 cm, corresponding to a gestational age of [redacted] weeks 0 days. This matches the gestational age of [redacted] weeks 2 days by the first ultrasound, reflecting an estimated date of delivery of July 22, 2016. Electronically Signed   By: Garald Balding M.D.   On: 12/20/2015 22:28   US Ob Transvaginal  12/20/2015  CLINICAL DATA:  Acute onset of vaginal bleeding.  Initial encounter. EXAM: OBSTETRIC <14 WK [place] AND TRANSVAGINAL OB US TECHNIQUE: Both transabdominal and transvaginal ultrasound examinations were performed for complete evaluation of the gestation as well as the maternal uterus, adnexal regions, and pelvic cul-de-sac. Transvaginal technique was performed to assess early pregnancy. COMPARISON:  Pelvic ultrasound performed 12/16/2015, and CT of the abdomen and pelvis performed 08/08/2015 FINDINGS: Intrauterine gestational sac: Visualized; normal in shape. Yolk sac:  Yes Embryo:  Yes Cardiac Activity: Yes Heart Rate: 180  bpm CRL:  2.27 cm   9 w   0 d                  [place] EDC: 07/24/2016 Subchorionic hemorrhage:  None visualized. Maternal uterus/adnexae: The uterus is otherwise unremarkable in appearance. The right ovary is within normal limits. It measures 2.2 x 1.2 x 1.6 cm. The left ovary is not visualized due to overlying bowel gas. No suspicious adnexal masses are seen. No free fluid is seen  within the pelvic cul-de-sac. IMPRESSION: Single live intrauterine pregnancy noted, with a crown-rump length of 2.3 cm, corresponding to a gestational age of [redacted] weeks 0 days. This matches the gestational age of [redacted] weeks 2 days by the first ultrasound, reflecting an estimated date of delivery of July 22, 2016. Electronically Signed   By: Garald Balding M.D.   On: 12/20/2015 22:28     ASSESSMENT AND PLAN:   30 year old female with past medical history of alcohol abuse, depression with anxiety, history of alcoholic hepatitis, history of alcohol withdrawal seizures, spontaneous pneumothorax who presented to the hospital due to a alcohol withdrawal seizure and also some vaginal bleeding.  1. Seizures-thought to be secondary to alcohol withdrawal. Patient although had alcoholic drink yesterday. -Continue CIWA protocol. No acute seizures overnight.  2. Vaginal bleeding-patient is [redacted] weeks pregnant. Patient's ultrasound does show a live intrauterine pregnancy at 9 weeks. -No further acute bleeding overnight. Await further OB/GYN input. -Continue prenatal vitamins.  3. Depression-patient denies any suicidal/homicidal ideations. -Continue one-to-one sitter for now, await psychiatric input.  4. Anxiety-continue Klonopin.   All the records are reviewed and case discussed with Care Management/Social Workerr. Management plans discussed with the patient, family and they are in agreement.  CODE STATUS: Full  DVT Prophylaxis: Lovenox  TOTAL TIME TAKING CARE OF THIS PATIENT: 30 minutes.   POSSIBLE D/C IN 1-2 DAYS, DEPENDING  ON CLINICAL CONDITION.   Henreitta Leber M.D on 12/21/2015 at 3:11 PM  Between 7am to 6pm - Pager - [phone]  After 6pm go to [email or web address] - password [email or web address] [facility]  [facility]  Office  [phone]  CC: Primary care physician; Leonides Sake, MD

## 2015-12-21 NOTE — Progress Notes (Signed)
Pt was instructed to stop smoking in the BR. Pt was educated on the risk.

## 2015-12-22 ENCOUNTER — Encounter: Payer: Self-pay | Admitting: Obstetrics and Gynecology

## 2015-12-22 NOTE — Consult Note (Signed)
[facility] Face-to-Face Psychiatry Consult   Reason for Consult:  Consult for this 30 year old woman with alcohol dependence and alcohol withdrawal seizures Referring Physician:  Verdell Carmine Patient Identification: Kristine Macias MRN:  [id number] Principal Diagnosis: Alcohol withdrawal seizure [facility]) Diagnosis:   Patient Active Problem List   Diagnosis Date Noted  . Pregnancy [Z33.1] 12/20/2015  . SIRS  [R65.10] 12/20/2015  . Tobacco abuse [Z72.0] 12/20/2015  . Vaginal bleeding [N93.9] 12/20/2015  . Alcohol withdrawal seizure ([place]) [X52.841, R56.9] 12/07/2015  . Substance induced mood disorder ([place]) [F19.94] 10/18/2015  . Alcohol abuse [F10.10] 10/18/2015  . Noncompliance [Z91.19] 10/18/2015  . Ascites [R18.8] 10/18/2015  . Portal hypertension ([place]) [K76.6] 10/18/2015  . 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 CAP (community acquired pneumonia) [J18.9] 10/05/2015  . Hepatitis [K75.9] 08/08/2015  . Nausea & vomiting [R11.2] 08/08/2015  . Alcoholic hepatitis without ascites [K70.10]   . Thrombocytopenia ([place]) [D69.6] 07/11/2015  . Hyponatremia [E87.1] 07/11/2015  . Alcoholic hepatitis with ascites [K70.11]   . Alcoholic hepatitis [L24.40] 07/09/2015  . Alcohol intoxication ([place]) [F10.129] 07/09/2015  . Withdrawal seizures ([place]) [N02.725, R56.9] 07/01/2015  . Benzodiazepine abuse [F13.10] 07/01/2015  . Drug withdrawal seizure ([place]) [D66.440, R56.9] 06/21/2015  . Chest pain [R07.9] 05/20/2015  . GAD (generalized anxiety disorder) [F41.1] 04/17/2015  . Panic disorder [F41.0] 04/17/2015  . Alcohol withdrawal ([place]) [F10.239] 04/16/2015  . Abdominal pain, generalized [R10.84] 04/16/2015  . Vomiting and diarrhea [R11.10, R19.7]   . Failure to thrive in adult [R62.7]   . Intractable nausea and vomiting [R11.10] 03/01/2015  . Hypokalemia [E87.6] 03/01/2015  . Protein-calorie malnutrition, severe ([place]) [E43] 01/05/2015  . Generalized anxiety disorder [F41.1] 11/26/2014    Class: Chronic   . Alcohol use disorder, severe, dependence ([place]) [F10.20] 11/25/2014  . Adnexal mass [N94.9] 04/10/2012    Total Time spent with patient: 20 minutes  Subjective:   Kristine Macias is a 30 y.o. female patient admitted [facility] was doing okay but I tried to taper myself"  Consult update as of Thursday the 27th. I spoke with hospitalist today. Patient's vital signs have been stable. Possible plan for discharge soon. On interview today the patient says that she is still feeling sick to her stomach. She also admitted that she was having visual hallucinations. I think she had not really understood what was meant by that but when I explained the kind of perceptual disturbances that can happen in alcohol withdrawal she readily admitted to it. Affect is blunted speech is minimal and quiet. Denies suicidal ideation and continues to claim that she is enthusiastic about keeping the baby. patient interviewed. Chart reviewed. I'm also familiar with this patient from having consulted on her multiple times in the past. Labs and vitals reviewed. I also spoke with her mother to get some outside information. The patient tells me that she had only been drinking one half of a beer per day. She says that she thinks her problem was that she tried to discontinue that. She claims that her last drink was about 2 days ago. She came into the hospital having once again had a witnessed seizure. Patient tells me that she has still been going to outpatient substance abuse treatment. Denies that she was abusing any other drugs. She says that her mood is been feeling fine. Doesn't feel depressed or overly anxious. Denies any suicidal thoughts denies any psychotic thoughts denies any feeling of hopelessness. Her mother tells a more believable story that the patient does okay for a couple days after  she leaves the hospital but then pretty quickly relapses into drinking and that when she does relapse she drinks very heavily. Mother doesn't  even have an estimate of the total amount that she's been drinking but knows that she's been drinking herself into a stuporous at night. The half a beer story is clearly unbelievable given her elevated alcohol level on 2 different checks within the last week. Apparently she is still taking Klonopin a quarter milligram twice a day no evidence that she's been abusing that. No particular new stress identified.  Social history: Patient lives with her mother. She has young children of her own who also live there. Patient is not currently working outside the home. She has a "fiance" and insists that it's a good relationship with him.  Medical history: Patient has a history of malnutrition. Primarily probably related to not eating when she is drinking heavily. As far as we can tell she doesn't have anorexia. 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 She is being followed by a gastroenterologist for that. She has now developed recurrent alcohol withdrawal seizures. Additionally she is now pregnant with an estimated gestational age of about 80 weeks and states that she intends to keep the pregnancy.  Substance abuse history: Patient has an established long history of alcohol abuse. She has been involved in outpatient substance abuse treatment and according to the mother was doing pretty well going to [employer] and engaging in group therapy but once she relapses she gets embarrassed and stops going back to follow-up. Patient tends to avoid going to inpatient programs. As is clear in my interactions with her she minimizes her problems to avoid talking about it.   Past Psychiatric History:patient has a history of alcohol abuse chronic anxiety disorder. Poor tolerance of SSRIs. Currently being prescribed standing clonazepam although I don't think it looks like that's probably much help. Denies any suicidal ideation. Has not been admitted to our hospital in the past. Does follow up with outpatient  substance abuse treatment at least intermittently. to Self: Is patient at risk for suicide?: No Risk to Others:   Prior Inpatient Therapy:   Prior Outpatient Therapy:    Past Medical History:  Past Medical History  Diagnosis Date  . Depression with anxiety initally at age 72  . Chlamydia 2007    bacterial vaginosis 09/2011  . Irregular heart beat 2010    wt/diet related after evaluation  . Renal disorder   . Pneumothorax, spontaneous, tension   . Alcohol abuse 11/2014    drinking since age 38. chronic, recurrent. at least 2 detox admits before 2015.   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   . Failure to thrive in adult 12/2014.     Malnutrition: n/v, not eating, weight loss, BMI 14.  s/p 01/11/2015 PEG (Dr Dorna Leitz).   . Alcoholic hepatitis 02/6194    hepatic steatosis on 11/2014 ultrasound.   . Seizure ([place]) 06/2015    due to ETOH/benzo withdrawal.   . Thrombocytopenia ([place]) 04/2007  . Colitis 12/2014    colonoscopy for diarrhea and abnml CT 01/06/15: erythmatous TI (path: active ileitis, ? emerging IBD), rectal erythema (path: mucosal prolapse). Random bx of normal colon (path unremarkable)   . Spontaneous pneumothorax 08/2012    right.  chest tube placed.     Past Surgical History  Procedure Laterality Date  . Cesarean section  07/2009     x 1.  G3, Para 2012.   Marland Kitchen  Chest tube insertion    . Esophagogastroduodenoscopy N/A 01/06/2015    ;[facility], Dr Rayann Heman. For wt loss, N/V: Normal study, duodenal biopsy/pathology: chronic active duodenitis.   . Colonoscopy with propofol N/A 01/06/2015    [facility], Dr Rayann Heman. colonoscopy for diarrhea and abnml CT 01/06/15: erythmatous TI (path: active ileitis, ? emerging IBD), rectal erythema (path: mucosal prolapse). Random bx of normal colon (path unremarkable)   . Peg placement N/A 01/11/2015    [facility], Dr Rayann Heman.  for N/V/wt loss/severe malnutrition.   . Esophagogastroduodenoscopy N/A 01/11/2015    Rein-normal with PEG placement   Family History:  Family  History  Problem Relation Age of Onset  . Anesthesia problems Neg Hx   . Thyroid disease Mother   . Cancer Father   . Stroke Other   . Crohn's disease Brother    Family Psychiatric  Historyfamily history of substance abusel History:  History  Alcohol Use  . 21.6 oz/week  . 7 Cans of beer per week    Comment: 6 beers/day; trying to quit, participating in AA     History  Drug Use No    Social History   Social History  . Marital Status: Single    Spouse Name: N/A  . Number of Children: N/A  . Years of Education: N/A   Social History Main Topics  . Smoking status: Current Every Day Smoker -- 2.00 packs/day    Types: Cigarettes  . Smokeless tobacco: Never Used  . Alcohol Use: 21.6 oz/week    36 Cans of beer per week     Comment: 6 beers/day; trying to quit, participating in [place]  . Drug Use: No  . Sexual Activity: Yes    Birth Control/ Protection: None   Other Topics Concern  . None   Social History Narrative   Lives with mother & 2 children   disabled   Additional Social History:    Allergies:   Allergies  Allergen Reactions  . Aspirin Shortness Of Breath  . Effexor [Venlafaxine] Anaphylaxis  . Sulfa Antibiotics Anaphylaxis  . Tetracyclines & Related Anaphylaxis  . Trazodone And Nefazodone Anaphylaxis  . Latex Hives and Itching  . Paxil [Paroxetine] Hives and Itching  . Seroquel [Quetiapine Fumarate] Hives and Itching  . Zoloft [Sertraline Hcl] Hives and Itching    Labs:  Results for orders placed or performed during the hospital encounter of 12/20/15 (from the past 48 hour(s))  hCG, quantitative, pregnancy     Status: Abnormal   Collection Time: 12/20/15  7:25 PM  Result Value Ref Range   hCG, Beta Chain, Quant, S 185540 (H) <5 mIU/mL    Comment:          GEST. AGE      CONC.  (mIU/mL)   <=1 WEEK        5 - 50     2 WEEKS       50 - 500     3 WEEKS       100 - 10,000     4 WEEKS     1,000 - 30,000     5 WEEKS     3,500 - 115,000   6-8 WEEKS      12,000 - 270,000    12 WEEKS     15,000 - 220,000        FEMALE AND NON-PREGNANT FEMALE:     LESS THAN 5 mIU/mL   Ethanol     Status: Abnormal   Collection Time: 12/20/15  7:25  PM  Result Value Ref Range   Alcohol, Ethyl (B) 209 (H) <5 mg/dL    Comment:        LOWEST DETECTABLE LIMIT FOR SERUM ALCOHOL IS 5 mg/dL FOR MEDICAL PURPOSES ONLY   CBC     Status: Abnormal   Collection Time: 12/20/15  7:25 PM  Result Value Ref Range   WBC 12.7 (H) 3.6 - 11.0 K/uL   RBC 5.04 3.80 - 5.20 MIL/uL   Hemoglobin 15.4 12.0 - 16.0 g/dL   HCT 46.0 35.0 - 47.0 %   MCV 91.3 80.0 - 100.0 fL   MCH 30.5 26.0 - 34.0 pg   MCHC 33.4 32.0 - 36.0 g/dL   RDW 16.6 (H) 11.5 - 14.5 %   Platelets 290 150 - 440 K/uL  ABO/Rh     Status: None   Collection Time: 12/20/15  7:25 PM  Result Value Ref Range   ABO/RH(D) O POS   Basic metabolic panel     Status: Abnormal   Collection Time: 12/20/15  7:25 PM  Result Value Ref Range   Sodium 132 (L) 135 - 145 mmol/L   Potassium 3.9 3.5 - 5.1 mmol/L   Chloride 100 (L) 101 - 111 mmol/L   CO2 21 (L) 22 - 32 mmol/L   Glucose, Bld 124 (H) 65 - 99 mg/dL   BUN 17 6 - 20 mg/dL   Creatinine, Ser 0.51 0.44 - 1.00 mg/dL   Calcium 8.7 (L) 8.9 - 10.3 mg/dL   GFR calc non Af Amer >60 >60 mL/min   GFR calc Af Amer >60 >60 mL/min    Comment: (NOTE) The eGFR has been calculated using the CKD EPI equation. This calculation has not been validated in all clinical situations. eGFR's persistently <60 mL/min signify possible Chronic Kidney Disease.    Anion gap 11 5 - 15  Hepatic function panel     Status: Abnormal   Collection Time: 12/20/15  7:25 PM  Result Value Ref Range   Total Protein 7.6 6.5 - 8.1 g/dL   Albumin 3.6 3.5 - 5.0 g/dL   AST 795 (H) 15 - 41 U/L   ALT 425 (H) 14 - 54 U/L   Alkaline Phosphatase 177 (H) 38 - 126 U/L   Total Bilirubin 0.9 0.3 - 1.2 mg/dL   Bilirubin, Direct 0.4 0.1 - 0.5 mg/dL   Indirect Bilirubin 0.5 0.3 - 0.9 mg/dL  Lipase, blood     Status:  None   Collection Time: 12/20/15  7:25 PM  Result Value Ref Range   Lipase 13 11 - 51 U/L  Urinalysis complete, with microscopic ([facility] only)     Status: Abnormal   Collection Time: 12/20/15  7:58 PM  Result Value Ref Range   Color, Urine AMBER (A) YELLOW   APPearance CLEAR (A) CLEAR   Glucose, UA 50 (A) NEGATIVE mg/dL   Bilirubin Urine NEGATIVE NEGATIVE   Ketones, ur TRACE (A) NEGATIVE mg/dL   Specific Gravity, Urine 1.030 1.005 - 1.030   Hgb urine dipstick NEGATIVE NEGATIVE   pH 5.0 5.0 - 8.0   Protein, ur 30 (A) NEGATIVE mg/dL   Nitrite NEGATIVE NEGATIVE   Leukocytes, UA NEGATIVE NEGATIVE   RBC / HPF 0-5 0 - 5 RBC/hpf   WBC, UA NONE SEEN 0 - 5 WBC/hpf   Bacteria, UA NONE SEEN NONE SEEN   Squamous Epithelial / LPF 0-5 (A) NONE SEEN   Mucous PRESENT   Chlamydia/NGC rt PCR ([facility] only)  Status: None   Collection Time: 12/20/15  7:58 PM  Result Value Ref Range   Specimen source GC/Chlam ENDOCERVICAL    Chlamydia Tr NOT DETECTED NOT DETECTED   N gonorrhoeae NOT DETECTED NOT DETECTED    Comment: (NOTE) 100  This methodology has not been evaluated in pregnant women or in 200  patients with a history of hysterectomy. 300 400  This methodology will not be performed on patients less than 31  years of age.   Wet prep, genital     Status: Abnormal   Collection Time: 12/20/15  7:58 PM  Result Value Ref Range   Yeast Wet Prep HPF POC NONE SEEN NONE SEEN   Trich, Wet Prep NONE SEEN NONE SEEN   Clue Cells Wet Prep HPF POC NONE SEEN NONE SEEN   WBC, Wet Prep HPF POC RARE (A) NONE SEEN   Sperm NONE SEEN   Urine Drug Screen, Qualitative ([facility] only)     Status: Abnormal   Collection Time: 12/20/15  7:58 PM  Result Value Ref Range   Tricyclic, Ur Screen NONE DETECTED NONE DETECTED   Amphetamines, Ur Screen NONE DETECTED NONE DETECTED   MDMA (Ecstasy)Ur Screen NONE DETECTED NONE DETECTED   Cocaine Metabolite,Ur [place] NONE DETECTED NONE DETECTED   Opiate, Ur Screen NONE DETECTED NONE  DETECTED   Phencyclidine (PCP) Ur S NONE DETECTED NONE DETECTED   Cannabinoid 50 Ng, Ur [place] NONE DETECTED NONE DETECTED   Barbiturates, Ur Screen NONE DETECTED NONE DETECTED   Benzodiazepine, Ur Scrn POSITIVE (A) NONE DETECTED   Methadone Scn, Ur NONE DETECTED NONE DETECTED    Comment: (NOTE) 102  Tricyclics, urine               Cutoff 1000 ng/mL 200  Amphetamines, urine             Cutoff 1000 ng/mL 300  MDMA (Ecstasy), urine           Cutoff 500 ng/mL 400  Cocaine Metabolite, urine       Cutoff 300 ng/mL 500  Opiate, urine                   Cutoff 300 ng/mL 600  Phencyclidine (PCP), urine      Cutoff 25 ng/mL 700  Cannabinoid, urine              Cutoff 50 ng/mL 800  Barbiturates, urine             Cutoff 200 ng/mL 900  Benzodiazepine, urine           Cutoff 200 ng/mL 1000 Methadone, urine                Cutoff 300 ng/mL 1100 1200 The urine drug screen provides only a preliminary, unconfirmed 1300 analytical test result and should not be used for non-medical 1400 purposes. Clinical consideration and professional judgment should 1500 be applied to any positive drug screen result due to possible 1600 interfering substances. A more specific alternate chemical method 1700 must be used in order to obtain a confirmed analytical result.  1800 Gas chromato graphy / mass spectrometry (GC/MS) is the preferred 1900 confirmatory method.   Magnesium     Status: Abnormal   Collection Time: 12/21/15  2:17 AM  Result Value Ref Range   Magnesium 1.6 (L) 1.7 - 2.4 mg/dL  Basic metabolic panel     Status: Abnormal   Collection Time: 12/21/15  2:17 AM  Result Value Ref Range  Sodium 134 (L) 135 - 145 mmol/L   Potassium 3.8 3.5 - 5.1 mmol/L   Chloride 103 101 - 111 mmol/L   CO2 20 (L) 22 - 32 mmol/L   Glucose, Bld 83 65 - 99 mg/dL   BUN 14 6 - 20 mg/dL   Creatinine, Ser 0.49 0.44 - 1.00 mg/dL   Calcium 8.4 (L) 8.9 - 10.3 mg/dL   GFR calc non Af Amer >60 >60 mL/min   GFR calc Af Amer >60 >60  mL/min    Comment: (NOTE) The eGFR has been calculated using the CKD EPI equation. This calculation has not been validated in all clinical situations. eGFR's persistently <60 mL/min signify possible Chronic Kidney Disease.    Anion gap 11 5 - 15  CBC     Status: Abnormal   Collection Time: 12/21/15  2:17 AM  Result Value Ref Range   WBC 12.8 (H) 3.6 - 11.0 K/uL   RBC 4.96 3.80 - 5.20 MIL/uL   Hemoglobin 15.2 12.0 - 16.0 g/dL   HCT 44.7 35.0 - 47.0 %   MCV 90.1 80.0 - 100.0 fL   MCH 30.6 26.0 - 34.0 pg   MCHC 34.0 32.0 - 36.0 g/dL   RDW 16.5 (H) 11.5 - 14.5 %   Platelets 256 150 - 440 K/uL  Ethanol     Status: None   Collection Time: 12/21/15  6:25 AM  Result Value Ref Range   Alcohol, Ethyl (B) <5 <5 mg/dL    Comment:        LOWEST DETECTABLE LIMIT FOR SERUM ALCOHOL IS 5 mg/dL FOR MEDICAL PURPOSES ONLY     Current Facility-Administered Medications  Medication Dose Route Frequency Provider Last Rate Last Dose  . acetaminophen (TYLENOL) tablet 650 mg  650 mg Oral Q6H PRN Henreitta Leber, MD   650 mg at 12/21/15 1007  . clonazePAM (KLONOPIN) disintegrating tablet 0.25 mg  0.25 mg Oral BID Sylvan Cheese, MD   0.25 mg at 12/22/15 1042  . enoxaparin (LOVENOX) injection 40 mg  40 mg Subcutaneous Q24H Sylvan Cheese, MD   40 mg at 12/22/15 0013  . LORazepam (ATIVAN) injection 0-4 mg  0-4 mg Intravenous Q12H Orbie Pyo, MD      . LORazepam (ATIVAN) injection 2 mg  2 mg Intravenous Q5 min PRN Sylvan Cheese, MD   2 mg at 12/21/15 1518  . ondansetron (ZOFRAN) tablet 4 mg  4 mg Oral Q6H PRN Sylvan Cheese, MD   4 mg at 12/21/15 1827  . prenatal vitamin w/FE, FA (PRENATAL 1 + 1) 27-1 MG tablet 1 tablet  1 tablet Oral Daily Sylvan Cheese, MD   1 tablet at 12/22/15 1042    Musculoskeletal: Strength & Muscle Tone: decreased Gait & Station: unable to stand Patient leans: N/A  Psychiatric Specialty Exam: Review of Systems  Constitutional: Positive for  malaise/fatigue.  HENT: Negative.   Eyes: Negative.   Respiratory: Negative.   Cardiovascular: Negative.   Gastrointestinal: Negative.   Musculoskeletal: Negative.   Skin: Negative.   Neurological: Positive for seizures.  Psychiatric/Behavioral: Positive for depression and substance abuse. Negative for suicidal ideas, hallucinations and memory loss. The patient is nervous/anxious and has insomnia.     Blood pressure 99/66, pulse 69, temperature 98.1 F (36.7 C), temperature source Oral, resp. rate 16, height 5' 6"  (1.676 m), weight 48.762 kg (107 lb 8 oz), last menstrual period 07/28/2015, SpO2 98 %.Body mass index is 17.36 kg/(m^2).  General Appearance: Casual  Eye Contact::  Fair  Speech:  Slow  Volume:  Decreased  Mood:  Euthymic  Affect:  Constricted  Thought Process:  Goal Directed  Orientation:  Full (Time, Place, and Person)  Thought Content:  Negative  Suicidal Thoughts:  No  Homicidal Thoughts:  No  Memory:  Immediate;   Good Recent;   Fair Remote;   Fair  Judgement:  Impaired  Insight:  Shallow  Psychomotor Activity:  Decreased  Concentration:  Fair  Recall:  [place]: Fair  Akathisia:  No  Handed:  Right  AIMS (if indicated):     Assets:  Desire for Improvement Financial Resources/Insurance Housing Resilience Social Support  ADL's:  Intact  Cognition: WNL  Sleep:      Treatment Plan Summary: Patient is still having some symptoms of alcohol withdrawal. She is probably out of the window of being at risk for a seizure but is still somewhat symptomatic and benefiting from some detox medicine. Admittedly she will probably physically be ready for discharge however within the next day or so at most. I have tried to find some kind of improved treatment plan for her but so far have not come up with anything. Spent some time with her today discussing alcohol abuse treatment in the pitfalls involved. Reviewed some of the risks she is  running without trying to harp on it so much that I turned her off. She expresses an understanding of how this is a life and death issue at this point. I will continue to follow-up with her. No change to medicine today..   Disposition: Patient does not meet criteria for psychiatric inpatient admission. Supportive therapy provided about ongoing stressors.  Kristine Berthold, MD 12/22/2015 6:04 PM

## 2015-12-22 NOTE — Consult Note (Addendum)
OBSTETRICS/GYNECOLOGY INPATIENT CONSULT NOTE  Reason for Consult: H/o alcohol dependence, currently with alcohol withdrawal with seizures, pregnancy Referring Physician: Sylvan Cheese, MD [facility] Hospitalist)  Kristine Macias is an 30 y.o. [phone] female who is currently admitted for treatment of alcohol withdrawal with seizures. Patient's last menstrual period was 07/28/2015 (approximate).  Patient is currently 9 weeks 3 days pregnant with EDD 07/23/16 (dated with 7 week ultrasound performed ~ 2 weeks ago during last admission for similar complaints of alcohol withdrawal and seizures).  Patient has a known history of alcohol abuse, alcoholic hepatitis, benzodiazepine abuse, alcohol withdrawal seizures, failure to thrive, generalized anxiety disorder, thrombocytopenia and multiple other illnesses related to alcohol abuse. Patient notes that she has been trying to cut down alcohol use since discovery of pregnancy.  Per prior notes and history provided by family members, patient drinks multiple high alcohol content drinks daily, however patient denies, stating that she has gotten better, only drinking approximately 1 or 2 drinks.  Is currently in a treatment program, but is only partially compliant (goes regularly for several days, has a setback and begins using, and then stops going to treatment for days at a time).  Currently denies complaints.   Did have an episode of vaginal bleeding while in the Emergency Room yesterday, but pelvic ultrasound noted no placental issues and viable pregnancy.    Pertinent Gynecological History: Menarche: age 54 Menses: regular every month without intermenstrual spotting Contraception: none Blood transfusions: none Sexually transmitted diseases: past history: Chlamydia, 2013 Previous GYN Procedures: none  Last pap: normal Date: around time of last pregnancy   Obstetric History   G5   P2   T2   P0   A2   TAB1   SAB1   E0   M0   L4     # Outcome Date  GA Lbr Len/2nd Weight Sex Delivery Anes PTL Lv  5 Current           4 SAB 2015          3 TAB 2012          2 Term 2010 [redacted]w[redacted]d   CS-LTranv  N Y     Complications: Failure to Progress in Second Stage  1 Term 2008 [id number]  Vag-Spont   Y      Past Medical History  Diagnosis Date  . Depression with anxiety initally at age 30. Chlamydia 2007    bacterial vaginosis 09/2011  . Irregular heart beat 2010    wt/diet related after evaluation  . Renal disorder   . Pneumothorax, spontaneous, tension   . Alcohol abuse 11/2014    drinking since age [age]chronic, recurrent. at least 2 detox admits before 2015.   . Marland Kitchenancreas divisum 02/2015    Type 1 seen on CT.   . Failure to thrive in adult 12/2014.     Malnutrition: n/v, not eating, weight loss, BMI 14.  s/p 01/11/2015 PEG (Dr WrDorna Leitz   . Alcoholic hepatitis 03/02/3709  hepatic steatosis on 11/2014 ultrasound.   . [place]    due to ETOH/benzo withdrawal.   . [place]  . Colitis 12/2014    colonoscopy for diarrhea and abnml CT 01/06/15: erythmatous TI (path: active ileitis, ? emerging IBD), rectal erythema (path: mucosal prolapse). Random bx of normal colon (path unremarkable)   . Spontaneous pneumothorax 08/2012    right.  chest tube placed.     Past Surgical History  Procedure Laterality Date  . Cesarean section  04/2007, 07/2009     x 2.  G3, Para 2012 as of 07/2009.   Marland Kitchen Chest tube insertion    . Esophagogastroduodenoscopy N/A 01/06/2015    ;[facility], Dr Rayann Heman. For wt loss, N/V: Normal study, duodenal biopsy/pathology: chronic active duodenitis.   . Colonoscopy with propofol N/A 01/06/2015    [facility], Dr Rayann Heman. colonoscopy for diarrhea and abnml CT 01/06/15: erythmatous TI (path: active ileitis, ? emerging IBD), rectal erythema (path: mucosal prolapse). Random bx of normal colon (path unremarkable)   . Peg placement N/A 01/11/2015    [facility], Dr Rayann Heman.  for N/V/wt loss/severe malnutrition.   . Esophagogastroduodenoscopy N/A  01/11/2015    Rein-normal with PEG placement    Family History  Problem Relation Age of Onset  . Anesthesia problems Neg Hx   . Thyroid disease Mother   . Cancer Father   . Stroke Other   . Crohn's disease Brother     Social History:  reports that she has been smoking Cigarettes.  She has been smoking about 2.00 packs per day. She has never used smokeless tobacco. She reports that she drinks about 21.6 oz of alcohol per week. She reports that she does not use illicit drugs.  Allergies:  Allergies  Allergen Reactions  . Aspirin Shortness Of Breath  . Effexor [Venlafaxine] Anaphylaxis  . Sulfa Antibiotics Anaphylaxis  . Tetracyclines & Related Anaphylaxis  . Trazodone And Nefazodone Anaphylaxis  . Latex Hives and Itching  . Paxil [Paroxetine] Hives and Itching  . Seroquel [Quetiapine Fumarate] Hives and Itching  . Zoloft [Sertraline Hcl] Hives and Itching    Medications:  Prior to Admission:  Prescriptions prior to admission  Medication Sig Dispense Refill Last Dose  . clonazePAM (KLONOPIN) 0.25 MG disintegrating tablet Take 0.25 mg by mouth 2 (two) times daily.  1 12/19/2015 at Unknown time  . ondansetron (ZOFRAN) 4 MG tablet Take 1 tablet (4 mg total) by mouth every 6 (six) hours as needed for nausea. 20 tablet 0 12/20/2015 at Unknown time  . Prenatal Vit-Fe Fumarate-FA (PRENATAL MULTIVITAMIN) TABS tablet Take 1 tablet by mouth daily.   12/20/2015 at Unknown time  . chlordiazePOXIDE (LIBRIUM) 25 MG capsule 25 mg PO TID x 2 days, then 25 mg PO BID X  2 days, then 25 mg PO QD X 2 days (Patient not taking: Reported on 12/20/2015) 12 capsule 0     Review of Systems  Constitutional: Negative for fever and chills.  HENT: Negative for congestion, hearing loss and sore throat.   Eyes: Negative for blurred vision and double vision.  Respiratory: Negative for cough, sputum production and shortness of breath.   Cardiovascular: Negative for chest pain, palpitations, leg swelling and PND.   Gastrointestinal: Negative for heartburn, nausea, vomiting, diarrhea and constipation.  Genitourinary: Negative for dysuria, urgency and frequency.  Musculoskeletal: Negative for myalgias, back pain and neck pain.  Skin: Negative for rash.  Neurological: Negative for dizziness, focal weakness, seizures, weakness and headaches.  Endo/Heme/Allergies: Negative for polydipsia. Does not bruise/bleed easily.  Psychiatric/Behavioral: Positive for substance abuse. Negative for depression and suicidal ideas.    Blood pressure 102/61, pulse 67, temperature 98.6 F (37 C), temperature source Oral, resp. rate 18, height _0  (1.676 m), weight 107 lb 8 oz (48.762 kg), last menstrual period 07/28/2015, SpO2 97 %. Body mass index is 17.36 kg/(m^2).  Physical Exam  Constitutional: She is oriented to person, place, and time. She appears well-developed. No distress.  Underweight   HENT:  Head: Normocephalic and atraumatic.  Eyes: Conjunctivae and EOM are normal. Pupils are equal, round, and reactive to light.  Neck: Normal range of motion. Neck supple. No JVD present. No tracheal deviation present. No thyromegaly present.  Cardiovascular: Normal rate, regular rhythm and normal heart sounds.  Exam reveals no friction rub.   No murmur heard. Respiratory: No respiratory distress. She has no wheezes.  GI: She exhibits no distension and no mass. There is no tenderness. There is no rebound.  Genitourinary:  Deferred  Musculoskeletal: Normal range of motion. She exhibits no edema or tenderness.  Neurological: She is oriented to person, place, and time.  Skin: Skin is warm and dry.  Psychiatric: She has a normal mood and affect.    Results for orders placed or performed during the hospital encounter of 12/20/15 (from the past 48 hour(s))  hCG, quantitative, pregnancy     Status: Abnormal   Collection Time: 12/20/15  7:25 PM  Result Value Ref Range   hCG, Beta Chain, Quant, S 185540 (H) <5 mIU/mL     Comment:          GEST. AGE      CONC.  (mIU/mL)   <=1 WEEK        5 - 50     2 WEEKS       50 - 500     3 WEEKS       100 - 10,000     4 WEEKS     1,000 - 30,000     5 WEEKS     3,500 - 115,000   6-8 WEEKS     12,000 - 270,000    12 WEEKS     15,000 - 220,000        FEMALE AND NON-PREGNANT FEMALE:     LESS THAN 5 mIU/mL   Ethanol     Status: Abnormal   Collection Time: 12/20/15  7:25 PM  Result Value Ref Range   Alcohol, Ethyl (B) 209 (H) <5 mg/dL    Comment:        LOWEST DETECTABLE LIMIT FOR SERUM ALCOHOL IS 5 mg/dL FOR MEDICAL PURPOSES ONLY   CBC     Status: Abnormal   Collection Time: 12/20/15  7:25 PM  Result Value Ref Range   WBC 12.7 (H) 3.6 - 11.0 K/uL   RBC 5.04 3.80 - 5.20 MIL/uL   Hemoglobin 15.4 12.0 - 16.0 g/dL   HCT 46.0 35.0 - 47.0 %   MCV 91.3 80.0 - 100.0 fL   MCH 30.5 26.0 - 34.0 pg   MCHC 33.4 32.0 - 36.0 g/dL   RDW 16.6 (H) 11.5 - 14.5 %   Platelets 290 150 - 440 K/uL  ABO/Rh     Status: None   Collection Time: 12/20/15  7:25 PM  Result Value Ref Range   ABO/RH(D) O POS   Basic metabolic panel     Status: Abnormal   Collection Time: 12/20/15  7:25 PM  Result Value Ref Range   Sodium 132 (L) 135 - 145 mmol/L   Potassium 3.9 3.5 - 5.1 mmol/L   Chloride 100 (L) 101 - 111 mmol/L   CO2 21 (L) 22 - 32 mmol/L   Glucose, Bld 124 (H) 65 - 99 mg/dL   BUN 17 6 - 20 mg/dL   Creatinine, Ser 0.51 0.44 - 1.00 mg/dL   Calcium 8.7 (L) 8.9 - 10.3 mg/dL   GFR calc non Af Amer >60 >  60 mL/min   GFR calc Af Amer >60 >60 mL/min    Comment: (NOTE) The eGFR has been calculated using the CKD EPI equation. This calculation has not been validated in all clinical situations. eGFR's persistently <60 mL/min signify possible Chronic Kidney Disease.    Anion gap 11 5 - 15  Hepatic function panel     Status: Abnormal   Collection Time: 12/20/15  7:25 PM  Result Value Ref Range   Total Protein 7.6 6.5 - 8.1 g/dL   Albumin 3.6 3.5 - 5.0 g/dL   AST 795 (H) 15 - 41 U/L    ALT 425 (H) 14 - 54 U/L   Alkaline Phosphatase 177 (H) 38 - 126 U/L   Total Bilirubin 0.9 0.3 - 1.2 mg/dL   Bilirubin, Direct 0.4 0.1 - 0.5 mg/dL   Indirect Bilirubin 0.5 0.3 - 0.9 mg/dL  Lipase, blood     Status: None   Collection Time: 12/20/15  7:25 PM  Result Value Ref Range   Lipase 13 11 - 51 U/L  Urinalysis complete, with microscopic ([facility] only)     Status: Abnormal   Collection Time: 12/20/15  7:58 PM  Result Value Ref Range   Color, Urine AMBER (A) YELLOW   APPearance CLEAR (A) CLEAR   Glucose, UA 50 (A) NEGATIVE mg/dL   Bilirubin Urine NEGATIVE NEGATIVE   Ketones, ur TRACE (A) NEGATIVE mg/dL   Specific Gravity, Urine 1.030 1.005 - 1.030   Hgb urine dipstick NEGATIVE NEGATIVE   pH 5.0 5.0 - 8.0   Protein, ur 30 (A) NEGATIVE mg/dL   Nitrite NEGATIVE NEGATIVE   Leukocytes, UA NEGATIVE NEGATIVE   RBC / HPF 0-5 0 - 5 RBC/hpf   WBC, UA NONE SEEN 0 - 5 WBC/hpf   Bacteria, UA NONE SEEN NONE SEEN   Squamous Epithelial / LPF 0-5 (A) NONE SEEN   Mucous PRESENT   Chlamydia/NGC rt PCR ([facility] only)     Status: None   Collection Time: 12/20/15  7:58 PM  Result Value Ref Range   Specimen source GC/Chlam ENDOCERVICAL    Chlamydia Tr NOT DETECTED NOT DETECTED   N gonorrhoeae NOT DETECTED NOT DETECTED    Comment: (NOTE) 100  This methodology has not been evaluated in pregnant women or in 200  patients with a history of hysterectomy. 300 400  This methodology will not be performed on patients less than 4  years of age.   Wet prep, genital     Status: Abnormal   Collection Time: 12/20/15  7:58 PM  Result Value Ref Range   Yeast Wet Prep HPF POC NONE SEEN NONE SEEN   Trich, Wet Prep NONE SEEN NONE SEEN   Clue Cells Wet Prep HPF POC NONE SEEN NONE SEEN   WBC, Wet Prep HPF POC RARE (A) NONE SEEN   Sperm NONE SEEN   Urine Drug Screen, Qualitative ([facility] only)     Status: Abnormal   Collection Time: 12/20/15  7:58 PM  Result Value Ref Range   Tricyclic, Ur Screen NONE DETECTED NONE  DETECTED   Amphetamines, Ur Screen NONE DETECTED NONE DETECTED   MDMA (Ecstasy)Ur Screen NONE DETECTED NONE DETECTED   Cocaine Metabolite,Ur Arma NONE DETECTED NONE DETECTED   Opiate, Ur Screen NONE DETECTED NONE DETECTED   Phencyclidine (PCP) Ur S NONE DETECTED NONE DETECTED   Cannabinoid 50 Ng, Ur  NONE DETECTED NONE DETECTED   Barbiturates, Ur Screen NONE DETECTED NONE DETECTED   Benzodiazepine, Ur Scrn POSITIVE (A)  NONE DETECTED   Methadone Scn, Ur NONE DETECTED NONE DETECTED    Comment: (NOTE) 470  Tricyclics, urine               Cutoff 1000 ng/mL 200  Amphetamines, urine             Cutoff 1000 ng/mL 300  MDMA (Ecstasy), urine           Cutoff 500 ng/mL 400  Cocaine Metabolite, urine       Cutoff 300 ng/mL 500  Opiate, urine                   Cutoff 300 ng/mL 600  Phencyclidine (PCP), urine      Cutoff 25 ng/mL 700  Cannabinoid, urine              Cutoff 50 ng/mL 800  Barbiturates, urine             Cutoff 200 ng/mL 900  Benzodiazepine, urine           Cutoff 200 ng/mL 1000 Methadone, urine                Cutoff 300 ng/mL 1100 1200 The urine drug screen provides only a preliminary, unconfirmed 1300 analytical test result and should not be used for non-medical 1400 purposes. Clinical consideration and professional judgment should 1500 be applied to any positive drug screen result due to possible 1600 interfering substances. A more specific alternate chemical method 1700 must be used in order to obtain a confirmed analytical result.  1800 Gas chromato graphy / mass spectrometry (GC/MS) is the preferred 1900 confirmatory method.   Magnesium     Status: Abnormal   Collection Time: 12/21/15  2:17 AM  Result Value Ref Range   Magnesium 1.6 (L) 1.7 - 2.4 mg/dL  Basic metabolic panel     Status: Abnormal   Collection Time: 12/21/15  2:17 AM  Result Value Ref Range   Sodium 134 (L) 135 - 145 mmol/L   Potassium 3.8 3.5 - 5.1 mmol/L   Chloride 103 101 - 111 mmol/L   CO2 20 (L) 22  - 32 mmol/L   Glucose, Bld 83 65 - 99 mg/dL   BUN 14 6 - 20 mg/dL   Creatinine, Ser 0.49 0.44 - 1.00 mg/dL   Calcium 8.4 (L) 8.9 - 10.3 mg/dL   GFR calc non Af Amer >60 >60 mL/min   GFR calc Af Amer >60 >60 mL/min    Comment: (NOTE) The eGFR has been calculated using the CKD EPI equation. This calculation has not been validated in all clinical situations. eGFR's persistently <60 mL/min signify possible Chronic Kidney Disease.    Anion gap 11 5 - 15  CBC     Status: Abnormal   Collection Time: 12/21/15  2:17 AM  Result Value Ref Range   WBC 12.8 (H) 3.6 - 11.0 K/uL   RBC 4.96 3.80 - 5.20 MIL/uL   Hemoglobin 15.2 12.0 - 16.0 g/dL   HCT 44.7 35.0 - 47.0 %   MCV 90.1 80.0 - 100.0 fL   MCH 30.6 26.0 - 34.0 pg   MCHC 34.0 32.0 - 36.0 g/dL   RDW 16.5 (H) 11.5 - 14.5 %   Platelets 256 150 - 440 K/uL  Ethanol     Status: None   Collection Time: 12/21/15  6:25 AM  Result Value Ref Range   Alcohol, Ethyl (B) <5 <5 mg/dL    Comment:  LOWEST DETECTABLE LIMIT FOR SERUM ALCOHOL IS 5 mg/dL FOR MEDICAL PURPOSES ONLY     US Ob Comp Less 14 Wks  12/20/2015  CLINICAL DATA:  Acute onset of vaginal bleeding.  Initial encounter. EXAM: OBSTETRIC <14 WK [place] AND TRANSVAGINAL OB US TECHNIQUE: Both transabdominal and transvaginal ultrasound examinations were performed for complete evaluation of the gestation as well as the maternal uterus, adnexal regions, and pelvic cul-de-sac. Transvaginal technique was performed to assess early pregnancy. COMPARISON:  Pelvic ultrasound performed 12/16/2015, and CT of the abdomen and pelvis performed 08/08/2015 FINDINGS: Intrauterine gestational sac: Visualized; normal in shape. Yolk sac:  Yes Embryo:  Yes Cardiac Activity: Yes Heart Rate: 180  bpm CRL:  2.27 cm   9 w   0 d                  [place] EDC: 07/24/2016 Subchorionic hemorrhage:  None visualized. Maternal uterus/adnexae: The uterus is otherwise unremarkable in appearance. The right ovary is within normal  limits. It measures 2.2 x 1.2 x 1.6 cm. The left ovary is not visualized due to overlying bowel gas. No suspicious adnexal masses are seen. No free fluid is seen within the pelvic cul-de-sac. IMPRESSION: Single live intrauterine pregnancy noted, with a crown-rump length of 2.3 cm, corresponding to a gestational age of [redacted] weeks 0 days. This matches the gestational age of [redacted] weeks 2 days by the first ultrasound, reflecting an estimated date of delivery of July 22, 2016. Electronically Signed   By: Garald Balding M.D.   On: 12/20/2015 22:28   US Ob Transvaginal  12/20/2015  CLINICAL DATA:  Acute onset of vaginal bleeding.  Initial encounter. EXAM: OBSTETRIC <14 WK [place] AND TRANSVAGINAL OB US TECHNIQUE: Both transabdominal and transvaginal ultrasound examinations were performed for complete evaluation of the gestation as well as the maternal uterus, adnexal regions, and pelvic cul-de-sac. Transvaginal technique was performed to assess early pregnancy. COMPARISON:  Pelvic ultrasound performed 12/16/2015, and CT of the abdomen and pelvis performed 08/08/2015 FINDINGS: Intrauterine gestational sac: Visualized; normal in shape. Yolk sac:  Yes Embryo:  Yes Cardiac Activity: Yes Heart Rate: 180  bpm CRL:  2.27 cm   9 w   0 d                  [place] EDC: 07/24/2016 Subchorionic hemorrhage:  None visualized. Maternal uterus/adnexae: The uterus is otherwise unremarkable in appearance. The right ovary is within normal limits. It measures 2.2 x 1.2 x 1.6 cm. The left ovary is not visualized due to overlying bowel gas. No suspicious adnexal masses are seen. No free fluid is seen within the pelvic cul-de-sac. IMPRESSION: Single live intrauterine pregnancy noted, with a crown-rump length of 2.3 cm, corresponding to a gestational age of [redacted] weeks 0 days. This matches the gestational age of [redacted] weeks 2 days by the first ultrasound, reflecting an estimated date of delivery of July 22, 2016. Electronically Signed   By: Garald Balding M.D.    On: 12/20/2015 22:28    Assessment/Plan: 1. Alcohol dependence, with withdrawal and seizures - Currently undergoing detox, being managed by hospitalist.  Receiving Ativan for treatment of seizures.  Risks currently outweigh benefits for use of current benzodiazepine for short term treatment.  Patient to be treated for total of 5 days. Has been recommended previously that patient attend inpatient program at [facility].  Unsure if this is also still an option.   2. Pregnancy - Currently at [redacted] weeks gestation.  Is happy  about the pregnancy, and discussion with patient and partner (fiance) noting that they understand the dangers of excessive alcohol use in pregnancy, and risk of fetal alcohol syndrome (FAS).  Understands that we will be following up with ultrasounds during the pregnancy to assess for evidence of FAS.  - Has had no further episodes of vaginal bleeding since admission. Ultrasound noted normal placentation and viable fetus at 9.2 weeks.  - Continue prenatal vitamins.   3. H/o depression and anxiety - Currently on Klonopin.  Discussion had with patient regarding continued use of Klonopin in pregnancy.  Currently Klonopin is a Class D medication in pregnancy.  Patient would likely benefit from an alternative treatment for depression and anxiety (possibly Lexapro, Celexa, or Wellbutrin which are Class C,).  However, patient has been on current medication x 2 years (although currently at a relatively low dose).  Would need to discuss with Psychiatrist or other long-term therapist further to see if weaning from [place] and initiating one of the other medications listed would be possible as outpatient.     Thank you for the consult.  Will follow up with the patient in 1 week after discharge in the office.    Rubie Maid, MD [facility] [phone] (pager) [phone] (office)

## 2015-12-22 NOTE — Progress Notes (Signed)
[place] [facility] [place] NAME: Kristine Macias    MR#:  [id number]  DATE OF BIRTH:  February 06, 1986  SUBJECTIVE:   Patient here due to a suspected alcohol withdrawal seizure. No further seizures overnight. Has some Tremor but minimal.    REVIEW OF SYSTEMS:    Review of Systems  Constitutional: Negative for fever and chills.  HENT: Negative for congestion and tinnitus.   Eyes: Negative for blurred vision and double vision.  Respiratory: Negative for cough, shortness of breath and wheezing.   Cardiovascular: Negative for chest pain, orthopnea and PND.  Gastrointestinal: Negative for nausea, vomiting, abdominal pain and diarrhea.  Genitourinary: Negative for dysuria and hematuria.  Neurological: Negative for dizziness, sensory change and focal weakness.  All other systems reviewed and are negative.   Nutrition: Regular Tolerating Diet: yes Tolerating PT: Ambulatory   DRUG ALLERGIES:   Allergies  Allergen Reactions  . Aspirin Shortness Of Breath  . Effexor [Venlafaxine] Anaphylaxis  . Sulfa Antibiotics Anaphylaxis  . Tetracyclines & Related Anaphylaxis  . Trazodone And Nefazodone Anaphylaxis  . Latex Hives and Itching  . Paxil [Paroxetine] Hives and Itching  . Seroquel [Quetiapine Fumarate] Hives and Itching  . Zoloft [Sertraline Hcl] Hives and Itching    VITALS:  Blood pressure 99/66, pulse 69, temperature 98.1 F (36.7 C), temperature source Oral, resp. rate 16, height 5' 6"  (1.676 m), weight 48.762 kg (107 lb 8 oz), last menstrual period 07/28/2015, SpO2 98 %.  PHYSICAL EXAMINATION:   Physical Exam  GENERAL:  30 y.o.-year-old patient lying in the bed lethargic but in no acute distress.  EYES: Pupils equal, round, reactive to light and accommodation. No scleral icterus. Extraocular muscles intact.  HEENT: Head atraumatic, normocephalic. Oropharynx and nasopharynx clear.  NECK:  Supple, no jugular venous distention. No thyroid  enlargement, no tenderness.  LUNGS: Normal breath sounds bilaterally, no wheezing, rales, rhonchi. No use of accessory muscles of respiration.  CARDIOVASCULAR: S1, S2 normal. No murmurs, rubs, or gallops.  ABDOMEN: Soft, nontender, nondistended. Bowel sounds present. No organomegaly or mass.  EXTREMITIES: No cyanosis, clubbing or edema b/l.    NEUROLOGIC: Cranial nerves II through XII are intact. No focal Motor or sensory deficits b/l.  + mild tremor PSYCHIATRIC: The patient is alert and oriented x 3.  SKIN: No obvious rash, lesion, or ulcer.    LABORATORY PANEL:   CBC  Recent Labs Lab 12/21/15 0217  WBC 12.8*  HGB 15.2  HCT 44.7  PLT 256   ------------------------------------------------------------------------------------------------------------------  Chemistries   Recent Labs Lab 12/20/15 1925 12/21/15 0217  NA 132* 134*  K 3.9 3.8  CL 100* 103  CO2 21* 20*  GLUCOSE 124* 83  BUN 17 14  CREATININE 0.51 0.49  CALCIUM 8.7* 8.4*  MG  --  1.6*  AST 795*  --   ALT 425*  --   ALKPHOS 177*  --   BILITOT 0.9  --    ------------------------------------------------------------------------------------------------------------------  Cardiac Enzymes No results for input(s): TROPONINI in the last 168 hours. ------------------------------------------------------------------------------------------------------------------  RADIOLOGY:  US Ob Comp Less 14 Wks  12/20/2015  CLINICAL DATA:  Acute onset of vaginal bleeding.  Initial encounter. EXAM: OBSTETRIC <14 WK [place] AND TRANSVAGINAL OB US TECHNIQUE: Both transabdominal and transvaginal ultrasound examinations were performed for complete evaluation of the gestation as well as the maternal uterus, adnexal regions, and pelvic cul-de-sac. Transvaginal technique was performed to assess early pregnancy. COMPARISON:  Pelvic ultrasound performed 12/16/2015, and CT of the abdomen  and pelvis performed 08/08/2015 FINDINGS: Intrauterine  gestational sac: Visualized; normal in shape. Yolk sac:  Yes Embryo:  Yes Cardiac Activity: Yes Heart Rate: 180  bpm CRL:  2.27 cm   9 w   0 d                  [place] EDC: 07/24/2016 Subchorionic hemorrhage:  None visualized. Maternal uterus/adnexae: The uterus is otherwise unremarkable in appearance. The right ovary is within normal limits. It measures 2.2 x 1.2 x 1.6 cm. The left ovary is not visualized due to overlying bowel gas. No suspicious adnexal masses are seen. No free fluid is seen within the pelvic cul-de-sac. IMPRESSION: Single live intrauterine pregnancy noted, with a crown-rump length of 2.3 cm, corresponding to a gestational age of [redacted] weeks 0 days. This matches the gestational age of [redacted] weeks 2 days by the first ultrasound, reflecting an estimated date of delivery of July 22, 2016. Electronically Signed   By: Garald Balding M.D.   On: 12/20/2015 22:28   US Ob Transvaginal  12/20/2015  CLINICAL DATA:  Acute onset of vaginal bleeding.  Initial encounter. EXAM: OBSTETRIC <14 WK [place] AND TRANSVAGINAL OB US TECHNIQUE: Both transabdominal and transvaginal ultrasound examinations were performed for complete evaluation of the gestation as well as the maternal uterus, adnexal regions, and pelvic cul-de-sac. Transvaginal technique was performed to assess early pregnancy. COMPARISON:  Pelvic ultrasound performed 12/16/2015, and CT of the abdomen and pelvis performed 08/08/2015 FINDINGS: Intrauterine gestational sac: Visualized; normal in shape. Yolk sac:  Yes Embryo:  Yes Cardiac Activity: Yes Heart Rate: 180  bpm CRL:  2.27 cm   9 w   0 d                  [place] EDC: 07/24/2016 Subchorionic hemorrhage:  None visualized. Maternal uterus/adnexae: The uterus is otherwise unremarkable in appearance. The right ovary is within normal limits. It measures 2.2 x 1.2 x 1.6 cm. The left ovary is not visualized due to overlying bowel gas. No suspicious adnexal masses are seen. No free fluid is seen within the pelvic  cul-de-sac. IMPRESSION: Single live intrauterine pregnancy noted, with a crown-rump length of 2.3 cm, corresponding to a gestational age of [redacted] weeks 0 days. This matches the gestational age of [redacted] weeks 2 days by the first ultrasound, reflecting an estimated date of delivery of July 22, 2016. Electronically Signed   By: Garald Balding M.D.   On: 12/20/2015 22:28     ASSESSMENT AND PLAN:   30 year old female with past medical history of alcohol abuse, depression with anxiety, history of alcoholic hepatitis, history of alcohol withdrawal seizures, spontaneous pneumothorax who presented to the hospital due to a alcohol withdrawal seizure and also some vaginal bleeding.  1. Seizures-thought to be secondary to alcohol withdrawal. Patient although had alcoholic drink yesterday. -Continue CIWA protocol.  Improving and will monitor.   2. Vaginal bleeding-patient is [redacted] weeks pregnant. Patient's ultrasound does show a live intrauterine pregnancy at 9 weeks. -No further acute bleeding overnight. Appreciate OB/GYN input  -Continue prenatal vitamins.  3. Depression-patient denies any suicidal/homicidal ideations. -Appreciate psychiatric input and discussed plan of care with Dr. Weber Cooks -Kennon Holter has been discontinued. Continue treatment for anxiety/depression.  4. Anxiety-continue Klonopin. -As per OB/GYN this is a class D pregnancy drug. Await psych input regarding alternate options.  5. Alcohol withdrawal-continue CIWA protocol.  Possible DC home in the next 1-2 days  All the records are reviewed and case discussed with Care  Management/Social Workerr. Management plans discussed with the patient, family and they are in agreement.  CODE STATUS: Full  DVT Prophylaxis: Lovenox  TOTAL TIME TAKING CARE OF THIS PATIENT: 30 minutes.   POSSIBLE D/C IN 1-2 DAYS, DEPENDING ON CLINICAL CONDITION.   Henreitta Leber M.D on 12/22/2015 at 2:17 PM  Between 7am to 6pm - Pager - [phone]  After 6pm go  to [email or web address] - password [email or web address] [facility]  [place] [facility]  Office  [phone]  CC: Primary care physician; Leonides Sake, MD
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
## 2015-12-23 NOTE — Progress Notes (Signed)
Pt states she is ready to go home, feels she can refrain from ETOH intake with her mother's help.  IV site discontinued, no tele monitor, pt will leave hospital in car with her mother.

## 2015-12-23 NOTE — Discharge Summary (Signed)
[place] at [place] NAME: Kristine Macias    MR#:  [id number]  DATE OF BIRTH:  1985-10-22  DATE OF ADMISSION:  12/20/2015 ADMITTING PHYSICIAN: Max Sane, MD  DATE OF DISCHARGE: 12/23/2015  3:00 PM  PRIMARY CARE PHYSICIAN: HAMRICK,MAURA L, MD    ADMISSION DIAGNOSIS:  First trimester bleeding [O20.9] Alcohol withdrawal, with unspecified complication ([employer]) [S28.315]  DISCHARGE DIAGNOSIS:  Principal Problem:   Alcohol withdrawal seizure ([place]) Active Problems:   Alcohol use disorder, severe, dependence ([place])   Hyponatremia   Pregnancy   SIRS    Tobacco abuse   Vaginal bleeding   SECONDARY DIAGNOSIS:   Past Medical History  Diagnosis Date  . Depression with anxiety initally at age 27  . Chlamydia 2007    bacterial vaginosis 09/2011  . Irregular heart beat 2010    wt/diet related after evaluation  . Renal disorder   . Pneumothorax, spontaneous, tension   . Alcohol abuse 11/2014    drinking since age 20. chronic, recurrent. at least 2 detox admits before 2015.   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   . Failure to thrive in adult 12/2014.     Malnutrition: n/v, not eating, weight loss, BMI 14.  s/p 01/11/2015 PEG (Dr Dorna Leitz).   . Alcoholic hepatitis 08/7614    hepatic steatosis on 11/2014 ultrasound.   . Seizure ([place]) 06/2015    due to ETOH/benzo withdrawal.   . Thrombocytopenia ([place]) 04/2007  . Colitis 12/2014    colonoscopy for diarrhea and abnml CT 01/06/15: erythmatous TI (path: active ileitis, ? emerging IBD), rectal erythema (path: mucosal prolapse). Random bx of normal colon (path unremarkable)   . Spontaneous pneumothorax 08/2012    right.  chest tube placed.     HOSPITAL COURSE:   30 year old female with past medical history of alcohol abuse, depression with anxiety, history of alcoholic hepatitis, history of alcohol withdrawal seizures, spontaneous pneumothorax who presented to the hospital due to a alcohol  withdrawal seizure and also some vaginal bleeding.  1. Seizures-this was secondary to alcohol withdrawal. She had no acute seizures while in the hospital. She was maintained on CIWA protocol. She has improved and now being discharged home.  2. Vaginal bleeding-patient is [redacted] weeks pregnant. Patient's ultrasound does show a live intrauterine pregnancy at 9 weeks. -She was seen by OB/GYN and they had no further recommendations as her ultrasound was stable and she had no acute bleeding while in the hospital. She will continue follow-up as outpatient. -Continue prenatal vitamins.  3. Depression -Patient was seen by psychiatry and denied any suicidal or homicidal ideations. She did not meet any criteria for inpatient psychiatric admission.   4. Anxiety-continue Klonopin. -As per OB/GYN this is a class D pregnancy drug. Discussed with psychiatry and the plan on continuing this for now.    DISCHARGE CONDITIONS:   Stable.   CONSULTS OBTAINED:  Treatment Team:  Gonzella Lex, MD  DRUG ALLERGIES:   Allergies  Allergen Reactions  . Aspirin Shortness Of Breath  . Effexor [Venlafaxine] Anaphylaxis  . Sulfa Antibiotics Anaphylaxis  . Tetracyclines & Related Anaphylaxis  . Trazodone And Nefazodone Anaphylaxis  . Latex Hives and Itching  . Paxil [Paroxetine] Hives and Itching  . Seroquel [Quetiapine Fumarate] Hives and Itching  . Zoloft [Sertraline Hcl] Hives and Itching    DISCHARGE MEDICATIONS:   Discharge Medication List as of 12/23/2015 11:28 AM    CONTINUE these medications which have  NOT CHANGED   Details  clonazePAM (KLONOPIN) 0.25 MG disintegrating tablet Take 0.25 mg by mouth 2 (two) times daily., Until Discontinued, Historical Med    ondansetron (ZOFRAN) 4 MG tablet Take 1 tablet (4 mg total) by mouth every 6 (six) hours as needed for nausea., Starting 12/10/2015, Until Discontinued, Print    Prenatal Vit-Fe Fumarate-FA (PRENATAL MULTIVITAMIN) TABS tablet Take 1 tablet by mouth  daily., Until Discontinued, Historical Med      STOP taking these medications     chlordiazePOXIDE (LIBRIUM) 25 MG capsule          DISCHARGE INSTRUCTIONS:   DIET:  Regular diet  DISCHARGE CONDITION:  Stable  ACTIVITY:  Activity as tolerated  OXYGEN:  Home Oxygen: No.   Oxygen Delivery: room air  DISCHARGE LOCATION:  home   If you experience worsening of your admission symptoms, develop shortness of breath, life threatening emergency, suicidal or homicidal thoughts you must seek medical attention immediately by calling 911 or calling your MD immediately  if symptoms less severe.  You Must read complete instructions/literature along with all the possible adverse reactions/side effects for all the Medicines you take and that have been prescribed to you. Take any new Medicines after you have completely understood and accpet all the possible adverse reactions/side effects.   Please note  You were cared for by a hospitalist during your hospital stay. If you have any questions about your discharge medications or the care you received while you were in the hospital after you are discharged, you can call the unit and asked to speak with the hospitalist on call if the hospitalist that took care of you is not available. Once you are discharged, your primary care physician will handle any further medical issues. Please note that NO REFILLS for any discharge medications will be authorized once you are discharged, as it is imperative that you return to your primary care physician (or establish a relationship with a primary care physician if you do not have one) for your aftercare needs so that they can reassess your need for medications and monitor your lab values.     Today   Lethargic and sleepy.  No complaints. AAOX3.    VITAL SIGNS:  Blood pressure 115/73, pulse 87, temperature 97.9 F (36.6 C), temperature source Oral, resp. rate 20, height 5' 6"  (1.676 m), weight 48.762 kg  (107 lb 8 oz), last menstrual period 07/28/2015, SpO2 100 %.  I/O:   Intake/Output Summary (Last 24 hours) at 12/23/15 1558 Last data filed at 12/23/15 1130  Gross per 24 hour  Intake    480 ml  Output    400 ml  Net     80 ml    PHYSICAL EXAMINATION:  GENERAL:  30 y.o.-year-old patient lying in the bed with no acute distress.  EYES: Pupils equal, round, reactive to light and accommodation. No scleral icterus. Extraocular muscles intact.  HEENT: Head atraumatic, normocephalic. Oropharynx and nasopharynx clear.  NECK:  Supple, no jugular venous distention. No thyroid enlargement, no tenderness.  LUNGS: Normal breath sounds bilaterally, no wheezing, rales,rhonchi. No use of accessory muscles of respiration.  CARDIOVASCULAR: S1, S2 normal. No murmurs, rubs, or gallops.  ABDOMEN: Soft, non-tender, non-distended. Bowel sounds present. No organomegaly or mass.  EXTREMITIES: No pedal edema, cyanosis, or clubbing.  NEUROLOGIC: Cranial nerves II through XII are intact. No focal motor or sensory defecits b/l.  PSYCHIATRIC: The patient is alert and oriented x 3. Good affect.  SKIN: No obvious rash, lesion,  or ulcer.   DATA REVIEW:   CBC  Recent Labs Lab 12/21/15 0217  WBC 12.8*  HGB 15.2  HCT 44.7  PLT 256    Chemistries   Recent Labs Lab 12/20/15 1925 12/21/15 0217  NA 132* 134*  K 3.9 3.8  CL 100* 103  CO2 21* 20*  GLUCOSE 124* 83  BUN 17 14  CREATININE 0.51 0.49  CALCIUM 8.7* 8.4*  MG  --  1.6*  AST 795*  --   ALT 425*  --   ALKPHOS 177*  --   BILITOT 0.9  --     Cardiac Enzymes No results for input(s): TROPONINI in the last 168 hours.   RADIOLOGY:  No results found.    Management plans discussed with the patient, family and they are in agreement.  CODE STATUS:     Code Status Orders        Start     Ordered   12/21/15 0122  Full code   Continuous     12/21/15 0121    Code Status History    Date Active Date Inactive Code Status Order ID  Comments User Context   12/20/2015  7:40 PM 12/21/2015  1:21 AM Full Code [id number]  Orbie Pyo, MD ED   12/16/2015  1:42 AM 12/16/2015  5:10 PM Full Code [id number]  Merryl Hacker, MD ED   12/08/2015 12:39 AM 12/10/2015  5:18 PM Full Code [id number]  Fritzi Mandes, MD Inpatient   10/14/2015  8:31 PM 10/22/2015  8:29 PM Full Code [id number]  Vaughan Basta, MD Inpatient   10/05/2015  3:34 AM 10/05/2015  5:53 PM Full Code [id number]  Lance Coon, MD ED   08/08/2015  5:52 PM 08/10/2015  8:21 PM Full Code [id number]  Elmarie Shiley, MD Inpatient   07/09/2015  6:11 PM 07/14/2015  5:56 PM Full Code [id number]  Theodis Blaze, MD Inpatient   07/01/2015 12:32 AM 07/02/2015  5:52 PM Full Code [id number]  Lytle Butte, MD ED   06/21/2015 12:25 PM 06/22/2015  4:08 PM Full Code [id number]  Epifanio Lesches, MD ED   05/20/2015  7:56 PM 05/23/2015  3:55 PM Full Code [id number]  Fritzi Mandes, MD Inpatient   04/16/2015  9:21 PM 04/18/2015  4:55 PM Full Code [id number]  Idelle Crouch, MD Inpatient   03/01/2015  8:52 PM 03/03/2015  4:05 PM Full Code [id number]  Lytle Butte, MD ED   01/04/2015  7:23 PM 01/13/2015  7:12 PM Full Code [id number]  Fritzi Mandes, MD ED   11/24/2014  6:21 PM 11/26/2014  1:29 PM Full Code [id number]  Serita Grit, MD ED      TOTAL TIME TAKING CARE OF THIS PATIENT: 40 minutes.    Henreitta Leber M.D on 12/23/2015 at 3:58 PM  Between 7am to 6pm - Pager - [phone]  After 6pm go to [email or web address] - password [email or web address] [facility]  [place] [facility]  Office  [phone]  CC: Primary care physician; Leonides Sake, MD

## 2015-12-29 ENCOUNTER — Encounter: Payer: Self-pay | Admitting: Obstetrics and Gynecology

## 2015-12-29 ENCOUNTER — Ambulatory Visit (INDEPENDENT_AMBULATORY_CARE_PROVIDER_SITE_OTHER): Payer: Self-pay | Admitting: Obstetrics and Gynecology

## 2015-12-29 VITALS — BP 112/78 | HR 125 | Wt 108.5 lb

## 2015-12-29 DIAGNOSIS — F10939 Alcohol use, unspecified with withdrawal, unspecified: Secondary | ICD-10-CM

## 2015-12-29 DIAGNOSIS — F411 Generalized anxiety disorder: Secondary | ICD-10-CM

## 2015-12-29 DIAGNOSIS — F1310 Sedative, hypnotic or anxiolytic abuse, uncomplicated: Secondary | ICD-10-CM

## 2015-12-29 DIAGNOSIS — F1020 Alcohol dependence, uncomplicated: Secondary | ICD-10-CM

## 2015-12-29 DIAGNOSIS — K766 Portal hypertension: Secondary | ICD-10-CM

## 2015-12-29 DIAGNOSIS — K7010 Alcoholic hepatitis without ascites: Secondary | ICD-10-CM

## 2015-12-29 DIAGNOSIS — F10239 Alcohol dependence with withdrawal, unspecified: Secondary | ICD-10-CM

## 2015-12-29 DIAGNOSIS — O0991 Supervision of high risk pregnancy, unspecified, first trimester: Secondary | ICD-10-CM

## 2015-12-29 DIAGNOSIS — Z720 Tobacco use: Secondary | ICD-10-CM

## 2015-12-29 DIAGNOSIS — Z3491 Encounter for supervision of normal pregnancy, unspecified, first trimester: Secondary | ICD-10-CM

## 2015-12-29 DIAGNOSIS — R569 Unspecified convulsions: Secondary | ICD-10-CM

## 2015-12-29 DIAGNOSIS — I81 Portal vein thrombosis: Secondary | ICD-10-CM

## 2015-12-29 DIAGNOSIS — F1994 Other psychoactive substance use, unspecified with psychoactive substance-induced mood disorder: Secondary | ICD-10-CM

## 2015-12-29 DIAGNOSIS — E43 Unspecified severe protein-calorie malnutrition: Secondary | ICD-10-CM

## 2015-12-29 DIAGNOSIS — O219 Vomiting of pregnancy, unspecified: Secondary | ICD-10-CM

## 2015-12-29 LAB — POCT URINALYSIS DIPSTICK
BILIRUBIN UA: NEGATIVE
Glucose, UA: NEGATIVE
Ketones, UA: NEGATIVE
LEUKOCYTES UA: NEGATIVE
NITRITE UA: NEGATIVE
PH UA: 6.5
RBC UA: NEGATIVE
Spec Grav, UA: 1.015
UROBILINOGEN UA: NEGATIVE

## 2015-12-29 MED ORDER — ONDANSETRON HCL 4 MG PO TABS: 4.0000 mg | ORAL_TABLET | Freq: Four times a day (QID) | ORAL | Status: DC | PRN

## 2015-12-30 ENCOUNTER — Inpatient Hospital Stay (HOSPITAL_COMMUNITY)
Admission: EM | Admit: 2015-12-30 | Discharge: 2016-01-02 | DRG: 781 | Disposition: A | Discharge status: Home / Self Care | Payer: Medicaid Other | Attending: Internal Medicine | Admitting: Internal Medicine

## 2015-12-30 ENCOUNTER — Emergency Department (HOSPITAL_COMMUNITY): Payer: Medicaid Other

## 2015-12-30 ENCOUNTER — Encounter (HOSPITAL_COMMUNITY): Payer: Self-pay | Admitting: *Deleted

## 2015-12-30 DIAGNOSIS — F10939 Alcohol use, unspecified with withdrawal, unspecified: Secondary | ICD-10-CM

## 2015-12-30 DIAGNOSIS — Z882 Allergy status to sulfonamides status: Secondary | ICD-10-CM

## 2015-12-30 DIAGNOSIS — K7010 Alcoholic hepatitis without ascites: Secondary | ICD-10-CM | POA: Diagnosis present

## 2015-12-30 DIAGNOSIS — Z881 Allergy status to other antibiotic agents status: Secondary | ICD-10-CM

## 2015-12-30 DIAGNOSIS — Z888 Allergy status to other drugs, medicaments and biological substances status: Secondary | ICD-10-CM

## 2015-12-30 DIAGNOSIS — Y908 Blood alcohol level of 240 mg/100 ml or more: Secondary | ICD-10-CM | POA: Diagnosis present

## 2015-12-30 DIAGNOSIS — Z91040 Latex allergy status: Secondary | ICD-10-CM

## 2015-12-30 DIAGNOSIS — F10230 Alcohol dependence with withdrawal, uncomplicated: Secondary | ICD-10-CM

## 2015-12-30 DIAGNOSIS — F411 Generalized anxiety disorder: Secondary | ICD-10-CM | POA: Diagnosis present

## 2015-12-30 DIAGNOSIS — Z720 Tobacco use: Secondary | ICD-10-CM

## 2015-12-30 DIAGNOSIS — Z331 Pregnant state, incidental: Secondary | ICD-10-CM | POA: Diagnosis not present

## 2015-12-30 DIAGNOSIS — Z79899 Other long term (current) drug therapy: Secondary | ICD-10-CM

## 2015-12-30 DIAGNOSIS — F17210 Nicotine dependence, cigarettes, uncomplicated: Secondary | ICD-10-CM | POA: Diagnosis present

## 2015-12-30 DIAGNOSIS — O99311 Alcohol use complicating pregnancy, first trimester: Principal | ICD-10-CM | POA: Diagnosis present

## 2015-12-30 DIAGNOSIS — Z3A10 10 weeks gestation of pregnancy: Secondary | ICD-10-CM

## 2015-12-30 DIAGNOSIS — F10239 Alcohol dependence with withdrawal, unspecified: Secondary | ICD-10-CM | POA: Diagnosis present

## 2015-12-30 DIAGNOSIS — O99332 Smoking (tobacco) complicating pregnancy, second trimester: Secondary | ICD-10-CM | POA: Diagnosis present

## 2015-12-30 DIAGNOSIS — R1084 Generalized abdominal pain: Secondary | ICD-10-CM | POA: Diagnosis present

## 2015-12-30 DIAGNOSIS — Z654 Victim of crime and terrorism: Secondary | ICD-10-CM

## 2015-12-30 DIAGNOSIS — R102 Pelvic and perineal pain: Secondary | ICD-10-CM | POA: Diagnosis present

## 2015-12-30 DIAGNOSIS — R251 Tremor, unspecified: Secondary | ICD-10-CM

## 2015-12-30 DIAGNOSIS — R109 Unspecified abdominal pain: Secondary | ICD-10-CM | POA: Diagnosis present

## 2015-12-30 DIAGNOSIS — O99341 Other mental disorders complicating pregnancy, first trimester: Secondary | ICD-10-CM | POA: Diagnosis present

## 2015-12-30 DIAGNOSIS — IMO0002 Reserved for concepts with insufficient information to code with codable children: Secondary | ICD-10-CM

## 2015-12-30 DIAGNOSIS — O26611 Liver and biliary tract disorders in pregnancy, first trimester: Secondary | ICD-10-CM | POA: Diagnosis present

## 2015-12-30 DIAGNOSIS — K7011 Alcoholic hepatitis with ascites: Secondary | ICD-10-CM | POA: Diagnosis present

## 2015-12-30 DIAGNOSIS — Z3490 Encounter for supervision of normal pregnancy, unspecified, unspecified trimester: Secondary | ICD-10-CM

## 2015-12-30 LAB — WET PREP, GENITAL
Clue Cells Wet Prep HPF POC: NONE SEEN
SPERM: NONE SEEN
Trich, Wet Prep: NONE SEEN
Yeast Wet Prep HPF POC: NONE SEEN

## 2015-12-30 LAB — RAPID URINE DRUG SCREEN, HOSP PERFORMED
AMPHETAMINES: NOT DETECTED
BARBITURATES: NOT DETECTED
BENZODIAZEPINES: POSITIVE — AB
COCAINE: NOT DETECTED
Opiates: NOT DETECTED
TETRAHYDROCANNABINOL: NOT DETECTED

## 2015-12-30 LAB — ABO/RH: ABO/RH(D): O POS

## 2015-12-30 LAB — CBC WITH DIFFERENTIAL/PLATELET
Basophils Absolute: 0 10*3/uL (ref 0.0–0.1)
Basophils Relative: 0 %
Eosinophils Absolute: 0 10*3/uL (ref 0.0–0.7)
Eosinophils Relative: 0 %
HEMATOCRIT: 38.1 % (ref 36.0–46.0)
HEMOGLOBIN: 12.9 g/dL (ref 12.0–15.0)
LYMPHS ABS: 2.7 10*3/uL (ref 0.7–4.0)
Lymphocytes Relative: 40 %
MCH: 30.6 pg (ref 26.0–34.0)
MCHC: 33.9 g/dL (ref 30.0–36.0)
MCV: 90.3 fL (ref 78.0–100.0)
MONOS PCT: 7 %
Monocytes Absolute: 0.5 10*3/uL (ref 0.1–1.0)
NEUTROS ABS: 3.5 10*3/uL (ref 1.7–7.7)
NEUTROS PCT: 53 %
Platelets: 266 10*3/uL (ref 150–400)
RBC: 4.22 MIL/uL (ref 3.87–5.11)
RDW: 15.6 % — ABNORMAL HIGH (ref 11.5–15.5)
WBC: 6.7 10*3/uL (ref 4.0–10.5)

## 2015-12-30 LAB — BASIC METABOLIC PANEL
Anion gap: 9 (ref 5–15)
BUN: 8 mg/dL (ref 6–20)
CHLORIDE: 106 mmol/L (ref 101–111)
CO2: 23 mmol/L (ref 22–32)
CREATININE: 0.37 mg/dL — AB (ref 0.44–1.00)
Calcium: 8.6 mg/dL — ABNORMAL LOW (ref 8.9–10.3)
GFR calc Af Amer: >60 mL/min (ref >60)
GFR calc non Af Amer: >60 mL/min (ref >60)
Glucose, Bld: 94 mg/dL (ref 65–99)
Potassium: 3.9 mmol/L (ref 3.5–5.1)
Sodium: 138 mmol/L (ref 135–145)

## 2015-12-30 LAB — URINALYSIS, ROUTINE W REFLEX MICROSCOPIC
BILIRUBIN URINE: NEGATIVE
Glucose, UA: NEGATIVE mg/dL
Hgb urine dipstick: NEGATIVE
Ketones, ur: NEGATIVE mg/dL
LEUKOCYTES UA: NEGATIVE
NITRITE: NEGATIVE
PH: 7.5 (ref 5.0–8.0)
Protein, ur: NEGATIVE mg/dL
SPECIFIC GRAVITY, URINE: 1.008 (ref 1.005–1.030)

## 2015-12-30 LAB — HEPATIC FUNCTION PANEL
ALT: 66 U/L — AB (ref 14–54)
AST: 132 U/L — AB (ref 15–41)
Albumin: 3.5 g/dL (ref 3.5–5.0)
Alkaline Phosphatase: 119 U/L (ref 38–126)
BILIRUBIN INDIRECT: 0.6 mg/dL (ref 0.3–0.9)
Bilirubin, Direct: 0.1 mg/dL (ref 0.1–0.5)
TOTAL PROTEIN: 7.5 g/dL (ref 6.5–8.1)
Total Bilirubin: 0.7 mg/dL (ref 0.3–1.2)

## 2015-12-30 LAB — HCG, QUANTITATIVE, PREGNANCY: HCG, BETA CHAIN, QUANT, S: 162041 m[IU]/mL — AB (ref <5)

## 2015-12-30 LAB — ETHANOL: Alcohol, Ethyl (B): 285 mg/dL — ABNORMAL HIGH (ref <5)

## 2015-12-30 MED ORDER — ACETAMINOPHEN 650 MG RE SUPP: 650.0000 mg | Freq: Four times a day (QID) | RECTAL | Status: DC | PRN

## 2015-12-30 MED ORDER — LORAZEPAM 2 MG/ML IJ SOLN: 1.0000 mg | Freq: Four times a day (QID) | INTRAMUSCULAR | Status: DC | PRN

## 2015-12-30 MED ORDER — LORAZEPAM 2 MG/ML IJ SOLN
INTRAMUSCULAR | Status: AC
  Administered 2015-12-30: 2 mg
  Filled 2015-12-30: qty 1

## 2015-12-30 MED ORDER — THIAMINE HCL 100 MG/ML IJ SOLN
100.0000 mg | Freq: Every day | INTRAMUSCULAR | Status: DC
  Filled 2015-12-30 (×3): qty 1

## 2015-12-30 MED ORDER — ACETAMINOPHEN 325 MG PO TABS: 650.0000 mg | ORAL_TABLET | Freq: Four times a day (QID) | ORAL | Status: DC | PRN

## 2015-12-30 MED ORDER — ACETAMINOPHEN 325 MG PO TABS: 650.0000 mg | ORAL_TABLET | ORAL | Status: DC | PRN

## 2015-12-30 MED ORDER — ADULT MULTIVITAMIN W/MINERALS CH
1.0000 | ORAL_TABLET | Freq: Every day | ORAL | Status: DC
  Administered 2015-12-31 – 2016-01-02 (×3): 1 via ORAL
  Filled 2015-12-30 (×3): qty 1

## 2015-12-30 MED ORDER — ONDANSETRON HCL 4 MG PO TABS: 4.0000 mg | ORAL_TABLET | Freq: Four times a day (QID) | ORAL | Status: DC | PRN

## 2015-12-30 MED ORDER — LORAZEPAM 1 MG PO TABS: 0.0000 mg | ORAL_TABLET | Freq: Two times a day (BID) | ORAL | Status: DC

## 2015-12-30 MED ORDER — LORAZEPAM 1 MG PO TABS
0.0000 mg | ORAL_TABLET | Freq: Two times a day (BID) | ORAL | Status: DC
Start: 2016-01-01 — End: 2015-12-30

## 2015-12-30 MED ORDER — FOLIC ACID 1 MG PO TABS
1.0000 mg | ORAL_TABLET | Freq: Every day | ORAL | Status: DC
  Administered 2015-12-31 – 2016-01-02 (×3): 1 mg via ORAL
  Filled 2015-12-30 (×3): qty 1

## 2015-12-30 MED ORDER — DIPHENHYDRAMINE HCL 25 MG PO CAPS: 25.0000 mg | ORAL_CAPSULE | Freq: Three times a day (TID) | ORAL | Status: DC | PRN

## 2015-12-30 MED ORDER — LORAZEPAM 1 MG PO TABS
0.0000 mg | ORAL_TABLET | Freq: Four times a day (QID) | ORAL | Status: DC
Start: 2015-12-30 — End: 2015-12-30

## 2015-12-30 MED ORDER — VITAMIN B-1 100 MG PO TABS
100.0000 mg | ORAL_TABLET | Freq: Every day | ORAL | Status: DC
  Administered 2015-12-31 – 2016-01-02 (×3): 100 mg via ORAL
  Filled 2015-12-30 (×3): qty 1

## 2015-12-30 MED ORDER — HYDROMORPHONE HCL 1 MG/ML IJ SOLN: 0.5000 mg | INTRAMUSCULAR | Status: DC | PRN

## 2015-12-30 MED ORDER — ONDANSETRON HCL 4 MG/2ML IJ SOLN
4.0000 mg | Freq: Four times a day (QID) | INTRAMUSCULAR | Status: DC | PRN
Start: 2015-12-30 — End: 2016-01-02
  Administered 2015-12-31 – 2016-01-02 (×5): 4 mg via INTRAVENOUS
  Filled 2015-12-30 (×5): qty 2

## 2015-12-30 MED ORDER — PRENATAL MULTIVITAMIN CH
1.0000 | ORAL_TABLET | Freq: Every day | ORAL | Status: DC
  Administered 2015-12-31 – 2016-01-02 (×4): 1 via ORAL
  Filled 2015-12-30 (×4): qty 1

## 2015-12-30 MED ORDER — LORAZEPAM 1 MG PO TABS: 0.0000 mg | ORAL_TABLET | Freq: Four times a day (QID) | ORAL | Status: DC

## 2015-12-30 MED ORDER — LORAZEPAM 1 MG PO TABS: 1.0000 mg | ORAL_TABLET | Freq: Four times a day (QID) | ORAL | Status: DC | PRN

## 2015-12-30 MED ORDER — SODIUM CHLORIDE 0.9 % IV SOLN
INTRAVENOUS | Status: DC
  Administered 2015-12-31 – 2016-01-01 (×3): via INTRAVENOUS
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
## 2015-12-30 NOTE — ED Notes (Signed)
No active bleeding

## 2015-12-30 NOTE — ED Notes (Signed)
TTS SPEAKING VIA PSYCH MONITOR

## 2015-12-30 NOTE — ED Notes (Signed)
Pt witnessed having seizure like activity for approximately 15sec x 2. After seizure like activity, oriented x 4 and able to follow commands without difficulty. Seizure pads placed on the bed and MD notified. 26m IV Ativan given.
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
## 2015-12-30 NOTE — ED Notes (Signed)
EDPA EMILY AND EDPA Jasiri Hanawalt at bedside.

## 2015-12-30 NOTE — ED Notes (Signed)
Bed: PU92 Expected date:  Expected time:  Means of arrival:  Comments: Hold for hall a
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
## 2015-12-30 NOTE — ED Notes (Signed)
ED PA at bedside

## 2015-12-30 NOTE — ED Notes (Addendum)
Pt brought in by EMS from home, c/o diffuse abd pain n/v. ETOH , pt reports preg 10 weeks 4 days
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
## 2015-12-30 NOTE — ED Notes (Signed)
Patient transported to Ultrasound 

## 2015-12-31 DIAGNOSIS — Z888 Allergy status to other drugs, medicaments and biological substances status: Secondary | ICD-10-CM | POA: Diagnosis not present

## 2015-12-31 DIAGNOSIS — O26611 Liver and biliary tract disorders in pregnancy, first trimester: Secondary | ICD-10-CM | POA: Diagnosis present

## 2015-12-31 DIAGNOSIS — F10230 Alcohol dependence with withdrawal, uncomplicated: Secondary | ICD-10-CM | POA: Diagnosis not present

## 2015-12-31 DIAGNOSIS — O99332 Smoking (tobacco) complicating pregnancy, second trimester: Secondary | ICD-10-CM | POA: Diagnosis present

## 2015-12-31 DIAGNOSIS — R1084 Generalized abdominal pain: Secondary | ICD-10-CM | POA: Diagnosis not present

## 2015-12-31 DIAGNOSIS — Z881 Allergy status to other antibiotic agents status: Secondary | ICD-10-CM | POA: Diagnosis not present

## 2015-12-31 DIAGNOSIS — O0001 Abdominal pregnancy with intrauterine pregnancy: Secondary | ICD-10-CM | POA: Diagnosis not present

## 2015-12-31 DIAGNOSIS — F17210 Nicotine dependence, cigarettes, uncomplicated: Secondary | ICD-10-CM | POA: Diagnosis present

## 2015-12-31 DIAGNOSIS — Z654 Victim of crime and terrorism: Secondary | ICD-10-CM | POA: Diagnosis not present

## 2015-12-31 DIAGNOSIS — Y908 Blood alcohol level of 240 mg/100 ml or more: Secondary | ICD-10-CM | POA: Diagnosis present

## 2015-12-31 DIAGNOSIS — R102 Pelvic and perineal pain: Secondary | ICD-10-CM | POA: Diagnosis present

## 2015-12-31 DIAGNOSIS — Z882 Allergy status to sulfonamides status: Secondary | ICD-10-CM | POA: Diagnosis not present

## 2015-12-31 DIAGNOSIS — K7011 Alcoholic hepatitis with ascites: Secondary | ICD-10-CM | POA: Diagnosis present

## 2015-12-31 DIAGNOSIS — F411 Generalized anxiety disorder: Secondary | ICD-10-CM | POA: Diagnosis present

## 2015-12-31 DIAGNOSIS — O99341 Other mental disorders complicating pregnancy, first trimester: Secondary | ICD-10-CM | POA: Diagnosis present

## 2015-12-31 DIAGNOSIS — Z79899 Other long term (current) drug therapy: Secondary | ICD-10-CM | POA: Diagnosis not present

## 2015-12-31 DIAGNOSIS — F10239 Alcohol dependence with withdrawal, unspecified: Secondary | ICD-10-CM | POA: Diagnosis present

## 2015-12-31 DIAGNOSIS — Z91040 Latex allergy status: Secondary | ICD-10-CM | POA: Diagnosis not present

## 2015-12-31 DIAGNOSIS — Z3A10 10 weeks gestation of pregnancy: Secondary | ICD-10-CM | POA: Diagnosis not present

## 2015-12-31 DIAGNOSIS — O99311 Alcohol use complicating pregnancy, first trimester: Secondary | ICD-10-CM | POA: Diagnosis present

## 2015-12-31 DIAGNOSIS — R109 Unspecified abdominal pain: Secondary | ICD-10-CM | POA: Diagnosis present

## 2015-12-31 LAB — CBC
HCT: 38 % (ref 36.0–46.0)
Hemoglobin: 12.7 g/dL (ref 12.0–15.0)
MCH: 30.4 pg (ref 26.0–34.0)
MCHC: 33.4 g/dL (ref 30.0–36.0)
MCV: 90.9 fL (ref 78.0–100.0)
PLATELETS: 287 10*3/uL (ref 150–400)
RBC: 4.18 MIL/uL (ref 3.87–5.11)
RDW: 15.6 % — AB (ref 11.5–15.5)
WBC: 6.9 10*3/uL (ref 4.0–10.5)

## 2015-12-31 LAB — BASIC METABOLIC PANEL
Anion gap: 9 (ref 5–15)
BUN: 14 mg/dL (ref 6–20)
CALCIUM: 9.1 mg/dL (ref 8.9–10.3)
CO2: 23 mmol/L (ref 22–32)
CREATININE: 0.4 mg/dL — AB (ref 0.44–1.00)
Chloride: 105 mmol/L (ref 101–111)
GFR calc non Af Amer: >60 mL/min (ref >60)
GLUCOSE: 72 mg/dL (ref 65–99)
Potassium: 3.8 mmol/L (ref 3.5–5.1)
Sodium: 137 mmol/L (ref 135–145)

## 2015-12-31 MED ORDER — LORAZEPAM 1 MG PO TABS
1.0000 mg | ORAL_TABLET | ORAL | Status: DC | PRN
  Administered 2016-01-02: 1 mg via ORAL
  Filled 2015-12-31 (×2): qty 1

## 2015-12-31 MED ORDER — LORAZEPAM 2 MG/ML IJ SOLN
1.0000 mg | Freq: Four times a day (QID) | INTRAMUSCULAR | Status: DC | PRN
  Administered 2015-12-31 – 2016-01-01 (×4): 1 mg via INTRAVENOUS
  Filled 2015-12-31 (×4): qty 1

## 2015-12-31 MED ORDER — LORAZEPAM 2 MG/ML IJ SOLN
1.0000 mg | Freq: Four times a day (QID) | INTRAMUSCULAR | Status: DC | PRN
  Administered 2015-12-31: 1 mg via INTRAVENOUS
  Filled 2015-12-31: qty 1

## 2015-12-31 MED ORDER — LORAZEPAM 2 MG/ML IJ SOLN
INTRAMUSCULAR | Status: AC
  Filled 2015-12-31: qty 1

## 2015-12-31 MED ORDER — LORAZEPAM 1 MG PO TABS: 1.0000 mg | ORAL_TABLET | Freq: Four times a day (QID) | ORAL | Status: DC | PRN

## 2015-12-31 MED ORDER — LORAZEPAM 2 MG/ML IJ SOLN
1.0000 mg | INTRAMUSCULAR | Status: DC | PRN
  Administered 2015-12-31 – 2016-01-02 (×4): 1 mg via INTRAVENOUS
  Filled 2015-12-31 (×3): qty 1

## 2015-12-31 NOTE — Progress Notes (Signed)
Pt arrived to unit room 1508 via stretcher. VS taken, gait unsteady pain 2/10. General weakness. Pt oriented to room and callbell with no complications. Initial assessment completed. Will continue to monitor and intervene as appropriate.Pt guide at the bedside and safety contract signed.
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
## 2016-01-01 LAB — URINE CULTURE
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
## 2016-01-02 ENCOUNTER — Telehealth: Payer: Self-pay

## 2016-01-02 ENCOUNTER — Encounter: Payer: Self-pay | Admitting: Obstetrics and Gynecology

## 2016-01-02 DIAGNOSIS — K7011 Alcoholic hepatitis with ascites: Secondary | ICD-10-CM

## 2016-01-02 DIAGNOSIS — F411 Generalized anxiety disorder: Secondary | ICD-10-CM

## 2016-01-02 DIAGNOSIS — B952 Enterococcus as the cause of diseases classified elsewhere: Secondary | ICD-10-CM

## 2016-01-02 DIAGNOSIS — R251 Tremor, unspecified: Secondary | ICD-10-CM

## 2016-01-02 LAB — GC/CHLAMYDIA PROBE AMP (~~LOC~~) NOT AT ARMC
Chlamydia: NEGATIVE
NEISSERIA GONORRHEA: NEGATIVE

## 2016-01-02 LAB — NUSWAB VAGINITIS PLUS (VG+)
CANDIDA ALBICANS, NAA: NEGATIVE
CANDIDA GLABRATA, NAA: NEGATIVE
Chlamydia trachomatis, NAA: NEGATIVE
Neisseria gonorrhoeae, NAA: NEGATIVE
Trich vag by NAA: NEGATIVE

## 2016-01-02 MED ORDER — ONDANSETRON HCL 4 MG PO TABS: 4.0000 mg | ORAL_TABLET | Freq: Four times a day (QID) | ORAL | Status: DC | PRN

## 2016-01-02 MED ORDER — NITROFURANTOIN MONOHYD MACRO 100 MG PO CAPS
100.0000 mg | ORAL_CAPSULE | Freq: Two times a day (BID) | ORAL | Status: DC
Start: 2016-01-02 — End: 2016-01-17

## 2016-01-02 NOTE — Telephone Encounter (Signed)
Called pt, phone number listed is disconnected. Pt letter sent via myChart and mail. RX sent in.

## 2016-01-02 NOTE — Progress Notes (Signed)
OBSTETRIC INITIAL PRENATAL VISIT  Subjective:    Kristine Macias is being seen today for her first obstetrical visit.  This is not a planned pregnancy. She is a 30 y.o. [id number] female at 27w6dgestation, Estimated Date of Delivery: 07/24/16 with last menstrual period of 07/28/2015 (consistent with 7 week sono). 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 Was recently discharged 2 weeks ago from inpatient detox for recent alcohol withdrawal episode with seizures. Relationship with FOB: significant other, living together. Patient does intend to breast feed. Pregnancy history fully reviewed.    Obstetric History   G6   P2   T2   P0   A3   TAB1   SAB2   E0   M0   L2     # Outcome Date GA Lbr Len/2nd Weight Sex Delivery Anes PTL Lv  6 Current           5 SAB 2015          4 TAB 2012          3 Term 2010 [id number]  CS-LTranv  N Y     Complications: Failure to Progress in Second Stage  2 Term 2008 [id number] Vag-Spont   Y  1 SAB               Gynecologic History:  Last pap smear was: patient cannot recall.  Results were normal (per patient).  Denies h/o abnormal pap smears in the past.  Admits to history of STIs: Chlamydia (remote history, treated).    Past Medical History  Diagnosis Date  . Depression with anxiety initally at age [age] Chlamydia 2007    bacterial vaginosis 09/2011  . Irregular heart beat 2010    wt/diet related after evaluation  . Renal disorder   . Pneumothorax, spontaneous, tension   . Alcohol abuse 11/2014    drinking since age 47.42hronic, recurrent. at least 2 detox admits before 2015.   . PMarland Kitchenncreas divisum 02/2015    Type 1 seen on CT.   . Failure to thrive in adult 12/2014.     Malnutrition: n/v, not eating, weight loss, BMI 14.  s/p 01/11/2015 PEG (Dr WreDorna Leitz  . Alcoholic hepatitis 03/18/1516 hepatic steatosis on 11/2014 ultrasound.   . Seiz[place]  ([facility]    due to ETOH/benzo withdrawal.   . Thrombocytopenia ([facility]/2008  . Colitis 12/2014    colonoscopy for diarrhea and abnml CT 01/06/15: erythmatous TI (path: active ileitis, ? emerging IBD), rectal erythema (path: mucosal prolapse). Random bx of normal colon (path unremarkable)   . Spontaneous pneumothorax 08/2012    right.  chest tube placed.   . [facility]   age 27,53ump removed from left breast  . Congenital deafness     left ear only.  Also noted in mother and sibling (unilateral).     Family History  Problem Relation Age of Onset  . Anesthesia problems Neg Hx   . Thyroid disease Mother   . Cancer Mother     breast cancer  . Cancer Father     lung cancer  . Stroke Other   . Crohn's disease Brother   . Cancer Maternal Grandmother     breast cancer    Past Surgical History  Procedure Laterality Date  . Cesarean section  07/2009  x 1.  G3, Para 2012.   . Chest tube insertion    . Esophagogastroduodenoscopy N/A 01/06/2015    ;[facility], Dr Rayann Heman. For wt loss, N/V: Normal study, duodenal biopsy/pathology: chronic active duodenitis.   . Colonoscopy with propofol N/A 01/06/2015    [facility], Dr Rayann Heman. colonoscopy for diarrhea and abnml CT 01/06/15: erythmatous TI (path: active ileitis, ? emerging IBD), rectal erythema (path: mucosal prolapse). Random bx of normal colon (path unremarkable)   . Peg placement N/A 01/11/2015    [facility], Dr Rayann Heman.  for N/V/wt loss/severe malnutrition.   . Esophagogastroduodenoscopy N/A 01/11/2015    Rein-normal with PEG placement    Social History   Social History  . Marital Status: Single    Spouse Name: N/A  . Number of Children: N/A  . Years of Education: N/A   Occupational History  . Not on file.   Social History Main Topics  . Smoking status: Current Every Day Smoker -- 2.00 packs/day    Types: Cigarettes  . Smokeless tobacco: Never Used  . Alcohol Use: 21.6 oz/week    36 Cans of beer per week     Comment: 6 beers/day;  trying to quit, participating in [place]  . Drug Use: No  . Sexual Activity: Yes    Birth Control/ Protection: None   Other Topics Concern  . Not on file   Social History Narrative   Lives with mother & 2 children   disabled    No current facility-administered medications on file prior to visit.   Current Outpatient Prescriptions on File Prior to Visit  Medication Sig Dispense Refill  . clonazePAM (KLONOPIN) 0.25 MG disintegrating tablet Take 0.25 mg by mouth 2 (two) times daily.  1  . Prenatal Vit-Fe Fumarate-FA (PRENATAL MULTIVITAMIN) TABS tablet Take 1 tablet by mouth daily.      Allergies  Allergen Reactions  . Aspirin Shortness Of Breath  . Effexor [Venlafaxine] Anaphylaxis  . Sulfa Antibiotics Anaphylaxis  . Tetracyclines & Related Anaphylaxis  . Trazodone And Nefazodone Anaphylaxis  . Latex Hives and Itching  . Paxil [Paroxetine] Hives and Itching  . Seroquel [Quetiapine Fumarate] Hives and Itching  . Zoloft [Sertraline Hcl] Hives and Itching    Review of Systems General:Not Present- Fever, Weight Loss and Weight Gain. Skin:Not Present- Rash. HEENT:Not Present- Blurred Vision, Headache and Bleeding Gums. Respiratory:Not Present- Difficulty Breathing. Breast:Not Present- Breast Mass. Cardiovascular:Not Present- Chest Pain, Elevated Blood Pressure, Fainting / Blacking Out and Shortness of Breath. Gastrointestinal:Not Present- Abdominal Pain, Constipation, Nausea and Vomiting. Female Genitourinary:Not Present- Frequency, Painful Urination, Pelvic Pain, Vaginal Bleeding, Vaginal Discharge, Contractions, regular, Fetal Movements Decreased, Urinary Complaints and Vaginal Fluid. Musculoskeletal:Not Present- Back Pain and Leg Cramps. Neurological:Not Present- Dizziness. Psychiatric:Not Present- Depression.     Objective:   Blood pressure 112/78, pulse 125, weight 108 lb 8 oz (49.215 kg), last menstrual period 07/28/2015.  Body mass index is 17.52  kg/(m^2).  General Appearance:    Alert, cooperative, no distress, appears stated age, underweight  Head:    Normocephalic, without obvious abnormality, atraumatic  Eyes:    PERRL, conjunctiva/corneas clear, EOM's intact, both eyes  Ears:    Normal external ear canals, both ears  Nose:   Nares normal, septum midline, mucosa normal, no drainage or sinus tenderness  Throat:   Lips, mucosa, and tongue normal; teeth and gums normal  Neck:   Supple, symmetrical, trachea midline, no adenopathy; thyroid: no enlargement/tenderness/nodules; no carotid bruit or JVD  Back:     Symmetric, no curvature,  ROM normal, no CVA tenderness  Lungs:     Clear to auscultation bilaterally, respirations unlabored  Chest Wall:    No tenderness or deformity   Heart:    Regular rate and rhythm, S1 and S2 normal, no murmur, rub or gallop  Breast Exam:    No tenderness, masses, or nipple abnormality  Abdomen:     Soft, non-tender, bowel sounds active all four quadrants, no masses, no organomegaly.  FHT 163 bpm.  Genitalia:    Pelvic:external genitalia normal, vagina without lesions, discharge, or tenderness, rectovaginal septum  normal. Cervix normal in appearance, no cervical motion tenderness, no adnexal masses or tenderness.  Pregnancy positive findings: uterine enlargement: 10 wk size, nontender.   Rectal:    Normal external sphincter.  No hemorrhoids appreciated. Internal exam not done.   Extremities:   Extremities normal, atraumatic, no cyanosis or edema  Pulses:   2+ and symmetric all extremities  Skin:   Skin color, texture, turgor normal, no rashes or lesions  Lymph nodes:   Cervical, supraclavicular, and axillary nodes normal  Neurologic:   CNII-XII intact, normal strength, sensation and reflexes throughout     Assessment:   Pregnancy at 10 and 2/7 weeks   H/o alcoholism with portal vein HTN (also with h/o portal vein thrombosis several months ago) H/o anxiety/depression Underweight H/o prior C-section x  1 Tobacco abuse H/o spontaneous pneumothorax    Plan:    Initial labs ordered. Prenatal vitamins encouraged. Problem list reviewed and updated. Will need referral to [facility] for multiple issues in pregnancy.  Patient may benefit from [facility] due to medical issues, however notes currently that this would be inconvenient for her.  Alcoholism, patient notes that she is currently in an outpatient treatment program (however has had 2 relapses requiring hospitalization since being in program).  Prior attempts for patient to attend inpatient treatment program at [facility] were unsuccessful as program is currently full. Patient aware of need for alcohol cessation, and notes that she is trying.  Tobacco abuse, currently smoking 2 ppd.  Patient notes that she is going to attempt to wean down.  To discuss progress at subsequent visits, and can offer smoking cessation aids if needed (counseling, gum, patch).  H/o portal vein HTN with prior thrombosis due to alcoholism.  Was isolated, patient notes treatment.  Unsure if DVT prophylaxis is warranted in this case.  Can also be discussed with referral.  New OB counseling:  The patient has been given an overview regarding routine prenatal care.  Recommendations regarding diet, weight gain, and exercise in pregnancy were given. Prenatal testing, optional genetic testing, and ultrasound use in pregnancy were reviewed.  AFP3 discussed: undecided.  Is also considering cell-free DNA testing.   Benefits of Breast Feeding were discussed. The patient is encouraged to consider nursing her baby post partum. H/o prior C-section x 1.  Patient currently desires TOLAC.  Discussed risks vs benefits of TOLAC vs repeat C-section.   H/o anxiety/depression, currently on Klonopin.  Notes that she discussed use with Psychiatrist, who recommended that she not come off medication currently.  Informed that this is a class D medication.  Has been tried on several different  Class C anti-depressants, however patient noted allergies or intolerance to several medications.  To discuss further with [facility].   Follow up in 4 weeks.  50% of 60 min visit spent on counseling and coordination of care.     Rubie Maid, MD [facility]
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
## 2016-01-02 NOTE — Telephone Encounter (Signed)
-----   Message from Rubie Maid, MD sent at 01/02/2016 12:49 PM EDT ----- Patient with UTI.  Needs treatment with Macrobid 100 mg BID x 7 days.

## 2016-01-04 ENCOUNTER — Encounter: Payer: Self-pay | Admitting: Obstetrics and Gynecology

## 2016-01-04 LAB — PAP IG AND HPV HIGH-RISK
HPV, HIGH-RISK: POSITIVE — AB
PAP Smear Comment: 0

## 2016-01-05 ENCOUNTER — Telehealth: Payer: Self-pay

## 2016-01-05 NOTE — Telephone Encounter (Signed)
-----   Message from Rubie Maid, MD sent at 01/05/2016 12:02 PM EDT ----- Please inform of abnormal pap smear.  Patient will need a colposcopy. Sent mychart message as well.

## 2016-01-05 NOTE — Telephone Encounter (Signed)
Called pt informed her of abnormal pap and the need for colpo, transferred pt to the front to be scheduled.

## 2016-01-08 LAB — MONITOR DRUG PROFILE 14(MW)
AMPHETAMINE SCREEN URINE: NEGATIVE ng/mL
BARBITURATE SCREEN URINE: NEGATIVE ng/mL
BUPRENORPHINE, URINE: NEGATIVE ng/mL
CANNABINOIDS UR QL SCN: NEGATIVE ng/mL
Cocaine (Metab) Scrn, Ur: NEGATIVE ng/mL
Creatinine(Crt), U: 168.3 mg/dL (ref 20.0–300.0)
FENTANYL, URINE: NEGATIVE pg/mL
Meperidine Screen, Urine: NEGATIVE ng/mL
Methadone Screen, Urine: NEGATIVE ng/mL
OXYCODONE+OXYMORPHONE UR QL SCN: NEGATIVE ng/mL
Opiate Scrn, Ur: NEGATIVE ng/mL
PH UR, DRUG SCRN: 6.3 (ref 4.5–8.9)
Phencyclidine Qn, Ur: NEGATIVE ng/mL
Propoxyphene Scrn, Ur: NEGATIVE ng/mL
SPECIFIC GRAVITY: 1.017
Tramadol Screen, Urine: NEGATIVE ng/mL

## 2016-01-08 LAB — BENZODIAZEPINES CONFIRM, URINE
ALPRAZOLAM CONFIRM: 273 ng/mL
ALPRAZOLAM: POSITIVE — AB
BENZODIAZEPINES: POSITIVE ng/mL — AB
CLONAZEPAM CONFIRM: 402 ng/mL
CLONAZEPAM: POSITIVE — AB
Flurazepam: NEGATIVE
LORAZEPAM: NEGATIVE
MIDAZOLAM: NEGATIVE
NORDIAZEPAM CONFIRM: 133 ng/mL
NORDIAZEPAM: POSITIVE — AB
OXAZEPAM UR: NEGATIVE
TEMAZEPAM: NEGATIVE
TRIAZOLAM: NEGATIVE

## 2016-01-12 ENCOUNTER — Ambulatory Visit
Admission: RE | Admit: 2016-01-12 | Discharge: 2016-01-12 | Disposition: A | Discharge status: Home / Self Care | Payer: Medicaid Other | Source: Ambulatory Visit | Attending: Family Medicine | Admitting: Family Medicine

## 2016-01-12 DIAGNOSIS — K766 Portal hypertension: Secondary | ICD-10-CM

## 2016-01-12 DIAGNOSIS — F1020 Alcohol dependence, uncomplicated: Secondary | ICD-10-CM

## 2016-01-12 DIAGNOSIS — Z3490 Encounter for supervision of normal pregnancy, unspecified, unspecified trimester: Secondary | ICD-10-CM

## 2016-01-12 NOTE — Progress Notes (Signed)
Patient did not show for MFM consultation.  We would be happy to reschedule her if desired.  However, given the complexity of her medical history and social situation, if patient elects to continue the pregnancy after initial MFM consultation, will recommend transfer of care to [facility] or [facility].

## 2016-01-17 ENCOUNTER — Encounter (HOSPITAL_COMMUNITY): Payer: Self-pay | Admitting: Emergency Medicine

## 2016-01-17 ENCOUNTER — Emergency Department (HOSPITAL_COMMUNITY)
Admission: EM | Admit: 2016-01-17 | Discharge: 2016-01-17 | Disposition: A | Discharge status: Home / Self Care | Payer: Medicaid Other | Attending: Emergency Medicine | Admitting: Emergency Medicine

## 2016-01-17 DIAGNOSIS — Z853 Personal history of malignant neoplasm of breast: Secondary | ICD-10-CM | POA: Diagnosis not present

## 2016-01-17 DIAGNOSIS — F17210 Nicotine dependence, cigarettes, uncomplicated: Secondary | ICD-10-CM | POA: Diagnosis not present

## 2016-01-17 DIAGNOSIS — O210 Mild hyperemesis gravidarum: Secondary | ICD-10-CM | POA: Diagnosis not present

## 2016-01-17 DIAGNOSIS — Z79899 Other long term (current) drug therapy: Secondary | ICD-10-CM | POA: Diagnosis not present

## 2016-01-17 DIAGNOSIS — Z3490 Encounter for supervision of normal pregnancy, unspecified, unspecified trimester: Secondary | ICD-10-CM

## 2016-01-17 DIAGNOSIS — O99311 Alcohol use complicating pregnancy, first trimester: Secondary | ICD-10-CM | POA: Insufficient documentation

## 2016-01-17 DIAGNOSIS — F1020 Alcohol dependence, uncomplicated: Secondary | ICD-10-CM | POA: Diagnosis not present

## 2016-01-17 DIAGNOSIS — F1010 Alcohol abuse, uncomplicated: Secondary | ICD-10-CM

## 2016-01-17 DIAGNOSIS — Z3A14 14 weeks gestation of pregnancy: Secondary | ICD-10-CM | POA: Insufficient documentation

## 2016-01-17 DIAGNOSIS — R21 Rash and other nonspecific skin eruption: Secondary | ICD-10-CM | POA: Diagnosis not present

## 2016-01-17 DIAGNOSIS — O99331 Smoking (tobacco) complicating pregnancy, first trimester: Secondary | ICD-10-CM | POA: Insufficient documentation

## 2016-01-17 DIAGNOSIS — R112 Nausea with vomiting, unspecified: Secondary | ICD-10-CM

## 2016-01-17 LAB — COMPREHENSIVE METABOLIC PANEL
ALK PHOS: 149 U/L — AB (ref 38–126)
ALT: 90 U/L — AB (ref 14–54)
AST: 200 U/L — ABNORMAL HIGH (ref 15–41)
Albumin: 3.7 g/dL (ref 3.5–5.0)
Anion gap: 11 (ref 5–15)
BILIRUBIN TOTAL: 1.1 mg/dL (ref 0.3–1.2)
BUN: 10 mg/dL (ref 6–20)
CALCIUM: 9 mg/dL (ref 8.9–10.3)
CO2: 27 mmol/L (ref 22–32)
CREATININE: 0.36 mg/dL — AB (ref 0.44–1.00)
Chloride: 95 mmol/L — ABNORMAL LOW (ref 101–111)
Glucose, Bld: 120 mg/dL — ABNORMAL HIGH (ref 65–99)
Potassium: 2.9 mmol/L — ABNORMAL LOW (ref 3.5–5.1)
Sodium: 133 mmol/L — ABNORMAL LOW (ref 135–145)
TOTAL PROTEIN: 7.6 g/dL (ref 6.5–8.1)

## 2016-01-17 LAB — CBC
HCT: 33.9 % — ABNORMAL LOW (ref 36.0–46.0)
Hemoglobin: 11.6 g/dL — ABNORMAL LOW (ref 12.0–15.0)
MCH: 30.5 pg (ref 26.0–34.0)
MCHC: 34.2 g/dL (ref 30.0–36.0)
MCV: 89.2 fL (ref 78.0–100.0)
PLATELETS: 171 10*3/uL (ref 150–400)
RBC: 3.8 MIL/uL — AB (ref 3.87–5.11)
RDW: 16.6 % — ABNORMAL HIGH (ref 11.5–15.5)
WBC: 9.8 10*3/uL (ref 4.0–10.5)

## 2016-01-17 LAB — LIPASE, BLOOD: Lipase: 15 U/L (ref 11–51)

## 2016-01-17 LAB — ETHANOL: Alcohol, Ethyl (B): 91 mg/dL — ABNORMAL HIGH (ref <5)

## 2016-01-17 MED ORDER — SODIUM CHLORIDE 0.9 % IV SOLN
1000.0000 mL | Freq: Once | INTRAVENOUS | Status: AC
  Administered 2016-01-17: 1000 mL via INTRAVENOUS

## 2016-01-17 MED ORDER — ONDANSETRON HCL 4 MG/2ML IJ SOLN
4.0000 mg | Freq: Once | INTRAMUSCULAR | Status: AC
  Administered 2016-01-17: 4 mg via INTRAVENOUS
  Filled 2016-01-17: qty 2

## 2016-01-17 MED ORDER — ONDANSETRON 8 MG PO TBDP: 8.0000 mg | ORAL_TABLET | Freq: Three times a day (TID) | ORAL | Status: DC | PRN

## 2016-01-17 MED ORDER — CLONAZEPAM 0.5 MG PO TABS
0.5000 mg | ORAL_TABLET | Freq: Once | ORAL | Status: AC
  Administered 2016-01-17: 0.5 mg via ORAL
  Filled 2016-01-17: qty 1

## 2016-01-17 MED ORDER — METOCLOPRAMIDE HCL 5 MG/ML IJ SOLN
10.0000 mg | Freq: Once | INTRAMUSCULAR | Status: AC
  Administered 2016-01-17: 10 mg via INTRAVENOUS
  Filled 2016-01-17: qty 2

## 2016-01-17 MED ORDER — SODIUM CHLORIDE 0.9 % IV SOLN
1000.0000 mL | INTRAVENOUS | Status: DC
  Administered 2016-01-17: 1000 mL via INTRAVENOUS

## 2016-01-17 MED ORDER — POTASSIUM CHLORIDE CRYS ER 20 MEQ PO TBCR
40.0000 meq | EXTENDED_RELEASE_TABLET | Freq: Once | ORAL | Status: AC
  Administered 2016-01-17: 40 meq via ORAL
  Filled 2016-01-17: qty 2

## 2016-01-17 NOTE — Discharge Instructions (Signed)
Community Resource Guide Outpatient Counseling/Substance Abuse Adult The [employer] 211 is a great source of information about community services available.  Access by dialing 2-1-1 from anywhere in [place], or by website -  CustodianSupply.fi.   Other Local Resources (Updated 08/2015)  [employer] [facility]  Crisis Hotline, available 24 hours a day, 7 days a week: [place], [place]   [facility]  Crisis Hotline, available 24 hours a day, 7 days a week: [place], [place]  [facility]  Suicide Prevention Hotline, available 24 hours a day, 7 days a week: [place], [place], available 24 hours a day, 7 days a week: [facility], [place] Access to [employer], available 24 hours a day, 7 days a week: ([phone] 075-9094 All   [facility]  Crisis Hotline, available 24 hours a day, 7 days a week: 984-806-3996 All   Other Local Resources (Updated 08/2015)  Outpatient Counseling/ Substance Abuse Programs  Services     Address and Phone Number  ADS ([facility]cohol and Drug Services)   Options include Individual counseling, group counseling, intensive outpatient program (several hours a day, several days a week)  Offers depression assessments  Provides methadone maintenance program [phone] [address] [place], [place] partial hospitalization/day treatment and DUI/DWI programs  [employer], private insurance [phone] [address] [place], [place] [address]  [place] include intensive outpatient program (several hours a day, several days a week), outpatient treatment, DUI/DWI services, family education  Also has some services specifically for [employer] transitional housing   [phone] [address] [place], [place] [address]     [place] Medicare, private pay, and private insurance [phone] [address], [place], [place] [address]  [facility]  Services include individual counseling, substance abuse intensive outpatient program (several hours a day, several days a week), day treatment  Blinda Leatherwood, Medicaid, private insurance [phone] [address], [place], [place] [address]  [facility]   Offers substance abuse intensive outpatient program (several hours a day, several days a week), partial hospitalization program [phone] [address] [place], [place] [address]  [phone] [address], [place] [address]  [phone] [place], [place] [address]  [phone], [place], [place] [address]  [facility]  Individual counseling only  Accepts private insurance only [phone] [address] [place], [place] [address]  [facility]  Methadone maintenance program [phone] [address], [place] [address]  [facility] providing substance abuse and mental health counseling  Accepts Medicaid, Medicare, private insurance  Offers sliding scale for uninsured [phone] [place], [place] in [place] individual counseling, and intensive in-home services [phone] [address] [place], [place] [address]  [facility] individual counseling, family counseling, group therapy, domestic violence counseling, consumer credit counseling  Accepts Medicare, Medicaid, private insurance  Offers sliding scale for uninsured [phone] [address] [place], [place] [address]  [phone] [facility], [address] [place], [place]  [facility]  Offers individual, family  and group counseling  3 locations - [place], [place], and [place]  [place] [address], [place] [address]  [address] [place], [place] [address]  [place], [place] [address]  [facility]    Offers psychiatric assessment, 8-week Intensive Outpatient Program (several hours a day, several times a week, daytime or evenings), early recovery group, family Program, medication management  Private pay or private insurance only [phone], or  [phone] [address] [place], [place] [address]  [facility]  Offers individual, couples and family counseling  Accepts Medicaid, private insurance, and sliding scale for uninsured [phone] [address], [place] [address]  Launa Flight, MD  Individual counseling  Private insurance [phone] [place], [place] [address]  [facility]   Offers assessment, substance abuse treatment, and behavioral health treatment [phone] [address] [place], [place] [address]  [place]  Individual counseling  Accepts private insurance [phone] [place], [place] [address]  [facility]  Individual counseling  Blinda Leatherwood, private insurance [phone] [place], [place] [address]  [place]    Offers intensive outpatient program (several hours a day, several times a week)  Private pay, private insurance [phone] [place], [place]  Individual counseling  Medicare, private insurance [phone] [address] [place], [place] [address]  [facility]    Offers intensive outpatient program (several hours a day, several times a week) and partial hospitalization  program [phone] [place], [place] [address]  Letta Moynahan, MD  Individual counseling [phone] [address] [place], [place] [address]  [place] counseling to individuals, couples, and families  Accepts Medicare and private insurance; offers sliding scale for uninsured [phone] [place], [place] [address]  [facility]  Christian counseling [phone] [address], [place], [place] [address]  [facility] [employer] crisis counseling, individual counseling, group therapy, in-home therapy, domestic violence services, day treatment, DWI services, Conservation officer, nature (CST), Assertive Community Treatment Team (ACTT), substance abuse Intensive Outpatient Program (several hours a day, several times a week)  2 locations - [place] and [place], [place] [address]  [phone] [address] [place], [place] [address]  [place] counseling and group therapy  [place] insurance, [place], [place] [phone] [address] [place], [place]  [facility]  Offers individual and family counseling  Offers LGBTQ services  Accepts private insurance and private pay [phone] [place], [place] [address]  [facility]    Offers individual counseling, group therapy, and outpatient detox  Accepts private insurance [phone] [place], [place] Medicare, private insurance [phone] [address] [place], [place] [address]  [employer]  Individual counseling  Accepts Medicare, private insurance [phone] [address], [place] [address]  [facility] [place] substance abuse  Intensive Outpatient Program (several hours a day, several times a week) [phone], or [phone] [place], [place]

## 2016-01-17 NOTE — ED Provider Notes (Signed)
CSN: [id number]     Arrival date & time 01/17/16  1201 History   First MD Initiated Contact with Patient 01/17/16 1332     Chief Complaint  Patient presents with  . Rash  . Nausea  . Emesis     HPI Patient presents the emergency department with ongoing nausea vomiting and alcohol abuse.  She continues to drink daily despite her pregnancy.  She denies diarrhea.  No fevers or chills.  She was recently treated as an inpatient beginning of May 2017 for inpatient detox of her alcohol abuse.  She is back to drinking again.  She reports drinking several 24 ounce beers daily.  She denies vaginal bleeding or loss of fluid.    Past Medical History  Diagnosis Date  . Depression with anxiety initally at age 68  . Chlamydia 2007    bacterial vaginosis 09/2011  . Irregular heart beat 2010    wt/diet related after evaluation  . Renal disorder   . Pneumothorax, spontaneous, tension   . Alcohol abuse 11/2014    drinking since age 69. chronic, recurrent. at least 2 detox admits before 2015.   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   . Failure to thrive in adult 12/2014.     Malnutrition: n/v, not eating, weight loss, BMI 14.  s/p 01/11/2015 PEG (Dr Dorna Leitz).   . Alcoholic hepatitis 11/6268    hepatic steatosis on 11/2014 ultrasound.   . Seizure ([place]) 06/2015    due to ETOH/benzo withdrawal.   . Thrombocytopenia ([place]) 04/2007  . Colitis 12/2014    colonoscopy for diarrhea and abnml CT 01/06/15: erythmatous TI (path: active ileitis, ? emerging IBD), rectal erythema (path: mucosal prolapse). Random bx of normal colon (path unremarkable)   . Spontaneous pneumothorax 08/2012    right.  chest tube placed.   . Breast cancer [facility])     age 39, lump removed from left breast  . Congenital deafness     left ear only.  Also noted in mother and sibling (unilateral).    Past Surgical History  Procedure Laterality Date  . Cesarean section  07/2009     x 1.  G3, Para 2012.   . Chest tube insertion    .  Esophagogastroduodenoscopy N/A 01/06/2015    ;[facility], Dr Rayann Heman. For wt loss, N/V: Normal study, duodenal biopsy/pathology: chronic active duodenitis.   . Colonoscopy with propofol N/A 01/06/2015    [facility], Dr Rayann Heman. colonoscopy for diarrhea and abnml CT 01/06/15: erythmatous TI (path: active ileitis, ? emerging IBD), rectal erythema (path: mucosal prolapse). Random bx of normal colon (path unremarkable)   . Peg placement N/A 01/11/2015    [facility], Dr Rayann Heman.  for N/V/wt loss/severe malnutrition.   . Esophagogastroduodenoscopy N/A 01/11/2015    Rein-normal with PEG placement   Family History  Problem Relation Age of Onset  . Anesthesia problems Neg Hx   . Thyroid disease Mother   . Cancer Mother     breast cancer  . Cancer Father     lung cancer  . Stroke Other   . Crohn's disease Brother   . Cancer Maternal Grandmother     breast cancer   Social History  Substance Use Topics  . Smoking status: Current Every Day Smoker -- 2.00 packs/day    Types: Cigarettes  . Smokeless tobacco: Never Used  . Alcohol Use: 21.6 oz/week    36 Cans of beer per week     Comment: 6 beers/day; trying  to quit, participating in [place]   OB History    Gravida Para Term Preterm AB TAB SAB Ectopic Multiple Living   6 2 2  0 3 1 2  0 0 2     Review of Systems  All other systems reviewed and are negative.     Allergies  Aspirin; Effexor; Sulfa antibiotics; Tetracyclines & related; Trazodone and nefazodone; Latex; Paxil; Seroquel; and Zoloft  Home Medications   Prior to Admission medications   Medication Sig Start Date End Date Taking? Authorizing Provider  clonazePAM (KLONOPIN) 0.25 MG disintegrating tablet Take 0.25 mg by mouth 2 (two) times daily.   Yes Historical Provider, MD  ondansetron (ZOFRAN) 4 MG tablet Take 1 tablet (4 mg total) by mouth every 6 (six) hours as needed for nausea. 01/02/16  Yes Debbe Odea, MD  Prenatal Vit-Fe Fumarate-FA (PRENATAL MULTIVITAMIN) TABS tablet Take 1 tablet by mouth daily.   Yes  Historical Provider, MD  ondansetron (ZOFRAN ODT) 8 MG disintegrating tablet Take 1 tablet (8 mg total) by mouth every 8 (eight) hours as needed for nausea or vomiting. 01/17/16   Jola Schmidt, MD   BP 128/97 mmHg  Pulse 135  Temp(Src) 98.7 F (37.1 C) (Oral)  Resp 18  SpO2 96%  LMP 07/28/2015 (Approximate) Physical Exam  Constitutional: She is oriented to person, place, and time. She appears well-developed and well-nourished. No distress.  HENT:  Head: Normocephalic and atraumatic.  Eyes: EOM are normal.  Neck: Normal range of motion.  Cardiovascular: Regular rhythm and normal heart sounds.   Tachycardic.  Pulmonary/Chest: Effort normal and breath sounds normal.  Abdominal: Soft. She exhibits no distension. There is no tenderness.  Musculoskeletal: Normal range of motion.  Neurological: She is alert and oriented to person, place, and time.  Skin: Skin is warm and dry.  Psychiatric: She has a normal mood and affect. Judgment normal.  Nursing note and vitals reviewed.   ED Course  Procedures (including critical care time) Labs Review Labs Reviewed  COMPREHENSIVE METABOLIC PANEL - Abnormal; Notable for the following:    Sodium 133 (*)    Potassium 2.9 (*)    Chloride 95 (*)    Glucose, Bld 120 (*)    Creatinine, Ser 0.36 (*)    AST 200 (*)    ALT 90 (*)    Alkaline Phosphatase 149 (*)    All other components within normal limits  CBC - Abnormal; Notable for the following:    RBC 3.80 (*)    Hemoglobin 11.6 (*)    HCT 33.9 (*)    RDW 16.6 (*)    All other components within normal limits  ETHANOL - Abnormal; Notable for the following:    Alcohol, Ethyl (B) 91 (*)    All other components within normal limits  LIPASE, BLOOD  URINALYSIS, ROUTINE W REFLEX MICROSCOPIC (NOT AT [facility])    Imaging Review No results found. I have personally reviewed and evaluated these images and lab results as part of my medical decision-making.   EKG Interpretation None      MDM    Final diagnoses:  Nausea and vomiting, vomiting of unspecified type  Alcohol abuse  Intrauterine pregnancy    Patient feels much better after IV fluids.  Patient has clonazepam at home which is been okayed by her obstetrician as her obstetrician thinks that the benefit outweighs the risk given her ongoing alcohol abuse.  Vas that she stop drinking alcohol and to use her and denies pains to help her with  withdrawal symptoms.  She will follow-up as an outpatient with her OB in her primary care doctor.  Outpatient resources given for her alcohol abuse.  Home with Zofran where again I think that the benefit outweighs the risk    Jola Schmidt, MD 01/17/16 1557

## 2016-01-17 NOTE — ED Notes (Signed)
Patient here with complaint of rash to right leg. Nausea/vomiting. [redacted] weeks pregnant. Alcohol yesterday.
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
## 2016-01-19 ENCOUNTER — Encounter (HOSPITAL_COMMUNITY): Payer: Self-pay

## 2016-01-19 ENCOUNTER — Inpatient Hospital Stay (HOSPITAL_COMMUNITY)
Admission: AD | Admit: 2016-01-19 | Discharge: 2016-01-20 | Disposition: A | Discharge status: Home / Self Care | Payer: Medicaid Other | Source: Ambulatory Visit | Attending: Obstetrics & Gynecology | Admitting: Obstetrics & Gynecology

## 2016-01-19 DIAGNOSIS — B9689 Other specified bacterial agents as the cause of diseases classified elsewhere: Secondary | ICD-10-CM | POA: Insufficient documentation

## 2016-01-19 DIAGNOSIS — O210 Mild hyperemesis gravidarum: Secondary | ICD-10-CM | POA: Insufficient documentation

## 2016-01-19 DIAGNOSIS — F1010 Alcohol abuse, uncomplicated: Secondary | ICD-10-CM

## 2016-01-19 DIAGNOSIS — Z3A13 13 weeks gestation of pregnancy: Secondary | ICD-10-CM | POA: Insufficient documentation

## 2016-01-19 DIAGNOSIS — O23591 Infection of other part of genital tract in pregnancy, first trimester: Secondary | ICD-10-CM | POA: Insufficient documentation

## 2016-01-19 DIAGNOSIS — K7010 Alcoholic hepatitis without ascites: Secondary | ICD-10-CM | POA: Insufficient documentation

## 2016-01-19 DIAGNOSIS — K920 Hematemesis: Secondary | ICD-10-CM

## 2016-01-19 DIAGNOSIS — O99312 Alcohol use complicating pregnancy, second trimester: Secondary | ICD-10-CM

## 2016-01-19 DIAGNOSIS — R109 Unspecified abdominal pain: Secondary | ICD-10-CM

## 2016-01-19 DIAGNOSIS — N760 Acute vaginitis: Secondary | ICD-10-CM | POA: Insufficient documentation

## 2016-01-19 DIAGNOSIS — O99611 Diseases of the digestive system complicating pregnancy, first trimester: Secondary | ICD-10-CM | POA: Insufficient documentation

## 2016-01-19 DIAGNOSIS — O26899 Other specified pregnancy related conditions, unspecified trimester: Secondary | ICD-10-CM

## 2016-01-19 DIAGNOSIS — F1020 Alcohol dependence, uncomplicated: Secondary | ICD-10-CM | POA: Insufficient documentation

## 2016-01-19 DIAGNOSIS — K529 Noninfective gastroenteritis and colitis, unspecified: Secondary | ICD-10-CM

## 2016-01-19 DIAGNOSIS — O99311 Alcohol use complicating pregnancy, first trimester: Secondary | ICD-10-CM | POA: Insufficient documentation

## 2016-01-19 DIAGNOSIS — F17210 Nicotine dependence, cigarettes, uncomplicated: Secondary | ICD-10-CM | POA: Insufficient documentation

## 2016-01-19 DIAGNOSIS — O99331 Smoking (tobacco) complicating pregnancy, first trimester: Secondary | ICD-10-CM | POA: Insufficient documentation

## 2016-01-19 NOTE — MAU Note (Signed)
Patient presents with c/o of abdominal pain for that past 3 days and nausea and vomiting for the past 3 days.

## 2016-01-20 ENCOUNTER — Encounter (HOSPITAL_COMMUNITY): Payer: Self-pay | Admitting: Advanced Practice Midwife

## 2016-01-20 DIAGNOSIS — K529 Noninfective gastroenteritis and colitis, unspecified: Secondary | ICD-10-CM | POA: Diagnosis not present

## 2016-01-20 DIAGNOSIS — O99311 Alcohol use complicating pregnancy, first trimester: Secondary | ICD-10-CM | POA: Diagnosis not present

## 2016-01-20 DIAGNOSIS — O99331 Smoking (tobacco) complicating pregnancy, first trimester: Secondary | ICD-10-CM | POA: Diagnosis not present

## 2016-01-20 DIAGNOSIS — R109 Unspecified abdominal pain: Secondary | ICD-10-CM

## 2016-01-20 DIAGNOSIS — O9989 Other specified diseases and conditions complicating pregnancy, childbirth and the puerperium: Secondary | ICD-10-CM | POA: Diagnosis not present

## 2016-01-20 DIAGNOSIS — Z3A13 13 weeks gestation of pregnancy: Secondary | ICD-10-CM | POA: Diagnosis not present

## 2016-01-20 DIAGNOSIS — F1020 Alcohol dependence, uncomplicated: Secondary | ICD-10-CM | POA: Diagnosis not present

## 2016-01-20 DIAGNOSIS — O23591 Infection of other part of genital tract in pregnancy, first trimester: Secondary | ICD-10-CM | POA: Diagnosis not present

## 2016-01-20 DIAGNOSIS — K7010 Alcoholic hepatitis without ascites: Secondary | ICD-10-CM | POA: Diagnosis not present

## 2016-01-20 DIAGNOSIS — N760 Acute vaginitis: Secondary | ICD-10-CM | POA: Diagnosis not present

## 2016-01-20 DIAGNOSIS — R1030 Lower abdominal pain, unspecified: Secondary | ICD-10-CM | POA: Diagnosis present

## 2016-01-20 DIAGNOSIS — F17210 Nicotine dependence, cigarettes, uncomplicated: Secondary | ICD-10-CM | POA: Diagnosis not present

## 2016-01-20 DIAGNOSIS — K920 Hematemesis: Secondary | ICD-10-CM | POA: Diagnosis not present

## 2016-01-20 DIAGNOSIS — O99611 Diseases of the digestive system complicating pregnancy, first trimester: Secondary | ICD-10-CM | POA: Diagnosis not present

## 2016-01-20 DIAGNOSIS — B9689 Other specified bacterial agents as the cause of diseases classified elsewhere: Secondary | ICD-10-CM | POA: Diagnosis not present

## 2016-01-20 DIAGNOSIS — O210 Mild hyperemesis gravidarum: Secondary | ICD-10-CM | POA: Diagnosis not present

## 2016-01-20 LAB — WET PREP, GENITAL
SPERM: NONE SEEN
Trich, Wet Prep: NONE SEEN
Yeast Wet Prep HPF POC: NONE SEEN

## 2016-01-20 LAB — GC/CHLAMYDIA PROBE AMP (~~LOC~~) NOT AT ARMC
CHLAMYDIA, DNA PROBE: NEGATIVE
NEISSERIA GONORRHEA: NEGATIVE

## 2016-01-20 LAB — CBC
HCT: 28.5 % — ABNORMAL LOW (ref 36.0–46.0)
Hemoglobin: 9.6 g/dL — ABNORMAL LOW (ref 12.0–15.0)
MCH: 30.4 pg (ref 26.0–34.0)
MCHC: 33.7 g/dL (ref 30.0–36.0)
MCV: 90.2 fL (ref 78.0–100.0)
PLATELETS: 181 10*3/uL (ref 150–400)
RBC: 3.16 MIL/uL — ABNORMAL LOW (ref 3.87–5.11)
RDW: 16.9 % — AB (ref 11.5–15.5)
WBC: 15.1 10*3/uL — ABNORMAL HIGH (ref 4.0–10.5)

## 2016-01-20 LAB — ETHANOL: ALCOHOL ETHYL (B): 180 mg/dL — AB (ref <5)

## 2016-01-20 LAB — COMPREHENSIVE METABOLIC PANEL
ALT: 52 U/L (ref 14–54)
ANION GAP: 12 (ref 5–15)
AST: 106 U/L — ABNORMAL HIGH (ref 15–41)
Albumin: 3.1 g/dL — ABNORMAL LOW (ref 3.5–5.0)
Alkaline Phosphatase: 146 U/L — ABNORMAL HIGH (ref 38–126)
BUN: 8 mg/dL (ref 6–20)
CALCIUM: 8.2 mg/dL — AB (ref 8.9–10.3)
CHLORIDE: 102 mmol/L (ref 101–111)
CO2: 23 mmol/L (ref 22–32)
Glucose, Bld: 90 mg/dL (ref 65–99)
Potassium: 3.1 mmol/L — ABNORMAL LOW (ref 3.5–5.1)
SODIUM: 137 mmol/L (ref 135–145)
Total Bilirubin: 0.8 mg/dL (ref 0.3–1.2)
Total Protein: 6.5 g/dL (ref 6.5–8.1)

## 2016-01-20 LAB — URINE MICROSCOPIC-ADD ON: WBC UA: NONE SEEN WBC/hpf (ref 0–5)

## 2016-01-20 LAB — URINALYSIS, ROUTINE W REFLEX MICROSCOPIC
Bilirubin Urine: NEGATIVE
GLUCOSE, UA: NEGATIVE mg/dL
Ketones, ur: NEGATIVE mg/dL
LEUKOCYTES UA: NEGATIVE
NITRITE: NEGATIVE
PH: 6 (ref 5.0–8.0)
Protein, ur: NEGATIVE mg/dL
SPECIFIC GRAVITY, URINE: 1.015 (ref 1.005–1.030)

## 2016-01-20 LAB — RAPID URINE DRUG SCREEN, HOSP PERFORMED
AMPHETAMINES: NOT DETECTED
Barbiturates: NOT DETECTED
Benzodiazepines: POSITIVE — AB
COCAINE: NOT DETECTED
OPIATES: NOT DETECTED
TETRAHYDROCANNABINOL: NOT DETECTED

## 2016-01-20 MED ORDER — PROMETHAZINE HCL 25 MG PO TABS
25.0000 mg | ORAL_TABLET | Freq: Once | ORAL | Status: AC
  Administered 2016-01-20: 25 mg via ORAL
  Filled 2016-01-20: qty 1

## 2016-01-20 MED ORDER — METRONIDAZOLE 0.75 % VA GEL: 1.0000 | Freq: Every day | VAGINAL | Status: DC

## 2016-01-20 MED ORDER — PANTOPRAZOLE SODIUM 40 MG PO TBEC: 40.0000 mg | DELAYED_RELEASE_TABLET | Freq: Every day | ORAL | Status: DC

## 2016-01-20 MED ORDER — PROMETHAZINE HCL 25 MG PO TABS: 25.0000 mg | ORAL_TABLET | Freq: Four times a day (QID) | ORAL | Status: DC | PRN

## 2016-01-20 MED ORDER — SUCRALFATE 1 GM/10ML PO SUSP: 1.0000 g | Freq: Three times a day (TID) | ORAL | Status: DC

## 2016-01-20 NOTE — MAU Provider Note (Signed)
Chief Complaint: Abdominal Pain   First Provider Initiated Contact with Patient 01/20/16 0147     SUBJECTIVE HPI: Kristine Macias is a 30 y.o. [id number] at 33w3dwho presents to [facility] by EMS reporting low abdominal pain, nausea, vomiting and diarrhea 3 days. Was seen in the ED 01/16/2014 for the same complaints. Has complex medical history including alcohol abuse, alcoholic hepatitis, frequent ED visits for nausea and vomiting.  Has been taking Zofran for nausea and vomiting with no improvement.  Has confirmed live IUP per ultrasound 12/07/2014  Location: Suprapubic Quality: Sharp Severity: 7/10 on pain scale Duration: 3 days Context: With nausea vomiting diarrhea Timing: Intermittent Modifying factors: No improvement with Zofran Associated signs and symptoms: Positive for vomiting "chunks of blood" today, diarrhea. Negative for fever, chills, vaginal bleeding, vaginal discharge, urinary complaints.  Past Medical History  Diagnosis Date  . Depression with anxiety initally at age 30 . Chlamydia 2007    bacterial vaginosis 09/2011  . Irregular heart beat 2010    wt/diet related after evaluation  . Renal disorder   . Pneumothorax, spontaneous, tension   . Alcohol abuse 11/2014    drinking since age 30 chronic, recurrent. at least 2 detox admits before 2015.   .Marland KitchenPancreas divisum 02/2015    Type 1 seen on CT.   . Failure to thrive in adult 12/2014.     Malnutrition: n/v, not eating, weight loss, BMI 14.  s/p 01/11/2015 PEG (Dr WDorna Leitz.   . Alcoholic hepatitis 75/7017   hepatic steatosis on 11/2014 ultrasound.   . [place]    due to ETOH/benzo withdrawal.   . [place]  . Colitis 12/2014    colonoscopy for diarrhea and abnml CT 01/06/15: erythmatous TI (path: active ileitis, ? emerging IBD), rectal erythema (path: mucosal prolapse). Random bx of normal colon (path unremarkable)   . Spontaneous pneumothorax 08/2012    right.  chest tube  placed.   . Breast cancer ([facility]     age 30 lump removed from left breast  . Congenital deafness     left ear only.  Also noted in mother and sibling (unilateral).    OB History  Gravida Para Term Preterm AB SAB TAB Ectopic Multiple Living  6 2 2  0 3 2 1  0 0 2    # Outcome Date GA Lbr Len/2nd Weight Sex Delivery Anes PTL Lv  6 Current           5 SAB 2015          4 TAB 2012          3 Term 2010 457w0d  CS-LTranv  N Y     Complications: Failure to Progress in Second Stage  2 Term 2008 [id number] Vag-Spont   Y  1 SAB              Past Surgical History  Procedure Laterality Date  . Cesarean section  07/2009     x 1.  G3, Para 2012.   . Chest tube insertion    . Esophagogastroduodenoscopy N/A 01/06/2015    ;[facility], Dr ReiRayann Hemanor wt loss, N/V: Normal study, duodenal biopsy/pathology: chronic active duodenitis.   . Colonoscopy with propofol N/A 01/06/2015    [facility], Dr ReiRayann Hemanolonoscopy for diarrhea and abnml CT 01/06/15: erythmatous TI (path: active ileitis, ? emerging IBD), rectal erythema (path: mucosal prolapse). Random bx of normal colon (path unremarkable)   . Peg placement N/A  01/11/2015    [facility], Dr Rayann Heman.  for N/V/wt loss/severe malnutrition.   . Esophagogastroduodenoscopy N/A 01/11/2015    Rein-normal with PEG placement   Social History   Social History  . Marital Status: Single    Spouse Name: N/A  . Number of Children: N/A  . Years of Education: N/A   Occupational History  . Not on file.   Social History Main Topics  . Smoking status: Current Every Day Smoker -- 2.00 packs/day    Types: Cigarettes  . Smokeless tobacco: Never Used  . Alcohol Use: 21.6 oz/week    36 Cans of beer per week     Comment: 6 beers/day; trying to quit, participating in [place]  . Drug Use: No  . Sexual Activity: Yes    Birth Control/ Protection: None   Other Topics Concern  . Not on file   Social History Narrative   Lives with mother & 2 children   disabled   No current  facility-administered medications on file prior to encounter.   Current Outpatient Prescriptions on File Prior to Encounter  Medication Sig Dispense Refill  . clonazePAM (KLONOPIN) 0.25 MG disintegrating tablet Take 0.25 mg by mouth 2 (two) times daily.  1  . ondansetron (ZOFRAN ODT) 8 MG disintegrating tablet Take 1 tablet (8 mg total) by mouth every 8 (eight) hours as needed for nausea or vomiting. 12 tablet 0  . ondansetron (ZOFRAN) 4 MG tablet Take 1 tablet (4 mg total) by mouth every 6 (six) hours as needed for nausea. 60 tablet 2  . Prenatal Vit-Fe Fumarate-FA (PRENATAL MULTIVITAMIN) TABS tablet Take 1 tablet by mouth daily.     Allergies  Allergen Reactions  . Aspirin Shortness Of Breath  . Effexor [Venlafaxine] Anaphylaxis  . Sulfa Antibiotics Anaphylaxis  . Tetracyclines & Related Anaphylaxis  . Trazodone And Nefazodone Anaphylaxis  . Latex Hives and Itching  . Paxil [Paroxetine] Hives and Itching  . Seroquel [Quetiapine Fumarate] Hives and Itching  . Zoloft [Sertraline Hcl] Hives and Itching    I have reviewed the past Medical Hx, Surgical Hx, Social Hx, Allergies and Medications.   Review of Systems  Constitutional: Negative for fever, chills and appetite change.  Gastrointestinal: Positive for nausea, vomiting, abdominal pain and diarrhea. Negative for constipation, blood in stool and abdominal distention.  Genitourinary: Negative for dysuria, hematuria, flank pain, vaginal bleeding and vaginal discharge.  Musculoskeletal: Negative for back pain.  Skin: Positive for rash.  Neurological: Negative for dizziness.    OBJECTIVE Patient Vitals for the past 24 hrs:  BP Temp Temp src Pulse Resp SpO2  01/20/16 0317 99/68 mmHg - - 112 18 -  01/19/16 2253 114/79 mmHg 98.4 F (36.9 C) Oral (!) 121 18 95 %   Constitutional: Well-developed, Underweight female in no acute distress. No pallor. Cardiovascular: Mild tachycardia.  Respiratory: normal rate and effort. Clear to  auscultation bilaterally. Positive cough. GI: Abd soft, mild suprapubic tenderness. Negative for guarding, rebound tenderness or mass side from fundus, gravid appropriate for gestational age. Pos BS x 4 MS: Extremities nontender, no edema, normal ROM. Entire back mildly tender to palpation bilaterally. Skin: Scattered 1-2 mm pink, raised lesions on a flat pink bed on low abdomen and bilateral legs. Attempted to culture, but no fluid-filled vesicles or open lesions. Non-erythematous. Intensely pruritic. Neurologic: Alert and oriented x 4. Possibly intoxicated. GU: Neg CVAT.  SPECULUM EXAM: NEFG, small amount of thin, yellow, mildly malodorous discharge, no blood noted, cervix clean  BIMANUAL: cervix closed and  long; uterus 13-14 week size, no adnexal tenderness or masses. No CMT.  LAB RESULTS Results for orders placed or performed during the hospital encounter of 01/19/16 (from the past 24 hour(s))  CBC     Status: Abnormal   Collection Time: 01/20/16 12:12 AM  Result Value Ref Range   WBC 15.1 (H) 4.0 - 10.5 K/uL   RBC 3.16 (L) 3.87 - 5.11 MIL/uL   Hemoglobin 9.6 (L) 12.0 - 15.0 g/dL   HCT 28.5 (L) 36.0 - 46.0 %   MCV 90.2 78.0 - 100.0 fL   MCH 30.4 26.0 - 34.0 pg   MCHC 33.7 30.0 - 36.0 g/dL   RDW 16.9 (H) 11.5 - 15.5 %   Platelets 181 150 - 400 K/uL  Comprehensive metabolic panel     Status: Abnormal   Collection Time: 01/20/16 12:12 AM  Result Value Ref Range   Sodium 137 135 - 145 mmol/L   Potassium 3.1 (L) 3.5 - 5.1 mmol/L   Chloride 102 101 - 111 mmol/L   CO2 23 22 - 32 mmol/L   Glucose, Bld 90 65 - 99 mg/dL   BUN 8 6 - 20 mg/dL   Creatinine, Ser <0.30 (L) 0.44 - 1.00 mg/dL   Calcium 8.2 (L) 8.9 - 10.3 mg/dL   Total Protein 6.5 6.5 - 8.1 g/dL   Albumin 3.1 (L) 3.5 - 5.0 g/dL   AST 106 (H) 15 - 41 U/L   ALT 52 14 - 54 U/L   Alkaline Phosphatase 146 (H) 38 - 126 U/L   Total Bilirubin 0.8 0.3 - 1.2 mg/dL   GFR calc non Af Amer NOT CALCULATED >60 mL/min   GFR calc Af Amer  NOT CALCULATED >60 mL/min   Anion gap 12 5 - 15  Ethanol     Status: Abnormal   Collection Time: 01/20/16 12:12 AM  Result Value Ref Range   Alcohol, Ethyl (B) 180 (H) <5 mg/dL  Urine rapid drug screen (hosp performed)     Status: Abnormal   Collection Time: 01/20/16  1:15 AM  Result Value Ref Range   Opiates NONE DETECTED NONE DETECTED   Cocaine NONE DETECTED NONE DETECTED   Benzodiazepines POSITIVE (A) NONE DETECTED   Amphetamines NONE DETECTED NONE DETECTED   Tetrahydrocannabinol NONE DETECTED NONE DETECTED   Barbiturates NONE DETECTED NONE DETECTED  Urinalysis, Routine w reflex microscopic (not at [facility])     Status: Abnormal   Collection Time: 01/20/16  1:15 AM  Result Value Ref Range   Color, Urine YELLOW YELLOW   APPearance CLEAR CLEAR   Specific Gravity, Urine 1.015 1.005 - 1.030   pH 6.0 5.0 - 8.0   Glucose, UA NEGATIVE NEGATIVE mg/dL   Hgb urine dipstick TRACE (A) NEGATIVE   Bilirubin Urine NEGATIVE NEGATIVE   Ketones, ur NEGATIVE NEGATIVE mg/dL   Protein, ur NEGATIVE NEGATIVE mg/dL   Nitrite NEGATIVE NEGATIVE   Leukocytes, UA NEGATIVE NEGATIVE  Urine microscopic-add on     Status: Abnormal   Collection Time: 01/20/16  1:15 AM  Result Value Ref Range   Squamous Epithelial / LPF 0-5 (A) NONE SEEN   WBC, UA NONE SEEN 0 - 5 WBC/hpf   RBC / HPF 0-5 0 - 5 RBC/hpf   Bacteria, UA FEW (A) NONE SEEN  Wet prep, genital     Status: Abnormal   Collection Time: 01/20/16  1:36 AM  Result Value Ref Range   Yeast Wet Prep HPF POC NONE SEEN NONE SEEN   Trich,  Wet Prep NONE SEEN NONE SEEN   Clue Cells Wet Prep HPF POC PRESENT (A) NONE SEEN   WBC, Wet Prep HPF POC MANY (A) NONE SEEN   Sperm NONE SEEN     IMAGING US Ob Comp Less 14 Wks  12/30/2015  CLINICAL DATA:  30 year old pregnant female presenting with mid to left pelvic pain. Assigned EDC: 07/24/2016, projecting to an expected gestational age of [redacted] weeks 3 days. EXAM: OBSTETRIC <14 WK [place] AND TRANSVAGINAL OB US TECHNIQUE: Both  transabdominal and transvaginal ultrasound examinations were performed for complete evaluation of the gestation as well as the maternal uterus, adnexal regions, and pelvic cul-de-sac. Transvaginal technique was performed to assess early pregnancy. COMPARISON:  12/20/2015 obstetric scan. FINDINGS: Intrauterine gestational sac: Single intrauterine gestational sac appears normal in size, shape and position. Yolk sac:  Present. Fetus: Present. Fetal anatomy is not assessed at this early gestational age. Fetal Cardiac Activity: Regular rate and rhythm. Heart Rate: 152  bpm CRL:  39.4  mm   10 w   6 d                  [place] EDC: 07/21/2016 Subchorionic hemorrhage:  None visualized. Maternal uterus/adnexae: Right ovary measures 3.3 x 1.5 x 1.2 cm. Left ovary measures 1.8 x 1.1 x 1.2 cm. No ovarian or adnexal masses. No abnormal free fluid in the pelvis. IMPRESSION: 1. Single living intrauterine gestation at 10 weeks 6 days by crown-rump length, with appropriate interval fetal growth since 12/20/2015 scan. 2. No first-trimester gestational abnormality. Normal fetal cardiac activity. 3. No ovarian or adnexal abnormality. Electronically Signed   By: Ilona Sorrel M.D.   On: 12/30/2015 17:30   US Ob Transvaginal  12/30/2015  CLINICAL DATA:  31 year old pregnant female presenting with mid to left pelvic pain. Assigned EDC: 07/24/2016, projecting to an expected gestational age of [redacted] weeks 3 days. EXAM: OBSTETRIC <14 WK [place] AND TRANSVAGINAL OB US TECHNIQUE: Both transabdominal and transvaginal ultrasound examinations were performed for complete evaluation of the gestation as well as the maternal uterus, adnexal regions, and pelvic cul-de-sac. Transvaginal technique was performed to assess early pregnancy. COMPARISON:  12/20/2015 obstetric scan. FINDINGS: Intrauterine gestational sac: Single intrauterine gestational sac appears normal in size, shape and position. Yolk sac:  Present. Fetus: Present. Fetal anatomy is not assessed at  this early gestational age. Fetal Cardiac Activity: Regular rate and rhythm. Heart Rate: 152  bpm CRL:  39.4  mm   10 w   6 d                  [place] EDC: 07/21/2016 Subchorionic hemorrhage:  None visualized. Maternal uterus/adnexae: Right ovary measures 3.3 x 1.5 x 1.2 cm. Left ovary measures 1.8 x 1.1 x 1.2 cm. No ovarian or adnexal masses. No abnormal free fluid in the pelvis. IMPRESSION: 1. Single living intrauterine gestation at 10 weeks 6 days by crown-rump length, with appropriate interval fetal growth since 12/20/2015 scan. 2. No first-trimester gestational abnormality. Normal fetal cardiac activity. 3. No ovarian or adnexal abnormality. Electronically Signed   By: Ilona Sorrel M.D.   On: 12/30/2015 17:30    MAU COURSE UA, Phenergan, Wet prep, GC/chlamydia, UDS, EHOT level.  No vomiting or bleeding from mouth throughout entire MAU visit. Discussed history, exam, report of significant hematemesis, mildly elevated white blood cell count and decrease in hemoglobin 2 points since ED visit 01/17/2016 with Dr. Roselie Awkward. Since patient has not had any hematemesis or bleeding Dr. Roselie Awkward does not recommend  transfer to ED for further evaluation of these reports.   Lengthy discussion with patient about risks associated with alcohol use during pregnancy and possible link between alcoholism and her symptoms today. Explained to patient that there is no evidence of obstetric complication at this time but instructed her to follow-up at [place] for ongoing problems with nausea, vomiting, hematemesis or pain.  Nausea resolved. No further vomiting.  MDM - Low abdominal pain likely due to gastroenteritis. No evidence of obstetric complication. Patient is nontoxic-appearing.  - Reported hematemesis with 2 point decrease in hemoglobin since ED visit on 01/17/2016. No evidence of bleeding in MAU visit today.  - Chronic alcoholism with alcoholic hepatitis  - Mild leukocytosis of unknown etiology. Possibly normal for  pregnancy or possibly related to nausea, vomiting diarrhea.    - BV. Will treat with Flagyl.   ASSESSMENT 1. Abdominal pain affecting pregnancy, antepartum   2. BV (bacterial vaginosis)   3. Gastroenteritis   4. Hematemesis with nausea   5. Alcohol abuse affecting pregnancy in second trimester     PLAN Discharge home in stable condition. Abdominal pain Precautions Hematemesis precautions. Alcohol consumption in pregnancy precautions. Discussed fetal alcohol syndrome. GC/Chlamydia cultures pending. Rx Phenergan, MetroGel, Protonix and Carafate.  Follow-up with primary care he provider when necessary     Follow-up Information    Follow up with Rubie Maid, MD On 01/26/2016.   Specialties:  Obstetrics and Gynecology, Radiology   Why:  Routine prenatal visit   Contact information:   [place] [address] [phone]       Follow up with [place].   Specialty:  Emergency Medicine   Why:  As needed if symptoms worsen   Contact information:   [address] [id number] [place] [phone]       Medication List    STOP taking these medications        ondansetron 4 MG tablet  Commonly known as:  ZOFRAN      TAKE these medications        clonazePAM 0.25 MG disintegrating tablet  Commonly known as:  KLONOPIN  Take 0.25 mg by mouth 2 (two) times daily.     metroNIDAZOLE 0.75 % vaginal gel  Commonly known as:  METROGEL  Place 1 Applicatorful vaginally at bedtime. Apply one applicatorful to vagina at bedtime for 5 days     ondansetron 8 MG disintegrating tablet  Commonly known as:  ZOFRAN ODT  Take 1 tablet (8 mg total) by mouth every 8 (eight) hours as needed for nausea or vomiting.     pantoprazole 40 MG tablet  Commonly known as:  PROTONIX  Take 1 tablet (40 mg total) by mouth daily.     prenatal multivitamin Tabs tablet  Take 1 tablet by mouth daily.      promethazine 25 MG tablet  Commonly known as:  PHENERGAN  Take 1 tablet (25 mg total) by mouth every 6 (six) hours as needed.     sucralfate 1 GM/10ML suspension  Commonly known as:  CARAFATE  Take 10 mLs (1 g total) by mouth 4 (four) times daily -  with meals and at bedtime.         [place], CNM 01/20/2016  3:26 AM

## 2016-01-20 NOTE — Discharge Instructions (Signed)
Viral Gastroenteritis Viral gastroenteritis is also known as stomach flu. This condition affects the stomach and intestinal tract. It can cause sudden diarrhea and vomiting. The illness typically lasts 3 to 8 days. Most people develop an immune response that eventually gets rid of the virus. While this natural response develops, the virus can make you quite ill. CAUSES  Many different viruses can cause gastroenteritis, such as rotavirus or noroviruses. You can catch one of these viruses by consuming contaminated food or water. You may also catch a virus by sharing utensils or other personal items with an infected person or by touching a contaminated surface. SYMPTOMS  The most common symptoms are diarrhea and vomiting. These problems can cause a severe loss of body fluids (dehydration) and a body salt (electrolyte) imbalance. Other symptoms may include:  Fever.  Headache.  Fatigue.  Abdominal pain. DIAGNOSIS  Your caregiver can usually diagnose viral gastroenteritis based on your symptoms and a physical exam. A stool sample may also be taken to test for the presence of viruses or other infections. TREATMENT  This illness typically goes away on its own. Treatments are aimed at rehydration. The most serious cases of viral gastroenteritis involve vomiting so severely that you are not able to keep fluids down. In these cases, fluids must be given through an intravenous line (IV). HOME CARE INSTRUCTIONS   Drink enough fluids to keep your urine clear or pale yellow. Drink small amounts of fluids frequently and increase the amounts as tolerated.  Ask your caregiver for specific rehydration instructions.  Avoid:  Foods high in sugar.  Alcohol.  Carbonated drinks.  Tobacco.  Juice.  Caffeine drinks.  Extremely hot or cold fluids.  Fatty, greasy foods.  Too much intake of anything at one time.  Dairy products until 24 to 48 hours after diarrhea stops.  You may consume probiotics.  Probiotics are active cultures of beneficial bacteria. They may lessen the amount and number of diarrheal stools in adults. Probiotics can be found in yogurt with active cultures and in supplements.  Wash your hands well to avoid spreading the virus.  Only take over-the-counter or prescription medicines for pain, discomfort, or fever as directed by your caregiver. Do not give aspirin to children. Antidiarrheal medicines are not recommended.  Ask your caregiver if you should continue to take your regular prescribed and over-the-counter medicines.  Keep all follow-up appointments as directed by your caregiver. SEEK IMMEDIATE MEDICAL CARE IF:   You are unable to keep fluids down.  You do not urinate at least once every 6 to 8 hours.  You develop shortness of breath.  You notice blood in your stool or vomit. This may look like coffee grounds.  You have abdominal pain that increases or is concentrated in one small area (localized).  You have persistent vomiting or diarrhea.  You have a fever.  The patient is a child younger than 3 months, and he or she has a fever.  The patient is a child older than 3 months, and he or she has a fever and persistent symptoms.  The patient is a child older than 3 months, and he or she has a fever and symptoms suddenly get worse.  The patient is a baby, and he or she has no tears when crying. MAKE SURE YOU:   Understand these instructions.  Will watch your condition.  Will get help right away if you are not doing well or get worse.   This information is not intended to replace  advice given to you by your health care provider. Make sure you discuss any questions you have with your health care provider.   Document Released: 08/13/2005 Document Revised: 11/05/2011 Document Reviewed: 05/30/2011 Elsevier Interactive Patient Education 2016 Elsevier Inc.   Bacterial Vaginosis Bacterial vaginosis is a vaginal infection that occurs when the normal  balance of bacteria in the vagina is disrupted. It results from an overgrowth of certain bacteria. This is the most common vaginal infection in women of childbearing age. Treatment is important to prevent complications, especially in pregnant women, as it can cause a premature delivery. CAUSES  Bacterial vaginosis is caused by an increase in harmful bacteria that are normally present in smaller amounts in the vagina. Several different kinds of bacteria can cause bacterial vaginosis. However, the reason that the condition develops is not fully understood. RISK FACTORS Certain activities or behaviors can put you at an increased risk of developing bacterial vaginosis, including:  Having a new sex partner or multiple sex partners.  Douching.  Using an intrauterine device (IUD) for contraception. Women do not get bacterial vaginosis from toilet seats, bedding, swimming pools, or contact with objects around them. SIGNS AND SYMPTOMS  Some women with bacterial vaginosis have no signs or symptoms. Common symptoms include:  Grey vaginal discharge.  A fishlike odor with discharge, especially after sexual intercourse.  Itching or burning of the vagina and vulva.  Burning or pain with urination. DIAGNOSIS  Your health care provider will take a medical history and examine the vagina for signs of bacterial vaginosis. A sample of vaginal fluid may be taken. Your health care provider will look at this sample under a microscope to check for bacteria and abnormal cells. A vaginal pH test may also be done.  TREATMENT  Bacterial vaginosis may be treated with antibiotic medicines. These may be given in the form of a pill or a vaginal cream. A second round of antibiotics may be prescribed if the condition comes back after treatment. Because bacterial vaginosis increases your risk for sexually transmitted diseases, getting treated can help reduce your risk for chlamydia, gonorrhea, HIV, and herpes. HOME CARE  INSTRUCTIONS   Only take over-the-counter or prescription medicines as directed by your health care provider.  If antibiotic medicine was prescribed, take it as directed. Make sure you finish it even if you start to feel better.  Tell all sexual partners that you have a vaginal infection. They should see their health care provider and be treated if they have problems, such as a mild rash or itching.  During treatment, it is important that you follow these instructions:  Avoid sexual activity or use condoms correctly.  Do not douche.  Avoid alcohol as directed by your health care provider.  Avoid breastfeeding as directed by your health care provider. SEEK MEDICAL CARE IF:   Your symptoms are not improving after 3 days of treatment.  You have increased discharge or pain.  You have a fever. MAKE SURE YOU:   Understand these instructions.  Will watch your condition.  Will get help right away if you are not doing well or get worse. FOR MORE INFORMATION  Centers for Disease Control and Prevention, Division of STD Prevention: AppraiserFraud.fi American Sexual Health Association (ASHA): www.ashastd.org    This information is not intended to replace advice given to you by your health care provider. Make sure you discuss any questions you have with your health care provider.   Document Released: 08/13/2005 Document Revised: 09/03/2014 Document Reviewed: 03/25/2013 Elsevier  Interactive Patient Education 2016 Page.  Abdominal Pain During Pregnancy Abdominal pain is common in pregnancy. Most of the time, it does not cause harm. There are many causes of abdominal pain. Some causes are more serious than others. Some of the causes of abdominal pain in pregnancy are easily diagnosed. Occasionally, the diagnosis takes time to understand. Other times, the cause is not determined. Abdominal pain can be a sign that something is very wrong with the pregnancy, or the pain may have nothing  to do with the pregnancy at all. For this reason, always tell your health care provider if you have any abdominal discomfort. HOME CARE INSTRUCTIONS  Monitor your abdominal pain for any changes. The following actions may help to alleviate any discomfort you are experiencing:  Do not have sexual intercourse or put anything in your vagina until your symptoms go away completely.  Get plenty of rest until your pain improves.  Drink clear fluids if you feel nauseous. Avoid solid food as long as you are uncomfortable or nauseous.  Only take over-the-counter or prescription medicine as directed by your health care provider.  Keep all follow-up appointments with your health care provider. SEEK IMMEDIATE MEDICAL CARE IF:  You are bleeding, leaking fluid, or passing tissue from the vagina.  You have increasing pain or cramping.  You have persistent vomiting.  You have painful or bloody urination.  You have a fever.  You notice a decrease in your baby's movements.  You have extreme weakness or feel faint.  You have shortness of breath, with or without abdominal pain.  You develop a severe headache with abdominal pain.  You have abnormal vaginal discharge with abdominal pain.  You have persistent diarrhea.  You have abdominal pain that continues even after rest, or gets worse. MAKE SURE YOU:   Understand these instructions.  Will watch your condition.  Will get help right away if you are not doing well or get worse.   This information is not intended to replace advice given to you by your health care provider. Make sure you discuss any questions you have with your health care provider.   Document Released: 08/13/2005 Document Revised: 06/03/2013 Document Reviewed: 03/12/2013 Elsevier Interactive Patient Education [employer].

## 2016-01-25 ENCOUNTER — Encounter (HOSPITAL_COMMUNITY): Payer: Self-pay

## 2016-01-25 ENCOUNTER — Emergency Department (HOSPITAL_COMMUNITY): Payer: Medicaid Other

## 2016-01-25 ENCOUNTER — Inpatient Hospital Stay (HOSPITAL_COMMUNITY)
Admission: EM | Admit: 2016-01-25 | Discharge: 2016-02-02 | DRG: 781 | Disposition: A | Discharge status: Home Health | Payer: Medicaid Other | Attending: Internal Medicine | Admitting: Internal Medicine

## 2016-01-25 ENCOUNTER — Emergency Department (HOSPITAL_COMMUNITY)
Admission: EM | Admit: 2016-01-25 | Discharge: 2016-01-25 | Disposition: A | Discharge status: Home / Self Care | Payer: Medicaid Other | Source: Home / Self Care | Attending: Emergency Medicine | Admitting: Emergency Medicine

## 2016-01-25 DIAGNOSIS — Z3A16 16 weeks gestation of pregnancy: Secondary | ICD-10-CM

## 2016-01-25 DIAGNOSIS — O99012 Anemia complicating pregnancy, second trimester: Secondary | ICD-10-CM | POA: Diagnosis present

## 2016-01-25 DIAGNOSIS — K7010 Alcoholic hepatitis without ascites: Secondary | ICD-10-CM | POA: Diagnosis present

## 2016-01-25 DIAGNOSIS — Z888 Allergy status to other drugs, medicaments and biological substances status: Secondary | ICD-10-CM | POA: Diagnosis not present

## 2016-01-25 DIAGNOSIS — O219 Vomiting of pregnancy, unspecified: Secondary | ICD-10-CM

## 2016-01-25 DIAGNOSIS — F0630 Mood disorder due to known physiological condition, unspecified: Secondary | ICD-10-CM

## 2016-01-25 DIAGNOSIS — F329 Major depressive disorder, single episode, unspecified: Secondary | ICD-10-CM | POA: Insufficient documentation

## 2016-01-25 DIAGNOSIS — O99282 Endocrine, nutritional and metabolic diseases complicating pregnancy, second trimester: Secondary | ICD-10-CM | POA: Diagnosis present

## 2016-01-25 DIAGNOSIS — Z79899 Other long term (current) drug therapy: Secondary | ICD-10-CM

## 2016-01-25 DIAGNOSIS — R1084 Generalized abdominal pain: Secondary | ICD-10-CM | POA: Diagnosis present

## 2016-01-25 DIAGNOSIS — Y907 Blood alcohol level of 200-239 mg/100 ml: Secondary | ICD-10-CM | POA: Diagnosis present

## 2016-01-25 DIAGNOSIS — Z91040 Latex allergy status: Secondary | ICD-10-CM | POA: Insufficient documentation

## 2016-01-25 DIAGNOSIS — Z681 Body mass index (BMI) 19 or less, adult: Secondary | ICD-10-CM

## 2016-01-25 DIAGNOSIS — F410 Panic disorder [episodic paroxysmal anxiety] without agoraphobia: Secondary | ICD-10-CM | POA: Diagnosis present

## 2016-01-25 DIAGNOSIS — R197 Diarrhea, unspecified: Secondary | ICD-10-CM

## 2016-01-25 DIAGNOSIS — H9042 Sensorineural hearing loss, unilateral, left ear, with unrestricted hearing on the contralateral side: Secondary | ICD-10-CM | POA: Diagnosis present

## 2016-01-25 DIAGNOSIS — O99342 Other mental disorders complicating pregnancy, second trimester: Secondary | ICD-10-CM | POA: Diagnosis present

## 2016-01-25 DIAGNOSIS — F10939 Alcohol use, unspecified with withdrawal, unspecified: Secondary | ICD-10-CM

## 2016-01-25 DIAGNOSIS — Z823 Family history of stroke: Secondary | ICD-10-CM

## 2016-01-25 DIAGNOSIS — Z803 Family history of malignant neoplasm of breast: Secondary | ICD-10-CM

## 2016-01-25 DIAGNOSIS — E876 Hypokalemia: Secondary | ICD-10-CM | POA: Diagnosis present

## 2016-01-25 DIAGNOSIS — Z886 Allergy status to analgesic agent status: Secondary | ICD-10-CM

## 2016-01-25 DIAGNOSIS — R1110 Vomiting, unspecified: Secondary | ICD-10-CM | POA: Diagnosis present

## 2016-01-25 DIAGNOSIS — F10929 Alcohol use, unspecified with intoxication, unspecified: Secondary | ICD-10-CM | POA: Diagnosis present

## 2016-01-25 DIAGNOSIS — F1020 Alcohol dependence, uncomplicated: Secondary | ICD-10-CM | POA: Diagnosis not present

## 2016-01-25 DIAGNOSIS — Z887 Allergy status to serum and vaccine status: Secondary | ICD-10-CM | POA: Diagnosis not present

## 2016-01-25 DIAGNOSIS — O99312 Alcohol use complicating pregnancy, second trimester: Principal | ICD-10-CM | POA: Diagnosis present

## 2016-01-25 DIAGNOSIS — F1010 Alcohol abuse, uncomplicated: Secondary | ICD-10-CM | POA: Diagnosis not present

## 2016-01-25 DIAGNOSIS — Y92481 Parking lot as the place of occurrence of the external cause: Secondary | ICD-10-CM | POA: Diagnosis not present

## 2016-01-25 DIAGNOSIS — Z853 Personal history of malignant neoplasm of breast: Secondary | ICD-10-CM

## 2016-01-25 DIAGNOSIS — R569 Unspecified convulsions: Secondary | ICD-10-CM | POA: Diagnosis present

## 2016-01-25 DIAGNOSIS — R112 Nausea with vomiting, unspecified: Secondary | ICD-10-CM | POA: Diagnosis present

## 2016-01-25 DIAGNOSIS — Z801 Family history of malignant neoplasm of trachea, bronchus and lung: Secondary | ICD-10-CM

## 2016-01-25 DIAGNOSIS — O9989 Other specified diseases and conditions complicating pregnancy, childbirth and the puerperium: Secondary | ICD-10-CM | POA: Diagnosis present

## 2016-01-25 DIAGNOSIS — F19939 Other psychoactive substance use, unspecified with withdrawal, unspecified: Secondary | ICD-10-CM

## 2016-01-25 DIAGNOSIS — Z881 Allergy status to other antibiotic agents status: Secondary | ICD-10-CM

## 2016-01-25 DIAGNOSIS — D696 Thrombocytopenia, unspecified: Secondary | ICD-10-CM | POA: Diagnosis present

## 2016-01-25 DIAGNOSIS — F1994 Other psychoactive substance use, unspecified with psychoactive substance-induced mood disorder: Secondary | ICD-10-CM

## 2016-01-25 DIAGNOSIS — E43 Unspecified severe protein-calorie malnutrition: Secondary | ICD-10-CM | POA: Diagnosis present

## 2016-01-25 DIAGNOSIS — F17210 Nicotine dependence, cigarettes, uncomplicated: Secondary | ICD-10-CM

## 2016-01-25 DIAGNOSIS — O2512 Malnutrition in pregnancy, second trimester: Secondary | ICD-10-CM | POA: Diagnosis present

## 2016-01-25 DIAGNOSIS — F411 Generalized anxiety disorder: Secondary | ICD-10-CM | POA: Diagnosis present

## 2016-01-25 DIAGNOSIS — O99332 Smoking (tobacco) complicating pregnancy, second trimester: Secondary | ICD-10-CM

## 2016-01-25 DIAGNOSIS — K760 Fatty (change of) liver, not elsewhere classified: Secondary | ICD-10-CM | POA: Diagnosis present

## 2016-01-25 DIAGNOSIS — F10230 Alcohol dependence with withdrawal, uncomplicated: Secondary | ICD-10-CM | POA: Diagnosis not present

## 2016-01-25 DIAGNOSIS — F1094 Alcohol use, unspecified with alcohol-induced mood disorder: Secondary | ICD-10-CM | POA: Diagnosis not present

## 2016-01-25 DIAGNOSIS — O26612 Liver and biliary tract disorders in pregnancy, second trimester: Secondary | ICD-10-CM | POA: Diagnosis present

## 2016-01-25 DIAGNOSIS — F19239 Other psychoactive substance dependence with withdrawal, unspecified: Secondary | ICD-10-CM

## 2016-01-25 DIAGNOSIS — F419 Anxiety disorder, unspecified: Secondary | ICD-10-CM | POA: Diagnosis present

## 2016-01-25 DIAGNOSIS — Z720 Tobacco use: Secondary | ICD-10-CM | POA: Diagnosis present

## 2016-01-25 DIAGNOSIS — F10239 Alcohol dependence with withdrawal, unspecified: Secondary | ICD-10-CM | POA: Diagnosis present

## 2016-01-25 DIAGNOSIS — F10120 Alcohol abuse with intoxication, uncomplicated: Secondary | ICD-10-CM | POA: Diagnosis not present

## 2016-01-25 DIAGNOSIS — E538 Deficiency of other specified B group vitamins: Secondary | ICD-10-CM | POA: Diagnosis present

## 2016-01-25 DIAGNOSIS — Z3490 Encounter for supervision of normal pregnancy, unspecified, unspecified trimester: Secondary | ICD-10-CM

## 2016-01-25 DIAGNOSIS — F39 Unspecified mood [affective] disorder: Secondary | ICD-10-CM | POA: Diagnosis present

## 2016-01-25 DIAGNOSIS — O99112 Other diseases of the blood and blood-forming organs and certain disorders involving the immune mechanism complicating pregnancy, second trimester: Secondary | ICD-10-CM | POA: Diagnosis present

## 2016-01-25 DIAGNOSIS — D649 Anemia, unspecified: Secondary | ICD-10-CM | POA: Diagnosis present

## 2016-01-25 DIAGNOSIS — Z3492 Encounter for supervision of normal pregnancy, unspecified, second trimester: Secondary | ICD-10-CM

## 2016-01-25 DIAGNOSIS — R109 Unspecified abdominal pain: Secondary | ICD-10-CM | POA: Diagnosis present

## 2016-01-25 LAB — COMPREHENSIVE METABOLIC PANEL
ALBUMIN: 2.9 g/dL — AB (ref 3.5–5.0)
ALK PHOS: 177 U/L — AB (ref 38–126)
ALT: 47 U/L (ref 14–54)
ALT: 42 U/L (ref 14–54)
AST: 141 U/L — AB (ref 15–41)
AST: 120 U/L — AB (ref 15–41)
Albumin: 2.8 g/dL — ABNORMAL LOW (ref 3.5–5.0)
Alkaline Phosphatase: 172 U/L — ABNORMAL HIGH (ref 38–126)
Anion gap: 9 (ref 5–15)
Anion gap: 8 (ref 5–15)
BILIRUBIN TOTAL: 0.5 mg/dL (ref 0.3–1.2)
BILIRUBIN TOTAL: 0.7 mg/dL (ref 0.3–1.2)
CO2: 28 mmol/L (ref 22–32)
CO2: 24 mmol/L (ref 22–32)
Calcium: 8.2 mg/dL — ABNORMAL LOW (ref 8.9–10.3)
Calcium: 7.5 mg/dL — ABNORMAL LOW (ref 8.9–10.3)
Chloride: 99 mmol/L — ABNORMAL LOW (ref 101–111)
Chloride: 103 mmol/L (ref 101–111)
GLUCOSE: 110 mg/dL — AB (ref 65–99)
Glucose, Bld: 104 mg/dL — ABNORMAL HIGH (ref 65–99)
POTASSIUM: 2.4 mmol/L — AB (ref 3.5–5.1)
Potassium: 3.2 mmol/L — ABNORMAL LOW (ref 3.5–5.1)
Sodium: 136 mmol/L (ref 135–145)
Sodium: 135 mmol/L (ref 135–145)
TOTAL PROTEIN: 6.4 g/dL — AB (ref 6.5–8.1)
TOTAL PROTEIN: 6.2 g/dL — AB (ref 6.5–8.1)

## 2016-01-25 LAB — CBC WITH DIFFERENTIAL/PLATELET
BASOS ABS: 0 10*3/uL (ref 0.0–0.1)
BASOS PCT: 0 %
Basophils Absolute: 0 10*3/uL (ref 0.0–0.1)
Basophils Relative: 0 %
EOS ABS: 0.1 10*3/uL (ref 0.0–0.7)
EOS PCT: 1 %
Eosinophils Absolute: 0 10*3/uL (ref 0.0–0.7)
Eosinophils Relative: 1 %
HCT: 26 % — ABNORMAL LOW (ref 36.0–46.0)
HEMATOCRIT: 27.1 % — AB (ref 36.0–46.0)
HEMOGLOBIN: 9.5 g/dL — AB (ref 12.0–15.0)
Hemoglobin: 9 g/dL — ABNORMAL LOW (ref 12.0–15.0)
LYMPHS ABS: 1.9 10*3/uL (ref 0.7–4.0)
LYMPHS PCT: 23 %
Lymphocytes Relative: 27 %
Lymphs Abs: 1.8 10*3/uL (ref 0.7–4.0)
MCH: 31.9 pg (ref 26.0–34.0)
MCH: 31.3 pg (ref 26.0–34.0)
MCHC: 35.1 g/dL (ref 30.0–36.0)
MCHC: 34.6 g/dL (ref 30.0–36.0)
MCV: 90.9 fL (ref 78.0–100.0)
MCV: 90.3 fL (ref 78.0–100.0)
Monocytes Absolute: 0.6 10*3/uL (ref 0.1–1.0)
Monocytes Absolute: 0.7 10*3/uL (ref 0.1–1.0)
Monocytes Relative: 7 %
Monocytes Relative: 11 %
NEUTROS ABS: 5.4 10*3/uL (ref 1.7–7.7)
NEUTROS PCT: 69 %
Neutro Abs: 4.2 10*3/uL (ref 1.7–7.7)
Neutrophils Relative %: 61 %
PLATELETS: 132 10*3/uL — AB (ref 150–400)
Platelets: 136 10*3/uL — ABNORMAL LOW (ref 150–400)
RBC: 2.98 MIL/uL — AB (ref 3.87–5.11)
RBC: 2.88 MIL/uL — AB (ref 3.87–5.11)
RDW: 17.7 % — AB (ref 11.5–15.5)
RDW: 17.8 % — AB (ref 11.5–15.5)
WBC: 7.9 10*3/uL (ref 4.0–10.5)
WBC: 6.9 10*3/uL (ref 4.0–10.5)

## 2016-01-25 LAB — ETHANOL
Alcohol, Ethyl (B): 226 mg/dL — ABNORMAL HIGH (ref <5)
Alcohol, Ethyl (B): 87 mg/dL — ABNORMAL HIGH (ref <5)

## 2016-01-25 LAB — RAPID URINE DRUG SCREEN, HOSP PERFORMED
AMPHETAMINES: NOT DETECTED
Barbiturates: NOT DETECTED
Benzodiazepines: POSITIVE — AB
Cocaine: NOT DETECTED
OPIATES: NOT DETECTED
Tetrahydrocannabinol: NOT DETECTED

## 2016-01-25 LAB — URINALYSIS, ROUTINE W REFLEX MICROSCOPIC
BILIRUBIN URINE: NEGATIVE
Glucose, UA: NEGATIVE mg/dL
HGB URINE DIPSTICK: NEGATIVE
KETONES UR: NEGATIVE mg/dL
Leukocytes, UA: NEGATIVE
Nitrite: NEGATIVE
PROTEIN: NEGATIVE mg/dL
Specific Gravity, Urine: 1.016 (ref 1.005–1.030)
pH: 7 (ref 5.0–8.0)

## 2016-01-25 LAB — IRON AND TIBC
Iron: 179 ug/dL — ABNORMAL HIGH (ref 28–170)
Saturation Ratios: 91 % — ABNORMAL HIGH (ref 10.4–31.8)
TIBC: 197 ug/dL — AB (ref 250–450)
UIBC: 18 ug/dL

## 2016-01-25 LAB — MAGNESIUM: MAGNESIUM: 1.3 mg/dL — AB (ref 1.7–2.4)

## 2016-01-25 LAB — FERRITIN: FERRITIN: 246 ng/mL (ref 11–307)

## 2016-01-25 LAB — PHOSPHORUS: PHOSPHORUS: 5.1 mg/dL — AB (ref 2.5–4.6)

## 2016-01-25 LAB — FOLATE: Folate: 2.1 ng/mL — ABNORMAL LOW (ref >5.9)

## 2016-01-25 LAB — VITAMIN B12: VITAMIN B 12: 240 pg/mL (ref 180–914)

## 2016-01-25 MED ORDER — DEXTROSE 5 % IN LACTATED RINGERS IV BOLUS
250.0000 mL | Freq: Once | INTRAVENOUS | Status: AC
  Administered 2016-01-25: 250 mL via INTRAVENOUS

## 2016-01-25 MED ORDER — SORBITOL 70 % SOLN
30.0000 mL | Freq: Every day | Status: DC | PRN
  Filled 2016-01-25: qty 30

## 2016-01-25 MED ORDER — THIAMINE HCL 100 MG/ML IJ SOLN
Freq: Once | INTRAVENOUS | Status: AC
  Administered 2016-01-25: 17:00:00 via INTRAVENOUS
  Filled 2016-01-25: qty 1000

## 2016-01-25 MED ORDER — CHLORHEXIDINE GLUCONATE 0.12 % MT SOLN
15.0000 mL | Freq: Two times a day (BID) | OROMUCOSAL | Status: DC
  Administered 2016-01-26 – 2016-02-01 (×5): 15 mL via OROMUCOSAL
  Filled 2016-01-25 (×12): qty 15

## 2016-01-25 MED ORDER — OXYCODONE HCL 5 MG PO TABS: 5.0000 mg | ORAL_TABLET | ORAL | Status: DC | PRN

## 2016-01-25 MED ORDER — SODIUM CHLORIDE 0.9 % IV BOLUS (SEPSIS)
1000.0000 mL | Freq: Once | INTRAVENOUS | Status: AC
  Administered 2016-01-25: 1000 mL via INTRAVENOUS

## 2016-01-25 MED ORDER — ADULT MULTIVITAMIN W/MINERALS CH: 1.0000 | ORAL_TABLET | Freq: Every day | ORAL | Status: DC

## 2016-01-25 MED ORDER — LORAZEPAM 2 MG/ML IJ SOLN
1.0000 mg | Freq: Once | INTRAMUSCULAR | Status: AC
  Administered 2016-01-25: 1 mg via INTRAVENOUS
  Filled 2016-01-25: qty 1

## 2016-01-25 MED ORDER — LORAZEPAM 2 MG/ML IJ SOLN
0.0000 mg | Freq: Two times a day (BID) | INTRAMUSCULAR | Status: AC
  Administered 2016-01-27 (×2): 2 mg via INTRAVENOUS
  Administered 2016-01-28 – 2016-01-29 (×2): 1 mg via INTRAVENOUS
  Filled 2016-01-25 (×5): qty 1

## 2016-01-25 MED ORDER — IPRATROPIUM-ALBUTEROL 0.5-2.5 (3) MG/3ML IN SOLN: 3.0000 mL | RESPIRATORY_TRACT | Status: DC | PRN

## 2016-01-25 MED ORDER — FOLIC ACID 1 MG PO TABS
1.0000 mg | ORAL_TABLET | Freq: Every day | ORAL | Status: DC
  Administered 2016-01-26: 1 mg via ORAL
  Filled 2016-01-25: qty 1

## 2016-01-25 MED ORDER — POTASSIUM CHLORIDE CRYS ER 20 MEQ PO TBCR
20.0000 meq | EXTENDED_RELEASE_TABLET | Freq: Once | ORAL | Status: AC
  Administered 2016-01-25: 20 meq via ORAL
  Filled 2016-01-25: qty 1

## 2016-01-25 MED ORDER — VITAMIN B-1 100 MG PO TABS
100.0000 mg | ORAL_TABLET | Freq: Every day | ORAL | Status: DC
  Administered 2016-01-26 – 2016-02-02 (×8): 100 mg via ORAL
  Filled 2016-01-25 (×9): qty 1

## 2016-01-25 MED ORDER — POTASSIUM CHLORIDE CRYS ER 20 MEQ PO TBCR: 40.0000 meq | EXTENDED_RELEASE_TABLET | Freq: Once | ORAL | Status: DC

## 2016-01-25 MED ORDER — LORAZEPAM 2 MG/ML IJ SOLN
1.0000 mg | Freq: Four times a day (QID) | INTRAMUSCULAR | Status: AC | PRN
  Administered 2016-01-25 – 2016-01-28 (×5): 1 mg via INTRAVENOUS
  Filled 2016-01-25 (×5): qty 1

## 2016-01-25 MED ORDER — ONDANSETRON HCL 4 MG PO TABS
4.0000 mg | ORAL_TABLET | Freq: Four times a day (QID) | ORAL | Status: DC | PRN
  Administered 2016-01-26 – 2016-02-02 (×2): 4 mg via ORAL
  Filled 2016-01-25 (×2): qty 1

## 2016-01-25 MED ORDER — LORAZEPAM 1 MG PO TABS: 0.0000 mg | ORAL_TABLET | Freq: Two times a day (BID) | ORAL | Status: DC

## 2016-01-25 MED ORDER — SODIUM CHLORIDE 0.9 % IV SOLN
INTRAVENOUS | Status: DC
  Administered 2016-01-25 – 2016-02-02 (×13): via INTRAVENOUS

## 2016-01-25 MED ORDER — POTASSIUM CHLORIDE 10 MEQ/100ML IV SOLN
10.0000 meq | INTRAVENOUS | Status: AC
  Administered 2016-01-25 (×2): 10 meq via INTRAVENOUS
  Filled 2016-01-25 (×2): qty 100

## 2016-01-25 MED ORDER — SODIUM CHLORIDE 0.9% FLUSH
3.0000 mL | Freq: Two times a day (BID) | INTRAVENOUS | Status: DC
Start: 2016-01-25 — End: 2016-02-02
  Administered 2016-01-25 – 2016-01-27 (×2): 3 mL via INTRAVENOUS

## 2016-01-25 MED ORDER — SENNOSIDES-DOCUSATE SODIUM 8.6-50 MG PO TABS: 1.0000 | ORAL_TABLET | Freq: Every evening | ORAL | Status: DC | PRN

## 2016-01-25 MED ORDER — SUCRALFATE 1 GM/10ML PO SUSP
1.0000 g | Freq: Three times a day (TID) | ORAL | Status: DC
  Administered 2016-01-26 – 2016-01-28 (×11): 1 g via ORAL
  Filled 2016-01-25 (×12): qty 10

## 2016-01-25 MED ORDER — METOCLOPRAMIDE HCL 10 MG PO TABS: 10.0000 mg | ORAL_TABLET | Freq: Four times a day (QID) | ORAL | Status: DC | PRN

## 2016-01-25 MED ORDER — CETYLPYRIDINIUM CHLORIDE 0.05 % MT LIQD
7.0000 mL | Freq: Two times a day (BID) | OROMUCOSAL | Status: DC
  Administered 2016-01-27: 7 mL via OROMUCOSAL

## 2016-01-25 MED ORDER — PRENATAL MULTIVITAMIN CH
1.0000 | ORAL_TABLET | Freq: Every day | ORAL | Status: DC
  Administered 2016-01-26 – 2016-02-02 (×8): 1 via ORAL
  Filled 2016-01-25 (×9): qty 1

## 2016-01-25 MED ORDER — LORAZEPAM 1 MG PO TABS: 0.0000 mg | ORAL_TABLET | Freq: Four times a day (QID) | ORAL | Status: DC

## 2016-01-25 MED ORDER — MAGNESIUM SULFATE 4 GM/100ML IV SOLN
4.0000 g | Freq: Once | INTRAVENOUS | Status: AC
  Administered 2016-01-25: 4 g via INTRAVENOUS
  Filled 2016-01-25: qty 100

## 2016-01-25 MED ORDER — THIAMINE HCL 100 MG/ML IJ SOLN: 100.0000 mg | Freq: Every day | INTRAMUSCULAR | Status: DC

## 2016-01-25 MED ORDER — LORAZEPAM 2 MG/ML IJ SOLN
0.0000 mg | Freq: Four times a day (QID) | INTRAMUSCULAR | Status: AC
  Administered 2016-01-25 – 2016-01-26 (×3): 2 mg via INTRAVENOUS
  Administered 2016-01-26: 1 mg via INTRAVENOUS
  Administered 2016-01-26 – 2016-01-27 (×4): 2 mg via INTRAVENOUS
  Filled 2016-01-25 (×8): qty 1

## 2016-01-25 MED ORDER — IPRATROPIUM BROMIDE 0.02 % IN SOLN
0.5000 mg | RESPIRATORY_TRACT | Status: DC | PRN
Start: 2016-01-25 — End: 2016-01-25

## 2016-01-25 MED ORDER — LORAZEPAM 1 MG PO TABS: 1.0000 mg | ORAL_TABLET | Freq: Four times a day (QID) | ORAL | Status: AC | PRN

## 2016-01-25 MED ORDER — ONDANSETRON HCL 4 MG/2ML IJ SOLN
4.0000 mg | Freq: Four times a day (QID) | INTRAMUSCULAR | Status: DC | PRN
  Administered 2016-01-26 – 2016-02-01 (×8): 4 mg via INTRAVENOUS
  Filled 2016-01-25 (×8): qty 2

## 2016-01-25 MED ORDER — ALBUTEROL SULFATE (2.5 MG/3ML) 0.083% IN NEBU: 2.5000 mg | INHALATION_SOLUTION | RESPIRATORY_TRACT | Status: DC | PRN

## 2016-01-25 MED ORDER — POTASSIUM CHLORIDE CRYS ER 20 MEQ PO TBCR
40.0000 meq | EXTENDED_RELEASE_TABLET | Freq: Once | ORAL | Status: AC
  Administered 2016-01-25: 40 meq via ORAL
  Filled 2016-01-25: qty 2

## 2016-01-25 MED ORDER — PANTOPRAZOLE SODIUM 40 MG IV SOLR
40.0000 mg | INTRAVENOUS | Status: DC
  Administered 2016-01-25: 40 mg via INTRAVENOUS
  Filled 2016-01-25: qty 40
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
## 2016-01-25 NOTE — ED Notes (Signed)
She thanks me for the medication; and is visiting with her significant other and is in no distress.  She had calmed down a bit and was less shaky when med (ativan) given.  They are aware we are awaiting the performing of u/s.

## 2016-01-25 NOTE — ED Notes (Signed)
Pt was seen at [facility] for abd pain, n, v and diarrhea, on the 25th, tonight she complains of the same and also states she may be detoxing

## 2016-01-25 NOTE — ED Notes (Signed)
Pt stated she is unable to give a urine sample

## 2016-01-25 NOTE — Discharge Instructions (Signed)
Please call your primary care provider and OB/GYN as soon as possible for follow up evaluation. As we discussed, I highly encourage you to stop drinking alcohol, especially while you are pregnant. Return to the ER for new or worsening symptoms.   Alcohol Use During Pregnancy Drinking alcohol while you are pregnant can harm your developing baby and cause long-term problems for your baby after birth. Drinking alcohol during pregnancy can also increase your risk of:  Miscarriage.  Stillbirth.  Premature delivery. When you drink alcohol, it passes through your placenta to your baby. A baby's liver cannot process alcohol like an adult's liver can because it is not mature enough.  WHAT IS FETAL ALCOHOL SYNDROME? Fetal alcohol syndrome is a group of birth defects that can occur in a child whose mother drank excessive amounts of alcohol during pregnancy. Defects can include:  Behavior and attention problems.  Abnormal heart development.  Decreased growth before and after birth.  Learning problems.  Speech problems.  Vision and hearing problems.  Smaller head size than normal.  Sleep and sucking disorders.  Changes in the shape of the face. Women who drink seven or more drinks a week through most of their pregnancy are at highest risk for having a baby with fetal alcohol syndrome. The only way to prevent the condition is by not drinking alcohol. CAN I HAVE ANY ALCOHOL DURING MY PREGNANCY? There is no evidence that any amount of alcohol is safe during pregnancy. Because of this, it is best not to consume alcohol at all. It is best to avoid even the very small amount that may be in some medicine or in an alcohol-free version of an alcoholic beverage.  If you are struggling not to drink, ask your health care provider for help. If you have a drinking problem, the sooner you get help to stop, the better your chance of having a healthy baby. FOR MORE INFORMATION:  [place] on Alcohol  Abuse and Alcoholism: http://www.bradshaw.com/  Substance Abuse and Mental Health Services Administration: www.fascenter.SamedayNews.com.cy   This information is not intended to replace advice given to you by your health care provider. Make sure you discuss any questions you have with your health care provider.   Document Released: 06/10/2007 Document Revised: 09/03/2014 Document Reviewed: 10/15/2013 Elsevier Interactive Patient Education [employer].

## 2016-01-25 NOTE — ED Notes (Signed)
Pt is [redacted] weeks pregnant
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
## 2016-01-25 NOTE — H&P (Signed)
History and Physical    Kristine Macias RUE:[id number] DOB: 04-24-1986 DOA: 01/25/2016  PCP: Leonides Sake, MD  Patient coming from: Home  Chief Complaint: Abdominal pain/nausea/vomiting/tremors/alcohol withdrawal seizures  HPI: Kristine Macias is a 30 y.o. female [redacted] weeks pregnant, with medical history significant of depression and anxiety, alcoholic hepatitis, alcohol withdrawal seizures, thrombocytopenia, history of breast cancer age 73 status post lumpectomy, congenital deafness, ongoing alcohol abuse which is recurrent, who presented to the ED abdominal pain, nausea and emesis which have been ongoing for the past week. Patient endorses subjective fevers, chills, nausea, vomiting, some shortness of breath, loose stools. Patient states last drink was the night prior to admission. Patient endorses drinking 4-5, 24 ounces of beer daily. Patient denies any liquor intake. Patient denies any hematemesis, no melena, no hematochezia. Patient denies any productive cough, no constipation, no dysuria. Patient endorses generalized tremors. Patient stated has had seizures in the past from alcohol withdrawal when she tried to detox himself at home. Patient denies any visual changes, no headaches, no asymmetric weakness or numbness.  ED Course: Patient was initially seen in the ED the night prior to admission noted at that point in time to have alcohol level of 226. Patient also was noted to have a potassium level of 2.4. Patient was hydrated with IV fluids patient improved clinically was given antiemetics and subsequently discharged. As patient was outside waiting for her ride she was noted to have a seizure and subsequently brought back into the emergency room. Patient was noted to have diffuse tremors and given some Ativan with clinical improvement. ED physician stated she spoke with OB/GYN who felt he was okay to treat the patient with a usual Ativan withdrawal protocol as the benefits outweighed the  risks. [facility] were called to admit the patient for further evaluation and management.  Review of Systems: As per HPI otherwise 10 point review of systems negative.   Past Medical History  Diagnosis Date  . Depression with anxiety initally at age 83  . Chlamydia 2007    bacterial vaginosis 09/2011  . Irregular heart beat 2010    wt/diet related after evaluation  . Renal disorder   . Pneumothorax, spontaneous, tension   . Alcohol abuse 11/2014    drinking since age 69. chronic, recurrent. at least 2 detox admits before 2015.   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   . Failure to thrive in adult 12/2014.     Malnutrition: n/v, not eating, weight loss, BMI 14.  s/p 01/11/2015 PEG (Dr Dorna Leitz).   . Alcoholic hepatitis 08/4780    hepatic steatosis on 11/2014 ultrasound.   . Seizure ([place]) 06/2015    due to ETOH/benzo withdrawal.   . Thrombocytopenia ([place]) 04/2007  . Colitis 12/2014    colonoscopy for diarrhea and abnml CT 01/06/15: erythmatous TI (path: active ileitis, ? emerging IBD), rectal erythema (path: mucosal prolapse). Random bx of normal colon (path unremarkable)   . Spontaneous pneumothorax 08/2012    right.  chest tube placed.   . Breast cancer [facility])     age 26, lump removed from left breast  . Congenital deafness     left ear only.  Also noted in mother and sibling (unilateral).     Past Surgical History  Procedure Laterality Date  . Cesarean section  07/2009     x 1.  G3, Para 2012.   . Chest tube insertion    . Esophagogastroduodenoscopy N/A 01/06/2015    ;  [facility], Dr Rayann Heman. For wt loss, N/V: Normal study, duodenal biopsy/pathology: chronic active duodenitis.   . Colonoscopy with propofol N/A 01/06/2015    [facility], Dr Rayann Heman. colonoscopy for diarrhea and abnml CT 01/06/15: erythmatous TI (path: active ileitis, ? emerging IBD), rectal erythema (path: mucosal prolapse). Random bx of normal colon (path unremarkable)   . Peg placement N/A 01/11/2015    [facility], Dr Rayann Heman.  for  N/V/wt loss/severe malnutrition.   . Esophagogastroduodenoscopy N/A 01/11/2015    Rein-normal with PEG placement     reports that she has been smoking Cigarettes.  She has been smoking about 2.00 packs per day. She has never used smokeless tobacco. She reports that she drinks about 21.6 oz of alcohol per week. She reports that she does not use illicit drugs.  Allergies  Allergen Reactions  . Aspirin Shortness Of Breath  . Effexor [Venlafaxine] Anaphylaxis  . Sulfa Antibiotics Anaphylaxis  . Tetracyclines & Related Anaphylaxis  . Trazodone And Nefazodone Anaphylaxis  . Latex Hives and Itching  . Paxil [Paroxetine] Hives and Itching  . Seroquel [Quetiapine Fumarate] Hives and Itching  . Zoloft [Sertraline Hcl] Hives and Itching    Family History  Problem Relation Age of Onset  . Anesthesia problems Neg Hx   . Thyroid disease Mother   . Cancer Mother     breast cancer  . Cancer Father     lung cancer  . Stroke Other   . Crohn's disease Brother   . Cancer Maternal Grandmother     breast cancer   Mother alive age 67 with a history of hypothyroidism, breast cancer. Father deceased age 68 from lung cancer.  Prior to Admission medications   Medication Sig Start Date End Date Taking? Authorizing Provider  clonazePAM (KLONOPIN) 0.25 MG disintegrating tablet Take 0.25 mg by mouth 2 (two) times daily.   Yes Historical Provider, MD  Prenatal Vit-Fe Fumarate-FA (PRENATAL MULTIVITAMIN) TABS tablet Take 1 tablet by mouth daily.   Yes Historical Provider, MD  metoCLOPramide (REGLAN) 10 MG tablet Take 1 tablet (10 mg total) by mouth every 6 (six) hours as needed for nausea or vomiting. Patient not taking: Reported on 01/25/2016 01/25/16   Olivia Canter Sam, PA-C  metroNIDAZOLE (METROGEL) 0.75 % vaginal gel Place 1 Applicatorful vaginally at bedtime. Apply one applicatorful to vagina at bedtime for 5 days Patient not taking: Reported on 01/25/2016 01/20/16   Manya Silvas, CNM  ondansetron (ZOFRAN  ODT) 8 MG disintegrating tablet Take 1 tablet (8 mg total) by mouth every 8 (eight) hours as needed for nausea or vomiting. Patient not taking: Reported on 01/25/2016 01/17/16   Jola Schmidt, MD  pantoprazole (PROTONIX) 40 MG tablet Take 1 tablet (40 mg total) by mouth daily. Patient not taking: Reported on 01/25/2016 01/20/16   Manya Silvas, CNM  promethazine (PHENERGAN) 25 MG tablet Take 1 tablet (25 mg total) by mouth every 6 (six) hours as needed. Patient not taking: Reported on 01/25/2016 01/20/16   Manya Silvas, CNM  sucralfate (CARAFATE) 1 GM/10ML suspension Take 10 mLs (1 g total) by mouth 4 (four) times daily -  with meals and at bedtime. Patient not taking: Reported on 01/25/2016 01/20/16   Manya Silvas, CNM    Physical Exam: Filed Vitals:   01/25/16 0934 01/25/16 1118 01/25/16 1456  BP: 112/92 108/70 124/80  Pulse: 116 98 99  Temp: 98.3 F (36.8 C) 98.7 F (37.1 C)   TempSrc: Oral Oral   Resp: 12 14 14   Weight: 48.988 kg (108 lb)  SpO2: 95% 97% 97%      Constitutional: NAD, Patient noted to have tremors. Filed Vitals:   01/25/16 0934 01/25/16 1118 01/25/16 1456  BP: 112/92 108/70 124/80  Pulse: 116 98 99  Temp: 98.3 F (36.8 C) 98.7 F (37.1 C)   TempSrc: Oral Oral   Resp: 12 14 14   Weight: 48.988 kg (108 lb)    SpO2: 95% 97% 97%   Eyes: PERRLA, EOMI, lids and conjunctivae normal ENMT: Mucous membranes are dry. Posterior pharynx clear of any exudate or lesions.Normal dentition.  Neck: normal, supple, no masses, no thyromegaly Respiratory: clear to auscultation bilaterally, no wheezing, no crackles. Normal respiratory effort. No accessory muscle use.  Cardiovascular: Regular rate and rhythm, no murmurs / rubs / gallops. No extremity edema. 2+ pedal pulses. No carotid bruits.  Abdomen: Soft, tenderness to palpation in the suprapubic region, no masses palpated. No hepatosplenomegaly. Bowel sounds positive.  Musculoskeletal: no clubbing / cyanosis. No joint  deformity upper and lower extremities. Good ROM, no contractures. Normal muscle tone.  Skin: no rashes, lesions, ulcers. No induration Neurologic: CN 2-12 grossly intact. Sensation intact, DTR normal. Strength 5/5 in all 4.  Psychiatric: Normal judgment and insight. Alert and oriented x 3. Normal mood.    Labs on Admission: I have personally reviewed following labs and imaging studies  CBC:  Recent Labs Lab 01/20/16 0012 01/25/16 0430 01/25/16 1030  WBC 15.1* 7.9 6.9  NEUTROABS  --  5.4 4.2  HGB 9.6* 9.5* 9.0*  HCT 28.5* 27.1* 26.0*  MCV 90.2 90.9 90.3  PLT 181 136* 893*   Basic Metabolic Panel:  Recent Labs Lab 01/20/16 0012 01/25/16 0430 01/25/16 1030  NA 137 136 135  K 3.1* 2.4* 3.2*  CL 102 99* 103  CO2 23 28 24   GLUCOSE 90 110* 104*  BUN 8 <5* <5*  CREATININE <0.30* <0.30* <0.30*  CALCIUM 8.2* 8.2* 7.5*   GFR: CrCl cannot be calculated (Patient has no serum creatinine result on file.). Liver Function Tests:  Recent Labs Lab 01/20/16 0012 01/25/16 0430 01/25/16 1030  AST 106* 141* 120*  ALT 52 47 42  ALKPHOS 146* 177* 172*  BILITOT 0.8 0.5 0.7  PROT 6.5 6.4* 6.2*  ALBUMIN 3.1* 2.9* 2.8*   No results for input(s): LIPASE, AMYLASE in the last 168 hours. No results for input(s): AMMONIA in the last 168 hours. Coagulation Profile: No results for input(s): INR, PROTIME in the last 168 hours. Cardiac Enzymes: No results for input(s): CKTOTAL, CKMB, CKMBINDEX, TROPONINI in the last 168 hours. BNP (last 3 results) No results for input(s): PROBNP in the last 8760 hours. HbA1C: No results for input(s): HGBA1C in the last 72 hours. CBG: No results for input(s): GLUCAP in the last 168 hours. Lipid Profile: No results for input(s): CHOL, HDL, LDLCALC, TRIG, CHOLHDL, LDLDIRECT in the last 72 hours. Thyroid Function Tests: No results for input(s): TSH, T4TOTAL, FREET4, T3FREE, THYROIDAB in the last 72 hours. Anemia Panel: No results for input(s):  VITAMINB12, FOLATE, FERRITIN, TIBC, IRON, RETICCTPCT in the last 72 hours. Urine analysis:    Component Value Date/Time   COLORURINE YELLOW 01/25/2016 1009   COLORURINE Yellow 12/20/2014 1018   APPEARANCEUR CLEAR 01/25/2016 1009   APPEARANCEUR Clear 12/20/2014 1018   LABSPEC 1.016 01/25/2016 1009   LABSPEC 1.006 12/20/2014 1018   PHURINE 7.0 01/25/2016 1009   PHURINE 6.0 12/20/2014 1018   GLUCOSEU NEGATIVE 01/25/2016 1009   GLUCOSEU Negative 12/20/2014 [place] 01/25/2016 1009   HGBUR  1+ 12/20/2014 1018   BILIRUBINUR NEGATIVE 01/25/2016 1009   BILIRUBINUR neg 12/29/2015 1501   BILIRUBINUR Negative 12/20/2014 1018   KETONESUR NEGATIVE 01/25/2016 1009   KETONESUR Negative 12/20/2014 1018   PROTEINUR NEGATIVE 01/25/2016 1009   PROTEINUR trace 12/29/2015 1501   PROTEINUR Negative 12/20/2014 1018   UROBILINOGEN negative 12/29/2015 1501   UROBILINOGEN 0.2 06/13/2012 2306   NITRITE NEGATIVE 01/25/2016 1009   NITRITE neg 12/29/2015 1501   NITRITE Negative 12/20/2014 1018   LEUKOCYTESUR NEGATIVE 01/25/2016 1009   LEUKOCYTESUR Negative 12/20/2014 1018   Sepsis Labs: !!!!!!!!!!!!!!!!!!!!!!!!!!!!!!!!!!!!!!!!!!!! @LABRCNTIP (procalcitonin:4,lacticidven:4) ) Recent Results (from the past 240 hour(s))  Wet prep, genital     Status: Abnormal   Collection Time: 01/20/16  1:36 AM  Result Value Ref Range Status   Yeast Wet Prep HPF POC NONE SEEN NONE SEEN Final   Trich, Wet Prep NONE SEEN NONE SEEN Final   Clue Cells Wet Prep HPF POC PRESENT (A) NONE SEEN Final   WBC, Wet Prep HPF POC MANY (A) NONE SEEN Final    Comment: MODERATE BACTERIA SEEN   Sperm NONE SEEN  Final     Radiological Exams on Admission: US Ob Limited  01/25/2016  CLINICAL DATA:  Vomiting.  Alcohol use. EXAM: LIMITED OBSTETRIC ULTRASOUND COMPARISON:  12/30/2015 FINDINGS: Number of Fetuses:  1 Heart Rate:  163 bpm Movement:  Yes Presentation: Transverse Placental Location: Posterior Previa: No Amniotic Fluid  (Subjective):  Within normal limits. BPD:  2.82cm 15w  0d MATERNAL FINDINGS: Cervix:  Appears closed. Uterus/Adnexae:  No abnormality visualized. IMPRESSION: Single living intrauterine gestation as above. This exam is performed on an emergent basis and does not comprehensively evaluate fetal size, dating, or anatomy; follow-up complete OB US should be considered if further fetal assessment is warranted. Electronically Signed   By: Logan Bores M.D.   On: 01/25/2016 13:55    EKG: None  Assessment/Plan Principal Problem:   Alcohol withdrawal (HCC) Active Problems:   Alcohol use disorder, severe, dependence (HCC)   Generalized anxiety disorder   Protein-calorie malnutrition, severe (HCC)   Intractable nausea and vomiting   Hypokalemia   Vomiting and diarrhea   Abdominal pain, generalized   Panic disorder   Withdrawal seizures ([place])   Alcohol intoxication ([place])   Thrombocytopenia ([place])   Alcoholic hepatitis without ascites   Alcohol abuse   Alcohol withdrawal seizure ([place])   Tobacco abuse   Pregnant   Anemia  #1 alcohol withdrawal/alcohol withdrawal seizures Patient noted to be going through alcohol withdrawal in the emergency room and on initial discharge from the ED patient was noted to have a seizure outside in the parking lot. Patient with a significant history of alcohol abuse started drinking alcohol at age 18. Patient states that has been drinking heavily over the past 2 years and has been trying to detox herself however has had some seizures while trying to do this. Will admit patient to telemetry. Check a magnesium level. Place on a banana bag. ED physician stated spoke with Dr. Roselie Awkward of OB/GYN who recommended treatment with the Ativan withdrawal protocol due to risk-benefit ratio. Place on the Ativan withdrawal protocol. Keep magnesium greater than 2. Replace potassium. Place on thiamine, folic acid, multivitamin. Seizure precautions. Social work consult.  #2 hypokalemia Check a  magnesium level. Replete.  #3 nausea/vomiting/abdominal pain Likely secondary to alcohol withdrawal. Abdominal exam is benign. Patient noted to be [redacted] weeks pregnant. Obstetric ultrasound done by ED physician shows a closed cervix, single living intrauterine gestation, amniotic fluid  within normal limits, no previa. Place on Ativan withdrawal protocol. IV fluids. Supportive care.  #4 tobacco abuse Tobacco cessation. Nicotine patch offered to patient however she refuses at this time.  #5 transaminitis Secondary to ongoing alcohol abuse.  #6 alcohol abuse See problem #1. Social work consult.  #7 severe protein calorie malnutrition Consult with dietitian.  #8 thrombocytopenia Patient with no bleeding. Likely secondary to ongoing alcohol abuse. Follow.  #9 anemia No overt bleeding. Check an anemia panel. Follow H&H.  #10  16 weeks pregnancy Obstetric ultrasound done by ED physician without any acute abnormalities. Ultrasound showing a closed cervix, single living intrauterine gestation, amniotic fluid within normal limits, no previa. Outpatient follow-up.   DVT prophylaxis: SCDs. Code Status: Full Family Communication: Updated patient. No family at bedside. Disposition Plan: Home once Ativan withdrawal protocol has been completed and patient medically stable. Consults called: Social work Admission status: Admitted inpatient. Telemetry.   [facility] MD [facility] Pager [phone]  If 7PM-7AM, please contact night-coverage [email or web address] Password [email or web address]  01/25/2016, 3:13 PM

## 2016-01-25 NOTE — ED Provider Notes (Signed)
CSN: [id number]     Arrival date & time 01/25/16  0236 History   First MD Initiated Contact with Patient 01/25/16 [phone]     Chief Complaint  Patient presents with  . Abdominal Pain  . Drug / Alcohol Assessment     (Consider location/radiation/quality/duration/timing/severity/associated sxs/prior Treatment) HPI Comments: This a 30 year old female who is [redacted] weeks pregnant.  She is a chronic alcoholic who is been binge drinking.  She was seen at [facility] on May 25 discharged home after IV fluids, given a prescription for Zofran, which she states she is taking, but has had persistent abdominal pain and vomiting.  Denies any dysuria, constipation  Patient is a 30 y.o. female presenting with abdominal pain and drug/alcohol assessment. The history is provided by the patient.  Abdominal Pain Pain location:  Generalized Pain quality: dull   Pain severity:  Mild Onset quality:  Gradual Timing:  Constant Progression:  Unchanged Chronicity:  Recurrent Context: alcohol use   Relieved by:  Nothing Worsened by:  Eating Associated symptoms: nausea and vomiting   Associated symptoms: no chills, no constipation, no diarrhea, no fever and no shortness of breath   Drug / Alcohol Assessment Associated symptoms: abdominal pain, nausea and vomiting   Associated symptoms: no shortness of breath     Past Medical History  Diagnosis Date  . Depression with anxiety initally at age 89  . Chlamydia 2007    bacterial vaginosis 09/2011  . Irregular heart beat 2010    wt/diet related after evaluation  . Renal disorder   . Pneumothorax, spontaneous, tension   . Alcohol abuse 11/2014    drinking since age 58. chronic, recurrent. at least 2 detox admits before 2015.   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   . Failure to thrive in adult 12/2014.     Malnutrition: n/v, not eating, weight loss, BMI 14.  s/p 01/11/2015 PEG (Dr Dorna Leitz).   . Alcoholic hepatitis 03/3418    hepatic steatosis on 11/2014  ultrasound.   . Seizure ([place]) 06/2015    due to ETOH/benzo withdrawal.   . Thrombocytopenia ([place]) 04/2007  . Colitis 12/2014    colonoscopy for diarrhea and abnml CT 01/06/15: erythmatous TI (path: active ileitis, ? emerging IBD), rectal erythema (path: mucosal prolapse). Random bx of normal colon (path unremarkable)   . Spontaneous pneumothorax 08/2012    right.  chest tube placed.   . Breast cancer [facility])     age 53, lump removed from left breast  . Congenital deafness     left ear only.  Also noted in mother and sibling (unilateral).    Past Surgical History  Procedure Laterality Date  . Cesarean section  07/2009     x 1.  G3, Para 2012.   . Chest tube insertion    . Esophagogastroduodenoscopy N/A 01/06/2015    ;[facility], Dr Rayann Heman. For wt loss, N/V: Normal study, duodenal biopsy/pathology: chronic active duodenitis.   . Colonoscopy with propofol N/A 01/06/2015    [facility], Dr Rayann Heman. colonoscopy for diarrhea and abnml CT 01/06/15: erythmatous TI (path: active ileitis, ? emerging IBD), rectal erythema (path: mucosal prolapse). Random bx of normal colon (path unremarkable)   . Peg placement N/A 01/11/2015    [facility], Dr Rayann Heman.  for N/V/wt loss/severe malnutrition.   . Esophagogastroduodenoscopy N/A 01/11/2015    Rein-normal with PEG placement   Family History  Problem Relation Age of Onset  . Anesthesia problems Neg Hx   . Thyroid  disease Mother   . Cancer Mother     breast cancer  . Cancer Father     lung cancer  . Stroke Other   . Crohn's disease Brother   . Cancer Maternal Grandmother     breast cancer   Social History  Substance Use Topics  . Smoking status: Current Every Day Smoker -- 2.00 packs/day    Types: Cigarettes  . Smokeless tobacco: Never Used  . Alcohol Use: 21.6 oz/week    36 Cans of beer per week     Comment: 6 beers/day; trying to quit, participating in AA   OB History    Gravida Para Term Preterm AB TAB SAB Ectopic Multiple Living   6 2 2  0 3 1 2  0 0 2     Review of  Systems  Constitutional: Negative for fever and chills.  Respiratory: Negative for shortness of breath.   Gastrointestinal: Positive for nausea, vomiting and abdominal pain. Negative for diarrhea and constipation.  Genitourinary: Positive for decreased urine volume.  All other systems reviewed and are negative.     Allergies  Aspirin; Effexor; Sulfa antibiotics; Tetracyclines & related; Trazodone and nefazodone; Latex; Paxil; Seroquel; and Zoloft  Home Medications   Prior to Admission medications   Medication Sig Start Date End Date Taking? Authorizing Provider  clonazePAM (KLONOPIN) 0.25 MG disintegrating tablet Take 0.25 mg by mouth 2 (two) times daily.   Yes Historical Provider, MD  Prenatal Vit-Fe Fumarate-FA (PRENATAL MULTIVITAMIN) TABS tablet Take 1 tablet by mouth daily.   Yes Historical Provider, MD  metroNIDAZOLE (METROGEL) 0.75 % vaginal gel Place 1 Applicatorful vaginally at bedtime. Apply one applicatorful to vagina at bedtime for 5 days Patient not taking: Reported on 01/25/2016 01/20/16   Manya Silvas, CNM  ondansetron (ZOFRAN ODT) 8 MG disintegrating tablet Take 1 tablet (8 mg total) by mouth every 8 (eight) hours as needed for nausea or vomiting. Patient not taking: Reported on 01/25/2016 01/17/16   Jola Schmidt, MD  pantoprazole (PROTONIX) 40 MG tablet Take 1 tablet (40 mg total) by mouth daily. Patient not taking: Reported on 01/25/2016 01/20/16   Manya Silvas, CNM  promethazine (PHENERGAN) 25 MG tablet Take 1 tablet (25 mg total) by mouth every 6 (six) hours as needed. Patient not taking: Reported on 01/25/2016 01/20/16   Manya Silvas, CNM  sucralfate (CARAFATE) 1 GM/10ML suspension Take 10 mLs (1 g total) by mouth 4 (four) times daily -  with meals and at bedtime. Patient not taking: Reported on 01/25/2016 01/20/16   Manya Silvas, CNM   BP 115/80 mmHg  Pulse 103  Temp(Src) 98.1 F (36.7 C) (Oral)  Resp 18  SpO2 100%  LMP 07/28/2015 (Approximate) Physical Exam   Constitutional: She is oriented to person, place, and time. She appears well-developed and well-nourished.  HENT:  Head: Normocephalic.  Eyes: Pupils are equal, round, and reactive to light.  Neck: Normal range of motion.  Cardiovascular: Normal rate and regular rhythm.   Pulmonary/Chest: Effort normal and breath sounds normal.  Neurological: She is alert and oriented to person, place, and time.  Skin: Skin is warm and dry.  Nursing note and vitals reviewed.   ED Course  Procedures (including critical care time) Labs Review Labs Reviewed  CBC WITH DIFFERENTIAL/PLATELET - Abnormal; Notable for the following:    RBC 2.98 (*)    Hemoglobin 9.5 (*)    HCT 27.1 (*)    RDW 17.7 (*)    Platelets 136 (*)    All other  components within normal limits  COMPREHENSIVE METABOLIC PANEL - Abnormal; Notable for the following:    Potassium 2.4 (*)    Chloride 99 (*)    Glucose, Bld 110 (*)    BUN <5 (*)    Creatinine, Ser <0.30 (*)    Calcium 8.2 (*)    Total Protein 6.4 (*)    Albumin 2.9 (*)    AST 141 (*)    Alkaline Phosphatase 177 (*)    All other components within normal limits  ETHANOL - Abnormal; Notable for the following:    Alcohol, Ethyl (B) 226 (*)    All other components within normal limits  URINE RAPID DRUG SCREEN, HOSP PERFORMED    Imaging Review No results found. I have personally reviewed and evaluated these images and lab results as part of my medical decision-making.   EKG Interpretation None     1 L of saline, then switched to D5 LR.  Patient was given Zofran, will be reassessed after fluids MDM   Final diagnoses:  None         Junius Creamer, NP 01/25/16 7680  Veatrice Kells, MD 01/25/16 [phone]

## 2016-01-25 NOTE — ED Provider Notes (Signed)
Care assumed from Junius Creamer NP at end of shift. Kristine Macias is an 30 y.o. female currently [redacted] weeks pregnant with a history of EtOH abuse. She  Presents to the ED today for evaluation of persistent abdominal pain with nausea and vomiting since discharge from [facility] on 5/25. She continues to abuse alcohol and admits to binge drinking last night. In the ED her workup revealed a hypokalemia which was repleted. Her EtOH was 226. She has been given 1L NS bolus and D5LR in addition to potassium supplementation. Plan is to PO challenge and as long as she tolerates it well she may be discharged home. She has again been told the importance of alcohol cessation during pregnancy.   Physical Exam  BP 115/80 mmHg  Pulse 103  Temp(Src) 98.1 F (36.7 C) (Oral)  Resp 18  SpO2 100%  LMP 07/28/2015 (Approximate)  Physical Exam  Constitutional: She is oriented to person, place, and time. No distress.  Resting comfortably in bed Not tremulous Chronically ill appearing. Thin.  HENT:  Head: Atraumatic.  Right Ear: External ear normal.  Left Ear: External ear normal.  Nose: Nose normal.  Eyes: Conjunctivae are normal. No scleral icterus.  Neck: Normal range of motion. Neck supple.  Cardiovascular: Normal rate and regular rhythm.   Pulmonary/Chest: Effort normal. No respiratory distress. She exhibits no tenderness.  Abdominal: Soft. She exhibits no distension. There is no tenderness.  Neurological: She is alert and oriented to person, place, and time.  Skin: Skin is warm and dry. She is not diaphoretic.  Psychiatric: She has a normal mood and affect. Her behavior is normal.  Nursing note and vitals reviewed.   ED Course  Procedures  MDM Pt tolerating PO. She is resting comfortably in bed. I discussed the aforementioned plan with pt. I again reiterated importance of alcohol cessation and encouraged close OBGYN follow up. ER return precautions given.      Anne Ng, Hershal Coria 01/25/16  [id number]  April Palumbo, MD 01/26/16 0005

## 2016-01-25 NOTE — ED Notes (Signed)
Pt can go to floor at 14:54.

## 2016-01-25 NOTE — Progress Notes (Signed)
Pt offered scheduled meds at this time.  Pt requested to hold on meds until she was feeling better. Stacey Drain

## 2016-01-26 ENCOUNTER — Encounter: Payer: Self-pay | Admitting: Obstetrics and Gynecology

## 2016-01-26 DIAGNOSIS — E876 Hypokalemia: Secondary | ICD-10-CM

## 2016-01-26 DIAGNOSIS — F10230 Alcohol dependence with withdrawal, uncomplicated: Secondary | ICD-10-CM

## 2016-01-26 DIAGNOSIS — D513 Other dietary vitamin B12 deficiency anemia: Secondary | ICD-10-CM

## 2016-01-26 LAB — COMPREHENSIVE METABOLIC PANEL
ALBUMIN: 2.6 g/dL — AB (ref 3.5–5.0)
ALT: 37 U/L (ref 14–54)
AST: 127 U/L — AB (ref 15–41)
Alkaline Phosphatase: 152 U/L — ABNORMAL HIGH (ref 38–126)
Anion gap: 6 (ref 5–15)
BILIRUBIN TOTAL: 1.5 mg/dL — AB (ref 0.3–1.2)
CHLORIDE: 104 mmol/L (ref 101–111)
CO2: 24 mmol/L (ref 22–32)
Calcium: 7.3 mg/dL — ABNORMAL LOW (ref 8.9–10.3)
Glucose, Bld: 81 mg/dL (ref 65–99)
POTASSIUM: 3 mmol/L — AB (ref 3.5–5.1)
Sodium: 134 mmol/L — ABNORMAL LOW (ref 135–145)
Total Protein: 5.6 g/dL — ABNORMAL LOW (ref 6.5–8.1)

## 2016-01-26 LAB — URINE CULTURE

## 2016-01-26 LAB — CBC
HEMATOCRIT: 24.7 % — AB (ref 36.0–46.0)
Hemoglobin: 8.4 g/dL — ABNORMAL LOW (ref 12.0–15.0)
MCH: 31.8 pg (ref 26.0–34.0)
MCHC: 34 g/dL (ref 30.0–36.0)
MCV: 93.6 fL (ref 78.0–100.0)
PLATELETS: 125 10*3/uL — AB (ref 150–400)
RBC: 2.64 MIL/uL — ABNORMAL LOW (ref 3.87–5.11)
RDW: 18.7 % — AB (ref 11.5–15.5)
WBC: 6.6 10*3/uL (ref 4.0–10.5)

## 2016-01-26 LAB — GLUCOSE, CAPILLARY: Glucose-Capillary: 90 mg/dL (ref 65–99)

## 2016-01-26 LAB — MAGNESIUM: Magnesium: 2 mg/dL (ref 1.7–2.4)

## 2016-01-26 MED ORDER — PANTOPRAZOLE SODIUM 40 MG PO TBEC
40.0000 mg | DELAYED_RELEASE_TABLET | Freq: Every day | ORAL | Status: DC
  Administered 2016-01-26 – 2016-01-30 (×5): 40 mg via ORAL
  Filled 2016-01-26 (×5): qty 1

## 2016-01-26 MED ORDER — POTASSIUM CHLORIDE CRYS ER 20 MEQ PO TBCR
40.0000 meq | EXTENDED_RELEASE_TABLET | ORAL | Status: AC
  Administered 2016-01-26 (×2): 40 meq via ORAL
  Filled 2016-01-26 (×2): qty 2

## 2016-01-26 MED ORDER — FOLIC ACID 5 MG/ML IJ SOLN
2.0000 mg | Freq: Every day | INTRAMUSCULAR | Status: DC
  Administered 2016-01-26 – 2016-02-01 (×6): 2 mg via INTRAVENOUS
  Filled 2016-01-26 (×11): qty 0.4

## 2016-01-26 MED ORDER — CYANOCOBALAMIN 1000 MCG/ML IJ SOLN
1000.0000 ug | Freq: Every day | INTRAMUSCULAR | Status: DC
  Administered 2016-01-26 – 2016-01-30 (×5): 1000 ug via SUBCUTANEOUS
  Filled 2016-01-26 (×5): qty 1

## 2016-01-26 NOTE — Progress Notes (Signed)
PT Cancellation Note  Patient Details Name: Kristine Macias MRN: [id number] DOB: 1986/01/04   Cancelled Treatment:    Reason Eval/Treat Not Completed: PT screened, no needs identified, will sign off OT reports no PT needs at this time.   Reford Olliff,[name] 01/26/2016, 12:14 PM [name], PT, DPT 01/26/2016 Pager: [phone]

## 2016-01-26 NOTE — Progress Notes (Signed)
Occupational Therapy Evaluation Patient Details Name: Kristine Macias MRN: [id number] DOB: [birth year] Today's Date: 01/26/2016    History of Present Illness Kristine Macias is a 30 y.o. female [redacted] weeks pregnant, with medical history significant of depression and anxiety, alcoholic hepatitis, alcohol withdrawal seizures, thrombocytopenia, history of breast cancer age 53 status post lumpectomy, congenital deafness, ongoing alcohol abuse which is recurrent, who presented to the ED abdominal pain, nausea and emesis which have been ongoing for the past week.    Clinical Impression   Patient was able to get up without difficulty and ambulate within room, don socks, operate handheld equipment while in bed. She has mild UE tremors she states happen during withdrawal. She did report nausea/vomiting of "vitamins" she took earlier. I called RN to let her know that and of the patient's request for nausea medication.    Follow Up Recommendations  No OT follow up    Equipment Recommendations  None recommended by OT    Recommendations for Other Services       Precautions / Restrictions Restrictions Weight Bearing Restrictions: No      Mobility Bed Mobility Overal bed mobility: Independent                Transfers Overall transfer level: Independent Equipment used: None                  Balance                                            ADL Overall ADL's : Modified independent                                       General ADL Comments: Patient was able to get to EOB, don socks, ambulate in room including to/from bathroom, operate handheld devices, take sip of drink without difficulty. No further OT needs at this time. Notified physical therapist that patient able to ambulate in her room without difficulty.      Vision     Perception     Praxis      Pertinent Vitals/Pain Pain Assessment: No/denies pain     Hand  Dominance     Extremity/Trunk Assessment Upper Extremity Assessment Upper Extremity Assessment: Overall WFL for tasks assessed (noticed mild BUE tremors)   Lower Extremity Assessment Lower Extremity Assessment: Overall WFL for tasks assessed   Cervical / Trunk Assessment Cervical / Trunk Assessment: Normal   Communication Communication Communication: HOH (congenital deafness L ear per chart)   Cognition Arousal/Alertness: Awake/alert Behavior During Therapy: WFL for tasks assessed/performed Overall Cognitive Status: Within Functional Limits for tasks assessed                     General Comments       Exercises       Shoulder Instructions      Home Living Family/patient expects to be discharged to:: Private residence Living Arrangements: Parent;Children Available Help at Discharge: Family Type of Home: House Home Access: Level entry     Home Layout: One level               Home Equipment: None          Prior Functioning/Environment Level of Independence: Independent  OT Diagnosis: Other (comment) (ETOH abuse)   OT Problem List: Other (comment) (mild BUE tremors)   OT Treatment/Interventions:      OT Goals(Current goals can be found in the care plan section) Acute Rehab OT Goals Patient Stated Goal: none stated OT Goal Formulation: All assessment and education complete, DC therapy  OT Frequency:     Barriers to D/C:            Co-evaluation              End of Session    Activity Tolerance: Patient tolerated treatment well Patient left: in bed;with call bell/phone within reach   Time: 1107-1117 OT Time Calculation (min): 10 min Charges:  OT General Charges $OT Visit: 1 Procedure OT Evaluation $OT Eval Low Complexity: 1 Procedure G-Codes:    Hakan Nudelman A 2016/02/18, 12:34 PM

## 2016-01-26 NOTE — Care Management Note (Signed)
Case Management Note  Patient Details  Name: Kristine Macias MRN: [id number] Date of Birth: Nov 20, 1985  Subjective/Objective: 30 y/o f admitted w/etoh. Hx: pregnancy-16 weeks.From home. Psych CSW-(see note)provided w/alcohol facilities-will follow for detox rehab @ d/c.                   Action/Plan:d/c plan detox rehab.   Expected Discharge Date:   (UNKNOWN)               Expected Discharge Plan:  IP Rehab Facility  In-House Referral:  Clinical Social Work  Discharge planning Services  CM Consult  Post Acute Care Choice:    Choice offered to:     DME Arranged:    DME Agency:     HH Arranged:    [place] Agency:     Status of Service:  In process, will continue to follow  Medicare Important Message Given:    Date Medicare IM Given:    Medicare IM give by:    Date Additional Medicare IM Given:    Additional Medicare Important Message give by:     If discussed at [place] of Stay Meetings, dates discussed:    Additional Comments:  Dessa Phi, RN 01/26/2016, 3:35 PM

## 2016-01-26 NOTE — Progress Notes (Signed)
Initial Nutrition Assessment  DOCUMENTATION CODES:   Severe malnutrition in context of chronic illness, Underweight  INTERVENTION:   Provide daily snacks in between meals. Encourage small frequent meals RD to continue to monitor  NUTRITION DIAGNOSIS:   Increased nutrient needs related to other (see comment) (pregnancy) as evidenced by estimated needs.  GOAL:   Patient will meet greater than or equal to 90% of their needs  MONITOR:   PO intake, Labs, Weight trends, I & O's  REASON FOR ASSESSMENT:   Other (Comment) (Low BMI)    ASSESSMENT:   30 y.o. female [redacted] weeks pregnant, with medical history significant of depression and anxiety, alcoholic hepatitis, alcohol withdrawal seizures, thrombocytopenia, history of breast cancer age 76 status post lumpectomy, congenital deafness, ongoing alcohol abuse which is recurrent, who presented to the ED abdominal pain, nausea and emesis which have been ongoing for the past week. Patient endorses subjective fevers, chills, nausea, vomiting, some shortness of breath, loose stools. Patient states last drink was the night prior to admission. Patient endorses drinking 4-5, 24 ounces of beer daily.   Patient in room with no family at bedside. Pt reports not eating well for 1 week PTA d/t N/V. Pt did eat some cream of chicken soup this morning and ate all of it. Diet has since been advanced to regular diet. She states she is going to order a salad for lunch and a reuben sandwich for dinner. Encouraged pt to increase her protein intake d/t being [redacted] weeks pregnant, explained that her protein and calorie needs have increased since she is in the 2nd trimester. Pt with ETOH abuse, this has probably affected intake and appetite as well. Pt with history of severe malnutrition over the past year, she has a history of a PEG for supplemental nutrition. Weight has increased since February (+14 lb). Pt's UBW ranges from 86-95 lb.  Patient refuses protein  supplements. She is agreeable to receiving daily snacks. RD to order. Nutrition-Focused physical exam completed. Findings are severe fat depletion, moderate-severe muscle depletion, and no edema.   Medications: Folic acid tablet daily, K-DUR every 4 hours, Prenatal multivitamin tablet daily, Thiamine tablet daily Labs reviewed: Low Na, K, Mg Elevated Phosphorus  Diet Order:  Diet regular Room service appropriate?: Yes; Fluid consistency:: Thin  Skin:  Reviewed, no issues  Last BM:  5/31  Height:   Ht Readings from Last 1 Encounters:  01/25/16 5' 6"  (1.676 m)    Weight:   Wt Readings from Last 1 Encounters:  01/26/16 109 lb 3.2 oz (49.533 kg)    Ideal Body Weight:  59.1 kg  BMI:  Body mass index is 17.63 kg/(m^2).  Estimated Nutritional Needs:   Kcal:  1600-1800  Protein:  65-75g  Fluid:  1.8L/day  EDUCATION NEEDS:   Education needs addressed  Clayton Bibles, MS, RD, LDN Pager: [phone] After Hours Pager: [phone]
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
## 2016-01-26 NOTE — Progress Notes (Signed)
PROGRESS NOTE    Kristine Macias  YQM:[id number] DOB: Jun 11, 1986 DOA: 01/25/2016  PCP: Leonides Sake, MD   Brief Narrative:  Kristine Macias is a 30 y.o. female [redacted] weeks pregnant, with medical history significant of depression and anxiety, alcoholic hepatitis, alcohol withdrawal seizures, thrombocytopenia, history of breast cancer age 65 status post lumpectomy, congenital deafness, ongoing alcohol abuse which is recurrent, who presented to the ED abdominal pain, nausea and emesis which have been ongoing for the past week. Patient endorses subjective fevers, chills, nausea, vomiting, some shortness of breath, loose stools. Patient states last drink was the night prior to admission. Patient endorses drinking 4-5, 24 ounces of beer daily. She states she was trying to wean herself of her Clonazepam as well.   Subjective: Nausea. Anxiety has recurred while trying to wean Clonazepam.  No other complaints. Have recommended inpatient rehab for her as this is her 3rd admit for alcohol withdrawal since becoming pregnant.   Assessment & Plan:   Principal Problem:   Alcohol withdrawal with seizure/ Alcohol use disorder, severe, dependence - CIWA scale  - need residential rehab - I will not discharge her until this can be arranged  Active Problems:      Generalized anxiety disorder - wants to stop the Klonipin but now quite anxious- cont ativan- psych consult requested- may need to start SSRI  Acute alcoholic hepatitis - follow LFTs    Protein-calorie malnutrition, severe   - add ensure    Intractable nausea and vomiting - Zofram    Hypokalemia - replace      Pregnant - most recent ultrasound a couple of wks ago shows normal size of fetus-partial ultrasound on 5/31 reveals viable fetus, closed cervix    Anemia - multivitamins - low normal G89 and low folic acid- give V69 daily subcu- start IV folic acid  DVT prophylaxis: scds  Code Status: full code Family Communication:    Disposition Plan: hopefully residential rehab Consultants:   psych Procedures:    Antimicrobials:  Anti-infectives    None       Objective: Filed Vitals:   01/25/16 1854 01/25/16 2045 01/26/16 0610 01/26/16 1041  BP:  108/71 95/50 114/78  Pulse: 105 97 91 96  Temp:  98.7 F (37.1 C) 98.9 F (37.2 C) 98.9 F (37.2 C)  TempSrc:  Oral Oral Oral  Resp:  20 18 18   Height:      Weight:   49.533 kg (109 lb 3.2 oz)   SpO2:  97% 99% 100%    Intake/Output Summary (Last 24 hours) at 01/26/16 1308 Last data filed at 01/26/16 1202  Gross per 24 hour  Intake 1364.17 ml  Output    700 ml  Net 664.17 ml   Filed Weights   01/25/16 0934 01/25/16 1530 01/26/16 0610  Weight: 48.988 kg (108 lb) 49.9 kg (110 lb 0.2 oz) 49.533 kg (109 lb 3.2 oz)    Examination: General exam: Appears comfortable  HEENT: PERRLA, oral mucosa moist, no sclera icterus or thrush Respiratory system: Clear to auscultation. Respiratory effort normal. Cardiovascular system: S1 & S2 heard, RRR.  No murmurs  Gastrointestinal system: Abdomen soft, non-tender, nondistended. Normal bowel sound. No organomegaly Central nervous system: Alert and oriented. No focal neurological deficits. Extremities: No cyanosis, clubbing or edema Skin: No rashes or ulcers Psychiatry:  Mood & affect appropriate.     Data Reviewed: I have personally reviewed following labs and imaging studies  CBC:  Recent Labs Lab 01/20/16 0012 01/25/16 0430 01/25/16  1030 01/26/16 0507  WBC 15.1* 7.9 6.9 6.6  NEUTROABS  --  5.4 4.2  --   HGB 9.6* 9.5* 9.0* 8.4*  HCT 28.5* 27.1* 26.0* 24.7*  MCV 90.2 90.9 90.3 93.6  PLT 181 136* 132* 121*   Basic Metabolic Panel:  Recent Labs Lab 01/20/16 0012 01/25/16 0430 01/25/16 1030 01/25/16 1618 01/26/16 0507  NA 137 136 135  --  134*  K 3.1* 2.4* 3.2*  --  3.0*  CL 102 99* 103  --  104  CO2 23 28 24   --  24  GLUCOSE 90 110* 104*  --  81  BUN 8 <5* <5*  --  <5*  CREATININE <0.30*  <0.30* <0.30*  --  <0.30*  CALCIUM 8.2* 8.2* 7.5*  --  7.3*  MG  --   --  1.3*  --  2.0  PHOS  --   --   --  5.1*  --    GFR: CrCl cannot be calculated (Patient has no serum creatinine result on file.). Liver Function Tests:  Recent Labs Lab 01/20/16 0012 01/25/16 0430 01/25/16 1030 01/26/16 0507  AST 106* 141* 120* 127*  ALT 52 47 42 37  ALKPHOS 146* 177* 172* 152*  BILITOT 0.8 0.5 0.7 1.5*  PROT 6.5 6.4* 6.2* 5.6*  ALBUMIN 3.1* 2.9* 2.8* 2.6*   No results for input(s): LIPASE, AMYLASE in the last 168 hours. No results for input(s): AMMONIA in the last 168 hours. Coagulation Profile: No results for input(s): INR, PROTIME in the last 168 hours. Cardiac Enzymes: No results for input(s): CKTOTAL, CKMB, CKMBINDEX, TROPONINI in the last 168 hours. BNP (last 3 results) No results for input(s): PROBNP in the last 8760 hours. HbA1C: No results for input(s): HGBA1C in the last 72 hours. CBG:  Recent Labs Lab 01/26/16 0800  GLUCAP 90   Lipid Profile: No results for input(s): CHOL, HDL, LDLCALC, TRIG, CHOLHDL, LDLDIRECT in the last 72 hours. Thyroid Function Tests: No results for input(s): TSH, T4TOTAL, FREET4, T3FREE, THYROIDAB in the last 72 hours. Anemia Panel:  Recent Labs  01/25/16 1618  VITAMINB12 240  FOLATE 2.1*  FERRITIN 246  TIBC 197*  IRON 179*   Urine analysis:    Component Value Date/Time   COLORURINE YELLOW 01/25/2016 1009   COLORURINE Yellow 12/20/2014 1018   APPEARANCEUR CLEAR 01/25/2016 1009   APPEARANCEUR Clear 12/20/2014 1018   LABSPEC 1.016 01/25/2016 1009   LABSPEC 1.006 12/20/2014 1018   PHURINE 7.0 01/25/2016 1009   PHURINE 6.0 12/20/2014 1018   GLUCOSEU NEGATIVE 01/25/2016 1009   GLUCOSEU Negative 12/20/2014 1018   HGBUR NEGATIVE 01/25/2016 1009   HGBUR 1+ 12/20/2014 1018   BILIRUBINUR NEGATIVE 01/25/2016 1009   BILIRUBINUR neg 12/29/2015 1501   BILIRUBINUR Negative 12/20/2014 1018   KETONESUR NEGATIVE 01/25/2016 1009   KETONESUR  Negative 12/20/2014 1018   PROTEINUR NEGATIVE 01/25/2016 1009   PROTEINUR trace 12/29/2015 1501   PROTEINUR Negative 12/20/2014 1018   UROBILINOGEN negative 12/29/2015 1501   UROBILINOGEN 0.2 06/13/2012 2306   NITRITE NEGATIVE 01/25/2016 1009   NITRITE neg 12/29/2015 1501   NITRITE Negative 12/20/2014 1018   LEUKOCYTESUR NEGATIVE 01/25/2016 1009   LEUKOCYTESUR Negative 12/20/2014 1018   Sepsis Labs: @LABRCNTIP (procalcitonin:4,lacticidven:4) ) Recent Results (from the past 240 hour(s))  Wet prep, genital     Status: Abnormal   Collection Time: 01/20/16  1:36 AM  Result Value Ref Range Status   Yeast Wet Prep HPF POC NONE SEEN NONE SEEN Final   Trich, Wet  Prep NONE SEEN NONE SEEN Final   Clue Cells Wet Prep HPF POC PRESENT (A) NONE SEEN Final   WBC, Wet Prep HPF POC MANY (A) NONE SEEN Final    Comment: MODERATE BACTERIA SEEN   Sperm NONE SEEN  Final         Radiology Studies: US Ob Limited  01/25/2016  CLINICAL DATA:  Vomiting.  Alcohol use. EXAM: LIMITED OBSTETRIC ULTRASOUND COMPARISON:  12/30/2015 FINDINGS: Number of Fetuses:  1 Heart Rate:  163 bpm Movement:  Yes Presentation: Transverse Placental Location: Posterior Previa: No Amniotic Fluid (Subjective):  Within normal limits. BPD:  2.82cm 15w  0d MATERNAL FINDINGS: Cervix:  Appears closed. Uterus/Adnexae:  No abnormality visualized. IMPRESSION: Single living intrauterine gestation as above. This exam is performed on an emergent basis and does not comprehensively evaluate fetal size, dating, or anatomy; follow-up complete OB US should be considered if further fetal assessment is warranted. Electronically Signed   By: Logan Bores M.D.   On: 01/25/2016 13:55      Scheduled Meds: . antiseptic oral rinse  7 mL Mouth Rinse q12n4p  . chlorhexidine  15 mL Mouth Rinse BID  . folic acid  1 mg Oral Daily  . LORazepam  0-4 mg Intravenous Q6H   Followed by  . [START ON 01/27/2016] LORazepam  0-4 mg Intravenous Q12H  . pantoprazole   40 mg Oral Daily  . potassium chloride  40 mEq Oral Q4H  . prenatal multivitamin  1 tablet Oral Daily  . sodium chloride flush  3 mL Intravenous Q12H  . sucralfate  1 g Oral TID WC & HS  . thiamine  100 mg Oral Daily   Or  . thiamine  100 mg Intravenous Daily   Continuous Infusions: . sodium chloride 100 mL/hr at 01/26/16 1201     LOS: 1 day    Time spent in minutes: 42    [place], MD [facility] Pager: [email or web address] Password [email or web address] 01/26/2016, 1:08 PM

## 2016-01-27 DIAGNOSIS — F0630 Mood disorder due to known physiological condition, unspecified: Secondary | ICD-10-CM

## 2016-01-27 DIAGNOSIS — E43 Unspecified severe protein-calorie malnutrition: Secondary | ICD-10-CM

## 2016-01-27 DIAGNOSIS — Z720 Tobacco use: Secondary | ICD-10-CM

## 2016-01-27 DIAGNOSIS — F1994 Other psychoactive substance use, unspecified with psychoactive substance-induced mood disorder: Secondary | ICD-10-CM

## 2016-01-27 LAB — COMPREHENSIVE METABOLIC PANEL
ALBUMIN: 2.8 g/dL — AB (ref 3.5–5.0)
ALT: 33 U/L (ref 14–54)
AST: 84 U/L — AB (ref 15–41)
Alkaline Phosphatase: 156 U/L — ABNORMAL HIGH (ref 38–126)
Anion gap: 7 (ref 5–15)
BILIRUBIN TOTAL: 0.9 mg/dL (ref 0.3–1.2)
BUN: <5 mg/dL — ABNORMAL LOW (ref 6–20)
CALCIUM: 8.5 mg/dL — AB (ref 8.9–10.3)
CO2: 22 mmol/L (ref 22–32)
Chloride: 105 mmol/L (ref 101–111)
Creatinine, Ser: <0.3 mg/dL — ABNORMAL LOW (ref 0.44–1.00)
Glucose, Bld: 85 mg/dL (ref 65–99)
POTASSIUM: 3.9 mmol/L (ref 3.5–5.1)
Sodium: 134 mmol/L — ABNORMAL LOW (ref 135–145)
TOTAL PROTEIN: 6.5 g/dL (ref 6.5–8.1)

## 2016-01-27 LAB — CBC
HEMATOCRIT: 25.8 % — AB (ref 36.0–46.0)
Hemoglobin: 8.7 g/dL — ABNORMAL LOW (ref 12.0–15.0)
MCH: 32.2 pg (ref 26.0–34.0)
MCHC: 33.7 g/dL (ref 30.0–36.0)
MCV: 95.6 fL (ref 78.0–100.0)
PLATELETS: 140 10*3/uL — AB (ref 150–400)
RBC: 2.7 MIL/uL — ABNORMAL LOW (ref 3.87–5.11)
RDW: 19.3 % — AB (ref 11.5–15.5)
WBC: 10.3 10*3/uL (ref 4.0–10.5)

## 2016-01-27 LAB — GLUCOSE, CAPILLARY: GLUCOSE-CAPILLARY: 82 mg/dL (ref 65–99)

## 2016-01-27 NOTE — Progress Notes (Addendum)
LCSW following to help with residential treatment substance abuse treatment  options after discharge. Patient has been given 2 options in local area for inpatient residential. Patient is requesting more information specifically for Day[facility] program [facility].  LCSW is working to obtain more information. Offered to make referral for patient to begin placement process as it does take a matter of WEEKS to get into this type of program.  Patient declined at this time, which she has the right to do.  Voicemail left for CPS worker: [facility] [phone]). She is still deciding whether or not she will consent to treatment. Will have weekend SW follow up with plan. If patient agreeable, will make referral over weekend, packet is in her room.    Lane Hacker, MSW Clinical Social Work: System [employer] [phone]

## 2016-01-27 NOTE — Progress Notes (Signed)
PROGRESS NOTE    Kristine Macias  DDU:[id number] DOB: [birth year] DOA: 01/25/2016  PCP: Leonides Sake, MD   Brief Narrative:  Kristine Macias is a 30 y.o. female [redacted] weeks pregnant, with medical history significant of depression and anxiety, alcoholic hepatitis, alcohol withdrawal seizures, thrombocytopenia, history of breast cancer age 78 status post lumpectomy, congenital deafness, ongoing alcohol abuse which is recurrent, who presented to the ED abdominal pain, nausea and emesis which have been ongoing for the past week. Patient endorses subjective fevers, chills, nausea, vomiting, some shortness of breath, loose stools. Patient states last drink was the night prior to admission. Patient endorses drinking 4-5, 24 ounces of beer daily. She states she was trying to wean herself of her Clonazepam as well.   Subjective: Nausea. No vomiting. Continues to be anxious. Thinking about going to inpatient rehab. Resource were provided but she has not called yet. Discussed fetal alcohol syndrome with her in detail.   Assessment & Plan:   Principal Problem:   Alcohol withdrawal with seizure/ Alcohol use disorder, severe, dependence - CIWA scale  - needs residential rehab - I will not discharge her until this can be arranged  Active Problems:      Generalized anxiety disorder - wants to stop the Klonipin but now quite anxious- cont ativan- psych consult requested- may need to start SSRI  Acute alcoholic hepatitis - follow LFTs    Protein-calorie malnutrition, severe   - add ensure    Intractable nausea and vomiting - Zofran    Hypokalemia - replace      Pregnant - most recent ultrasound a couple of wks ago shows normal size of fetus-partial ultrasound on 5/31 reveals viable fetus, closed cervix    Anemia - multivitamins - low normal C37 and low folic acid- give S28 daily subcu- start IV folic acid  DVT prophylaxis: scds  Code Status: full code Family Communication:    Disposition Plan: hopefully residential rehab Consultants:   psych Procedures:    Antimicrobials:  Anti-infectives    None       Objective: Filed Vitals:   01/26/16 1952 01/26/16 2112 01/27/16 0312 01/27/16 0448  BP:  100/58 114/71   Pulse:  104 97   Temp:  98.4 F (36.9 C) 98.5 F (36.9 C)   TempSrc:  Oral Oral   Resp: 18 18 20    Height:      Weight:    49.714 kg (109 lb 9.6 oz)  SpO2: 97% 100% 100%     Intake/Output Summary (Last 24 hours) at 01/27/16 1039 Last data filed at 01/27/16 1023  Gross per 24 hour  Intake 2813.35 ml  Output    701 ml  Net 2112.35 ml   Filed Weights   01/25/16 1530 01/26/16 0610 01/27/16 0448  Weight: 49.9 kg (110 lb 0.2 oz) 49.533 kg (109 lb 3.2 oz) 49.714 kg (109 lb 9.6 oz)    Examination: General exam: Appears anxious HEENT: PERRLA, oral mucosa moist, no sclera icterus or thrush Respiratory system: Clear to auscultation. Respiratory effort normal. Cardiovascular system: S1 & S2 heard, RRR.  No murmurs  Gastrointestinal system: Abdomen soft, non-tender, nondistended. Normal bowel sound. No organomegaly Central nervous system: Alert and oriented. No focal neurological deficits. Extremities: No cyanosis, clubbing or edema Skin: No rashes or ulcers Psychiatry:  Mood & affect appropriate.     Data Reviewed: I have personally reviewed following labs and imaging studies  CBC:  Recent Labs Lab 01/25/16 0430 01/25/16 1030 01/26/16 0507 01/27/16 3151  WBC 7.9 6.9 6.6 10.3  NEUTROABS 5.4 4.2  --   --   HGB 9.5* 9.0* 8.4* 8.7*  HCT 27.1* 26.0* 24.7* 25.8*  MCV 90.9 90.3 93.6 95.6  PLT 136* 132* 125* 119*   Basic Metabolic Panel:  Recent Labs Lab 01/25/16 0430 01/25/16 1030 01/25/16 1618 01/26/16 0507 01/27/16 0437  NA 136 135  --  134* 134*  K 2.4* 3.2*  --  3.0* 3.9  CL 99* 103  --  104 105  CO2 28 24  --  24 22  GLUCOSE 110* 104*  --  81 85  BUN <5* <5*  --  <5* <5*  CREATININE <0.30* <0.30*  --  <0.30*  <0.30*  CALCIUM 8.2* 7.5*  --  7.3* 8.5*  MG  --  1.3*  --  2.0  --   PHOS  --   --  5.1*  --   --    GFR: CrCl cannot be calculated (Patient has no serum creatinine result on file.). Liver Function Tests:  Recent Labs Lab 01/25/16 0430 01/25/16 1030 01/26/16 0507 01/27/16 0437  AST 141* 120* 127* 84*  ALT 47 42 37 33  ALKPHOS 177* 172* 152* 156*  BILITOT 0.5 0.7 1.5* 0.9  PROT 6.4* 6.2* 5.6* 6.5  ALBUMIN 2.9* 2.8* 2.6* 2.8*   No results for input(s): LIPASE, AMYLASE in the last 168 hours. No results for input(s): AMMONIA in the last 168 hours. Coagulation Profile: No results for input(s): INR, PROTIME in the last 168 hours. Cardiac Enzymes: No results for input(s): CKTOTAL, CKMB, CKMBINDEX, TROPONINI in the last 168 hours. BNP (last 3 results) No results for input(s): PROBNP in the last 8760 hours. HbA1C: No results for input(s): HGBA1C in the last 72 hours. CBG:  Recent Labs Lab 01/26/16 0800  GLUCAP 90   Lipid Profile: No results for input(s): CHOL, HDL, LDLCALC, TRIG, CHOLHDL, LDLDIRECT in the last 72 hours. Thyroid Function Tests: No results for input(s): TSH, T4TOTAL, FREET4, T3FREE, THYROIDAB in the last 72 hours. Anemia Panel:  Recent Labs  01/25/16 1618  VITAMINB12 240  FOLATE 2.1*  FERRITIN 246  TIBC 197*  IRON 179*   Urine analysis:    Component Value Date/Time   COLORURINE YELLOW 01/25/2016 1009   COLORURINE Yellow 12/20/2014 1018   APPEARANCEUR CLEAR 01/25/2016 1009   APPEARANCEUR Clear 12/20/2014 1018   LABSPEC 1.016 01/25/2016 1009   LABSPEC 1.006 12/20/2014 1018   PHURINE 7.0 01/25/2016 1009   PHURINE 6.0 12/20/2014 1018   GLUCOSEU NEGATIVE 01/25/2016 1009   GLUCOSEU Negative 12/20/2014 1018   HGBUR NEGATIVE 01/25/2016 1009   HGBUR 1+ 12/20/2014 1018   BILIRUBINUR NEGATIVE 01/25/2016 1009   BILIRUBINUR neg 12/29/2015 1501   BILIRUBINUR Negative 12/20/2014 1018   KETONESUR NEGATIVE 01/25/2016 1009   KETONESUR Negative 12/20/2014  1018   PROTEINUR NEGATIVE 01/25/2016 1009   PROTEINUR trace 12/29/2015 1501   PROTEINUR Negative 12/20/2014 1018   UROBILINOGEN negative 12/29/2015 1501   UROBILINOGEN 0.2 06/13/2012 2306   NITRITE NEGATIVE 01/25/2016 1009   NITRITE neg 12/29/2015 1501   NITRITE Negative 12/20/2014 1018   LEUKOCYTESUR NEGATIVE 01/25/2016 1009   LEUKOCYTESUR Negative 12/20/2014 1018   Sepsis Labs: @LABRCNTIP (procalcitonin:4,lacticidven:4) ) Recent Results (from the past 240 hour(s))  Wet prep, genital     Status: Abnormal   Collection Time: 01/20/16  1:36 AM  Result Value Ref Range Status   Yeast Wet Prep HPF POC NONE SEEN NONE SEEN Final   Trich, Wet Prep NONE SEEN NONE  SEEN Final   Clue Cells Wet Prep HPF POC PRESENT (A) NONE SEEN Final   WBC, Wet Prep HPF POC MANY (A) NONE SEEN Final    Comment: MODERATE BACTERIA SEEN   Sperm NONE SEEN  Final  Culture, Urine     Status: Abnormal   Collection Time: 01/25/16 10:09 AM  Result Value Ref Range Status   Specimen Description URINE, RANDOM  Final   Special Requests NONE  Final   Culture (A)  Final    1,000 COLONIES/mL INSIGNIFICANT GROWTH Performed at [facility]    Report Status 01/26/2016 FINAL  Final         Radiology Studies: US Ob Limited  01/25/2016  CLINICAL DATA:  Vomiting.  Alcohol use. EXAM: LIMITED OBSTETRIC ULTRASOUND COMPARISON:  12/30/2015 FINDINGS: Number of Fetuses:  1 Heart Rate:  163 bpm Movement:  Yes Presentation: Transverse Placental Location: Posterior Previa: No Amniotic Fluid (Subjective):  Within normal limits. BPD:  2.82cm 15w  0d MATERNAL FINDINGS: Cervix:  Appears closed. Uterus/Adnexae:  No abnormality visualized. IMPRESSION: Single living intrauterine gestation as above. This exam is performed on an emergent basis and does not comprehensively evaluate fetal size, dating, or anatomy; follow-up complete OB US should be considered if further fetal assessment is warranted. Electronically Signed   By: Logan Bores  M.D.   On: 01/25/2016 13:55      Scheduled Meds: . antiseptic oral rinse  7 mL Mouth Rinse q12n4p  . chlorhexidine  15 mL Mouth Rinse BID  . cyanocobalamin  1,000 mcg Subcutaneous Daily  . folic acid  2 mg Intravenous Daily  . LORazepam  0-4 mg Intravenous Q12H  . pantoprazole  40 mg Oral Daily  . prenatal multivitamin  1 tablet Oral Daily  . sodium chloride flush  3 mL Intravenous Q12H  . sucralfate  1 g Oral TID WC & HS  . thiamine  100 mg Oral Daily   Or  . thiamine  100 mg Intravenous Daily   Continuous Infusions: . sodium chloride 100 mL/hr at 01/26/16 2330     LOS: 2 days    Time spent in minutes: 61    [place], MD [facility] Pager: [email or web address] Password [facility] 01/27/2016, 10:39 AM
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
## 2016-01-28 DIAGNOSIS — D529 Folate deficiency anemia, unspecified: Secondary | ICD-10-CM

## 2016-01-28 DIAGNOSIS — F1094 Alcohol use, unspecified with alcohol-induced mood disorder: Secondary | ICD-10-CM

## 2016-01-28 LAB — GLUCOSE, CAPILLARY: GLUCOSE-CAPILLARY: 92 mg/dL (ref 65–99)

## 2016-01-28 MED ORDER — LORAZEPAM 2 MG/ML IJ SOLN
1.0000 mg | Freq: Four times a day (QID) | INTRAMUSCULAR | Status: DC | PRN
  Administered 2016-01-28: 1 mg via INTRAVENOUS

## 2016-01-28 MED ORDER — CLONAZEPAM 0.5 MG PO TABS
0.5000 mg | ORAL_TABLET | Freq: Two times a day (BID) | ORAL | Status: DC
  Administered 2016-01-28 – 2016-02-01 (×8): 0.5 mg via ORAL
  Filled 2016-01-28 (×8): qty 1

## 2016-01-28 NOTE — Progress Notes (Signed)
ED charge nurse came up to assess fetal HR.  Fetal HE 120. Siobhan Zaro A

## 2016-01-28 NOTE — Progress Notes (Signed)
PROGRESS NOTE    Kristine Macias  QJJ:[id number] DOB: 1985/10/14 DOA: 01/25/2016  PCP: Leonides Sake, MD   Brief Narrative:  Kristine Macias is a 30 y.o. female [redacted] weeks pregnant, with medical history significant of depression and anxiety, alcoholic hepatitis, alcohol withdrawal seizures, thrombocytopenia, history of breast cancer age 1 status post lumpectomy, congenital deafness, ongoing alcohol abuse which is recurrent, who presented to the ED abdominal pain, nausea and emesis which have been ongoing for the past week. Patient endorses subjective fevers, chills, nausea, vomiting, some shortness of breath, loose stools. Patient states last drink was the night prior to admission. Patient endorses drinking 4-5, 24 ounces of beer daily. She states she was trying to wean herself of her Clonazepam as well.   Subjective: Nausea. No vomiting. Continues to be anxious. Thinking about going to inpatient rehab. Resource were provided but she has not called yet. Discussed fetal alcohol syndrome with her in detail.   Assessment & Plan:   Principal Problem:   Alcohol withdrawal with seizure/ Alcohol use disorder, severe, dependence - CIWA scale  - needs residential rehab - I will not discharge her until this can be arranged- discussed with her and her boyfriend today - have advised boyfriend to take away her car keys and her cash to prevent her from obtaining alcohol  Active Problems:      Generalized anxiety disorder - wants to stop the Klonipin but now quite anxious- cont ativan- psych consult requested- may need to start SSRI  Acute alcoholic hepatitis -  LFTs improving    Protein-calorie malnutrition, severe   - add ensure    Intractable nausea and vomiting - Zofran    Hypokalemia - replace      Pregnant - most recent ultrasound a couple of wks ago shows normal size of fetus-partial ultrasound on 5/31 reveals viable fetus, closed cervix - also noted low folic acid levels  putting baby at risk for neural tube defects- replacing with high dose IV Folic acid    Anemia - multivitamins - low normal G81 and low folic acid- give E56 daily subcu- started IV folic acid  DVT prophylaxis: scds  Code Status: full code Family Communication:  Disposition Plan: hopefully residential rehab Consultants:   psych Procedures:    Antimicrobials:  Anti-infectives    None       Objective: Filed Vitals:   01/27/16 1240 01/27/16 1858 01/27/16 2303 01/28/16 0644  BP: 112/74 120/82 109/70 99/52  Pulse: 107 94 93 85  Temp: 98.4 F (36.9 C) 98.5 F (36.9 C) 98.7 F (37.1 C) 98.3 F (36.8 C)  TempSrc: Oral Oral Oral Oral  Resp:  20 20 20   Height:      Weight:    46.857 kg (103 lb 4.8 oz)  SpO2: 100% 100% 100% 99%    Intake/Output Summary (Last 24 hours) at 01/28/16 1004 Last data filed at 01/27/16 1900  Gross per 24 hour  Intake 1656.67 ml  Output      0 ml  Net 1656.67 ml   Filed Weights   01/26/16 0610 01/27/16 0448 01/28/16 0644  Weight: 49.533 kg (109 lb 3.2 oz) 49.714 kg (109 lb 9.6 oz) 46.857 kg (103 lb 4.8 oz)    Examination: General exam: Appears anxious HEENT: PERRLA, oral mucosa moist, no sclera icterus or thrush Respiratory system: Clear to auscultation. Respiratory effort normal. Cardiovascular system: S1 & S2 heard, RRR.  No murmurs  Gastrointestinal system: Abdomen soft, non-tender, nondistended. Normal bowel sound. No organomegaly  Central nervous system: Alert and oriented. No focal neurological deficits. Extremities: No cyanosis, clubbing or edema Skin: No rashes or ulcers Psychiatry:  Mood & affect appropriate.     Data Reviewed: I have personally reviewed following labs and imaging studies  CBC:  Recent Labs Lab 01/25/16 0430 01/25/16 1030 01/26/16 0507 01/27/16 0437  WBC 7.9 6.9 6.6 10.3  NEUTROABS 5.4 4.2  --   --   HGB 9.5* 9.0* 8.4* 8.7*  HCT 27.1* 26.0* 24.7* 25.8*  MCV 90.9 90.3 93.6 95.6  PLT 136* 132* 125*  096*   Basic Metabolic Panel:  Recent Labs Lab 01/25/16 0430 01/25/16 1030 01/25/16 1618 01/26/16 0507 01/27/16 0437  NA 136 135  --  134* 134*  K 2.4* 3.2*  --  3.0* 3.9  CL 99* 103  --  104 105  CO2 28 24  --  24 22  GLUCOSE 110* 104*  --  81 85  BUN <5* <5*  --  <5* <5*  CREATININE <0.30* <0.30*  --  <0.30* <0.30*  CALCIUM 8.2* 7.5*  --  7.3* 8.5*  MG  --  1.3*  --  2.0  --   PHOS  --   --  5.1*  --   --    GFR: CrCl cannot be calculated (Patient has no serum creatinine result on file.). Liver Function Tests:  Recent Labs Lab 01/25/16 0430 01/25/16 1030 01/26/16 0507 01/27/16 0437  AST 141* 120* 127* 84*  ALT 47 42 37 33  ALKPHOS 177* 172* 152* 156*  BILITOT 0.5 0.7 1.5* 0.9  PROT 6.4* 6.2* 5.6* 6.5  ALBUMIN 2.9* 2.8* 2.6* 2.8*   No results for input(s): LIPASE, AMYLASE in the last 168 hours. No results for input(s): AMMONIA in the last 168 hours. Coagulation Profile: No results for input(s): INR, PROTIME in the last 168 hours. Cardiac Enzymes: No results for input(s): CKTOTAL, CKMB, CKMBINDEX, TROPONINI in the last 168 hours. BNP (last 3 results) No results for input(s): PROBNP in the last 8760 hours. HbA1C: No results for input(s): HGBA1C in the last 72 hours. CBG:  Recent Labs Lab 01/26/16 0800 01/27/16 0748 01/28/16 0725  GLUCAP 90 82 92   Lipid Profile: No results for input(s): CHOL, HDL, LDLCALC, TRIG, CHOLHDL, LDLDIRECT in the last 72 hours. Thyroid Function Tests: No results for input(s): TSH, T4TOTAL, FREET4, T3FREE, THYROIDAB in the last 72 hours. Anemia Panel:  Recent Labs  01/25/16 1618  VITAMINB12 240  FOLATE 2.1*  FERRITIN 246  TIBC 197*  IRON 179*   Urine analysis:    Component Value Date/Time   COLORURINE YELLOW 01/25/2016 1009   COLORURINE Yellow 12/20/2014 1018   APPEARANCEUR CLEAR 01/25/2016 1009   APPEARANCEUR Clear 12/20/2014 1018   LABSPEC 1.016 01/25/2016 1009   LABSPEC 1.006 12/20/2014 1018   PHURINE 7.0  01/25/2016 1009   PHURINE 6.0 12/20/2014 1018   GLUCOSEU NEGATIVE 01/25/2016 1009   GLUCOSEU Negative 12/20/2014 [place] 01/25/2016 1009   HGBUR 1+ 12/20/2014 [place] 01/25/2016 1009   BILIRUBINUR neg 12/29/2015 1501   BILIRUBINUR Negative 12/20/2014 1018   KETONESUR NEGATIVE 01/25/2016 1009   KETONESUR Negative 12/20/2014 1018   PROTEINUR NEGATIVE 01/25/2016 1009   PROTEINUR trace 12/29/2015 1501   PROTEINUR Negative 12/20/2014 1018   UROBILINOGEN negative 12/29/2015 1501   UROBILINOGEN 0.2 06/13/2012 2306   NITRITE NEGATIVE 01/25/2016 1009   NITRITE neg 12/29/2015 1501   NITRITE Negative 12/20/2014 1018   LEUKOCYTESUR NEGATIVE 01/25/2016 1009  LEUKOCYTESUR Negative 12/20/2014 1018   Sepsis Labs: @LABRCNTIP (procalcitonin:4,lacticidven:4) ) Recent Results (from the past 240 hour(s))  Wet prep, genital     Status: Abnormal   Collection Time: 01/20/16  1:36 AM  Result Value Ref Range Status   Yeast Wet Prep HPF POC NONE SEEN NONE SEEN Final   Trich, Wet Prep NONE SEEN NONE SEEN Final   Clue Cells Wet Prep HPF POC PRESENT (A) NONE SEEN Final   WBC, Wet Prep HPF POC MANY (A) NONE SEEN Final    Comment: MODERATE BACTERIA SEEN   Sperm NONE SEEN  Final  Culture, Urine     Status: Abnormal   Collection Time: 01/25/16 10:09 AM  Result Value Ref Range Status   Specimen Description URINE, RANDOM  Final   Special Requests NONE  Final   Culture (A)  Final    1,000 COLONIES/mL INSIGNIFICANT GROWTH Performed at [facility]    Report Status 01/26/2016 FINAL  Final         Radiology Studies: No results found.    Scheduled Meds: . antiseptic oral rinse  7 mL Mouth Rinse q12n4p  . chlorhexidine  15 mL Mouth Rinse BID  . cyanocobalamin  1,000 mcg Subcutaneous Daily  . folic acid  2 mg Intravenous Daily  . LORazepam  0-4 mg Intravenous Q12H  . pantoprazole  40 mg Oral Daily  . prenatal multivitamin  1 tablet Oral Daily  . sodium  chloride flush  3 mL Intravenous Q12H  . sucralfate  1 g Oral TID WC & HS  . thiamine  100 mg Oral Daily   Or  . thiamine  100 mg Intravenous Daily   Continuous Infusions: . sodium chloride 100 mL/hr at 01/28/16 0249     LOS: 3 days    Time spent in minutes: 40    [place], MD [facility] Pager: [email or web address] Password [email or web address] 01/28/2016, 10:04 AM

## 2016-01-28 NOTE — Progress Notes (Signed)
Pt complains of lower abdominal cramping similar to "braxton hicks". Pt requesting someone to check fetal heart tones and said that no one had checked fetal heart tones since she had been here.  MD paged. Orders received. Johnathyn Viscomi A

## 2016-01-29 DIAGNOSIS — D696 Thrombocytopenia, unspecified: Secondary | ICD-10-CM

## 2016-01-29 MED ORDER — LORAZEPAM 2 MG/ML IJ SOLN
0.5000 mg | Freq: Four times a day (QID) | INTRAMUSCULAR | Status: DC | PRN
  Administered 2016-01-29 – 2016-01-30 (×4): 0.5 mg via INTRAVENOUS
  Filled 2016-01-29 (×4): qty 1

## 2016-01-29 NOTE — Progress Notes (Signed)
PROGRESS NOTE    Kristine Macias  IHK:[id number] DOB: [birth year] DOA: 01/25/2016  PCP: Leonides Sake, MD   Brief Narrative:  Kristine Macias is a 30 y.o. female [redacted] weeks pregnant, with medical history significant of depression and anxiety, alcoholic hepatitis, alcohol withdrawal seizures, thrombocytopenia, history of breast cancer age 1 status post lumpectomy, congenital deafness, ongoing alcohol abuse which is recurrent, who presented to the ED abdominal pain, nausea and emesis which have been ongoing for the past week. Patient endorses subjective fevers, chills, nausea, vomiting, some shortness of breath, loose stools. Patient states last drink was the night prior to admission. Patient endorses drinking 4-5, 24 ounces of beer daily. She states she was trying to wean herself of her Clonazepam as well.   Subjective: Mild anxiety. No other complaints. Will be working on paperwork to go to residential rehab in [place].   Assessment & Plan:   Principal Problem:   Alcohol withdrawal with seizure/ Alcohol use disorder, severe, dependence - CIWA scale - today is day 5 of admission- nearing the end of her withdrawal- cut Ativan back to 0.5 mg - added Clonazepam - needs residential rehab - I will not discharge her until this can be arranged- discussed with her and her boyfriend today - have advised boyfriend to take away her car keys and her cash to prevent her from obtaining alcohol  Active Problems:      Generalized anxiety disorder - resumed Clonazepam at 0.5 BID which is a dose that was keeping her anxiety under control at home  Acute alcoholic hepatitis -  LFTs improving    Protein-calorie malnutrition, severe   - add ensure    Intractable nausea and vomiting - Zofran    Hypokalemia - replace      Pregnant - most recent ultrasound a couple of wks ago shows normal size of fetus - partial ultrasound on 5/31 reveals viable fetus, closed cervix - also noted low folic  acid levels putting baby at risk for neural tube defects- replacing with high dose IV Folic acid - had some abdominal cramping yesterday - Fetal Heart rate was ok    Anemia - multivitamins - low normal V56 and low folic acid- give E33 daily subcu- started IV folic acid  DVT prophylaxis: scds  Code Status: full code Family Communication: husband at bedside Disposition Plan: residential rehab Consultants:   psych Procedures:    Antimicrobials:  Anti-infectives    None       Objective: Filed Vitals:   01/28/16 0644 01/28/16 1335 01/28/16 2343 01/29/16 0704  BP: 99/52 110/60 103/65 99/57  Pulse: 85 115 92 79  Temp: 98.3 F (36.8 C) 98.6 F (37 C) 98.1 F (36.7 C) 98 F (36.7 C)  TempSrc: Oral Oral Oral Oral  Resp: 20 22 20 20   Height:      Weight: 46.857 kg (103 lb 4.8 oz)   50.485 kg (111 lb 4.8 oz)  SpO2: 99% 99% 100% 100%    Intake/Output Summary (Last 24 hours) at 01/29/16 0906 Last data filed at 01/28/16 2344  Gross per 24 hour  Intake   3160 ml  Output      0 ml  Net   3160 ml   Filed Weights   01/27/16 0448 01/28/16 0644 01/29/16 0704  Weight: 49.714 kg (109 lb 9.6 oz) 46.857 kg (103 lb 4.8 oz) 50.485 kg (111 lb 4.8 oz)    Examination: General exam: Appears calm, smiling today HEENT: PERRLA, oral mucosa moist, no  sclera icterus or thrush Respiratory system: Clear to auscultation. Respiratory effort normal. Cardiovascular system: S1 & S2 heard, RRR.  No murmurs  Gastrointestinal system: Abdomen soft, non-tender, nondistended. Normal bowel sound. No organomegaly Central nervous system: Alert and oriented. No focal neurological deficits. Extremities: No cyanosis, clubbing or edema Skin: No rashes or ulcers Psychiatry:  Mood & affect appropriate.     Data Reviewed: I have personally reviewed following labs and imaging studies  CBC:  Recent Labs Lab 01/25/16 0430 01/25/16 1030 01/26/16 0507 01/27/16 0437  WBC 7.9 6.9 6.6 10.3  NEUTROABS 5.4  4.2  --   --   HGB 9.5* 9.0* 8.4* 8.7*  HCT 27.1* 26.0* 24.7* 25.8*  MCV 90.9 90.3 93.6 95.6  PLT 136* 132* 125* 852*   Basic Metabolic Panel:  Recent Labs Lab 01/25/16 0430 01/25/16 1030 01/25/16 1618 01/26/16 0507 01/27/16 0437  NA 136 135  --  134* 134*  K 2.4* 3.2*  --  3.0* 3.9  CL 99* 103  --  104 105  CO2 28 24  --  24 22  GLUCOSE 110* 104*  --  81 85  BUN <5* <5*  --  <5* <5*  CREATININE <0.30* <0.30*  --  <0.30* <0.30*  CALCIUM 8.2* 7.5*  --  7.3* 8.5*  MG  --  1.3*  --  2.0  --   PHOS  --   --  5.1*  --   --    GFR: CrCl cannot be calculated (Patient has no serum creatinine result on file.). Liver Function Tests:  Recent Labs Lab 01/25/16 0430 01/25/16 1030 01/26/16 0507 01/27/16 0437  AST 141* 120* 127* 84*  ALT 47 42 37 33  ALKPHOS 177* 172* 152* 156*  BILITOT 0.5 0.7 1.5* 0.9  PROT 6.4* 6.2* 5.6* 6.5  ALBUMIN 2.9* 2.8* 2.6* 2.8*   No results for input(s): LIPASE, AMYLASE in the last 168 hours. No results for input(s): AMMONIA in the last 168 hours. Coagulation Profile: No results for input(s): INR, PROTIME in the last 168 hours. Cardiac Enzymes: No results for input(s): CKTOTAL, CKMB, CKMBINDEX, TROPONINI in the last 168 hours. BNP (last 3 results) No results for input(s): PROBNP in the last 8760 hours. HbA1C: No results for input(s): HGBA1C in the last 72 hours. CBG:  Recent Labs Lab 01/26/16 0800 01/27/16 0748 01/28/16 0725  GLUCAP 90 82 92   Lipid Profile: No results for input(s): CHOL, HDL, LDLCALC, TRIG, CHOLHDL, LDLDIRECT in the last 72 hours. Thyroid Function Tests: No results for input(s): TSH, T4TOTAL, FREET4, T3FREE, THYROIDAB in the last 72 hours. Anemia Panel: No results for input(s): VITAMINB12, FOLATE, FERRITIN, TIBC, IRON, RETICCTPCT in the last 72 hours. Urine analysis:    Component Value Date/Time   COLORURINE YELLOW 01/25/2016 1009   COLORURINE Yellow 12/20/2014 1018   APPEARANCEUR CLEAR 01/25/2016 1009    APPEARANCEUR Clear 12/20/2014 1018   LABSPEC 1.016 01/25/2016 1009   LABSPEC 1.006 12/20/2014 1018   PHURINE 7.0 01/25/2016 1009   PHURINE 6.0 12/20/2014 1018   GLUCOSEU NEGATIVE 01/25/2016 1009   GLUCOSEU Negative 12/20/2014 [place] 01/25/2016 1009   HGBUR 1+ 12/20/2014 [place] 01/25/2016 1009   BILIRUBINUR neg 12/29/2015 1501   BILIRUBINUR Negative 12/20/2014 1018   KETONESUR NEGATIVE 01/25/2016 1009   KETONESUR Negative 12/20/2014 1018   PROTEINUR NEGATIVE 01/25/2016 1009   PROTEINUR trace 12/29/2015 1501   PROTEINUR Negative 12/20/2014 1018   UROBILINOGEN negative 12/29/2015 1501   UROBILINOGEN 0.2  06/13/2012 2306   NITRITE NEGATIVE 01/25/2016 1009   NITRITE neg 12/29/2015 1501   NITRITE Negative 12/20/2014 1018   LEUKOCYTESUR NEGATIVE 01/25/2016 1009   LEUKOCYTESUR Negative 12/20/2014 1018   Sepsis Labs: @LABRCNTIP (procalcitonin:4,lacticidven:4) ) Recent Results (from the past 240 hour(s))  Wet prep, genital     Status: Abnormal   Collection Time: 01/20/16  1:36 AM  Result Value Ref Range Status   Yeast Wet Prep HPF POC NONE SEEN NONE SEEN Final   Trich, Wet Prep NONE SEEN NONE SEEN Final   Clue Cells Wet Prep HPF POC PRESENT (A) NONE SEEN Final   WBC, Wet Prep HPF POC MANY (A) NONE SEEN Final    Comment: MODERATE BACTERIA SEEN   Sperm NONE SEEN  Final  Culture, Urine     Status: Abnormal   Collection Time: 01/25/16 10:09 AM  Result Value Ref Range Status   Specimen Description URINE, RANDOM  Final   Special Requests NONE  Final   Culture (A)  Final    1,000 COLONIES/mL INSIGNIFICANT GROWTH Performed at [facility]    Report Status 01/26/2016 FINAL  Final         Radiology Studies: No results found.    Scheduled Meds: . antiseptic oral rinse  7 mL Mouth Rinse q12n4p  . chlorhexidine  15 mL Mouth Rinse BID  . clonazePAM  0.5 mg Oral BID  . cyanocobalamin  1,000 mcg Subcutaneous Daily  . folic acid  2 mg  Intravenous Daily  . pantoprazole  40 mg Oral Daily  . prenatal multivitamin  1 tablet Oral Daily  . sodium chloride flush  3 mL Intravenous Q12H  . thiamine  100 mg Oral Daily   Or  . thiamine  100 mg Intravenous Daily   Continuous Infusions: . sodium chloride 100 mL/hr at 01/29/16 0048     LOS: 4 days    Time spent in minutes: 74    [place], MD [facility] Pager: [email or web address] Password [email or web address] 01/29/2016, 9:06 AM

## 2016-01-29 NOTE — Progress Notes (Addendum)
Per MD note pt is now agreeable to go into rehab program- prefers [facility] in [place], [place].  CSW confirmed with pt that she is now agreeable to rehab- CSW attempted to make referral with [facility] (referral line- [phone]) referral line closed over the weekend- weekday CSW will need to call and make referral (office opens at [place])  Other rehab facilities that have specific programs also identified:  [facility], [place], [place]- [phone], [place], [phone]  **patient wants to ensure she is informed of rules and regulations of these facilities before committing, also does not want to go somewhere where she does not have control of when she leaves.  Unable to make referral on Sunday- will need weekday follow up  [facility] MSW, [place] #: [phone]

## 2016-01-30 DIAGNOSIS — F410 Panic disorder [episodic paroxysmal anxiety] without agoraphobia: Secondary | ICD-10-CM

## 2016-01-30 DIAGNOSIS — R197 Diarrhea, unspecified: Secondary | ICD-10-CM

## 2016-01-30 DIAGNOSIS — F19230 Other psychoactive substance dependence with withdrawal, uncomplicated: Secondary | ICD-10-CM

## 2016-01-30 DIAGNOSIS — F10231 Alcohol dependence with withdrawal delirium: Secondary | ICD-10-CM

## 2016-01-30 LAB — GLUCOSE, CAPILLARY: GLUCOSE-CAPILLARY: 81 mg/dL (ref 65–99)

## 2016-01-30 MED ORDER — CYANOCOBALAMIN 1000 MCG/ML IJ SOLN
1000.0000 ug | Freq: Every day | INTRAMUSCULAR | Status: AC
  Administered 2016-01-31: 1000 ug via SUBCUTANEOUS
  Filled 2016-01-30: qty 1

## 2016-01-30 MED ORDER — VITAMIN B-12 1000 MCG PO TABS: 1000.0000 ug | ORAL_TABLET | Freq: Every day | ORAL | Status: DC

## 2016-01-30 MED ORDER — FOLIC ACID 1 MG PO TABS: 1.0000 mg | ORAL_TABLET | Freq: Every day | ORAL | Status: DC

## 2016-01-30 MED ORDER — THIAMINE HCL 100 MG PO TABS: 100.0000 mg | ORAL_TABLET | Freq: Every day | ORAL | Status: DC

## 2016-01-30 MED ORDER — SENNOSIDES-DOCUSATE SODIUM 8.6-50 MG PO TABS: 1.0000 | ORAL_TABLET | Freq: Every evening | ORAL | Status: DC | PRN

## 2016-01-30 NOTE — Discharge Summary (Addendum)
Physician Discharge Summary  Kristine Macias URK:[id number] DOB: 04/09/1986 DOA: 01/25/2016  PCP: Leonides Sake, MD  Admit date: 01/25/2016 Discharge date: 01/30/2016  Time spent: 60 minutes  Recommendations for Outpatient Follow-up:  1. Close follow up   Discharge Condition: stable     Discharge Diagnoses:  Principal Problem:   Alcohol withdrawal seizure-  Alcohol abuse Active Problems:   Generalized anxiety disorder   Alcoholic hepatitis without ascites   Protein-calorie malnutrition, severe (HCC)   Intractable nausea and vomiting   Hypokalemia   Thrombocytopenia (HCC)   Tobacco abuse   Pregnant   Anemia- G31 and Folic acid deficiency   Nicotine abuse  Brief Narrative:  Kristine Macias is a 30 y.o. female [redacted] weeks pregnant, with medical history significant of depression and anxiety, alcoholic hepatitis, alcohol withdrawal seizures, thrombocytopenia, history of breast cancer age 30 status post lumpectomy, congenital deafness, ongoing alcohol abuse which is recurrent, who presented to the ED abdominal pain, nausea and emesis which have been ongoing for the past week. Patient endorses subjective fevers, chills, nausea, vomiting, some shortness of breath, loose stools. Patient states last drink was the night prior to admission. Patient endorses drinking 4-5, 24 ounces of beer daily. She states she was trying to wean herself of her Clonazepam as well.    Assessment & Plan:  Principal Problem:  Alcohol withdrawal with seizure/ Alcohol use disorder, severe, dependence - withdrawal has resolved- resumed home dose of Clonazepam - extensive counseling done again regarding alcohol abuse while pregnant, literature about fetal alcohol syndrome given, spoken with husband in detail as well to prevent her for gaining access to alcohol - DSS involved as she has 2 children at home  Active Problems:    Generalized anxiety disorder - resumed Clonazepam at 0.5 BID which is a dose  that was keeping her anxiety under control at home  Acute alcoholic hepatitis - LFTs improving   Protein-calorie malnutrition, severe  - add ensure   Intractable nausea and vomiting - due to pregnancy - Zofran   Hypokalemia - replace d   Pregnant - most recent ultrasound a couple of wks ago shows normal size of fetus - partial ultrasound on 5/31 reveals viable fetus, closed cervix - also noted low folic acid levels putting baby at risk for neural tube defects- replacing with high dose IV Folic acid - had some abdominal cramping  - Fetal Heart rate was ok   Anemia - multivitamins - low normal B12 (517) and low folic acid 2.1) - given B12 subcu and 61m IV folic acid daily - will transition to oral  -cont prenatal vitamins as well  Nicotine abuse - advised to stop smoking    Discharge Exam: Filed Weights   01/28/16 0644 01/29/16 0704 01/30/16 0646  Weight: 46.857 kg (103 lb 4.8 oz) 50.485 kg (111 lb 4.8 oz) 50.712 kg (111 lb 12.8 oz)   Filed Vitals:   01/30/16 1040 01/30/16 1200  BP: 102/45 105/54  Pulse: 88 80  Temp:    Resp:      General: AAO x 3, no distress, mood is depressed, appears cachectic  Cardiovascular: RRR, no murmurs  Respiratory: clear to auscultation bilaterally GI: soft, non-tender, non-distended, bowel sound positive  Discharge Instructions You were cared for by a hospitalist during your hospital stay. If you have any questions about your discharge medications or the care you received while you were in the hospital after you are discharged, you can call the unit and asked to speak with the  hospitalist on call if the hospitalist that took care of you is not available. Once you are discharged, your primary care physician will handle any further medical issues. Please note that NO REFILLS for any discharge medications will be authorized once you are discharged, as it is imperative that you return to your primary care physician (or establish a  relationship with a primary care physician if you do not have one) for your aftercare needs so that they can reassess your need for medications and monitor your lab values.     Medication List    STOP taking these medications        metoCLOPramide 10 MG tablet  Commonly known as:  REGLAN     metroNIDAZOLE 0.75 % vaginal gel  Commonly known as:  METROGEL     promethazine 25 MG tablet  Commonly known as:  PHENERGAN     sucralfate 1 GM/10ML suspension  Commonly known as:  CARAFATE      TAKE these medications        clonazePAM 0.25 MG disintegrating tablet  Commonly known as:  KLONOPIN  Take 0.25 mg by mouth 2 (two) times daily.     folic acid 1 MG tablet  Commonly known as:  FOLVITE  Take 1 tablet (1 mg total) by mouth daily.     ondansetron 8 MG disintegrating tablet  Commonly known as:  ZOFRAN ODT  Take 1 tablet (8 mg total) by mouth every 8 (eight) hours as needed for nausea or vomiting.     pantoprazole 40 MG tablet  Commonly known as:  PROTONIX  Take 1 tablet (40 mg total) by mouth daily.     prenatal multivitamin Tabs tablet  Take 1 tablet by mouth daily.     senna-docusate 8.6-50 MG tablet  Commonly known as:  Senokot-S  Take 1 tablet by mouth at bedtime as needed for mild constipation.     thiamine 100 MG tablet  Take 1 tablet (100 mg total) by mouth daily.     vitamin B-12 1000 MCG tablet  Commonly known as:  CYANOCOBALAMIN  Take 1 tablet (1,000 mcg total) by mouth daily.       Allergies  Allergen Reactions  . Aspirin Shortness Of Breath  . Effexor [Venlafaxine] Anaphylaxis  . Sulfa Antibiotics Anaphylaxis  . Tetracyclines & Related Anaphylaxis  . Trazodone And Nefazodone Anaphylaxis  . Latex Hives and Itching  . Paxil [Paroxetine] Hives and Itching  . Seroquel [Quetiapine Fumarate] Hives and Itching  . Zoloft [Sertraline Hcl] Hives and Itching      The results of significant diagnostics from this hospitalization (including imaging,  microbiology, ancillary and laboratory) are listed below for reference.    Significant Diagnostic Studies: US Ob Limited  01/25/2016  CLINICAL DATA:  Vomiting.  Alcohol use. EXAM: LIMITED OBSTETRIC ULTRASOUND COMPARISON:  12/30/2015 FINDINGS: Number of Fetuses:  1 Heart Rate:  163 bpm Movement:  Yes Presentation: Transverse Placental Location: Posterior Previa: No Amniotic Fluid (Subjective):  Within normal limits. BPD:  2.82cm 15w  0d MATERNAL FINDINGS: Cervix:  Appears closed. Uterus/Adnexae:  No abnormality visualized. IMPRESSION: Single living intrauterine gestation as above. This exam is performed on an emergent basis and does not comprehensively evaluate fetal size, dating, or anatomy; follow-up complete OB US should be considered if further fetal assessment is warranted. Electronically Signed   By: Logan Bores M.D.   On: 01/25/2016 13:55    Microbiology: Recent Results (from the past 240 hour(s))  Culture, Urine  Status: Abnormal   Collection Time: 01/25/16 10:09 AM  Result Value Ref Range Status   Specimen Description URINE, RANDOM  Final   Special Requests NONE  Final   Culture (A)  Final    1,000 COLONIES/mL INSIGNIFICANT GROWTH Performed at [facility]    Report Status 01/26/2016 FINAL  Final     Labs: Basic Metabolic Panel:  Recent Labs Lab 01/25/16 0430 01/25/16 1030 01/25/16 1618 01/26/16 0507 01/27/16 0437  NA 136 135  --  134* 134*  K 2.4* 3.2*  --  3.0* 3.9  CL 99* 103  --  104 105  CO2 28 24  --  24 22  GLUCOSE 110* 104*  --  81 85  BUN <5* <5*  --  <5* <5*  CREATININE <0.30* <0.30*  --  <0.30* <0.30*  CALCIUM 8.2* 7.5*  --  7.3* 8.5*  MG  --  1.3*  --  2.0  --   PHOS  --   --  5.1*  --   --    Liver Function Tests:  Recent Labs Lab 01/25/16 0430 01/25/16 1030 01/26/16 0507 01/27/16 0437  AST 141* 120* 127* 84*  ALT 47 42 37 33  ALKPHOS 177* 172* 152* 156*  BILITOT 0.5 0.7 1.5* 0.9  PROT 6.4* 6.2* 5.6* 6.5  ALBUMIN 2.9* 2.8* 2.6*  2.8*   No results for input(s): LIPASE, AMYLASE in the last 168 hours. No results for input(s): AMMONIA in the last 168 hours. CBC:  Recent Labs Lab 01/25/16 0430 01/25/16 1030 01/26/16 0507 01/27/16 0437  WBC 7.9 6.9 6.6 10.3  NEUTROABS 5.4 4.2  --   --   HGB 9.5* 9.0* 8.4* 8.7*  HCT 27.1* 26.0* 24.7* 25.8*  MCV 90.9 90.3 93.6 95.6  PLT 136* 132* 125* 140*   Cardiac Enzymes: No results for input(s): CKTOTAL, CKMB, CKMBINDEX, TROPONINI in the last 168 hours. BNP: BNP (last 3 results)  Recent Labs  10/04/15 2021  BNP 128.0*    ProBNP (last 3 results) No results for input(s): PROBNP in the last 8760 hours.  CBG:  Recent Labs Lab 01/26/16 0800 01/27/16 0748 01/28/16 0725 01/30/16 0735  GLUCAP 90 82 92 81       SignedDebbe Odea, MD [facility] 01/30/2016, 12:43 PM

## 2016-01-30 NOTE — Progress Notes (Signed)
LCSW met with patient at the bedside. She confirms she is interested in [place] program at [facility]. LCSW gave patient packet to complete with time frame of this afternoon of when it needed to be completed in effort to make referral.   Packet left in room. LCSW will follow up with referral and timeframe of when she could possibly be accepted. From discussion with facility last week, the earilest would be admission in 4-5 weeks pending acute need.    Will follow up.  Lane Hacker, MSW Clinical Social Work: System [employer] [phone]

## 2016-01-30 NOTE — Discharge Instructions (Signed)
Do not drink any alcohol or smoke cigarettes

## 2016-01-30 NOTE — Progress Notes (Signed)
LCSW has met with patient twice today to discuss discharge and disposition. Patient is agreeable to send referral to [facility]. Patient has also completed all paperwork for referral and called to obtain more information. Patient reports today she received letter from landlord reporting she is evicted due to late payments of rent. 2 months ago patient reports she had car trouble, had to fix the car and became behind in paying rent. Boyfriend and grandmother (patient mother currently work).  They will assist with coming up with money with regard to eviction.  LCSW also made patient aware of [facility] in effort to pay bill/bill assistance. Patient gets food stamps and also working on SSI for disability.  Referral completed for patient as of 01/30/16.  Faxed with permission from patient.  Lane Hacker, MSW Clinical Social Work: [employer] [phone]

## 2016-01-31 LAB — GLUCOSE, CAPILLARY: Glucose-Capillary: 84 mg/dL (ref 65–99)

## 2016-01-31 MED ORDER — WITCH HAZEL-GLYCERIN EX PADS
MEDICATED_PAD | CUTANEOUS | Status: DC | PRN
  Administered 2016-01-31: via TOPICAL
  Filled 2016-01-31: qty 100

## 2016-01-31 NOTE — Progress Notes (Signed)
Discharge summary done yesterday. However, due to high risk for fetal damage as a result of her ongoing drinking and smoking, I will not be discharging this patient until she has a bed a residential rehab.   Debbe Odea, MD

## 2016-01-31 NOTE — Care Management Note (Signed)
Case Management Note  Patient Details  Name: Levette Paulick MRN: [id number] Date of Birth: [birth year] !@)Subjective/Objective: Per med advisor-who has spoken to [employer] of Non Coverage(HINN 12) given to patient. Attending aware/Dir of [facility] notified.CSW following.                   Action/Plan:d/c plan residential placement.   Expected Discharge Date:   (UNKNOWN)               Expected Discharge Plan:  IP Rehab Facility  In-House Referral:  Clinical Social Work  Discharge planning Services  CM Consult  Post Acute Care Choice:    Choice offered to:     DME Arranged:    DME Agency:     HH Arranged:    [place] Agency:     Status of Service:  In process, will continue to follow  Medicare Important Message Given:    Date Medicare IM Given:    Medicare IM give by:    Date Additional Medicare IM Given:    Additional Medicare Important Message give by:     If discussed at [place] of Stay Meetings, dates discussed:    Additional Comments:  Dessa Phi, RN 01/31/2016, 2:32 PM

## 2016-01-31 NOTE — Care Management Note (Signed)
Case Management Note  Patient Details  Name: Ling Flesch MRN: [id number] Date of Birth: February 09, 1986  Subjective/Objective:  Patient on IVF @ 100. See CSW note about status of residential rehab-no beds available, but patient has a safe d/c to home.Noted Attendings note-No d/c until residential rehab set up.Med advisor will discuss case further w/CSW, then will contact attending.                  Action/Plan:d/c plan home   Expected Discharge Date:   (UNKNOWN)               Expected Discharge Plan:  IP Rehab Facility  In-House Referral:  Clinical Social Work  Discharge planning Services  CM Consult  Post Acute Care Choice:    Choice offered to:     DME Arranged:    DME Agency:     HH Arranged:    [place] Agency:     Status of Service:  In process, will continue to follow  Medicare Important Message Given:    Date Medicare IM Given:    Medicare IM give by:    Date Additional Medicare IM Given:    Additional Medicare Important Message give by:     If discussed at [place] of Stay Meetings, dates discussed:    Additional Comments:  Dessa Phi, RN 01/31/2016, 1:39 PM

## 2016-01-31 NOTE — Progress Notes (Signed)
LCSW completed referral for [facility]. No beds currently for patient, but they are actively assessing her needs. Patient has also called to inquire about status per report.  Patient not agreeable to referral for [facility] at this time. She reports this facility is too far away from her home and children. Currently there are no other bed available for treatment at this time. Referrals can be sent at any time for residental, however patient must wait on a bed to become available. Patient aware of this and LCSW discussed bridge program with CD IOP in home town with [facility]. Patient is very fimilar with treatment facility as she has completed program in the past.  Patient has DC summary. NO barriers to DC at this time. LCSW following for referral and also been in contact with CPS updating on pt progress.  Lane Hacker, MSW Clinical Social Work: [employer] [phone]

## 2016-02-01 ENCOUNTER — Encounter: Payer: Self-pay | Admitting: Obstetrics and Gynecology

## 2016-02-01 DIAGNOSIS — F10120 Alcohol abuse with intoxication, uncomplicated: Secondary | ICD-10-CM

## 2016-02-01 DIAGNOSIS — K7010 Alcoholic hepatitis without ascites: Secondary | ICD-10-CM

## 2016-02-01 DIAGNOSIS — F10239 Alcohol dependence with withdrawal, unspecified: Secondary | ICD-10-CM

## 2016-02-01 DIAGNOSIS — F411 Generalized anxiety disorder: Secondary | ICD-10-CM

## 2016-02-01 DIAGNOSIS — R569 Unspecified convulsions: Secondary | ICD-10-CM

## 2016-02-01 DIAGNOSIS — R1110 Vomiting, unspecified: Secondary | ICD-10-CM

## 2016-02-01 LAB — GLUCOSE, CAPILLARY: GLUCOSE-CAPILLARY: 85 mg/dL (ref 65–99)

## 2016-02-01 MED ORDER — CLONAZEPAM 0.5 MG PO TABS
0.2500 mg | ORAL_TABLET | Freq: Two times a day (BID) | ORAL | Status: DC
  Administered 2016-02-01 – 2016-02-02 (×2): 0.25 mg via ORAL
  Filled 2016-02-01 (×2): qty 1

## 2016-02-01 MED ORDER — SODIUM CHLORIDE 0.9 % IV BOLUS (SEPSIS)
500.0000 mL | Freq: Once | INTRAVENOUS | Status: AC
  Administered 2016-02-01: 500 mL via INTRAVENOUS

## 2016-02-01 NOTE — Progress Notes (Signed)
Update: LCSWA followed up with [facility] and [facility]. No beds currently for patient, but they are actively assessing her needs. Waiting to hear from Program Director.  Kathrin Greathouse, Latanya Presser, MSW Clinical Social Worker 5E and Psychiatric Service Line [phone] 02/01/2016  3:55 PM

## 2016-02-01 NOTE — Progress Notes (Signed)
[facility] Progress Note  Patient: Kristine Macias IOX:[id number]   PCP: Leonides Sake, MD DOB: 01-02-1986   DOA: 01/25/2016   DOS: 02/01/2016   Date of Service: the patient was seen and examined on 02/01/2016  Subjective: The patient opens of dizziness as well as nausea and vomiting. Minimal oral intake. No abdominal pain. Nutrition: Tolerating oral diet,  Brief hospital course: Kristine Macias is a 30 y.o. female [redacted] weeks pregnant, with medical history significant of depression and anxiety, alcoholic hepatitis, alcohol withdrawal seizures, thrombocytopenia, history of breast cancer age 26 status post lumpectomy, congenital deafness, ongoing alcohol abuse which is recurrent, who presented to the ED abdominal pain, nausea and emesis which have been ongoing for the past week. Patient endorses subjective fevers, chills, nausea, vomiting, some shortness of breath, loose stools. Patient states last drink was the night prior to admission. Patient endorses drinking 4-5, 24 ounces of beer daily. She states she was trying to wean herself of her Clonazepam as well. Currently further plan is gradually wean clonazepam to home dose and continue IV hydration.  Assessment and Plan: 1. Alcohol withdrawal with seizure/ Alcohol use disorder, severe, dependence - withdrawal has resolved-  - extensive counseling done again regarding alcohol abuse while pregnant, literature about fetal alcohol syndrome given, spoken with husband in detail as well to prevent her for gaining access to alcohol - DSS involved as she has 2 children at home  2. Generalized anxiety disorder - resumed Clonazepam at 0.5 BID. As per the earlier provider this was the dose that his health and presents anxiety control. Patient's home doses 0.25 mg twice a day and we will reduce her clonazepam 0.25 mg twice a day here in the hospital, monitor for symptom control before discharge  3. Acute alcoholic hepatitis - LFTs improved  based on last LFT 01/27/2016.  4. Protein-calorie malnutrition, severe  - add ensure  5. Intractable nausea and vomiting - due to pregnancy - Zofran  6. Hypokalemia - replaced  7. Pregnant - most recent ultrasound a couple of wks ago shows normal size of fetus - partial ultrasound on 5/31 reveals viable fetus, closed cervix - also noted low folic acid levels putting baby at risk for neural tube defects- replacing with high dose IV Folic acid - had some abdominal cramping - Fetal Heart rate was ok  8. Anemia - multivitamins - low normal B12 (268) and low folic acid 2.1) - given B12 subcu and 22m IV folic acid daily - will transition to oral on discharge -cont prenatal vitamins as well  9. Nicotine abuse - advised to stop smoking  10. Dizziness and lightheadedness. Blood pressure running in the low normal, systolic in 934H Will check orthostatic. Patient received provider cc normal saline bolus. Patient has been receiving 100 mL/h IV fluids.  Pain management: None needed but can use when necessary Tylenol Activity: physical therapy recommended known requirement for home PT or OT Bowel regimen: last BM 01/31/2016 Diet: Regular diet DVT Prophylaxis: mechanical compression device.  Advance goals of care discussion: Full code  Family Communication: no family was present at bedside, at the time of interview.   Disposition:  Discharge to inpatient rehabilitation versus home with home health RN and aide,  Expected discharge date: 02/02/2016  Consultants: Psychiatry Procedures: None  Antibiotics: Anti-infectives    None      Intake/Output Summary (Last 24 hours) at 02/01/16 1509 Last data filed at 02/01/16 1500  Gross per 24 hour  Intake 2753.33 ml  Output  0 ml  Net 2753.33 ml   Filed Weights   01/30/16 0646 01/31/16 0620 02/01/16 0625  Weight: 50.712 kg (111 lb 12.8 oz) 50.803 kg (112 lb) 52.118 kg (114 lb 14.4 oz)   Objective: Physical Exam: Filed  Vitals:   01/31/16 2124 02/01/16 0625 02/01/16 1226 02/01/16 1405  BP: 99/66 96/52 103/67 94/68  Pulse: 85 80 84 84  Temp: 98.6 F (37 C) 98.4 F (36.9 C)  98.2 F (36.8 C)  TempSrc: Oral Oral  Oral  Resp: 18 18  16   Height:      Weight:  52.118 kg (114 lb 14.4 oz)    SpO2: 99% 100%  100%    General: Alert, Awake and Oriented to Time, Place and Person. Appear in mild distress Eyes: PERRL, Conjunctiva normal ENT: Oral Mucosa clear moist. Neck: no JVD, no Abnormal Mass Or lumps Cardiovascular: S1 and S2 Present, no Murmur, Peripheral Pulses Present Respiratory: Bilateral Air entry equal and Decreased, Clear to Auscultation, no Crackles, no wheezes Abdomen: Bowel Sound present, Soft and no tenderness Skin: no redness, n Rash  Extremities: no Pedal edema, no calf tenderness Neurologic: Grossly no focal neuro deficit. Bilaterally Equal motor strength  Data Reviewed: CBC:  Recent Labs Lab 01/26/16 0507 01/27/16 0437  WBC 6.6 10.3  HGB 8.4* 8.7*  HCT 24.7* 25.8*  MCV 93.6 95.6  PLT 125* 977*   Basic Metabolic Panel:  Recent Labs Lab 01/25/16 1618 01/26/16 0507 01/27/16 0437  NA  --  134* 134*  K  --  3.0* 3.9  CL  --  104 105  CO2  --  24 22  GLUCOSE  --  81 85  BUN  --  <5* <5*  CREATININE  --  <0.30* <0.30*  CALCIUM  --  7.3* 8.5*  MG  --  2.0  --   PHOS 5.1*  --   --     Liver Function Tests:  Recent Labs Lab 01/26/16 0507 01/27/16 0437  AST 127* 84*  ALT 37 33  ALKPHOS 152* 156*  BILITOT 1.5* 0.9  PROT 5.6* 6.5  ALBUMIN 2.6* 2.8*   CBG:  Recent Labs Lab 01/27/16 0748 01/28/16 0725 01/30/16 0735 01/31/16 0731 02/01/16 0738  GLUCAP 82 92 81 84 85    Studies: No results found.   Scheduled Meds: . antiseptic oral rinse  7 mL Mouth Rinse q12n4p  . chlorhexidine  15 mL Mouth Rinse BID  . clonazePAM  0.25 mg Oral BID  . folic acid  2 mg Intravenous Daily  . prenatal multivitamin  1 tablet Oral Daily  . sodium chloride flush  3 mL  Intravenous Q12H  . thiamine  100 mg Oral Daily   Or  . thiamine  100 mg Intravenous Daily   Continuous Infusions: . sodium chloride 100 mL/hr at 02/01/16 0941   PRN Meds: LORazepam, ondansetron **OR** ondansetron (ZOFRAN) IV, senna-docusate, sorbitol, witch hazel-glycerin  Time spent: 30 minutes  Author: Berle Mull, MD [facility] Pager: [phone] 02/01/2016 3:09 PM  If 7PM-7AM, please contact night-coverage at [email or web address], password [facility]

## 2016-02-01 NOTE — Care Management Note (Signed)
Case Management Note  Patient Details  Name: Kristine Macias MRN: [id number] Date of Birth: 11/20/1985  Subjective/Objective:  Per CSW residential rehab has not returned a response as of yet. D/c summary in, & update from Dr. Posey Pronto in.[facility] can provide HHRN if ordered. Patient has already received HINN 12 from yesterday.Patient has a relationship with this home healthcare agency-a safe d/c plan.  Notified attending if willing to d/c home w/HHC.                  Action/Plan:d/c plan home.   Expected Discharge Date:   (UNKNOWN)               Expected Discharge Plan:  [place]  In-House Referral:  Clinical Social Work  Discharge planning Services  CM Consult  Post Acute Care Choice:  Home Health (Active w/[facility]) Choice offered to:     DME Arranged:    DME Agency:     HH Arranged:    [place] Agency:     Status of Service:  In process, will continue to follow  Medicare Important Message Given:    Date Medicare IM Given:    Medicare IM give by:    Date Additional Medicare IM Given:    Additional Medicare Important Message give by:     If discussed at [place] of Stay Meetings, dates discussed:    Additional Comments:  Dessa Phi, RN 02/01/2016, 2:53 PM

## 2016-02-01 NOTE — Care Management Note (Signed)
Case Management Note  Patient Details  Name: Kristine Macias MRN: [id number] Date of Birth: 1985-09-03  Subjective/Objective: Just notified that [facility] liason [facility]-active w/HHRN.The home RN has a good relationship with patient & can talk to her if we need her to.I have passed this info on to [place] who is following for either residential rehab or home w/otpt f/u, since home is safe.Await d/c order.                  Action/Plan:d/c plan home/residential rehab.   Expected Discharge Date:   (UNKNOWN)               Expected Discharge Plan:  IP Rehab Facility  In-House Referral:  Clinical Social Work  Discharge planning Services  CM Consult  Post Acute Care Choice:  Home Health (Active w/[facility]) Choice offered to:     DME Arranged:    DME Agency:     HH Arranged:    [place] Agency:     Status of Service:  In process, will continue to follow  Medicare Important Message Given:    Date Medicare IM Given:    Medicare IM give by:    Date Additional Medicare IM Given:    Additional Medicare Important Message give by:     If discussed at [place] of Stay Meetings, dates discussed:    Additional Comments:  Dessa Phi, RN 02/01/2016, 11:30 AM

## 2016-02-02 DIAGNOSIS — R1084 Generalized abdominal pain: Secondary | ICD-10-CM

## 2016-02-02 DIAGNOSIS — F1020 Alcohol dependence, uncomplicated: Secondary | ICD-10-CM

## 2016-02-02 DIAGNOSIS — F1010 Alcohol abuse, uncomplicated: Secondary | ICD-10-CM

## 2016-02-02 LAB — FOLATE: Folate: 15.1 ng/mL (ref >5.9)

## 2016-02-02 LAB — GLUCOSE, CAPILLARY: Glucose-Capillary: 119 mg/dL — ABNORMAL HIGH (ref 65–99)

## 2016-02-02 MED ORDER — CLONAZEPAM 0.25 MG PO TBDP: 0.5000 mg | ORAL_TABLET | Freq: Two times a day (BID) | ORAL | Status: DC

## 2016-02-02 MED ORDER — FOLIC ACID 1 MG PO TABS
1.0000 mg | ORAL_TABLET | Freq: Every day | ORAL | Status: DC
  Administered 2016-02-02: 1 mg via ORAL
  Filled 2016-02-02: qty 1

## 2016-02-02 MED ORDER — FOLIC ACID 1 MG PO TABS: 1.0000 mg | ORAL_TABLET | Freq: Every day | ORAL | Status: DC

## 2016-02-02 NOTE — Progress Notes (Signed)
Patient discharged home with family, discharge instructions/prescritions given and explained to patient and she verbalized understanding, wants to make her own follow-up appointment. patient denies any pain/distress, waiting on her transportation.

## 2016-02-02 NOTE — Care Management Note (Signed)
Case Management Note  Patient Details  Name: Kristine Macias MRN: [id number] Date of Birth: Mar 27, 1986  Subjective/Objective:  [facility] rep [facility] aware of d/c home today & [place] social worker orders. No further d/c needs.                  Action/Plan:d/c home w/HHC.   Expected Discharge Date:   (UNKNOWN)               Expected Discharge Plan:  [place]  In-House Referral:  Clinical Social Work  Discharge planning Services  CM Consult  Post Acute Care Choice:  Home Health (Active w/[facility]) Choice offered to:     DME Arranged:    DME Agency:     HH Arranged:  RN, Social Work [employer] Agency:  [place]  Status of Service:  Completed, signed off  Medicare Important Message Given:    Date Medicare IM Given:    Medicare IM give by:    Date Additional Medicare IM Given:    Additional Medicare Important Message give by:     If discussed at [place] of Stay Meetings, dates discussed:    Additional Comments:  Dessa Phi, RN 02/02/2016, 10:50 AM

## 2016-02-02 NOTE — Discharge Summary (Addendum)
[facility] Discharge Summary   Patient: Kristine Macias [id number]:[id number]   PCP: Leonides Sake, MD DOB: [birth year]   Date of admission: 01/25/2016   Date of discharge:  02/02/2016    Discharge Diagnoses:  Principal Problem:   Alcohol withdrawal seizure ([place]) Active Problems:   Generalized anxiety disorder   Protein-calorie malnutrition, severe ([place])   Intractable nausea and vomiting   Hypokalemia   Alcohol intoxication ([place])   Thrombocytopenia ([place])   Alcoholic hepatitis without ascites   Alcohol abuse   Tobacco abuse   Pregnant   Anemia  Admitted From: home Disposition:  Home With home health RN and home health social worker to assist in arranging inpatient rehabilitation for alcohol abuse  Recommendations for Outpatient Follow-up:  1. Please follow-up with PCP in one week, follow-up with OB/GYN in 1 week with LFTs, establish care at [facility] perinatal for high risk pregnancies. 2. Please remain compliant with medical regimen. 3. Please refrain from any alcohol use while you're pregnant.    Follow-up Information    Schedule an appointment as soon as possible for a visit with Leonides Sake, MD.   Specialty:  Family Medicine   Contact information:   [place] [address] [phone]       Follow up with Rubie Maid, MD. Schedule an appointment as soon as possible for a visit in 1 week.   Specialties:  Obstetrics and Gynecology, Radiology   Why:  with liver function test.   Contact information:   [place] [address] [phone]       Follow up with [facility]. Call in 1 week.   Specialty:  Perinatology   Why:  for appointment to establish care.    Contact information:   [place] [phone]      Follow up with [facility].   Specialty:  Home Health Services   Why:  University social worker   Contact information:     [place] [address] [phone]      Diet recommendation: Regular diet  Activity: The patient is advised to gradually reintroduce usual activities.  Discharge Condition: good CODE STATUS: Full  History of present illness: As per the H and P dictated on admission, "Kristine Macias is a 30 y.o. female [redacted] weeks pregnant, with medical history significant of depression and anxiety, alcoholic hepatitis, alcohol withdrawal seizures, thrombocytopenia, history of breast cancer age 16 status post lumpectomy, congenital deafness, ongoing alcohol abuse which is recurrent, who presented to the ED abdominal pain, nausea and emesis which have been ongoing for the past week. Patient endorses subjective fevers, chills, nausea, vomiting, some shortness of breath, loose stools. Patient states last drink was the night prior to admission. Patient endorses drinking 4-5, 24 ounces of beer daily. Patient denies any liquor intake. Patient denies any hematemesis, no melena, no hematochezia. Patient denies any productive cough, no constipation, no dysuria. Patient endorses generalized tremors. Patient stated has had seizures in the past from alcohol withdrawal when she tried to detox himself at home. Patient denies any visual changes, no headaches, no asymmetric weakness or numbness."  Hospital Course:  Summary of her active problems in the hospital is as following. 1. Alcohol withdrawal with seizure/ Alcohol use disorder, severe, dependence - withdrawal has resolved - extensive counseling done again regarding alcohol abuse while pregnant, literature about fetal alcohol syndrome given, spoken with husband in detail as well  to prevent her for gaining access to alcohol - DSS involved as she has 2 children at home - Patient contracts for safety and mentions that she would not be using any alcohol further and would continue to work on getting inpatient rehabilitation facility admission, social  worker consulted to assist for the same as an outpatient.  2. Generalized anxiety disorder - resumed Clonazepam at 0.5 BID. As per the earlier provider this was the dose that his health and presents anxiety control.  3. Acute alcoholic hepatitis - LFTs improved based on last LFT 01/27/2016.  4. Protein-calorie malnutrition, severe  - As per RD no indication for nutrition supplementation. Continue folic acid and multivitamins  5. Intractable nausea and vomiting - due to pregnancy - Zofran  6. Hypokalemia - replaced  7. Pregnant - most recent ultrasound a couple of wks ago shows normal size of fetus - partial ultrasound on 5/31 reveals viable fetus, closed cervix - also noted low folic acid levels putting baby at risk for neural tube defects- replacing with high dose IV Folic acid - had some abdominal cramping - Fetal Heart rate was ok - Patient need to establish care at [facility] prenatal clinic for high-risk pregnancy. Referral has already been sent out as an outpatient. Recommend patient to continue prenatal care as recommended by OB/GYN.  8. Anemia - multivitamins - low normal B12 (246)  - Folate level improved from 2.1-15.1 - given B12 subcu and 15m IV folic acid daily - will transition to oral on discharge -cont prenatal vitamins as well  9. Nicotine abuse - advised to stop smoking  10. Dizziness and lightheadedness. Blood pressure running in the low normal, systolic in 947W Negative orthostatic. Encourage by mouth fluids  All other chronic medical condition were stable during the hospitalization.  Patient was ambulatory without any assistance. Home health RN as well as a sEducation officer, museumwas arranged by case manager to help assist patient monitoring her progress clinically as well as arranging for inpatient rehabilitation facilities.  On the day of the discharge the patient's vitals were stable, and no other acute medical condition were reported by patient. the patient  was felt safe to be discharge at home with home health.  Procedures and Results:  None   Consultations:  Psychiatric  DISCHARGE MEDICATION: Current Discharge Medication List    START taking these medications   Details  folic acid (FOLVITE) 1 MG tablet Take 1 tablet (1 mg total) by mouth daily. Qty: 60 tablet, Refills: 0    senna-docusate (SENOKOT-S) 8.6-50 MG tablet Take 1 tablet by mouth at bedtime as needed for mild constipation.    thiamine 100 MG tablet Take 1 tablet (100 mg total) by mouth daily. Qty: 30 tablet, Refills: 0    vitamin B-12 (CYANOCOBALAMIN) 1000 MCG tablet Take 1 tablet (1,000 mcg total) by mouth daily. Qty: 30 tablet, Refills: 0      CONTINUE these medications which have CHANGED   Details  clonazePAM (KLONOPIN) 0.25 MG disintegrating tablet Take 2 tablets (0.5 mg total) by mouth 2 (two) times daily. Qty: 60 tablet, Refills: 0      CONTINUE these medications which have NOT CHANGED   Details  Prenatal Vit-Fe Fumarate-FA (PRENATAL MULTIVITAMIN) TABS tablet Take 1 tablet by mouth daily.    ondansetron (ZOFRAN ODT) 8 MG disintegrating tablet Take 1 tablet (8 mg total) by mouth every 8 (eight) hours as needed for nausea or vomiting. Qty: 12 tablet, Refills: 0    pantoprazole (PROTONIX) 40 MG tablet  Take 1 tablet (40 mg total) by mouth daily. Qty: 30 tablet, Refills: 3      STOP taking these medications     metoCLOPramide (REGLAN) 10 MG tablet      metroNIDAZOLE (METROGEL) 0.75 % vaginal gel      promethazine (PHENERGAN) 25 MG tablet      sucralfate (CARAFATE) 1 GM/10ML suspension        Allergies  Allergen Reactions  . Aspirin Shortness Of Breath  . Effexor [Venlafaxine] Anaphylaxis  . Sulfa Antibiotics Anaphylaxis  . Tetracyclines & Related Anaphylaxis  . Trazodone And Nefazodone Anaphylaxis  . Latex Hives and Itching  . Paxil [Paroxetine] Hives and Itching  . Seroquel [Quetiapine Fumarate] Hives and Itching  . Zoloft [Sertraline Hcl]  Hives and Itching   Discharge Instructions    Diet general    Complete by:  As directed      Discharge instructions    Complete by:  As directed   It is important that you read following instructions as well as go over your medication list with RN to help you understand your care after this hospitalization.  Discharge Instructions: Please follow-up with PCP in one week Please STOP alcohol use while you are pregnant. Please take your medication as scheduled.  Please request your primary care physician to go over all Hospital Tests and Procedure/Radiological results at the follow up,  Please get all Hospital records sent to your PCP by signing hospital release before you go home.   Do not drive, operating heavy machinery, perform activities at heights, swimming or participation in water activities or provide baby sitting services while your are on Pain, Sleep and Anxiety Medications; until you have been seen by Primary Care Physician or a Neurologist and advised to do so again. Do not take more than prescribed Pain, Sleep and Anxiety Medications. You were cared for by a hospitalist during your hospital stay. If you have any questions about your discharge medications or the care you received while you were in the hospital after you are discharged, you can call the unit and ask to speak with the hospitalist on call if the hospitalist that took care of you is not available.  Once you are discharged, your primary care physician will handle any further medical issues. Please note that NO REFILLS for any discharge medications will be authorized once you are discharged, as it is imperative that you return to your primary care physician (or establish a relationship with a primary care physician if you do not have one) for your aftercare needs so that they can reassess your need for medications and monitor your lab values. You Must read complete instructions/literature along with all the possible adverse  reactions/side effects for all the Medicines you take and that have been prescribed to you. Take any new Medicines after you have completely understood and accept all the possible adverse reactions/side effects. Wear Seat belts while driving. If you have smoked or chewed Tobacco in the last 2 yrs please stop smoking and/or stop any Recreational drug use.     Face-to-face encounter (required for Medicare/Medicaid patients)    Complete by:  As directed   I Anielle Headrick certify that this patient is under my care and that I, or a nurse practitioner or physician's assistant working with me, had a face-to-face encounter that meets the physician face-to-face encounter requirements with this patient on 02/02/2016. The encounter with the patient was in whole, or in part for the following medical condition(s) which is  the primary reason for home health care (List medical condition): recurrent alcohol withdrawal, alcohol abuse, [redacted] weeks pregnant, need in-patient alcohol withdrawal rehabilitation. Need to be referred to [facility] high risk unit for pregnancy.  The encounter with the patient was in whole, or in part, for the following medical condition, which is the primary reason for home health care:  yes  I certify that, based on my findings, the following services are medically necessary home health services:  Nursing  Reason for Medically Necessary Home Health Services:  Skilled Nursing- Skilled Assessment/Observation  My clinical findings support the need for the above services:  OTHER SEE COMMENTS  Further, I certify that my clinical findings support that this patient is homebound due to:  Unable to leave home safely without assistance     Home Health    Complete by:  As directed   To provide the following care/treatments:   RN Social work       Increase activity slowly    Complete by:  As directed           Discharge Exam: Filed Weights   01/31/16 0620 02/01/16 0625 02/02/16 0625  Weight: 50.803 kg  (112 lb) 52.118 kg (114 lb 14.4 oz) 51.846 kg (114 lb 4.8 oz)   Filed Vitals:   02/02/16 0915 02/02/16 1009  BP: 116/61   Pulse: 82   Temp:  98.3 F (36.8 C)  Resp: 17    General: Appear in no distress, no Rash; Oral Mucosa moist. Cardiovascular: S1 and S2 Present, nop Murmur, no JVD Respiratory: Bilateral Air entry present and Clear to Auscultation, no Crackles, no wheezes Abdomen: Bowel Sound present, Soft and no tenderness Extremities: no Pedal edema, no calf tenderness Neurology: Grossly no focal neuro deficit.no  The results of significant diagnostics from this hospitalization (including imaging, microbiology, ancillary and laboratory) are listed below for reference.    Significant Diagnostic Studies: US Ob Limited  01/25/2016  CLINICAL DATA:  Vomiting.  Alcohol use. EXAM: LIMITED OBSTETRIC ULTRASOUND COMPARISON:  12/30/2015 FINDINGS: Number of Fetuses:  1 Heart Rate:  163 bpm Movement:  Yes Presentation: Transverse Placental Location: Posterior Previa: No Amniotic Fluid (Subjective):  Within normal limits. BPD:  2.82cm 15w  0d MATERNAL FINDINGS: Cervix:  Appears closed. Uterus/Adnexae:  No abnormality visualized. IMPRESSION: Single living intrauterine gestation as above. This exam is performed on an emergent basis and does not comprehensively evaluate fetal size, dating, or anatomy; follow-up complete OB US should be considered if further fetal assessment is warranted. Electronically Signed   By: Logan Bores M.D.   On: 01/25/2016 13:55    Microbiology: Recent Results (from the past 240 hour(s))  Culture, Urine     Status: Abnormal   Collection Time: 01/25/16 10:09 AM  Result Value Ref Range Status   Specimen Description URINE, RANDOM  Final   Special Requests NONE  Final   Culture (A)  Final    1,000 COLONIES/mL INSIGNIFICANT GROWTH Performed at [facility]    Report Status 01/26/2016 FINAL  Final     Labs: CBC:  Recent Labs Lab 01/27/16 0437  WBC 10.3  HGB  8.7*  HCT 25.8*  MCV 95.6  PLT 924*   Basic Metabolic Panel:  Recent Labs Lab 01/27/16 0437  NA 134*  K 3.9  CL 105  CO2 22  GLUCOSE 85  BUN <5*  CREATININE <0.30*  CALCIUM 8.5*   Liver Function Tests:  Recent Labs Lab 01/27/16 0437  AST 84*  ALT 33  ALKPHOS 156*  BILITOT 0.9  PROT 6.5  ALBUMIN 2.8*   BNP (last 3 results)  Recent Labs  10/04/15 2021  BNP 128.0*   CBG:  Recent Labs Lab 01/28/16 0725 01/30/16 0735 01/31/16 0731 02/01/16 0738 02/02/16 0728  GLUCAP 92 81 84 85 119*   Time spent: 30 minutes  Signed:  Adalina Dopson  [facility]  02/02/2016  , 1:20 PM

## 2016-02-02 NOTE — Progress Notes (Signed)
Nutrition Follow-up  DOCUMENTATION CODES:   Severe malnutrition in context of chronic illness, Underweight  INTERVENTION:   Provide daily snacks in between meals. Encourage small frequent meals RD to continue to monitor  NUTRITION DIAGNOSIS:   Increased nutrient needs related to other (see comment) (pregnancy) as evidenced by estimated needs.  Ongoing.  GOAL:   Patient will meet greater than or equal to 90% of their needs  Progressing.  MONITOR:   PO intake, Labs, Weight trends, I & O's  REASON FOR ASSESSMENT:   Other (Comment) (Low BMI)    ASSESSMENT:   30 y.o. female [redacted] weeks pregnant, with medical history significant of depression and anxiety, alcoholic hepatitis, alcohol withdrawal seizures, thrombocytopenia, history of breast cancer age 71 status post lumpectomy, congenital deafness, ongoing alcohol abuse which is recurrent, who presented to the ED abdominal pain, nausea and emesis which have been ongoing for the past week. Patient endorses subjective fevers, chills, nausea, vomiting, some shortness of breath, loose stools. Patient states last drink was the night prior to admission. Patient endorses drinking 4-5, 24 ounces of beer daily.   Patient reports fair appetite. Pt states she is nauseous and not able to eat much at one time. Encouraged small frequent meals and snacks. Pt with questions regarding foods rich in B-12, folate and potassium. Discussed these foods with patient. Pt with chips and sub at bedside. PO intake documented: 80%.  Pt expected to discharge to rehab today.  Medications: Folic acid tablet daily, Prenatal multivitamin tablet daily, Thiamine tablet daily, Zofran PRN Labs reviewed: Low Na   Diet Order:  Diet regular Room service appropriate?: Yes; Fluid consistency:: Thin Diet general  Skin:  Reviewed, no issues  Last BM:  6/6  Height:   Ht Readings from Last 1 Encounters:  01/25/16 5' 6"  (1.676 m)    Weight:   Wt Readings from Last  1 Encounters:  02/02/16 114 lb 4.8 oz (51.846 kg)    Ideal Body Weight:  59.1 kg  BMI:  Body mass index is 18.46 kg/(m^2).  Estimated Nutritional Needs:   Kcal:  1600-1800  Protein:  65-75g  Fluid:  1.8L/day  EDUCATION NEEDS:   Education needs addressed  Clayton Bibles, MS, RD, LDN Pager: [phone] After Hours Pager: [phone]

## 2016-02-02 NOTE — Care Management Note (Signed)
Case Management Note  Patient Details  Name: Kristine Macias MRN: [id number] Date of Birth: May 25, 1986  Subjective/Objective:  CM has signed off-all resources, & processes has been provided. Nsg/MD notified.Recc-HHRN.                  Action/Plan:d/c home.   Expected Discharge Date:   (UNKNOWN)               Expected Discharge Plan:  [place]  In-House Referral:  Clinical Social Work  Discharge planning Services  CM Consult  Post Acute Care Choice:  Home Health (Active w/[facility]) Choice offered to:     DME Arranged:    DME Agency:     HH Arranged:    [place]:     Status of Service:  Completed, signed off  Medicare Important Message Given:    Date Medicare IM Given:    Medicare IM give by:    Date Additional Medicare IM Given:    Additional Medicare Important Message give by:     If discussed at [place] of Stay Meetings, dates discussed:    Additional Comments:  Dessa Phi, RN 02/02/2016, 10:17 AM

## 2016-02-02 NOTE — Progress Notes (Signed)
Patient transported home by boyfriend, patient denies any pain/distress.

## 2016-02-15 ENCOUNTER — Ambulatory Visit (INDEPENDENT_AMBULATORY_CARE_PROVIDER_SITE_OTHER): Payer: Medicaid Other | Admitting: Obstetrics and Gynecology

## 2016-02-15 ENCOUNTER — Encounter: Payer: Self-pay | Admitting: Obstetrics and Gynecology

## 2016-02-15 ENCOUNTER — Other Ambulatory Visit: Payer: Self-pay | Admitting: Obstetrics and Gynecology

## 2016-02-15 VITALS — BP 97/60 | HR 102 | Wt 113.0 lb

## 2016-02-15 DIAGNOSIS — O0992 Supervision of high risk pregnancy, unspecified, second trimester: Secondary | ICD-10-CM

## 2016-02-15 DIAGNOSIS — K0889 Other specified disorders of teeth and supporting structures: Secondary | ICD-10-CM

## 2016-02-15 LAB — POCT URINALYSIS DIPSTICK
BILIRUBIN UA: NEGATIVE
Glucose, UA: NEGATIVE
KETONES UA: 40
NITRITE UA: NEGATIVE
PH UA: 6
Protein, UA: NEGATIVE
RBC UA: NEGATIVE
SPEC GRAV UA: 1.025
Urobilinogen, UA: 0.2

## 2016-02-16 LAB — URINALYSIS, ROUTINE W REFLEX MICROSCOPIC
BILIRUBIN UA: NEGATIVE
GLUCOSE, UA: NEGATIVE
LEUKOCYTES UA: NEGATIVE
Nitrite, UA: NEGATIVE
RBC UA: NEGATIVE
Urobilinogen, Ur: 0.2 mg/dL (ref 0.2–1.0)
pH, UA: 6 (ref 5.0–7.5)

## 2016-02-16 LAB — MICROSCOPIC EXAMINATION: Casts: NONE SEEN /lpf

## 2016-02-17 LAB — GC/CHLAMYDIA PROBE AMP
Chlamydia trachomatis, NAA: NEGATIVE
NEISSERIA GONORRHOEAE BY PCR: NEGATIVE

## 2016-02-17 LAB — CBC WITH DIFFERENTIAL/PLATELET
BASOS ABS: 0 10*3/uL (ref 0.0–0.2)
Basos: 0 %
EOS (ABSOLUTE): 0.1 10*3/uL (ref 0.0–0.4)
Eos: 1 %
Hematocrit: 30.9 % — ABNORMAL LOW (ref 34.0–46.6)
Hemoglobin: 10 g/dL — ABNORMAL LOW (ref 11.1–15.9)
Immature Grans (Abs): 0 10*3/uL (ref 0.0–0.1)
Immature Granulocytes: 0 %
LYMPHS ABS: 1.5 10*3/uL (ref 0.7–3.1)
Lymphs: 12 %
MCH: 32.1 pg (ref 26.6–33.0)
MCHC: 32.4 g/dL (ref 31.5–35.7)
MCV: 99 fL — ABNORMAL HIGH (ref 79–97)
MONOCYTES: 4 %
MONOS ABS: 0.6 10*3/uL (ref 0.1–0.9)
Neutrophils Absolute: 11 10*3/uL — ABNORMAL HIGH (ref 1.4–7.0)
Neutrophils: 83 %
Platelets: 417 10*3/uL — ABNORMAL HIGH (ref 150–379)
RBC: 3.12 x10E6/uL — AB (ref 3.77–5.28)
RDW: 17.9 % — AB (ref 12.3–15.4)
WBC: 13.3 10*3/uL — AB (ref 3.4–10.8)

## 2016-02-17 LAB — ANTIBODY SCREEN: ANTIBODY SCREEN: NEGATIVE

## 2016-02-17 LAB — CULTURE, OB URINE

## 2016-02-17 LAB — PAIN MGT SCRN (14 DRUGS), UR
AMPHETAMINE SCRN UR: NEGATIVE ng/mL
BARBITURATE SCRN UR: NEGATIVE ng/mL
BUPRENORPHINE, URINE: NEGATIVE ng/mL
Benzodiazepine Screen, Urine: POSITIVE ng/mL
Cannabinoids Ur Ql Scn: NEGATIVE ng/mL
Cocaine(Metab.)Screen, Urine: NEGATIVE ng/mL
Creatinine(Crt), U: 158.9 mg/dL (ref 20.0–300.0)
Fentanyl, Urine: NEGATIVE pg/mL
METHADONE SCREEN, URINE: NEGATIVE ng/mL
Meperidine Screen, Urine: NEGATIVE ng/mL
Opiate Scrn, Ur: NEGATIVE ng/mL
Oxycodone+Oxymorphone Ur Ql Scn: NEGATIVE ng/mL
PCP SCRN UR: NEGATIVE ng/mL
PH UR, DRUG SCRN: 5.7 (ref 4.5–8.9)
PROPOXYPHENE SCREEN: NEGATIVE ng/mL
TRAMADOL UR QL SCN: NEGATIVE ng/mL

## 2016-02-17 LAB — ABO/RH: RH TYPE: POSITIVE

## 2016-02-17 LAB — HEPATITIS B SURFACE ANTIGEN: HEP B S AG: NEGATIVE

## 2016-02-17 LAB — RPR QUALITATIVE: RPR Ser Ql: NONREACTIVE

## 2016-02-17 LAB — HIV ANTIBODY (ROUTINE TESTING W REFLEX): HIV Screen 4th Generation wRfx: NONREACTIVE

## 2016-02-17 LAB — RUBELLA SCREEN: Rubella Antibodies, IGG: 5.31 index (ref >0.99)

## 2016-02-17 LAB — NICOTINE SCREEN, URINE: Cotinine Ql Scrn, Ur: POSITIVE ng/mL

## 2016-02-17 LAB — VARICELLA ZOSTER ANTIBODY, IGM: Varicella IgM: <0.91 index (ref 0.00–0.90)

## 2016-02-17 LAB — URINE CULTURE, OB REFLEX: Organism ID, Bacteria: NO GROWTH

## 2016-02-19 NOTE — Progress Notes (Signed)
ROB: Patient reports tooth pain (right sided) x 2 days, notes difficulty eating. Referred to dentist. Missed [facility] Perinatal appt due to recent hospitalization for alcohol intoxication and withdrawal seizure. Will need to reschedule.  Discussed that patient will likely need high risk care at [facility]. RTC in 4 weeks for OB visit, and 2 weeks for anatomy scan. NOB labs drawn today.

## 2016-02-27 ENCOUNTER — Ambulatory Visit: Payer: Self-pay

## 2016-03-01 ENCOUNTER — Ambulatory Visit
Admission: RE | Admit: 2016-03-01 | Discharge: 2016-03-01 | Disposition: A | Discharge status: Home / Self Care | Payer: Medicaid Other | Source: Ambulatory Visit | Attending: Obstetrics and Gynecology | Admitting: Obstetrics and Gynecology

## 2016-03-01 ENCOUNTER — Ambulatory Visit (INDEPENDENT_AMBULATORY_CARE_PROVIDER_SITE_OTHER): Payer: Medicaid Other

## 2016-03-01 VITALS — BP 105/61 | HR 103 | Temp 98.5°F | Resp 18 | Ht 66.0 in | Wt 115.0 lb

## 2016-03-01 DIAGNOSIS — K2980 Duodenitis without bleeding: Secondary | ICD-10-CM

## 2016-03-01 DIAGNOSIS — Z331 Pregnant state, incidental: Secondary | ICD-10-CM

## 2016-03-01 DIAGNOSIS — Z720 Tobacco use: Secondary | ICD-10-CM | POA: Diagnosis not present

## 2016-03-01 DIAGNOSIS — K759 Inflammatory liver disease, unspecified: Secondary | ICD-10-CM

## 2016-03-01 DIAGNOSIS — R636 Underweight: Secondary | ICD-10-CM

## 2016-03-01 DIAGNOSIS — K766 Portal hypertension: Secondary | ICD-10-CM

## 2016-03-01 DIAGNOSIS — I81 Portal vein thrombosis: Secondary | ICD-10-CM

## 2016-03-01 DIAGNOSIS — D529 Folate deficiency anemia, unspecified: Secondary | ICD-10-CM

## 2016-03-01 DIAGNOSIS — F411 Generalized anxiety disorder: Secondary | ICD-10-CM

## 2016-03-01 DIAGNOSIS — F1010 Alcohol abuse, uncomplicated: Secondary | ICD-10-CM

## 2016-03-01 DIAGNOSIS — K7010 Alcoholic hepatitis without ascites: Secondary | ICD-10-CM | POA: Diagnosis not present

## 2016-03-01 DIAGNOSIS — O0992 Supervision of high risk pregnancy, unspecified, second trimester: Secondary | ICD-10-CM | POA: Diagnosis not present

## 2016-03-01 DIAGNOSIS — Z3490 Encounter for supervision of normal pregnancy, unspecified, unspecified trimester: Secondary | ICD-10-CM

## 2016-03-01 DIAGNOSIS — D696 Thrombocytopenia, unspecified: Secondary | ICD-10-CM

## 2016-03-01 LAB — PROTIME-INR
INR: 1.08
PROTHROMBIN TIME: 14.2 s (ref 11.4–15.0)

## 2016-03-01 LAB — APTT: aPTT: 28 seconds (ref 24–36)
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
## 2016-03-05 ENCOUNTER — Telehealth: Payer: Self-pay

## 2016-03-07 ENCOUNTER — Telehealth: Payer: Self-pay

## 2016-03-07 NOTE — Telephone Encounter (Signed)
Patient notified of appointment with Dr. Enis Gash today 3:00pm.  Patient states she thinks that she can make the appointment, directions given to patient along with the physical address and office phone number.  Patient instructed to call [facility] should she be unable to find a ride to her appointment.

## 2016-03-13 ENCOUNTER — Ambulatory Visit (INDEPENDENT_AMBULATORY_CARE_PROVIDER_SITE_OTHER): Payer: Medicaid Other | Admitting: Obstetrics and Gynecology

## 2016-03-13 VITALS — BP 98/64 | HR 90 | Wt 119.4 lb

## 2016-03-13 DIAGNOSIS — F411 Generalized anxiety disorder: Secondary | ICD-10-CM

## 2016-03-13 DIAGNOSIS — Z3490 Encounter for supervision of normal pregnancy, unspecified, unspecified trimester: Secondary | ICD-10-CM

## 2016-03-13 DIAGNOSIS — Z720 Tobacco use: Secondary | ICD-10-CM

## 2016-03-13 DIAGNOSIS — O0992 Supervision of high risk pregnancy, unspecified, second trimester: Secondary | ICD-10-CM

## 2016-03-13 DIAGNOSIS — K766 Portal hypertension: Secondary | ICD-10-CM

## 2016-03-13 DIAGNOSIS — Z331 Pregnant state, incidental: Secondary | ICD-10-CM

## 2016-03-13 DIAGNOSIS — F1010 Alcohol abuse, uncomplicated: Secondary | ICD-10-CM

## 2016-03-13 DIAGNOSIS — R636 Underweight: Secondary | ICD-10-CM

## 2016-03-13 DIAGNOSIS — I81 Portal vein thrombosis: Secondary | ICD-10-CM

## 2016-03-13 LAB — POCT URINALYSIS DIPSTICK
BILIRUBIN UA: NEGATIVE
Blood, UA: NEGATIVE
GLUCOSE UA: NEGATIVE
KETONES UA: NEGATIVE
LEUKOCYTES UA: NEGATIVE
NITRITE UA: NEGATIVE
PH UA: 6.5
Spec Grav, UA: 1.015
Urobilinogen, UA: NEGATIVE

## 2016-03-13 NOTE — Progress Notes (Signed)
ROB: Patient denies complaints.  Was seen by [facility] last week, recommendations noted, including recommendations for patient to deliver at tertiary care center and f/u with GI at [facility] regarding h/o portal venous HTN from hepatitis. Patient did not make appointment due to transportation issues.  Also notes that it would be difficult for her to transfer care to [facility] or [facility] due to transportation issues (she and FOB live in [place], [place], and share 1 car.  FOB has car most of the time for work). Will discuss with [facility] if some visits can be at [facility] in [place] and if some can be in [place] with [place] with routine [facility] provided by [facility]. Still stressed need for delivery at tertiary care center. S/p normal anatomy scan. Will need to notify Peds at birth due to continued use of benzos. To reschedule appointments with both [facility] and with GI within next 4 weeks.  Can then be seen at [facility] in 8 weeks. Will need 28 week labs at that time.

## 2016-03-18 ENCOUNTER — Inpatient Hospital Stay
Admission: EM | Admit: 2016-03-18 | Discharge: 2016-03-18 | Disposition: A | Discharge status: Home / Self Care | Payer: Medicaid Other | Attending: Emergency Medicine | Admitting: Emergency Medicine

## 2016-03-22 ENCOUNTER — Telehealth: Payer: Self-pay | Admitting: Obstetrics and Gynecology

## 2016-03-22 NOTE — Telephone Encounter (Signed)
Dr. Marcelline Mates says she HAS to see Kristine Macias. We are not able to care for her here, she is very high risk. Thanks

## 2016-03-22 NOTE — Telephone Encounter (Signed)
This pt called and said B/C of transportation she could not go to [facility] for prenatal care of to physical therapy. She wanted me to schedule her here in Aug for her next visit to talk to Dr Marcelline Mates.

## 2016-03-23 ENCOUNTER — Telehealth: Payer: Self-pay | Admitting: Obstetrics and Gynecology

## 2016-03-23 NOTE — Telephone Encounter (Signed)
Unfortunately it's not so simple.  Due to where she lives, and having limited transportation, I am trying to coordinate with [facility] here in [place] regarding joint care until she gets further along in her pregnancy.  Then she will need to transition to [place] in [place] completely.  She is still supposed to go for her initial consultations at [facility], and then follow back up with [place] in 8 weeks.

## 2016-03-23 NOTE — Telephone Encounter (Signed)
This is to clarify appt for Kristine Macias, when I talked to her on the phone she said she was to go to [facility] for her next visit and then back here to see you in 8 wks. I left her a msg and told her she was very high risk and needed to go yo [facility]. Just need to clarify if she is to come here at all.

## 2016-03-26 ENCOUNTER — Encounter: Payer: Self-pay | Admitting: Obstetrics and Gynecology

## 2016-03-26 DIAGNOSIS — O4442 Low lying placenta NOS or without hemorrhage, second trimester: Secondary | ICD-10-CM | POA: Insufficient documentation

## 2016-04-03 ENCOUNTER — Encounter: Payer: Self-pay | Admitting: Obstetrics and Gynecology

## 2016-04-03 DIAGNOSIS — Z8751 Personal history of pre-term labor: Secondary | ICD-10-CM | POA: Insufficient documentation

## 2016-04-03 DIAGNOSIS — D520 Dietary folate deficiency anemia: Secondary | ICD-10-CM | POA: Insufficient documentation

## 2016-04-03 DIAGNOSIS — K7030 Alcoholic cirrhosis of liver without ascites: Secondary | ICD-10-CM | POA: Insufficient documentation

## 2016-04-03 DIAGNOSIS — Z98891 History of uterine scar from previous surgery: Secondary | ICD-10-CM | POA: Insufficient documentation

## 2016-04-03 DIAGNOSIS — F17210 Nicotine dependence, cigarettes, uncomplicated: Secondary | ICD-10-CM | POA: Insufficient documentation

## 2016-05-03 ENCOUNTER — Encounter: Payer: Self-pay | Admitting: Obstetrics and Gynecology

## 2016-05-27 ENCOUNTER — Emergency Department (HOSPITAL_COMMUNITY)
Admission: EM | Admit: 2016-05-27 | Discharge: 2016-05-27 | Payer: Medicaid Other | Attending: Emergency Medicine | Admitting: Emergency Medicine

## 2016-05-27 ENCOUNTER — Encounter (HOSPITAL_COMMUNITY): Payer: Self-pay | Admitting: *Deleted

## 2016-05-27 DIAGNOSIS — I10 Essential (primary) hypertension: Secondary | ICD-10-CM | POA: Insufficient documentation

## 2016-05-27 DIAGNOSIS — O99333 Smoking (tobacco) complicating pregnancy, third trimester: Secondary | ICD-10-CM | POA: Insufficient documentation

## 2016-05-27 DIAGNOSIS — R636 Underweight: Secondary | ICD-10-CM

## 2016-05-27 DIAGNOSIS — Z720 Tobacco use: Secondary | ICD-10-CM

## 2016-05-27 DIAGNOSIS — O99323 Drug use complicating pregnancy, third trimester: Secondary | ICD-10-CM | POA: Diagnosis not present

## 2016-05-27 DIAGNOSIS — Z853 Personal history of malignant neoplasm of breast: Secondary | ICD-10-CM | POA: Insufficient documentation

## 2016-05-27 DIAGNOSIS — F10239 Alcohol dependence with withdrawal, unspecified: Secondary | ICD-10-CM | POA: Insufficient documentation

## 2016-05-27 DIAGNOSIS — F10939 Alcohol use, unspecified with withdrawal, unspecified: Secondary | ICD-10-CM

## 2016-05-27 DIAGNOSIS — F19229 Other psychoactive substance dependence with intoxication, unspecified: Secondary | ICD-10-CM | POA: Insufficient documentation

## 2016-05-27 DIAGNOSIS — Z91040 Latex allergy status: Secondary | ICD-10-CM | POA: Diagnosis not present

## 2016-05-27 DIAGNOSIS — F17210 Nicotine dependence, cigarettes, uncomplicated: Secondary | ICD-10-CM | POA: Diagnosis not present

## 2016-05-27 DIAGNOSIS — Z79899 Other long term (current) drug therapy: Secondary | ICD-10-CM | POA: Diagnosis not present

## 2016-05-27 DIAGNOSIS — Z3A32 32 weeks gestation of pregnancy: Secondary | ICD-10-CM | POA: Diagnosis not present

## 2016-05-27 LAB — URINALYSIS, ROUTINE W REFLEX MICROSCOPIC
BILIRUBIN URINE: NEGATIVE
Glucose, UA: NEGATIVE mg/dL
Hgb urine dipstick: NEGATIVE
Ketones, ur: NEGATIVE mg/dL
Leukocytes, UA: NEGATIVE
NITRITE: NEGATIVE
PH: 7 (ref 5.0–8.0)
Protein, ur: NEGATIVE mg/dL
SPECIFIC GRAVITY, URINE: 1.005 (ref 1.005–1.030)

## 2016-05-27 LAB — COMPREHENSIVE METABOLIC PANEL
ALBUMIN: 3 g/dL — AB (ref 3.5–5.0)
ALK PHOS: 108 U/L (ref 38–126)
ALT: 46 U/L (ref 14–54)
AST: 64 U/L — ABNORMAL HIGH (ref 15–41)
Anion gap: 10 (ref 5–15)
BUN: 6 mg/dL (ref 6–20)
CHLORIDE: 103 mmol/L (ref 101–111)
CO2: 22 mmol/L (ref 22–32)
Calcium: 7.4 mg/dL — ABNORMAL LOW (ref 8.9–10.3)
Creatinine, Ser: 0.36 mg/dL — ABNORMAL LOW (ref 0.44–1.00)
GFR calc Af Amer: >60 mL/min (ref >60)
GFR calc non Af Amer: >60 mL/min (ref >60)
Glucose, Bld: 97 mg/dL (ref 65–99)
POTASSIUM: 3.3 mmol/L — AB (ref 3.5–5.1)
SODIUM: 135 mmol/L (ref 135–145)
Total Bilirubin: 0.9 mg/dL (ref 0.3–1.2)
Total Protein: 6.7 g/dL (ref 6.5–8.1)

## 2016-05-27 LAB — CBC WITH DIFFERENTIAL/PLATELET
BASOS PCT: 0 %
Basophils Absolute: 0 10*3/uL (ref 0.0–0.1)
Eosinophils Absolute: 0.1 10*3/uL (ref 0.0–0.7)
Eosinophils Relative: 1 %
HEMATOCRIT: 33.2 % — AB (ref 36.0–46.0)
HEMOGLOBIN: 11 g/dL — AB (ref 12.0–15.0)
LYMPHS ABS: 1.5 10*3/uL (ref 0.7–4.0)
Lymphocytes Relative: 20 %
MCH: 29 pg (ref 26.0–34.0)
MCHC: 33.1 g/dL (ref 30.0–36.0)
MCV: 87.6 fL (ref 78.0–100.0)
MONOS PCT: 7 %
Monocytes Absolute: 0.6 10*3/uL (ref 0.1–1.0)
NEUTROS ABS: 5.6 10*3/uL (ref 1.7–7.7)
NEUTROS PCT: 72 %
Platelets: 254 10*3/uL (ref 150–400)
RBC: 3.79 MIL/uL — AB (ref 3.87–5.11)
RDW: 14 % (ref 11.5–15.5)
WBC: 7.7 10*3/uL (ref 4.0–10.5)

## 2016-05-27 LAB — RAPID URINE DRUG SCREEN, HOSP PERFORMED
AMPHETAMINES: NOT DETECTED
BENZODIAZEPINES: POSITIVE — AB
Barbiturates: NOT DETECTED
Cocaine: NOT DETECTED
OPIATES: NOT DETECTED
TETRAHYDROCANNABINOL: NOT DETECTED

## 2016-05-27 LAB — ETHANOL

## 2016-05-27 MED ORDER — LORAZEPAM 2 MG/ML IJ SOLN: 1.0000 mg | Freq: Once | INTRAMUSCULAR | Status: DC

## 2016-05-27 MED ORDER — MAGNESIUM SULFATE BOLUS VIA INFUSION
4.0000 g | Freq: Once | INTRAVENOUS | Status: AC
  Administered 2016-05-27: 4 g via INTRAVENOUS
  Filled 2016-05-27: qty 500

## 2016-05-27 MED ORDER — SODIUM CHLORIDE 0.9 % IV SOLN
INTRAVENOUS | Status: DC
  Administered 2016-05-27: 17:00:00 via INTRAVENOUS

## 2016-05-27 MED ORDER — NIFEDIPINE 10 MG PO CAPS
10.0000 mg | ORAL_CAPSULE | Freq: Once | ORAL | Status: AC
  Administered 2016-05-27: 10 mg via ORAL
  Filled 2016-05-27: qty 1

## 2016-05-27 MED ORDER — LACTATED RINGERS IV SOLN
INTRAVENOUS | Status: DC
  Administered 2016-05-27: 100 mL/h via INTRAVENOUS

## 2016-05-27 MED ORDER — SODIUM CHLORIDE 0.9 % IV BOLUS (SEPSIS)
500.0000 mL | Freq: Once | INTRAVENOUS | Status: AC
  Administered 2016-05-27: 500 mL via INTRAVENOUS

## 2016-05-27 MED ORDER — LORAZEPAM 2 MG/ML IJ SOLN
1.0000 mg | Freq: Once | INTRAMUSCULAR | Status: AC
  Administered 2016-05-27: 1 mg via INTRAVENOUS
  Filled 2016-05-27: qty 1

## 2016-05-27 MED ORDER — ONDANSETRON HCL 4 MG/2ML IJ SOLN: 4.0000 mg | Freq: Once | INTRAMUSCULAR | Status: DC

## 2016-05-27 MED ORDER — MAGNESIUM SULFATE 50 % IJ SOLN
2.0000 g/h | INTRAVENOUS | Status: DC
  Filled 2016-05-27: qty 80

## 2016-05-27 MED ORDER — MAGNESIUM SULFATE 50 % IJ SOLN: 4.0000 g | Freq: Once | INTRAMUSCULAR | Status: DC

## 2016-05-27 NOTE — Progress Notes (Signed)
Spoke with Dr. Gala Romney. Pt is having uterine activity that she describes as tightening. Orders received for Procardia 10 mg po once.

## 2016-05-27 NOTE — ED Notes (Signed)
Bed: RESA Expected date:  Expected time:  Means of arrival:  Comments:

## 2016-05-27 NOTE — Progress Notes (Signed)
Report given to [facility]
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
## 2016-05-27 NOTE — Progress Notes (Signed)
Dr. Rogene [place] in. Says he will review pt's labs and then talk with Dr. Gala Romney to form a plan of care.

## 2016-05-27 NOTE — Progress Notes (Signed)
Procardia 29m po

## 2016-05-27 NOTE — Progress Notes (Signed)
Report given to Rogue Jury, RN at [facility] labor and delivery.

## 2016-05-27 NOTE — Progress Notes (Signed)
Dr. Rogene [place] has spoken to receiving MD at [facility]. Pt is to be transferred. Orders received for Magnesium Sulfate 4gm load and then 2gm/hr.

## 2016-05-27 NOTE — ED Notes (Signed)
Report given to [facility] with [facility] before transfer

## 2016-05-27 NOTE — ED Notes (Signed)
Belongings sent with patient include t-shirt and pants worn into facility, purse, charger, wallet, and phone

## 2016-05-27 NOTE — ED Triage Notes (Signed)
Pt from home via EMS- Per EMS, pt is [redacted] week pregnant. Pt has hx of ETOH abuse. Pt reports that she had quit drinking and then started again 2 weeks ago. Pt sts that it takes approx 24 hrs before she starts to have DT's. Pt reports aura this am. Pt thinks she had a seizure earlier today but it was not witnessed. Pt c/o bilateral flank pain and lethargy. Pt hx of seizures is ETOH induced. Pt is A&O and in NAD. She received 500 cc bolus NS and 4 mg Zofran en route

## 2016-05-27 NOTE — Progress Notes (Signed)
Spoke with Dr. Gala Romney. Pt is a G5P2 at [redacted] weeks gestation with a hx of alcohol abuse. Pt quit drinking 2 weeks ago, but started back again. It has been 24hrs since her last drink. Pt reported having an aura today and thinks she had a seizure. The seizure was not witnessed. FHR is a category 1 tracing, no uc's. No vaginal bleeding or leaking of fluid. Pt's blood pressures somewhat elevated. Gets her care at [facility] because of her alcoholism. Says her baby is small. Has had one vaginal delivery and 1 C/S. Pt has had no seizure activity so far. Was shakey on admission, and received 27m of ativan and is resting quietly now. The following labs have been done: CMP, CBC with diff, U/A, Ethanol, and UDA. Pt's medical record number given to Dr. LGala Romney Says she will follow pt's labs. Protein and creatine ratio added.

## 2016-05-27 NOTE — ED Notes (Signed)
Report given to [facility] by OB Rapid Response

## 2016-05-27 NOTE — Progress Notes (Signed)
Pt is a G5P2 at [redacted] weeks gestation with a hx of ETOH abuse. Says she quit drinking and then started again 2 weeks ago. Says it has been 24 hrs since her last drink. Pt thinks she had a seizure earlier today, but it was not witnessed. Her oldest child is 31 years old, delivered vaginally, and her youngest is 30, delivered by C/S. She gets her [facility] at [facility]. She plans on delivering there. She is high risk because of her alcoholism. Denies abd pain, vaginal bleeding or leaking of fluid. She denies any other problems with this pregnancy except she says her baby is small.. She is not sure if she will be delivered by C/S or if she will have a vaginal delivery. No seizure activity at this time.

## 2016-05-28 LAB — PROTEIN / CREATININE RATIO, URINE
CREATININE, URINE: 27.04 mg/dL
PROTEIN CREATININE RATIO: 0.26 mg/mg{creat} — AB (ref 0.00–0.15)
TOTAL PROTEIN, URINE: 7 mg/dL

## 2016-05-30 DIAGNOSIS — E538 Deficiency of other specified B group vitamins: Secondary | ICD-10-CM | POA: Insufficient documentation

## 2016-08-03 ENCOUNTER — Encounter (HOSPITAL_COMMUNITY): Payer: Self-pay | Admitting: Emergency Medicine

## 2016-08-03 ENCOUNTER — Inpatient Hospital Stay (HOSPITAL_COMMUNITY)
Admission: EM | Admit: 2016-08-03 | Discharge: 2016-08-06 | DRG: 897 | Disposition: A | Discharge status: Home / Self Care | Payer: Medicaid Other | Attending: Family Medicine | Admitting: Family Medicine

## 2016-08-03 DIAGNOSIS — N939 Abnormal uterine and vaginal bleeding, unspecified: Secondary | ICD-10-CM

## 2016-08-03 DIAGNOSIS — Z79899 Other long term (current) drug therapy: Secondary | ICD-10-CM

## 2016-08-03 DIAGNOSIS — E860 Dehydration: Secondary | ICD-10-CM | POA: Diagnosis present

## 2016-08-03 DIAGNOSIS — F419 Anxiety disorder, unspecified: Secondary | ICD-10-CM | POA: Diagnosis present

## 2016-08-03 DIAGNOSIS — Z720 Tobacco use: Secondary | ICD-10-CM | POA: Diagnosis not present

## 2016-08-03 DIAGNOSIS — F10239 Alcohol dependence with withdrawal, unspecified: Secondary | ICD-10-CM | POA: Diagnosis not present

## 2016-08-03 DIAGNOSIS — Z881 Allergy status to other antibiotic agents status: Secondary | ICD-10-CM

## 2016-08-03 DIAGNOSIS — Z803 Family history of malignant neoplasm of breast: Secondary | ICD-10-CM

## 2016-08-03 DIAGNOSIS — F1010 Alcohol abuse, uncomplicated: Secondary | ICD-10-CM | POA: Diagnosis present

## 2016-08-03 DIAGNOSIS — E876 Hypokalemia: Secondary | ICD-10-CM | POA: Diagnosis not present

## 2016-08-03 DIAGNOSIS — K760 Fatty (change of) liver, not elsewhere classified: Secondary | ICD-10-CM | POA: Diagnosis present

## 2016-08-03 DIAGNOSIS — H9042 Sensorineural hearing loss, unilateral, left ear, with unrestricted hearing on the contralateral side: Secondary | ICD-10-CM | POA: Diagnosis present

## 2016-08-03 DIAGNOSIS — F10939 Alcohol use, unspecified with withdrawal, unspecified: Secondary | ICD-10-CM

## 2016-08-03 DIAGNOSIS — Z888 Allergy status to other drugs, medicaments and biological substances status: Secondary | ICD-10-CM

## 2016-08-03 DIAGNOSIS — F17210 Nicotine dependence, cigarettes, uncomplicated: Secondary | ICD-10-CM | POA: Diagnosis present

## 2016-08-03 DIAGNOSIS — Z882 Allergy status to sulfonamides status: Secondary | ICD-10-CM

## 2016-08-03 DIAGNOSIS — Z853 Personal history of malignant neoplasm of breast: Secondary | ICD-10-CM

## 2016-08-03 DIAGNOSIS — Z823 Family history of stroke: Secondary | ICD-10-CM

## 2016-08-03 DIAGNOSIS — F329 Major depressive disorder, single episode, unspecified: Secondary | ICD-10-CM | POA: Diagnosis present

## 2016-08-03 DIAGNOSIS — F411 Generalized anxiety disorder: Secondary | ICD-10-CM

## 2016-08-03 DIAGNOSIS — Y905 Blood alcohol level of 100-119 mg/100 ml: Secondary | ICD-10-CM | POA: Diagnosis present

## 2016-08-03 DIAGNOSIS — Z91040 Latex allergy status: Secondary | ICD-10-CM

## 2016-08-03 DIAGNOSIS — Z886 Allergy status to analgesic agent status: Secondary | ICD-10-CM

## 2016-08-03 DIAGNOSIS — Z801 Family history of malignant neoplasm of trachea, bronchus and lung: Secondary | ICD-10-CM

## 2016-08-03 LAB — CBC
HEMATOCRIT: 45.8 % (ref 36.0–46.0)
HEMOGLOBIN: 15.5 g/dL — AB (ref 12.0–15.0)
MCH: 29.5 pg (ref 26.0–34.0)
MCHC: 33.8 g/dL (ref 30.0–36.0)
MCV: 87.2 fL (ref 78.0–100.0)
Platelets: 323 10*3/uL (ref 150–400)
RBC: 5.25 MIL/uL — AB (ref 3.87–5.11)
RDW: 15.9 % — ABNORMAL HIGH (ref 11.5–15.5)
WBC: 9.1 10*3/uL (ref 4.0–10.5)

## 2016-08-03 LAB — RAPID URINE DRUG SCREEN, HOSP PERFORMED
AMPHETAMINES: NOT DETECTED
BARBITURATES: NOT DETECTED
BENZODIAZEPINES: POSITIVE — AB
COCAINE: NOT DETECTED
OPIATES: NOT DETECTED
TETRAHYDROCANNABINOL: NOT DETECTED

## 2016-08-03 LAB — COMPREHENSIVE METABOLIC PANEL
ALT: 39 U/L (ref 14–54)
ANION GAP: 14 (ref 5–15)
AST: 55 U/L — AB (ref 15–41)
Albumin: 4.2 g/dL (ref 3.5–5.0)
Alkaline Phosphatase: 154 U/L — ABNORMAL HIGH (ref 38–126)
BILIRUBIN TOTAL: 0.7 mg/dL (ref 0.3–1.2)
BUN: 23 mg/dL — ABNORMAL HIGH (ref 6–20)
CHLORIDE: 94 mmol/L — AB (ref 101–111)
CO2: 28 mmol/L (ref 22–32)
Calcium: 8.6 mg/dL — ABNORMAL LOW (ref 8.9–10.3)
Creatinine, Ser: 0.64 mg/dL (ref 0.44–1.00)
GFR calc Af Amer: >60 mL/min (ref >60)
Glucose, Bld: 116 mg/dL — ABNORMAL HIGH (ref 65–99)
POTASSIUM: 3.3 mmol/L — AB (ref 3.5–5.1)
Sodium: 136 mmol/L (ref 135–145)
TOTAL PROTEIN: 8.6 g/dL — AB (ref 6.5–8.1)

## 2016-08-03 LAB — ETHANOL: ALCOHOL ETHYL (B): 100 mg/dL — AB (ref <5)

## 2016-08-03 MED ORDER — ONDANSETRON HCL 4 MG/2ML IJ SOLN
4.0000 mg | Freq: Once | INTRAMUSCULAR | Status: AC
  Administered 2016-08-03: 4 mg via INTRAVENOUS
  Filled 2016-08-03: qty 2

## 2016-08-03 MED ORDER — SODIUM CHLORIDE 0.9 % IV BOLUS (SEPSIS)
1000.0000 mL | Freq: Once | INTRAVENOUS | Status: AC
  Administered 2016-08-03: 1000 mL via INTRAVENOUS

## 2016-08-03 MED ORDER — LORAZEPAM 2 MG/ML IJ SOLN
1.0000 mg | Freq: Once | INTRAMUSCULAR | Status: AC
  Administered 2016-08-03: 1 mg via INTRAVENOUS
  Filled 2016-08-03: qty 1

## 2016-08-03 MED ORDER — M.V.I. ADULT IV INJ
INJECTION | Freq: Once | INTRAVENOUS | Status: AC
  Administered 2016-08-04: via INTRAVENOUS
  Filled 2016-08-03: qty 1000

## 2016-08-03 NOTE — ED Triage Notes (Signed)
Pt BIB EMS. Pt states she has had withdrawal symptoms x 2 days from ETOH. She has no other complaints.

## 2016-08-03 NOTE — ED Triage Notes (Signed)
Patient reports drinking "3 or 4" beers a day. States last drink was last night. Patient c/o headache and N/V.
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
## 2016-08-03 NOTE — H&P (Addendum)
History and Physical    Kristine Macias NIO:[id number] DOB: 1986-04-20 DOA: 08/03/2016  Referring MD/NP/PA: Antonietta Breach, PA-C PCP: Leonides Sake, MD  Patient coming from: Home  Chief Complaint:   HPI: Kristine Macias is a 30 y.o. female with medical history significant of alcohol abuse, alcohol withdrawal seizures, alcoholic hepatitis, status post C-section 1 month ago, and depression/anxiety; who presents with complaints of alcohol withdrawal and wanting to detox. Patient provides her own history, but parts of the history are contradicted once patient's significant other arrives. Patient notes that she initially had bleeding after her C-section that seemed to resolve. Thereafter, she reports started back drinking on average three 24 ounce cans of beer per day. Within a day or so of restarting drinking patient complained of vaginal bleeding. Patient reports constant bleeding requiring her to change her pads every hour or so for the last week or more. Associated symptoms with this include generalized weakness, decreased appetite, diarrhea, nausea, vomiting, palpitations, anxiety, and reported seizure. Patient gives history that the husband witnessed the seizure prior to arrival today. However, husband, as in and noticed that he did not witness her having a seizure and that he was taking care of the kids.  ED Course: Upon admission into emergency department patient seen to be afebrile, but tachycardic with heart rates up to 113. Lab work revealed hemoglobin 15.5, potassium 3.3, BUN 23, creatinine 0.64.  Review of Systems: As per HPI otherwise 10 point review of systems negative.   Past Medical History:  Diagnosis Date  . Alcohol abuse 11/2014   drinking since age 3. chronic, recurrent. at least 2 detox admits before 2015.   Marland Kitchen Alcoholic hepatitis 03/1828   hepatic steatosis on 11/2014 ultrasound.   . Breast cancer [facility])    age 78, lump removed from left breast  . Chlamydia 2007   bacterial vaginosis 09/2011  . Colitis 12/2014   colonoscopy for diarrhea and abnml CT 01/06/15: erythmatous TI (path: active ileitis, ? emerging IBD), rectal erythema (path: mucosal prolapse). Random bx of normal colon (path unremarkable)   . Congenital deafness    left ear only.  Also noted in mother and sibling (unilateral).   . Depression with anxiety initally at age 35  . Failure to thrive in adult 12/2014.    Malnutrition: n/v, not eating, weight loss, BMI 14.  s/p 01/11/2015 PEG (Dr Dorna Leitz).   . Irregular heart beat 2010   wt/diet related after evaluation  . Pancreas divisum 02/2015   Type 1 seen on CT.   Marland Kitchen Pneumothorax, spontaneous, tension   . Renal disorder   . Seizure ([place]) 06/2015   due to ETOH/benzo withdrawal.   . Spontaneous pneumothorax 08/2012   right.  chest tube placed.   . Thrombocytopenia ([place]) 04/2007    Past Surgical History:  Procedure Laterality Date  . CESAREAN SECTION  07/2009    x 1.  G3, Para 2012.   . CHEST TUBE INSERTION    . COLONOSCOPY WITH PROPOFOL N/A 01/06/2015   [facility], Dr Rayann Heman. colonoscopy for diarrhea and abnml CT 01/06/15: erythmatous TI (path: active ileitis, ? emerging IBD), rectal erythema (path: mucosal prolapse). Random bx of normal colon (path unremarkable)   . ESOPHAGOGASTRODUODENOSCOPY N/A 01/06/2015   ;[facility], Dr Rayann Heman. For wt loss, N/V: Normal study, duodenal biopsy/pathology: chronic active duodenitis.   Marland Kitchen ESOPHAGOGASTRODUODENOSCOPY N/A 01/11/2015   Rein-normal with PEG placement  . PEG PLACEMENT N/A 01/11/2015   [facility], Dr Rayann Heman.  for N/V/wt loss/severe malnutrition.  reports that she has been smoking Cigarettes.  She has been smoking about 2.00 packs per day. She has never used smokeless tobacco. She reports that she drinks about 21.6 oz of alcohol per week . She reports that she does not use drugs.  Allergies  Allergen Reactions  . Aspirin Shortness Of Breath  . Effexor [Venlafaxine] Anaphylaxis  . Gabapentin Anaphylaxis  . Sulfa  Antibiotics Anaphylaxis  . Tetracyclines & Related Anaphylaxis  . Trazodone And Nefazodone Anaphylaxis  . Latex Hives and Itching  . Paxil [Paroxetine] Hives and Itching  . Seroquel [Quetiapine Fumarate] Hives and Itching  . Zoloft [Sertraline Hcl] Hives and Itching    Family History  Problem Relation Age of Onset  . Thyroid disease Mother   . Cancer Mother     breast cancer  . Cancer Father     lung cancer  . Stroke Other   . Crohn's disease Brother   . Cancer Maternal Grandmother     breast cancer  . Anesthesia problems Neg Hx     Prior to Admission medications   Medication Sig Start Date End Date Taking? Authorizing Provider  clonazePAM (KLONOPIN) 0.5 MG tablet Take 0.5 mg by mouth 2 (two) times daily. 03/15/16  Yes Historical Provider, MD  clonazePAM (KLONOPIN) 0.25 MG disintegrating tablet Take 2 tablets (0.5 mg total) by mouth 2 (two) times daily. Patient not taking: Reported on 08/03/2016 02/02/16   Lavina Hamman, MD  folic acid (FOLVITE) 1 MG tablet Take 1 tablet (1 mg total) by mouth daily. Patient not taking: Reported on 08/03/2016 02/02/16   Lavina Hamman, MD  thiamine 100 MG tablet Take 1 tablet (100 mg total) by mouth daily. Patient not taking: Reported on 08/03/2016 01/30/16   Debbe Odea, MD  vitamin B-12 (CYANOCOBALAMIN) 1000 MCG tablet Take 1 tablet (1,000 mcg total) by mouth daily. Patient not taking: Reported on 08/03/2016 01/30/16   Debbe Odea, MD    Physical Exam:   Constitutional: Young female who appears NAD, calm, comfortable Vitals:   08/03/16 1929 08/03/16 1938 08/03/16 2251  BP: 131/99 131/99 (!) 137/112  Pulse: 113 113 102  Resp: 16  16  Temp: 98.4 F (36.9 C)    TempSrc: Oral    SpO2: 93%  97%   Eyes: PERRL, lids and conjunctivae normal ENMT: Mucous membranes are moist. Posterior pharynx clear of any exudate or lesions.Normal dentition.  Neck: normal, supple, no masses, no thyromegaly Respiratory: clear to auscultation bilaterally, no  wheezing, no crackles. Normal respiratory effort. No accessory muscle use.  Cardiovascular: Tachycardic, no murmurs / rubs / gallops. No extremity edema. 2+ pedal pulses. No carotid bruits.  Abdomen: no tenderness, no masses palpated. No hepatosplenomegaly. Bowel sounds positive.  Musculoskeletal: no clubbing / cyanosis. No joint deformity upper and lower extremities. Good ROM, no contractures. Normal muscle tone.  Skin: Healing C-section wound with a small <2cm area of erythema noted on the far right side.  Neurologic: CN 2-12 grossly intact. Sensation intact, DTR normal. Strength 5/5 in all 4.  Psychiatric: Poor judgment and insight. Alert and oriented x 3. Anxious mood.     Labs on Admission: I have personally reviewed following labs and imaging studies  CBC:  Recent Labs Lab 08/03/16 2026  WBC 9.1  HGB 15.5*  HCT 45.8  MCV 87.2  PLT 654   Basic Metabolic Panel:  Recent Labs Lab 08/03/16 2026  NA 136  K 3.3*  CL 94*  CO2 28  GLUCOSE 116*  BUN 23*  CREATININE 0.64  CALCIUM 8.6*   GFR: CrCl cannot be calculated (Unknown ideal weight.). Liver Function Tests:  Recent Labs Lab 08/03/16 2026  AST 55*  ALT 39  ALKPHOS 154*  BILITOT 0.7  PROT 8.6*  ALBUMIN 4.2   No results for input(s): LIPASE, AMYLASE in the last 168 hours. No results for input(s): AMMONIA in the last 168 hours. Coagulation Profile: No results for input(s): INR, PROTIME in the last 168 hours. Cardiac Enzymes: No results for input(s): CKTOTAL, CKMB, CKMBINDEX, TROPONINI in the last 168 hours. BNP (last 3 results) No results for input(s): PROBNP in the last 8760 hours. HbA1C: No results for input(s): HGBA1C in the last 72 hours. CBG: No results for input(s): GLUCAP in the last 168 hours. Lipid Profile: No results for input(s): CHOL, HDL, LDLCALC, TRIG, CHOLHDL, LDLDIRECT in the last 72 hours. Thyroid Function Tests: No results for input(s): TSH, T4TOTAL, FREET4, T3FREE, THYROIDAB in the  last 72 hours. Anemia Panel: No results for input(s): VITAMINB12, FOLATE, FERRITIN, TIBC, IRON, RETICCTPCT in the last 72 hours. Urine analysis:    Component Value Date/Time   COLORURINE YELLOW 05/27/2016 1750   APPEARANCEUR CLEAR 05/27/2016 1750   APPEARANCEUR Clear 02/15/2016 1456   LABSPEC 1.005 05/27/2016 1750   LABSPEC 1.006 12/20/2014 1018   PHURINE 7.0 05/27/2016 1750   GLUCOSEU NEGATIVE 05/27/2016 1750   GLUCOSEU Negative 12/20/2014 1018   HGBUR NEGATIVE 05/27/2016 1750   BILIRUBINUR NEGATIVE 05/27/2016 1750   BILIRUBINUR neg 03/13/2016 1700   BILIRUBINUR Negative 02/15/2016 1456   BILIRUBINUR Negative 12/20/2014 1018   KETONESUR NEGATIVE 05/27/2016 1750   PROTEINUR NEGATIVE 05/27/2016 1750   UROBILINOGEN negative 03/13/2016 1700   UROBILINOGEN 0.2 06/13/2012 2306   NITRITE NEGATIVE 05/27/2016 1750   LEUKOCYTESUR NEGATIVE 05/27/2016 1750   LEUKOCYTESUR Negative 02/15/2016 1456   LEUKOCYTESUR Negative 12/20/2014 1018   Sepsis Labs: No results found for this or any previous visit (from the past 240 hour(s)).   Radiological Exams on Admission: No results found.  EKG: Independently reviewed. Sinus tachycardia  Assessment/Plan Alcohol withdrawal : Acute. Patient reports drinking three 24 ounce beers per day on average and presenting for all withdrawals. Patient given banana bag and ED and additional 1 L of normal saline IV fluids. - Admit to telemetry bed - CWIA Protocol   - Seizure protocol initiated - Ativan IV prn Seizure activity  Alcohol abuse with alcohol intoxication - Social work consult  Vaginal bleeding H&H on admission is within normal limits - Repeat H&H in a.m. - May warrant OB/GYN consult in a.m.    Dehydration: Acute. As seen by the elevated BUN 23 and Cr 0.64. - IVF NS at 142m/hr  Nausea and vomiting - Zofran prn N/V  Hypokalemia: Potassium 3.3. Suspect this is related to patient's reports of nausea vomiting and diarrhea. - Potassium  chloride 30 mEq IV 1 dose now.  - Continue to monitor and replace as needed    Anxiety: Likely exacerbated by alcohol withdrawal symptoms. - continue to monitor  Tobacco abuse: Patient currently smokes half pack cigarettes per day - Counseled patient on his cessation of tobacco  DVT prophylaxis: Lovenox Code Status: Full  Family Communication: Discussed plan of care with patient and significant other at bedside Disposition Plan: TBD Consults called: none   Admission status:  Telemetry  RNorval MortonMD [facility] Pager [phone] If 7PM-7AM, please contact night-coverage [email or web address] Password [email or web address]  08/03/2016, 11:14 PM

## 2016-08-04 DIAGNOSIS — E876 Hypokalemia: Secondary | ICD-10-CM | POA: Diagnosis not present

## 2016-08-04 DIAGNOSIS — Z720 Tobacco use: Secondary | ICD-10-CM

## 2016-08-04 DIAGNOSIS — Z801 Family history of malignant neoplasm of trachea, bronchus and lung: Secondary | ICD-10-CM | POA: Diagnosis not present

## 2016-08-04 DIAGNOSIS — F329 Major depressive disorder, single episode, unspecified: Secondary | ICD-10-CM | POA: Diagnosis present

## 2016-08-04 DIAGNOSIS — Z803 Family history of malignant neoplasm of breast: Secondary | ICD-10-CM | POA: Diagnosis not present

## 2016-08-04 DIAGNOSIS — Y905 Blood alcohol level of 100-119 mg/100 ml: Secondary | ICD-10-CM | POA: Diagnosis present

## 2016-08-04 DIAGNOSIS — Z888 Allergy status to other drugs, medicaments and biological substances status: Secondary | ICD-10-CM | POA: Diagnosis not present

## 2016-08-04 DIAGNOSIS — F419 Anxiety disorder, unspecified: Secondary | ICD-10-CM | POA: Diagnosis present

## 2016-08-04 DIAGNOSIS — F1010 Alcohol abuse, uncomplicated: Secondary | ICD-10-CM

## 2016-08-04 DIAGNOSIS — K760 Fatty (change of) liver, not elsewhere classified: Secondary | ICD-10-CM | POA: Diagnosis present

## 2016-08-04 DIAGNOSIS — Z881 Allergy status to other antibiotic agents status: Secondary | ICD-10-CM | POA: Diagnosis not present

## 2016-08-04 DIAGNOSIS — Z79899 Other long term (current) drug therapy: Secondary | ICD-10-CM | POA: Diagnosis not present

## 2016-08-04 DIAGNOSIS — Z853 Personal history of malignant neoplasm of breast: Secondary | ICD-10-CM | POA: Diagnosis not present

## 2016-08-04 DIAGNOSIS — Z886 Allergy status to analgesic agent status: Secondary | ICD-10-CM | POA: Diagnosis not present

## 2016-08-04 DIAGNOSIS — F17210 Nicotine dependence, cigarettes, uncomplicated: Secondary | ICD-10-CM | POA: Diagnosis present

## 2016-08-04 DIAGNOSIS — Z882 Allergy status to sulfonamides status: Secondary | ICD-10-CM | POA: Diagnosis not present

## 2016-08-04 DIAGNOSIS — F10239 Alcohol dependence with withdrawal, unspecified: Principal | ICD-10-CM

## 2016-08-04 DIAGNOSIS — H9042 Sensorineural hearing loss, unilateral, left ear, with unrestricted hearing on the contralateral side: Secondary | ICD-10-CM | POA: Diagnosis present

## 2016-08-04 DIAGNOSIS — E860 Dehydration: Secondary | ICD-10-CM | POA: Diagnosis present

## 2016-08-04 DIAGNOSIS — Z91040 Latex allergy status: Secondary | ICD-10-CM | POA: Diagnosis not present

## 2016-08-04 DIAGNOSIS — Z823 Family history of stroke: Secondary | ICD-10-CM | POA: Diagnosis not present

## 2016-08-04 LAB — CBC
HCT: 38.7 % (ref 36.0–46.0)
Hemoglobin: 12.6 g/dL (ref 12.0–15.0)
MCH: 29.1 pg (ref 26.0–34.0)
MCHC: 32.6 g/dL (ref 30.0–36.0)
MCV: 89.4 fL (ref 78.0–100.0)
PLATELETS: 255 10*3/uL (ref 150–400)
RBC: 4.33 MIL/uL (ref 3.87–5.11)
RDW: 16.2 % — AB (ref 11.5–15.5)
WBC: 12.1 10*3/uL — AB (ref 4.0–10.5)

## 2016-08-04 LAB — URINALYSIS, ROUTINE W REFLEX MICROSCOPIC
Bilirubin Urine: NEGATIVE
Glucose, UA: NEGATIVE mg/dL
Ketones, ur: NEGATIVE mg/dL
Leukocytes, UA: NEGATIVE
Nitrite: NEGATIVE
Protein, ur: 100 mg/dL — AB
Specific Gravity, Urine: 1.026 (ref 1.005–1.030)
pH: 6 (ref 5.0–8.0)

## 2016-08-04 LAB — COMPREHENSIVE METABOLIC PANEL
ALBUMIN: 3.1 g/dL — AB (ref 3.5–5.0)
ALT: 32 U/L (ref 14–54)
AST: 61 U/L — AB (ref 15–41)
Alkaline Phosphatase: 110 U/L (ref 38–126)
Anion gap: 10 (ref 5–15)
BUN: 21 mg/dL — AB (ref 6–20)
CHLORIDE: 103 mmol/L (ref 101–111)
CO2: 23 mmol/L (ref 22–32)
Calcium: 7.2 mg/dL — ABNORMAL LOW (ref 8.9–10.3)
Creatinine, Ser: 0.7 mg/dL (ref 0.44–1.00)
GFR calc Af Amer: >60 mL/min (ref >60)
Glucose, Bld: 92 mg/dL (ref 65–99)
POTASSIUM: 4.2 mmol/L (ref 3.5–5.1)
SODIUM: 136 mmol/L (ref 135–145)
Total Bilirubin: 1 mg/dL (ref 0.3–1.2)
Total Protein: 6.3 g/dL — ABNORMAL LOW (ref 6.5–8.1)

## 2016-08-04 MED ORDER — ENOXAPARIN SODIUM 40 MG/0.4ML ~~LOC~~ SOLN
40.0000 mg | Freq: Every day | SUBCUTANEOUS | Status: DC
  Administered 2016-08-04: 40 mg via SUBCUTANEOUS
  Filled 2016-08-04 (×2): qty 0.4

## 2016-08-04 MED ORDER — FOLIC ACID 1 MG PO TABS
1.0000 mg | ORAL_TABLET | Freq: Every day | ORAL | Status: DC
  Administered 2016-08-04 – 2016-08-06 (×3): 1 mg via ORAL
  Filled 2016-08-04 (×3): qty 1

## 2016-08-04 MED ORDER — LORAZEPAM 1 MG PO TABS
1.0000 mg | ORAL_TABLET | Freq: Four times a day (QID) | ORAL | Status: DC | PRN
  Administered 2016-08-06: 1 mg via ORAL
  Filled 2016-08-04: qty 1

## 2016-08-04 MED ORDER — SODIUM CHLORIDE 0.9 % IV SOLN: 75.0000 mL/h | INTRAVENOUS | Status: DC

## 2016-08-04 MED ORDER — ADULT MULTIVITAMIN W/MINERALS CH
1.0000 | ORAL_TABLET | Freq: Every day | ORAL | Status: DC
  Administered 2016-08-04 – 2016-08-06 (×3): 1 via ORAL
  Filled 2016-08-04 (×3): qty 1

## 2016-08-04 MED ORDER — LORAZEPAM 2 MG/ML IJ SOLN
1.0000 mg | Freq: Four times a day (QID) | INTRAMUSCULAR | Status: DC | PRN
  Administered 2016-08-04 (×2): 1 mg via INTRAVENOUS
  Filled 2016-08-04 (×2): qty 1

## 2016-08-04 MED ORDER — LORAZEPAM 2 MG/ML IJ SOLN
0.0000 mg | Freq: Two times a day (BID) | INTRAMUSCULAR | Status: DC
  Administered 2016-08-06: 2 mg via INTRAVENOUS
  Filled 2016-08-04: qty 1

## 2016-08-04 MED ORDER — ALBUTEROL SULFATE (2.5 MG/3ML) 0.083% IN NEBU: 2.5000 mg | INHALATION_SOLUTION | RESPIRATORY_TRACT | Status: DC | PRN

## 2016-08-04 MED ORDER — SODIUM CHLORIDE 0.9 % IV SOLN
30.0000 meq | Freq: Once | INTRAVENOUS | Status: AC
  Administered 2016-08-04: 30 meq via INTRAVENOUS
  Filled 2016-08-04: qty 15

## 2016-08-04 MED ORDER — NICOTINE 21 MG/24HR TD PT24: 21.0000 mg | MEDICATED_PATCH | Freq: Every day | TRANSDERMAL | Status: DC | PRN

## 2016-08-04 MED ORDER — LORAZEPAM 2 MG/ML IJ SOLN
1.0000 mg | INTRAMUSCULAR | Status: DC | PRN
  Administered 2016-08-04: 2 mg via INTRAVENOUS
  Filled 2016-08-04: qty 1

## 2016-08-04 MED ORDER — SODIUM CHLORIDE 0.9 % IV SOLN
INTRAVENOUS | Status: DC
  Administered 2016-08-04 – 2016-08-05 (×3): via INTRAVENOUS

## 2016-08-04 MED ORDER — LORAZEPAM 2 MG/ML IJ SOLN
0.0000 mg | Freq: Four times a day (QID) | INTRAMUSCULAR | Status: AC
  Administered 2016-08-04 (×3): 1 mg via INTRAVENOUS
  Administered 2016-08-05: 2 mg via INTRAVENOUS
  Administered 2016-08-05: 1 mg via INTRAVENOUS
  Administered 2016-08-05 – 2016-08-06 (×2): 2 mg via INTRAVENOUS
  Filled 2016-08-04 (×9): qty 1

## 2016-08-04 MED ORDER — THIAMINE HCL 100 MG/ML IJ SOLN: 100.0000 mg | Freq: Every day | INTRAMUSCULAR | Status: DC

## 2016-08-04 MED ORDER — VITAMIN B-1 100 MG PO TABS
100.0000 mg | ORAL_TABLET | Freq: Every day | ORAL | Status: DC
  Administered 2016-08-04 – 2016-08-06 (×3): 100 mg via ORAL
  Filled 2016-08-04 (×3): qty 1

## 2016-08-04 NOTE — Progress Notes (Signed)
PROGRESS NOTE    Kristine Macias  BVQ:[id number]  DOB: 06/12/1986  DOA: 08/03/2016 PCP: Leonides Sake, MD Outpatient Specialists:   Hospital course: HPI: Kristine Macias is a 30 y.o. female with medical history significant of alcohol abuse, alcohol withdrawal seizures, alcoholic hepatitis, status post C-section 1 month ago, and depression/anxiety; who presents with complaints of alcohol withdrawal and wanting to detox. Patient provides her own history, but parts of the history are contradicted once patient's significant other arrives. Patient notes that she initially had bleeding after her C-section that seemed to resolve. Thereafter, she reports started back drinking on average three 24 ounce cans of beer per day. Within a day or so of restarting drinking patient complained of vaginal bleeding. Patient reports constant bleeding requiring her to change her pads every hour or so for the last week or more. Associated symptoms with this include generalized weakness, decreased appetite, diarrhea, nausea, vomiting, palpitations, anxiety, and reported seizure. Patient gives history that the husband witnessed the seizure prior to arrival today. However, husband, as in and noticed that he did not witness her having a seizure and that he was taking care of the kids.  Assessment & Plan:   Alcohol withdrawal : Acute. Patient reports drinking three 24 ounce beers per day on average and presenting for all withdrawals. Patient given banana bag and ED and additional 1 L of normal saline IV fluids. - Admit to telemetry bed - CWIA Protocol   - Seizure protocol initiated - Ativan IV prn Seizure activity  Alcohol abuse with alcohol intoxication - Social work consult  Vaginal bleeding H&H on admission is within normal limits - Follow Hg  Dehydration: Acute. As seen by the elevated BUN/Cr - IVF NS at 166m/hr  Nausea and vomiting - Zofran prn N/V  Hypokalemia: Potassium 3.3. Suspect this  is related to patient's reports of nausea vomiting and diarrhea. - Potassium chloride 30 mEq IV 1 dose now.  - Continue to monitor and replace as needed    Anxiety: Likely exacerbated by alcohol withdrawal symptoms. - continue to monitor  Tobacco abuse: Patient currently smokes half pack cigarettes per day - Counseled patient on his cessation of tobacco  DVT prophylaxis: Lovenox Code Status: Full  Family Communication: Discussed plan of care with patient and significant other at bedside Disposition Plan: TBD Consults called: none   Admission status:  Telemetry  Subjective: Pt without complaints, no seizure activity seen, sitter at bedside  Objective: Vitals:   08/03/16 2251 08/03/16 2354 08/04/16 0503 08/04/16 1334  BP: (!) 137/112 (!) 156/98 (!) 142/100 130/85  Pulse: 102 (!) 113 (!) 112 95  Resp: 16 17 18 18   Temp:  98.6 F (37 C) 99.1 F (37.3 C) 98.6 F (37 C)  TempSrc:  Oral Oral Oral  SpO2: 97% 97% 95% 95%   No intake or output data in the 24 hours ending 08/04/16 1440 There were no vitals filed for this visit.  Exam:  General exam: awake, alert, no distress, cooperative.  Respiratory system: Clear. No increased work of breathing. Cardiovascular system: S1 & S2 heard, RRR. No JVD, murmurs, gallops, clicks or pedal edema. Gastrointestinal system: Abdomen is nondistended, soft and nontender. Normal bowel sounds heard. Central nervous system: Alert and oriented. No focal neurological deficits. Extremities: no CCE.  Data Reviewed: Basic Metabolic Panel:  Recent Labs Lab 08/03/16 2026 08/04/16 0540  NA 136 136  K 3.3* 4.2  CL 94* 103  CO2 28 23  GLUCOSE 116* 92  BUN 23*  21*  CREATININE 0.64 0.70  CALCIUM 8.6* 7.2*   Liver Function Tests:  Recent Labs Lab 08/03/16 2026 08/04/16 0540  AST 55* 61*  ALT 39 32  ALKPHOS 154* 110  BILITOT 0.7 1.0  PROT 8.6* 6.3*  ALBUMIN 4.2 3.1*   No results for input(s): LIPASE, AMYLASE in the last 168  hours. No results for input(s): AMMONIA in the last 168 hours. CBC:  Recent Labs Lab 08/03/16 2026 08/04/16 0540  WBC 9.1 12.1*  HGB 15.5* 12.6  HCT 45.8 38.7  MCV 87.2 89.4  PLT 323 255   Cardiac Enzymes: No results for input(s): CKTOTAL, CKMB, CKMBINDEX, TROPONINI in the last 168 hours. CBG (last 3)  No results for input(s): GLUCAP in the last 72 hours. No results found for this or any previous visit (from the past 240 hour(s)).   Studies: No results found.   Scheduled Meds: . enoxaparin (LOVENOX) injection  40 mg Subcutaneous QHS  . folic acid  1 mg Oral Daily  . LORazepam  0-4 mg Intravenous Q6H   Followed by  . [START ON 08/06/2016] LORazepam  0-4 mg Intravenous Q12H  . multivitamin with minerals  1 tablet Oral Daily  . thiamine  100 mg Oral Daily   Or  . thiamine  100 mg Intravenous Daily   Continuous Infusions: . sodium chloride    . sodium chloride 100 mL/hr at 08/04/16 2919    Principal Problem:   Alcohol withdrawal ([place]) Active Problems:   Hypokalemia   Alcohol abuse   Tobacco use  Time spent:   Irwin Brakeman, MD, FAAFP [facility] Pager [phone]  If 7PM-7AM, please contact night-coverage [email or web address] Password [email or web address] 08/04/2016, 2:40 PM    LOS: 0 days

## 2016-08-05 LAB — CBC WITH DIFFERENTIAL/PLATELET
Basophils Absolute: 0 10*3/uL (ref 0.0–0.1)
Basophils Relative: 0 %
Eosinophils Absolute: 0.2 10*3/uL (ref 0.0–0.7)
Eosinophils Relative: 3 %
HEMATOCRIT: 33.3 % — AB (ref 36.0–46.0)
HEMOGLOBIN: 10.8 g/dL — AB (ref 12.0–15.0)
LYMPHS ABS: 2.4 10*3/uL (ref 0.7–4.0)
LYMPHS PCT: 33 %
MCH: 29.4 pg (ref 26.0–34.0)
MCHC: 32.4 g/dL (ref 30.0–36.0)
MCV: 90.7 fL (ref 78.0–100.0)
MONOS PCT: 6 %
Monocytes Absolute: 0.5 10*3/uL (ref 0.1–1.0)
NEUTROS ABS: 4.1 10*3/uL (ref 1.7–7.7)
Neutrophils Relative %: 58 %
Platelets: 188 10*3/uL (ref 150–400)
RBC: 3.67 MIL/uL — ABNORMAL LOW (ref 3.87–5.11)
RDW: 16.4 % — ABNORMAL HIGH (ref 11.5–15.5)
WBC: 7.2 10*3/uL (ref 4.0–10.5)

## 2016-08-05 LAB — COMPREHENSIVE METABOLIC PANEL
ALT: 24 U/L (ref 14–54)
ANION GAP: 7 (ref 5–15)
AST: 40 U/L (ref 15–41)
Albumin: 2.9 g/dL — ABNORMAL LOW (ref 3.5–5.0)
Alkaline Phosphatase: 94 U/L (ref 38–126)
BILIRUBIN TOTAL: 0.7 mg/dL (ref 0.3–1.2)
BUN: 9 mg/dL (ref 6–20)
CALCIUM: 7.8 mg/dL — AB (ref 8.9–10.3)
CO2: 22 mmol/L (ref 22–32)
CREATININE: 0.42 mg/dL — AB (ref 0.44–1.00)
Chloride: 107 mmol/L (ref 101–111)
GFR calc non Af Amer: >60 mL/min (ref >60)
Glucose, Bld: 99 mg/dL (ref 65–99)
Potassium: 3.3 mmol/L — ABNORMAL LOW (ref 3.5–5.1)
Sodium: 136 mmol/L (ref 135–145)
TOTAL PROTEIN: 5.7 g/dL — AB (ref 6.5–8.1)

## 2016-08-05 MED ORDER — ONDANSETRON HCL 4 MG/2ML IJ SOLN
4.0000 mg | Freq: Four times a day (QID) | INTRAMUSCULAR | Status: DC | PRN
  Filled 2016-08-05: qty 2

## 2016-08-05 MED ORDER — ONDANSETRON HCL 4 MG PO TABS: 4.0000 mg | ORAL_TABLET | Freq: Three times a day (TID) | ORAL | Status: DC | PRN

## 2016-08-05 MED ORDER — POTASSIUM CHLORIDE CRYS ER 20 MEQ PO TBCR
40.0000 meq | EXTENDED_RELEASE_TABLET | Freq: Once | ORAL | Status: AC
  Administered 2016-08-05: 40 meq via ORAL
  Filled 2016-08-05: qty 2

## 2016-08-05 MED ORDER — FERROUS SULFATE 325 (65 FE) MG PO TABS
325.0000 mg | ORAL_TABLET | Freq: Every day | ORAL | Status: DC
  Administered 2016-08-05 – 2016-08-06 (×2): 325 mg via ORAL
  Filled 2016-08-05 (×2): qty 1

## 2016-08-05 NOTE — Progress Notes (Signed)
PROGRESS NOTE    Kristine Macias  TGY:[id number]  DOB: [birth year]  DOA: 08/03/2016 PCP: Leonides Sake, MD Outpatient Specialists:   Hospital course: HPI: Kristine Macias is a 30 y.o. female with medical history significant of alcohol abuse, alcohol withdrawal seizures, alcoholic hepatitis, status post C-section 1 month ago, and depression/anxiety; who presents with complaints of alcohol withdrawal and wanting to detox. Patient provides her own history, but parts of the history are contradicted once patient's significant other arrives. Patient notes that she initially had bleeding after her C-section that seemed to resolve. Thereafter, she reports started back drinking on average three 24 ounce cans of beer per day. Within a day or so of restarting drinking patient complained of vaginal bleeding. Patient reports constant bleeding requiring her to change her pads every hour or so for the last week or more. Associated symptoms with this include generalized weakness, decreased appetite, diarrhea, nausea, vomiting, palpitations, anxiety, and reported seizure. Patient gives history that the husband witnessed the seizure prior to arrival today. However, husband, as in and noticed that he did not witness her having a seizure and that he was taking care of the kids.  Assessment & Plan:   Alcohol withdrawal : Acute. Patient reports drinking three 24 ounce beers per day on average and presenting for all withdrawals. Patient given banana bag and ED and additional 1 L of normal saline IV fluids. - Admit to telemetry bed - CWIA Protocol   - Seizure protocol initiated - Ativan IV prn Seizure activity  Alcohol abuse with alcohol intoxication - Social work consult  Vaginal bleeding H&H on admission is within normal limits - follow up with OB/GYN, start iron tablet - consider 1 week of megace if persists  Dehydration: Acute. As seen by the elevated BUN/Cr - IVF NS at 167m/hr  Nausea and  vomiting - Zofran prn N/V  Hypokalemia: Potassium 3.3. Suspect this is related to patient's reports of nausea vomiting and diarrhea. - Potassium chloride 30 mEq IV 1 dose now.  - Continue to monitor and replace as needed, gave additional 40 meq x 1 on 12/10   Anxiety: Likely exacerbated by alcohol withdrawal symptoms. - continue to monitor  Tobacco abuse: Patient currently smokes half pack cigarettes per day - Counseled patient on his cessation of tobacco  DVT prophylaxis: Lovenox Code Status: Full  Family Communication: Discussed plan of care with patient and significant other at bedside Disposition Plan: TBD Consults called: none   Admission status:  Telemetry  Subjective: Pt without complaints, no seizure activity seen, sitter at bedside, vb getting much less  Objective: Vitals:   08/04/16 2109 08/05/16 0457 08/05/16 0808 08/05/16 1331  BP: (!) 134/91 125/87  (!) 152/99  Pulse: (!) 104 89  81  Resp: 18   16  Temp: 98.4 F (36.9 C) 98.7 F (37.1 C)  97.9 F (36.6 C)  TempSrc: Oral Oral  Oral  SpO2: 97% 97%  97%  Weight:   59 kg (130 lb)   Height:   5' 5"  (1.651 m)     Intake/Output Summary (Last 24 hours) at 08/05/16 1436 Last data filed at 08/04/16 1556  Gross per 24 hour  Intake           598.33 ml  Output                0 ml  Net           598.33 ml   Filed Weights   08/05/16 02876  Weight: 59 kg (130 lb)    Exam:  General exam: awake, alert, no distress, cooperative.  Respiratory system: Clear. No increased work of breathing. Cardiovascular system: S1 & S2 heard, RRR. No JVD, murmurs, gallops, clicks or pedal edema. Gastrointestinal system: Abdomen is nondistended, soft and nontender. Normal bowel sounds heard. Central nervous system: Alert and oriented. No focal neurological deficits. Extremities: no CCE.  Data Reviewed: Basic Metabolic Panel:  Recent Labs Lab 08/03/16 2026 08/04/16 0540 08/05/16 0550  NA 136 136 136  K 3.3* 4.2 3.3*  CL  94* 103 107  CO2 28 23 22   GLUCOSE 116* 92 99  BUN 23* 21* 9  CREATININE 0.64 0.70 0.42*  CALCIUM 8.6* 7.2* 7.8*   Liver Function Tests:  Recent Labs Lab 08/03/16 2026 08/04/16 0540 08/05/16 0550  AST 55* 61* 40  ALT 39 32 24  ALKPHOS 154* 110 94  BILITOT 0.7 1.0 0.7  PROT 8.6* 6.3* 5.7*  ALBUMIN 4.2 3.1* 2.9*   No results for input(s): LIPASE, AMYLASE in the last 168 hours. No results for input(s): AMMONIA in the last 168 hours. CBC:  Recent Labs Lab 08/03/16 2026 08/04/16 0540 08/05/16 0550  WBC 9.1 12.1* 7.2  NEUTROABS  --   --  4.1  HGB 15.5* 12.6 10.8*  HCT 45.8 38.7 33.3*  MCV 87.2 89.4 90.7  PLT 323 255 188   Cardiac Enzymes: No results for input(s): CKTOTAL, CKMB, CKMBINDEX, TROPONINI in the last 168 hours. CBG (last 3)  No results for input(s): GLUCAP in the last 72 hours. No results found for this or any previous visit (from the past 240 hour(s)).   Studies: No results found.   Scheduled Meds: . enoxaparin (LOVENOX) injection  40 mg Subcutaneous QHS  . ferrous sulfate  325 mg Oral Q breakfast  . folic acid  1 mg Oral Daily  . LORazepam  0-4 mg Intravenous Q6H   Followed by  . [START ON 08/06/2016] LORazepam  0-4 mg Intravenous Q12H  . multivitamin with minerals  1 tablet Oral Daily  . thiamine  100 mg Oral Daily   Or  . thiamine  100 mg Intravenous Daily   Continuous Infusions:  Principal Problem:   Alcohol withdrawal ([facility]) Active Problems:   Hypokalemia   Alcohol abuse   Tobacco use  Time spent:   Irwin Brakeman, MD, FAAFP [facility] Pager [phone]  If 7PM-7AM, please contact night-coverage [email or web address] Password [email or web address] 08/05/2016, 2:36 PM    LOS: 1 day

## 2016-08-06 MED ORDER — BUTALBITAL-APAP-CAFFEINE 50-325-40 MG PO TABS
1.0000 | ORAL_TABLET | Freq: Once | ORAL | Status: AC
  Administered 2016-08-06: 1 via ORAL
  Filled 2016-08-06: qty 1

## 2016-08-06 MED ORDER — THIAMINE HCL 100 MG PO TABS: 100.0000 mg | ORAL_TABLET | Freq: Every day | ORAL | 0 refills | Status: DC

## 2016-08-06 MED ORDER — ADULT MULTIVITAMIN W/MINERALS CH: 1.0000 | ORAL_TABLET | Freq: Every day | ORAL | 0 refills | Status: DC

## 2016-08-06 MED ORDER — FOLIC ACID 1 MG PO TABS: 1.0000 mg | ORAL_TABLET | Freq: Every day | ORAL | 0 refills | Status: DC

## 2016-08-06 MED ORDER — FERROUS SULFATE 325 (65 FE) MG PO TABS: 325.0000 mg | ORAL_TABLET | Freq: Every day | ORAL | 0 refills | Status: DC

## 2016-08-06 NOTE — Clinical Social Work Note (Addendum)
Clinical Social Work Assessment  Patient Details  Name: Kristine Macias MRN: [id number] Date of Birth: 01-06-1986  Date of referral:  08/06/16               Reason for consult:  Substance Use/ETOH Abuse                Permission sought to share information with:  Facility Sport and exercise psychologist, Family Supports Permission granted to share information::     Name::      Cherisa Brucker   Agency::     Relationship::    Mother  Contact Information:   [phone]  Housing/Transportation Living arrangements for the past 2 months:   Single Family Home Source of Information:  Patient Patient Interpreter Needed:  None Criminal Activity/Legal Involvement Pertinent to Current Situation/Hospitalization:  No - Comment as needed Significant Relationships:  Spouse Lives with:  Self Do you feel safe going back to the place where you live?  Yes Need for family participation in patient care:  No (Coment)  Care giving concerns:  Patient admitted for alcohol withdrawl   Social Worker assessment / plan:  LCSWA met with patient at bedside, explain reason for consult to provide resources for SA.  Patient agreeable to talk. Patient reports she has been lives at home with her spouse and three children. For about town years the patient reports she has been struggling with substance use. She reports it started after her father passed away about two years ago. She states, " I never go over his death." The patient reports she drinks day. Patient reports she has been to [facility] in the past the past for substance use. She is currently in at [facility]. She reports she has been there for two years and receives counseling and substance abuse treatment. Patient feels it is helpful.  Patient plan is to continue [facility].  [place] educated patient about inpatient facility in surrounding area and [place]. Patient was receptive to [employer] of resources.  Employment status:    Insurance  information:  Medicaid In [place] PT Recommendations:  Not assessed at this time Information / Referral to community resources:  Outpatient Substance Abuse Treatment Options  Patient/Family's Response to care:  Agreeable  Patient/Family's Understanding of and Emotional Response to Diagnosis, Current Treatment, and Prognosis: No family at bedside. Patient is currently in outpatient SA treatment, plan is to continue services.   Emotional Assessment Appearance:  Appears stated age Attitude/Demeanor/Rapport:    Affect (typically observed):  Accepting, Pleasant Orientation:  Oriented to Self, Oriented to Place, Oriented to  Time, Oriented to Situation Alcohol / Substance use:  Alcohol Use Psych involvement (Current and /or in the community):  No (Comment)  Discharge Needs  Concerns to be addressed:    Readmission within the last 30 days:  No Current discharge risk:  None Barriers to Discharge:  No Barriers Identified   Lia Hopping, LCSW 08/06/2016, 4:50 PM

## 2016-08-06 NOTE — Discharge Summary (Signed)
Physician Discharge Summary  Kristine Macias HCW:[id number] DOB: 01/12/1986 DOA: 08/03/2016  PCP: Leonides Sake, MD  Admit date: 08/03/2016 Discharge date: 08/06/2016  Admitted From: Home  Disposition:  Home   Recommendations for Outpatient Follow-up:  1. Follow up with PCP in 1 weeks 2. Follow up with Gynecologist in 4 days as scheduled 3. Please obtain CBC in one week 4. Please work on getting patient into further counseling program for substance abuse  Discharge Condition: STABLE CODE STATUS: FULL   Brief/Interim Summary: HPI: Kristine Macias a 30 y.o.femalewith medical history significant of alcohol abuse, alcohol withdrawal seizures, alcoholic hepatitis, status post C-section 1 month ago, and depression/anxiety; who presents with complaints of alcohol withdrawal and wanting to detox. Patient provides her own history, but parts of the history are contradicted oncepatient's significant other arrives. Patient notes that she initially had bleeding after her C-section that seemed to resolve. Thereafter,she reports started back drinking on average three 24 ounce cans of beer per day. Within a day or so of restarting drinking patient complained of vaginal bleeding. Patient reports constant bleeding requiring her to change her pads every hour or so for the last week or more. Associated symptoms with this include generalized weakness, decreased appetite, diarrhea, nausea, vomiting, palpitations, anxiety, and reported seizure. Patient gives history that the husband witnessed the seizure prior to arrival today. However, husband, as in and noticed that he did not witness her having a seizure and that he was taking care of the kids.  Assessment & Plan:   Alcohol withdrawal : Acute. Patient reports drinking three 24 ounce beers per day on average and presenting for all withdrawals. Patient given banana bag and ED and additional 1 L of normal saline IV fluids. - Admit to telemetry  bed - CWIA Protocol  - Seizure protocol initiated - Ativan IV prn Seizure activity  - Pt had no seizure activity and detoxed without difficulty.  She declined placement in outpatient treatment program.    Alcohol abuse with alcohol intoxication - Social work consulted to meet with patient.    Vaginal bleeding H&H on admission is within normal limits - follow up with OB/GYN, start iron tablet daily. - Pt reports that she has her own OB/GYN at [facility] and reports that she does not want a new referral but plans to follow up with her GYN on Friday as already scheduled.    Dehydration: Acute. As seen by the elevated BUN/Cr - Rehydrated with IVFs and BUN/Cr returned to normal.   Nausea and vomiting - Zofran prn N/V  Hypokalemia:Potassium 3.3. Suspect this is related to patient's reports of nausea vomiting and diarrhea. - Potassium chloride 30 mEq IV 1 dose now.  - Repleted   Anxiety: Likely exacerbated by alcohol withdrawal symptoms. - continue to monitor  Tobacco abuse: Patient currently smokes half pack cigarettes per day - Counseled patient on his cessation of tobacco  DVT prophylaxis:Lovenox Code Status:Full Family Communication:Discussed plan of care with patient and significant other at bedside Disposition Plan:Home  Admission status:Telemetry  Discharge Diagnoses:  Principal Problem:   Alcohol withdrawal ([place]) Active Problems:   Hypokalemia   Alcohol abuse   Tobacco use  Discharge Instructions  Discharge Instructions    Discharge instructions    Complete by:  As directed    Avoid all alcohol and recreational drugs Follow up with gynecologist in 1 week Follow up with primary care provider in 1 week Go to alcohol treatment program.       Medication List  STOP taking these medications   vitamin B-12 1000 MCG tablet Commonly known as:  CYANOCOBALAMIN     TAKE these medications   clonazePAM 0.5 MG tablet Commonly known as:  KLONOPIN Take  0.5 mg by mouth 2 (two) times daily. What changed:  Another medication with the same name was removed. Continue taking this medication, and follow the directions you see here.   ferrous sulfate 325 (65 FE) MG tablet Take 1 tablet (325 mg total) by mouth daily with breakfast. Start taking on:  44/10/4740   folic acid 1 MG tablet Commonly known as:  FOLVITE Take 1 tablet (1 mg total) by mouth daily.   multivitamin with minerals Tabs tablet Take 1 tablet by mouth daily. Start taking on:  08/07/2016   thiamine 100 MG tablet Take 1 tablet (100 mg total) by mouth daily.      Follow-up Information    HAMRICK,MAURA L, MD. Schedule an appointment as soon as possible for a visit in 1 week(s).   Specialty:  Family Medicine Why:  Hospital Follow Up  Contact information: [place] [address] [phone]          Allergies  Allergen Reactions  . Aspirin Shortness Of Breath  . Effexor [Venlafaxine] Anaphylaxis  . Gabapentin Anaphylaxis  . Sulfa Antibiotics Anaphylaxis  . Tetracyclines & Related Anaphylaxis  . Trazodone And Nefazodone Anaphylaxis  . Latex Hives and Itching  . Paxil [Paroxetine] Hives and Itching  . Seroquel [Quetiapine Fumarate] Hives and Itching  . Zoloft [Sertraline Hcl] Hives and Itching   Procedures/Studies:  No results found.  Subjective: Pt without complaints.  Vaginal bleeding has slowed down considerably.   Discharge Exam: Vitals:   08/05/16 2052 08/06/16 0439  BP: (!) 141/98 122/78  Pulse: 79 (!) 57  Resp:    Temp: 98.7 F (37.1 C) 98.6 F (37 C)   Vitals:   08/05/16 1331 08/05/16 1437 08/05/16 2052 08/06/16 0439  BP: (!) 152/99 116/80 (!) 141/98 122/78  Pulse: 81  79 (!) 57  Resp: 16     Temp: 97.9 F (36.6 C)  98.7 F (37.1 C) 98.6 F (37 C)  TempSrc: Oral  Oral Oral  SpO2: 97%  99% 97%  Weight:      Height:       General: Pt is alert, awake, not in acute distress Cardiovascular: RRR, S1/S2 +, no rubs,  no gallops Respiratory: CTA bilaterally, no wheezing, no rhonchi Abdominal: Soft, NT, ND, bowel sounds + Extremities: no edema, no cyanosis  The results of significant diagnostics from this hospitalization (including imaging, microbiology, ancillary and laboratory) are listed below for reference.     Microbiology: No results found for this or any previous visit (from the past 240 hour(s)).   Labs: BNP (last 3 results)  Recent Labs  10/04/15 2021  BNP 188.4*   Basic Metabolic Panel:  Recent Labs Lab 08/03/16 2026 08/04/16 0540 08/05/16 0550  NA 136 136 136  K 3.3* 4.2 3.3*  CL 94* 103 107  CO2 28 23 22   GLUCOSE 116* 92 99  BUN 23* 21* 9  CREATININE 0.64 0.70 0.42*  CALCIUM 8.6* 7.2* 7.8*   Liver Function Tests:  Recent Labs Lab 08/03/16 2026 08/04/16 0540 08/05/16 0550  AST 55* 61* 40  ALT 39 32 24  ALKPHOS 154* 110 94  BILITOT 0.7 1.0 0.7  PROT 8.6* 6.3* 5.7*  ALBUMIN 4.2 3.1* 2.9*   No results for input(s): LIPASE, AMYLASE in the last 168  hours. No results for input(s): AMMONIA in the last 168 hours. CBC:  Recent Labs Lab 08/03/16 2026 08/04/16 0540 08/05/16 0550  WBC 9.1 12.1* 7.2  NEUTROABS  --   --  4.1  HGB 15.5* 12.6 10.8*  HCT 45.8 38.7 33.3*  MCV 87.2 89.4 90.7  PLT 323 255 188   Cardiac Enzymes: No results for input(s): CKTOTAL, CKMB, CKMBINDEX, TROPONINI in the last 168 hours. BNP: Invalid input(s): POCBNP CBG: No results for input(s): GLUCAP in the last 168 hours. D-Dimer No results for input(s): DDIMER in the last 72 hours. Hgb A1c No results for input(s): HGBA1C in the last 72 hours. Lipid Profile No results for input(s): CHOL, HDL, LDLCALC, TRIG, CHOLHDL, LDLDIRECT in the last 72 hours. Thyroid function studies No results for input(s): TSH, T4TOTAL, T3FREE, THYROIDAB in the last 72 hours.  Invalid input(s): FREET3 Anemia work up No results for input(s): VITAMINB12, FOLATE, FERRITIN, TIBC, IRON, RETICCTPCT in the last 72  hours. Urinalysis    Component Value Date/Time   COLORURINE YELLOW 08/04/2016 0110   APPEARANCEUR HAZY (A) 08/04/2016 0110   APPEARANCEUR Clear 02/15/2016 1456   LABSPEC 1.026 08/04/2016 0110   LABSPEC 1.006 12/20/2014 1018   PHURINE 6.0 08/04/2016 0110   GLUCOSEU NEGATIVE 08/04/2016 0110   GLUCOSEU Negative 12/20/2014 1018   HGBUR LARGE (A) 08/04/2016 0110   BILIRUBINUR NEGATIVE 08/04/2016 0110   BILIRUBINUR neg 03/13/2016 1700   BILIRUBINUR Negative 02/15/2016 1456   BILIRUBINUR Negative 12/20/2014 1018   KETONESUR NEGATIVE 08/04/2016 0110   PROTEINUR 100 (A) 08/04/2016 0110   UROBILINOGEN negative 03/13/2016 1700   UROBILINOGEN 0.2 06/13/2012 2306   NITRITE NEGATIVE 08/04/2016 0110   LEUKOCYTESUR NEGATIVE 08/04/2016 0110   LEUKOCYTESUR Negative 02/15/2016 1456   LEUKOCYTESUR Negative 12/20/2014 1018   Sepsis Labs Invalid input(s): PROCALCITONIN,  WBC,  LACTICIDVEN Microbiology No results found for this or any previous visit (from the past 240 hour(s)).  Time coordinating discharge: 27 minutes  SIGNED:  Irwin Brakeman, MD  [facility] 08/06/2016, 10:48 AM Pager   If 7PM-7AM, please contact night-coverage [email or web address] Password [email or web address]

## 2016-08-29 ENCOUNTER — Encounter (HOSPITAL_COMMUNITY): Payer: Self-pay | Admitting: Emergency Medicine

## 2016-08-29 ENCOUNTER — Inpatient Hospital Stay (HOSPITAL_COMMUNITY)
Admission: EM | Admit: 2016-08-29 | Discharge: 2016-09-04 | DRG: 897 | Disposition: A | Discharge status: Home / Self Care | Payer: Medicaid Other | Attending: Family Medicine | Admitting: Family Medicine

## 2016-08-29 DIAGNOSIS — F1320 Sedative, hypnotic or anxiolytic dependence, uncomplicated: Secondary | ICD-10-CM | POA: Diagnosis present

## 2016-08-29 DIAGNOSIS — Z888 Allergy status to other drugs, medicaments and biological substances status: Secondary | ICD-10-CM

## 2016-08-29 DIAGNOSIS — F17210 Nicotine dependence, cigarettes, uncomplicated: Secondary | ICD-10-CM | POA: Diagnosis present

## 2016-08-29 DIAGNOSIS — Z803 Family history of malignant neoplasm of breast: Secondary | ICD-10-CM

## 2016-08-29 DIAGNOSIS — Z91040 Latex allergy status: Secondary | ICD-10-CM

## 2016-08-29 DIAGNOSIS — Z882 Allergy status to sulfonamides status: Secondary | ICD-10-CM

## 2016-08-29 DIAGNOSIS — F419 Anxiety disorder, unspecified: Secondary | ICD-10-CM | POA: Diagnosis present

## 2016-08-29 DIAGNOSIS — K760 Fatty (change of) liver, not elsewhere classified: Secondary | ICD-10-CM | POA: Diagnosis present

## 2016-08-29 DIAGNOSIS — Z853 Personal history of malignant neoplasm of breast: Secondary | ICD-10-CM

## 2016-08-29 DIAGNOSIS — H9042 Sensorineural hearing loss, unilateral, left ear, with unrestricted hearing on the contralateral side: Secondary | ICD-10-CM | POA: Diagnosis present

## 2016-08-29 DIAGNOSIS — Z720 Tobacco use: Secondary | ICD-10-CM | POA: Diagnosis present

## 2016-08-29 DIAGNOSIS — R569 Unspecified convulsions: Secondary | ICD-10-CM | POA: Diagnosis present

## 2016-08-29 DIAGNOSIS — F1020 Alcohol dependence, uncomplicated: Secondary | ICD-10-CM

## 2016-08-29 DIAGNOSIS — F1010 Alcohol abuse, uncomplicated: Secondary | ICD-10-CM | POA: Diagnosis present

## 2016-08-29 DIAGNOSIS — F10239 Alcohol dependence with withdrawal, unspecified: Principal | ICD-10-CM | POA: Diagnosis present

## 2016-08-29 DIAGNOSIS — Z886 Allergy status to analgesic agent status: Secondary | ICD-10-CM

## 2016-08-29 DIAGNOSIS — F10939 Alcohol use, unspecified with withdrawal, unspecified: Secondary | ICD-10-CM | POA: Diagnosis present

## 2016-08-29 DIAGNOSIS — Z8349 Family history of other endocrine, nutritional and metabolic diseases: Secondary | ICD-10-CM

## 2016-08-29 LAB — URINALYSIS, ROUTINE W REFLEX MICROSCOPIC
Bilirubin Urine: NEGATIVE
GLUCOSE, UA: NEGATIVE mg/dL
KETONES UR: NEGATIVE mg/dL
Nitrite: NEGATIVE
PH: 9 — AB (ref 5.0–8.0)
PROTEIN: NEGATIVE mg/dL
Specific Gravity, Urine: 1.006 (ref 1.005–1.030)

## 2016-08-29 LAB — CBC WITH DIFFERENTIAL/PLATELET
BASOS ABS: 0 10*3/uL (ref 0.0–0.1)
BASOS PCT: 1 %
EOS ABS: 0.1 10*3/uL (ref 0.0–0.7)
Eosinophils Relative: 2 %
HCT: 42.3 % (ref 36.0–46.0)
HEMOGLOBIN: 14.1 g/dL (ref 12.0–15.0)
LYMPHS ABS: 2.9 10*3/uL (ref 0.7–4.0)
Lymphocytes Relative: 35 %
MCH: 28.6 pg (ref 26.0–34.0)
MCHC: 33.3 g/dL (ref 30.0–36.0)
MCV: 85.8 fL (ref 78.0–100.0)
Monocytes Absolute: 0.7 10*3/uL (ref 0.1–1.0)
Monocytes Relative: 8 %
NEUTROS PCT: 54 %
Neutro Abs: 4.5 10*3/uL (ref 1.7–7.7)
Platelets: 416 10*3/uL — ABNORMAL HIGH (ref 150–400)
RBC: 4.93 MIL/uL (ref 3.87–5.11)
RDW: 16.3 % — ABNORMAL HIGH (ref 11.5–15.5)
WBC: 8.3 10*3/uL (ref 4.0–10.5)

## 2016-08-29 LAB — RAPID URINE DRUG SCREEN, HOSP PERFORMED
Amphetamines: NOT DETECTED
BARBITURATES: NOT DETECTED
Benzodiazepines: POSITIVE — AB
Cocaine: NOT DETECTED
Opiates: NOT DETECTED
TETRAHYDROCANNABINOL: NOT DETECTED

## 2016-08-29 LAB — COMPREHENSIVE METABOLIC PANEL
ALBUMIN: 4.1 g/dL (ref 3.5–5.0)
ALK PHOS: 101 U/L (ref 38–126)
ALT: 45 U/L (ref 14–54)
AST: 48 U/L — AB (ref 15–41)
Anion gap: 13 (ref 5–15)
BUN: 11 mg/dL (ref 6–20)
CALCIUM: 8.7 mg/dL — AB (ref 8.9–10.3)
CHLORIDE: 100 mmol/L — AB (ref 101–111)
CO2: 22 mmol/L (ref 22–32)
CREATININE: 0.57 mg/dL (ref 0.44–1.00)
GFR calc Af Amer: >60 mL/min (ref >60)
GFR calc non Af Amer: >60 mL/min (ref >60)
GLUCOSE: 112 mg/dL — AB (ref 65–99)
Potassium: 3.7 mmol/L (ref 3.5–5.1)
SODIUM: 135 mmol/L (ref 135–145)
Total Bilirubin: 0.6 mg/dL (ref 0.3–1.2)
Total Protein: 7.6 g/dL (ref 6.5–8.1)

## 2016-08-29 LAB — POC URINE PREG, ED: Preg Test, Ur: NEGATIVE

## 2016-08-29 LAB — ETHANOL: ALCOHOL ETHYL (B): 132 mg/dL — AB (ref <5)

## 2016-08-29 MED ORDER — LORAZEPAM 2 MG/ML IJ SOLN: 0.0000 mg | Freq: Two times a day (BID) | INTRAMUSCULAR | Status: DC

## 2016-08-29 MED ORDER — LORAZEPAM 2 MG/ML IJ SOLN
0.0000 mg | Freq: Four times a day (QID) | INTRAMUSCULAR | Status: DC
  Administered 2016-08-30 (×2): 2 mg via INTRAVENOUS
  Filled 2016-08-29 (×2): qty 1

## 2016-08-29 MED ORDER — LORAZEPAM 2 MG/ML IJ SOLN
1.0000 mg | Freq: Once | INTRAMUSCULAR | Status: AC
  Administered 2016-08-29: 1 mg via INTRAVENOUS
  Filled 2016-08-29: qty 1

## 2016-08-29 MED ORDER — ONDANSETRON HCL 4 MG/2ML IJ SOLN
4.0000 mg | Freq: Once | INTRAMUSCULAR | Status: AC
  Administered 2016-08-29: 4 mg via INTRAVENOUS
  Filled 2016-08-29: qty 2

## 2016-08-29 MED ORDER — SODIUM CHLORIDE 0.9 % IV BOLUS (SEPSIS)
1000.0000 mL | Freq: Once | INTRAVENOUS | Status: AC
  Administered 2016-08-29: 1000 mL via INTRAVENOUS

## 2016-08-29 MED ORDER — VITAMIN B-1 100 MG PO TABS: 100.0000 mg | ORAL_TABLET | Freq: Every day | ORAL | Status: DC

## 2016-08-29 MED ORDER — THIAMINE HCL 100 MG/ML IJ SOLN
100.0000 mg | Freq: Every day | INTRAMUSCULAR | Status: DC
  Administered 2016-08-29: 100 mg via INTRAVENOUS
  Filled 2016-08-29: qty 2

## 2016-08-29 NOTE — ED Triage Notes (Addendum)
Pt BIB EMS from home for seizure that lasted about 1 minute; pt denies alcohol use; states she's been feeling sick the past few days with cough, nausea, and malaise; pt was lethargic on EMS arrival but was not post-ictal; ambulatory on scene; pt admits to not taking her seizure medication today

## 2016-08-29 NOTE — ED Notes (Signed)
Pt is aware urine sample is needed, will hit call button when she is able to provide one.
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
## 2016-08-29 NOTE — ED Notes (Signed)
Bed: VU02 Expected date:  Expected time:  Means of arrival:  Comments: seizures

## 2016-08-30 ENCOUNTER — Encounter (HOSPITAL_COMMUNITY): Payer: Self-pay | Admitting: Internal Medicine

## 2016-08-30 DIAGNOSIS — F1020 Alcohol dependence, uncomplicated: Secondary | ICD-10-CM | POA: Diagnosis present

## 2016-08-30 DIAGNOSIS — F10230 Alcohol dependence with withdrawal, uncomplicated: Secondary | ICD-10-CM

## 2016-08-30 DIAGNOSIS — F419 Anxiety disorder, unspecified: Secondary | ICD-10-CM | POA: Diagnosis present

## 2016-08-30 DIAGNOSIS — Z882 Allergy status to sulfonamides status: Secondary | ICD-10-CM | POA: Diagnosis not present

## 2016-08-30 DIAGNOSIS — F17210 Nicotine dependence, cigarettes, uncomplicated: Secondary | ICD-10-CM | POA: Diagnosis present

## 2016-08-30 DIAGNOSIS — Z853 Personal history of malignant neoplasm of breast: Secondary | ICD-10-CM | POA: Diagnosis not present

## 2016-08-30 DIAGNOSIS — F1010 Alcohol abuse, uncomplicated: Secondary | ICD-10-CM

## 2016-08-30 DIAGNOSIS — K760 Fatty (change of) liver, not elsewhere classified: Secondary | ICD-10-CM | POA: Diagnosis present

## 2016-08-30 DIAGNOSIS — F10939 Alcohol use, unspecified with withdrawal, unspecified: Secondary | ICD-10-CM

## 2016-08-30 DIAGNOSIS — H9042 Sensorineural hearing loss, unilateral, left ear, with unrestricted hearing on the contralateral side: Secondary | ICD-10-CM | POA: Diagnosis present

## 2016-08-30 DIAGNOSIS — Z803 Family history of malignant neoplasm of breast: Secondary | ICD-10-CM | POA: Diagnosis not present

## 2016-08-30 DIAGNOSIS — F1320 Sedative, hypnotic or anxiolytic dependence, uncomplicated: Secondary | ICD-10-CM | POA: Diagnosis present

## 2016-08-30 DIAGNOSIS — R569 Unspecified convulsions: Secondary | ICD-10-CM

## 2016-08-30 DIAGNOSIS — Z888 Allergy status to other drugs, medicaments and biological substances status: Secondary | ICD-10-CM | POA: Diagnosis not present

## 2016-08-30 DIAGNOSIS — Z8349 Family history of other endocrine, nutritional and metabolic diseases: Secondary | ICD-10-CM | POA: Diagnosis not present

## 2016-08-30 DIAGNOSIS — Z91040 Latex allergy status: Secondary | ICD-10-CM | POA: Diagnosis not present

## 2016-08-30 DIAGNOSIS — Z720 Tobacco use: Secondary | ICD-10-CM | POA: Diagnosis not present

## 2016-08-30 DIAGNOSIS — Z886 Allergy status to analgesic agent status: Secondary | ICD-10-CM | POA: Diagnosis not present

## 2016-08-30 DIAGNOSIS — F10239 Alcohol dependence with withdrawal, unspecified: Secondary | ICD-10-CM | POA: Diagnosis not present

## 2016-08-30 DIAGNOSIS — F10231 Alcohol dependence with withdrawal delirium: Secondary | ICD-10-CM | POA: Diagnosis not present

## 2016-08-30 LAB — INFLUENZA PANEL BY PCR (TYPE A & B)
INFLAPCR: NEGATIVE
Influenza B By PCR: NEGATIVE

## 2016-08-30 LAB — C DIFFICILE QUICK SCREEN W PCR REFLEX
C DIFFICILE (CDIFF) TOXIN: NEGATIVE
C Diff antigen: NEGATIVE
C Diff interpretation: NOT DETECTED

## 2016-08-30 LAB — PHOSPHORUS: Phosphorus: 7.4 mg/dL — ABNORMAL HIGH (ref 2.5–4.6)

## 2016-08-30 LAB — MRSA PCR SCREENING: MRSA by PCR: POSITIVE — AB

## 2016-08-30 LAB — GLUCOSE, CAPILLARY: Glucose-Capillary: 108 mg/dL — ABNORMAL HIGH (ref 65–99)

## 2016-08-30 LAB — MAGNESIUM: Magnesium: 1.9 mg/dL (ref 1.7–2.4)

## 2016-08-30 MED ORDER — FOLIC ACID 1 MG PO TABS
1.0000 mg | ORAL_TABLET | Freq: Every day | ORAL | Status: DC
  Administered 2016-08-31 – 2016-09-03 (×4): 1 mg via ORAL
  Filled 2016-08-30 (×4): qty 1

## 2016-08-30 MED ORDER — NICOTINE 21 MG/24HR TD PT24
21.0000 mg | MEDICATED_PATCH | Freq: Every day | TRANSDERMAL | Status: DC
  Filled 2016-08-30 (×2): qty 1

## 2016-08-30 MED ORDER — LORAZEPAM 2 MG/ML IJ SOLN
1.0000 mg | INTRAMUSCULAR | Status: DC | PRN
  Administered 2016-08-30 – 2016-09-03 (×7): 2 mg via INTRAVENOUS
  Filled 2016-08-30 (×7): qty 1

## 2016-08-30 MED ORDER — METOPROLOL TARTRATE 5 MG/5ML IV SOLN
2.5000 mg | Freq: Three times a day (TID) | INTRAVENOUS | Status: DC | PRN
  Administered 2016-08-31: 2.5 mg via INTRAVENOUS
  Filled 2016-08-30: qty 5

## 2016-08-30 MED ORDER — CHLORHEXIDINE GLUCONATE CLOTH 2 % EX PADS
6.0000 | MEDICATED_PAD | Freq: Every day | CUTANEOUS | Status: DC
  Administered 2016-09-01 – 2016-09-04 (×4): 6 via TOPICAL

## 2016-08-30 MED ORDER — ADULT MULTIVITAMIN W/MINERALS CH
1.0000 | ORAL_TABLET | Freq: Every day | ORAL | Status: DC
  Administered 2016-08-31 – 2016-09-03 (×4): 1 via ORAL
  Filled 2016-08-30 (×4): qty 1

## 2016-08-30 MED ORDER — THIAMINE HCL 100 MG/ML IJ SOLN
Freq: Once | INTRAVENOUS | Status: AC
  Administered 2016-08-30: 08:00:00 via INTRAVENOUS
  Filled 2016-08-30: qty 1000

## 2016-08-30 MED ORDER — MUPIROCIN 2 % EX OINT
1.0000 "application " | TOPICAL_OINTMENT | Freq: Two times a day (BID) | CUTANEOUS | Status: AC
  Administered 2016-08-30 – 2016-09-03 (×9): 1 via NASAL
  Filled 2016-08-30 (×2): qty 22

## 2016-08-30 MED ORDER — SODIUM CHLORIDE 0.9 % IV SOLN
75.0000 mL/h | INTRAVENOUS | Status: DC
  Administered 2016-08-30 – 2016-09-04 (×8): 75 mL/h via INTRAVENOUS

## 2016-08-30 MED ORDER — LORAZEPAM 2 MG/ML IJ SOLN
2.0000 mg | INTRAMUSCULAR | Status: DC | PRN
  Administered 2016-08-31 – 2016-09-02 (×8): 2 mg via INTRAVENOUS
  Filled 2016-08-30 (×9): qty 1

## 2016-08-30 MED ORDER — VITAMIN B-1 100 MG PO TABS
100.0000 mg | ORAL_TABLET | Freq: Every day | ORAL | Status: DC
  Administered 2016-08-31 – 2016-09-03 (×4): 100 mg via ORAL
  Filled 2016-08-30 (×4): qty 1

## 2016-08-30 NOTE — Progress Notes (Signed)
PROGRESS NOTE  Kristine Macias JGG:[id number] DOB: [birth year] DOA: 08/29/2016 PCP: Leonides Sake, MD  HPI/Recap of past 24 hours: No  Seizure since admitted, no confusion She does report n/v/d. No fever.  Assessment/Plan: Active Problems:   Alcohol abuse   Tobacco use   Alcohol withdrawal (HCC)   Seizure due to alcohol withdrawal (HCC)   Seizure ([place])  Alcohol withdrawal seizure: on seizure precaution/ on ativan prn  N/v/d: check c diff/gi prc panel  mrsa screening positivity: decolonization.  Alcohol abuse, she report she is under intense outpatient therapy and she is also looking into inpatient therapy Social worker consulted  Smoker: nicotine patch provided  Code Status: full  Family Communication: patient   Disposition Plan: pending   Consultants:  Social worker  Procedures:  none  Antibiotics:  none   Objective: BP 121/75   Pulse 73   Temp 98.1 F (36.7 C) (Oral)   Resp 14   Ht 5' 5"  (1.651 m)   Wt 61.6 kg (135 lb 12.9 oz)   LMP 07/28/2015 (Approximate)   SpO2 96%   Breastfeeding? No   BMI 22.60 kg/m  No intake or output data in the 24 hours ending 08/30/16 1339 Filed Weights   08/30/16 0700  Weight: 61.6 kg (135 lb 12.9 oz)    Exam:   General:  NAD  Cardiovascular: RRR  Respiratory: CTABL  Abdomen: Soft/ND/NT, positive BS  Musculoskeletal: No Edema  Neuro: aaox3  Data Reviewed: Basic Metabolic Panel:  Recent Labs Lab 08/29/16 2102 08/30/16 0754  NA 135  --   K 3.7  --   CL 100*  --   CO2 22  --   GLUCOSE 112*  --   BUN 11  --   CREATININE 0.57  --   CALCIUM 8.7*  --   MG  --  1.9  PHOS  --  7.4*   Liver Function Tests:  Recent Labs Lab 08/29/16 2102  AST 48*  ALT 45  ALKPHOS 101  BILITOT 0.6  PROT 7.6  ALBUMIN 4.1   No results for input(s): LIPASE, AMYLASE in the last 168 hours. No results for input(s): AMMONIA in the last 168 hours. CBC:  Recent Labs Lab 08/29/16 2102  WBC 8.3    NEUTROABS 4.5  HGB 14.1  HCT 42.3  MCV 85.8  PLT 416*   Cardiac Enzymes:   No results for input(s): CKTOTAL, CKMB, CKMBINDEX, TROPONINI in the last 168 hours. BNP (last 3 results)  Recent Labs  10/04/15 2021  BNP 128.0*    ProBNP (last 3 results) No results for input(s): PROBNP in the last 8760 hours.  CBG:  Recent Labs Lab 08/29/16 2045  GLUCAP 108*    Recent Results (from the past 240 hour(s))  MRSA PCR Screening     Status: Abnormal   Collection Time: 08/30/16  6:57 AM  Result Value Ref Range Status   MRSA by PCR POSITIVE (A) NEGATIVE Final    Comment:        The GeneXpert MRSA Assay (FDA approved for NASAL specimens only), is one component of a comprehensive MRSA colonization surveillance program. It is not intended to diagnose MRSA infection nor to guide or monitor treatment for MRSA infections. RESULT CALLED TO, READ BACK BY AND VERIFIED WITH: MORGAN,M @ 1257 ON [id number] BY POTEAT,S   C difficile quick scan w PCR reflex     Status: None   Collection Time: 08/30/16 12:01 PM  Result Value Ref Range Status   C  Diff antigen NEGATIVE NEGATIVE Final   C Diff toxin NEGATIVE NEGATIVE Final   C Diff interpretation No C. difficile detected.  Final     Studies: No results found.  Scheduled Meds: . [START ON 08/31/2016] Chlorhexidine Gluconate Cloth  6 each Topical Q0600  . LORazepam  0-4 mg Intravenous Q6H   Followed by  . [START ON 09/01/2016] LORazepam  0-4 mg Intravenous Q12H  . mupirocin ointment  1 application Nasal BID  . nicotine  21 mg Transdermal Daily  . thiamine  100 mg Oral Daily   Or  . thiamine  100 mg Intravenous Daily    Continuous Infusions: . sodium chloride         [name] MD, PhD  [facility] Pager [phone]. If 7PM-7AM, please contact night-coverage at [email or web address], password [facility] 08/30/2016, 1:39 PM  LOS: 0 days

## 2016-08-30 NOTE — H&P (Addendum)
Kristine Macias PFX:[id number] DOB: 09-Jan-1986 DOA: 08/29/2016     PCP: Leonides Sake, MD   Outpatient Specialists: Neurology and psychiatry at [facility] not sure she still phone them Patient coming from:    home Lives   With family    Chief Complaint: Seizure  HPI: Kristine Macias is a 31 y.o. female with medical history significant of alcohol abuse, tobacco abuse, alcoholic proctitis, portal vein thrombosis, anemia, history of seizures or history at the alcohol withdrawal seizures    Presented with seizures states witnessed by her family she had had 2 seizures  Examination states she takes clonazepam for her seizure disorder. Patient states she stopped taking Klonapin  past 4 days because she was drinking in she did not want it to interact with alcohol. Examination states that she's been having cough nausea and malaise the past few days she describes seizure lasting for about 1 minute at the time of EMS arrival patient was lethargic but not postictal, ambulatory. Days her husband witnesses seizure. States several grand mal seizures. This is typical for her when she tries to stop drinking. No associated incontinence patient reports that she has our prior to seizing. Last alcoholic drink was on third of January shortly after midnight prior to this patient reports she's been drinking heavily for the past 4 days and try to stop today. Reports jitteriness muscle cramps and confusion.  Patient has been recently admitted in December wishing to undergo detox from alcohol. Regarding pertinent Chronic problems: In November patient had a C-section resulting in life birth. At the time of discharge she was discharged on Klonopin with follow-up with neurology and psychiatry   IN ER:  Temp (24hrs), Avg:97.5 F (36.4 C), Min:97.5 F (36.4 C), Max:97.5 F (36.4 C)      RR 21 98 %RA HR 99  BP 129/91 EtOH 132 WBC 8.3 Hg 14.1  NA 135 K 3.7Cr 0.57 AST48 Following Medications were ordered in  ER: Medications  thiamine (VITAMIN B-1) tablet 100 mg ( Oral See Alternative 08/29/16 2145)    Or  thiamine (B-1) injection 100 mg (100 mg Intravenous Given 08/29/16 2145)  LORazepam (ATIVAN) injection 0-4 mg (2 mg Intravenous Given 08/30/16 0025)    Followed by  LORazepam (ATIVAN) injection 0-4 mg (not administered)  sodium chloride 0.9 % bolus 1,000 mL (0 mLs Intravenous Stopped 08/29/16 2216)  ondansetron (ZOFRAN) injection 4 mg (4 mg Intravenous Given 08/29/16 2108)  LORazepam (ATIVAN) injection 1 mg (1 mg Intravenous Given 08/29/16 2108)      Hospitalist was called for admission for Alcohol/benzodiazepine withdrawal resulting in seizure  Review of Systems:    Pertinent positives include: fatigue, nasal congestion, malaise, nausea, vomiting, tremor non-productive cough, Constitutional:  No weight loss, night sweats, Fevers, chills, , weight loss  HEENT:  No headaches, Difficulty swallowing,Tooth/dental problems,Sore throat,  No sneezing, itching, ear ache,  post nasal drip,  Cardio-vascular:  No chest pain, Orthopnea, PND, anasarca, dizziness, palpitations. no Bilateral lower extremity swelling  GI:  No heartburn, indigestion, abdominal pain, diarrhea, change in bowel habits, loss of appetite, melena, blood in stool, hematemesis Resp:  no shortness of breath at rest. No dyspnea on exertion, No excess mucus, no productive cough, No  No coughing up of blood.No change in color of mucus.No wheezing. Skin:  no rash or lesions. No jaundice GU:  no dysuria, change in color of urine, no urgency or frequency. No straining to urinate.  No flank pain.  Musculoskeletal:  No joint pain or  no joint swelling. No decreased range of motion. No back pain.  Psych:  No change in mood or affect. No depression or anxiety. No memory loss.  Neuro: no localizing neurological complaints, no tingling, no weakness, no double vision, no gait abnormality, no slurred speech, no confusion  As per HPI otherwise 10  point review of systems negative.   Past Medical History: Past Medical History:  Diagnosis Date  . Alcohol abuse 11/2014   drinking since age 67. chronic, recurrent. at least 2 detox admits before 2015.   Marland Kitchen Alcoholic hepatitis 12/7844   hepatic steatosis on 11/2014 ultrasound.   . Breast cancer [facility])    age 22, lump removed from left breast  . Chlamydia 2007   bacterial vaginosis 09/2011  . Colitis 12/2014   colonoscopy for diarrhea and abnml CT 01/06/15: erythmatous TI (path: active ileitis, ? emerging IBD), rectal erythema (path: mucosal prolapse). Random bx of normal colon (path unremarkable)   . Congenital deafness    left ear only.  Also noted in mother and sibling (unilateral).   . Depression with anxiety initally at age 29  . Failure to thrive in adult 12/2014.    Malnutrition: n/v, not eating, weight loss, BMI 14.  s/p 01/11/2015 PEG (Dr Dorna Leitz).   . Irregular heart beat 2010   wt/diet related after evaluation  . Pancreas divisum 02/2015   Type 1 seen on CT.   Marland Kitchen Pneumothorax, spontaneous, tension   . Renal disorder   . Seizure ([place]) 06/2015   due to ETOH/benzo withdrawal.   . Spontaneous pneumothorax 08/2012   right.  chest tube placed.   . Thrombocytopenia ([place]) 04/2007   Past Surgical History:  Procedure Laterality Date  . CESAREAN SECTION  07/2009    x 1.  G3, Para 2012.   . CHEST TUBE INSERTION    . COLONOSCOPY WITH PROPOFOL N/A 01/06/2015   [facility], Dr Rayann Heman. colonoscopy for diarrhea and abnml CT 01/06/15: erythmatous TI (path: active ileitis, ? emerging IBD), rectal erythema (path: mucosal prolapse). Random bx of normal colon (path unremarkable)   . ESOPHAGOGASTRODUODENOSCOPY N/A 01/06/2015   ;[facility], Dr Rayann Heman. For wt loss, N/V: Normal study, duodenal biopsy/pathology: chronic active duodenitis.   Marland Kitchen ESOPHAGOGASTRODUODENOSCOPY N/A 01/11/2015   Rein-normal with PEG placement  . PEG PLACEMENT N/A 01/11/2015   [facility], Dr Rayann Heman.  for N/V/wt loss/severe malnutrition.      Social  History:  Ambulatory  independently      reports that she has been smoking Cigarettes.  She has been smoking about 2.00 packs per day. She has never used smokeless tobacco. She reports that she drinks about 21.6 oz of alcohol per week . She reports that she does not use drugs.  Allergies:   Allergies  Allergen Reactions  . Aspirin Shortness Of Breath  . Effexor [Venlafaxine] Anaphylaxis  . Gabapentin Anaphylaxis  . Sulfa Antibiotics Anaphylaxis  . Tetracyclines & Related Anaphylaxis  . Trazodone And Nefazodone Anaphylaxis  . Latex Hives and Itching  . Paxil [Paroxetine] Hives and Itching  . Seroquel [Quetiapine Fumarate] Hives and Itching  . Zoloft [Sertraline Hcl] Hives and Itching       Family History:   Family History  Problem Relation Age of Onset  . Thyroid disease Mother   . Cancer Mother     breast cancer  . Cancer Father     lung cancer  . Stroke Other   . Crohn's disease Brother   . Cancer Maternal Grandmother     breast cancer  .  Anesthesia problems Neg Hx     Medications: Prior to Admission medications   Medication Sig Start Date End Date Taking? Authorizing Provider  clonazePAM (KLONOPIN) 0.5 MG tablet Take 0.5 mg by mouth 2 (two) times daily. 03/15/16  Yes Historical Provider, MD  folic acid (FOLVITE) 1 MG tablet Take 1 tablet (1 mg total) by mouth daily. 08/06/16  Yes Clanford Marisa Hua, MD  Multiple Vitamin (MULTIVITAMIN WITH MINERALS) TABS tablet Take 1 tablet by mouth daily. 08/07/16  Yes Clanford Marisa Hua, MD  thiamine 100 MG tablet Take 1 tablet (100 mg total) by mouth daily. 08/06/16  Yes Clanford Marisa Hua, MD  ferrous sulfate 325 (65 FE) MG tablet Take 1 tablet (325 mg total) by mouth daily with breakfast. Patient not taking: Reported on 08/29/2016 08/07/16   Clanford Marisa Hua, MD    Physical Exam: Patient Vitals for the past 24 hrs:  BP Temp Temp src Pulse Resp SpO2  08/30/16 0020 129/91 - - 99 - -  08/30/16 0000 129/91 - - 99 21 98 %   08/29/16 2330 118/86 - - 99 19 97 %  08/29/16 2300 127/90 - - 90 19 96 %  08/29/16 2230 (!) 133/101 - - 99 22 94 %  08/29/16 2200 (!) 127/107 - - 87 13 100 %  08/29/16 2130 136/95 - - - 14 -  08/29/16 2100 129/88 - - - 18 -  08/29/16 2045 - - - 93 20 95 %  08/29/16 2025 126/95 97.5 F (36.4 C) Oral 91 14 95 %    1. General:  in No Acute distress 2. Psychological: Alert and  Oriented 3. Head/ENT:     Dry Mucous Membranes                          Head Non traumatic, neck supple                           Poor Dentition 4. SKIN:  decreased Skin turgor,  Skin clean Dry and intact no rash 5. Heart: Regular rate and rhythm no  Murmur, Rub or gallop 6. Lungs: Clear to auscultation bilaterally, no wheezes or crackles   7. Abdomen: Soft,  non-tender, Non distended 8. Lower extremities: no clubbing, cyanosis, or edema 9. Neurologically  strength 5 out of 5 in all 4 extremities cranial nerves II through XII intact, tremor present 10. MSK: Normal range of motion   body mass index is unknown because there is no height or weight on file.  Labs on Admission:   Labs on Admission: I have personally reviewed following labs and imaging studies  CBC:  Recent Labs Lab 08/29/16 2102  WBC 8.3  NEUTROABS 4.5  HGB 14.1  HCT 42.3  MCV 85.8  PLT 026*   Basic Metabolic Panel:  Recent Labs Lab 08/29/16 2102  NA 135  K 3.7  CL 100*  CO2 22  GLUCOSE 112*  BUN 11  CREATININE 0.57  CALCIUM 8.7*   GFR: CrCl cannot be calculated (Unknown ideal weight.). Liver Function Tests:  Recent Labs Lab 08/29/16 2102  AST 48*  ALT 45  ALKPHOS 101  BILITOT 0.6  PROT 7.6  ALBUMIN 4.1   No results for input(s): LIPASE, AMYLASE in the last 168 hours. No results for input(s): AMMONIA in the last 168 hours. Coagulation Profile: No results for input(s): INR, PROTIME in the last 168 hours. Cardiac Enzymes: No results for  input(s): CKTOTAL, CKMB, CKMBINDEX, TROPONINI in the last 168 hours. BNP  (last 3 results) No results for input(s): PROBNP in the last 8760 hours. HbA1C: No results for input(s): HGBA1C in the last 72 hours. CBG: No results for input(s): GLUCAP in the last 168 hours. Lipid Profile: No results for input(s): CHOL, HDL, LDLCALC, TRIG, CHOLHDL, LDLDIRECT in the last 72 hours. Thyroid Function Tests: No results for input(s): TSH, T4TOTAL, FREET4, T3FREE, THYROIDAB in the last 72 hours. Anemia Panel: No results for input(s): VITAMINB12, FOLATE, FERRITIN, TIBC, IRON, RETICCTPCT in the last 72 hours. Urine analysis:    Component Value Date/Time   COLORURINE YELLOW 08/29/2016 2050   APPEARANCEUR CLEAR 08/29/2016 2050   APPEARANCEUR Clear 02/15/2016 1456   LABSPEC 1.006 08/29/2016 2050   LABSPEC 1.006 12/20/2014 1018   PHURINE 9.0 (H) 08/29/2016 2050   GLUCOSEU NEGATIVE 08/29/2016 2050   GLUCOSEU Negative 12/20/2014 1018   HGBUR LARGE (A) 08/29/2016 2050   BILIRUBINUR NEGATIVE 08/29/2016 2050   BILIRUBINUR neg 03/13/2016 1700   BILIRUBINUR Negative 02/15/2016 1456   BILIRUBINUR Negative 12/20/2014 [place] 08/29/2016 2050   PROTEINUR NEGATIVE 08/29/2016 2050   UROBILINOGEN negative 03/13/2016 1700   UROBILINOGEN 0.2 06/13/2012 2306   NITRITE NEGATIVE 08/29/2016 2050   LEUKOCYTESUR SMALL (A) 08/29/2016 2050   LEUKOCYTESUR Negative 02/15/2016 1456   LEUKOCYTESUR Negative 12/20/2014 1018   Sepsis Labs: @LABRCNTIP (procalcitonin:4,lacticidven:4) )No results found for this or any previous visit (from the past 240 hour(s)).     UA   no evidence of UTI  Hematuria present  No results found for: HGBA1C  CrCl cannot be calculated (Unknown ideal weight.).  BNP (last 3 results) No results for input(s): PROBNP in the last 8760 hours.   ECG REPORT  Independently reviewed Rate: 91  Rhythm: Normal sinus rhythm with short PR interval ST&T Change: No acute ischemic changes   QTC 464  There were no vitals filed for this visit.   Cultures:     Component Value Date/Time   SDES URINE, RANDOM 01/25/2016 1009   SPECREQUEST NONE 01/25/2016 1009   CULT (A) 01/25/2016 1009    1,000 COLONIES/mL INSIGNIFICANT GROWTH Performed at [place] 01/26/2016 FINAL 01/25/2016 1009     Radiological Exams on Admission: No results found.  Chart has been reviewed    Assessment/Plan  31 y.o. female with medical history significant of alcohol abuse, tobacco abuse, alcoholic proctitis, portal vein thrombosis, anemia, history of seizures or history at the alcohol withdrawal seizures Being admitted for Alcohol/benzodiazepine withdrawal resulting in seizure  Present on Admission: . Alcohol abuse spoke about importance of quitting patient states she understands and is interested in quitting alcohol at this point we'll order social work consult . Alcohol withdrawal ([place]) - given severity of alcohol withdrawals in the past will admit to step down start CIWA protocol . Tobacco use - spoke about importance of quitting tobacco cessation protocol, nicotine patch ordered Hematuria patient continues to have vaginal bleeding 8 weeks from delivery which is normal Alcohol withdrawal-induced seizure- treat with when necessary Ativan continue CIWA protocol the time of discharge will need to restart home dose of Klonopin seizure precautions. Given cough and generalized malaise will test for influenza and possibility of atypical presentation Other plan as per orders.  DVT prophylaxis:  SCD    Code Status:  FULL CODE   Family Communication:   Family not  at  Bedside    Disposition Plan:   To home once workup  is complete and patient is stable                  Social work consult                          Consults called: none  Admission status:    inpatient   given severity of alcohol withdrawal and history of seizures secondary to alcohol [employer] past abnormal vital signs and history of Klonopin use suspect patient require more  than 2 midnight's prior to stabilizing.   Level of care     SDU      I have spent a total of 56 min on this admission    Immaculate Crutcher 08/30/2016, 1:16 AM    [facility]  Pager [phone]   after 2 AM please page floor coverage PA If 7AM-7PM, please contact the day team taking care of the patient  [email or web address]  Password [email or web address]

## 2016-08-31 LAB — CBC WITH DIFFERENTIAL/PLATELET
BASOS ABS: 0 10*3/uL (ref 0.0–0.1)
BASOS PCT: 0 %
EOS PCT: 3 %
Eosinophils Absolute: 0.2 10*3/uL (ref 0.0–0.7)
HEMATOCRIT: 38 % (ref 36.0–46.0)
Hemoglobin: 12.2 g/dL (ref 12.0–15.0)
LYMPHS PCT: 37 %
Lymphs Abs: 2.7 10*3/uL (ref 0.7–4.0)
MCH: 28.2 pg (ref 26.0–34.0)
MCHC: 32.1 g/dL (ref 30.0–36.0)
MCV: 88 fL (ref 78.0–100.0)
MONO ABS: 0.4 10*3/uL (ref 0.1–1.0)
Monocytes Relative: 6 %
NEUTROS ABS: 3.9 10*3/uL (ref 1.7–7.7)
Neutrophils Relative %: 54 %
PLATELETS: 283 10*3/uL (ref 150–400)
RBC: 4.32 MIL/uL (ref 3.87–5.11)
RDW: 16.5 % — AB (ref 11.5–15.5)
WBC: 7.3 10*3/uL (ref 4.0–10.5)

## 2016-08-31 LAB — GASTROINTESTINAL PANEL BY PCR, STOOL (REPLACES STOOL CULTURE)

## 2016-08-31 LAB — HEPATIC FUNCTION PANEL
ALBUMIN: 3.3 g/dL — AB (ref 3.5–5.0)
ALT: 37 U/L (ref 14–54)
AST: 49 U/L — AB (ref 15–41)
Alkaline Phosphatase: 88 U/L (ref 38–126)
Bilirubin, Direct: 0.1 mg/dL (ref 0.1–0.5)
Indirect Bilirubin: 0.4 mg/dL (ref 0.3–0.9)
TOTAL PROTEIN: 6.4 g/dL — AB (ref 6.5–8.1)
Total Bilirubin: 0.5 mg/dL (ref 0.3–1.2)

## 2016-08-31 LAB — COMPREHENSIVE METABOLIC PANEL
ALBUMIN: 3.4 g/dL — AB (ref 3.5–5.0)
ALK PHOS: 95 U/L (ref 38–126)
ALT: 49 U/L (ref 14–54)
AST: 98 U/L — AB (ref 15–41)
Anion gap: 9 (ref 5–15)
BILIRUBIN TOTAL: 0.6 mg/dL (ref 0.3–1.2)
BUN: 7 mg/dL (ref 6–20)
CALCIUM: 8.2 mg/dL — AB (ref 8.9–10.3)
CO2: 21 mmol/L — AB (ref 22–32)
CREATININE: 0.47 mg/dL (ref 0.44–1.00)
Chloride: 107 mmol/L (ref 101–111)
GFR calc Af Amer: >60 mL/min (ref >60)
GFR calc non Af Amer: >60 mL/min (ref >60)
GLUCOSE: 99 mg/dL (ref 65–99)
Potassium: 3.7 mmol/L (ref 3.5–5.1)
Sodium: 137 mmol/L (ref 135–145)
Total Protein: 7 g/dL (ref 6.5–8.1)

## 2016-08-31 LAB — BASIC METABOLIC PANEL
ANION GAP: 9 (ref 5–15)
BUN: 9 mg/dL (ref 6–20)
CO2: 22 mmol/L (ref 22–32)
Calcium: 8.2 mg/dL — ABNORMAL LOW (ref 8.9–10.3)
Chloride: 107 mmol/L (ref 101–111)
Creatinine, Ser: 0.63 mg/dL (ref 0.44–1.00)
GLUCOSE: 93 mg/dL (ref 65–99)
POTASSIUM: 3.5 mmol/L (ref 3.5–5.1)
Sodium: 138 mmol/L (ref 135–145)

## 2016-08-31 LAB — MAGNESIUM: Magnesium: 1.8 mg/dL (ref 1.7–2.4)

## 2016-08-31 MED ORDER — ONDANSETRON HCL 4 MG/2ML IJ SOLN
4.0000 mg | Freq: Four times a day (QID) | INTRAMUSCULAR | Status: DC | PRN
  Administered 2016-08-31 – 2016-09-02 (×5): 4 mg via INTRAVENOUS
  Filled 2016-08-31 (×5): qty 2

## 2016-08-31 NOTE — Progress Notes (Signed)
PROGRESS NOTE  Kristine Macias WER:[id number] DOB: Jul 13, 1986 DOA: 08/29/2016 PCP: Leonides Sake, MD  HPI/Recap of past 24 hours:  No  Seizure since admitted, no confusion No vomiting,  C/o feeling nauseous, diarrhea seems slowed down. No fever.  Assessment/Plan: Active Problems:   Alcohol abuse   Tobacco use   Alcohol withdrawal (H[facility])   Seizure due to alcohol withdrawal (H[facility])   Seizure ([name])  Alcohol withdrawal seizure: on seizure precaution/ on ativan prn No seizure since in the hospital  N/V/D:  Denies abdominal pain, no fever, report no sick contact,  Flu swab negative, c diff negative, gi prc panel pending.   lft elevation: normal tbili, normal alk phos, ast elevated, alt wnl.  likely from alcohol. She was tested negative for hepatitis/HIV from prior hospitalization. Prior liver US with "The hepatic echotexture is increased diffusely. There is no focal mass or ductal dilation. The surface contour is minimally irregular."  mrsa screening positivity: decolonization.  Alcohol abuse, she report she is under intense outpatient therapy and she is also looking into inpatient therapy Social worker consulted  Smoker: nicotine patch provided  Code Status: full  Family Communication: patient   Disposition Plan: she is admitted to stepdown, better, out of stepdown to med tele   Consultants:  Social worker  Procedures:  none  Antibiotics:  none   Objective: BP 121/67 (BP Location: Right Arm)   Pulse 71   Temp 97.5 F (36.4 C) (Oral)   Resp 14   Ht _0  (1.651 m)   Wt 61.6 kg (135 lb 12.9 oz)   LMP 07/28/2015 (Approximate)   SpO2 98%   Breastfeeding? No   BMI 22.60 kg/m   Intake/Output Summary (Last 24 hours) at 08/31/16 0847 Last data filed at 08/31/16 0600  Gross per 24 hour  Intake             1040 ml  Output                0 ml  Net             1040 ml   Filed Weights   08/30/16 0700  Weight: 61.6 kg (135 lb 12.9 oz)     Exam:   General:  NAD  Cardiovascular: RRR  Respiratory: CTABL  Abdomen: Soft/ND/NT, positive BS  Musculoskeletal: No Edema  Neuro: aaox3  Data Reviewed: Basic Metabolic Panel:  Recent Labs Lab 08/29/16 2102 08/30/16 0754 08/31/16 0401  NA 135  --  138  K 3.7  --  3.5  CL 100*  --  107  CO2 22  --  22  GLUCOSE 112*  --  93  BUN 11  --  9  CREATININE 0.57  --  0.63  CALCIUM 8.7*  --  8.2*  MG  --  1.9 1.8  PHOS  --  7.4*  --    Liver Function Tests:  Recent Labs Lab 08/29/16 2102 08/31/16 0401  AST 48* 49*  ALT 45 37  ALKPHOS 101 88  BILITOT 0.6 0.5  PROT 7.6 6.4*  ALBUMIN 4.1 3.3*   No results for input(s): LIPASE, AMYLASE in the last 168 hours. No results for input(s): AMMONIA in the last 168 hours. CBC:  Recent Labs Lab 08/29/16 2102 08/31/16 0401  WBC 8.3 7.3  NEUTROABS 4.5 3.9  HGB 14.1 12.2  HCT 42.3 38.0  MCV 85.8 88.0  PLT 416* 283   Cardiac Enzymes:   No results for input(s): CKTOTAL, CKMB, CKMBINDEX, TROPONINI  in the last 168 hours. BNP (last 3 results)  Recent Labs  10/04/15 2021  BNP 128.0*    ProBNP (last 3 results) No results for input(s): PROBNP in the last 8760 hours.  CBG:  Recent Labs Lab 08/29/16 2045  GLUCAP 108*    Recent Results (from the past 240 hour(s))  MRSA PCR Screening     Status: Abnormal   Collection Time: 08/30/16  6:57 AM  Result Value Ref Range Status   MRSA by PCR POSITIVE (A) NEGATIVE Final    Comment:        The GeneXpert MRSA Assay (FDA approved for NASAL specimens only), is one component of a comprehensive MRSA colonization surveillance program. It is not intended to diagnose MRSA infection nor to guide or monitor treatment for MRSA infections. RESULT CALLED TO, READ BACK BY AND VERIFIED WITH: MORGAN,M @ 1257 ON [id number] BY POTEAT,S   C difficile quick scan w PCR reflex     Status: None   Collection Time: 08/30/16 12:01 PM  Result Value Ref Range Status   C Diff antigen  NEGATIVE NEGATIVE Final   C Diff toxin NEGATIVE NEGATIVE Final   C Diff interpretation No C. difficile detected.  Final     Studies: No results found.  Scheduled Meds: . Chlorhexidine Gluconate Cloth  6 each Topical Q0600  . folic acid  1 mg Oral Daily  . multivitamin with minerals  1 tablet Oral Daily  . mupirocin ointment  1 application Nasal BID  . nicotine  21 mg Transdermal Daily  . thiamine  100 mg Oral Daily    Continuous Infusions: . sodium chloride 75 mL/hr (08/31/16 0600)       Harlon Kutner MD, PhD  [facility] Pager [phone]. If 7PM-7AM, please contact night-coverage at [email or web address], password [facility] 08/31/2016, 8:47 AM  LOS: 1 day

## 2016-09-01 LAB — COMPREHENSIVE METABOLIC PANEL
ALBUMIN: 3.4 g/dL — AB (ref 3.5–5.0)
ALK PHOS: 85 U/L (ref 38–126)
ALT: 45 U/L (ref 14–54)
AST: 64 U/L — ABNORMAL HIGH (ref 15–41)
Anion gap: 8 (ref 5–15)
BILIRUBIN TOTAL: 0.5 mg/dL (ref 0.3–1.2)
BUN: 7 mg/dL (ref 6–20)
CALCIUM: 8.2 mg/dL — AB (ref 8.9–10.3)
CO2: 22 mmol/L (ref 22–32)
CREATININE: 0.53 mg/dL (ref 0.44–1.00)
Chloride: 107 mmol/L (ref 101–111)
GFR calc non Af Amer: >60 mL/min (ref >60)
GLUCOSE: 98 mg/dL (ref 65–99)
Potassium: 3.5 mmol/L (ref 3.5–5.1)
SODIUM: 137 mmol/L (ref 135–145)
Total Protein: 6.6 g/dL (ref 6.5–8.1)

## 2016-09-01 LAB — LIPASE, BLOOD: Lipase: 25 U/L (ref 11–51)

## 2016-09-01 LAB — MAGNESIUM: Magnesium: 1.7 mg/dL (ref 1.7–2.4)

## 2016-09-01 LAB — CBC
HEMATOCRIT: 37 % (ref 36.0–46.0)
HEMOGLOBIN: 11.6 g/dL — AB (ref 12.0–15.0)
MCH: 27.8 pg (ref 26.0–34.0)
MCHC: 31.4 g/dL (ref 30.0–36.0)
MCV: 88.5 fL (ref 78.0–100.0)
Platelets: 227 10*3/uL (ref 150–400)
RBC: 4.18 MIL/uL (ref 3.87–5.11)
RDW: 16.6 % — ABNORMAL HIGH (ref 11.5–15.5)
WBC: 6 10*3/uL (ref 4.0–10.5)

## 2016-09-01 MED ORDER — CLONAZEPAM 0.5 MG PO TABS
0.5000 mg | ORAL_TABLET | Freq: Two times a day (BID) | ORAL | Status: DC
  Administered 2016-09-01 – 2016-09-04 (×6): 0.5 mg via ORAL
  Filled 2016-09-01 (×6): qty 1

## 2016-09-01 NOTE — Progress Notes (Signed)
PROGRESS NOTE  Kristine Macias LPF:[id number] DOB: [birth year] DOA: 08/29/2016 PCP: Leonides Sake, MD  Brief Summary:  H/o alcohol abuse, tobacco abuse, alcoholic proctitis, portal vein thrombosis, anemia,  Recurrent hospitalization for alcohol withdrawal seizures Being admitted for Alcohol/benzodiazepine withdrawal resulting in seizure Better, hopefull able to d/c in 1-2 days ,    HPI/Recap of past 24 hours:  No  Seizure since admitted, no confusion No vomiting,  C/o feeling nauseous, diarrhea has slowed down. No fever. Husband in room  Assessment/Plan: Active Problems:   Alcohol abuse   Tobacco use   Alcohol withdrawal (HCC)   Seizure due to alcohol withdrawal (HCC)   Seizure ([place])  Alcohol withdrawal seizure: on seizure precaution/ on ativan prn No seizure since in the hospital  N/V/D:  Denies abdominal pain, no fever, report no sick contact, lipase wnl Flu swab negative, c diff negative, gi prc panel negative. Resolving.  lft elevation: normal tbili, normal alk phos, ast elevated, alt wnl.  likely from alcohol. She was tested negative for hepatitis/HIV from prior hospitalization. Prior liver US with "The hepatic echotexture is increased diffusely. There is no focal mass or ductal dilation. The surface contour is minimally irregular."  mrsa screening positivity: decolonization.  Alcohol abuse, she report she is under intense outpatient therapy and she is also looking into inpatient therapy Social worker consulted ( she has three children by report, the youngest one is two months old )  Smoker: nicotine patch provided  Code Status: full  Family Communication: patient   Disposition Plan: she is admitted to stepdown, better, out of stepdown to med tele. Hopefully able to d/c in 1-2 days   Consultants:  Social worker  Procedures:  none  Antibiotics:  none   Objective: BP 117/70 (BP Location: Right Arm)   Pulse 92   Temp 98.4 F (36.9 C) (Oral)    Resp 16   Ht _0  (1.651 m)   Wt 61.6 kg (135 lb 12.9 oz)   LMP 07/28/2015 (Approximate)   SpO2 96%   Breastfeeding? No   BMI 22.60 kg/m   Intake/Output Summary (Last 24 hours) at 09/01/16 1828 Last data filed at 09/01/16 1500  Gross per 24 hour  Intake             1575 ml  Output                0 ml  Net             1575 ml   Filed Weights   08/30/16 0700  Weight: 61.6 kg (135 lb 12.9 oz)    Exam:   General:  NAD, less tremor  Cardiovascular: RRR  Respiratory: CTABL  Abdomen: Soft/ND/NT, positive BS  Musculoskeletal: No Edema  Neuro: aaox3  Data Reviewed: Basic Metabolic Panel:  Recent Labs Lab 08/29/16 2102 08/30/16 0754 08/31/16 0401 08/31/16 1211 09/01/16 0405  NA 135  --  138 137 137  K 3.7  --  3.5 3.7 3.5  CL 100*  --  107 107 107  CO2 22  --  22 21* 22  GLUCOSE 112*  --  93 99 98  BUN 11  --  _1 CREATININE 0.57  --  0.63 0.47 0.53  CALCIUM 8.7*  --  8.2* 8.2* 8.2*  MG  --  1.9 1.8  --  1.7  PHOS  --  7.4*  --   --   --    Liver Function Tests:  Recent Labs  Lab 08/29/16 2102 08/31/16 0401 08/31/16 1211 09/01/16 0405  AST 48* 49* 98* 64*  ALT 45 37 49 45  ALKPHOS 101 88 95 85  BILITOT 0.6 0.5 0.6 0.5  PROT 7.6 6.4* 7.0 6.6  ALBUMIN 4.1 3.3* 3.4* 3.4*    Recent Labs Lab 09/01/16 0405  LIPASE 25   No results for input(s): AMMONIA in the last 168 hours. CBC:  Recent Labs Lab 08/29/16 2102 08/31/16 0401 09/01/16 0405  WBC 8.3 7.3 6.0  NEUTROABS 4.5 3.9  --   HGB 14.1 12.2 11.6*  HCT 42.3 38.0 37.0  MCV 85.8 88.0 88.5  PLT 416* 283 227   Cardiac Enzymes:   No results for input(s): CKTOTAL, CKMB, CKMBINDEX, TROPONINI in the last 168 hours. BNP (last 3 results)  Recent Labs  10/04/15 2021  BNP 128.0*    ProBNP (last 3 results) No results for input(s): PROBNP in the last 8760 hours.  CBG:  Recent Labs Lab 08/29/16 2045  GLUCAP 108*    Recent Results (from the past 240 hour(s))  MRSA PCR Screening      Status: Abnormal   Collection Time: 08/30/16  6:57 AM  Result Value Ref Range Status   MRSA by PCR POSITIVE (A) NEGATIVE Final    Comment:        The GeneXpert MRSA Assay (FDA approved for NASAL specimens only), is one component of a comprehensive MRSA colonization surveillance program. It is not intended to diagnose MRSA infection nor to guide or monitor treatment for MRSA infections. RESULT CALLED TO, READ BACK BY AND VERIFIED WITH: MORGAN,M @ 1257 ON [id number] BY POTEAT,S   C difficile quick scan w PCR reflex     Status: None   Collection Time: 08/30/16 12:01 PM  Result Value Ref Range Status   C Diff antigen NEGATIVE NEGATIVE Final   C Diff toxin NEGATIVE NEGATIVE Final   C Diff interpretation No C. difficile detected.  Final  Gastrointestinal Panel by PCR , Stool     Status: None   Collection Time: 08/30/16 12:01 PM  Result Value Ref Range Status   Campylobacter species NOT DETECTED NOT DETECTED Final   Plesimonas shigelloides NOT DETECTED NOT DETECTED Final   Salmonella species NOT DETECTED NOT DETECTED Final   Yersinia enterocolitica NOT DETECTED NOT DETECTED Final   Vibrio species NOT DETECTED NOT DETECTED Final   Vibrio cholerae NOT DETECTED NOT DETECTED Final   Enteroaggregative E coli (EAEC) NOT DETECTED NOT DETECTED Final   Enteropathogenic E coli (EPEC) NOT DETECTED NOT DETECTED Final   Enterotoxigenic E coli (ETEC) NOT DETECTED NOT DETECTED Final   Shiga like toxin producing E coli (STEC) NOT DETECTED NOT DETECTED Final   Shigella/Enteroinvasive E coli (EIEC) NOT DETECTED NOT DETECTED Final   Cryptosporidium NOT DETECTED NOT DETECTED Final   Cyclospora cayetanensis NOT DETECTED NOT DETECTED Final   Entamoeba histolytica NOT DETECTED NOT DETECTED Final   Giardia lamblia NOT DETECTED NOT DETECTED Final   Adenovirus F40/41 NOT DETECTED NOT DETECTED Final   Astrovirus NOT DETECTED NOT DETECTED Final   Norovirus GI/GII NOT DETECTED NOT DETECTED Final   Rotavirus A  NOT DETECTED NOT DETECTED Final   Sapovirus (I, II, IV, and V) NOT DETECTED NOT DETECTED Final     Studies: No results found.  Scheduled Meds: . Chlorhexidine Gluconate Cloth  6 each Topical Q0600  . folic acid  1 mg Oral Daily  . multivitamin with minerals  1 tablet Oral Daily  . mupirocin ointment  1 application Nasal BID  . nicotine  21 mg Transdermal Daily  . thiamine  100 mg Oral Daily    Continuous Infusions: . sodium chloride 75 mL/hr (09/01/16 1421)       Hillman Attig MD, PhD  [facility] Pager [phone]. If 7PM-7AM, please contact night-coverage at [email or web address], password [facility] 09/01/2016, 6:28 PM  LOS: 2 days

## 2016-09-02 DIAGNOSIS — F10231 Alcohol dependence with withdrawal delirium: Secondary | ICD-10-CM

## 2016-09-02 DIAGNOSIS — R569 Unspecified convulsions: Secondary | ICD-10-CM

## 2016-09-02 MED ORDER — HEPARIN SODIUM (PORCINE) 5000 UNIT/ML IJ SOLN
5000.0000 [IU] | Freq: Three times a day (TID) | INTRAMUSCULAR | Status: DC
  Administered 2016-09-02 – 2016-09-04 (×5): 5000 [IU] via SUBCUTANEOUS
  Filled 2016-09-02 (×4): qty 1
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
## 2016-09-02 NOTE — Progress Notes (Signed)
When I entered pts room at 0115 I could smell smoke in the room and on her. I reminded her that she could not smoke in her room or in bathroom. She stated she knew that. When I went into the BR it smelled heavily of air freshener.
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
## 2016-09-03 LAB — RAPID URINE DRUG SCREEN, HOSP PERFORMED
Amphetamines: NOT DETECTED
BARBITURATES: NOT DETECTED
Benzodiazepines: POSITIVE — AB
COCAINE: NOT DETECTED
OPIATES: NOT DETECTED
TETRAHYDROCANNABINOL: NOT DETECTED

## 2016-09-03 MED ORDER — LOPERAMIDE HCL 2 MG PO CAPS: 2.0000 mg | ORAL_CAPSULE | ORAL | Status: DC | PRN

## 2016-09-03 MED ORDER — ONDANSETRON 4 MG PO TBDP: 4.0000 mg | ORAL_TABLET | Freq: Four times a day (QID) | ORAL | Status: DC | PRN

## 2016-09-03 MED ORDER — ADULT MULTIVITAMIN W/MINERALS CH
1.0000 | ORAL_TABLET | Freq: Every day | ORAL | Status: DC
  Administered 2016-09-04: 1 via ORAL
  Filled 2016-09-03: qty 1

## 2016-09-03 MED ORDER — THIAMINE HCL 100 MG/ML IJ SOLN: 100.0000 mg | Freq: Once | INTRAMUSCULAR | Status: DC

## 2016-09-03 MED ORDER — LORAZEPAM 1 MG PO TABS
1.0000 mg | ORAL_TABLET | Freq: Four times a day (QID) | ORAL | Status: DC | PRN
  Administered 2016-09-03 – 2016-09-04 (×4): 1 mg via ORAL
  Filled 2016-09-03 (×5): qty 1

## 2016-09-03 MED ORDER — HYDROXYZINE HCL 25 MG PO TABS: 25.0000 mg | ORAL_TABLET | Freq: Four times a day (QID) | ORAL | Status: DC | PRN

## 2016-09-03 MED ORDER — VITAMIN B-1 100 MG PO TABS
100.0000 mg | ORAL_TABLET | Freq: Every day | ORAL | Status: DC
  Administered 2016-09-04: 100 mg via ORAL
  Filled 2016-09-03: qty 1

## 2016-09-03 NOTE — Progress Notes (Signed)
PROGRESS NOTE    Kristine Macias  TUU:[id number] DOB: June 09, 1986 DOA: 08/29/2016 PCP: Leonides Sake, MD     Brief Narrative:  Kristine Macias is a 31 y.o.WF PMHx Depression with anxiety, EtOH abuse, EtOH Proctitis,Alcoholic Hepatitis: Portal Vein Thrombosis, Anemia, Seizures or Hx EtOH withdrawal Seizures,Pneumothorax, spontaneous, tension, Chlamydia,Bacterial vaginosis     Presented with seizures states witnessed by her family she had had 2 seizures  Examination states she takes clonazepam for her seizure disorder. Patient states she stopped taking Klonapin  past 4 days because she was drinking in she did not want it to interact with alcohol. Examination states that she's been having cough nausea and malaise the past few days she describes seizure lasting for about 1 minute at the time of EMS arrival patient was lethargic but not postictal, ambulatory. Days her husband witnesses seizure. States several grand mal seizures. This is typical for her when she tries to stop drinking. No associated incontinence patient reports that she has our prior to seizing. Last alcoholic drink was on third of January shortly after midnight prior to this patient reports she's been drinking heavily for the past 4 days and try to stop today. Reports jitteriness muscle cramps and confusion.  Patient has been recently admitted in December wishing to undergo detox from alcohol. Regarding pertinent Chronic problems: In November patient had a C-section resulting in life birth. At the time of discharge she was discharged on Klonopin with follow-up with neurology and psychiatry  Subjective: Pt has anxiety and occasional tremors. No N/V/D, current HA, diaphoresis or dyspnea.  Assessment & Plan:  Active Problems:   Alcohol abuse   Tobacco use   Alcohol withdrawal (HCC)   Seizure due to alcohol withdrawal ([place])   Seizure ([place])  Alcohol withdrawal with suspected seizure:  - Continue clonazepam 0.65m BID -  Utilized ativan detox protocol > transition to po ativan CIWA with taper of frequency - Seizure precautions - Appreciate CSW assistance.  Alcoholism: Repeated unsuccessful attempts at cessation with history of seizures indicates psychological and physiological dependency. [facility]  - Suspect she will need inpatient rehabilitation for inception of definitive recovery.  - Concern for child endangerment with 3 children at home with pt in active alcoholism, but it seems her mother is also present and caretaker, had CPS case for this closed in June 2017 (confirmed by our CSW). No immediate danger is evident at this time. - Psychiatry consult initially placed, have discussed with provider and do not believe formal consult necessary at this time.  Elevated LFTs: Abdominal ultrasound 11/03/15 does not show liver cirrhosis.  - Resolved, follow up as outpatient.  Tobacco abuse: Patient suspected of smoking in her room (nursing staff smells smoke in the room) and patient refuses nicotine patch - Patient counseled on hospital protocol and absolute need to refrain from smoking. - Nicotine patch  DVT prophylaxis: Subcutaneous heparin Code Status: Full Family Communication: None at bedside on bedside rounds this AM Disposition Plan: Anticipate discharge home in next 24-48 hours  Consultants:  Social work  Objective: Vitals:   09/02/16 1458 09/02/16 2101 09/03/16 0616 09/03/16 1300  BP: 119/84 133/75 114/80 112/72  Pulse: 74 86 80 84  Resp: 18 15 14 16   Temp: 98.5 F (36.9 C) 98.3 F (36.8 C) 97.6 F (36.4 C) 98.3 F (36.8 C)  TempSrc: Oral Oral Oral Oral  SpO2: 96% 97% 98% 98%  Weight:  Height:        Intake/Output Summary (Last 24 hours) at 09/03/16 1848 Last data filed at 09/03/16 1800  Gross per 24 hour  Intake          3658.25 ml  Output             1250 ml  Net          2408.25  ml   Filed Weights   08/30/16 0700  Weight: 61.6 kg (135 lb 12.9 oz)    Examination:  General: Well-appearing 31 yo female in no distress. Eyes: negative scleral hemorrhage, negative anisocoria, negative icterus ENT: Negative Runny nose, negative gingival bleeding, Neck:  Negative scars, masses, torticollis, lymphadenopathy, JVD Lungs: Clear to auscultation bilaterally without wheezes or crackles Cardiovascular: Regular rate and rhythm without murmur gallop or rub normal S1 and S2 Abdomen: negative abdominal pain, nondistended, positive soft, bowel sounds, no rebound, no ascites, no appreciable mass Extremities: No significant cyanosis, clubbing, or edema bilateral lower extremities Skin: Negative rashes, lesions, ulcers Psychiatric:  Negative depression, negative anxiety, negative fatigue, negative mania  Central nervous system:  Cranial nerves II through XII intact. Appears calm without resting tremor.  Data Reviewed: Care during the described time interval was provided by me .  I have reviewed this patient's available data, including medical history, events of note, physical examination, and all test results as part of my evaluation. I have personally reviewed and interpreted all radiology studies.  CBC:  Recent Labs Lab 08/29/16 2102 08/31/16 0401 09/01/16 0405  WBC 8.3 7.3 6.0  NEUTROABS 4.5 3.9  --   HGB 14.1 12.2 11.6*  HCT 42.3 38.0 37.0  MCV 85.8 88.0 88.5  PLT 416* 283 681   Basic Metabolic Panel:  Recent Labs Lab 08/29/16 2102 08/30/16 0754 08/31/16 0401 08/31/16 1211 09/01/16 0405  NA 135  --  138 137 137  K 3.7  --  3.5 3.7 3.5  CL 100*  --  107 107 107  CO2 22  --  22 21* 22  GLUCOSE 112*  --  93 99 98  BUN 11  --  9 7 7   CREATININE 0.57  --  0.63 0.47 0.53  CALCIUM 8.7*  --  8.2* 8.2* 8.2*  MG  --  1.9 1.8  --  1.7  PHOS  --  7.4*  --   --   --    GFR: Estimated Creatinine Clearance: 91.7 mL/min (by C-G formula based on SCr of 0.53  mg/dL). Liver Function Tests:  Recent Labs Lab 08/29/16 2102 08/31/16 0401 08/31/16 1211 09/01/16 0405  AST 48* 49* 98* 64*  ALT 45 37 49 45  ALKPHOS 101 88 95 85  BILITOT 0.6 0.5 0.6 0.5  PROT 7.6 6.4* 7.0 6.6  ALBUMIN 4.1 3.3* 3.4* 3.4*    Recent Labs Lab 09/01/16 0405  LIPASE 25   No results for input(s): AMMONIA in the last 168 hours. Coagulation Profile: No results for input(s): INR, PROTIME in the last 168 hours. Cardiac Enzymes: No results for input(s): CKTOTAL, CKMB, CKMBINDEX, TROPONINI in the last 168 hours. BNP (last 3 results) No results for input(s): PROBNP in the last 8760 hours. HbA1C: No results for input(s): HGBA1C in the last 72 hours. CBG:  Recent Labs Lab 08/29/16 2045  GLUCAP 108*   Lipid Profile: No results for input(s): CHOL, HDL, LDLCALC, TRIG, CHOLHDL, LDLDIRECT in the last 72 hours. Thyroid Function Tests: No results for input(s): TSH, T4TOTAL, FREET4, T3FREE, THYROIDAB in the last  72 hours. Anemia Panel: No results for input(s): VITAMINB12, FOLATE, FERRITIN, TIBC, IRON, RETICCTPCT in the last 72 hours. Sepsis Labs: No results for input(s): PROCALCITON, LATICACIDVEN in the last 168 hours.  Recent Results (from the past 240 hour(s))  MRSA PCR Screening     Status: Abnormal   Collection Time: 08/30/16  6:57 AM  Result Value Ref Range Status   MRSA by PCR POSITIVE (A) NEGATIVE Final    Comment:        The GeneXpert MRSA Assay (FDA approved for NASAL specimens only), is one component of a comprehensive MRSA colonization surveillance program. It is not intended to diagnose MRSA infection nor to guide or monitor treatment for MRSA infections. RESULT CALLED TO, READ BACK BY AND VERIFIED WITH: MORGAN,M @ 1257 ON [id number] BY POTEAT,S   C difficile quick scan w PCR reflex     Status: None   Collection Time: 08/30/16 12:01 PM  Result Value Ref Range Status   C Diff antigen NEGATIVE NEGATIVE Final   C Diff toxin NEGATIVE NEGATIVE Final    C Diff interpretation No C. difficile detected.  Final  Gastrointestinal Panel by PCR , Stool     Status: None   Collection Time: 08/30/16 12:01 PM  Result Value Ref Range Status   Campylobacter species NOT DETECTED NOT DETECTED Final   Plesimonas shigelloides NOT DETECTED NOT DETECTED Final   Salmonella species NOT DETECTED NOT DETECTED Final   Yersinia enterocolitica NOT DETECTED NOT DETECTED Final   Vibrio species NOT DETECTED NOT DETECTED Final   Vibrio cholerae NOT DETECTED NOT DETECTED Final   Enteroaggregative E coli (EAEC) NOT DETECTED NOT DETECTED Final   Enteropathogenic E coli (EPEC) NOT DETECTED NOT DETECTED Final   Enterotoxigenic E coli (ETEC) NOT DETECTED NOT DETECTED Final   Shiga like toxin producing E coli (STEC) NOT DETECTED NOT DETECTED Final   Shigella/Enteroinvasive E coli (EIEC) NOT DETECTED NOT DETECTED Final   Cryptosporidium NOT DETECTED NOT DETECTED Final   Cyclospora cayetanensis NOT DETECTED NOT DETECTED Final   Entamoeba histolytica NOT DETECTED NOT DETECTED Final   Giardia lamblia NOT DETECTED NOT DETECTED Final   Adenovirus F40/41 NOT DETECTED NOT DETECTED Final   Astrovirus NOT DETECTED NOT DETECTED Final   Norovirus GI/GII NOT DETECTED NOT DETECTED Final   Rotavirus A NOT DETECTED NOT DETECTED Final   Sapovirus (I, II, IV, and V) NOT DETECTED NOT DETECTED Final    Radiology Studies: No results found.  Scheduled Meds: . Chlorhexidine Gluconate Cloth  6 each Topical Q0600  . clonazePAM  0.5 mg Oral BID  . heparin subcutaneous  5,000 Units Subcutaneous Q8H  . multivitamin with minerals  1 tablet Oral Daily  . mupirocin ointment  1 application Nasal BID  . nicotine  21 mg Transdermal Daily  . thiamine  100 mg Intramuscular Once  . [START ON 09/04/2016] thiamine  100 mg Oral Daily   Continuous Infusions: . sodium chloride 75 mL/hr (09/03/16 1518)     LOS: 4 days   Time spent: 25 min  Vance Gather, MD [facility] Pager [phone]    If 7PM-7AM, please contact night-coverage [email or web address] Password [email or web address] 09/03/2016, 6:48 PM

## 2016-09-04 NOTE — Discharge Summary (Addendum)
Physician Discharge Summary  Aviela Blundell JKD:[id number] DOB: 06/05/1986 DOA: 08/29/2016  PCP: Leonides Sake, MD  Admit date: 08/29/2016 Discharge date: 09/04/2016  Admitted From: Home Disposition: Home   Recommendations for Outpatient Follow-up:  1. Follow up with PCP in 1-2 weeks  2. Monitor for maintenance of recovery from alcoholism. Discharged to continue clonazepam 0.16m BID and completely abstain from alcohol. Will continue intensive outpatient program and remain on wait list for inpatient program. No prescriptions provided at discharge. 3. Monitor HR and BP. Transiently hypertensive, though not severely, and intermittently tachycardic in setting of alcohol detox. No medication changes made.  Home Health: None Equipment/Devices: None Discharge Condition: Stable CODE STATUS: Full Diet recommendation: Regular  Brief/Interim Summary: Kristine SalminenJohnstonis a 31y.o.WF PMHx depression with anxiety, EtOH abuse, alcoholic hepatitis: portal vein thrombosis, chronic anemia, and Hx EtOH withdrawal seizures who presented with 2 witnessed seizures reported by family members in the setting of coming off clonazepam and alcohol.   Patient states she stopped taking Klonopin past 4 days because she was drinking in she did not want it to interact with alcohol.She describes seizure, witnessed by her husband, lasting for about 1 minute at the time of EMS arrival patient was lethargic but not postictal,ambulatory. This is typical for her when she tries to stop drinking. No associated incontinence. Last alcoholic drink was on third of January shortly after midnight prior to this patient reports she's been drinking heavily for the past 4 days and tried to stop today.   Patient has been recently admitted in December wishing to undergo detox from alcohol. Regarding pertinent Chronic problems: In November patient had a C-section resulting in 32 life birth. At the time of discharge she was discharged on  Klonopin with follow-up with neurology and psychiatry. She has been in outpatient rehabilitation program. She was admitted for detoxification and has done so successfully, and is stable for discharge. See below for further details.  Discharge Diagnoses:  Active Problems:   Alcohol abuse   Tobacco use   Alcohol withdrawal (HCC)   Seiz[place] due to [place] withdrawal [place]   [place]  Alcohol withdrawal with suspected seizure:  - Continue clonazepam 0.528mBID - Utilized ativan detox protocol > transition to po ativan CIWA with taper of frequency. Has required a single prn dose of ativan 61m59mo today for CIWA 11 which included anxiety. Will discontinue this and discharge patient. - Seizure precautions: No seizure-like activity while inpatient.  Alcoholism: Repeated unsuccessful attempts at cessation with history of seizures indicates psychological and physiological dependency. [facility]  - Suspect she will need inpatient rehabilitation for inception of definitive recovery.  - Concern for child endangerment with 3 children at home with pt in active alcoholism, but it seems her mother is also present and caretaker, had CPS case for this closed in June 2017 (confirmed by our CSW). No immediate danger is evident at this time. - Psychiatry consult initially placed, have discussed with provider and do not believe formal consult necessary at this time.  Elevated LFTs: Abdominal ultrasound 11/03/15 does not show liver cirrhosis.  - Resolved, follow up as outpatient.  Tobacco abuse: Patient suspected of smoking in her room (nursing staff smells smoke in the room) and patient refuses nicotine patch - Patient counseled on hospital protocol and absolute need to refrain from smoking. - Nicotine patch  Discharge Instructions Discharge Instructions    Discharge instructions  Complete by:  As directed     You were admitted with alcohol withdrawal and were detoxed during the hospital stay. This has completed and you are stable for discharge with the following recommendations:  - Continue taking clonazepam 0.1m twice daily as you previously were - Totally abstain from alcohol, continue outpatient treatment.  - Return for care if your symptoms return.     Allergies as of 09/04/2016      Reactions   Aspirin Shortness Of Breath   Effexor [venlafaxine] Anaphylaxis   Gabapentin Anaphylaxis   Sulfa Antibiotics Anaphylaxis   Tetracyclines & Related Anaphylaxis   Trazodone And Nefazodone Anaphylaxis   Latex Hives, Itching   Paxil [paroxetine] Hives, Itching   Seroquel [quetiapine Fumarate] Hives, Itching   Zoloft [sertraline Hcl] Hives, Itching      Medication List    TAKE these medications   clonazePAM 0.5 MG tablet Commonly known as:  KLONOPIN Take 0.5 mg by mouth 2 (two) times daily.   ferrous sulfate 325 (65 FE) MG tablet Take 1 tablet (325 mg total) by mouth daily with breakfast.   folic acid 1 MG tablet Commonly known as:  FOLVITE Take 1 tablet (1 mg total) by mouth daily.   multivitamin with minerals Tabs tablet Take 1 tablet by mouth daily.   thiamine 100 MG tablet Take 1 tablet (100 mg total) by mouth daily.      Follow-up Information    HAMRICK,MAURA L, MD. Schedule an appointment as soon as possible for a visit in 1 week(s).   Specialty:  Family Medicine Contact information: [phone]         Allergies  Allergen Reactions  . Aspirin Shortness Of Breath  . Effexor [Venlafaxine] Anaphylaxis  . Gabapentin Anaphylaxis  . Sulfa Antibiotics Anaphylaxis  . Tetracyclines & Related Anaphylaxis  . Trazodone And Nefazodone Anaphylaxis  . Latex Hives and Itching  . Paxil [Paroxetine] Hives and Itching  . Seroquel [Quetiapine Fumarate] Hives and Itching  . Zoloft [Sertraline Hcl] Hives and Itching     Consultations:  Psychiatry by phone only  Procedures/Studies: No results found.  Subjective: Pt doing well, no complaints. Tremors resolved, somewhat anxious but wants to go home. No abd pain, N/V/D, or headache currently.   Discharge Exam: Vitals:   09/04/16 0538 09/04/16 1403  BP: 119/79 (!) 157/97  Pulse: 73 70  Resp: 16 16  Temp: 98.3 F (36.8 C) 98.3 F (36.8 C)   Vitals:   09/03/16 2159 09/04/16 0027 09/04/16 0538 09/04/16 1403  BP: (!) 157/94 127/88 119/79 (!) 157/97  Pulse: 65  73 70  Resp: 16  16 16   Temp: 98.6 F (37 C)  98.3 F (36.8 C) 98.3 F (36.8 C)  TempSrc: Oral  Oral Oral  SpO2: 98%  98% 95%  Weight:      Height:       General: Pt is alert, awake, not in acute distress Cardiovascular: RRR, S1/S2 +, no rubs, no gallops Respiratory: CTA bilaterally, no wheezing, no rhonchi Abdominal: Soft, NT, ND, bowel sounds + Extremities: no edema, no cyanosis  The results of significant diagnostics from this hospitalization (including imaging, microbiology, ancillary and laboratory) are listed below for reference.    Microbiology: Recent Results (from the past 240 hour(s))  MRSA PCR Screening     Status: Abnormal   Collection Time: 08/30/16  6:57 AM  Result Value Ref Range Status   MRSA by PCR POSITIVE (A) NEGATIVE Final  Comment:        The GeneXpert MRSA Assay (FDA approved for NASAL specimens only), is one component of a comprehensive MRSA colonization surveillance program. It is not intended to diagnose MRSA infection nor to guide or monitor treatment for MRSA infections. RESULT CALLED TO, READ BACK BY AND VERIFIED WITH: MORGAN,M @ 1257 ON [id number] BY POTEAT,S   C difficile quick scan w PCR reflex     Status: None   Collection Time: 08/30/16 12:01 PM  Result Value Ref Range Status   C Diff antigen NEGATIVE NEGATIVE Final   C Diff toxin NEGATIVE NEGATIVE Final   C Diff interpretation No C. difficile detected.  Final  Gastrointestinal  Panel by PCR , Stool     Status: None   Collection Time: 08/30/16 12:01 PM  Result Value Ref Range Status   Campylobacter species NOT DETECTED NOT DETECTED Final   Plesimonas shigelloides NOT DETECTED NOT DETECTED Final   Salmonella species NOT DETECTED NOT DETECTED Final   Yersinia enterocolitica NOT DETECTED NOT DETECTED Final   Vibrio species NOT DETECTED NOT DETECTED Final   Vibrio cholerae NOT DETECTED NOT DETECTED Final   Enteroaggregative E coli (EAEC) NOT DETECTED NOT DETECTED Final   Enteropathogenic E coli (EPEC) NOT DETECTED NOT DETECTED Final   Enterotoxigenic E coli (ETEC) NOT DETECTED NOT DETECTED Final   Shiga like toxin producing E coli (STEC) NOT DETECTED NOT DETECTED Final   Shigella/Enteroinvasive E coli (EIEC) NOT DETECTED NOT DETECTED Final   Cryptosporidium NOT DETECTED NOT DETECTED Final   Cyclospora cayetanensis NOT DETECTED NOT DETECTED Final   Entamoeba histolytica NOT DETECTED NOT DETECTED Final   Giardia lamblia NOT DETECTED NOT DETECTED Final   Adenovirus F40/41 NOT DETECTED NOT DETECTED Final   Astrovirus NOT DETECTED NOT DETECTED Final   Norovirus GI/GII NOT DETECTED NOT DETECTED Final   Rotavirus A NOT DETECTED NOT DETECTED Final   Sapovirus (I, II, IV, and V) NOT DETECTED NOT DETECTED Final     Labs: BNP (last 3 results)  Recent Labs  10/04/15 2021  BNP 751.7*   Basic Metabolic Panel:  Recent Labs Lab 08/29/16 2102 08/30/16 0754 08/31/16 0401 08/31/16 1211 09/01/16 0405  NA 135  --  138 137 137  K 3.7  --  3.5 3.7 3.5  CL 100*  --  107 107 107  CO2 22  --  22 21* 22  GLUCOSE 112*  --  93 99 98  BUN 11  --  9 7 7   CREATININE 0.57  --  0.63 0.47 0.53  CALCIUM 8.7*  --  8.2* 8.2* 8.2*  MG  --  1.9 1.8  --  1.7  PHOS  --  7.4*  --   --   --    Liver Function Tests:  Recent Labs Lab 08/29/16 2102 08/31/16 0401 08/31/16 1211 09/01/16 0405  AST 48* 49* 98* 64*  ALT 45 37 49 45  ALKPHOS 101 88 95 85  BILITOT 0.6 0.5 0.6 0.5   PROT 7.6 6.4* 7.0 6.6  ALBUMIN 4.1 3.3* 3.4* 3.4*    Recent Labs Lab 09/01/16 0405  LIPASE 25   No results for input(s): AMMONIA in the last 168 hours. CBC:  Recent Labs Lab 08/29/16 2102 08/31/16 0401 09/01/16 0405  WBC 8.3 7.3 6.0  NEUTROABS 4.5 3.9  --   HGB 14.1 12.2 11.6*  HCT 42.3 38.0 37.0  MCV 85.8 88.0 88.5  PLT 416* 283 227   Cardiac Enzymes: No results for  input(s): CKTOTAL, CKMB, CKMBINDEX, TROPONINI in the last 168 hours. BNP: Invalid input(s): POCBNP CBG:  Recent Labs Lab 08/29/16 2045  GLUCAP 108*   D-Dimer No results for input(s): DDIMER in the last 72 hours. Hgb A1c No results for input(s): HGBA1C in the last 72 hours. Lipid Profile No results for input(s): CHOL, HDL, LDLCALC, TRIG, CHOLHDL, LDLDIRECT in the last 72 hours. Thyroid function studies No results for input(s): TSH, T4TOTAL, T3FREE, THYROIDAB in the last 72 hours.  Invalid input(s): FREET3 Anemia work up No results for input(s): VITAMINB12, FOLATE, FERRITIN, TIBC, IRON, RETICCTPCT in the last 72 hours. Urinalysis    Component Value Date/Time   COLORURINE YELLOW 08/29/2016 2050   APPEARANCEUR CLEAR 08/29/2016 2050   APPEARANCEUR Clear 02/15/2016 1456   LABSPEC 1.006 08/29/2016 2050   LABSPEC 1.006 12/20/2014 1018   PHURINE 9.0 (H) 08/29/2016 2050   GLUCOSEU NEGATIVE 08/29/2016 2050   GLUCOSEU Negative 12/20/2014 1018   HGBUR LARGE (A) 08/29/2016 2050   BILIRUBINUR NEGATIVE 08/29/2016 2050   BILIRUBINUR neg 03/13/2016 1700   BILIRUBINUR Negative 02/15/2016 1456   BILIRUBINUR Negative 12/20/2014 1018   [place] 08/29/2016 2050   PROTEINUR NEGATIVE 08/29/2016 2050   UROBILINOGEN negative 03/13/2016 1700   UROBILINOGEN 0.2 06/13/2012 2306   NITRITE NEGATIVE 08/29/2016 2050   LEUKOCYTESUR SMALL (A) 08/29/2016 2050   LEUKOCYTESUR Negative 02/15/2016 1456   LEUKOCYTESUR Negative 12/20/2014 1018   Sepsis Labs Invalid input(s): PROCALCITONIN,  WBC,   LACTICIDVEN Microbiology Recent Results (from the past 240 hour(s))  MRSA PCR Screening     Status: Abnormal   Collection Time: 08/30/16  6:57 AM  Result Value Ref Range Status   MRSA by PCR POSITIVE (A) NEGATIVE Final    Comment:        The GeneXpert MRSA Assay (FDA approved for NASAL specimens only), is one component of a comprehensive MRSA colonization surveillance program. It is not intended to diagnose MRSA infection nor to guide or monitor treatment for MRSA infections. RESULT CALLED TO, READ BACK BY AND VERIFIED WITH: MORGAN,M @ 1257 ON [id number] BY POTEAT,S   C difficile quick scan w PCR reflex     Status: None   Collection Time: 08/30/16 12:01 PM  Result Value Ref Range Status   C Diff antigen NEGATIVE NEGATIVE Final   C Diff toxin NEGATIVE NEGATIVE Final   C Diff interpretation No C. difficile detected.  Final  Gastrointestinal Panel by PCR , Stool     Status: None   Collection Time: 08/30/16 12:01 PM  Result Value Ref Range Status   Campylobacter species NOT DETECTED NOT DETECTED Final   Plesimonas shigelloides NOT DETECTED NOT DETECTED Final   Salmonella species NOT DETECTED NOT DETECTED Final   Yersinia enterocolitica NOT DETECTED NOT DETECTED Final   Vibrio species NOT DETECTED NOT DETECTED Final   Vibrio cholerae NOT DETECTED NOT DETECTED Final   Enteroaggregative E coli (EAEC) NOT DETECTED NOT DETECTED Final   Enteropathogenic E coli (EPEC) NOT DETECTED NOT DETECTED Final   Enterotoxigenic E coli (ETEC) NOT DETECTED NOT DETECTED Final   Shiga like toxin producing E coli (STEC) NOT DETECTED NOT DETECTED Final   Shigella/Enteroinvasive E coli (EIEC) NOT DETECTED NOT DETECTED Final   Cryptosporidium NOT DETECTED NOT DETECTED Final   Cyclospora cayetanensis NOT DETECTED NOT DETECTED Final   Entamoeba histolytica NOT DETECTED NOT DETECTED Final   Giardia lamblia NOT DETECTED NOT DETECTED Final   Adenovirus F40/41 NOT DETECTED NOT DETECTED Final   Astrovirus NOT  DETECTED  NOT DETECTED Final   Norovirus GI/GII NOT DETECTED NOT DETECTED Final   Rotavirus A NOT DETECTED NOT DETECTED Final   Sapovirus (I, II, IV, and V) NOT DETECTED NOT DETECTED Final    Time coordinating discharge: Over 30 minutes  Vance Gather, MD  [facility] 09/04/2016, 2:46 PM Pager [phone]

## 2016-09-20 ENCOUNTER — Emergency Department (HOSPITAL_COMMUNITY): Payer: Medicaid Other

## 2016-09-20 ENCOUNTER — Encounter (HOSPITAL_COMMUNITY): Payer: Self-pay | Admitting: Emergency Medicine

## 2016-09-20 ENCOUNTER — Emergency Department
Admission: EM | Admit: 2016-09-20 | Discharge: 2016-09-20 | Disposition: A | Discharge status: Home / Self Care | Payer: Medicaid Other | Attending: Emergency Medicine | Admitting: Emergency Medicine

## 2016-09-20 ENCOUNTER — Encounter: Payer: Self-pay | Admitting: Emergency Medicine

## 2016-09-20 DIAGNOSIS — F411 Generalized anxiety disorder: Secondary | ICD-10-CM | POA: Diagnosis present

## 2016-09-20 DIAGNOSIS — F17210 Nicotine dependence, cigarettes, uncomplicated: Secondary | ICD-10-CM | POA: Insufficient documentation

## 2016-09-20 DIAGNOSIS — Z853 Personal history of malignant neoplasm of breast: Secondary | ICD-10-CM | POA: Diagnosis not present

## 2016-09-20 DIAGNOSIS — F10230 Alcohol dependence with withdrawal, uncomplicated: Secondary | ICD-10-CM

## 2016-09-20 DIAGNOSIS — F10231 Alcohol dependence with withdrawal delirium: Secondary | ICD-10-CM | POA: Insufficient documentation

## 2016-09-20 DIAGNOSIS — Z79899 Other long term (current) drug therapy: Secondary | ICD-10-CM | POA: Insufficient documentation

## 2016-09-20 DIAGNOSIS — E860 Dehydration: Secondary | ICD-10-CM | POA: Diagnosis present

## 2016-09-20 DIAGNOSIS — R569 Unspecified convulsions: Secondary | ICD-10-CM | POA: Diagnosis present

## 2016-09-20 DIAGNOSIS — H9192 Unspecified hearing loss, left ear: Secondary | ICD-10-CM | POA: Diagnosis present

## 2016-09-20 DIAGNOSIS — F10239 Alcohol dependence with withdrawal, unspecified: Principal | ICD-10-CM | POA: Diagnosis present

## 2016-09-20 DIAGNOSIS — G40409 Other generalized epilepsy and epileptic syndromes, not intractable, without status epilepticus: Secondary | ICD-10-CM | POA: Diagnosis present

## 2016-09-20 LAB — CBC WITH DIFFERENTIAL/PLATELET
BASOS ABS: 0 10*3/uL (ref 0–0.1)
BASOS ABS: 0.1 10*3/uL (ref 0.0–0.1)
Basophils Relative: 1 %
Basophils Relative: 1 %
EOS PCT: 2 %
Eosinophils Absolute: 0.1 10*3/uL (ref 0–0.7)
Eosinophils Absolute: 0.3 10*3/uL (ref 0.0–0.7)
Eosinophils Relative: 2 %
HCT: 44.5 % (ref 36.0–46.0)
HEMATOCRIT: 39.1 % (ref 35.0–47.0)
HEMOGLOBIN: 12.7 g/dL (ref 12.0–16.0)
Hemoglobin: 14.8 g/dL (ref 12.0–15.0)
LYMPHS ABS: 3.1 10*3/uL (ref 0.7–4.0)
LYMPHS PCT: 23 %
LYMPHS PCT: 29 %
Lymphs Abs: 1.9 10*3/uL (ref 1.0–3.6)
MCH: 27.8 pg (ref 26.0–34.0)
MCH: 28.6 pg (ref 26.0–34.0)
MCHC: 32.4 g/dL (ref 32.0–36.0)
MCHC: 33.3 g/dL (ref 30.0–36.0)
MCV: 85.6 fL (ref 80.0–100.0)
MCV: 85.9 fL (ref 78.0–100.0)
MONO ABS: 0.6 10*3/uL (ref 0.2–0.9)
MONO ABS: 0.9 10*3/uL (ref 0.1–1.0)
Monocytes Relative: 7 %
Monocytes Relative: 9 %
NEUTROS ABS: 5.7 10*3/uL (ref 1.4–6.5)
NEUTROS ABS: 6.2 10*3/uL (ref 1.7–7.7)
NEUTROS PCT: 67 %
Neutrophils Relative %: 59 %
Platelets: 402 10*3/uL (ref 150–440)
Platelets: 521 10*3/uL — ABNORMAL HIGH (ref 150–400)
RBC: 4.57 MIL/uL (ref 3.80–5.20)
RBC: 5.18 MIL/uL — AB (ref 3.87–5.11)
RDW: 17.2 % — ABNORMAL HIGH (ref 11.5–14.5)
RDW: 16.4 % — AB (ref 11.5–15.5)
WBC: 8.5 10*3/uL (ref 3.6–11.0)
WBC: 10.5 10*3/uL (ref 4.0–10.5)

## 2016-09-20 LAB — COMPREHENSIVE METABOLIC PANEL
ALBUMIN: 3.4 g/dL — AB (ref 3.5–5.0)
ALK PHOS: 89 U/L (ref 38–126)
ALT: 52 U/L (ref 14–54)
ALT: 60 U/L — AB (ref 14–54)
ANION GAP: 10 (ref 5–15)
AST: 61 U/L — ABNORMAL HIGH (ref 15–41)
AST: 55 U/L — AB (ref 15–41)
Albumin: 3.9 g/dL (ref 3.5–5.0)
Alkaline Phosphatase: 102 U/L (ref 38–126)
Anion gap: 15 (ref 5–15)
BILIRUBIN TOTAL: 0.5 mg/dL (ref 0.3–1.2)
BUN: 17 mg/dL (ref 6–20)
BUN: 15 mg/dL (ref 6–20)
CALCIUM: 7.3 mg/dL — AB (ref 8.9–10.3)
CHLORIDE: 101 mmol/L (ref 101–111)
CO2: 24 mmol/L (ref 22–32)
CO2: 25 mmol/L (ref 22–32)
CREATININE: 0.65 mg/dL (ref 0.44–1.00)
Calcium: 8.8 mg/dL — ABNORMAL LOW (ref 8.9–10.3)
Chloride: 96 mmol/L — ABNORMAL LOW (ref 101–111)
Creatinine, Ser: 0.6 mg/dL (ref 0.44–1.00)
GFR calc Af Amer: >60 mL/min (ref >60)
GFR calc non Af Amer: >60 mL/min (ref >60)
GLUCOSE: 88 mg/dL (ref 65–99)
Glucose, Bld: 100 mg/dL — ABNORMAL HIGH (ref 65–99)
Potassium: 3.3 mmol/L — ABNORMAL LOW (ref 3.5–5.1)
Potassium: 3.3 mmol/L — ABNORMAL LOW (ref 3.5–5.1)
SODIUM: 135 mmol/L (ref 135–145)
Sodium: 136 mmol/L (ref 135–145)
TOTAL PROTEIN: 7.6 g/dL (ref 6.5–8.1)
Total Bilirubin: 0.6 mg/dL (ref 0.3–1.2)
Total Protein: 7 g/dL (ref 6.5–8.1)

## 2016-09-20 LAB — URINALYSIS, ROUTINE W REFLEX MICROSCOPIC
Bilirubin Urine: NEGATIVE
GLUCOSE, UA: NEGATIVE mg/dL
Hgb urine dipstick: NEGATIVE
Ketones, ur: 5 mg/dL — AB
Leukocytes, UA: NEGATIVE
NITRITE: NEGATIVE
PH: 7 (ref 5.0–8.0)
PROTEIN: 30 mg/dL — AB
SPECIFIC GRAVITY, URINE: 1.025 (ref 1.005–1.030)

## 2016-09-20 LAB — SALICYLATE LEVEL

## 2016-09-20 LAB — ETHANOL: Alcohol, Ethyl (B): 76 mg/dL — ABNORMAL HIGH (ref <5)

## 2016-09-20 LAB — PREGNANCY, URINE
Preg Test, Ur: NEGATIVE
Preg Test, Ur: NEGATIVE

## 2016-09-20 LAB — ACETAMINOPHEN LEVEL: Acetaminophen (Tylenol), Serum: <10 ug/mL — ABNORMAL LOW (ref 10–30)

## 2016-09-20 MED ORDER — LORAZEPAM 2 MG/ML IJ SOLN
1.0000 mg | Freq: Once | INTRAMUSCULAR | Status: AC
  Administered 2016-09-20: 1 mg via INTRAVENOUS
  Filled 2016-09-20: qty 1

## 2016-09-20 NOTE — BH Assessment (Signed)
Assessment Note  Kristine Macias is an 31 y.o. female who presents to the ER due to alcohol use. She states she was trying to detox on her own, while at home. It resulted in there having two seizures. She further reports, she drinks approximately 7 to 9 days out of the month. It's in the amount of 12, 24oz beers.  She denies SI/HI and AV/H.  She currently receives outpatient treatment from [facility].  When asked what she wanted to happen from this ER visit, she stated, "get detox." Writer then asked if she was looking for inpatient or outpatient, she stated inpatient. Writer explained her options for facilities, as well as [facility] no longer provide sole detox if they did not meet medical criteria for admission. Patient then stated, she did not want to go anywhere but whether return home. Writer again reviewed options and she said she wanted to go home.  Writer discussed patient with ER MD (Dr. Cinda Quest) and patient is able to discharge home when medically cleared. Patient to follow up with current outpatient provider.  Patient denies SI/HI and AV/H.  Diagnosis: Alcohol Treatment  Past Medical History:  Past Medical History:  Diagnosis Date  . Alcohol abuse 11/2014   drinking since age 35. chronic, recurrent. at least 2 detox admits before 2015.   Marland Kitchen Alcoholic hepatitis 08/4780   hepatic steatosis on 11/2014 ultrasound.   . Breast cancer [facility])    age 58, lump removed from left breast  . Chlamydia 2007   bacterial vaginosis 09/2011  . Colitis 12/2014   colonoscopy for diarrhea and abnml CT 01/06/15: erythmatous TI (path: active ileitis, ? emerging IBD), rectal erythema (path: mucosal prolapse). Random bx of normal colon (path unremarkable)   . Congenital deafness    left ear only.  Also noted in mother and sibling (unilateral).   . Depression with anxiety initally at age 25  . Failure to thrive in adult 12/2014.    Malnutrition: n/v, not eating, weight loss, BMI 14.  s/p  01/11/2015 PEG (Dr Dorna Leitz).   . Irregular heart beat 2010   wt/diet related after evaluation  . Pancreas divisum 02/2015   Type 1 seen on CT.   Marland Kitchen Pneumothorax, spontaneous, tension   . Renal disorder   . Seizure ([employer]) 06/2015   due to ETOH/benzo withdrawal.   . Spontaneous pneumothorax 08/2012   right.  chest tube placed.   . Thrombocytopenia ([place]) 04/2007    Past Surgical History:  Procedure Laterality Date  . CESAREAN SECTION  07/2009    x 1.  G3, Para 2012.   . CHEST TUBE INSERTION    . COLONOSCOPY WITH PROPOFOL N/A 01/06/2015   [facility], Dr Rayann Heman. colonoscopy for diarrhea and abnml CT 01/06/15: erythmatous TI (path: active ileitis, ? emerging IBD), rectal erythema (path: mucosal prolapse). Random bx of normal colon (path unremarkable)   . ESOPHAGOGASTRODUODENOSCOPY N/A 01/06/2015   ;[facility], Dr Rayann Heman. For wt loss, N/V: Normal study, duodenal biopsy/pathology: chronic active duodenitis.   Marland Kitchen ESOPHAGOGASTRODUODENOSCOPY N/A 01/11/2015   Rein-normal with PEG placement  . PEG PLACEMENT N/A 01/11/2015   [facility], Dr Rayann Heman.  for N/V/wt loss/severe malnutrition.     Family History:  Family History  Problem Relation Age of Onset  . Thyroid disease Mother   . Cancer Mother     breast cancer  . Cancer Father     lung cancer  . Stroke Other   . Crohn's disease Brother   . Cancer Maternal Grandmother  breast cancer  . Anesthesia problems Neg Hx     Social History:  reports that she has been smoking Cigarettes.  She has been smoking about 2.00 packs per day. She has never used smokeless tobacco. She reports that she drinks about 21.6 oz of alcohol per week . She reports that she does not use drugs.  Additional Social History:  Alcohol / Drug Use Pain Medications: See PTA Prescriptions: See PTA Over the Counter: See PTA History of alcohol / drug use?: Yes Longest period of sobriety (when/how long): Nine Months Negative Consequences of Use: Personal relationships, Financial, Work /  School Withdrawal Symptoms: Tremors, Nausea / Vomiting, Sweats Substance #1 Name of Substance 1: Alcohol 1 - Age of First Use: 27 1 - Amount (size/oz): 12, 24oz Beer 1 - Frequency: Binge Drinking, 7 to 9 days out of the month 1 - Duration: "Since I was 27 years." 1 - Last Use / Amount: 09/20/2016  CIWA: CIWA-Ar BP: 114/78 Pulse Rate: 96 COWS:    Allergies:  Allergies  Allergen Reactions  . Aspirin Shortness Of Breath  . Effexor [Venlafaxine] Anaphylaxis  . Gabapentin Anaphylaxis  . Sulfa Antibiotics Anaphylaxis  . Tetracyclines & Related Anaphylaxis  . Trazodone And Nefazodone Anaphylaxis  . Latex Hives and Itching  . Paxil [Paroxetine] Hives and Itching  . Seroquel [Quetiapine Fumarate] Hives and Itching  . Zoloft [Sertraline Hcl] Hives and Itching    Home Medications:  (Not in a hospital admission)  OB/GYN Status:  Patient's last menstrual period was 08/08/2015.  General Assessment Data Location of Assessment: [facility] ED TTS Assessment: In system Is this a Tele or Face-to-Face Assessment?: Face-to-Face Is this an Initial Assessment or a Re-assessment for this encounter?: Initial Assessment Marital status: Married Lueders name: n/a Is patient pregnant?: No Pregnancy Status: No Living Arrangements: Parent, Children, Spouse/significant other Can pt return to current living arrangement?: Yes Admission Status: Voluntary Is patient capable of signing voluntary admission?: Yes Referral Source: Self/Family/Friend Insurance type: [employer] Screening Exam ([place]) Medical Exam completed: Yes  Crisis Care Plan Living Arrangements: Parent, Children, Spouse/significant other Legal Guardian: Other: (Self) Name of Psychiatrist: [facility] Name of Therapist: [employer] Status Is patient currently in school?: No Current Grade: n/a Highest grade of school patient has completed: Secretary/administrator Name of school:  n/a Sport and exercise psychologist person: n/a  Risk to self with the past 6 months Suicidal Ideation: No Has patient been a risk to self within the past 6 months prior to admission? : No Suicidal Intent: No Has patient had any suicidal intent within the past 6 months prior to admission? : No Is patient at risk for suicide?: No Suicidal Plan?: No Has patient had any suicidal plan within the past 6 months prior to admission? : No Access to Means: No What has been your use of drugs/alcohol within the last 12 months?: Alcohol Previous Attempts/Gestures: No How many times?: 0 Other Self Harm Risks: Active Addiction Triggers for Past Attempts: Unknown Intentional Self Injurious Behavior: None Family Suicide History: No Recent stressful life event(s): Other (Comment) (Grandmother passed three days ago.) Persecutory voices/beliefs?: No Depression: Yes Depression Symptoms: Feeling worthless/self pity, Loss of interest in usual pleasures, Guilt, Fatigue, Isolating, Tearfulness Substance abuse history and/or treatment for substance abuse?: Yes Suicide prevention information given to non-admitted patients: Not applicable  Risk to Others within the past 6 months Homicidal Ideation: No Does patient have any lifetime risk of violence toward others beyond the six months prior to  admission? : No Thoughts of Harm to Others: No Current Homicidal Intent: No Current Homicidal Plan: No Access to Homicidal Means: No Identified Victim: Reports of none History of harm to others?: No Assessment of Violence: None Noted Violent Behavior Description: Reports of none Does patient have access to weapons?: No Criminal Charges Pending?: No Does patient have a court date: No Is patient on probation?: No  Psychosis Hallucinations: None noted Delusions: None noted  Mental Status Report Appearance/Hygiene: Unremarkable Eye Contact: Good Motor Activity: Unable to assess Speech: Logical/coherent, Soft Level of Consciousness:  Alert Mood: Depressed, Anxious, Guilty, Sad Affect: Appropriate to circumstance, Depressed, Sad Anxiety Level: Minimal Thought Processes: Coherent, Relevant Judgement: Partial Orientation: Person, Place, Situation, Time, Appropriate for developmental age Obsessive Compulsive Thoughts/Behaviors: None  Cognitive Functioning Concentration: Normal Memory: Recent Intact, Remote Intact IQ: Average Insight: Fair Impulse Control: Poor Appetite: Fair Weight Loss:  (Gave birth approximately two months ago.) Weight Gain: 0 Sleep: Decreased Total Hours of Sleep:  (Due to new born not sleeping well.) Vegetative Symptoms: None  ADLScreening [facility] Assessment Services) Patient's cognitive ability adequate to safely complete daily activities?: Yes Patient able to express need for assistance with ADLs?: Yes Independently performs ADLs?: Yes (appropriate for developmental age)  Prior Inpatient Therapy Prior Inpatient Therapy: Yes Prior Therapy Dates: 2014 Prior Therapy Facilty/Provider(s): [facility] Reason for Treatment: Alcohol Treatment  Prior Outpatient Therapy Prior Outpatient Therapy: Yes Prior Therapy Dates: Currently Prior Therapy Facilty/Provider(s): [facility]  Reason for Treatment: Alcohol Treatment Does patient have an ACCT team?: No Does patient have Intensive In-House Services?  : No Does patient have [facility] services? : No Does patient have P4CC services?: No  ADL Screening (condition at time of admission) Patient's cognitive ability adequate to safely complete daily activities?: Yes Is the patient deaf or have difficulty hearing?: No Does the patient have difficulty seeing, even when wearing glasses/contacts?: No Does the patient have difficulty concentrating, remembering, or making decisions?: No Patient able to express need for assistance with ADLs?: Yes Does the patient have difficulty dressing or bathing?: No Independently performs ADLs?: Yes  (appropriate for developmental age) Does the patient have difficulty walking or climbing stairs?: No Weakness of Legs: None Weakness of Arms/Hands: None  Home Assistive Devices/Equipment Home Assistive Devices/Equipment: None  Therapy Consults (therapy consults require a physician order) PT Evaluation Needed: No OT Evalulation Needed: No SLP Evaluation Needed: No Abuse/Neglect Assessment (Assessment to be complete while patient is alone) Physical Abuse: Denies Verbal Abuse: Denies Sexual Abuse: Denies Exploitation of patient/patient's resources: Denies Self-Neglect: Denies Values / Beliefs Cultural Requests During Hospitalization: None Spiritual Requests During Hospitalization: None Consults Spiritual Care Consult Needed: No Social Work Consult Needed: No Regulatory affairs officer (For Healthcare) Does Patient Have a Medical Advance Directive?: No Would patient like information on creating a medical advance directive?: No - Patient declined    Additional Information 1:1 In Past 12 Months?: No CIRT Risk: No Elopement Risk: No Does patient have medical clearance?: Yes  Child/Adolescent Assessment Running Away Risk: Denies (Patient is an adult)  Disposition:  Disposition Initial Assessment Completed for this Encounter: Yes Disposition of Patient: Treatment offered and refused  On Site Evaluation by:   Reviewed with Physician:    Gunnar Fusi MS, LCAS, [place], [place], CCSI Therapeutic Triage Specialist 09/20/2016 4:42 PM
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
## 2016-09-20 NOTE — Discharge Instructions (Signed)
Lee's return if she decides she wants to detox. Do not stop drinking alcohol suddenly as you can have more withdrawal seizures. Please do not breast feed and when you're drinking alcohol.

## 2016-09-20 NOTE — ED Notes (Signed)
Calvin at the bedside.

## 2016-09-20 NOTE — ED Triage Notes (Signed)
Pt to ed via ems from home with reports of having two seizures this am. Pt normally has seizures when she is detoxing from alcohol. Pt normally drinks a six pack per day, last drink was this am but she did not keep it down. Pt states she has been drinking a six pack a day for two years. Ems reports vss.

## 2016-09-20 NOTE — ED Triage Notes (Addendum)
Pt to ED ref. Having a seizure.  Pt st's she has withdrawal seizures when she is not drinking.  Pt was seen at [facility] earlier today for same and discharged.  Pt also c/o productive cough, nausea and vomiting. Pt also c/o vaginal discharge

## 2016-09-21 ENCOUNTER — Encounter (HOSPITAL_COMMUNITY): Payer: Self-pay | Admitting: Internal Medicine

## 2016-09-21 ENCOUNTER — Observation Stay (HOSPITAL_COMMUNITY): Payer: Medicaid Other

## 2016-09-21 ENCOUNTER — Inpatient Hospital Stay (HOSPITAL_COMMUNITY)
Admission: EM | Admit: 2016-09-21 | Discharge: 2016-09-25 | DRG: 897 | Disposition: A | Discharge status: Home / Self Care | Payer: Medicaid Other | Attending: Internal Medicine | Admitting: Internal Medicine

## 2016-09-21 ENCOUNTER — Emergency Department (HOSPITAL_COMMUNITY): Payer: Medicaid Other

## 2016-09-21 DIAGNOSIS — F10230 Alcohol dependence with withdrawal, uncomplicated: Secondary | ICD-10-CM

## 2016-09-21 DIAGNOSIS — F10939 Alcohol use, unspecified with withdrawal, unspecified: Secondary | ICD-10-CM

## 2016-09-21 DIAGNOSIS — E860 Dehydration: Secondary | ICD-10-CM

## 2016-09-21 DIAGNOSIS — Z720 Tobacco use: Secondary | ICD-10-CM | POA: Diagnosis present

## 2016-09-21 DIAGNOSIS — R740 Nonspecific elevation of levels of transaminase and lactic acid dehydrogenase [LDH]: Secondary | ICD-10-CM

## 2016-09-21 DIAGNOSIS — R7401 Elevation of levels of liver transaminase levels: Secondary | ICD-10-CM

## 2016-09-21 DIAGNOSIS — F1010 Alcohol abuse, uncomplicated: Secondary | ICD-10-CM | POA: Diagnosis present

## 2016-09-21 DIAGNOSIS — R569 Unspecified convulsions: Secondary | ICD-10-CM

## 2016-09-21 DIAGNOSIS — F411 Generalized anxiety disorder: Secondary | ICD-10-CM | POA: Diagnosis present

## 2016-09-21 DIAGNOSIS — F10239 Alcohol dependence with withdrawal, unspecified: Secondary | ICD-10-CM

## 2016-09-21 DIAGNOSIS — F10930 Alcohol use, unspecified with withdrawal, uncomplicated: Secondary | ICD-10-CM

## 2016-09-21 LAB — CBC WITH DIFFERENTIAL/PLATELET
Basophils Absolute: 0 10*3/uL (ref 0.0–0.1)
Basophils Relative: 0 %
EOS PCT: 4 %
Eosinophils Absolute: 0.4 10*3/uL (ref 0.0–0.7)
HCT: 41.6 % (ref 36.0–46.0)
Hemoglobin: 13.4 g/dL (ref 12.0–15.0)
LYMPHS ABS: 2.9 10*3/uL (ref 0.7–4.0)
LYMPHS PCT: 26 %
MCH: 27.9 pg (ref 26.0–34.0)
MCHC: 32.2 g/dL (ref 30.0–36.0)
MCV: 86.7 fL (ref 78.0–100.0)
MONO ABS: 1.1 10*3/uL — AB (ref 0.1–1.0)
Monocytes Relative: 9 %
Neutro Abs: 6.8 10*3/uL (ref 1.7–7.7)
Neutrophils Relative %: 61 %
PLATELETS: 463 10*3/uL — AB (ref 150–400)
RBC: 4.8 MIL/uL (ref 3.87–5.11)
RDW: 16.4 % — AB (ref 11.5–15.5)
WBC: 11.2 10*3/uL — ABNORMAL HIGH (ref 4.0–10.5)

## 2016-09-21 LAB — RAPID URINE DRUG SCREEN, HOSP PERFORMED
Amphetamines: NOT DETECTED
BARBITURATES: NOT DETECTED
BENZODIAZEPINES: POSITIVE — AB
COCAINE: NOT DETECTED
OPIATES: NOT DETECTED
Tetrahydrocannabinol: NOT DETECTED

## 2016-09-21 LAB — HEPATIC FUNCTION PANEL
ALT: 51 U/L (ref 14–54)
AST: 53 U/L — ABNORMAL HIGH (ref 15–41)
Albumin: 3.4 g/dL — ABNORMAL LOW (ref 3.5–5.0)
Alkaline Phosphatase: 89 U/L (ref 38–126)
Total Bilirubin: 0.5 mg/dL (ref 0.3–1.2)
Total Protein: 6.9 g/dL (ref 6.5–8.1)

## 2016-09-21 LAB — BASIC METABOLIC PANEL
Anion gap: 12 (ref 5–15)
BUN: 12 mg/dL (ref 6–20)
CHLORIDE: 99 mmol/L — AB (ref 101–111)
CO2: 24 mmol/L (ref 22–32)
CREATININE: 0.73 mg/dL (ref 0.44–1.00)
Calcium: 8.3 mg/dL — ABNORMAL LOW (ref 8.9–10.3)
GFR calc Af Amer: >60 mL/min (ref >60)
GLUCOSE: 83 mg/dL (ref 65–99)
POTASSIUM: 3.3 mmol/L — AB (ref 3.5–5.1)
Sodium: 135 mmol/L (ref 135–145)

## 2016-09-21 LAB — MAGNESIUM: Magnesium: 1.9 mg/dL (ref 1.7–2.4)

## 2016-09-21 LAB — ETHANOL

## 2016-09-21 MED ORDER — LORAZEPAM 1 MG PO TABS
0.0000 mg | ORAL_TABLET | Freq: Four times a day (QID) | ORAL | Status: AC
  Administered 2016-09-21 – 2016-09-22 (×3): 2 mg via ORAL
  Administered 2016-09-22 (×2): 1 mg via ORAL
  Administered 2016-09-23: 2 mg via ORAL
  Filled 2016-09-21 (×2): qty 1
  Filled 2016-09-21 (×4): qty 2

## 2016-09-21 MED ORDER — ADULT MULTIVITAMIN W/MINERALS CH
1.0000 | ORAL_TABLET | Freq: Every day | ORAL | Status: DC
  Administered 2016-09-21 – 2016-09-25 (×5): 1 via ORAL
  Filled 2016-09-21 (×5): qty 1

## 2016-09-21 MED ORDER — LORAZEPAM 2 MG/ML IJ SOLN
0.0000 mg | Freq: Four times a day (QID) | INTRAMUSCULAR | Status: DC
  Administered 2016-09-21: 2 mg via INTRAVENOUS
  Filled 2016-09-21: qty 1

## 2016-09-21 MED ORDER — SODIUM CHLORIDE 0.9 % IV SOLN
INTRAVENOUS | Status: DC
  Administered 2016-09-21 – 2016-09-23 (×6): via INTRAVENOUS

## 2016-09-21 MED ORDER — THIAMINE HCL 100 MG/ML IJ SOLN: 100.0000 mg | Freq: Every day | INTRAMUSCULAR | Status: DC

## 2016-09-21 MED ORDER — ONDANSETRON HCL 4 MG PO TABS
4.0000 mg | ORAL_TABLET | Freq: Four times a day (QID) | ORAL | Status: DC | PRN
  Administered 2016-09-21 – 2016-09-24 (×2): 4 mg via ORAL
  Filled 2016-09-21 (×3): qty 1

## 2016-09-21 MED ORDER — POTASSIUM CHLORIDE CRYS ER 20 MEQ PO TBCR
40.0000 meq | EXTENDED_RELEASE_TABLET | ORAL | Status: AC
  Administered 2016-09-21: 40 meq via ORAL
  Filled 2016-09-21: qty 2

## 2016-09-21 MED ORDER — LORAZEPAM 2 MG/ML IJ SOLN: 0.0000 mg | Freq: Two times a day (BID) | INTRAMUSCULAR | Status: DC

## 2016-09-21 MED ORDER — VITAMIN B-1 100 MG PO TABS: 100.0000 mg | ORAL_TABLET | Freq: Every day | ORAL | Status: DC

## 2016-09-21 MED ORDER — LORAZEPAM 2 MG/ML IJ SOLN: 1.0000 mg | Freq: Four times a day (QID) | INTRAMUSCULAR | Status: AC | PRN

## 2016-09-21 MED ORDER — ACETAMINOPHEN 650 MG RE SUPP: 650.0000 mg | Freq: Four times a day (QID) | RECTAL | Status: DC | PRN

## 2016-09-21 MED ORDER — THIAMINE HCL 100 MG/ML IJ SOLN
100.0000 mg | Freq: Every day | INTRAMUSCULAR | Status: DC
  Administered 2016-09-21: 100 mg via INTRAVENOUS
  Filled 2016-09-21: qty 2

## 2016-09-21 MED ORDER — PROCHLORPERAZINE EDISYLATE 5 MG/ML IJ SOLN: 10.0000 mg | Freq: Four times a day (QID) | INTRAMUSCULAR | Status: DC | PRN

## 2016-09-21 MED ORDER — VITAMIN B-1 100 MG PO TABS
100.0000 mg | ORAL_TABLET | Freq: Every day | ORAL | Status: DC
  Administered 2016-09-21 – 2016-09-25 (×5): 100 mg via ORAL
  Filled 2016-09-21 (×5): qty 1

## 2016-09-21 MED ORDER — NICOTINE 21 MG/24HR TD PT24
21.0000 mg | MEDICATED_PATCH | Freq: Every day | TRANSDERMAL | Status: DC
  Filled 2016-09-21 (×4): qty 1

## 2016-09-21 MED ORDER — LORAZEPAM 1 MG PO TABS
1.0000 mg | ORAL_TABLET | Freq: Four times a day (QID) | ORAL | Status: AC | PRN
  Administered 2016-09-21: 1 mg via ORAL
  Filled 2016-09-21: qty 1

## 2016-09-21 MED ORDER — SODIUM CHLORIDE 0.9 % IV BOLUS (SEPSIS)
1000.0000 mL | Freq: Once | INTRAVENOUS | Status: AC
  Administered 2016-09-21: 1000 mL via INTRAVENOUS

## 2016-09-21 MED ORDER — ACETAMINOPHEN 325 MG PO TABS: 650.0000 mg | ORAL_TABLET | Freq: Four times a day (QID) | ORAL | Status: DC | PRN

## 2016-09-21 MED ORDER — ONDANSETRON HCL 4 MG/2ML IJ SOLN
4.0000 mg | Freq: Once | INTRAMUSCULAR | Status: AC
  Administered 2016-09-21: 4 mg via INTRAVENOUS
  Filled 2016-09-21: qty 2

## 2016-09-21 MED ORDER — FOLIC ACID 1 MG PO TABS
1.0000 mg | ORAL_TABLET | Freq: Every day | ORAL | Status: DC
  Administered 2016-09-21 – 2016-09-25 (×5): 1 mg via ORAL
  Filled 2016-09-21 (×5): qty 1

## 2016-09-21 MED ORDER — ONDANSETRON HCL 4 MG/2ML IJ SOLN
4.0000 mg | Freq: Four times a day (QID) | INTRAMUSCULAR | Status: DC | PRN
Start: 2016-09-21 — End: 2016-09-25
  Administered 2016-09-21 – 2016-09-22 (×3): 4 mg via INTRAVENOUS
  Filled 2016-09-21 (×3): qty 2

## 2016-09-21 MED ORDER — LORAZEPAM 1 MG PO TABS
0.0000 mg | ORAL_TABLET | Freq: Two times a day (BID) | ORAL | Status: AC
  Administered 2016-09-23 – 2016-09-24 (×3): 2 mg via ORAL
  Filled 2016-09-21 (×3): qty 2

## 2016-09-21 MED ORDER — SODIUM CHLORIDE 0.9 % IV SOLN
INTRAVENOUS | Status: DC
  Administered 2016-09-21: 06:00:00 via INTRAVENOUS

## 2016-09-21 NOTE — H&P (Signed)
History and Physical    Kristine Macias SLH:[id number] DOB: June 20, 1986 DOA: 09/21/2016  PCP: Leonides Sake, MD  Patient coming from: Home.  Chief Complaint: Seizures. Alcohol withdrawal.  HPI: Kristine Macias is a 31 y.o. female with history of alcoholism and anxiety disorder presents to the ER because patient's friend noticed patient had 3 episodes of seizures which was generalized tonic-clonic. Each episode lasted for less than a minute. Patient states she was trying to cut down her alcohol intake yesterday. On exam patient appears nonfocal but was tachycardic with early signs of withdrawal. Patient also takes Klonopin for anxiety. Patient states she does not take Klonopin on days which she drinks heavily. Patient was admitted recently for similar complaints  ED Course: Patient was started on CIWA protocol.  Review of Systems: As per HPI, rest all negative.   Past Medical History:  Diagnosis Date  . Alcohol abuse 11/2014   drinking since age 18. chronic, recurrent. at least 2 detox admits before 2015.   Marland Kitchen Alcoholic hepatitis 08/5724   hepatic steatosis on 11/2014 ultrasound.   . Breast cancer [facility])    age 41, lump removed from left breast  . Chlamydia 2007   bacterial vaginosis 09/2011  . Colitis 12/2014   colonoscopy for diarrhea and abnml CT 01/06/15: erythmatous TI (path: active ileitis, ? emerging IBD), rectal erythema (path: mucosal prolapse). Random bx of normal colon (path unremarkable)   . Congenital deafness    left ear only.  Also noted in mother and sibling (unilateral).   . Depression with anxiety initally at age 52  . Failure to thrive in adult 12/2014.    Malnutrition: n/v, not eating, weight loss, BMI 14.  s/p 01/11/2015 PEG (Dr Dorna Leitz).   . Irregular heart beat 2010   wt/diet related after evaluation  . Pancreas divisum 02/2015   Type 1 seen on CT.   Marland Kitchen Pneumothorax, spontaneous, tension   . Renal disorder   . Seizure ([place]) 06/2015   due to ETOH/benzo  withdrawal.   . Spontaneous pneumothorax 08/2012   right.  chest tube placed.   . Thrombocytopenia ([place]) 04/2007    Past Surgical History:  Procedure Laterality Date  . CESAREAN SECTION  07/2009    x 1.  G3, Para 2012.   . CHEST TUBE INSERTION    . COLONOSCOPY WITH PROPOFOL N/A 01/06/2015   [facility], Dr Rayann Heman. colonoscopy for diarrhea and abnml CT 01/06/15: erythmatous TI (path: active ileitis, ? emerging IBD), rectal erythema (path: mucosal prolapse). Random bx of normal colon (path unremarkable)   . ESOPHAGOGASTRODUODENOSCOPY N/A 01/06/2015   ;[facility], Dr Rayann Heman. For wt loss, N/V: Normal study, duodenal biopsy/pathology: chronic active duodenitis.   Marland Kitchen ESOPHAGOGASTRODUODENOSCOPY N/A 01/11/2015   Rein-normal with PEG placement  . PEG PLACEMENT N/A 01/11/2015   [facility], Dr Rayann Heman.  for N/V/wt loss/severe malnutrition.      reports that she has been smoking Cigarettes.  She has been smoking about 2.00 packs per day. She has never used smokeless tobacco. She reports that she drinks about 21.6 oz of alcohol per week . She reports that she does not use drugs.  Allergies  Allergen Reactions  . Aspirin Shortness Of Breath  . Effexor [Venlafaxine] Anaphylaxis  . Gabapentin Anaphylaxis  . Sulfa Antibiotics Anaphylaxis  . Tetracyclines & Related Anaphylaxis  . Trazodone And Nefazodone Anaphylaxis  . Latex Hives and Itching  . Paxil [Paroxetine] Hives and Itching  . Seroquel [Quetiapine Fumarate] Hives and Itching  . Zoloft [Sertraline Hcl] Hives and  Itching    Family History  Problem Relation Age of Onset  . Thyroid disease Mother   . Cancer Mother     breast cancer  . Cancer Father     lung cancer  . Stroke Other   . Crohn's disease Brother   . Cancer Maternal Grandmother     breast cancer  . Anesthesia problems Neg Hx     Prior to Admission medications   Medication Sig Start Date End Date Taking? Authorizing Provider  folic acid (FOLVITE) 1 MG tablet Take 1 tablet (1 mg total) by mouth  daily. Patient not taking: Reported on 09/21/2016 08/06/16   Clanford Marisa Hua, MD  Multiple Vitamin (MULTIVITAMIN WITH MINERALS) TABS tablet Take 1 tablet by mouth daily. Patient not taking: Reported on 09/21/2016 08/07/16   Clanford Marisa Hua, MD  thiamine 100 MG tablet Take 1 tablet (100 mg total) by mouth daily. Patient not taking: Reported on 09/21/2016 08/06/16   Murlean Iba, MD    Physical Exam: Vitals:   09/21/16 0202 09/21/16 0400 09/21/16 0400 09/21/16 0500  BP: 128/84 124/89 118/83 (!) 132/106  Pulse: 108 90 89 80  Resp: 20     Temp:      TempSrc:      SpO2: 99% 94%  95%  Weight:      Height:          Constitutional: Moderately built and nourished. Vitals:   09/21/16 0202 09/21/16 0400 09/21/16 0400 09/21/16 0500  BP: 128/84 124/89 118/83 (!) 132/106  Pulse: 108 90 89 80  Resp: 20     Temp:      TempSrc:      SpO2: 99% 94%  95%  Weight:      Height:       Eyes: Anicteric. No pallor. ENMT: No discharge from the ears eyes nose and mouth. Neck: No mass felt. No neck rigidity. Respiratory: No rhonchi or crepitations. Cardiovascular: S1 and S2 heard no murmurs appreciated. Abdomen: Soft nontender bowel sounds present. Musculoskeletal: No edema no joint effusion. Skin: No rash. Skin appears normal. Neurologic: Alert awake oriented to time place and person. Moves all activities 5 x 5. No facial asymmetry. Tongue is midline. Psychiatric: Appears normal. Normal affect.   Labs on Admission: I have personally reviewed following labs and imaging studies  CBC:  Recent Labs Lab 09/20/16 1425 09/20/16 2151  WBC 8.5 10.5  NEUTROABS 5.7 6.2  HGB 12.7 14.8  HCT 39.1 44.5  MCV 85.6 85.9  PLT 402 876*   Basic Metabolic Panel:  Recent Labs Lab 09/20/16 1425 09/20/16 2151  NA 135 136  K 3.3* 3.3*  CL 101 96*  CO2 24 25  GLUCOSE 88 100*  BUN 17 15  CREATININE 0.60 0.65  CALCIUM 7.3* 8.8*   GFR: Estimated Creatinine Clearance: 91.7 mL/min (by C-G  formula based on SCr of 0.65 mg/dL). Liver Function Tests:  Recent Labs Lab 09/20/16 1425 09/20/16 2151  AST 61* 55*  ALT 52 60*  ALKPHOS 89 102  BILITOT 0.6 0.5  PROT 7.0 7.6  ALBUMIN 3.4* 3.9   No results for input(s): LIPASE, AMYLASE in the last 168 hours. No results for input(s): AMMONIA in the last 168 hours. Coagulation Profile: No results for input(s): INR, PROTIME in the last 168 hours. Cardiac Enzymes: No results for input(s): CKTOTAL, CKMB, CKMBINDEX, TROPONINI in the last 168 hours. BNP (last 3 results) No results for input(s): PROBNP in the last 8760 hours. HbA1C: No results for input(s):  HGBA1C in the last 72 hours. CBG: No results for input(s): GLUCAP in the last 168 hours. Lipid Profile: No results for input(s): CHOL, HDL, LDLCALC, TRIG, CHOLHDL, LDLDIRECT in the last 72 hours. Thyroid Function Tests: No results for input(s): TSH, T4TOTAL, FREET4, T3FREE, THYROIDAB in the last 72 hours. Anemia Panel: No results for input(s): VITAMINB12, FOLATE, FERRITIN, TIBC, IRON, RETICCTPCT in the last 72 hours. Urine analysis:    Component Value Date/Time   COLORURINE YELLOW 09/20/2016 2152   APPEARANCEUR HAZY (A) 09/20/2016 2152   APPEARANCEUR Clear 02/15/2016 1456   LABSPEC 1.025 09/20/2016 2152   LABSPEC 1.006 12/20/2014 1018   PHURINE 7.0 09/20/2016 2152   GLUCOSEU NEGATIVE 09/20/2016 2152   GLUCOSEU Negative 12/20/2014 1018   HGBUR NEGATIVE 09/20/2016 2152   BILIRUBINUR NEGATIVE 09/20/2016 2152   BILIRUBINUR neg 03/13/2016 1700   BILIRUBINUR Negative 02/15/2016 1456   BILIRUBINUR Negative 12/20/2014 1018   KETONESUR 5 (A) 09/20/2016 2152   PROTEINUR 30 (A) 09/20/2016 2152   UROBILINOGEN negative 03/13/2016 1700   UROBILINOGEN 0.2 06/13/2012 2306   NITRITE NEGATIVE 09/20/2016 2152   LEUKOCYTESUR NEGATIVE 09/20/2016 2152   LEUKOCYTESUR Negative 02/15/2016 1456   LEUKOCYTESUR Negative 12/20/2014 1018   Sepsis  Labs: @LABRCNTIP (procalcitonin:4,lacticidven:4) )No results found for this or any previous visit (from the past 240 hour(s)).   Radiological Exams on Admission: Dg Chest 2 View  Result Date: 09/20/2016 CLINICAL DATA:  Status post seizure. Productive cough, nausea and vomiting, acute onset. Initial encounter. EXAM: CHEST  2 VIEW COMPARISON:  Chest radiograph from 10/18/2015 FINDINGS: The lungs are well-aerated and clear. There is no evidence of focal opacification, pleural effusion or pneumothorax. The heart is normal in size; the mediastinal contour is within normal limits. No acute osseous abnormalities are seen. IMPRESSION: No acute cardiopulmonary process seen. Electronically Signed   By: Garald Balding M.D.   On: 09/20/2016 22:53     Assessment/Plan Principal Problem:   Alcohol withdrawal ([place]) Active Problems:   Seizure due to alcohol withdrawal ([place])    1. Alcohol withdrawal with alcohol withdrawal seizures - patient has been advised to stop drinking alcohol. Urine drug screen is pending. Will check CT head. Patient is placed on CIWA protocol. 2. History of anxiety disorder - usually takes Klonopin presently on CIWA protocol.   DVT prophylaxis: SCDs until CT head results are available. Code Status: Full code.  Family Communication: Discussed with patient.  Disposition Plan: Home.  Consults called: None.  Admission status: Observation.    Rise Patience MD [facility] Pager [phone].  If 7PM-7AM, please contact night-coverage [email or web address] Password [email or web address]  09/21/2016, 5:06 AM

## 2016-09-21 NOTE — Progress Notes (Signed)
Felise [place] [id number] Admission Data: 09/21/2016 6:35 PM Attending Provider: Eugenie Filler, MD  SHF:WYOVZCH,YIFOY L, MD Consults/ Treatment Team:   Johnika Escareno is a 31 y.o. female patient admitted from ED awake, alert  & orientated  X 3,  Full Code, VSS - Blood pressure 135/77, pulse 80, temperature 98.6 F (37 C), temperature source Oral, resp. rate 17, height 5' 5"  (1.651 m), weight 62.6 kg (138 lb 1 oz), last menstrual period 07/28/2015, SpO2 100 %. no c/o shortness of breath, no c/o chest pain, no distress noted. Tele # 19 placed and pt is currently running:normal sinus rhythm.   IV site WDL:  hand right, condition patent and no redness with a transparent dsg that's clean dry and intact.  Allergies:   Allergies  Allergen Reactions  . Aspirin Shortness Of Breath  . Effexor [Venlafaxine] Anaphylaxis  . Gabapentin Anaphylaxis  . Sulfa Antibiotics Anaphylaxis  . Tetracyclines & Related Anaphylaxis  . Trazodone And Nefazodone Anaphylaxis  . Latex Hives and Itching  . Paxil [Paroxetine] Hives and Itching  . Seroquel [Quetiapine Fumarate] Hives and Itching  . Zoloft [Sertraline Hcl] Hives and Itching     Past Medical History:  Diagnosis Date  . Alcohol abuse 11/2014   drinking since age 4. chronic, recurrent. at least 2 detox admits before 2015.   Marland Kitchen Alcoholic hepatitis 02/7411   hepatic steatosis on 11/2014 ultrasound.   . Breast cancer [facility])    age 42, lump removed from left breast  . Chlamydia 2007   bacterial vaginosis 09/2011  . Colitis 12/2014   colonoscopy for diarrhea and abnml CT 01/06/15: erythmatous TI (path: active ileitis, ? emerging IBD), rectal erythema (path: mucosal prolapse). Random bx of normal colon (path unremarkable)   . Congenital deafness    left ear only.  Also noted in mother and sibling (unilateral).   . Depression with anxiety initally at age 1  . Failure to thrive in adult 12/2014.    Malnutrition: n/v, not eating, weight loss, BMI 14.   s/p 01/11/2015 PEG (Dr Dorna Leitz).   . Irregular heart beat 2010   wt/diet related after evaluation  . Pancreas divisum 02/2015   Type 1 seen on CT.   Marland Kitchen Pneumothorax, spontaneous, tension   . Renal disorder   . Seizure ([place]) 06/2015   due to ETOH/benzo withdrawal.   . Spontaneous pneumothorax 08/2012   right.  chest tube placed.   . Thrombocytopenia ([place]) 04/2007    History:  obtained from chart review. Tobacco/alcohol: unknown tobacco use history of alcohol abuse Since age 74  Pt orientation to unit, room and routine. Information packet given to patient/family and safety video watched.  Admission INP armband ID verified with patient/family, and in place. SR up x 2, fall risk assessment complete with Patient and family verbalizing understanding of risks associated with falls. Pt verbalizes an understanding of how to use the call bell and to call for help before getting out of bed.  Skin, clean-dry- intact without evidence of bruising, or skin tears.   No evidence of skin break down noted on exam. color normal, vascularity normal, no rashes or suspicious lesions, no evidence of bleeding or bruising, no lesions noted, no rash, no edema, temperature normal, texture normal    Will cont to monitor and assist as needed.  Salley Slaughter, RN 09/21/2016 6:35 PM

## 2016-09-21 NOTE — ED Notes (Signed)
Attempted to call Vickie RN on floor RN unable to take report will call back

## 2016-09-21 NOTE — Progress Notes (Signed)
I have seen and assessed patient and agree with Dr Moise Boring assessment and plan.
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
## 2016-09-22 DIAGNOSIS — F10239 Alcohol dependence with withdrawal, unspecified: Principal | ICD-10-CM

## 2016-09-22 DIAGNOSIS — F411 Generalized anxiety disorder: Secondary | ICD-10-CM

## 2016-09-22 DIAGNOSIS — Z79899 Other long term (current) drug therapy: Secondary | ICD-10-CM | POA: Diagnosis not present

## 2016-09-22 DIAGNOSIS — F17210 Nicotine dependence, cigarettes, uncomplicated: Secondary | ICD-10-CM | POA: Diagnosis present

## 2016-09-22 DIAGNOSIS — Z853 Personal history of malignant neoplasm of breast: Secondary | ICD-10-CM | POA: Diagnosis not present

## 2016-09-22 DIAGNOSIS — F10230 Alcohol dependence with withdrawal, uncomplicated: Secondary | ICD-10-CM | POA: Diagnosis not present

## 2016-09-22 DIAGNOSIS — Z720 Tobacco use: Secondary | ICD-10-CM | POA: Diagnosis not present

## 2016-09-22 DIAGNOSIS — F10939 Alcohol use, unspecified with withdrawal, unspecified: Secondary | ICD-10-CM

## 2016-09-22 DIAGNOSIS — E860 Dehydration: Secondary | ICD-10-CM | POA: Diagnosis not present

## 2016-09-22 DIAGNOSIS — F1010 Alcohol abuse, uncomplicated: Secondary | ICD-10-CM | POA: Diagnosis not present

## 2016-09-22 DIAGNOSIS — H9192 Unspecified hearing loss, left ear: Secondary | ICD-10-CM | POA: Diagnosis present

## 2016-09-22 DIAGNOSIS — R569 Unspecified convulsions: Secondary | ICD-10-CM | POA: Diagnosis not present

## 2016-09-22 DIAGNOSIS — G40409 Other generalized epilepsy and epileptic syndromes, not intractable, without status epilepticus: Secondary | ICD-10-CM | POA: Diagnosis present

## 2016-09-22 LAB — COMPREHENSIVE METABOLIC PANEL
ALT: 42 U/L (ref 14–54)
ANION GAP: 8 (ref 5–15)
AST: 44 U/L — ABNORMAL HIGH (ref 15–41)
Albumin: 2.8 g/dL — ABNORMAL LOW (ref 3.5–5.0)
Alkaline Phosphatase: 85 U/L (ref 38–126)
BUN: 9 mg/dL (ref 6–20)
CHLORIDE: 110 mmol/L (ref 101–111)
CO2: 22 mmol/L (ref 22–32)
Calcium: 8.3 mg/dL — ABNORMAL LOW (ref 8.9–10.3)
Creatinine, Ser: 0.7 mg/dL (ref 0.44–1.00)
GFR calc non Af Amer: >60 mL/min (ref >60)
Glucose, Bld: 100 mg/dL — ABNORMAL HIGH (ref 65–99)
POTASSIUM: 3.8 mmol/L (ref 3.5–5.1)
SODIUM: 140 mmol/L (ref 135–145)
Total Bilirubin: 0.5 mg/dL (ref 0.3–1.2)
Total Protein: 5.8 g/dL — ABNORMAL LOW (ref 6.5–8.1)

## 2016-09-22 LAB — CBC WITH DIFFERENTIAL/PLATELET
Basophils Absolute: 0 10*3/uL (ref 0.0–0.1)
Basophils Relative: 1 %
Eosinophils Absolute: 0.4 10*3/uL (ref 0.0–0.7)
Eosinophils Relative: 5 %
HEMATOCRIT: 38.1 % (ref 36.0–46.0)
HEMOGLOBIN: 12 g/dL (ref 12.0–15.0)
LYMPHS ABS: 3 10*3/uL (ref 0.7–4.0)
LYMPHS PCT: 35 %
MCH: 27.9 pg (ref 26.0–34.0)
MCHC: 31.5 g/dL (ref 30.0–36.0)
MCV: 88.6 fL (ref 78.0–100.0)
MONOS PCT: 7 %
Monocytes Absolute: 0.6 10*3/uL (ref 0.1–1.0)
NEUTROS ABS: 4.7 10*3/uL (ref 1.7–7.7)
NEUTROS PCT: 54 %
Platelets: 370 10*3/uL (ref 150–400)
RBC: 4.3 MIL/uL (ref 3.87–5.11)
RDW: 16.4 % — ABNORMAL HIGH (ref 11.5–15.5)
WBC: 8.7 10*3/uL (ref 4.0–10.5)

## 2016-09-22 NOTE — Progress Notes (Signed)
PROGRESS NOTE    Kristine Macias  JFH:[id number] DOB: 28-Jul-1986 DOA: 09/21/2016 PCP: No PCP Per Patient   Brief Narrative:  Patient is a 31 year old female history of alcoholism, anxiety disorder presented to the ED secondary to 3 episodes of seizures noted which were generalized tonic-clonic seizures lasting less than a minute. Patient had been trying to decrease alcohol intake one day prior to admission. Patient was admitted for alcohol withdrawal seizures.   Assessment & Plan:   Principal Problem:   Seizure due to alcohol withdrawal (HCC) Active Problems:   Generalized anxiety disorder   Alcohol abuse   Tobacco use   Alcohol withdrawal ([place])   Alcohol withdrawal syndrome with complication ([place])   Dehydration  #1 Alcohol withdrawal seizures Patient with no further seizures. Alcohol level on admission was less than 5. CT head unremarkable. Chest x-ray negative. Patient with no signs or symptoms of infection. Patient with recurrent admissions secondary to alcohol withdrawal seizures. Continue the Ativan withdrawal protocol. IV fluids. Supportive care.  #2 history of anxiety disorder Continue the Ativan withdrawal protocol.  #3 dehydration IV fluids.  #4 tobacco abuse Tobacco cessation. Nicotine patch.  #5 history of alcohol abuse Patient was trying to cut down on her alcohol intake when she had alcohol withdrawal seizures. Continue the Ativan withdrawal protocol, folic acid, thiamine, multivitamin.   DVT prophylaxis: SCDs Code Status: Full Family Communication: Updated patient and friend at bedside. Disposition Plan: Home when medically stable, Ativan withdrawal protocol has been completed and no further seizures.   Consultants:   None  Procedures:   CT head 09/21/2016  Chest x-ray 09/20/2016  Antimicrobials:   None   Subjective: Patient laying in bed with probably significant other. Patient with complaints of nausea and dry heaves. Patient denies  any abdominal pain. No shortness of breath. No chest pain. Patient with tremors.  Objective: Vitals:   09/21/16 1434 09/21/16 2054 09/21/16 2341 09/22/16 0624  BP: 135/77 124/74 132/87 96/64  Pulse: 80 74 81 82  Resp: 17  20   Temp:   97.6 F (36.4 C) 97.8 F (36.6 C)  TempSrc:   Oral Oral  SpO2: 100%  100% 99%  Weight:      Height:        Intake/Output Summary (Last 24 hours) at 09/22/16 1214 Last data filed at 09/22/16 1156  Gross per 24 hour  Intake           3507.5 ml  Output                0 ml  Net           3507.5 ml   Filed Weights   09/20/16 2132  Weight: 62.6 kg (138 lb 1 oz)    Examination:  General exam: Tremors Respiratory system: Clear to auscultation. Respiratory effort normal. Cardiovascular system: S1 & S2 heard, RRR. No JVD, murmurs, rubs, gallops or clicks. No pedal edema. Gastrointestinal system: Abdomen is nondistended, soft and nontender. No organomegaly or masses felt. Normal bowel sounds heard. Central nervous system: Alert and oriented. No focal neurological deficits. Extremities: Symmetric 5 x 5 power. Skin: No rashes, lesions or ulcers Psychiatry: Judgement and insight appear normal. Mood & affect appropriate.     Data Reviewed: I have personally reviewed following labs and imaging studies  CBC:  Recent Labs Lab 09/20/16 1425 09/20/16 2151 09/21/16 0553 09/22/16 0541  WBC 8.5 10.5 11.2* 8.7  NEUTROABS 5.7 6.2 6.8 4.7  HGB 12.7 14.8 13.4 12.0  HCT  39.1 44.5 41.6 38.1  MCV 85.6 85.9 86.7 88.6  PLT 402 521* 463* 646   Basic Metabolic Panel:  Recent Labs Lab 09/20/16 1425 09/20/16 2151 09/21/16 0553 09/22/16 0541  NA 135 136 135 140  K 3.3* 3.3* 3.3* 3.8  CL 101 96* 99* 110  CO2 24 25 24 22   GLUCOSE 88 100* 83 100*  BUN 17 15 12 9   CREATININE 0.60 0.65 0.73 0.70  CALCIUM 7.3* 8.8* 8.3* 8.3*  MG  --   --  1.9  --    GFR: Estimated Creatinine Clearance: 91.7 mL/min (by C-G formula based on SCr of 0.7 mg/dL). Liver  Function Tests:  Recent Labs Lab 09/20/16 1425 09/20/16 2151 09/21/16 0553 09/22/16 0541  AST 61* 55* 53* 44*  ALT 52 60* 51 42  ALKPHOS 89 102 89 85  BILITOT 0.6 0.5 0.5 0.5  PROT 7.0 7.6 6.9 5.8*  ALBUMIN 3.4* 3.9 3.4* 2.8*   No results for input(s): LIPASE, AMYLASE in the last 168 hours. No results for input(s): AMMONIA in the last 168 hours. Coagulation Profile: No results for input(s): INR, PROTIME in the last 168 hours. Cardiac Enzymes: No results for input(s): CKTOTAL, CKMB, CKMBINDEX, TROPONINI in the last 168 hours. BNP (last 3 results) No results for input(s): PROBNP in the last 8760 hours. HbA1C: No results for input(s): HGBA1C in the last 72 hours. CBG: No results for input(s): GLUCAP in the last 168 hours. Lipid Profile: No results for input(s): CHOL, HDL, LDLCALC, TRIG, CHOLHDL, LDLDIRECT in the last 72 hours. Thyroid Function Tests: No results for input(s): TSH, T4TOTAL, FREET4, T3FREE, THYROIDAB in the last 72 hours. Anemia Panel: No results for input(s): VITAMINB12, FOLATE, FERRITIN, TIBC, IRON, RETICCTPCT in the last 72 hours. Sepsis Labs: No results for input(s): PROCALCITON, LATICACIDVEN in the last 168 hours.  No results found for this or any previous visit (from the past 240 hour(s)).       Radiology Studies: Dg Chest 2 View  Result Date: 09/20/2016 CLINICAL DATA:  Status post seizure. Productive cough, nausea and vomiting, acute onset. Initial encounter. EXAM: CHEST  2 VIEW COMPARISON:  Chest radiograph from 10/18/2015 FINDINGS: The lungs are well-aerated and clear. There is no evidence of focal opacification, pleural effusion or pneumothorax. The heart is normal in size; the mediastinal contour is within normal limits. No acute osseous abnormalities are seen. IMPRESSION: No acute cardiopulmonary process seen. Electronically Signed   By: Garald Balding M.D.   On: 09/20/2016 22:53   Ct Head Wo Contrast  Result Date: 09/21/2016 CLINICAL DATA:   Initial evaluation for acute seizure. EXAM: CT HEAD WITHOUT CONTRAST TECHNIQUE: Contiguous axial images were obtained from the base of the skull through the vertex without intravenous contrast. COMPARISON:  Prior study from 01/13/2015. FINDINGS: Brain: Cerebral volume within normal limits. Gray-white matter differentiation maintained. No evidence for acute intracranial hemorrhage. No evidence for acute large vessel territory infarct. No mass lesion, midline shift or mass effect. No hydrocephalus. No extra-axial fluid collection. Vascular: No hyperdense vessel. Skull: Scalp soft tissues within normal limits.  Calvarium intact. Sinuses/Orbits: Globes and orbits within normal limits. Visualized paranasal sinuses and mastoids are clear. IMPRESSION: No acute intracranial process identified. Electronically Signed   By: Jeannine Boga M.D.   On: 09/21/2016 06:25        Scheduled Meds: . folic acid  1 mg Oral Daily  . LORazepam  0-4 mg Oral Q6H   Followed by  . [START ON 09/23/2016] LORazepam  0-4 mg  Oral Q12H  . multivitamin with minerals  1 tablet Oral Daily  . nicotine  21 mg Transdermal Daily  . thiamine  100 mg Oral Daily   Continuous Infusions: . sodium chloride 100 mL/hr at 09/22/16 1111     LOS: 0 days    Time spent: 69 mins    Devaunte Gasparini, MD [facility] Pager [phone]  If 7PM-7AM, please contact night-coverage [email or web address] Password [email or web address] 09/22/2016, 12:14 PM

## 2016-09-23 LAB — BASIC METABOLIC PANEL
Anion gap: 7 (ref 5–15)
BUN: 6 mg/dL (ref 6–20)
CALCIUM: 8.5 mg/dL — AB (ref 8.9–10.3)
CHLORIDE: 108 mmol/L (ref 101–111)
CO2: 22 mmol/L (ref 22–32)
CREATININE: 0.63 mg/dL (ref 0.44–1.00)
GFR calc Af Amer: >60 mL/min (ref >60)
GFR calc non Af Amer: >60 mL/min (ref >60)
Glucose, Bld: 98 mg/dL (ref 65–99)
Potassium: 3.6 mmol/L (ref 3.5–5.1)
SODIUM: 137 mmol/L (ref 135–145)

## 2016-09-23 LAB — MAGNESIUM: MAGNESIUM: 1.6 mg/dL — AB (ref 1.7–2.4)

## 2016-09-23 MED ORDER — POTASSIUM CHLORIDE CRYS ER 20 MEQ PO TBCR
40.0000 meq | EXTENDED_RELEASE_TABLET | Freq: Once | ORAL | Status: AC
  Administered 2016-09-23: 40 meq via ORAL
  Filled 2016-09-23: qty 2

## 2016-09-23 MED ORDER — MAGNESIUM SULFATE 4 GM/100ML IV SOLN
4.0000 g | Freq: Once | INTRAVENOUS | Status: AC
  Administered 2016-09-23: 4 g via INTRAVENOUS
  Filled 2016-09-23: qty 100

## 2016-09-23 NOTE — Progress Notes (Signed)
PROGRESS NOTE    Kristine Macias  JHE:[id number] DOB: [birth year] DOA: 09/21/2016 PCP: No PCP Per Patient   Brief Narrative:  Patient is a 31 year old female history of alcoholism, anxiety disorder presented to the ED secondary to 3 episodes of seizures noted which were generalized tonic-clonic seizures lasting less than a minute. Patient had been trying to decrease alcohol intake one day prior to admission. Patient was admitted for alcohol withdrawal seizures.   Assessment & Plan:   Principal Problem:   Seizure due to alcohol withdrawal (HCC) Active Problems:   Generalized anxiety disorder   Alcohol abuse   Tobacco use   Alcohol withdrawal ([place])   Alcohol withdrawal syndrome with complication ([place])   Dehydration  #1 Alcohol withdrawal seizures Patient with no further seizures. Alcohol level on admission was less than 5. CT head unremarkable. Chest x-ray negative. Patient with no signs or symptoms of infection. Patient with recurrent admissions secondary to alcohol withdrawal seizures. Continue the Ativan withdrawal protocol. NSL IV fluids. Supportive care.  #2 history of anxiety disorder Continue the Ativan withdrawal protocol.  #3 dehydration NSL IV fluids.  #4 tobacco abuse Tobacco cessation. Nicotine patch.  #5 history of alcohol abuse Patient was trying to cut down on her alcohol intake when she had alcohol withdrawal seizures. Continue the Ativan withdrawal protocol, folic acid, thiamine, multivitamin.   DVT prophylaxis: SCDs Code Status: Full Family Communication: Updated patient and friend at bedside. Disposition Plan: Home when medically stable, Ativan withdrawal protocol has been completed and no further seizures.   Consultants:   None  Procedures:   CT head 09/21/2016  Chest x-ray 09/20/2016  Antimicrobials:   None   Subjective: Patient states starting to feel better. Nausea improved. Patient denies any abdominal pain. No shortness of  breath. No chest pain. Per nursing patient with no seizures.  Objective: Vitals:   09/22/16 0624 09/22/16 1430 09/22/16 2348 09/23/16 0630  BP: 96/64 (!) 142/92 139/82 111/67  Pulse: 82 75 61 78  Resp:  18 16 18   Temp: 97.8 F (36.6 C) 98.2 F (36.8 C) 98.8 F (37.1 C) 98.2 F (36.8 C)  TempSrc: Oral  Oral Oral  SpO2: 99% 100% 99% 99%  Weight:      Height:        Intake/Output Summary (Last 24 hours) at 09/23/16 1316 Last data filed at 09/23/16 0644  Gross per 24 hour  Intake          1453.33 ml  Output                0 ml  Net          1453.33 ml   Filed Weights   09/20/16 2132  Weight: 62.6 kg (138 lb 1 oz)    Examination:  General exam: Less Tremors Respiratory system: Clear to auscultation. Respiratory effort normal. Cardiovascular system: S1 & S2 heard, RRR. No JVD, murmurs, rubs, gallops or clicks. No pedal edema. Gastrointestinal system: Abdomen is nondistended, soft and nontender. No organomegaly or masses felt. Normal bowel sounds heard. Central nervous system: Alert and oriented. No focal neurological deficits. Extremities: Symmetric 5 x 5 power. Skin: No rashes, lesions or ulcers Psychiatry: Judgement and insight appear normal. Mood & affect appropriate.     Data Reviewed: I have personally reviewed following labs and imaging studies  CBC:  Recent Labs Lab 09/20/16 1425 09/20/16 2151 09/21/16 0553 09/22/16 0541  WBC 8.5 10.5 11.2* 8.7  NEUTROABS 5.7 6.2 6.8 4.7  HGB 12.7 14.8 13.4  12.0  HCT 39.1 44.5 41.6 38.1  MCV 85.6 85.9 86.7 88.6  PLT 402 521* 463* 034   Basic Metabolic Panel:  Recent Labs Lab 09/20/16 1425 09/20/16 2151 09/21/16 0553 09/22/16 0541 09/23/16 0519  NA 135 136 135 140 137  K 3.3* 3.3* 3.3* 3.8 3.6  CL 101 96* 99* 110 108  CO2 24 25 24 22 22   GLUCOSE 88 100* 83 100* 98  BUN 17 15 12 9 6   CREATININE 0.60 0.65 0.73 0.70 0.63  CALCIUM 7.3* 8.8* 8.3* 8.3* 8.5*  MG  --   --  1.9  --  1.6*   GFR: Estimated  Creatinine Clearance: 91.7 mL/min (by C-G formula based on SCr of 0.63 mg/dL). Liver Function Tests:  Recent Labs Lab 09/20/16 1425 09/20/16 2151 09/21/16 0553 09/22/16 0541  AST 61* 55* 53* 44*  ALT 52 60* 51 42  ALKPHOS 89 102 89 85  BILITOT 0.6 0.5 0.5 0.5  PROT 7.0 7.6 6.9 5.8*  ALBUMIN 3.4* 3.9 3.4* 2.8*   No results for input(s): LIPASE, AMYLASE in the last 168 hours. No results for input(s): AMMONIA in the last 168 hours. Coagulation Profile: No results for input(s): INR, PROTIME in the last 168 hours. Cardiac Enzymes: No results for input(s): CKTOTAL, CKMB, CKMBINDEX, TROPONINI in the last 168 hours. BNP (last 3 results) No results for input(s): PROBNP in the last 8760 hours. HbA1C: No results for input(s): HGBA1C in the last 72 hours. CBG: No results for input(s): GLUCAP in the last 168 hours. Lipid Profile: No results for input(s): CHOL, HDL, LDLCALC, TRIG, CHOLHDL, LDLDIRECT in the last 72 hours. Thyroid Function Tests: No results for input(s): TSH, T4TOTAL, FREET4, T3FREE, THYROIDAB in the last 72 hours. Anemia Panel: No results for input(s): VITAMINB12, FOLATE, FERRITIN, TIBC, IRON, RETICCTPCT in the last 72 hours. Sepsis Labs: No results for input(s): PROCALCITON, LATICACIDVEN in the last 168 hours.  No results found for this or any previous visit (from the past 240 hour(s)).       Radiology Studies: No results found.      Scheduled Meds: . folic acid  1 mg Oral Daily  . LORazepam  0-4 mg Oral Q12H  . multivitamin with minerals  1 tablet Oral Daily  . nicotine  21 mg Transdermal Daily  . thiamine  100 mg Oral Daily   Continuous Infusions: . sodium chloride 100 mL/hr at 09/23/16 0659     LOS: 1 day    Time spent: 74 mins    THOMPSON,DANIEL, MD [facility] Pager [phone]  If 7PM-7AM, please contact night-coverage [email or web address] Password [email or web address] 09/23/2016, 1:16 PM

## 2016-09-24 DIAGNOSIS — R569 Unspecified convulsions: Secondary | ICD-10-CM

## 2016-09-24 LAB — BASIC METABOLIC PANEL
Anion gap: 9 (ref 5–15)
BUN: 8 mg/dL (ref 6–20)
CHLORIDE: 104 mmol/L (ref 101–111)
CO2: 22 mmol/L (ref 22–32)
Calcium: 9.2 mg/dL (ref 8.9–10.3)
Creatinine, Ser: 0.55 mg/dL (ref 0.44–1.00)
GFR calc Af Amer: >60 mL/min (ref >60)
GFR calc non Af Amer: >60 mL/min (ref >60)
GLUCOSE: 91 mg/dL (ref 65–99)
POTASSIUM: 3.9 mmol/L (ref 3.5–5.1)
Sodium: 135 mmol/L (ref 135–145)

## 2016-09-24 LAB — MAGNESIUM: MAGNESIUM: 2 mg/dL (ref 1.7–2.4)

## 2016-09-24 MED ORDER — LORAZEPAM 1 MG PO TABS
1.0000 mg | ORAL_TABLET | Freq: Four times a day (QID) | ORAL | Status: DC | PRN
  Administered 2016-09-24 – 2016-09-25 (×3): 1 mg via ORAL
  Filled 2016-09-24 (×3): qty 1

## 2016-09-24 MED ORDER — LORAZEPAM 2 MG/ML IJ SOLN: 1.0000 mg | Freq: Four times a day (QID) | INTRAMUSCULAR | Status: DC | PRN

## 2016-09-24 NOTE — Progress Notes (Signed)
PROGRESS NOTE    Kristine Macias  UXN:[id number] DOB: [birth year] DOA: 09/21/2016 PCP: No PCP Per Patient   Brief Narrative:  Patient is a 31 year old female history of alcoholism, anxiety disorder presented to the ED secondary to 3 episodes of seizures noted which were generalized tonic-clonic seizures lasting less than a minute. Patient had been trying to decrease alcohol intake one day prior to admission. Patient was admitted for alcohol withdrawal seizures.   Assessment & Plan:   Principal Problem:   Seizure due to alcohol withdrawal (HCC) Active Problems:   Generalized anxiety disorder   Alcohol abuse   Tobacco use   Alcohol withdrawal ([place])   Alcohol withdrawal syndrome with complication ([place])   Dehydration  #1 Alcohol withdrawal seizures Patient with no further seizures. Alcohol level on admission was less than 5. CT head unremarkable. Chest x-ray negative. Patient with no signs or symptoms of infection. Patient with recurrent admissions secondary to alcohol withdrawal seizures. Continue the Ativan withdrawal protocol. NSL IV fluids. Supportive care.  #2 history of anxiety disorder Continue the Ativan withdrawal protocol. Will resume when necessary Ativan which recently expired.  #3 dehydration NSL IV fluids.  #4 tobacco abuse Tobacco cessation. Nicotine patch.  #5 history of alcohol abuse Patient was trying to cut down on her alcohol intake when she had alcohol withdrawal seizures. Continue the Ativan withdrawal protocol, folic acid, thiamine, multivitamin.   DVT prophylaxis: SCDs Code Status: Full Family Communication: Updated patient. No family at bedside. Disposition Plan: Home when medically stable, Ativan withdrawal protocol has been completed and no further seizures, hopefully tomorrow.   Consultants:   None  Procedures:   CT head 09/21/2016  Chest x-ray 09/20/2016  Antimicrobials:   None   Subjective: Patient states feels better. No  nausea. No abdominal pain. No chest pain. No shortness of breath. No further seizures noted. Tolerating oral intake. Patient states was on Klonopin twice daily at home and as Ativan withdrawal protocol is being tapered down on sure last to whether she needs Klonopin resumed.  Objective: Vitals:   09/23/16 1424 09/23/16 1748 09/24/16 0209 09/24/16 0624  BP: 132/80 (!) 132/91 121/74 112/71  Pulse: 67 71 71 80  Resp: 17 16  19   Temp:  98.2 F (36.8 C)  97.9 F (36.6 C)  TempSrc:  Oral  Oral  SpO2: 100% 99% 100% 98%  Weight:      Height:        Intake/Output Summary (Last 24 hours) at 09/24/16 1321 Last data filed at 09/24/16 0216  Gross per 24 hour  Intake              300 ml  Output                0 ml  Net              300 ml   Filed Weights   09/20/16 2132  Weight: 62.6 kg (138 lb 1 oz)    Examination:  General exam: NAD Respiratory system: Clear to auscultation. Respiratory effort normal. Cardiovascular system: S1 & S2 heard, RRR. No JVD, murmurs, rubs, gallops or clicks. No pedal edema. Gastrointestinal system: Abdomen is nondistended, soft and nontender. No organomegaly or masses felt. Normal bowel sounds heard. Central nervous system: Alert and oriented. No focal neurological deficits. Extremities: Symmetric 5 x 5 power. Skin: No rashes, lesions or ulcers Psychiatry: Judgement and insight appear normal. Mood & affect appropriate.     Data Reviewed: I have personally reviewed following  labs and imaging studies  CBC:  Recent Labs Lab 09/20/16 1425 09/20/16 2151 09/21/16 0553 09/22/16 0541  WBC 8.5 10.5 11.2* 8.7  NEUTROABS 5.7 6.2 6.8 4.7  HGB 12.7 14.8 13.4 12.0  HCT 39.1 44.5 41.6 38.1  MCV 85.6 85.9 86.7 88.6  PLT 402 521* 463* 830   Basic Metabolic Panel:  Recent Labs Lab 09/20/16 2151 09/21/16 0553 09/22/16 0541 09/23/16 0519 09/24/16 0608  NA 136 135 140 137 135  K 3.3* 3.3* 3.8 3.6 3.9  CL 96* 99* 110 108 104  CO2 25 24 22 22 22     GLUCOSE 100* 83 100* 98 91  BUN 15 12 9 6 8   CREATININE 0.65 0.73 0.70 0.63 0.55  CALCIUM 8.8* 8.3* 8.3* 8.5* 9.2  MG  --  1.9  --  1.6* 2.0   GFR: Estimated Creatinine Clearance: 91.7 mL/min (by C-G formula based on SCr of 0.55 mg/dL). Liver Function Tests:  Recent Labs Lab 09/20/16 1425 09/20/16 2151 09/21/16 0553 09/22/16 0541  AST 61* 55* 53* 44*  ALT 52 60* 51 42  ALKPHOS 89 102 89 85  BILITOT 0.6 0.5 0.5 0.5  PROT 7.0 7.6 6.9 5.8*  ALBUMIN 3.4* 3.9 3.4* 2.8*   No results for input(s): LIPASE, AMYLASE in the last 168 hours. No results for input(s): AMMONIA in the last 168 hours. Coagulation Profile: No results for input(s): INR, PROTIME in the last 168 hours. Cardiac Enzymes: No results for input(s): CKTOTAL, CKMB, CKMBINDEX, TROPONINI in the last 168 hours. BNP (last 3 results) No results for input(s): PROBNP in the last 8760 hours. HbA1C: No results for input(s): HGBA1C in the last 72 hours. CBG: No results for input(s): GLUCAP in the last 168 hours. Lipid Profile: No results for input(s): CHOL, HDL, LDLCALC, TRIG, CHOLHDL, LDLDIRECT in the last 72 hours. Thyroid Function Tests: No results for input(s): TSH, T4TOTAL, FREET4, T3FREE, THYROIDAB in the last 72 hours. Anemia Panel: No results for input(s): VITAMINB12, FOLATE, FERRITIN, TIBC, IRON, RETICCTPCT in the last 72 hours. Sepsis Labs: No results for input(s): PROCALCITON, LATICACIDVEN in the last 168 hours.  No results found for this or any previous visit (from the past 240 hour(s)).       Radiology Studies: No results found.      Scheduled Meds: . folic acid  1 mg Oral Daily  . LORazepam  0-4 mg Oral Q12H  . multivitamin with minerals  1 tablet Oral Daily  . nicotine  21 mg Transdermal Daily  . thiamine  100 mg Oral Daily   Continuous Infusions:    LOS: 2 days    Time spent: 71 mins    Shante Archambeault, MD [facility] Pager [phone]  If 7PM-7AM, please contact  night-coverage [email or web address] Password [email or web address] 09/24/2016, 1:21 PM

## 2016-09-25 MED ORDER — CLONAZEPAM 0.5 MG PO TABS
0.5000 mg | ORAL_TABLET | Freq: Two times a day (BID) | ORAL | Status: DC
  Administered 2016-09-25: 0.5 mg via ORAL
  Filled 2016-09-25: qty 1

## 2016-09-25 MED ORDER — NICOTINE 21 MG/24HR TD PT24: 21.0000 mg | MEDICATED_PATCH | Freq: Every day | TRANSDERMAL | 0 refills | Status: DC

## 2016-09-25 NOTE — Progress Notes (Signed)
Duane Boston to be D/C'd Home per MD order.  Discussed with the patient and all questions fully answered.  VSS, Skin clean, dry and intact without evidence of skin break down, no evidence of skin tears noted. IV catheter discontinued intact. Site without signs and symptoms of complications. Dressing and pressure applied.  An After Visit Summary was printed and given to the patient. Patient received prescription.  D/c education completed with patient/family including follow up instructions, medication list, d/c activities limitations if indicated, with other d/c instructions as indicated by MD - patient able to verbalize understanding, all questions fully answered.   Patient instructed to return to ED, call 911, or call MD for any changes in condition.   Patient escorted via [place], and D/C home via private auto.  Dorris Carnes 09/25/2016 3:49 PM

## 2016-09-25 NOTE — Discharge Summary (Signed)
Physician Discharge Summary  Ndeye Tenorio ZOX:[id number] DOB: [birth year] DOA: 09/21/2016  PCP: No PCP Per Patient  Admit date: 09/21/2016 Discharge date: 09/25/2016  Time spent: 60 minutes  Recommendations for Outpatient Follow-up:  1. Follow-up with PCP in 1-2 weeks for hospital follow-up on alcohol withdrawal seizures.   Discharge Diagnoses:  Principal Problem:   Seizure due to alcohol withdrawal ([place]) Active Problems:   Generalized anxiety disorder   Alcohol abuse   Tobacco use   Alcohol withdrawal ([place])   Alcohol withdrawal syndrome with complication ([place])   Dehydration   Seizures ([place])   Discharge Condition: Stable and improved.  Diet recommendation: Regular  Filed Weights   09/20/16 2132  Weight: 62.6 kg (138 lb 1 oz)    History of present illness:  Per Dr Berton Mount Kristine Macias is a 31 y.o. female with history of alcoholism and anxiety disorder presented to the ER because patient's friend noticed patient had 3 episodes of seizures which was generalized tonic-clonic. Each episode lasted for less than a minute. Patient stated she was trying to cut down her alcohol intake One day prior to admission. On exam patient appearred nonfocal but was tachycardic with early signs of withdrawal. Patient also takes Klonopin for anxiety. Patient stated she does not take Klonopin on days which she drinks heavily. Patient was admitted recently for similar complaints  ED Course: Patient was started on CIWA protocol.  Hospital Course:  #1 Alcohol withdrawal seizures Patient was admitted on alcohol withdrawal seizures. Patient was placed on Ativan withdrawal protocol. CT head which was done was unremarkable. Chest x-ray negative. Alcohol level on admission was less than 5. Patient with no signs or symptoms of infection. Patient with recurrent admissions secondary to alcohol withdrawal seizures. Patient improved clinically while on the Ativan withdrawal protocol and had no  further withdrawal seizures noted. Patient was discharged home in stable condition and will follow-up with PCP in the outpatient setting.   #2 history of anxiety disorder Patient noted to be on Klonopin twice daily prior to admission. Klonopin was initially held during the hospitalization as patient was on the Ativan withdrawal protocol. Patient remained in stable condition. Patient be discharged home back on her home regimen of Klonopin twice daily.   #3 dehydration On admission patient noted to be dehydrated. Patient was hydrated with IV fluids and was euvolemic by day of discharge.  #4 tobacco abuse Tobacco cessation. Nicotine patch was placed.  #5 history of alcohol abuse Patient was trying to cut down on her alcohol intake when she had alcohol withdrawal seizures. Patient was maintained on the Ativan withdrawal protocol, folic acid, thiamine, multivitamin. Patient improved clinically on a daily basis and had no further withdrawals. Outpatient follow-up.   Procedures:  CT head 09/21/2016  Chest x-ray 09/20/2016   Consultations:  None  Discharge Exam: Vitals:   09/25/16 0943 09/25/16 1428  BP: 103/69 111/79  Pulse: 70 86  Resp:  17  Temp:  98 F (36.7 C)    General: NAD Cardiovascular: RRR Respiratory: CTAB  Discharge Instructions   Discharge Instructions    Diet general    Complete by:  As directed    Increase activity slowly    Complete by:  As directed      Current Discharge Medication List    START taking these medications   Details  nicotine (NICODERM CQ - DOSED IN MG/24 HOURS) 21 mg/24hr patch Place 1 patch (21 mg total) onto the skin daily. Qty: 28 patch, Refills: 0  CONTINUE these medications which have NOT CHANGED   Details  clonazePAM (KLONOPIN) 0.5 MG tablet Take 0.5 mg by mouth 2 (two) times daily. Pt does not take when she has been drinking    folic acid (FOLVITE) 1 MG tablet Take 1 tablet (1 mg total) by mouth daily. Qty: 30  tablet, Refills: 0    Multiple Vitamin (MULTIVITAMIN WITH MINERALS) TABS tablet Take 1 tablet by mouth daily. Qty: 30 tablet, Refills: 0    thiamine 100 MG tablet Take 1 tablet (100 mg total) by mouth daily. Qty: 30 tablet, Refills: 0      STOP taking these medications     ferrous sulfate 325 (65 FE) MG tablet        Allergies  Allergen Reactions  . Aspirin Shortness Of Breath  . Effexor [Venlafaxine] Anaphylaxis  . Gabapentin Anaphylaxis  . Sulfa Antibiotics Anaphylaxis  . Tetracyclines & Related Anaphylaxis  . Trazodone And Nefazodone Anaphylaxis  . Latex Hives and Itching  . Paxil [Paroxetine] Hives and Itching  . Seroquel [Quetiapine Fumarate] Hives and Itching  . Zoloft [Sertraline Hcl] Hives and Itching   Follow-up Information    PCP. Schedule an appointment as soon as possible for a visit in 1 week(s).   Why:  f/u with PCP in 1-2 weeks.           The results of significant diagnostics from this hospitalization (including imaging, microbiology, ancillary and laboratory) are listed below for reference.    Significant Diagnostic Studies: Dg Chest 2 View  Result Date: 09/20/2016 CLINICAL DATA:  Status post seizure. Productive cough, nausea and vomiting, acute onset. Initial encounter. EXAM: CHEST  2 VIEW COMPARISON:  Chest radiograph from 10/18/2015 FINDINGS: The lungs are well-aerated and clear. There is no evidence of focal opacification, pleural effusion or pneumothorax. The heart is normal in size; the mediastinal contour is within normal limits. No acute osseous abnormalities are seen. IMPRESSION: No acute cardiopulmonary process seen. Electronically Signed   By: Garald Balding M.D.   On: 09/20/2016 22:53   Ct Head Wo Contrast  Result Date: 09/21/2016 CLINICAL DATA:  Initial evaluation for acute seizure. EXAM: CT HEAD WITHOUT CONTRAST TECHNIQUE: Contiguous axial images were obtained from the base of the skull through the vertex without intravenous contrast.  COMPARISON:  Prior study from 01/13/2015. FINDINGS: Brain: Cerebral volume within normal limits. Gray-white matter differentiation maintained. No evidence for acute intracranial hemorrhage. No evidence for acute large vessel territory infarct. No mass lesion, midline shift or mass effect. No hydrocephalus. No extra-axial fluid collection. Vascular: No hyperdense vessel. Skull: Scalp soft tissues within normal limits.  Calvarium intact. Sinuses/Orbits: Globes and orbits within normal limits. Visualized paranasal sinuses and mastoids are clear. IMPRESSION: No acute intracranial process identified. Electronically Signed   By: Jeannine Boga M.D.   On: 09/21/2016 06:25    Microbiology: No results found for this or any previous visit (from the past 240 hour(s)).   Labs: Basic Metabolic Panel:  Recent Labs Lab 09/20/16 2151 09/21/16 0553 09/22/16 0541 09/23/16 0519 09/24/16 0608  NA 136 135 140 137 135  K 3.3* 3.3* 3.8 3.6 3.9  CL 96* 99* 110 108 104  CO2 25 24 22 22 22   GLUCOSE 100* 83 100* 98 91  BUN 15 12 9 6 8   CREATININE 0.65 0.73 0.70 0.63 0.55  CALCIUM 8.8* 8.3* 8.3* 8.5* 9.2  MG  --  1.9  --  1.6* 2.0   Liver Function Tests:  Recent Labs Lab 09/20/16 1425  09/20/16 2151 09/21/16 0553 09/22/16 0541  AST 61* 55* 53* 44*  ALT 52 60* 51 42  ALKPHOS 89 102 89 85  BILITOT 0.6 0.5 0.5 0.5  PROT 7.0 7.6 6.9 5.8*  ALBUMIN 3.4* 3.9 3.4* 2.8*   No results for input(s): LIPASE, AMYLASE in the last 168 hours. No results for input(s): AMMONIA in the last 168 hours. CBC:  Recent Labs Lab 09/20/16 1425 09/20/16 2151 09/21/16 0553 09/22/16 0541  WBC 8.5 10.5 11.2* 8.7  NEUTROABS 5.7 6.2 6.8 4.7  HGB 12.7 14.8 13.4 12.0  HCT 39.1 44.5 41.6 38.1  MCV 85.6 85.9 86.7 88.6  PLT 402 521* 463* 370   Cardiac Enzymes: No results for input(s): CKTOTAL, CKMB, CKMBINDEX, TROPONINI in the last 168 hours. BNP: BNP (last 3 results)  Recent Labs  10/04/15 2021  BNP 128.0*     ProBNP (last 3 results) No results for input(s): PROBNP in the last 8760 hours.  CBG: No results for input(s): GLUCAP in the last 168 hours.     SignedIrine Seal MD.  [facility] 09/25/2016, 3:15 PM

## 2016-10-28 ENCOUNTER — Encounter (HOSPITAL_COMMUNITY): Payer: Self-pay

## 2016-10-28 ENCOUNTER — Inpatient Hospital Stay (HOSPITAL_COMMUNITY)
Admission: EM | Admit: 2016-10-28 | Discharge: 2016-10-30 | DRG: 897 | Disposition: A | Discharge status: Home / Self Care | Payer: Medicaid Other | Attending: Internal Medicine | Admitting: Internal Medicine

## 2016-10-28 DIAGNOSIS — F1010 Alcohol abuse, uncomplicated: Secondary | ICD-10-CM | POA: Diagnosis present

## 2016-10-28 DIAGNOSIS — Z801 Family history of malignant neoplasm of trachea, bronchus and lung: Secondary | ICD-10-CM

## 2016-10-28 DIAGNOSIS — K760 Fatty (change of) liver, not elsewhere classified: Secondary | ICD-10-CM | POA: Diagnosis present

## 2016-10-28 DIAGNOSIS — F419 Anxiety disorder, unspecified: Secondary | ICD-10-CM | POA: Diagnosis present

## 2016-10-28 DIAGNOSIS — Z881 Allergy status to other antibiotic agents status: Secondary | ICD-10-CM

## 2016-10-28 DIAGNOSIS — Z888 Allergy status to other drugs, medicaments and biological substances status: Secondary | ICD-10-CM

## 2016-10-28 DIAGNOSIS — Y905 Blood alcohol level of 100-119 mg/100 ml: Secondary | ICD-10-CM | POA: Diagnosis present

## 2016-10-28 DIAGNOSIS — Z823 Family history of stroke: Secondary | ICD-10-CM

## 2016-10-28 DIAGNOSIS — F10239 Alcohol dependence with withdrawal, unspecified: Principal | ICD-10-CM | POA: Diagnosis present

## 2016-10-28 DIAGNOSIS — F17210 Nicotine dependence, cigarettes, uncomplicated: Secondary | ICD-10-CM | POA: Diagnosis present

## 2016-10-28 DIAGNOSIS — F411 Generalized anxiety disorder: Secondary | ICD-10-CM | POA: Diagnosis not present

## 2016-10-28 DIAGNOSIS — F10230 Alcohol dependence with withdrawal, uncomplicated: Secondary | ICD-10-CM | POA: Diagnosis not present

## 2016-10-28 DIAGNOSIS — H9042 Sensorineural hearing loss, unilateral, left ear, with unrestricted hearing on the contralateral side: Secondary | ICD-10-CM | POA: Diagnosis present

## 2016-10-28 DIAGNOSIS — F329 Major depressive disorder, single episode, unspecified: Secondary | ICD-10-CM | POA: Diagnosis present

## 2016-10-28 DIAGNOSIS — Z853 Personal history of malignant neoplasm of breast: Secondary | ICD-10-CM

## 2016-10-28 DIAGNOSIS — R569 Unspecified convulsions: Secondary | ICD-10-CM | POA: Diagnosis present

## 2016-10-28 DIAGNOSIS — Z91040 Latex allergy status: Secondary | ICD-10-CM

## 2016-10-28 DIAGNOSIS — Z886 Allergy status to analgesic agent status: Secondary | ICD-10-CM

## 2016-10-28 DIAGNOSIS — Z882 Allergy status to sulfonamides status: Secondary | ICD-10-CM

## 2016-10-28 DIAGNOSIS — Z803 Family history of malignant neoplasm of breast: Secondary | ICD-10-CM

## 2016-10-28 DIAGNOSIS — Z79899 Other long term (current) drug therapy: Secondary | ICD-10-CM

## 2016-10-28 LAB — CBC WITH DIFFERENTIAL/PLATELET
BASOS ABS: 0.1 10*3/uL (ref 0.0–0.1)
Basophils Relative: 1 %
Eosinophils Absolute: 0.1 10*3/uL (ref 0.0–0.7)
Eosinophils Relative: 1 %
HEMATOCRIT: 43.4 % (ref 36.0–46.0)
HEMOGLOBIN: 14.7 g/dL (ref 12.0–15.0)
LYMPHS PCT: 24 %
Lymphs Abs: 2.2 10*3/uL (ref 0.7–4.0)
MCH: 28.6 pg (ref 26.0–34.0)
MCHC: 33.9 g/dL (ref 30.0–36.0)
MCV: 84.4 fL (ref 78.0–100.0)
MONO ABS: 0.7 10*3/uL (ref 0.1–1.0)
MONOS PCT: 8 %
NEUTROS ABS: 6.2 10*3/uL (ref 1.7–7.7)
NEUTROS PCT: 66 %
Platelets: 354 10*3/uL (ref 150–400)
RBC: 5.14 MIL/uL — ABNORMAL HIGH (ref 3.87–5.11)
RDW: 15.1 % (ref 11.5–15.5)
WBC: 9.3 10*3/uL (ref 4.0–10.5)

## 2016-10-28 LAB — RAPID URINE DRUG SCREEN, HOSP PERFORMED
AMPHETAMINES: NOT DETECTED
BENZODIAZEPINES: POSITIVE — AB
Barbiturates: NOT DETECTED
Cocaine: NOT DETECTED
OPIATES: NOT DETECTED
Tetrahydrocannabinol: NOT DETECTED

## 2016-10-28 LAB — COMPREHENSIVE METABOLIC PANEL
ALK PHOS: 95 U/L (ref 38–126)
ALT: 58 U/L — ABNORMAL HIGH (ref 14–54)
AST: 58 U/L — AB (ref 15–41)
Albumin: 3.8 g/dL (ref 3.5–5.0)
Anion gap: 11 (ref 5–15)
BILIRUBIN TOTAL: 0.7 mg/dL (ref 0.3–1.2)
BUN: 17 mg/dL (ref 6–20)
CALCIUM: 8.1 mg/dL — AB (ref 8.9–10.3)
CO2: 26 mmol/L (ref 22–32)
CREATININE: 0.62 mg/dL (ref 0.44–1.00)
Chloride: 102 mmol/L (ref 101–111)
GFR calc Af Amer: >60 mL/min (ref >60)
GLUCOSE: 107 mg/dL — AB (ref 65–99)
POTASSIUM: 3.8 mmol/L (ref 3.5–5.1)
Sodium: 139 mmol/L (ref 135–145)
TOTAL PROTEIN: 7.4 g/dL (ref 6.5–8.1)

## 2016-10-28 LAB — ETHANOL: ALCOHOL ETHYL (B): 105 mg/dL — AB (ref <5)

## 2016-10-28 LAB — I-STAT BETA HCG BLOOD, ED (MC, WL, AP ONLY): I-stat hCG, quantitative: <5 m[IU]/mL (ref <5)

## 2016-10-28 MED ORDER — LORAZEPAM 1 MG PO TABS
1.0000 mg | ORAL_TABLET | Freq: Four times a day (QID) | ORAL | Status: DC | PRN
  Administered 2016-10-30: 1 mg via ORAL
  Filled 2016-10-28: qty 1

## 2016-10-28 MED ORDER — LORAZEPAM 2 MG/ML IJ SOLN
2.0000 mg | Freq: Once | INTRAMUSCULAR | Status: AC
  Administered 2016-10-28: 2 mg via INTRAVENOUS
  Filled 2016-10-28: qty 1

## 2016-10-28 MED ORDER — FOLIC ACID 1 MG PO TABS
1.0000 mg | ORAL_TABLET | Freq: Every day | ORAL | Status: DC
  Administered 2016-10-28 – 2016-10-30 (×3): 1 mg via ORAL
  Filled 2016-10-28 (×3): qty 1

## 2016-10-28 MED ORDER — ADULT MULTIVITAMIN W/MINERALS CH
1.0000 | ORAL_TABLET | Freq: Every day | ORAL | Status: DC
Start: 2016-10-28 — End: 2016-10-30
  Administered 2016-10-28 – 2016-10-30 (×3): 1 via ORAL
  Filled 2016-10-28 (×3): qty 1

## 2016-10-28 MED ORDER — LORAZEPAM 2 MG/ML IJ SOLN
1.0000 mg | Freq: Four times a day (QID) | INTRAMUSCULAR | Status: DC | PRN
  Administered 2016-10-29 (×3): 1 mg via INTRAVENOUS
  Filled 2016-10-28 (×3): qty 1

## 2016-10-28 MED ORDER — ONDANSETRON HCL 4 MG/2ML IJ SOLN
4.0000 mg | Freq: Four times a day (QID) | INTRAMUSCULAR | Status: DC | PRN
  Administered 2016-10-30: 4 mg via INTRAVENOUS
  Filled 2016-10-28: qty 2

## 2016-10-28 MED ORDER — THIAMINE HCL 100 MG/ML IJ SOLN
Freq: Once | INTRAVENOUS | Status: AC
  Administered 2016-10-28: 23:00:00 via INTRAVENOUS
  Filled 2016-10-28: qty 1000

## 2016-10-28 MED ORDER — ONDANSETRON HCL 4 MG PO TABS
4.0000 mg | ORAL_TABLET | Freq: Four times a day (QID) | ORAL | Status: DC | PRN
  Administered 2016-10-28 – 2016-10-29 (×2): 4 mg via ORAL
  Filled 2016-10-28 (×2): qty 1

## 2016-10-28 MED ORDER — SODIUM CHLORIDE 0.9 % IV SOLN
INTRAVENOUS | Status: DC
  Administered 2016-10-28: 21:00:00 via INTRAVENOUS

## 2016-10-28 MED ORDER — SODIUM CHLORIDE 0.9 % IV BOLUS (SEPSIS)
2000.0000 mL | Freq: Once | INTRAVENOUS | Status: AC
  Administered 2016-10-28: 2000 mL via INTRAVENOUS

## 2016-10-28 MED ORDER — LORAZEPAM 1 MG PO TABS: 0.0000 mg | ORAL_TABLET | Freq: Two times a day (BID) | ORAL | Status: DC

## 2016-10-28 MED ORDER — LORAZEPAM 1 MG PO TABS
0.0000 mg | ORAL_TABLET | Freq: Four times a day (QID) | ORAL | Status: DC
  Administered 2016-10-28: 1 mg via ORAL
  Administered 2016-10-29: 2 mg via ORAL
  Administered 2016-10-29: 1 mg via ORAL
  Administered 2016-10-29 (×2): 2 mg via ORAL
  Administered 2016-10-30: 1 mg via ORAL
  Administered 2016-10-30: 2 mg via ORAL
  Filled 2016-10-28 (×2): qty 2
  Filled 2016-10-28: qty 1
  Filled 2016-10-28: qty 2
  Filled 2016-10-28: qty 1
  Filled 2016-10-28 (×2): qty 2

## 2016-10-28 MED ORDER — VITAMIN B-1 100 MG PO TABS
100.0000 mg | ORAL_TABLET | Freq: Every day | ORAL | Status: DC
  Administered 2016-10-28 – 2016-10-30 (×3): 100 mg via ORAL
  Filled 2016-10-28 (×3): qty 1

## 2016-10-28 MED ORDER — CLONAZEPAM 0.5 MG PO TABS
0.5000 mg | ORAL_TABLET | Freq: Two times a day (BID) | ORAL | Status: DC
Start: 2016-10-28 — End: 2016-10-30
  Administered 2016-10-28 – 2016-10-30 (×4): 0.5 mg via ORAL
  Filled 2016-10-28 (×4): qty 1

## 2016-10-28 MED ORDER — SODIUM CHLORIDE 0.9 % IV SOLN
INTRAVENOUS | Status: DC
  Administered 2016-10-28 – 2016-10-29 (×4): via INTRAVENOUS

## 2016-10-28 MED ORDER — THIAMINE HCL 100 MG/ML IJ SOLN: 100.0000 mg | Freq: Every day | INTRAMUSCULAR | Status: DC

## 2016-10-28 NOTE — ED Notes (Signed)
Patient continues to have moderate tremors. Does verbalize that headache is almost completely resolved and the nausea has resolved. Will continue to closely monitor.

## 2016-10-28 NOTE — ED Triage Notes (Signed)
Pt came by ems with c/o 3 days ETOH detox and pt have not eat in threes days. Pt also complaining of nausea and vomiting. Pt had two seizures today 1 was in the morning and other seizure was this afternoon and they call EMS. NS 400 ml  given per ems. Pt last  etoh drink was last night 2 Lg can of beer.

## 2016-10-28 NOTE — H&P (Signed)
History and Physical    Kristine Macias IWL:[id number] DOB: [birth year] DOA: 10/28/2016  PCP: No PCP Per Patient  Patient coming from: home  Chief Complaint:  seizure  HPI: Kristine Macias is a 31 y.o. female with medical history significant of alcohol abuse with h/o withdrawal seizures comes in with several seizures today.  Pt was sober for about a month then drank last night, today was vomiting and could not hold down her klonopin and had 2 seizures.  No fevers.  No recent illnesses.  Pt referred for admission for her seizures.   Review of Systems: As per HPI otherwise 10 point review of systems negative.   Past Medical History:  Diagnosis Date  . Alcohol abuse 11/2014   drinking since age 85. chronic, recurrent. at least 2 detox admits before 2015.   Marland Kitchen Alcoholic hepatitis 08/7406   hepatic steatosis on 11/2014 ultrasound.   . Breast cancer [facility])    age 18, lump removed from left breast  . Chlamydia 2007   bacterial vaginosis 09/2011  . Colitis 12/2014   colonoscopy for diarrhea and abnml CT 01/06/15: erythmatous TI (path: active ileitis, ? emerging IBD), rectal erythema (path: mucosal prolapse). Random bx of normal colon (path unremarkable)   . Congenital deafness    left ear only.  Also noted in mother and sibling (unilateral).   . Depression with anxiety initally at age 52  . Failure to thrive in adult 12/2014.    Malnutrition: n/v, not eating, weight loss, BMI 14.  s/p 01/11/2015 PEG (Dr Dorna Leitz).   . Irregular heart beat 2010   wt/diet related after evaluation  . Pancreas divisum 02/2015   Type 1 seen on CT.   Marland Kitchen Pneumothorax, spontaneous, tension   . Renal disorder   . Seizure ([place]) 06/2015   due to ETOH/benzo withdrawal.   . Spontaneous pneumothorax 08/2012   right.  chest tube placed.   . Thrombocytopenia ([place]) 04/2007    Past Surgical History:  Procedure Laterality Date  . CESAREAN SECTION  07/2009    x 1.  G3, Para 2012.   . CHEST TUBE INSERTION    . COLONOSCOPY  WITH PROPOFOL N/A 01/06/2015   [facility], Dr Rayann Heman. colonoscopy for diarrhea and abnml CT 01/06/15: erythmatous TI (path: active ileitis, ? emerging IBD), rectal erythema (path: mucosal prolapse). Random bx of normal colon (path unremarkable)   . ESOPHAGOGASTRODUODENOSCOPY N/A 01/06/2015   ;[facility], Dr Rayann Heman. For wt loss, N/V: Normal study, duodenal biopsy/pathology: chronic active duodenitis.   Marland Kitchen ESOPHAGOGASTRODUODENOSCOPY N/A 01/11/2015   Rein-normal with PEG placement  . PEG PLACEMENT N/A 01/11/2015   [facility], Dr Rayann Heman.  for N/V/wt loss/severe malnutrition.      reports that she has been smoking Cigarettes.  She has a 13.00 pack-year smoking history. She has never used smokeless tobacco. She reports that she drinks about 21.6 oz of alcohol per week . She reports that she does not use drugs.  Allergies  Allergen Reactions  . Aspirin Shortness Of Breath  . Effexor [Venlafaxine] Anaphylaxis  . Gabapentin Anaphylaxis  . Sulfa Antibiotics Anaphylaxis  . Tetracyclines & Related Anaphylaxis  . Trazodone And Nefazodone Anaphylaxis  . Latex Hives and Itching  . Paxil [Paroxetine] Hives and Itching  . Seroquel [Quetiapine Fumarate] Hives and Itching  . Zoloft [Sertraline Hcl] Hives and Itching    Family History  Problem Relation Age of Onset  . Thyroid disease Mother   . Cancer Mother     breast cancer  . Cancer Father  lung cancer  . Stroke Other   . Crohn's disease Brother   . Cancer Maternal Grandmother     breast cancer  . Anesthesia problems Neg Hx     Prior to Admission medications   Medication Sig Start Date End Date Taking? Authorizing Provider  clonazePAM (KLONOPIN) 0.5 MG tablet Take 0.5 mg by mouth 2 (two) times daily. Pt does not take when she has been drinking    Historical Provider, MD  folic acid (FOLVITE) 1 MG tablet Take 1 tablet (1 mg total) by mouth daily. 08/06/16   Clanford Marisa Hua, MD  Multiple Vitamin (MULTIVITAMIN WITH MINERALS) TABS tablet Take 1 tablet by mouth daily.  08/07/16   Clanford Marisa Hua, MD  nicotine (NICODERM CQ - DOSED IN MG/24 HOURS) 21 mg/24hr patch Place 1 patch (21 mg total) onto the skin daily. 09/26/16   Eugenie Filler, MD  thiamine 100 MG tablet Take 1 tablet (100 mg total) by mouth daily. 08/06/16   Clanford Marisa Hua, MD    Physical Exam: Vitals:   10/28/16 1905 10/28/16 1908 10/28/16 1929 10/28/16 2011  BP: (!) 142/102  128/100 130/98  Pulse: 100  98 97  Resp: 18  20 20   Temp: 98.5 F (36.9 C)     TempSrc: Oral     SpO2: 99%  99% 99%  Weight: 62.6 kg (138 lb) 62.6 kg (138 lb)    Height:  5' 5"  (1.651 m)      Constitutional: NAD, calm, comfortable Vitals:   10/28/16 1905 10/28/16 1908 10/28/16 1929 10/28/16 2011  BP: (!) 142/102  128/100 130/98  Pulse: 100  98 97  Resp: 18  20 20   Temp: 98.5 F (36.9 C)     TempSrc: Oral     SpO2: 99%  99% 99%  Weight: 62.6 kg (138 lb) 62.6 kg (138 lb)    Height:  5' 5"  (1.651 m)     Eyes: PERRL, lids and conjunctivae normal ENMT: Mucous membranes are moist. Posterior pharynx clear of any exudate or lesions.Normal dentition.  Neck: normal, supple, no masses, no thyromegaly Respiratory: clear to auscultation bilaterally, no wheezing, no crackles. Normal respiratory effort. No accessory muscle use.  Cardiovascular: Regular rate and rhythm, no murmurs / rubs / gallops. No extremity edema. 2+ pedal pulses. No carotid bruits.  Abdomen: no tenderness, no masses palpated. No hepatosplenomegaly. Bowel sounds positive.  Musculoskeletal: no clubbing / cyanosis. No joint deformity upper and lower extremities. Good ROM, no contractures. Normal muscle tone.  Skin: no rashes, lesions, ulcers. No induration Neurologic: CN 2-12 grossly intact. Sensation intact, DTR normal. Strength 5/5 in all 4.  Psychiatric: Normal judgment and insight. Alert and oriented x 3. Normal mood.    Labs on Admission: I have personally reviewed following labs and imaging studies  CBC:  Recent Labs Lab  10/28/16 1921  WBC 9.3  NEUTROABS 6.2  HGB 14.7  HCT 43.4  MCV 84.4  PLT 785   Basic Metabolic Panel:  Recent Labs Lab 10/28/16 1921  NA 139  K 3.8  CL 102  CO2 26  GLUCOSE 107*  BUN 17  CREATININE 0.62  CALCIUM 8.1*   GFR: Estimated Creatinine Clearance: 91.7 mL/min (by C-G formula based on SCr of 0.62 mg/dL). Liver Function Tests:  Recent Labs Lab 10/28/16 1921  AST 58*  ALT 58*  ALKPHOS 95  BILITOT 0.7  PROT 7.4  ALBUMIN 3.8    Radiological Exams on Admission: No results found.   Assessment/Plan 31 yo  female with seizure due to etoh/benzo withdrawal  Principal Problem:   Seizure due to alcohol withdrawal ([place])- also probably a component of benzo withdrawal also.  Place on ciwa, give ativan.  Resume klonopin if tolerates po.  obs on tele bed with seizure precautions.  Active Problems:   Generalized anxiety disorder- noted   Alcohol abuse- as above    DVT prophylaxis:  scds  Code Status:  Full Family Communication:  none  Disposition Plan:  Per day team Consults called:  none Admission status:  observation   Danelia Snodgrass A MD [facility]  If 7PM-7AM, please contact night-coverage [email or web address] Password [facility]  10/28/2016, 8:54 PM
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
## 2016-10-29 DIAGNOSIS — F1010 Alcohol abuse, uncomplicated: Secondary | ICD-10-CM

## 2016-10-29 DIAGNOSIS — F419 Anxiety disorder, unspecified: Secondary | ICD-10-CM | POA: Diagnosis present

## 2016-10-29 DIAGNOSIS — F10239 Alcohol dependence with withdrawal, unspecified: Secondary | ICD-10-CM | POA: Diagnosis not present

## 2016-10-29 DIAGNOSIS — F329 Major depressive disorder, single episode, unspecified: Secondary | ICD-10-CM | POA: Diagnosis present

## 2016-10-29 DIAGNOSIS — K760 Fatty (change of) liver, not elsewhere classified: Secondary | ICD-10-CM | POA: Diagnosis present

## 2016-10-29 DIAGNOSIS — H9042 Sensorineural hearing loss, unilateral, left ear, with unrestricted hearing on the contralateral side: Secondary | ICD-10-CM | POA: Diagnosis present

## 2016-10-29 DIAGNOSIS — F10230 Alcohol dependence with withdrawal, uncomplicated: Secondary | ICD-10-CM

## 2016-10-29 DIAGNOSIS — Z881 Allergy status to other antibiotic agents status: Secondary | ICD-10-CM | POA: Diagnosis not present

## 2016-10-29 DIAGNOSIS — Z823 Family history of stroke: Secondary | ICD-10-CM | POA: Diagnosis not present

## 2016-10-29 DIAGNOSIS — R569 Unspecified convulsions: Secondary | ICD-10-CM | POA: Diagnosis not present

## 2016-10-29 DIAGNOSIS — Z91040 Latex allergy status: Secondary | ICD-10-CM | POA: Diagnosis not present

## 2016-10-29 DIAGNOSIS — Z888 Allergy status to other drugs, medicaments and biological substances status: Secondary | ICD-10-CM | POA: Diagnosis not present

## 2016-10-29 DIAGNOSIS — Z882 Allergy status to sulfonamides status: Secondary | ICD-10-CM | POA: Diagnosis not present

## 2016-10-29 DIAGNOSIS — F411 Generalized anxiety disorder: Secondary | ICD-10-CM

## 2016-10-29 DIAGNOSIS — Z801 Family history of malignant neoplasm of trachea, bronchus and lung: Secondary | ICD-10-CM | POA: Diagnosis not present

## 2016-10-29 DIAGNOSIS — F17210 Nicotine dependence, cigarettes, uncomplicated: Secondary | ICD-10-CM | POA: Diagnosis present

## 2016-10-29 DIAGNOSIS — Z886 Allergy status to analgesic agent status: Secondary | ICD-10-CM | POA: Diagnosis not present

## 2016-10-29 DIAGNOSIS — Z79899 Other long term (current) drug therapy: Secondary | ICD-10-CM | POA: Diagnosis not present

## 2016-10-29 DIAGNOSIS — Z853 Personal history of malignant neoplasm of breast: Secondary | ICD-10-CM | POA: Diagnosis not present

## 2016-10-29 DIAGNOSIS — Z803 Family history of malignant neoplasm of breast: Secondary | ICD-10-CM | POA: Diagnosis not present

## 2016-10-29 DIAGNOSIS — Y905 Blood alcohol level of 100-119 mg/100 ml: Secondary | ICD-10-CM | POA: Diagnosis present

## 2016-10-29 LAB — BASIC METABOLIC PANEL
Anion gap: 9 (ref 5–15)
BUN: 13 mg/dL (ref 6–20)
CHLORIDE: 107 mmol/L (ref 101–111)
CO2: 22 mmol/L (ref 22–32)
CREATININE: 0.65 mg/dL (ref 0.44–1.00)
Calcium: 7.3 mg/dL — ABNORMAL LOW (ref 8.9–10.3)
GFR calc Af Amer: >60 mL/min (ref >60)
GFR calc non Af Amer: >60 mL/min (ref >60)
Glucose, Bld: 84 mg/dL (ref 65–99)
POTASSIUM: 4.2 mmol/L (ref 3.5–5.1)
SODIUM: 138 mmol/L (ref 135–145)

## 2016-10-29 LAB — CBC
HEMATOCRIT: 38.2 % (ref 36.0–46.0)
Hemoglobin: 12.3 g/dL (ref 12.0–15.0)
MCH: 27.8 pg (ref 26.0–34.0)
MCHC: 32.2 g/dL (ref 30.0–36.0)
MCV: 86.2 fL (ref 78.0–100.0)
Platelets: 262 10*3/uL (ref 150–400)
RBC: 4.43 MIL/uL (ref 3.87–5.11)
RDW: 15.4 % (ref 11.5–15.5)
WBC: 10.4 10*3/uL (ref 4.0–10.5)

## 2016-10-29 LAB — MRSA PCR SCREENING: MRSA by PCR: NEGATIVE

## 2016-10-29 NOTE — Progress Notes (Signed)
[facility] PROGRESS NOTE    Progress Note  Kristine Macias  JHE:[id number] DOB: [birth year] DOA: 10/28/2016 PCP: No PCP Per Patient     Brief Narrative:   Kristine Macias is an 31 y.o. female past medical history significant for alcohol abuse with withdrawal seizures comes in today for several seizures. She laid she has been sober for a month her last drink was today prior to admission. She started vomiting on the day of admission and cannot keep down her Klonopin  Assessment/Plan:   Seizure due to alcohol withdrawal [facility]): Continue Ativan protocol for alcohol withdrawals.Her last drink was a day prior to admission. She's had no seizures overnight. No signs of seizures. Discontinue telemetry.  Generalized anxiety disorder Continue Klonopin twice a day.   Alcohol abuse Counseling.   DVT prophylaxis: SCd Family Communication:none Disposition Plan/Barrier to D/C: home in 1 days Code Status:     Code Status Orders        Start     Ordered   10/28/16 2143  Full code  Continuous     10/28/16 2142    Code Status History    Date Active Date Inactive Code Status Order ID Comments User Context   09/21/2016  5:05 AM 09/25/2016  8:55 PM Full Code [id number]  Rise Patience, MD ED   09/20/2016  2:13 PM 09/20/2016  7:59 PM Full Code [id number]  Nena Polio, MD ED   08/30/2016  6:48 AM 09/04/2016 10:59 PM Full Code [id number]  Toy Baker, MD Inpatient   08/04/2016 12:11 AM 08/06/2016  5:33 PM Full Code [id number]  Norval Morton, MD Inpatient   08/03/2016 10:51 PM 08/04/2016 12:11 AM Full Code [id number]  Antonietta Breach, PA-C ED   01/25/2016  3:39 PM 02/02/2016  8:16 PM Full Code [id number]  Eugenie Filler, MD Inpatient   12/30/2015 11:22 PM 01/02/2016  4:06 PM Full Code [id number]  Theressa Millard, MD Inpatient   12/30/2015  7:23 PM 12/30/2015 11:22 PM Full Code [id number]  Clayton Bibles, PA-C ED   12/21/2015  1:21 AM 12/23/2015  6:09 PM Full Code [id number]  Sylvan Cheese, MD ED    12/20/2015  7:40 PM 12/21/2015  1:21 AM Full Code [id number]  Orbie Pyo, MD ED   12/16/2015  1:42 AM 12/16/2015  5:10 PM Full Code [id number]  Merryl Hacker, MD ED   12/08/2015 12:39 AM 12/10/2015  5:18 PM Full Code [id number]  Fritzi Mandes, MD Inpatient   10/14/2015  8:31 PM 10/22/2015  8:29 PM Full Code [id number]  Vaughan Basta, MD Inpatient   10/05/2015  3:34 AM 10/05/2015  5:53 PM Full Code [id number]  Lance Coon, MD ED   08/08/2015  5:52 PM 08/10/2015  8:21 PM Full Code [id number]  Elmarie Shiley, MD Inpatient   07/09/2015  6:11 PM 07/14/2015  5:56 PM Full Code [id number]  Theodis Blaze, MD Inpatient   07/01/2015 12:32 AM 07/02/2015  5:52 PM Full Code [id number]  Lytle Butte, MD ED   06/21/2015 12:25 PM 06/22/2015  4:08 PM Full Code [id number]  Epifanio Lesches, MD ED   05/20/2015  7:56 PM 05/23/2015  3:55 PM Full Code [id number]  Fritzi Mandes, MD Inpatient   04/16/2015  9:21 PM 04/18/2015  4:55 PM Full Code [id number]  Idelle Crouch, MD Inpatient   03/01/2015  8:52 PM 03/03/2015  4:05 PM Full Code [id number]  Lytle Butte, MD ED   01/04/2015  7:23 PM 01/13/2015  7:12 PM  Full Code [id number]  Fritzi Mandes, MD ED   11/24/2014  6:21 PM 11/26/2014  1:29 PM Full Code [id number]  Serita Grit, MD ED        IV Access:    Peripheral IV   Procedures and diagnostic studies:   No results found.   Medical Consultants:    None.  Anti-Infectives:   None  Subjective:    Kristine Macias she has no new complains. No seizures overnight.  Objective:    Vitals:   10/28/16 2011 10/28/16 2101 10/28/16 2142 10/29/16 0342  BP: 130/98 152/89 (!) 153/102 128/82  Pulse: 97 88 94 93  Resp: 20 18 16 18   Temp:   98.4 F (36.9 C) 98.2 F (36.8 C)  TempSrc:   Oral Oral  SpO2: 99% 98% 98% 96%  Weight:   64.1 kg (141 lb 4.8 oz) 64.1 kg (141 lb 4.3 oz)  Height:   5' 5"  (1.651 m)     Intake/Output Summary (Last 24 hours) at 10/29/16 0810 Last data filed at 10/29/16 0600  Gross per 24  hour  Intake             1510 ml  Output                0 ml  Net             1510 ml   Filed Weights   10/28/16 1908 10/28/16 2142 10/29/16 0342  Weight: 62.6 kg (138 lb) 64.1 kg (141 lb 4.8 oz) 64.1 kg (141 lb 4.3 oz)    Exam: General exam: In no acute distress. Respiratory system: Good air movement and clear to auscultation. Cardiovascular system: S1 & S2 heard, RRR. No JVD, murmurs, rubs, gallops or clicks.  Gastrointestinal system: Abdomen is nondistended, soft and nontender.  Central nervous system: Alert and oriented. No focal neurological deficits. Extremities: No pedal edema. Skin: No rashes, lesions or ulcers Psychiatry: Judgement and insight appear normal. Mood & affect appropriate.    Data Reviewed:    Labs: Basic Metabolic Panel:  Recent Labs Lab 10/28/16 1921 10/29/16 0527  NA 139 138  K 3.8 4.2  CL 102 107  CO2 26 22  GLUCOSE 107* 84  BUN 17 13  CREATININE 0.62 0.65  CALCIUM 8.1* 7.3*   GFR Estimated Creatinine Clearance: 91.7 mL/min (by C-G formula based on SCr of 0.65 mg/dL). Liver Function Tests:  Recent Labs Lab 10/28/16 1921  AST 58*  ALT 58*  ALKPHOS 95  BILITOT 0.7  PROT 7.4  ALBUMIN 3.8   No results for input(s): LIPASE, AMYLASE in the last 168 hours. No results for input(s): AMMONIA in the last 168 hours. Coagulation profile No results for input(s): INR, PROTIME in the last 168 hours.  CBC:  Recent Labs Lab 10/28/16 1921 10/29/16 0527  WBC 9.3 10.4  NEUTROABS 6.2  --   HGB 14.7 12.3  HCT 43.4 38.2  MCV 84.4 86.2  PLT 354 262   Cardiac Enzymes: No results for input(s): CKTOTAL, CKMB, CKMBINDEX, TROPONINI in the last 168 hours. BNP (last 3 results) No results for input(s): PROBNP in the last 8760 hours. CBG: No results for input(s): GLUCAP in the last 168 hours. D-Dimer: No results for input(s): DDIMER in the last 72 hours. Hgb A1c: No results for input(s): HGBA1C in the last 72 hours. Lipid Profile: No results  for input(s): CHOL, HDL, LDLCALC, TRIG, CHOLHDL, LDLDIRECT in the last 72 hours. Thyroid function studies: No results for input(s): TSH,  T4TOTAL, T3FREE, THYROIDAB in the last 72 hours.  Invalid input(s): FREET3 Anemia work up: No results for input(s): VITAMINB12, FOLATE, FERRITIN, TIBC, IRON, RETICCTPCT in the last 72 hours. Sepsis Labs:  Recent Labs Lab 10/28/16 1921 10/29/16 0527  WBC 9.3 10.4   Microbiology Recent Results (from the past 240 hour(s))  MRSA PCR Screening     Status: None   Collection Time: 10/28/16 10:11 PM  Result Value Ref Range Status   MRSA by PCR NEGATIVE NEGATIVE Final    Comment:        The GeneXpert MRSA Assay (FDA approved for NASAL specimens only), is one component of a comprehensive MRSA colonization surveillance program. It is not intended to diagnose MRSA infection nor to guide or monitor treatment for MRSA infections.      Medications:   . clonazePAM  0.5 mg Oral BID  . folic acid  1 mg Oral Daily  . LORazepam  0-4 mg Oral Q6H   Followed by  . [START ON 10/31/2016] LORazepam  0-4 mg Oral Q12H  . multivitamin with minerals  1 tablet Oral Daily  . thiamine  100 mg Oral Daily   Or  . thiamine  100 mg Intravenous Daily   Continuous Infusions: . sodium chloride 75 mL/hr at 10/29/16 0149    Time spent: 25 min   LOS: 0 days   Charlynne Cousins  [facility] Pager [phone]  *Please refer to [email or web address], password [email or web address] to get updated schedule on who will round on this patient, as hospitalists switch teams weekly. If 7PM-7AM, please contact night-coverage at [email or web address], password [email or web address] for any overnight needs.  10/29/2016, 8:10 AM
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
## 2016-10-30 DIAGNOSIS — R569 Unspecified convulsions: Secondary | ICD-10-CM

## 2016-10-30 NOTE — Discharge Summary (Signed)
Physician Discharge Summary  Kristine Macias [name] RSW:[id number] DOB: [birth year] DOA: 10/28/2016  PCP: No PCP Per Patient  Admit date: 10/28/2016 Discharge date: 10/30/2016  Admitted From: home Disposition:  Home  Recommendations for Outpatient Follow-up:  1. Follow up with PCP in 1-2 weeks  Home Health:No Equipment/Devices:none  Discharge Condition:stable CODE STATUS:full Diet recommendation: Heart Healthy  Brief/Interim Summary: 31 y.o. female past medical history significant for alcohol abuse with withdrawal seizures comes in today for several seizures. She laid she has been sober for a month her last drink was today prior to admission. She started vomiting on the day of admission and cannot keep down her Klonopin  Discharge Diagnoses:  Principal Problem:   Seizure due to alcohol withdrawal ([place]) Active Problems:   Generalized anxiety disorder   Alcohol abuse   Seizure ([place])  Seizures due to alcohol withdrawal: She was started on the Ativan protocol with no signs of withdrawal, her last drink was the day prior to admission she had no seizures overnight she was discharged in stable condition.  General inside a disorder: Continue Klonopin twice a day.  Alcohol abuse: She was counseled.  Discharge Instructions  Discharge Instructions    Diet - low sodium heart healthy    Complete by:  As directed    Increase activity slowly    Complete by:  As directed      Allergies as of 10/30/2016      Reactions   Aspirin Shortness Of Breath   Effexor [venlafaxine] Anaphylaxis   Gabapentin Anaphylaxis   Sulfa Antibiotics Anaphylaxis   Tetracyclines & Related Anaphylaxis   Trazodone And Nefazodone Anaphylaxis   Latex Hives, Itching   Paxil [paroxetine] Hives, Itching   Seroquel [quetiapine Fumarate] Hives, Itching   Zoloft [sertraline Hcl] Hives, Itching      Medication List    TAKE these medications   clonazePAM 0.5 MG tablet Commonly known as:  KLONOPIN Take 0.5 mg by  mouth 2 (two) times daily. Pt does not take when she has been drinking   folic acid 1 MG tablet Commonly known as:  FOLVITE Take 1 tablet (1 mg total) by mouth daily.   multivitamin with minerals Tabs tablet Take 1 tablet by mouth daily.   nicotine 21 mg/24hr patch Commonly known as:  NICODERM CQ - dosed in mg/24 hours Place 1 patch (21 mg total) onto the skin daily.   ondansetron 4 MG tablet Commonly known as:  ZOFRAN Take 1 tablet by mouth every 6 (six) hours as needed for nausea/vomiting.   thiamine 100 MG tablet Take 1 tablet (100 mg total) by mouth daily.       Allergies  Allergen Reactions  . Aspirin Shortness Of Breath  . Effexor [Venlafaxine] Anaphylaxis  . Gabapentin Anaphylaxis  . Sulfa Antibiotics Anaphylaxis  . Tetracyclines & Related Anaphylaxis  . Trazodone And Nefazodone Anaphylaxis  . Latex Hives and Itching  . Paxil [Paroxetine] Hives and Itching  . Seroquel [Quetiapine Fumarate] Hives and Itching  . Zoloft [Sertraline Hcl] Hives and Itching    Consultations:  None   Procedures/Studies:  No results found.  Subjective: No complains  Discharge Exam: Vitals:   10/29/16 1556 10/30/16 0005  BP:  135/87  Pulse:  91  Resp:  18  Temp: 98.2 F (36.8 C) 98.2 F (36.8 C)   Vitals:   10/29/16 0342 10/29/16 1456 10/29/16 1556 10/30/16 0005  BP: 128/82 (!) 136/91  135/87  Pulse: 93 86  91  Resp: 18 18  18  Temp: 98.2 F (36.8 C)  98.2 F (36.8 C) 98.2 F (36.8 C)  TempSrc: Oral  Oral Oral  SpO2: 96% 96%  99%  Weight: 64.1 kg (141 lb 4.3 oz)     Height:        General: Pt is alert, awake, not in acute distress Cardiovascular: RRR, S1/S2 +, no rubs, no gallops Respiratory: CTA bilaterally, no wheezing, no rhonchi Abdominal: Soft, NT, ND, bowel sounds + Extremities: no edema, no cyanosis    The results of significant diagnostics from this hospitalization (including imaging, microbiology, ancillary and laboratory) are listed below for  reference.     Microbiology: Recent Results (from the past 240 hour(s))  MRSA PCR Screening     Status: None   Collection Time: 10/28/16 10:11 PM  Result Value Ref Range Status   MRSA by PCR NEGATIVE NEGATIVE Final    Comment:        The GeneXpert MRSA Assay (FDA approved for NASAL specimens only), is one component of a comprehensive MRSA colonization surveillance program. It is not intended to diagnose MRSA infection nor to guide or monitor treatment for MRSA infections.      Labs: BNP (last 3 results) No results for input(s): BNP in the last 8760 hours. Basic Metabolic Panel:  Recent Labs Lab 10/28/16 1921 10/29/16 0527  NA 139 138  K 3.8 4.2  CL 102 107  CO2 26 22  GLUCOSE 107* 84  BUN 17 13  CREATININE 0.62 0.65  CALCIUM 8.1* 7.3*   Liver Function Tests:  Recent Labs Lab 10/28/16 1921  AST 58*  ALT 58*  ALKPHOS 95  BILITOT 0.7  PROT 7.4  ALBUMIN 3.8   No results for input(s): LIPASE, AMYLASE in the last 168 hours. No results for input(s): AMMONIA in the last 168 hours. CBC:  Recent Labs Lab 10/28/16 1921 10/29/16 0527  WBC 9.3 10.4  NEUTROABS 6.2  --   HGB 14.7 12.3  HCT 43.4 38.2  MCV 84.4 86.2  PLT 354 262   Cardiac Enzymes: No results for input(s): CKTOTAL, CKMB, CKMBINDEX, TROPONINI in the last 168 hours. BNP: Invalid input(s): POCBNP CBG: No results for input(s): GLUCAP in the last 168 hours. D-Dimer No results for input(s): DDIMER in the last 72 hours. Hgb A1c No results for input(s): HGBA1C in the last 72 hours. Lipid Profile No results for input(s): CHOL, HDL, LDLCALC, TRIG, CHOLHDL, LDLDIRECT in the last 72 hours. Thyroid function studies No results for input(s): TSH, T4TOTAL, T3FREE, THYROIDAB in the last 72 hours.  Invalid input(s): FREET3 Anemia work up No results for input(s): VITAMINB12, FOLATE, FERRITIN, TIBC, IRON, RETICCTPCT in the last 72 hours. Urinalysis    Component Value Date/Time   COLORURINE YELLOW  09/20/2016 2152   APPEARANCEUR HAZY (A) 09/20/2016 2152   APPEARANCEUR Clear 02/15/2016 1456   LABSPEC 1.025 09/20/2016 2152   LABSPEC 1.006 12/20/2014 1018   PHURINE 7.0 09/20/2016 2152   GLUCOSEU NEGATIVE 09/20/2016 2152   GLUCOSEU Negative 12/20/2014 1018   HGBUR NEGATIVE 09/20/2016 2152   BILIRUBINUR NEGATIVE 09/20/2016 2152   BILIRUBINUR neg 03/13/2016 1700   BILIRUBINUR Negative 02/15/2016 1456   BILIRUBINUR Negative 12/20/2014 1018   KETONESUR 5 (A) 09/20/2016 2152   PROTEINUR 30 (A) 09/20/2016 2152   UROBILINOGEN negative 03/13/2016 1700   UROBILINOGEN 0.2 06/13/2012 2306   NITRITE NEGATIVE 09/20/2016 2152   LEUKOCYTESUR NEGATIVE 09/20/2016 2152   LEUKOCYTESUR Negative 02/15/2016 1456   LEUKOCYTESUR Negative 12/20/2014 1018   Sepsis Labs Invalid input(s): PROCALCITONIN,  WBC,  LACTICIDVEN Microbiology Recent Results (from the past 240 hour(s))  MRSA PCR Screening     Status: None   Collection Time: 10/28/16 10:11 PM  Result Value Ref Range Status   MRSA by PCR NEGATIVE NEGATIVE Final    Comment:        The GeneXpert MRSA Assay (FDA approved for NASAL specimens only), is one component of a comprehensive MRSA colonization surveillance program. It is not intended to diagnose MRSA infection nor to guide or monitor treatment for MRSA infections.      Time coordinating discharge: Over 30 minutes  SIGNED:   Charlynne Cousins, MD  [facility] 10/30/2016, 10:57 AM Pager   If 7PM-7AM, please contact night-coverage [email or web address] Password [email or web address]

## 2016-10-30 NOTE — Progress Notes (Signed)
Patient is stable for discharge. Discharge instructions and medications have been reviewed with the patient and all questions answered. Provided patient with Child Custody paperwork left by CSW, as patient was in the bathroom when she came by.

## 2016-11-08 ENCOUNTER — Encounter: Payer: Self-pay | Admitting: Obstetrics and Gynecology

## 2016-11-11 ENCOUNTER — Encounter (HOSPITAL_COMMUNITY): Payer: Self-pay

## 2016-11-11 ENCOUNTER — Inpatient Hospital Stay (HOSPITAL_COMMUNITY)
Admission: EM | Admit: 2016-11-11 | Discharge: 2016-11-19 | DRG: 897 | Disposition: A | Discharge status: Home / Self Care | Payer: Medicaid Other | Attending: Internal Medicine | Admitting: Internal Medicine

## 2016-11-11 DIAGNOSIS — Z91040 Latex allergy status: Secondary | ICD-10-CM | POA: Diagnosis not present

## 2016-11-11 DIAGNOSIS — D72829 Elevated white blood cell count, unspecified: Secondary | ICD-10-CM | POA: Diagnosis present

## 2016-11-11 DIAGNOSIS — F329 Major depressive disorder, single episode, unspecified: Secondary | ICD-10-CM | POA: Diagnosis present

## 2016-11-11 DIAGNOSIS — F411 Generalized anxiety disorder: Secondary | ICD-10-CM | POA: Diagnosis not present

## 2016-11-11 DIAGNOSIS — F10239 Alcohol dependence with withdrawal, unspecified: Secondary | ICD-10-CM | POA: Diagnosis not present

## 2016-11-11 DIAGNOSIS — F10232 Alcohol dependence with withdrawal with perceptual disturbance: Principal | ICD-10-CM | POA: Diagnosis present

## 2016-11-11 DIAGNOSIS — Z801 Family history of malignant neoplasm of trachea, bronchus and lung: Secondary | ICD-10-CM

## 2016-11-11 DIAGNOSIS — R569 Unspecified convulsions: Secondary | ICD-10-CM | POA: Diagnosis present

## 2016-11-11 DIAGNOSIS — F1010 Alcohol abuse, uncomplicated: Secondary | ICD-10-CM | POA: Diagnosis present

## 2016-11-11 DIAGNOSIS — D751 Secondary polycythemia: Secondary | ICD-10-CM | POA: Diagnosis present

## 2016-11-11 DIAGNOSIS — H9192 Unspecified hearing loss, left ear: Secondary | ICD-10-CM | POA: Diagnosis present

## 2016-11-11 DIAGNOSIS — Z888 Allergy status to other drugs, medicaments and biological substances status: Secondary | ICD-10-CM

## 2016-11-11 DIAGNOSIS — K7010 Alcoholic hepatitis without ascites: Secondary | ICD-10-CM | POA: Diagnosis present

## 2016-11-11 DIAGNOSIS — Z79899 Other long term (current) drug therapy: Secondary | ICD-10-CM

## 2016-11-11 DIAGNOSIS — Z853 Personal history of malignant neoplasm of breast: Secondary | ICD-10-CM

## 2016-11-11 DIAGNOSIS — Z8349 Family history of other endocrine, nutritional and metabolic diseases: Secondary | ICD-10-CM

## 2016-11-11 DIAGNOSIS — D649 Anemia, unspecified: Secondary | ICD-10-CM | POA: Diagnosis present

## 2016-11-11 DIAGNOSIS — F10939 Alcohol use, unspecified with withdrawal, unspecified: Secondary | ICD-10-CM | POA: Diagnosis present

## 2016-11-11 DIAGNOSIS — I160 Hypertensive urgency: Secondary | ICD-10-CM | POA: Diagnosis not present

## 2016-11-11 DIAGNOSIS — F17210 Nicotine dependence, cigarettes, uncomplicated: Secondary | ICD-10-CM | POA: Diagnosis present

## 2016-11-11 DIAGNOSIS — Z803 Family history of malignant neoplasm of breast: Secondary | ICD-10-CM | POA: Diagnosis not present

## 2016-11-11 DIAGNOSIS — Z823 Family history of stroke: Secondary | ICD-10-CM

## 2016-11-11 DIAGNOSIS — F10230 Alcohol dependence with withdrawal, uncomplicated: Secondary | ICD-10-CM | POA: Diagnosis not present

## 2016-11-11 HISTORY — DX: Hypertensive urgency: I16.0

## 2016-11-11 LAB — CBC WITH DIFFERENTIAL/PLATELET
BASOS ABS: 0 10*3/uL (ref 0.0–0.1)
Basophils Relative: 0 %
Eosinophils Absolute: 0 10*3/uL (ref 0.0–0.7)
Eosinophils Relative: 0 %
HEMATOCRIT: 46.4 % — AB (ref 36.0–46.0)
Hemoglobin: 15.6 g/dL — ABNORMAL HIGH (ref 12.0–15.0)
LYMPHS ABS: 2.2 10*3/uL (ref 0.7–4.0)
LYMPHS PCT: 20 %
MCH: 29.2 pg (ref 26.0–34.0)
MCHC: 33.6 g/dL (ref 30.0–36.0)
MCV: 86.9 fL (ref 78.0–100.0)
MONO ABS: 0.7 10*3/uL (ref 0.1–1.0)
Monocytes Relative: 7 %
Neutro Abs: 7.8 10*3/uL — ABNORMAL HIGH (ref 1.7–7.7)
Neutrophils Relative %: 73 %
Platelets: 299 10*3/uL (ref 150–400)
RBC: 5.34 MIL/uL — AB (ref 3.87–5.11)
RDW: 16.2 % — ABNORMAL HIGH (ref 11.5–15.5)
WBC: 10.7 10*3/uL — AB (ref 4.0–10.5)

## 2016-11-11 LAB — COMPREHENSIVE METABOLIC PANEL
ALK PHOS: 102 U/L (ref 38–126)
ALT: 93 U/L — AB (ref 14–54)
AST: 89 U/L — ABNORMAL HIGH (ref 15–41)
Albumin: 3.7 g/dL (ref 3.5–5.0)
Anion gap: 11 (ref 5–15)
BUN: 19 mg/dL (ref 6–20)
CALCIUM: 8.4 mg/dL — AB (ref 8.9–10.3)
CO2: 23 mmol/L (ref 22–32)
CREATININE: 0.77 mg/dL (ref 0.44–1.00)
Chloride: 103 mmol/L (ref 101–111)
Glucose, Bld: 107 mg/dL — ABNORMAL HIGH (ref 65–99)
Potassium: 4 mmol/L (ref 3.5–5.1)
Sodium: 137 mmol/L (ref 135–145)
Total Bilirubin: 0.9 mg/dL (ref 0.3–1.2)
Total Protein: 7.5 g/dL (ref 6.5–8.1)

## 2016-11-11 LAB — RAPID URINE DRUG SCREEN, HOSP PERFORMED
AMPHETAMINES: NOT DETECTED
BENZODIAZEPINES: POSITIVE — AB
Barbiturates: NOT DETECTED
Cocaine: NOT DETECTED
Opiates: NOT DETECTED
TETRAHYDROCANNABINOL: NOT DETECTED

## 2016-11-11 LAB — ETHANOL: ALCOHOL ETHYL (B): 8 mg/dL — AB (ref <5)

## 2016-11-11 LAB — PROTIME-INR
INR: 1.21
PROTHROMBIN TIME: 15.4 s — AB (ref 11.4–15.2)

## 2016-11-11 LAB — I-STAT BETA HCG BLOOD, ED (MC, WL, AP ONLY): I-stat hCG, quantitative: <5 m[IU]/mL (ref <5)

## 2016-11-11 LAB — MAGNESIUM: MAGNESIUM: 2.1 mg/dL (ref 1.7–2.4)

## 2016-11-11 MED ORDER — FOLIC ACID 1 MG PO TABS
1.0000 mg | ORAL_TABLET | Freq: Every day | ORAL | Status: DC
  Administered 2016-11-11 – 2016-11-19 (×9): 1 mg via ORAL
  Filled 2016-11-11 (×9): qty 1

## 2016-11-11 MED ORDER — FAMOTIDINE 20 MG PO TABS
20.0000 mg | ORAL_TABLET | Freq: Two times a day (BID) | ORAL | Status: DC
  Administered 2016-11-11 – 2016-11-19 (×12): 20 mg via ORAL
  Filled 2016-11-11 (×14): qty 1

## 2016-11-11 MED ORDER — ONDANSETRON HCL 4 MG/2ML IJ SOLN
4.0000 mg | Freq: Four times a day (QID) | INTRAMUSCULAR | Status: AC | PRN
  Administered 2016-11-11: 4 mg via INTRAVENOUS
  Filled 2016-11-11: qty 2

## 2016-11-11 MED ORDER — ACETAMINOPHEN 650 MG RE SUPP: 650.0000 mg | Freq: Four times a day (QID) | RECTAL | Status: DC | PRN

## 2016-11-11 MED ORDER — LORAZEPAM 2 MG/ML IJ SOLN: 0.0000 mg | Freq: Two times a day (BID) | INTRAMUSCULAR | Status: DC

## 2016-11-11 MED ORDER — LORAZEPAM 2 MG/ML IJ SOLN
1.0000 mg | Freq: Once | INTRAMUSCULAR | Status: AC
  Administered 2016-11-11: 1 mg via INTRAVENOUS
  Filled 2016-11-11: qty 1

## 2016-11-11 MED ORDER — LORAZEPAM 2 MG/ML IJ SOLN
2.0000 mg | INTRAMUSCULAR | Status: DC | PRN
  Administered 2016-11-11: 3 mg via INTRAVENOUS
  Administered 2016-11-12: 2 mg via INTRAVENOUS
  Administered 2016-11-12: 3 mg via INTRAVENOUS
  Administered 2016-11-12 (×2): 2 mg via INTRAVENOUS
  Administered 2016-11-12: 3 mg via INTRAVENOUS
  Administered 2016-11-12: 2 mg via INTRAVENOUS
  Administered 2016-11-12: 3 mg via INTRAVENOUS
  Administered 2016-11-12: 2 mg via INTRAVENOUS
  Administered 2016-11-13: 3 mg via INTRAVENOUS
  Administered 2016-11-13: 2 mg via INTRAVENOUS
  Administered 2016-11-13 (×2): 3 mg via INTRAVENOUS
  Administered 2016-11-14 (×2): 2 mg via INTRAVENOUS
  Administered 2016-11-14: 3 mg via INTRAVENOUS
  Administered 2016-11-14 – 2016-11-16 (×6): 2 mg via INTRAVENOUS
  Administered 2016-11-16: 3 mg via INTRAVENOUS
  Administered 2016-11-16 – 2016-11-17 (×4): 2 mg via INTRAVENOUS
  Filled 2016-11-11 (×2): qty 2
  Filled 2016-11-11 (×6): qty 1
  Filled 2016-11-11 (×2): qty 2
  Filled 2016-11-11: qty 1
  Filled 2016-11-11: qty 2
  Filled 2016-11-11 (×7): qty 1
  Filled 2016-11-11: qty 2
  Filled 2016-11-11: qty 1
  Filled 2016-11-11: qty 2
  Filled 2016-11-11: qty 1
  Filled 2016-11-11 (×2): qty 2
  Filled 2016-11-11 (×2): qty 1

## 2016-11-11 MED ORDER — SODIUM CHLORIDE 0.9 % IV BOLUS (SEPSIS)
1000.0000 mL | Freq: Once | INTRAVENOUS | Status: AC
Start: 2016-11-11 — End: 2016-11-11
  Administered 2016-11-11: 1000 mL via INTRAVENOUS

## 2016-11-11 MED ORDER — ONDANSETRON HCL 4 MG/2ML IJ SOLN
4.0000 mg | Freq: Once | INTRAMUSCULAR | Status: AC
  Administered 2016-11-11: 4 mg via INTRAVENOUS
  Filled 2016-11-11: qty 2

## 2016-11-11 MED ORDER — SODIUM CHLORIDE 0.9 % IV SOLN
INTRAVENOUS | Status: DC
  Administered 2016-11-11 – 2016-11-12 (×2): via INTRAVENOUS

## 2016-11-11 MED ORDER — ADULT MULTIVITAMIN W/MINERALS CH
1.0000 | ORAL_TABLET | Freq: Every day | ORAL | Status: DC
  Administered 2016-11-11 – 2016-11-19 (×9): 1 via ORAL
  Filled 2016-11-11 (×9): qty 1

## 2016-11-11 MED ORDER — ENOXAPARIN SODIUM 40 MG/0.4ML ~~LOC~~ SOLN
40.0000 mg | SUBCUTANEOUS | Status: DC
  Administered 2016-11-11 – 2016-11-18 (×8): 40 mg via SUBCUTANEOUS
  Filled 2016-11-11 (×8): qty 0.4

## 2016-11-11 MED ORDER — SODIUM CHLORIDE 0.9% FLUSH
3.0000 mL | Freq: Two times a day (BID) | INTRAVENOUS | Status: DC
  Administered 2016-11-11 – 2016-11-19 (×12): 3 mL via INTRAVENOUS

## 2016-11-11 MED ORDER — LORAZEPAM 2 MG/ML IJ SOLN
0.0000 mg | Freq: Four times a day (QID) | INTRAMUSCULAR | Status: DC
  Administered 2016-11-11: 4 mg via INTRAVENOUS
  Filled 2016-11-11: qty 2

## 2016-11-11 MED ORDER — PROMETHAZINE HCL 25 MG PO TABS
12.5000 mg | ORAL_TABLET | Freq: Four times a day (QID) | ORAL | Status: DC | PRN
  Administered 2016-11-12 – 2016-11-19 (×3): 12.5 mg via ORAL
  Filled 2016-11-11 (×3): qty 1

## 2016-11-11 MED ORDER — VITAMIN B-1 100 MG PO TABS
100.0000 mg | ORAL_TABLET | Freq: Every day | ORAL | Status: DC
Start: 2016-11-11 — End: 2016-11-19
  Administered 2016-11-11 – 2016-11-19 (×9): 100 mg via ORAL
  Filled 2016-11-11 (×9): qty 1

## 2016-11-11 MED ORDER — POLYETHYLENE GLYCOL 3350 17 G PO PACK: 17.0000 g | PACK | Freq: Every day | ORAL | Status: DC | PRN

## 2016-11-11 MED ORDER — ACETAMINOPHEN 325 MG PO TABS: 650.0000 mg | ORAL_TABLET | Freq: Four times a day (QID) | ORAL | Status: DC | PRN
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
## 2016-11-11 NOTE — ED Notes (Signed)
Pt ambulatory to the restroom.  

## 2016-11-11 NOTE — H&P (Signed)
History and Physical    Ezma Rehm BJY:[id number] DOB: [birth year] DOA: 11/11/2016  PCP: No PCP Per Patient   Patient coming from: Home  Chief Complaint: Alcohol withdrawal, seizure  HPI: Kristine Macias is a 31 y.o. female with medical history significant for generalized anxiety disorder and severe alcohol dependence with history of withdrawal seizures who presents the emergency department complaints of severe anxiety, tremors, and a seizure in the setting of alcohol withdrawal. Patient reports that she has been attempting to achieve abstinence from alcohol on her own, but has been experiencing severe withdrawal symptoms when she tries to cut back. She reports drinking yesterday, but not today and typically consumes the equivalent of ~8 standardized drinks daily. She reports experiencing increasing tremors and anxiety throughout the day, then felt as though she would have a seizure and fell to the ground, reportedly landing on her dog and not suffering any significant injury. She believes she had a seizure at that time and EMS was called for transport to the hospital. She denies any recent fevers or chills, denies chest pain or palpitations, and denies any significant dyspnea or cough. She has not been vomiting. She denies headache, change in vision or hearing, or focal numbness or weakness. She denies hallucinations.  ED Course: Upon arrival to the ED, patient is found to be afebrile, saturating adequately on room air, tachycardic the 110s, and hypertensive to 140/110. Chemistry panel is notable for mild elevation in transaminases with normal bilirubin. Ethanol level is 8 and CBC features a mild leukocytosis to 10,700 and a mild polycythemia. Pregnancy test is negative. Patient was given 2 L normal saline in the emergency department and 1 mg of IV Ativan. She continued to demonstrate increasing signs of alcohol withdrawal with increasing CIWA score. She will be given 4 mg IV Ativan now and  admitted to the stepdown unit for ongoing evaluation and management of possible seizure in the setting of complicated alcohol withdrawal.  Review of Systems:  All other systems reviewed and apart from HPI, are negative.  Past Medical History:  Diagnosis Date  . Alcohol abuse 11/2014   drinking since age 64. chronic, recurrent. at least 2 detox admits before 2015.   Marland Kitchen Alcoholic hepatitis 0/8657   hepatic steatosis on 11/2014 ultrasound.   . Breast cancer [facility])    age 55, lump removed from left breast  . Chlamydia 2007   bacterial vaginosis 09/2011  . Colitis 12/2014   colonoscopy for diarrhea and abnml CT 01/06/15: erythmatous TI (path: active ileitis, ? emerging IBD), rectal erythema (path: mucosal prolapse). Random bx of normal colon (path unremarkable)   . Congenital deafness    left ear only.  Also noted in mother and sibling (unilateral).   . Depression with anxiety initally at age 84  . Failure to thrive in adult 12/2014.    Malnutrition: n/v, not eating, weight loss, BMI 14.  s/p 01/11/2015 PEG (Dr Dorna Leitz).   . Irregular heart beat 2010   wt/diet related after evaluation  . Pancreas divisum 02/2015   Type 1 seen on CT.   Marland Kitchen Pneumothorax, spontaneous, tension   . Renal disorder   . Seizure ([place]) 06/2015   due to ETOH/benzo withdrawal.   . Spontaneous pneumothorax 08/2012   right.  chest tube placed.   . Thrombocytopenia ([place]) 04/2007    Past Surgical History:  Procedure Laterality Date  . CESAREAN SECTION  07/2009    x 1.  G3, Para 2012.   . CHEST TUBE INSERTION    .  COLONOSCOPY WITH PROPOFOL N/A 01/06/2015   [facility], Dr Rayann Heman. colonoscopy for diarrhea and abnml CT 01/06/15: erythmatous TI (path: active ileitis, ? emerging IBD), rectal erythema (path: mucosal prolapse). Random bx of normal colon (path unremarkable)   . ESOPHAGOGASTRODUODENOSCOPY N/A 01/06/2015   ;[facility], Dr Rayann Heman. For wt loss, N/V: Normal study, duodenal biopsy/pathology: chronic active duodenitis.   Marland Kitchen  ESOPHAGOGASTRODUODENOSCOPY N/A 01/11/2015   Rein-normal with PEG placement  . PEG PLACEMENT N/A 01/11/2015   [facility], Dr Rayann Heman.  for N/V/wt loss/severe malnutrition.      reports that she has been smoking Cigarettes.  She has a 13.00 pack-year smoking history. She has never used smokeless tobacco. She reports that she drinks about 21.6 oz of alcohol per week . She reports that she does not use drugs.  Allergies  Allergen Reactions  . Aspirin Shortness Of Breath  . Effexor [Venlafaxine] Anaphylaxis  . Gabapentin Anaphylaxis  . Sulfa Antibiotics Anaphylaxis  . Tetracyclines & Related Anaphylaxis  . Trazodone And Nefazodone Anaphylaxis  . Latex Hives and Itching  . Paxil [Paroxetine] Hives and Itching  . Seroquel [Quetiapine Fumarate] Hives and Itching  . Zoloft [Sertraline Hcl] Hives and Itching    Family History  Problem Relation Age of Onset  . Thyroid disease Mother   . Cancer Mother     breast cancer  . Cancer Father     lung cancer  . Stroke Other   . Crohn's disease Brother   . Cancer Maternal Grandmother     breast cancer  . Anesthesia problems Neg Hx      Prior to Admission medications   Medication Sig Start Date End Date Taking? Authorizing Provider  clonazePAM (KLONOPIN) 0.5 MG tablet Take 0.5 mg by mouth 2 (two) times daily. Pt does not take when she has been drinking   Yes Historical Provider, MD  folic acid (FOLVITE) 1 MG tablet Take 1 tablet (1 mg total) by mouth daily. 08/06/16  Yes Clanford Marisa Hua, MD  Multiple Vitamin (MULTIVITAMIN WITH MINERALS) TABS tablet Take 1 tablet by mouth daily. 08/07/16  Yes Clanford Marisa Hua, MD  ondansetron (ZOFRAN) 4 MG tablet Take 1 tablet by mouth every 6 (six) hours as needed for nausea/vomiting. 10/25/16  Yes Historical Provider, MD  thiamine 100 MG tablet Take 1 tablet (100 mg total) by mouth daily. 08/06/16  Yes Clanford Marisa Hua, MD  nicotine (NICODERM CQ - DOSED IN MG/24 HOURS) 21 mg/24hr patch Place 1 patch (21 mg total)  onto the skin daily. Patient not taking: Reported on 11/11/2016 09/26/16   Eugenie Filler, MD    Physical Exam: Vitals:   11/11/16 1530 11/11/16 1545 11/11/16 1600 11/11/16 1821  BP: (!) 131/103 (!) 139/113 (!) 134/101 (!) 138/97  Pulse: (!) 109 (!) 111 (!) 116 (!) 110  Resp: 16  17   Temp:      TempSrc:      SpO2: 93%  96%   Weight:      Height:          Constitutional: No acute distress, anxious, tremulous. No pallor. No diaphoresis. Eyes: PERTLA, lids and conjunctivae normal ENMT: Mucous membranes are moist. Posterior pharynx clear of any exudate or lesions.   Neck: normal, supple, no masses, no thyromegaly Respiratory: clear to auscultation bilaterally, no wheezing, no crackles. Normal respiratory effort.   Cardiovascular: Rate ~110 and regular. No extremity edema. No significant JVD. Abdomen: No distension, no tenderness, no masses palpated. Bowel sounds normal.  Musculoskeletal: no clubbing / cyanosis. No  joint deformity upper and lower extremities. Normal muscle tone.  Skin: no significant rashes, lesions, ulcers. Warm, dry, well-perfused. Neurologic: CN 2-12 grossly intact. Sensation intact, DTR normal. Strength 5/5 in all 4 limbs. Tremulous. Psychiatric:  Alert and oriented x 3. Anxious. Cooperative.     Labs on Admission: I have personally reviewed following labs and imaging studies  CBC:  Recent Labs Lab 11/11/16 1604  WBC 10.7*  NEUTROABS 7.8*  HGB 15.6*  HCT 46.4*  MCV 86.9  PLT 012   Basic Metabolic Panel:  Recent Labs Lab 11/11/16 1604  NA 137  K 4.0  CL 103  CO2 23  GLUCOSE 107*  BUN 19  CREATININE 0.77  CALCIUM 8.4*   GFR: Estimated Creatinine Clearance: 95.4 mL/min (by C-G formula based on SCr of 0.77 mg/dL). Liver Function Tests:  Recent Labs Lab 11/11/16 1604  AST 89*  ALT 93*  ALKPHOS 102  BILITOT 0.9  PROT 7.5  ALBUMIN 3.7   No results for input(s): LIPASE, AMYLASE in the last 168 hours. No results for input(s):  AMMONIA in the last 168 hours. Coagulation Profile: No results for input(s): INR, PROTIME in the last 168 hours. Cardiac Enzymes: No results for input(s): CKTOTAL, CKMB, CKMBINDEX, TROPONINI in the last 168 hours. BNP (last 3 results) No results for input(s): PROBNP in the last 8760 hours. HbA1C: No results for input(s): HGBA1C in the last 72 hours. CBG: No results for input(s): GLUCAP in the last 168 hours. Lipid Profile: No results for input(s): CHOL, HDL, LDLCALC, TRIG, CHOLHDL, LDLDIRECT in the last 72 hours. Thyroid Function Tests: No results for input(s): TSH, T4TOTAL, FREET4, T3FREE, THYROIDAB in the last 72 hours. Anemia Panel: No results for input(s): VITAMINB12, FOLATE, FERRITIN, TIBC, IRON, RETICCTPCT in the last 72 hours. Urine analysis:    Component Value Date/Time   COLORURINE YELLOW 09/20/2016 2152   APPEARANCEUR HAZY (A) 09/20/2016 2152   APPEARANCEUR Clear 02/15/2016 1456   LABSPEC 1.025 09/20/2016 2152   LABSPEC 1.006 12/20/2014 1018   PHURINE 7.0 09/20/2016 2152   GLUCOSEU NEGATIVE 09/20/2016 2152   GLUCOSEU Negative 12/20/2014 1018   HGBUR NEGATIVE 09/20/2016 2152   BILIRUBINUR NEGATIVE 09/20/2016 2152   BILIRUBINUR neg 03/13/2016 1700   BILIRUBINUR Negative 02/15/2016 1456   BILIRUBINUR Negative 12/20/2014 1018   KETONESUR 5 (A) 09/20/2016 2152   PROTEINUR 30 (A) 09/20/2016 2152   UROBILINOGEN negative 03/13/2016 1700   UROBILINOGEN 0.2 06/13/2012 2306   NITRITE NEGATIVE 09/20/2016 2152   LEUKOCYTESUR NEGATIVE 09/20/2016 2152   LEUKOCYTESUR Negative 02/15/2016 1456   LEUKOCYTESUR Negative 12/20/2014 1018   Sepsis Labs: @LABRCNTIP (procalcitonin:4,lacticidven:4) )No results found for this or any previous visit (from the past 240 hour(s)).   Radiological Exams on Admission: No results found.  EKG: Ordered and pending.   Assessment/Plan  1. Seizure  - Pt presents with reported seizure at home just PTA - Event was unwitnessed; pt describes  increasing anxiety and tremors, sensation she "was about to have a seizure" before briefly losing consciousness  - She reports a hx of similar events previously in setting of alcohol withdrawal  - She has signs of active withdrawal on arrival and that is the suspected etiology; will address as below  - Check MRI and EEG, mag level pending    2. Alcohol dependence with complicated withdrawal  - Pt reports daily consumption of ~8 drinks, last drink 11/10/16, EtOH level 8 on arrival  - Concern for severe withdrawal and likely seizure just PTA  - She was given  1 mg Ativan initially in ED, continued to worsen and was given 4 mg IV Ativan per protocol with some improvement  - Admit to SDU, continue to monitor with CIWA and treat with Ativan  - Supplement vitamins and minerals, provide IVF hydration, acid-reducers, social work consultation requested   3. Generalized anxiety disorder  - Pt is quite anxious on admission and is being treated with Ativan in setting of EtOH withdrawal  - Home Klonopin is held   4. Hypertensive urgency  - DBP in 110 range in ED in setting of alcohol withdrawal  - Treat withdrawal sxs per protocol  - Hydralazine IVP's prn    DVT prophylaxis: sq Lovenox  Code Status: Full  Family Communication: Discussed with patient Disposition Plan: Admit to SDU Consults called: None Admission status: Inpatient    Vianne Bulls, MD [facility] Pager [phone]  If 7PM-7AM, please contact night-coverage [email or web address] Password [facility]  11/11/2016, 7:03 PM
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
## 2016-11-11 NOTE — ED Notes (Signed)
Patient ambulated to the restroom independently.  Patient is more calm now.  See new CIWA

## 2016-11-11 NOTE — ED Notes (Signed)
Patient ambulated to the restroom independently

## 2016-11-11 NOTE — ED Notes (Signed)
Patient is stable and ready to be transport to the floor at this time.  Report was called to 4E RN.  Belongings taken with the patient to the floor.   

## 2016-11-11 NOTE — ED Triage Notes (Signed)
Last drink was yesterday pt normally consumes 2-3 "tall boys" that are 8% alcohol

## 2016-11-12 ENCOUNTER — Inpatient Hospital Stay (HOSPITAL_COMMUNITY): Payer: Medicaid Other

## 2016-11-12 ENCOUNTER — Ambulatory Visit (HOSPITAL_COMMUNITY): Payer: Self-pay

## 2016-11-12 ENCOUNTER — Encounter (HOSPITAL_COMMUNITY): Payer: Self-pay | Admitting: Family Medicine

## 2016-11-12 DIAGNOSIS — F1010 Alcohol abuse, uncomplicated: Secondary | ICD-10-CM

## 2016-11-12 DIAGNOSIS — F10232 Alcohol dependence with withdrawal with perceptual disturbance: Principal | ICD-10-CM

## 2016-11-12 DIAGNOSIS — F10230 Alcohol dependence with withdrawal, uncomplicated: Secondary | ICD-10-CM

## 2016-11-12 DIAGNOSIS — I160 Hypertensive urgency: Secondary | ICD-10-CM

## 2016-11-12 DIAGNOSIS — F10239 Alcohol dependence with withdrawal, unspecified: Secondary | ICD-10-CM

## 2016-11-12 DIAGNOSIS — F411 Generalized anxiety disorder: Secondary | ICD-10-CM

## 2016-11-12 LAB — URINALYSIS, ROUTINE W REFLEX MICROSCOPIC
Bilirubin Urine: NEGATIVE
Bilirubin Urine: NEGATIVE
GLUCOSE, UA: NEGATIVE mg/dL
Glucose, UA: NEGATIVE mg/dL
HGB URINE DIPSTICK: NEGATIVE
Ketones, ur: NEGATIVE mg/dL
Ketones, ur: NEGATIVE mg/dL
LEUKOCYTES UA: NEGATIVE
Leukocytes, UA: NEGATIVE
NITRITE: NEGATIVE
Nitrite: NEGATIVE
PH: 7 (ref 5.0–8.0)
PROTEIN: NEGATIVE mg/dL
Protein, ur: 100 mg/dL — AB
SPECIFIC GRAVITY, URINE: 1.029 (ref 1.005–1.030)
Specific Gravity, Urine: 1.035 — ABNORMAL HIGH (ref 1.005–1.030)
pH: 5 (ref 5.0–8.0)

## 2016-11-12 LAB — COMPREHENSIVE METABOLIC PANEL
ALBUMIN: 3 g/dL — AB (ref 3.5–5.0)
ALT: 59 U/L — ABNORMAL HIGH (ref 14–54)
ANION GAP: 9 (ref 5–15)
AST: 57 U/L — AB (ref 15–41)
Alkaline Phosphatase: 76 U/L (ref 38–126)
BILIRUBIN TOTAL: 0.8 mg/dL (ref 0.3–1.2)
BUN: 17 mg/dL (ref 6–20)
CHLORIDE: 107 mmol/L (ref 101–111)
CO2: 20 mmol/L — AB (ref 22–32)
Calcium: 8.2 mg/dL — ABNORMAL LOW (ref 8.9–10.3)
Creatinine, Ser: 0.81 mg/dL (ref 0.44–1.00)
GFR calc Af Amer: >60 mL/min (ref >60)
GFR calc non Af Amer: >60 mL/min (ref >60)
GLUCOSE: 87 mg/dL (ref 65–99)
POTASSIUM: 3.7 mmol/L (ref 3.5–5.1)
SODIUM: 136 mmol/L (ref 135–145)
Total Protein: 5.8 g/dL — ABNORMAL LOW (ref 6.5–8.1)

## 2016-11-12 LAB — MAGNESIUM: Magnesium: 1.7 mg/dL (ref 1.7–2.4)

## 2016-11-12 LAB — CBC WITH DIFFERENTIAL/PLATELET
BASOS ABS: 0 10*3/uL (ref 0.0–0.1)
BASOS PCT: 1 %
EOS ABS: 0.2 10*3/uL (ref 0.0–0.7)
Eosinophils Relative: 3 %
HEMATOCRIT: 35.8 % — AB (ref 36.0–46.0)
Hemoglobin: 11.5 g/dL — ABNORMAL LOW (ref 12.0–15.0)
Lymphocytes Relative: 39 %
Lymphs Abs: 2.8 10*3/uL (ref 0.7–4.0)
MCH: 28.5 pg (ref 26.0–34.0)
MCHC: 32.1 g/dL (ref 30.0–36.0)
MCV: 88.8 fL (ref 78.0–100.0)
MONO ABS: 0.4 10*3/uL (ref 0.1–1.0)
MONOS PCT: 6 %
NEUTROS ABS: 3.8 10*3/uL (ref 1.7–7.7)
Neutrophils Relative %: 52 %
PLATELETS: 203 10*3/uL (ref 150–400)
RBC: 4.03 MIL/uL (ref 3.87–5.11)
RDW: 16.6 % — AB (ref 11.5–15.5)
WBC: 7.3 10*3/uL (ref 4.0–10.5)

## 2016-11-12 MED ORDER — HYDRALAZINE HCL 20 MG/ML IJ SOLN: 5.0000 mg | INTRAMUSCULAR | Status: DC | PRN

## 2016-11-12 MED ORDER — SODIUM CHLORIDE 0.9 % IV SOLN
INTRAVENOUS | Status: DC
  Administered 2016-11-12: 1000 mL via INTRAVENOUS
  Administered 2016-11-13 – 2016-11-19 (×7): via INTRAVENOUS

## 2016-11-12 MED ORDER — GADOBENATE DIMEGLUMINE 529 MG/ML IV SOLN
12.0000 mL | Freq: Once | INTRAVENOUS | Status: AC | PRN
  Administered 2016-11-12: 12 mL via INTRAVENOUS

## 2016-11-12 NOTE — Progress Notes (Signed)
Chaplain was paged to offer support to a depressed Pt. Chaplain visited Pt and tried to get an emotional response from the Pt.  She express sorrow of missing her dad. She said she was there due to alcohol abuse. She said she drake maybe 4 beers a day for 2 years. Chaplain sought for her to be truthful but didn't press. Chaplain asked about her father and told stories about his relationship and how he celebrated his deceased mother. Pt responded to the conversation with a smile. We both celebrated. Her boyfriend cam in and she smiled. Chaplain prayed with Pt and encouraged her.    11/12/16 2100  Clinical Encounter Type  Visited With Patient  Visit Type Spiritual support  Referral From Patient;Nurse  Spiritual Encounters  Spiritual Needs Prayer;Emotional  Stress Factors  Patient Stress Factors Health changes

## 2016-11-12 NOTE — Plan of Care (Addendum)
Problem: Education: Goal: Knowledge of disease or condition will improve Outcome: Progressing Patient attempted to stop drinking at home and had a seizure prior to hospital arrival. Patient educated on dangers of alcoholism. Goal: Understanding of discharge needs will improve Outcome: Progressing Patient with increased anxiety- very withdrawn/ flat. Will assess need for social work consult.   Problem: Physical Regulation: Goal: Complications related to the disease process, condition or treatment will be avoided or minimized Outcome: Progressing Patient receiving Ativan IV 2-3 mg every 2 hours. CIWA scale monitoring scheduled every 4 hours, but needs medication more frequently. Consistently scoring in 20's prior to Ativan.   Problem: Safety: Goal: Ability to remain free from injury will improve Outcome: Progressing Call bell within reach and patient educated on use. Further directed that we will utilize bed alarms as she should not be getting up independently. Seizure precautions initiated and patient has oxygen, suction, and bed cushions for safety. Compliant with this.   Problem: Tissue Perfusion: Goal: Risk factors for ineffective tissue perfusion will decrease Outcome: Progressing Lovenox for VTE prophylaxis.   Problem: Nutrition: Goal: Adequate nutrition will be maintained Outcome: Progressing Patient nauseous from withdrawal but tolerates fluids PO. No desire to eat at this time. Patient has history of malnutrition but states she has not been losing weight recently- although she has a decreased appetite.

## 2016-11-12 NOTE — Progress Notes (Signed)
Pt is getting Ativan every 2 hours and has constant movements per RN. EEG will be attempted on 3/20, RN aware.

## 2016-11-12 NOTE — Progress Notes (Signed)
Patient seen and evaluated earlier this AM by my associate. Will continue CIWA protocol.  Continue plan as outlined in H and P.  Gen: pt in nad, alert and awake CV: no cyanosis Pulm: Equal chest rise, no wheezes  Will reassess next am.  Velvet Bathe

## 2016-11-13 ENCOUNTER — Inpatient Hospital Stay (HOSPITAL_COMMUNITY): Payer: Medicaid Other

## 2016-11-13 DIAGNOSIS — R569 Unspecified convulsions: Secondary | ICD-10-CM

## 2016-11-13 NOTE — Care Management Note (Signed)
Case Management Note  Patient Details  Name: Kristine Macias MRN: [id number] Date of Birth: 1986/05/11  Subjective/Objective:                 Patient from home, ETOH withdraw. CSW following for resources.  PCP   Primary Care Provider: [place]     Primary Care Provider: [place]  Address: [place], [place] [address]  Contact: [place]  Telephone: [phone]  Telephone: [phone]  Contact: [place]  Work Phone: [phone]        Action/Plan:   Expected Discharge Date:                  Expected Discharge Plan:  Home/Self Care  In-House Referral:  Clinical Social Work  Discharge planning Services  CM Consult  Post Acute Care Choice:    Choice offered to:     DME Arranged:    DME Agency:     HH Arranged:    [place] Agency:     Status of Service:  In process, will continue to follow  If discussed at Long Length of Stay Meetings, dates discussed:    Additional Comments:  Carles Collet, RN 11/13/2016, 4:21 PM

## 2016-11-13 NOTE — Procedures (Signed)
ELECTROENCEPHALOGRAM REPORT  Date of Study: 11/13/2016  Patient's Name: Kristine Macias MRN: [id number] Date of Birth: 1985/10/28  Referring Provider: Dr. Christia Reading Opyd  Clinical History: This is a 31 year old woman with complaint of seizures.   Medications: acetaminophen (TYLENOL) tablet 650 mg  enoxaparin (LOVENOX) injection 40 mg  famotidine (PEPCID) tablet 20 mg  folic acid (FOLVITE) tablet 1 mg  hydrALAZINE (APRESOLINE) injection 5 mg  LORazepam (ATIVAN) injection 2-3 mg  multivitamin with minerals tablet 1 tablet  polyethylene glycol (MIRALAX / GLYCOLAX) packet 17 g  promethazine (PHENERGAN) tablet 12.5 mg  thiamine (VITAMIN B-1) tablet 100 mg   Technical Summary: A multichannel digital EEG recording measured by the international 10-20 system with electrodes applied with paste and impedances below 5000 ohms performed in our laboratory with EKG monitoring in an awake and drowsy patient.  Hyperventilation was not performed. Photic stimulation was performed.  The digital EEG was referentially recorded, reformatted, and digitally filtered in a variety of bipolar and referential montages for optimal display.    Description: The patient is awake and drowsy during the recording.  There is no clear posterior dominant rhythm. The record is symmetric.  There is an excess amount of diffuse low voltage beta activity seen throughout the recording. During drowsiness, there is an increase in theta slowing of the background. Deeper stages of sleep were not seen.  Photic stimulation did not elicit any abnormalities.  There were no epileptiform discharges or electrographic seizures seen.    EKG lead was unremarkable.  Impression: This awake and drowsy EEG is normal except for excess amount of diffuse low voltage beta activity.  Clinical Correlation: Diffuse low voltage beta activity is commonly seen with sedating medications such as benzodiazepines.  In the absence of sedating medications,  anxiety and hyperthyroidism may produce generalized beta activity.  The absence of epileptiform discharges does not exclude a clinical diagnosis of epilepsy. Clinical correlation is advised.   Ellouise Newer, M.D.

## 2016-11-13 NOTE — Progress Notes (Signed)
PROGRESS NOTE    Kristine Macias  ITG:[id number] DOB: 02/24/1986 DOA: 11/11/2016 PCP: No PCP Per Patient    Brief Narrative:  Patient is a 31 year old female with history of generalized anxiety disorder and severe alcohol dependence. Presented to the hospital after having withdrawal seizures. Is currently on CIWA protocol.   Assessment & Plan:   Principal Problem:   Seizure due to alcohol withdrawal ([place]) - We'll continue current medication regimen. Patient still with significant symptoms however current regimen helping ameliorate symptoms. Also no breakthrough seizure reported.  Active Problems:   Generalized anxiety disorder - on benzodiazepines for withdrawal symptoms    Alcoholic hepatitis without ascites   Alcohol abuse - Will need continued reenforcement to discontinue alcohol    Leukocytosis   Hypertensive urgency - Blood pressures have come down. May be elevated secondary to withdrawal. Should blood pressures remain consistently elevated would consider starting antihypertensive medication. Of note patient's blood pressure has gone down as low as 110/87 off antihypertensives   DVT prophylaxis: lovenox Code Status: Full Family Communication: Discussed directly with patient Disposition Plan: Continue close monitoring in stepdown unit   Consultants:   None   Procedures: None   Antimicrobials: None   Subjective: The patient has no new complaints still feeling withdrawal symptoms   Objective: Vitals:   11/13/16 1202 11/13/16 1300 11/13/16 1400 11/13/16 1500  BP: (!) 131/98 (!) 132/93 (!) 135/96 (!) 122/108  Pulse: 84 64 77 86  Resp: 16 16 18 20   Temp: 98.6 F (37 C)   99 F (37.2 C)  TempSrc: Oral   Oral  SpO2: 96% 95% 96% 95%  Weight:      Height:        Intake/Output Summary (Last 24 hours) at 11/13/16 1741 Last data filed at 11/13/16 1200  Gross per 24 hour  Intake           2197.5 ml  Output             1801 ml  Net            396.5 ml     Filed Weights   11/11/16 1456 11/11/16 2020  Weight: 64.4 kg (142 lb) 62.3 kg (137 lb 5.6 oz)    Examination:  General exam: Appears calm and comfortable , In no acute distress Respiratory system: Clear to auscultation. Respiratory effort normal. Cardiovascular system: S1 & S2 heard, RRR.  Gastrointestinal system: Abdomen is nondistended, soft and nontender. No organomegaly or masses felt. Normal bowel sounds heard. Central nervous system: Alert and oriented. No focal neurological deficits. Extremities: Symmetric 5 x 5 power. Skin: No rashes, lesions or ulcers, limited exam Psychiatry:Mood & affect appropriate.   Data Reviewed: I have personally reviewed following labs and imaging studies  CBC:  Recent Labs Lab 11/11/16 1604 11/12/16 0903  WBC 10.7* 7.3  NEUTROABS 7.8* 3.8  HGB 15.6* 11.5*  HCT 46.4* 35.8*  MCV 86.9 88.8  PLT 299 830   Basic Metabolic Panel:  Recent Labs Lab 11/11/16 1604 11/11/16 1856 11/12/16 0903  NA 137  --  136  K 4.0  --  3.7  CL 103  --  107  CO2 23  --  20*  GLUCOSE 107*  --  87  BUN 19  --  17  CREATININE 0.77  --  0.81  CALCIUM 8.4*  --  8.2*  MG  --  2.1 1.7   GFR: Estimated Creatinine Clearance: 94.2 mL/min (by C-G formula based on SCr of 0.81  mg/dL). Liver Function Tests:  Recent Labs Lab 11/11/16 1604 11/12/16 0903  AST 89* 57*  ALT 93* 59*  ALKPHOS 102 76  BILITOT 0.9 0.8  PROT 7.5 5.8*  ALBUMIN 3.7 3.0*   No results for input(s): LIPASE, AMYLASE in the last 168 hours. No results for input(s): AMMONIA in the last 168 hours. Coagulation Profile:  Recent Labs Lab 11/11/16 2029  INR 1.21   Cardiac Enzymes: No results for input(s): CKTOTAL, CKMB, CKMBINDEX, TROPONINI in the last 168 hours. BNP (last 3 results) No results for input(s): PROBNP in the last 8760 hours. HbA1C: No results for input(s): HGBA1C in the last 72 hours. CBG: No results for input(s): GLUCAP in the last 168 hours. Lipid Profile: No  results for input(s): CHOL, HDL, LDLCALC, TRIG, CHOLHDL, LDLDIRECT in the last 72 hours. Thyroid Function Tests: No results for input(s): TSH, T4TOTAL, FREET4, T3FREE, THYROIDAB in the last 72 hours. Anemia Panel: No results for input(s): VITAMINB12, FOLATE, FERRITIN, TIBC, IRON, RETICCTPCT in the last 72 hours. Sepsis Labs: No results for input(s): PROCALCITON, LATICACIDVEN in the last 168 hours.  No results found for this or any previous visit (from the past 240 hour(s)).   Radiology Studies: Mr Jeri Cos Wo Contrast  Result Date: 11/12/2016 CLINICAL DATA:  Seizure, alcohol withdrawal. EXAM: MRI HEAD WITHOUT AND WITH CONTRAST TECHNIQUE: Multiplanar, multiecho pulse sequences of the brain and surrounding structures were obtained without and with intravenous contrast. CONTRAST:  84m MULTIHANCE GADOBENATE DIMEGLUMINE 529 MG/ML IV SOLN COMPARISON:  Head CT 09/21/2016 FINDINGS: Brain: No focal diffusion restriction to indicate acute infarct. No intraparenchymal hemorrhage. The brain parenchymal signal is normal. No mass lesion or midline shift. No hydrocephalus or extra-axial fluid collection. The midline structures are normal. No age advanced or lobar predominant atrophy. Vascular: Major intracranial arterial and venous sinus flow voids are preserved. No evidence of chronic microhemorrhage or amyloid angiopathy. Skull and upper cervical spine: The visualized skull base, calvarium, upper cervical spine and extracranial soft tissues are normal. Sinuses/Orbits: No fluid levels or advanced mucosal thickening. No mastoid effusion. Normal orbits. IMPRESSION: Normal MRI of the brain. Electronically Signed   By: KUlyses JarredM.D.   On: 11/12/2016 04:57    Scheduled Meds: . enoxaparin (LOVENOX) injection  40 mg Subcutaneous Q24H  . famotidine  20 mg Oral BID  . folic acid  1 mg Oral Daily  . multivitamin with minerals  1 tablet Oral Daily  . sodium chloride flush  3 mL Intravenous Q12H  . thiamine  100 mg  Oral Daily   Continuous Infusions: . sodium chloride 75 mL/hr at 11/13/16 0438     LOS: 2 days    Time spent: > 35 minutes  VVelvet Bathe MD [facility] Pager [phone] If 7PM-7AM, please contact night-coverage [email or web address] Password [facility]3/[facility]2018, 5:41 PM

## 2016-11-13 NOTE — Progress Notes (Signed)
EEG Completed; Results Pending  

## 2016-11-14 DIAGNOSIS — K7010 Alcoholic hepatitis without ascites: Secondary | ICD-10-CM

## 2016-11-14 MED ORDER — CLONAZEPAM 0.5 MG PO TABS
0.5000 mg | ORAL_TABLET | Freq: Two times a day (BID) | ORAL | Status: DC
  Administered 2016-11-14 – 2016-11-16 (×6): 0.5 mg via ORAL
  Filled 2016-11-14 (×6): qty 1

## 2016-11-14 NOTE — Progress Notes (Signed)
PROGRESS NOTE   Kristine Macias  DXA:[id number]    DOB: [birth year]    DOA: 11/11/2016  PCP: No PCP Per Patient   I have briefly reviewed patients previous medical records in [facility].  Brief Narrative:  31 year old female with PMH of alcohol dependence, prior alcoholic hepatitis, depression, anxiety, seizures (alcohol withdrawal and may be unrelated also-follows at [facility]), presented to ED with complaints of severe anxiety, tremors and unwitnessed seizure in the setting of alcohol withdrawal. She states that she first had seizures approximately 2 years ago which was unrelated to alcohol abuse and apparently has been on Klonopin and followed at [facility]. She had cut down from her usual 24 ounce beers 5 per day to 2 per day when above symptoms occurred. In ED, not hypoxic, was mildly tachycardic, hypertensive, pregnancy test negative. She was admitted to stepdown for management of alcohol withdrawal complicated by seizures. Improving slowly.   Assessment & Plan:   Principal Problem:   Seizure due to alcohol withdrawal ([place]) Active Problems:   Generalized anxiety disorder   Alcoholic hepatitis without ascites   Alcohol abuse   Alcohol withdrawal syndrome with complication (HCC)   Leukocytosis   Hypertensive urgency   Alcohol withdrawal seizures - As stated above, patient gives a prior history of seizures on related to alcohol dependence. However current episode seems to be related to alcohol withdrawal. - Treating per CIWA protocol. No further seizures since admission. - Resume home dose of clonazepam 0.5 MG twice a day - She has been counseled that she should not drive until cleared by physician during outpatient follow-up. -MRI brain: Normal. EEG: Normal except for excess amount of diffuse low-voltage beta activity area   Alcohol dependence with withdrawal - Management as above. Continue multivitamins. - Had CIWA of 14 this morning  Anxiety and depression - Stable without  suicidal or homicidal ideations or audiovisual hallucinations.  Anemia -May be dilutional. Follow CBC in a.m.  Alcoholic hepatitis - Minimally elevated transaminases without GI symptoms except for decreased appetite.   Elevated blood pressures - Likely secondary to alcohol withdrawal. Improved.   DVT prophylaxis: Lovenox  Code Status: Full  Family Communication: None at bedside  Disposition: Admitted to stepdown. DC home when medically stable, possibly in 48 hours.  Consultants:  None   Procedures:  None   Antimicrobials:  None    Subjective: States that overall she feels better. No further seizure like activity since admission. Denies headache or tremulousness.   ROS: Decreased appetite without nausea, vomiting or abdominal pain. No suicidal or homicidal ideations or auditory or visual hallucinations.   Objective:  Vitals:   11/14/16 0500 11/14/16 0722 11/14/16 0800 11/14/16 1151  BP: 132/87  127/77 (!) 134/96  Pulse:      Resp: (!) 22 18 17 17   Temp: 98.4 F (36.9 C) 98.6 F (37 C)  98.8 F (37.1 C)  TempSrc: Oral Oral  Oral  SpO2:      Weight:      Height:        Examination:  General exam: Pleasant young female lying comfortably supine in bed.  Respiratory system: Clear to auscultation. Respiratory effort normal. Cardiovascular system: S1 & S2 heard, RRR. No JVD, murmurs, rubs, gallops or clicks. No pedal edema.Telemetry: Sinus rhythm.  Gastrointestinal system: Abdomen is nondistended, soft and nontender. No organomegaly or masses felt. Normal bowel sounds heard. Central nervous system: Alert and oriented. No focal neurological deficits. Extremities: Symmetric 5 x 5 power. Skin: No rashes, lesions or  ulcers Psychiatry: Judgement and insight appear normal. Mood & affect appropriate.     Data Reviewed: I have personally reviewed following labs and imaging studies  CBC:  Recent Labs Lab 11/11/16 1604 11/12/16 0903  WBC 10.7* 7.3  NEUTROABS 7.8*  3.8  HGB 15.6* 11.5*  HCT 46.4* 35.8*  MCV 86.9 88.8  PLT 299 546   Basic Metabolic Panel:  Recent Labs Lab 11/11/16 1604 11/11/16 1856 11/12/16 0903  NA 137  --  136  K 4.0  --  3.7  CL 103  --  107  CO2 23  --  20*  GLUCOSE 107*  --  87  BUN 19  --  17  CREATININE 0.77  --  0.81  CALCIUM 8.4*  --  8.2*  MG  --  2.1 1.7   Liver Function Tests:  Recent Labs Lab 11/11/16 1604 11/12/16 0903  AST 89* 57*  ALT 93* 59*  ALKPHOS 102 76  BILITOT 0.9 0.8  PROT 7.5 5.8*  ALBUMIN 3.7 3.0*   Coagulation Profile:  Recent Labs Lab 11/11/16 2029  INR 1.21   Cardiac Enzymes: No results for input(s): CKTOTAL, CKMB, CKMBINDEX, TROPONINI in the last 168 hours. HbA1C: No results for input(s): HGBA1C in the last 72 hours. CBG: No results for input(s): GLUCAP in the last 168 hours.  No results found for this or any previous visit (from the past 240 hour(s)).       Radiology Studies: No results found.      Scheduled Meds: . enoxaparin (LOVENOX) injection  40 mg Subcutaneous Q24H  . famotidine  20 mg Oral BID  . folic acid  1 mg Oral Daily  . multivitamin with minerals  1 tablet Oral Daily  . sodium chloride flush  3 mL Intravenous Q12H  . thiamine  100 mg Oral Daily   Continuous Infusions: . sodium chloride 75 mL/hr at 11/13/16 2042     LOS: 3 days     Masayuki Sakai, MD, FACP, FHM. [facility] Pager [phone]  If 7PM-7AM, please contact night-coverage [email or web address] Password [facility] 11/14/2016, 3:23 PM

## 2016-11-15 LAB — CBC
HCT: 36.7 % (ref 36.0–46.0)
HEMOGLOBIN: 11.6 g/dL — AB (ref 12.0–15.0)
MCH: 28 pg (ref 26.0–34.0)
MCHC: 31.6 g/dL (ref 30.0–36.0)
MCV: 88.6 fL (ref 78.0–100.0)
Platelets: 176 10*3/uL (ref 150–400)
RBC: 4.14 MIL/uL (ref 3.87–5.11)
RDW: 16.5 % — ABNORMAL HIGH (ref 11.5–15.5)
WBC: 6.6 10*3/uL (ref 4.0–10.5)

## 2016-11-15 NOTE — Progress Notes (Signed)
PROGRESS NOTE   Kristine Macias  TDV:[id number]    DOB: 1985-12-01    DOA: 11/11/2016  PCP: No PCP Per Patient   I have briefly reviewed patients previous medical records in [facility].  Brief Narrative:  31 year old female with PMH of alcohol dependence, prior alcoholic hepatitis, depression, anxiety, seizures (alcohol withdrawal and may be unrelated also-follows at [facility]), presented to ED with complaints of severe anxiety, tremors and unwitnessed seizure in the setting of alcohol withdrawal. She states that she first had seizures approximately 2 years ago which was unrelated to alcohol abuse and apparently has been on Klonopin and followed at [facility]. She had cut down from her usual 24 ounce beers 5 per day to 2 per day when above symptoms occurred. In ED, not hypoxic, was mildly tachycardic, hypertensive, pregnancy test negative. She was admitted to stepdown for management of alcohol withdrawal complicated by seizures. Improving slowly.   Assessment & Plan:   Principal Problem:   Seizure due to alcohol withdrawal ([place]) Active Problems:   Generalized anxiety disorder   Alcoholic hepatitis without ascites   Alcohol abuse   Alcohol withdrawal syndrome with complication (HCC)   Leukocytosis   Hypertensive urgency   Alcohol withdrawal seizures - As stated above, patient gives a prior history of seizures unrelated to alcohol dependence. However current episode seems to be related to alcohol withdrawal. - Treating per CIWA protocol. No further seizures since admission. - Resumed home dose of clonazepam 0.5 MG twice a day - She has been counseled that she should not drive until cleared by physician during outpatient follow-up. - MRI brain: Normal. EEG: Normal except for excess amount of diffuse low-voltage beta activity area   Alcohol dependence with withdrawal - Management as above. Continue multivitamins. - Had CIWA of 18-24 on 3/20 1 at night. Since midnight however CIWA scores  ranging between 0-1. Continue management for additional 24 hours. - Requested clinical social worker consultation to assist patient with outpatient alcohol abstinence resources.  Anxiety and depression - Stable without suicidal or homicidal ideations or audiovisual hallucinations.  Anemia -May be dilutional. Stable.  Alcoholic hepatitis - Minimally elevated transaminases without GI symptoms except for decreased appetite.   Elevated blood pressures - Likely secondary to alcohol withdrawal. Improved.  Muscular right upper back pain Supportive treatment with ice pack, acetaminophen/NSAIDs.    DVT prophylaxis: Lovenox  Code Status: Full  Family Communication: None at bedside  Disposition: Admitted to stepdown. DC home when medically stable, possibly in 24 hours.  Consultants:  None   Procedures:  None   Antimicrobials:  None    Subjective: States that overall she feels better. No further seizure like activity since admission. Denies headache or tremulousness. Complained of right upper back pain since last night, worse on chest wall movements or deep inspiration. No dyspnea reported. No cough.   ROS: Appetite improving . No suicidal or homicidal ideations or auditory or visual hallucinations. Overnight events noted   Objective:  Vitals:   11/15/16 0700 11/15/16 0744 11/15/16 0800 11/15/16 1224  BP: 103/72  (!) 123/92 128/87  Pulse: (!) 56  74 70  Resp: 14  17 17   Temp:   98.2 F (36.8 C) 98.6 F (37 C)  TempSrc:  Oral Oral Oral  SpO2: 97%  97% 94%  Weight:      Height:        Examination: Patient was examined with her female RN in the room.  General exam: Pleasant young female lying comfortably supine in  bed.  Respiratory system: Clear to auscultation. Respiratory effort normal. Cardiovascular system: S1 & S2 heard, RRR. No JVD, murmurs, rubs, gallops or clicks. No pedal edema.Telemetry: Sinus rhythm.  Gastrointestinal system: Abdomen is nondistended, soft and  nontender. No organomegaly or masses felt. Normal bowel sounds heard. Central nervous system: Alert and oriented. No focal neurological deficits. Extremities: Symmetric 5 x 5 power. Skin: No rashes, lesions or ulcers Psychiatry: Judgement and insight appear normal. Mood & affect appropriate.     Data Reviewed: I have personally reviewed following labs and imaging studies  CBC:  Recent Labs Lab 11/11/16 1604 11/12/16 0903 11/15/16 0313  WBC 10.7* 7.3 6.6  NEUTROABS 7.8* 3.8  --   HGB 15.6* 11.5* 11.6*  HCT 46.4* 35.8* 36.7  MCV 86.9 88.8 88.6  PLT 299 203 067   Basic Metabolic Panel:  Recent Labs Lab 11/11/16 1604 11/11/16 1856 11/12/16 0903  NA 137  --  136  K 4.0  --  3.7  CL 103  --  107  CO2 23  --  20*  GLUCOSE 107*  --  87  BUN 19  --  17  CREATININE 0.77  --  0.81  CALCIUM 8.4*  --  8.2*  MG  --  2.1 1.7   Liver Function Tests:  Recent Labs Lab 11/11/16 1604 11/12/16 0903  AST 89* 57*  ALT 93* 59*  ALKPHOS 102 76  BILITOT 0.9 0.8  PROT 7.5 5.8*  ALBUMIN 3.7 3.0*   Coagulation Profile:  Recent Labs Lab 11/11/16 2029  INR 1.21   Cardiac Enzymes: No results for input(s): CKTOTAL, CKMB, CKMBINDEX, TROPONINI in the last 168 hours. HbA1C: No results for input(s): HGBA1C in the last 72 hours. CBG: No results for input(s): GLUCAP in the last 168 hours.  No results found for this or any previous visit (from the past 240 hour(s)).       Radiology Studies: No results found.      Scheduled Meds: . clonazePAM  0.5 mg Oral BID  . enoxaparin (LOVENOX) injection  40 mg Subcutaneous Q24H  . famotidine  20 mg Oral BID  . folic acid  1 mg Oral Daily  . multivitamin with minerals  1 tablet Oral Daily  . sodium chloride flush  3 mL Intravenous Q12H  . thiamine  100 mg Oral Daily   Continuous Infusions: . sodium chloride 75 mL/hr at 11/14/16 2152     LOS: 4 days     Chatara Lucente, MD, FACP, FHM. [facility] Pager [phone]  [phone]  If 7PM-7AM, please contact night-coverage [email or web address] Password [facility] 11/15/2016, 2:05 PM

## 2016-11-15 NOTE — Plan of Care (Signed)
Problem: Health Behavior/Discharge Planning: Goal: Identification of resources available to assist in meeting health care needs will improve Outcome: Progressing Patient states she was going to [place] meetings at home.  Problem: Physical Regulation: Goal: Complications related to the disease process, condition or treatment will be avoided or minimized Outcome: Progressing Patient required Ativan twice on night shift. She was tearful inquiring how long this would last. Educated patient that it takes time and it is a process.   Problem: Physical Regulation: Goal: Ability to maintain clinical measurements within normal limits will improve Outcome: Progressing Patient ambulated in hallway independently multiple times during day shift and at beginning of night shift.

## 2016-11-16 NOTE — Progress Notes (Signed)
PROGRESS NOTE   Kristine Macias  CNO:[id number]    DOB: [birth year]    DOA: 11/11/2016  PCP: No PCP Per Patient   I have briefly reviewed patients previous medical records in [facility].  Brief Narrative:  31 year old female with PMH of alcohol dependence, prior alcoholic hepatitis, depression, anxiety, seizures (alcohol withdrawal and may be unrelated also-follows at [facility]), presented to ED with complaints of severe anxiety, tremors and unwitnessed seizure in the setting of alcohol withdrawal. She states that she first had seizures approximately 2 years ago which was unrelated to alcohol abuse and apparently has been on Klonopin and followed at [facility]. She had cut down from her usual 24 ounce beers 5 per day to 2 per day when above symptoms occurred. In ED, not hypoxic, was mildly tachycardic, hypertensive, pregnancy test negative. She was admitted to stepdown for management of alcohol withdrawal complicated by seizures. Improving slowly.   Assessment & Plan:   Principal Problem:   Seizure due to alcohol withdrawal ([place]) Active Problems:   Generalized anxiety disorder   Alcoholic hepatitis without ascites   Alcohol abuse   Alcohol withdrawal syndrome with complication (HCC)   Leukocytosis   Hypertensive urgency   Alcohol withdrawal seizures - As stated above, patient gives a prior history of seizures unrelated to alcohol dependence. However current episode seems to be related to alcohol withdrawal. - Treating per CIWA protocol. No further seizures since admission. - Resumed home dose of clonazepam 0.5 MG twice a day - She has been counseled that she should not drive until cleared by physician during outpatient follow-up. - MRI brain: Normal. EEG: Normal except for excess amount of diffuse low-voltage beta activity area   Alcohol dependence with withdrawal - Management as above. Continue multivitamins. - Had CIWA of 18-24 on 3/21 at night. Since midnight however CIWA scores  ranging between 0-1. Continue management for additional 24 hours. - Requested clinical social worker consultation to assist patient with outpatient alcohol abstinence resources. - Patient has done well during the daytime over the last 2 days but has had significant withdrawal symptoms at night including last night when her CIWA scores were 13 and 26 and patient was clearly symptomatic. Continue management until consistent improved symptoms and CIW scores for 24 hours before considering discharge.   Anxiety and depression - Stable without suicidal or homicidal ideations or audiovisual hallucinations.Continue clonazepam.  Anemia -May be dilutional. Stable.  Alcoholic hepatitis - Minimally elevated transaminases without GI symptoms except for decreased appetite.   Elevated blood pressures - Likely secondary to alcohol withdrawal. Improved.  Muscular right upper back pain Supportive treatment with ice pack, acetaminophen/NSAIDs. Improved.   DVT prophylaxis: Lovenox  Code Status: Full  Family Communication: None at bedside  Disposition: Admitted to stepdown. DC home when medically improved and stable.  Consultants:  None   Procedures:  None   Antimicrobials:  None    Subjective: As per patient and nursing report, she did well in the daytime over the last 2 days but had significant withdrawal symptoms overnight. Right upper back pain improved. Patient states that it usually takes 5-7 days for her withdrawal symptoms to improve. Feels better this morning. No anxiety, shakiness, nausea, headache.  ROS: Appetite improving . No suicidal or homicidal ideations or auditory or visual hallucinations. Overnight events noted   Objective:  Vitals:   11/16/16 0600 11/16/16 0700 11/16/16 0802 11/16/16 1132  BP: 102/68 104/79 104/78 127/81  Pulse: 64 61 (!) 57 78  Resp: 14 16 16  12  Temp:   98.4 F (36.9 C) 98.4 F (36.9 C)  TempSrc:   Oral Oral  SpO2: 96% 96% 96% 97%  Weight:        Height:        Examination: Patient was examined with her female RN in the room.  General exam: Pleasant young female lying comfortably supine in bed.  Respiratory system: Clear to auscultation. Respiratory effort normal. Cardiovascular system: S1 & S2 heard, RRR. No JVD, murmurs, rubs, gallops or clicks. No pedal edema.Telemetry: Sinus rhythm.  Gastrointestinal system: Abdomen is nondistended, soft and nontender. No organomegaly or masses felt. Normal bowel sounds heard. Central nervous system: Alert and oriented. No focal neurological deficits. Extremities: Symmetric 5 x 5 power. Skin: No rashes, lesions or ulcers Psychiatry: Judgement and insight appear normal. Mood & affect appropriate.     Data Reviewed: I have personally reviewed following labs and imaging studies  CBC:  Recent Labs Lab 11/11/16 1604 11/12/16 0903 11/15/16 0313  WBC 10.7* 7.3 6.6  NEUTROABS 7.8* 3.8  --   HGB 15.6* 11.5* 11.6*  HCT 46.4* 35.8* 36.7  MCV 86.9 88.8 88.6  PLT 299 203 546   Basic Metabolic Panel:  Recent Labs Lab 11/11/16 1604 11/11/16 1856 11/12/16 0903  NA 137  --  136  K 4.0  --  3.7  CL 103  --  107  CO2 23  --  20*  GLUCOSE 107*  --  87  BUN 19  --  17  CREATININE 0.77  --  0.81  CALCIUM 8.4*  --  8.2*  MG  --  2.1 1.7   Liver Function Tests:  Recent Labs Lab 11/11/16 1604 11/12/16 0903  AST 89* 57*  ALT 93* 59*  ALKPHOS 102 76  BILITOT 0.9 0.8  PROT 7.5 5.8*  ALBUMIN 3.7 3.0*   Coagulation Profile:  Recent Labs Lab 11/11/16 2029  INR 1.21   Cardiac Enzymes: No results for input(s): CKTOTAL, CKMB, CKMBINDEX, TROPONINI in the last 168 hours. HbA1C: No results for input(s): HGBA1C in the last 72 hours. CBG: No results for input(s): GLUCAP in the last 168 hours.  No results found for this or any previous visit (from the past 240 hour(s)).       Radiology Studies: No results found.      Scheduled Meds: . clonazePAM  0.5 mg Oral BID  .  enoxaparin (LOVENOX) injection  40 mg Subcutaneous Q24H  . famotidine  20 mg Oral BID  . folic acid  1 mg Oral Daily  . multivitamin with minerals  1 tablet Oral Daily  . sodium chloride flush  3 mL Intravenous Q12H  . thiamine  100 mg Oral Daily   Continuous Infusions: . sodium chloride 50 mL/hr at 11/16/16 0515     LOS: 5 days     Vicky Mccanless, MD, FACP, FHM. [facility] Pager [phone]  If 7PM-7AM, please contact night-coverage [email or web address] Password [facility] 11/16/2016, 12:19 PM

## 2016-11-16 NOTE — Plan of Care (Signed)
Problem: Physical Regulation: Goal: Complications related to the disease process, condition or treatment will be avoided or minimized Outcome: Progressing Patient CIWA spiked once at 13 and then 26 last night. Withdrawal was manageable the rest of shift. Patient attempted to use klonopin instead of ativan and she stated that it worked and seemed to take away symptoms at first but then quickly her symptoms got out of hand and she noticed intense anxiety/ tremors. Consistently the patient has needed this type of coverage at or around 0000.

## 2016-11-17 MED ORDER — CLONAZEPAM 0.5 MG PO TABS
0.5000 mg | ORAL_TABLET | Freq: Three times a day (TID) | ORAL | Status: DC
  Administered 2016-11-17 – 2016-11-19 (×7): 0.5 mg via ORAL
  Filled 2016-11-17 (×7): qty 1

## 2016-11-17 MED ORDER — ONDANSETRON HCL 4 MG/2ML IJ SOLN
4.0000 mg | Freq: Four times a day (QID) | INTRAMUSCULAR | Status: DC | PRN
  Administered 2016-11-17 – 2016-11-18 (×2): 4 mg via INTRAVENOUS
  Filled 2016-11-17 (×2): qty 2

## 2016-11-17 NOTE — Progress Notes (Signed)
PROGRESS NOTE   [name]  CXK:[id number]    DOB: 1985-09-26    DOA: 11/11/2016  PCP: No PCP Per Patient   I have briefly reviewed patients previous medical records in [facility].  Brief Narrative:  31 year old female with PMH of alcohol dependence, prior alcoholic hepatitis, depression, anxiety, seizures (alcohol withdrawal and may be unrelated also-follows at [facility]), presented to ED with complaints of severe anxiety, tremors and unwitnessed seizure in the setting of alcohol withdrawal. She states that she first had seizures approximately 2 years ago which was unrelated to alcohol abuse and apparently has been on Klonopin and followed at [facility]. She had cut down from her usual 24 ounce beers 5 per day to 2 per day when above symptoms occurred. In ED, not hypoxic, was mildly tachycardic, hypertensive, pregnancy test negative. She was admitted to stepdown for management of alcohol withdrawal complicated by seizures. Improving slowly.   Assessment & Plan:   Principal Problem:   Seizure due to alcohol withdrawal ([place]) Active Problems:   Generalized anxiety disorder   Alcoholic hepatitis without ascites   Alcohol abuse   Alcohol withdrawal syndrome with complication (HCC)   Leukocytosis   Hypertensive urgency   Alcohol withdrawal seizures - As stated above, patient gives a prior history of seizures unrelated to alcohol dependence. However current episode seems to be related to alcohol withdrawal. - Treating per CIWA protocol. No further seizures since admission. - Resumed home dose of clonazepam 0.5 MG twice a day - She has been counseled that she should not drive until cleared by physician during outpatient follow-up. - MRI brain: Normal. EEG: Normal except for excess amount of diffuse low-voltage beta activity area   Alcohol dependence with withdrawal - Management as above. Continue multivitamins. - Requested clinical social worker consultation to assist patient with  outpatient alcohol abstinence resources. - She continues to have significant withdrawal symptoms. On 3/24, increased clonazepam to 0.5 MG 3 times a day. Continue CIWA Ativan protocol.  Anxiety and depression - Stable without suicidal or homicidal ideations or audiovisual hallucinations.Continue clonazepam.  Anemia -May be dilutional. Stable.  Alcoholic hepatitis - Minimally elevated transaminases without GI symptoms except for decreased appetite.   Elevated blood pressures - Likely secondary to alcohol withdrawal. Improved.  Muscular right upper back pain Supportive treatment with ice pack, acetaminophen/NSAIDs. Improved.   DVT prophylaxis: Lovenox  Code Status: Full  Family Communication: None at bedside  Disposition: Admitted to stepdown. DC home when medically improved and stable.  Consultants:  None   Procedures:  None   Antimicrobials:  None    Subjective: Seen this morning along with patient's RN. Complaints of feeling "bad". Ongoing nausea, headache, tremors witnessed.  ROS: Appetite improving . No suicidal or homicidal ideations or auditory or visual hallucinations. Overnight events noted   Objective:  Vitals:   11/17/16 0500 11/17/16 0600 11/17/16 0755 11/17/16 1205  BP:   117/86 116/80  Pulse: 60 (!) 59 76 84  Resp: 14 16  17   Temp:    98.2 F (36.8 C)  TempSrc:    Oral  SpO2: 97% 98%  97%  Weight:      Height:        Examination: Patient was examined with her female RN in the room.  General exam: Pleasant young female lying comfortably supine in bed. Having withdrawal with anxiety and shaking. Respiratory system: Clear to auscultation. Respiratory effort normal. Cardiovascular system: S1 & S2 heard, RRR. No JVD, murmurs, rubs, gallops or clicks. No  pedal edema.Telemetry: Sinus rhythm.  Gastrointestinal system: Abdomen is nondistended, soft and nontender. No organomegaly or masses felt. Normal bowel sounds heard. Central nervous system: Alert and  oriented. No focal neurological deficits. Extremities: Symmetric 5 x 5 power. Skin: No rashes, lesions or ulcers Psychiatry: Judgement and insight appear normal. Mood & affect appropriate.     Data Reviewed: I have personally reviewed following labs and imaging studies  CBC:  Recent Labs Lab 11/11/16 1604 11/12/16 0903 11/15/16 0313  WBC 10.7* 7.3 6.6  NEUTROABS 7.8* 3.8  --   HGB 15.6* 11.5* 11.6*  HCT 46.4* 35.8* 36.7  MCV 86.9 88.8 88.6  PLT 299 203 782   Basic Metabolic Panel:  Recent Labs Lab 11/11/16 1604 11/11/16 1856 11/12/16 0903  NA 137  --  136  K 4.0  --  3.7  CL 103  --  107  CO2 23  --  20*  GLUCOSE 107*  --  87  BUN 19  --  17  CREATININE 0.77  --  0.81  CALCIUM 8.4*  --  8.2*  MG  --  2.1 1.7   Liver Function Tests:  Recent Labs Lab 11/11/16 1604 11/12/16 0903  AST 89* 57*  ALT 93* 59*  ALKPHOS 102 76  BILITOT 0.9 0.8  PROT 7.5 5.8*  ALBUMIN 3.7 3.0*   Coagulation Profile:  Recent Labs Lab 11/11/16 2029  INR 1.21   Cardiac Enzymes: No results for input(s): CKTOTAL, CKMB, CKMBINDEX, TROPONINI in the last 168 hours. HbA1C: No results for input(s): HGBA1C in the last 72 hours. CBG: No results for input(s): GLUCAP in the last 168 hours.  No results found for this or any previous visit (from the past 240 hour(s)).       Radiology Studies: No results found.      Scheduled Meds: . clonazePAM  0.5 mg Oral TID  . enoxaparin (LOVENOX) injection  40 mg Subcutaneous Q24H  . famotidine  20 mg Oral BID  . folic acid  1 mg Oral Daily  . multivitamin with minerals  1 tablet Oral Daily  . sodium chloride flush  3 mL Intravenous Q12H  . thiamine  100 mg Oral Daily   Continuous Infusions: . sodium chloride 50 mL/hr at 11/17/16 0000     LOS: 6 days     Kristine Althouse, MD, FACP, FHM. [facility] Pager [phone]  If 7PM-7AM, please contact night-coverage [email or web address] Password [facility] 11/17/2016, 2:32 PM

## 2016-11-18 LAB — CBC
HCT: 38.2 % (ref 36.0–46.0)
HEMOGLOBIN: 12 g/dL (ref 12.0–15.0)
MCH: 28.5 pg (ref 26.0–34.0)
MCHC: 31.4 g/dL (ref 30.0–36.0)
MCV: 90.7 fL (ref 78.0–100.0)
PLATELETS: 231 10*3/uL (ref 150–400)
RBC: 4.21 MIL/uL (ref 3.87–5.11)
RDW: 17.9 % — ABNORMAL HIGH (ref 11.5–15.5)
WBC: 6.9 10*3/uL (ref 4.0–10.5)

## 2016-11-18 LAB — COMPREHENSIVE METABOLIC PANEL
ALK PHOS: 73 U/L (ref 38–126)
ALT: 95 U/L — AB (ref 14–54)
ANION GAP: 10 (ref 5–15)
AST: 84 U/L — ABNORMAL HIGH (ref 15–41)
Albumin: 3.3 g/dL — ABNORMAL LOW (ref 3.5–5.0)
BUN: 9 mg/dL (ref 6–20)
CHLORIDE: 106 mmol/L (ref 101–111)
CO2: 23 mmol/L (ref 22–32)
CREATININE: 0.52 mg/dL (ref 0.44–1.00)
Calcium: 9 mg/dL (ref 8.9–10.3)
GFR calc non Af Amer: >60 mL/min (ref >60)
GLUCOSE: 92 mg/dL (ref 65–99)
Potassium: 3.8 mmol/L (ref 3.5–5.1)
SODIUM: 139 mmol/L (ref 135–145)
Total Bilirubin: 0.5 mg/dL (ref 0.3–1.2)
Total Protein: 6.5 g/dL (ref 6.5–8.1)

## 2016-11-18 NOTE — Progress Notes (Signed)
PROGRESS NOTE   Kristine Macias  KZS:[id number]    DOB: [birth year]    DOA: 11/11/2016  PCP: No PCP Per Patient   I have briefly reviewed patients previous medical records in [facility].  Brief Narrative:  31 year old female with PMH of alcohol dependence, prior alcoholic hepatitis, depression, anxiety, seizures (alcohol withdrawal and may be unrelated also-follows at [facility]), presented to ED with complaints of severe anxiety, tremors and unwitnessed seizure in the setting of alcohol withdrawal. She states that she first had seizures approximately 2 years ago which was unrelated to alcohol abuse and apparently has been on Klonopin and followed at [facility]. She had cut down from her usual 24 ounce beers 5 per day to 2 per day when above symptoms occurred. In ED, not hypoxic, was mildly tachycardic, hypertensive, pregnancy test negative. She was admitted to stepdown for management of alcohol withdrawal complicated by seizures. Improving slowly.   Assessment & Plan:   Principal Problem:   Seizure due to alcohol withdrawal ([place]) Active Problems:   Generalized anxiety disorder   Alcoholic hepatitis without ascites   Alcohol abuse   Alcohol withdrawal syndrome with complication (HCC)   Leukocytosis   Hypertensive urgency   Alcohol withdrawal seizures - As stated above, patient gives a prior history of seizures unrelated to alcohol dependence. However current episode seems to be related to alcohol withdrawal. - Treating per CIWA protocol. No further seizures since admission. - Resumed home dose of clonazepam 0.5 MG twice a day - She has been counseled that she should not drive until cleared by physician during outpatient follow-up. - MRI brain: Normal. EEG: Normal except for excess amount of diffuse low-voltage beta activity area   Alcohol dependence with withdrawal - Management as above. Continue multivitamins. - Requested clinical social worker consultation to assist patient with  outpatient alcohol abstinence resources. - She continued to have significant withdrawal symptoms. On 3/24, increased clonazepam to 0.5 MG 3 times a day. Continue CIWA Ativan protocol. - Clinically starting to improve, CIWA scores decreasing. Continue current management for 24 hours and consider DC in a.m.  Anxiety and depression - Stable without suicidal or homicidal ideations or audiovisual hallucinations.Continue clonazepam.  Anemia -May be dilutional. Stable.  Alcoholic hepatitis - Minimally elevated transaminases without GI symptoms except for decreased appetite. LFT stable.   Elevated blood pressures - Likely secondary to alcohol withdrawal. Improved.  Muscular right upper back pain Supportive treatment with ice pack, acetaminophen/NSAIDs. Seems to have resolved.   DVT prophylaxis: Lovenox  Code Status: Full  Family Communication: None at bedside  Disposition: Admitted to stepdown. DC home when medically improved and stable. DC home? 3/26   Consultants:  None   Procedures:  None   Antimicrobials:  None    Subjective: Overall feels better than she did over the last 2-3 nights. States less withdrawal symptoms. Some nausea and transient dizziness overnight. Increasing IV fluids.  ROS: Appetite improving . No suicidal or homicidal ideations or auditory or visual hallucinations. Overnight events noted   Objective:  Vitals:   11/18/16 0500 11/18/16 0513 11/18/16 0600 11/18/16 0752  BP:    100/70  Pulse: (!) 55 71 (!) 56 (!) 58  Resp: (!) 28 (!) 24 15 14   Temp:  97.9 F (36.6 C)  97.8 F (36.6 C)  TempSrc:  Oral  Oral  SpO2: 98% 97% 96% 97%  Weight:      Height:        Examination: Patient was examined with her female Therapist, sports  in the room.  General exam: Pleasant young female lying comfortably supine in bed. Having withdrawal with anxiety and shaking. Respiratory system: Clear to auscultation. Respiratory effort normal. Cardiovascular system: S1 & S2 heard, RRR.  No JVD, murmurs, rubs, gallops or clicks. No pedal edema.Telemetry: SB-SR. Occ ST in 140's. Gastrointestinal system: Abdomen is nondistended, soft and nontender. No organomegaly or masses felt. Normal bowel sounds heard. Central nervous system: Alert and oriented. No focal neurological deficits. Extremities: Symmetric 5 x 5 power. Skin: No rashes, lesions or ulcers Psychiatry: Judgement and insight appear normal. Mood & affect appropriate.     Data Reviewed: I have personally reviewed following labs and imaging studies  CBC:  Recent Labs Lab 11/11/16 1604 11/12/16 0903 11/15/16 0313 11/18/16 0352  WBC 10.7* 7.3 6.6 6.9  NEUTROABS 7.8* 3.8  --   --   HGB 15.6* 11.5* 11.6* 12.0  HCT 46.4* 35.8* 36.7 38.2  MCV 86.9 88.8 88.6 90.7  PLT 299 203 176 579   Basic Metabolic Panel:  Recent Labs Lab 11/11/16 1604 11/11/16 1856 11/12/16 0903 11/18/16 0627  NA 137  --  136 139  K 4.0  --  3.7 3.8  CL 103  --  107 106  CO2 23  --  20* 23  GLUCOSE 107*  --  87 92  BUN 19  --  17 9  CREATININE 0.77  --  0.81 0.52  CALCIUM 8.4*  --  8.2* 9.0  MG  --  2.1 1.7  --    Liver Function Tests:  Recent Labs Lab 11/11/16 1604 11/12/16 0903 11/18/16 0627  AST 89* 57* 84*  ALT 93* 59* 95*  ALKPHOS 102 76 73  BILITOT 0.9 0.8 0.5  PROT 7.5 5.8* 6.5  ALBUMIN 3.7 3.0* 3.3*   Coagulation Profile:  Recent Labs Lab 11/11/16 2029  INR 1.21   Cardiac Enzymes: No results for input(s): CKTOTAL, CKMB, CKMBINDEX, TROPONINI in the last 168 hours. HbA1C: No results for input(s): HGBA1C in the last 72 hours. CBG: No results for input(s): GLUCAP in the last 168 hours.  No results found for this or any previous visit (from the past 240 hour(s)).       Radiology Studies: No results found.      Scheduled Meds: . clonazePAM  0.5 mg Oral TID  . enoxaparin (LOVENOX) injection  40 mg Subcutaneous Q24H  . famotidine  20 mg Oral BID  . folic acid  1 mg Oral Daily  . multivitamin  with minerals  1 tablet Oral Daily  . sodium chloride flush  3 mL Intravenous Q12H  . thiamine  100 mg Oral Daily   Continuous Infusions: . sodium chloride 100 mL/hr at 11/18/16 1013     LOS: 7 days     Zyhir Cappella, MD, FACP, FHM. [facility] Pager [phone]  If 7PM-7AM, please contact night-coverage [email or web address] Password [facility] 11/18/2016, 10:57 AM

## 2016-11-18 NOTE — Plan of Care (Signed)
Problem: Physical Regulation: Goal: Ability to maintain clinical measurements within normal limits will improve Outcome: Progressing Discussed with the patient about CIWA scale and it trending down with some teach back displayed.

## 2016-11-18 NOTE — Plan of Care (Signed)
Problem: Physical Regulation: Goal: Ability to maintain clinical measurements within normal limits will improve Outcome: Progressing Discussed plan of care with the patient tonight to control nausea with some teach back displayed.

## 2016-11-19 NOTE — Discharge Summary (Signed)
Physician Discharge Summary  Kristine Macias DJT:[id number] DOB: 04-22-1986  PCP: Leonides Sake, MD  Admit date: 11/11/2016 Discharge date: 11/19/2016  Recommendations for Outpatient Follow-up:  1. Dr. Daiva Eves, PCP in 3 days. Will need periodic monitoring of her LFTs.  Home Health: None Equipment/Devices: None    Discharge Condition: Improved and stable  CODE STATUS: Full  Diet recommendation: Regular diet  Discharge Diagnoses:  Principal Problem:   Seizure due to alcohol withdrawal ([place]) Active Problems:   Generalized anxiety disorder   Alcoholic hepatitis without ascites   Alcohol abuse   Alcohol withdrawal syndrome with complication (HCC)   Leukocytosis   Hypertensive urgency   Brief Summary: 31 year old female with PMH of alcohol dependence, prior alcoholic hepatitis, depression, anxiety, seizures (alcohol withdrawal and may be unrelated also-follows at [facility]), presented to ED with complaints of severe anxiety, tremors and unwitnessed seizure in the setting of alcohol withdrawal. She states that she first had seizures approximately 2 years ago which was unrelated to alcohol abuse and has been on Klonopin and apparently followed at [facility]. She had cut down from her usual 24 ounce beers 5 per day to 2 per day when above symptoms occurred. In ED, not hypoxic, was mildly tachycardic, hypertensive, pregnancy test negative. She was admitted to stepdown for management of alcohol withdrawal complicated by seizures.    Assessment & Plan:   Alcohol withdrawal seizures - As stated above, patient gives a prior history of seizures unrelated to alcohol dependence. However current episode seems to be related to alcohol withdrawal. - Treated per CIWA protocol and continued home clonazepam. No further seizures since admission. - She has been counseled that she should not drive until cleared by physician during outpatient follow-up. Patient indicates that she does not drive. - MRI  brain: Normal. EEG: Normal except for excess amount of diffuse low-voltage beta activity area  - Patient has been counseled extensively in multiple times regarding importance of abstinence from alcohol and she verbalizes understanding.  Alcohol dependence with withdrawal - She was treated per CIWA protocol, thiamine, Folate and MVI's. Her withdrawal symptoms were slow to improve. She was restarted on home dose of Klonopin which was temporarily increased from 0.5 MG 2 times daily to 0.5 MG 3 times daily. - She has gradually improved. No clinical withdrawal signs or symptoms. CIWA scores low.  - As per discussion with RN, patient has resources for outpatient alcohol dependence management including appointment with AA.  Anxiety and depression - Stable without suicidal or homicidal ideations or audiovisual hallucinations.Continue clonazepam.  Anemia -May be dilutional. Stable.  Alcoholic hepatitis - Minimally elevated transaminases without GI symptoms except for decreased appetite. LFT stable. Outpatient follow-up.   Elevated blood pressures - Likely secondary to alcohol withdrawal. Improved/resolved.  Muscular right upper back pain Supportive treatment with ice pack, acetaminophen/NSAIDs. Resolved.  Asymptomatic bacteriuria.   Consultants:  None   Procedures:  EEG 11/13/16:  Impression: This awake and drowsy EEG is normal except for excess amount of diffuse low voltage beta activity.  Clinical Correlation: Diffuse low voltage beta activity is commonly seen with sedating medications such as benzodiazepines.  In the absence of sedating medications, anxiety and hyperthyroidism may produce generalized beta activity.  The absence of epileptiform discharges does not exclude a clinical diagnosis of epilepsy. Clinical correlation is advised.  Discharge Instructions  Discharge Instructions    Call MD for:    Complete by:  As directed    Seizure like activity.   Call MD for:   extreme  fatigue    Complete by:  As directed    Call MD for:  persistant dizziness or light-headedness    Complete by:  As directed    Call MD for:  persistant nausea and vomiting    Complete by:  As directed    Call MD for:  severe uncontrolled pain    Complete by:  As directed    Diet general    Complete by:  As directed    Driving Restrictions    Complete by:  As directed    Do not drive until cleared by your physician during outpatient follow-up.   Increase activity slowly    Complete by:  As directed        Medication List    STOP taking these medications   nicotine 21 mg/24hr patch Commonly known as:  NICODERM CQ - dosed in mg/24 hours     TAKE these medications   clonazePAM 0.5 MG tablet Commonly known as:  KLONOPIN Take 0.5 mg by mouth 2 (two) times daily. Pt does not take when she has been drinking   folic acid 1 MG tablet Commonly known as:  FOLVITE Take 1 tablet (1 mg total) by mouth daily.   multivitamin with minerals Tabs tablet Take 1 tablet by mouth daily.   ondansetron 4 MG tablet Commonly known as:  ZOFRAN Take 1 tablet by mouth every 6 (six) hours as needed for nausea/vomiting.   thiamine 100 MG tablet Take 1 tablet (100 mg total) by mouth daily.      Follow-up Information    HAMRICK,MAURA L, MD. Schedule an appointment as soon as possible for a visit in 3 day(s).   Specialty:  Family Medicine Why:  Will need periodic follow-up of liver function tests. Contact information: [place] [address] [phone]          Allergies  Allergen Reactions  . Aspirin Shortness Of Breath  . Effexor [Venlafaxine] Anaphylaxis  . Gabapentin Anaphylaxis  . Sulfa Antibiotics Anaphylaxis  . Tetracyclines & Related Anaphylaxis  . Trazodone And Nefazodone Anaphylaxis  . Latex Hives and Itching  . Paxil [Paroxetine] Hives and Itching  . Seroquel [Quetiapine Fumarate] Hives and Itching  . Zoloft [Sertraline Hcl] Hives and Itching       Procedures/Studies: Mr Jeri Cos Wo Contrast  Result Date: 11/12/2016 CLINICAL DATA:  Seizure, alcohol withdrawal. EXAM: MRI HEAD WITHOUT AND WITH CONTRAST TECHNIQUE: Multiplanar, multiecho pulse sequences of the brain and surrounding structures were obtained without and with intravenous contrast. CONTRAST:  38m MULTIHANCE GADOBENATE DIMEGLUMINE 529 MG/ML IV SOLN COMPARISON:  Head CT 09/21/2016 FINDINGS: Brain: No focal diffusion restriction to indicate acute infarct. No intraparenchymal hemorrhage. The brain parenchymal signal is normal. No mass lesion or midline shift. No hydrocephalus or extra-axial fluid collection. The midline structures are normal. No age advanced or lobar predominant atrophy. Vascular: Major intracranial arterial and venous sinus flow voids are preserved. No evidence of chronic microhemorrhage or amyloid angiopathy. Skull and upper cervical spine: The visualized skull base, calvarium, upper cervical spine and extracranial soft tissues are normal. Sinuses/Orbits: No fluid levels or advanced mucosal thickening. No mastoid effusion. Normal orbits. IMPRESSION: Normal MRI of the brain. Electronically Signed   By: KUlyses JarredM.D.   On: 11/12/2016 04:57      Subjective: States that she feels much better. Denies any symptoms suggestive of alcohol withdrawal. Did not report any complaints. No report of headache, anxiety, tremors, nausea or vomiting. As per RN, has been  ambulating in the halls multiple times. Has not received any lorazepam in the last 24 hours. No seizures for several days.  Discharge Exam:  Vitals:   11/19/16 0400 11/19/16 0500 11/19/16 0600 11/19/16 0754  BP:    129/72  Pulse: (!) 56 (!) 51 (!) 52   Resp: 15 15 13    Temp:    98.1 F (36.7 C)  TempSrc:    Oral  SpO2: 95% 95% 96%   Weight:      Height:        Patient was examined with her female RN in the room.  General exam: Pleasant young female lying comfortably supine in bed. Having withdrawal  with anxiety and shaking. Respiratory system: Clear to auscultation. Respiratory effort normal. Cardiovascular system: S1 & S2 heard, RRR. No JVD, murmurs, rubs, gallops or clicks. No pedal edema.Telemetry: Sinus rhythm. Sinus bradycardia in the 50s, asymptomatic, probably when asleep at night. Gastrointestinal system: Abdomen is nondistended, soft and nontender. No organomegaly or masses felt. Normal bowel sounds heard. Central nervous system: Alert and oriented. No focal neurological deficits. Extremities: Symmetric 5 x 5 power. Skin: No rashes, lesions or ulcers Psychiatry: Judgement and insight appear normal. Mood & affect appropriate.     The results of significant diagnostics from this hospitalization (including imaging, microbiology, ancillary and laboratory) are listed below for reference.     Microbiology: No results found for this or any previous visit (from the past 240 hour(s)).   Labs: CBC:  Recent Labs Lab 11/15/16 0313 11/18/16 0352  WBC 6.6 6.9  HGB 11.6* 12.0  HCT 36.7 38.2  MCV 88.6 90.7  PLT 176 098   Basic Metabolic Panel:  Recent Labs Lab 11/18/16 0627  NA 139  K 3.8  CL 106  CO2 23  GLUCOSE 92  BUN 9  CREATININE 0.52  CALCIUM 9.0   Liver Function Tests:  Recent Labs Lab 11/18/16 0627  AST 84*  ALT 95*  ALKPHOS 73  BILITOT 0.5  PROT 6.5  ALBUMIN 3.3*    Urinalysis    Component Value Date/Time   COLORURINE AMBER (A) 11/12/2016 1545   APPEARANCEUR CLOUDY (A) 11/12/2016 1545   APPEARANCEUR Clear 02/15/2016 1456   LABSPEC 1.035 (H) 11/12/2016 1545   LABSPEC 1.006 12/20/2014 1018   PHURINE 5.0 11/12/2016 1545   GLUCOSEU NEGATIVE 11/12/2016 1545   GLUCOSEU Negative 12/20/2014 1018   HGBUR SMALL (A) 11/12/2016 1545   BILIRUBINUR NEGATIVE 11/12/2016 1545   BILIRUBINUR neg 03/13/2016 1700   BILIRUBINUR Negative 02/15/2016 1456   BILIRUBINUR Negative 12/20/2014 1018   KETONESUR NEGATIVE 11/12/2016 1545   PROTEINUR NEGATIVE  11/12/2016 1545   UROBILINOGEN negative 03/13/2016 1700   UROBILINOGEN 0.2 06/13/2012 2306   NITRITE NEGATIVE 11/12/2016 1545   LEUKOCYTESUR NEGATIVE 11/12/2016 1545   LEUKOCYTESUR Negative 02/15/2016 1456   LEUKOCYTESUR Negative 12/20/2014 1018      Time coordinating discharge: Over 30 minutes  SIGNED:  Vernell Leep, MD, FACP, FHM. [facility] Pager [phone]  If 7PM-7AM, please contact night-coverage [email or web address] Password [email or web address] 11/19/2016, 11:03 AM

## 2016-11-19 NOTE — Discharge Instructions (Addendum)
Alcohol Use Disorder Alcohol use disorder is when your drinking disrupts your daily life. When you have this condition, you drink too much alcohol and you cannot control your drinking. Alcohol use disorder can cause serious problems with your physical health. It can affect your brain, heart, liver, pancreas, immune system, stomach, and intestines. Alcohol use disorder can increase your risk for certain cancers and cause problems with your mental health, such as depression, anxiety, psychosis, delirium, and dementia. People with this disorder risk hurting themselves and others. What are the causes? This condition is caused by drinking too much alcohol over time. It is not caused by drinking too much alcohol only one or two times. Some people with this condition drink alcohol to cope with or escape from negative life events. Others drink to relieve pain or symptoms of mental illness. What increases the risk? You are more likely to develop this condition if:  You have a family history of alcohol use disorder.  Your culture encourages drinking to the point of intoxication, or makes alcohol easy to get.  You had a mood or conduct disorder in childhood.  You have been a victim of abuse.  You are an adolescent and:  You have poor grades or difficulties in school.  Your caregivers do not talk to you about saying no to alcohol, or supervise your activities.  You are impulsive or you have trouble with self-control. What are the signs or symptoms? Symptoms of this condition include:  Drinkingmore than you want to.  Drinking for longer than you want to.  Trying several times to drink less or to control your drinking.  Spending a lot of time getting alcohol, drinking, or recovering from drinking.  Craving alcohol.  Having problems at work, at school, or at home due to drinking.  Having problems in relationships due to drinking.  Drinking when it is dangerous to drink, such as before  driving a car.  Continuing to drink even though you know you might have a physical or mental problem related to drinking.  Needing more and more alcohol to get the same effect you want from the alcohol (building up tolerance).  Having symptoms of withdrawal when you stop drinking. Symptoms of withdrawal include:  Fatigue.  Nightmares.  Trouble sleeping.  Depression.  Anxiety.  Fever.  Seizures.  Severe confusion.  Feeling or seeing things that are not there (hallucinations).  Tremors.  Rapid heart rate.  Rapid breathing.  High blood pressure.  Drinking to avoid symptoms of withdrawal. How is this diagnosed? This condition is diagnosed with an assessment. Your health care provider may start the assessment by asking three or four questions about your drinking. Your health care provider may perform a physical exam or do lab tests to see if you have physical problems resulting from alcohol use. She or he may refer you to a mental health professional for evaluation. How is this treated? Some people with alcohol use disorder are able to reduce their alcohol use to low-risk levels. Others need to completely quit drinking alcohol. When necessary, mental health professionals with specialized training in substance use treatment can help. Your health care provider can help you decide how severe your alcohol use disorder is and what type of treatment you need. The following forms of treatment are available:  Detoxification. Detoxification involves quitting drinking and using prescription medicines within the first week to help lessen withdrawal symptoms. This treatment is important for people who have had withdrawal symptoms before and for heavy drinkers  who are likely to have withdrawal symptoms. Alcohol withdrawal can be dangerous, and in severe cases, it can cause death. Detoxification may be provided in a home, community, or primary care setting, or in a hospital or substance use  treatment facility.  Counseling. This treatment is also called talk therapy. It is provided by substance use treatment counselors. A counselor can address the reasons you use alcohol and suggest ways to keep you from drinking again or to prevent problem drinking. The goals of talk therapy are to:  Find healthy activities and ways for you to cope with stress.  Identify and avoid the things that trigger your alcohol use.  Help you learn how to handle cravings.  Medicines.Medicines can help treat alcohol use disorder by:  Decreasing alcohol cravings.  Decreasing the positive feeling you have when you drink alcohol.  Causing an uncomfortable physical reaction when you drink alcohol (aversion therapy).  Support groups. Support groups are led by people who have quit drinking. They provide emotional support, advice, and guidance. These forms of treatment are often combined. Some people with this condition benefit from a combination of treatments provided by specialized substance use treatment centers. Follow these instructions at home:  Take over-the-counter and prescription medicines only as told by your health care provider.  Check with your health care provider before starting any new medicines.  Ask friends and family members not to offer you alcohol.  Avoid situations where alcohol is served, including gatherings where others are drinking alcohol.  Create a plan for what to do when you are tempted to use alcohol.  Find hobbies or activities that you enjoy that do not include alcohol.  Keep all follow-up visits as told by your health care provider. This is important. How is this prevented?  If you drink, limit alcohol intake to no more than 1 drink a day for nonpregnant women and 2 drinks a day for men. One drink equals 12 oz of beer, 5 oz of wine, or 1 oz of hard liquor.  If you have a mental health condition, get treatment and support.  Do not give alcohol to  adolescents.  If you are an adolescent:  Do not drink alcohol.  Do not be afraid to say no if someone offers you alcohol. Speak up about why you do not want to drink. You can be a positive role model for your friends and set a good example for those around you by not drinking alcohol.  If your friends drink, spend time with others who do not drink alcohol. Make new friends who do not use alcohol.  Find healthy ways to manage stress and emotions, such as meditation or deep breathing, exercise, spending time in nature, listening to music, or talking with a trusted friend or family member. Contact a health care provider if:  You are not able to take your medicines as told.  Your symptoms get worse.  You return to drinking alcohol (relapse) and your symptoms get worse. Get help right away if:  You have thoughts about hurting yourself or others. If you ever feel like you may hurt yourself or others, or have thoughts about taking your own life, get help right away. You can go to your nearest emergency department or call:  Your local emergency services (911 in the [place]  A suicide crisis helpline, such as the [facility] at 7472094686. This is open 24 hours a day. Summary  Alcohol use disorder is when your drinking disrupts your daily  life. When you have this condition, you drink too much alcohol and you cannot control your drinking.  Treatment may include detoxification, counseling, medicine, and support groups.  Ask friends and family members not to offer you alcohol. Avoid situations where alcohol is served.  Get help right away if you have thoughts about hurting yourself or others. This information is not intended to replace advice given to you by your health care provider. Make sure you discuss any questions you have with your health care provider. Document Released: 09/20/2004 Document Revised: 05/10/2016 Document Reviewed: 05/10/2016 Elsevier  Interactive Patient Education  2017 [place].   Alcohol Withdrawal Alcohol withdrawal is a group of symptoms that can develop when a person who drinks heavily and regularly stops drinking or drinks less. What are the causes? Heavy and regular drinking can cause chemicals that send signals from the brain to the body (neurotransmitters) to deactivate. Alcohol withdrawal develops when deactivated neurotransmitters reactivate because a person stops drinking or drinks less. What increases the risk? The more a person drinks and the longer he or she drinks, the greater the risk of alcohol withdrawal. Severe withdrawal is more likely to develop in someone who:  Had severe alcohol withdrawal in the past.  Had a seizure during a previous episode of alcohol withdrawal.  Is elderly.  Is pregnant.  Has been abusing drugs.  Has other medical problems, including:  Infection.  Heart, lung, or liver disease.  Seizures.  Mental health problems. What are the signs or symptoms? Symptoms of this condition can be mild to moderate, or they can be severe. Mild to moderate symptoms may include:  Fatigue.  Nightmares.  Trouble sleeping.  Depression.  Anxiety.  Inability to think clearly.  Mood swings.  Irritability.  Loss of appetite.  Nausea or vomiting.  Clammy skin.  Extreme sweating.  Rapid heartbeat.  Shakiness.  Uncontrollable shaking (tremor). Severe symptoms may include:  Fever.  Seizures.  Severeconfusion.  Feeling or seeing things that are not there (hallucinations). Symptoms usually begin within eight hours after a person stops drinking or drinks less. They can last for weeks. How is this diagnosed? Alcohol withdrawal is diagnosed with a medical history and physical exam. Sometimes, urine and blood tests are also done. How is this treated? Treatment may involve:  Monitoring blood pressure, pulse, and breathing.  Getting fluids through an IV  tube.  Medicine to reduce anxiety.  Medicine to prevent or control seizures.  Multivitamins and B vitamins.  Having a health care provider check on you daily. If symptoms are moderate to severe or if there is a risk of severe withdrawal, treatment may be done at a hospital or treatment center. Follow these instructions at home:  Take medicines and vitamin supplements only as directed by your health care provider.  Do not drink alcohol.  Have someone stay with you or be available if you need help.  Drink enough fluid to keep your urine clear or pale yellow.  Consider joining a 12-step program or another alcohol support group. Contact a health care provider if:  Your symptoms get worse or do not go away.  You cannot keep food or water in your stomach.  You are struggling with not drinking alcohol.  You cannot stop drinking alcohol. Get help right away if:  You have an irregular heartbeat.  You have chest pain.  You have trouble breathing.  You have symptoms of severe withdrawal, such as:  A fever.  Seizures.  Severe confusion.  Hallucinations. This  information is not intended to replace advice given to you by your health care provider. Make sure you discuss any questions you have with your health care provider. Document Released: 05/23/2005 Document Revised: 12/21/2015 Document Reviewed: 06/01/2014 Elsevier Interactive Patient Education  2017 [employer].   Epilepsy Epilepsy is when a person keeps having seizures. A seizure is unusual activity in the brain. A seizure can change how you think or behave, and it can make it hard to be aware of what is happening. This condition can cause problems, such as:  Falls, accidents, and injury.  Depression.  Poor memory.  Sudden unexplained death in epilepsy (SUDEP). This is rare. Its cause is not known. Most people with epilepsy lead normal lives. Follow these instructions at home: Medicines    Take medicines  only as told by your doctor.  Avoid anything that may keep your medicine from working, such as alcohol. Activity   Get enough rest. Lack of sleep can make seizures more likely to occur.  Follow your doctors advice about driving, swimming, and doing anything else that would be dangerous if you had a seizure. Teaching others  Teach friends and family what to do if you have a seizure. They should:  Lay you on the ground to prevent a fall.  Cushion your head and body.  Loosen any tight clothing around your neck.  Turn you on your side.  Stay with you until you are better.  Not hold you down.  Not put anything in your mouth.  Know whether or not you need emergency care. General instructions   Avoid anything that causes you to have seizures.  Keep a seizure diary. Write down what you remember about each seizure, and especially what might have caused it.  Keep all follow-up visits as told by your doctor. This is important. Contact a doctor if:  You have a change in your seizure pattern.  You get an infection or start to feel sick. You may have more seizures when you are sick. Get help right away if:  A seizure does not stop after 5 minutes.  You have more than one seizure in a row, and you do not have enough time between the seizures to feel better.  A seizure makes it harder to breathe.  A seizure is different from other seizures you have had.  A seizure makes you unable to speak or use a part of your body.  You did not wake up right after a seizure. This information is not intended to replace advice given to you by your health care provider. Make sure you discuss any questions you have with your health care provider. Document Released: 06/10/2009 Document Revised: 03/19/2016 Document Reviewed: 02/21/2016 Elsevier Interactive Patient Education  2017 [place].   Additional discharge instructions  Please get your medications reviewed and adjusted by your  Primary MD.  Please request your Primary MD to go over all Hospital Tests and Procedure/Radiological results at the follow up, please get all Hospital records sent to your Prim MD by signing hospital release before you go home.  If you had Pneumonia of Lung problems at the Hospital: Please get a 2 view Chest X ray done in 6-8 weeks after hospital discharge or sooner if instructed by your Primary MD.  If you have Congestive Heart Failure: Please call your Cardiologist or Primary MD anytime you have any of the following symptoms:  1) 3 pound weight gain in 24 hours or 5 pounds in 1 week  2) shortness of breath, with or without a dry hacking cough  3) swelling in the hands, feet or stomach  4) if you have to sleep on extra pillows at night in order to breathe  Follow cardiac low salt diet and 1.5 lit/day fluid restriction.  If you have diabetes Accuchecks 4 times/day, Once in AM empty stomach and then before each meal. Log in all results and show them to your primary doctor at your next visit. If any glucose reading is under 80 or above 300 call your primary MD immediately.  If you have Seizure/Convulsions/Epilepsy: Please do not drive, operate heavy machinery, participate in activities at heights or participate in high speed sports until you have seen by Primary MD or a Neurologist and advised to do so again.  If you had Gastrointestinal Bleeding: Please ask your Primary MD to check a complete blood count within one week of discharge or at your next visit. Your endoscopic/colonoscopic biopsies that are pending at the time of discharge, will also need to followed by your Primary MD.  Get Medicines reviewed and adjusted. Please take all your medications with you for your next visit with your Primary MD  Please request your Primary MD to go over all hospital tests and procedure/radiological results at the follow up, please ask your Primary MD to get all Hospital records sent to his/her  office.  If you experience worsening of your admission symptoms, develop shortness of breath, life threatening emergency, suicidal or homicidal thoughts you must seek medical attention immediately by calling 911 or calling your MD immediately  if symptoms less severe.  You must read complete instructions/literature along with all the possible adverse reactions/side effects for all the Medicines you take and that have been prescribed to you. Take any new Medicines after you have completely understood and accpet all the possible adverse reactions/side effects.   Do not drive or operate heavy machinery when taking Pain medications.   Do not take more than prescribed Pain, Sleep and Anxiety Medications  Special Instructions: If you have smoked or chewed Tobacco  in the last 2 yrs please stop smoking, stop any regular Alcohol  and or any Recreational drug use.  Wear Seat belts while driving.  Please note You were cared for by a hospitalist during your hospital stay. If you have any questions about your discharge medications or the care you received while you were in the hospital after you are discharged, you can call the unit and asked to speak with the hospitalist on call if the hospitalist that took care of you is not available. Once you are discharged, your primary care physician will handle any further medical issues. Please note that NO REFILLS for any discharge medications will be authorized once you are discharged, as it is imperative that you return to your primary care physician (or establish a relationship with a primary care physician if you do not have one) for your aftercare needs so that they can reassess your need for medications and monitor your lab values.  You can reach the hospitalist office at phone [phone] or fax [phone]   If you do not have a primary care physician, you can call [phone] for a physician referral.   Patient is unable to drive, operate heavy machinery,  perform activities at heights or participate in water activities until release by outpatient physician. This was discussed with the patient who expressed understanding.

## 2016-12-11 ENCOUNTER — Encounter (HOSPITAL_COMMUNITY): Payer: Self-pay

## 2016-12-11 ENCOUNTER — Inpatient Hospital Stay (HOSPITAL_COMMUNITY)
Admission: EM | Admit: 2016-12-11 | Discharge: 2016-12-14 | DRG: 101 | Disposition: A | Discharge status: Home / Self Care | Payer: Medicaid Other | Attending: Internal Medicine | Admitting: Internal Medicine

## 2016-12-11 DIAGNOSIS — Z888 Allergy status to other drugs, medicaments and biological substances status: Secondary | ICD-10-CM | POA: Diagnosis not present

## 2016-12-11 DIAGNOSIS — R402142 Coma scale, eyes open, spontaneous, at arrival to emergency department: Secondary | ICD-10-CM | POA: Diagnosis present

## 2016-12-11 DIAGNOSIS — G4089 Other seizures: Secondary | ICD-10-CM | POA: Diagnosis not present

## 2016-12-11 DIAGNOSIS — F10939 Alcohol use, unspecified with withdrawal, unspecified: Secondary | ICD-10-CM | POA: Diagnosis present

## 2016-12-11 DIAGNOSIS — Z79899 Other long term (current) drug therapy: Secondary | ICD-10-CM | POA: Diagnosis not present

## 2016-12-11 DIAGNOSIS — R110 Nausea: Secondary | ICD-10-CM | POA: Diagnosis present

## 2016-12-11 DIAGNOSIS — F10239 Alcohol dependence with withdrawal, unspecified: Secondary | ICD-10-CM | POA: Diagnosis not present

## 2016-12-11 DIAGNOSIS — E876 Hypokalemia: Secondary | ICD-10-CM | POA: Diagnosis not present

## 2016-12-11 DIAGNOSIS — F418 Other specified anxiety disorders: Secondary | ICD-10-CM | POA: Diagnosis present

## 2016-12-11 DIAGNOSIS — R000 Tachycardia, unspecified: Secondary | ICD-10-CM | POA: Diagnosis present

## 2016-12-11 DIAGNOSIS — F17210 Nicotine dependence, cigarettes, uncomplicated: Secondary | ICD-10-CM | POA: Diagnosis present

## 2016-12-11 DIAGNOSIS — Z823 Family history of stroke: Secondary | ICD-10-CM | POA: Diagnosis not present

## 2016-12-11 DIAGNOSIS — Z91040 Latex allergy status: Secondary | ICD-10-CM | POA: Diagnosis not present

## 2016-12-11 DIAGNOSIS — Z886 Allergy status to analgesic agent status: Secondary | ICD-10-CM | POA: Diagnosis not present

## 2016-12-11 DIAGNOSIS — Z803 Family history of malignant neoplasm of breast: Secondary | ICD-10-CM

## 2016-12-11 DIAGNOSIS — F10230 Alcohol dependence with withdrawal, uncomplicated: Secondary | ICD-10-CM | POA: Diagnosis not present

## 2016-12-11 DIAGNOSIS — R402252 Coma scale, best verbal response, oriented, at arrival to emergency department: Secondary | ICD-10-CM | POA: Diagnosis present

## 2016-12-11 DIAGNOSIS — Z801 Family history of malignant neoplasm of trachea, bronchus and lung: Secondary | ICD-10-CM | POA: Diagnosis not present

## 2016-12-11 DIAGNOSIS — F1010 Alcohol abuse, uncomplicated: Secondary | ICD-10-CM | POA: Diagnosis present

## 2016-12-11 DIAGNOSIS — F19230 Other psychoactive substance dependence with withdrawal, uncomplicated: Secondary | ICD-10-CM

## 2016-12-11 DIAGNOSIS — Z22322 Carrier or suspected carrier of Methicillin resistant Staphylococcus aureus: Secondary | ICD-10-CM | POA: Diagnosis not present

## 2016-12-11 DIAGNOSIS — R402362 Coma scale, best motor response, obeys commands, at arrival to emergency department: Secondary | ICD-10-CM | POA: Diagnosis present

## 2016-12-11 DIAGNOSIS — R569 Unspecified convulsions: Secondary | ICD-10-CM

## 2016-12-11 DIAGNOSIS — Z853 Personal history of malignant neoplasm of breast: Secondary | ICD-10-CM

## 2016-12-11 DIAGNOSIS — Z8349 Family history of other endocrine, nutritional and metabolic diseases: Secondary | ICD-10-CM | POA: Diagnosis not present

## 2016-12-11 DIAGNOSIS — F19239 Other psychoactive substance dependence with withdrawal, unspecified: Secondary | ICD-10-CM

## 2016-12-11 DIAGNOSIS — F10930 Alcohol use, unspecified with withdrawal, uncomplicated: Secondary | ICD-10-CM

## 2016-12-11 DIAGNOSIS — K2920 Alcoholic gastritis without bleeding: Secondary | ICD-10-CM | POA: Diagnosis not present

## 2016-12-11 DIAGNOSIS — Q453 Other congenital malformations of pancreas and pancreatic duct: Secondary | ICD-10-CM | POA: Diagnosis not present

## 2016-12-11 DIAGNOSIS — G40409 Other generalized epilepsy and epileptic syndromes, not intractable, without status epilepticus: Secondary | ICD-10-CM | POA: Diagnosis present

## 2016-12-11 DIAGNOSIS — Y906 Blood alcohol level of 120-199 mg/100 ml: Secondary | ICD-10-CM | POA: Diagnosis present

## 2016-12-11 DIAGNOSIS — F19939 Other psychoactive substance use, unspecified with withdrawal, unspecified: Secondary | ICD-10-CM

## 2016-12-11 DIAGNOSIS — Z882 Allergy status to sulfonamides status: Secondary | ICD-10-CM | POA: Diagnosis not present

## 2016-12-11 LAB — COMPREHENSIVE METABOLIC PANEL
ALBUMIN: 3.7 g/dL (ref 3.5–5.0)
ALK PHOS: 89 U/L (ref 38–126)
ALT: 30 U/L (ref 14–54)
AST: 35 U/L (ref 15–41)
Anion gap: 13 (ref 5–15)
BUN: 10 mg/dL (ref 6–20)
CALCIUM: 8.6 mg/dL — AB (ref 8.9–10.3)
CHLORIDE: 100 mmol/L — AB (ref 101–111)
CO2: 24 mmol/L (ref 22–32)
CREATININE: 0.64 mg/dL (ref 0.44–1.00)
GFR calc non Af Amer: >60 mL/min (ref >60)
GLUCOSE: 102 mg/dL — AB (ref 65–99)
Potassium: 3.6 mmol/L (ref 3.5–5.1)
SODIUM: 137 mmol/L (ref 135–145)
Total Bilirubin: 0.6 mg/dL (ref 0.3–1.2)
Total Protein: 7.2 g/dL (ref 6.5–8.1)

## 2016-12-11 LAB — CBC WITH DIFFERENTIAL/PLATELET
BASOS ABS: 0 10*3/uL (ref 0.0–0.1)
Basophils Relative: 0 %
EOS ABS: 0.1 10*3/uL (ref 0.0–0.7)
EOS PCT: 1 %
HCT: 43.7 % (ref 36.0–46.0)
HEMOGLOBIN: 14.4 g/dL (ref 12.0–15.0)
LYMPHS ABS: 2.6 10*3/uL (ref 0.7–4.0)
LYMPHS PCT: 32 %
MCH: 28.3 pg (ref 26.0–34.0)
MCHC: 33 g/dL (ref 30.0–36.0)
MCV: 86 fL (ref 78.0–100.0)
Monocytes Absolute: 0.5 10*3/uL (ref 0.1–1.0)
Monocytes Relative: 6 %
NEUTROS PCT: 61 %
Neutro Abs: 4.8 10*3/uL (ref 1.7–7.7)
PLATELETS: 395 10*3/uL (ref 150–400)
RBC: 5.08 MIL/uL (ref 3.87–5.11)
RDW: 15.5 % (ref 11.5–15.5)
WBC: 8 10*3/uL (ref 4.0–10.5)

## 2016-12-11 LAB — CBG MONITORING, ED: GLUCOSE-CAPILLARY: 111 mg/dL — AB (ref 65–99)

## 2016-12-11 LAB — MRSA PCR SCREENING: MRSA by PCR: POSITIVE — AB

## 2016-12-11 LAB — PREGNANCY, URINE: Preg Test, Ur: NEGATIVE

## 2016-12-11 LAB — ETHANOL: ALCOHOL ETHYL (B): 153 mg/dL — AB (ref <5)

## 2016-12-11 MED ORDER — ADULT MULTIVITAMIN W/MINERALS CH
1.0000 | ORAL_TABLET | Freq: Every day | ORAL | Status: DC
  Administered 2016-12-12 – 2016-12-14 (×3): 1 via ORAL
  Filled 2016-12-11 (×3): qty 1

## 2016-12-11 MED ORDER — LORAZEPAM 2 MG/ML IJ SOLN
0.0000 mg | Freq: Two times a day (BID) | INTRAMUSCULAR | Status: DC
  Administered 2016-12-13 – 2016-12-14 (×2): 2 mg via INTRAVENOUS
  Filled 2016-12-11 (×2): qty 1

## 2016-12-11 MED ORDER — DEXTROSE-NACL 5-0.9 % IV SOLN
INTRAVENOUS | Status: DC
  Administered 2016-12-11: 100 mL/h via INTRAVENOUS
  Administered 2016-12-12 (×2): via INTRAVENOUS

## 2016-12-11 MED ORDER — PANTOPRAZOLE SODIUM 40 MG IV SOLR
40.0000 mg | Freq: Two times a day (BID) | INTRAVENOUS | Status: DC
  Administered 2016-12-11 – 2016-12-12 (×3): 40 mg via INTRAVENOUS
  Filled 2016-12-11 (×3): qty 40

## 2016-12-11 MED ORDER — ENOXAPARIN SODIUM 40 MG/0.4ML ~~LOC~~ SOLN
40.0000 mg | SUBCUTANEOUS | Status: DC
  Administered 2016-12-12: 40 mg via SUBCUTANEOUS
  Filled 2016-12-11 (×3): qty 0.4

## 2016-12-11 MED ORDER — FOLIC ACID 1 MG PO TABS
1.0000 mg | ORAL_TABLET | Freq: Every day | ORAL | Status: DC
  Administered 2016-12-12 – 2016-12-14 (×3): 1 mg via ORAL
  Filled 2016-12-11 (×3): qty 1

## 2016-12-11 MED ORDER — ACETAMINOPHEN 325 MG PO TABS
650.0000 mg | ORAL_TABLET | Freq: Four times a day (QID) | ORAL | Status: DC | PRN
  Filled 2016-12-11 (×2): qty 2

## 2016-12-11 MED ORDER — ONDANSETRON HCL 4 MG PO TABS
4.0000 mg | ORAL_TABLET | Freq: Four times a day (QID) | ORAL | Status: DC | PRN
  Administered 2016-12-13 – 2016-12-14 (×2): 4 mg via ORAL
  Filled 2016-12-11 (×2): qty 1

## 2016-12-11 MED ORDER — LORAZEPAM 1 MG PO TABS
0.0000 mg | ORAL_TABLET | Freq: Two times a day (BID) | ORAL | Status: DC
  Administered 2016-12-13 – 2016-12-14 (×2): 2 mg via ORAL
  Filled 2016-12-11 (×2): qty 2

## 2016-12-11 MED ORDER — LORAZEPAM 2 MG/ML IJ SOLN
0.0000 mg | Freq: Four times a day (QID) | INTRAMUSCULAR | Status: AC
Start: 2016-12-11 — End: 2016-12-13
  Administered 2016-12-11: 2 mg via INTRAVENOUS
  Administered 2016-12-11: 1 mg via INTRAVENOUS
  Administered 2016-12-12 – 2016-12-13 (×2): 2 mg via INTRAVENOUS
  Filled 2016-12-11 (×4): qty 1

## 2016-12-11 MED ORDER — SODIUM CHLORIDE 0.9% FLUSH
3.0000 mL | Freq: Two times a day (BID) | INTRAVENOUS | Status: DC
Start: 2016-12-11 — End: 2016-12-14
  Administered 2016-12-11 – 2016-12-13 (×3): 3 mL via INTRAVENOUS

## 2016-12-11 MED ORDER — VITAMIN B-1 100 MG PO TABS
100.0000 mg | ORAL_TABLET | Freq: Every day | ORAL | Status: DC
  Administered 2016-12-12 – 2016-12-14 (×3): 100 mg via ORAL
  Filled 2016-12-11 (×3): qty 1

## 2016-12-11 MED ORDER — ACETAMINOPHEN 650 MG RE SUPP: 650.0000 mg | Freq: Four times a day (QID) | RECTAL | Status: DC | PRN

## 2016-12-11 MED ORDER — ONDANSETRON HCL 4 MG/2ML IJ SOLN
4.0000 mg | Freq: Four times a day (QID) | INTRAMUSCULAR | Status: DC | PRN
  Administered 2016-12-11 – 2016-12-13 (×2): 4 mg via INTRAVENOUS
  Filled 2016-12-11 (×2): qty 2

## 2016-12-11 MED ORDER — MELATONIN 3 MG PO TABS
1.5000 mg | ORAL_TABLET | Freq: Every evening | ORAL | Status: DC | PRN
  Filled 2016-12-11: qty 0.5

## 2016-12-11 MED ORDER — LORAZEPAM 1 MG PO TABS
0.0000 mg | ORAL_TABLET | Freq: Four times a day (QID) | ORAL | Status: AC
  Administered 2016-12-12: 2 mg via ORAL
  Administered 2016-12-12 – 2016-12-13 (×3): 4 mg via ORAL
  Filled 2016-12-11 (×2): qty 4
  Filled 2016-12-11: qty 2
  Filled 2016-12-11: qty 4
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
## 2016-12-11 NOTE — H&P (Signed)
History and Physical    Kristine Macias FXT:[id number] DOB: August 14, 1986 DOA: 12/11/2016  PCP: Leonides Sake, MD   Patient coming from: Home     Chief Complaint: Seizure   HPI: Kristine Macias is a 31 y.o. female with medical history significant of alcohol abuse and history of a alcohol withdrawal seizures. She has been attempting to stop her alcohol intake, her last drink was about 12 hours ago. She reports drinking about 3-4 beers daily, large size cans. This morning while she was going to the restroom, she suddenly collapsed and developed a tonic-clonic seizure, generalized, witnessed by her husband, who called EMS and patient was brought into the hospital for further evaluation. For last 3 days patient has been attempting to stop drinking alcohol, over the course of this time she has developed nausea, vomiting and  abdominal pain which had been moderate to severe, persistent, no improving or worsening factors, associated with decreased by mouth intake.   ED Course: Patient was diagnosed with alcohol induced seizures, received benzodiazepines and referred for admission, for further evaluation.    Review of Systems:  1. General. No fevers no chills 2. ENT no runny nose or sore throat 3. Pulmonary no shortness of breath cough or hemoptysis 4. Cardiovascular, no angina or claudication  5. Gastrointestinal positive for nausea and vomiting as mentioned in history present illness 6. Hematology no easy bruisability or frequent infections 7. Skeletal no joint pain 8. Neurology. Positive for seizures as mentioned in history present illness. Positive tremors. 9. Dermatology no rashes 10. Urology no dysuria or increased urinary frequency  Past Medical History:  Diagnosis Date  . Alcohol abuse 11/2014   drinking since age 54. chronic, recurrent. at least 2 detox admits before 2015.   Marland Kitchen Alcoholic hepatitis 09/9922   hepatic steatosis on 11/2014 ultrasound.   . Breast cancer [facility])    age 2, lump removed from left breast  . Chlamydia 2007   bacterial vaginosis 09/2011  . Colitis 12/2014   colonoscopy for diarrhea and abnml CT 01/06/15: erythmatous TI (path: active ileitis, ? emerging IBD), rectal erythema (path: mucosal prolapse). Random bx of normal colon (path unremarkable)   . Congenital deafness    left ear only.  Also noted in mother and sibling (unilateral).   . Depression with anxiety initally at age 74  . Failure to thrive in adult 12/2014.    Malnutrition: n/v, not eating, weight loss, BMI 14.  s/p 01/11/2015 PEG (Dr Dorna Leitz).   . Hypertensive urgency   . Irregular heart beat 2010   wt/diet related after evaluation  . Pancreas divisum 02/2015   Type 1 seen on CT.   Marland Kitchen Pneumothorax, spontaneous, tension   . Renal disorder   . Seizure ([place]) 06/2015   due to ETOH/benzo withdrawal.   . Spontaneous pneumothorax 08/2012   right.  chest tube placed.   . Thrombocytopenia ([place]) 04/2007    Past Surgical History:  Procedure Laterality Date  . CESAREAN SECTION  07/2009    x 1.  G3, Para 2012.   . CHEST TUBE INSERTION    . COLONOSCOPY WITH PROPOFOL N/A 01/06/2015   [facility], Dr Rayann Heman. colonoscopy for diarrhea and abnml CT 01/06/15: erythmatous TI (path: active ileitis, ? emerging IBD), rectal erythema (path: mucosal prolapse). Random bx of normal colon (path unremarkable)   . ESOPHAGOGASTRODUODENOSCOPY N/A 01/06/2015   ;[facility], Dr Rayann Heman. For wt loss, N/V: Normal study, duodenal biopsy/pathology: chronic active duodenitis.   Marland Kitchen ESOPHAGOGASTRODUODENOSCOPY N/A 01/11/2015   Rein-normal with  PEG placement  . PEG PLACEMENT N/A 01/11/2015   [facility], Dr Rayann Heman.  for N/V/wt loss/severe malnutrition.      reports that she has been smoking Cigarettes.  She has a 13.00 pack-year smoking history. She has never used smokeless tobacco. She reports that she drinks about 21.6 oz of alcohol per week . She reports that she does not use drugs.  Allergies  Allergen Reactions  . Aspirin Shortness Of Breath    . Effexor [Venlafaxine] Anaphylaxis  . Gabapentin Anaphylaxis  . Sulfa Antibiotics Anaphylaxis  . Tetracyclines & Related Anaphylaxis  . Trazodone And Nefazodone Anaphylaxis  . Latex Hives and Itching  . Paxil [Paroxetine] Hives and Itching  . Seroquel [Quetiapine Fumarate] Hives and Itching  . Zoloft [Sertraline Hcl] Hives and Itching    Family History  Problem Relation Age of Onset  . Thyroid disease Mother   . Cancer Mother     breast cancer  . Cancer Father     lung cancer  . Stroke Other   . Crohn's disease Brother   . Cancer Maternal Grandmother     breast cancer  . Anesthesia problems Neg Hx     Prior to Admission medications   Medication Sig Start Date End Date Taking? Authorizing Provider  clonazePAM (KLONOPIN) 0.5 MG tablet Take 0.5 mg by mouth 2 (two) times daily. Pt does not take when she has been drinking   Yes Historical Provider, MD  folic acid (FOLVITE) 1 MG tablet Take 1 tablet (1 mg total) by mouth daily. 08/06/16  Yes Clanford Marisa Hua, MD  Melatonin 1 MG CAPS Take 1 mg by mouth at bedtime as needed. sleep   Yes Historical Provider, MD  Multiple Vitamin (MULTIVITAMIN WITH MINERALS) TABS tablet Take 1 tablet by mouth daily. 08/07/16  Yes Clanford Marisa Hua, MD  ondansetron (ZOFRAN) 4 MG tablet Take 1 tablet by mouth every 6 (six) hours as needed for nausea/vomiting. 10/25/16  Yes Historical Provider, MD  thiamine 100 MG tablet Take 1 tablet (100 mg total) by mouth daily. 08/06/16  Yes Murlean Iba, MD    Physical Exam: Vitals:   12/11/16 1615 12/11/16 1630 12/11/16 1645 12/11/16 1700  BP: (!) 120/92 (!) 126/93 (!) 127/94 (!) 121/98  Pulse: 79 78 76 81  Resp: 16 17 17 19   SpO2: 96% 96% 96% 95%  Weight:      Height:        Constitutional: deconditioned and ill looking appering Vitals:   12/11/16 1615 12/11/16 1630 12/11/16 1645 12/11/16 1700  BP: (!) 120/92 (!) 126/93 (!) 127/94 (!) 121/98  Pulse: 79 78 76 81  Resp: 16 17 17 19   SpO2: 96% 96%  96% 95%  Weight:      Height:       Eyes: PERRL, lids and conjunctivae with pallor, no icterus.  Head normocephalic, nose and ears no deformities.  ENMT: Mucous membranes are dry. Posterior pharynx clear of any exudate or lesions.Normal dentition.  Neck: normal, supple, no masses, no thyromegaly Respiratory: clear to auscultation bilaterally, no wheezing, no crackles. Normal respiratory effort. No accessory muscle use.  Cardiovascular: Regular rate and rhythm, no murmurs / rubs / gallops. No extremity edema. 2+ pedal pulses. No carotid bruits.  Abdomen: no tenderness, no masses palpated. No hepatosplenomegaly. Bowel sounds positive.  Musculoskeletal: no clubbing / cyanosis. No joint deformity upper and lower extremities. Good ROM, no contractures. Normal muscle tone.  Skin: no rashes, lesions, ulcers. No induration Neurologic: CN 2-12 grossly intact. Sensation  intact, DTR normal. Strength 5/5 in all 4.  Positive distal tremors.       Labs on Admission: I have personally reviewed following labs and imaging studies  CBC:  Recent Labs Lab 12/11/16 1545  WBC 8.0  NEUTROABS 4.8  HGB 14.4  HCT 43.7  MCV 86.0  PLT 673   Basic Metabolic Panel:  Recent Labs Lab 12/11/16 1545  NA 137  K 3.6  CL 100*  CO2 24  GLUCOSE 102*  BUN 10  CREATININE 0.64  CALCIUM 8.6*   GFR: Estimated Creatinine Clearance: 91.7 mL/min (by C-G formula based on SCr of 0.64 mg/dL). Liver Function Tests:  Recent Labs Lab 12/11/16 1545  AST 35  ALT 30  ALKPHOS 89  BILITOT 0.6  PROT 7.2  ALBUMIN 3.7   No results for input(s): LIPASE, AMYLASE in the last 168 hours. No results for input(s): AMMONIA in the last 168 hours. Coagulation Profile: No results for input(s): INR, PROTIME in the last 168 hours. Cardiac Enzymes: No results for input(s): CKTOTAL, CKMB, CKMBINDEX, TROPONINI in the last 168 hours. BNP (last 3 results) No results for input(s): PROBNP in the last 8760 hours. HbA1C: No  results for input(s): HGBA1C in the last 72 hours. CBG:  Recent Labs Lab 12/11/16 1549  GLUCAP 111*   Lipid Profile: No results for input(s): CHOL, HDL, LDLCALC, TRIG, CHOLHDL, LDLDIRECT in the last 72 hours. Thyroid Function Tests: No results for input(s): TSH, T4TOTAL, FREET4, T3FREE, THYROIDAB in the last 72 hours. Anemia Panel: No results for input(s): VITAMINB12, FOLATE, FERRITIN, TIBC, IRON, RETICCTPCT in the last 72 hours. Urine analysis:    Component Value Date/Time   COLORURINE AMBER (A) 11/12/2016 1545   APPEARANCEUR CLOUDY (A) 11/12/2016 1545   APPEARANCEUR Clear 02/15/2016 1456   LABSPEC 1.035 (H) 11/12/2016 1545   LABSPEC 1.006 12/20/2014 1018   PHURINE 5.0 11/12/2016 1545   GLUCOSEU NEGATIVE 11/12/2016 1545   GLUCOSEU Negative 12/20/2014 1018   HGBUR SMALL (A) 11/12/2016 1545   BILIRUBINUR NEGATIVE 11/12/2016 1545   BILIRUBINUR neg 03/13/2016 1700   BILIRUBINUR Negative 02/15/2016 1456   BILIRUBINUR Negative 12/20/2014 1018   KETONESUR NEGATIVE 11/12/2016 1545   PROTEINUR NEGATIVE 11/12/2016 1545   UROBILINOGEN negative 03/13/2016 1700   UROBILINOGEN 0.2 06/13/2012 2306   NITRITE NEGATIVE 11/12/2016 1545   LEUKOCYTESUR NEGATIVE 11/12/2016 1545   LEUKOCYTESUR Negative 02/15/2016 1456   LEUKOCYTESUR Negative 12/20/2014 1018    Radiological Exams on Admission: No results found.  EKG: Independently reviewed. Sinus tachycardia.  Assessment/Plan Active Problems:   Withdrawal seizures ([place])   31 yo female with alcohol abuse presents with seizure activity, generalized event in the setting of etho abuse. Patient had decreased her alcohol intake over the last 3 days, developing withdrawal symptoms.  On the initial physical examination, blood pressure 121/98 with heart rate 106 bpm, respiratory rate 16 to 19,oxygen saturation 95%. Her oral mucosa is dry, her lungs are clear to auscultation bilaterally, heart S1-S2 present tachycardic, abdomen soft nontender,  extremities no edema, patient nonfocal. Positive distal tremors. Sodium 137, potassium 3.6, chloride 100, bicarbonate 24, glucose 102, BUN 10, creatinine 0.64, white count 8.0, hemoglobin 14.4, hematocrit 43.7, platelets 395. Alcohol level 153. Heart rate 107 beats per minute, sinus tachycardia. Pregnancy test negative.  Working diagnosis. Alcohol withdrawal seizures.  1. Alcohol withdrawal seizures. Patient will be admitted to the stepdown unit, she will be placed on benzodiazepines per protocol. Will continue check sensation precautions. Will start patient on clear liquid diet and was advanced  as tolerated. At thiamine, folic acid and multivitamins. Patient will be placed on isotonic IV fluids with dextrose.  2. Alcohol abuse. Patient has been following with outpatient supportive medical team. Continue melatonin at night as needed. Follow-up as an outpatient.  3. Alcohol induced gastritis. Will place patient on a clear liquid diet, isotonic fluids intravenously, IV antiacid therapy with Protonix and IV antiemetics with Zofran.   DVT prophylaxis: enoxaparin  Code Status: Full  Family Communication: No family at the bedside  Disposition Plan: Home   Consults called: Admission status: Inpatient   Shann Lewellyn Gerome Apley MD [facility] Pager [phone]  If 7PM-7AM, please contact night-coverage [email or web address] Password [facility]  12/11/2016, 5:40 PM

## 2016-12-11 NOTE — ED Triage Notes (Addendum)
Pt brought EMS due to having seizure that lasted about 1 minute. Per EMS pt has hx of withdrawal seizures. Pt reports 24+ hours since last drink and n/v/d. Pt received 19m of zofran enroute. Pt a&ox4.

## 2016-12-12 DIAGNOSIS — E876 Hypokalemia: Secondary | ICD-10-CM

## 2016-12-12 DIAGNOSIS — F1010 Alcohol abuse, uncomplicated: Secondary | ICD-10-CM

## 2016-12-12 LAB — BASIC METABOLIC PANEL
ANION GAP: 10 (ref 5–15)
BUN: 14 mg/dL (ref 6–20)
CHLORIDE: 105 mmol/L (ref 101–111)
CO2: 21 mmol/L — AB (ref 22–32)
Calcium: 7.9 mg/dL — ABNORMAL LOW (ref 8.9–10.3)
Creatinine, Ser: 0.79 mg/dL (ref 0.44–1.00)
GFR calc Af Amer: >60 mL/min (ref >60)
GLUCOSE: 98 mg/dL (ref 65–99)
POTASSIUM: 3.3 mmol/L — AB (ref 3.5–5.1)
Sodium: 136 mmol/L (ref 135–145)

## 2016-12-12 LAB — MAGNESIUM: Magnesium: 1.7 mg/dL (ref 1.7–2.4)

## 2016-12-12 LAB — CBC
HEMATOCRIT: 40.6 % (ref 36.0–46.0)
HEMOGLOBIN: 13.1 g/dL (ref 12.0–15.0)
MCH: 28 pg (ref 26.0–34.0)
MCHC: 32.3 g/dL (ref 30.0–36.0)
MCV: 86.8 fL (ref 78.0–100.0)
Platelets: 322 10*3/uL (ref 150–400)
RBC: 4.68 MIL/uL (ref 3.87–5.11)
RDW: 15.7 % — ABNORMAL HIGH (ref 11.5–15.5)
WBC: 12.1 10*3/uL — ABNORMAL HIGH (ref 4.0–10.5)

## 2016-12-12 MED ORDER — MAGNESIUM SULFATE 2 GM/50ML IV SOLN
2.0000 g | Freq: Once | INTRAVENOUS | Status: AC
  Administered 2016-12-12: 2 g via INTRAVENOUS
  Filled 2016-12-12: qty 50

## 2016-12-12 MED ORDER — MUPIROCIN 2 % EX OINT
1.0000 "application " | TOPICAL_OINTMENT | Freq: Two times a day (BID) | CUTANEOUS | Status: DC
  Administered 2016-12-12 – 2016-12-14 (×4): 1 via NASAL
  Filled 2016-12-12: qty 22

## 2016-12-12 MED ORDER — POTASSIUM CHLORIDE 2 MEQ/ML IV SOLN
30.0000 meq | Freq: Once | INTRAVENOUS | Status: AC
  Administered 2016-12-12: 30 meq via INTRAVENOUS
  Filled 2016-12-12: qty 15

## 2016-12-12 MED ORDER — LORAZEPAM 0.5 MG PO TABS: 0.5000 mg | ORAL_TABLET | Freq: Four times a day (QID) | ORAL | Status: DC | PRN

## 2016-12-12 MED ORDER — CHLORHEXIDINE GLUCONATE CLOTH 2 % EX PADS
6.0000 | MEDICATED_PAD | Freq: Every day | CUTANEOUS | Status: DC
  Administered 2016-12-13 – 2016-12-14 (×2): 6 via TOPICAL

## 2016-12-12 NOTE — Significant Event (Signed)
Patient transferred safely to 5M12, taken via wheelchair. Personal belongings (clothes, cell phone, laptop) taken with patient-packed up by patient and carried over to new room by her friend to the bedside of 5M12. Patient settled in bed per her requests. Report given to [employer] Patient stated she will let her family know. Staff in room prior to RN leaving unit.    Jaequan Propes

## 2016-12-12 NOTE — Progress Notes (Signed)
[facility] PROGRESS NOTE  Kristine Macias HQP:[id number] DOB: December 07, 1985 DOA: 12/11/2016  PCP: Leonides Sake, MD  Brief History/Interval Summary: 31 year old Caucasian female with a past medical history significant for alcohol abuse and a previous history of alcohol withdrawal seizures, presented to the hospital with complaint of seizure type activity. She mentioned that she was trying to quit alcohol. And her last drink was about 12 hours prior to hospitalization. She presented with generalized tonic-clonic seizure activity which was witnessed by her husband. She was hospitalized for further management.  Reason for Visit: Alcohol withdrawal  Consultants: None  Procedures: None  Antibiotics: None  Subjective/Interval History: Feels better today. Hasn't had any further seizure activity. Continues to have some nausea but no vomiting.  ROS: Denies any chest pain or shortness of breath.  Objective:  Vital Signs  Vitals:   12/12/16 0900 12/12/16 1000 12/12/16 1100 12/12/16 1200  BP: (!) 121/91 135/86 112/73 128/79  Pulse: 70 73 80 86  Resp: (!) 21 16 18    Temp:      TempSrc:      SpO2: 98% 97% 97%   Weight:      Height:        Intake/Output Summary (Last 24 hours) at 12/12/16 1241 Last data filed at 12/12/16 1200  Gross per 24 hour  Intake          1541.67 ml  Output                0 ml  Net          1541.67 ml   Filed Weights   12/11/16 1450 12/11/16 2000 12/11/16 2022  Weight: 63.5 kg (140 lb) 61.9 kg (136 lb 6.4 oz) 61.9 kg (136 lb 8 oz)    General appearance: alert, cooperative, appears stated age and no distress Resp: clear to auscultation bilaterally Cardio: regular rate and rhythm, S1, S2 normal, no murmur, click, rub or gallop GI: soft, non-tender; bowel sounds normal; no masses,  no organomegaly Extremities: extremities normal, atraumatic, no cyanosis or edema Neurologic: Awake, alert. Oriented 3. No focal neurological deficits. Slightly  tremulous.  Lab Results:  Data Reviewed: I have personally reviewed following labs and imaging studies  CBC:  Recent Labs Lab 12/11/16 1545 12/12/16 0446  WBC 8.0 12.1*  NEUTROABS 4.8  --   HGB 14.4 13.1  HCT 43.7 40.6  MCV 86.0 86.8  PLT 395 599    Basic Metabolic Panel:  Recent Labs Lab 12/11/16 1545 12/12/16 0446 12/12/16 0715  NA 137 136  --   K 3.6 3.3*  --   CL 100* 105  --   CO2 24 21*  --   GLUCOSE 102* 98  --   BUN 10 14  --   CREATININE 0.64 0.79  --   CALCIUM 8.6* 7.9*  --   MG  --   --  1.7    GFR: Estimated Creatinine Clearance: 95.4 mL/min (by C-G formula based on SCr of 0.79 mg/dL).  Liver Function Tests:  Recent Labs Lab 12/11/16 1545  AST 35  ALT 30  ALKPHOS 89  BILITOT 0.6  PROT 7.2  ALBUMIN 3.7    CBG:  Recent Labs Lab 12/11/16 1549  GLUCAP 111*     Recent Results (from the past 240 hour(s))  MRSA PCR Screening     Status: Abnormal   Collection Time: 12/11/16  8:25 PM  Result Value Ref Range Status   MRSA by PCR POSITIVE (A) NEGATIVE Final  Comment:        The GeneXpert MRSA Assay (FDA approved for NASAL specimens only), is one component of a comprehensive MRSA colonization surveillance program. It is not intended to diagnose MRSA infection nor to guide or monitor treatment for MRSA infections. RESULT CALLED TO, READ BACK BY AND VERIFIED WITH: K.CHRAPLIWY,RN AT 2356 BY L.PITT 12/11/16       Radiology Studies: No results found.   Medications:  Scheduled: . Chlorhexidine Gluconate Cloth  6 each Topical Q0600  . enoxaparin (LOVENOX) injection  40 mg Subcutaneous Q24H  . folic acid  1 mg Oral Daily  . LORazepam  0-4 mg Intravenous Q6H   Followed by  . [START ON 12/13/2016] LORazepam  0-4 mg Intravenous Q12H  . LORazepam  0-4 mg Oral Q6H   Followed by  . [START ON 12/13/2016] LORazepam  0-4 mg Oral Q12H  . multivitamin with minerals  1 tablet Oral Daily  . mupirocin ointment  1 application Nasal BID  .  pantoprazole (PROTONIX) IV  40 mg Intravenous Q12H  . sodium chloride flush  3 mL Intravenous Q12H  . thiamine  100 mg Oral Daily   Continuous: . dextrose 5 % and 0.9% NaCl 100 mL/hr at 12/12/16 0820  . potassium chloride (KCL MULTIRUN) 30 mEq in 265 mL IVPB 30 mEq (12/12/16 1206)   VZC:HYIFOYDXAJOIN **OR** acetaminophen, Melatonin, ondansetron **OR** ondansetron (ZOFRAN) IV  Assessment/Plan:  Active Problems:   Withdrawal seizures ([place])    Alcohol withdrawal seizure Has not had any further seizure activity since hospitalization. No need for any antiepileptics at this time. Continue seizure precautions.  History of alcohol abuse with alcohol withdrawal. Continue CIWA protocol. Heart rate, blood pressure seems to be stable. Continue Ativan. Continue thiamine, folic acid. Mobilize as tolerated. LFTs are normal.  Hypokalemia with borderline hypomagnesemia Replace potassium and magnesium.  Alcohol-induced gastritis Continue PPI. Continue antiemetics as needed.  DVT Prophylaxis: Lovenox    Code Status: Full code  Family Communication: No family at bedside  Disposition Plan: Patient's seems to be stable. Okay for transfer to floor.    LOS: 1 day   [facility] Pager [phone] 12/12/2016, 12:41 PM  If 7PM-7AM, please contact night-coverage at [email or web address], password [facility]

## 2016-12-12 NOTE — Progress Notes (Signed)
Pt complaining of headache, nausea, and visual/auditory disturbances such as seeing ants on the floor and ringing in the ears. CIWA at 2325 was 24. 24m ativan given. Reassessment CIWA at 0030 was 162 Pt is calm and trying to rest.  Bed alarm on. Vital signs stable. MD made aware of high CIWA assessment. Will continue to monitor.

## 2016-12-12 NOTE — Care Management Note (Addendum)
Case Management Note  Patient Details  Name: Kristine Macias MRN: [id number] Date of Birth: Sep 17, 1985  Subjective/Objective:   Pt admitted for seizures - current ETOH abuse               Action/Plan:   PTA independent.  Pt states she has a court date tomorrow  and needed a note stating she was admitted.  CM created the form as requested however attending refuses to sign.  Pt transferred to 52M - CM provided new unit signed document to give to pt.   Expected Discharge Date:                  Expected Discharge Plan:     In-House Referral:  Clinical Social Work  Discharge planning Services  CM Consult  Post Acute Care Choice:    Choice offered to:     DME Arranged:    DME Agency:     HH Arranged:    HH Agency:     Status of Service:     If discussed at [employer], dates discussed:    Additional Comments:  Kristine Labrador, RN 12/12/2016, 3:16 PM

## 2016-12-12 NOTE — Progress Notes (Signed)
Patient asking this nurse to call MD and ask for PRN doses of Ativan to supplement scheduled doses according to CIWA score.  States has increase in H/A, nausea, and generalized tremors.

## 2016-12-13 DIAGNOSIS — F10239 Alcohol dependence with withdrawal, unspecified: Secondary | ICD-10-CM

## 2016-12-13 LAB — BASIC METABOLIC PANEL
ANION GAP: 8 (ref 5–15)
BUN: 8 mg/dL (ref 6–20)
CALCIUM: 8.1 mg/dL — AB (ref 8.9–10.3)
CO2: 21 mmol/L — ABNORMAL LOW (ref 22–32)
CREATININE: 0.6 mg/dL (ref 0.44–1.00)
Chloride: 109 mmol/L (ref 101–111)
Glucose, Bld: 99 mg/dL (ref 65–99)
Potassium: 3.8 mmol/L (ref 3.5–5.1)
Sodium: 138 mmol/L (ref 135–145)

## 2016-12-13 LAB — CBC
HEMATOCRIT: 36.9 % (ref 36.0–46.0)
Hemoglobin: 11.6 g/dL — ABNORMAL LOW (ref 12.0–15.0)
MCH: 28 pg (ref 26.0–34.0)
MCHC: 31.4 g/dL (ref 30.0–36.0)
MCV: 89.1 fL (ref 78.0–100.0)
PLATELETS: 292 10*3/uL (ref 150–400)
RBC: 4.14 MIL/uL (ref 3.87–5.11)
RDW: 15.8 % — ABNORMAL HIGH (ref 11.5–15.5)
WBC: 7.7 10*3/uL (ref 4.0–10.5)

## 2016-12-13 LAB — MAGNESIUM: MAGNESIUM: 2 mg/dL (ref 1.7–2.4)

## 2016-12-13 MED ORDER — TRAMADOL HCL 50 MG PO TABS
50.0000 mg | ORAL_TABLET | Freq: Once | ORAL | Status: DC
  Filled 2016-12-13: qty 1

## 2016-12-13 MED ORDER — PANTOPRAZOLE SODIUM 40 MG PO TBEC
40.0000 mg | DELAYED_RELEASE_TABLET | Freq: Every day | ORAL | Status: DC
  Administered 2016-12-13 – 2016-12-14 (×2): 40 mg via ORAL
  Filled 2016-12-13 (×2): qty 1

## 2016-12-13 NOTE — Progress Notes (Signed)
[facility] PROGRESS NOTE  Kristine Macias FRT:[id number] DOB: [birth year] DOA: 12/11/2016  PCP: Leonides Sake, MD  Brief History/Interval Summary: 31 year old Caucasian female with a past medical history significant for alcohol abuse and a previous history of alcohol withdrawal seizures, presented to the hospital with complaint of seizure type activity. She mentioned that she was trying to quit alcohol. And her last drink was about 12 hours prior to hospitalization. She presented with generalized tonic-clonic seizure activity which was witnessed by her husband. She was hospitalized for further management.  Reason for Visit: Alcohol withdrawal  Consultants: None  Procedures: None  Antibiotics: None  Subjective/Interval History: Patient continues to feel better. Denies any further seizure activity. Tolerating her liquid diet. Does have some nausea once but no vomiting. Requesting solid food.   ROS: Denies any chest pain or shortness of breath.  Objective:  Vital Signs  Vitals:   12/12/16 1856 12/12/16 2103 12/13/16 0039 12/13/16 0443  BP: 140/78 122/82 128/85 117/78  Pulse: 88 97 92 77  Resp: 20 20 20 20   Temp: 98.5 F (36.9 C) 98.2 F (36.8 C) 98.6 F (37 C) 98.6 F (37 C)  TempSrc: Oral Oral Oral Oral  SpO2: 98% 99% 100% 98%  Weight:      Height:        Intake/Output Summary (Last 24 hours) at 12/13/16 1012 Last data filed at 12/12/16 2359  Gross per 24 hour  Intake           2356.6 ml  Output                0 ml  Net           2356.6 ml   Filed Weights   12/11/16 1450 12/11/16 2000 12/11/16 2022  Weight: 63.5 kg (140 lb) 61.9 kg (136 lb 6.4 oz) 61.9 kg (136 lb 8 oz)   General appearance: alert, cooperative, appears stated age and no distress Resp: Clear to auscultation bilaterally. No wheezing, rales or rhonchi. Cardio: regular rate and rhythm, S1, S2 normal, no murmur, click, rub or gallop GI: soft, non-tender; bowel sounds normal; no masses,   no organomegaly Neuro: no focal neurological deficits.   Lab Results:  Data Reviewed: I have personally reviewed following labs and imaging studies  CBC:  Recent Labs Lab 12/11/16 1545 12/12/16 0446 12/13/16 0429  WBC 8.0 12.1* 7.7  NEUTROABS 4.8  --   --   HGB 14.4 13.1 11.6*  HCT 43.7 40.6 36.9  MCV 86.0 86.8 89.1  PLT 395 322 701    Basic Metabolic Panel:  Recent Labs Lab 12/11/16 1545 12/12/16 0446 12/12/16 0715 12/13/16 0429  NA 137 136  --  138  K 3.6 3.3*  --  3.8  CL 100* 105  --  109  CO2 24 21*  --  21*  GLUCOSE 102* 98  --  99  BUN 10 14  --  8  CREATININE 0.64 0.79  --  0.60  CALCIUM 8.6* 7.9*  --  8.1*  MG  --   --  1.7 2.0    GFR: Estimated Creatinine Clearance: 95.4 mL/min (by C-G formula based on SCr of 0.6 mg/dL).  Liver Function Tests:  Recent Labs Lab 12/11/16 1545  AST 35  ALT 30  ALKPHOS 89  BILITOT 0.6  PROT 7.2  ALBUMIN 3.7    CBG:  Recent Labs Lab 12/11/16 1549  GLUCAP 111*     Recent Results (from the past 240 hour(s))  MRSA PCR Screening     Status: Abnormal   Collection Time: 12/11/16  8:25 PM  Result Value Ref Range Status   MRSA by PCR POSITIVE (A) NEGATIVE Final    Comment:        The GeneXpert MRSA Assay (FDA approved for NASAL specimens only), is one component of a comprehensive MRSA colonization surveillance program. It is not intended to diagnose MRSA infection nor to guide or monitor treatment for MRSA infections. RESULT CALLED TO, READ BACK BY AND VERIFIED WITH: K.CHRAPLIWY,RN AT 2356 BY L.PITT 12/11/16       Radiology Studies: No results found.   Medications:  Scheduled: . Chlorhexidine Gluconate Cloth  6 each Topical Q0600  . enoxaparin (LOVENOX) injection  40 mg Subcutaneous Q24H  . folic acid  1 mg Oral Daily  . LORazepam  0-4 mg Intravenous Q6H   Followed by  . LORazepam  0-4 mg Intravenous Q12H  . LORazepam  0-4 mg Oral Q6H   Followed by  . LORazepam  0-4 mg Oral Q12H  .  multivitamin with minerals  1 tablet Oral Daily  . mupirocin ointment  1 application Nasal BID  . pantoprazole  40 mg Oral Q1200  . sodium chloride flush  3 mL Intravenous Q12H  . thiamine  100 mg Oral Daily  . traMADol  50 mg Oral Once   Continuous:  HAF:BXUXYBFXOVANV **OR** acetaminophen, LORazepam, Melatonin, ondansetron **OR** ondansetron (ZOFRAN) IV  Assessment/Plan:  Active Problems:   Hypokalemia   Alcohol abuse   Alcohol withdrawal syndrome with complication (HCC)   Withdrawal seizures ([place])    Alcohol withdrawal seizure Has not had any further seizure activity since hospitalization. No need for any antiepileptics at this time. Continue seizure precautions. Patient counseled on her alcohol use. Social worker consulted.  History of alcohol abuse with alcohol withdrawal. Seems to be stable. Denies any further visual hallucinations. Vital signs are all stable. Continue thiamine, folic acid. Mobilize.   Hypokalemia with borderline hypomagnesemia These were replaced.  Alcohol-induced gastritis Change to oral PPI. Advance diet.  DVT Prophylaxis: Lovenox    Code Status: Full code  Family Communication: No family at bedside  Disposition Plan: Patient's seems to be stable. Mobilize. Anticipate discharge tomorrow.    LOS: 2 days   [facility] Pager [phone] 12/13/2016, 10:12 AM  If 7PM-7AM, please contact night-coverage at [email or web address], password [facility]

## 2016-12-14 LAB — BASIC METABOLIC PANEL
Anion gap: 11 (ref 5–15)
BUN: 9 mg/dL (ref 6–20)
CO2: 20 mmol/L — ABNORMAL LOW (ref 22–32)
CREATININE: 0.59 mg/dL (ref 0.44–1.00)
Calcium: 8.7 mg/dL — ABNORMAL LOW (ref 8.9–10.3)
Chloride: 106 mmol/L (ref 101–111)
GFR calc Af Amer: >60 mL/min (ref >60)
GLUCOSE: 116 mg/dL — AB (ref 65–99)
Potassium: 3.5 mmol/L (ref 3.5–5.1)
SODIUM: 137 mmol/L (ref 135–145)

## 2016-12-14 LAB — CBC
HCT: 36.9 % (ref 36.0–46.0)
Hemoglobin: 11.6 g/dL — ABNORMAL LOW (ref 12.0–15.0)
MCH: 28.1 pg (ref 26.0–34.0)
MCHC: 31.4 g/dL (ref 30.0–36.0)
MCV: 89.3 fL (ref 78.0–100.0)
PLATELETS: 226 10*3/uL (ref 150–400)
RBC: 4.13 MIL/uL (ref 3.87–5.11)
RDW: 15.6 % — AB (ref 11.5–15.5)
WBC: 8.7 10*3/uL (ref 4.0–10.5)

## 2016-12-14 MED ORDER — OMEPRAZOLE 20 MG PO CPDR: 20.0000 mg | DELAYED_RELEASE_CAPSULE | Freq: Every day | ORAL | 0 refills | Status: DC

## 2016-12-14 MED ORDER — THIAMINE HCL 100 MG PO TABS: 100.0000 mg | ORAL_TABLET | Freq: Every day | ORAL | 0 refills | Status: DC

## 2016-12-14 MED ORDER — LORAZEPAM 0.5 MG PO TABS: ORAL_TABLET | ORAL | 0 refills | Status: DC

## 2016-12-14 MED ORDER — ONDANSETRON 4 MG PO TBDP: 4.0000 mg | ORAL_TABLET | Freq: Three times a day (TID) | ORAL | 0 refills | Status: DC | PRN

## 2016-12-14 NOTE — Care Management Note (Signed)
Case Management Note  Patient Details  Name: Yunique Dearcos MRN: [id number] Date of Birth: 10/19/1985  Subjective/Objective:                    Action/Plan: Pt discharged home with self care. Pt with insurance, PCP and transportation home. No further needs per CM.  Expected Discharge Date:  12/14/16               Expected Discharge Plan:  Home/Self Care  In-House Referral:  Clinical Social Work  Discharge planning Services  CM Consult  Post Acute Care Choice:    Choice offered to:     DME Arranged:    DME Agency:     HH Arranged:    HH Agency:     Status of Service:  Completed, signed off  If discussed at [employer], dates discussed:    Additional Comments:  Pollie Friar, RN 12/14/2016, 3:44 PM

## 2016-12-14 NOTE — Progress Notes (Signed)
Patient requesting new letter for court. Social work notified. Patient states she has mild nausea. Dr. [place] Pink notified. Prescription for zofran ordered. Patient notified and educated

## 2016-12-14 NOTE — Progress Notes (Signed)
RN discussed discharge instructions with patient. Patient vocalized understanding of medications, understands ativan rx, states she will f/u with pcp in 1 week, no driving while taking ativan, must be cleared by pcp. Given prescriptions. Given discharge instructions. Neuro assessment unchanged.

## 2016-12-14 NOTE — Discharge Summary (Signed)
[facility]  Physician Discharge Summary   Patient ID: Kristine Macias MRN: [id number] DOB/AGE: [birth year] 31 y.o.  Admit date: 12/11/2016 Discharge date: 12/14/2016  PCP: Leonides Sake, MD  DISCHARGE DIAGNOSES:  Active Problems:   Hypokalemia   Alcohol abuse   Alcohol withdrawal syndrome with complication (HCC)   Withdrawal seizures (HCC)   RECOMMENDATIONS FOR OUTPATIENT FOLLOW UP: 1. Patient advised to refrain from further alcohol use.   DISCHARGE CONDITION: fair  Diet recommendation: Regular  Filed Weights   12/11/16 1450 12/11/16 2000 12/11/16 2022  Weight: 63.5 kg (140 lb) 61.9 kg (136 lb 6.4 oz) 61.9 kg (136 lb 8 oz)    INITIAL HISTORY: 31 year old Caucasian female with a past medical history significant for alcohol abuse and a previous history of alcohol withdrawal seizures, presented to the hospital with complaint of seizure type activity. She mentioned that she was trying to quit alcohol. And her last drink was about 12 hours prior to hospitalization. She presented with generalized tonic-clonic seizure activity which was witnessed by her husband. She was hospitalized for further management.  HOSPITAL COURSE:   Alcohol withdrawal seizure Has not had any further seizure activity since hospitalization. No need for any antiepileptics at this time. Patient counseled on her alcohol use. Outpatient resources provided by Education officer, [facility].   History of alcohol abuse with alcohol withdrawal. Withdrawal symptoms have improved. Denies any further visual hallucinations. Vital signs are all stable. CIWA score is about 5. She'll be prescribed few Ativan to take at home. She has been also stopped taking her Klonopin. She has been ambulating without any difficulties. No significant tremors noted on examination. Has been tolerating her diet.  Hypokalemia with borderline hypomagnesemia These were replaced.  Alcohol-induced gastritis Continue PPI.  Overall,  stable. Okay for discharge home today.    PERTINENT LABS:  The results of significant diagnostics from this hospitalization (including imaging, microbiology, ancillary and laboratory) are listed below for reference.    Microbiology: Recent Results (from the past 240 hour(s))  MRSA PCR Screening     Status: Abnormal   Collection Time: 12/11/16  8:25 PM  Result Value Ref Range Status   MRSA by PCR POSITIVE (A) NEGATIVE Final    Comment:        The GeneXpert MRSA Assay (FDA approved for NASAL specimens only), is one component of a comprehensive MRSA colonization surveillance program. It is not intended to diagnose MRSA infection nor to guide or monitor treatment for MRSA infections. RESULT CALLED TO, READ BACK BY AND VERIFIED WITH: K.CHRAPLIWY,RN AT 2356 BY L.PITT 12/11/16      Labs: Basic Metabolic Panel:  Recent Labs Lab 12/11/16 1545 12/12/16 0446 12/12/16 0715 12/13/16 0429 12/14/16 0643  NA 137 136  --  138 137  K 3.6 3.3*  --  3.8 3.5  CL 100* 105  --  109 106  CO2 24 21*  --  21* 20*  GLUCOSE 102* 98  --  99 116*  BUN 10 14  --  8 9  CREATININE 0.64 0.79  --  0.60 0.59  CALCIUM 8.6* 7.9*  --  8.1* 8.7*  MG  --   --  1.7 2.0  --    Liver Function Tests:  Recent Labs Lab 12/11/16 1545  AST 35  ALT 30  ALKPHOS 89  BILITOT 0.6  PROT 7.2  ALBUMIN 3.7   CBC:  Recent Labs Lab 12/11/16 1545 12/12/16 0446 12/13/16 0429 12/14/16 0643  WBC 8.0 12.1* 7.7 8.7  NEUTROABS 4.8  --   --   --  HGB 14.4 13.1 11.6* 11.6*  HCT 43.7 40.6 36.9 36.9  MCV 86.0 86.8 89.1 89.3  PLT 395 322 292 226    CBG:  Recent Labs Lab 12/11/16 1549  GLUCAP 111*     DISCHARGE EXAMINATION: Vitals:   12/13/16 2232 12/14/16 0143 12/14/16 0454 12/14/16 1011  BP: 118/65 129/81 (!) 117/59 119/80  Pulse: 82 83 69 67  Resp: 18 16 19 18   Temp: 98.6 F (37 C) 98.5 F (36.9 C) 98.6 F (37 C) 98.1 F (36.7 C)  TempSrc: Oral Oral Oral Oral  SpO2: 100% 98% 98% 98%    Weight:      Height:       General appearance: alert, cooperative, appears stated age and no distress Resp: clear to auscultation bilaterally Cardio: regular rate and rhythm, S1, S2 normal, no murmur, click, rub or gallop GI: soft, non-tender; bowel sounds normal; no masses,  no organomegaly  DISPOSITION: Home  Discharge Instructions    Call MD for:  difficulty breathing, headache or visual disturbances    Complete by:  As directed    Call MD for:  extreme fatigue    Complete by:  As directed    Call MD for:  persistant dizziness or light-headedness    Complete by:  As directed    Call MD for:  persistant nausea and vomiting    Complete by:  As directed    Call MD for:  severe uncontrolled pain    Complete by:  As directed    Call MD for:  temperature >100.4    Complete by:  As directed    Diet general    Complete by:  As directed    Discharge instructions    Complete by:  As directed    Please refrain from alcohol use. Please take your medications as instructed.  You were cared for by a hospitalist during your hospital stay. If you have any questions about your discharge medications or the care you received while you were in the hospital after you are discharged, you can call the unit and asked to speak with the hospitalist on call if the hospitalist that took care of you is not available. Once you are discharged, your primary care physician will handle any further medical issues. Please note that NO REFILLS for any discharge medications will be authorized once you are discharged, as it is imperative that you return to your primary care physician (or establish a relationship with a primary care physician if you do not have one) for your aftercare needs so that they can reassess your need for medications and monitor your lab values. If you do not have a primary care physician, you can call [phone] for a physician referral.   Increase activity slowly    Complete by:  As directed        ALLERGIES:  Allergies  Allergen Reactions  . Aspirin Shortness Of Breath  . Effexor [Venlafaxine] Anaphylaxis  . Gabapentin Anaphylaxis  . Sulfa Antibiotics Anaphylaxis  . Tetracyclines & Related Anaphylaxis  . Trazodone And Nefazodone Anaphylaxis  . Latex Hives and Itching  . Paxil [Paroxetine] Hives and Itching  . Seroquel [Quetiapine Fumarate] Hives and Itching  . Zoloft [Sertraline Hcl] Hives and Itching    Allergies as of 12/14/2016      Reactions   Aspirin Shortness Of Breath   Effexor [venlafaxine] Anaphylaxis   Gabapentin Anaphylaxis   Sulfa Antibiotics Anaphylaxis   Tetracyclines & Related Anaphylaxis   Trazodone And Nefazodone Anaphylaxis  Latex Hives, Itching   Paxil [paroxetine] Hives, Itching   Seroquel [quetiapine Fumarate] Hives, Itching   Zoloft [sertraline Hcl] Hives, Itching      Medication List    STOP taking these medications   clonazePAM 0.5 MG tablet Commonly known as:  KLONOPIN   ondansetron 4 MG tablet Commonly known as:  ZOFRAN     TAKE these medications   folic acid 1 MG tablet Commonly known as:  FOLVITE Take 1 tablet (1 mg total) by mouth daily.   LORazepam 0.5 MG tablet Commonly known as:  ATIVAN Take 1-2 tablets every 6 hours as needed for anxiety/tremors for first 2 days and then 1 tablet every 6 hours as needed for anxiety/tremors after that.   Melatonin 1 MG Caps Take 1 mg by mouth at bedtime as needed. sleep   multivitamin with minerals Tabs tablet Take 1 tablet by mouth daily.   omeprazole 20 MG capsule Commonly known as:  PRILOSEC Take 1 capsule (20 mg total) by mouth daily.   ondansetron 4 MG disintegrating tablet Commonly known as:  ZOFRAN ODT Take 1 tablet (4 mg total) by mouth every 8 (eight) hours as needed for nausea or vomiting.   thiamine 100 MG tablet Take 1 tablet (100 mg total) by mouth daily.        Follow-up Information    HAMRICK,MAURA L, MD. Schedule an appointment as soon as possible for a  visit in 1 week(s).   Specialty:  Family Medicine Contact information: [place] [address] [phone]           TOTAL DISCHARGE TIME: 27 mins  [facility] Pager [phone]  12/14/2016, 2:52 PM

## 2016-12-14 NOTE — Clinical Social Work Note (Addendum)
Clinical Social Work Assessment  Patient Details  Name: Kristine Macias MRN: [id number] Date of Birth: 04-Sep-1985  Date of referral:  12/14/16               Reason for consult:  Substance Use/ETOH Abuse                Permission sought to share information with:  Family Supports Permission granted to share information::  No  Name::        Agency::     Relationship::     Contact Information:     Housing/Transportation Living arrangements for the past 2 months:  Single Family Home Source of Information:  Patient Patient Interpreter Needed:  None Criminal Activity/Legal Involvement Pertinent to Current Situation/Hospitalization:  No - Comment as needed Significant Relationships:  Dependent Children, Parents, Significant Other, Mental Health Provider Lives with:  Minor Children, Parents, Significant Other Do you feel safe going back to the place where you live?  Yes Need for family participation in patient care:  No (Coment)  Care giving concerns:  No caregiving concerns identified.    Social Worker assessment / plan:  CSW consulted for "Has a court date. Needs letter," and "current substance abuse." CSW met with pt at bedside. Room was dark and boyfriend sleeping in chair. Pt reported she has been in an inpatient setting and has attended alcoholics anonymous previously. Pt feels those settings are just "not for her." Pt states she has an outpatient therapist which she sees regularly and is helping her to find a support group that would be a better fit for her. Pt states she is from home with her mother, significant other, and 3 children. Pt feels her family is supportive of her goal to maintain sobriety. CSW provided and explained substance use cessation resources. Pt was aware and understood.  RNCM provided pt with court letter, but at the time pt was not ready for d/c. CSW provided updated pt letter for court to reflect d/c date. CSW signing off as no further SW needs identified.  Please reconsult if new SW needs arise.   Employment status:  Other Network engineer) Insurance information:  Medicaid In [place]) PT Recommendations:  Not assessed at this time Information / Referral to community resources:  Support Groups, Outpatient Substance Abuse Treatment Options, Residential Substance Abuse Treatment Options  Patient/Family's Response to care:  Pt engaged and cooperative during assessment, though she was awaken out of her sleep by CSW.   Patient/Family's Understanding of and Emotional Response to Diagnosis, Current Treatment, and Prognosis:  Pt understands diagnosis, prognosis, and treatment. Pt feels she is making progress towards sobriety.   Emotional Assessment Appearance:  Appears stated age Attitude/Demeanor/Rapport:  Other (Appropriate) Affect (typically observed):    Orientation:  Oriented to Self, Oriented to Place, Oriented to  Time, Oriented to Situation Alcohol / Substance use:  Alcohol Use Psych involvement (Current and /or in the community):  No (Comment)  Discharge Needs  Concerns to be addressed:  Substance Abuse Concerns Readmission within the last 30 days:  Yes Current discharge risk:  Substance Abuse Barriers to Discharge:  Barriers Resolved   Truitt Merle, LCSW 12/14/2016, 12:13 PM

## 2016-12-14 NOTE — Discharge Instructions (Signed)

## 2016-12-23 ENCOUNTER — Inpatient Hospital Stay (HOSPITAL_COMMUNITY)
Admission: EM | Admit: 2016-12-23 | Discharge: 2016-12-25 | DRG: 897 | Disposition: A | Discharge status: Home / Self Care | Payer: Medicaid Other | Attending: Internal Medicine | Admitting: Internal Medicine

## 2016-12-23 ENCOUNTER — Encounter (HOSPITAL_COMMUNITY): Payer: Self-pay

## 2016-12-23 DIAGNOSIS — K7010 Alcoholic hepatitis without ascites: Secondary | ICD-10-CM | POA: Diagnosis present

## 2016-12-23 DIAGNOSIS — Z79899 Other long term (current) drug therapy: Secondary | ICD-10-CM | POA: Diagnosis not present

## 2016-12-23 DIAGNOSIS — K760 Fatty (change of) liver, not elsewhere classified: Secondary | ICD-10-CM | POA: Diagnosis present

## 2016-12-23 DIAGNOSIS — F17210 Nicotine dependence, cigarettes, uncomplicated: Secondary | ICD-10-CM | POA: Diagnosis present

## 2016-12-23 DIAGNOSIS — F419 Anxiety disorder, unspecified: Secondary | ICD-10-CM | POA: Diagnosis present

## 2016-12-23 DIAGNOSIS — Z853 Personal history of malignant neoplasm of breast: Secondary | ICD-10-CM

## 2016-12-23 DIAGNOSIS — Y904 Blood alcohol level of 80-99 mg/100 ml: Secondary | ICD-10-CM | POA: Diagnosis present

## 2016-12-23 DIAGNOSIS — Z888 Allergy status to other drugs, medicaments and biological substances status: Secondary | ICD-10-CM

## 2016-12-23 DIAGNOSIS — Z881 Allergy status to other antibiotic agents status: Secondary | ICD-10-CM

## 2016-12-23 DIAGNOSIS — Z882 Allergy status to sulfonamides status: Secondary | ICD-10-CM | POA: Diagnosis not present

## 2016-12-23 DIAGNOSIS — R569 Unspecified convulsions: Secondary | ICD-10-CM | POA: Diagnosis present

## 2016-12-23 DIAGNOSIS — R112 Nausea with vomiting, unspecified: Secondary | ICD-10-CM | POA: Diagnosis present

## 2016-12-23 DIAGNOSIS — I10 Essential (primary) hypertension: Secondary | ICD-10-CM | POA: Diagnosis present

## 2016-12-23 DIAGNOSIS — F10239 Alcohol dependence with withdrawal, unspecified: Principal | ICD-10-CM | POA: Diagnosis present

## 2016-12-23 DIAGNOSIS — H9042 Sensorineural hearing loss, unilateral, left ear, with unrestricted hearing on the contralateral side: Secondary | ICD-10-CM | POA: Diagnosis present

## 2016-12-23 DIAGNOSIS — Z91040 Latex allergy status: Secondary | ICD-10-CM

## 2016-12-23 DIAGNOSIS — F10230 Alcohol dependence with withdrawal, uncomplicated: Secondary | ICD-10-CM

## 2016-12-23 DIAGNOSIS — R197 Diarrhea, unspecified: Secondary | ICD-10-CM

## 2016-12-23 DIAGNOSIS — Z765 Malingerer [conscious simulation]: Secondary | ICD-10-CM | POA: Diagnosis not present

## 2016-12-23 DIAGNOSIS — F10939 Alcohol use, unspecified with withdrawal, unspecified: Secondary | ICD-10-CM | POA: Diagnosis present

## 2016-12-23 DIAGNOSIS — R1110 Vomiting, unspecified: Secondary | ICD-10-CM

## 2016-12-23 DIAGNOSIS — F411 Generalized anxiety disorder: Secondary | ICD-10-CM | POA: Diagnosis present

## 2016-12-23 LAB — CBC
HEMATOCRIT: 41.4 % (ref 36.0–46.0)
HEMOGLOBIN: 14 g/dL (ref 12.0–15.0)
MCH: 29.7 pg (ref 26.0–34.0)
MCHC: 33.8 g/dL (ref 30.0–36.0)
MCV: 87.7 fL (ref 78.0–100.0)
Platelets: 363 10*3/uL (ref 150–400)
RBC: 4.72 MIL/uL (ref 3.87–5.11)
RDW: 15.5 % (ref 11.5–15.5)
WBC: 7.2 10*3/uL (ref 4.0–10.5)

## 2016-12-23 LAB — URINALYSIS, ROUTINE W REFLEX MICROSCOPIC
Bilirubin Urine: NEGATIVE
GLUCOSE, UA: NEGATIVE mg/dL
KETONES UR: NEGATIVE mg/dL
LEUKOCYTES UA: NEGATIVE
Nitrite: NEGATIVE
PH: 7 (ref 5.0–8.0)
Protein, ur: NEGATIVE mg/dL
Specific Gravity, Urine: 1.013 (ref 1.005–1.030)

## 2016-12-23 LAB — COMPREHENSIVE METABOLIC PANEL
ALK PHOS: 83 U/L (ref 38–126)
ALT: 66 U/L — AB (ref 14–54)
ANION GAP: 10 (ref 5–15)
AST: 100 U/L — ABNORMAL HIGH (ref 15–41)
Albumin: 3.7 g/dL (ref 3.5–5.0)
BILIRUBIN TOTAL: 0.8 mg/dL (ref 0.3–1.2)
BUN: 16 mg/dL (ref 6–20)
CALCIUM: 8.1 mg/dL — AB (ref 8.9–10.3)
CO2: 26 mmol/L (ref 22–32)
CREATININE: 0.72 mg/dL (ref 0.44–1.00)
Chloride: 100 mmol/L — ABNORMAL LOW (ref 101–111)
GFR calc Af Amer: >60 mL/min (ref >60)
GFR calc non Af Amer: >60 mL/min (ref >60)
GLUCOSE: 98 mg/dL (ref 65–99)
Potassium: 3.8 mmol/L (ref 3.5–5.1)
SODIUM: 136 mmol/L (ref 135–145)
TOTAL PROTEIN: 7.6 g/dL (ref 6.5–8.1)

## 2016-12-23 LAB — I-STAT BETA HCG BLOOD, ED (MC, WL, AP ONLY): I-stat hCG, quantitative: <5 m[IU]/mL (ref <5)

## 2016-12-23 LAB — CBG MONITORING, ED: GLUCOSE-CAPILLARY: 109 mg/dL — AB (ref 65–99)

## 2016-12-23 LAB — ETHANOL: ALCOHOL ETHYL (B): 84 mg/dL — AB (ref <5)

## 2016-12-23 LAB — MAGNESIUM: MAGNESIUM: 2.3 mg/dL (ref 1.7–2.4)

## 2016-12-23 MED ORDER — PROMETHAZINE HCL 25 MG/ML IJ SOLN
12.5000 mg | Freq: Four times a day (QID) | INTRAMUSCULAR | Status: DC | PRN
  Administered 2016-12-25: 25 mg via INTRAVENOUS
  Filled 2016-12-23: qty 1

## 2016-12-23 MED ORDER — SODIUM CHLORIDE 0.9 % IV BOLUS (SEPSIS)
1000.0000 mL | Freq: Once | INTRAVENOUS | Status: AC
  Administered 2016-12-23: 1000 mL via INTRAVENOUS

## 2016-12-23 MED ORDER — ACETAMINOPHEN 650 MG RE SUPP: 650.0000 mg | Freq: Four times a day (QID) | RECTAL | Status: DC | PRN

## 2016-12-23 MED ORDER — ONDANSETRON HCL 4 MG/2ML IJ SOLN
4.0000 mg | Freq: Once | INTRAMUSCULAR | Status: AC
  Administered 2016-12-23: 4 mg via INTRAVENOUS
  Filled 2016-12-23: qty 2

## 2016-12-23 MED ORDER — PROMETHAZINE HCL 25 MG PO TABS
25.0000 mg | ORAL_TABLET | Freq: Once | ORAL | Status: AC
  Administered 2016-12-23: 25 mg via ORAL
  Filled 2016-12-23: qty 1

## 2016-12-23 MED ORDER — ONDANSETRON HCL 4 MG PO TABS: 4.0000 mg | ORAL_TABLET | Freq: Four times a day (QID) | ORAL | Status: DC | PRN

## 2016-12-23 MED ORDER — MAGNESIUM SULFATE IN D5W 1-5 GM/100ML-% IV SOLN
1.0000 g | Freq: Once | INTRAVENOUS | Status: AC
  Administered 2016-12-24: 1 g via INTRAVENOUS
  Filled 2016-12-23: qty 100

## 2016-12-23 MED ORDER — ENOXAPARIN SODIUM 40 MG/0.4ML ~~LOC~~ SOLN
40.0000 mg | Freq: Every day | SUBCUTANEOUS | Status: DC
  Administered 2016-12-24: 40 mg via SUBCUTANEOUS
  Filled 2016-12-23 (×2): qty 0.4

## 2016-12-23 MED ORDER — ONDANSETRON HCL 4 MG/2ML IJ SOLN
4.0000 mg | Freq: Four times a day (QID) | INTRAMUSCULAR | Status: DC | PRN
  Administered 2016-12-24: 4 mg via INTRAVENOUS
  Filled 2016-12-23: qty 2

## 2016-12-23 MED ORDER — FAMOTIDINE IN NACL 20-0.9 MG/50ML-% IV SOLN
20.0000 mg | Freq: Two times a day (BID) | INTRAVENOUS | Status: DC
  Administered 2016-12-24 (×3): 20 mg via INTRAVENOUS
  Filled 2016-12-23 (×4): qty 50

## 2016-12-23 MED ORDER — VITAMIN B-1 100 MG PO TABS: 100.0000 mg | ORAL_TABLET | Freq: Every day | ORAL | Status: DC

## 2016-12-23 MED ORDER — ACETAMINOPHEN 325 MG PO TABS: 650.0000 mg | ORAL_TABLET | Freq: Four times a day (QID) | ORAL | Status: DC | PRN

## 2016-12-23 MED ORDER — LORAZEPAM 1 MG PO TABS
1.0000 mg | ORAL_TABLET | Freq: Once | ORAL | Status: AC
  Administered 2016-12-23: 1 mg via ORAL
  Filled 2016-12-23: qty 1

## 2016-12-23 MED ORDER — IBUPROFEN 200 MG PO TABS: 600.0000 mg | ORAL_TABLET | Freq: Four times a day (QID) | ORAL | Status: DC | PRN

## 2016-12-23 MED ORDER — FOLIC ACID 1 MG PO TABS
1.0000 mg | ORAL_TABLET | Freq: Every day | ORAL | Status: DC
  Administered 2016-12-24 – 2016-12-25 (×2): 1 mg via ORAL
  Filled 2016-12-23 (×2): qty 1

## 2016-12-23 MED ORDER — LORAZEPAM 2 MG/ML IJ SOLN
2.0000 mg | INTRAMUSCULAR | Status: DC | PRN
  Administered 2016-12-24 (×2): 2 mg via INTRAVENOUS
  Filled 2016-12-23 (×2): qty 1

## 2016-12-23 MED ORDER — THIAMINE HCL 100 MG/ML IJ SOLN
Freq: Once | INTRAVENOUS | Status: AC
  Administered 2016-12-24: 01:00:00 via INTRAVENOUS
  Filled 2016-12-23: qty 1000

## 2016-12-23 MED ORDER — SODIUM CHLORIDE 0.9% FLUSH
3.0000 mL | Freq: Two times a day (BID) | INTRAVENOUS | Status: DC
  Administered 2016-12-24: 3 mL via INTRAVENOUS

## 2016-12-23 MED ORDER — LORAZEPAM 2 MG/ML IJ SOLN: 2.0000 mg | Freq: Once | INTRAMUSCULAR | Status: DC

## 2016-12-23 MED ORDER — ADULT MULTIVITAMIN W/MINERALS CH
1.0000 | ORAL_TABLET | Freq: Every day | ORAL | Status: DC
  Administered 2016-12-24 – 2016-12-25 (×2): 1 via ORAL
  Filled 2016-12-23 (×3): qty 1

## 2016-12-23 NOTE — ED Triage Notes (Signed)
Pt from home via EMS with complaints of seizure today. Pt takes klonopin for her seizures. Pt drinks 4 "large" beers daily. Pt has also had n,v,d x 3 days. Pt has a small child with a GI bug at home. Pt has no oral trauma. Pt was given 4 mg zofran IV in route.
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
## 2016-12-23 NOTE — ED Notes (Signed)
Bed: WA21 Expected date:  Expected time:  Means of arrival:  Comments: Seizure
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
## 2016-12-23 NOTE — ED Notes (Signed)
Pt aware a urine sample is needed.

## 2016-12-23 NOTE — ED Notes (Addendum)
Spoke w provider reported ciwa of 9/ provider ordered meds

## 2016-12-24 DIAGNOSIS — F411 Generalized anxiety disorder: Secondary | ICD-10-CM

## 2016-12-24 LAB — COMPREHENSIVE METABOLIC PANEL
ALK PHOS: 78 U/L (ref 38–126)
ALT: 58 U/L — AB (ref 14–54)
AST: 105 U/L — ABNORMAL HIGH (ref 15–41)
Albumin: 3.4 g/dL — ABNORMAL LOW (ref 3.5–5.0)
Anion gap: 9 (ref 5–15)
BUN: 16 mg/dL (ref 6–20)
CALCIUM: 7.5 mg/dL — AB (ref 8.9–10.3)
CO2: 24 mmol/L (ref 22–32)
CREATININE: 0.81 mg/dL (ref 0.44–1.00)
Chloride: 107 mmol/L (ref 101–111)
GFR calc non Af Amer: >60 mL/min (ref >60)
Glucose, Bld: 83 mg/dL (ref 65–99)
Potassium: 3.8 mmol/L (ref 3.5–5.1)
SODIUM: 140 mmol/L (ref 135–145)
Total Bilirubin: 0.5 mg/dL (ref 0.3–1.2)
Total Protein: 6.2 g/dL — ABNORMAL LOW (ref 6.5–8.1)

## 2016-12-24 LAB — CBC
HCT: 38.1 % (ref 36.0–46.0)
Hemoglobin: 12.5 g/dL (ref 12.0–15.0)
MCH: 29 pg (ref 26.0–34.0)
MCHC: 32.8 g/dL (ref 30.0–36.0)
MCV: 88.4 fL (ref 78.0–100.0)
PLATELETS: 292 10*3/uL (ref 150–400)
RBC: 4.31 MIL/uL (ref 3.87–5.11)
RDW: 15.5 % (ref 11.5–15.5)
WBC: 7.3 10*3/uL (ref 4.0–10.5)

## 2016-12-24 LAB — GLUCOSE, CAPILLARY: GLUCOSE-CAPILLARY: 78 mg/dL (ref 65–99)

## 2016-12-24 MED ORDER — VITAMIN B-1 100 MG PO TABS: 100.0000 mg | ORAL_TABLET | Freq: Every day | ORAL | Status: DC

## 2016-12-24 MED ORDER — CHLORDIAZEPOXIDE HCL 25 MG PO CAPS: 25.0000 mg | ORAL_CAPSULE | Freq: Every day | ORAL | Status: DC

## 2016-12-24 MED ORDER — CHLORDIAZEPOXIDE HCL 25 MG PO CAPS
25.0000 mg | ORAL_CAPSULE | Freq: Three times a day (TID) | ORAL | Status: DC
  Administered 2016-12-24 – 2016-12-25 (×2): 25 mg via ORAL
  Filled 2016-12-24 (×2): qty 1

## 2016-12-24 MED ORDER — CHLORDIAZEPOXIDE HCL 25 MG PO CAPS: 25.0000 mg | ORAL_CAPSULE | ORAL | Status: DC

## 2016-12-24 MED ORDER — CHLORDIAZEPOXIDE HCL 25 MG PO CAPS
25.0000 mg | ORAL_CAPSULE | Freq: Four times a day (QID) | ORAL | Status: AC
  Administered 2016-12-24 (×3): 25 mg via ORAL
  Filled 2016-12-24 (×3): qty 1

## 2016-12-24 MED ORDER — HYDROXYZINE HCL 25 MG PO TABS
25.0000 mg | ORAL_TABLET | Freq: Four times a day (QID) | ORAL | Status: DC | PRN
  Filled 2016-12-24: qty 1

## 2016-12-24 MED ORDER — LOPERAMIDE HCL 2 MG PO CAPS: 2.0000 mg | ORAL_CAPSULE | ORAL | Status: DC | PRN

## 2016-12-24 MED ORDER — ADULT MULTIVITAMIN W/MINERALS CH: 1.0000 | ORAL_TABLET | Freq: Every day | ORAL | Status: DC

## 2016-12-24 MED ORDER — THIAMINE HCL 100 MG/ML IJ SOLN
100.0000 mg | Freq: Once | INTRAMUSCULAR | Status: DC
  Filled 2016-12-24: qty 2

## 2016-12-24 MED ORDER — VITAMIN B-1 100 MG PO TABS
100.0000 mg | ORAL_TABLET | Freq: Every day | ORAL | Status: DC
  Administered 2016-12-24 – 2016-12-25 (×2): 100 mg via ORAL
  Filled 2016-12-24 (×2): qty 1

## 2016-12-24 MED ORDER — ONDANSETRON 4 MG PO TBDP: 4.0000 mg | ORAL_TABLET | Freq: Four times a day (QID) | ORAL | Status: DC | PRN

## 2016-12-24 MED ORDER — CHLORDIAZEPOXIDE HCL 25 MG PO CAPS: 25.0000 mg | ORAL_CAPSULE | Freq: Four times a day (QID) | ORAL | Status: DC | PRN

## 2016-12-24 NOTE — Progress Notes (Signed)
[facility] PROGRESS NOTE    Progress Note  Kristine Macias  KNL:[id number] DOB: 03-Apr-1986 DOA: 12/23/2016 PCP: Leonides Sake, MD     Brief Narrative:   Kristine Macias is an 31 y.o. female past medical history significant for severe alcohol dependence, alcohol hepatitis and alcoholic seizures represents a emergency room with nausea vomiting tremors and generalized seizures.  Assessment/Plan:   Seizure due to alcohol withdrawal [facility]): Her CIWA protocol continues to be high will continue Ativan. No seizures overnight. We'll switch her to improve a car. She hadwitnessed seizures here in the ED. She has a neurologist she follows up as an outpatient at [facility] she received in May.  Alcohol withdrawal syndrome with complication (HCC) Continue thiamine and folate  Generalized anxiety disorder: At home she is on Klonopin, Librium should cover her generalized anxiety.  Nausea & vomiting None now resolved.    DVT prophylaxis: lovenox Family Communication:none Disposition Plan/Barrier to D/C: transfer to med-surg Code Status:     Code Status Orders        Start     Ordered   12/23/16 2325  Full code  Continuous     12/23/16 2327    Code Status History    Date Active Date Inactive Code Status Order ID Comments User Context   12/11/2016  8:34 PM 12/14/2016  5:19 PM Full Code [id number]  Tawni Millers, MD Inpatient   11/11/2016  7:03 PM 11/19/2016  4:21 PM Full Code [id number]  Vianne Bulls, MD ED   10/28/2016  9:42 PM 10/30/2016  7:12 PM Full Code [id number]  Phillips Grout, MD Inpatient   09/21/2016  5:05 AM 09/25/2016  8:55 PM Full Code [id number]  Rise Patience, MD ED   09/20/2016  2:13 PM 09/20/2016  7:59 PM Full Code [id number]  Nena Polio, MD ED   08/30/2016  6:48 AM 09/04/2016 10:59 PM Full Code [id number]  Toy Baker, MD Inpatient   08/04/2016 12:11 AM 08/06/2016  5:33 PM Full Code [id number]  Norval Morton, MD Inpatient   08/03/2016  10:51 PM 08/04/2016 12:11 AM Full Code [id number]  Antonietta Breach, PA-C ED   01/25/2016  3:39 PM 02/02/2016  8:16 PM Full Code [id number]  Eugenie Filler, MD Inpatient   12/30/2015 11:22 PM 01/02/2016  4:06 PM Full Code [id number]  Theressa Millard, MD Inpatient   12/30/2015  7:23 PM 12/30/2015 11:22 PM Full Code [id number]  Clayton Bibles, PA-C ED   12/21/2015  1:21 AM 12/23/2015  6:09 PM Full Code [id number]  Sylvan Cheese, MD ED   12/20/2015  7:40 PM 12/21/2015  1:21 AM Full Code [id number]  Orbie Pyo, MD ED   12/16/2015  1:42 AM 12/16/2015  5:10 PM Full Code [id number]  Merryl Hacker, MD ED   12/08/2015 12:39 AM 12/10/2015  5:18 PM Full Code [id number]  Fritzi Mandes, MD Inpatient   10/14/2015  8:31 PM 10/22/2015  8:29 PM Full Code [id number]  Vaughan Basta, MD Inpatient   10/05/2015  3:34 AM 10/05/2015  5:53 PM Full Code [id number]  Lance Coon, MD ED   08/08/2015  5:52 PM 08/10/2015  8:21 PM Full Code [id number]  Elmarie Shiley, MD Inpatient   07/09/2015  6:11 PM 07/14/2015  5:56 PM Full Code [id number]  Theodis Blaze, MD Inpatient   07/01/2015 12:32 AM 07/02/2015  5:52 PM Full Code [id number]  Lytle Butte, MD ED   06/21/2015 12:25 PM 06/22/2015  4:08 PM Full Code  [id number]  Epifanio Lesches, MD ED   05/20/2015  7:56 PM 05/23/2015  3:55 PM Full Code [id number]  Fritzi Mandes, MD Inpatient   04/16/2015  9:21 PM 04/18/2015  4:55 PM Full Code [id number]  Idelle Crouch, MD Inpatient   03/01/2015  8:52 PM 03/03/2015  4:05 PM Full Code [id number]  Lytle Butte, MD ED   01/04/2015  7:23 PM 01/13/2015  7:12 PM Full Code [id number]  Fritzi Mandes, MD ED   11/24/2014  6:21 PM 11/26/2014  1:29 PM Full Code [id number]  Serita Grit, MD ED        IV Access:    Peripheral IV   Procedures and diagnostic studies:   No results found.   Medical Consultants:    None.  Anti-Infectives:   None  Subjective:    Duane Boston she relates she still little bit nauseated but no vomiting.  Objective:     Vitals:   12/24/16 0500 12/24/16 0600 12/24/16 0700 12/24/16 0726  BP: 118/82 (!) 129/95 126/83   Pulse: 67 73 67   Resp: 17 19 16    Temp:    97.7 F (36.5 C)  TempSrc:    Oral  SpO2: 95% 95% 94%   Weight:      Height:        Intake/Output Summary (Last 24 hours) at 12/24/16 0736 Last data filed at 12/24/16 0700  Gross per 24 hour  Intake              870 ml  Output                0 ml  Net              870 ml   Filed Weights   12/24/16 0000  Weight: 62.8 kg (138 lb 7.2 oz)    Exam: General exam: Plain comfortably in bed in no acute distress Respiratory system: Good air movement and clear to auscultation Cardiovascular system: Regular rate and rhythm with positive S1-S2 no murmurs or gallops Gastrointestinal system: Positive bowel sounds soft nontender nondistended Central nervous system: She is awake alert and oriented 3 nonfocal Extremities: No edema Skin: No rashes or ulcerations Psychiatry: Insight and judgment appear to be normal.   Data Reviewed:    Labs: Basic Metabolic Panel:  Recent Labs Lab 12/23/16 1855 12/24/16 0333  NA 136 140  K 3.8 3.8  CL 100* 107  CO2 26 24  GLUCOSE 98 83  BUN 16 16  CREATININE 0.72 0.81  CALCIUM 8.1* 7.5*  MG 2.3  --    GFR Estimated Creatinine Clearance: 94.2 mL/min (by C-G formula based on SCr of 0.81 mg/dL). Liver Function Tests:  Recent Labs Lab 12/23/16 1855 12/24/16 0333  AST 100* 105*  ALT 66* 58*  ALKPHOS 83 78  BILITOT 0.8 0.5  PROT 7.6 6.2*  ALBUMIN 3.7 3.4*   No results for input(s): LIPASE, AMYLASE in the last 168 hours. No results for input(s): AMMONIA in the last 168 hours. Coagulation profile No results for input(s): INR, PROTIME in the last 168 hours.  CBC:  Recent Labs Lab 12/23/16 1855 12/24/16 0333  WBC 7.2 7.3  HGB 14.0 12.5  HCT 41.4 38.1  MCV 87.7 88.4  PLT 363 292   Cardiac Enzymes: No results for input(s): CKTOTAL, CKMB, CKMBINDEX, TROPONINI in the last 168  hours. BNP (last 3 results) No results for input(s): PROBNP in the last 8760 hours. CBG:  Recent Labs Lab 12/23/16 1719 12/24/16  0730  GLUCAP 109* 78   D-Dimer: No results for input(s): DDIMER in the last 72 hours. Hgb A1c: No results for input(s): HGBA1C in the last 72 hours. Lipid Profile: No results for input(s): CHOL, HDL, LDLCALC, TRIG, CHOLHDL, LDLDIRECT in the last 72 hours. Thyroid function studies: No results for input(s): TSH, T4TOTAL, T3FREE, THYROIDAB in the last 72 hours.  Invalid input(s): FREET3 Anemia work up: No results for input(s): VITAMINB12, FOLATE, FERRITIN, TIBC, IRON, RETICCTPCT in the last 72 hours. Sepsis Labs:  Recent Labs Lab 12/23/16 1855 12/24/16 0333  WBC 7.2 7.3   Microbiology No results found for this or any previous visit (from the past 240 hour(s)).   Medications:   . enoxaparin (LOVENOX) injection  40 mg Subcutaneous QHS  . folic acid  1 mg Oral Daily  . LORazepam  2 mg Intravenous Once  . multivitamin with minerals  1 tablet Oral Daily  . sodium chloride flush  3 mL Intravenous Q12H  . thiamine  100 mg Oral Daily   Continuous Infusions: . famotidine (PEPCID) IV Stopped (12/24/16 0100)    Time spent: 25 min   LOS: 1 day   Charlynne Cousins  [facility] Pager [phone]  *Please refer to [email or web address], password [email or web address] to get updated schedule on who will round on this patient, as hospitalists switch teams weekly. If 7PM-7AM, please contact night-coverage at [email or web address], password [email or web address] for any overnight needs.  12/24/2016, 7:36 AM

## 2016-12-24 NOTE — Progress Notes (Signed)
Patient transferred to the floor by nursing staff. Pt orientated to the room and safety policies. assessment completed with no change from previous documented assessment.

## 2016-12-25 DIAGNOSIS — Z765 Malingerer [conscious simulation]: Secondary | ICD-10-CM

## 2016-12-25 LAB — GLUCOSE, CAPILLARY: Glucose-Capillary: 94 mg/dL (ref 65–99)

## 2016-12-25 MED ORDER — CHLORDIAZEPOXIDE HCL 25 MG PO CAPS: ORAL_CAPSULE | ORAL | 0 refills | Status: DC

## 2016-12-25 NOTE — Progress Notes (Signed)
A complete assessment done on 4/20 by CSW for substance use outpatient follow up. CSW met with patient at beside, explain role. Patient recognized CSW from a previous visit. Patient states she continues to see her outpatient counselor at [facility]. Patient reports at times the group can be difficult because she does not have some to relate to in the group that is learning to overcome alcohol addiction. Patient states AA groups are not very beneficial because of a bad experience. CSW attempted to motivate patient to try another group in a different location. Patient not interested.CSW educated patient about residential treatment options. Patient states she is still currently on waiting list for residential treatment where her children are able to visit. Patient states the program will be three months. Patient discussed support from her spouse and motivation of her children. CSW listened actively and provided emotional support.  Patient plan is to return home and continue her outpatient treatment.   Kathrin Greathouse, Latanya Presser, MSW Clinical Social Worker 5E and Psychiatric Service Line [phone] 12/25/2016  11:39 AM

## 2016-12-25 NOTE — Discharge Summary (Signed)
Physician Discharge Summary  Kristine Macias SHF:[id number] DOB: [birth year] DOA: 12/23/2016  PCP: Leonides Sake, MD  Admit date: 12/23/2016 Discharge date: 12/25/2016  Admitted From: home Disposition:  home  Recommendations for Outpatient Follow-up:  1. Follow up with PCP in 1-2 weeks, she needs to be referred to [facility].  Home Health:no Equipment/Devices:none  Discharge Condition:stable CODE STATUS:full Diet recommendation: Heart Healthy   Brief/Interim Summary: 31 year old female with past medical history of severe alcohol dependence, alcohol hepatitis considering the emergency room for nausea vomiting tremors unwitnessed seizures.   Discharge Diagnoses:  Principal Problem:   Alcohol withdrawal syndrome with complication (HCC) Active Problems:   Generalized anxiety disorder   Nausea & vomiting   Seizure due to alcohol withdrawal (HCC)   Alcohol dependence with withdrawal with complication ([place])  Probable seizures due to alcohol withdrawal: She stopped drinking abruptly she was started on the Ativan protocol, she was started on thiamine and folate with no events overnight. She was switched to Levaquin which she will continue as an outpatient. She has a neurologist over at [facility] we she will see in May.  All withdrawal syndromes without complications: Continue thiamine and folate.  General anxiety disorder: No changes were made.  Nausea and vomiting: Now resolved, possibly due to alcohol withdrawals  Discharge Instructions  Discharge Instructions    Diet - low sodium heart healthy    Complete by:  As directed    Increase activity slowly    Complete by:  As directed      Allergies as of 12/25/2016      Reactions   Aspirin Shortness Of Breath   Effexor [venlafaxine] Anaphylaxis   Gabapentin Anaphylaxis   Sulfa Antibiotics Anaphylaxis   Tetracyclines & Related Anaphylaxis   Trazodone And Nefazodone Anaphylaxis   Latex Hives, Itching   Paxil [paroxetine] Hives,  Itching   Seroquel [quetiapine Fumarate] Hives, Itching   Zoloft [sertraline Hcl] Hives, Itching      Medication List    TAKE these medications   chlordiazePOXIDE 25 MG capsule Commonly known as:  LIBRIUM Take 2 tablets on 12/25/2016,, take 2 tablets morning and Wednesday evening and 12/26/2016, take 1 tablet on 12/27/2016.   clonazePAM 0.5 MG tablet Commonly known as:  KLONOPIN Take 0.5 mg by mouth 2 (two) times daily.   folic acid 1 MG tablet Commonly known as:  FOLVITE Take 1 tablet (1 mg total) by mouth daily.   Melatonin 1 MG Caps Take 1 mg by mouth at bedtime as needed. sleep   ondansetron 4 MG tablet Commonly known as:  ZOFRAN Take 4 mg by mouth every 6 (six) hours as needed for nausea.   thiamine 100 MG tablet Take 1 tablet (100 mg total) by mouth daily.       Allergies  Allergen Reactions  . Aspirin Shortness Of Breath  . Effexor [Venlafaxine] Anaphylaxis  . Gabapentin Anaphylaxis  . Sulfa Antibiotics Anaphylaxis  . Tetracyclines & Related Anaphylaxis  . Trazodone And Nefazodone Anaphylaxis  . Latex Hives and Itching  . Paxil [Paroxetine] Hives and Itching  . Seroquel [Quetiapine Fumarate] Hives and Itching  . Zoloft [Sertraline Hcl] Hives and Itching    Consultations:  None   Procedures/Studies:  No results found.    Subjective:  No complaints Discharge Exam: Vitals:   12/24/16 2032 12/25/16 0525  BP: (!) 128/91 118/72  Pulse: 85 79  Resp: 18 18  Temp: 98.2 F (36.8 C) 98 F (36.7 C)   Vitals:   12/24/16 0912 12/24/16 1455 12/24/16  2032 12/25/16 0525  BP: 122/86 138/87 (!) 128/91 118/72  Pulse: 75 85 85 79  Resp: 20 18 18 18   Temp: 97.9 F (36.6 C) 98.1 F (36.7 C) 98.2 F (36.8 C) 98 F (36.7 C)  TempSrc: Oral Oral Oral Oral  SpO2: 97% 100% 98% 99%  Weight:      Height:        General: sleepy this morning but no acute distress Cardiovascular: regular rate and rhythm with positive S1-S2 Respiratory: good  movement  clear to auscultation. Abdominal: positive bowel sounds, soft, nontender, nondistended Extremities: no edema    The results of significant diagnostics from this hospitalization (including imaging, microbiology, ancillary and laboratory) are listed below for reference.     Microbiology: No results found for this or any previous visit (from the past 240 hour(s)).   Labs: BNP (last 3 results) No results for input(s): BNP in the last 8760 hours. Basic Metabolic Panel:  Recent Labs Lab 12/23/16 1855 12/24/16 0333  NA 136 140  K 3.8 3.8  CL 100* 107  CO2 26 24  GLUCOSE 98 83  BUN 16 16  CREATININE 0.72 0.81  CALCIUM 8.1* 7.5*  MG 2.3  --    Liver Function Tests:  Recent Labs Lab 12/23/16 1855 12/24/16 0333  AST 100* 105*  ALT 66* 58*  ALKPHOS 83 78  BILITOT 0.8 0.5  PROT 7.6 6.2*  ALBUMIN 3.7 3.4*   No results for input(s): LIPASE, AMYLASE in the last 168 hours. No results for input(s): AMMONIA in the last 168 hours. CBC:  Recent Labs Lab 12/23/16 1855 12/24/16 0333  WBC 7.2 7.3  HGB 14.0 12.5  HCT 41.4 38.1  MCV 87.7 88.4  PLT 363 292   Cardiac Enzymes: No results for input(s): CKTOTAL, CKMB, CKMBINDEX, TROPONINI in the last 168 hours. BNP: Invalid input(s): POCBNP CBG:  Recent Labs Lab 12/23/16 1719 12/24/16 0730 12/25/16 0729  GLUCAP 109* 78 94   D-Dimer No results for input(s): DDIMER in the last 72 hours. Hgb A1c No results for input(s): HGBA1C in the last 72 hours. Lipid Profile No results for input(s): CHOL, HDL, LDLCALC, TRIG, CHOLHDL, LDLDIRECT in the last 72 hours. Thyroid function studies No results for input(s): TSH, T4TOTAL, T3FREE, THYROIDAB in the last 72 hours.  Invalid input(s): FREET3 Anemia work up No results for input(s): VITAMINB12, FOLATE, FERRITIN, TIBC, IRON, RETICCTPCT in the last 72 hours. Urinalysis    Component Value Date/Time   COLORURINE YELLOW 12/23/2016 2028   APPEARANCEUR HAZY (A) 12/23/2016 2028    APPEARANCEUR Clear 02/15/2016 1456   LABSPEC 1.013 12/23/2016 2028   LABSPEC 1.006 12/20/2014 1018   PHURINE 7.0 12/23/2016 2028   GLUCOSEU NEGATIVE 12/23/2016 2028   GLUCOSEU Negative 12/20/2014 1018   HGBUR SMALL (A) 12/23/2016 2028   BILIRUBINUR NEGATIVE 12/23/2016 2028   BILIRUBINUR neg 03/13/2016 1700   BILIRUBINUR Negative 02/15/2016 1456   BILIRUBINUR Negative 12/20/2014 [place] 12/23/2016 2028   PROTEINUR NEGATIVE 12/23/2016 2028   UROBILINOGEN negative 03/13/2016 1700   UROBILINOGEN 0.2 06/13/2012 2306   NITRITE NEGATIVE 12/23/2016 2028   LEUKOCYTESUR NEGATIVE 12/23/2016 2028   LEUKOCYTESUR Negative 02/15/2016 1456   LEUKOCYTESUR Negative 12/20/2014 1018   Sepsis Labs Invalid input(s): PROCALCITONIN,  WBC,  LACTICIDVEN Microbiology No results found for this or any previous visit (from the past 240 hour(s)).   Time coordinating discharge: Over 30 minutes  SIGNED:   Charlynne Cousins, MD  [facility] 12/25/2016, 7:53 AM Pager  If 7PM-7AM, please contact night-coverage [email or web address] Password [email or web address]

## 2016-12-26 LAB — URINE CULTURE

## 2017-01-14 ENCOUNTER — Inpatient Hospital Stay (HOSPITAL_COMMUNITY)
Admission: EM | Admit: 2017-01-14 | Discharge: 2017-01-18 | DRG: 894 | Discharge status: Against Medical Advice | Payer: Medicaid Other | Attending: Internal Medicine | Admitting: Internal Medicine

## 2017-01-14 ENCOUNTER — Encounter (HOSPITAL_COMMUNITY): Payer: Self-pay

## 2017-01-14 DIAGNOSIS — Z5321 Procedure and treatment not carried out due to patient leaving prior to being seen by health care provider: Secondary | ICD-10-CM

## 2017-01-14 DIAGNOSIS — F10939 Alcohol use, unspecified with withdrawal, unspecified: Secondary | ICD-10-CM | POA: Diagnosis present

## 2017-01-14 DIAGNOSIS — Z888 Allergy status to other drugs, medicaments and biological substances status: Secondary | ICD-10-CM

## 2017-01-14 DIAGNOSIS — Z79899 Other long term (current) drug therapy: Secondary | ICD-10-CM

## 2017-01-14 DIAGNOSIS — Z882 Allergy status to sulfonamides status: Secondary | ICD-10-CM

## 2017-01-14 DIAGNOSIS — H9042 Sensorineural hearing loss, unilateral, left ear, with unrestricted hearing on the contralateral side: Secondary | ICD-10-CM | POA: Diagnosis present

## 2017-01-14 DIAGNOSIS — R569 Unspecified convulsions: Secondary | ICD-10-CM | POA: Diagnosis present

## 2017-01-14 DIAGNOSIS — Z886 Allergy status to analgesic agent status: Secondary | ICD-10-CM

## 2017-01-14 DIAGNOSIS — F329 Major depressive disorder, single episode, unspecified: Secondary | ICD-10-CM | POA: Diagnosis present

## 2017-01-14 DIAGNOSIS — F19939 Other psychoactive substance use, unspecified with withdrawal, unspecified: Secondary | ICD-10-CM

## 2017-01-14 DIAGNOSIS — F411 Generalized anxiety disorder: Secondary | ICD-10-CM | POA: Diagnosis present

## 2017-01-14 DIAGNOSIS — Z91040 Latex allergy status: Secondary | ICD-10-CM

## 2017-01-14 DIAGNOSIS — F419 Anxiety disorder, unspecified: Secondary | ICD-10-CM | POA: Diagnosis present

## 2017-01-14 DIAGNOSIS — F19239 Other psychoactive substance dependence with withdrawal, unspecified: Secondary | ICD-10-CM

## 2017-01-14 DIAGNOSIS — F10239 Alcohol dependence with withdrawal, unspecified: Principal | ICD-10-CM | POA: Diagnosis present

## 2017-01-14 DIAGNOSIS — F17210 Nicotine dependence, cigarettes, uncomplicated: Secondary | ICD-10-CM | POA: Diagnosis present

## 2017-01-14 DIAGNOSIS — Z881 Allergy status to other antibiotic agents status: Secondary | ICD-10-CM

## 2017-01-14 DIAGNOSIS — F1010 Alcohol abuse, uncomplicated: Secondary | ICD-10-CM

## 2017-01-14 LAB — COMPREHENSIVE METABOLIC PANEL
ALT: 37 U/L (ref 14–54)
AST: 51 U/L — ABNORMAL HIGH (ref 15–41)
Albumin: 4 g/dL (ref 3.5–5.0)
Alkaline Phosphatase: 96 U/L (ref 38–126)
Anion gap: 15 (ref 5–15)
BUN: 15 mg/dL (ref 6–20)
CO2: 24 mmol/L (ref 22–32)
Calcium: 8.4 mg/dL — ABNORMAL LOW (ref 8.9–10.3)
Chloride: 100 mmol/L — ABNORMAL LOW (ref 101–111)
Creatinine, Ser: 0.63 mg/dL (ref 0.44–1.00)
GFR calc Af Amer: >60 mL/min (ref >60)
GFR calc non Af Amer: >60 mL/min (ref >60)
Glucose, Bld: 83 mg/dL (ref 65–99)
Potassium: 3.9 mmol/L (ref 3.5–5.1)
Sodium: 139 mmol/L (ref 135–145)
Total Bilirubin: 0.4 mg/dL (ref 0.3–1.2)
Total Protein: 8.1 g/dL (ref 6.5–8.1)

## 2017-01-14 LAB — CBC WITH DIFFERENTIAL/PLATELET
Basophils Absolute: 0.1 10*3/uL (ref 0.0–0.1)
Basophils Relative: 1 %
Eosinophils Absolute: 0.1 10*3/uL (ref 0.0–0.7)
Eosinophils Relative: 1 %
HCT: 46 % (ref 36.0–46.0)
Hemoglobin: 15.3 g/dL — ABNORMAL HIGH (ref 12.0–15.0)
Lymphocytes Relative: 37 %
Lymphs Abs: 3.8 10*3/uL (ref 0.7–4.0)
MCH: 28.9 pg (ref 26.0–34.0)
MCHC: 33.3 g/dL (ref 30.0–36.0)
MCV: 86.8 fL (ref 78.0–100.0)
Monocytes Absolute: 0.6 10*3/uL (ref 0.1–1.0)
Monocytes Relative: 6 %
Neutro Abs: 5.7 10*3/uL (ref 1.7–7.7)
Neutrophils Relative %: 55 %
Platelets: 414 10*3/uL — ABNORMAL HIGH (ref 150–400)
RBC: 5.3 MIL/uL — ABNORMAL HIGH (ref 3.87–5.11)
RDW: 15.3 % (ref 11.5–15.5)
WBC: 10.2 10*3/uL (ref 4.0–10.5)

## 2017-01-14 LAB — URINALYSIS, ROUTINE W REFLEX MICROSCOPIC
Bilirubin Urine: NEGATIVE
GLUCOSE, UA: NEGATIVE mg/dL
Ketones, ur: NEGATIVE mg/dL
NITRITE: NEGATIVE
PROTEIN: 30 mg/dL — AB
SPECIFIC GRAVITY, URINE: 1.011 (ref 1.005–1.030)
pH: 6 (ref 5.0–8.0)

## 2017-01-14 LAB — RAPID URINE DRUG SCREEN, HOSP PERFORMED
AMPHETAMINES: NOT DETECTED
BENZODIAZEPINES: POSITIVE — AB
Barbiturates: NOT DETECTED
COCAINE: NOT DETECTED
Opiates: NOT DETECTED
Tetrahydrocannabinol: NOT DETECTED

## 2017-01-14 LAB — ETHANOL: Alcohol, Ethyl (B): 248 mg/dL — ABNORMAL HIGH (ref <5)

## 2017-01-14 LAB — I-STAT BETA HCG BLOOD, ED (MC, WL, AP ONLY): I-stat hCG, quantitative: <5 m[IU]/mL (ref <5)

## 2017-01-14 LAB — LIPASE, BLOOD: Lipase: 18 U/L (ref 11–51)

## 2017-01-14 MED ORDER — SODIUM CHLORIDE 0.9 % IV BOLUS (SEPSIS)
1000.0000 mL | Freq: Once | INTRAVENOUS | Status: AC
  Administered 2017-01-14: 1000 mL via INTRAVENOUS

## 2017-01-14 MED ORDER — LORAZEPAM 2 MG/ML IJ SOLN: 1.0000 mg | Freq: Four times a day (QID) | INTRAMUSCULAR | Status: DC | PRN

## 2017-01-14 MED ORDER — LORAZEPAM 1 MG PO TABS
1.0000 mg | ORAL_TABLET | Freq: Four times a day (QID) | ORAL | Status: DC | PRN
  Administered 2017-01-14: 1 mg via ORAL
  Filled 2017-01-14: qty 1

## 2017-01-14 MED ORDER — THIAMINE HCL 100 MG/ML IJ SOLN
100.0000 mg | Freq: Every day | INTRAMUSCULAR | Status: DC
  Filled 2017-01-14: qty 2

## 2017-01-14 MED ORDER — ADULT MULTIVITAMIN W/MINERALS CH
1.0000 | ORAL_TABLET | Freq: Every day | ORAL | Status: DC
  Administered 2017-01-15: 1 via ORAL
  Filled 2017-01-14: qty 1

## 2017-01-14 MED ORDER — FOLIC ACID 1 MG PO TABS
1.0000 mg | ORAL_TABLET | Freq: Every day | ORAL | Status: DC
  Administered 2017-01-15 – 2017-01-17 (×4): 1 mg via ORAL
  Filled 2017-01-14 (×5): qty 1

## 2017-01-14 MED ORDER — VITAMIN B-1 100 MG PO TABS
100.0000 mg | ORAL_TABLET | Freq: Every day | ORAL | Status: DC
  Administered 2017-01-15: 100 mg via ORAL
  Filled 2017-01-14: qty 1

## 2017-01-14 NOTE — ED Notes (Signed)
Bed: WA09 Expected date:  Expected time:  Means of arrival:  Comments: Seizure, etoh withdrawal
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
## 2017-01-14 NOTE — ED Notes (Signed)
Pt reports that she has had 2 sz today and has been detoxing from [place] use. Pt reports her last drink was last night and has been having N/V reporting 15 times of emesis, unable to tolerate fluid./ Pt denies diarrhea. Pt states that she has mild chest pain that worsens with deep breaths to right side of chest to that she reportst 8/10 pain.

## 2017-01-14 NOTE — ED Notes (Signed)
Pt is supposed to be taking benzos for her etoh dependency, but she  Hasn't taken any in two days, pt states she had two seizures prior to EMS arrival, no postictal state or incontinence, pt states that doesn't happen to her when she has seizures

## 2017-01-15 ENCOUNTER — Encounter (HOSPITAL_COMMUNITY): Payer: Self-pay | Admitting: Internal Medicine

## 2017-01-15 ENCOUNTER — Observation Stay (HOSPITAL_COMMUNITY): Payer: Medicaid Other

## 2017-01-15 DIAGNOSIS — F1010 Alcohol abuse, uncomplicated: Secondary | ICD-10-CM

## 2017-01-15 DIAGNOSIS — Z79899 Other long term (current) drug therapy: Secondary | ICD-10-CM | POA: Diagnosis not present

## 2017-01-15 DIAGNOSIS — Z882 Allergy status to sulfonamides status: Secondary | ICD-10-CM | POA: Diagnosis not present

## 2017-01-15 DIAGNOSIS — R569 Unspecified convulsions: Secondary | ICD-10-CM

## 2017-01-15 DIAGNOSIS — F10239 Alcohol dependence with withdrawal, unspecified: Secondary | ICD-10-CM | POA: Diagnosis not present

## 2017-01-15 DIAGNOSIS — F419 Anxiety disorder, unspecified: Secondary | ICD-10-CM | POA: Diagnosis present

## 2017-01-15 DIAGNOSIS — Z881 Allergy status to other antibiotic agents status: Secondary | ICD-10-CM | POA: Diagnosis not present

## 2017-01-15 DIAGNOSIS — H9042 Sensorineural hearing loss, unilateral, left ear, with unrestricted hearing on the contralateral side: Secondary | ICD-10-CM | POA: Diagnosis present

## 2017-01-15 DIAGNOSIS — F19230 Other psychoactive substance dependence with withdrawal, uncomplicated: Secondary | ICD-10-CM

## 2017-01-15 DIAGNOSIS — Z886 Allergy status to analgesic agent status: Secondary | ICD-10-CM | POA: Diagnosis not present

## 2017-01-15 DIAGNOSIS — F10939 Alcohol use, unspecified with withdrawal, unspecified: Secondary | ICD-10-CM | POA: Diagnosis present

## 2017-01-15 DIAGNOSIS — Z91040 Latex allergy status: Secondary | ICD-10-CM | POA: Diagnosis not present

## 2017-01-15 DIAGNOSIS — F329 Major depressive disorder, single episode, unspecified: Secondary | ICD-10-CM | POA: Diagnosis present

## 2017-01-15 DIAGNOSIS — F411 Generalized anxiety disorder: Secondary | ICD-10-CM | POA: Diagnosis present

## 2017-01-15 DIAGNOSIS — Z888 Allergy status to other drugs, medicaments and biological substances status: Secondary | ICD-10-CM | POA: Diagnosis not present

## 2017-01-15 DIAGNOSIS — Z765 Malingerer [conscious simulation]: Secondary | ICD-10-CM | POA: Diagnosis not present

## 2017-01-15 DIAGNOSIS — F17210 Nicotine dependence, cigarettes, uncomplicated: Secondary | ICD-10-CM | POA: Diagnosis present

## 2017-01-15 DIAGNOSIS — Z5321 Procedure and treatment not carried out due to patient leaving prior to being seen by health care provider: Secondary | ICD-10-CM | POA: Diagnosis not present

## 2017-01-15 LAB — CBC
HEMATOCRIT: 41.5 % (ref 36.0–46.0)
HEMOGLOBIN: 13.3 g/dL (ref 12.0–15.0)
MCH: 28.1 pg (ref 26.0–34.0)
MCHC: 32 g/dL (ref 30.0–36.0)
MCV: 87.6 fL (ref 78.0–100.0)
Platelets: 336 10*3/uL (ref 150–400)
RBC: 4.74 MIL/uL (ref 3.87–5.11)
RDW: 15.4 % (ref 11.5–15.5)
WBC: 9.6 10*3/uL (ref 4.0–10.5)

## 2017-01-15 LAB — BASIC METABOLIC PANEL
Anion gap: 13 (ref 5–15)
BUN: 16 mg/dL (ref 6–20)
CO2: 21 mmol/L — AB (ref 22–32)
CREATININE: 0.63 mg/dL (ref 0.44–1.00)
Calcium: 8.4 mg/dL — ABNORMAL LOW (ref 8.9–10.3)
Chloride: 104 mmol/L (ref 101–111)
Glucose, Bld: 65 mg/dL (ref 65–99)
Potassium: 4.1 mmol/L (ref 3.5–5.1)
Sodium: 138 mmol/L (ref 135–145)

## 2017-01-15 LAB — MRSA PCR SCREENING: MRSA by PCR: NEGATIVE

## 2017-01-15 MED ORDER — LORAZEPAM 2 MG/ML IJ SOLN
2.0000 mg | INTRAMUSCULAR | Status: DC | PRN
  Administered 2017-01-15 – 2017-01-16 (×10): 2 mg via INTRAVENOUS
  Administered 2017-01-16 – 2017-01-17 (×2): 3 mg via INTRAVENOUS
  Administered 2017-01-17 (×4): 2 mg via INTRAVENOUS
  Administered 2017-01-17 (×2): 3 mg via INTRAVENOUS
  Administered 2017-01-17: 2 mg via INTRAVENOUS
  Filled 2017-01-15 (×4): qty 1
  Filled 2017-01-15: qty 2
  Filled 2017-01-15 (×3): qty 1
  Filled 2017-01-15: qty 2
  Filled 2017-01-15: qty 1
  Filled 2017-01-15: qty 2
  Filled 2017-01-15: qty 1
  Filled 2017-01-15: qty 2
  Filled 2017-01-15 (×2): qty 1
  Filled 2017-01-15: qty 2
  Filled 2017-01-15 (×4): qty 1

## 2017-01-15 MED ORDER — DEXTROSE 5 % IV SOLN
1.0000 g | INTRAVENOUS | Status: DC
  Filled 2017-01-15 (×3): qty 10

## 2017-01-15 MED ORDER — ONDANSETRON HCL 4 MG/2ML IJ SOLN
4.0000 mg | Freq: Four times a day (QID) | INTRAMUSCULAR | Status: DC | PRN
  Administered 2017-01-15 – 2017-01-17 (×6): 4 mg via INTRAVENOUS
  Filled 2017-01-15 (×6): qty 2

## 2017-01-15 MED ORDER — ACETAMINOPHEN 650 MG RE SUPP: 650.0000 mg | Freq: Four times a day (QID) | RECTAL | Status: DC | PRN

## 2017-01-15 MED ORDER — ONDANSETRON HCL 4 MG PO TABS
4.0000 mg | ORAL_TABLET | Freq: Four times a day (QID) | ORAL | Status: DC | PRN
  Administered 2017-01-16: 4 mg via ORAL
  Filled 2017-01-15: qty 1

## 2017-01-15 MED ORDER — ACETAMINOPHEN 325 MG PO TABS: 650.0000 mg | ORAL_TABLET | Freq: Four times a day (QID) | ORAL | Status: DC | PRN

## 2017-01-15 NOTE — H&P (Addendum)
History and Physical    Kristine Macias ZCH:[id number] DOB: 16-Oct-1985 DOA: 01/14/2017  PCP: Leonides Sake, MD  Patient coming from: Home.  Chief Complaint: Seizures. Tremors.  HPI: Kristine Macias is a 31 y.o. female with history of alcohol abuse presents to the ER after patient was witnessed to have 2 seizures generalized tonic-clonic as witnessed by patient's husband. Patient's husband states at around 8:30 PM last night patient had a seizure generalized tonic-clonic lasting for less than a minute following which patient was postictal and confused. After a few minutes patient had another one similar. At this point patient was brought to the ER. Patient states she has had no seizures a month ago. At that time patient did not seek medical attention.   ED Course: CT head is unremarkable. Patient's last alcohol intake was yesterday. Patient denies cutting down on alcohol. She does take Klonopin off and on for anxiety which she has not taken last few days. Patient states she does have some nausea vomiting which probably could have decreased her intake of alcohol. But denies any abdominal pain. Patient is having tremors on exam. Admitted for alcohol withdrawal.  Review of Systems: As per HPI, rest all negative.   Past Medical History:  Diagnosis Date  . Alcohol abuse 11/2014   drinking since age 45. chronic, recurrent. at least 2 detox admits before 2015.   Marland Kitchen Alcoholic hepatitis 09/8784   hepatic steatosis on 11/2014 ultrasound.   . Breast cancer [facility])    age 76, lump removed from left breast  . Chlamydia 2007   bacterial vaginosis 09/2011  . Colitis 12/2014   colonoscopy for diarrhea and abnml CT 01/06/15: erythmatous TI (path: active ileitis, ? emerging IBD), rectal erythema (path: mucosal prolapse). Random bx of normal colon (path unremarkable)   . Congenital deafness    left ear only.  Also noted in mother and sibling (unilateral).   . Depression with anxiety initally at age 66   . Failure to thrive in adult 12/2014.    Malnutrition: n/v, not eating, weight loss, BMI 14.  s/p 01/11/2015 PEG (Dr Dorna Leitz).   . Hypertensive urgency   . Irregular heart beat 2010   wt/diet related after evaluation  . Pancreas divisum 02/2015   Type 1 seen on CT.   Marland Kitchen Pneumothorax, spontaneous, tension   . Renal disorder   . Seizure ([place]) 06/2015   due to ETOH/benzo withdrawal.   . Spontaneous pneumothorax 08/2012   right.  chest tube placed.   . Thrombocytopenia ([place]) 04/2007    Past Surgical History:  Procedure Laterality Date  . CESAREAN SECTION  07/2009    x 1.  G3, Para 2012.   . CHEST TUBE INSERTION    . COLONOSCOPY WITH PROPOFOL N/A 01/06/2015   [facility], Dr Rayann Heman. colonoscopy for diarrhea and abnml CT 01/06/15: erythmatous TI (path: active ileitis, ? emerging IBD), rectal erythema (path: mucosal prolapse). Random bx of normal colon (path unremarkable)   . ESOPHAGOGASTRODUODENOSCOPY N/A 01/06/2015   ;[facility], Dr Rayann Heman. For wt loss, N/V: Normal study, duodenal biopsy/pathology: chronic active duodenitis.   Marland Kitchen ESOPHAGOGASTRODUODENOSCOPY N/A 01/11/2015   Rein-normal with PEG placement  . PEG PLACEMENT N/A 01/11/2015   [facility], Dr Rayann Heman.  for N/V/wt loss/severe malnutrition.      reports that she has been smoking Cigarettes.  She has a 13.00 pack-year smoking history. She has never used smokeless tobacco. She reports that she drinks about 21.6 oz of alcohol per week . She reports that she  does not use drugs.  Allergies  Allergen Reactions  . Aspirin Shortness Of Breath  . Effexor [Venlafaxine] Anaphylaxis  . Gabapentin Anaphylaxis  . Sulfa Antibiotics Anaphylaxis  . Tetracyclines & Related Anaphylaxis  . Trazodone And Nefazodone Anaphylaxis  . Latex Hives and Itching  . Paxil [Paroxetine] Hives and Itching  . Seroquel [Quetiapine Fumarate] Hives and Itching  . Zoloft [Sertraline Hcl] Hives and Itching    Family History  Problem Relation Age of Onset  . Thyroid disease Mother   . Cancer  Mother        breast cancer  . Cancer Father        lung cancer  . Stroke Other   . Crohn's disease Brother   . Cancer Maternal Grandmother        breast cancer  . Anesthesia problems Neg Hx     Prior to Admission medications   Medication Sig Start Date End Date Taking? Authorizing Provider  clonazePAM (KLONOPIN) 0.5 MG tablet Take 0.5 mg by mouth 2 (two) times daily. 12/06/16  Yes [provider]  Melatonin 1 MG CAPS Take 1 mg by mouth at bedtime as needed. sleep   Yes [provider]  ondansetron (ZOFRAN) 4 MG tablet Take 4 mg by mouth every 6 (six) hours as needed for nausea. 10/25/16  Yes [provider]  thiamine 100 MG tablet Take 1 tablet (100 mg total) by mouth daily. 12/14/16  Yes Bonnielee Haff, MD  chlordiazePOXIDE (LIBRIUM) 25 MG capsule Take 2 tablets on 12/25/2016,, take 2 tablets morning and Wednesday evening and 12/26/2016, take 1 tablet on 12/27/2016. Patient not taking: Reported on 01/14/2017 12/25/16   Charlynne Cousins, MD  folic acid (FOLVITE) 1 MG tablet Take 1 tablet (1 mg total) by mouth daily. Patient not taking: Reported on 01/14/2017 08/06/16   Murlean Iba, MD    Physical Exam: Vitals:   01/14/17 2158 01/14/17 2217 01/14/17 2344 01/15/17 0029  BP: 122/88  122/88 (!) 158/106  Pulse: 96  (!) 104 (!) 117  Resp: 16   14  Temp:      TempSrc:      SpO2: 96% 96%  94%      Constitutional: Moderately built and nourished. Vitals:   01/14/17 2158 01/14/17 2217 01/14/17 2344 01/15/17 0029  BP: 122/88  122/88 (!) 158/106  Pulse: 96  (!) 104 (!) 117  Resp: 16   14  Temp:      TempSrc:      SpO2: 96% 96%  94%   Eyes: Anicteric no pallor. ENMT: No discharge from the ears eyes nose and mouth. Neck: No mass felt. No neck rigidity. Respiratory: No rhonchi or crepitations. Cardiovascular: S1-S2. No murmurs appreciated. Abdomen: Soft nontender bowel sounds present. Musculoskeletal: No edema. No joint effusion. Skin: No rash.  Skin appears warm. Neurologic: Alert awake oriented to time place and person. Moves all extremities. Tremors. Psychiatric: Appears normal.   Labs on Admission: I have personally reviewed following labs and imaging studies  CBC:  Recent Labs Lab 01/14/17 2314  WBC 10.2  NEUTROABS 5.7  HGB 15.3*  HCT 46.0  MCV 86.8  PLT 557*   Basic Metabolic Panel:  Recent Labs Lab 01/14/17 2314  NA 139  K 3.9  CL 100*  CO2 24  GLUCOSE 83  BUN 15  CREATININE 0.63  CALCIUM 8.4*   GFR: CrCl cannot be calculated (Unknown ideal weight.). Liver Function Tests:  Recent Labs Lab 01/14/17 2314  AST 51*  ALT  37  ALKPHOS 96  BILITOT 0.4  PROT 8.1  ALBUMIN 4.0    Recent Labs Lab 01/14/17 2314  LIPASE 18   No results for input(s): AMMONIA in the last 168 hours. Coagulation Profile: No results for input(s): INR, PROTIME in the last 168 hours. Cardiac Enzymes: No results for input(s): CKTOTAL, CKMB, CKMBINDEX, TROPONINI in the last 168 hours. BNP (last 3 results) No results for input(s): PROBNP in the last 8760 hours. HbA1C: No results for input(s): HGBA1C in the last 72 hours. CBG: No results for input(s): GLUCAP in the last 168 hours. Lipid Profile: No results for input(s): CHOL, HDL, LDLCALC, TRIG, CHOLHDL, LDLDIRECT in the last 72 hours. Thyroid Function Tests: No results for input(s): TSH, T4TOTAL, FREET4, T3FREE, THYROIDAB in the last 72 hours. Anemia Panel: No results for input(s): VITAMINB12, FOLATE, FERRITIN, TIBC, IRON, RETICCTPCT in the last 72 hours. Urine analysis:    Component Value Date/Time   COLORURINE YELLOW 01/14/2017 2245   APPEARANCEUR CLEAR 01/14/2017 2245   APPEARANCEUR Clear 02/15/2016 1456   LABSPEC 1.011 01/14/2017 2245   LABSPEC 1.006 12/20/2014 1018   PHURINE 6.0 01/14/2017 2245   GLUCOSEU NEGATIVE 01/14/2017 2245   GLUCOSEU Negative 12/20/2014 1018   HGBUR LARGE (A) 01/14/2017 2245   BILIRUBINUR NEGATIVE 01/14/2017 2245   BILIRUBINUR neg  03/13/2016 1700   BILIRUBINUR Negative 02/15/2016 1456   BILIRUBINUR Negative 12/20/2014 1018   KETONESUR NEGATIVE 01/14/2017 2245   PROTEINUR 30 (A) 01/14/2017 2245   UROBILINOGEN negative 03/13/2016 1700   UROBILINOGEN 0.2 06/13/2012 2306   NITRITE NEGATIVE 01/14/2017 2245   LEUKOCYTESUR TRACE (A) 01/14/2017 2245   LEUKOCYTESUR Negative 02/15/2016 1456   LEUKOCYTESUR Negative 12/20/2014 1018   Sepsis Labs: @LABRCNTIP (procalcitonin:4,lacticidven:4) )No results found for this or any previous visit (from the past 240 hour(s)).   Radiological Exams on Admission: Ct Head Wo Contrast  Result Date: 01/15/2017 CLINICAL DATA:  Two seizures on 01/14/2017, possibly due to alcohol withdrawal. EXAM: CT HEAD WITHOUT CONTRAST TECHNIQUE: Contiguous axial images were obtained from the base of the skull through the vertex without intravenous contrast. COMPARISON:  MRI brain 11/12/2016.  CT head 09/21/2016 FINDINGS: Brain: No evidence of acute infarction, hemorrhage, hydrocephalus, extra-axial collection or mass lesion/mass effect. Vascular: No hyperdense vessel or unexpected calcification. Skull: Normal. Negative for fracture or focal lesion. Sinuses/Orbits: No acute finding. Other: None. IMPRESSION: No acute intracranial abnormalities. Electronically Signed   By: Lucienne Capers M.D.   On: 01/15/2017 02:58     Assessment/Plan Principal Problem:   Alcohol withdrawal ([place]) Active Problems:   Withdrawal seizures ([place])    1. Alcohol withdrawal with seizures - CT head was unremarkable. Patient is placed on stepdown protocol for alcohol withdrawal. Closely observe. 2. Possible UTI - UA is positive for leukocyte esterase and many bacteria. Follow urine cultures. Patient is on ceftriaxone.   DVT prophylaxis: SCDs. Code Status: Full code.  Family Communication: Patient's husband.  Disposition Plan: Home.  Consults called: None.  Admission status: Observation.    Rise Patience MD [facility] Pager [phone].  If 7PM-7AM, please contact night-coverage [email or web address] Password [facility]  01/15/2017, 3:23 AM

## 2017-01-15 NOTE — Progress Notes (Signed)
Patient seen and examined this morning, admitted by Dr. Glyn Ade overnight, H&P reviewed and agree with the assessment and plan  In brief, 31 year old female with history of ongoing alcohol abuse and history of seizures in the setting of alcohol withdrawal who was admitted to the hospital on 5/22 with recurrent seizures in the setting of alcohol withdrawal.     Alcohol withdrawal seizures -CT of the head was negative -She was admitted to stepdown, keeping stepdown today on CIWA protocol -She tells me she takes Klonopin at home at baseline for her seizure disorder, but denies taking Klonopin at the same time with alcohol  Alcohol abuse -Consulted Education officer, museum, discussed at length with patient at bedside, would recommend complete cessation of her alcohol intake.  This is her seventh hospitalization this year alone for the same thing.  She tells me that when she left the hospital last time she was sober for about 2 weeks however started binge drinking again few days prior to this hospitalization.  Tells me that there is a lot of stress that she deals with a daily basis.  She is having counseling as an outpatient  Possible UTI -She has no dysuria, no fevers, however thinks she has increased frequency but she is not sure.  On ceftriaxone, cultures are pending.   Kristine Macias M. Cruzita Lederer, MD [facility] [phone]  If 7PM-7AM, please contact night-coverage [email or web address] Password [email or web address]

## 2017-01-15 NOTE — ED Notes (Signed)
PT refused to be administered Ceftriaxone, stated that she didn't want it. Writer explained purpose, dose frequency, and how risk out weighs benefits. Pt verbalized understanding.

## 2017-01-15 NOTE — ED Notes (Signed)
Pt was having active SZ. Claiborne Billings, G , PA was made aware and present during Z lasting 1 minute.Pt awy patent  Hr increased to 140's. Pt boyfriend is at bedside. Pt was able to answer questions post sz.

## 2017-01-15 NOTE — Care Management Note (Signed)
Case Management Note  Patient Details  Name: Kristine Macias MRN: [id number] Date of Birth: 1986-05-27  Subjective/Objective:                   31 y.o. female with history of alcohol abuse presents to the ER after patient was witnessed to have 2 seizures generalized tonic-clonic as witnessed by patient's husband. Patient's husband states at around 8:30 PM last night patient had a seizure generalized tonic-clonic lasting for less than a minute following which patient was postictal and confused. After a few minutes patient had another one similar. At this point patient was brought to the ER. Patient states she has had no seizures a month ago. At that time patient did not seek medical attention.   Action/Plan: Date:  Jan 15, 2017 Chart reviewed for concurrent status and case management needs. Will continue to follow patient progress. Discharge Planning: following for needs Expected discharge date: [id number] Velva Harman, BSN, [place], [place]  Expected Discharge Date:                  Expected Discharge Plan:  Home/Self Care  In-House Referral:     Discharge planning Services  CM Consult  Post Acute Care Choice:    Choice offered to:     DME Arranged:    DME Agency:     HH Arranged:    HH Agency:     Status of Service:  In process, will continue to follow  If discussed at Long Length of Stay Meetings, dates discussed:    Additional Comments:  Leeroy Cha, RN 01/15/2017, 8:55 AM

## 2017-01-16 NOTE — Progress Notes (Signed)
CSW received consult for "current substance abuse". CSW spoke with patient via bedside. Patient stated she has a therapist in the area that she speaks with. Patient also stated she previously received outpatient treatment at [facility] in [place]. Patient stated she would like to start receiving services from them. CSW provided patient with additional outpatient/ inpatient resources. Please contact CSW for any additional needs.   Kingsley Spittle, LCSWA Clinical Social Worker [phone]

## 2017-01-16 NOTE — Progress Notes (Signed)
Patient ID: Kristine Macias, female   DOB: 1985-12-12, 31 y.o.   MRN: [id number]    PROGRESS NOTE   Kristine Macias  POE:[id number] DOB: 1986/05/04 DOA: 01/14/2017  PCP: Patient, No Pcp Per   Brief Narrative:  Pt is 31 you female with known alcohol abuse who presented to [facility] after having two episodes of generalized tonic clonic seizures witnessed by her husband.   Assessment & Plan:   Principal Problem:   Alcohol withdrawal ([place]), Withdrawal seizures ([place]) - pt reports feeling bit better but says she is still tired and has no appetite - VSS this AM, blood work is also stable but I am worried about her sluggish mental status, ? Post ictal state - keep in SDU for now for monitoring  - keep on CIWA protocol for now - I will discuss with neurology team if ? Need for AED    Alcohol intoxication, alcohol abuse  - elevated alcohol level on admission - repeat level of EtOH in AM - will provide consultation on cessation once pt medically stable     ? UTI - on Rocephin  - follow up on urine culture   DVT prophylaxis: SCD's Code Status: Full  Family Communication: Patient at bedside  Disposition Plan: keep in SDU for now until pt more alert   Consultants:   None  Procedures:   None  Antimicrobials:   Rocephin   Subjective: No events overnight.   Objective: Vitals:   01/16/17 0800 01/16/17 0900 01/16/17 1200 01/16/17 1300  BP: 108/71 103/72 (!) 126/91 111/80  Pulse: 69 67 76 94  Resp: 14 15 18 16   Temp: 98.2 F (36.8 C)  98.2 F (36.8 C)   TempSrc: Oral  Oral   SpO2: 95% 93% 94% 95%  Weight:      Height:        Intake/Output Summary (Last 24 hours) at 01/16/17 1406 Last data filed at 01/15/17 1705  Gross per 24 hour  Intake                0 ml  Output              150 ml  Net             -150 ml   Filed Weights   01/15/17 0327 01/16/17 0400  Weight: 65 kg (143 lb 4.8 oz) 69 kg (152 lb 1.9 oz)    Examination:  General exam: Appears sluggish and  somnolent   Respiratory system: Clear to auscultation. Respiratory effort normal. Cardiovascular system: S1 & S2 heard, RRR. No JVD, murmurs, rubs, gallops or clicks. No pedal edema. Gastrointestinal system: Abdomen is nondistended, soft and nontender. No organomegaly or masses felt.  Data Reviewed: I have personally reviewed following labs and imaging studies  CBC:  Recent Labs Lab 01/14/17 2314 01/15/17 0524  WBC 10.2 9.6  NEUTROABS 5.7  --   HGB 15.3* 13.3  HCT 46.0 41.5  MCV 86.8 87.6  PLT 414* 315   Basic Metabolic Panel:  Recent Labs Lab 01/14/17 2314 01/15/17 0524  NA 139 138  K 3.9 4.1  CL 100* 104  CO2 24 21*  GLUCOSE 83 65  BUN 15 16  CREATININE 0.63 0.63  CALCIUM 8.4* 8.4*   Liver Function Tests:  Recent Labs Lab 01/14/17 2314  AST 51*  ALT 37  ALKPHOS 96  BILITOT 0.4  PROT 8.1  ALBUMIN 4.0    Recent Labs Lab 01/14/17 2314  LIPASE 18  Urine analysis:    Component Value Date/Time   COLORURINE YELLOW 01/14/2017 2245   APPEARANCEUR CLEAR 01/14/2017 2245   APPEARANCEUR Clear 02/15/2016 1456   LABSPEC 1.011 01/14/2017 2245   LABSPEC 1.006 12/20/2014 1018   PHURINE 6.0 01/14/2017 2245   GLUCOSEU NEGATIVE 01/14/2017 2245   GLUCOSEU Negative 12/20/2014 1018   HGBUR LARGE (A) 01/14/2017 2245   BILIRUBINUR NEGATIVE 01/14/2017 2245   BILIRUBINUR neg 03/13/2016 1700   BILIRUBINUR Negative 02/15/2016 1456   BILIRUBINUR Negative 12/20/2014 [place] 01/14/2017 2245   PROTEINUR 30 (A) 01/14/2017 2245   UROBILINOGEN negative 03/13/2016 1700   UROBILINOGEN 0.2 06/13/2012 2306   NITRITE NEGATIVE 01/14/2017 2245   LEUKOCYTESUR TRACE (A) 01/14/2017 2245   LEUKOCYTESUR Negative 02/15/2016 1456   LEUKOCYTESUR Negative 12/20/2014 1018   Recent Results (from the past 240 hour(s))  MRSA PCR Screening     Status: None   Collection Time: 01/15/17  3:31 AM  Result Value Ref Range Status   MRSA by PCR NEGATIVE NEGATIVE Final      Radiology Studies: Ct Head Wo Contrast  Result Date: 01/15/2017 CLINICAL DATA:  Two seizures on 01/14/2017, possibly due to alcohol withdrawal. EXAM: CT HEAD WITHOUT CONTRAST TECHNIQUE: Contiguous axial images were obtained from the base of the skull through the vertex without intravenous contrast. COMPARISON:  MRI brain 11/12/2016.  CT head 09/21/2016 FINDINGS: Brain: No evidence of acute infarction, hemorrhage, hydrocephalus, extra-axial collection or mass lesion/mass effect. Vascular: No hyperdense vessel or unexpected calcification. Skull: Normal. Negative for fracture or focal lesion. Sinuses/Orbits: No acute finding. Other: None. IMPRESSION: No acute intracranial abnormalities. Electronically Signed   By: Lucienne Capers M.D.   On: 01/15/2017 02:58   Scheduled Meds: . folic acid  1 mg Oral Daily   Continuous Infusions: . cefTRIAXone (ROCEPHIN)  IV       LOS: 1 day   Time spent: 20 minutes   Faye Ramsay, MD [facility] Pager [phone]  If 7PM-7AM, please contact night-coverage [email or web address] Password [email or web address] 01/16/2017, 2:06 PM

## 2017-01-17 LAB — COMPREHENSIVE METABOLIC PANEL WITH GFR
ALT: 40 U/L (ref 14–54)
AST: 58 U/L — ABNORMAL HIGH (ref 15–41)
Albumin: 3.8 g/dL (ref 3.5–5.0)
Alkaline Phosphatase: 74 U/L (ref 38–126)
Anion gap: 11 (ref 5–15)
BUN: 17 mg/dL (ref 6–20)
CO2: 23 mmol/L (ref 22–32)
Calcium: 9.5 mg/dL (ref 8.9–10.3)
Chloride: 101 mmol/L (ref 101–111)
Creatinine, Ser: 0.63 mg/dL (ref 0.44–1.00)
GFR calc Af Amer: >60 mL/min
GFR calc non Af Amer: >60 mL/min
Glucose, Bld: 104 mg/dL — ABNORMAL HIGH (ref 65–99)
Potassium: 3.8 mmol/L (ref 3.5–5.1)
Sodium: 135 mmol/L (ref 135–145)
Total Bilirubin: 0.7 mg/dL (ref 0.3–1.2)
Total Protein: 7.7 g/dL (ref 6.5–8.1)

## 2017-01-17 LAB — CBC
HCT: 40.9 % (ref 36.0–46.0)
Hemoglobin: 13.1 g/dL (ref 12.0–15.0)
MCH: 28.4 pg (ref 26.0–34.0)
MCHC: 32 g/dL (ref 30.0–36.0)
MCV: 88.7 fL (ref 78.0–100.0)
Platelets: 272 10*3/uL (ref 150–400)
RBC: 4.61 MIL/uL (ref 3.87–5.11)
RDW: 15.1 % (ref 11.5–15.5)
WBC: 7.6 10*3/uL (ref 4.0–10.5)

## 2017-01-17 MED ORDER — SODIUM CHLORIDE 0.9 % IV SOLN
INTRAVENOUS | Status: DC
  Administered 2017-01-17: 1000 mL via INTRAVENOUS

## 2017-01-17 NOTE — Clinical Social Work Note (Signed)
Clinical Social Work Assessment  Patient Details  Name: Kristine Macias MRN: [id number] Date of Birth: 1986/03/15  Date of referral:  01/17/17               Reason for consult:  Substance Use/ETOH Abuse                Permission sought to share information with:    Permission granted to share information::     Name::        Agency::     Relationship::     Contact Information:     Housing/Transportation Living arrangements for the past 2 months:  [place] of Information:  Patient Patient Interpreter Needed:  None Criminal Activity/Legal Involvement Pertinent to Current Situation/Hospitalization:    Significant Relationships:    Lives with:  Self Do you feel safe going back to the place where you live?  Yes Need for family participation in patient care:  Yes (Comment)  Care giving concerns:  Patient would like to start counseling services and outpatient treatment once discharged from hospital.    Social Worker assessment / plan:CSW received consult for "current substance abuse". CSW spoke with patient via bedside. Patient stated she has a therapist in the area that she speaks with. Patient also stated she previously received outpatient treatment at [facility] in [place]. Patient stated she would like to start receiving services from them. CSW provided patient with additional outpatient/ inpatient resources. Please contact CSW for any additional needs.    Employment status:    Insurance information:  Medicaid In [place] PT Recommendations:  Not assessed at this time Information / Referral to community resources:     Patient/Family's Response to care: Patient appreciated CSW.  Patient/Family's Understanding of and Emotional Response to Diagnosis, Current Treatment, and Prognosis:Understands current prognosis and treatment.  Emotional Assessment Appearance:  Appears stated age Attitude/Demeanor/Rapport:    Affect (typically observed):   Pleasant, Quiet, Calm Orientation:  Oriented to Self, Oriented to Place, Oriented to  Time, Oriented to Situation Alcohol / Substance use:    Psych involvement (Current and /or in the community):  No (Comment)  Discharge Needs  Concerns to be addressed:  No discharge needs identified Readmission within the last 30 days:  Yes Current discharge risk:  None Barriers to Discharge:  No Barriers Identified   Weston Anna, LCSW 01/17/2017, 7:54 AM

## 2017-01-17 NOTE — Progress Notes (Signed)
Patient ID: Kristine Macias, female   DOB: 1986-06-13, 31 y.o.   MRN: [id number]    PROGRESS NOTE    Yailen Zemaitis  KGY:[id number] DOB: [birth year] DOA: 01/14/2017  PCP: Patient, No Pcp Per   Brief Narrative:  Pt is 31 yo female with known alcohol abuse who presented to [facility] after having two episodes of generalized tonic clonic seizures witnessed by her husband.   Major events since admission:  5/24 - patient more alert this morning, able to participate physical exam, still on Ativan as needed  Assessment & Plan:   Principal Problem:   Alcohol withdrawal ([place]), Withdrawal seizures ([place]) - Patient is more alert this morning, still with poor oral intake - Vital signs remained stable, per RN report patient has already gotten 3 doses of Ativan since earlier this morning  - keep on CIWA protocol  - d/w neurology, Ok to continue same regimen for now with Benzo's - no need for AED regimen     Alcohol intoxication, alcohol abuse  - elevated alcohol level on admission - cessation consultation provided - check EtOH level in AM    ? UTI - on Rocephin but apparently has not gotten it, ? Pt refused - currently no fevers, WBC is stable, pt with no specific urinary symptoms - ok to keep off ABX for now  DVT prophylaxis: Lovenox Sq Code Status: Full  Family Communication: Patient at bedside  Disposition Plan: once score better on CIWA, can transfer to med unit   Consultants:   None   Procedures:   None  Antimicrobials:   None  Subjective: Pt reports feeling better.   Objective: Vitals:   01/17/17 0325 01/17/17 0400 01/17/17 0800 01/17/17 1000  BP:  (!) 128/99 111/86 118/89  Pulse:  89 93 71  Resp:  16 (!) 23 15  Temp: 97 F (36.1 C)  98.3 F (36.8 C)   TempSrc: Axillary  Oral   SpO2:  97% 95% 95%  Weight:      Height:        Intake/Output Summary (Last 24 hours) at 01/17/17 1129 Last data filed at 01/16/17 2200  Gross per 24 hour  Intake                 0 ml  Output              100 ml  Net             -100 ml   Filed Weights   01/15/17 0327 01/16/17 0400  Weight: 65 kg (143 lb 4.8 oz) 69 kg (152 lb 1.9 oz)    Examination:  General exam: Appears calm and comfortable  Respiratory system: Respiratory effort normal. Cardiovascular system: S1 & S2 heard, RRR. No JVD, murmurs, rubs, gallops or clicks. Gastrointestinal system: Abdomen is nondistended, soft and nontender. No organomegaly or masses felt.  Central nervous system: Alert and oriented. No focal neurological deficits.  Data Reviewed: I have personally reviewed following labs and imaging studies  CBC:  Recent Labs Lab 01/14/17 2314 01/15/17 0524 01/17/17 0310  WBC 10.2 9.6 7.6  NEUTROABS 5.7  --   --   HGB 15.3* 13.3 13.1  HCT 46.0 41.5 40.9  MCV 86.8 87.6 88.7  PLT 414* 336 026   Basic Metabolic Panel:  Recent Labs Lab 01/14/17 2314 01/15/17 0524 01/17/17 0310  NA 139 138 135  K 3.9 4.1 3.8  CL 100* 104 101  CO2 24 21* 23  GLUCOSE 83  65 104*  BUN 15 16 17   CREATININE 0.63 0.63 0.63  CALCIUM 8.4* 8.4* 9.5   Liver Function Tests:  Recent Labs Lab 01/14/17 2314 01/17/17 0310  AST 51* 58*  ALT 37 40  ALKPHOS 96 74  BILITOT 0.4 0.7  PROT 8.1 7.7  ALBUMIN 4.0 3.8    Recent Labs Lab 01/14/17 2314  LIPASE 18   Urine analysis:    Component Value Date/Time   COLORURINE YELLOW 01/14/2017 2245   APPEARANCEUR CLEAR 01/14/2017 2245   APPEARANCEUR Clear 02/15/2016 1456   LABSPEC 1.011 01/14/2017 2245   LABSPEC 1.006 12/20/2014 1018   PHURINE 6.0 01/14/2017 2245   GLUCOSEU NEGATIVE 01/14/2017 2245   GLUCOSEU Negative 12/20/2014 1018   HGBUR LARGE (A) 01/14/2017 2245   BILIRUBINUR NEGATIVE 01/14/2017 2245   BILIRUBINUR neg 03/13/2016 1700   BILIRUBINUR Negative 02/15/2016 1456   BILIRUBINUR Negative 12/20/2014 1018   KETONESUR NEGATIVE 01/14/2017 2245   PROTEINUR 30 (A) 01/14/2017 2245   UROBILINOGEN negative 03/13/2016 1700   UROBILINOGEN  0.2 06/13/2012 2306   NITRITE NEGATIVE 01/14/2017 2245   LEUKOCYTESUR TRACE (A) 01/14/2017 2245   LEUKOCYTESUR Negative 02/15/2016 1456   LEUKOCYTESUR Negative 12/20/2014 1018   Recent Results (from the past 240 hour(s))  MRSA PCR Screening     Status: None   Collection Time: 01/15/17  3:31 AM  Result Value Ref Range Status   MRSA by PCR NEGATIVE NEGATIVE Final    Comment:        The GeneXpert MRSA Assay (FDA approved for NASAL specimens only), is one component of a comprehensive MRSA colonization surveillance program. It is not intended to diagnose MRSA infection nor to guide or monitor treatment for MRSA infections.      Radiology Studies: No results found.  Scheduled Meds: . folic acid  1 mg Oral Daily   Continuous Infusions: . cefTRIAXone (ROCEPHIN)  IV       LOS: 2 days   Time spent: 20 minutes   Faye Ramsay, MD [facility] Pager [phone]  If 7PM-7AM, please contact night-coverage [email or web address] Password [email or web address] 01/17/2017, 11:29 AM

## 2017-01-18 ENCOUNTER — Inpatient Hospital Stay (HOSPITAL_COMMUNITY)
Admission: EM | Admit: 2017-01-18 | Discharge: 2017-01-20 | Disposition: A | Discharge status: Home / Self Care | Payer: Medicaid Other | Source: Home / Self Care | Attending: Family Medicine | Admitting: Family Medicine

## 2017-01-18 ENCOUNTER — Encounter (HOSPITAL_COMMUNITY): Payer: Self-pay | Admitting: Emergency Medicine

## 2017-01-18 DIAGNOSIS — F411 Generalized anxiety disorder: Secondary | ICD-10-CM | POA: Diagnosis present

## 2017-01-18 DIAGNOSIS — F10930 Alcohol use, unspecified with withdrawal, uncomplicated: Secondary | ICD-10-CM

## 2017-01-18 DIAGNOSIS — F10939 Alcohol use, unspecified with withdrawal, unspecified: Secondary | ICD-10-CM | POA: Diagnosis present

## 2017-01-18 DIAGNOSIS — F10239 Alcohol dependence with withdrawal, unspecified: Secondary | ICD-10-CM | POA: Diagnosis present

## 2017-01-18 DIAGNOSIS — F10230 Alcohol dependence with withdrawal, uncomplicated: Secondary | ICD-10-CM

## 2017-01-18 DIAGNOSIS — K7010 Alcoholic hepatitis without ascites: Secondary | ICD-10-CM | POA: Diagnosis present

## 2017-01-18 LAB — URINE CULTURE

## 2017-01-18 LAB — BASIC METABOLIC PANEL
Anion gap: 11 (ref 5–15)
BUN: 13 mg/dL (ref 6–20)
CALCIUM: 9.3 mg/dL (ref 8.9–10.3)
CO2: 23 mmol/L (ref 22–32)
CREATININE: 0.72 mg/dL (ref 0.44–1.00)
Chloride: 103 mmol/L (ref 101–111)
GFR calc Af Amer: >60 mL/min (ref >60)
GFR calc non Af Amer: >60 mL/min (ref >60)
Glucose, Bld: 101 mg/dL — ABNORMAL HIGH (ref 65–99)
Potassium: 4.2 mmol/L (ref 3.5–5.1)
Sodium: 137 mmol/L (ref 135–145)

## 2017-01-18 LAB — COMPREHENSIVE METABOLIC PANEL
ALK PHOS: 57 U/L (ref 38–126)
ALT: 37 U/L (ref 14–54)
AST: 47 U/L — ABNORMAL HIGH (ref 15–41)
Albumin: 3.3 g/dL — ABNORMAL LOW (ref 3.5–5.0)
Anion gap: 9 (ref 5–15)
BUN: 10 mg/dL (ref 6–20)
CALCIUM: 8.1 mg/dL — AB (ref 8.9–10.3)
CO2: 19 mmol/L — AB (ref 22–32)
CREATININE: 0.47 mg/dL (ref 0.44–1.00)
Chloride: 108 mmol/L (ref 101–111)
GFR calc non Af Amer: >60 mL/min (ref >60)
GLUCOSE: 83 mg/dL (ref 65–99)
Potassium: 3.8 mmol/L (ref 3.5–5.1)
SODIUM: 136 mmol/L (ref 135–145)
Total Bilirubin: 0.5 mg/dL (ref 0.3–1.2)
Total Protein: 6.1 g/dL — ABNORMAL LOW (ref 6.5–8.1)

## 2017-01-18 LAB — CBC
HEMATOCRIT: 35.5 % — AB (ref 36.0–46.0)
HEMOGLOBIN: 11.1 g/dL — AB (ref 12.0–15.0)
MCH: 28.2 pg (ref 26.0–34.0)
MCHC: 31.3 g/dL (ref 30.0–36.0)
MCV: 90.3 fL (ref 78.0–100.0)
Platelets: 218 10*3/uL (ref 150–400)
RBC: 3.93 MIL/uL (ref 3.87–5.11)
RDW: 15 % (ref 11.5–15.5)
WBC: 7.2 10*3/uL (ref 4.0–10.5)

## 2017-01-18 LAB — CBC WITH DIFFERENTIAL/PLATELET
BASOS ABS: 0 10*3/uL (ref 0.0–0.1)
BASOS PCT: 0 %
EOS PCT: 1 %
Eosinophils Absolute: 0.1 10*3/uL (ref 0.0–0.7)
HCT: 40.5 % (ref 36.0–46.0)
HEMOGLOBIN: 12.9 g/dL (ref 12.0–15.0)
Lymphocytes Relative: 18 %
Lymphs Abs: 1.7 10*3/uL (ref 0.7–4.0)
MCH: 28.7 pg (ref 26.0–34.0)
MCHC: 31.9 g/dL (ref 30.0–36.0)
MCV: 90 fL (ref 78.0–100.0)
Monocytes Absolute: 0.6 10*3/uL (ref 0.1–1.0)
Monocytes Relative: 6 %
NEUTROS PCT: 75 %
Neutro Abs: 7.1 10*3/uL (ref 1.7–7.7)
Platelets: 266 10*3/uL (ref 150–400)
RBC: 4.5 MIL/uL (ref 3.87–5.11)
RDW: 15.2 % (ref 11.5–15.5)
WBC: 9.5 10*3/uL (ref 4.0–10.5)

## 2017-01-18 LAB — I-STAT BETA HCG BLOOD, ED (MC, WL, AP ONLY): I-stat hCG, quantitative: <5 m[IU]/mL (ref <5)

## 2017-01-18 LAB — MRSA PCR SCREENING: MRSA by PCR: NEGATIVE

## 2017-01-18 LAB — ETHANOL: Alcohol, Ethyl (B): <5 mg/dL (ref <5)

## 2017-01-18 MED ORDER — DIPHENHYDRAMINE HCL 25 MG PO CAPS: 25.0000 mg | ORAL_CAPSULE | Freq: Every evening | ORAL | Status: DC | PRN

## 2017-01-18 MED ORDER — LORAZEPAM 2 MG/ML IJ SOLN
2.0000 mg | INTRAMUSCULAR | Status: DC | PRN
  Administered 2017-01-18: 3 mg via INTRAVENOUS
  Administered 2017-01-18 (×4): 2 mg via INTRAVENOUS
  Administered 2017-01-19: 3 mg via INTRAVENOUS
  Administered 2017-01-19 (×2): 2 mg via INTRAVENOUS
  Administered 2017-01-19: 3 mg via INTRAVENOUS
  Administered 2017-01-20 (×5): 2 mg via INTRAVENOUS
  Filled 2017-01-18 (×4): qty 1
  Filled 2017-01-18: qty 2
  Filled 2017-01-18 (×3): qty 1
  Filled 2017-01-18: qty 2
  Filled 2017-01-18 (×3): qty 1
  Filled 2017-01-18: qty 2
  Filled 2017-01-18 (×2): qty 1

## 2017-01-18 MED ORDER — ONDANSETRON HCL 4 MG PO TABS: 4.0000 mg | ORAL_TABLET | Freq: Four times a day (QID) | ORAL | Status: DC | PRN

## 2017-01-18 MED ORDER — VITAMIN B-1 100 MG PO TABS
100.0000 mg | ORAL_TABLET | Freq: Every day | ORAL | Status: DC
  Administered 2017-01-19 – 2017-01-20 (×2): 100 mg via ORAL
  Filled 2017-01-18 (×2): qty 1

## 2017-01-18 MED ORDER — ONDANSETRON HCL 4 MG/2ML IJ SOLN
4.0000 mg | Freq: Four times a day (QID) | INTRAMUSCULAR | Status: DC | PRN
  Administered 2017-01-18 – 2017-01-19 (×2): 4 mg via INTRAVENOUS
  Filled 2017-01-18: qty 2

## 2017-01-18 MED ORDER — ONDANSETRON HCL 4 MG/2ML IJ SOLN
INTRAMUSCULAR | Status: AC
  Filled 2017-01-18: qty 2

## 2017-01-18 MED ORDER — SODIUM CHLORIDE 0.9 % IV BOLUS (SEPSIS)
1000.0000 mL | Freq: Once | INTRAVENOUS | Status: AC
  Administered 2017-01-18: 1000 mL via INTRAVENOUS

## 2017-01-18 MED ORDER — THIAMINE HCL 100 MG/ML IJ SOLN
Freq: Once | INTRAVENOUS | Status: AC
  Administered 2017-01-18: 04:00:00 via INTRAVENOUS
  Filled 2017-01-18: qty 1000

## 2017-01-18 MED ORDER — FOLIC ACID 5 MG/ML IJ SOLN
1.0000 mg | Freq: Every day | INTRAMUSCULAR | Status: DC
  Administered 2017-01-19: 1 mg via INTRAVENOUS
  Filled 2017-01-18: qty 0.2

## 2017-01-18 MED ORDER — SODIUM CHLORIDE 0.9 % IV SOLN
INTRAVENOUS | Status: DC
  Administered 2017-01-18: 02:00:00 via INTRAVENOUS

## 2017-01-18 MED ORDER — THIAMINE HCL 100 MG/ML IJ SOLN
100.0000 mg | Freq: Once | INTRAMUSCULAR | Status: AC
  Administered 2017-01-18: 100 mg via INTRAVENOUS
  Filled 2017-01-18: qty 2

## 2017-01-18 MED ORDER — ADULT MULTIVITAMIN W/MINERALS CH
1.0000 | ORAL_TABLET | Freq: Every day | ORAL | Status: DC
  Administered 2017-01-19 – 2017-01-20 (×2): 1 via ORAL
  Filled 2017-01-18 (×2): qty 1

## 2017-01-18 MED ORDER — LORAZEPAM 2 MG/ML IJ SOLN
1.0000 mg | Freq: Once | INTRAMUSCULAR | Status: AC
  Administered 2017-01-18: 1 mg via INTRAVENOUS
  Filled 2017-01-18: qty 1

## 2017-01-18 MED ORDER — SODIUM CHLORIDE 0.9% FLUSH
3.0000 mL | Freq: Two times a day (BID) | INTRAVENOUS | Status: DC
  Administered 2017-01-18 – 2017-01-20 (×4): 3 mL via INTRAVENOUS

## 2017-01-18 MED ORDER — ENOXAPARIN SODIUM 40 MG/0.4ML ~~LOC~~ SOLN
40.0000 mg | Freq: Every day | SUBCUTANEOUS | Status: DC
  Administered 2017-01-18 – 2017-01-20 (×3): 40 mg via SUBCUTANEOUS
  Filled 2017-01-18 (×3): qty 0.4

## 2017-01-18 NOTE — ED Triage Notes (Signed)
Pt. reported that she walked out from [employer] unit this evening because security will not let her boyfriend see her , pt. stated that she feels like having seizure activity with chills and tremors .

## 2017-01-18 NOTE — ED Provider Notes (Signed)
TIME SEEN: 1:47 AM  By signing my name below, I, Ny'Kea Lewis, attest that this documentation has been prepared under the direction and in the presence of Jaymari Cromie, Delice Bison, DO. Electronically Signed: Lise Auer, ED Scribe. 01/18/17. 2:04 AM.  CHIEF COMPLAINT: Seizures  HPI: HPI Comments: Kristine Macias is a 31 y.o. female with a PMHx of EtOH abuse, seizures and alcoholic hepatitis, who presents to the Emergency Department complaining of seizures due to alcohol withdrawal. She notes associated headache, nausea, vomiting, and visual hallucinations. Her last seizure was two days ago and her last EtOH drink was three days ago. She reports she drinks 4-5 22oz 8% beers. Pt reported she walked out of [employer] unit this evening because her husband was not allowed in. Denies illicit drug usage. Denies suicidal or homicidal ideations.    It appears per notes that at [facility] patient and her significant other were found smoking in the room. They were told not to smoke several times. Boyfriend was asked to leave and patient became upset because he was asked to leave. She left AGAINST MEDICAL ADVICE.  ROS: See HPI Constitutional: no fever  Eyes: no drainage  ENT: no runny nose   Cardiovascular:  no chest pain  Resp: no SOB  GI: vomiting and nausea  GU: no dysuria Integumentary: no rash  Allergy: no hives  Musculoskeletal: no leg swelling  Neurological: Visual hallucinations and headache.  No slurred speech.  ROS otherwise negative  PAST MEDICAL HISTORY/PAST SURGICAL HISTORY:  Past Medical History:  Diagnosis Date  . Alcohol abuse 11/2014   drinking since age 47. chronic, recurrent. at least 2 detox admits before 2015.   Marland Kitchen Alcoholic hepatitis 03/5026   hepatic steatosis on 11/2014 ultrasound.   . Breast cancer [facility])    age 31, lump removed from left breast  . Chlamydia 2007   bacterial vaginosis 09/2011  . Colitis 12/2014   colonoscopy for diarrhea and abnml CT 01/06/15:  erythmatous TI (path: active ileitis, ? emerging IBD), rectal erythema (path: mucosal prolapse). Random bx of normal colon (path unremarkable)   . Congenital deafness    left ear only.  Also noted in mother and sibling (unilateral).   . Depression with anxiety initally at age 59  . Failure to thrive in adult 12/2014.    Malnutrition: n/v, not eating, weight loss, BMI 14.  s/p 01/11/2015 PEG (Dr Dorna Leitz).   . Hypertensive urgency   . Irregular heart beat 2010   wt/diet related after evaluation  . Pancreas divisum 02/2015   Type 1 seen on CT.   Marland Kitchen Pneumothorax, spontaneous, tension   . Renal disorder   . Seizure ([place]) 06/2015   due to ETOH/benzo withdrawal.   . Spontaneous pneumothorax 08/2012   right.  chest tube placed.   . Thrombocytopenia ([place]) 04/2007    MEDICATIONS:  Prior to Admission medications   Medication Sig Start Date End Date Taking? Authorizing Provider  chlordiazePOXIDE (LIBRIUM) 25 MG capsule Take 2 tablets on 12/25/2016,, take 2 tablets morning and Wednesday evening and 12/26/2016, take 1 tablet on 12/27/2016. Patient not taking: Reported on 01/14/2017 12/25/16   Charlynne Cousins, MD  clonazePAM (KLONOPIN) 0.5 MG tablet Take 0.5 mg by mouth 2 (two) times daily. 12/06/16   [provider]  folic acid (FOLVITE) 1 MG tablet Take 1 tablet (1 mg total) by mouth daily. Patient not taking: Reported on 01/14/2017 08/06/16   Murlean Iba, MD  Melatonin 1 MG CAPS Take 1 mg  by mouth at bedtime as needed. sleep    [provider]  ondansetron (ZOFRAN) 4 MG tablet Take 4 mg by mouth every 6 (six) hours as needed for nausea. 10/25/16   [provider]  thiamine 100 MG tablet Take 1 tablet (100 mg total) by mouth daily. 12/14/16   Bonnielee Haff, MD    ALLERGIES:  Allergies  Allergen Reactions  . Aspirin Shortness Of Breath  . Effexor [Venlafaxine] Anaphylaxis  . Gabapentin Anaphylaxis  . Sulfa Antibiotics Anaphylaxis  . Tetracyclines & Related  Anaphylaxis  . Trazodone And Nefazodone Anaphylaxis  . Latex Hives and Itching  . Paxil [Paroxetine] Hives and Itching  . Seroquel [Quetiapine Fumarate] Hives and Itching  . Zoloft [Sertraline Hcl] Hives and Itching    SOCIAL HISTORY:  Social History  Substance Use Topics  . Smoking status: Current Every Day Smoker    Packs/day: 1.00    Years: 13.00    Types: Cigarettes  . Smokeless tobacco: Never Used  . Alcohol use 21.6 oz/week    36 Cans of beer per week     Comment: Drinking for past 4-5 days    FAMILY HISTORY: Family History  Problem Relation Age of Onset  . Thyroid disease Mother   . Cancer Mother        breast cancer  . Cancer Father        lung cancer  . Stroke Other   . Crohn's disease Brother   . Cancer Maternal Grandmother        breast cancer  . Anesthesia problems Neg Hx     EXAM: BP (!) 128/94   Pulse (!) 116   Temp 98.8 F (37.1 C) (Oral)   Resp 18   LMP 12/20/2016   SpO2 94%  CONSTITUTIONAL: Alert and oriented and responds appropriately to questions. Well-appearing; well-nourished HEAD: Normocephalic EYES: Conjunctivae clear, pupils appear equal, EOMI ENT: normal nose; moist mucous membranes NECK: Supple, no meningismus, no nuchal rigidity, no LAD  CARD:Regular and tachycardic; S1 and S2 appreciated; no murmurs, no clicks, no rubs, no gallops RESP: Normal chest excursion without splinting or tachypnea; breath sounds clear and equal bilaterally; no wheezes, no rhonchi, no rales, no hypoxia or respiratory distress, speaking full sentences ABD/GI: Normal bowel sounds; non-distended; soft, non-tender, no rebound, no guarding, no peritoneal signs, no hepatosplenomegaly BACK:  The back appears normal and is non-tender to palpation, there is no CVA tenderness EXT: Normal ROM in all joints; non-tender to palpation; no edema; normal capillary refill; no cyanosis, no calf tenderness or swelling    SKIN: Normal color for age and race; warm; no rash NEURO:  Moves all extremities equally, tremulous.  PSYCH: The patient's mood and manner are appropriate. Grooming and personal hygiene are appropriate. Visual hallucinations.   MEDICAL DECISION MAKING: Patient here with alcohol withdrawal. She is tachycardic, tremulous and reports hallucinations. Last alcoholic beverage was 72 hours ago. We'll give IV fluids, IV Ativan. We'll repeat labs and discuss with hospitalist. She has been admitted several times in the past for alcohol withdrawal seizures, DTs.  ED PROGRESS: Vital signs, tremors improving after fluids and Ativan. Repeat labs unremarkable.   2:35 AM Discussed patient's case with hospitalist, Dr. Loleta Books.  I have recommended admission and patient (and family if present) agree with this plan. Admitting physician will place admission orders.   I reviewed all nursing notes, vitals, pertinent previous records, EKGs, lab and urine results, imaging (as available).       EKG Interpretation  Date/Time:  Friday Jan 18 2017 02:20:41 EDT Ventricular Rate:  94 PR Interval:    QRS Duration: 85 QT Interval:  355 QTC Calculation: 444 R Axis:   83 Text Interpretation:  Sinus rhythm Confirmed by Renji Berwick,  DO, Elyssa Pendelton ([id number]) on 01/18/2017 2:27:25 AM        .I personally performed the services described in this documentation, which was scribed in my presence. The recorded information has been reviewed and is accurate.     Atalaya Zappia, Delice Bison, DO 01/18/17 [id number]

## 2017-01-18 NOTE — Progress Notes (Signed)
Notified by charge nurse due to pt smoking/vaping in room.I spoke to pt and boyfriend last night about smoking in room. When I entered room tonight I questioned pt and her boyfriend about instructions they were both given last night. Both verbalized that they remembered discussion last night. I informed pt that she was seen smoking on camera by staff. Pt admitted to smoking in room. Boyfriend asked to leave. Instructed boyfriend that he was not to return to hospital unless seeking medical attention.  Called back to room to speak with pt. Pt upset and argumentative about boyfriend not being able to come back. I spoke with Dr. Hal Hope in regards to pt smoking in room. Dr. Raliegh Ip ok to discharge pt against medical advice due to smoking in room.

## 2017-01-18 NOTE — Plan of Care (Signed)
Problem: Anxiety Goal: Ability to verbalize feelings will improve by discharge Outcome: Progressing Pt on CIWA protocol, ativan administered PRN, pt currently calm, no distress noted.

## 2017-01-18 NOTE — ED Notes (Signed)
Admitting Provider at bedside. 

## 2017-01-18 NOTE — Progress Notes (Signed)
PROGRESS NOTE    Kristine Macias  IRW:[id number]  DOB: 1986-06-13  DOA: 01/18/2017 PCP: Patient, No Pcp Per Outpatient Specialists:   Hospital course: Kristine Macias is a 31 y.o. female with a past medical history significant for alcohol dependence and frequent withdrawals, withdrawal seizures, and hx of remote spontaneous pneumothoraces who presents with withdrawal.  Assessment & Plan:   1. Alcohol dependence with withdrawals and seizures:  Last Seizure Monday evening before admission.  Seizures only in the context of alcohol withdrawal.  Now 4 days from last drink, risk window for DTs, especially in this patient with severe ongoing alcohol abuse, frequent severe withdrawals. -CIWA stepdown unit protocol, frequent scoring -Diphenhydramine for sleep -Consult to Psychiatry for consideration of naltrexone or acamprosate at discharge, and recommendations re: advice on BZD taper, alternatives to Klonopin for anxiety  2. Anxiety:  Patient claims she does not tolerate all SSRIs and does not tolerate Seroquel and has had anaphylaxis to Gabapentin.  Her clonazepam prescriber is Charlott Holler, NP at [facility] per review of [place] controlled substances registry -Defer to recommendations of psychiatry service.   3. Pyuria:  Denies symptoms.  -Monitor for ongoing symptoms, urine culture pending, NGTD.    DVT prophylaxis: Lovenox  Code Status: FULL  Family Communication: Fiance asleep at besdide  Disposition Plan: Anticipate Stepdown monitoring for alcohol withdrawal.   Consults called: Psychiatry Admission status: INPATIENT  Subjective: No seizure activity reported.   Objective: Vitals:   01/18/17 0515 01/18/17 0600 01/18/17 0631 01/18/17 0801  BP: 125/89 118/88  113/86  Pulse: 82 71  63  Resp:    17  Temp:   98.2 F (36.8 C) 98.3 F (36.8 C)  TempSrc:   Oral Oral  SpO2: 93% 96%  95%  Weight:  63.9 kg (140 lb 14.4 oz)    Height:  5' 6"  (1.676 m)       Intake/Output Summary (Last 24 hours) at 01/18/17 0917 Last data filed at 01/18/17 0313  Gross per 24 hour  Intake             1000 ml  Output                0 ml  Net             1000 ml   Filed Weights   01/18/17 0600  Weight: 63.9 kg (140 lb 14.4 oz)    Exam:  General exam: awake, alert NAD cooperative.  Respiratory system:  No increased work of breathing. Cardiovascular system: S1 & S2 heard, RRR. No JVD, murmurs, gallops, clicks or pedal edema. Gastrointestinal system: Abdomen is nondistended, soft and nontender. Normal bowel sounds heard. Central nervous system: Alert and oriented. No focal neurological deficits. Extremities: no CCE.  Data Reviewed: Basic Metabolic Panel:  Recent Labs Lab 01/14/17 2314 01/15/17 0524 01/17/17 0310 01/18/17 0123 01/18/17 0658  NA 139 138 135 137 136  K 3.9 4.1 3.8 4.2 3.8  CL 100* 104 101 103 108  CO2 24 21* 23 23 19*  GLUCOSE 83 65 104* 101* 83  BUN 15 16 17 13 10   CREATININE 0.63 0.63 0.63 0.72 0.47  CALCIUM 8.4* 8.4* 9.5 9.3 8.1*   Liver Function Tests:  Recent Labs Lab 01/14/17 2314 01/17/17 0310 01/18/17 0658  AST 51* 58* 47*  ALT 37 40 37  ALKPHOS 96 74 57  BILITOT 0.4 0.7 0.5  PROT 8.1 7.7 6.1*  ALBUMIN 4.0 3.8 3.3*    Recent Labs Lab 01/14/17  2314  LIPASE 18   No results for input(s): AMMONIA in the last 168 hours. CBC:  Recent Labs Lab 01/14/17 2314 01/15/17 0524 01/17/17 0310 01/18/17 0123 01/18/17 0658  WBC 10.2 9.6 7.6 9.5 7.2  NEUTROABS 5.7  --   --  7.1  --   HGB 15.3* 13.3 13.1 12.9 11.1*  HCT 46.0 41.5 40.9 40.5 35.5*  MCV 86.8 87.6 88.7 90.0 90.3  PLT 414* 336 272 266 218   Cardiac Enzymes: No results for input(s): CKTOTAL, CKMB, CKMBINDEX, TROPONINI in the last 168 hours. CBG (last 3)  No results for input(s): GLUCAP in the last 72 hours. Recent Results (from the past 240 hour(s))  MRSA PCR Screening     Status: None   Collection Time: 01/15/17  3:31 AM  Result Value Ref  Range Status   MRSA by PCR NEGATIVE NEGATIVE Final    Comment:        The GeneXpert MRSA Assay (FDA approved for NASAL specimens only), is one component of a comprehensive MRSA colonization surveillance program. It is not intended to diagnose MRSA infection nor to guide or monitor treatment for MRSA infections.      Studies: No results found.  Scheduled Meds: . enoxaparin (LOVENOX) injection  40 mg Subcutaneous Daily  . [START ON 3/76/2831] folic acid  1 mg Intravenous Daily  . [START ON 01/19/2017] multivitamin with minerals  1 tablet Oral Daily  . ondansetron      . sodium chloride flush  3 mL Intravenous Q12H  . [START ON 01/19/2017] thiamine  100 mg Oral Daily   Continuous Infusions:  Principal Problem:   Alcohol withdrawal (HCC) Active Problems:   Generalized anxiety disorder   Alcoholic hepatitis without ascites   Alcohol dependence with withdrawal with complication [facility])  Critical care Time spent: 35 mins  Irwin Brakeman, MD, FAAFP [facility] Pager [phone]  If 7PM-7AM, please contact night-coverage [email or web address] Password [email or web address] 01/18/2017, 9:17 AM    LOS: 0 days

## 2017-01-18 NOTE — H&P (Signed)
History and Physical  Patient Name: Kristine Macias     LKG:[id number]    DOB: 10/17/1985    DOA: 01/18/2017 PCP: Patient, No Pcp Per   Patient coming from: [facility], where she left AMA  Chief Complaint: Withdrawals, hallucinations  HPI: Kristine Macias is a 31 y.o. female with a past medical history significant for alcohol dependence and frequent withdrawals, withdrawal seizures, and hx of remote spontaneous pneumothoraces who presents with withdrawal.  The patient has been admitted 8 times in 2017 (4 times while pregnant), and 7 times so far and 2018 for alcohol withdrawal.  She has had seizures in the context of alcohol withdrawal, but never been intubated or had delirium requiring sedation (she says she did a few years ago, but this is not supported in medical record).  Since her last hospitalization 4/29-5/1, she was drinking one 1.5L wine bottle per night plus about 5-6 24 oz 8% beers throughout the day to keep away shakes.  She claims she is not taking her clonazepam when drinking.  Three days ago she was admitted after a reported (not witnessed) GTC seizure. EtOH level 248 mg/dL.  She was admitted to stepdown, started on CIWA protocol and developed withdrawal symptoms over the next 24 hours, CIWA scores ranged between 7-19 and were persistently >15 today.  Tonight, her boyfriend in the room reportedly was vaping and nursing staff told him to leave and so the patient demanded to leave AMA.  She tells me she came straight here.  She now feels like she is continuing to withdraw, desires to be sober. She is 72 hours from her last drink.  ED course: -Afebrile, heart rate 116, respirations and pulse ox normal, blood pressure 142/82 -Na 137, K 4.2, Cr 0.72, WBC 9.5K, Hgb 12.9 -AST 58 yesterday -Pregnancy test negative -Alcohol level negative -ECG showed sinus tachycardia -She was given 1 mg of IV lorazepam and TRH were asked to evaluate for continuing alcohol  withdrawal           ROS: Review of Systems  Constitutional: Positive for diaphoresis and malaise/fatigue.  HENT: Positive for tinnitus.   Eyes: Positive for photophobia.  Cardiovascular: Positive for palpitations.  Neurological: Positive for tremors, weakness and headaches.  All other systems reviewed and are negative.         Past Medical History:  Diagnosis Date  . Alcohol abuse 11/2014   drinking since age 7. chronic, recurrent. at least 2 detox admits before 2015.   Marland Kitchen Alcoholic hepatitis 01/6439   hepatic steatosis on 11/2014 ultrasound.   . Breast cancer [facility])    age 18, lump removed from left breast  . Chlamydia 2007   bacterial vaginosis 09/2011  . Colitis 12/2014   colonoscopy for diarrhea and abnml CT 01/06/15: erythmatous TI (path: active ileitis, ? emerging IBD), rectal erythema (path: mucosal prolapse). Random bx of normal colon (path unremarkable)   . Congenital deafness    left ear only.  Also noted in mother and sibling (unilateral).   . Depression with anxiety initally at age 50  . Failure to thrive in adult 12/2014.    Malnutrition: n/v, not eating, weight loss, BMI 14.  s/p 01/11/2015 PEG (Dr Dorna Leitz).   . Hypertensive urgency   . Irregular heart beat 2010   wt/diet related after evaluation  . Pancreas divisum 02/2015   Type 1 seen on CT.   Marland Kitchen Pneumothorax, spontaneous, tension   . Renal disorder   . Seizure ([place]) 06/2015   due  to ETOH/benzo withdrawal.   . Spontaneous pneumothorax 08/2012   right.  chest tube placed.   . Thrombocytopenia ([place]) 04/2007    Past Surgical History:  Procedure Laterality Date  . CESAREAN SECTION  07/2009    x 1.  G3, Para 2012.   . CHEST TUBE INSERTION    . COLONOSCOPY WITH PROPOFOL N/A 01/06/2015   [facility], Dr Rayann Heman. colonoscopy for diarrhea and abnml CT 01/06/15: erythmatous TI (path: active ileitis, ? emerging IBD), rectal erythema (path: mucosal prolapse). Random bx of normal colon (path unremarkable)   .  ESOPHAGOGASTRODUODENOSCOPY N/A 01/06/2015   ;[facility], Dr Rayann Heman. For wt loss, N/V: Normal study, duodenal biopsy/pathology: chronic active duodenitis.   Marland Kitchen ESOPHAGOGASTRODUODENOSCOPY N/A 01/11/2015   Rein-normal with PEG placement  . PEG PLACEMENT N/A 01/11/2015   [facility], Dr Rayann Heman.  for N/V/wt loss/severe malnutrition.     Social History: Patient lives with her fiance, children, mother.  The patient walks unassisted.  Smoker.  Allergies  Allergen Reactions  . Aspirin Shortness Of Breath  . Effexor [Venlafaxine] Anaphylaxis  . Gabapentin Anaphylaxis  . Sulfa Antibiotics Anaphylaxis  . Tetracyclines & Related Anaphylaxis  . Trazodone And Nefazodone Anaphylaxis  . Latex Hives and Itching  . Paxil [Paroxetine] Hives and Itching  . Seroquel [Quetiapine Fumarate] Hives and Itching  . Zoloft [Sertraline Hcl] Hives and Itching    Family history: family history includes Cancer in her father, maternal grandmother, and mother; Crohn's disease in her brother; Stroke in her other; Thyroid disease in her mother.  Prior to Admission medications   Medication Sig Start Date End Date Taking? Authorizing Provider  chlordiazePOXIDE (LIBRIUM) 25 MG capsule Take 2 tablets on 12/25/2016,, take 2 tablets morning and Wednesday evening and 12/26/2016, take 1 tablet on 12/27/2016. Patient not taking: Reported on 01/14/2017 12/25/16   Charlynne Cousins, MD  clonazePAM (KLONOPIN) 0.5 MG tablet Take 0.5 mg by mouth 2 (two) times daily. 12/06/16   [provider]  folic acid (FOLVITE) 1 MG tablet Take 1 tablet (1 mg total) by mouth daily. Patient not taking: Reported on 01/14/2017 08/06/16   Murlean Iba, MD  Melatonin 1 MG CAPS Take 1 mg by mouth at bedtime as needed. sleep    [provider]  ondansetron (ZOFRAN) 4 MG tablet Take 4 mg by mouth every 6 (six) hours as needed for nausea. 10/25/16   [provider]  thiamine 100 MG tablet Take 1 tablet (100 mg total) by mouth daily. 12/14/16    Bonnielee Haff, MD       Physical Exam: BP 116/61   Pulse 95   Temp 98.8 F (37.1 C) (Oral)   Resp 18   LMP 12/20/2016   SpO2 96%  General appearance: Thin adult female, alert and in mild distress from tremor, withdrawals.   Eyes: Anicteric, conjunctiva pink, lids and lashes normal. PERRL.    ENT: No nasal deformity, discharge, epistaxis.  Hearing normal. OP dry without lesions.   Neck: No neck masses.  Trachea midline.  No thyromegaly/tenderness. Lymph: No cervical or supraclavicular lymphadenopathy. Skin: Warm and diaphoretic.  No jaundice.  No suspicious rashes or lesions. Cardiac: Tachcyardic, regular, nl S1-S2, no murmurs appreciated.  Capillary refill is brisk.  JVP normal.  No LE edema.  Radial and DP pulses 2+ and symmetric. Respiratory: Normal respiratory rate and rhythm.  CTAB without rales or wheezes. Abdomen: Abdomen soft.  No TTP. No ascites, distension, hepatosplenomegaly.   MSK: No deformities or effusions.  No cyanosis or clubbing.  Neuro: Cranial nerves 3-12 intact.  Tremor noted, severe.  Sensation intact to light touch. Speech is fluent.  Muscle strength 5-/5, symmetric.       Psych: Sensorium intact and responding to questions, oriented to person, place time, endorses visual hallucinations, but no agitation, oriented to situation, attention mostly normal.  Affect blunted.  Judgment and insight evidently poor.     Labs on Admission:  I have personally reviewed following labs and imaging studies: CBC:  Recent Labs Lab 01/14/17 2314 01/15/17 0524 01/17/17 0310 01/18/17 0123  WBC 10.2 9.6 7.6 9.5  NEUTROABS 5.7  --   --  7.1  HGB 15.3* 13.3 13.1 12.9  HCT 46.0 41.5 40.9 40.5  MCV 86.8 87.6 88.7 90.0  PLT 414* 336 272 681   Basic Metabolic Panel:  Recent Labs Lab 01/14/17 2314 01/15/17 0524 01/17/17 0310 01/18/17 0123  NA 139 138 135 137  K 3.9 4.1 3.8 4.2  CL 100* 104 101 103  CO2 24 21* 23 23  GLUCOSE 83 65 104* 101*  BUN 15 16 17 13    CREATININE 0.63 0.63 0.63 0.72  CALCIUM 8.4* 8.4* 9.5 9.3   GFR: Estimated Creatinine Clearance: 95.4 mL/min (by C-G formula based on SCr of 0.72 mg/dL).  Liver Function Tests:  Recent Labs Lab 01/14/17 2314 01/17/17 0310  AST 51* 58*  ALT 37 40  ALKPHOS 96 74  BILITOT 0.4 0.7  PROT 8.1 7.7  ALBUMIN 4.0 3.8    Recent Labs Lab 01/14/17 2314  LIPASE 18   No results for input(s): AMMONIA in the last 168 hours. Coagulation Profile: No results for input(s): INR, PROTIME in the last 168 hours. Cardiac Enzymes: No results for input(s): CKTOTAL, CKMB, CKMBINDEX, TROPONINI in the last 168 hours. BNP (last 3 results) No results for input(s): PROBNP in the last 8760 hours. HbA1C: No results for input(s): HGBA1C in the last 72 hours. CBG: No results for input(s): GLUCAP in the last 168 hours. Lipid Profile: No results for input(s): CHOL, HDL, LDLCALC, TRIG, CHOLHDL, LDLDIRECT in the last 72 hours. Thyroid Function Tests: No results for input(s): TSH, T4TOTAL, FREET4, T3FREE, THYROIDAB in the last 72 hours. Anemia Panel: No results for input(s): VITAMINB12, FOLATE, FERRITIN, TIBC, IRON, RETICCTPCT in the last 72 hours. Sepsis Labs:  Invalid input(s): PROCALCITONIN, LACTICIDVEN Recent Results (from the past 240 hour(s))  MRSA PCR Screening     Status: None   Collection Time: 01/15/17  3:31 AM  Result Value Ref Range Status   MRSA by PCR NEGATIVE NEGATIVE Final    Comment:        The GeneXpert MRSA Assay (FDA approved for NASAL specimens only), is one component of a comprehensive MRSA colonization surveillance program. It is not intended to diagnose MRSA infection nor to guide or monitor treatment for MRSA infections.           EKG: Independently reviewed. ECG rate 94, QTc 444, sinus tachycardia.    Assessment/Plan  1. Alcohol dependence with withdrawals and seizures:  Last Seizure Monday evening before admission.  Seizures only in the context of alcohol  withdrawal.  Now 3 days from last drink, risk window for DTs, especially in this patient with severe ongoing alcohol abuse, frequent severe withdrawals. -CIWA stepdown unit protocol, frequent scoring -Diphenhydramine for sleep -Consult to Psychiatry for consideration of naltrexone or acamprosate at discharge, and recommendations re: advice on BZD taper, alternatives to Klonopin for anxiety    2. Anxiety:  Patient claims she does not tolerate all  SSRIs and does not tolerate Seroquel and has had anaphylaxis to Gabapentin.  Her clonazepam prescriber is Charlott Holler, NP at [facility] per review of [place] controlled substances registry -I do not recommend she continue clonazepam at discharge  3. Pyuria:  Had pyuria at OSH, denies symptoms.  -Monitor for ongoing symptoms      DVT prophylaxis: Lovenox  Code Status: FULL  Family Communication: Fiance asleep at besdide  Disposition Plan: Anticipate Stepdown monitoring for alcohol withdrawal.  If symptoms begin to  Consults called: Psychiatry Admission status: INPATIENT        Medical decision making: Patient seen at 2:40 AM on 01/18/2017.  The patient was discussed with Dr. Leonides Schanz.  What exists of the patient's chart was reviewed in depth and summarized above.  Clinical condition: tachycardic, now 3 days from last drink, entering period of highest likelihood of delirium.        Edwin Dada [facility] Pager [phone]

## 2017-01-18 NOTE — Progress Notes (Signed)
This RN is taking over care of the patient and agrees with the previous RN's assessment.

## 2017-01-19 DIAGNOSIS — F17210 Nicotine dependence, cigarettes, uncomplicated: Secondary | ICD-10-CM

## 2017-01-19 DIAGNOSIS — Z765 Malingerer [conscious simulation]: Secondary | ICD-10-CM

## 2017-01-19 LAB — COMPREHENSIVE METABOLIC PANEL
ALBUMIN: 3.6 g/dL (ref 3.5–5.0)
ALT: 49 U/L (ref 14–54)
AST: 58 U/L — AB (ref 15–41)
Alkaline Phosphatase: 60 U/L (ref 38–126)
Anion gap: 8 (ref 5–15)
BILIRUBIN TOTAL: 0.5 mg/dL (ref 0.3–1.2)
BUN: 8 mg/dL (ref 6–20)
CHLORIDE: 104 mmol/L (ref 101–111)
CO2: 24 mmol/L (ref 22–32)
Calcium: 9.4 mg/dL (ref 8.9–10.3)
Creatinine, Ser: 0.6 mg/dL (ref 0.44–1.00)
GFR calc Af Amer: >60 mL/min (ref >60)
GFR calc non Af Amer: >60 mL/min (ref >60)
GLUCOSE: 136 mg/dL — AB (ref 65–99)
POTASSIUM: 3.4 mmol/L — AB (ref 3.5–5.1)
Sodium: 136 mmol/L (ref 135–145)
Total Protein: 7 g/dL (ref 6.5–8.1)

## 2017-01-19 LAB — CBC WITH DIFFERENTIAL/PLATELET
BASOS PCT: 0 %
Basophils Absolute: 0 10*3/uL (ref 0.0–0.1)
EOS ABS: 0.1 10*3/uL (ref 0.0–0.7)
EOS PCT: 2 %
HEMATOCRIT: 38.9 % (ref 36.0–46.0)
Hemoglobin: 12 g/dL (ref 12.0–15.0)
Lymphocytes Relative: 29 %
Lymphs Abs: 2.2 10*3/uL (ref 0.7–4.0)
MCH: 28 pg (ref 26.0–34.0)
MCHC: 30.8 g/dL (ref 30.0–36.0)
MCV: 90.7 fL (ref 78.0–100.0)
MONO ABS: 0.4 10*3/uL (ref 0.1–1.0)
MONOS PCT: 5 %
NEUTROS ABS: 5 10*3/uL (ref 1.7–7.7)
Neutrophils Relative %: 64 %
PLATELETS: 230 10*3/uL (ref 150–400)
RBC: 4.29 MIL/uL (ref 3.87–5.11)
RDW: 15 % (ref 11.5–15.5)
WBC: 7.7 10*3/uL (ref 4.0–10.5)

## 2017-01-19 MED ORDER — FOLIC ACID 1 MG PO TABS
1.0000 mg | ORAL_TABLET | Freq: Every day | ORAL | Status: DC
  Administered 2017-01-20: 1 mg via ORAL
  Filled 2017-01-19: qty 1

## 2017-01-19 MED ORDER — POTASSIUM CHLORIDE CRYS ER 20 MEQ PO TBCR
40.0000 meq | EXTENDED_RELEASE_TABLET | Freq: Once | ORAL | Status: AC
  Administered 2017-01-19: 40 meq via ORAL
  Filled 2017-01-19: qty 2

## 2017-01-19 NOTE — Progress Notes (Signed)
PROGRESS NOTE    Kristine Macias  RXV:[id number]  DOB: August 02, 1986  DOA: 01/18/2017 PCP: Patient, No Pcp Per Outpatient Specialists:   Hospital course: Kristine Macias is a 31 y.o. female with a past medical history significant for alcohol dependence and frequent withdrawals, withdrawal seizures, and hx of remote spontaneous pneumothoraces who presents with withdrawal.  Assessment & Plan:   1. Alcohol dependence with withdrawals and seizures:  Last Seizure Monday evening before admission.  Seizures only in the context of alcohol withdrawal.  Now suspect this is 5 days from last drink, risk window for DTs, especially in this patient with severe ongoing alcohol abuse, frequent severe withdrawals. -CIWA stepdown unit protocol, frequent scoring as CIWA numbers still high -Diphenhydramine for sleep -Consult to Psychiatry for consideration of naltrexone or acamprosate at discharge, and recommendations re: advice on BZD taper, alternatives to Klonopin for anxiety. Consult to Education officer, museum for assistance with treatment options.   2. Anxiety:  Patient claims she does not tolerate all SSRIs and does not tolerate Seroquel and has had anaphylaxis to Gabapentin.  Her clonazepam prescriber is Charlott Holler, NP at [facility] per review of [place] controlled substances registry -Defer to recommendations of psychiatry service.   3. Pyuria:  Denies symptoms.  -Monitor for ongoing symptoms, urine culture with multiple species, suggest recollection, will try to get another collection today.    DVT prophylaxis: Lovenox  Code Status: FULL  Family Communication: Fiance asleep at besdide  Disposition Plan: Anticipate Stepdown monitoring for alcohol withdrawal.   Consults called: Psychiatry Admission status: INPATIENT  Subjective: No seizure activity reported, No complaints.    Objective: Vitals:   01/19/17 0000 01/19/17 0400 01/19/17 0600 01/19/17 0838  BP:    106/70  Pulse: (!) 105 77  75 73  Resp: 17 19  16   Temp:  98.1 F (36.7 C)  98.2 F (36.8 C)  TempSrc:  Oral  Oral  SpO2: 96% 93%  93%  Weight:      Height:       No intake or output data in the 24 hours ending 01/19/17 0858 Filed Weights   01/18/17 0600  Weight: 63.9 kg (140 lb 14.4 oz)    Exam:  General exam: awake, alert NAD cooperative.  Respiratory system:  No increased work of breathing. Cardiovascular system: S1 & S2 heard, RRR. No JVD, murmurs, gallops, clicks or pedal edema. Gastrointestinal system: Abdomen is nondistended, soft and nontender. Normal bowel sounds heard. Central nervous system: Alert and oriented. No focal neurological deficits. Extremities: no CCE.  Data Reviewed: Basic Metabolic Panel:  Recent Labs Lab 01/15/17 0524 01/17/17 0310 01/18/17 0123 01/18/17 0658 01/19/17 0256  NA 138 135 137 136 136  K 4.1 3.8 4.2 3.8 3.4*  CL 104 101 103 108 104  CO2 21* 23 23 19* 24  GLUCOSE 65 104* 101* 83 136*  BUN 16 17 13 10 8   CREATININE 0.63 0.63 0.72 0.47 0.60  CALCIUM 8.4* 9.5 9.3 8.1* 9.4   Liver Function Tests:  Recent Labs Lab 01/14/17 2314 01/17/17 0310 01/18/17 0658 01/19/17 0256  AST 51* 58* 47* 58*  ALT 37 40 37 49  ALKPHOS 96 74 57 60  BILITOT 0.4 0.7 0.5 0.5  PROT 8.1 7.7 6.1* 7.0  ALBUMIN 4.0 3.8 3.3* 3.6    Recent Labs Lab 01/14/17 2314  LIPASE 18   No results for input(s): AMMONIA in the last 168 hours. CBC:  Recent Labs Lab 01/14/17 2314 01/15/17 5093 01/17/17 0310 01/18/17 0123  01/18/17 0658 01/19/17 0256  WBC 10.2 9.6 7.6 9.5 7.2 7.7  NEUTROABS 5.7  --   --  7.1  --  5.0  HGB 15.3* 13.3 13.1 12.9 11.1* 12.0  HCT 46.0 41.5 40.9 40.5 35.5* 38.9  MCV 86.8 87.6 88.7 90.0 90.3 90.7  PLT 414* 336 272 266 218 230   Cardiac Enzymes: No results for input(s): CKTOTAL, CKMB, CKMBINDEX, TROPONINI in the last 168 hours. CBG (last 3)  No results for input(s): GLUCAP in the last 72 hours. Recent Results (from the past 240 hour(s))  MRSA PCR  Screening     Status: None   Collection Time: 01/15/17  3:31 AM  Result Value Ref Range Status   MRSA by PCR NEGATIVE NEGATIVE Final    Comment:        The GeneXpert MRSA Assay (FDA approved for NASAL specimens only), is one component of a comprehensive MRSA colonization surveillance program. It is not intended to diagnose MRSA infection nor to guide or monitor treatment for MRSA infections.   Culture, Urine     Status: Abnormal   Collection Time: 01/16/17  7:14 PM  Result Value Ref Range Status   Specimen Description URINE, CLEAN CATCH  Final   Special Requests NONE  Final   Culture MULTIPLE SPECIES PRESENT, SUGGEST RECOLLECTION (A)  Final   Report Status 01/18/2017 FINAL  Final  MRSA PCR Screening     Status: None   Collection Time: 01/18/17  7:26 AM  Result Value Ref Range Status   MRSA by PCR NEGATIVE NEGATIVE Final    Comment:        The GeneXpert MRSA Assay (FDA approved for NASAL specimens only), is one component of a comprehensive MRSA colonization surveillance program. It is not intended to diagnose MRSA infection nor to guide or monitor treatment for MRSA infections.      Studies: No results found.  Scheduled Meds: . enoxaparin (LOVENOX) injection  40 mg Subcutaneous Daily  . folic acid  1 mg Intravenous Daily  . multivitamin with minerals  1 tablet Oral Daily  . potassium chloride  40 mEq Oral Once  . sodium chloride flush  3 mL Intravenous Q12H  . thiamine  100 mg Oral Daily   Continuous Infusions:  Principal Problem:   Alcohol withdrawal (HCC) Active Problems:   Generalized anxiety disorder   Alcoholic hepatitis without ascites   Alcohol dependence with withdrawal with complication [facility])  Critical care Time spent: 31 mins  Irwin Brakeman, MD, FAAFP [facility] Pager [phone]  If 7PM-7AM, please contact night-coverage [email or web address] Password [email or web address] 01/19/2017, 8:58 AM    LOS: 1 day
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
## 2017-01-20 DIAGNOSIS — K7010 Alcoholic hepatitis without ascites: Secondary | ICD-10-CM

## 2017-01-20 DIAGNOSIS — F10239 Alcohol dependence with withdrawal, unspecified: Principal | ICD-10-CM

## 2017-01-20 DIAGNOSIS — F411 Generalized anxiety disorder: Secondary | ICD-10-CM

## 2017-01-20 LAB — TSH: TSH: 2.216 u[IU]/mL (ref 0.350–4.500)

## 2017-01-20 LAB — BASIC METABOLIC PANEL
Anion gap: 9 (ref 5–15)
BUN: 9 mg/dL (ref 6–20)
CHLORIDE: 102 mmol/L (ref 101–111)
CO2: 23 mmol/L (ref 22–32)
CREATININE: 0.65 mg/dL (ref 0.44–1.00)
Calcium: 9.5 mg/dL (ref 8.9–10.3)
Glucose, Bld: 88 mg/dL (ref 65–99)
POTASSIUM: 3.5 mmol/L (ref 3.5–5.1)
SODIUM: 134 mmol/L — AB (ref 135–145)

## 2017-01-20 MED ORDER — LORAZEPAM 0.5 MG PO TABS: ORAL_TABLET | ORAL | 0 refills | Status: DC

## 2017-01-20 MED ORDER — BUSPIRONE HCL 5 MG PO TABS: 5.0000 mg | ORAL_TABLET | Freq: Three times a day (TID) | ORAL | 0 refills | Status: AC

## 2017-01-20 MED ORDER — ADULT MULTIVITAMIN W/MINERALS CH
1.0000 | ORAL_TABLET | Freq: Every day | ORAL | Status: DC
Start: 2017-01-21 — End: 2017-03-04

## 2017-01-20 NOTE — Progress Notes (Signed)
During am assessment pt showed tremors and discussed concern about ativan taper on dc. Discussed with Dr Wynetta Emery.

## 2017-01-20 NOTE — Discharge Summary (Signed)
Physician Discharge Summary  Kristine Macias ASN:[id number] DOB: [birth year] DOA: 01/18/2017  PCP: Leonides Sake, MD  Admit date: 01/18/2017 Discharge date: 01/20/2017  Admitted From: Home  Disposition: Home   Recommendations for Outpatient Follow-up:  1. Follow up with PCP in 1 weeks 2. Follow up with IOP at [facility] for further treatment.  3. Go to AA treatment 4. Consider CBT therapy enrollment 5. Consider trial of naltrexone or acamprosate 6. Please obtain BMP/CBC in one week  Discharge Condition: STABLE  CODE STATUS: FULL  Brief/Interim Summary: HPI: Kristine Macias is a 31 y.o. female with a past medical history significant for alcohol dependence and frequent withdrawals, withdrawal seizures, and hx of remote spontaneous pneumothoraces who presents with withdrawal.  The patient has been admitted 8 times in 2017 (4 times while pregnant), and 7 times so far and 2018 for alcohol withdrawal.  She has had seizures in the context of alcohol withdrawal, but never been intubated or had delirium requiring sedation (she says she did a few years ago, but this is not supported in medical record).  Since her last hospitalization 4/29-5/1, she was drinking one 1.5L wine bottle per night plus about 5-6 24 oz 8% beers throughout the day to keep away shakes.  She claims she is not taking her clonazepam when drinking.  Three days ago she was admitted after a reported (not witnessed) GTC seizure. EtOH level 248 mg/dL.  She was admitted to stepdown, started on CIWA protocol and developed withdrawal symptoms over the next 24 hours, CIWA scores ranged between 7-19 and were persistently >15 today.  Tonight, her boyfriend in the room reportedly was vaping and nursing staff told him to leave and so the patient demanded to leave AMA.  She tells me she came straight here.  She now feels like she is continuing to withdraw, desires to be sober. She is 72 hours from her last drink.  ED  course: -Afebrile, heart rate 116, respirations and pulse ox normal, blood pressure 142/82 -Na 137, K 4.2, Cr 0.72, WBC 9.5K, Hgb 12.9 -AST 58 yesterday -Pregnancy test negative -Alcohol level negative -ECG showed sinus tachycardia -She was given 1 mg of IV lorazepam and TRH were asked to evaluate for continuing alcohol withdrawal  Hospital course: Anaia Frith Johnstonis a 31 y.o.femalewith a past medical history significant for alcohol dependence and frequent withdrawals, withdrawal seizures, and hx of remote spontaneous pneumothoraceswho presents with withdrawal.  Assessment & Plan:   1. Alcohol dependence with withdrawals and seizures: Last Seizure Monday evening before admission. Seizures only in the context of alcohol withdrawal. Now suspect this is 5 days from last drink, risk window for DTs, especially in this patient with severe ongoing alcohol abuse, frequent severe withdrawals. -Pt not in active withdrawal anymore, she has been stable last 2 days, CIWA numbers have come down to 6 or less, she was seen by psychiatry.  She is stable for discharge home.   -Diphenhydramine for sleep -Consult to Psychiatry and they recommended consideration of naltrexone or acamprosate on outpatient treatment see consult note.   2. Anxiety: Patient claims she does not tolerate all SSRIs and does not tolerate Seroquel and has had anaphylaxis to Gabapentin. Her clonazepam prescriber is Charlott Holler, NP at [facility] per review of [place] controlled substances registry -Gave short taper of lorazepam at discharge per psychiatry recommendations.  Pt to follow up outpatient at [facility]. Also Rx given to try buspirone for chronic treatment of anxiety disorder.    Discharge  Diagnoses:  Principal Problem:   Alcohol withdrawal ([place]) Active Problems:   Generalized anxiety disorder   Alcoholic hepatitis without ascites   Alcohol dependence with withdrawal with complication [facility])  Discharge  Instructions  Discharge Instructions    Increase activity slowly    Complete by:  As directed      Allergies as of 01/20/2017      Reactions   Aspirin Shortness Of Breath   Effexor [venlafaxine] Anaphylaxis   Gabapentin Anaphylaxis   Sulfa Antibiotics Anaphylaxis   Tetracyclines & Related Anaphylaxis   Trazodone And Nefazodone Anaphylaxis   Latex Hives, Itching   Paxil [paroxetine] Hives, Itching   Seroquel [quetiapine Fumarate] Hives, Itching   Zoloft [sertraline Hcl] Hives, Itching      Medication List    STOP taking these medications   chlordiazePOXIDE 25 MG capsule Commonly known as:  LIBRIUM   clonazePAM 0.5 MG tablet Commonly known as:  KLONOPIN     TAKE these medications   busPIRone 5 MG tablet Commonly known as:  BUSPAR Take 1 tablet (5 mg total) by mouth 3 (three) times daily.   folic acid 481 MCG tablet Commonly known as:  FOLVITE Take 400 mcg by mouth daily.   LORazepam 0.5 MG tablet Commonly known as:  ATIVAN Take 1 po BID x 3 days then 1 po daily x 3 days   Melatonin 1 MG Caps Take 1 mg by mouth at bedtime as needed. sleep   multivitamin with minerals Tabs tablet Take 1 tablet by mouth daily. Start taking on:  01/21/2017   ondansetron 4 MG tablet Commonly known as:  ZOFRAN Take 4 mg by mouth every 6 (six) hours as needed for nausea.   thiamine 100 MG tablet Take 1 tablet (100 mg total) by mouth daily.      Follow-up Information    Hamrick, Lorin Mercy, MD. Schedule an appointment as soon as possible for a visit in 1 week(s).   Specialty:  Family Medicine Why:  Hospital Follow Up  Contact information: [place] [address] [phone]          Allergies  Allergen Reactions  . Aspirin Shortness Of Breath  . Effexor [Venlafaxine] Anaphylaxis  . Gabapentin Anaphylaxis  . Sulfa Antibiotics Anaphylaxis  . Tetracyclines & Related Anaphylaxis  . Trazodone And Nefazodone Anaphylaxis  . Latex Hives and Itching  .  Paxil [Paroxetine] Hives and Itching  . Seroquel [Quetiapine Fumarate] Hives and Itching  . Zoloft [Sertraline Hcl] Hives and Itching    Procedures/Studies: Ct Head Wo Contrast  Result Date: 01/15/2017 CLINICAL DATA:  Two seizures on 01/14/2017, possibly due to alcohol withdrawal. EXAM: CT HEAD WITHOUT CONTRAST TECHNIQUE: Contiguous axial images were obtained from the base of the skull through the vertex without intravenous contrast. COMPARISON:  MRI brain 11/12/2016.  CT head 09/21/2016 FINDINGS: Brain: No evidence of acute infarction, hemorrhage, hydrocephalus, extra-axial collection or mass lesion/mass effect. Vascular: No hyperdense vessel or unexpected calcification. Skull: Normal. Negative for fracture or focal lesion. Sinuses/Orbits: No acute finding. Other: None. IMPRESSION: No acute intracranial abnormalities. Electronically Signed   By: Lucienne Capers M.D.   On: 01/15/2017 02:58   (Echo, Carotid, EGD, Colonoscopy, ERCP)    Subjective: Pt without complaints.  Ready for discharge.   Discharge Exam: Vitals:   01/20/17 0913 01/20/17 1313  BP: 110/75 100/64  Pulse: 92 90  Resp: (!) 21 19  Temp: 97.7 F (36.5 C) 97.7 F (36.5 C)   Vitals:   01/20/17 0400  01/20/17 0500 01/20/17 0913 01/20/17 1313  BP:  115/81 110/75 100/64  Pulse:   92 90  Resp: 19  (!) 21 19  Temp:   97.7 F (36.5 C) 97.7 F (36.5 C)  TempSrc:   Oral Oral  SpO2:   95% 95%  Weight:      Height:        General: Pt is alert, awake, not in acute distress Cardiovascular: RRR, S1/S2 +, no rubs, no gallops Respiratory: CTA bilaterally, no wheezing, no rhonchi Abdominal: Soft, NT, ND, bowel sounds + Extremities: no edema, no cyanosis  The results of significant diagnostics from this hospitalization (including imaging, microbiology, ancillary and laboratory) are listed below for reference.     Microbiology: Recent Results (from the past 240 hour(s))  MRSA PCR Screening     Status: None   Collection  Time: 01/15/17  3:31 AM  Result Value Ref Range Status   MRSA by PCR NEGATIVE NEGATIVE Final    Comment:        The GeneXpert MRSA Assay (FDA approved for NASAL specimens only), is one component of a comprehensive MRSA colonization surveillance program. It is not intended to diagnose MRSA infection nor to guide or monitor treatment for MRSA infections.   Culture, Urine     Status: Abnormal   Collection Time: 01/16/17  7:14 PM  Result Value Ref Range Status   Specimen Description URINE, CLEAN CATCH  Final   Special Requests NONE  Final   Culture MULTIPLE SPECIES PRESENT, SUGGEST RECOLLECTION (A)  Final   Report Status 01/18/2017 FINAL  Final  MRSA PCR Screening     Status: None   Collection Time: 01/18/17  7:26 AM  Result Value Ref Range Status   MRSA by PCR NEGATIVE NEGATIVE Final    Comment:        The GeneXpert MRSA Assay (FDA approved for NASAL specimens only), is one component of a comprehensive MRSA colonization surveillance program. It is not intended to diagnose MRSA infection nor to guide or monitor treatment for MRSA infections.      Labs: BNP (last 3 results) No results for input(s): BNP in the last 8760 hours. Basic Metabolic Panel:  Recent Labs Lab 01/17/17 0310 01/18/17 0123 01/18/17 0658 01/19/17 0256 01/20/17 0232  NA 135 137 136 136 134*  K 3.8 4.2 3.8 3.4* 3.5  CL 101 103 108 104 102  CO2 23 23 19* 24 23  GLUCOSE 104* 101* 83 136* 88  BUN 17 13 10 8 9   CREATININE 0.63 0.72 0.47 0.60 0.65  CALCIUM 9.5 9.3 8.1* 9.4 9.5   Liver Function Tests:  Recent Labs Lab 01/14/17 2314 01/17/17 0310 01/18/17 0658 01/19/17 0256  AST 51* 58* 47* 58*  ALT 37 40 37 49  ALKPHOS 96 74 57 60  BILITOT 0.4 0.7 0.5 0.5  PROT 8.1 7.7 6.1* 7.0  ALBUMIN 4.0 3.8 3.3* 3.6    Recent Labs Lab 01/14/17 2314  LIPASE 18   No results for input(s): AMMONIA in the last 168 hours. CBC:  Recent Labs Lab 01/14/17 2314 01/15/17 0524 01/17/17 0310  01/18/17 0123 01/18/17 0658 01/19/17 0256  WBC 10.2 9.6 7.6 9.5 7.2 7.7  NEUTROABS 5.7  --   --  7.1  --  5.0  HGB 15.3* 13.3 13.1 12.9 11.1* 12.0  HCT 46.0 41.5 40.9 40.5 35.5* 38.9  MCV 86.8 87.6 88.7 90.0 90.3 90.7  PLT 414* 336 272 266 218 230   Cardiac Enzymes: No results for  input(s): CKTOTAL, CKMB, CKMBINDEX, TROPONINI in the last 168 hours. BNP: Invalid input(s): POCBNP CBG: No results for input(s): GLUCAP in the last 168 hours. D-Dimer No results for input(s): DDIMER in the last 72 hours. Hgb A1c No results for input(s): HGBA1C in the last 72 hours. Lipid Profile No results for input(s): CHOL, HDL, LDLCALC, TRIG, CHOLHDL, LDLDIRECT in the last 72 hours. Thyroid function studies  Recent Labs  01/20/17 0232  TSH 2.216   Anemia work up No results for input(s): VITAMINB12, FOLATE, FERRITIN, TIBC, IRON, RETICCTPCT in the last 72 hours. Urinalysis    Component Value Date/Time   COLORURINE YELLOW 01/14/2017 2245   APPEARANCEUR CLEAR 01/14/2017 2245   APPEARANCEUR Clear 02/15/2016 1456   LABSPEC 1.011 01/14/2017 2245   LABSPEC 1.006 12/20/2014 1018   PHURINE 6.0 01/14/2017 2245   GLUCOSEU NEGATIVE 01/14/2017 2245   GLUCOSEU Negative 12/20/2014 1018   HGBUR LARGE (A) 01/14/2017 2245   BILIRUBINUR NEGATIVE 01/14/2017 2245   BILIRUBINUR neg 03/13/2016 1700   BILIRUBINUR Negative 02/15/2016 1456   BILIRUBINUR Negative 12/20/2014 1018   KETONESUR NEGATIVE 01/14/2017 2245   PROTEINUR 30 (A) 01/14/2017 2245   UROBILINOGEN negative 03/13/2016 1700   UROBILINOGEN 0.2 06/13/2012 2306   NITRITE NEGATIVE 01/14/2017 2245   LEUKOCYTESUR TRACE (A) 01/14/2017 2245   LEUKOCYTESUR Negative 02/15/2016 1456   LEUKOCYTESUR Negative 12/20/2014 1018   Sepsis Labs Invalid input(s): PROCALCITONIN,  WBC,  LACTICIDVEN Microbiology Recent Results (from the past 240 hour(s))  MRSA PCR Screening     Status: None   Collection Time: 01/15/17  3:31 AM  Result Value Ref Range Status    MRSA by PCR NEGATIVE NEGATIVE Final    Comment:        The GeneXpert MRSA Assay (FDA approved for NASAL specimens only), is one component of a comprehensive MRSA colonization surveillance program. It is not intended to diagnose MRSA infection nor to guide or monitor treatment for MRSA infections.   Culture, Urine     Status: Abnormal   Collection Time: 01/16/17  7:14 PM  Result Value Ref Range Status   Specimen Description URINE, CLEAN CATCH  Final   Special Requests NONE  Final   Culture MULTIPLE SPECIES PRESENT, SUGGEST RECOLLECTION (A)  Final   Report Status 01/18/2017 FINAL  Final  MRSA PCR Screening     Status: None   Collection Time: 01/18/17  7:26 AM  Result Value Ref Range Status   MRSA by PCR NEGATIVE NEGATIVE Final    Comment:        The GeneXpert MRSA Assay (FDA approved for NASAL specimens only), is one component of a comprehensive MRSA colonization surveillance program. It is not intended to diagnose MRSA infection nor to guide or monitor treatment for MRSA infections.    Time coordinating discharge: 32 minutes  SIGNED:  Irwin Brakeman, MD  [facility] 01/20/2017, 2:14 PM Pager [phone]  If 7PM-7AM, please contact night-coverage [email or web address] Password [email or web address]

## 2017-01-20 NOTE — Progress Notes (Signed)
DC teaching completed and all questions answered. MD in again during teaching to reiterate need to see outpatient treatment and discuss new medications. IV removed. Pt not tremulous nor confused. Husband present to take home. Scripts provided.

## 2017-01-20 NOTE — Discharge Instructions (Signed)
contact IOP at [facility]  Alcohol Use Disorder Alcohol use disorder is when your drinking disrupts your daily life. When you have this condition, you drink too much alcohol and you cannot control your drinking. Alcohol use disorder can cause serious problems with your physical health. It can affect your brain, heart, liver, pancreas, immune system, stomach, and intestines. Alcohol use disorder can increase your risk for certain cancers and cause problems with your mental health, such as depression, anxiety, psychosis, delirium, and dementia. People with this disorder risk hurting themselves and others. What are the causes? This condition is caused by drinking too much alcohol over time. It is not caused by drinking too much alcohol only one or two times. Some people with this condition drink alcohol to cope with or escape from negative life events. Others drink to relieve pain or symptoms of mental illness. What increases the risk? You are more likely to develop this condition if:  You have a family history of alcohol use disorder.  Your culture encourages drinking to the point of intoxication, or makes alcohol easy to get.  You had a mood or conduct disorder in childhood.  You have been a victim of abuse.  You are an adolescent and:  You have poor grades or difficulties in school.  Your caregivers do not talk to you about saying no to alcohol, or supervise your activities.  You are impulsive or you have trouble with self-control. What are the signs or symptoms? Symptoms of this condition include:  Drinkingmore than you want to.  Drinking for longer than you want to.  Trying several times to drink less or to control your drinking.  Spending a lot of time getting alcohol, drinking, or recovering from drinking.  Craving alcohol.  Having problems at work, at school, or at home due to drinking.  Having problems in relationships due to drinking.  Drinking when it  is dangerous to drink, such as before driving a car.  Continuing to drink even though you know you might have a physical or mental problem related to drinking.  Needing more and more alcohol to get the same effect you want from the alcohol (building up tolerance).  Having symptoms of withdrawal when you stop drinking. Symptoms of withdrawal include:  Fatigue.  Nightmares.  Trouble sleeping.  Depression.  Anxiety.  Fever.  Seizures.  Severe confusion.  Feeling or seeing things that are not there (hallucinations).  Tremors.  Rapid heart rate.  Rapid breathing.  High blood pressure.  Drinking to avoid symptoms of withdrawal. How is this diagnosed? This condition is diagnosed with an assessment. Your health care provider may start the assessment by asking three or four questions about your drinking. Your health care provider may perform a physical exam or do lab tests to see if you have physical problems resulting from alcohol use. She or he may refer you to a mental health professional for evaluation. How is this treated? Some people with alcohol use disorder are able to reduce their alcohol use to low-risk levels. Others need to completely quit drinking alcohol. When necessary, mental health professionals with specialized training in substance use treatment can help. Your health care provider can help you decide how severe your alcohol use disorder is and what type of treatment you need. The following forms of treatment are available:  Detoxification. Detoxification involves quitting drinking and using prescription medicines within the first week to help lessen withdrawal symptoms. This treatment is important for people who have had  withdrawal symptoms before and for heavy drinkers who are likely to have withdrawal symptoms. Alcohol withdrawal can be dangerous, and in severe cases, it can cause death. Detoxification may be provided in a home, community, or primary care setting,  or in a hospital or substance use treatment facility.  Counseling. This treatment is also called talk therapy. It is provided by substance use treatment counselors. A counselor can address the reasons you use alcohol and suggest ways to keep you from drinking again or to prevent problem drinking. The goals of talk therapy are to:  Find healthy activities and ways for you to cope with stress.  Identify and avoid the things that trigger your alcohol use.  Help you learn how to handle cravings.  Medicines.Medicines can help treat alcohol use disorder by:  Decreasing alcohol cravings.  Decreasing the positive feeling you have when you drink alcohol.  Causing an uncomfortable physical reaction when you drink alcohol (aversion therapy).  Support groups. Support groups are led by people who have quit drinking. They provide emotional support, advice, and guidance. These forms of treatment are often combined. Some people with this condition benefit from a combination of treatments provided by specialized substance use treatment centers. Follow these instructions at home:  Take over-the-counter and prescription medicines only as told by your health care provider.  Check with your health care provider before starting any new medicines.  Ask friends and family members not to offer you alcohol.  Avoid situations where alcohol is served, including gatherings where others are drinking alcohol.  Create a plan for what to do when you are tempted to use alcohol.  Find hobbies or activities that you enjoy that do not include alcohol.  Keep all follow-up visits as told by your health care provider. This is important. How is this prevented?  If you drink, limit alcohol intake to no more than 1 drink a day for nonpregnant women and 2 drinks a day for men. One drink equals 12 oz of beer, 5 oz of wine, or 1 oz of hard liquor.  If you have a mental health condition, get treatment and support.  Do  not give alcohol to adolescents.  If you are an adolescent:  Do not drink alcohol.  Do not be afraid to say no if someone offers you alcohol. Speak up about why you do not want to drink. You can be a positive role model for your friends and set a good example for those around you by not drinking alcohol.  If your friends drink, spend time with others who do not drink alcohol. Make new friends who do not use alcohol.  Find healthy ways to manage stress and emotions, such as meditation or deep breathing, exercise, spending time in nature, listening to music, or talking with a trusted friend or family member. Contact a health care provider if:  You are not able to take your medicines as told.  Your symptoms get worse.  You return to drinking alcohol (relapse) and your symptoms get worse. Get help right away if:  You have thoughts about hurting yourself or others. If you ever feel like you may hurt yourself or others, or have thoughts about taking your own life, get help right away. You can go to your nearest emergency department or call:  Your local emergency services (911 in the [place]  A suicide crisis helpline, such as the [place] at (680) 771-9216. This is open 24 hours a day. Summary  Alcohol use disorder  is when your drinking disrupts your daily life. When you have this condition, you drink too much alcohol and you cannot control your drinking.  Treatment may include detoxification, counseling, medicine, and support groups.  Ask friends and family members not to offer you alcohol. Avoid situations where alcohol is served.  Get help right away if you have thoughts about hurting yourself or others. This information is not intended to replace advice given to you by your health care provider. Make sure you discuss any questions you have with your health care provider. Document Released: 09/20/2004 Document Revised: 05/10/2016 Document Reviewed:  05/10/2016 Elsevier Interactive Patient Education  2017 [employer].   Alcohol Withdrawal When a person who drinks a lot of alcohol stops drinking, he or she may go through alcohol withdrawal. Alcohol withdrawal causes problems. It can make you feel:  Tired (fatigued).  Sad (depressed).  Fearful (anxious).  Grouchy (irritable).  Not hungry.  Sick to your stomach (nauseous).  Shaky. It can also make you have:  Nightmares.  Trouble sleeping.  Trouble thinking clearly.  Mood swings.  Clammy skin.  Very bad sweating.  A very fast heartbeat.  Shaking that you cannot control (tremor).  Having a fever.  A fit of movements that you cannot control (seizure).  Confusion.  Throwing up (vomiting).  Feeling or seeing things that are not there (hallucinations). Follow these instructions at home:  Take medicines and vitamins only as told by your doctor.  Do not drink alcohol.  Have someone around in case you need help.  Drink enough fluid to keep your pee (urine) clear or pale yellow.  Think about joining a group to help you stop drinking. Contact a doctor if:  Your problems get worse.  Your problems do not go away.  You cannot eat or drink without throwing up.  You are having a hard time not drinking alcohol.  You cannot stop drinking alcohol. Get help right away if:  You feel your heart beating differently than usual.  Your chest hurts.  You have trouble breathing.  You have very bad problems, like:  A fever.  A fit of movements that you cannot control.  Being very confused.  Feeling or seeing things that are not there. This information is not intended to replace advice given to you by your health care provider. Make sure you discuss any questions you have with your health care provider. Document Released: 01/30/2008 Document Revised: 01/19/2016 Document Reviewed: 06/01/2014 Elsevier Interactive Patient Education  2017 [place].   Alcohol Withdrawal Alcohol withdrawal is a group of symptoms that can develop when a person who drinks heavily and regularly stops drinking or drinks less. What are the causes? Heavy and regular drinking can cause chemicals that send signals from the brain to the body (neurotransmitters) to deactivate. Alcohol withdrawal develops when deactivated neurotransmitters reactivate because a person stops drinking or drinks less. What increases the risk? The more a person drinks and the longer he or she drinks, the greater the risk of alcohol withdrawal. Severe withdrawal is more likely to develop in someone who:  Had severe alcohol withdrawal in the past.  Had a seizure during a previous episode of alcohol withdrawal.  Is elderly.  Is pregnant.  Has been abusing drugs.  Has other medical problems, including:  Infection.  Heart, lung, or liver disease.  Seizures.  Mental health problems. What are the signs or symptoms? Symptoms of this condition can be mild to moderate, or they can be severe. Mild  to moderate symptoms may include:  Fatigue.  Nightmares.  Trouble sleeping.  Depression.  Anxiety.  Inability to think clearly.  Mood swings.  Irritability.  Loss of appetite.  Nausea or vomiting.  Clammy skin.  Extreme sweating.  Rapid heartbeat.  Shakiness.  Uncontrollable shaking (tremor). Severe symptoms may include:  Fever.  Seizures.  Severeconfusion.  Feeling or seeing things that are not there (hallucinations). Symptoms usually begin within eight hours after a person stops drinking or drinks less. They can last for weeks. How is this diagnosed? Alcohol withdrawal is diagnosed with a medical history and physical exam. Sometimes, urine and blood tests are also done. How is this treated? Treatment may involve:  Monitoring blood pressure, pulse, and breathing.  Getting fluids through an IV tube.  Medicine to reduce anxiety.  Medicine to  prevent or control seizures.  Multivitamins and B vitamins.  Having a health care provider check on you daily. If symptoms are moderate to severe or if there is a risk of severe withdrawal, treatment may be done at a hospital or treatment center. Follow these instructions at home:  Take medicines and vitamin supplements only as directed by your health care provider.  Do not drink alcohol.  Have someone stay with you or be available if you need help.  Drink enough fluid to keep your urine clear or pale yellow.  Consider joining a 12-step program or another alcohol support group. Contact a health care provider if:  Your symptoms get worse or do not go away.  You cannot keep food or water in your stomach.  You are struggling with not drinking alcohol.  You cannot stop drinking alcohol. Get help right away if:  You have an irregular heartbeat.  You have chest pain.  You have trouble breathing.  You have symptoms of severe withdrawal, such as:  A fever.  Seizures.  Severe confusion.  Hallucinations. This information is not intended to replace advice given to you by your health care provider. Make sure you discuss any questions you have with your health care provider. Document Released: 05/23/2005 Document Revised: 12/21/2015 Document Reviewed: 06/01/2014 Elsevier Interactive Patient Education  2017 [place] dependency is an addiction to drugs or alcohol. People with this addiction repeatedly seek out and use drugs or alcohol despite negative consequences to the health and safety of themselves and others. Addiction changes the way the brain works. Because of these changes, addiction is a chronic condition. The medical term for addiction or chemical dependency is substance use disorder. The disorder can be mild, moderate, or severe. People can be dependent on a range of substances. These include alcohol, prescription medicines, and  illegal or street drugs, such as marijuana, heroin, and cocaine. What are the causes? This condition is caused by the effect that the abused substance has on the brain. What increases the risk? The following factors may make you more likely to develop this condition:  Havinga family history of chemical dependency.  Having mental health issues, such as depression, anxiety, or bipolar disorder.  Living in an environment where drugs and alcohol are easily available.  Using drugs or alcohol at a young age.  Having friends who use drugs or alcohol.  Having poor social skills.  Tending to be aggressive or impulsive. What are the signs or symptoms? Symptoms may vary depending on the substance that you are addicted to. Symptoms may include the following: Physical Symptoms   The inability to limit the use of drugs  or alcohol.  Having nausea, sweating, shakiness, and anxiety when you are not using alcohol or drugs.  Needing a greater amount of drugs or alcohol to get the same effect (developing tolerance).  A change in:  Sleeping habits.  Eating or appetite.  Appearance or how you care for yourself. Emotional Symptoms   Angry outbursts.  Periods of sadness and tearfulness.  Isolation. Relationship Problems   Loved ones suggesting that you have a problem.  Increased fights.  Forgetting commitments.  Having affairs or one-night stands. Problems Related to Irresponsibility   Legal problems.  Irresponsibility with money.  Missing work.  Poor decision making. How is this diagnosed? This condition may be diagnosed based on your symptoms, your medical history, and a physical exam. You may also have blood tests and urine tests. How is this treated? Treatment for this condition depends on the substance that you are addicted to and whether your dependency is mild, moderate, or severe. Treatment options may include:  Stopping substance use safely. This may require taking  medicines and being closely observed for several days.  Taking part in group and individual counseling with mental health providers who help people with chemical dependency.  Staying at a residential treatment center for several days or weeks.  Attending daily counseling sessions at a treatment center.  Taking medicine as told by your health care provider to:  Ease symptoms and prevent complications during withdrawal.  Treat other mental health issues, such as depression or anxiety.  Block cravings by causing the same effects as the substance.  Block the effects of the substance or replace good sensations with unpleasant ones.  Going to a support group to share your experience with others who are going through the same thing. These groups are an important part of long-term recovery for many people. They include 12-step programs like Alcoholics Anonymous and Narcotics Anonymous. Recovery can be a long process. Many people who undergo treatment start using the substance again after stopping. This is called a relapse. If you have a relapse, it does not mean that treatment will not work. Follow these instructions at home:  Avoid temptations or triggers that you associate with your use of the substance.  Learn and practice techniques for managing stress.  Have a plan for vulnerable moments.  Get phone numbers of those who are willing to help and who are committed to your recovery.  Know when and where the meetings that you have chosen will occur.  Take over-the-counter and prescription medicines only as told by your health care provider.  Keep all follow-up visits as told by your health care provider. This is important. Contact a health care provider if:  You cannot take your medicines as told.  Your symptoms get worse.  You have trouble resisting the urge to use drugs or alcohol.  You are in pain, shaking, sweating, or feeling generally unwell.  You are losing weight without  trying to. Get help right away if:  You lose consciousness.  Your breathing is slow.  Your pulse is slow or jumpy.  You have serious thoughts about hurting yourself or someone else.  You have a relapse. This information is not intended to replace advice given to you by your health care provider. Make sure you discuss any questions you have with your health care provider. Document Released: 08/07/2001 Document Revised: 09/19/2015 Document Reviewed: 04/20/2015 Elsevier Interactive Patient Education  2017 [employer].   Finding Treatment for Addiction What is addiction? Addiction is a complex disease  of the brain. It causes an uncontrollable (compulsive) need for a substance. You can be addicted to alcohol, illegal drugs, or prescription medicines such as painkillers. Addiction can also be a behavior, like gambling or shopping. The need for the drug or activity can become so strong that you think about it all the time. You can also become physically dependent on a substance. Addiction can change the way your brain works. Because of these changes, getting more of whatever you are addicted to becomes the most important thing to you and feels better than other activities or relationships. Addiction can lead to changes in health, behavior, emotions, relationships, and choices that affect you and everyone around you. How do I know if I need treatment for addiction? Addiction is a progressive disease. Without treatment, addiction can get worse. Living with addiction puts you at higher risk for injury, poor health, lost employment, loss of money, and even death. You might need treatment for addiction if:  You have tried to stop or cut down, but you cannot.  Your addiction is causing physical health problems.  You find it annoying that your friends and family are concerned about your alcohol or substance use.  You feel guilty about substance abuse or a compulsive behavior.  You have lied or  tried to hide your addiction.  You need a particular substance or activity to start your day or to calm down.  You are getting in trouble at school, work, home, or with the police.  You have done something illegal to support your addiction.  You are running out of money because of your addiction.  You have no time for anything other than your addiction. What types of treatment are available? The treatment program that is right for you will depend on many factors, including the type of addiction you have. Treatment programs can be outpatient or inpatient. In an outpatient program, you live at home and go to work or school, but you also go to a clinic for treatment. With an inpatient program, you live and sleep at the program facility during treatment. After treatment, you might need a plan for support during recovery. Other treatment options include:  Medicine.  Some addictions may be treated with prescription medicines.  You might also need medicine to treat anxiety or depression.  Counseling and behavior therapy. Therapy can help individuals and families behave in healthier ways and relate more effectively.  Support groups. Confidential group therapy, such as a 12-step program, can help individuals and families during treatment and recovery. No single type of program is right for everyone. Many treatment programs involve a combination of education, counseling, and a 12-step, spiritually-based approach. Some treatment programs are government sponsored. They are geared for patients who do not have private insurance. Treatment programs can vary in many respects, such as:  Cost and types of insurance that are accepted.  Types of on-site medical services that are offered.  Length of stay, setting, and size.  Overall philosophy of treatment. What should I consider when selecting a treatment program? It is important to think about your individual requirements when selecting a treatment  program. There are a number of things to consider, such as:  If the program is certified by the appropriate government agency. Even private programs must be certified and employ certified professionals.  If the program is covered by your insurance. If finances are a concern, the first call you should make is to your insurance company, if you have health insurance. Ask for  a list of treatment programs that are in your network, and confirm any copayments and deductibles that you may have to pay.  If you do not have insurance, or if you choose to attend a program that does not accept your insurance, discuss whether a payment plan can be set up.  If treatment is available in languages other than English, if needed.  If the program offers detoxification treatment, if needed.  If 12-step meetings are held at the center or if transport is available for patients to attend meetings at other locations.  If the program is professional, organized, and clean.  If the program meets all of your needs, including physical and cultural needs.  If the facility offers specific treatment for your particular addiction.  If support continues to be offered after you have left the program.  If your treatment plan is continually looked at to make sure you are receiving the right treatment at the right time.  If mental health counseling is part of your treatment.  If medicine is included in treatment, if needed.  If your family is included in your treatment plan and if support is offered to them throughout the treatment process.  How the treatment works to prevent relapse. Where else can I get help?  Your health care provider. Ask him or her to help you find addiction treatment. These discussions are confidential.  The [employer] on Alcoholism and Drug Dependence (NCADD). This group has information about treatment centers and programs for people who have an addiction and for family members.  The  telephone number is 1-800-NCA-CALL (386-354-3304).  The website is https://ncadd.org/about-ncadd/our-affiliates  The Substance Abuse and Mental Health Services Administration [facility]). This group will help you find publicly funded treatment centers, help hotlines, and counseling services near you.  The telephone number is 1-800-662-HELP 212 306 8047).  The website is www.findtreatment.[employer].com.cy In countries outside of the [place] and [place], look in [employer] for contact information for services in your area. This information is not intended to replace advice given to you by your health care provider. Make sure you discuss any questions you have with your health care provider. Document Released: 07/12/2005 Document Revised: 07/09/2016 Document Reviewed: 06/01/2014 Elsevier Interactive Patient Education  2017 [place].   What You Need To Know About Alcohol Abuse and Dependence, Adult Alcohol is a widely available drug. People who use alcohol will consume it in varying amounts. People who drink alcohol in excess, and have behavior problems during and after drinking alcohol, may have what is called an alcohol use disorder. Alcohol abuse and alcohol dependence are the two main types of alcohol use disorders:  Alcohol abuse is when you use alcohol too much or too often. You may use alcohol to make yourself feel happy or to reduce stress, but you may have a hard time setting a limit on the amount you drink.  Alcohol dependence is when you use alcohol excessively for a period of time, and your body and brain chemistry changes as a result. This can make it hard to stop drinking because you may start to feel sick or feel different when you do not use alcohol. How can alcohol abuse and dependence affect me? Alcohol abuse and dependence can have a negative effect on your life. Excessive use of alcohol may lead to an addiction. You may feel like you need alcohol to function normally. You  may drink alcohol before work in the morning, during the day, or as soon as you get home from  work in the evening. These actions can result in:  Poor performance at work.  Losing your job.  Financial problems.  Car crashes or criminal charges from driving after drinking alcohol.  Problems in your relationships with friends and family.  Losing the trust and respect of co-workers, friends, and family. Drinking heavily over a long period of time can permanently damage your body and brain, and can cause lifelong health issues, such as:  Liver disease.  Heart problems, high blood pressure, or stroke.  Damage to your pancreas.  Certain cancers.  Decreased ability to fight infections.  Numbness or tingling in hands or feet (neuropathy).  Brain damage.  Depression.  Early (premature) death. When your body craves alcohol, it is easy to drink more than your body can handle. As a result, you may overdose. Alcohol overdose is a serious situation that requires hospitalization. It may lead to permanent injuries or death. What are the benefits of avoiding alcohol use? Limiting or avoiding alcohol can help you:  Avoid risks to your body, brain, and relationships.  Avoid the risk of abusing or becoming dependent on alcohol.  Keep your mind and body healthy. As a result, you may be more likely to accomplish your life goals.  Avoid permanent injury, organ damage, or death due to alcohol use. What steps can I take to stop drinking?  The best way to avoid alcohol abuse, dependence, and addiction is not to drink at all, or to drink measured amounts. Measured drinking means no more than 1 drink a day for nonpregnant women and 2 drinks a day for men. One drink equals 12 oz of beer, 5 oz of wine, or 1 oz of hard liquor.  Stop drinking if you have been drinking too much. This can be very hard to do if you are used to abusing alcohol. If you find it hard to stop drinking, talk about your  experience with someone you trust. This person may be able to help you change your drinking behavior.  Instead of drinking alcohol, do something else, like a hobby or exercise.  Find healthy ways to cope with stress, such as exercise, meditation, or spending time with people you care about.  In social gatherings and places where there may be alcohol, make intentional choices to drink non-alcohol beverages.  If your family, co-workers, or friends drink, talk to them about supporting you in your efforts to stop drinking. Ask them not to drink around you. Spend more time with people who do not drink alcohol.  If you think that you have an alcohol dependency problem:  Tell friends or family about your concerns.  Talk with your health care provider or another health professional about where to get help.  Work with a Transport planner and a Regulatory affairs officer.  Consider joining a support group for people who struggle with alcohol abuse, dependence, and addiction. Where to find support: You can get support for preventing alcohol abuse, dependence, and addiction from:  Your health care provider.  Alcoholics Anonymous (AA): NicTax.com.pt  SMART Recovery: www.smartrecovery.org  Local treatment centers or chemical dependency counselors. Where to find more information: Learn more about alcohol abuse and dependence from:  Centers for Disease Control and Prevention: GoalForum.com.au  [employer] on Alcohol Abuse and Alcoholism: https://clark.org/  Local AA groups in your community. Contact a health care provider if:  You drink more or for longer than you intended, on more than one occasion.  You tried to stop drinking or to cut back on  how much you drink, but you were not able to.  You often drink to the point of vomiting or passing out.  You want to drink so badly that you cannot think about anything  else.  Drinking has created problems in your life, but you continue to drink.  You keep drinking even though you feel anxious, depressed, or have experienced memory loss.  You have stopped doing the things you used to enjoy in order to drink.  You have to drink more than you used to in order to get the effect you want.  You experience anxiety, sweating, nausea, shakiness, and trouble sleeping when you try to stop drinking.  You have thoughts about hurting yourself or others. If you ever feel like you may hurt yourself or others, or have thoughts about taking your own life, get help right away. You can go to your nearest emergency department or call:  Your local emergency services (911 in the [place]  A suicide crisis helpline, such as the [place] at 6514359579. This is open 24 hours a day. Summary  Alcohol is a widely available drug. Misusing, abusing, and becoming dependent on alcohol can cause many problems.  It is important to measure and limit the amount of alcohol you consume. It is recommended to limit alcohol use to 1 drink a day for nonpregnant women and 2 drinks a day for men.  The risks associated with drinking too much will have a direct negative impact on your work, relationships, and health.  If you realize that you are having some challenges keeping your drinking under control, find some ways to change your behavior. Hobbies, self calming activities, exercise, or support groups can help.  If you feel you need help with changing your drinking habits, talk with your health care provider, a good friend, or a therapist, or go to an [facility] group. This information is not intended to replace advice given to you by your health care provider. Make sure you discuss any questions you have with your health care provider. Document Released: 08/07/2016 Document Revised: 08/07/2016 Document Reviewed: 08/07/2016 Elsevier Interactive Patient Education  2017  [employer].

## 2017-01-28 ENCOUNTER — Emergency Department (HOSPITAL_COMMUNITY)
Admission: EM | Admit: 2017-01-28 | Discharge: 2017-01-28 | Disposition: A | Discharge status: Home / Self Care | Payer: Medicaid Other | Source: Home / Self Care | Attending: Emergency Medicine | Admitting: Emergency Medicine

## 2017-01-28 ENCOUNTER — Encounter (HOSPITAL_COMMUNITY): Payer: Self-pay | Admitting: Emergency Medicine

## 2017-01-28 DIAGNOSIS — F10929 Alcohol use, unspecified with intoxication, unspecified: Secondary | ICD-10-CM

## 2017-01-28 DIAGNOSIS — F17210 Nicotine dependence, cigarettes, uncomplicated: Secondary | ICD-10-CM

## 2017-01-28 DIAGNOSIS — Z91040 Latex allergy status: Secondary | ICD-10-CM | POA: Insufficient documentation

## 2017-01-28 DIAGNOSIS — F10920 Alcohol use, unspecified with intoxication, uncomplicated: Secondary | ICD-10-CM

## 2017-01-28 DIAGNOSIS — Z853 Personal history of malignant neoplasm of breast: Secondary | ICD-10-CM | POA: Insufficient documentation

## 2017-01-28 DIAGNOSIS — I10 Essential (primary) hypertension: Secondary | ICD-10-CM | POA: Insufficient documentation

## 2017-01-28 DIAGNOSIS — Z79899 Other long term (current) drug therapy: Secondary | ICD-10-CM

## 2017-01-28 LAB — COMPREHENSIVE METABOLIC PANEL
ALBUMIN: 4.2 g/dL (ref 3.5–5.0)
ALT: 50 U/L (ref 14–54)
ANION GAP: 16 — AB (ref 5–15)
AST: 81 U/L — AB (ref 15–41)
Alkaline Phosphatase: 83 U/L (ref 38–126)
BILIRUBIN TOTAL: 0.5 mg/dL (ref 0.3–1.2)
BUN: 10 mg/dL (ref 6–20)
CHLORIDE: 103 mmol/L (ref 101–111)
CO2: 19 mmol/L — ABNORMAL LOW (ref 22–32)
Calcium: 8.6 mg/dL — ABNORMAL LOW (ref 8.9–10.3)
Creatinine, Ser: 0.73 mg/dL (ref 0.44–1.00)
GFR calc Af Amer: >60 mL/min (ref >60)
GFR calc non Af Amer: >60 mL/min (ref >60)
GLUCOSE: 77 mg/dL (ref 65–99)
POTASSIUM: 3.4 mmol/L — AB (ref 3.5–5.1)
SODIUM: 138 mmol/L (ref 135–145)
TOTAL PROTEIN: 7.7 g/dL (ref 6.5–8.1)

## 2017-01-28 LAB — CBC WITH DIFFERENTIAL/PLATELET
BASOS ABS: 0.1 10*3/uL (ref 0.0–0.1)
BASOS PCT: 1 %
Eosinophils Absolute: 0.1 10*3/uL (ref 0.0–0.7)
Eosinophils Relative: 2 %
HEMATOCRIT: 42.9 % (ref 36.0–46.0)
HEMOGLOBIN: 14.2 g/dL (ref 12.0–15.0)
LYMPHS PCT: 38 %
Lymphs Abs: 3.1 10*3/uL (ref 0.7–4.0)
MCH: 28.7 pg (ref 26.0–34.0)
MCHC: 33.1 g/dL (ref 30.0–36.0)
MCV: 86.7 fL (ref 78.0–100.0)
MONOS PCT: 10 %
Monocytes Absolute: 0.8 10*3/uL (ref 0.1–1.0)
NEUTROS ABS: 4.1 10*3/uL (ref 1.7–7.7)
NEUTROS PCT: 49 %
Platelets: 348 10*3/uL (ref 150–400)
RBC: 4.95 MIL/uL (ref 3.87–5.11)
RDW: 15.4 % (ref 11.5–15.5)
WBC: 8.3 10*3/uL (ref 4.0–10.5)

## 2017-01-28 LAB — RAPID URINE DRUG SCREEN, HOSP PERFORMED
Amphetamines: NOT DETECTED
Barbiturates: NOT DETECTED
Benzodiazepines: POSITIVE — AB
Cocaine: NOT DETECTED
Opiates: NOT DETECTED
Tetrahydrocannabinol: NOT DETECTED

## 2017-01-28 LAB — URINALYSIS, ROUTINE W REFLEX MICROSCOPIC
Bilirubin Urine: NEGATIVE
GLUCOSE, UA: NEGATIVE mg/dL
HGB URINE DIPSTICK: NEGATIVE
KETONES UR: NEGATIVE mg/dL
Nitrite: NEGATIVE
PROTEIN: NEGATIVE mg/dL
Specific Gravity, Urine: 1.004 — ABNORMAL LOW (ref 1.005–1.030)
pH: 7 (ref 5.0–8.0)

## 2017-01-28 LAB — ETHANOL: Alcohol, Ethyl (B): 355 mg/dL (ref <5)

## 2017-01-28 LAB — I-STAT BETA HCG BLOOD, ED (MC, WL, AP ONLY): I-stat hCG, quantitative: <5 m[IU]/mL (ref <5)

## 2017-01-28 MED ORDER — LORAZEPAM 2 MG/ML IJ SOLN
0.0000 mg | Freq: Four times a day (QID) | INTRAMUSCULAR | Status: DC
Start: 2017-01-28 — End: 2017-01-28
  Administered 2017-01-28: 2 mg via INTRAVENOUS
  Filled 2017-01-28: qty 1

## 2017-01-28 MED ORDER — LORAZEPAM 1 MG PO TABS: 0.0000 mg | ORAL_TABLET | Freq: Four times a day (QID) | ORAL | Status: DC

## 2017-01-28 MED ORDER — VITAMIN B-1 100 MG PO TABS: 100.0000 mg | ORAL_TABLET | Freq: Every day | ORAL | Status: DC

## 2017-01-28 MED ORDER — LORAZEPAM 1 MG PO TABS: 0.0000 mg | ORAL_TABLET | Freq: Two times a day (BID) | ORAL | Status: DC

## 2017-01-28 MED ORDER — SODIUM CHLORIDE 0.9 % IV BOLUS (SEPSIS)
1000.0000 mL | Freq: Once | INTRAVENOUS | Status: AC
  Administered 2017-01-28: 1000 mL via INTRAVENOUS

## 2017-01-28 MED ORDER — LORAZEPAM 2 MG/ML IJ SOLN: 0.0000 mg | Freq: Two times a day (BID) | INTRAMUSCULAR | Status: DC

## 2017-01-28 MED ORDER — THIAMINE HCL 100 MG/ML IJ SOLN
100.0000 mg | Freq: Every day | INTRAMUSCULAR | Status: DC
  Administered 2017-01-28: 100 mg via INTRAVENOUS
  Filled 2017-01-28: qty 2

## 2017-01-28 MED ORDER — CHLORDIAZEPOXIDE HCL 25 MG PO CAPS: ORAL_CAPSULE | ORAL | 0 refills | Status: DC

## 2017-01-28 MED ORDER — LORAZEPAM 2 MG/ML IJ SOLN
1.0000 mg | Freq: Once | INTRAMUSCULAR | Status: AC
  Administered 2017-01-28: 1 mg via INTRAVENOUS
  Filled 2017-01-28: qty 1

## 2017-01-28 NOTE — ED Notes (Signed)
PT states understanding of care given, follow up care, and medication prescribed. PT ambulated from ED to car with a steady gait. 

## 2017-01-28 NOTE — Discharge Instructions (Signed)
You have been seen today expressing interest in detox from alcohol. Please use the librium taper as prescribed to alleviate symptoms of alcohol withdrawal. Follow up with the listed outpatient detox program resources as soon as possible. Return to the ED as needed.

## 2017-01-28 NOTE — ED Triage Notes (Signed)
Patient from home with [facility] for 2x self reported unwitnessed seizures.  Patient states she is detoxing from alcohol, had one 24oz beer today, normally drinks 6-7 each day.  Patient alert and oriented at this time and in no apparent distress.
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
## 2017-01-28 NOTE — ED Notes (Signed)
Bedside commode at pt's bedside.

## 2017-01-28 NOTE — ED Notes (Signed)
Pt stating that she cannot control her shaking. Informed Shawn - PA and Carlis Abbott - RN.

## 2017-01-28 NOTE — ED Notes (Signed)
ED Provider at bedside. 

## 2017-01-28 NOTE — ED Notes (Signed)
Pt's O2 dropped to 87%. Pt placed on 2L of O2. Chrislyn - RN aware.

## 2017-01-29 ENCOUNTER — Inpatient Hospital Stay (HOSPITAL_COMMUNITY)
Admission: EM | Admit: 2017-01-29 | Discharge: 2017-02-03 | DRG: 897 | Disposition: A | Discharge status: Home / Self Care | Payer: Medicaid Other | Attending: Internal Medicine | Admitting: Internal Medicine

## 2017-01-29 ENCOUNTER — Encounter (HOSPITAL_COMMUNITY): Payer: Self-pay | Admitting: *Deleted

## 2017-01-29 DIAGNOSIS — F10232 Alcohol dependence with withdrawal with perceptual disturbance: Secondary | ICD-10-CM

## 2017-01-29 DIAGNOSIS — K760 Fatty (change of) liver, not elsewhere classified: Secondary | ICD-10-CM | POA: Diagnosis present

## 2017-01-29 DIAGNOSIS — Z853 Personal history of malignant neoplasm of breast: Secondary | ICD-10-CM | POA: Diagnosis not present

## 2017-01-29 DIAGNOSIS — Z886 Allergy status to analgesic agent status: Secondary | ICD-10-CM

## 2017-01-29 DIAGNOSIS — Z882 Allergy status to sulfonamides status: Secondary | ICD-10-CM | POA: Diagnosis not present

## 2017-01-29 DIAGNOSIS — Z881 Allergy status to other antibiotic agents status: Secondary | ICD-10-CM

## 2017-01-29 DIAGNOSIS — Z79891 Long term (current) use of opiate analgesic: Secondary | ICD-10-CM

## 2017-01-29 DIAGNOSIS — Z888 Allergy status to other drugs, medicaments and biological substances status: Secondary | ICD-10-CM | POA: Diagnosis not present

## 2017-01-29 DIAGNOSIS — F17210 Nicotine dependence, cigarettes, uncomplicated: Secondary | ICD-10-CM | POA: Diagnosis present

## 2017-01-29 DIAGNOSIS — R569 Unspecified convulsions: Secondary | ICD-10-CM | POA: Diagnosis present

## 2017-01-29 DIAGNOSIS — E876 Hypokalemia: Secondary | ICD-10-CM | POA: Diagnosis present

## 2017-01-29 DIAGNOSIS — Z79899 Other long term (current) drug therapy: Secondary | ICD-10-CM

## 2017-01-29 DIAGNOSIS — F10239 Alcohol dependence with withdrawal, unspecified: Secondary | ICD-10-CM | POA: Diagnosis not present

## 2017-01-29 DIAGNOSIS — Z91040 Latex allergy status: Secondary | ICD-10-CM

## 2017-01-29 DIAGNOSIS — R030 Elevated blood-pressure reading, without diagnosis of hypertension: Secondary | ICD-10-CM | POA: Diagnosis present

## 2017-01-29 DIAGNOSIS — F19230 Other psychoactive substance dependence with withdrawal, uncomplicated: Secondary | ICD-10-CM | POA: Diagnosis not present

## 2017-01-29 DIAGNOSIS — H9042 Sensorineural hearing loss, unilateral, left ear, with unrestricted hearing on the contralateral side: Secondary | ICD-10-CM | POA: Diagnosis present

## 2017-01-29 DIAGNOSIS — F19239 Other psychoactive substance dependence with withdrawal, unspecified: Secondary | ICD-10-CM

## 2017-01-29 DIAGNOSIS — F10939 Alcohol use, unspecified with withdrawal, unspecified: Secondary | ICD-10-CM | POA: Diagnosis present

## 2017-01-29 LAB — I-STAT BETA HCG BLOOD, ED (MC, WL, AP ONLY): I-stat hCG, quantitative: <5 m[IU]/mL (ref <5)

## 2017-01-29 LAB — CBC WITH DIFFERENTIAL/PLATELET
Basophils Absolute: 0.1 10*3/uL (ref 0.0–0.1)
Basophils Relative: 1 %
Eosinophils Absolute: 0.1 10*3/uL (ref 0.0–0.7)
Eosinophils Relative: 1 %
HEMATOCRIT: 41.7 % (ref 36.0–46.0)
Hemoglobin: 14.2 g/dL (ref 12.0–15.0)
LYMPHS ABS: 2.1 10*3/uL (ref 0.7–4.0)
LYMPHS PCT: 21 %
MCH: 29.6 pg (ref 26.0–34.0)
MCHC: 34.1 g/dL (ref 30.0–36.0)
MCV: 86.9 fL (ref 78.0–100.0)
MONO ABS: 0.7 10*3/uL (ref 0.1–1.0)
MONOS PCT: 7 %
NEUTROS ABS: 7.3 10*3/uL (ref 1.7–7.7)
Neutrophils Relative %: 72 %
Platelets: 331 10*3/uL (ref 150–400)
RBC: 4.8 MIL/uL (ref 3.87–5.11)
RDW: 15.8 % — AB (ref 11.5–15.5)
WBC: 10.2 10*3/uL (ref 4.0–10.5)

## 2017-01-29 LAB — RAPID URINE DRUG SCREEN, HOSP PERFORMED
AMPHETAMINES: NOT DETECTED
BARBITURATES: NOT DETECTED
Benzodiazepines: POSITIVE — AB
Cocaine: NOT DETECTED
OPIATES: NOT DETECTED
TETRAHYDROCANNABINOL: NOT DETECTED

## 2017-01-29 LAB — COMPREHENSIVE METABOLIC PANEL
ALBUMIN: 4.2 g/dL (ref 3.5–5.0)
ALT: 54 U/L (ref 14–54)
ANION GAP: 14 (ref 5–15)
AST: 78 U/L — AB (ref 15–41)
Alkaline Phosphatase: 82 U/L (ref 38–126)
BUN: 9 mg/dL (ref 6–20)
CHLORIDE: 103 mmol/L (ref 101–111)
CO2: 22 mmol/L (ref 22–32)
Calcium: 9.3 mg/dL (ref 8.9–10.3)
Creatinine, Ser: 0.65 mg/dL (ref 0.44–1.00)
GFR calc Af Amer: >60 mL/min (ref >60)
GFR calc non Af Amer: >60 mL/min (ref >60)
GLUCOSE: 86 mg/dL (ref 65–99)
POTASSIUM: 4.3 mmol/L (ref 3.5–5.1)
SODIUM: 139 mmol/L (ref 135–145)
Total Bilirubin: 0.8 mg/dL (ref 0.3–1.2)
Total Protein: 8 g/dL (ref 6.5–8.1)

## 2017-01-29 LAB — ETHANOL: Alcohol, Ethyl (B): <5 mg/dL (ref <5)

## 2017-01-29 LAB — CBG MONITORING, ED: GLUCOSE-CAPILLARY: 82 mg/dL (ref 65–99)

## 2017-01-29 MED ORDER — LORAZEPAM 2 MG/ML IJ SOLN: 1.0000 mg | Freq: Four times a day (QID) | INTRAMUSCULAR | Status: DC | PRN

## 2017-01-29 MED ORDER — VITAMIN B-1 100 MG PO TABS: 100.0000 mg | ORAL_TABLET | Freq: Every day | ORAL | Status: DC

## 2017-01-29 MED ORDER — LORAZEPAM 2 MG/ML IJ SOLN
0.0000 mg | Freq: Four times a day (QID) | INTRAMUSCULAR | Status: DC
  Administered 2017-01-29: 2 mg via INTRAVENOUS
  Filled 2017-01-29: qty 1

## 2017-01-29 MED ORDER — ACETAMINOPHEN 325 MG PO TABS
650.0000 mg | ORAL_TABLET | Freq: Four times a day (QID) | ORAL | Status: DC | PRN
  Administered 2017-02-01: 650 mg via ORAL
  Filled 2017-01-29: qty 2

## 2017-01-29 MED ORDER — THIAMINE HCL 100 MG/ML IJ SOLN
100.0000 mg | Freq: Every day | INTRAMUSCULAR | Status: DC
  Administered 2017-01-31 – 2017-02-03 (×4): 100 mg via INTRAVENOUS
  Filled 2017-01-29 (×4): qty 2

## 2017-01-29 MED ORDER — FOLIC ACID 1 MG PO TABS
1.0000 mg | ORAL_TABLET | Freq: Every day | ORAL | Status: DC
  Administered 2017-01-29: 1 mg via ORAL
  Filled 2017-01-29: qty 1

## 2017-01-29 MED ORDER — ACETAMINOPHEN 650 MG RE SUPP: 650.0000 mg | Freq: Four times a day (QID) | RECTAL | Status: DC | PRN

## 2017-01-29 MED ORDER — LORAZEPAM 2 MG/ML IJ SOLN: 0.0000 mg | Freq: Two times a day (BID) | INTRAMUSCULAR | Status: DC

## 2017-01-29 MED ORDER — LORAZEPAM 1 MG PO TABS: 1.0000 mg | ORAL_TABLET | Freq: Four times a day (QID) | ORAL | Status: DC | PRN

## 2017-01-29 MED ORDER — ONDANSETRON HCL 4 MG/2ML IJ SOLN
4.0000 mg | Freq: Four times a day (QID) | INTRAMUSCULAR | Status: DC | PRN
  Administered 2017-01-29 – 2017-02-03 (×4): 4 mg via INTRAVENOUS
  Filled 2017-01-29 (×4): qty 2

## 2017-01-29 MED ORDER — FOLIC ACID 5 MG/ML IJ SOLN
1.0000 mg | Freq: Every day | INTRAMUSCULAR | Status: DC
  Administered 2017-01-31 – 2017-02-02 (×3): 1 mg via INTRAVENOUS
  Filled 2017-01-29 (×5): qty 0.2

## 2017-01-29 MED ORDER — THIAMINE HCL 100 MG/ML IJ SOLN
100.0000 mg | Freq: Every day | INTRAMUSCULAR | Status: DC
  Administered 2017-01-29: 100 mg via INTRAVENOUS
  Filled 2017-01-29: qty 2

## 2017-01-29 MED ORDER — LORAZEPAM 2 MG/ML IJ SOLN
1.0000 mg | Freq: Once | INTRAMUSCULAR | Status: AC
  Administered 2017-01-29: 1 mg via INTRAVENOUS
  Filled 2017-01-29: qty 1

## 2017-01-29 MED ORDER — SODIUM CHLORIDE 0.9 % IV SOLN
INTRAVENOUS | Status: AC
  Administered 2017-01-29: 75 mL/h via INTRAVENOUS

## 2017-01-29 MED ORDER — LORAZEPAM 2 MG/ML IJ SOLN
2.0000 mg | INTRAMUSCULAR | Status: DC | PRN
  Administered 2017-01-29 – 2017-01-30 (×14): 2 mg via INTRAVENOUS
  Administered 2017-01-31: 3 mg via INTRAVENOUS
  Administered 2017-01-31 – 2017-02-01 (×14): 2 mg via INTRAVENOUS
  Filled 2017-01-29: qty 2
  Filled 2017-01-29 (×28): qty 1

## 2017-01-29 MED ORDER — SODIUM CHLORIDE 0.9 % IV BOLUS (SEPSIS)
1000.0000 mL | Freq: Once | INTRAVENOUS | Status: AC
  Administered 2017-01-29: 1000 mL via INTRAVENOUS

## 2017-01-29 MED ORDER — ONDANSETRON HCL 4 MG PO TABS: 4.0000 mg | ORAL_TABLET | Freq: Four times a day (QID) | ORAL | Status: DC | PRN

## 2017-01-29 MED ORDER — ADULT MULTIVITAMIN W/MINERALS CH
1.0000 | ORAL_TABLET | Freq: Every day | ORAL | Status: DC
  Administered 2017-01-29: 1 via ORAL
  Filled 2017-01-29: qty 1

## 2017-01-29 NOTE — ED Notes (Signed)
Report given to [place], [place]
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
## 2017-01-29 NOTE — ED Triage Notes (Signed)
Patient arrives per EMS that reports that patient had witnessed seizure per family member, with no loc or incontinies of bowel or bladder. Patient states that she drinks 6- 24 oz beers a day and her last consumption was yesterday 01/28/17.

## 2017-01-29 NOTE — ED Notes (Signed)
Bed: OH20 Expected date:  Expected time:  Means of arrival:  Comments: EMS-ETOH/SZ

## 2017-01-29 NOTE — H&P (Signed)
History and Physical    Kristine Macias KJZ:[id number] DOB: Jan 05, 1986 DOA: 01/29/2017  PCP: Leonides Sake, MD  Patient coming from: Home.  Chief Complaint: Seizures. Tremors. Hallucination.  HPI: Kristine Macias is a 31 y.o. female with history of alcohol abuse who was recently discharged after being treated for alcohol withdrawal was brought to the ER after patient had a witnessed seizure by patient's mother. Patient admits to drinking alcohol after discharge last drink was yesterday. Denies any focal deficits. Denies any fever chills or neck pain.   ED Course: In the ER patient is tremulous tachycardic and has to be placed on CIWA protocol. Patient otherwise denies any chest pain shortness of breath abdominal pain headache at this time.  Review of Systems: As per HPI, rest all negative.   Past Medical History:  Diagnosis Date  . Alcohol abuse 11/2014   drinking since age 65. chronic, recurrent. at least 2 detox admits before 2015.   Marland Kitchen Alcoholic hepatitis 04/4800   hepatic steatosis on 11/2014 ultrasound.   . Breast cancer [facility])    age 61, lump removed from left breast  . Chlamydia 2007   bacterial vaginosis 09/2011  . Colitis 12/2014   colonoscopy for diarrhea and abnml CT 01/06/15: erythmatous TI (path: active ileitis, ? emerging IBD), rectal erythema (path: mucosal prolapse). Random bx of normal colon (path unremarkable)   . Congenital deafness    left ear only.  Also noted in mother and sibling (unilateral).   . Depression with anxiety initally at age 28  . Failure to thrive in adult 12/2014.    Malnutrition: n/v, not eating, weight loss, BMI 14.  s/p 01/11/2015 PEG (Dr Dorna Leitz).   . Hypertensive urgency   . Irregular heart beat 2010   wt/diet related after evaluation  . Pancreas divisum 02/2015   Type 1 seen on CT.   Marland Kitchen Pneumothorax, spontaneous, tension   . Renal disorder   . Seizure ([place]) 06/2015   due to ETOH/benzo withdrawal.   . Spontaneous pneumothorax 08/2012     right.  chest tube placed.   . Thrombocytopenia ([place]) 04/2007    Past Surgical History:  Procedure Laterality Date  . CESAREAN SECTION  07/2009    x 1.  G3, Para 2012.   . CHEST TUBE INSERTION    . COLONOSCOPY WITH PROPOFOL N/A 01/06/2015   [facility], Dr Rayann Heman. colonoscopy for diarrhea and abnml CT 01/06/15: erythmatous TI (path: active ileitis, ? emerging IBD), rectal erythema (path: mucosal prolapse). Random bx of normal colon (path unremarkable)   . ESOPHAGOGASTRODUODENOSCOPY N/A 01/06/2015   ;[facility], Dr Rayann Heman. For wt loss, N/V: Normal study, duodenal biopsy/pathology: chronic active duodenitis.   Marland Kitchen ESOPHAGOGASTRODUODENOSCOPY N/A 01/11/2015   Rein-normal with PEG placement  . PEG PLACEMENT N/A 01/11/2015   [facility], Dr Rayann Heman.  for N/V/wt loss/severe malnutrition.      reports that she has been smoking Cigarettes.  She has a 13.00 pack-year smoking history. She has never used smokeless tobacco. She reports that she drinks about 21.6 oz of alcohol per week . She reports that she does not use drugs.  Allergies  Allergen Reactions  . Aspirin Shortness Of Breath  . Effexor [Venlafaxine] Anaphylaxis  . Gabapentin Anaphylaxis  . Sulfa Antibiotics Anaphylaxis  . Tetracyclines & Related Anaphylaxis  . Trazodone And Nefazodone Anaphylaxis  . Latex Hives and Itching  . Paxil [Paroxetine] Hives and Itching  . Seroquel [Quetiapine Fumarate] Hives and Itching  . Zoloft [Sertraline Hcl] Hives and Itching  Family History  Problem Relation Age of Onset  . Thyroid disease Mother   . Cancer Mother        breast cancer  . Cancer Father        lung cancer  . Stroke Other   . Crohn's disease Brother   . Cancer Maternal Grandmother        breast cancer  . Anesthesia problems Neg Hx     Prior to Admission medications   Medication Sig Start Date End Date Taking? Authorizing Provider  clonazePAM (KLONOPIN) 0.5 MG tablet Take 0.5 mg by mouth daily.   Yes [provider]  Melatonin 1 MG CAPS  Take 1 mg by mouth at bedtime as needed. sleep   Yes [provider]  Multiple Vitamin (MULTIVITAMIN WITH MINERALS) TABS tablet Take 1 tablet by mouth daily. 01/21/17  Yes Johnson, Clanford L, MD  thiamine 100 MG tablet Take 1 tablet (100 mg total) by mouth daily. 12/14/16  Yes Bonnielee Haff, MD  busPIRone (BUSPAR) 5 MG tablet Take 1 tablet (5 mg total) by mouth 3 (three) times daily. 01/20/17 02/03/17  Johnson, Clanford L, MD  chlordiazePOXIDE (LIBRIUM) 25 MG capsule 57m PO TID x 1D, then 25-561mPO BID X 1D, then 25-5019mO QD X 1D 01/28/17   Joy, Shawn C, PA-C  LORazepam (ATIVAN) 0.5 MG tablet Take 1 po BID x 3 days then 1 po daily x 3 days Patient not taking: Reported on 01/28/2017 01/20/17   JohIrwin Brakeman MD  ondansetron (ZOFRAN-ODT) 4 MG disintegrating tablet Take 4 mg by mouth every 8 (eight) hours as needed for nausea or vomiting.    [provider]    Physical Exam: Vitals:   01/29/17 2100 01/29/17 2130 01/29/17 2200 01/29/17 2230  BP: (!) 141/104 (!) 134/92 (!) 133/95 (!) 140/103  Pulse: (!) 107 (!) 104 98 91  Resp: 11 17 18 17   Temp:      TempSrc:      SpO2: 97% 98% 97% 98%  Weight:      Height:          Constitutional: Moderately built and nourished. Vitals:   01/29/17 2100 01/29/17 2130 01/29/17 2200 01/29/17 2230  BP: (!) 141/104 (!) 134/92 (!) 133/95 (!) 140/103  Pulse: (!) 107 (!) 104 98 91  Resp: 11 17 18 17   Temp:      TempSrc:      SpO2: 97% 98% 97% 98%  Weight:      Height:       Eyes: Anicteric no pallor. ENMT: No discharge from the ears eyes nose or mouth. Neck: No neck rigidity no mass felt. Respiratory: No rhonchi or crepitations. Cardiovascular: S1-S2 heard no murmurs appreciated. Abdomen: Soft nontender bowel sounds present. No guarding or rigidity. Musculoskeletal: No edema. No joint effusion. Skin: No rash or skin appears warm. Neurologic: Alert awake oriented to time place and person. Moves all extremities. Psychiatric:  Appears normal. Normal affect.   Labs on Admission: I have personally reviewed following labs and imaging studies  CBC:  Recent Labs Lab 01/28/17 1101 01/29/17 2023  WBC 8.3 10.2  NEUTROABS 4.1 7.3  HGB 14.2 14.2  HCT 42.9 41.7  MCV 86.7 86.9  PLT 348 [id number] Metabolic Panel:  Recent Labs Lab 01/28/17 1101 01/29/17 2023  NA 138 139  K 3.4* 4.3  CL 103 103  CO2 19* 22  GLUCOSE 77 86  BUN 10 9  CREATININE 0.73 0.65  CALCIUM 8.6* 9.3  GFR: Estimated Creatinine Clearance: 95.4 mL/min (by C-G formula based on SCr of 0.65 mg/dL). Liver Function Tests:  Recent Labs Lab 01/28/17 1101 01/29/17 2023  AST 81* 78*  ALT 50 54  ALKPHOS 83 82  BILITOT 0.5 0.8  PROT 7.7 8.0  ALBUMIN 4.2 4.2   No results for input(s): LIPASE, AMYLASE in the last 168 hours. No results for input(s): AMMONIA in the last 168 hours. Coagulation Profile: No results for input(s): INR, PROTIME in the last 168 hours. Cardiac Enzymes: No results for input(s): CKTOTAL, CKMB, CKMBINDEX, TROPONINI in the last 168 hours. BNP (last 3 results) No results for input(s): PROBNP in the last 8760 hours. HbA1C: No results for input(s): HGBA1C in the last 72 hours. CBG:  Recent Labs Lab 01/29/17 1848  GLUCAP 82   Lipid Profile: No results for input(s): CHOL, HDL, LDLCALC, TRIG, CHOLHDL, LDLDIRECT in the last 72 hours. Thyroid Function Tests: No results for input(s): TSH, T4TOTAL, FREET4, T3FREE, THYROIDAB in the last 72 hours. Anemia Panel: No results for input(s): VITAMINB12, FOLATE, FERRITIN, TIBC, IRON, RETICCTPCT in the last 72 hours. Urine analysis:    Component Value Date/Time   COLORURINE YELLOW 01/28/2017 1125   APPEARANCEUR CLEAR 01/28/2017 1125   APPEARANCEUR Clear 02/15/2016 1456   LABSPEC 1.004 (L) 01/28/2017 1125   LABSPEC 1.006 12/20/2014 1018   PHURINE 7.0 01/28/2017 1125   GLUCOSEU NEGATIVE 01/28/2017 1125   GLUCOSEU Negative 12/20/2014 1018   HGBUR NEGATIVE 01/28/2017  1125   BILIRUBINUR NEGATIVE 01/28/2017 1125   BILIRUBINUR neg 03/13/2016 1700   BILIRUBINUR Negative 02/15/2016 1456   BILIRUBINUR Negative 12/20/2014 1018   KETONESUR NEGATIVE 01/28/2017 1125   PROTEINUR NEGATIVE 01/28/2017 1125   UROBILINOGEN negative 03/13/2016 1700   UROBILINOGEN 0.2 06/13/2012 2306   NITRITE NEGATIVE 01/28/2017 1125   LEUKOCYTESUR TRACE (A) 01/28/2017 1125   LEUKOCYTESUR Negative 02/15/2016 1456   LEUKOCYTESUR Negative 12/20/2014 1018   Sepsis Labs: @LABRCNTIP (procalcitonin:4,lacticidven:4) )No results found for this or any previous visit (from the past 240 hour(s)).   Radiological Exams on Admission: No results found.   Assessment/Plan Principal Problem:   Alcohol withdrawal ([place]) Active Problems:   Withdrawal seizures ([place])    1. Alcohol withdrawal - patient has been placed on stepdown protocol for alcohol withdrawal. Closely observe.  2. Alcohol withdrawal seizures presently on CIWA protocol. 3. Elevated blood pressure likely from alcohol withdrawal. Closely observe and follow blood pressure trends.   DVT prophylaxis: SCDs. Code Status: Full code.  Family Communication: No family at the bedside.  Disposition Plan: To be determined.  Consults called: Social work.  Admission status: Inpatient.    Rise Patience MD [facility] Pager [phone].  If 7PM-7AM, please contact night-coverage [email or web address] Password [email or web address]  01/29/2017, 11:07 PM

## 2017-01-30 DIAGNOSIS — F19230 Other psychoactive substance dependence with withdrawal, uncomplicated: Secondary | ICD-10-CM

## 2017-01-30 DIAGNOSIS — F10239 Alcohol dependence with withdrawal, unspecified: Principal | ICD-10-CM

## 2017-01-30 LAB — BASIC METABOLIC PANEL
ANION GAP: 9 (ref 5–15)
BUN: 10 mg/dL (ref 6–20)
CO2: 24 mmol/L (ref 22–32)
Calcium: 8.5 mg/dL — ABNORMAL LOW (ref 8.9–10.3)
Chloride: 103 mmol/L (ref 101–111)
Creatinine, Ser: 0.64 mg/dL (ref 0.44–1.00)
GFR calc Af Amer: >60 mL/min (ref >60)
GFR calc non Af Amer: >60 mL/min (ref >60)
GLUCOSE: 69 mg/dL (ref 65–99)
POTASSIUM: 4.2 mmol/L (ref 3.5–5.1)
Sodium: 136 mmol/L (ref 135–145)

## 2017-01-30 LAB — CBC
HEMATOCRIT: 38.7 % (ref 36.0–46.0)
Hemoglobin: 12.6 g/dL (ref 12.0–15.0)
MCH: 28.8 pg (ref 26.0–34.0)
MCHC: 32.6 g/dL (ref 30.0–36.0)
MCV: 88.4 fL (ref 78.0–100.0)
Platelets: 258 10*3/uL (ref 150–400)
RBC: 4.38 MIL/uL (ref 3.87–5.11)
RDW: 15.9 % — ABNORMAL HIGH (ref 11.5–15.5)
WBC: 7.5 10*3/uL (ref 4.0–10.5)

## 2017-01-30 MED ORDER — SODIUM CHLORIDE 0.9 % IV SOLN
INTRAVENOUS | Status: AC
Start: 2017-01-30 — End: 2017-01-31
  Administered 2017-01-31: via INTRAVENOUS

## 2017-01-30 NOTE — Progress Notes (Signed)
PROGRESS NOTE    Kristine Macias  KMM:[id number] DOB: 1986/04/11 DOA: 01/29/2017 PCP: Leonides Sake, MD    Brief Narrative:Kristine Macias is a 31 y.o. female with history of alcohol abuse who was recently discharged after being treated for alcohol withdrawal was brought to the ER after patient had a witnessed seizure by patient's mother. Patient reports taking alcohol after being discharged from the hospital.   Assessment & Plan:   Principal Problem:   Alcohol withdrawal ([place]) Active Problems:   Withdrawal seizures ([place])   Seizures from alcohol withdrawal:  Monitor in stepdown for seizures.  On CIWA protocol.  No withdrawal symptoms at this time.  She remains asymptomatic.   Alcohol abuse; SW for resources   Hypokalemia: from 6/4 replaced and repeat level normal.    DVT prophylaxis: (scd's) Code Status: (Full) Family Communication: (none at bedside") Disposition Plan: pending further evaluation, monitor in stepdown overnight.    Consultants:   None.   Procedures: none.   Antimicrobials: (none.   Subjective: Not feeling good.   Objective: Vitals:   01/30/17 0500 01/30/17 0600 01/30/17 0730 01/30/17 0800  BP: 121/86 114/72 116/81 113/79  Pulse: 73 74  (!) 59  Resp: 15 15  (!) 22  Temp:   98.5 F (36.9 C)   TempSrc:   Oral   SpO2: 95% 95%  96%  Weight:      Height:        Intake/Output Summary (Last 24 hours) at 01/30/17 1016 Last data filed at 01/30/17 0800  Gross per 24 hour  Intake              675 ml  Output                0 ml  Net              675 ml   Filed Weights   01/29/17 1840  Weight: 68 kg (150 lb)    Examination:  General exam: Appears calm and comfortable no distress.  Respiratory system: Clear to auscultation. Respiratory effort normal. Cardiovascular system: S1 & S2 heard, RRR. No JVD, murmurs, rubs, gallops or clicks. No pedal edema. Gastrointestinal system: Abdomen is nondistended, soft and nontender. No  organomegaly or masses felt. Normal bowel sounds heard. Central nervous system: Alert and oriented. No focal neurological deficits. Extremities: Symmetric 5 x 5 power. Skin: No rashes, lesions or ulcers Psychiatry: flat affect.     Data Reviewed: I have personally reviewed following labs and imaging studies  CBC:  Recent Labs Lab 01/28/17 1101 01/29/17 2023 01/30/17 0309  WBC 8.3 10.2 7.5  NEUTROABS 4.1 7.3  --   HGB 14.2 14.2 12.6  HCT 42.9 41.7 38.7  MCV 86.7 86.9 88.4  PLT 348 331 790   Basic Metabolic Panel:  Recent Labs Lab 01/28/17 1101 01/29/17 2023 01/30/17 0309  NA 138 139 136  K 3.4* 4.3 4.2  CL 103 103 103  CO2 19* 22 24  GLUCOSE 77 86 69  BUN 10 9 10   CREATININE 0.73 0.65 0.64  CALCIUM 8.6* 9.3 8.5*   GFR: Estimated Creatinine Clearance: 95.4 mL/min (by C-G formula based on SCr of 0.64 mg/dL). Liver Function Tests:  Recent Labs Lab 01/28/17 1101 01/29/17 2023  AST 81* 78*  ALT 50 54  ALKPHOS 83 82  BILITOT 0.5 0.8  PROT 7.7 8.0  ALBUMIN 4.2 4.2   No results for input(s): LIPASE, AMYLASE in the last 168 hours. No results for input(s):  AMMONIA in the last 168 hours. Coagulation Profile: No results for input(s): INR, PROTIME in the last 168 hours. Cardiac Enzymes: No results for input(s): CKTOTAL, CKMB, CKMBINDEX, TROPONINI in the last 168 hours. BNP (last 3 results) No results for input(s): PROBNP in the last 8760 hours. HbA1C: No results for input(s): HGBA1C in the last 72 hours. CBG:  Recent Labs Lab 01/29/17 1848  GLUCAP 82   Lipid Profile: No results for input(s): CHOL, HDL, LDLCALC, TRIG, CHOLHDL, LDLDIRECT in the last 72 hours. Thyroid Function Tests: No results for input(s): TSH, T4TOTAL, FREET4, T3FREE, THYROIDAB in the last 72 hours. Anemia Panel: No results for input(s): VITAMINB12, FOLATE, FERRITIN, TIBC, IRON, RETICCTPCT in the last 72 hours. Sepsis Labs: No results for input(s): PROCALCITON, LATICACIDVEN in the last  168 hours.  No results found for this or any previous visit (from the past 240 hour(s)).       Radiology Studies: No results found.      Scheduled Meds: . folic acid  1 mg Intravenous Daily  . thiamine  100 mg Intravenous Daily   Continuous Infusions: . sodium chloride 75 mL/hr at 01/30/17 0800     LOS: 1 day    Time spent: 6 George Ina, MD [facility] Pager [phone]  If 7PM-7AM, please contact night-coverage [email or web address] Password [email or web address] 01/30/2017, 10:16 AM

## 2017-01-30 NOTE — Care Management Note (Signed)
Case Management Note  Patient Details  Name: Kristine Macias MRN: [id number] Date of Birth: 1986-05-05  Subjective/Objective:                  31 y.o. female with history of alcohol abuse who was recently discharged after being treated for alcohol withdrawal was brought to the ER after patient had a witnessed seizure by patient's mother. Patient admits to drinking alcohol after discharge last drink was yesterday. Denies any focal deficits. Denies any fever chills or neck pain.   ED Course: In the ER patient is tremulous tachycardic and has to be placed on CIWA protocol. Patient otherwise denies any chest pain shortness of breath abdominal pain headache at this time.   Action/Plan: Date:  January 30, 2017 Chart reviewed for concurrent status and case management needs. Will continue to follow patient progress. Discharge Planning: following for needs Expected discharge date: [id number] Velva Harman, BSN, [place], [place]  Expected Discharge Date:   (unknown)               Expected Discharge Plan:     In-House Referral:     Discharge planning Services     Post Acute Care Choice:    Choice offered to:     DME Arranged:    DME Agency:     HH Arranged:    [place] Agency:     Status of Service:     If discussed at [employer] of Stay Meetings, dates discussed:    Additional Comments:  Leeroy Cha, RN 01/30/2017, 8:50 AM

## 2017-01-31 DIAGNOSIS — F10232 Alcohol dependence with withdrawal with perceptual disturbance: Secondary | ICD-10-CM

## 2017-01-31 LAB — BASIC METABOLIC PANEL
ANION GAP: 9 (ref 5–15)
BUN: 6 mg/dL (ref 6–20)
CALCIUM: 8.7 mg/dL — AB (ref 8.9–10.3)
CO2: 22 mmol/L (ref 22–32)
Chloride: 105 mmol/L (ref 101–111)
Creatinine, Ser: 0.52 mg/dL (ref 0.44–1.00)
GFR calc non Af Amer: >60 mL/min (ref >60)
GLUCOSE: 90 mg/dL (ref 65–99)
Potassium: 3.2 mmol/L — ABNORMAL LOW (ref 3.5–5.1)
Sodium: 136 mmol/L (ref 135–145)

## 2017-01-31 LAB — CBC
HCT: 37.3 % (ref 36.0–46.0)
Hemoglobin: 12.3 g/dL (ref 12.0–15.0)
MCH: 28.8 pg (ref 26.0–34.0)
MCHC: 33 g/dL (ref 30.0–36.0)
MCV: 87.4 fL (ref 78.0–100.0)
PLATELETS: 232 10*3/uL (ref 150–400)
RBC: 4.27 MIL/uL (ref 3.87–5.11)
RDW: 15.3 % (ref 11.5–15.5)
WBC: 6.2 10*3/uL (ref 4.0–10.5)

## 2017-01-31 MED ORDER — POTASSIUM CHLORIDE CRYS ER 20 MEQ PO TBCR
40.0000 meq | EXTENDED_RELEASE_TABLET | Freq: Once | ORAL | Status: AC
  Administered 2017-01-31: 40 meq via ORAL
  Filled 2017-01-31: qty 2

## 2017-01-31 MED ORDER — POTASSIUM CHLORIDE CRYS ER 20 MEQ PO TBCR
40.0000 meq | EXTENDED_RELEASE_TABLET | Freq: Two times a day (BID) | ORAL | Status: AC
  Administered 2017-01-31 (×2): 40 meq via ORAL
  Filled 2017-01-31 (×2): qty 2

## 2017-01-31 NOTE — Progress Notes (Signed)
PROGRESS NOTE    Kristine Macias  PZW:[id number] DOB: November 27, 1985 DOA: 01/29/2017 PCP: Leonides Sake, MD    Brief Narrative:Kristine Macias is a 31 y.o. female with history of alcohol abuse who was recently discharged after being treated for alcohol withdrawal was brought to the ER after patient had a witnessed seizure by patient's mother. Patient reports taking alcohol after being discharged from the hospital. Today she doesn't feel good. Doesn't want to be discharged today.   Assessment & Plan:   Principal Problem:   Alcohol withdrawal ([place]) Active Problems:   Withdrawal seizures ([place])   Seizures from alcohol withdrawal:  No seizures since admission. On CIWA protocol.  Has some tremors in her arms and very anxious.  Would recommend to continue with IV fluids and monitor for withdrawal symptoms.   Alcohol abuse; SW for resources Currently she is refusing any resources.    Hypokalemia: from 6/4 replaced and repeat level normal.    DVT prophylaxis: (scd's) Code Status: (Full) Family Communication: (none at bedside") Disposition Plan: hme tomorrow if no withdrawal symptoms.    Consultants:   None.   Procedures: none.   Antimicrobials: (none.   Subjective: Not feeling good. Has some tremors in the arms.   Objective: Vitals:   01/31/17 1200 01/31/17 1300 01/31/17 1400 01/31/17 1801  BP: (!) 142/92  111/68 127/83  Pulse: 60 81 66 75  Resp: 18 19 17 18   Temp: 97.9 F (36.6 C)   98.5 F (36.9 C)  TempSrc: Oral   Oral  SpO2: 97% 97% 96% 99%  Weight:      Height:        Intake/Output Summary (Last 24 hours) at 01/31/17 1824 Last data filed at 01/31/17 1500  Gross per 24 hour  Intake           1402.5 ml  Output              500 ml  Net            902.5 ml   Filed Weights   01/29/17 1840 01/31/17 0620  Weight: 68 kg (150 lb) 65.3 kg (143 lb 15.4 oz)    Examination:  General exam: comfortable.  Respiratory system: clear.  Cardiovascular  system s1s2 noraml.  Gastrointestinal system:abd soft and non tender.  Central nervous system: non focal.  Extremities: Symmetric 5 x 5 power. Skin: No rashes, lesions or ulcers Psychiatry: flat affect.     Data Reviewed: I have personally reviewed following labs and imaging studies  CBC:  Recent Labs Lab 01/28/17 1101 01/29/17 2023 01/30/17 0309 01/31/17 0318  WBC 8.3 10.2 7.5 6.2  NEUTROABS 4.1 7.3  --   --   HGB 14.2 14.2 12.6 12.3  HCT 42.9 41.7 38.7 37.3  MCV 86.7 86.9 88.4 87.4  PLT 348 331 258 423   Basic Metabolic Panel:  Recent Labs Lab 01/28/17 1101 01/29/17 2023 01/30/17 0309 01/31/17 0318  NA 138 139 136 136  K 3.4* 4.3 4.2 3.2*  CL 103 103 103 105  CO2 19* 22 24 22   GLUCOSE 77 86 69 90  BUN 10 9 10 6   CREATININE 0.73 0.65 0.64 0.52  CALCIUM 8.6* 9.3 8.5* 8.7*   GFR: Estimated Creatinine Clearance: 95.4 mL/min (by C-G formula based on SCr of 0.52 mg/dL). Liver Function Tests:  Recent Labs Lab 01/28/17 1101 01/29/17 2023  AST 81* 78*  ALT 50 54  ALKPHOS 83 82  BILITOT 0.5 0.8  PROT 7.7 8.0  ALBUMIN 4.2 4.2   No results for input(s): LIPASE, AMYLASE in the last 168 hours. No results for input(s): AMMONIA in the last 168 hours. Coagulation Profile: No results for input(s): INR, PROTIME in the last 168 hours. Cardiac Enzymes: No results for input(s): CKTOTAL, CKMB, CKMBINDEX, TROPONINI in the last 168 hours. BNP (last 3 results) No results for input(s): PROBNP in the last 8760 hours. HbA1C: No results for input(s): HGBA1C in the last 72 hours. CBG:  Recent Labs Lab 01/29/17 1848  GLUCAP 82   Lipid Profile: No results for input(s): CHOL, HDL, LDLCALC, TRIG, CHOLHDL, LDLDIRECT in the last 72 hours. Thyroid Function Tests: No results for input(s): TSH, T4TOTAL, FREET4, T3FREE, THYROIDAB in the last 72 hours. Anemia Panel: No results for input(s): VITAMINB12, FOLATE, FERRITIN, TIBC, IRON, RETICCTPCT in the last 72 hours. Sepsis  Labs: No results for input(s): PROCALCITON, LATICACIDVEN in the last 168 hours.  No results found for this or any previous visit (from the past 240 hour(s)).       Radiology Studies: No results found.      Scheduled Meds: . folic acid  1 mg Intravenous Daily  . potassium chloride  40 mEq Oral BID  . thiamine  100 mg Intravenous Daily   Continuous Infusions: . sodium chloride 75 mL/hr at 01/31/17 1500     LOS: 2 days    Time spent: 30 minutes    Ruth Kovich, MD [facility] Pager [phone]  If 7PM-7AM, please contact night-coverage [email or web address] Password [facility] 01/31/2017, 6:24 PM

## 2017-02-01 MED ORDER — SODIUM CHLORIDE 0.9 % IV SOLN
INTRAVENOUS | Status: DC
  Administered 2017-02-01 – 2017-02-02 (×3): via INTRAVENOUS

## 2017-02-01 MED ORDER — LORAZEPAM 2 MG/ML IJ SOLN
2.0000 mg | INTRAMUSCULAR | Status: DC | PRN
  Administered 2017-02-01 – 2017-02-02 (×5): 2 mg via INTRAVENOUS
  Filled 2017-02-01 (×4): qty 1

## 2017-02-01 NOTE — Progress Notes (Signed)
PROGRESS NOTE    Kristine Macias  [id number]:[id number] DOB: October 09, 1985 DOA: 01/29/2017 PCP: Leonides Sake, MD    Brief Narrative:Kristine Macias is a 31 y.o. female with history of alcohol abuse who was recently discharged after being treated for alcohol withdrawal was brought to the ER after patient had a witnessed seizure by patient's mother. Patient reports taking alcohol after being discharged from the hospital. She has received upto 23 mg of ativan in the last 24 hours. We have come down on the ativan dose from hourly to every 4 hours prn.   Assessment & Plan:   Principal Problem:   Alcohol withdrawal ([place]) Active Problems:   Withdrawal seizures ([place])   Seizures from alcohol withdrawal:  No seizures since admission. On CIWA protocol.  Continues to have some tremors in her arms and very anxious. Has received upto 23 mg of ativan in the last 24 hours, . Decreased the ativan from every hour to once every 4 hours as needed.   monitor for withdrawal symptoms.   Alcohol abuse; SW for resources Currently she is refusing any resources.  Patient wants to try campral after she is over the withdrawal symptoms. recommended to start with naltrexone. But she reports naltrexone is not helping her and would like to try Campral once she is discharged. She would be a better candidate to try Campral if naltrexone is not working.  Discussed with the patient regarding campral and told her that positive results were seen  for maintaining abstinence rather than reduction of drinking by non compliance. She would like to think about it before getting back on the medication.   Hypokalemia: replaced, repeat in am.      DVT prophylaxis: (scd's) Code Status: (Full) Family Communication: (none at bedside") Disposition Plan: home tomorrow if no withdrawal symptoms.    Consultants:   None.   Procedures: none.   Antimicrobials: (none.   Subjective: Continues to have tremors and is anxious     Objective: Vitals:   01/31/17 2116 02/01/17 0420 02/01/17 0518 02/01/17 0550  BP: (!) 131/95  105/68   Pulse: 85 80 76 78  Resp: 16  16   Temp: 98.3 F (36.8 C)  98.6 F (37 C)   TempSrc: Oral  Oral   SpO2: 100%  100%   Weight:      Height:        Intake/Output Summary (Last 24 hours) at 02/01/17 1412 Last data filed at 02/01/17 1000  Gross per 24 hour  Intake              555 ml  Output                0 ml  Net              555 ml   Filed Weights   01/29/17 1840 01/31/17 0620  Weight: 68 kg (150 lb) 65.3 kg (143 lb 15.4 oz)    Examination:  General exam: alert but anxious.  Respiratory system: clear. No wheezing or rhonchi Cardiovascular system s1s2  RRR, no murmers. Gastrointestinal system:abd soft and non tender. Bowel sounds heard.  Central nervous system: non focal. Alert and oriented.  Extremities: Symmetric 5 x 5 power. Skin: No rashes, lesions or ulcers Psychiatry: very anxious today.     Data Reviewed: I have personally reviewed following labs and imaging studies  CBC:  Recent Labs Lab 01/28/17 1101 01/29/17 2023 01/30/17 0309 01/31/17 0318  WBC 8.3 10.2 7.5 6.2  NEUTROABS 4.1 7.3  --   --   HGB 14.2 14.2 12.6 12.3  HCT 42.9 41.7 38.7 37.3  MCV 86.7 86.9 88.4 87.4  PLT 348 331 258 161   Basic Metabolic Panel:  Recent Labs Lab 01/28/17 1101 01/29/17 2023 01/30/17 0309 01/31/17 0318  NA 138 139 136 136  K 3.4* 4.3 4.2 3.2*  CL 103 103 103 105  CO2 19* 22 24 22   GLUCOSE 77 86 69 90  BUN 10 9 10 6   CREATININE 0.73 0.65 0.64 0.52  CALCIUM 8.6* 9.3 8.5* 8.7*   GFR: Estimated Creatinine Clearance: 95.4 mL/min (by C-G formula based on SCr of 0.52 mg/dL). Liver Function Tests:  Recent Labs Lab 01/28/17 1101 01/29/17 2023  AST 81* 78*  ALT 50 54  ALKPHOS 83 82  BILITOT 0.5 0.8  PROT 7.7 8.0  ALBUMIN 4.2 4.2   No results for input(s): LIPASE, AMYLASE in the last 168 hours. No results for input(s): AMMONIA in the last 168  hours. Coagulation Profile: No results for input(s): INR, PROTIME in the last 168 hours. Cardiac Enzymes: No results for input(s): CKTOTAL, CKMB, CKMBINDEX, TROPONINI in the last 168 hours. BNP (last 3 results) No results for input(s): PROBNP in the last 8760 hours. HbA1C: No results for input(s): HGBA1C in the last 72 hours. CBG:  Recent Labs Lab 01/29/17 1848  GLUCAP 82   Lipid Profile: No results for input(s): CHOL, HDL, LDLCALC, TRIG, CHOLHDL, LDLDIRECT in the last 72 hours. Thyroid Function Tests: No results for input(s): TSH, T4TOTAL, FREET4, T3FREE, THYROIDAB in the last 72 hours. Anemia Panel: No results for input(s): VITAMINB12, FOLATE, FERRITIN, TIBC, IRON, RETICCTPCT in the last 72 hours. Sepsis Labs: No results for input(s): PROCALCITON, LATICACIDVEN in the last 168 hours.  No results found for this or any previous visit (from the past 240 hour(s)).       Radiology Studies: No results found.      Scheduled Meds: . folic acid  1 mg Intravenous Daily  . thiamine  100 mg Intravenous Daily   Continuous Infusions:    LOS: 3 days    Time spent: 30 minutes    Kristine Hallett, MD [facility] Pager [phone]  If 7PM-7AM, please contact night-coverage [email or web address] Password [email or web address] 02/01/2017, 2:12 PM

## 2017-02-01 NOTE — Progress Notes (Signed)
Date:  February 01, 2017  Chart reviewed for concurrent status and case management needs.  Will continue to follow patient progress.  Principal Problem:   Alcohol withdrawal ([place]) Active Problems:   Withdrawal seizures ([place])   Seizures from alcohol withdrawal:  No seizures since admission. On CIWA protocol.  Has some tremors in her arms and very anxious.  Would recommend to continue with IV fluids and monitor for withdrawal symptoms.   Alcohol abuse; SW for resources Currently she is refusing any resources.  Discharge Planning: following for needs  Expected discharge date: [id number]  Velva Harman, BSN, [place], [place]

## 2017-02-01 NOTE — Clinical Social Work Note (Signed)
Clinical Social Work Assessment  Patient Details  Name: Kristine Macias MRN: [id number] Date of Birth: 1986-01-21  Date of referral:  01/30/17               Reason for consult:  Substance Use/ETOH Abuse                Permission sought to share information with:    Permission granted to share information::     Name::        Agency::     Relationship::     Contact Information:     Housing/Transportation Living arrangements for the past 2 months:  Single Family Home Source of Information:  Patient Patient Interpreter Needed:  None Criminal Activity/Legal Involvement Pertinent to Current Situation/Hospitalization:  No - Comment as needed Significant Relationships:  Other Family Members, Parents, Dependent Children Lives with:  Minor Children, Parents, Significant Other Do you feel safe going back to the place where you live?  Yes Need for family participation in patient care:  No (Coment)  Care giving concerns:  History of substance abuse   Facilities manager / plan:  CSW met with pt and introduced role of CSW.  Pt explained that she lives with her mother, fiance and 3 children (69, 8 and 48mo).  Pt stated she has been abusing alcohol for approximately 4 years.  She stated she received in home counseling twice/week.  Pt stated she feels that the services she is receiving are sufficient and was not interested in receiving information about other resources.  Employment status:  Unemployed IForensic scientist  [place]  Not assessed at this time Information / Referral to community resources:     Patient/Family's Response to care:  Pt was appreciative of CSW assistance.  Patient/Family's Understanding of and Emotional Response to Diagnosis, Current Treatment, and Prognosis:  Pt acknowledged that she is currently experiencing a lot of stress.  She stated she only drinks when she is feelings stressed.  Pt stated she plans to continue the in home  counseling she is currently receiving.  Emotional Assessment Appearance:  Appears stated age Attitude/Demeanor/Rapport:  Sedated, Lethargic Affect (typically observed):  Flat, Pleasant Orientation:  Oriented to Self, Oriented to Place, Oriented to  Time, Oriented to Situation Alcohol / Substance use:  Alcohol Use Psych involvement (Current and /or in the community):  No (Comment)  Discharge Needs  Concerns to be addressed:  No discharge needs identified Readmission within the last 30 days:  Yes Current discharge risk:  Substance Abuse Barriers to Discharge:  No Barriers Identified   WNaida Sleight LCSW 02/01/2017, 10:18 AM

## 2017-02-02 LAB — POTASSIUM: Potassium: 3.7 mmol/L (ref 3.5–5.1)

## 2017-02-02 MED ORDER — LORAZEPAM 2 MG/ML IJ SOLN
1.0000 mg | Freq: Four times a day (QID) | INTRAMUSCULAR | Status: DC | PRN
  Administered 2017-02-03 (×2): 1 mg via INTRAVENOUS
  Filled 2017-02-02 (×2): qty 1

## 2017-02-02 MED ORDER — LORAZEPAM 2 MG/ML IJ SOLN
1.0000 mg | Freq: Four times a day (QID) | INTRAMUSCULAR | Status: DC | PRN
  Administered 2017-02-03: 2 mg via INTRAVENOUS
  Filled 2017-02-02 (×2): qty 1

## 2017-02-02 MED ORDER — LORAZEPAM 2 MG/ML IJ SOLN
2.0000 mg | INTRAMUSCULAR | Status: DC | PRN
  Administered 2017-02-02: 2 mg via INTRAVENOUS
  Filled 2017-02-02 (×2): qty 1

## 2017-02-02 NOTE — Progress Notes (Signed)
PROGRESS NOTE    [name]  PJA:[id number] DOB: 1986/03/09 DOA: 01/29/2017 PCP: Leonides Sake, MD    Brief Narrative:Kristine Macias is a 31 y.o. female with history of alcohol abuse who was recently discharged after being treated for alcohol withdrawal was brought to the ER after patient had a witnessed seizure by patient's mother. Patient reports taking alcohol after being discharged from the hospital. She has received upto 23 mg of ativan on 6/8,    Assessment & Plan:   Principal Problem:   Alcohol withdrawal ([place]) Active Problems:   Withdrawal seizures ([place])   Seizures from alcohol withdrawal:  No seizures since admission. On CIWA protocol.  Continues to have some tremors in her arms and remains anxious, she received up to 23 mg of ativan on 6/8, 10 mg of ativan in the last 24 hours. As per the nursing staff, the patient is voluntarily making her arms tremulous and requesting for ativan. I changed the frequency of ativan from every 4 to every 6 hours prn. We will plan for discharge in am.   monitor for seizures.   Alcohol abuse; SW for resources Currently she is refusing any resources.  Patient wants to try campral after she is over the withdrawal symptoms. recommended to start with naltrexone. But she reports naltrexone is not helping her and would like to try Campral once she is discharged. She would be a better candidate to try Campral if naltrexone is not working.  Discussed with the patient regarding campral and told her that positive results were seen  for maintaining abstinence rather than reduction of drinking by non compliance. She would like to think about it before getting back on the medication.   Hypokalemia: replaced, repeat in am show improvement.     Disposition:  Ambulate her out of bed.     DVT prophylaxis: (scd's) Code Status: (Full) Family Communication: (none at bedside") Disposition Plan: home tomorrow if no withdrawal symptoms.     Consultants:   None.   Procedures: none.   Antimicrobials: (none.   Subjective: Continues to have tremors , unclear if she is doing it purposefully.   Objective: Vitals:   02/02/17 0838 02/02/17 1028 02/02/17 1235 02/02/17 1430  BP: 98/66 93/71 109/67 108/73  Pulse: 66 62 96 90  Resp: 16   16  Temp: 98.6 F (37 C)   98.9 F (37.2 C)  TempSrc:      SpO2: 98% 99%  99%  Weight:      Height:        Intake/Output Summary (Last 24 hours) at 02/02/17 1616 Last data filed at 02/02/17 1300  Gross per 24 hour  Intake             1380 ml  Output                0 ml  Net             1380 ml   Filed Weights   01/29/17 1840 01/31/17 0620  Weight: 68 kg (150 lb) 65.3 kg (143 lb 15.4 oz)    Examination:  General exam: alert but anxious. Almost to tears when I talked to her about discharge Respiratory system: No wheezing or rhonchi. Clear to auscultation.  Cardiovascular system s1s2  RRR, no murmers, rubs or gallops.  Gastrointestinal system:abd soft and non tender. BS normal.  Central nervous system: non focal. Alert and oriented.  Extremities: Symmetric 5 x 5 power. Abel to move all extremities.  Skin: No rashes, lesions or ulcers Psychiatry: anxious,     Data Reviewed: I have personally reviewed following labs and imaging studies  CBC:  Recent Labs Lab 01/28/17 1101 01/29/17 2023 01/30/17 0309 01/31/17 0318  WBC 8.3 10.2 7.5 6.2  NEUTROABS 4.1 7.3  --   --   HGB 14.2 14.2 12.6 12.3  HCT 42.9 41.7 38.7 37.3  MCV 86.7 86.9 88.4 87.4  PLT 348 331 258 119   Basic Metabolic Panel:  Recent Labs Lab 01/28/17 1101 01/29/17 2023 01/30/17 0309 01/31/17 0318 02/02/17 0744  NA 138 139 136 136  --   K 3.4* 4.3 4.2 3.2* 3.7  CL 103 103 103 105  --   CO2 19* 22 24 22   --   GLUCOSE 77 86 69 90  --   BUN 10 9 10 6   --   CREATININE 0.73 0.65 0.64 0.52  --   CALCIUM 8.6* 9.3 8.5* 8.7*  --    GFR: Estimated Creatinine Clearance: 95.4 mL/min (by C-G formula  based on SCr of 0.52 mg/dL). Liver Function Tests:  Recent Labs Lab 01/28/17 1101 01/29/17 2023  AST 81* 78*  ALT 50 54  ALKPHOS 83 82  BILITOT 0.5 0.8  PROT 7.7 8.0  ALBUMIN 4.2 4.2   No results for input(s): LIPASE, AMYLASE in the last 168 hours. No results for input(s): AMMONIA in the last 168 hours. Coagulation Profile: No results for input(s): INR, PROTIME in the last 168 hours. Cardiac Enzymes: No results for input(s): CKTOTAL, CKMB, CKMBINDEX, TROPONINI in the last 168 hours. BNP (last 3 results) No results for input(s): PROBNP in the last 8760 hours. HbA1C: No results for input(s): HGBA1C in the last 72 hours. CBG:  Recent Labs Lab 01/29/17 1848  GLUCAP 82   Lipid Profile: No results for input(s): CHOL, HDL, LDLCALC, TRIG, CHOLHDL, LDLDIRECT in the last 72 hours. Thyroid Function Tests: No results for input(s): TSH, T4TOTAL, FREET4, T3FREE, THYROIDAB in the last 72 hours. Anemia Panel: No results for input(s): VITAMINB12, FOLATE, FERRITIN, TIBC, IRON, RETICCTPCT in the last 72 hours. Sepsis Labs: No results for input(s): PROCALCITON, LATICACIDVEN in the last 168 hours.  No results found for this or any previous visit (from the past 240 hour(s)).       Radiology Studies: No results found.      Scheduled Meds: . folic acid  1 mg Intravenous Daily  . thiamine  100 mg Intravenous Daily   Continuous Infusions: . sodium chloride 75 mL/hr at 02/02/17 0945     LOS: 4 days    Time spent: 49 minutes    Sabri Teal, MD [facility] Pager [phone]  If 7PM-7AM, please contact night-coverage [email or web address] Password [email or web address] 02/02/2017, 4:16 PM

## 2017-02-03 MED ORDER — LORAZEPAM 1 MG PO TABS: 1.0000 mg | ORAL_TABLET | Freq: Three times a day (TID) | ORAL | 0 refills | Status: DC | PRN

## 2017-02-04 NOTE — Discharge Summary (Signed)
Physician Discharge Summary  Kristine Macias NTI:[id number] DOB: 1986/07/24 DOA: 01/29/2017  PCP: Leonides Sake, MD  Admit date: 01/29/2017 Discharge date: 02/03/2017  Admitted From: Home. Disposition: Home.   Recommendations for Outpatient Follow-up:  1. Follow up with PCP in 1-2 weeks 2. Please obtain BMP/CBC in one week 3. Please follow up psychiatry and start your alcohol abstinence meds as recommended.     Discharge Condition:stable.  CODE STATUS:full code Diet recommendation: Heart Healthy  Brief/Interim Summary: Kristine Eilers Johnstonis a 31 y.o.femalewith history of alcohol abuse who was recently discharged after being treated for alcohol withdrawal was brought to the ER after patient had a witnessed seizure by patient's mother. Patient reports taking alcohol after being discharged from the hospital. On the day of discharge her symptoms have improved and she has received 5 mg of ativan in the last 24 hours. She will be discharged or oral ativan till she sees her PCP.   Discharge Diagnoses:  Principal Problem:   Alcohol withdrawal ([place]) Active Problems:   Withdrawal seizures ([place])  Seizures from alcohol withdrawal:  No seizures since admission. On CIWA protocol.  Continues to have some tremors in her arms and remains anxious, she received up to 23 mg of ativan on 6/8, followed by 10 mg on 6/9 and she received 4 to 5 mg on 6/10. She was discharged on oral ativan till she sees PCP.    Alcohol abuse; SW for resources Currently she is refusing any resources.  Patient wants to try campral after she is over the withdrawal symptoms. recommended to start with naltrexone. But she reports naltrexone is not helping her and would like to try Campral once she is discharged. She would be a better candidate to try Campral if naltrexone is not working.  Discussed with the patient regarding campral and told her that positive results were seen  for maintaining abstinence rather than  reduction of drinking by non compliance. She would like to think about it before getting back on the medication.   Hypokalemia: replaced, repeat in am show improvement.        Discharge Instructions  Discharge Instructions    Diet general    Complete by:  As directed    Discharge instructions    Complete by:  As directed    Please follow up with PCP in tomorrow.     Allergies as of 02/03/2017      Reactions   Aspirin Shortness Of Breath   Effexor [venlafaxine] Anaphylaxis   Gabapentin Anaphylaxis   Sulfa Antibiotics Anaphylaxis   Tetracyclines & Related Anaphylaxis   Trazodone And Nefazodone Anaphylaxis   Latex Hives, Itching   Paxil [paroxetine] Hives, Itching   Seroquel [quetiapine Fumarate] Hives, Itching   Zoloft [sertraline Hcl] Hives, Itching      Medication List    STOP taking these medications   clonazePAM 0.5 MG tablet Commonly known as:  KLONOPIN     TAKE these medications   chlordiazePOXIDE 25 MG capsule Commonly known as:  LIBRIUM 64m PO TID x 1D, then 25-516mPO BID X 1D, then 25-5077mO QD X 1D   LORazepam 1 MG tablet Commonly known as:  ATIVAN Take 1 tablet (1 mg total) by mouth every 8 (eight) hours as needed for anxiety. What changed:  medication strength  how much to take  how to take this  when to take this  reasons to take this  additional instructions   Melatonin 1 MG Caps Take 1 mg by mouth at  bedtime as needed. sleep   multivitamin with minerals Tabs tablet Take 1 tablet by mouth daily.   ondansetron 4 MG disintegrating tablet Commonly known as:  ZOFRAN-ODT Take 4 mg by mouth every 8 (eight) hours as needed for nausea or vomiting.   thiamine 100 MG tablet Take 1 tablet (100 mg total) by mouth daily.     ASK your doctor about these medications   busPIRone 5 MG tablet Commonly known as:  BUSPAR Take 1 tablet (5 mg total) by mouth 3 (three) times daily. Ask about: Should I take this medication?      Follow-up  Information    Hamrick, Lorin Mercy, MD. Schedule an appointment as soon as possible for a visit in 1 week(s).   Specialty:  Family Medicine Why:  follow up tomorrow with PCP , post hospitalization visit, will need referral for psychiatry.  Contact information: [place] [address] [phone]          Allergies  Allergen Reactions  . Aspirin Shortness Of Breath  . Effexor [Venlafaxine] Anaphylaxis  . Gabapentin Anaphylaxis  . Sulfa Antibiotics Anaphylaxis  . Tetracyclines & Related Anaphylaxis  . Trazodone And Nefazodone Anaphylaxis  . Latex Hives and Itching  . Paxil [Paroxetine] Hives and Itching  . Seroquel [Quetiapine Fumarate] Hives and Itching  . Zoloft [Sertraline Hcl] Hives and Itching     Consultations:  None.    Procedures/Studies: Ct Head Wo Contrast  Result Date: 01/15/2017 CLINICAL DATA:  Two seizures on 01/14/2017, possibly due to alcohol withdrawal. EXAM: CT HEAD WITHOUT CONTRAST TECHNIQUE: Contiguous axial images were obtained from the base of the skull through the vertex without intravenous contrast. COMPARISON:  MRI brain 11/12/2016.  CT head 09/21/2016 FINDINGS: Brain: No evidence of acute infarction, hemorrhage, hydrocephalus, extra-axial collection or mass lesion/mass effect. Vascular: No hyperdense vessel or unexpected calcification. Skull: Normal. Negative for fracture or focal lesion. Sinuses/Orbits: No acute finding. Other: None. IMPRESSION: No acute intracranial abnormalities. Electronically Signed   By: Lucienne Capers M.D.   On: 01/15/2017 02:58       Subjective: No chest pain, or sob, nausea, vomiting or minimal tremors.   Discharge Exam: Vitals:   02/02/17 2123 02/03/17 0512  BP: 110/75 101/69  Pulse: 79 72  Resp: 16 14  Temp: 98.7 F (37.1 C) 99.1 F (37.3 C)   Vitals:   02/02/17 1430 02/02/17 1740 02/02/17 2123 02/03/17 0512  BP: 108/73 108/79 110/75 101/69  Pulse: 90 99 79 72  Resp: 16 18 16 14   Temp:  98.9 F (37.2 C) 98.7 F (37.1 C) 98.7 F (37.1 C) 99.1 F (37.3 C)  TempSrc:   Oral Oral  SpO2: 99% 99% 98% 98%  Weight:      Height:        General: Pt is alert, awake, not in acute distress Cardiovascular: RRR, S1/S2 +, no rubs, no gallops Respiratory: CTA bilaterally, no wheezing, no rhonchi Abdominal: Soft, NT, ND, bowel sounds + Extremities: no edema, no cyanosis    The results of significant diagnostics from this hospitalization (including imaging, microbiology, ancillary and laboratory) are listed below for reference.     Microbiology: No results found for this or any previous visit (from the past 240 hour(s)).   Labs: BNP (last 3 results) No results for input(s): BNP in the last 8760 hours. Basic Metabolic Panel:  Recent Labs Lab 01/28/17 1101 01/29/17 2023 01/30/17 0309 01/31/17 0318 02/02/17 0744  NA 138 139 136 136  --  K 3.4* 4.3 4.2 3.2* 3.7  CL 103 103 103 105  --   CO2 19* 22 24 22   --   GLUCOSE 77 86 69 90  --   BUN 10 9 10 6   --   CREATININE 0.73 0.65 0.64 0.52  --   CALCIUM 8.6* 9.3 8.5* 8.7*  --    Liver Function Tests:  Recent Labs Lab 01/28/17 1101 01/29/17 2023  AST 81* 78*  ALT 50 54  ALKPHOS 83 82  BILITOT 0.5 0.8  PROT 7.7 8.0  ALBUMIN 4.2 4.2   No results for input(s): LIPASE, AMYLASE in the last 168 hours. No results for input(s): AMMONIA in the last 168 hours. CBC:  Recent Labs Lab 01/28/17 1101 01/29/17 2023 01/30/17 0309 01/31/17 0318  WBC 8.3 10.2 7.5 6.2  NEUTROABS 4.1 7.3  --   --   HGB 14.2 14.2 12.6 12.3  HCT 42.9 41.7 38.7 37.3  MCV 86.7 86.9 88.4 87.4  PLT 348 331 258 232   Cardiac Enzymes: No results for input(s): CKTOTAL, CKMB, CKMBINDEX, TROPONINI in the last 168 hours. BNP: Invalid input(s): POCBNP CBG:  Recent Labs Lab 01/29/17 1848  GLUCAP 82   D-Dimer No results for input(s): DDIMER in the last 72 hours. Hgb A1c No results for input(s): HGBA1C in the last 72 hours. Lipid  Profile No results for input(s): CHOL, HDL, LDLCALC, TRIG, CHOLHDL, LDLDIRECT in the last 72 hours. Thyroid function studies No results for input(s): TSH, T4TOTAL, T3FREE, THYROIDAB in the last 72 hours.  Invalid input(s): FREET3 Anemia work up No results for input(s): VITAMINB12, FOLATE, FERRITIN, TIBC, IRON, RETICCTPCT in the last 72 hours. Urinalysis    Component Value Date/Time   COLORURINE YELLOW 01/28/2017 1125   APPEARANCEUR CLEAR 01/28/2017 1125   APPEARANCEUR Clear 02/15/2016 1456   LABSPEC 1.004 (L) 01/28/2017 1125   LABSPEC 1.006 12/20/2014 1018   PHURINE 7.0 01/28/2017 1125   GLUCOSEU NEGATIVE 01/28/2017 1125   GLUCOSEU Negative 12/20/2014 1018   HGBUR NEGATIVE 01/28/2017 1125   BILIRUBINUR NEGATIVE 01/28/2017 1125   BILIRUBINUR neg 03/13/2016 1700   BILIRUBINUR Negative 02/15/2016 1456   BILIRUBINUR Negative 12/20/2014 1018   KETONESUR NEGATIVE 01/28/2017 1125   PROTEINUR NEGATIVE 01/28/2017 1125   UROBILINOGEN negative 03/13/2016 1700   UROBILINOGEN 0.2 06/13/2012 2306   NITRITE NEGATIVE 01/28/2017 1125   LEUKOCYTESUR TRACE (A) 01/28/2017 1125   LEUKOCYTESUR Negative 02/15/2016 1456   LEUKOCYTESUR Negative 12/20/2014 1018   Sepsis Labs Invalid input(s): PROCALCITONIN,  WBC,  LACTICIDVEN Microbiology No results found for this or any previous visit (from the past 240 hour(s)).   Time coordinating discharge: Over 30 minutes  SIGNED:   Hosie Poisson, MD  [facility] 02/04/2017, 7:37 AM Pager   If 7PM-7AM, please contact night-coverage [email or web address] Password [email or web address]

## 2017-02-15 NOTE — Discharge Summary (Signed)
Physician Discharge Summary  Kortne All UDJ:[id number] DOB: June 03, 1986 DOA: 01/14/2017  PCP: Leonides Sake, MD  Admit date: 01/14/2017 Discharge date: 02/15/2017  Recommendations for Outpatient Follow-up:  1. Pt left AMA  Discharge Diagnoses:  Principal Problem:   Alcohol withdrawal ([place]) Active Problems:   Withdrawal seizures ([place])  Discharge Condition: left AM  Diet recommendation:   Brief Narrative:  Pt is 31 yo female with known alcohol abuse who presented to [facility] after having two episodes of generalized tonic clonic seizures witnessed by her husband.   Major events since admission:  5/24 - patient more alert this morning, able to participate physical exam, still on Ativan as needed  Assessment & Plan:  Principal Problem: Alcohol withdrawal (H[facility]),Withdrawal seizures ([place]) - has been improving but apparently left AMA in the evening hours   Alcohol intoxication, alcohol abuse  - elevated alcohol level on admission - cessation consultation provided  ? UTI - on Rocephin but apparently has not gotten it, ? Pt refused  Procedures/Studies:  No results found.  Discharge Exam: Vitals:   01/17/17 2200 01/18/17 0000  BP: (!) 121/91 (!) 139/101  Pulse:  100  Resp: 16 18  Temp:     Vitals:   01/17/17 1953 01/17/17 2000 01/17/17 2200 01/18/17 0000  BP:  (!) 124/94 (!) 121/91 (!) 139/101  Pulse:  96  100  Resp:  (!) 22 16 18   Temp: 98.5 F (36.9 C)     TempSrc: Oral     SpO2:  95% 96% 94%  Weight:      Height:        Physical exam not done as pt left AMA  Discharge Instructions   Allergies as of 01/18/2017      Reactions   Aspirin Shortness Of Breath   Effexor [venlafaxine] Anaphylaxis   Gabapentin Anaphylaxis   Sulfa Antibiotics Anaphylaxis   Tetracyclines & Related Anaphylaxis   Trazodone And Nefazodone Anaphylaxis   Latex Hives, Itching   Paxil [paroxetine] Hives, Itching   Seroquel [quetiapine Fumarate] Hives, Itching   Zoloft  [sertraline Hcl] Hives, Itching      Medication List    ASK your doctor about these medications   Melatonin 1 MG Caps Take 1 mg by mouth at bedtime as needed. sleep   thiamine 100 MG tablet Take 1 tablet (100 mg total) by mouth daily.         The results of significant diagnostics from this hospitalization (including imaging, microbiology, ancillary and laboratory) are listed below for reference.     Microbiology: No results found for this or any previous visit (from the past 240 hour(s)).   Labs: Basic Metabolic Panel: No results for input(s): NA, K, CL, CO2, GLUCOSE, BUN, CREATININE, CALCIUM, MG, PHOS in the last 168 hours. Liver Function Tests: No results for input(s): AST, ALT, ALKPHOS, BILITOT, PROT, ALBUMIN in the last 168 hours. No results for input(s): LIPASE, AMYLASE in the last 168 hours. No results for input(s): AMMONIA in the last 168 hours. CBC: No results for input(s): WBC, NEUTROABS, HGB, HCT, MCV, PLT in the last 168 hours. Cardiac Enzymes: No results for input(s): CKTOTAL, CKMB, CKMBINDEX, TROPONINI in the last 168 hours. BNP: BNP (last 3 results) No results for input(s): BNP in the last 8760 hours.  ProBNP (last 3 results) No results for input(s): PROBNP in the last 8760 hours.    SIGNED: Time coordinating discharge: pt left AM  Faye Ramsay, MD  [facility] 02/15/2017, 10:22 PM Pager [phone]  If  7PM-7AM, please contact night-coverage [email or web address] Password [email or web address]

## 2017-02-16 ENCOUNTER — Inpatient Hospital Stay (HOSPITAL_COMMUNITY)
Admission: EM | Admit: 2017-02-16 | Discharge: 2017-02-22 | DRG: 897 | Disposition: A | Discharge status: Home / Self Care | Payer: Medicaid Other | Attending: Family Medicine | Admitting: Family Medicine

## 2017-02-16 ENCOUNTER — Encounter (HOSPITAL_COMMUNITY): Payer: Self-pay | Admitting: Emergency Medicine

## 2017-02-16 DIAGNOSIS — G40909 Epilepsy, unspecified, not intractable, without status epilepticus: Secondary | ICD-10-CM | POA: Diagnosis present

## 2017-02-16 DIAGNOSIS — F19930 Other psychoactive substance use, unspecified with withdrawal, uncomplicated: Secondary | ICD-10-CM

## 2017-02-16 DIAGNOSIS — F329 Major depressive disorder, single episode, unspecified: Secondary | ICD-10-CM | POA: Diagnosis present

## 2017-02-16 DIAGNOSIS — F10239 Alcohol dependence with withdrawal, unspecified: Secondary | ICD-10-CM | POA: Diagnosis present

## 2017-02-16 DIAGNOSIS — F19230 Other psychoactive substance dependence with withdrawal, uncomplicated: Secondary | ICD-10-CM

## 2017-02-16 DIAGNOSIS — H9042 Sensorineural hearing loss, unilateral, left ear, with unrestricted hearing on the contralateral side: Secondary | ICD-10-CM | POA: Diagnosis present

## 2017-02-16 DIAGNOSIS — K760 Fatty (change of) liver, not elsewhere classified: Secondary | ICD-10-CM | POA: Diagnosis present

## 2017-02-16 DIAGNOSIS — F411 Generalized anxiety disorder: Secondary | ICD-10-CM | POA: Diagnosis present

## 2017-02-16 DIAGNOSIS — Z886 Allergy status to analgesic agent status: Secondary | ICD-10-CM

## 2017-02-16 DIAGNOSIS — R740 Nonspecific elevation of levels of transaminase and lactic acid dehydrogenase [LDH]: Secondary | ICD-10-CM | POA: Diagnosis present

## 2017-02-16 DIAGNOSIS — Z882 Allergy status to sulfonamides status: Secondary | ICD-10-CM | POA: Diagnosis not present

## 2017-02-16 DIAGNOSIS — Z888 Allergy status to other drugs, medicaments and biological substances status: Secondary | ICD-10-CM

## 2017-02-16 DIAGNOSIS — E876 Hypokalemia: Secondary | ICD-10-CM | POA: Diagnosis present

## 2017-02-16 DIAGNOSIS — F10939 Alcohol use, unspecified with withdrawal, unspecified: Secondary | ICD-10-CM | POA: Diagnosis present

## 2017-02-16 DIAGNOSIS — Z79899 Other long term (current) drug therapy: Secondary | ICD-10-CM | POA: Diagnosis not present

## 2017-02-16 DIAGNOSIS — Z881 Allergy status to other antibiotic agents status: Secondary | ICD-10-CM | POA: Diagnosis not present

## 2017-02-16 DIAGNOSIS — Y907 Blood alcohol level of 200-239 mg/100 ml: Secondary | ICD-10-CM | POA: Diagnosis present

## 2017-02-16 DIAGNOSIS — R569 Unspecified convulsions: Secondary | ICD-10-CM

## 2017-02-16 DIAGNOSIS — F17210 Nicotine dependence, cigarettes, uncomplicated: Secondary | ICD-10-CM | POA: Diagnosis present

## 2017-02-16 DIAGNOSIS — F10230 Alcohol dependence with withdrawal, uncomplicated: Secondary | ICD-10-CM | POA: Diagnosis not present

## 2017-02-16 DIAGNOSIS — Z91040 Latex allergy status: Secondary | ICD-10-CM

## 2017-02-16 DIAGNOSIS — Z853 Personal history of malignant neoplasm of breast: Secondary | ICD-10-CM

## 2017-02-16 DIAGNOSIS — F1010 Alcohol abuse, uncomplicated: Secondary | ICD-10-CM | POA: Diagnosis present

## 2017-02-16 DIAGNOSIS — R112 Nausea with vomiting, unspecified: Secondary | ICD-10-CM | POA: Diagnosis present

## 2017-02-16 DIAGNOSIS — Z720 Tobacco use: Secondary | ICD-10-CM | POA: Diagnosis not present

## 2017-02-16 LAB — COMPREHENSIVE METABOLIC PANEL
ALT: 77 U/L — ABNORMAL HIGH (ref 14–54)
ANION GAP: 16 — AB (ref 5–15)
AST: 119 U/L — ABNORMAL HIGH (ref 15–41)
Albumin: 4.4 g/dL (ref 3.5–5.0)
Alkaline Phosphatase: 88 U/L (ref 38–126)
BUN: 12 mg/dL (ref 6–20)
CHLORIDE: 96 mmol/L — AB (ref 101–111)
CO2: 22 mmol/L (ref 22–32)
Calcium: 9.2 mg/dL (ref 8.9–10.3)
Creatinine, Ser: 0.67 mg/dL (ref 0.44–1.00)
GFR calc non Af Amer: >60 mL/min (ref >60)
Glucose, Bld: 93 mg/dL (ref 65–99)
POTASSIUM: 4.1 mmol/L (ref 3.5–5.1)
SODIUM: 134 mmol/L — AB (ref 135–145)
Total Bilirubin: 0.8 mg/dL (ref 0.3–1.2)
Total Protein: 8.2 g/dL — ABNORMAL HIGH (ref 6.5–8.1)

## 2017-02-16 LAB — RAPID URINE DRUG SCREEN, HOSP PERFORMED
AMPHETAMINES: NOT DETECTED
BARBITURATES: NOT DETECTED
BENZODIAZEPINES: POSITIVE — AB
COCAINE: NOT DETECTED
Opiates: NOT DETECTED
Tetrahydrocannabinol: NOT DETECTED

## 2017-02-16 LAB — CBC
HEMATOCRIT: 45.1 % (ref 36.0–46.0)
Hemoglobin: 15.6 g/dL — ABNORMAL HIGH (ref 12.0–15.0)
MCH: 29.3 pg (ref 26.0–34.0)
MCHC: 34.6 g/dL (ref 30.0–36.0)
MCV: 84.8 fL (ref 78.0–100.0)
PLATELETS: 342 10*3/uL (ref 150–400)
RBC: 5.32 MIL/uL — AB (ref 3.87–5.11)
RDW: 15.2 % (ref 11.5–15.5)
WBC: 6.4 10*3/uL (ref 4.0–10.5)

## 2017-02-16 LAB — HEMOGLOBIN AND HEMATOCRIT, BLOOD
HCT: 43.7 % (ref 36.0–46.0)
HEMOGLOBIN: 15.1 g/dL — AB (ref 12.0–15.0)

## 2017-02-16 LAB — PHOSPHORUS: Phosphorus: 3.2 mg/dL (ref 2.5–4.6)

## 2017-02-16 LAB — ETHANOL: ALCOHOL ETHYL (B): 208 mg/dL — AB (ref <5)

## 2017-02-16 LAB — MAGNESIUM: Magnesium: 1.8 mg/dL (ref 1.7–2.4)

## 2017-02-16 LAB — HCG, QUANTITATIVE, PREGNANCY

## 2017-02-16 MED ORDER — SODIUM CHLORIDE 0.9 % IV BOLUS (SEPSIS)
1000.0000 mL | Freq: Once | INTRAVENOUS | Status: AC
  Administered 2017-02-16: 1000 mL via INTRAVENOUS

## 2017-02-16 MED ORDER — LORAZEPAM 2 MG/ML IJ SOLN
2.0000 mg | Freq: Once | INTRAMUSCULAR | Status: AC
  Administered 2017-02-16: 1 mg via INTRAVENOUS
  Filled 2017-02-16: qty 1

## 2017-02-16 MED ORDER — LORAZEPAM 2 MG/ML IJ SOLN
4.0000 mg | Freq: Once | INTRAMUSCULAR | Status: AC
  Administered 2017-02-16: 4 mg via INTRAVENOUS
  Filled 2017-02-16: qty 2

## 2017-02-16 MED ORDER — ADULT MULTIVITAMIN W/MINERALS CH
1.0000 | ORAL_TABLET | Freq: Every day | ORAL | Status: DC
  Administered 2017-02-16 – 2017-02-22 (×7): 1 via ORAL
  Filled 2017-02-16 (×8): qty 1

## 2017-02-16 MED ORDER — ONDANSETRON HCL 4 MG/2ML IJ SOLN
4.0000 mg | Freq: Once | INTRAMUSCULAR | Status: AC
  Administered 2017-02-16: 4 mg via INTRAVENOUS
  Filled 2017-02-16: qty 2

## 2017-02-16 MED ORDER — LORAZEPAM 2 MG/ML IJ SOLN
1.0000 mg | Freq: Four times a day (QID) | INTRAMUSCULAR | Status: DC | PRN
  Administered 2017-02-17 (×3): 1 mg via INTRAVENOUS
  Filled 2017-02-16 (×3): qty 1

## 2017-02-16 MED ORDER — MAGNESIUM SULFATE 2 GM/50ML IV SOLN
2.0000 g | Freq: Once | INTRAVENOUS | Status: AC
  Administered 2017-02-16: 2 g via INTRAVENOUS
  Filled 2017-02-16: qty 50

## 2017-02-16 MED ORDER — LORAZEPAM 1 MG PO TABS: 1.0000 mg | ORAL_TABLET | Freq: Four times a day (QID) | ORAL | Status: DC | PRN

## 2017-02-16 MED ORDER — LORAZEPAM 2 MG/ML IJ SOLN
2.0000 mg | Freq: Once | INTRAMUSCULAR | Status: AC
  Administered 2017-02-16: 2 mg via INTRAVENOUS
  Filled 2017-02-16: qty 1

## 2017-02-16 MED ORDER — PANTOPRAZOLE SODIUM 40 MG IV SOLR
40.0000 mg | Freq: Two times a day (BID) | INTRAVENOUS | Status: DC
  Administered 2017-02-16 – 2017-02-18 (×4): 40 mg via INTRAVENOUS
  Filled 2017-02-16 (×5): qty 40

## 2017-02-16 MED ORDER — THIAMINE HCL 100 MG/ML IJ SOLN
100.0000 mg | Freq: Every day | INTRAMUSCULAR | Status: DC
  Administered 2017-02-16: 100 mg via INTRAVENOUS
  Filled 2017-02-16: qty 2

## 2017-02-16 MED ORDER — DEXTROSE-NACL 5-0.9 % IV SOLN
INTRAVENOUS | Status: DC
  Administered 2017-02-17 – 2017-02-18 (×3): via INTRAVENOUS

## 2017-02-16 MED ORDER — FOLIC ACID 1 MG PO TABS
1.0000 mg | ORAL_TABLET | Freq: Every day | ORAL | Status: DC
  Administered 2017-02-16 – 2017-02-22 (×7): 1 mg via ORAL
  Filled 2017-02-16 (×7): qty 1

## 2017-02-16 MED ORDER — LORAZEPAM 2 MG/ML IJ SOLN: 1.0000 mg | Freq: Once | INTRAMUSCULAR | Status: AC

## 2017-02-16 MED ORDER — VITAMIN B-1 100 MG PO TABS
100.0000 mg | ORAL_TABLET | Freq: Every day | ORAL | Status: DC
  Administered 2017-02-17 – 2017-02-22 (×6): 100 mg via ORAL
  Filled 2017-02-16 (×7): qty 1

## 2017-02-16 NOTE — ED Notes (Signed)
Report called to Altha Harm, RN on 5E

## 2017-02-16 NOTE — ED Notes (Signed)
Pt verbalizes nausea with three episodes of vomiting, pins and needles to hands and feet, blurred vision, and severe headache like it is going to burst. Pt calm without objective signs of anxiety or agitation, mild tremor noted with extension, pt alert and oriented.

## 2017-02-16 NOTE — ED Notes (Signed)
Pt verbalizes "my head hurts; my feet feels like pins and needles like someone sat on them for a long time; I have a bug crawling feeling on me; I am nauseous" post medication administration. Zenia Resides, MD made aware.
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
## 2017-02-16 NOTE — Progress Notes (Signed)
Paged Dr Olevia Bowens and made him aware that the pt;s ciwa is 39, she is restless and having body tremors. Orders given for PRN Ativan. I will continue to monitor.

## 2017-02-16 NOTE — ED Notes (Addendum)
At bedside with Zenia Resides, MD. Plan to store belongings outside of room. Belongings placed in one pt belongings bag in appropriate cabinet outside of room. Valuables inside bag include acer laptop with charger and three prescription bottles (clonazepam 0.5 mg tab EMPTY, chlordiazepoxide 25 mg tab EMPTY, and lorazepam 0.5 mg tab 1/4 tab left). Pt has phone with charger and stuffed animal at bedside. Pt denies SI/HI. Pt belongings stored outside of room for medication safety related to pt poor historian regarding medication use.

## 2017-02-16 NOTE — Progress Notes (Signed)
Called to get report on the pt coming to room 1514, Ed RN is unavailable at this time. I will continue to monitor.

## 2017-02-16 NOTE — H&P (Signed)
History and Physical    Kristine Macias:[id number] DOB: [birth year] DOA: 02/16/2017  PCP: Leonides Sake, MD   Patient coming from: Home.  I have personally briefly reviewed patient's old medical records in [place]  Chief Complaint: Alcohol withdrawal  HPI: Kristine Macias is a 31 y.o. female with medical history significant of alcohol abuse, alcoholic as proctitis, breast cancer, history of colitis, right ear deafness, depression with anxiety, history of malnutrition, pancreas divisum, history of a spontaneous tension pneumothorax, seizure disorder, recently admitted and discharged earlier this month for alcohol withdrawal, who comes to the emergency department with complaints of nausea, emesis, headache, generalized pain, blurred vision and alcohol withdrawal seizure today in the morning. She mentions that she ran out of her lorazepam and started drinking again for the past few days to avoid withdrawal symptoms and seizures. She denies fever, but feels chills and fatigue. She denies dyspnea, chest pain, dizziness, diaphoresis, pitting edema of the lower extremities, but complains of palpitations. She says that since yesterday she has been having nausea and multiple episodes of emesis associated with mild epigastric pain and several episodes of diarrhea as well. She denies melena or hematochezia.   ED Course: The patient was very tremulous when seen. Her initial vital signs were afebrile, pulse 107, respirations 20, blood pressure 134/110 mmHg and O2 sat of 97% on room air. She received a normal saline bolus, IV Zofran and multiple rounds of IV lorazepam with partial report of relief.  Lab work shows WBC is 6.4, hemoglobin 15.6 g/dL and platelets 342. Sodium 134, potassium 4.1, chloride 96, bicarbonate 22 mmol/L. BUN was 12, creatinine 0.67, ethanol 208, glucose 93, magnesium 1.8 and phosphorus 3.2 mg/dL. She had mild transaminitis, but otherwise her LFTs were  unremarkable.  Review of Systems: As per HPI otherwise 10 point review of systems negative.    Past Medical History:  Diagnosis Date  . Alcohol abuse 11/2014   drinking since age 31. chronic, recurrent. at least 2 detox admits before 2015.   Marland Kitchen Alcoholic hepatitis 09/6376   hepatic steatosis on 11/2014 ultrasound.   . Breast cancer [facility])    age 17, lump removed from left breast  . Chlamydia 2007   bacterial vaginosis 09/2011  . Colitis 12/2014   colonoscopy for diarrhea and abnml CT 01/06/15: erythmatous TI (path: active ileitis, ? emerging IBD), rectal erythema (path: mucosal prolapse). Random bx of normal colon (path unremarkable)   . Congenital deafness    left ear only.  Also noted in mother and sibling (unilateral).   . Depression with anxiety initally at age 43  . Failure to thrive in adult 12/2014.    Malnutrition: n/v, not eating, weight loss, BMI 14.  s/p 01/11/2015 PEG (Dr Dorna Leitz).   . Hypertensive urgency   . Irregular heart beat 2010   wt/diet related after evaluation  . Pancreas divisum 02/2015   Type 1 seen on CT.   Marland Kitchen Pneumothorax, spontaneous, tension   . Renal disorder   . Seizure ([place]) 06/2015   due to ETOH/benzo withdrawal.   . Spontaneous pneumothorax 08/2012   right.  chest tube placed.   . Thrombocytopenia ([place]) 04/2007    Past Surgical History:  Procedure Laterality Date  . CESAREAN SECTION  07/2009    x 1.  G3, Para 2012.   . CHEST TUBE INSERTION    . COLONOSCOPY WITH PROPOFOL N/A 01/06/2015   [facility], Dr Rayann Heman. colonoscopy for diarrhea and abnml CT 01/06/15: erythmatous TI (path: active  ileitis, ? emerging IBD), rectal erythema (path: mucosal prolapse). Random bx of normal colon (path unremarkable)   . ESOPHAGOGASTRODUODENOSCOPY N/A 01/06/2015   ;[facility], Dr Rayann Heman. For wt loss, N/V: Normal study, duodenal biopsy/pathology: chronic active duodenitis.   Marland Kitchen ESOPHAGOGASTRODUODENOSCOPY N/A 01/11/2015   Rein-normal with PEG placement  . PEG PLACEMENT N/A 01/11/2015   [facility], Dr  Rayann Heman.  for N/V/wt loss/severe malnutrition.      reports that she has been smoking Cigarettes.  She has a 13.00 pack-year smoking history. She has never used smokeless tobacco. She reports that she drinks about 21.6 oz of alcohol per week . She reports that she does not use drugs.  Allergies  Allergen Reactions  . Aspirin Shortness Of Breath  . Effexor [Venlafaxine] Anaphylaxis  . Gabapentin Anaphylaxis  . Libritabs [Chlordiazepoxide] Anaphylaxis    Tolerates lorazepam and clonazepam  . Sulfa Antibiotics Anaphylaxis  . Tetracyclines & Related Anaphylaxis  . Trazodone And Nefazodone Anaphylaxis  . Latex Hives and Itching  . Paxil [Paroxetine] Hives and Itching  . Seroquel [Quetiapine Fumarate] Hives and Itching  . Zoloft [Sertraline Hcl] Hives and Itching    Family History  Problem Relation Age of Onset  . Thyroid disease Mother   . Cancer Mother        breast cancer  . Cancer Father        lung cancer  . Stroke Other   . Crohn's disease Brother   . Cancer Maternal Grandmother        breast cancer  . Anesthesia problems Neg Hx     Prior to Admission medications   Medication Sig Start Date End Date Taking? Authorizing Provider  clonazePAM (KLONOPIN) 0.5 MG tablet Take 0.25 mg by mouth 2 (two) times daily as needed for anxiety.  02/08/17  Yes [provider]  LORazepam (ATIVAN) 1 MG tablet Take 1 tablet (1 mg total) by mouth every 8 (eight) hours as needed for anxiety. 02/03/17 02/03/18 Yes Hosie Poisson, MD  Melatonin 1 MG CAPS Take 1 mg by mouth at bedtime as needed. sleep   Yes [provider]  chlordiazePOXIDE (LIBRIUM) 25 MG capsule 41m PO TID x 1D, then 25-537mPO BID X 1D, then 25-5081mO QD X 1D Patient not taking: Reported on 02/16/2017 01/28/17   JoyLorayne BenderA-C  Multiple Vitamin (MULTIVITAMIN WITH MINERALS) TABS tablet Take 1 tablet by mouth daily. Patient not taking: Reported on 02/16/2017 01/21/17   JohMurlean IbaD  thiamine 100 MG tablet  Take 1 tablet (100 mg total) by mouth daily. Patient not taking: Reported on 02/16/2017 12/14/16   KriBonnielee HaffD    Physical Exam: Vitals:   02/16/17 1746 02/16/17 1900 02/16/17 2125 02/16/17 2155  BP: (!) 128/102 117/78 (!) 139/104 (!) 128/98  Pulse: (!) 121 (!) 114 (!) 106 (!) 121  Resp: 18 15 18 18   Temp:  98.2 F (36.8 C) 97.8 F (36.6 C) 98.5 F (36.9 C)  TempSrc:  Oral Oral Oral  SpO2: 95% 98% 96% 96%  Weight:      Height:        Constitutional: Anxious and tremulous. Eyes: PERRL, lids and conjunctivae normal ENMT: Mucous membranes are moist. Posterior pharynx clear of any exudate or lesions. Neck: normal, supple, no masses, no thyromegaly Respiratory: clear to auscultation bilaterally, no wheezing, no crackles. Normal respiratory effort. No accessory muscle use.  Cardiovascular: Tachycardic at 116 BPM, no murmurs / rubs / gallops. No extremity edema. 2+ pedal pulses. No carotid bruits.  Abdomen: Soft, mild diffuse tenderness, no guarding/rebound/masses palpated. No hepatosplenomegaly. Bowel sounds positive.  Musculoskeletal: no clubbing / cyanosis.  Good ROM, no contractures. Normal muscle tone.  Skin: no rashes, lesions, ulcers on limited skin exam. Neurologic: Severe tremors. CN 2-12 grossly intact. Sensation intact, DTR normal. Strength 5/5 in all 4.  Psychiatric: Alert and oriented 4. Severe anxiety.    Labs on Admission: I have personally reviewed following labs and imaging studies  CBC:  Recent Labs Lab 02/16/17 1611 02/16/17 2208  WBC 6.4  --   HGB 15.6* 15.1*  HCT 45.1 43.7  MCV 84.8  --   PLT 342  --    Basic Metabolic Panel:  Recent Labs Lab 02/16/17 1611 02/16/17 1900  NA 134*  --   K 4.1  --   CL 96*  --   CO2 22  --   GLUCOSE 93  --   BUN 12  --   CREATININE 0.67  --   CALCIUM 9.2  --   MG  --  1.8  PHOS  --  3.2   GFR: Estimated Creatinine Clearance: 95.4 mL/min (by C-G formula based on SCr of 0.67 mg/dL). Liver Function  Tests:  Recent Labs Lab 02/16/17 1611  AST 119*  ALT 77*  ALKPHOS 88  BILITOT 0.8  PROT 8.2*  ALBUMIN 4.4   No results for input(s): LIPASE, AMYLASE in the last 168 hours. No results for input(s): AMMONIA in the last 168 hours. Coagulation Profile: No results for input(s): INR, PROTIME in the last 168 hours. Cardiac Enzymes: No results for input(s): CKTOTAL, CKMB, CKMBINDEX, TROPONINI in the last 168 hours. BNP (last 3 results) No results for input(s): PROBNP in the last 8760 hours. HbA1C: No results for input(s): HGBA1C in the last 72 hours. CBG: No results for input(s): GLUCAP in the last 168 hours. Lipid Profile: No results for input(s): CHOL, HDL, LDLCALC, TRIG, CHOLHDL, LDLDIRECT in the last 72 hours. Thyroid Function Tests: No results for input(s): TSH, T4TOTAL, FREET4, T3FREE, THYROIDAB in the last 72 hours. Anemia Panel: No results for input(s): VITAMINB12, FOLATE, FERRITIN, TIBC, IRON, RETICCTPCT in the last 72 hours. Urine analysis:    Component Value Date/Time   COLORURINE YELLOW 01/28/2017 1125   APPEARANCEUR CLEAR 01/28/2017 1125   APPEARANCEUR Clear 02/15/2016 1456   LABSPEC 1.004 (L) 01/28/2017 1125   LABSPEC 1.006 12/20/2014 1018   PHURINE 7.0 01/28/2017 1125   GLUCOSEU NEGATIVE 01/28/2017 1125   GLUCOSEU Negative 12/20/2014 1018   HGBUR NEGATIVE 01/28/2017 1125   BILIRUBINUR NEGATIVE 01/28/2017 1125   BILIRUBINUR neg 03/13/2016 1700   BILIRUBINUR Negative 02/15/2016 1456   BILIRUBINUR Negative 12/20/2014 1018   KETONESUR NEGATIVE 01/28/2017 1125   PROTEINUR NEGATIVE 01/28/2017 1125   UROBILINOGEN negative 03/13/2016 1700   UROBILINOGEN 0.2 06/13/2012 2306   NITRITE NEGATIVE 01/28/2017 1125   LEUKOCYTESUR TRACE (A) 01/28/2017 1125   LEUKOCYTESUR Negative 02/15/2016 1456   LEUKOCYTESUR Negative 12/20/2014 1018   11/13/2016 EEG adult.  Impression: This awake and drowsy EEG is normal except for excess amount of diffuse low voltage beta  activity.  Clinical Correlation: Diffuse low voltage beta activity is commonly seen with sedating medications such as benzodiazepines.  In the absence of sedating medications, anxiety and hyperthyroidism may produce generalized beta activity.  The absence of epileptiform discharges does not exclude a clinical diagnosis of epilepsy. Clinical correlation is advised. Radiological Exams on Admission: No results found.  EKG: Independently reviewed.  Assessment/Plan Principal Problem:   Alcohol withdrawal ([place]) Admit  to telemetry unit/inpatient. Continue IV lorazepam per CiWA protocol. Supplement magnesium. Supplement thiamine. Supplement folic acid and multivitamin.  Active Problems:   Generalized anxiety disorder  The patient medicates herself with alcohol when benzodiazepines not available. Treatment is complicated by the fact that the patient has allergy reactions to multiple antidepressants and psychotropic medications. Advised to follow-up with behavioral health for possible treatment options.    Alcohol abuse As above.    Tobacco use Declined nicotine replacement therapy.     DVT prophylaxis: SCDs. Code Status: Full code. Family Communication:  Disposition Plan: Admit for alcohol withdrawal protocol. Consults called:  Admission status: Inpatient/telemetry.   Reubin Milan MD [facility] Pager [phone].  If 7PM-7AM, please contact night-coverage [email or web address] Password [facility]  02/16/2017, 10:56 PM

## 2017-02-16 NOTE — ED Triage Notes (Signed)
Pt complaint of nausea, vomiting, headache, generalized pain, blurred vision, and seizure related to alcohol withdrawal; last drink 1100 today. Pt alert and oriented.  EMS verbalize witnessed seizure activity with associated tremor but eye movement to verbal; no post ictal state.

## 2017-02-17 LAB — COMPREHENSIVE METABOLIC PANEL
ALT: 55 U/L — ABNORMAL HIGH (ref 14–54)
ANION GAP: 8 (ref 5–15)
AST: 79 U/L — ABNORMAL HIGH (ref 15–41)
Albumin: 3.4 g/dL — ABNORMAL LOW (ref 3.5–5.0)
Alkaline Phosphatase: 69 U/L (ref 38–126)
BUN: 12 mg/dL (ref 6–20)
CHLORIDE: 102 mmol/L (ref 101–111)
CO2: 26 mmol/L (ref 22–32)
Calcium: 7.7 mg/dL — ABNORMAL LOW (ref 8.9–10.3)
Creatinine, Ser: 0.48 mg/dL (ref 0.44–1.00)
Glucose, Bld: 107 mg/dL — ABNORMAL HIGH (ref 65–99)
Potassium: 3.3 mmol/L — ABNORMAL LOW (ref 3.5–5.1)
SODIUM: 136 mmol/L (ref 135–145)
Total Bilirubin: 1.3 mg/dL — ABNORMAL HIGH (ref 0.3–1.2)
Total Protein: 6.5 g/dL (ref 6.5–8.1)

## 2017-02-17 LAB — CBC
HCT: 37.5 % (ref 36.0–46.0)
Hemoglobin: 12.4 g/dL (ref 12.0–15.0)
MCH: 28.7 pg (ref 26.0–34.0)
MCHC: 33.1 g/dL (ref 30.0–36.0)
MCV: 86.8 fL (ref 78.0–100.0)
PLATELETS: 250 10*3/uL (ref 150–400)
RBC: 4.32 MIL/uL (ref 3.87–5.11)
RDW: 15.5 % (ref 11.5–15.5)
WBC: 6.8 10*3/uL (ref 4.0–10.5)

## 2017-02-17 LAB — HIV ANTIBODY (ROUTINE TESTING W REFLEX): HIV Screen 4th Generation wRfx: NONREACTIVE

## 2017-02-17 LAB — MRSA PCR SCREENING: MRSA BY PCR: NEGATIVE

## 2017-02-17 MED ORDER — POTASSIUM CHLORIDE CRYS ER 20 MEQ PO TBCR
40.0000 meq | EXTENDED_RELEASE_TABLET | Freq: Once | ORAL | Status: AC
Start: 2017-02-17 — End: 2017-02-17
  Administered 2017-02-17: 40 meq via ORAL
  Filled 2017-02-17: qty 2

## 2017-02-17 MED ORDER — ONDANSETRON HCL 4 MG/2ML IJ SOLN
4.0000 mg | Freq: Four times a day (QID) | INTRAMUSCULAR | Status: DC | PRN
  Administered 2017-02-17 – 2017-02-21 (×6): 4 mg via INTRAVENOUS
  Filled 2017-02-17 (×6): qty 2

## 2017-02-17 MED ORDER — LORAZEPAM 2 MG/ML IJ SOLN
2.0000 mg | INTRAMUSCULAR | Status: DC | PRN
  Administered 2017-02-17 – 2017-02-18 (×11): 2 mg via INTRAVENOUS
  Administered 2017-02-18: 3 mg via INTRAVENOUS
  Administered 2017-02-18 – 2017-02-19 (×9): 2 mg via INTRAVENOUS
  Administered 2017-02-19: 3 mg via INTRAVENOUS
  Administered 2017-02-19 – 2017-02-21 (×17): 2 mg via INTRAVENOUS
  Filled 2017-02-17 (×16): qty 1
  Filled 2017-02-17: qty 2
  Filled 2017-02-17 (×3): qty 1
  Filled 2017-02-17: qty 2
  Filled 2017-02-17 (×18): qty 1

## 2017-02-17 NOTE — Progress Notes (Signed)
PROGRESS NOTE    Kristine Macias  HGD:[id number] DOB: [birth year] DOA: 02/16/2017 PCP: Leonides Sake, MD     Brief Narrative:  Kristine Macias is a 31 y.o. female with medical history significant of alcohol abuse, breast cancer, history of colitis, right ear deafness, depression with anxiety, history of malnutrition, pancreas divisum, history of a spontaneous tension pneumothorax, seizure disorder, recently admitted and discharged earlier this month for alcohol withdrawal, who comes to the emergency department with complaints of nausea, emesis, headache, generalized pain, blurred vision and alcohol withdrawal seizure today in the morning. She mentions that she ran out of her lorazepam and started drinking again for the past few days to avoid withdrawal symptoms and seizures.  Assessment & Plan:   Principal Problem:   Alcohol withdrawal ([place]) Active Problems:   Generalized anxiety disorder   Alcohol abuse   Tobacco use   Alcohol withdrawal -Patient has been admitted for alcohol withdrawal many times in the past 6 months, this is her 10th admission since January 2018, had 8 admissions in 2017  -Transfer to stepdown unit for stepdown CIWA protocol -Continue IV fluids, thiamine, folic acid, multivitamin  Generalized anxiety disorder -Patient needs to follow up with outpatient behavioral health for better treatment  Hypokalemia -Replace  Tobacco abuse -Cessation counseling   DVT prophylaxis: SCD Code Status: Full Family Communication:  No family at bedside Disposition Plan: pending improvement, home    Consultants:   None  Procedures:   None  Antimicrobials:  Anti-infectives    None        Subjective: Patient reports that her primary care physician has stopped her Klonopin cold [place]. She has been drinking alcohol to self medicate. She only admits to 3 beers daily. Her having withdrawal symptoms and presented to the hospital. She admits to visual  hallucinations, tremors. Has no appetite, but no vomiting or diarrhea, no abdominal pain. No shortness of breath or chest pain.  Objective: Vitals:   02/16/17 1900 02/16/17 2125 02/16/17 2155 02/17/17 0550  BP: 117/78 (!) 139/104 (!) 128/98 118/86  Pulse: (!) 114 (!) 106 (!) 121 79  Resp: 15 18 18 17   Temp: 98.2 F (36.8 C) 97.8 F (36.6 C) 98.5 F (36.9 C) 97.9 F (36.6 C)  TempSrc: Oral Oral Oral Oral  SpO2: 98% 96% 96% 96%  Weight:      Height:        Intake/Output Summary (Last 24 hours) at 02/17/17 1212 Last data filed at 02/17/17 0800  Gross per 24 hour  Intake             1500 ml  Output                0 ml  Net             1500 ml   Filed Weights   02/16/17 1553  Weight: 63.5 kg (140 lb)    Examination:  General exam: Appears diaphoretic, tremulous  Respiratory system: Clear to auscultation. Respiratory effort normal. Cardiovascular system: S1 & S2 heard, RRR. No JVD, murmurs, rubs, gallops or clicks. No pedal edema. Gastrointestinal system: Abdomen is nondistended, soft and nontender. No organomegaly or masses felt. Normal bowel sounds heard. Central nervous system: Alert and oriented. No focal neurological deficits. Extremities: Symmetric 5 x 5 power. Skin: No rashes, lesions or ulcers Psychiatry: Judgement and insight appear normal. Mood & affect appropriate.   Data Reviewed: I have personally reviewed following labs and imaging studies  CBC:  Recent Labs  Lab 02/16/17 1611 02/16/17 2208 02/17/17 0735  WBC 6.4  --  6.8  HGB 15.6* 15.1* 12.4  HCT 45.1 43.7 37.5  MCV 84.8  --  86.8  PLT 342  --  569   Basic Metabolic Panel:  Recent Labs Lab 02/16/17 1611 02/16/17 1900 02/17/17 0735  NA 134*  --  136  K 4.1  --  3.3*  CL 96*  --  102  CO2 22  --  26  GLUCOSE 93  --  107*  BUN 12  --  12  CREATININE 0.67  --  0.48  CALCIUM 9.2  --  7.7*  MG  --  1.8  --   PHOS  --  3.2  --    GFR: Estimated Creatinine Clearance: 95.4 mL/min (by C-G  formula based on SCr of 0.48 mg/dL). Liver Function Tests:  Recent Labs Lab 02/16/17 1611 02/17/17 0735  AST 119* 79*  ALT 77* 55*  ALKPHOS 88 69  BILITOT 0.8 1.3*  PROT 8.2* 6.5  ALBUMIN 4.4 3.4*   No results for input(s): LIPASE, AMYLASE in the last 168 hours. No results for input(s): AMMONIA in the last 168 hours. Coagulation Profile: No results for input(s): INR, PROTIME in the last 168 hours. Cardiac Enzymes: No results for input(s): CKTOTAL, CKMB, CKMBINDEX, TROPONINI in the last 168 hours. BNP (last 3 results) No results for input(s): PROBNP in the last 8760 hours. HbA1C: No results for input(s): HGBA1C in the last 72 hours. CBG: No results for input(s): GLUCAP in the last 168 hours. Lipid Profile: No results for input(s): CHOL, HDL, LDLCALC, TRIG, CHOLHDL, LDLDIRECT in the last 72 hours. Thyroid Function Tests: No results for input(s): TSH, T4TOTAL, FREET4, T3FREE, THYROIDAB in the last 72 hours. Anemia Panel: No results for input(s): VITAMINB12, FOLATE, FERRITIN, TIBC, IRON, RETICCTPCT in the last 72 hours. Sepsis Labs: No results for input(s): PROCALCITON, LATICACIDVEN in the last 168 hours.  No results found for this or any previous visit (from the past 240 hour(s)).     Radiology Studies: No results found.    Scheduled Meds: . folic acid  1 mg Oral Daily  . multivitamin with minerals  1 tablet Oral Daily  . pantoprazole (PROTONIX) IV  40 mg Intravenous Q12H  . thiamine  100 mg Oral Daily   Or  . thiamine  100 mg Intravenous Daily   Continuous Infusions: . dextrose 5 % and 0.9% NaCl 125 mL/hr at 02/17/17 0026     LOS: 1 day    Time spent: 40 minutes   Dessa Phi, DO [facility] [email or web address] Password [email or web address] 02/17/2017, 12:12 PM

## 2017-02-17 NOTE — Progress Notes (Signed)
Writer gave report to receiving nurse, Lattie Haw, in stepdown room 1222. Pt will be transferred in the next few minutes.

## 2017-02-17 NOTE — Progress Notes (Signed)
Received report from Erin Sons, RN from [place].  Notified pt will arrive in a few minutes.

## 2017-02-17 NOTE — Progress Notes (Signed)
Pt arrival on unit.  Obtained ht & wt notified E-link and Central telemetry. Vital signs stable, pt comfortable, bed in lowest position and call bell within reach.

## 2017-02-18 LAB — BASIC METABOLIC PANEL
Anion gap: 5 (ref 5–15)
CALCIUM: 8.4 mg/dL — AB (ref 8.9–10.3)
CO2: 24 mmol/L (ref 22–32)
CREATININE: 0.5 mg/dL (ref 0.44–1.00)
Chloride: 108 mmol/L (ref 101–111)
GFR calc Af Amer: >60 mL/min (ref >60)
GFR calc non Af Amer: >60 mL/min (ref >60)
Glucose, Bld: 115 mg/dL — ABNORMAL HIGH (ref 65–99)
Potassium: 3.8 mmol/L (ref 3.5–5.1)
SODIUM: 137 mmol/L (ref 135–145)

## 2017-02-18 LAB — MAGNESIUM: Magnesium: 1.9 mg/dL (ref 1.7–2.4)

## 2017-02-18 MED ORDER — ENOXAPARIN SODIUM 40 MG/0.4ML ~~LOC~~ SOLN
40.0000 mg | SUBCUTANEOUS | Status: DC
  Administered 2017-02-18 – 2017-02-21 (×4): 40 mg via SUBCUTANEOUS
  Filled 2017-02-18 (×4): qty 0.4

## 2017-02-18 MED ORDER — PANTOPRAZOLE SODIUM 40 MG PO TBEC
40.0000 mg | DELAYED_RELEASE_TABLET | Freq: Two times a day (BID) | ORAL | Status: DC
  Administered 2017-02-18 – 2017-02-22 (×8): 40 mg via ORAL
  Filled 2017-02-18 (×8): qty 1

## 2017-02-18 MED ORDER — SODIUM CHLORIDE 0.9 % IV SOLN
INTRAVENOUS | Status: DC
  Administered 2017-02-18 – 2017-02-19 (×2): via INTRAVENOUS

## 2017-02-18 NOTE — Progress Notes (Signed)
Pharmacy: lovenox  Patient is a 31 y.o F currently being treated for EtOH withdrawal.  To start lovenox for VTE prophylaxis.  - scr stable (crcl~95) - hgb down 12.4, plt ok - no bleeding documented - on NS at 75 ml/hr  Plan: - lovenox 40 mg SQ daily - pharmacy will sign off.  Reconsult [place] if need further assistance  Dia Sitter, PharmD, BCPS 02/18/2017 11:53 AM'
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
## 2017-02-18 NOTE — Progress Notes (Signed)
PROGRESS NOTE    Kristine Macias  NWG:[id number] DOB: 08/04/1986 DOA: 02/16/2017 PCP: Leonides Sake, MD     Brief Narrative:  Kristine Macias is a 31 y.o. female with medical history significant of alcohol abuse, breast cancer, history of colitis, right ear deafness, depression with anxiety, history of malnutrition, pancreas divisum, history of a spontaneous tension pneumothorax, seizure disorder, recently admitted and discharged earlier this month for alcohol withdrawal, who comes to the emergency department with complaints of nausea, emesis, headache, generalized pain, blurred vision and alcohol withdrawal seizure today in the morning. She mentions that she ran out of her lorazepam and started drinking again for the past few days to avoid withdrawal symptoms and seizures.  Assessment & Plan:   Principal Problem:   Alcohol withdrawal ([place]) Active Problems:   Generalized anxiety disorder   Alcohol abuse   Tobacco use   Alcohol withdrawal -Patient has been admitted for alcohol withdrawal many times in the past 6 months, this is her 10th admission since January 2018, had 8 admissions in 2017  -Remain in stepdown unit for stepdown CIWA protocol -Continue IV fluids, thiamine, folic acid, multivitamin  Generalized anxiety disorder -Patient needs to follow up with outpatient behavioral health for better treatment  Tobacco abuse -Cessation counseling   DVT prophylaxis: SCD Code Status: Full Family Communication:  Sleeping family at bedside Disposition Plan: pending improvement, home    Consultants:   None  Procedures:   None  Antimicrobials:  Anti-infectives    None       Subjective: No new complaints, had nausea and dry heaving overnight, continues to be very anxious    Objective: Vitals:   02/18/17 0200 02/18/17 0400 02/18/17 0600 02/18/17 0750  BP: 108/66 112/82 94/69   Pulse: 72 90 70   Resp: 15 16 13    Temp:  98.4 F (36.9 C)  97.7 F (36.5  C)  TempSrc:  Oral  Oral  SpO2: 97% 96%    Weight:      Height:        Intake/Output Summary (Last 24 hours) at 02/18/17 0834 Last data filed at 02/18/17 5784  Gross per 24 hour  Intake          3137.92 ml  Output              400 ml  Net          2737.92 ml   Filed Weights   02/16/17 1553  Weight: 63.5 kg (140 lb)    Examination:  General exam: Appears more comfortable today   Respiratory system: Clear to auscultation. Respiratory effort normal. Cardiovascular system: S1 & S2 heard, RRR. No JVD, murmurs, rubs, gallops or clicks. No pedal edema. Gastrointestinal system: Abdomen is nondistended, soft and nontender. No organomegaly or masses felt. Normal bowel sounds heard. Central nervous system: Alert and oriented. No focal neurological deficits. Extremities: Symmetric 5 x 5 power. Skin: No rashes, lesions or ulcers Psychiatry: Judgement and insight appear normal. Mood & affect appropriate.   Data Reviewed: I have personally reviewed following labs and imaging studies  CBC:  Recent Labs Lab 02/16/17 1611 02/16/17 2208 02/17/17 0735  WBC 6.4  --  6.8  HGB 15.6* 15.1* 12.4  HCT 45.1 43.7 37.5  MCV 84.8  --  86.8  PLT 342  --  696   Basic Metabolic Panel:  Recent Labs Lab 02/16/17 1611 02/16/17 1900 02/17/17 0735 02/18/17 0352  NA 134*  --  136 137  K 4.1  --  3.3* 3.8  CL 96*  --  102 108  CO2 22  --  26 24  GLUCOSE 93  --  107* 115*  BUN 12  --  12 <5*  CREATININE 0.67  --  0.48 0.50  CALCIUM 9.2  --  7.7* 8.4*  MG  --  1.8  --  1.9  PHOS  --  3.2  --   --    GFR: Estimated Creatinine Clearance: 95.4 mL/min (by C-G formula based on SCr of 0.5 mg/dL). Liver Function Tests:  Recent Labs Lab 02/16/17 1611 02/17/17 0735  AST 119* 79*  ALT 77* 55*  ALKPHOS 88 69  BILITOT 0.8 1.3*  PROT 8.2* 6.5  ALBUMIN 4.4 3.4*   No results for input(s): LIPASE, AMYLASE in the last 168 hours. No results for input(s): AMMONIA in the last 168  hours. Coagulation Profile: No results for input(s): INR, PROTIME in the last 168 hours. Cardiac Enzymes: No results for input(s): CKTOTAL, CKMB, CKMBINDEX, TROPONINI in the last 168 hours. BNP (last 3 results) No results for input(s): PROBNP in the last 8760 hours. HbA1C: No results for input(s): HGBA1C in the last 72 hours. CBG: No results for input(s): GLUCAP in the last 168 hours. Lipid Profile: No results for input(s): CHOL, HDL, LDLCALC, TRIG, CHOLHDL, LDLDIRECT in the last 72 hours. Thyroid Function Tests: No results for input(s): TSH, T4TOTAL, FREET4, T3FREE, THYROIDAB in the last 72 hours. Anemia Panel: No results for input(s): VITAMINB12, FOLATE, FERRITIN, TIBC, IRON, RETICCTPCT in the last 72 hours. Sepsis Labs: No results for input(s): PROCALCITON, LATICACIDVEN in the last 168 hours.  Recent Results (from the past 240 hour(s))  MRSA PCR Screening     Status: None   Collection Time: 02/17/17 12:44 PM  Result Value Ref Range Status   MRSA by PCR NEGATIVE NEGATIVE Final    Comment:        The GeneXpert MRSA Assay (FDA approved for NASAL specimens only), is one component of a comprehensive MRSA colonization surveillance program. It is not intended to diagnose MRSA infection nor to guide or monitor treatment for MRSA infections.        Radiology Studies: No results found.    Scheduled Meds: . folic acid  1 mg Oral Daily  . multivitamin with minerals  1 tablet Oral Daily  . pantoprazole (PROTONIX) IV  40 mg Intravenous Q12H  . thiamine  100 mg Oral Daily   Or  . thiamine  100 mg Intravenous Daily   Continuous Infusions: . dextrose 5 % and 0.9% NaCl 125 mL/hr at 02/18/17 0611     LOS: 2 days    Time spent: 30 minutes   Dessa Phi, DO [facility] [email or web address] Password [facility] 02/18/2017, 8:34 AM

## 2017-02-19 NOTE — Progress Notes (Signed)
PROGRESS NOTE    Kristine Macias  QHU:[id number] DOB: 14-Apr-1986 DOA: 02/16/2017 PCP: Leonides Sake, MD     Brief Narrative:  Kristine Macias is a 31 y.o. female with medical history significant of alcohol abuse, breast cancer, history of colitis, right ear deafness, depression with anxiety, history of malnutrition, pancreas divisum, history of a spontaneous tension pneumothorax, seizure disorder, recently admitted and discharged earlier this month for alcohol withdrawal, who comes to the emergency department with complaints of nausea, emesis, headache, generalized pain, blurred vision and alcohol withdrawal seizure today in the morning. She mentions that she ran out of her lorazepam and started drinking again for the past few days to avoid withdrawal symptoms and seizures.  Assessment & Plan:   Principal Problem:   Alcohol withdrawal ([place]) Active Problems:   Generalized anxiety disorder   Alcohol abuse   Tobacco use   Alcohol withdrawal -Patient has been admitted for alcohol withdrawal many times in the past 6 months, this is her 10th admission since January 2018, had 8 admissions in 2017  -Remain in stepdown unit for stepdown CIWA protocol -Continue IV fluids, thiamine, folic acid, multivitamin  Generalized anxiety disorder -Patient needs to follow up with outpatient behavioral health for better treatment  Tobacco abuse -Cessation counseling   DVT prophylaxis: SCD Code Status: Full Family Communication:  Sleeping family at bedside Disposition Plan: pending improvement, home    Consultants:   None  Procedures:   None  Antimicrobials:  Anti-infectives    None       Subjective: No new complaints. Continues to be intermittently nauseous.    Objective: Vitals:   02/18/17 2305 02/19/17 0000 02/19/17 0200 02/19/17 0800  BP:  (!) 156/109 (!) 116/97 97/64  Pulse:  97 77 60  Resp:  15 (!) 23   Temp: 98 F (36.7 C) 98.4 F (36.9 C)  98 F (36.7 C)    TempSrc: Oral Oral  Oral  SpO2:  97%  97%  Weight:      Height:        Intake/Output Summary (Last 24 hours) at 02/19/17 1111 Last data filed at 02/19/17 0800  Gross per 24 hour  Intake             1575 ml  Output              900 ml  Net              675 ml   Filed Weights   02/16/17 1553  Weight: 63.5 kg (140 lb)    Examination:  General exam: Appears more comfortable today, tremors  Respiratory system: Clear to auscultation. Respiratory effort normal. Cardiovascular system: S1 & S2 heard, RRR. No JVD, murmurs, rubs, gallops or clicks. No pedal edema. Gastrointestinal system: Abdomen is nondistended, soft and nontender. No organomegaly or masses felt. Normal bowel sounds heard. Central nervous system: Alert and oriented. No focal neurological deficits. Extremities: Symmetric 5 x 5 power. Skin: No rashes, lesions or ulcers Psychiatry: Judgement and insight appear normal. Mood & affect appropriate.   Data Reviewed: I have personally reviewed following labs and imaging studies  CBC:  Recent Labs Lab 02/16/17 1611 02/16/17 2208 02/17/17 0735  WBC 6.4  --  6.8  HGB 15.6* 15.1* 12.4  HCT 45.1 43.7 37.5  MCV 84.8  --  86.8  PLT 342  --  465   Basic Metabolic Panel:  Recent Labs Lab 02/16/17 1611 02/16/17 1900 02/17/17 0735 02/18/17 0352  NA 134*  --  136 137  K 4.1  --  3.3* 3.8  CL 96*  --  102 108  CO2 22  --  26 24  GLUCOSE 93  --  107* 115*  BUN 12  --  12 <5*  CREATININE 0.67  --  0.48 0.50  CALCIUM 9.2  --  7.7* 8.4*  MG  --  1.8  --  1.9  PHOS  --  3.2  --   --    GFR: Estimated Creatinine Clearance: 95.4 mL/min (by C-G formula based on SCr of 0.5 mg/dL). Liver Function Tests:  Recent Labs Lab 02/16/17 1611 02/17/17 0735  AST 119* 79*  ALT 77* 55*  ALKPHOS 88 69  BILITOT 0.8 1.3*  PROT 8.2* 6.5  ALBUMIN 4.4 3.4*   No results for input(s): LIPASE, AMYLASE in the last 168 hours. No results for input(s): AMMONIA in the last 168  hours. Coagulation Profile: No results for input(s): INR, PROTIME in the last 168 hours. Cardiac Enzymes: No results for input(s): CKTOTAL, CKMB, CKMBINDEX, TROPONINI in the last 168 hours. BNP (last 3 results) No results for input(s): PROBNP in the last 8760 hours. HbA1C: No results for input(s): HGBA1C in the last 72 hours. CBG: No results for input(s): GLUCAP in the last 168 hours. Lipid Profile: No results for input(s): CHOL, HDL, LDLCALC, TRIG, CHOLHDL, LDLDIRECT in the last 72 hours. Thyroid Function Tests: No results for input(s): TSH, T4TOTAL, FREET4, T3FREE, THYROIDAB in the last 72 hours. Anemia Panel: No results for input(s): VITAMINB12, FOLATE, FERRITIN, TIBC, IRON, RETICCTPCT in the last 72 hours. Sepsis Labs: No results for input(s): PROCALCITON, LATICACIDVEN in the last 168 hours.  Recent Results (from the past 240 hour(s))  MRSA PCR Screening     Status: None   Collection Time: 02/17/17 12:44 PM  Result Value Ref Range Status   MRSA by PCR NEGATIVE NEGATIVE Final    Comment:        The GeneXpert MRSA Assay (FDA approved for NASAL specimens only), is one component of a comprehensive MRSA colonization surveillance program. It is not intended to diagnose MRSA infection nor to guide or monitor treatment for MRSA infections.        Radiology Studies: No results found.    Scheduled Meds: . enoxaparin (LOVENOX) injection  40 mg Subcutaneous Q24H  . folic acid  1 mg Oral Daily  . multivitamin with minerals  1 tablet Oral Daily  . pantoprazole  40 mg Oral BID  . thiamine  100 mg Oral Daily   Continuous Infusions: . sodium chloride 75 mL/hr at 02/18/17 1024     LOS: 3 days    Time spent: 30 minutes   Dessa Phi, DO [facility] [email or web address] Password [email or web address] 02/19/2017, 11:11 AM

## 2017-02-19 NOTE — Progress Notes (Signed)
CM consult for homelessness and medication needs. Pt has Medicaid so is not eligible for any additional medication resources. CM cannot assist with homelessness, would need CSW consult.  Marney Doctor RN,BSN,NCM [phone]

## 2017-02-19 NOTE — Progress Notes (Signed)
CSW following to assist with d/c planning. Pt on phone when CSW entered room. Pt continued with conversation.  CSW left SA / MH / Shelter resources for pt to review.  Werner Lean LCSW [phone]

## 2017-02-20 DIAGNOSIS — Z720 Tobacco use: Secondary | ICD-10-CM

## 2017-02-20 DIAGNOSIS — F10230 Alcohol dependence with withdrawal, uncomplicated: Secondary | ICD-10-CM

## 2017-02-20 DIAGNOSIS — F19230 Other psychoactive substance dependence with withdrawal, uncomplicated: Secondary | ICD-10-CM

## 2017-02-20 DIAGNOSIS — F411 Generalized anxiety disorder: Secondary | ICD-10-CM

## 2017-02-20 LAB — BASIC METABOLIC PANEL
Anion gap: 8 (ref 5–15)
BUN: <5 mg/dL — ABNORMAL LOW (ref 6–20)
CHLORIDE: 106 mmol/L (ref 101–111)
CO2: 23 mmol/L (ref 22–32)
Calcium: 9 mg/dL (ref 8.9–10.3)
Creatinine, Ser: 0.45 mg/dL (ref 0.44–1.00)
GFR calc Af Amer: >60 mL/min (ref >60)
GFR calc non Af Amer: >60 mL/min (ref >60)
Glucose, Bld: 92 mg/dL (ref 65–99)
POTASSIUM: 3.5 mmol/L (ref 3.5–5.1)
SODIUM: 137 mmol/L (ref 135–145)

## 2017-02-20 NOTE — Progress Notes (Signed)
PROGRESS NOTE    Kristine Macias  NID:[id number] DOB: May 25, 1986 DOA: 02/16/2017 PCP: Leonides Sake, MD   Brief Narrative: Kristine Macias is a 31 y.o. female with a history of alcohol abuse, breast cancer, colitis, right ear deafness, depression, anxiety, seizure disorder, pancreas divisum. Patient presents with all withdrawal.   Assessment & Plan:   Principal Problem:   Alcohol withdrawal ([place]) Active Problems:   Generalized anxiety disorder   Alcohol abuse   Tobacco use   Alcohol withdrawal Patient with high CIWA scores requiring very frequent Ativan doses. Patient states that she would like to quit and is currently in a program. -Continue CIWA -Continue IV fluids, thiamine, folic acid, multivitamin -Stay in step down secondary to frequent requirements for Ativan  Generalized anxiety disorder Per patient, more of a social disorder. Patient currently does not have a psychiatrist. -Consult case management for psychiatry physician as an outpatient  Tobacco abuse Patient counseled on cessation   DVT prophylaxis: SCDs Code Status: Full code Family Communication: Member sleeping at bedside Disposition Plan: Likely home in several days pending resolution of withdrawal symptoms   Consultants:   None  Procedures:   None  Antimicrobials:  None    Subjective: Patient reports some tremors. No nausea or vomiting.  Objective: Vitals:   02/20/17 0320 02/20/17 0400 02/20/17 0600 02/20/17 0800  BP:  (!) 144/77 111/69 104/64  Pulse:  61 64 (!) 57  Resp:  15 15 13   Temp: 97.9 F (36.6 C)   97.8 F (36.6 C)  TempSrc: Oral   Oral  SpO2:  98% 96% 95%  Weight:      Height:        Intake/Output Summary (Last 24 hours) at 02/20/17 0847 Last data filed at 02/19/17 1200  Gross per 24 hour  Intake              300 ml  Output                0 ml  Net              300 ml   Filed Weights   02/16/17 1553  Weight: 63.5 kg (140 lb)     Examination:  General exam: Appears calm and comfortable Respiratory system: Clear to auscultation. Respiratory effort normal. Cardiovascular system: S1 & S2 heard, RRR. No murmurs. Gastrointestinal system: Abdomen is nondistended, soft and nontender. Normal bowel sounds heard. Central nervous system: Alert and oriented. No focal neurological deficits.Tremor. No asterixis Extremities: No edema. No calf tenderness Skin: No cyanosis. No rashes Psychiatry: Judgement and insight appear normal. Mood & affect appropriate.     Data Reviewed: I have personally reviewed following labs and imaging studies  CBC:  Recent Labs Lab 02/16/17 1611 02/16/17 2208 02/17/17 0735  WBC 6.4  --  6.8  HGB 15.6* 15.1* 12.4  HCT 45.1 43.7 37.5  MCV 84.8  --  86.8  PLT 342  --  144   Basic Metabolic Panel:  Recent Labs Lab 02/16/17 1611 02/16/17 1900 02/17/17 0735 02/18/17 0352 02/20/17 0252  NA 134*  --  136 137 137  K 4.1  --  3.3* 3.8 3.5  CL 96*  --  102 108 106  CO2 22  --  26 24 23   GLUCOSE 93  --  107* 115* 92  BUN 12  --  12 <5* <5*  CREATININE 0.67  --  0.48 0.50 0.45  CALCIUM 9.2  --  7.7* 8.4* 9.0  MG  --  1.8  --  1.9  --   PHOS  --  3.2  --   --   --    GFR: Estimated Creatinine Clearance: 95.4 mL/min (by C-G formula based on SCr of 0.45 mg/dL). Liver Function Tests:  Recent Labs Lab 02/16/17 1611 02/17/17 0735  AST 119* 79*  ALT 77* 55*  ALKPHOS 88 69  BILITOT 0.8 1.3*  PROT 8.2* 6.5  ALBUMIN 4.4 3.4*   No results for input(s): LIPASE, AMYLASE in the last 168 hours. No results for input(s): AMMONIA in the last 168 hours. Coagulation Profile: No results for input(s): INR, PROTIME in the last 168 hours. Cardiac Enzymes: No results for input(s): CKTOTAL, CKMB, CKMBINDEX, TROPONINI in the last 168 hours. BNP (last 3 results) No results for input(s): PROBNP in the last 8760 hours. HbA1C: No results for input(s): HGBA1C in the last 72 hours. CBG: No  results for input(s): GLUCAP in the last 168 hours. Lipid Profile: No results for input(s): CHOL, HDL, LDLCALC, TRIG, CHOLHDL, LDLDIRECT in the last 72 hours. Thyroid Function Tests: No results for input(s): TSH, T4TOTAL, FREET4, T3FREE, THYROIDAB in the last 72 hours. Anemia Panel: No results for input(s): VITAMINB12, FOLATE, FERRITIN, TIBC, IRON, RETICCTPCT in the last 72 hours. Sepsis Labs: No results for input(s): PROCALCITON, LATICACIDVEN in the last 168 hours.  Recent Results (from the past 240 hour(s))  MRSA PCR Screening     Status: None   Collection Time: 02/17/17 12:44 PM  Result Value Ref Range Status   MRSA by PCR NEGATIVE NEGATIVE Final    Comment:        The GeneXpert MRSA Assay (FDA approved for NASAL specimens only), is one component of a comprehensive MRSA colonization surveillance program. It is not intended to diagnose MRSA infection nor to guide or monitor treatment for MRSA infections.          Radiology Studies: No results found.      Scheduled Meds: . enoxaparin (LOVENOX) injection  40 mg Subcutaneous Q24H  . folic acid  1 mg Oral Daily  . multivitamin with minerals  1 tablet Oral Daily  . pantoprazole  40 mg Oral BID  . thiamine  100 mg Oral Daily   Continuous Infusions:   LOS: 4 days     Cordelia Poche, MD [facility] 02/20/2017, 8:47 AM Pager: [phone]  If 7PM-7AM, please contact night-coverage [email or web address] Password [facility] 02/20/2017, 8:47 AM

## 2017-02-20 NOTE — Progress Notes (Signed)
MD needs to contact Psych for consult. CSW is unable to arrange psych consult.  Werner Lean LCSW [phone]

## 2017-02-20 NOTE — Progress Notes (Signed)
CM consult for outpt psych resources for anxiety. CSW consult made as this is not an appropriate CM consult. Marney Doctor RN,BSN,NCM [phone]

## 2017-02-21 MED ORDER — LORAZEPAM 2 MG/ML IJ SOLN
1.0000 mg | Freq: Four times a day (QID) | INTRAMUSCULAR | Status: DC | PRN
  Administered 2017-02-21 – 2017-02-22 (×4): 1 mg via INTRAVENOUS
  Filled 2017-02-21 (×4): qty 1

## 2017-02-21 MED ORDER — LORAZEPAM 1 MG PO TABS
1.0000 mg | ORAL_TABLET | Freq: Four times a day (QID) | ORAL | Status: DC | PRN
  Administered 2017-02-22: 1 mg via ORAL
  Filled 2017-02-21: qty 1

## 2017-02-21 NOTE — Progress Notes (Signed)
Received report from Luetta Nutting, RN in ICU/SD. No change in previous assessment.

## 2017-02-21 NOTE — Progress Notes (Signed)
PROGRESS NOTE    Cereniti Curb  QVZ:[id number] DOB: [birth year] DOA: 02/16/2017 PCP: Leonides Sake, MD   Brief Narrative: Kristine Macias is a 31 y.o. female with a history of alcohol abuse, breast cancer, colitis, right ear deafness, depression, anxiety, seizure disorder, pancreas divisum. Patient presents with all withdrawal.   Assessment & Plan:   Principal Problem:   Alcohol withdrawal ([place]) Active Problems:   Generalized anxiety disorder   Alcohol abuse   Tobacco use   Alcohol withdrawal Patient with high CIWA scores requiring very frequent Ativan doses. Patient states that she would like to quit and is currently in a program. -Continue CIWA (med-surg) -Continue IV fluids, thiamine, folic acid, multivitamin -Transfer to telemetry  Generalized anxiety disorder Per patient, more of a social disorder. Patient currently does not have a psychiatrist. -Consult case management for psychiatry physician as an outpatient  Tobacco abuse Patient counseled on cessation   DVT prophylaxis: SCDs Code Status: Full code Family Communication: Family member sleeping at bedside Disposition Plan: Likely home in 24 hours   Consultants:   None  Procedures:   None  Antimicrobials:  None    Subjective: No nausea or vomiting. Headache.  Objective: Vitals:   02/21/17 0800 02/21/17 1000 02/21/17 1100 02/21/17 1221  BP: 132/79 117/64 125/63 113/85  Pulse: 78 65 98 84  Resp: (!) 21 15  16   Temp:    98 F (36.7 C)  TempSrc:    Oral  SpO2: 97% 96%  96%  Weight:      Height:        Intake/Output Summary (Last 24 hours) at 02/21/17 1400 Last data filed at 02/20/17 2100  Gross per 24 hour  Intake               60 ml  Output              200 ml  Net             -140 ml   Filed Weights   02/16/17 1553  Weight: 63.5 kg (140 lb)    Examination:  General exam: Appears calm and comfortable Respiratory system: Clear to auscultation. Respiratory effort  normal. Cardiovascular system: S1 & S2 heard, RRR. No murmurs. Gastrointestinal system: Abdomen is nondistended, soft and nontender. Normal bowel sounds heard. Central nervous system: Alert and oriented. No focal neurological deficits. Tremor that disappears at times. Extremities: No edema. No calf tenderness Skin: No cyanosis. No rashes Psychiatry: Judgement and insight appear normal. Mood & affect appropriate.     Data Reviewed: I have personally reviewed following labs and imaging studies  CBC:  Recent Labs Lab 02/16/17 1611 02/16/17 2208 02/17/17 0735  WBC 6.4  --  6.8  HGB 15.6* 15.1* 12.4  HCT 45.1 43.7 37.5  MCV 84.8  --  86.8  PLT 342  --  329   Basic Metabolic Panel:  Recent Labs Lab 02/16/17 1611 02/16/17 1900 02/17/17 0735 02/18/17 0352 02/20/17 0252  NA 134*  --  136 137 137  K 4.1  --  3.3* 3.8 3.5  CL 96*  --  102 108 106  CO2 22  --  26 24 23   GLUCOSE 93  --  107* 115* 92  BUN 12  --  12 <5* <5*  CREATININE 0.67  --  0.48 0.50 0.45  CALCIUM 9.2  --  7.7* 8.4* 9.0  MG  --  1.8  --  1.9  --   PHOS  --  3.2  --   --   --    GFR: Estimated Creatinine Clearance: 95.4 mL/min (by C-G formula based on SCr of 0.45 mg/dL). Liver Function Tests:  Recent Labs Lab 02/16/17 1611 02/17/17 0735  AST 119* 79*  ALT 77* 55*  ALKPHOS 88 69  BILITOT 0.8 1.3*  PROT 8.2* 6.5  ALBUMIN 4.4 3.4*   No results for input(s): LIPASE, AMYLASE in the last 168 hours. No results for input(s): AMMONIA in the last 168 hours. Coagulation Profile: No results for input(s): INR, PROTIME in the last 168 hours. Cardiac Enzymes: No results for input(s): CKTOTAL, CKMB, CKMBINDEX, TROPONINI in the last 168 hours. BNP (last 3 results) No results for input(s): PROBNP in the last 8760 hours. HbA1C: No results for input(s): HGBA1C in the last 72 hours. CBG: No results for input(s): GLUCAP in the last 168 hours. Lipid Profile: No results for input(s): CHOL, HDL, LDLCALC, TRIG,  CHOLHDL, LDLDIRECT in the last 72 hours. Thyroid Function Tests: No results for input(s): TSH, T4TOTAL, FREET4, T3FREE, THYROIDAB in the last 72 hours. Anemia Panel: No results for input(s): VITAMINB12, FOLATE, FERRITIN, TIBC, IRON, RETICCTPCT in the last 72 hours. Sepsis Labs: No results for input(s): PROCALCITON, LATICACIDVEN in the last 168 hours.  Recent Results (from the past 240 hour(s))  MRSA PCR Screening     Status: None   Collection Time: 02/17/17 12:44 PM  Result Value Ref Range Status   MRSA by PCR NEGATIVE NEGATIVE Final    Comment:        The GeneXpert MRSA Assay (FDA approved for NASAL specimens only), is one component of a comprehensive MRSA colonization surveillance program. It is not intended to diagnose MRSA infection nor to guide or monitor treatment for MRSA infections.          Radiology Studies: No results found.      Scheduled Meds: . enoxaparin (LOVENOX) injection  40 mg Subcutaneous Q24H  . folic acid  1 mg Oral Daily  . multivitamin with minerals  1 tablet Oral Daily  . pantoprazole  40 mg Oral BID  . thiamine  100 mg Oral Daily   Continuous Infusions:   LOS: 5 days     Cordelia Poche, MD [facility] 02/21/2017, 2:00 PM Pager: [phone]  If 7PM-7AM, please contact night-coverage [email or web address] Password [email or web address] 02/21/2017, 2:00 PM

## 2017-02-21 NOTE — Progress Notes (Signed)
Report given to Danae Chen, RN on 4E to transfer patient.  Vial signs stable, bed in lowest position, call bell within reach.

## 2017-02-22 MED ORDER — ONDANSETRON 4 MG PO TBDP: 4.0000 mg | ORAL_TABLET | Freq: Three times a day (TID) | ORAL | 0 refills | Status: DC | PRN

## 2017-02-22 NOTE — Discharge Instructions (Signed)
Alcohol Withdrawal Alcohol withdrawal is a group of symptoms that can develop when a person who drinks heavily and regularly stops drinking or drinks less. What are the causes? Heavy and regular drinking can cause chemicals that send signals from the brain to the body (neurotransmitters) to deactivate. Alcohol withdrawal develops when deactivated neurotransmitters reactivate because a person stops drinking or drinks less. What increases the risk? The more a person drinks and the longer he or she drinks, the greater the risk of alcohol withdrawal. Severe withdrawal is more likely to develop in someone who:  Had severe alcohol withdrawal in the past.  Had a seizure during a previous episode of alcohol withdrawal.  Is elderly.  Is pregnant.  Has been abusing drugs.  Has other medical problems, including: ? Infection. ? Heart, lung, or liver disease. ? Seizures. ? Mental health problems.  What are the signs or symptoms? Symptoms of this condition can be mild to moderate, or they can be severe. Mild to moderate symptoms may include:  Fatigue.  Nightmares.  Trouble sleeping.  Depression.  Anxiety.  Inability to think clearly.  Mood swings.  Irritability.  Loss of appetite.  Nausea or vomiting.  Clammy skin.  Extreme sweating.  Rapid heartbeat.  Shakiness.  Uncontrollable shaking (tremor).  Severe symptoms may include:  Fever.  Seizures.  Severeconfusion.  Feeling or seeing things that are not there (hallucinations).  Symptoms usually begin within eight hours after a person stops drinking or drinks less. They can last for weeks. How is this diagnosed? Alcohol withdrawal is diagnosed with a medical history and physical exam. Sometimes, urine and blood tests are also done. How is this treated? Treatment may involve:  Monitoring blood pressure, pulse, and breathing.  Getting fluids through an IV tube.  Medicine to reduce anxiety.  Medicine to  prevent or control seizures.  Multivitamins and B vitamins.  Having a health care provider check on you daily.  If symptoms are moderate to severe or if there is a risk of severe withdrawal, treatment may be done at a hospital or treatment center. Follow these instructions at home:  Take medicines and vitamin supplements only as directed by your health care provider.  Do not drink alcohol.  Have someone stay with you or be available if you need help.  Drink enough fluid to keep your urine clear or pale yellow.  Consider joining a 12-step program or another alcohol support group. Contact a health care provider if:  Your symptoms get worse or do not go away.  You cannot keep food or water in your stomach.  You are struggling with not drinking alcohol.  You cannot stop drinking alcohol. Get help right away if:  You have an irregular heartbeat.  You have chest pain.  You have trouble breathing.  You have symptoms of severe withdrawal, such as: ? A fever. ? Seizures. ? Severe confusion. ? Hallucinations. This information is not intended to replace advice given to you by your health care provider. Make sure you discuss any questions you have with your health care provider. Document Released: 05/23/2005 Document Revised: 12/21/2015 Document Reviewed: 06/01/2014 Elsevier Interactive Patient Education  [employer].

## 2017-02-22 NOTE — Care Management Note (Signed)
Case Management Note  Patient Details  Name: Kristine Macias MRN: [id number] Date of Birth: [birth year]  Subjective/Objective: 31 y/o f admitted w/etoh w/drawal. From home. CSW provided otpt resources.No CM needs.                   Action/Plan:d/c home.   Expected Discharge Date:  02/22/17               Expected Discharge Plan:  Home/Self Care  In-House Referral:     Discharge planning Services  CM Consult  Post Acute Care Choice:    Choice offered to:     DME Arranged:    DME Agency:     HH Arranged:    HH Agency:     Status of Service:  Completed, signed off  If discussed at [employer] of Stay Meetings, dates discussed:    Additional Comments:  Dessa Phi, RN 02/22/2017, 11:48 AM

## 2017-02-22 NOTE — Progress Notes (Signed)
Patient is Alert and Oriented x4, ambulatory without assist. Discharge instructions reviewed. Questions concerns denied r/t instruction. No change from am assessment. Resource sheet provided via SW.

## 2017-02-22 NOTE — Discharge Summary (Signed)
Physician Discharge Summary  Kristine Macias YNW:[id number] DOB: 19-Mar-1986 DOA: 02/16/2017  PCP: Leonides Sake, MD  Admit date: 02/16/2017 Discharge date: 02/22/2017  Admitted From: Home Disposition: Home  Recommendations for Outpatient Follow-up:  1. Follow up with PCP in 1 week 2. Patient needs ongoing counseling  Discharge Condition: Stable CODE STATUS: Full code Diet recommendation: Regular diet   Brief/Interim Summary:  Admission HPI written by Reubin Milan, MD   Chief Complaint: Alcohol withdrawal  HPI: Kristine Macias is a 31 y.o. female with medical history significant of alcohol abuse, alcoholic as proctitis, breast cancer, history of colitis, right ear deafness, depression with anxiety, history of malnutrition, pancreas divisum, history of a spontaneous tension pneumothorax, seizure disorder, recently admitted and discharged earlier this month for alcohol withdrawal, who comes to the emergency department with complaints of nausea, emesis, headache, generalized pain, blurred vision and alcohol withdrawal seizure today in the morning. She mentions that she ran out of her lorazepam and started drinking again for the past few days to avoid withdrawal symptoms and seizures. She denies fever, but feels chills and fatigue. She denies dyspnea, chest pain, dizziness, diaphoresis, pitting edema of the lower extremities, but complains of palpitations. She says that since yesterday she has been having nausea and multiple episodes of emesis associated with mild epigastric pain and several episodes of diarrhea as well. She denies melena or hematochezia.   ED Course: The patient was very tremulous when seen. Her initial vital signs were afebrile, pulse 107, respirations 20, blood pressure 134/110 mmHg and O2 sat of 97% on room air. She received a normal saline bolus, IV Zofran and multiple rounds of IV lorazepam with partial report of relief.  Lab work shows WBC is 6.4,  hemoglobin 15.6 g/dL and platelets 342. Sodium 134, potassium 4.1, chloride 96, bicarbonate 22 mmol/L. BUN was 12, creatinine 0.67, ethanol 208, glucose 93, magnesium 1.8 and phosphorus 3.2 mg/dL. She had mild transaminitis, but otherwise her LFTs were unremarkable.    Hospital course:  Alcohol withdrawal Patient was started on CIWA. Patient initially was requiring a lot of Ativan for symptoms. Later in admission, symptoms did not seem consistent with alcohol withdrawal. Tremor is not consistent. Ativan dosages were decreased significantly. And patient was stable. Patient feels that her coming off of Klonopin was significant factor in her return to the hospital. Discussed with the patient that this needs to be handled by her primary care physician, for which she apparently has been fired. Will not prescribe any benzodiazepines on discharge and this was discussed with the patient.  Generalized anxiety disorder Per patient, more of a social disorder. Patient currently does not have a psychiatrist. Resources given to patient for outpatient psychiatrist.  Tobacco abuse Patient counseled on cessation  Discharge Diagnoses:  Principal Problem:   Alcohol withdrawal ([place]) Active Problems:   Generalized anxiety disorder   Alcohol abuse   Tobacco use    Discharge Instructions  Discharge Instructions    Call MD for:  extreme fatigue    Complete by:  As directed    Call MD for:  persistant dizziness or light-headedness    Complete by:  As directed    Call MD for:  persistant nausea and vomiting    Complete by:  As directed    Call MD for:  temperature >100.4    Complete by:  As directed    Diet - low sodium heart healthy    Complete by:  As directed    Increase activity  slowly    Complete by:  As directed      Allergies as of 02/22/2017      Reactions   Aspirin Shortness Of Breath   Effexor [venlafaxine] Anaphylaxis   Gabapentin Anaphylaxis   Libritabs [chlordiazepoxide]  Anaphylaxis   Tolerates lorazepam and clonazepam   Sulfa Antibiotics Anaphylaxis   Tetracyclines & Related Anaphylaxis   Trazodone And Nefazodone Anaphylaxis   Latex Hives, Itching   Paxil [paroxetine] Hives, Itching   Seroquel [quetiapine Fumarate] Hives, Itching   Zoloft [sertraline Hcl] Hives, Itching      Medication List    STOP taking these medications   chlordiazePOXIDE 25 MG capsule Commonly known as:  LIBRIUM   clonazePAM 0.5 MG tablet Commonly known as:  KLONOPIN     TAKE these medications   LORazepam 1 MG tablet Commonly known as:  ATIVAN Take 1 tablet (1 mg total) by mouth every 8 (eight) hours as needed for anxiety.   Melatonin 1 MG Caps Take 1 mg by mouth at bedtime as needed. sleep   multivitamin with minerals Tabs tablet Take 1 tablet by mouth daily.   ondansetron 4 MG disintegrating tablet Commonly known as:  ZOFRAN ODT Take 1 tablet (4 mg total) by mouth every 8 (eight) hours as needed for nausea or vomiting.   thiamine 100 MG tablet Take 1 tablet (100 mg total) by mouth daily.      Follow-up Information    Medicaid appointed PCP. Schedule an appointment as soon as possible for a visit in 1 week(s).          Allergies  Allergen Reactions  . Aspirin Shortness Of Breath  . Effexor [Venlafaxine] Anaphylaxis  . Gabapentin Anaphylaxis  . Libritabs [Chlordiazepoxide] Anaphylaxis    Tolerates lorazepam and clonazepam  . Sulfa Antibiotics Anaphylaxis  . Tetracyclines & Related Anaphylaxis  . Trazodone And Nefazodone Anaphylaxis  . Latex Hives and Itching  . Paxil [Paroxetine] Hives and Itching  . Seroquel [Quetiapine Fumarate] Hives and Itching  . Zoloft [Sertraline Hcl] Hives and Itching    Consultations:  None   Procedures/Studies:  No results found.   Subjective: Reports an episode of nausea and emesis this morning, however, there is no documentation. Not currently nauseated.  Discharge Exam: Vitals:   02/21/17 2347 02/22/17  0606  BP: 119/80 (!) 99/58  Pulse: 81 (!) 58  Resp: 18 16  Temp: 98.3 F (36.8 C) 97.7 F (36.5 C)   Vitals:   02/21/17 1100 02/21/17 1221 02/21/17 2347 02/22/17 0606  BP: 125/63 113/85 119/80 (!) 99/58  Pulse: 98 84 81 (!) 58  Resp:  16 18 16   Temp:  98 F (36.7 C) 98.3 F (36.8 C) 97.7 F (36.5 C)  TempSrc:  Oral Oral Oral  SpO2:  96% 98% 99%  Weight:      Height:        General: Pt is alert, awake, not in acute distress Cardiovascular: RRR, S1/S2 +, no rubs, no gallops Respiratory: CTA bilaterally, no wheezing, no rhonchi Abdominal: Soft, NT, ND, bowel sounds + Extremities: no edema, no cyanosis    The results of significant diagnostics from this hospitalization (including imaging, microbiology, ancillary and laboratory) are listed below for reference.     Microbiology: Recent Results (from the past 240 hour(s))  MRSA PCR Screening     Status: None   Collection Time: 02/17/17 12:44 PM  Result Value Ref Range Status   MRSA by PCR NEGATIVE NEGATIVE Final    Comment:  The GeneXpert MRSA Assay (FDA approved for NASAL specimens only), is one component of a comprehensive MRSA colonization surveillance program. It is not intended to diagnose MRSA infection nor to guide or monitor treatment for MRSA infections.      Labs: BNP (last 3 results) No results for input(s): BNP in the last 8760 hours. Basic Metabolic Panel:  Recent Labs Lab 02/16/17 1611 02/16/17 1900 02/17/17 0735 02/18/17 0352 02/20/17 0252  NA 134*  --  136 137 137  K 4.1  --  3.3* 3.8 3.5  CL 96*  --  102 108 106  CO2 22  --  26 24 23   GLUCOSE 93  --  107* 115* 92  BUN 12  --  12 <5* <5*  CREATININE 0.67  --  0.48 0.50 0.45  CALCIUM 9.2  --  7.7* 8.4* 9.0  MG  --  1.8  --  1.9  --   PHOS  --  3.2  --   --   --    Liver Function Tests:  Recent Labs Lab 02/16/17 1611 02/17/17 0735  AST 119* 79*  ALT 77* 55*  ALKPHOS 88 69  BILITOT 0.8 1.3*  PROT 8.2* 6.5  ALBUMIN 4.4  3.4*   No results for input(s): LIPASE, AMYLASE in the last 168 hours. No results for input(s): AMMONIA in the last 168 hours. CBC:  Recent Labs Lab 02/16/17 1611 02/16/17 2208 02/17/17 0735  WBC 6.4  --  6.8  HGB 15.6* 15.1* 12.4  HCT 45.1 43.7 37.5  MCV 84.8  --  86.8  PLT 342  --  250   Urinalysis    Component Value Date/Time   COLORURINE YELLOW 01/28/2017 1125   APPEARANCEUR CLEAR 01/28/2017 1125   APPEARANCEUR Clear 02/15/2016 1456   LABSPEC 1.004 (L) 01/28/2017 1125   LABSPEC 1.006 12/20/2014 1018   PHURINE 7.0 01/28/2017 1125   GLUCOSEU NEGATIVE 01/28/2017 1125   GLUCOSEU Negative 12/20/2014 1018   HGBUR NEGATIVE 01/28/2017 1125   BILIRUBINUR NEGATIVE 01/28/2017 1125   BILIRUBINUR neg 03/13/2016 1700   BILIRUBINUR Negative 02/15/2016 1456   BILIRUBINUR Negative 12/20/2014 1018   KETONESUR NEGATIVE 01/28/2017 1125   PROTEINUR NEGATIVE 01/28/2017 1125   UROBILINOGEN negative 03/13/2016 1700   UROBILINOGEN 0.2 06/13/2012 2306   NITRITE NEGATIVE 01/28/2017 1125   LEUKOCYTESUR TRACE (A) 01/28/2017 1125   LEUKOCYTESUR Negative 02/15/2016 1456   LEUKOCYTESUR Negative 12/20/2014 1018   Microbiology Recent Results (from the past 240 hour(s))  MRSA PCR Screening     Status: None   Collection Time: 02/17/17 12:44 PM  Result Value Ref Range Status   MRSA by PCR NEGATIVE NEGATIVE Final    Comment:        The GeneXpert MRSA Assay (FDA approved for NASAL specimens only), is one component of a comprehensive MRSA colonization surveillance program. It is not intended to diagnose MRSA infection nor to guide or monitor treatment for MRSA infections.     SIGNED:   Cordelia Poche, MD [facility] 02/22/2017, 11:46 AM Pager [phone]  If 7PM-7AM, please contact night-coverage [email or web address] Password [email or web address]

## 2017-03-04 ENCOUNTER — Inpatient Hospital Stay (HOSPITAL_COMMUNITY)
Admission: EM | Admit: 2017-03-04 | Discharge: 2017-03-08 | DRG: 897 | Disposition: A | Discharge status: Home / Self Care | Payer: Medicaid Other | Attending: Internal Medicine | Admitting: Internal Medicine

## 2017-03-04 ENCOUNTER — Encounter (HOSPITAL_COMMUNITY): Payer: Self-pay | Admitting: Family Medicine

## 2017-03-04 DIAGNOSIS — Z720 Tobacco use: Secondary | ICD-10-CM | POA: Diagnosis not present

## 2017-03-04 DIAGNOSIS — F411 Generalized anxiety disorder: Secondary | ICD-10-CM | POA: Diagnosis present

## 2017-03-04 DIAGNOSIS — F10221 Alcohol dependence with intoxication delirium: Principal | ICD-10-CM | POA: Diagnosis present

## 2017-03-04 DIAGNOSIS — H9042 Sensorineural hearing loss, unilateral, left ear, with unrestricted hearing on the contralateral side: Secondary | ICD-10-CM | POA: Diagnosis present

## 2017-03-04 DIAGNOSIS — R569 Unspecified convulsions: Secondary | ICD-10-CM | POA: Diagnosis not present

## 2017-03-04 DIAGNOSIS — F10239 Alcohol dependence with withdrawal, unspecified: Secondary | ICD-10-CM | POA: Diagnosis present

## 2017-03-04 DIAGNOSIS — Y906 Blood alcohol level of 120-199 mg/100 ml: Secondary | ICD-10-CM | POA: Diagnosis present

## 2017-03-04 DIAGNOSIS — F10231 Alcohol dependence with withdrawal delirium: Secondary | ICD-10-CM | POA: Diagnosis present

## 2017-03-04 DIAGNOSIS — F19230 Other psychoactive substance dependence with withdrawal, uncomplicated: Secondary | ICD-10-CM

## 2017-03-04 DIAGNOSIS — F10939 Alcohol use, unspecified with withdrawal, unspecified: Secondary | ICD-10-CM | POA: Diagnosis present

## 2017-03-04 DIAGNOSIS — F10930 Alcohol use, unspecified with withdrawal, uncomplicated: Secondary | ICD-10-CM

## 2017-03-04 DIAGNOSIS — F4010 Social phobia, unspecified: Secondary | ICD-10-CM | POA: Diagnosis present

## 2017-03-04 DIAGNOSIS — R000 Tachycardia, unspecified: Secondary | ICD-10-CM | POA: Diagnosis not present

## 2017-03-04 DIAGNOSIS — F10230 Alcohol dependence with withdrawal, uncomplicated: Secondary | ICD-10-CM

## 2017-03-04 DIAGNOSIS — F17210 Nicotine dependence, cigarettes, uncomplicated: Secondary | ICD-10-CM | POA: Diagnosis present

## 2017-03-04 DIAGNOSIS — F329 Major depressive disorder, single episode, unspecified: Secondary | ICD-10-CM | POA: Diagnosis present

## 2017-03-04 DIAGNOSIS — F19239 Other psychoactive substance dependence with withdrawal, unspecified: Secondary | ICD-10-CM

## 2017-03-04 LAB — BASIC METABOLIC PANEL WITH GFR
Anion gap: 16 — ABNORMAL HIGH (ref 5–15)
BUN: 8 mg/dL (ref 6–20)
CO2: 19 mmol/L — ABNORMAL LOW (ref 22–32)
Calcium: 9 mg/dL (ref 8.9–10.3)
Chloride: 100 mmol/L — ABNORMAL LOW (ref 101–111)
Creatinine, Ser: 0.57 mg/dL (ref 0.44–1.00)
GFR calc Af Amer: >60 mL/min
GFR calc non Af Amer: >60 mL/min
Glucose, Bld: 113 mg/dL — ABNORMAL HIGH (ref 65–99)
Potassium: 3.8 mmol/L (ref 3.5–5.1)
Sodium: 135 mmol/L (ref 135–145)

## 2017-03-04 LAB — URINALYSIS, ROUTINE W REFLEX MICROSCOPIC
Bilirubin Urine: NEGATIVE
Glucose, UA: NEGATIVE mg/dL
KETONES UR: NEGATIVE mg/dL
Leukocytes, UA: NEGATIVE
NITRITE: NEGATIVE
PROTEIN: 30 mg/dL — AB
Specific Gravity, Urine: 1.004 — ABNORMAL LOW (ref 1.005–1.030)
pH: 6 (ref 5.0–8.0)

## 2017-03-04 LAB — CBG MONITORING, ED: Glucose-Capillary: 130 mg/dL — ABNORMAL HIGH (ref 65–99)

## 2017-03-04 LAB — CBC WITH DIFFERENTIAL/PLATELET
Basophils Absolute: 0.1 K/uL (ref 0.0–0.1)
Basophils Relative: 1 %
Eosinophils Absolute: 0.1 K/uL (ref 0.0–0.7)
Eosinophils Relative: 1 %
HCT: 44.1 % (ref 36.0–46.0)
Hemoglobin: 15.5 g/dL — ABNORMAL HIGH (ref 12.0–15.0)
Lymphocytes Relative: 30 %
Lymphs Abs: 2.4 K/uL (ref 0.7–4.0)
MCH: 29.9 pg (ref 26.0–34.0)
MCHC: 35.1 g/dL (ref 30.0–36.0)
MCV: 85.1 fL (ref 78.0–100.0)
Monocytes Absolute: 0.6 K/uL (ref 0.1–1.0)
Monocytes Relative: 8 %
Neutro Abs: 4.9 K/uL (ref 1.7–7.7)
Neutrophils Relative %: 60 %
Platelets: 315 K/uL (ref 150–400)
RBC: 5.18 MIL/uL — ABNORMAL HIGH (ref 3.87–5.11)
RDW: 15.7 % — ABNORMAL HIGH (ref 11.5–15.5)
WBC: 8.1 K/uL (ref 4.0–10.5)

## 2017-03-04 LAB — I-STAT BETA HCG BLOOD, ED (MC, WL, AP ONLY): I-stat hCG, quantitative: <5 m[IU]/mL (ref <5)

## 2017-03-04 LAB — RAPID URINE DRUG SCREEN, HOSP PERFORMED
Amphetamines: NOT DETECTED
Barbiturates: NOT DETECTED
Benzodiazepines: POSITIVE — AB
Cocaine: NOT DETECTED
Opiates: NOT DETECTED
Tetrahydrocannabinol: NOT DETECTED

## 2017-03-04 LAB — ACETAMINOPHEN LEVEL: Acetaminophen (Tylenol), Serum: <10 ug/mL — ABNORMAL LOW (ref 10–30)

## 2017-03-04 LAB — SALICYLATE LEVEL: Salicylate Lvl: <7 mg/dL (ref 2.8–30.0)

## 2017-03-04 LAB — ETHANOL: Alcohol, Ethyl (B): 196 mg/dL — ABNORMAL HIGH

## 2017-03-04 MED ORDER — SODIUM CHLORIDE 0.9 % IV SOLN
INTRAVENOUS | Status: DC
  Administered 2017-03-04: 19:00:00 via INTRAVENOUS

## 2017-03-04 MED ORDER — LORAZEPAM 2 MG/ML IJ SOLN
2.0000 mg | INTRAMUSCULAR | Status: DC | PRN
  Administered 2017-03-04 – 2017-03-05 (×4): 2 mg via INTRAVENOUS
  Filled 2017-03-04 (×4): qty 1

## 2017-03-04 MED ORDER — LORAZEPAM 2 MG/ML IJ SOLN: 1.0000 mg | Freq: Once | INTRAMUSCULAR | Status: DC | PRN

## 2017-03-04 MED ORDER — THIAMINE HCL 100 MG/ML IJ SOLN
Freq: Once | INTRAVENOUS | Status: AC
  Administered 2017-03-04: 19:00:00 via INTRAVENOUS
  Filled 2017-03-04: qty 1000

## 2017-03-04 NOTE — ED Triage Notes (Signed)
Per EMS, pt from home.  Pt c/o hx of micro seizure.  Pt states she feels she had one today. Pt has hx of same. Pt was not postictal by EMS.  Pt also wanting help with ETOH.  Vitals: 142/96, hr 109, resp 18, 96% ra, cbg 122

## 2017-03-04 NOTE — H&P (Signed)
[facility] History and Physical  Ericka Marcellus SHF:[id number] DOB: 1985/11/05 DOA: 03/04/2017  PCP: Leonides Sake, MD  Patient coming from: Home  Chief Complaint: Seizure  HPI: Kristine Macias is a 31 y.o. female with a medical history of alcoholism with withdrawal symptoms, depression and anxiety, who presented to the emergency department with complaints of seizure. Patient recently admitted on 02/16/2018 and discharge 02/22/2017 for similar episode. Patient states she drinks approximately 9 large cans of beer containing 8% alcohol, last use was 24 hours ago. Currently feels very shaky and wondering if she will be getting more than just Ativan. Patient also complains of nausea but denies any current vomiting. Currently denies any chest pain, shortness of breath, abdominal pain, vomiting, diarrhea or constipation, recent illness or travel.  ED Course: Found to have of alcohol level 129, with suppose it seizure per patient. Patient given Ativan. Tear H called for admission.  Review of Systems:  All other systems reviewed and are negative.   Past Medical History:  Diagnosis Date  . Alcohol abuse 11/2014   drinking since age 9. chronic, recurrent. at least 2 detox admits before 2015.   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   . Breast cancer [facility])    age 17, lump removed from left breast  . Chlamydia 2007   bacterial vaginosis 09/2011  . Colitis 12/2014   colonoscopy for diarrhea and abnml CT 01/06/15: erythmatous TI (path: active ileitis, ? emerging IBD), rectal erythema (path: mucosal prolapse). Random bx of normal colon (path unremarkable)   . Congenital deafness    left ear only.  Also noted in mother and sibling (unilateral).   . Depression with anxiety initally at age 7  . Failure to thrive in adult 12/2014.    Malnutrition: n/v, not eating, weight loss, BMI 14.  s/p 01/11/2015 PEG (Dr Dorna Leitz).   . Hypertensive urgency   . Irregular  heart beat 2010   wt/diet related after evaluation  . Pancreas divisum 02/2015   Type 1 seen on CT.   Marland Kitchen Pneumothorax, spontaneous, tension   . Renal disorder   . Seizure ([place]) 06/2015   due to ETOH/benzo withdrawal.   . Spontaneous pneumothorax 08/2012   right.  chest tube placed.   . Thrombocytopenia ([place]) 04/2007    Past Surgical History:  Procedure Laterality Date  . CESAREAN SECTION  07/2009    x 1.  G3, Para 2012.   . CHEST TUBE INSERTION    . COLONOSCOPY WITH PROPOFOL N/A 01/06/2015   [facility], Dr Rayann Heman. colonoscopy for diarrhea and abnml CT 01/06/15: erythmatous TI (path: active ileitis, ? emerging IBD), rectal erythema (path: mucosal prolapse). Random bx of normal colon (path unremarkable)   . ESOPHAGOGASTRODUODENOSCOPY N/A 01/06/2015   ;[facility], Dr Rayann Heman. For wt loss, N/V: Normal study, duodenal biopsy/pathology: chronic active duodenitis.   Marland Kitchen ESOPHAGOGASTRODUODENOSCOPY N/A 01/11/2015   Rein-normal with PEG placement  . PEG PLACEMENT N/A 01/11/2015   [facility], Dr Rayann Heman.  for N/V/wt loss/severe malnutrition.     Social History:  reports that she has been smoking Cigarettes.  She has a 13.00 pack-year smoking history. She has never used smokeless tobacco. She reports that she drinks about 21.6 oz of alcohol per week . She reports that she does not use drugs.   Allergies  Allergen Reactions  . Aspirin Shortness Of Breath  . Effexor [Venlafaxine] Anaphylaxis  . Gabapentin Anaphylaxis  . Libritabs [Chlordiazepoxide] Anaphylaxis    Tolerates lorazepam and clonazepam  .  Sulfa Antibiotics Anaphylaxis  . Tetracyclines & Related Anaphylaxis  . Trazodone And Nefazodone Anaphylaxis  . Latex Hives and Itching  . Paxil [Paroxetine] Hives and Itching  . Seroquel [Quetiapine Fumarate] Hives and Itching  . Zoloft [Sertraline Hcl] Hives and Itching    Family History  Problem Relation Age of Onset  . Thyroid disease Mother   . Cancer Mother        breast cancer  . Cancer Father        lung  cancer  . Stroke Other   . Crohn's disease Brother   . Cancer Maternal Grandmother        breast cancer  . Anesthesia problems Neg Hx      Prior to Admission medications   Medication Sig Start Date End Date Taking? Authorizing Provider  LORazepam (ATIVAN) 1 MG tablet Take 1 tablet (1 mg total) by mouth every 8 (eight) hours as needed for anxiety. 02/03/17 02/03/18  Hosie Poisson, MD  Melatonin 1 MG CAPS Take 1 mg by mouth at bedtime as needed. sleep    [provider]  Multiple Vitamin (MULTIVITAMIN WITH MINERALS) TABS tablet Take 1 tablet by mouth daily. Patient not taking: Reported on 02/16/2017 01/21/17   Murlean Iba, MD  ondansetron (ZOFRAN ODT) 4 MG disintegrating tablet Take 1 tablet (4 mg total) by mouth every 8 (eight) hours as needed for nausea or vomiting. 02/22/17   Mariel Aloe, MD  thiamine 100 MG tablet Take 1 tablet (100 mg total) by mouth daily. Patient not taking: Reported on 02/16/2017 12/14/16   Bonnielee Haff, MD    Physical Exam: Vitals:   03/04/17 1930 03/04/17 1941  BP: (!) 139/98 (!) 139/98  Pulse: (!) 105 (!) 110  Resp: 14   Temp:       General: Well developed, well nourished, NAD, appears stated age  HEENT: NCAT, PERRLA, EOMI, Anicteic Sclera, mucous membranes moist.   Neck: Supple, no JVD, no masses  Cardiovascular: S1 S2 auscultated, no rubs, murmurs or gallops. Tachycardic  Respiratory: Clear to auscultation bilaterally with equal chest rise  Abdomen: Soft, nontender, nondistended, + bowel sounds  Extremities: warm dry without cyanosis clubbing or edema  Neuro: AAOx3, cranial nerves grossly intact. Strength 5/5 in patient's upper and lower extremities bilaterally. Tremulous  Skin: Without rashes exudates or nodules  Psych: anxious affect and demeanor  Labs on Admission: I have personally reviewed following labs and imaging studies CBC:  Recent Labs Lab 03/04/17 1826  WBC 8.1  NEUTROABS 4.9  HGB 15.5*  HCT 44.1  MCV  85.1  PLT 725   Basic Metabolic Panel:  Recent Labs Lab 03/04/17 1826  NA 135  K 3.8  CL 100*  CO2 19*  GLUCOSE 113*  BUN 8  CREATININE 0.57  CALCIUM 9.0   GFR: Estimated Creatinine Clearance: 95.4 mL/min (by C-G formula based on SCr of 0.57 mg/dL). Liver Function Tests: No results for input(s): AST, ALT, ALKPHOS, BILITOT, PROT, ALBUMIN in the last 168 hours. No results for input(s): LIPASE, AMYLASE in the last 168 hours. No results for input(s): AMMONIA in the last 168 hours. Coagulation Profile: No results for input(s): INR, PROTIME in the last 168 hours. Cardiac Enzymes: No results for input(s): CKTOTAL, CKMB, CKMBINDEX, TROPONINI in the last 168 hours. BNP (last 3 results) No results for input(s): PROBNP in the last 8760 hours. HbA1C: No results for input(s): HGBA1C in the last 72 hours. CBG:  Recent Labs Lab 03/04/17 1827  GLUCAP 130*  Lipid Profile: No results for input(s): CHOL, HDL, LDLCALC, TRIG, CHOLHDL, LDLDIRECT in the last 72 hours. Thyroid Function Tests: No results for input(s): TSH, T4TOTAL, FREET4, T3FREE, THYROIDAB in the last 72 hours. Anemia Panel: No results for input(s): VITAMINB12, FOLATE, FERRITIN, TIBC, IRON, RETICCTPCT in the last 72 hours. Urine analysis:    Component Value Date/Time   COLORURINE YELLOW 03/04/2017 1826   APPEARANCEUR HAZY (A) 03/04/2017 1826   APPEARANCEUR Clear 02/15/2016 1456   LABSPEC 1.004 (L) 03/04/2017 1826   LABSPEC 1.006 12/20/2014 1018   PHURINE 6.0 03/04/2017 1826   GLUCOSEU NEGATIVE 03/04/2017 1826   GLUCOSEU Negative 12/20/2014 1018   HGBUR SMALL (A) 03/04/2017 1826   BILIRUBINUR NEGATIVE 03/04/2017 1826   BILIRUBINUR neg 03/13/2016 1700   BILIRUBINUR Negative 02/15/2016 1456   BILIRUBINUR Negative 12/20/2014 1018   KETONESUR NEGATIVE 03/04/2017 1826   PROTEINUR 30 (A) 03/04/2017 1826   UROBILINOGEN negative 03/13/2016 1700   UROBILINOGEN 0.2 06/13/2012 2306   NITRITE NEGATIVE 03/04/2017 1826    LEUKOCYTESUR NEGATIVE 03/04/2017 1826   LEUKOCYTESUR Negative 02/15/2016 1456   LEUKOCYTESUR Negative 12/20/2014 1018   Sepsis Labs: @LABRCNTIP (procalcitonin:4,lacticidven:4) )No results found for this or any previous visit (from the past 240 hour(s)).   Radiological Exams on Admission: No results found.  EKG: Independently reviewed. Sinus tachycardia, rate 121  Assessment/Plan  Alcohol withdrawal with questionable seizure/DTs -Patient admits to having auditory hallucinations -Alcohol level 196 -Currently have a tremor -Will admit to stepdown and placed on CIWA protocol -Continue home dose of Ativan -Placed on banana bag followed by oral thiamine, folic acid and multivitamin  Tobacco abuse -States she recently quit smoking, does not feel she needs nicotine patch  Generalized Anxiety disorder -Patient self medicates herself with alcohol and has been doing this since loss of her father -Treatment plan as above -Patient likely needs a behavioral health follow-up  Sinus Tachycardia -Likely secondary to the above -Patient will be placed in stepdown monitored -TSH recently checked 01/20/2017, 2.216 -Electrolytes within normal limits -Will add on magnesium level  Nausea -Likely secondary to the above, will place on antiemetics as needed  DVT prophylaxis: Lovenox  Code Status: full  Family Communication: None at bedside. Admission, patients condition and plan of care including tests being ordered have been discussed with the patient, who indicates understanding and agrees with the plan and Code Status.  Disposition Plan: Home  Consults called: None   Admission status: Inpatient, stepdown   Time spent: 65 minutes  Algie Westry D.O. [facility] Pager [phone]  If 7PM-7AM, please contact night-coverage [email or web address] Password [email or web address] 03/04/2017, 8:30 PM
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
## 2017-03-05 LAB — COMPREHENSIVE METABOLIC PANEL
ALT: 44 U/L (ref 14–54)
ANION GAP: 10 (ref 5–15)
AST: 84 U/L — ABNORMAL HIGH (ref 15–41)
Albumin: 3.7 g/dL (ref 3.5–5.0)
Alkaline Phosphatase: 73 U/L (ref 38–126)
BILIRUBIN TOTAL: 1.3 mg/dL — AB (ref 0.3–1.2)
BUN: 6 mg/dL (ref 6–20)
CO2: 23 mmol/L (ref 22–32)
Calcium: 7.7 mg/dL — ABNORMAL LOW (ref 8.9–10.3)
Chloride: 105 mmol/L (ref 101–111)
Creatinine, Ser: 0.56 mg/dL (ref 0.44–1.00)
GFR calc Af Amer: >60 mL/min (ref >60)
Glucose, Bld: 81 mg/dL (ref 65–99)
POTASSIUM: 4.9 mmol/L (ref 3.5–5.1)
Sodium: 138 mmol/L (ref 135–145)
TOTAL PROTEIN: 6.9 g/dL (ref 6.5–8.1)

## 2017-03-05 LAB — CBC
HEMATOCRIT: 39.2 % (ref 36.0–46.0)
HEMOGLOBIN: 13.1 g/dL (ref 12.0–15.0)
MCH: 28.9 pg (ref 26.0–34.0)
MCHC: 33.4 g/dL (ref 30.0–36.0)
MCV: 86.5 fL (ref 78.0–100.0)
Platelets: 263 10*3/uL (ref 150–400)
RBC: 4.53 MIL/uL (ref 3.87–5.11)
RDW: 16.3 % — ABNORMAL HIGH (ref 11.5–15.5)
WBC: 9.2 10*3/uL (ref 4.0–10.5)

## 2017-03-05 MED ORDER — ADULT MULTIVITAMIN W/MINERALS CH
1.0000 | ORAL_TABLET | Freq: Every day | ORAL | Status: DC
  Filled 2017-03-05: qty 1

## 2017-03-05 MED ORDER — LORAZEPAM 1 MG PO TABS
1.0000 mg | ORAL_TABLET | Freq: Three times a day (TID) | ORAL | Status: DC | PRN
  Administered 2017-03-05: 1 mg via ORAL
  Filled 2017-03-05: qty 1

## 2017-03-05 MED ORDER — ONDANSETRON HCL 4 MG/2ML IJ SOLN
4.0000 mg | Freq: Four times a day (QID) | INTRAMUSCULAR | Status: DC | PRN
  Administered 2017-03-05: 4 mg via INTRAVENOUS
  Filled 2017-03-05: qty 2

## 2017-03-05 MED ORDER — ONDANSETRON HCL 4 MG PO TABS: 4.0000 mg | ORAL_TABLET | Freq: Four times a day (QID) | ORAL | Status: DC | PRN

## 2017-03-05 MED ORDER — ACETAMINOPHEN 650 MG RE SUPP: 650.0000 mg | Freq: Four times a day (QID) | RECTAL | Status: DC | PRN

## 2017-03-05 MED ORDER — ACETAMINOPHEN 325 MG PO TABS
650.0000 mg | ORAL_TABLET | Freq: Four times a day (QID) | ORAL | Status: DC | PRN
  Administered 2017-03-07: 650 mg via ORAL
  Filled 2017-03-05: qty 2

## 2017-03-05 MED ORDER — ONDANSETRON 4 MG PO TBDP
4.0000 mg | ORAL_TABLET | Freq: Three times a day (TID) | ORAL | Status: DC | PRN
  Administered 2017-03-05: 4 mg via ORAL
  Filled 2017-03-05: qty 1

## 2017-03-05 MED ORDER — VITAMIN B-1 100 MG PO TABS
100.0000 mg | ORAL_TABLET | Freq: Every day | ORAL | Status: DC
  Administered 2017-03-05 – 2017-03-08 (×4): 100 mg via ORAL
  Filled 2017-03-05 (×4): qty 1

## 2017-03-05 MED ORDER — FOLIC ACID 1 MG PO TABS
1.0000 mg | ORAL_TABLET | Freq: Every day | ORAL | Status: DC
  Administered 2017-03-05 – 2017-03-08 (×4): 1 mg via ORAL
  Filled 2017-03-05 (×4): qty 1

## 2017-03-05 MED ORDER — ADULT MULTIVITAMIN W/MINERALS CH
1.0000 | ORAL_TABLET | Freq: Every day | ORAL | Status: DC
  Administered 2017-03-05 – 2017-03-08 (×4): 1 via ORAL
  Filled 2017-03-05 (×3): qty 1

## 2017-03-05 MED ORDER — ENOXAPARIN SODIUM 40 MG/0.4ML ~~LOC~~ SOLN
40.0000 mg | SUBCUTANEOUS | Status: DC
  Administered 2017-03-05 – 2017-03-08 (×4): 40 mg via SUBCUTANEOUS
  Filled 2017-03-05 (×4): qty 0.4

## 2017-03-05 MED ORDER — THIAMINE HCL 100 MG/ML IJ SOLN
Freq: Once | INTRAVENOUS | Status: AC
  Administered 2017-03-05: 03:00:00 via INTRAVENOUS
  Filled 2017-03-05: qty 1000

## 2017-03-05 MED ORDER — LORAZEPAM 2 MG/ML IJ SOLN
2.0000 mg | INTRAMUSCULAR | Status: DC | PRN
  Administered 2017-03-05 – 2017-03-08 (×21): 2 mg via INTRAVENOUS
  Filled 2017-03-05 (×21): qty 1

## 2017-03-05 NOTE — Progress Notes (Signed)
PROGRESS NOTE    Kristine Macias  OIN:[id number] DOB: [birth year] DOA: 03/04/2017 PCP: Leonides Sake, MD   Brief Narrative: Kristine Macias is a 31 y.o. female with a medical history of alcoholism with withdrawal symptoms, depression and anxiety, who presented to the emergency department with complaints of seizure. Patient recently admitted on 02/16/2018 and discharge 02/22/2017 for similar episode. Patient states she drinks approximately 9 large cans of beer containing 8% alcohol, last use was 24 hours ago. Currently feels very shaky and wondering if she will be getting more than just Ativan. Patient also complains of nausea but denies any current vomiting. Currently denies any chest pain, shortness of breath, abdominal pain, vomiting, diarrhea or constipation, recent illness or travel.  ED Course: Found to have of alcohol level 129, with suppose it seizure per patient. Patient given Ativan. Tear H called for admission.   Assessment & Plan:   Active Problems:   Generalized anxiety disorder   Tobacco use   Withdrawal seizures ([place])   Alcohol withdrawal ([place])  Alcohol withdrawal with questionable seizure/DTs -Patient admits to having auditory hallucinations -Alcohol level 196 -Continue home dose of Ativan -Continue with Thiamine and folate.  -CIWA protocol.  Generalized Anxiety disorder -Patient self medicates herself with alcohol and has been doing this since loss of her father -Patient likely needs a behavioral health follow-up, psych consulted.   Sinus Tachycardia; -TSH recently checked 01/20/2017, 2.216 -related to alcohol withdrawal  -IV fluids.   Nausea -PRN Zofran/   Tobacco abuse -States she recently quit smoking.    DVT prophylaxis: Lovenox Code Status: full code.  Family Communication: care discussed with patient  Disposition Plan: admit to step down unit    Consultants:   none   Procedures:  none   Antimicrobials:    none   Subjective: She is alert, tremors hands, arm   Objective: Vitals:   03/05/17 0444 03/05/17 0445 03/05/17 0500 03/05/17 0644  BP: 126/89 126/89 (!) 124/94 123/87  Pulse: 96 97 88 84  Resp:  17 17 15   Temp:      TempSrc:      SpO2:  98% 96% 97%  Weight:      Height:       No intake or output data in the 24 hours ending 03/05/17 0929 Filed Weights   03/04/17 1731  Weight: 63.5 kg (140 lb)    Examination:  General exam: Appears calm and comfortable , tremors.  Respiratory system: Clear to auscultation. Respiratory effort normal. Cardiovascular system: S1 & S2 heard, RRR. No JVD, murmurs, rubs, gallops or clicks. No pedal edema. Gastrointestinal system: Abdomen is nondistended, soft and nontender. No organomegaly or masses felt. Normal bowel sounds heard. Central nervous system: Alert. No focal neurological deficits. Tremors.  Extremities: Symmetric 5 x 5 power. Skin: No rashes, lesions or ulcers     Data Reviewed: I have personally reviewed following labs and imaging studies  CBC:  Recent Labs Lab 03/04/17 1826 03/05/17 0500  WBC 8.1 9.2  NEUTROABS 4.9  --   HGB 15.5* 13.1  HCT 44.1 39.2  MCV 85.1 86.5  PLT 315 709   Basic Metabolic Panel:  Recent Labs Lab 03/04/17 1826 03/05/17 0500  NA 135 138  K 3.8 4.9  CL 100* 105  CO2 19* 23  GLUCOSE 113* 81  BUN 8 6  CREATININE 0.57 0.56  CALCIUM 9.0 7.7*   GFR: Estimated Creatinine Clearance: 95.4 mL/min (by C-G formula based on SCr of 0.56 mg/dL). Liver Function  Tests:  Recent Labs Lab 03/05/17 0500  AST 84*  ALT 44  ALKPHOS 73  BILITOT 1.3*  PROT 6.9  ALBUMIN 3.7   No results for input(s): LIPASE, AMYLASE in the last 168 hours. No results for input(s): AMMONIA in the last 168 hours. Coagulation Profile: No results for input(s): INR, PROTIME in the last 168 hours. Cardiac Enzymes: No results for input(s): CKTOTAL, CKMB, CKMBINDEX, TROPONINI in the last 168 hours. BNP (last 3  results) No results for input(s): PROBNP in the last 8760 hours. HbA1C: No results for input(s): HGBA1C in the last 72 hours. CBG:  Recent Labs Lab 03/04/17 1827  GLUCAP 130*   Lipid Profile: No results for input(s): CHOL, HDL, LDLCALC, TRIG, CHOLHDL, LDLDIRECT in the last 72 hours. Thyroid Function Tests: No results for input(s): TSH, T4TOTAL, FREET4, T3FREE, THYROIDAB in the last 72 hours. Anemia Panel: No results for input(s): VITAMINB12, FOLATE, FERRITIN, TIBC, IRON, RETICCTPCT in the last 72 hours. Sepsis Labs: No results for input(s): PROCALCITON, LATICACIDVEN in the last 168 hours.  No results found for this or any previous visit (from the past 240 hour(s)).       Radiology Studies: No results found.      Scheduled Meds: . enoxaparin (LOVENOX) injection  40 mg Subcutaneous Q24H  . folic acid  1 mg Oral Daily  . multivitamin with minerals  1 tablet Oral Daily  . multivitamin with minerals  1 tablet Oral Daily  . thiamine  100 mg Oral Daily   Continuous Infusions: . sodium chloride Stopped (03/04/17 2301)     LOS: 1 day    Time spent: 35 minutes.     Elmarie Shiley, MD [facility] Pager [phone]  If 7PM-7AM, please contact night-coverage [email or web address] Password [email or web address] 03/05/2017, 9:29 AM

## 2017-03-06 DIAGNOSIS — F17210 Nicotine dependence, cigarettes, uncomplicated: Secondary | ICD-10-CM

## 2017-03-06 MED ORDER — CLONAZEPAM 0.5 MG PO TABS
0.5000 mg | ORAL_TABLET | Freq: Two times a day (BID) | ORAL | Status: DC
  Administered 2017-03-06 – 2017-03-08 (×4): 0.5 mg via ORAL
  Filled 2017-03-06 (×4): qty 1

## 2017-03-06 MED ORDER — BUSPIRONE HCL 5 MG PO TABS
5.0000 mg | ORAL_TABLET | Freq: Two times a day (BID) | ORAL | Status: DC
  Administered 2017-03-06 – 2017-03-08 (×4): 5 mg via ORAL
  Filled 2017-03-06 (×4): qty 1

## 2017-03-06 NOTE — Progress Notes (Signed)
PROGRESS NOTE    Kristine Macias  IPJ:[id number] DOB: [birth year] DOA: 03/04/2017 PCP: Leonides Sake, MD   Brief Narrative: Kristine Macias is a 31 y.o. female with a medical history of alcoholism with withdrawal symptoms, depression and anxiety, who presented to the emergency department with complaints of seizure. Patient recently admitted on 02/16/2018 and discharge 02/22/2017 for similar episode. Patient states she drinks approximately 9 large cans of beer containing 8% alcohol, last use was 24 hours ago. Currently feels very shaky and wondering if she will be getting more than just Ativan. Patient also complains of nausea but denies any current vomiting. Currently denies any chest pain, shortness of breath, abdominal pain, vomiting, diarrhea or constipation, recent illness or travel.  ED Course: Found to have of alcohol level 129, with suppose it seizure per patient. Patient given Ativan. Tear H called for admission.   Assessment & Plan:   Principal Problem:   Generalized anxiety disorder Active Problems:   Tobacco use   Withdrawal seizures ([employer])   Alcohol withdrawal ([place])  Alcohol withdrawal with questionable seizure/DTs -Patient admits to having auditory hallucinations -Alcohol level 196 -Continue home dose of Ativan -Continue with Thiamine and folate.  -CIWA protoco, will transfer out of stepdown.  Generalized Anxiety disorder -Patient self medicates herself with alcohol and has been doing this since loss of her father -Patient likely needs a behavioral health follow-up, psych consulted.  Appreciate psychiatric consultation, started on BuSpar. Also started on Klonopin.  Sinus Tachycardia; -TSH recently checked 01/20/2017, 2.216 -related to alcohol withdrawal  -IV fluids stopped  Nausea -PRN Zofran/   Tobacco abuse -States she recently quit smoking.    DVT prophylaxis: Lovenox Code Status: full code.  Family Communication: care discussed with patient   Disposition Plan: Transfer to telemetry   Consultants:   none   Procedures:  none   Antimicrobials:   none   Subjective: Pt denies any fever, chills, headache, chest pain, palpitation, shortness of breath, orthopnea, PND, pedal edema, nausea, vomiting, diarrhea, constipation, abdominal pain, fall, trauma or injury, dizziness, difficulty swallowing, focal neurological deficit.   Objective: Vitals:   03/06/17 0336 03/06/17 0800 03/06/17 1200 03/06/17 1600  BP:  (!) 126/96 (!) 129/93 (!) 134/92  Pulse:  81 67 73  Resp:  12 19 15   Temp: 98.2 F (36.8 C) 98.2 F (36.8 C) 98.6 F (37 C) 99.2 F (37.3 C)  TempSrc: Oral Oral Oral Oral  SpO2:  97% 94% 99%  Weight:      Height:        Intake/Output Summary (Last 24 hours) at 03/06/17 1808 Last data filed at 03/06/17 0800  Gross per 24 hour  Intake             1440 ml  Output              900 ml  Net              540 ml   Filed Weights   03/04/17 1731 03/05/17 1516  Weight: 63.5 kg (140 lb) 59.9 kg (132 lb 0.9 oz)    Examination:  General exam: Appears calm and comfortable , tremors.  Respiratory system: Clear to auscultation. Respiratory effort normal. Cardiovascular system: S1 & S2 heard, RRR. No JVD, murmurs, rubs, gallops or clicks. No pedal edema. Gastrointestinal system: Abdomen is nondistended, soft and nontender. No organomegaly or masses felt. Normal bowel sounds heard. Central nervous system: Alert. No focal neurological deficits. Tremors.  Extremities: Symmetric 5 x 5  power. Skin: No rashes, lesions or ulcers     Data Reviewed: I have personally reviewed following labs and imaging studies  CBC:  Recent Labs Lab 03/04/17 1826 03/05/17 0500  WBC 8.1 9.2  NEUTROABS 4.9  --   HGB 15.5* 13.1  HCT 44.1 39.2  MCV 85.1 86.5  PLT 315 886   Basic Metabolic Panel:  Recent Labs Lab 03/04/17 1826 03/05/17 0500  NA 135 138  K 3.8 4.9  CL 100* 105  CO2 19* 23  GLUCOSE 113* 81  BUN 8 6    CREATININE 0.57 0.56  CALCIUM 9.0 7.7*   GFR: Estimated Creatinine Clearance: 95.4 mL/min (by C-G formula based on SCr of 0.56 mg/dL). Liver Function Tests:  Recent Labs Lab 03/05/17 0500  AST 84*  ALT 44  ALKPHOS 73  BILITOT 1.3*  PROT 6.9  ALBUMIN 3.7   No results for input(s): LIPASE, AMYLASE in the last 168 hours. No results for input(s): AMMONIA in the last 168 hours. Coagulation Profile: No results for input(s): INR, PROTIME in the last 168 hours. Cardiac Enzymes: No results for input(s): CKTOTAL, CKMB, CKMBINDEX, TROPONINI in the last 168 hours. BNP (last 3 results) No results for input(s): PROBNP in the last 8760 hours. HbA1C: No results for input(s): HGBA1C in the last 72 hours. CBG:  Recent Labs Lab 03/04/17 1827  GLUCAP 130*   Lipid Profile: No results for input(s): CHOL, HDL, LDLCALC, TRIG, CHOLHDL, LDLDIRECT in the last 72 hours. Thyroid Function Tests: No results for input(s): TSH, T4TOTAL, FREET4, T3FREE, THYROIDAB in the last 72 hours. Anemia Panel: No results for input(s): VITAMINB12, FOLATE, FERRITIN, TIBC, IRON, RETICCTPCT in the last 72 hours. Sepsis Labs: No results for input(s): PROCALCITON, LATICACIDVEN in the last 168 hours.  No results found for this or any previous visit (from the past 240 hour(s)).       Radiology Studies: No results found.      Scheduled Meds: . busPIRone  5 mg Oral BID  . clonazePAM  0.5 mg Oral BID  . enoxaparin (LOVENOX) injection  40 mg Subcutaneous Q24H  . folic acid  1 mg Oral Daily  . multivitamin with minerals  1 tablet Oral Daily  . thiamine  100 mg Oral Daily   Continuous Infusions:    LOS: 2 days    Time spent: 35 minutes.   Author:  Berle Mull, MD [facility] Pager: [phone] 03/06/2017 6:10 PM
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
## 2017-03-07 NOTE — Progress Notes (Signed)
PROGRESS NOTE    Kristine Macias  YTK:[id number] DOB: 30-May-1986 DOA: 03/04/2017 PCP: Leonides Sake, MD   Brief Narrative: Kristine Macias is a 31 y.o. female with a medical history of alcoholism with withdrawal symptoms, depression and anxiety, who presented to the emergency department with complaints of seizure. Patient recently admitted on 02/16/2018 and discharge 02/22/2017 for similar episode. Patient states she drinks approximately 9 large cans of beer containing 8% alcohol, last use was 24 hours prior to admission ED Course: Found to have of alcohol level 129, with supposed seizure per patient.   Assessment & Plan:  Alcohol withdrawal with questionable seizure/DTs -Patient admits to having auditory hallucinations -Alcohol level 196 on admission -Treated with CIWA protocol using Ativan and stepdown -Improving -Transfer to floor, ambulate -Appreciate psych consult, social worker consulted for outpatient resources  Generalized Anxiety disorder -Psychiatric consultation appreciated started on Klonopin and BuSpar by Dr. Lenna Sciara  Sinus Tachycardia; -TSH recently checked 01/20/2017, 2.216 -Due to above issues improved  Tobacco abuse -States she recently quit smoking.    DVT prophylaxis: Lovenox Code Status: full code.  Family Communication: care discussed with patient  Disposition Plan: Transfer to telemetry, home tomorrow if stable   Consultants:   none   Procedures:  none   Antimicrobials:   none   Subjective: Still feels a little shaky, overall improving, anxious about children  Objective: Vitals:   03/07/17 0800 03/07/17 1000 03/07/17 1025 03/07/17 1200  BP:   120/80 115/70  Pulse: (!) 57 68 71 61  Resp: (!) 27 16 18 17   Temp: 98.4 F (36.9 C)     TempSrc: Oral     SpO2: 97% 100% 97% 97%  Weight:      Height:        Intake/Output Summary (Last 24 hours) at 03/07/17 1324 Last data filed at 03/07/17 1200  Gross per 24 hour  Intake               240 ml  Output              800 ml  Net             -560 ml   Filed Weights   03/04/17 1731 03/05/17 1516  Weight: 63.5 kg (140 lb) 59.9 kg (132 lb 0.9 oz)    Examination:  Gen: Awake, Alert, Oriented X 3, no distress HEENT: PERRLA, Neck supple, no JVD Lungs: Good air movement bilaterally, CTAB CVS: RRR,No Gallops,Rubs or new Murmurs Abd: soft, Non tender, non distended, BS present Extremities: No Cyanosis, Clubbing or edema Skin: no new rashes Neuro: No asterixis, mild intentional tremors     Data Reviewed: I have personally reviewed following labs and imaging studies  CBC:  Recent Labs Lab 03/04/17 1826 03/05/17 0500  WBC 8.1 9.2  NEUTROABS 4.9  --   HGB 15.5* 13.1  HCT 44.1 39.2  MCV 85.1 86.5  PLT 315 557   Basic Metabolic Panel:  Recent Labs Lab 03/04/17 1826 03/05/17 0500  NA 135 138  K 3.8 4.9  CL 100* 105  CO2 19* 23  GLUCOSE 113* 81  BUN 8 6  CREATININE 0.57 0.56  CALCIUM 9.0 7.7*   GFR: Estimated Creatinine Clearance: 95.4 mL/min (by C-G formula based on SCr of 0.56 mg/dL). Liver Function Tests:  Recent Labs Lab 03/05/17 0500  AST 84*  ALT 44  ALKPHOS 73  BILITOT 1.3*  PROT 6.9  ALBUMIN 3.7   No results for input(s): LIPASE, AMYLASE  in the last 168 hours. No results for input(s): AMMONIA in the last 168 hours. Coagulation Profile: No results for input(s): INR, PROTIME in the last 168 hours. Cardiac Enzymes: No results for input(s): CKTOTAL, CKMB, CKMBINDEX, TROPONINI in the last 168 hours. BNP (last 3 results) No results for input(s): PROBNP in the last 8760 hours. HbA1C: No results for input(s): HGBA1C in the last 72 hours. CBG:  Recent Labs Lab 03/04/17 1827  GLUCAP 130*   Lipid Profile: No results for input(s): CHOL, HDL, LDLCALC, TRIG, CHOLHDL, LDLDIRECT in the last 72 hours. Thyroid Function Tests: No results for input(s): TSH, T4TOTAL, FREET4, T3FREE, THYROIDAB in the last 72 hours. Anemia Panel: No results  for input(s): VITAMINB12, FOLATE, FERRITIN, TIBC, IRON, RETICCTPCT in the last 72 hours. Sepsis Labs: No results for input(s): PROCALCITON, LATICACIDVEN in the last 168 hours.  No results found for this or any previous visit (from the past 240 hour(s)).       Radiology Studies: No results found.      Scheduled Meds: . busPIRone  5 mg Oral BID  . clonazePAM  0.5 mg Oral BID  . enoxaparin (LOVENOX) injection  40 mg Subcutaneous Q24H  . folic acid  1 mg Oral Daily  . multivitamin with minerals  1 tablet Oral Daily  . thiamine  100 mg Oral Daily   Continuous Infusions:    LOS: 3 days    Time spent: 35 minutes.   Author:  Domenic Polite MD [facility] Pager: [phone] 03/07/2017 1:24 PM

## 2017-03-07 NOTE — Clinical Social Work Note (Signed)
Clinical Social Work Assessment  Patient Details  Name: Kristine Macias MRN: [id number] Date of Birth: Jul 30, 1986  Date of referral:  03/07/17               Reason for consult:  Substance Use/ETOH Abuse, Discharge Planning, [employer]                Permission sought to share information with:  Psychiatrist Permission granted to share information::     Name::        Agency::     Relationship::     Contact Information:     Housing/Transportation Living arrangements for the past 2 months:  Single Family Home Source of Information:  Patient Patient Interpreter Needed:  None Criminal Activity/Legal Involvement Pertinent to Current Situation/Hospitalization:  No - Comment as needed Significant Relationships:  Mental Health Provider, Dependent Children, Significant Other Lives with:  Significant Other, Minor Children Do you feel safe going back to the place where you live?  Yes Need for family participation in patient care:  No (Coment)  Care giving concerns:  Seizure, alcoholism with withdrawal symptoms and anxiety. Patient denies feeling depressed.  Patient recently admitted for similar episode.  Patient was seen by CSW 6/25 to address substance abuse concerns and provided patient resources.     Social Worker assessment / plan:  CSW and psychairtist met with patient and fiance(sleeping) at bedside, explain role and reason for visit. Patient alert, oriented and agreeable to talk.  Patient reports she came to hospital after to she had a seizure at home. She reports she has been drinking alcohol and cosume prior to admission. She reports she continues to have anxiety and sees Dr. Julianne Rice with [facility] of [place]. She reports the physician does house visits due to her social anxiety. Patient reports she is not taking medications for her anxiety but was prescribed medications in the past that she felt did not work so stop taking them. Patient name several medications she  reports cause allergic reactions.  Patient very resistant to try new medications for her anxiety. Patient worried about medication not working and giving more anxiety.  Patient reports she has been to behavioral health outpatient clinic for evaluation and was going to [employer] health intensive outpatient and participated in group and individual therapy. Patient reports she felt it helped for awhile then stopped going.  Patient reports she is open to resources for intensive outpatient.    Plan: Follow up with outpatient resources.   Employment status:  Unemployed Forensic scientist:  Medicaid In [place] PT Recommendations:  Not assessed at this time Information / Referral to community resources:  Outpatient Substance Abuse Treatment Options  Patient/Family's Response to care:  Appreciative of CSW services.   Patient/Family's Understanding of and Emotional Response to Diagnosis, Current Treatment, and Prognosis:  Patient Fiance did not participate during evaluation.  Patient not ready to commit to residential treatment/agreeable to outpatient SA treatment.   Emotional Assessment Appearance:  Developmentally appropriate Attitude/Demeanor/Rapport:    Affect (typically observed):  Anxious Orientation:  Oriented to Self, Oriented to Place, Oriented to  Time, Oriented to Situation Alcohol / Substance use:  Not Applicable Psych involvement (Current and /or in the community):  Yes (Comment)  Discharge Needs  Concerns to be addressed:  Mental Health Concerns, Substance Abuse Concerns Readmission within the last 30 days:  Yes Current discharge risk:  Substance Abuse Barriers to Discharge:  No Barriers Identified   Lia Hopping, LCSW 03/07/2017, 9:00 AM

## 2017-03-07 NOTE — Care Management Note (Signed)
Case Management Note  Patient Details  Name: Kristine Macias MRN: [id number] Date of Birth: 1986-08-27  Subjective/Objective:                  ETOH  Abuse and withdrawal  Action/Plan: Date:  March 07, 2017  Chart reviewed for concurrent status and case management needs.  Will continue to follow patient progress.  Discharge Planning: following for needs  Expected discharge date: [id number]  Velva Harman, BSN, [place], [place]   Expected Discharge Date:   (unknown)               Expected Discharge Plan:     In-House Referral:     Discharge planning Services     Post Acute Care Choice:    Choice offered to:     DME Arranged:    DME Agency:     HH Arranged:    [place] Agency:     Status of Service:     If discussed at [employer] of Stay Meetings, dates discussed:    Additional Comments:  Leeroy Cha, RN 03/07/2017, 9:22 AM

## 2017-03-07 NOTE — Progress Notes (Signed)
Patient transferring to room 1434.  Report called to [place], Therapist, sports.  Patient to travel by wheelchair.  Will continue to monitor.

## 2017-03-08 MED ORDER — CLONAZEPAM 0.5 MG PO TABS: 0.5000 mg | ORAL_TABLET | Freq: Two times a day (BID) | ORAL | 0 refills | Status: DC

## 2017-03-08 MED ORDER — BUSPIRONE HCL 5 MG PO TABS: 5.0000 mg | ORAL_TABLET | Freq: Two times a day (BID) | ORAL | 0 refills | Status: DC

## 2017-03-08 NOTE — Discharge Summary (Signed)
Physician Discharge Summary  Kristine Macias:[id number] DOB: 01-Aug-1986 DOA: 03/04/2017  PCP: Leonides Sake, Macias  Admit date: 03/04/2017 Discharge date: 03/08/2017  Time spent: 35 minutes  Recommendations for Outpatient Follow-up:  1. PCP Dr.Hamrick in 1 week 2. Psych [facility] [place] in 1 week   Discharge Diagnoses:    ETOH abuse   ETOH withdrawal/DTs   Generalized anxiety disorder   Tobacco use   Withdrawal seizures (Kristine Macias)   Depression   Discharge Condition: stable  Diet recommendation:regular  Filed Weights   03/04/17 1731 03/05/17 1516  Weight: 63.5 kg (140 lb) 59.9 kg (132 lb 0.9 oz)    History of present illness:  Kristine Macias Johnstonis a 31 y.o.femalewith a medical history of alcoholism withwithdrawal symptoms, depression and anxiety, who presented to the emergency department with complaints of seizure. Patient recently admitted on 02/16/2018 and discharge 02/22/2017 for similar episode. Patient states she drinks approximately 9 large cans of beer containing 8% alcohol, last use was 24 hours prior to admission ED Course: Found to have of alcohol level 129, with supposed seizure per patient.   Hospital Course:   Alcohol withdrawal with questionable seizure/DTs -Alcohol level 196 on admission with reported h/o seizure, not witnessed by anyone prior to admission -Treated with CIWA protocol using Ativan and stepdown -Improved and stabilized -psychiatry consulted, started on Klonopin and Buspar by Kristine Macias, psych Macias -discharged home with outpatient psych resources, to FU at [place] disorder -Psychiatric consultation appreciated started on Klonopin and BuSpar by Dr. Octavio Macias  Sinus Tachycardia; -TSH recently checked 01/20/2017, 2.216 -Due to above issues improved  Tobacco abuse -States she recently quit smoking.   Consultations: Psychiatry Discharge Exam: Vitals:   03/08/17 0800  03/08/17 0915  BP: 115/82 116/79  Pulse: 88 82  Resp:    Temp:      General: AAOx3 Cardiovascular: S1S2/RRR Respiratory: CTAB  Discharge Instructions   Discharge Instructions    Diet general    Complete by:  As directed    Increase activity slowly    Complete by:  As directed      Discharge Medication List as of 03/08/2017 12:23 PM    START taking these medications   Details  busPIRone (BUSPAR) 5 MG tablet Take 1 tablet (5 mg total) by mouth 2 (two) times daily., Starting Fri 03/08/2017, Print    clonazePAM (KLONOPIN) 0.5 MG tablet Take 1 tablet (0.5 mg total) by mouth 2 (two) times daily., Starting Fri 03/08/2017, Print      CONTINUE these medications which have NOT CHANGED   Details  Melatonin 1 MG CAPS Take 1 mg by mouth at bedtime. sleep , Historical Med    ondansetron (ZOFRAN ODT) 4 MG disintegrating tablet Take 1 tablet (4 mg total) by mouth every 8 (eight) hours as needed for nausea or vomiting., Starting Fri 02/22/2017, Print       Allergies  Allergen Reactions  . Aspirin Shortness Of Breath  . Effexor [Venlafaxine] Anaphylaxis  . Gabapentin Anaphylaxis  . Libritabs [Chlordiazepoxide] Anaphylaxis    Tolerates lorazepam and clonazepam  . Sulfa Antibiotics Anaphylaxis  . Tetracyclines & Related Anaphylaxis  . Trazodone And Nefazodone Anaphylaxis  . Latex Hives and Itching  . Paxil [Paroxetine] Hives and Itching  . Seroquel [Quetiapine Fumarate] Hives and Itching  . Zoloft [Sertraline Hcl] Hives and Itching   Follow-up Information    Kristine Macias. Schedule an appointment as soon as possible for a visit in  1 week(s).   Specialty:  Family Medicine Contact information: [place] [address] [place] Schedule an appointment as soon as possible for a visit in 1 week(s).   Specialty:  Catering manager information: [facility] [place] [id number] [phone]            The results of significant diagnostics from this hospitalization (including imaging, microbiology, ancillary and laboratory) are listed below for reference.    Significant Diagnostic Studies: No results found.  Microbiology: No results found for this or any previous visit (from the past 240 hour(s)).   Labs: Basic Metabolic Panel:  Recent Labs Lab 03/04/17 1826 03/05/17 0500  NA 135 138  K 3.8 4.9  CL 100* 105  CO2 19* 23  GLUCOSE 113* 81  BUN 8 6  CREATININE 0.57 0.56  CALCIUM 9.0 7.7*   Liver Function Tests:  Recent Labs Lab 03/05/17 0500  AST 84*  ALT 44  ALKPHOS 73  BILITOT 1.3*  PROT 6.9  ALBUMIN 3.7   No results for input(s): LIPASE, AMYLASE in the last 168 hours. No results for input(s): AMMONIA in the last 168 hours. CBC:  Recent Labs Lab 03/04/17 1826 03/05/17 0500  WBC 8.1 9.2  NEUTROABS 4.9  --   HGB 15.5* 13.1  HCT 44.1 39.2  MCV 85.1 86.5  PLT 315 263   Cardiac Enzymes: No results for input(s): CKTOTAL, CKMB, CKMBINDEX, TROPONINI in the last 168 hours. BNP: BNP (last 3 results) No results for input(s): BNP in the last 8760 hours.  ProBNP (last 3 results) No results for input(s): PROBNP in the last 8760 hours.  CBG:  Recent Labs Lab 03/04/17 1827  GLUCAP 130*       SignedDomenic Polite Macias.  Kristine Macias 03/08/2017, 2:09 PM

## 2017-03-08 NOTE — Progress Notes (Signed)
CSW followed up with patient at bedside and discussed SA treatment/ Psychiatrist follow up. Patient is agreeable to continue with [facility] where she is already enrolled for therapy. She plans to make appointment w/ psychiatrist for medication management and start substance abuse groups. The patient is hopeful about the program.  CSW identities no other needs at this time.  CSW signing off.   Kathrin Greathouse, Latanya Presser, MSW Clinical Social Worker 5E and Psychiatric Service Line [phone] 03/08/2017  10:33 AM

## 2017-03-27 DIAGNOSIS — Z889 Allergy status to unspecified drugs, medicaments and biological substances status: Secondary | ICD-10-CM | POA: Insufficient documentation

## 2018-02-07 ENCOUNTER — Encounter: Payer: Self-pay | Admitting: *Deleted

## 2018-02-07 ENCOUNTER — Other Ambulatory Visit: Payer: Self-pay

## 2018-02-07 DIAGNOSIS — E872 Acidosis: Secondary | ICD-10-CM | POA: Diagnosis present

## 2018-02-07 DIAGNOSIS — E871 Hypo-osmolality and hyponatremia: Secondary | ICD-10-CM | POA: Diagnosis present

## 2018-02-07 DIAGNOSIS — J440 Chronic obstructive pulmonary disease with acute lower respiratory infection: Secondary | ICD-10-CM | POA: Diagnosis present

## 2018-02-07 DIAGNOSIS — J441 Chronic obstructive pulmonary disease with (acute) exacerbation: Secondary | ICD-10-CM | POA: Diagnosis not present

## 2018-02-07 DIAGNOSIS — J918 Pleural effusion in other conditions classified elsewhere: Secondary | ICD-10-CM | POA: Diagnosis present

## 2018-02-07 DIAGNOSIS — Y95 Nosocomial condition: Secondary | ICD-10-CM | POA: Diagnosis present

## 2018-02-07 DIAGNOSIS — K7010 Alcoholic hepatitis without ascites: Secondary | ICD-10-CM | POA: Diagnosis present

## 2018-02-07 DIAGNOSIS — N10 Acute tubulo-interstitial nephritis: Secondary | ICD-10-CM | POA: Diagnosis present

## 2018-02-07 DIAGNOSIS — N390 Urinary tract infection, site not specified: Secondary | ICD-10-CM | POA: Diagnosis present

## 2018-02-07 DIAGNOSIS — Z91040 Latex allergy status: Secondary | ICD-10-CM

## 2018-02-07 DIAGNOSIS — H9042 Sensorineural hearing loss, unilateral, left ear, with unrestricted hearing on the contralateral side: Secondary | ICD-10-CM | POA: Diagnosis present

## 2018-02-07 DIAGNOSIS — J188 Other pneumonia, unspecified organism: Secondary | ICD-10-CM | POA: Diagnosis present

## 2018-02-07 DIAGNOSIS — J9601 Acute respiratory failure with hypoxia: Secondary | ICD-10-CM | POA: Diagnosis present

## 2018-02-07 DIAGNOSIS — E878 Other disorders of electrolyte and fluid balance, not elsewhere classified: Secondary | ICD-10-CM | POA: Diagnosis not present

## 2018-02-07 DIAGNOSIS — F1010 Alcohol abuse, uncomplicated: Secondary | ICD-10-CM | POA: Diagnosis present

## 2018-02-07 DIAGNOSIS — Z882 Allergy status to sulfonamides status: Secondary | ICD-10-CM

## 2018-02-07 DIAGNOSIS — Z79899 Other long term (current) drug therapy: Secondary | ICD-10-CM

## 2018-02-07 DIAGNOSIS — Z886 Allergy status to analgesic agent status: Secondary | ICD-10-CM

## 2018-02-07 DIAGNOSIS — E876 Hypokalemia: Secondary | ICD-10-CM | POA: Diagnosis present

## 2018-02-07 DIAGNOSIS — Z881 Allergy status to other antibiotic agents status: Secondary | ICD-10-CM

## 2018-02-07 DIAGNOSIS — F17210 Nicotine dependence, cigarettes, uncomplicated: Secondary | ICD-10-CM | POA: Diagnosis present

## 2018-02-07 DIAGNOSIS — A4151 Sepsis due to Escherichia coli [E. coli]: Principal | ICD-10-CM | POA: Diagnosis present

## 2018-02-07 DIAGNOSIS — B9620 Unspecified Escherichia coli [E. coli] as the cause of diseases classified elsewhere: Secondary | ICD-10-CM | POA: Diagnosis present

## 2018-02-07 DIAGNOSIS — Z853 Personal history of malignant neoplasm of breast: Secondary | ICD-10-CM

## 2018-02-07 DIAGNOSIS — Z888 Allergy status to other drugs, medicaments and biological substances status: Secondary | ICD-10-CM

## 2018-02-07 NOTE — ED Triage Notes (Signed)
3 days of right flank pain, radiates around to the right abdomen area. N/v/d, decreased appetite, decreased urination,  and hot/cold chills. Right arm and right leg tinging for 4-5 hours. Hx of pancreatitis and portal vein thrombosis- states feels similar to these.

## 2018-02-08 ENCOUNTER — Emergency Department: Payer: Self-pay

## 2018-02-08 ENCOUNTER — Inpatient Hospital Stay
Admission: EM | Admit: 2018-02-08 | Discharge: 2018-02-13 | DRG: 871 | Disposition: A | Discharge status: Home / Self Care | Payer: Self-pay | Attending: Internal Medicine | Admitting: Internal Medicine

## 2018-02-08 DIAGNOSIS — N12 Tubulo-interstitial nephritis, not specified as acute or chronic: Secondary | ICD-10-CM | POA: Diagnosis present

## 2018-02-08 DIAGNOSIS — E871 Hypo-osmolality and hyponatremia: Secondary | ICD-10-CM

## 2018-02-08 DIAGNOSIS — R109 Unspecified abdominal pain: Secondary | ICD-10-CM

## 2018-02-08 DIAGNOSIS — J449 Chronic obstructive pulmonary disease, unspecified: Secondary | ICD-10-CM

## 2018-02-08 DIAGNOSIS — A419 Sepsis, unspecified organism: Secondary | ICD-10-CM

## 2018-02-08 DIAGNOSIS — E876 Hypokalemia: Secondary | ICD-10-CM

## 2018-02-08 DIAGNOSIS — N390 Urinary tract infection, site not specified: Secondary | ICD-10-CM

## 2018-02-08 LAB — URINALYSIS, COMPLETE (UACMP) WITH MICROSCOPIC
Bacteria, UA: NONE SEEN
Bilirubin Urine: NEGATIVE
GLUCOSE, UA: NEGATIVE mg/dL
Ketones, ur: 5 mg/dL — AB
NITRITE: POSITIVE — AB
PH: 6 (ref 5.0–8.0)
Protein, ur: 100 mg/dL — AB
SPECIFIC GRAVITY, URINE: 1.011 (ref 1.005–1.030)
WBC, UA: >50 WBC/hpf — ABNORMAL HIGH (ref 0–5)

## 2018-02-08 LAB — COMPREHENSIVE METABOLIC PANEL
ALT: 79 U/L — AB (ref 14–54)
AST: 110 U/L — AB (ref 15–41)
Albumin: 3.9 g/dL (ref 3.5–5.0)
Alkaline Phosphatase: 102 U/L (ref 38–126)
Anion gap: 18 — ABNORMAL HIGH (ref 5–15)
BILIRUBIN TOTAL: 0.7 mg/dL (ref 0.3–1.2)
BUN: 7 mg/dL (ref 6–20)
CO2: 22 mmol/L (ref 22–32)
Calcium: 9.1 mg/dL (ref 8.9–10.3)
Chloride: 86 mmol/L — ABNORMAL LOW (ref 101–111)
Creatinine, Ser: 0.53 mg/dL (ref 0.44–1.00)
Glucose, Bld: 124 mg/dL — ABNORMAL HIGH (ref 65–99)
Potassium: 2.5 mmol/L — CL (ref 3.5–5.1)
Sodium: 126 mmol/L — ABNORMAL LOW (ref 135–145)
TOTAL PROTEIN: 8.2 g/dL — AB (ref 6.5–8.1)

## 2018-02-08 LAB — BLOOD CULTURE ID PANEL (REFLEXED)
ACINETOBACTER BAUMANNII: NOT DETECTED
CANDIDA ALBICANS: NOT DETECTED
Candida glabrata: NOT DETECTED
Candida krusei: NOT DETECTED
Candida parapsilosis: NOT DETECTED
Candida tropicalis: NOT DETECTED
Carbapenem resistance: NOT DETECTED
ENTEROBACTER CLOACAE COMPLEX: NOT DETECTED
ENTEROBACTERIACEAE SPECIES: DETECTED — AB
ENTEROCOCCUS SPECIES: NOT DETECTED
Escherichia coli: DETECTED — AB
HAEMOPHILUS INFLUENZAE: NOT DETECTED
Klebsiella oxytoca: NOT DETECTED
Klebsiella pneumoniae: NOT DETECTED
LISTERIA MONOCYTOGENES: NOT DETECTED
NEISSERIA MENINGITIDIS: NOT DETECTED
PSEUDOMONAS AERUGINOSA: NOT DETECTED
Proteus species: NOT DETECTED
STREPTOCOCCUS AGALACTIAE: NOT DETECTED
STREPTOCOCCUS PYOGENES: NOT DETECTED
STREPTOCOCCUS SPECIES: NOT DETECTED
Serratia marcescens: NOT DETECTED
Staphylococcus aureus (BCID): NOT DETECTED
Staphylococcus species: NOT DETECTED
Streptococcus pneumoniae: NOT DETECTED

## 2018-02-08 LAB — CBC
HCT: 45.3 % (ref 35.0–47.0)
Hemoglobin: 15.4 g/dL (ref 12.0–16.0)
MCH: 31.3 pg (ref 26.0–34.0)
MCHC: 34 g/dL (ref 32.0–36.0)
MCV: 92 fL (ref 80.0–100.0)
PLATELETS: 121 10*3/uL — AB (ref 150–440)
RBC: 4.92 MIL/uL (ref 3.80–5.20)
RDW: 16.5 % — ABNORMAL HIGH (ref 11.5–14.5)
WBC: 15.2 10*3/uL — AB (ref 3.6–11.0)

## 2018-02-08 LAB — PREGNANCY, URINE: PREG TEST UR: NEGATIVE

## 2018-02-08 LAB — LIPASE, BLOOD: Lipase: 22 U/L (ref 11–51)

## 2018-02-08 LAB — LACTIC ACID, PLASMA
Lactic Acid, Venous: 2.5 mmol/L (ref 0.5–1.9)
Lactic Acid, Venous: 1.4 mmol/L (ref 0.5–1.9)

## 2018-02-08 LAB — MRSA PCR SCREENING: MRSA by PCR: NEGATIVE

## 2018-02-08 LAB — TSH: TSH: 0.758 u[IU]/mL (ref 0.350–4.500)

## 2018-02-08 MED ORDER — CEFTRIAXONE SODIUM 1 G IJ SOLR
INTRAMUSCULAR | Status: AC
  Filled 2018-02-08: qty 10

## 2018-02-08 MED ORDER — ONDANSETRON HCL 4 MG/2ML IJ SOLN
4.0000 mg | Freq: Four times a day (QID) | INTRAMUSCULAR | Status: DC | PRN
  Administered 2018-02-09: 4 mg via INTRAVENOUS
  Filled 2018-02-08: qty 2

## 2018-02-08 MED ORDER — SODIUM CHLORIDE 0.9 % IV SOLN
1.0000 g | Freq: Once | INTRAVENOUS | Status: AC
  Administered 2018-02-08: 1 g via INTRAVENOUS
  Filled 2018-02-08: qty 10

## 2018-02-08 MED ORDER — POTASSIUM CHLORIDE 10 MEQ/100ML IV SOLN
10.0000 meq | Freq: Once | INTRAVENOUS | Status: AC
  Administered 2018-02-08: 10 meq via INTRAVENOUS
  Filled 2018-02-08: qty 100

## 2018-02-08 MED ORDER — MORPHINE SULFATE (PF) 4 MG/ML IV SOLN
INTRAVENOUS | Status: AC
  Filled 2018-02-08: qty 1

## 2018-02-08 MED ORDER — ONDANSETRON HCL 4 MG/2ML IJ SOLN
INTRAMUSCULAR | Status: AC
  Filled 2018-02-08: qty 2

## 2018-02-08 MED ORDER — CEFTRIAXONE SODIUM 1 G IJ SOLR
1.0000 g | INTRAMUSCULAR | Status: DC
  Administered 2018-02-08: 1 g via INTRAVENOUS

## 2018-02-08 MED ORDER — MORPHINE SULFATE (PF) 2 MG/ML IV SOLN
2.0000 mg | INTRAVENOUS | Status: DC | PRN
  Administered 2018-02-08 (×2): 2 mg via INTRAVENOUS
  Administered 2018-02-08: 4 mg via INTRAVENOUS
  Administered 2018-02-09 – 2018-02-13 (×14): 2 mg via INTRAVENOUS
  Filled 2018-02-08 (×2): qty 1
  Filled 2018-02-08: qty 2
  Filled 2018-02-08 (×13): qty 1

## 2018-02-08 MED ORDER — MELATONIN 5 MG PO TABS
5.0000 mg | ORAL_TABLET | Freq: Every evening | ORAL | Status: DC | PRN
  Administered 2018-02-10 – 2018-02-12 (×2): 5 mg via ORAL
  Filled 2018-02-08 (×3): qty 1

## 2018-02-08 MED ORDER — SODIUM CHLORIDE 0.9 % IV BOLUS (SEPSIS)
500.0000 mL | Freq: Once | INTRAVENOUS | Status: AC
  Administered 2018-02-08: 500 mL via INTRAVENOUS

## 2018-02-08 MED ORDER — ONDANSETRON HCL 4 MG/2ML IJ SOLN
4.0000 mg | Freq: Once | INTRAMUSCULAR | Status: AC
  Administered 2018-02-08: 4 mg via INTRAVENOUS

## 2018-02-08 MED ORDER — POTASSIUM CHLORIDE CRYS ER 20 MEQ PO TBCR
40.0000 meq | EXTENDED_RELEASE_TABLET | Freq: Once | ORAL | Status: AC
  Administered 2018-02-08: 40 meq via ORAL
  Filled 2018-02-08: qty 2

## 2018-02-08 MED ORDER — SODIUM CHLORIDE 0.9 % IV BOLUS (SEPSIS): 1000.0000 mL | Freq: Once | INTRAVENOUS | Status: DC

## 2018-02-08 MED ORDER — MEROPENEM 1 G IV SOLR
1.0000 g | Freq: Three times a day (TID) | INTRAVENOUS | Status: DC
  Administered 2018-02-08 – 2018-02-10 (×6): 1 g via INTRAVENOUS
  Filled 2018-02-08 (×8): qty 1

## 2018-02-08 MED ORDER — ACETAMINOPHEN 650 MG RE SUPP: 650.0000 mg | Freq: Four times a day (QID) | RECTAL | Status: DC | PRN

## 2018-02-08 MED ORDER — FENTANYL CITRATE (PF) 100 MCG/2ML IJ SOLN
50.0000 ug | Freq: Once | INTRAMUSCULAR | Status: AC
  Administered 2018-02-08: 50 ug via INTRAVENOUS
  Filled 2018-02-08: qty 2

## 2018-02-08 MED ORDER — SODIUM CHLORIDE 0.9 % IV BOLUS (SEPSIS)
250.0000 mL | Freq: Once | INTRAVENOUS | Status: AC
  Administered 2018-02-08: 250 mL via INTRAVENOUS

## 2018-02-08 MED ORDER — DOCUSATE SODIUM 100 MG PO CAPS
100.0000 mg | ORAL_CAPSULE | Freq: Two times a day (BID) | ORAL | Status: DC
  Administered 2018-02-09: 100 mg via ORAL
  Filled 2018-02-08 (×10): qty 1

## 2018-02-08 MED ORDER — ONDANSETRON HCL 4 MG PO TABS
4.0000 mg | ORAL_TABLET | Freq: Four times a day (QID) | ORAL | Status: DC | PRN
Start: 2018-02-08 — End: 2018-02-13

## 2018-02-08 MED ORDER — SODIUM CHLORIDE 0.9 % IV SOLN
INTRAVENOUS | Status: DC
  Administered 2018-02-08 – 2018-02-09 (×4): via INTRAVENOUS

## 2018-02-08 MED ORDER — SODIUM CHLORIDE 0.9 % IV BOLUS
1000.0000 mL | Freq: Once | INTRAVENOUS | Status: AC
  Administered 2018-02-08: 1000 mL via INTRAVENOUS

## 2018-02-08 MED ORDER — ENOXAPARIN SODIUM 40 MG/0.4ML ~~LOC~~ SOLN
40.0000 mg | SUBCUTANEOUS | Status: DC
  Administered 2018-02-08 – 2018-02-12 (×5): 40 mg via SUBCUTANEOUS
  Filled 2018-02-08 (×5): qty 0.4

## 2018-02-08 MED ORDER — ACETAMINOPHEN 325 MG PO TABS
650.0000 mg | ORAL_TABLET | Freq: Four times a day (QID) | ORAL | Status: DC | PRN
  Administered 2018-02-08 – 2018-02-11 (×6): 650 mg via ORAL
  Filled 2018-02-08 (×6): qty 2

## 2018-02-08 MED ORDER — SODIUM CHLORIDE 0.9 % IV SOLN: 2.0000 g | INTRAVENOUS | Status: DC
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
## 2018-02-08 NOTE — Progress Notes (Signed)
PHARMACY - PHYSICIAN COMMUNICATION CRITICAL VALUE ALERT - BLOOD CULTURE IDENTIFICATION (BCID)  No results found for this or any previous visit.  12:58 lab calls to report 2 of 4 bottles (anaerobic bottles of different sets) showing GNR enterobacteriaceae E. Coli KPC not detected. Patient is on ceftriaxone now for UTI/pyelonephritis/sepsis.  Name of physician (or Provider) Contacted: Dr. Jerelyn Charles x 1 13:00  Changes to prescribed antibiotics required: Broaden to meropenem d/t antibiogram with >10% ESBL producers. Dr. Jerelyn Charles okay with Merrem.  Laural Benes, Pharm.D., BCPS Clinical Pharmacist 02/08/2018  1:00 PM

## 2018-02-08 NOTE — H&P (Signed)
Kristine Macias is an 32 y.o. female.   Chief Complaint: Flank pain HPI: The patient with past medical history of alcohol abuse and thrombocytopenia presents to the emergency department complaining of abdominal pain.  The pain is located over her right flank and has been colicky in nature.  She has had pain and intermittent fevers for the last 3 days.  The patient denies nausea or vomiting.  CT of her abdomen was negative for kidney stones but did demonstrate hepatic steatosis and increased renal signal with perinephric edema consistent with pyelonephritis.  The patient also met criteria for sepsis which prompted the emergency department staff to initiate septic protocol prior to calling the hospitalist service for admission.  Past Medical History:  Diagnosis Date  . Alcohol abuse 11/2014   drinking since age 27. chronic, recurrent. at least 2 detox admits before 2015.   Marland Kitchen Alcoholic hepatitis 09/4095   hepatic steatosis on 11/2014 ultrasound.   . Breast cancer [facility])    age 5, lump removed from left breast  . Chlamydia 2007   bacterial vaginosis 09/2011  . Colitis 12/2014   colonoscopy for diarrhea and abnml CT 01/06/15: erythmatous TI (path: active ileitis, ? emerging IBD), rectal erythema (path: mucosal prolapse). Random bx of normal colon (path unremarkable)   . Congenital deafness    left ear only.  Also noted in mother and sibling (unilateral).   . Depression with anxiety initally at age 32  . Failure to thrive in adult 12/2014.    Malnutrition: n/v, not eating, weight loss, BMI 14.  s/p 01/11/2015 PEG (Dr Dorna Leitz).   . Hypertensive urgency   . Irregular heart beat 2010   wt/diet related after evaluation  . Pancreas divisum 02/2015   Type 1 seen on CT.   Marland Kitchen Pneumothorax, spontaneous, tension   . Renal disorder   . Seizure ([place]) 06/2015   due to ETOH/benzo withdrawal.   . Spontaneous pneumothorax 08/2012   right.  chest tube placed.   . Thrombocytopenia ([place]) 04/2007    Past Surgical  History:  Procedure Laterality Date  . CESAREAN SECTION  07/2009    x 1.  G3, Para 2012.   . CHEST TUBE INSERTION    . COLONOSCOPY WITH PROPOFOL N/A 01/06/2015   [facility], Dr Rayann Heman. colonoscopy for diarrhea and abnml CT 01/06/15: erythmatous TI (path: active ileitis, ? emerging IBD), rectal erythema (path: mucosal prolapse). Random bx of normal colon (path unremarkable)   . ESOPHAGOGASTRODUODENOSCOPY N/A 01/06/2015   ;[facility], Dr Rayann Heman. For wt loss, N/V: Normal study, duodenal biopsy/pathology: chronic active duodenitis.   Marland Kitchen ESOPHAGOGASTRODUODENOSCOPY N/A 01/11/2015   Rein-normal with PEG placement  . PEG PLACEMENT N/A 01/11/2015   [facility], Dr Rayann Heman.  for N/V/wt loss/severe malnutrition.     Family History  Problem Relation Age of Onset  . Thyroid disease Mother   . Cancer Mother        breast cancer  . Cancer Father        lung cancer  . Stroke Other   . Crohn's disease Brother   . Cancer Maternal Grandmother        breast cancer  . Anesthesia problems Neg Hx    Social History:  reports that she has been smoking cigarettes.  She has a 13.00 pack-year smoking history. She has never used smokeless tobacco. She reports that she drinks about 21.6 oz of alcohol per week. She reports that she does not use drugs.  Allergies:  Allergies  Allergen Reactions  . Aspirin  Shortness Of Breath  . Effexor [Venlafaxine] Anaphylaxis  . Gabapentin Anaphylaxis  . Libritabs [Chlordiazepoxide] Anaphylaxis    Tolerates lorazepam and clonazepam  . Sulfa Antibiotics Anaphylaxis  . Tetracyclines & Related Anaphylaxis  . Trazodone And Nefazodone Anaphylaxis  . Latex Hives and Itching  . Paxil [Paroxetine] Hives and Itching  . Seroquel [Quetiapine Fumarate] Hives and Itching  . Zoloft [Sertraline Hcl] Hives and Itching    Medications Prior to Admission  Medication Sig Dispense Refill  . Melatonin 5 MG TABS Take 5 mg by mouth at bedtime as needed (sleep).      Results for orders placed or performed during the  hospital encounter of 02/08/18 (from the past 48 hour(s))  Lipase, blood     Status: None   Collection Time: 02/08/18 12:02 AM  Result Value Ref Range   Lipase 22 11 - 51 U/L    Comment: Performed at [facility], [address] [place], [place] [address]  Comprehensive metabolic panel     Status: Abnormal   Collection Time: 02/08/18 12:02 AM  Result Value Ref Range   Sodium 126 (L) 135 - 145 mmol/L   Potassium 2.5 (LL) 3.5 - 5.1 mmol/L    Comment: CRITICAL RESULT CALLED TO, READ BACK BY AND VERIFIED WITH BUTCH WOODS AT 1583 02/08/18.PMH   Chloride 86 (L) 101 - 111 mmol/L   CO2 22 22 - 32 mmol/L   Glucose, Bld 124 (H) 65 - 99 mg/dL   BUN 7 6 - 20 mg/dL   Creatinine, Ser 0.53 0.44 - 1.00 mg/dL   Calcium 9.1 8.9 - 10.3 mg/dL   Total Protein 8.2 (H) 6.5 - 8.1 g/dL   Albumin 3.9 3.5 - 5.0 g/dL   AST 110 (H) 15 - 41 U/L   ALT 79 (H) 14 - 54 U/L   Alkaline Phosphatase 102 38 - 126 U/L   Total Bilirubin 0.7 0.3 - 1.2 mg/dL   GFR calc non Af Amer >60 >60 mL/min   GFR calc Af Amer >60 >60 mL/min    Comment: (NOTE) The eGFR has been calculated using the CKD EPI equation. This calculation has not been validated in all clinical situations. eGFR's persistently <60 mL/min signify possible Chronic Kidney Disease.    Anion gap 18 (H) 5 - 15    Comment: Performed at [facility], [address] [place], [place] [address]  CBC     Status: Abnormal   Collection Time: 02/08/18 12:02 AM  Result Value Ref Range   WBC 15.2 (H) 3.6 - 11.0 K/uL   RBC 4.92 3.80 - 5.20 MIL/uL   Hemoglobin 15.4 12.0 - 16.0 g/dL   HCT 45.3 35.0 - 47.0 %   MCV 92.0 80.0 - 100.0 fL   MCH 31.3 26.0 - 34.0 pg   MCHC 34.0 32.0 - 36.0 g/dL   RDW 16.5 (H) 11.5 - 14.5 %   Platelets 121 (L) 150 - 440 K/uL    Comment: Performed at [facility], [address] [place], [place] [address]  Urinalysis, Complete w Microscopic     Status: Abnormal   Collection Time: 02/08/18 12:02 AM  Result Value  Ref Range   Color, Urine AMBER (A) YELLOW    Comment: BIOCHEMICALS MAY BE AFFECTED BY COLOR   APPearance CLOUDY (A) CLEAR   Specific Gravity, Urine 1.011 1.005 - 1.030   pH 6.0 5.0 - 8.0   Glucose, UA NEGATIVE NEGATIVE mg/dL   Hgb urine dipstick MODERATE (A) NEGATIVE  Bilirubin Urine NEGATIVE NEGATIVE   Ketones, ur 5 (A) NEGATIVE mg/dL   Protein, ur 100 (A) NEGATIVE mg/dL   Nitrite POSITIVE (A) NEGATIVE   Leukocytes, UA LARGE (A) NEGATIVE   RBC / HPF 6-10 0 - 5 RBC/hpf   WBC, UA >50 (H) 0 - 5 WBC/hpf   Bacteria, UA NONE SEEN NONE SEEN   Squamous Epithelial / LPF 6-10 0 - 5   WBC Clumps PRESENT    Mucus PRESENT     Comment: Performed at [facility], [address], [place], [place] [address]  Pregnancy, urine     Status: None   Collection Time: 02/08/18 12:02 AM  Result Value Ref Range   Preg Test, Ur NEGATIVE NEGATIVE    Comment: Performed at [facility], [address] [place], [place] [address]  Lactic acid, plasma     Status: Abnormal   Collection Time: 02/08/18  1:10 AM  Result Value Ref Range   Lactic Acid, Venous 2.5 (HH) 0.5 - 1.9 mmol/L    Comment: CRITICAL RESULT CALLED TO, READ BACK BY AND VERIFIED WITH REBECCA LYNN AT 0219 02/08/18.PMH Performed at [facility], [address] [place], [place] [address]    Ct Renal Joaquim Lai Study  Result Date: 02/08/2018 CLINICAL DATA:  Right-sided flank pain EXAM: CT ABDOMEN AND PELVIS WITHOUT CONTRAST TECHNIQUE: Multidetector CT imaging of the abdomen and pelvis was performed following the standard protocol without IV contrast. COMPARISON:  08/08/2015 FINDINGS: Lower chest: No acute abnormality. Hepatobiliary: Hepatic steatosis. Focal fat sparing at the gallbladder fossa. No biliary dilatation or calcified stones. Pancreas: Unremarkable. No pancreatic ductal dilatation or surrounding inflammatory changes. Spleen: Normal in size without focal abnormality. Adrenals/Urinary Tract: Adrenal glands are within  normal limits. Enlarged edematous right kidney with perinephric fluid and fat stranding. No definite hydronephrosis. Stomach/Bowel: Stomach is within normal limits. Appendix appears normal. No evidence of bowel wall thickening, distention, or inflammatory changes. Vascular/Lymphatic: No significant vascular findings are present. No enlarged abdominal or pelvic lymph nodes. Reproductive: Uterus and bilateral adnexa are unremarkable. Other: Negative for free air or free fluid. Musculoskeletal: No acute or significant osseous findings. IMPRESSION: 1. Enlarged edematous appearing right kidney with moderate right perinephric edema and fluid. No definitive hydronephrosis or ureteral stone. Findings are suspicious for acute pyelonephritis. 2. Hepatic steatosis Electronically Signed   By: Donavan Foil M.D.   On: 02/08/2018 02:10    Review of Systems  Constitutional: Positive for fever. Negative for chills.  HENT: Negative for sore throat and tinnitus.   Eyes: Negative for blurred vision and redness.  Respiratory: Negative for cough and shortness of breath.   Cardiovascular: Negative for chest pain, palpitations, orthopnea and PND.  Gastrointestinal: Positive for abdominal pain. Negative for diarrhea, nausea and vomiting.  Genitourinary: Negative for dysuria, frequency and urgency.  Musculoskeletal: Negative for joint pain and myalgias.  Skin: Negative for rash.       No lesions  Neurological: Negative for speech change, focal weakness and weakness.  Endo/Heme/Allergies: Does not bruise/bleed easily.       No temperature intolerance  Psychiatric/Behavioral: Negative for depression and suicidal ideas.    Blood pressure 111/77, pulse (!) 128, temperature 100.2 F (37.9 C), temperature source Oral, resp. rate 20, height _0  (1.676 m), weight 54.4 kg (120 lb), last menstrual period 01/24/2018, SpO2 97 %. Physical Exam  Vitals reviewed. Constitutional: She is oriented to person, place, and time. She  appears well-developed and well-nourished.  HENT:  Head: Normocephalic and atraumatic.  Mouth/Throat: Oropharynx is clear and moist.  Eyes: Pupils are equal, round, and reactive to light. Conjunctivae and EOM are normal. No scleral icterus.  Neck: Normal range of motion. Neck supple. No JVD present. No tracheal deviation present. No thyromegaly present.  Cardiovascular: Normal rate, regular rhythm and normal heart sounds. Exam reveals no gallop and no friction rub.  No murmur heard. Respiratory: Effort normal and breath sounds normal.  GI: Soft. Bowel sounds are normal. She exhibits no distension. There is no tenderness.  Genitourinary:  Genitourinary Comments: Deferred  Musculoskeletal: Normal range of motion. She exhibits no edema.  Lymphadenopathy:    She has no cervical adenopathy.  Neurological: She is alert and oriented to person, place, and time. No cranial nerve deficit. She exhibits normal muscle tone.  Skin: Skin is warm and dry. No rash noted. No erythema.  Psychiatric: She has a normal mood and affect. Her behavior is normal. Judgment and thought content normal.     Assessment/Plan This is a 33 year old female admitted for pyelonephritis. 1.  Pyelonephritis: Right acute tubular interstitial nephritis with sepsis.  Urine culture was obtained.  Manage pain.  Hydrate with intravenous fluid.  Continue antibiotics.  IV morphine as needed for severe pain. 2.  Sepsis: The patient meets criteria via fever, tachycardia and leukocytosis.  She is hemodynamically stable.  Follow blood cultures for growth and sensitivities.  Continue ceftriaxone. 3.  Hypokalemia: No arrhythmia arrhythmias.  Monitor telemetry.  Replete potassium.  Associated hyponatremia secondary to alcohol abuse. 4.  Alcoholic hepatitis: Elevated transaminase.  Check alcohol level.  Monitor for alcohol withdrawal. 5.  DVT prophylaxis: Lovenox 6.  GI prophylaxis: None The patient is a full code.  Time spent on admission  orders and patient care approximately 45 minutes  Harrie Foreman, MD 02/08/2018, 5:50 AM

## 2018-02-08 NOTE — Progress Notes (Signed)
This is a 32 year old female admitted for pyelonephritis. 1.  Pyelonephritis: Right acute tubular interstitial nephritis with sepsis.  Urine culture was obtained.  Manage pain.  Hydrate with intravenous fluid.  Continue antibiotics.  IV morphine as needed for severe pain. 2.  Sepsis: The patient meets criteria via fever, tachycardia and leukocytosis.  She is hemodynamically stable.  Follow blood cultures for growth and sensitivities.  Continue ceftriaxone. 3.  Hypokalemia: No arrhythmia arrhythmias.  Monitor telemetry.  Replete potassium.  Associated hyponatremia secondary to alcohol abuse. 4.  Alcoholic hepatitis: Elevated transaminase.  Check alcohol level.  Monitor for alcohol withdrawal. 5.  DVT prophylaxis: Lovenox 6.  GI prophylaxis: None The patient is a full code.  Time spent on admission orders and patient care approximately 45 minutes  Plans per above

## 2018-02-08 NOTE — Progress Notes (Signed)
CODE SEPSIS - PHARMACY COMMUNICATION  **Broad Spectrum Antibiotics should be administered within 1 hour of Sepsis diagnosis**  Time Code Sepsis Called/Page Received: 06/15 0221  Antibiotics Ordered: 06/15 0147  Time of 1st antibiotic administration: 06/15 0212  Additional action taken by pharmacy:   If necessary, Name of Provider/Nurse Contacted:     Eloise Harman ,PharmD Clinical Pharmacist  02/08/2018  2:35 AM

## 2018-02-08 NOTE — ED Notes (Signed)
Selinda Eon, Charge RN and Wells Guiles, primary RN made aware of pt's critical K+ level as reported by lab. Pt taken to ED Rm 3 at this time for treatment.

## 2018-02-08 NOTE — Progress Notes (Signed)
Took over care at 1615. Patient has rested quietly since. No distress noted.

## 2018-02-08 NOTE — Progress Notes (Signed)
Dr Marcille Blanco notified of pt temp 103, no orders given.

## 2018-02-09 LAB — BASIC METABOLIC PANEL
ANION GAP: 11 (ref 5–15)
BUN: <5 mg/dL — ABNORMAL LOW (ref 6–20)
CO2: 18 mmol/L — AB (ref 22–32)
Calcium: 6.6 mg/dL — ABNORMAL LOW (ref 8.9–10.3)
Chloride: 100 mmol/L — ABNORMAL LOW (ref 101–111)
Creatinine, Ser: 0.5 mg/dL (ref 0.44–1.00)
GFR calc Af Amer: >60 mL/min (ref >60)
GLUCOSE: 76 mg/dL (ref 65–99)
POTASSIUM: 3.2 mmol/L — AB (ref 3.5–5.1)
Sodium: 129 mmol/L — ABNORMAL LOW (ref 135–145)

## 2018-02-09 LAB — HEMOGLOBIN A1C
Hgb A1c MFr Bld: 5.2 % (ref 4.8–5.6)
Mean Plasma Glucose: 102.54 mg/dL

## 2018-02-09 LAB — MAGNESIUM: Magnesium: 1.3 mg/dL — ABNORMAL LOW (ref 1.7–2.4)

## 2018-02-09 MED ORDER — IPRATROPIUM-ALBUTEROL 0.5-2.5 (3) MG/3ML IN SOLN
3.0000 mL | Freq: Four times a day (QID) | RESPIRATORY_TRACT | Status: DC | PRN
  Administered 2018-02-10: 3 mL via RESPIRATORY_TRACT
  Filled 2018-02-09: qty 3

## 2018-02-09 MED ORDER — ADULT MULTIVITAMIN W/MINERALS CH
1.0000 | ORAL_TABLET | Freq: Every day | ORAL | Status: DC
  Administered 2018-02-10 – 2018-02-13 (×4): 1 via ORAL
  Filled 2018-02-09 (×4): qty 1

## 2018-02-09 MED ORDER — POTASSIUM CHLORIDE 2 MEQ/ML IV SOLN
INTRAVENOUS | Status: DC
  Administered 2018-02-09 – 2018-02-10 (×3): via INTRAVENOUS
  Filled 2018-02-09 (×8): qty 1000

## 2018-02-09 MED ORDER — BOOST / RESOURCE BREEZE PO LIQD CUSTOM
1.0000 | Freq: Three times a day (TID) | ORAL | Status: DC
  Administered 2018-02-10 – 2018-02-11 (×2): 1 via ORAL

## 2018-02-09 MED ORDER — SODIUM CHLORIDE 0.9 % IV SOLN
1.0000 g | Freq: Once | INTRAVENOUS | Status: AC
  Administered 2018-02-09: 1 g via INTRAVENOUS
  Filled 2018-02-09: qty 10

## 2018-02-09 MED ORDER — POTASSIUM CHLORIDE 20 MEQ PO PACK
40.0000 meq | PACK | Freq: Once | ORAL | Status: AC
  Administered 2018-02-09: 40 meq via ORAL
  Filled 2018-02-09: qty 2

## 2018-02-09 MED ORDER — CALCIUM CARBONATE ANTACID 500 MG PO CHEW
1.0000 | CHEWABLE_TABLET | Freq: Two times a day (BID) | ORAL | Status: DC
  Administered 2018-02-09 – 2018-02-13 (×9): 200 mg via ORAL
  Filled 2018-02-09 (×9): qty 1

## 2018-02-09 NOTE — Progress Notes (Signed)
Initial Nutrition Assessment  DOCUMENTATION CODES:   Not applicable  INTERVENTION:  Provide Boost Breeze po TID, each supplement provides 250 kcal and 9 grams of protein.  Provide daily MVI.  Encouraged adequate intake of calories and protein from meals, beverages, and ONS to maintain weight.  Patient is at risk for refeeding syndrome in setting of report of inadequate intake for 2 weeks, and hx of EtOH abuse. Potassium and magnesium are already being monitored. Also recommend checking a phosphorus level and supplementing as needed.  NUTRITION DIAGNOSIS:   Inadequate oral intake related to decreased appetite, nausea, vomiting as evidenced by per patient/family report.  GOAL:   Patient will meet greater than or equal to 90% of their needs  MONITOR:   PO intake, Supplement acceptance, Labs, Weight trends, I & O's  REASON FOR ASSESSMENT:   Malnutrition Screening Tool    ASSESSMENT:   32 year old female with PMHx of anxiety, depression, renal disorder, hx of spontaneous tension pneumothorax, EtOH abuse, alcoholic hepatitis, seizures, colitis, hx of FTT in adult and malnutrition related to N/V and inadequate intake requiring PEG tube placement on 01/11/2015 (PEG tube has since been removed) who is now admitted with sepsis secondary to acute right pyelonephritis.   Met with patient and her significant other at bedside. Patient reports her appetite has been decreased for the past few weeks likely related to her infection. She reports it is improving today and she is excited to be able to eat solid food for dinner tonight (diet has been advanced to regular). Patient reports she has been tolerating the clear liquids well so far and is feeling hunger again. She denies any nausea or abdominal pain currently. She reports that at home her appetite has been fairly good lately. She is eating 2 meals daily at home and tries to choose a source of protein at each meal. Patient reports that back in  2016 she lost a significant amount of weight due to inability to tolerate oral intake related to significant N/V. Her PEG tube was placed in May 2016 and initially was her only source of nutrition. She was then able to slowly start eating again and gaining her weight back. She reports her PEG tube was removed 1.5 years ago when she was pregnant with her daughter. She has been able to maintain her weight s/p PEG tube removal. Patient is amenable to drinking an oral nutrition supplement to make sure she is meeting acutely increased calorie/protein needs. She prefers Boost Breeze over "milky" ONS.  Patient reports her recent UBW has been 120 lbs and she has been gaining weight. Per weight history in chart she has actually been about 132-134 lbs the past year, which is a big improvement from her weight in 2016. In 2016 patient was 76-95 lbs and underweight.  Medications reviewed and include: calcium carbonate 1 tablet BID, Colace, meropenem, NS 1 L bolus with potassium chloride 20 mEq/L.  Labs reviewed: Sodium 129, Potassium 3.2, Chloride 100, CO2 18, BUN <5, Calcium 6.6 (no albumin today to correct calcium), Magnesium 1.3. On 6/15 calcium was 9.1 with an albumin of 3.9.  Patient does not meet criteria for malnutrition at this time, but is certainly at risk for malnutrition.  NUTRITION - FOCUSED PHYSICAL EXAM:    Most Recent Value  Orbital Region  No depletion  Upper Arm Region  Mild depletion  Thoracic and Lumbar Region  No depletion  Buccal Region  No depletion  Temple Region  Mild depletion  Clavicle Bone  Region  No depletion  Clavicle and Acromion Bone Region  No depletion  Scapular Bone Region  No depletion  Dorsal Hand  Mild depletion  Patellar Region  Moderate depletion  Anterior Thigh Region  Moderate depletion  Posterior Calf Region  Moderate depletion  Edema (RD Assessment)  None  Hair  Reviewed  Eyes  Reviewed  Mouth  Reviewed  Skin  Reviewed  Nails  Reviewed     Diet Order:    Diet Order           Diet regular Room service appropriate? Yes; Fluid consistency: Thin  Diet effective now          EDUCATION NEEDS:   Education needs have been addressed  Skin:  Skin Assessment: Reviewed RN Assessment(ecchymosis to left arm)  Last BM:  02/08/2018 - type 7  Height:   Ht Readings from Last 1 Encounters:  02/08/18 5' 6"  (1.676 m)    Weight:   Wt Readings from Last 1 Encounters:  02/09/18 134 lb 6.4 oz (61 kg)    Ideal Body Weight:  59.1 kg  BMI:  Body mass index is 21.69 kg/m.  Estimated Nutritional Needs:   Kcal:  1740-2000 (MSJ x 1.3-1.5)  Protein:  75-85 grams (1.2-1.4 grams/kg)  Fluid:  1.8-2.1 L/day (30-35 mL/kg)  Willey Blade, MS, RD, LDN Office: [phone] Pager: [phone] After Hours/Weekend Pager: [phone]

## 2018-02-09 NOTE — Progress Notes (Signed)
[facility] [place] NAME: Kristine Macias    MR#:  [id number]  DATE OF BIRTH:  07/18/1986  SUBJECTIVE:  CHIEF COMPLAINT:   Chief Complaint  Patient presents with  . Flank Pain  Patient feeling better, noted persistent fevers overnight, continued tachycardia, cultures noted  REVIEW OF SYSTEMS:  CONSTITUTIONAL: No fever, fatigue or weakness.  EYES: No blurred or double vision.  EARS, NOSE, AND THROAT: No tinnitus or ear pain.  RESPIRATORY: No cough, shortness of breath, wheezing or hemoptysis.  CARDIOVASCULAR: No chest pain, orthopnea, edema.  GASTROINTESTINAL: No nausea, vomiting, diarrhea or abdominal pain.  GENITOURINARY: No dysuria, hematuria.  ENDOCRINE: No polyuria, nocturia,  HEMATOLOGY: No anemia, easy bruising or bleeding SKIN: No rash or lesion. MUSCULOSKELETAL: No joint pain or arthritis.   NEUROLOGIC: No tingling, numbness, weakness.  PSYCHIATRY: No anxiety or depression.   ROS  DRUG ALLERGIES:   Allergies  Allergen Reactions  . Aspirin Shortness Of Breath  . Effexor [Venlafaxine] Anaphylaxis  . Gabapentin Anaphylaxis  . Libritabs [Chlordiazepoxide] Anaphylaxis    Tolerates lorazepam and clonazepam  . Sulfa Antibiotics Anaphylaxis  . Tetracyclines & Related Anaphylaxis  . Trazodone And Nefazodone Anaphylaxis  . Latex Hives and Itching  . Paxil [Paroxetine] Hives and Itching  . Seroquel [Quetiapine Fumarate] Hives and Itching  . Zoloft [Sertraline Hcl] Hives and Itching    VITALS:  Blood pressure 108/77, pulse (!) 106, temperature 100.2 F (37.9 C), temperature source Oral, resp. rate 20, height 5' 6"  (1.676 m), weight 61 kg (134 lb 6.4 oz), last menstrual period 01/24/2018, SpO2 96 %.  PHYSICAL EXAMINATION:  GENERAL:  32 y.o.-year-old patient lying in the bed with no acute distress.  EYES: Pupils equal, round, reactive to light and accommodation. No scleral icterus. Extraocular muscles intact.  HEENT: Head  atraumatic, normocephalic. Oropharynx and nasopharynx clear.  NECK:  Supple, no jugular venous distention. No thyroid enlargement, no tenderness.  LUNGS: Normal breath sounds bilaterally, no wheezing, rales,rhonchi or crepitation. No use of accessory muscles of respiration.  CARDIOVASCULAR: S1, S2 normal. No murmurs, rubs, or gallops.  ABDOMEN: Soft, nontender, nondistended. Bowel sounds present. No organomegaly or mass.  EXTREMITIES: No pedal edema, cyanosis, or clubbing.  NEUROLOGIC: Cranial nerves II through XII are intact. Muscle strength 5/5 in all extremities. Sensation intact. Gait not checked.  PSYCHIATRIC: The patient is alert and oriented x 3.  SKIN: No obvious rash, lesion, or ulcer.   Physical Exam LABORATORY PANEL:   CBC Recent Labs  Lab 02/08/18 0002  WBC 15.2*  HGB 15.4  HCT 45.3  PLT 121*   ------------------------------------------------------------------------------------------------------------------  Chemistries  Recent Labs  Lab 02/08/18 0002 02/09/18 0555  NA 126* 129*  K 2.5* 3.2*  CL 86* 100*  CO2 22 18*  GLUCOSE 124* 76  BUN 7 <5*  CREATININE 0.53 0.50  CALCIUM 9.1 6.6*  AST 110*  --   ALT 79*  --   ALKPHOS 102  --   BILITOT 0.7  --    ------------------------------------------------------------------------------------------------------------------  Cardiac Enzymes No results for input(s): TROPONINI in the last 168 hours. ------------------------------------------------------------------------------------------------------------------  RADIOLOGY:  Ct Renal Stone Study  Result Date: 02/08/2018 CLINICAL DATA:  Right-sided flank pain EXAM: CT ABDOMEN AND PELVIS WITHOUT CONTRAST TECHNIQUE: Multidetector CT imaging of the abdomen and pelvis was performed following the standard protocol without IV contrast. COMPARISON:  08/08/2015 FINDINGS: Lower chest: No acute abnormality. Hepatobiliary: Hepatic steatosis. Focal fat sparing at the gallbladder  fossa. No biliary dilatation or calcified  stones. Pancreas: Unremarkable. No pancreatic ductal dilatation or surrounding inflammatory changes. Spleen: Normal in size without focal abnormality. Adrenals/Urinary Tract: Adrenal glands are within normal limits. Enlarged edematous right kidney with perinephric fluid and fat stranding. No definite hydronephrosis. Stomach/Bowel: Stomach is within normal limits. Appendix appears normal. No evidence of bowel wall thickening, distention, or inflammatory changes. Vascular/Lymphatic: No significant vascular findings are present. No enlarged abdominal or pelvic lymph nodes. Reproductive: Uterus and bilateral adnexa are unremarkable. Other: Negative for free air or free fluid. Musculoskeletal: No acute or significant osseous findings. IMPRESSION: 1. Enlarged edematous appearing right kidney with moderate right perinephric edema and fluid. No definitive hydronephrosis or ureteral stone. Findings are suspicious for acute pyelonephritis. 2. Hepatic steatosis Electronically Signed   By: Donavan Foil M.D.   On: 02/08/2018 02:10    ASSESSMENT AND PLAN:  This is a 32 year old female admitted for pyelonephritis  *Acute Enterobacter ACA species/E. coli sepsis secondary to acute right pyelonephritis Continue sepsis protocol, IV fluids for rehydration, empiric meropenem, follow-up on outstanding cultures/sensitivities   *Acute right E. coli pyelonephritis Adult pain protocol and all other plans as stated above   *Acute E. coli urinary tract infection Plan of care as stated above  *Acute hyponatremia/hypochloremia/hypokalemia/hypocalcemia  Replete with IV fluids, check magnesium level, p.o. potassium x1 now, IV calcium gluconate x1, p.o. calcium twice daily, repeat blood work in the morning   *Chronic alcoholic hepatitis with elevated liver function tests  Alcohol withdrawal protocol while in house   DVT prophylaxis with Lovenox subcu  All the records are reviewed and  case discussed with Care Management/Social Workerr. Management plans discussed with the patient, family and they are in agreement.  CODE STATUS: full  TOTAL TIME TAKING CARE OF THIS PATIENT: 35 minutes.     POSSIBLE D/C IN 1-3 DAYS, DEPENDING ON CLINICAL CONDITION.   Avel Peace Zettie Gootee M.D on 02/09/2018   Between 7am to 6pm - Pager - [phone]  After 6pm go to [email or web address] - password [email or web address] [facility]  Office  [phone]  CC: Primary care physician; Patient, No Pcp Per  Note: This dictation was prepared with Dragon dictation along with smaller phrase technology. Any transcriptional errors that result from this process are unintentional.

## 2018-02-10 ENCOUNTER — Inpatient Hospital Stay: Payer: Self-pay

## 2018-02-10 LAB — CBC WITH DIFFERENTIAL/PLATELET
Basophils Absolute: 0 10*3/uL (ref 0–0.1)
Basophils Relative: 1 %
EOS ABS: 0 10*3/uL (ref 0–0.7)
Eosinophils Relative: 0 %
HCT: 38.1 % (ref 35.0–47.0)
Hemoglobin: 13 g/dL (ref 12.0–16.0)
LYMPHS ABS: 0.8 10*3/uL — AB (ref 1.0–3.6)
LYMPHS PCT: 19 %
MCH: 31.9 pg (ref 26.0–34.0)
MCHC: 34 g/dL (ref 32.0–36.0)
MCV: 93.6 fL (ref 80.0–100.0)
MONO ABS: 1.3 10*3/uL — AB (ref 0.2–0.9)
Monocytes Relative: 30 %
NEUTROS ABS: 2.3 10*3/uL (ref 1.4–6.5)
Neutrophils Relative %: 50 %
PLATELETS: 136 10*3/uL — AB (ref 150–440)
RBC: 4.06 MIL/uL (ref 3.80–5.20)
RDW: 16.2 % — AB (ref 11.5–14.5)
WBC: 4.4 10*3/uL (ref 3.6–11.0)

## 2018-02-10 LAB — BASIC METABOLIC PANEL
Anion gap: 11 (ref 5–15)
CALCIUM: 7.7 mg/dL — AB (ref 8.9–10.3)
CO2: 19 mmol/L — ABNORMAL LOW (ref 22–32)
CREATININE: 0.38 mg/dL — AB (ref 0.44–1.00)
Chloride: 102 mmol/L (ref 101–111)
GFR calc non Af Amer: >60 mL/min (ref >60)
Glucose, Bld: 102 mg/dL — ABNORMAL HIGH (ref 65–99)
Potassium: 3.9 mmol/L (ref 3.5–5.1)
SODIUM: 132 mmol/L — AB (ref 135–145)

## 2018-02-10 LAB — CULTURE, BLOOD (ROUTINE X 2): Special Requests: ADEQUATE

## 2018-02-10 LAB — ALBUMIN: ALBUMIN: 2.5 g/dL — AB (ref 3.5–5.0)

## 2018-02-10 LAB — PHOSPHORUS

## 2018-02-10 MED ORDER — BUDESONIDE 0.5 MG/2ML IN SUSP
0.5000 mg | Freq: Two times a day (BID) | RESPIRATORY_TRACT | Status: DC
  Administered 2018-02-10 – 2018-02-13 (×7): 0.5 mg via RESPIRATORY_TRACT
  Filled 2018-02-10 (×7): qty 2

## 2018-02-10 MED ORDER — DIPHENHYDRAMINE HCL 25 MG PO CAPS: 25.0000 mg | ORAL_CAPSULE | Freq: Once | ORAL | Status: DC

## 2018-02-10 MED ORDER — MAGNESIUM SULFATE 2 GM/50ML IV SOLN
2.0000 g | Freq: Once | INTRAVENOUS | Status: AC
  Administered 2018-02-10: 2 g via INTRAVENOUS
  Filled 2018-02-10: qty 50

## 2018-02-10 MED ORDER — METOPROLOL TARTRATE 25 MG PO TABS
25.0000 mg | ORAL_TABLET | Freq: Two times a day (BID) | ORAL | Status: DC
Start: 2018-02-10 — End: 2018-02-11
  Administered 2018-02-10: 25 mg via ORAL
  Filled 2018-02-10: qty 1

## 2018-02-10 MED ORDER — CEFAZOLIN SODIUM-DEXTROSE 2-4 GM/100ML-% IV SOLN
2.0000 g | Freq: Three times a day (TID) | INTRAVENOUS | Status: DC
  Administered 2018-02-10: 2 g via INTRAVENOUS
  Filled 2018-02-10 (×2): qty 100

## 2018-02-10 MED ORDER — SODIUM CHLORIDE 0.9 % IV SOLN
3.0000 g | Freq: Four times a day (QID) | INTRAVENOUS | Status: DC
  Administered 2018-02-10 – 2018-02-13 (×11): 3 g via INTRAVENOUS
  Filled 2018-02-10 (×14): qty 3

## 2018-02-10 MED ORDER — SODIUM PHOSPHATES 45 MMOLE/15ML IV SOLN
30.0000 mmol | Freq: Once | INTRAVENOUS | Status: AC
  Administered 2018-02-10: 30 mmol via INTRAVENOUS
  Filled 2018-02-10: qty 10

## 2018-02-10 MED ORDER — IPRATROPIUM-ALBUTEROL 0.5-2.5 (3) MG/3ML IN SOLN
3.0000 mL | Freq: Four times a day (QID) | RESPIRATORY_TRACT | Status: DC
  Administered 2018-02-10 – 2018-02-12 (×8): 3 mL via RESPIRATORY_TRACT
  Filled 2018-02-10 (×8): qty 3

## 2018-02-10 MED ORDER — SODIUM CHLORIDE 0.9 % IV SOLN: Freq: Once | INTRAVENOUS | Status: DC

## 2018-02-10 MED ORDER — TRAMADOL HCL 50 MG PO TABS: 50.0000 mg | ORAL_TABLET | Freq: Four times a day (QID) | ORAL | Status: DC | PRN

## 2018-02-10 MED ORDER — ALBUTEROL SULFATE (2.5 MG/3ML) 0.083% IN NEBU: 2.5000 mg | INHALATION_SOLUTION | RESPIRATORY_TRACT | Status: DC | PRN

## 2018-02-10 MED ORDER — FUROSEMIDE 10 MG/ML IJ SOLN: 20.0000 mg | Freq: Once | INTRAMUSCULAR | Status: DC

## 2018-02-10 MED ORDER — GUAIFENESIN ER 600 MG PO TB12
600.0000 mg | ORAL_TABLET | Freq: Two times a day (BID) | ORAL | Status: DC
  Administered 2018-02-10: 600 mg via ORAL
  Filled 2018-02-10 (×6): qty 1

## 2018-02-10 MED ORDER — SODIUM CHLORIDE 0.9 % IV SOLN
500.0000 mg | Freq: Every day | INTRAVENOUS | Status: DC
  Administered 2018-02-10 – 2018-02-11 (×2): 500 mg via INTRAVENOUS
  Filled 2018-02-10 (×3): qty 500

## 2018-02-10 MED ORDER — SODIUM PHOSPHATES 45 MMOLE/15ML IV SOLN: 30.0000 mmol | Freq: Once | INTRAVENOUS | Status: DC

## 2018-02-10 MED ORDER — MAGNESIUM SULFATE 2 GM/50ML IV SOLN: 2.0000 g | Freq: Once | INTRAVENOUS | Status: DC

## 2018-02-10 MED ORDER — ACETAMINOPHEN 325 MG PO TABS: 650.0000 mg | ORAL_TABLET | Freq: Once | ORAL | Status: DC

## 2018-02-10 MED ORDER — POTASSIUM CHLORIDE IN NACL 20-0.9 MEQ/L-% IV SOLN
INTRAVENOUS | Status: DC
  Administered 2018-02-10 – 2018-02-12 (×4): via INTRAVENOUS
  Filled 2018-02-10 (×6): qty 1000

## 2018-02-10 NOTE — Progress Notes (Signed)
Order received for tramadol for pain prn

## 2018-02-10 NOTE — Progress Notes (Signed)
Dr Jerelyn Charles notified of abnormal chest x ray results

## 2018-02-10 NOTE — Care Management (Addendum)
CM sent medicaid number to [facility]. There is a copy of the card in the paper chart. So far, No discharge needs identified by members of the care team

## 2018-02-10 NOTE — Progress Notes (Signed)
Chest x-ray reviewed-noted for right-sided possible pneumonia with pleural effusion Change antibiotic arrangement to Unasyn/azithromycin as patient probably has community-acquired pneumonia which was present on admission

## 2018-02-10 NOTE — Care Management (Signed)
Per [place]- patient's medicaid is only for mental health

## 2018-02-10 NOTE — Progress Notes (Signed)
I did confirm with Dr Jerelyn Charles that the patient is not on CIWA.  He does not want the patient on CIWA at this time

## 2018-02-10 NOTE — Progress Notes (Signed)
Heart rate staying in the 120's.  BP 130/90.  On call MD Dr Posey Pronto notified.  He put in an order for lopressor

## 2018-02-10 NOTE — Progress Notes (Signed)
[facility] [place] NAME: Austyn Perriello    MR#:  [id number]  DATE OF BIRTH:  02-19-1986  SUBJECTIVE:  CHIEF COMPLAINT:   Chief Complaint  Patient presents with  . Flank Pain  Discussion with the patient and nursing staff patient had some chest tightness made better with breathing treatments, patient is an avid smoker, not interested in nicotine patch, will do cessation counseling, check two-view chest x-ray, schedule breathing treatments  REVIEW OF SYSTEMS:  CONSTITUTIONAL: No fever, fatigue or weakness.  EYES: No blurred or double vision.  EARS, NOSE, AND THROAT: No tinnitus or ear pain.  RESPIRATORY: No cough, shortness of breath, wheezing or hemoptysis.  CARDIOVASCULAR: No chest pain, orthopnea, edema.  GASTROINTESTINAL: No nausea, vomiting, diarrhea or abdominal pain.  GENITOURINARY: No dysuria, hematuria.  ENDOCRINE: No polyuria, nocturia,  HEMATOLOGY: No anemia, easy bruising or bleeding SKIN: No rash or lesion. MUSCULOSKELETAL: No joint pain or arthritis.   NEUROLOGIC: No tingling, numbness, weakness.  PSYCHIATRY: No anxiety or depression.   ROS  DRUG ALLERGIES:   Allergies  Allergen Reactions  . Aspirin Shortness Of Breath  . Effexor [Venlafaxine] Anaphylaxis  . Gabapentin Anaphylaxis  . Libritabs [Chlordiazepoxide] Anaphylaxis    Tolerates lorazepam and clonazepam  . Sulfa Antibiotics Anaphylaxis  . Tetracyclines & Related Anaphylaxis  . Trazodone And Nefazodone Anaphylaxis  . Latex Hives and Itching  . Paxil [Paroxetine] Hives and Itching  . Seroquel [Quetiapine Fumarate] Hives and Itching  . Zoloft [Sertraline Hcl] Hives and Itching    VITALS:  Blood pressure 112/90, pulse (!) 101, temperature (!) 102.4 F (39.1 C), temperature source Oral, resp. rate 16, height 5' 6"  (1.676 m), weight 58.2 kg (128 lb 4.9 oz), last menstrual period 01/24/2018, SpO2 91 %.  PHYSICAL EXAMINATION:  GENERAL:  32 y.o.-year-old  patient lying in the bed with no acute distress.  EYES: Pupils equal, round, reactive to light and accommodation. No scleral icterus. Extraocular muscles intact.  HEENT: Head atraumatic, normocephalic. Oropharynx and nasopharynx clear.  NECK:  Supple, no jugular venous distention. No thyroid enlargement, no tenderness.  LUNGS: Normal breath sounds bilaterally, no wheezing, rales,rhonchi or crepitation. No use of accessory muscles of respiration.  CARDIOVASCULAR: S1, S2 normal. No murmurs, rubs, or gallops.  ABDOMEN: Soft, nontender, nondistended. Bowel sounds present. No organomegaly or mass.  EXTREMITIES: No pedal edema, cyanosis, or clubbing.  NEUROLOGIC: Cranial nerves II through XII are intact. Muscle strength 5/5 in all extremities. Sensation intact. Gait not checked.  PSYCHIATRIC: The patient is alert and oriented x 3.  SKIN: No obvious rash, lesion, or ulcer.   Physical Exam LABORATORY PANEL:   CBC Recent Labs  Lab 02/10/18 0549  WBC 4.4  HGB 13.0  HCT 38.1  PLT 136*   ------------------------------------------------------------------------------------------------------------------  Chemistries  Recent Labs  Lab 02/08/18 0002  02/09/18 1040 02/10/18 0549  NA 126*   < >  --  132*  K 2.5*   < >  --  3.9  CL 86*   < >  --  102  CO2 22   < >  --  19*  GLUCOSE 124*   < >  --  102*  BUN 7   < >  --  <5*  CREATININE 0.53   < >  --  0.38*  CALCIUM 9.1   < >  --  7.7*  MG  --   --  1.3*  --   AST 110*  --   --   --  ALT 79*  --   --   --   ALKPHOS 102  --   --   --   BILITOT 0.7  --   --   --    < > = values in this interval not displayed.   ------------------------------------------------------------------------------------------------------------------  Cardiac Enzymes No results for input(s): TROPONINI in the last 168 hours. ------------------------------------------------------------------------------------------------------------------  RADIOLOGY:  No results  found.  ASSESSMENT AND PLAN:  This is a 32 year old female admitted for pyelonephritis  *Acute Enterobacter ACA species/E. coli sepsis secondary to acute right pyelonephritis Resolving slowly Continue sepsis protocol, IV fluids for rehydration, treated initially with empiric meropenem-changed to IV Ancef based on sensitivities, follow-up on outstanding cultures   *Acute right E. coli pyelonephritis Resolving Continue adult pain protocol and all other plans as stated above   *Acute E. coli urinary tract infection Plan of care as stated above Follow-up on outstanding culture results  *Acute hyponatremia/hypochloremia/hypokalemia/hypocalcemia  Resolving Replete with IV fluids, p.o. calcium twice daily, repeat BMP   *Chronic alcoholic hepatitis with elevated liver function tests  Alcohol withdrawal protocol while in house   *Acute on COPD exacerbation Stable Inhaled corticosteroids twice daily, scheduled breathing treatments, check two-view chest x-ray, mucolytic agents, supplemental oxygen wean as tolerated, patient advised to quit smoking  *Chronic tobacco smoking abuse/dependency Cessation counseling ordered, patient refused nicotine patch  DVT prophylaxis with Lovenox subcu   All the records are reviewed and case discussed with Care Management/Social Workerr. Management plans discussed with the patient, family and they are in agreement.  CODE STATUS: full TOTAL TIME TAKING CARE OF THIS PATIENT: 35 minutes.     POSSIBLE D/C IN 1-3 DAYS, DEPENDING ON CLINICAL CONDITION.   Avel Peace Salary M.D on 02/10/2018   Between 7am to 6pm - Pager - [phone]  After 6pm go to [email or web address] - password [email or web address] [facility]  Office  [phone]  CC: Primary care physician; Patient, No Pcp Per  Note: This dictation was prepared with Dragon dictation along with smaller phrase technology. Any transcriptional errors that result from this process are  unintentional.

## 2018-02-11 LAB — BASIC METABOLIC PANEL
ANION GAP: 9 (ref 5–15)
BUN: <5 mg/dL — ABNORMAL LOW (ref 6–20)
CALCIUM: 8.2 mg/dL — AB (ref 8.9–10.3)
CO2: 26 mmol/L (ref 22–32)
Chloride: 101 mmol/L (ref 101–111)
Creatinine, Ser: 0.39 mg/dL — ABNORMAL LOW (ref 0.44–1.00)
GFR calc non Af Amer: >60 mL/min (ref >60)
Glucose, Bld: 140 mg/dL — ABNORMAL HIGH (ref 65–99)
POTASSIUM: 3.4 mmol/L — AB (ref 3.5–5.1)
Sodium: 136 mmol/L (ref 135–145)

## 2018-02-11 LAB — CULTURE, BLOOD (ROUTINE X 2): Special Requests: ADEQUATE

## 2018-02-11 LAB — PTH, INTACT AND CALCIUM
Calcium, Total (PTH): 7.7 mg/dL — ABNORMAL LOW (ref 8.7–10.2)
PTH: 26 pg/mL (ref 15–65)

## 2018-02-11 LAB — MAGNESIUM: Magnesium: 1.8 mg/dL (ref 1.7–2.4)

## 2018-02-11 LAB — PHOSPHORUS: PHOSPHORUS: 2 mg/dL — AB (ref 2.5–4.6)

## 2018-02-11 MED ORDER — SODIUM PHOSPHATES 45 MMOLE/15ML IV SOLN: 30.0000 mmol | Freq: Once | INTRAVENOUS | Status: DC

## 2018-02-11 MED ORDER — DEXTROSE 5 % IV SOLN
30.0000 mmol | Freq: Once | INTRAVENOUS | Status: AC
  Administered 2018-02-11: 30 mmol via INTRAVENOUS
  Filled 2018-02-11: qty 10

## 2018-02-11 NOTE — Consult Note (Signed)
[facility] Infectious [facility]     Reason for Consult: Pyelonephritis    Referring Physician: Estanislado Spire Date of Admission:  02/08/2018   Active Problems:   Pyelonephritis   HPI: Kristine Macias is a 32 y.o. female with ETOH abuse, TCP, admitted with R flank pain and fevers for 3 days PTA. On admit febrile and wbc 15. CT with R pyelonephritis. Started IV abx. UCX BCX + e coli.  Still febrile. Some concern for R base PNA, effusion.  She was vomiting prior to admission and reports could have aspirated. Feeling better but still R flank pain.   Past Medical History:  Diagnosis Date  . Alcohol abuse 11/2014   drinking since age 36. chronic, recurrent. at least 2 detox admits before 2015.   Marland Kitchen Alcoholic hepatitis 01/2835   hepatic steatosis on 11/2014 ultrasound.   . Breast cancer [facility])    age 33, lump removed from left breast  . Chlamydia 2007   bacterial vaginosis 09/2011  . Colitis 12/2014   colonoscopy for diarrhea and abnml CT 01/06/15: erythmatous TI (path: active ileitis, ? emerging IBD), rectal erythema (path: mucosal prolapse). Random bx of normal colon (path unremarkable)   . Congenital deafness    left ear only.  Also noted in mother and sibling (unilateral).   . Depression with anxiety initally at age 22  . Failure to thrive in adult 12/2014.    Malnutrition: n/v, not eating, weight loss, BMI 14.  s/p 01/11/2015 PEG (Dr Dorna Leitz).   . Hypertensive urgency   . Irregular heart beat 2010   wt/diet related after evaluation  . Pancreas divisum 02/2015   Type 1 seen on CT.   Marland Kitchen Pneumothorax, spontaneous, tension   . Renal disorder   . Seizure ([place]) 06/2015   due to ETOH/benzo withdrawal.   . Spontaneous pneumothorax 08/2012   right.  chest tube placed.   . Thrombocytopenia ([place]) 04/2007   Past Surgical History:  Procedure Laterality Date  . CESAREAN SECTION  07/2009    x 1.  G3, Para 2012.   . CHEST TUBE INSERTION    . COLONOSCOPY WITH PROPOFOL N/A 01/06/2015   [facility], Dr Rayann Heman.  colonoscopy for diarrhea and abnml CT 01/06/15: erythmatous TI (path: active ileitis, ? emerging IBD), rectal erythema (path: mucosal prolapse). Random bx of normal colon (path unremarkable)   . ESOPHAGOGASTRODUODENOSCOPY N/A 01/06/2015   ;[facility], Dr Rayann Heman. For wt loss, N/V: Normal study, duodenal biopsy/pathology: chronic active duodenitis.   Marland Kitchen ESOPHAGOGASTRODUODENOSCOPY N/A 01/11/2015   Rein-normal with PEG placement  . PEG PLACEMENT N/A 01/11/2015   [facility], Dr Rayann Heman.  for N/V/wt loss/severe malnutrition.    Social History   Tobacco Use  . Smoking status: Current Every Day Smoker    Packs/day: 1.00    Years: 13.00    Pack years: 13.00    Types: Cigarettes  . Smokeless tobacco: Never Used  Substance Use Topics  . Alcohol use: Yes    Alcohol/week: 21.6 oz    Types: 36 Cans of beer per week    Comment: Binge Drink that usually last for 2 days.  Last drink: 24 hours ago.   . Drug use: No   Family History  Problem Relation Age of Onset  . Thyroid disease Mother   . Cancer Mother        breast cancer  . Cancer Father        lung cancer  . Stroke Other   . Crohn's disease Brother   . Cancer  Maternal Grandmother        breast cancer  . Anesthesia problems Neg Hx     Allergies:  Allergies  Allergen Reactions  . Aspirin Shortness Of Breath  . Effexor [Venlafaxine] Anaphylaxis  . Gabapentin Anaphylaxis  . Libritabs [Chlordiazepoxide] Anaphylaxis    Tolerates lorazepam and clonazepam  . Sulfa Antibiotics Anaphylaxis  . Tetracyclines & Related Anaphylaxis  . Trazodone And Nefazodone Anaphylaxis  . Latex Hives and Itching  . Paxil [Paroxetine] Hives and Itching  . Seroquel [Quetiapine Fumarate] Hives and Itching  . Zoloft [Sertraline Hcl] Hives and Itching    Current antibiotics: Antibiotics Given (last 72 hours)    Date/Time Action Medication Dose Rate   02/08/18 1608 New Bag/Given   meropenem (MERREM) 1 g in sodium chloride 0.9 % 100 mL IVPB 1 g 200 mL/hr   02/08/18 2149 New  Bag/Given   meropenem (MERREM) 1 g in sodium chloride 0.9 % 100 mL IVPB 1 g 200 mL/hr   02/09/18 0516 New Bag/Given   meropenem (MERREM) 1 g in sodium chloride 0.9 % 100 mL IVPB 1 g 200 mL/hr   02/09/18 1751 New Bag/Given   meropenem (MERREM) 1 g in sodium chloride 0.9 % 100 mL IVPB 1 g 200 mL/hr   02/09/18 2129 New Bag/Given   meropenem (MERREM) 1 g in sodium chloride 0.9 % 100 mL IVPB 1 g 200 mL/hr   02/10/18 0607 New Bag/Given   meropenem (MERREM) 1 g in sodium chloride 0.9 % 100 mL IVPB 1 g 200 mL/hr   02/10/18 1327 New Bag/Given   ceFAZolin (ANCEF) IVPB 2g/100 mL premix 2 g 200 mL/hr   02/10/18 1450 New Bag/Given   azithromycin (ZITHROMAX) 500 mg in sodium chloride 0.9 % 250 mL IVPB 500 mg 250 mL/hr   02/10/18 1736 New Bag/Given   Ampicillin-Sulbactam (UNASYN) 3 g in sodium chloride 0.9 % 100 mL IVPB 3 g 200 mL/hr   02/10/18 2311 New Bag/Given   Ampicillin-Sulbactam (UNASYN) 3 g in sodium chloride 0.9 % 100 mL IVPB 3 g 200 mL/hr   02/11/18 0736 New Bag/Given   Ampicillin-Sulbactam (UNASYN) 3 g in sodium chloride 0.9 % 100 mL IVPB 3 g 200 mL/hr   02/11/18 1146 New Bag/Given   Ampicillin-Sulbactam (UNASYN) 3 g in sodium chloride 0.9 % 100 mL IVPB 3 g 200 mL/hr      MEDICATIONS: . budesonide (PULMICORT) nebulizer solution  0.5 mg Nebulization BID  . calcium carbonate  1 tablet Oral BID  . docusate sodium  100 mg Oral BID  . enoxaparin (LOVENOX) injection  40 mg Subcutaneous Q24H  . feeding supplement  1 Container Oral TID BM  . guaiFENesin  600 mg Oral BID  . ipratropium-albuterol  3 mL Nebulization QID  . multivitamin with minerals  1 tablet Oral Daily    Review of Systems - 11 systems reviewed and negative per HPI   OBJECTIVE: Temp:  [97.6 F (36.4 C)-100.5 F (38.1 C)] 97.6 F (36.4 C) (06/18 1301) Pulse Rate:  [89-124] 107 (06/18 1301) Resp:  [16-20] 20 (06/18 1301) BP: (90-133)/(66-95) 90/66 (06/18 1301) SpO2:  [92 %-98 %] 96 % (06/18 1301) Physical Exam   Constitutional:  oriented to person, place, and time. appears well-developed and well-nourished. No distress.  HENT: [place]/AT, PERRLA, no scleral icterus Mouth/Throat: Oropharynx is clear and moist. No oropharyngeal exudate.  Cardiovascular: Normal rate, regular rhythm and normal heart sounds. Exam reveals no gallop and no friction rub.  No murmur heard.  Pulmonary/Chest: Effort normal  and breath sounds normal. No respiratory distress.  has no wheezes.  Neck = supple, no nuchal rigidity Abdominal: Soft. Bowel sounds are normal.  exhibits no distension. There is no tenderness. Mild R CVAT Lymphadenopathy: no cervical adenopathy. No axillary adenopathy Neurological: alert and oriented to person, place, and time.  Skin: Skin is warm and dry. No rash noted. No erythema.  Psychiatric: a normal mood and affect.  behavior is normal.    LABS: Results for orders placed or performed during the hospital encounter of 02/08/18 (from the past 48 hour(s))  CBC with Differential/Platelet     Status: Abnormal   Collection Time: 02/10/18  5:49 AM  Result Value Ref Range   WBC 4.4 3.6 - 11.0 K/uL   RBC 4.06 3.80 - 5.20 MIL/uL   Hemoglobin 13.0 12.0 - 16.0 g/dL   HCT 38.1 35.0 - 47.0 %   MCV 93.6 80.0 - 100.0 fL   MCH 31.9 26.0 - 34.0 pg   MCHC 34.0 32.0 - 36.0 g/dL   RDW 16.2 (H) 11.5 - 14.5 %   Platelets 136 (L) 150 - 440 K/uL   Neutrophils Relative % 50 %   Lymphocytes Relative 19 %   Monocytes Relative 30 %   Eosinophils Relative 0 %   Basophils Relative 1 %   Neutro Abs 2.3 1.4 - 6.5 K/uL   Lymphs Abs 0.8 (L) 1.0 - 3.6 K/uL   Monocytes Absolute 1.3 (H) 0.2 - 0.9 K/uL   Eosinophils Absolute 0.0 0 - 0.7 K/uL   Basophils Absolute 0.0 0 - 0.1 K/uL   Smear Review MORPHOLOGY UNREMARKABLE     Comment: Performed at [facility], [address] [place], [place] [address]  Basic metabolic panel     Status: Abnormal   Collection Time: 02/10/18  5:49 AM  Result Value Ref Range   Sodium 132  (L) 135 - 145 mmol/L   Potassium 3.9 3.5 - 5.1 mmol/L   Chloride 102 101 - 111 mmol/L   CO2 19 (L) 22 - 32 mmol/L   Glucose, Bld 102 (H) 65 - 99 mg/dL   BUN <5 (L) 6 - 20 mg/dL   Creatinine, Ser 0.38 (L) 0.44 - 1.00 mg/dL   Calcium 7.7 (L) 8.9 - 10.3 mg/dL   GFR calc non Af Amer >60 >60 mL/min   GFR calc Af Amer >60 >60 mL/min    Comment: (NOTE) The eGFR has been calculated using the CKD EPI equation. This calculation has not been validated in all clinical situations. eGFR's persistently <60 mL/min signify possible Chronic Kidney Disease.    Anion gap 11 5 - 15    Comment: Performed at [facility], [address] [place], [place] [address]  PTH, intact and calcium     Status: Abnormal   Collection Time: 02/10/18  5:49 AM  Result Value Ref Range   PTH 26 15 - 65 pg/mL   Calcium, Total (PTH) 7.7 (L) 8.7 - 10.2 mg/dL   PTH Interp Comment     Comment: (NOTE) Interpretation                 Intact PTH    Calcium                                (pg/mL)      (mg/dL) Normal  15 - 65     8.6 - 10.2 Primary Hyperparathyroidism         >65          >10.2 Secondary Hyperparathyroidism       >65          <10.2 Non-Parathyroid Hypercalcemia       <65          >10.2 Hypoparathyroidism                  <15          < 8.6 Non-Parathyroid Hypocalcemia    15 - 65          < 8.6 Performed At: [facility] [place], [place] [id number] Rush Farmer MD ML:[id number] Performed at [facility], [address] [place], [place] [address]   Albumin     Status: Abnormal   Collection Time: 02/10/18  5:49 AM  Result Value Ref Range   Albumin 2.5 (L) 3.5 - 5.0 g/dL    Comment: Performed at [facility], [address] [place], [place] [address]  Phosphorus     Status: Abnormal   Collection Time: 02/10/18  5:49 AM  Result Value Ref Range   Phosphorus <1.0 (LL) 2.5 - 4.6 mg/dL    Comment: CRITICAL RESULT CALLED TO, READ  BACK BY AND VERIFIED WITH TAMARA CONYERS AT 7494 02/10/18.PMH Performed at [facility], [address] [place], [place] [address]   Phosphorus     Status: Abnormal   Collection Time: 02/11/18  1:44 AM  Result Value Ref Range   Phosphorus 2.0 (L) 2.5 - 4.6 mg/dL    Comment: Performed at [facility], [address] [place], [place] [address]  Magnesium     Status: None   Collection Time: 02/11/18  1:44 AM  Result Value Ref Range   Magnesium 1.8 1.7 - 2.4 mg/dL    Comment: Performed at [facility], [address] [place], [place] [address]  Basic metabolic panel     Status: Abnormal   Collection Time: 02/11/18  1:44 AM  Result Value Ref Range   Sodium 136 135 - 145 mmol/L   Potassium 3.4 (L) 3.5 - 5.1 mmol/L   Chloride 101 101 - 111 mmol/L   CO2 26 22 - 32 mmol/L   Glucose, Bld 140 (H) 65 - 99 mg/dL   BUN <5 (L) 6 - 20 mg/dL   Creatinine, Ser 0.39 (L) 0.44 - 1.00 mg/dL   Calcium 8.2 (L) 8.9 - 10.3 mg/dL   GFR calc non Af Amer >60 >60 mL/min   GFR calc Af Amer >60 >60 mL/min    Comment: (NOTE) The eGFR has been calculated using the CKD EPI equation. This calculation has not been validated in all clinical situations. eGFR's persistently <60 mL/min signify possible Chronic Kidney Disease.    Anion gap 9 5 - 15    Comment: Performed at [facility], [address] [place], [place] [address]   No components found for: ESR, C REACTIVE PROTEIN MICRO: Recent Results (from the past 720 hour(s))  Culture, blood (routine x 2)     Status: Abnormal   Collection Time: 02/08/18  1:10 AM  Result Value Ref Range Status   Specimen Description   Final    BLOOD BLOOD RIGHT FOREARM Performed at [facility], [address] [place],  [address]    Special Requests   Final    BOTTLES  DRAWN AEROBIC AND ANAEROBIC Blood Culture adequate volume Performed at [facility], [address] [place], [place] [address]    Culture   Setup Time   Final    GRAM NEGATIVE RODS IN BOTH AEROBIC AND ANAEROBIC BOTTLES CRITICAL RESULT CALLED TO, READ BACK BY AND VERIFIED WITH: [place] AT 1302 02/08/18 SDR Performed at [facility], [place] [address] [place], [place] [address]    Culture ESCHERICHIA COLI (A)  Final   Report Status 02/10/2018 FINAL  Final   Organism ID, Bacteria ESCHERICHIA COLI  Final      Susceptibility   Escherichia coli - MIC*    AMPICILLIN >=32 RESISTANT Resistant     CEFAZOLIN <=4 SENSITIVE Sensitive     CEFEPIME <=1 SENSITIVE Sensitive     CEFTAZIDIME <=1 SENSITIVE Sensitive     CEFTRIAXONE <=1 SENSITIVE Sensitive     CIPROFLOXACIN <=0.25 SENSITIVE Sensitive     GENTAMICIN <=1 SENSITIVE Sensitive     IMIPENEM <=0.25 SENSITIVE Sensitive     TRIMETH/SULFA >=320 RESISTANT Resistant     AMPICILLIN/SULBACTAM 4 SENSITIVE Sensitive     PIP/TAZO <=4 SENSITIVE Sensitive     Extended ESBL NEGATIVE Sensitive     * ESCHERICHIA COLI  Culture, blood (routine x 2)     Status: Abnormal   Collection Time: 02/08/18  1:10 AM  Result Value Ref Range Status   Specimen Description   Final    BLOOD BLOOD RIGHT HAND Performed at [facility], [address] [place], [place] [address]    Special Requests   Final    BOTTLES DRAWN AEROBIC AND ANAEROBIC Blood Culture adequate volume Performed at [facility], [address] [place], [place] [address]    Culture  Setup Time   Final    GRAM NEGATIVE RODS IN BOTH AEROBIC AND ANAEROBIC BOTTLES CRITICAL VALUE NOTED.  VALUE IS CONSISTENT WITH PREVIOUSLY REPORTED AND CALLED VALUE. Performed at [facility], [address] [place], [place] [address]    Culture (A)  Final    ESCHERICHIA COLI SUSCEPTIBILITIES PERFORMED ON PREVIOUS CULTURE WITHIN THE LAST 5 DAYS. Performed at [facility], [address], [place], [place] [address]    Report Status 02/10/2018 FINAL  Final  Blood Culture ID Panel (Reflexed)     Status: Abnormal    Collection Time: 02/08/18  1:10 AM  Result Value Ref Range Status   Enterococcus species NOT DETECTED NOT DETECTED Final   Listeria monocytogenes NOT DETECTED NOT DETECTED Final   Staphylococcus species NOT DETECTED NOT DETECTED Final   Staphylococcus aureus NOT DETECTED NOT DETECTED Final   Streptococcus species NOT DETECTED NOT DETECTED Final   Streptococcus agalactiae NOT DETECTED NOT DETECTED Final   Streptococcus pneumoniae NOT DETECTED NOT DETECTED Final   Streptococcus pyogenes NOT DETECTED NOT DETECTED Final   Acinetobacter baumannii NOT DETECTED NOT DETECTED Final   Enterobacteriaceae species DETECTED (A) NOT DETECTED Final    Comment: Enterobacteriaceae represent a large family of gram-negative bacteria, not a single organism. CRITICAL RESULT CALLED TO, READ BACK BY AND VERIFIED WITH: NATE COOKSON 02/08/18 @ 1302  SDR/MLK    Enterobacter cloacae complex NOT DETECTED NOT DETECTED Final   Escherichia coli DETECTED (A) NOT DETECTED Final    Comment: CRITICAL RESULT CALLED TO, READ BACK BY AND VERIFIED WITH: NATE COOKSON 02/08/18 @ 1302  SDR/MLK    Klebsiella oxytoca NOT DETECTED NOT DETECTED Final   Klebsiella pneumoniae NOT DETECTED NOT DETECTED Final   Proteus  species NOT DETECTED NOT DETECTED Final   Serratia marcescens NOT DETECTED NOT DETECTED Final   Carbapenem resistance NOT DETECTED NOT DETECTED Final   Haemophilus influenzae NOT DETECTED NOT DETECTED Final   Neisseria meningitidis NOT DETECTED NOT DETECTED Final   Pseudomonas aeruginosa NOT DETECTED NOT DETECTED Final   Candida albicans NOT DETECTED NOT DETECTED Final   Candida glabrata NOT DETECTED NOT DETECTED Final   Candida krusei NOT DETECTED NOT DETECTED Final   Candida parapsilosis NOT DETECTED NOT DETECTED Final   Candida tropicalis NOT DETECTED NOT DETECTED Final    Comment: Performed at [facility], [address] [place], [place] [address]  MRSA PCR Screening     Status: None   Collection  Time: 02/08/18  6:23 AM  Result Value Ref Range Status   MRSA by PCR NEGATIVE NEGATIVE Final    Comment:        The GeneXpert MRSA Assay (FDA approved for NASAL specimens only), is one component of a comprehensive MRSA colonization surveillance program. It is not intended to diagnose MRSA infection nor to guide or monitor treatment for MRSA infections. Performed at [facility], [address], [place], [place] [address]     IMAGING: Dg Chest 2 View  Result Date: 02/10/2018 CLINICAL DATA:  COPD.  Chest tightness. EXAM: CHEST - 2 VIEW COMPARISON:  Chest radiographs 09/20/2016. CT abdomen and pelvis 02/08/2018. FINDINGS: The cardiomediastinal silhouette is within normal limits. There are new small pleural effusions, right larger than left. Peribronchial thickening is noted. There is patchy right basilar airspace opacity with evidence of volume loss. No acute osseous abnormality is seen. IMPRESSION: Bronchitic changes with new pleural effusions and right basilar atelectasis or pneumonia. Electronically Signed   By: Logan Bores M.D.   On: 02/10/2018 11:55   Ct Renal Stone Study  Result Date: 02/08/2018 CLINICAL DATA:  Right-sided flank pain EXAM: CT ABDOMEN AND PELVIS WITHOUT CONTRAST TECHNIQUE: Multidetector CT imaging of the abdomen and pelvis was performed following the standard protocol without IV contrast. COMPARISON:  08/08/2015 FINDINGS: Lower chest: No acute abnormality. Hepatobiliary: Hepatic steatosis. Focal fat sparing at the gallbladder fossa. No biliary dilatation or calcified stones. Pancreas: Unremarkable. No pancreatic ductal dilatation or surrounding inflammatory changes. Spleen: Normal in size without focal abnormality. Adrenals/Urinary Tract: Adrenal glands are within normal limits. Enlarged edematous right kidney with perinephric fluid and fat stranding. No definite hydronephrosis. Stomach/Bowel: Stomach is within normal limits. Appendix appears normal. No evidence  of bowel wall thickening, distention, or inflammatory changes. Vascular/Lymphatic: No significant vascular findings are present. No enlarged abdominal or pelvic lymph nodes. Reproductive: Uterus and bilateral adnexa are unremarkable. Other: Negative for free air or free fluid. Musculoskeletal: No acute or significant osseous findings. IMPRESSION: 1. Enlarged edematous appearing right kidney with moderate right perinephric edema and fluid. No definitive hydronephrosis or ureteral stone. Findings are suspicious for acute pyelonephritis. 2. Hepatic steatosis Electronically Signed   By: Donavan Foil M.D.   On: 02/08/2018 02:10    Assessment:   Kristine Macias is a 32 y.o. female with etoh abuse admitted with R fank pain and fevers. Found to have E coli UTI and bacteremia with pyelo on CT scan. Possible aspiration as well.  Recommendations Pyelonephritis - E coli sepsis. Cont unasyn while inpt.  Can dc when stable on keflex for 21 days total of abx since admission   Possible aspiration - cont unasyn while inpatient then transition to oral keflex,  Thank you very much for allowing me to  participate in the care of this patient. Please call with questions.   Cheral Marker. Ola Spurr, MD

## 2018-02-11 NOTE — Progress Notes (Signed)
[facility] [facility] [place] NAME: Kristine Macias    MR#:  [id number]  DATE OF BIRTH:  [birth year]  SUBJECTIVE:  CHIEF COMPLAINT:   Chief Complaint  Patient presents with  . Flank Pain   Better shortness of breath, on oxygen by nasal cannula 2 L.  REVIEW OF SYSTEMS:  CONSTITUTIONAL: No fever, fatigue or weakness.  EYES: No blurred or double vision.  EARS, NOSE, AND THROAT: No tinnitus or ear pain.  RESPIRATORY: No cough, has shortness of breath, no wheezing or hemoptysis.  CARDIOVASCULAR: No chest pain, orthopnea, edema.  GASTROINTESTINAL: No nausea, vomiting, diarrhea or abdominal pain.  GENITOURINARY: No dysuria, hematuria.  ENDOCRINE: No polyuria, nocturia,  HEMATOLOGY: No anemia, easy bruising or bleeding SKIN: No rash or lesion. MUSCULOSKELETAL: No joint pain or arthritis.   NEUROLOGIC: No tingling, numbness, weakness.  PSYCHIATRY: No anxiety or depression.   ROS  DRUG ALLERGIES:   Allergies  Allergen Reactions  . Aspirin Shortness Of Breath  . Effexor [Venlafaxine] Anaphylaxis  . Gabapentin Anaphylaxis  . Libritabs [Chlordiazepoxide] Anaphylaxis    Tolerates lorazepam and clonazepam  . Sulfa Antibiotics Anaphylaxis  . Tetracyclines & Related Anaphylaxis  . Trazodone And Nefazodone Anaphylaxis  . Latex Hives and Itching  . Paxil [Paroxetine] Hives and Itching  . Seroquel [Quetiapine Fumarate] Hives and Itching  . Zoloft [Sertraline Hcl] Hives and Itching    VITALS:  Blood pressure 90/66, pulse (!) 107, temperature 97.6 F (36.4 C), temperature source Oral, resp. rate 20, height 5' 6"  (1.676 m), weight 128 lb 4.9 oz (58.2 kg), last menstrual period 01/24/2018, SpO2 96 %.  PHYSICAL EXAMINATION:  GENERAL:  32 y.o.-year-old patient lying in the bed with no acute distress.  EYES: Pupils equal, round, reactive to light and accommodation. No scleral icterus. Extraocular muscles intact.  HEENT: Head atraumatic, normocephalic.  Oropharynx and nasopharynx clear.  NECK:  Supple, no jugular venous distention. No thyroid enlargement, no tenderness.  LUNGS: Normal breath sounds bilaterally, no wheezing, rales,rhonchi or crepitation. No use of accessory muscles of respiration.  CARDIOVASCULAR: S1, S2 normal. No murmurs, rubs, or gallops.  ABDOMEN: Soft, nontender, nondistended. Bowel sounds present. No organomegaly or mass.  EXTREMITIES: No pedal edema, cyanosis, or clubbing.  NEUROLOGIC: Cranial nerves II through XII are intact. Muscle strength 5/5 in all extremities. Sensation intact. Gait not checked.  PSYCHIATRIC: The patient is alert and oriented x 3.  SKIN: No obvious rash, lesion, or ulcer.   Physical Exam LABORATORY PANEL:   CBC Recent Labs  Lab 02/10/18 0549  WBC 4.4  HGB 13.0  HCT 38.1  PLT 136*   ------------------------------------------------------------------------------------------------------------------  Chemistries  Recent Labs  Lab 02/08/18 0002  02/11/18 0144  NA 126*   < > 136  K 2.5*   < > 3.4*  CL 86*   < > 101  CO2 22   < > 26  GLUCOSE 124*   < > 140*  BUN 7   < > <5*  CREATININE 0.53   < > 0.39*  CALCIUM 9.1   < > 8.2*  MG  --    < > 1.8  AST 110*  --   --   ALT 79*  --   --   ALKPHOS 102  --   --   BILITOT 0.7  --   --    < > = values in this interval not displayed.   ------------------------------------------------------------------------------------------------------------------  Cardiac Enzymes No results for input(s): TROPONINI in the last 168  hours. ------------------------------------------------------------------------------------------------------------------  RADIOLOGY:  Dg Chest 2 View  Result Date: 02/10/2018 CLINICAL DATA:  COPD.  Chest tightness. EXAM: CHEST - 2 VIEW COMPARISON:  Chest radiographs 09/20/2016. CT abdomen and pelvis 02/08/2018. FINDINGS: The cardiomediastinal silhouette is within normal limits. There are new small pleural effusions, right  larger than left. Peribronchial thickening is noted. There is patchy right basilar airspace opacity with evidence of volume loss. No acute osseous abnormality is seen. IMPRESSION: Bronchitic changes with new pleural effusions and right basilar atelectasis or pneumonia. Electronically Signed   By: Logan Bores M.D.   On: 02/10/2018 11:55    ASSESSMENT AND PLAN:  This is a 32 year old female admitted for pyelonephritis  *Acute Enterobacter ACA species/E. coli sepsis secondary to acute right pyelonephritis Resolving slowly She was treated initially with empiric meropenem-changed to IV Unasyn based on blood culture and urine culture sensitivities.   *Acute right E. coli pyelonephritis Resolving Continue adult pain protocol and all other plans as stated above   *Acute E. coli urinary tract infection Plan of care as stated above Follow-up on outstanding culture results  *Acute hyponatremia/hypochloremia/hypokalemia/hypocalcemia  Improving with IV fluids, p.o. calcium twice daily.  *Chronic alcoholic hepatitis with elevated liver function tests  Alcohol withdrawal protocol while in house   *Acute on COPD exacerbation Stable Inhaled corticosteroids twice daily, scheduled breathing treatments, check two-view chest x-ray, mucolytic agents, supplemental oxygen wean as tolerated, patient advised to quit smoking  Right-sided possible pneumonia with pleural effusion. Changed antibiotic arrangement to Unasyn/azithromycin  *Chronic tobacco smoking abuse/dependency Smoking cessation was counseled for 3 minutes.  DVT prophylaxis with Lovenox subcu   All the records are reviewed and case discussed with Care Management/Social Workerr. Management plans discussed with the patient, family and they are in agreement.  CODE STATUS: full TOTAL TIME TAKING CARE OF THIS PATIENT: 35 minutes.     POSSIBLE D/C IN 1-3 DAYS, DEPENDING ON CLINICAL CONDITION.   Demetrios Loll M.D on 02/11/2018    Between 7am to 6pm - Pager - [phone]  After 6pm go to [email or web address] - password [email or web address] [facility]  Office  [phone]  CC: Primary care physician; Patient, No Pcp Per  Note: This dictation was prepared with Dragon dictation along with smaller phrase technology. Any transcriptional errors that result from this process are unintentional.

## 2018-02-12 LAB — MAGNESIUM: Magnesium: 1.6 mg/dL — ABNORMAL LOW (ref 1.7–2.4)

## 2018-02-12 LAB — PHOSPHORUS: Phosphorus: 2.6 mg/dL (ref 2.5–4.6)

## 2018-02-12 MED ORDER — MAGNESIUM SULFATE 2 GM/50ML IV SOLN
2.0000 g | Freq: Once | INTRAVENOUS | Status: AC
  Administered 2018-02-12: 2 g via INTRAVENOUS
  Filled 2018-02-12: qty 50

## 2018-02-12 MED ORDER — IPRATROPIUM-ALBUTEROL 0.5-2.5 (3) MG/3ML IN SOLN
3.0000 mL | Freq: Four times a day (QID) | RESPIRATORY_TRACT | Status: DC
  Administered 2018-02-12 – 2018-02-13 (×4): 3 mL via RESPIRATORY_TRACT
  Filled 2018-02-12 (×4): qty 3

## 2018-02-12 NOTE — Progress Notes (Signed)
Kristine Macias INFECTIOUS DISEASE PROGRESS NOTE Date of Admission:  02/08/2018     ID: Kristine Macias is a 32 y.o. female with Pyelo Active Problems:   Pyelonephritis   Subjective: No further fevers, wbc down. Breathing better  ROS  Eleven systems are reviewed and negative except per hpi  Medications:  Antibiotics Given (last 72 hours)    Date/Time Action Medication Dose Rate   02/09/18 1751 New Bag/Given   meropenem (MERREM) 1 g in sodium chloride 0.9 % 100 mL IVPB 1 g 200 mL/hr   02/09/18 2129 New Bag/Given   meropenem (MERREM) 1 g in sodium chloride 0.9 % 100 mL IVPB 1 g 200 mL/hr   02/10/18 0607 New Bag/Given   meropenem (MERREM) 1 g in sodium chloride 0.9 % 100 mL IVPB 1 g 200 mL/hr   02/10/18 1327 New Bag/Given   ceFAZolin (ANCEF) IVPB 2g/100 mL premix 2 g 200 mL/hr   02/10/18 1450 New Bag/Given   azithromycin (ZITHROMAX) 500 mg in sodium chloride 0.9 % 250 mL IVPB 500 mg 250 mL/hr   02/10/18 1736 New Bag/Given   Ampicillin-Sulbactam (UNASYN) 3 g in sodium chloride 0.9 % 100 mL IVPB 3 g 200 mL/hr   02/10/18 2311 New Bag/Given   Ampicillin-Sulbactam (UNASYN) 3 g in sodium chloride 0.9 % 100 mL IVPB 3 g 200 mL/hr   02/11/18 0736 New Bag/Given   Ampicillin-Sulbactam (UNASYN) 3 g in sodium chloride 0.9 % 100 mL IVPB 3 g 200 mL/hr   02/11/18 1146 New Bag/Given   Ampicillin-Sulbactam (UNASYN) 3 g in sodium chloride 0.9 % 100 mL IVPB 3 g 200 mL/hr   02/11/18 1729 New Bag/Given   azithromycin (ZITHROMAX) 500 mg in sodium chloride 0.9 % 250 mL IVPB 500 mg 250 mL/hr   02/11/18 1729 New Bag/Given   Ampicillin-Sulbactam (UNASYN) 3 g in sodium chloride 0.9 % 100 mL IVPB 3 g 200 mL/hr   02/12/18 0054 New Bag/Given   Ampicillin-Sulbactam (UNASYN) 3 g in sodium chloride 0.9 % 100 mL IVPB 3 g 200 mL/hr   02/12/18 0541 New Bag/Given   Ampicillin-Sulbactam (UNASYN) 3 g in sodium chloride 0.9 % 100 mL IVPB 3 g 200 mL/hr   02/12/18 1212 New Bag/Given   Ampicillin-Sulbactam (UNASYN)  3 g in sodium chloride 0.9 % 100 mL IVPB 3 g 200 mL/hr     . budesonide (PULMICORT) nebulizer solution  0.5 mg Nebulization BID  . calcium carbonate  1 tablet Oral BID  . docusate sodium  100 mg Oral BID  . enoxaparin (LOVENOX) injection  40 mg Subcutaneous Q24H  . feeding supplement  1 Container Oral TID BM  . guaiFENesin  600 mg Oral BID  . ipratropium-albuterol  3 mL Nebulization Q6H  . multivitamin with minerals  1 tablet Oral Daily    Objective: Vital signs in last 24 hours: Temp:  [98.5 F (36.9 C)-98.9 F (37.2 C)] 98.6 F (37 C) (06/19 1141) Pulse Rate:  [98-111] 102 (06/19 1141) Resp:  [17-20] 17 (06/19 1141) BP: (99-114)/(71-81) 100/73 (06/19 1141) SpO2:  [94 %-98 %] 98 % (06/19 1141) Weight:  [59.2 kg (130 lb 8.2 oz)] 59.2 kg (130 lb 8.2 oz) (06/19 0454) Constitutional:  oriented to person, place, and time. appears well-developed and well-nourished. No distress.  HENT: [place]/AT, PERRLA, no scleral icterus Mouth/Throat: Oropharynx is clear and moist. No oropharyngeal exudate.  Cardiovascular: Normal rate, regular rhythm and normal heart sounds. Exam reveals no gallop and no friction rub.  No murmur heard.  Pulmonary/Chest:  Effort normal and breath sounds normal. No respiratory distress.  has no wheezes.  Neck = supple, no nuchal rigidity Abdominal: Soft. Bowel sounds are normal.  exhibits no distension. There is no tenderness. Mild R CVAT Lymphadenopathy: no cervical adenopathy. No axillary adenopathy Neurological: alert and oriented to person, place, and time.  Skin: Skin is warm and dry. No rash noted. No erythema.  Psychiatric: a normal mood and affect.  behavior is normal.     Lab Results Recent Labs    02/10/18 0549 02/11/18 0144  WBC 4.4  --   HGB 13.0  --   HCT 38.1  --   NA 132* 136  K 3.9 3.4*  CL 102 101  CO2 19* 26  BUN <5* <5*  CREATININE 0.38* 0.39*    Microbiology: Results for orders placed or performed during the hospital encounter of  02/08/18  Culture, blood (routine x 2)     Status: Abnormal   Collection Time: 02/08/18  1:10 AM  Result Value Ref Range Status   Specimen Description   Final    BLOOD BLOOD RIGHT FOREARM Performed at [facility], [address], [place], [place] [address]    Special Requests   Final    BOTTLES DRAWN AEROBIC AND ANAEROBIC Blood Culture adequate volume Performed at [facility], [address], [place], [place] [address]    Culture  Setup Time   Final    GRAM NEGATIVE RODS IN BOTH AEROBIC AND ANAEROBIC BOTTLES CRITICAL RESULT CALLED TO, READ BACK BY AND VERIFIED WITH: [place] AT 1302 02/08/18 SDR Performed at [facility], [place] [address] [place], [place] [address]    Culture ESCHERICHIA COLI (A)  Final   Report Status 02/10/2018 FINAL  Final   Organism ID, Bacteria ESCHERICHIA COLI  Final      Susceptibility   Escherichia coli - MIC*    AMPICILLIN >=32 RESISTANT Resistant     CEFAZOLIN <=4 SENSITIVE Sensitive     CEFEPIME <=1 SENSITIVE Sensitive     CEFTAZIDIME <=1 SENSITIVE Sensitive     CEFTRIAXONE <=1 SENSITIVE Sensitive     CIPROFLOXACIN <=0.25 SENSITIVE Sensitive     GENTAMICIN <=1 SENSITIVE Sensitive     IMIPENEM <=0.25 SENSITIVE Sensitive     TRIMETH/SULFA >=320 RESISTANT Resistant     AMPICILLIN/SULBACTAM 4 SENSITIVE Sensitive     PIP/TAZO <=4 SENSITIVE Sensitive     Extended ESBL NEGATIVE Sensitive     * ESCHERICHIA COLI  Culture, blood (routine x 2)     Status: Abnormal   Collection Time: 02/08/18  1:10 AM  Result Value Ref Range Status   Specimen Description   Final    BLOOD BLOOD RIGHT HAND Performed at [facility], [address], [place], [place] [address]    Special Requests   Final    BOTTLES DRAWN AEROBIC AND ANAEROBIC Blood Culture adequate volume Performed at [facility], [address] [place], [place] [address]    Culture  Setup Time   Final    GRAM NEGATIVE RODS IN BOTH AEROBIC AND ANAEROBIC  BOTTLES CRITICAL VALUE NOTED.  VALUE IS CONSISTENT WITH PREVIOUSLY REPORTED AND CALLED VALUE. Performed at [facility], [address] [place], [place] [address]    Culture (A)  Final    ESCHERICHIA COLI SUSCEPTIBILITIES PERFORMED ON PREVIOUS CULTURE WITHIN THE LAST 5 DAYS. Performed at [facility], [place] [address] [place], [place] [address]    Report Status 02/10/2018 FINAL  Final  Blood Culture ID Panel (Reflexed)     Status: Abnormal   Collection Time: 02/08/18  1:10 AM  Result Value Ref Range Status   Enterococcus species NOT DETECTED NOT DETECTED Final   Listeria monocytogenes NOT DETECTED NOT DETECTED Final   Staphylococcus species NOT DETECTED NOT DETECTED Final   Staphylococcus aureus NOT DETECTED NOT DETECTED Final   Streptococcus species NOT DETECTED NOT DETECTED Final   Streptococcus agalactiae NOT DETECTED NOT DETECTED Final   Streptococcus pneumoniae NOT DETECTED NOT DETECTED Final   Streptococcus pyogenes NOT DETECTED NOT DETECTED Final   Acinetobacter baumannii NOT DETECTED NOT DETECTED Final   Enterobacteriaceae species DETECTED (A) NOT DETECTED Final    Comment: Enterobacteriaceae represent a large family of gram-negative bacteria, not a single organism. CRITICAL RESULT CALLED TO, READ BACK BY AND VERIFIED WITH: NATE COOKSON 02/08/18 @ 1302  SDR/MLK    Enterobacter cloacae complex NOT DETECTED NOT DETECTED Final   Escherichia coli DETECTED (A) NOT DETECTED Final    Comment: CRITICAL RESULT CALLED TO, READ BACK BY AND VERIFIED WITH: NATE COOKSON 02/08/18 @ 1302  SDR/MLK    Klebsiella oxytoca NOT DETECTED NOT DETECTED Final   Klebsiella pneumoniae NOT DETECTED NOT DETECTED Final   Proteus species NOT DETECTED NOT DETECTED Final   Serratia marcescens NOT DETECTED NOT DETECTED Final   Carbapenem resistance NOT DETECTED NOT DETECTED Final   Haemophilus influenzae NOT DETECTED NOT DETECTED Final   Neisseria meningitidis NOT DETECTED NOT DETECTED Final    Pseudomonas aeruginosa NOT DETECTED NOT DETECTED Final   Candida albicans NOT DETECTED NOT DETECTED Final   Candida glabrata NOT DETECTED NOT DETECTED Final   Candida krusei NOT DETECTED NOT DETECTED Final   Candida parapsilosis NOT DETECTED NOT DETECTED Final   Candida tropicalis NOT DETECTED NOT DETECTED Final    Comment: Performed at [facility], [address] [place], [place] [address]  MRSA PCR Screening     Status: None   Collection Time: 02/08/18  6:23 AM  Result Value Ref Range Status   MRSA by PCR NEGATIVE NEGATIVE Final    Comment:        The GeneXpert MRSA Assay (FDA approved for NASAL specimens only), is one component of a comprehensive MRSA colonization surveillance program. It is not intended to diagnose MRSA infection nor to guide or monitor treatment for MRSA infections. Performed at [facility], [address], [place], [place] [address]     Studies/Results: No results found.  Assessment/Plan: Leverne Tessler is a 32 y.o. female with etoh abuse admitted with R fank pain and fevers. Found to have E coli UTI and bacteremia with pyelo on CT scan. Possible aspiration as well.  Recommendations Pyelonephritis - E coli sepsis.  Can dc when stable on keflex for 21 days total of abx since admission   Possible aspiration - cont unasyn while inpatient then transition to oral keflex,   Thank you very much for the consult. Will follow with you.  Leonel Ramsay   02/12/2018, 2:56 PM

## 2018-02-12 NOTE — Progress Notes (Signed)
Heart rate  Currently 108 but CCMD called earlier to say it was around 130's.  Patient was moving around at the time.  Dr Bridgett Larsson notified of this

## 2018-02-12 NOTE — Progress Notes (Signed)
[place] [facility] [place] NAME: Kristine Macias    MR#:  [id number]  DATE OF BIRTH:  1985-10-08  SUBJECTIVE:  CHIEF COMPLAINT:   Chief Complaint  Patient presents with  . Flank Pain   Better shortness of breath, on oxygen by nasal cannula 1 L.  REVIEW OF SYSTEMS:  CONSTITUTIONAL: No fever, fatigue or weakness.  EYES: No blurred or double vision.  EARS, NOSE, AND THROAT: No tinnitus or ear pain.  RESPIRATORY: No cough, has shortness of breath, no wheezing or hemoptysis.  CARDIOVASCULAR: No chest pain, orthopnea, edema.  GASTROINTESTINAL: No nausea, vomiting, diarrhea or abdominal pain.  GENITOURINARY: No dysuria, hematuria.  ENDOCRINE: No polyuria, nocturia,  HEMATOLOGY: No anemia, easy bruising or bleeding SKIN: No rash or lesion. MUSCULOSKELETAL: No joint pain or arthritis.   NEUROLOGIC: No tingling, numbness, weakness.  PSYCHIATRY: No anxiety or depression.   ROS  DRUG ALLERGIES:   Allergies  Allergen Reactions  . Aspirin Shortness Of Breath  . Effexor [Venlafaxine] Anaphylaxis  . Gabapentin Anaphylaxis  . Libritabs [Chlordiazepoxide] Anaphylaxis    Tolerates lorazepam and clonazepam  . Sulfa Antibiotics Anaphylaxis  . Tetracyclines & Related Anaphylaxis  . Trazodone And Nefazodone Anaphylaxis  . Latex Hives and Itching  . Paxil [Paroxetine] Hives and Itching  . Seroquel [Quetiapine Fumarate] Hives and Itching  . Zoloft [Sertraline Hcl] Hives and Itching    VITALS:  Blood pressure 100/73, pulse (!) 102, temperature 98.6 F (37 C), temperature source Oral, resp. rate 17, height 5' 6"  (1.676 m), weight 130 lb 8.2 oz (59.2 kg), last menstrual period 01/24/2018, SpO2 98 %.  PHYSICAL EXAMINATION:  GENERAL:  32 y.o.-year-old patient lying in the bed with no acute distress.  EYES: Pupils equal, round, reactive to light and accommodation. No scleral icterus. Extraocular muscles intact.  HEENT: Head atraumatic, normocephalic.  Oropharynx and nasopharynx clear.  NECK:  Supple, no jugular venous distention. No thyroid enlargement, no tenderness.  LUNGS: Normal breath sounds bilaterally, no wheezing, rales,rhonchi or crepitation. No use of accessory muscles of respiration.  CARDIOVASCULAR: S1, S2 normal. No murmurs, rubs, or gallops.  ABDOMEN: Soft, nontender, nondistended. Bowel sounds present. No organomegaly or mass.  EXTREMITIES: No pedal edema, cyanosis, or clubbing.  NEUROLOGIC: Cranial nerves II through XII are intact. Muscle strength 5/5 in all extremities. Sensation intact. Gait not checked.  PSYCHIATRIC: The patient is alert and oriented x 3.  SKIN: No obvious rash, lesion, or ulcer.   Physical Exam LABORATORY PANEL:   CBC Recent Labs  Lab 02/10/18 0549  WBC 4.4  HGB 13.0  HCT 38.1  PLT 136*   ------------------------------------------------------------------------------------------------------------------  Chemistries  Recent Labs  Lab 02/08/18 0002  02/11/18 0144 02/12/18 0526  NA 126*   < > 136  --   K 2.5*   < > 3.4*  --   CL 86*   < > 101  --   CO2 22   < > 26  --   GLUCOSE 124*   < > 140*  --   BUN 7   < > <5*  --   CREATININE 0.53   < > 0.39*  --   CALCIUM 9.1   < > 8.2*  --   MG  --    < > 1.8 1.6*  AST 110*  --   --   --   ALT 79*  --   --   --   ALKPHOS 102  --   --   --  BILITOT 0.7  --   --   --    < > = values in this interval not displayed.   ------------------------------------------------------------------------------------------------------------------  Cardiac Enzymes No results for input(s): TROPONINI in the last 168 hours. ------------------------------------------------------------------------------------------------------------------  RADIOLOGY:  No results found.  ASSESSMENT AND PLAN:  This is a 32 year old female admitted for pyelonephritis  *Acute Enterobacter ACA species/E. coli sepsis secondary to acute right pyelonephritis Resolving slowly She  was treated initially with empiric meropenem-changed to IV Unasyn based on blood culture and urine culture sensitivities.   *Acute right E. coli pyelonephritis Resolving Continue adult pain protocol and all other plans as stated above   *Acute hyponatremia/hypochloremia/hypokalemia/hypocalcemia  Improving with IV fluids, p.o. calcium twice daily.  Hypomagnesemia.  Given magnesium IV.  *Chronic alcoholic hepatitis with elevated liver function tests  Alcohol withdrawal protocol while in house   *Acute on COPD exacerbation Stable Inhaled corticosteroids twice daily, scheduled breathing treatments, check two-view chest x-ray, mucolytic agents, supplemental oxygen wean as tolerated, patient advised to quit smoking  Acute respiratory failure with hypoxia due to right-sided possible pneumonia with pleural effusion. Changed antibiotic arrangement to Unasyn/azithromycin. Try to wean off oxygen, DuoNeb every 4 hours.  *Chronic tobacco smoking abuse/dependency Smoking cessation was counseled for 3 minutes.  DVT prophylaxis with Lovenox subcu   All the records are reviewed and case discussed with Care Management/Social Workerr. Management plans discussed with the patient, family and they are in agreement.  CODE STATUS: full TOTAL TIME TAKING CARE OF THIS PATIENT: 33 minutes.  POSSIBLE D/C IN 1-2 DAYS, DEPENDING ON CLINICAL CONDITION.   Demetrios Loll M.D on 02/12/2018   Between 7am to 6pm - Pager - [phone]  After 6pm go to [email or web address] - password [email or web address] [facility]  Office  [phone]  CC: Primary care physician; Patient, No Pcp Per  Note: This dictation was prepared with Dragon dictation along with smaller phrase technology. Any transcriptional errors that result from this process are unintentional.

## 2018-02-12 NOTE — Progress Notes (Signed)
Pt scored a 9 on the RT protocol assessment. She is not wheezing. The nebulizer treatments are changing to Q6.

## 2018-02-13 LAB — BASIC METABOLIC PANEL
Anion gap: 7 (ref 5–15)
BUN: 5 mg/dL — AB (ref 6–20)
CALCIUM: 9 mg/dL (ref 8.9–10.3)
CHLORIDE: 108 mmol/L (ref 101–111)
CO2: 22 mmol/L (ref 22–32)
CREATININE: 0.32 mg/dL — AB (ref 0.44–1.00)
GFR calc Af Amer: >60 mL/min (ref >60)
GFR calc non Af Amer: >60 mL/min (ref >60)
GLUCOSE: 115 mg/dL — AB (ref 65–99)
Potassium: 3.9 mmol/L (ref 3.5–5.1)
Sodium: 137 mmol/L (ref 135–145)

## 2018-02-13 LAB — MAGNESIUM: Magnesium: 1.9 mg/dL (ref 1.7–2.4)

## 2018-02-13 MED ORDER — CEPHALEXIN 500 MG PO CAPS: 500.0000 mg | ORAL_CAPSULE | Freq: Three times a day (TID) | ORAL | 0 refills | Status: DC

## 2018-02-13 MED ORDER — CEPHALEXIN 500 MG PO CAPS
500.0000 mg | ORAL_CAPSULE | Freq: Three times a day (TID) | ORAL | Status: DC
  Administered 2018-02-13 (×2): 500 mg via ORAL
  Filled 2018-02-13 (×2): qty 1

## 2018-02-13 MED ORDER — ALBUTEROL SULFATE HFA 108 (90 BASE) MCG/ACT IN AERS: 2.0000 | INHALATION_SPRAY | Freq: Four times a day (QID) | RESPIRATORY_TRACT | 2 refills | Status: AC | PRN

## 2018-02-13 MED ORDER — IPRATROPIUM-ALBUTEROL 0.5-2.5 (3) MG/3ML IN SOLN: 3.0000 mL | Freq: Two times a day (BID) | RESPIRATORY_TRACT | Status: DC

## 2018-02-13 NOTE — Care Management Note (Addendum)
Case Management Note  Patient Details  Name: Ryker Pherigo MRN: [id number] Date of Birth: Feb 13, 1986   Patient to discharge home today.  RN has ambulated patient and she was able to maintain her sats on RA.  Patient states she lives with "someone" relies on them for transportation.  Patient states she is not employed, gets food stamps.  Patient is uninsured, and does not have a PCP.  Patient provided coupons from [email or web address].  Albuterol $23.26, and keflex $13.68.  Patient states if I don't have the money today I will be able to get them tomorrow.  RNCM offered to fax prescriptions to [facility]  And they would be no cost to patient.  Patient declined for them to be sent, and states she prefers to get them at [facility].  Patient provided applications to [facility]  And [facility] . RNCM signing off    Update : 1104 am.  Nursing notified RNCM that patient has changed her mind and would like prescriptions to be sent to [facility] .  Prescriptions have been faxed  Subjective/Objective:                    Action/Plan:   Expected Discharge Date:  02/13/18               Expected Discharge Plan:  Home/Self Care  In-House Referral:     Discharge planning Services  CM Consult, [facility], Medication Assistance  Post Acute Care Choice:    Choice offered to:     DME Arranged:    DME Agency:     HH Arranged:    HH Agency:     Status of Service:  Completed, signed off  If discussed at [employer], dates discussed:    Additional Comments:  Beverly Sessions, RN 02/13/2018, 10:03 AM

## 2018-02-13 NOTE — Discharge Summary (Signed)
[facility] [place] NAME: Kristine Macias    MR#:  [id number]  DATE OF BIRTH:  [birth year]  DATE OF ADMISSION:  02/08/2018   ADMITTING PHYSICIAN: Harrie Foreman, MD  DATE OF DISCHARGE:  02/13/2018  PRIMARY CARE PHYSICIAN: Patient, No Pcp Per   ADMISSION DIAGNOSIS:  Hypokalemia [E87.6] Lower urinary tract infectious disease [N39.0] Hyponatremia [E87.1] Pyelonephritis [N12] Right flank pain [R10.9] Sepsis, due to unspecified organism ([place]) [A41.9] DISCHARGE DIAGNOSIS:  Active Problems:   Pyelonephritis  SECONDARY DIAGNOSIS:   Past Medical History:  Diagnosis Date  . Alcohol abuse 11/2014   drinking since age 6. chronic, recurrent. at least 2 detox admits before 2015.   Marland Kitchen Alcoholic hepatitis 03/4695   hepatic steatosis on 11/2014 ultrasound.   . Breast cancer [facility])    age 16, lump removed from left breast  . Chlamydia 2007   bacterial vaginosis 09/2011  . Colitis 12/2014   colonoscopy for diarrhea and abnml CT 01/06/15: erythmatous TI (path: active ileitis, ? emerging IBD), rectal erythema (path: mucosal prolapse). Random bx of normal colon (path unremarkable)   . Congenital deafness    left ear only.  Also noted in mother and sibling (unilateral).   . Depression with anxiety initally at age 87  . Failure to thrive in adult 12/2014.    Malnutrition: n/v, not eating, weight loss, BMI 14.  s/p 01/11/2015 PEG (Dr Dorna Leitz).   . Hypertensive urgency   . Irregular heart beat 2010   wt/diet related after evaluation  . Pancreas divisum 02/2015   Type 1 seen on CT.   Marland Kitchen Pneumothorax, spontaneous, tension   . Renal disorder   . Seizure ([place]) 06/2015   due to ETOH/benzo withdrawal.   . Spontaneous pneumothorax 08/2012   right.  chest tube placed.   . Thrombocytopenia ([place]) 04/2007   HOSPITAL COURSE:   This is a 32 year old female admitted for pyelonephritis  *Acute Enterobacter ACA species/E. coli sepsis secondary to acute right  pyelonephritis Improved. She was treated initially with empiric meropenem-changed to IV Unasyn based on blood culture and urine culture sensitivities. keflex for 21 days totalof abx since admissionper Dr. Ola Spurr.    *Acute right E. coli pyelonephritis Improved.  *Acute hyponatremia/hypochloremia/hypokalemia/hypocalcemia  Improving with IV fluids, p.o. calcium twice daily.  Hypomagnesemia.  Given magnesium IV. Improved.  *Chronic alcoholic hepatitis with elevated liver function tests  Alcohol withdrawal protocol while in house   *Acute on COPD exacerbation Stable Inhaled corticosteroids twice daily, scheduled breathing treatments, check two-view chest x-ray, mucolytic agents, supplemental oxygen wean as tolerated, patient advised to quit smoking  Acute respiratory failure with hypoxia due to right-sided possible pneumonia with pleural effusion. Changed antibiotic arrangement to Unasyn/azithromycin. Weaned off oxygen, DuoNeb every 4 hours. Change to albuterol prn.  *Chronic tobacco smoking abuse/dependency Smoking cessation was counseled for 3 minutes.  DISCHARGE CONDITIONS:  Stable, discharge to home today. CONSULTS OBTAINED:  Treatment Team:  Leonel Ramsay, MD DRUG ALLERGIES:   Allergies  Allergen Reactions  . Aspirin Shortness Of Breath  . Effexor [Venlafaxine] Anaphylaxis  . Gabapentin Anaphylaxis  . Libritabs [Chlordiazepoxide] Anaphylaxis    Tolerates lorazepam and clonazepam  . Sulfa Antibiotics Anaphylaxis  . Tetracyclines & Related Anaphylaxis  . Trazodone And Nefazodone Anaphylaxis  . Latex Hives and Itching  . Paxil [Paroxetine] Hives and Itching  . Seroquel [Quetiapine Fumarate] Hives and Itching  . Zoloft [Sertraline Hcl] Hives and Itching   DISCHARGE MEDICATIONS:   Allergies as of  02/13/2018      Reactions   Aspirin Shortness Of Breath   Effexor [venlafaxine] Anaphylaxis   Gabapentin Anaphylaxis   Libritabs [chlordiazepoxide]  Anaphylaxis   Tolerates lorazepam and clonazepam   Sulfa Antibiotics Anaphylaxis   Tetracyclines & Related Anaphylaxis   Trazodone And Nefazodone Anaphylaxis   Latex Hives, Itching   Paxil [paroxetine] Hives, Itching   Seroquel [quetiapine Fumarate] Hives, Itching   Zoloft [sertraline Hcl] Hives, Itching      Medication List    TAKE these medications   albuterol 108 (90 Base) MCG/ACT inhaler Commonly known as:  PROVENTIL HFA;VENTOLIN HFA Inhale 2 puffs into the lungs every 6 (six) hours as needed for wheezing or shortness of breath.   cephALEXin 500 MG capsule Commonly known as:  KEFLEX Take 1 capsule (500 mg total) by mouth every 8 (eight) hours.   Melatonin 5 MG Tabs Take 5 mg by mouth at bedtime as needed (sleep).        DISCHARGE INSTRUCTIONS:  See AVS. If you experience worsening of your admission symptoms, develop shortness of breath, life threatening emergency, suicidal or homicidal thoughts you must seek medical attention immediately by calling 911 or calling your MD immediately  if symptoms less severe.  You Must read complete instructions/literature along with all the possible adverse reactions/side effects for all the Medicines you take and that have been prescribed to you. Take any new Medicines after you have completely understood and accpet all the possible adverse reactions/side effects.   Please note  You were cared for by a hospitalist during your hospital stay. If you have any questions about your discharge medications or the care you received while you were in the hospital after you are discharged, you can call the unit and asked to speak with the hospitalist on call if the hospitalist that took care of you is not available. Once you are discharged, your primary care physician will handle any further medical issues. Please note that NO REFILLS for any discharge medications will be authorized once you are discharged, as it is imperative that you return to your  primary care physician (or establish a relationship with a primary care physician if you do not have one) for your aftercare needs so that they can reassess your need for medications and monitor your lab values.    On the day of Discharge:  VITAL SIGNS:  Blood pressure 97/70, pulse 99, temperature 98.1 F (36.7 C), temperature source Oral, resp. rate 16, height 5' 6"  (1.676 m), weight 120 lb 13 oz (54.8 kg), last menstrual period 01/24/2018, SpO2 94 %. PHYSICAL EXAMINATION:  GENERAL:  32 y.o.-year-old patient lying in the bed with no acute distress.  EYES: Pupils equal, round, reactive to light and accommodation. No scleral icterus. Extraocular muscles intact.  HEENT: Head atraumatic, normocephalic. Oropharynx and nasopharynx clear.  NECK:  Supple, no jugular venous distention. No thyroid enlargement, no tenderness.  LUNGS: Normal breath sounds bilaterally, no wheezing, rales,rhonchi or crepitation. No use of accessory muscles of respiration.  CARDIOVASCULAR: S1, S2 normal. No murmurs, rubs, or gallops.  ABDOMEN: Soft, non-tender, non-distended. Bowel sounds present. No organomegaly or mass.  EXTREMITIES: No pedal edema, cyanosis, or clubbing.  NEUROLOGIC: Cranial nerves II through XII are intact. Muscle strength 5/5 in all extremities. Sensation intact. Gait not checked.  PSYCHIATRIC: The patient is alert and oriented x 3.  SKIN: No obvious rash, lesion, or ulcer.  DATA REVIEW:   CBC Recent Labs  Lab 02/10/18 0549  WBC 4.4  HGB  13.0  HCT 38.1  PLT 136*    Chemistries  Recent Labs  Lab 02/08/18 0002  02/13/18 0518  NA 126*   < > 137  K 2.5*   < > 3.9  CL 86*   < > 108  CO2 22   < > 22  GLUCOSE 124*   < > 115*  BUN 7   < > 5*  CREATININE 0.53   < > 0.32*  CALCIUM 9.1   < > 9.0  MG  --    < > 1.9  AST 110*  --   --   ALT 79*  --   --   ALKPHOS 102  --   --   BILITOT 0.7  --   --    < > = values in this interval not displayed.     Microbiology Results  Results for  orders placed or performed during the hospital encounter of 02/08/18  Culture, blood (routine x 2)     Status: Abnormal   Collection Time: 02/08/18  1:10 AM  Result Value Ref Range Status   Specimen Description   Final    BLOOD BLOOD RIGHT FOREARM Performed at [facility], [address], [place], [place] [address]    Special Requests   Final    BOTTLES DRAWN AEROBIC AND ANAEROBIC Blood Culture adequate volume Performed at [facility], [address] [place], [place] [address]    Culture  Setup Time   Final    GRAM NEGATIVE RODS IN BOTH AEROBIC AND ANAEROBIC BOTTLES CRITICAL RESULT CALLED TO, READ BACK BY AND VERIFIED WITH: [place] AT 1245 02/08/18 SDR Performed at [facility], [place] [address] [place], [place] [address]    Culture ESCHERICHIA COLI (A)  Final   Report Status 02/10/2018 FINAL  Final   Organism ID, Bacteria ESCHERICHIA COLI  Final      Susceptibility   Escherichia coli - MIC*    AMPICILLIN >=32 RESISTANT Resistant     CEFAZOLIN <=4 SENSITIVE Sensitive     CEFEPIME <=1 SENSITIVE Sensitive     CEFTAZIDIME <=1 SENSITIVE Sensitive     CEFTRIAXONE <=1 SENSITIVE Sensitive     CIPROFLOXACIN <=0.25 SENSITIVE Sensitive     GENTAMICIN <=1 SENSITIVE Sensitive     IMIPENEM <=0.25 SENSITIVE Sensitive     TRIMETH/SULFA >=320 RESISTANT Resistant     AMPICILLIN/SULBACTAM 4 SENSITIVE Sensitive     PIP/TAZO <=4 SENSITIVE Sensitive     Extended ESBL NEGATIVE Sensitive     * ESCHERICHIA COLI  Culture, blood (routine x 2)     Status: Abnormal   Collection Time: 02/08/18  1:10 AM  Result Value Ref Range Status   Specimen Description   Final    BLOOD BLOOD RIGHT HAND Performed at [facility], [address], [place], [place] [address]    Special Requests   Final    BOTTLES DRAWN AEROBIC AND ANAEROBIC Blood Culture adequate volume Performed at [facility], [address] [place], [place] [address]    Culture  Setup Time    Final    GRAM NEGATIVE RODS IN BOTH AEROBIC AND ANAEROBIC BOTTLES CRITICAL VALUE NOTED.  VALUE IS CONSISTENT WITH PREVIOUSLY REPORTED AND CALLED VALUE. Performed at [facility] [address] [place], [place] [address]    Culture (A)  Final    ESCHERICHIA COLI SUSCEPTIBILITIES PERFORMED ON PREVIOUS CULTURE WITHIN THE LAST 5 DAYS. Performed at [facility], [place]  [address], [place], [place] [address]    Report Status 02/10/2018 FINAL  Final  Blood Culture ID Panel (Reflexed)     Status: Abnormal   Collection Time: 02/08/18  1:10 AM  Result Value Ref Range Status   Enterococcus species NOT DETECTED NOT DETECTED Final   Listeria monocytogenes NOT DETECTED NOT DETECTED Final   Staphylococcus species NOT DETECTED NOT DETECTED Final   Staphylococcus aureus NOT DETECTED NOT DETECTED Final   Streptococcus species NOT DETECTED NOT DETECTED Final   Streptococcus agalactiae NOT DETECTED NOT DETECTED Final   Streptococcus pneumoniae NOT DETECTED NOT DETECTED Final   Streptococcus pyogenes NOT DETECTED NOT DETECTED Final   Acinetobacter baumannii NOT DETECTED NOT DETECTED Final   Enterobacteriaceae species DETECTED (A) NOT DETECTED Final    Comment: Enterobacteriaceae represent a large family of gram-negative bacteria, not a single organism. CRITICAL RESULT CALLED TO, READ BACK BY AND VERIFIED WITH: NATE COOKSON 02/08/18 @ 1302  SDR/MLK    Enterobacter cloacae complex NOT DETECTED NOT DETECTED Final   Escherichia coli DETECTED (A) NOT DETECTED Final    Comment: CRITICAL RESULT CALLED TO, READ BACK BY AND VERIFIED WITH: NATE COOKSON 02/08/18 @ 1302  SDR/MLK    Klebsiella oxytoca NOT DETECTED NOT DETECTED Final   Klebsiella pneumoniae NOT DETECTED NOT DETECTED Final   Proteus species NOT DETECTED NOT DETECTED Final   Serratia marcescens NOT DETECTED NOT DETECTED Final   Carbapenem resistance NOT DETECTED NOT DETECTED Final   Haemophilus influenzae NOT DETECTED NOT DETECTED  Final   Neisseria meningitidis NOT DETECTED NOT DETECTED Final   Pseudomonas aeruginosa NOT DETECTED NOT DETECTED Final   Candida albicans NOT DETECTED NOT DETECTED Final   Candida glabrata NOT DETECTED NOT DETECTED Final   Candida krusei NOT DETECTED NOT DETECTED Final   Candida parapsilosis NOT DETECTED NOT DETECTED Final   Candida tropicalis NOT DETECTED NOT DETECTED Final    Comment: Performed at [facility], [address] [place], [place] [address]  MRSA PCR Screening     Status: None   Collection Time: 02/08/18  6:23 AM  Result Value Ref Range Status   MRSA by PCR NEGATIVE NEGATIVE Final    Comment:        The GeneXpert MRSA Assay (FDA approved for NASAL specimens only), is one component of a comprehensive MRSA colonization surveillance program. It is not intended to diagnose MRSA infection nor to guide or monitor treatment for MRSA infections. Performed at [facility], [address], [place], [place] [address]     RADIOLOGY:  No results found.   Management plans discussed with the patient, family and they are in agreement.  CODE STATUS: Full Code   TOTAL TIME TAKING CARE OF THIS PATIENT: 33 minutes.    Demetrios Loll M.D on 02/13/2018 at 12:47 PM  Between 7am to 6pm - Pager - [phone]  After 6pm go to [email or web address] - Proofreader  [facility] [place] [facility]  Office  [phone]  CC: Primary care physician; Patient, No Pcp Per   Note: This dictation was prepared with Dragon dictation along with smaller phrase technology. Any transcriptional errors that result from this process are unintentional.

## 2018-02-13 NOTE — Progress Notes (Signed)
Patient discharge teaching given, including activity, diet, follow-up appoints, and medications. Patient verbalized understanding of all discharge instructions. IV access was d/c'd. Vitals are stable. Skin is intact except as charted in most recent assessments. Pt to be escorted out by volunteer, to be driven home by family.  Malli Falotico [employer]

## 2019-09-26 ENCOUNTER — Inpatient Hospital Stay
Admission: EM | Admit: 2019-09-26 | Discharge: 2019-09-29 | DRG: 446 | Disposition: A | Discharge status: Home / Self Care | Payer: Self-pay | Attending: Internal Medicine | Admitting: Internal Medicine

## 2019-09-26 ENCOUNTER — Encounter: Payer: Self-pay | Admitting: Intensive Care

## 2019-09-26 ENCOUNTER — Emergency Department: Payer: Self-pay

## 2019-09-26 ENCOUNTER — Other Ambulatory Visit: Payer: Self-pay

## 2019-09-26 DIAGNOSIS — Z20822 Contact with and (suspected) exposure to covid-19: Secondary | ICD-10-CM | POA: Diagnosis present

## 2019-09-26 DIAGNOSIS — F1010 Alcohol abuse, uncomplicated: Secondary | ICD-10-CM | POA: Diagnosis present

## 2019-09-26 DIAGNOSIS — K8020 Calculus of gallbladder without cholecystitis without obstruction: Secondary | ICD-10-CM | POA: Diagnosis present

## 2019-09-26 DIAGNOSIS — K838 Other specified diseases of biliary tract: Secondary | ICD-10-CM

## 2019-09-26 DIAGNOSIS — K8050 Calculus of bile duct without cholangitis or cholecystitis without obstruction: Secondary | ICD-10-CM

## 2019-09-26 DIAGNOSIS — Z886 Allergy status to analgesic agent status: Secondary | ICD-10-CM

## 2019-09-26 DIAGNOSIS — F1020 Alcohol dependence, uncomplicated: Secondary | ICD-10-CM

## 2019-09-26 DIAGNOSIS — Z888 Allergy status to other drugs, medicaments and biological substances status: Secondary | ICD-10-CM

## 2019-09-26 DIAGNOSIS — R109 Unspecified abdominal pain: Secondary | ICD-10-CM

## 2019-09-26 DIAGNOSIS — H9042 Sensorineural hearing loss, unilateral, left ear, with unrestricted hearing on the contralateral side: Secondary | ICD-10-CM | POA: Diagnosis present

## 2019-09-26 DIAGNOSIS — F17290 Nicotine dependence, other tobacco product, uncomplicated: Secondary | ICD-10-CM | POA: Diagnosis present

## 2019-09-26 DIAGNOSIS — R1010 Upper abdominal pain, unspecified: Secondary | ICD-10-CM

## 2019-09-26 DIAGNOSIS — Z881 Allergy status to other antibiotic agents status: Secondary | ICD-10-CM

## 2019-09-26 DIAGNOSIS — Z91040 Latex allergy status: Secondary | ICD-10-CM

## 2019-09-26 DIAGNOSIS — Z882 Allergy status to sulfonamides status: Secondary | ICD-10-CM

## 2019-09-26 DIAGNOSIS — K7010 Alcoholic hepatitis without ascites: Secondary | ICD-10-CM | POA: Diagnosis present

## 2019-09-26 DIAGNOSIS — I214 Non-ST elevation (NSTEMI) myocardial infarction: Secondary | ICD-10-CM | POA: Diagnosis present

## 2019-09-26 DIAGNOSIS — K219 Gastro-esophageal reflux disease without esophagitis: Secondary | ICD-10-CM | POA: Diagnosis present

## 2019-09-26 DIAGNOSIS — K760 Fatty (change of) liver, not elsewhere classified: Secondary | ICD-10-CM | POA: Diagnosis present

## 2019-09-26 DIAGNOSIS — K831 Obstruction of bile duct: Principal | ICD-10-CM | POA: Diagnosis present

## 2019-09-26 DIAGNOSIS — R1011 Right upper quadrant pain: Secondary | ICD-10-CM

## 2019-09-26 DIAGNOSIS — Z8349 Family history of other endocrine, nutritional and metabolic diseases: Secondary | ICD-10-CM

## 2019-09-26 DIAGNOSIS — Z801 Family history of malignant neoplasm of trachea, bronchus and lung: Secondary | ICD-10-CM

## 2019-09-26 DIAGNOSIS — Z803 Family history of malignant neoplasm of breast: Secondary | ICD-10-CM

## 2019-09-26 HISTORY — DX: Crohn's disease, unspecified, without complications: K50.90

## 2019-09-26 LAB — URINALYSIS, COMPLETE (UACMP) WITH MICROSCOPIC
Bilirubin Urine: NEGATIVE
Glucose, UA: NEGATIVE mg/dL
Hgb urine dipstick: NEGATIVE
Ketones, ur: NEGATIVE mg/dL
Leukocytes,Ua: NEGATIVE
Nitrite: NEGATIVE
Protein, ur: NEGATIVE mg/dL
Specific Gravity, Urine: 1.014 (ref 1.005–1.030)
pH: 5 (ref 5.0–8.0)

## 2019-09-26 LAB — CBC
HCT: 39.3 % (ref 36.0–46.0)
Hemoglobin: 13.3 g/dL (ref 12.0–15.0)
MCH: 32.2 pg (ref 26.0–34.0)
MCHC: 33.8 g/dL (ref 30.0–36.0)
MCV: 95.2 fL (ref 80.0–100.0)
Platelets: 167 10*3/uL (ref 150–400)
RBC: 4.13 MIL/uL (ref 3.87–5.11)
RDW: 15.6 % — ABNORMAL HIGH (ref 11.5–15.5)
WBC: 6.9 10*3/uL (ref 4.0–10.5)
nRBC: 0 % (ref 0.0–0.2)

## 2019-09-26 LAB — COMPREHENSIVE METABOLIC PANEL
ALT: 76 U/L — ABNORMAL HIGH (ref 0–44)
AST: 163 U/L — ABNORMAL HIGH (ref 15–41)
Albumin: 3.1 g/dL — ABNORMAL LOW (ref 3.5–5.0)
Alkaline Phosphatase: 147 U/L — ABNORMAL HIGH (ref 38–126)
Anion gap: 11 (ref 5–15)
BUN: 11 mg/dL (ref 6–20)
CO2: 23 mmol/L (ref 22–32)
Calcium: 8.7 mg/dL — ABNORMAL LOW (ref 8.9–10.3)
Chloride: 101 mmol/L (ref 98–111)
Creatinine, Ser: 0.56 mg/dL (ref 0.44–1.00)
GFR calc Af Amer: >60 mL/min (ref >60)
GFR calc non Af Amer: >60 mL/min (ref >60)
Glucose, Bld: 136 mg/dL — ABNORMAL HIGH (ref 70–99)
Potassium: 3.5 mmol/L (ref 3.5–5.1)
Sodium: 135 mmol/L (ref 135–145)
Total Bilirubin: 1.5 mg/dL — ABNORMAL HIGH (ref 0.3–1.2)
Total Protein: 7.5 g/dL (ref 6.5–8.1)

## 2019-09-26 LAB — LIPASE, BLOOD: Lipase: 35 U/L (ref 11–51)

## 2019-09-26 LAB — POCT PREGNANCY, URINE: Preg Test, Ur: NEGATIVE

## 2019-09-26 MED ORDER — IOHEXOL 9 MG/ML PO SOLN: 500.0000 mL | Freq: Two times a day (BID) | ORAL | Status: DC | PRN

## 2019-09-26 MED ORDER — ONDANSETRON HCL 4 MG/2ML IJ SOLN
4.0000 mg | Freq: Once | INTRAMUSCULAR | Status: AC
  Administered 2019-09-26: 4 mg via INTRAVENOUS
  Filled 2019-09-26: qty 2

## 2019-09-26 MED ORDER — SODIUM CHLORIDE 0.9 % IV BOLUS
1000.0000 mL | Freq: Once | INTRAVENOUS | Status: AC
  Administered 2019-09-26: 1000 mL via INTRAVENOUS

## 2019-09-26 MED ORDER — IOHEXOL 300 MG/ML  SOLN
100.0000 mL | Freq: Once | INTRAMUSCULAR | Status: AC | PRN
  Administered 2019-09-26: 100 mL via INTRAVENOUS

## 2019-09-26 MED ORDER — MORPHINE SULFATE (PF) 4 MG/ML IV SOLN
4.0000 mg | Freq: Once | INTRAVENOUS | Status: AC
  Administered 2019-09-26: 4 mg via INTRAVENOUS
  Filled 2019-09-26: qty 1

## 2019-09-26 MED ORDER — IOHEXOL 9 MG/ML PO SOLN
500.0000 mL | ORAL | Status: AC
  Administered 2019-09-26 (×2): 500 mL via ORAL

## 2019-09-26 NOTE — ED Notes (Signed)
Evaluated by Md. Awaiting CT abd, patient drinking po contrast. IV placed NS infusing, IV meds given as ordered.
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
## 2019-09-26 NOTE — ED Triage Notes (Signed)
Patient c/o right sided flank pain with nausea. Reports she feels bloated also. HX pancreatitis and chrohns disease.

## 2019-09-26 NOTE — ED Notes (Signed)
Completed po contrast awaiting ct

## 2019-09-26 NOTE — ED Provider Notes (Signed)
Patient seen in conjunction with physician assistant Betha Loa.  I have personally seen and evaluated the patient, CT has resulted showing ductal dilation around the gallbladder/liver concern for possible stone.  Also appears to show a hemorrhagic ovarian cyst.  Patient has tenderness more so in the right lower quadrant in the right upper quadrant on my exam, but states over the past week she has been experiencing pains and a fullness discomfort across the upper abdomen including the right upper quadrant.  Given these findings on CT imaging we will proceed with right upper quadrant ultrasound to further evaluate.  Patient does have elevated LFTs however has a history of alcoholism and chronically elevated LFTs.  Patient continues to drink alcohol.  I suspect the patient's discomfort is likely related to the hemorrhagic cyst found on CT imaging, however I believe we need to rule out cholecystitis/choledocholithiasis via ultrasound.  Patient agreeable to plan of care.  Patient care signed out to oncoming physician ultrasound pending.   Harvest Dark, MD 09/26/19 2215

## 2019-09-26 NOTE — ED Notes (Signed)
Off unit to ct

## 2019-09-26 NOTE — ED Notes (Signed)
Reports nausea and abd issues x 1 week, reports her abd is very bloated with a lot of pressure making it difficult for her to breath.

## 2019-09-27 ENCOUNTER — Emergency Department: Payer: Self-pay

## 2019-09-27 DIAGNOSIS — K8020 Calculus of gallbladder without cholecystitis without obstruction: Secondary | ICD-10-CM | POA: Diagnosis present

## 2019-09-27 DIAGNOSIS — K831 Obstruction of bile duct: Principal | ICD-10-CM

## 2019-09-27 DIAGNOSIS — R7989 Other specified abnormal findings of blood chemistry: Secondary | ICD-10-CM

## 2019-09-27 DIAGNOSIS — R1011 Right upper quadrant pain: Secondary | ICD-10-CM

## 2019-09-27 DIAGNOSIS — F1020 Alcohol dependence, uncomplicated: Secondary | ICD-10-CM

## 2019-09-27 DIAGNOSIS — I214 Non-ST elevation (NSTEMI) myocardial infarction: Secondary | ICD-10-CM | POA: Diagnosis present

## 2019-09-27 LAB — CBC
HCT: 36.7 % (ref 36.0–46.0)
Hemoglobin: 12.5 g/dL (ref 12.0–15.0)
MCH: 33.2 pg (ref 26.0–34.0)
MCHC: 34.1 g/dL (ref 30.0–36.0)
MCV: 97.6 fL (ref 80.0–100.0)
Platelets: 131 10*3/uL — ABNORMAL LOW (ref 150–400)
RBC: 3.76 MIL/uL — ABNORMAL LOW (ref 3.87–5.11)
RDW: 15.9 % — ABNORMAL HIGH (ref 11.5–15.5)
WBC: 8.6 10*3/uL (ref 4.0–10.5)
nRBC: 0 % (ref 0.0–0.2)

## 2019-09-27 LAB — COMPREHENSIVE METABOLIC PANEL
ALT: 74 U/L — ABNORMAL HIGH (ref 0–44)
AST: 173 U/L — ABNORMAL HIGH (ref 15–41)
Albumin: 3 g/dL — ABNORMAL LOW (ref 3.5–5.0)
Alkaline Phosphatase: 137 U/L — ABNORMAL HIGH (ref 38–126)
Anion gap: 11 (ref 5–15)
BUN: 10 mg/dL (ref 6–20)
CO2: 22 mmol/L (ref 22–32)
Calcium: 8.2 mg/dL — ABNORMAL LOW (ref 8.9–10.3)
Chloride: 100 mmol/L (ref 98–111)
Creatinine, Ser: 0.52 mg/dL (ref 0.44–1.00)
GFR calc Af Amer: >60 mL/min (ref >60)
GFR calc non Af Amer: >60 mL/min (ref >60)
Glucose, Bld: 85 mg/dL (ref 70–99)
Potassium: 3.8 mmol/L (ref 3.5–5.1)
Sodium: 133 mmol/L — ABNORMAL LOW (ref 135–145)
Total Bilirubin: 2.2 mg/dL — ABNORMAL HIGH (ref 0.3–1.2)
Total Protein: 6.7 g/dL (ref 6.5–8.1)

## 2019-09-27 LAB — RESPIRATORY PANEL BY RT PCR (FLU A&B, COVID)
Influenza A by PCR: NEGATIVE
Influenza B by PCR: NEGATIVE
SARS Coronavirus 2 by RT PCR: NEGATIVE

## 2019-09-27 LAB — HIV ANTIBODY (ROUTINE TESTING W REFLEX): HIV Screen 4th Generation wRfx: NONREACTIVE

## 2019-09-27 LAB — APTT: aPTT: 25 seconds (ref 24–36)

## 2019-09-27 LAB — PROTIME-INR
INR: 1.1 (ref 0.8–1.2)
Prothrombin Time: 14 seconds (ref 11.4–15.2)

## 2019-09-27 MED ORDER — LORAZEPAM 2 MG/ML IJ SOLN
0.0000 mg | Freq: Four times a day (QID) | INTRAMUSCULAR | Status: DC
  Administered 2019-09-27: 4 mg via INTRAVENOUS
  Administered 2019-09-27: 1 mg via INTRAVENOUS
  Administered 2019-09-27: 3 mg via INTRAVENOUS
  Filled 2019-09-27: qty 1
  Filled 2019-09-27 (×2): qty 2

## 2019-09-27 MED ORDER — SODIUM CHLORIDE 0.9 % IV SOLN: INTRAVENOUS | Status: DC

## 2019-09-27 MED ORDER — THIAMINE HCL 100 MG/ML IJ SOLN
Freq: Once | INTRAVENOUS | Status: DC
  Filled 2019-09-27: qty 1000

## 2019-09-27 MED ORDER — THIAMINE HCL 100 MG/ML IJ SOLN
Freq: Once | INTRAVENOUS | Status: AC
  Filled 2019-09-27: qty 1000

## 2019-09-27 MED ORDER — MORPHINE SULFATE (PF) 4 MG/ML IV SOLN
4.0000 mg | INTRAVENOUS | Status: DC | PRN
  Administered 2019-09-27: 4 mg via INTRAVENOUS
  Filled 2019-09-27: qty 1

## 2019-09-27 MED ORDER — THIAMINE HCL 100 MG/ML IJ SOLN
100.0000 mg | Freq: Every day | INTRAMUSCULAR | Status: DC
  Administered 2019-09-27: 100 mg via INTRAVENOUS
  Filled 2019-09-27: qty 2

## 2019-09-27 MED ORDER — MELATONIN 5 MG PO TABS: 5.0000 mg | ORAL_TABLET | Freq: Every evening | ORAL | Status: DC | PRN

## 2019-09-27 MED ORDER — MORPHINE SULFATE (PF) 2 MG/ML IV SOLN
2.0000 mg | INTRAVENOUS | Status: DC | PRN
  Administered 2019-09-27: 2 mg via INTRAVENOUS
  Filled 2019-09-27: qty 1

## 2019-09-27 MED ORDER — MORPHINE SULFATE (PF) 2 MG/ML IV SOLN
1.0000 mg | INTRAVENOUS | Status: DC | PRN
  Administered 2019-09-27: 2 mg via INTRAVENOUS
  Administered 2019-09-27: 1 mg via INTRAVENOUS
  Administered 2019-09-28 (×3): 2 mg via INTRAVENOUS
  Filled 2019-09-27 (×5): qty 1

## 2019-09-27 MED ORDER — ACETAMINOPHEN 325 MG PO TABS: 650.0000 mg | ORAL_TABLET | Freq: Four times a day (QID) | ORAL | Status: DC | PRN

## 2019-09-27 MED ORDER — POTASSIUM CHLORIDE 20 MEQ PO PACK: 40.0000 meq | PACK | Freq: Once | ORAL | Status: DC

## 2019-09-27 MED ORDER — LORAZEPAM 2 MG/ML IJ SOLN
0.5000 mg | Freq: Once | INTRAMUSCULAR | Status: AC
  Administered 2019-09-27: 0.5 mg via INTRAVENOUS
  Filled 2019-09-27: qty 1

## 2019-09-27 MED ORDER — MAGNESIUM HYDROXIDE 400 MG/5ML PO SUSP: 30.0000 mL | Freq: Every day | ORAL | Status: DC | PRN

## 2019-09-27 MED ORDER — ONDANSETRON HCL 4 MG/2ML IJ SOLN
4.0000 mg | Freq: Four times a day (QID) | INTRAMUSCULAR | Status: DC | PRN
  Administered 2019-09-28 (×2): 4 mg via INTRAVENOUS
  Filled 2019-09-27 (×2): qty 2

## 2019-09-27 MED ORDER — ONDANSETRON HCL 4 MG PO TABS: 4.0000 mg | ORAL_TABLET | Freq: Four times a day (QID) | ORAL | Status: DC | PRN

## 2019-09-27 MED ORDER — GADOBUTROL 1 MMOL/ML IV SOLN
6.0000 mL | Freq: Once | INTRAVENOUS | Status: AC | PRN
  Administered 2019-09-27: 7.5 mL via INTRAVENOUS

## 2019-09-27 MED ORDER — ZOLPIDEM TARTRATE 5 MG PO TABS: 5.0000 mg | ORAL_TABLET | Freq: Every evening | ORAL | Status: DC | PRN

## 2019-09-27 MED ORDER — ACETAMINOPHEN 650 MG RE SUPP: 650.0000 mg | Freq: Four times a day (QID) | RECTAL | Status: DC | PRN

## 2019-09-27 MED ORDER — PANTOPRAZOLE SODIUM 40 MG IV SOLR
40.0000 mg | INTRAVENOUS | Status: DC
  Administered 2019-09-27 – 2019-09-29 (×3): 40 mg via INTRAVENOUS
  Filled 2019-09-27 (×3): qty 40

## 2019-09-27 NOTE — Progress Notes (Signed)
Order received from Dr Posey Pronto to discontinue telemetry order

## 2019-09-27 NOTE — Progress Notes (Signed)
[facility] NAME: Kristine Macias    MR#:  [id number]  DATE OF BIRTH:  [birth year]  SUBJECTIVE:   Patient came in the emergency room with abdominal pain right-sided. No fever nausea vomiting REVIEW OF SYSTEMS:   Review of Systems  Constitutional: Negative for chills, fever and weight loss.  HENT: Negative for ear discharge, ear pain and nosebleeds.   Eyes: Negative for blurred vision, pain and discharge.  Respiratory: Negative for sputum production, shortness of breath, wheezing and stridor.   Cardiovascular: Negative for chest pain, palpitations, orthopnea and PND.  Gastrointestinal: Positive for abdominal pain. Negative for diarrhea, nausea and vomiting.  Genitourinary: Negative for frequency and urgency.  Musculoskeletal: Negative for back pain and joint pain.  Neurological: Negative for sensory change, speech change, focal weakness and weakness.  Psychiatric/Behavioral: Negative for depression and hallucinations. The patient is not nervous/anxious.    Tolerating Diet:npo Tolerating PT: not needed  DRUG ALLERGIES:   Allergies  Allergen Reactions  . Aspirin Shortness Of Breath  . Effexor [Venlafaxine] Anaphylaxis  . Gabapentin Anaphylaxis  . Libritabs [Chlordiazepoxide] Anaphylaxis    Tolerates lorazepam and clonazepam  . Sulfa Antibiotics Anaphylaxis  . Tetracyclines & Related Anaphylaxis  . Trazodone And Nefazodone Anaphylaxis  . Latex Hives and Itching  . Paxil [Paroxetine] Hives and Itching  . Seroquel [Quetiapine Fumarate] Hives and Itching  . Zoloft [Sertraline Hcl] Hives and Itching    VITALS:  Blood pressure 100/62, pulse 82, temperature 99.1 F (37.3 C), temperature source Oral, resp. rate 16, height 5' 6"  (1.676 m), weight 64.4 kg, SpO2 96 %.  PHYSICAL EXAMINATION:   Physical Exam  GENERAL:  34 y.o.-year-old patient lying in the bed with no acute distress.  EYES: Pupils equal, round, reactive to light  and accommodation. No scleral icterus.   HEENT: Head atraumatic, normocephalic. Oropharynx and nasopharynx clear.  NECK:  Supple, no jugular venous distention. No thyroid enlargement, no tenderness.  LUNGS: Normal breath sounds bilaterally, no wheezing, rales, rhonchi. No use of accessory muscles of respiration.  CARDIOVASCULAR: S1, S2 normal. No murmurs, rubs, or gallops.  ABDOMEN: Soft, nontender, nondistended. Bowel sounds present. No organomegaly or mass.  EXTREMITIES: No cyanosis, clubbing or edema b/l.    NEUROLOGIC: Cranial nerves II through XII are intact. No focal Motor or sensory deficits b/l.   PSYCHIATRIC:  patient is alert and oriented x 3.  SKIN: No obvious rash, lesion, or ulcer.   LABORATORY PANEL:  CBC Recent Labs  Lab 09/27/19 0443  WBC 8.6  HGB 12.5  HCT 36.7  PLT 131*    Chemistries  Recent Labs  Lab 09/27/19 0443  NA 133*  K 3.8  CL 100  CO2 22  GLUCOSE 85  BUN 10  CREATININE 0.52  CALCIUM 8.2*  AST 173*  ALT 74*  ALKPHOS 137*  BILITOT 2.2*   Cardiac Enzymes No results for input(s): TROPONINI in the last 168 hours. RADIOLOGY:  CT ABDOMEN PELVIS W CONTRAST  Result Date: 09/26/2019 CLINICAL DATA:  Right upper quadrant pain, no fever or leukocytosis EXAM: CT ABDOMEN AND PELVIS WITH CONTRAST TECHNIQUE: Multidetector CT imaging of the abdomen and pelvis was performed using the standard protocol following bolus administration of intravenous contrast. CONTRAST:  184m OMNIPAQUE IOHEXOL 300 MG/ML  SOLN COMPARISON:  CT 02/08/2018 FINDINGS: Lower chest: Lung bases are clear. Normal heart size. No pericardial effusion. Hepatobiliary: Diffuse hepatic hypoattenuation compatible with hepatic steatosis. Subcentimeter hypoattenuating focus in the left lobe liver (2/16)  is too small to fully characterize on CT imaging but statistically likely benign. Mild gallbladder distention but without visible calcified gallstones. There is moderate extrahepatic biliary ductal  dilatation with only mild intrahepatic dilatation. Common bile duct measures up to 11 mm distally. No visible obstructing mass or calcified gallstones. No pericholecystic inflammation. Pancreas: Unremarkable. No pancreatic ductal dilatation or surrounding inflammatory changes. Spleen: Normal in size without focal abnormality. Adrenals/Urinary Tract: Adrenal glands are unremarkable. Kidneys are normal, without renal calculi, focal lesion, or hydronephrosis. Question some mild bladder wall thickening. No bladder debris, calculi or other abnormality. Stomach/Bowel: Distal esophagus, stomach and duodenal sweep are unremarkable. No small bowel wall thickening or dilatation. No evidence of obstruction. A normal appendix is visualized. No colonic dilatation or wall thickening. Vascular/Lymphatic: Atherosclerotic plaque within the normal caliber aorta. No suspicious or enlarged lymph nodes in the included lymphatic chains. Reproductive: Anteverted uterus. There is a 3.6 cm cystic structure in the right adnexa with some high attenuation material layering inferiorly and dependently within the collection could reflect a hemorrhagic cyst. No other adnexal abnormalities. Other: No free fluid. No free air. No bowel containing hernias. Mild diastasis recti. Scarring likely from prior port placement to the left of the umbilicus. Musculoskeletal: No acute osseous abnormality or suspicious osseous lesion. IMPRESSION: 1. Moderate extrahepatic biliary ductal dilatation with only mild intrahepatic dilatation. No visible obstructing mass or calcified gallstones. Correlate with LFTs and if there is concern for obstructive choledocholithiasis, consider MRCP. 2. Question some mild bladder wall thickening. Correlate with urinalysis to exclude cystitis. 3. Right adnexal 3.6 cm cystic structure with some high attenuation material layering inferiorly and dependently within the collection could reflect a hemorrhagic cyst which is a typically  benign finding in a reproductive age female. If further acute evaluation is felt to be clinically warranted pelvic ultrasound could be obtained at this time otherwise consider short-term 6-12 week follow-up to assess for resolution. This recommendation follows ACR consensus guidelines: White Paper of the ACR Incidental Findings Committee II on Adnexal Findings. J Am Coll Radiol [phone]. 4. Hepatic steatosis. Electronically Signed   By: Lovena Le M.D.   On: 09/26/2019 21:28   MR 3D Recon At Scanner  Result Date: 09/27/2019 CLINICAL DATA:  Right-sided abdominal pain. Alcohol abuse. Alcoholic hepatitis. Colitis. Crohn disease. EXAM: MRI ABDOMEN WITHOUT AND WITH CONTRAST (INCLUDING MRCP) TECHNIQUE: Multiplanar multisequence MR imaging of the abdomen was performed both before and after the administration of intravenous contrast. Heavily T2-weighted images of the biliary and pancreatic ducts were obtained, and three-dimensional MRCP images were rendered by post processing. CONTRAST:  7.88m GADAVIST GADOBUTROL 1 MMOL/ML IV SOLN COMPARISON:  Ultrasound 09/26/2019.  CT 09/26/2019 CT 08/08/2015. FINDINGS: Lower chest: Normal heart size without pericardial or pleural effusion. Hepatobiliary: Moderate hepatic steatosis. Lateral segment left liver lobe cyst. Borderline intrahepatic duct dilatation, similar. Mild gallbladder distension is similar to the 2016 exam. The cystic duct inserts low on the common duct. Both ducts are mildly dilated. For example, the common duct measures up to 12 mm on 42/22 versus maximally 9 mm on 08/08/2015 (when remeasured). Tapers relatively abruptly in the region of the ampulla, including on 18/3. No obstructive stone or mass. Pancreas: Pancreas divisum, as evidenced by a prominent dorsal duct entering the duodenum including on 23/10. No evidence of pancreatic duct dilatation or acute pancreatitis. Spleen:  Normal in size, without focal abnormality. Adrenals/Urinary Tract: Normal  adrenal glands. Normal kidneys, without hydronephrosis. Stomach/Bowel: Normal stomach, without wall thickening. Normal abdominal bowel loops. Vascular/Lymphatic: Aortic atherosclerosis.  Patent portal and hepatic veins. No abdominal adenopathy. Other:  No ascites. Musculoskeletal: No acute osseous abnormality. IMPRESSION: 1. Gallbladder and common duct dilatation, with relatively abrupt tapering in the region of the ampulla. This is progressive since the exam of 08/08/2015, but has a similar morphology to that study. Similarly morphology favors a benign etiology. Given elevated bilirubin, a stricture in the region of the ampulla cannot be entirely excluded. ERCP may be informative. 2. Hepatic steatosis. 3. Aortic Atherosclerosis (ICD10-I70.0). Significantly age advanced. 4. Pancreas divisum. Electronically Signed   By: Abigail Miyamoto M.D.   On: 09/27/2019 10:13   MR ABDOMEN MRCP W WO CONTAST  Result Date: 09/27/2019 CLINICAL DATA:  Right-sided abdominal pain. Alcohol abuse. Alcoholic hepatitis. Colitis. Crohn disease. EXAM: MRI ABDOMEN WITHOUT AND WITH CONTRAST (INCLUDING MRCP) TECHNIQUE: Multiplanar multisequence MR imaging of the abdomen was performed both before and after the administration of intravenous contrast. Heavily T2-weighted images of the biliary and pancreatic ducts were obtained, and three-dimensional MRCP images were rendered by post processing. CONTRAST:  7.12m GADAVIST GADOBUTROL 1 MMOL/ML IV SOLN COMPARISON:  Ultrasound 09/26/2019.  CT 09/26/2019 CT 08/08/2015. FINDINGS: Lower chest: Normal heart size without pericardial or pleural effusion. Hepatobiliary: Moderate hepatic steatosis. Lateral segment left liver lobe cyst. Borderline intrahepatic duct dilatation, similar. Mild gallbladder distension is similar to the 2016 exam. The cystic duct inserts low on the common duct. Both ducts are mildly dilated. For example, the common duct measures up to 12 mm on 42/22 versus maximally 9 mm on 08/08/2015  (when remeasured). Tapers relatively abruptly in the region of the ampulla, including on 18/3. No obstructive stone or mass. Pancreas: Pancreas divisum, as evidenced by a prominent dorsal duct entering the duodenum including on 23/10. No evidence of pancreatic duct dilatation or acute pancreatitis. Spleen:  Normal in size, without focal abnormality. Adrenals/Urinary Tract: Normal adrenal glands. Normal kidneys, without hydronephrosis. Stomach/Bowel: Normal stomach, without wall thickening. Normal abdominal bowel loops. Vascular/Lymphatic: Aortic atherosclerosis. Patent portal and hepatic veins. No abdominal adenopathy. Other:  No ascites. Musculoskeletal: No acute osseous abnormality. IMPRESSION: 1. Gallbladder and common duct dilatation, with relatively abrupt tapering in the region of the ampulla. This is progressive since the exam of 08/08/2015, but has a similar morphology to that study. Similarly morphology favors a benign etiology. Given elevated bilirubin, a stricture in the region of the ampulla cannot be entirely excluded. ERCP may be informative. 2. Hepatic steatosis. 3. Aortic Atherosclerosis (ICD10-I70.0). Significantly age advanced. 4. Pancreas divisum. Electronically Signed   By: KAbigail MiyamotoM.D.   On: 09/27/2019 10:13   [place]DOM[place] LIMITED RUQ  Result Date: 09/26/2019 CLINICAL DATA:  Right-sided abdominal pain EXAM: ULTRASOUND ABDOMEN LIMITED RIGHT UPPER QUADRANT COMPARISON:  CT on the same day FINDINGS: Gallbladder: The sonographic MPercell Millersign is negative. There is no gallbladder wall thickening. Gallbladder is distended. No gallstones are identified. There is no pericholecystic free fluid. Common bile duct: Diameter: Common bile duct is dilated measuring approximately 1.1 cm. Liver: Diffuse increased echogenicity with slightly heterogeneous liver. Appearance typically secondary to fatty infiltration. Fibrosis secondary consideration. No secondary findings of cirrhosis noted. No focal hepatic  lesion or intrahepatic biliary duct dilatation. Portal vein is patent on color Doppler imaging with normal direction of blood flow towards the liver. Other: None. IMPRESSION: 1. No gallstones identified. 2. Distended gallbladder without evidence for acute cholecystitis. 3. Dilated common bile duct measuring 1.1 cm. This can be further evaluated with MRCP/ERCP as clinically indicated. 4. Hepatic steatosis. Electronically Signed   By: CHarrell Gave  Green M.D.   On: 09/26/2019 22:49   ASSESSMENT AND PLAN:  Kristine Macias  is a 34 y.o. female with a known history of multiple medical problems presented to the emergency room with acute onset of right upper quadrant abdominal and right-sided pain with associated nausea without vomiting.  She denied any fever or chills.  1.  Hyperbilirubinemia/ mild obstructive jaundice  -  Pain management will be provided.  -  LFTs-- Alcoholic hepatitis pattern -US shows fatty liver  -GI consultation with Dr. Percell Macias ERCP to evaluate for ?biliary stricture -MRCP results noted -IVF  2. Alcohol abuse.  T - on as needed IV Ativan  -CIWA protocol -pt says she takes 1 can of beer daily. Did not report any other form of ETOH intake  3.  GERD.   -PPI therapy would be resumed.  4.  DVT prophylaxis.  - Subcutaneous Lovenox   Procedures: Family communication :patient Consults :GI Discharge Disposition :home CODE STATUS: FULL DVT Prophylaxis :SCD Barriers to discharge:GI procedure  TOTAL TIME TAKING CARE OF THIS PATIENT: *30* minutes.  >50% time spent on counselling and coordination of care  Note: This dictation was prepared with Dragon dictation along with smaller phrase technology. Any transcriptional errors that result from this process are unintentional.  Fritzi Mandes M.D    [facility]   CC: Primary care physician; Patient, No Pcp PerPatient ID: Kristine Macias, female   DOB: 06-08-1986, 34 y.o.   MRN: [id number]

## 2019-09-27 NOTE — H&P (Addendum)
[place] [facility] [place] NAME: Kristine Macias    MR#:  [id number]  DATE OF BIRTH:  June 15, 1986  DATE OF ADMISSION:  09/26/2019  PRIMARY CARE PHYSICIAN: Patient, No Pcp Per   REQUESTING/REFERRING PHYSICIAN: Lurline Hare, MD  CHIEF COMPLAINT:   Chief Complaint  Patient presents with  . Flank Pain    right    HISTORY OF PRESENT ILLNESS:  Kristine Macias  is a 34 y.o. female with a known history of multiple medical problems documented below, who presented to the emergency room with acute onset of right upper quadrant abdominal and right-sided pain with associated nausea without vomiting.  She denied any fever or chills.  She has been having chronic watery diarrhea for the last week.  She denied any recent intake of antibiotics.  No chest pain or dyspnea or cough or wheezing.  No headache or dizziness or blurred vision.  Upon presentation to the emergency room, vital signs were within normal.  Labs revealed potassium of 3.5 and LFTs showed elevated AST 163, ALT 76 and total bili was 1.5 with albumin of 3.1 and total protein 7.5.  CBC was within normal.  Urine pregnancy test was negative.  Influenza AMB antigens and COVID-19 PCR came back negative.  UA was unremarkable.  Abdominal pelvic CT scan showed moderate extrahepatic biliary ductal dilatation with only mild intrahepatic biliary dilatation with no visible obstructing mass or calcified gallstones with recommendation for MRCP.  It also showed 3.6 cm likely hemorrhagic cyst in the right ovary with recommendation for 6 to 12-week follow-up.  It showed hepatic steatosis.  Right upper quadrant ultrasound revealed distal 1.1 cm common bile dilatation with no gallstones.  It showed distended gallbladder though.  MRCP showed: 1. Gallbladder and common duct dilatation, with relatively abrupt tapering in the region of the ampulla. This is progressive since the exam of 08/08/2015, but has a similar morphology to that  study. Similarly morphology favors a benign etiology. Given elevated bilirubin, a stricture in the region of the ampulla cannot be entirely excluded. ERCP may be informative. 2. Hepatic steatosis. 3. Aortic Atherosclerosis (ICD10-I70.0). Significantly age advanced. 4. Pancreas divisum.  The patient was given 0.5 mg of IV Ativan and 4 mg of IV morphine sulfate and 4 mg of IV Zofran with 1 L bolus of IV normal saline as well as 100 mg of IV thiamine.  She was placed on CIWA protocol.  She will be admitted to a medical monitored bed for further evaluation and management.  PAST MEDICAL HISTORY:   Past Medical History:  Diagnosis Date  . Alcohol abuse 11/2014   drinking since age 41. chronic, recurrent. at least 2 detox admits before 2015.   Marland Kitchen Alcoholic hepatitis 01/8126   hepatic steatosis on 11/2014 ultrasound.   . Breast cancer [facility])    age 89, lump removed from left breast  . Chlamydia 2007   bacterial vaginosis 09/2011  . Colitis 12/2014   colonoscopy for diarrhea and abnml CT 01/06/15: erythmatous TI (path: active ileitis, ? emerging IBD), rectal erythema (path: mucosal prolapse). Random bx of normal colon (path unremarkable)   . Congenital deafness    left ear only.  Also noted in mother and sibling (unilateral).   . Crohn's disease ([employer])   . Depression with anxiety initally at age 16  . Failure to thrive in adult 12/2014.    Malnutrition: n/v, not eating, weight loss, BMI 14.  s/p 01/11/2015 PEG (Dr Dorna Leitz).   . Hypertensive urgency   .  Irregular heart beat 2010   wt/diet related after evaluation  . Pancreas divisum 02/2015   Type 1 seen on CT.   Marland Kitchen Pneumothorax, spontaneous, tension   . Renal disorder   . Seizure ([place]) 06/2015   due to ETOH/benzo withdrawal.   . Spontaneous pneumothorax 08/2012   right.  chest tube placed.   . Thrombocytopenia ([place]) 04/2007    PAST SURGICAL HISTORY:   Past Surgical History:  Procedure Laterality Date  . CESAREAN SECTION  07/2009    x 1.  G3,  Para 2012.   . CHEST TUBE INSERTION    . COLONOSCOPY WITH PROPOFOL N/A 01/06/2015   [facility], Dr Rayann Heman. colonoscopy for diarrhea and abnml CT 01/06/15: erythmatous TI (path: active ileitis, ? emerging IBD), rectal erythema (path: mucosal prolapse). Random bx of normal colon (path unremarkable)   . ESOPHAGOGASTRODUODENOSCOPY N/A 01/06/2015   ;[facility], Dr Rayann Heman. For wt loss, N/V: Normal study, duodenal biopsy/pathology: chronic active duodenitis.   Marland Kitchen ESOPHAGOGASTRODUODENOSCOPY N/A 01/11/2015   Rein-normal with PEG placement  . PEG PLACEMENT N/A 01/11/2015   [facility], Dr Rayann Heman.  for N/V/wt loss/severe malnutrition.     SOCIAL HISTORY:   Social History   Tobacco Use  . Smoking status: Current Every Day Smoker    Packs/day: 1.00    Years: 13.00    Pack years: 13.00    Types: E-cigarettes  . Smokeless tobacco: Never Used  Substance Use Topics  . Alcohol use: Yes    Alcohol/week: 36.0 standard drinks    Types: 36 Cans of beer per week    Comment: Binge Drink that usually last for 2 days.  Last drink: 24 hours ago.     FAMILY HISTORY:   Family History  Problem Relation Age of Onset  . Thyroid disease Mother   . Cancer Mother        breast cancer  . Cancer Father        lung cancer  . Stroke Other   . Crohn's disease Brother   . Cancer Maternal Grandmother        breast cancer  . Anesthesia problems Neg Hx     DRUG ALLERGIES:   Allergies  Allergen Reactions  . Aspirin Shortness Of Breath  . Effexor [Venlafaxine] Anaphylaxis  . Gabapentin Anaphylaxis  . Libritabs [Chlordiazepoxide] Anaphylaxis    Tolerates lorazepam and clonazepam  . Sulfa Antibiotics Anaphylaxis  . Tetracyclines & Related Anaphylaxis  . Trazodone And Nefazodone Anaphylaxis  . Latex Hives and Itching  . Paxil [Paroxetine] Hives and Itching  . Seroquel [Quetiapine Fumarate] Hives and Itching  . Zoloft [Sertraline Hcl] Hives and Itching    REVIEW OF SYSTEMS:   ROS As per history of present illness. All pertinent  systems were reviewed above. Constitutional,  HEENT, cardiovascular, respiratory, GI, GU, musculoskeletal, neuro, psychiatric, endocrine,  integumentary and hematologic systems were reviewed and are otherwise  negative/unremarkable except for positive findings mentioned above in the HPI.   MEDICATIONS AT HOME:   Prior to Admission medications   Medication Sig Start Date End Date Taking? Authorizing Provider  albuterol (PROVENTIL HFA;VENTOLIN HFA) 108 (90 Base) MCG/ACT inhaler Inhale 2 puffs into the lungs every 6 (six) hours as needed for wheezing or shortness of breath. Patient not taking: Reported on 09/27/2019 02/13/18   Demetrios Loll, MD  cephALEXin (KEFLEX) 500 MG capsule Take 1 capsule (500 mg total) by mouth every 8 (eight) hours. Patient not taking: Reported on 09/27/2019 02/13/18   Demetrios Loll, MD  Melatonin 5 MG TABS  Take 5 mg by mouth at bedtime as needed (sleep).    [provider]      VITAL SIGNS:  Blood pressure 116/76, pulse 85, temperature 98.9 F (37.2 C), temperature source Oral, resp. rate 20, height 5' 6"  (1.676 m), weight 64.4 kg, SpO2 95 %.  PHYSICAL EXAMINATION:  Physical Exam  GENERAL:  34 y.o.-year-old Caucasian female patient lying in the bed with no acute distress.  EYES: Pupils equal, round, reactive to light and accommodation. No scleral icterus. Extraocular muscles intact.  HEENT: Head atraumatic, normocephalic. Oropharynx and nasopharynx clear.  NECK:  Supple, no jugular venous distention. No thyroid enlargement, no tenderness.  LUNGS: Normal breath sounds bilaterally, no wheezing, rales,rhonchi or crepitation. No use of accessory muscles of respiration.  CARDIOVASCULAR: Regular rate and rhythm, S1, S2 normal. No murmurs, rubs, or gallops.  ABDOMEN: Soft, nondistended, with epigastric and right upper quadrant tenderness without rebound tenderness guarding or rigidity.  Bowel sounds present. No organomegaly or mass.  EXTREMITIES: No pedal edema,  cyanosis, or clubbing.  NEUROLOGIC: Cranial nerves II through XII are intact. Muscle strength 5/5 in all extremities. Sensation intact. Gait not checked.  PSYCHIATRIC: The patient is alert and oriented x 3.  Normal affect and good eye contact. SKIN: No obvious rash, lesion, or ulcer.   LABORATORY PANEL:   CBC Recent Labs  Lab 09/26/19 1541  WBC 6.9  HGB 13.3  HCT 39.3  PLT 167   ------------------------------------------------------------------------------------------------------------------  Chemistries  Recent Labs  Lab 09/26/19 1541  NA 135  K 3.5  CL 101  CO2 23  GLUCOSE 136*  BUN 11  CREATININE 0.56  CALCIUM 8.7*  AST 163*  ALT 76*  ALKPHOS 147*  BILITOT 1.5*   ------------------------------------------------------------------------------------------------------------------  Cardiac Enzymes No results for input(s): TROPONINI in the last 168 hours. ------------------------------------------------------------------------------------------------------------------  RADIOLOGY:  CT ABDOMEN PELVIS W CONTRAST  Result Date: 09/26/2019 CLINICAL DATA:  Right upper quadrant pain, no fever or leukocytosis EXAM: CT ABDOMEN AND PELVIS WITH CONTRAST TECHNIQUE: Multidetector CT imaging of the abdomen and pelvis was performed using the standard protocol following bolus administration of intravenous contrast. CONTRAST:  14m OMNIPAQUE IOHEXOL 300 MG/ML  SOLN COMPARISON:  CT 02/08/2018 FINDINGS: Lower chest: Lung bases are clear. Normal heart size. No pericardial effusion. Hepatobiliary: Diffuse hepatic hypoattenuation compatible with hepatic steatosis. Subcentimeter hypoattenuating focus in the left lobe liver (2/16) is too small to fully characterize on CT imaging but statistically likely benign. Mild gallbladder distention but without visible calcified gallstones. There is moderate extrahepatic biliary ductal dilatation with only mild intrahepatic dilatation. Common bile duct  measures up to 11 mm distally. No visible obstructing mass or calcified gallstones. No pericholecystic inflammation. Pancreas: Unremarkable. No pancreatic ductal dilatation or surrounding inflammatory changes. Spleen: Normal in size without focal abnormality. Adrenals/Urinary Tract: Adrenal glands are unremarkable. Kidneys are normal, without renal calculi, focal lesion, or hydronephrosis. Question some mild bladder wall thickening. No bladder debris, calculi or other abnormality. Stomach/Bowel: Distal esophagus, stomach and duodenal sweep are unremarkable. No small bowel wall thickening or dilatation. No evidence of obstruction. A normal appendix is visualized. No colonic dilatation or wall thickening. Vascular/Lymphatic: Atherosclerotic plaque within the normal caliber aorta. No suspicious or enlarged lymph nodes in the included lymphatic chains. Reproductive: Anteverted uterus. There is a 3.6 cm cystic structure in the right adnexa with some high attenuation material layering inferiorly and dependently within the collection could reflect a hemorrhagic cyst. No other adnexal abnormalities. Other: No free fluid. No free air. No bowel containing hernias.  Mild diastasis recti. Scarring likely from prior port placement to the left of the umbilicus. Musculoskeletal: No acute osseous abnormality or suspicious osseous lesion. IMPRESSION: 1. Moderate extrahepatic biliary ductal dilatation with only mild intrahepatic dilatation. No visible obstructing mass or calcified gallstones. Correlate with LFTs and if there is concern for obstructive choledocholithiasis, consider MRCP. 2. Question some mild bladder wall thickening. Correlate with urinalysis to exclude cystitis. 3. Right adnexal 3.6 cm cystic structure with some high attenuation material layering inferiorly and dependently within the collection could reflect a hemorrhagic cyst which is a typically benign finding in a reproductive age female. If further acute  evaluation is felt to be clinically warranted pelvic ultrasound could be obtained at this time otherwise consider short-term 6-12 week follow-up to assess for resolution. This recommendation follows ACR consensus guidelines: White Paper of the ACR Incidental Findings Committee II on Adnexal Findings. J Am Coll Radiol [phone]. 4. Hepatic steatosis. Electronically Signed   By: Lovena Le M.D.   On: 09/26/2019 21:28   US ABDOMEN LIMITED RUQ  Result Date: 09/26/2019 CLINICAL DATA:  Right-sided abdominal pain EXAM: ULTRASOUND ABDOMEN LIMITED RIGHT UPPER QUADRANT COMPARISON:  CT on the same day FINDINGS: Gallbladder: The sonographic Percell Miller sign is negative. There is no gallbladder wall thickening. Gallbladder is distended. No gallstones are identified. There is no pericholecystic free fluid. Common bile duct: Diameter: Common bile duct is dilated measuring approximately 1.1 cm. Liver: Diffuse increased echogenicity with slightly heterogeneous liver. Appearance typically secondary to fatty infiltration. Fibrosis secondary consideration. No secondary findings of cirrhosis noted. No focal hepatic lesion or intrahepatic biliary duct dilatation. Portal vein is patent on color Doppler imaging with normal direction of blood flow towards the liver. Other: None. IMPRESSION: 1. No gallstones identified. 2. Distended gallbladder without evidence for acute cholecystitis. 3. Dilated common bile duct measuring 1.1 cm. This can be further evaluated with MRCP/ERCP as clinically indicated. 4. Hepatic steatosis. Electronically Signed   By: Constance Holster M.D.   On: 09/26/2019 22:49      IMPRESSION AND PLAN:   1.  Mild obstructive jaundice with common bile duct dilatation and possible biliary stricture in the region of the ampulla on MRCP with elevated LFTs.  The patient will be admitted to medical monitored bed.  Pain management will be provided.  We will follow LFTs.  A GI consultation will be obtained by Dr.  Allen Norris.  I notified him about the patient.  2.  Alcohol abuse.  The patient will be placed on as needed IV Ativan and a banana bag daily.  3.  GERD.  PPI therapy would be resumed.  4.  DVT prophylaxis.  Subcutaneous Lovenox   All the records are reviewed and case discussed with ED provider. The plan of care was discussed in details with the patient (and family). I answered all questions. The patient agreed to proceed with the above mentioned plan. Further management will depend upon hospital course.   CODE STATUS: Full code  TOTAL TIME TAKING CARE OF THIS PATIENT: 55 minutes.    Christel Mormon M.D on 09/27/2019 at 3:10 AM  [facility]   From 7 PM-7 AM, contact night-coverage [email or web address]  CC: Primary care physician; Patient, No Pcp Per   Note: This dictation was prepared with Dragon dictation along with smaller phrase technology. Any transcriptional errors that result from this process are unintentional.

## 2019-09-27 NOTE — ED Provider Notes (Signed)
-----------------------------------------   1:55 AM on 09/27/2019 -----------------------------------------  Discussed the results of MRCP with radiologist -small stones in distal CBD with dilation of CBD  Updated patient of MRI results.  Will keep patient n.p.o. and discuss with hospitalist services for admission.   Paulette Blanch, MD 09/27/19 [phone]

## 2019-09-27 NOTE — ED Notes (Signed)
Attempted to call report./ Unable to accept report (rooming issue). Per charge RN Remo Lipps informed this RN he will call the [facility] and let me know when this issue is resolved.

## 2019-09-27 NOTE — Consult Note (Signed)
Lucilla Lame, MD [facility]  [address], [place], [place] [address] Phone: [phone] Fax : [phone]  Consultation  Referring Provider:     Dr. Sidney Ace Primary Care Physician:  Patient, No Pcp Per Primary Gastroenterologist:      Althia Forts    Reason for Consultation:     Dilated common bile duct  Date of Admission:  09/26/2019 Date of Consultation:  09/27/2019         HPI:   Adlyn Fife is a 34 y.o. female who has a history of alcohol abuse with having a PEG tube in the past due to severe protein malnutrition and documentation by her previous gastroenterologist, Dr. Rayann Heman, that she has possible Crohn's disease.  The patient has not followed up with anybody from GI since then.  The patient reports that she was having abdominal pain that she first told me was on the left side and then said that it was on the right side.  She denies that fatty foods or greasy foods make her symptoms any worse but states that she eats a lot of vegetables.  The patient also reports that the pain was associated with nausea without vomiting.  She also endorses that she has chronic diarrhea. Of note is the patient is not forthcoming with any information and repeatedly closes her eyes and falls asleep while talking to her. The patient's imaging did not show any obstructing stones but did show a tapering of the distal common bile duct at the level of the ampulla.  The imaging also showed her to have fatty liver.  The imaging also showed the patient to have pancreatic divisum. The patient's pattern of hepatitis shows her AST to be higher than ALT with a bilirubin of 2.2.  This may be consistent with not only biliary obstruction but her alcoholic use and overlapping alcoholic hepatitis.  Past Medical History:  Diagnosis Date  . Alcohol abuse 11/2014   drinking since age 83. chronic, recurrent. at least 2 detox admits before 2015.   Marland Kitchen Alcoholic hepatitis 02/6810   hepatic steatosis on 11/2014 ultrasound.    . Breast cancer [facility])    age 65, lump removed from left breast  . Chlamydia 2007   bacterial vaginosis 09/2011  . Colitis 12/2014   colonoscopy for diarrhea and abnml CT 01/06/15: erythmatous TI (path: active ileitis, ? emerging IBD), rectal erythema (path: mucosal prolapse). Random bx of normal colon (path unremarkable)   . Congenital deafness    left ear only.  Also noted in mother and sibling (unilateral).   . Crohn's disease ([place])   . Depression with anxiety initally at age 52  . Failure to thrive in adult 12/2014.    Malnutrition: n/v, not eating, weight loss, BMI 14.  s/p 01/11/2015 PEG (Dr Dorna Leitz).   . Hypertensive urgency   . Irregular heart beat 2010   wt/diet related after evaluation  . Pancreas divisum 02/2015   Type 1 seen on CT.   Marland Kitchen Pneumothorax, spontaneous, tension   . Renal disorder   . Seizure ([place]) 06/2015   due to ETOH/benzo withdrawal.   . Spontaneous pneumothorax 08/2012   right.  chest tube placed.   . Thrombocytopenia ([place]) 04/2007    Past Surgical History:  Procedure Laterality Date  . CESAREAN SECTION  07/2009    x 1.  G3, Para 2012.   . CHEST TUBE INSERTION    . COLONOSCOPY WITH PROPOFOL N/A 01/06/2015   [facility], Dr Rayann Heman. colonoscopy for diarrhea and  abnml CT 01/06/15: erythmatous TI (path: active ileitis, ? emerging IBD), rectal erythema (path: mucosal prolapse). Random bx of normal colon (path unremarkable)   . ESOPHAGOGASTRODUODENOSCOPY N/A 01/06/2015   ;[facility], Dr Rayann Heman. For wt loss, N/V: Normal study, duodenal biopsy/pathology: chronic active duodenitis.   Marland Kitchen ESOPHAGOGASTRODUODENOSCOPY N/A 01/11/2015   Rein-normal with PEG placement  . PEG PLACEMENT N/A 01/11/2015   [facility], Dr Rayann Heman.  for N/V/wt loss/severe malnutrition.     Prior to Admission medications   Medication Sig Start Date End Date Taking? Authorizing Provider  albuterol (PROVENTIL HFA;VENTOLIN HFA) 108 (90 Base) MCG/ACT inhaler Inhale 2 puffs into the lungs every 6 (six) hours as needed for wheezing or  shortness of breath. Patient not taking: Reported on 09/27/2019 02/13/18   Demetrios Loll, MD  cephALEXin (KEFLEX) 500 MG capsule Take 1 capsule (500 mg total) by mouth every 8 (eight) hours. Patient not taking: Reported on 09/27/2019 02/13/18   Demetrios Loll, MD  Melatonin 5 MG TABS Take 5 mg by mouth at bedtime as needed (sleep).    [provider]    Family History  Problem Relation Age of Onset  . Thyroid disease Mother   . Cancer Mother        breast cancer  . Cancer Father        lung cancer  . Stroke Other   . Crohn's disease Brother   . Cancer Maternal Grandmother        breast cancer  . Anesthesia problems Neg Hx      Social History   Tobacco Use  . Smoking status: Current Every Day Smoker    Packs/day: 1.00    Years: 13.00    Pack years: 13.00    Types: E-cigarettes  . Smokeless tobacco: Never Used  Substance Use Topics  . Alcohol use: Yes    Alcohol/week: 36.0 standard drinks    Types: 36 Cans of beer per week    Comment: Binge Drink that usually last for 2 days.  Last drink: 24 hours ago.   . Drug use: No    Allergies as of 09/26/2019 - Review Complete 09/26/2019  Allergen Reaction Noted  . Aspirin Shortness Of Breath 05/23/2015  . Effexor [venlafaxine] Anaphylaxis 01/13/2015  . Gabapentin Anaphylaxis 05/30/2016  . Libritabs [chlordiazepoxide] Anaphylaxis 02/16/2017  . Sulfa antibiotics Anaphylaxis 08/31/2011  . Tetracyclines & related Anaphylaxis 08/31/2011  . Trazodone and nefazodone Anaphylaxis 01/13/2015  . Latex Hives and Itching 08/31/2011  . Paxil [paroxetine] Hives and Itching 01/13/2015  . Seroquel [quetiapine fumarate] Hives and Itching 01/13/2015  . Zoloft [sertraline hcl] Hives and Itching 01/13/2015    Review of Systems:    All systems reviewed and negative except where noted in HPI.   Physical Exam:  Vital signs in last 24 hours: Temp:  [98.3 F (36.8 C)-99.1 F (37.3 C)] 99.1 F (37.3 C) (01/31 1413) Pulse Rate:  [79-114] 82  (01/31 1413) Resp:  [16-20] 16 (01/31 1413) BP: (100-128)/(62-83) 100/62 (01/31 1413) SpO2:  [95 %-99 %] 96 % (01/31 1413) Weight:  [64.4 kg] 64.4 kg (01/30 1537)   General:   Somnolent ,not cooperative in NAD Head:  Normocephalic and atraumatic. Eyes:   No icterus.   Conjunctiva pink. PERRLA. Ears:  Normal auditory acuity. Neck:  Supple; no masses or thyroidomegaly Lungs: Respirations even and unlabored. Lungs clear to auscultation bilaterally.   No wheezes, crackles, or rhonchi.  Heart:  Regular rate and rhythm;  Without murmur, clicks, rubs or gallops Abdomen:  Soft, nondistended, nontender.  Normal bowel sounds. No appreciable masses or hepatomegaly.  No rebound or guarding.  Rectal:  Not performed. Msk:  Symmetrical without gross deformities.   Extremities:  Without edema, cyanosis or clubbing. Neurologic:  Alert and oriented x3;  grossly normal neurologically. Skin:  Intact without significant lesions or rashes. Cervical Nodes:  No significant cervical adenopathy. Psych:  Alert and cooperative. Normal flat.  LAB RESULTS: Recent Labs    09/26/19 1541 09/27/19 0443  WBC 6.9 8.6  HGB 13.3 12.5  HCT 39.3 36.7  PLT 167 131*   BMET Recent Labs    09/26/19 1541 09/27/19 0443  NA 135 133*  K 3.5 3.8  CL 101 100  CO2 23 22  GLUCOSE 136* 85  BUN 11 10  CREATININE 0.56 0.52  CALCIUM 8.7* 8.2*   LFT Recent Labs    09/27/19 0443  PROT 6.7  ALBUMIN 3.0*  AST 173*  ALT 74*  ALKPHOS 137*  BILITOT 2.2*   PT/INR Recent Labs    09/27/19 0443  LABPROT 14.0  INR 1.1    STUDIES: CT ABDOMEN PELVIS W CONTRAST  Result Date: 09/26/2019 CLINICAL DATA:  Right upper quadrant pain, no fever or leukocytosis EXAM: CT ABDOMEN AND PELVIS WITH CONTRAST TECHNIQUE: Multidetector CT imaging of the abdomen and pelvis was performed using the standard protocol following bolus administration of intravenous contrast. CONTRAST:  117m OMNIPAQUE IOHEXOL 300 MG/ML  SOLN COMPARISON:  CT  02/08/2018 FINDINGS: Lower chest: Lung bases are clear. Normal heart size. No pericardial effusion. Hepatobiliary: Diffuse hepatic hypoattenuation compatible with hepatic steatosis. Subcentimeter hypoattenuating focus in the left lobe liver (2/16) is too small to fully characterize on CT imaging but statistically likely benign. Mild gallbladder distention but without visible calcified gallstones. There is moderate extrahepatic biliary ductal dilatation with only mild intrahepatic dilatation. Common bile duct measures up to 11 mm distally. No visible obstructing mass or calcified gallstones. No pericholecystic inflammation. Pancreas: Unremarkable. No pancreatic ductal dilatation or surrounding inflammatory changes. Spleen: Normal in size without focal abnormality. Adrenals/Urinary Tract: Adrenal glands are unremarkable. Kidneys are normal, without renal calculi, focal lesion, or hydronephrosis. Question some mild bladder wall thickening. No bladder debris, calculi or other abnormality. Stomach/Bowel: Distal esophagus, stomach and duodenal sweep are unremarkable. No small bowel wall thickening or dilatation. No evidence of obstruction. A normal appendix is visualized. No colonic dilatation or wall thickening. Vascular/Lymphatic: Atherosclerotic plaque within the normal caliber aorta. No suspicious or enlarged lymph nodes in the included lymphatic chains. Reproductive: Anteverted uterus. There is a 3.6 cm cystic structure in the right adnexa with some high attenuation material layering inferiorly and dependently within the collection could reflect a hemorrhagic cyst. No other adnexal abnormalities. Other: No free fluid. No free air. No bowel containing hernias. Mild diastasis recti. Scarring likely from prior port placement to the left of the umbilicus. Musculoskeletal: No acute osseous abnormality or suspicious osseous lesion. IMPRESSION: 1. Moderate extrahepatic biliary ductal dilatation with only mild intrahepatic  dilatation. No visible obstructing mass or calcified gallstones. Correlate with LFTs and if there is concern for obstructive choledocholithiasis, consider MRCP. 2. Question some mild bladder wall thickening. Correlate with urinalysis to exclude cystitis. 3. Right adnexal 3.6 cm cystic structure with some high attenuation material layering inferiorly and dependently within the collection could reflect a hemorrhagic cyst which is a typically benign finding in a reproductive age female. If further acute evaluation is felt to be clinically warranted pelvic ultrasound could be obtained at this time otherwise consider short-term 6-12 week follow-up  to assess for resolution. This recommendation follows ACR consensus guidelines: White Paper of the ACR Incidental Findings Committee II on Adnexal Findings. J Am Coll Radiol [phone]. 4. Hepatic steatosis. Electronically Signed   By: Lovena Le M.D.   On: 09/26/2019 21:28   MR 3D Recon At Scanner  Result Date: 09/27/2019 CLINICAL DATA:  Right-sided abdominal pain. Alcohol abuse. Alcoholic hepatitis. Colitis. Crohn disease. EXAM: MRI ABDOMEN WITHOUT AND WITH CONTRAST (INCLUDING MRCP) TECHNIQUE: Multiplanar multisequence MR imaging of the abdomen was performed both before and after the administration of intravenous contrast. Heavily T2-weighted images of the biliary and pancreatic ducts were obtained, and three-dimensional MRCP images were rendered by post processing. CONTRAST:  7.92m GADAVIST GADOBUTROL 1 MMOL/ML IV SOLN COMPARISON:  Ultrasound 09/26/2019.  CT 09/26/2019 CT 08/08/2015. FINDINGS: Lower chest: Normal heart size without pericardial or pleural effusion. Hepatobiliary: Moderate hepatic steatosis. Lateral segment left liver lobe cyst. Borderline intrahepatic duct dilatation, similar. Mild gallbladder distension is similar to the 2016 exam. The cystic duct inserts low on the common duct. Both ducts are mildly dilated. For example, the common duct measures  up to 12 mm on 42/22 versus maximally 9 mm on 08/08/2015 (when remeasured). Tapers relatively abruptly in the region of the ampulla, including on 18/3. No obstructive stone or mass. Pancreas: Pancreas divisum, as evidenced by a prominent dorsal duct entering the duodenum including on 23/10. No evidence of pancreatic duct dilatation or acute pancreatitis. Spleen:  Normal in size, without focal abnormality. Adrenals/Urinary Tract: Normal adrenal glands. Normal kidneys, without hydronephrosis. Stomach/Bowel: Normal stomach, without wall thickening. Normal abdominal bowel loops. Vascular/Lymphatic: Aortic atherosclerosis. Patent portal and hepatic veins. No abdominal adenopathy. Other:  No ascites. Musculoskeletal: No acute osseous abnormality. IMPRESSION: 1. Gallbladder and common duct dilatation, with relatively abrupt tapering in the region of the ampulla. This is progressive since the exam of 08/08/2015, but has a similar morphology to that study. Similarly morphology favors a benign etiology. Given elevated bilirubin, a stricture in the region of the ampulla cannot be entirely excluded. ERCP may be informative. 2. Hepatic steatosis. 3. Aortic Atherosclerosis (ICD10-I70.0). Significantly age advanced. 4. Pancreas divisum. Electronically Signed   By: KAbigail MiyamotoM.D.   On: 09/27/2019 10:13   MR ABDOMEN MRCP W WO CONTAST  Result Date: 09/27/2019 CLINICAL DATA:  Right-sided abdominal pain. Alcohol abuse. Alcoholic hepatitis. Colitis. Crohn disease. EXAM: MRI ABDOMEN WITHOUT AND WITH CONTRAST (INCLUDING MRCP) TECHNIQUE: Multiplanar multisequence MR imaging of the abdomen was performed both before and after the administration of intravenous contrast. Heavily T2-weighted images of the biliary and pancreatic ducts were obtained, and three-dimensional MRCP images were rendered by post processing. CONTRAST:  7.5104mGADAVIST GADOBUTROL 1 MMOL/ML IV SOLN COMPARISON:  Ultrasound 09/26/2019.  CT 09/26/2019 CT 08/08/2015.  FINDINGS: Lower chest: Normal heart size without pericardial or pleural effusion. Hepatobiliary: Moderate hepatic steatosis. Lateral segment left liver lobe cyst. Borderline intrahepatic duct dilatation, similar. Mild gallbladder distension is similar to the 2016 exam. The cystic duct inserts low on the common duct. Both ducts are mildly dilated. For example, the common duct measures up to 12 mm on 42/22 versus maximally 9 mm on 08/08/2015 (when remeasured). Tapers relatively abruptly in the region of the ampulla, including on 18/3. No obstructive stone or mass. Pancreas: Pancreas divisum, as evidenced by a prominent dorsal duct entering the duodenum including on 23/10. No evidence of pancreatic duct dilatation or acute pancreatitis. Spleen:  Normal in size, without focal abnormality. Adrenals/Urinary Tract: Normal adrenal glands. Normal kidneys, without hydronephrosis. Stomach/Bowel: Normal  stomach, without wall thickening. Normal abdominal bowel loops. Vascular/Lymphatic: Aortic atherosclerosis. Patent portal and hepatic veins. No abdominal adenopathy. Other:  No ascites. Musculoskeletal: No acute osseous abnormality. IMPRESSION: 1. Gallbladder and common duct dilatation, with relatively abrupt tapering in the region of the ampulla. This is progressive since the exam of 08/08/2015, but has a similar morphology to that study. Similarly morphology favors a benign etiology. Given elevated bilirubin, a stricture in the region of the ampulla cannot be entirely excluded. ERCP may be informative. 2. Hepatic steatosis. 3. Aortic Atherosclerosis (ICD10-I70.0). Significantly age advanced. 4. Pancreas divisum. Electronically Signed   By: Abigail Miyamoto M.D.   On: 09/27/2019 10:13   US ABDOMEN LIMITED RUQ  Result Date: 09/26/2019 CLINICAL DATA:  Right-sided abdominal pain EXAM: ULTRASOUND ABDOMEN LIMITED RIGHT UPPER QUADRANT COMPARISON:  CT on the same day FINDINGS: Gallbladder: The sonographic Percell Miller sign is negative.  There is no gallbladder wall thickening. Gallbladder is distended. No gallstones are identified. There is no pericholecystic free fluid. Common bile duct: Diameter: Common bile duct is dilated measuring approximately 1.1 cm. Liver: Diffuse increased echogenicity with slightly heterogeneous liver. Appearance typically secondary to fatty infiltration. Fibrosis secondary consideration. No secondary findings of cirrhosis noted. No focal hepatic lesion or intrahepatic biliary duct dilatation. Portal vein is patent on color Doppler imaging with normal direction of blood flow towards the liver. Other: None. IMPRESSION: 1. No gallstones identified. 2. Distended gallbladder without evidence for acute cholecystitis. 3. Dilated common bile duct measuring 1.1 cm. This can be further evaluated with MRCP/ERCP as clinically indicated. 4. Hepatic steatosis. Electronically Signed   By: Constance Holster M.D.   On: 09/26/2019 22:49      Impression / Plan:   Assessment: Active Problems:   Biliary stones   NSTEMI (non-ST elevated myocardial infarction) ([place])   Kalena Mander is a 34 y.o. y/o female with a 1.1 cm common bile duct that has increased in size since her last imaging in 2016.  The patient is not a very good historian and stated that she had pain on the left side and then change it to the right side and continuously fell asleep multiple times while trying to talk to her today.  The patient may have a distal common bile duct stricture and ampullary adenomas are more common in patients with Crohn's.  Plan:  The patient will be set up for an ERCP for tomorrow.  I have tried to explain the procedure to the patient but I am not sure that she was awake enough to understand.  I will talk to her again tomorrow before the procedure.  Thank you for involving me in the care of this patient.      LOS: 0 days   Lucilla Lame, MD  09/27/2019, 2:21 PM Pager [phone] 7am-5pm  Check AMION for 5pm -7am coverage  and on weekends   Note: This dictation was prepared with Dragon dictation along with smaller phrase technology. Any transcriptional errors that result from this process are unintentional.

## 2019-09-27 NOTE — Progress Notes (Signed)
Our unit, 1C and the [facility] do not have any Telemetry units at this time - waiting on one to become available. Informed Charge RN, Remo Lipps and hospitalist Randol Kern.

## 2019-09-28 ENCOUNTER — Encounter: Payer: Self-pay | Admitting: Anesthesiology

## 2019-09-28 ENCOUNTER — Inpatient Hospital Stay: Payer: Self-pay | Admitting: Certified Registered Nurse Anesthetist

## 2019-09-28 ENCOUNTER — Inpatient Hospital Stay: Payer: Self-pay

## 2019-09-28 ENCOUNTER — Encounter
Admission: EM | Disposition: A | Discharge status: Home / Self Care | Payer: Self-pay | Source: Home / Self Care | Attending: Internal Medicine

## 2019-09-28 DIAGNOSIS — K8050 Calculus of bile duct without cholangitis or cholecystitis without obstruction: Secondary | ICD-10-CM

## 2019-09-28 DIAGNOSIS — K838 Other specified diseases of biliary tract: Secondary | ICD-10-CM

## 2019-09-28 HISTORY — PX: ERCP: SHX5425

## 2019-09-28 SURGERY — ERCP, WITH INTERVENTION IF INDICATED
Anesthesia: General

## 2019-09-28 MED ORDER — ONDANSETRON HCL 4 MG/2ML IJ SOLN
INTRAMUSCULAR | Status: AC
  Filled 2019-09-28: qty 2

## 2019-09-28 MED ORDER — ROCURONIUM BROMIDE 50 MG/5ML IV SOLN
INTRAVENOUS | Status: AC
  Filled 2019-09-28: qty 1

## 2019-09-28 MED ORDER — INDOMETHACIN 50 MG RE SUPP
RECTAL | Status: DC | PRN
  Administered 2019-09-28: 100 mg via RECTAL

## 2019-09-28 MED ORDER — SUCCINYLCHOLINE CHLORIDE 20 MG/ML IJ SOLN
INTRAMUSCULAR | Status: DC | PRN
  Administered 2019-09-28: 100 mg via INTRAVENOUS

## 2019-09-28 MED ORDER — DEXAMETHASONE SODIUM PHOSPHATE 4 MG/ML IJ SOLN
INTRAMUSCULAR | Status: AC
  Filled 2019-09-28: qty 1

## 2019-09-28 MED ORDER — PROPOFOL 10 MG/ML IV BOLUS
INTRAVENOUS | Status: AC
  Filled 2019-09-28: qty 20

## 2019-09-28 MED ORDER — LORAZEPAM 2 MG/ML IJ SOLN
0.0000 mg | Freq: Four times a day (QID) | INTRAMUSCULAR | Status: DC | PRN
  Administered 2019-09-28 – 2019-09-29 (×3): 2 mg via INTRAVENOUS
  Filled 2019-09-28 (×3): qty 1

## 2019-09-28 MED ORDER — ROCURONIUM BROMIDE 100 MG/10ML IV SOLN
INTRAVENOUS | Status: DC | PRN
  Administered 2019-09-28: 5 mg via INTRAVENOUS

## 2019-09-28 MED ORDER — PROPOFOL 10 MG/ML IV BOLUS
INTRAVENOUS | Status: DC | PRN
  Administered 2019-09-28: 150 mg via INTRAVENOUS

## 2019-09-28 MED ORDER — LACTATED RINGERS IV SOLN: INTRAVENOUS | Status: DC

## 2019-09-28 MED ORDER — SUCCINYLCHOLINE CHLORIDE 20 MG/ML IJ SOLN
INTRAMUSCULAR | Status: AC
  Filled 2019-09-28: qty 1

## 2019-09-28 MED ORDER — ADULT MULTIVITAMIN W/MINERALS CH
1.0000 | ORAL_TABLET | Freq: Every day | ORAL | Status: DC
  Administered 2019-09-29: 1 via ORAL
  Filled 2019-09-28: qty 1

## 2019-09-28 MED ORDER — ONDANSETRON HCL 4 MG/2ML IJ SOLN
INTRAMUSCULAR | Status: DC | PRN
  Administered 2019-09-28: 4 mg via INTRAVENOUS

## 2019-09-28 MED ORDER — LIDOCAINE HCL (CARDIAC) PF 100 MG/5ML IV SOSY
PREFILLED_SYRINGE | INTRAVENOUS | Status: DC | PRN
  Administered 2019-09-28: 80 mg via INTRAVENOUS

## 2019-09-28 MED ORDER — MORPHINE SULFATE (PF) 2 MG/ML IV SOLN
1.0000 mg | INTRAVENOUS | Status: DC | PRN
  Administered 2019-09-28 – 2019-09-29 (×2): 2 mg via INTRAVENOUS
  Filled 2019-09-28 (×2): qty 1

## 2019-09-28 MED ORDER — INDOMETHACIN 50 MG RE SUPP: 50.0000 mg | Freq: Once | RECTAL | Status: DC

## 2019-09-28 MED ORDER — LACTATED RINGERS IV SOLN
INTRAVENOUS | Status: DC
  Administered 2019-09-28: 1000 mL via INTRAVENOUS

## 2019-09-28 MED ORDER — LIDOCAINE HCL (PF) 2 % IJ SOLN
INTRAMUSCULAR | Status: AC
  Filled 2019-09-28: qty 5

## 2019-09-28 MED ORDER — DEXAMETHASONE SODIUM PHOSPHATE 10 MG/ML IJ SOLN
INTRAMUSCULAR | Status: DC | PRN
  Administered 2019-09-28: 8 mg via INTRAVENOUS

## 2019-09-28 MED ORDER — FENTANYL CITRATE (PF) 100 MCG/2ML IJ SOLN
INTRAMUSCULAR | Status: DC | PRN
  Administered 2019-09-28 (×2): 50 ug via INTRAVENOUS

## 2019-09-28 MED ORDER — SEVOFLURANE IN SOLN
RESPIRATORY_TRACT | Status: AC
  Filled 2019-09-28: qty 250

## 2019-09-28 MED ORDER — INDOMETHACIN 50 MG RE SUPP
RECTAL | Status: AC
  Filled 2019-09-28: qty 2

## 2019-09-28 MED ORDER — FENTANYL CITRATE (PF) 100 MCG/2ML IJ SOLN
INTRAMUSCULAR | Status: AC
  Filled 2019-09-28: qty 2

## 2019-09-28 NOTE — Transfer of Care (Signed)
Immediate Anesthesia Transfer of Care Note  Patient: Kristine Macias  Procedure(s) Performed: ENDOSCOPIC RETROGRADE CHOLANGIOPANCREATOGRAPHY (ERCP) (N/A )  Patient Location: PACU  Anesthesia Type:General  Level of Consciousness: awake and drowsy  Airway & Oxygen Therapy: Patient Spontanous Breathing and Patient connected to face mask oxygen  Post-op Assessment: Report given to RN and Post -op Vital signs reviewed and stable  Post vital signs: Reviewed and stable  Last Vitals:  Vitals Value Taken Time  BP 108/73 09/28/19 1253  Temp 36.2 C 09/28/19 1253  Pulse 95 09/28/19 1253  Resp 17 09/28/19 1253  SpO2 96 % 09/28/19 1253  Vitals shown include unvalidated device data.  Last Pain:  Vitals:   09/28/19 1253  TempSrc: Temporal  PainSc:       Patients Stated Pain Goal: 0 (95/18/84 1660)  Complications: No apparent anesthesia complications

## 2019-09-28 NOTE — Progress Notes (Signed)
[place] [facility] [place] NAME: Marcianna Daily    MR#:  [id number]  DATE OF BIRTH:  February 11, 1986  SUBJECTIVE:   Patient came in the emergency room with abdominal pain right-sided. No fever nausea vomiting Denies any new complaints  REVIEW OF SYSTEMS:   Review of Systems  Constitutional: Negative for chills, fever and weight loss.  HENT: Negative for ear discharge, ear pain and nosebleeds.   Eyes: Negative for blurred vision, pain and discharge.  Respiratory: Negative for sputum production, shortness of breath, wheezing and stridor.   Cardiovascular: Negative for chest pain, palpitations, orthopnea and PND.  Gastrointestinal: Positive for abdominal pain. Negative for diarrhea, nausea and vomiting.  Genitourinary: Negative for frequency and urgency.  Musculoskeletal: Negative for back pain and joint pain.  Neurological: Negative for sensory change, speech change, focal weakness and weakness.  Psychiatric/Behavioral: Negative for depression and hallucinations. The patient is not nervous/anxious.    Tolerating Diet:npo Tolerating PT: not needed  DRUG ALLERGIES:   Allergies  Allergen Reactions  . Aspirin Shortness Of Breath  . Effexor [Venlafaxine] Anaphylaxis  . Gabapentin Anaphylaxis  . Libritabs [Chlordiazepoxide] Anaphylaxis    Tolerates lorazepam and clonazepam  . Sulfa Antibiotics Anaphylaxis  . Tetracyclines & Related Anaphylaxis  . Trazodone And Nefazodone Anaphylaxis  . Latex Hives and Itching  . Paxil [Paroxetine] Hives and Itching  . Seroquel [Quetiapine Fumarate] Hives and Itching  . Zoloft [Sertraline Hcl] Hives and Itching    VITALS:  Blood pressure 120/85, pulse (!) 104, temperature 97.8 F (36.6 C), temperature source Oral, resp. rate 20, height 5' 6"  (1.676 m), weight 64.4 kg, SpO2 94 %.  PHYSICAL EXAMINATION:   Physical Exam  GENERAL:  34 y.o.-year-old patient lying in the bed with no acute distress.  EYES:  Pupils equal, round, reactive to light and accommodation. No scleral icterus.   HEENT: Head atraumatic, normocephalic. Oropharynx and nasopharynx clear.  NECK:  Supple, no jugular venous distention. No thyroid enlargement, no tenderness.  LUNGS: Normal breath sounds bilaterally, no wheezing, rales, rhonchi. No use of accessory muscles of respiration.  CARDIOVASCULAR: S1, S2 normal. No murmurs, rubs, or gallops.  ABDOMEN: Soft, nontender, nondistended. Bowel sounds present. No organomegaly or mass.  EXTREMITIES: No cyanosis, clubbing or edema b/l.    NEUROLOGIC: Cranial nerves II through XII are intact. No focal Motor or sensory deficits b/l.   PSYCHIATRIC:  patient is alert and oriented x 3.  SKIN: No obvious rash, lesion, or ulcer.   LABORATORY PANEL:  CBC Recent Labs  Lab 09/27/19 0443  WBC 8.6  HGB 12.5  HCT 36.7  PLT 131*    Chemistries  Recent Labs  Lab 09/27/19 0443  NA 133*  K 3.8  CL 100  CO2 22  GLUCOSE 85  BUN 10  CREATININE 0.52  CALCIUM 8.2*  AST 173*  ALT 74*  ALKPHOS 137*  BILITOT 2.2*   Cardiac Enzymes No results for input(s): TROPONINI in the last 168 hours. RADIOLOGY:  CT ABDOMEN PELVIS W CONTRAST  Result Date: 09/26/2019 CLINICAL DATA:  Right upper quadrant pain, no fever or leukocytosis EXAM: CT ABDOMEN AND PELVIS WITH CONTRAST TECHNIQUE: Multidetector CT imaging of the abdomen and pelvis was performed using the standard protocol following bolus administration of intravenous contrast. CONTRAST:  153m OMNIPAQUE IOHEXOL 300 MG/ML  SOLN COMPARISON:  CT 02/08/2018 FINDINGS: Lower chest: Lung bases are clear. Normal heart size. No pericardial effusion. Hepatobiliary: Diffuse hepatic hypoattenuation compatible with hepatic steatosis. Subcentimeter hypoattenuating focus  in the left lobe liver (2/16) is too small to fully characterize on CT imaging but statistically likely benign. Mild gallbladder distention but without visible calcified gallstones. There is  moderate extrahepatic biliary ductal dilatation with only mild intrahepatic dilatation. Common bile duct measures up to 11 mm distally. No visible obstructing mass or calcified gallstones. No pericholecystic inflammation. Pancreas: Unremarkable. No pancreatic ductal dilatation or surrounding inflammatory changes. Spleen: Normal in size without focal abnormality. Adrenals/Urinary Tract: Adrenal glands are unremarkable. Kidneys are normal, without renal calculi, focal lesion, or hydronephrosis. Question some mild bladder wall thickening. No bladder debris, calculi or other abnormality. Stomach/Bowel: Distal esophagus, stomach and duodenal sweep are unremarkable. No small bowel wall thickening or dilatation. No evidence of obstruction. A normal appendix is visualized. No colonic dilatation or wall thickening. Vascular/Lymphatic: Atherosclerotic plaque within the normal caliber aorta. No suspicious or enlarged lymph nodes in the included lymphatic chains. Reproductive: Anteverted uterus. There is a 3.6 cm cystic structure in the right adnexa with some high attenuation material layering inferiorly and dependently within the collection could reflect a hemorrhagic cyst. No other adnexal abnormalities. Other: No free fluid. No free air. No bowel containing hernias. Mild diastasis recti. Scarring likely from prior port placement to the left of the umbilicus. Musculoskeletal: No acute osseous abnormality or suspicious osseous lesion. IMPRESSION: 1. Moderate extrahepatic biliary ductal dilatation with only mild intrahepatic dilatation. No visible obstructing mass or calcified gallstones. Correlate with LFTs and if there is concern for obstructive choledocholithiasis, consider MRCP. 2. Question some mild bladder wall thickening. Correlate with urinalysis to exclude cystitis. 3. Right adnexal 3.6 cm cystic structure with some high attenuation material layering inferiorly and dependently within the collection could reflect a  hemorrhagic cyst which is a typically benign finding in a reproductive age female. If further acute evaluation is felt to be clinically warranted pelvic ultrasound could be obtained at this time otherwise consider short-term 6-12 week follow-up to assess for resolution. This recommendation follows ACR consensus guidelines: White Paper of the ACR Incidental Findings Committee II on Adnexal Findings. J Am Coll Radiol [phone]. 4. Hepatic steatosis. Electronically Signed   By: Lovena Le M.D.   On: 09/26/2019 21:28   MR 3D Recon At Scanner  Result Date: 09/27/2019 CLINICAL DATA:  Right-sided abdominal pain. Alcohol abuse. Alcoholic hepatitis. Colitis. Crohn disease. EXAM: MRI ABDOMEN WITHOUT AND WITH CONTRAST (INCLUDING MRCP) TECHNIQUE: Multiplanar multisequence MR imaging of the abdomen was performed both before and after the administration of intravenous contrast. Heavily T2-weighted images of the biliary and pancreatic ducts were obtained, and three-dimensional MRCP images were rendered by post processing. CONTRAST:  7.69m GADAVIST GADOBUTROL 1 MMOL/ML IV SOLN COMPARISON:  Ultrasound 09/26/2019.  CT 09/26/2019 CT 08/08/2015. FINDINGS: Lower chest: Normal heart size without pericardial or pleural effusion. Hepatobiliary: Moderate hepatic steatosis. Lateral segment left liver lobe cyst. Borderline intrahepatic duct dilatation, similar. Mild gallbladder distension is similar to the 2016 exam. The cystic duct inserts low on the common duct. Both ducts are mildly dilated. For example, the common duct measures up to 12 mm on 42/22 versus maximally 9 mm on 08/08/2015 (when remeasured). Tapers relatively abruptly in the region of the ampulla, including on 18/3. No obstructive stone or mass. Pancreas: Pancreas divisum, as evidenced by a prominent dorsal duct entering the duodenum including on 23/10. No evidence of pancreatic duct dilatation or acute pancreatitis. Spleen:  Normal in size, without focal  abnormality. Adrenals/Urinary Tract: Normal adrenal glands. Normal kidneys, without hydronephrosis. Stomach/Bowel: Normal stomach, without wall thickening. Normal  abdominal bowel loops. Vascular/Lymphatic: Aortic atherosclerosis. Patent portal and hepatic veins. No abdominal adenopathy. Other:  No ascites. Musculoskeletal: No acute osseous abnormality. IMPRESSION: 1. Gallbladder and common duct dilatation, with relatively abrupt tapering in the region of the ampulla. This is progressive since the exam of 08/08/2015, but has a similar morphology to that study. Similarly morphology favors a benign etiology. Given elevated bilirubin, a stricture in the region of the ampulla cannot be entirely excluded. ERCP may be informative. 2. Hepatic steatosis. 3. Aortic Atherosclerosis (ICD10-I70.0). Significantly age advanced. 4. Pancreas divisum. Electronically Signed   By: Abigail Miyamoto M.D.   On: 09/27/2019 10:13   MR ABDOMEN MRCP W WO CONTAST  Result Date: 09/27/2019 CLINICAL DATA:  Right-sided abdominal pain. Alcohol abuse. Alcoholic hepatitis. Colitis. Crohn disease. EXAM: MRI ABDOMEN WITHOUT AND WITH CONTRAST (INCLUDING MRCP) TECHNIQUE: Multiplanar multisequence MR imaging of the abdomen was performed both before and after the administration of intravenous contrast. Heavily T2-weighted images of the biliary and pancreatic ducts were obtained, and three-dimensional MRCP images were rendered by post processing. CONTRAST:  7.53m GADAVIST GADOBUTROL 1 MMOL/ML IV SOLN COMPARISON:  Ultrasound 09/26/2019.  CT 09/26/2019 CT 08/08/2015. FINDINGS: Lower chest: Normal heart size without pericardial or pleural effusion. Hepatobiliary: Moderate hepatic steatosis. Lateral segment left liver lobe cyst. Borderline intrahepatic duct dilatation, similar. Mild gallbladder distension is similar to the 2016 exam. The cystic duct inserts low on the common duct. Both ducts are mildly dilated. For example, the common duct measures up to 12 mm  on 42/22 versus maximally 9 mm on 08/08/2015 (when remeasured). Tapers relatively abruptly in the region of the ampulla, including on 18/3. No obstructive stone or mass. Pancreas: Pancreas divisum, as evidenced by a prominent dorsal duct entering the duodenum including on 23/10. No evidence of pancreatic duct dilatation or acute pancreatitis. Spleen:  Normal in size, without focal abnormality. Adrenals/Urinary Tract: Normal adrenal glands. Normal kidneys, without hydronephrosis. Stomach/Bowel: Normal stomach, without wall thickening. Normal abdominal bowel loops. Vascular/Lymphatic: Aortic atherosclerosis. Patent portal and hepatic veins. No abdominal adenopathy. Other:  No ascites. Musculoskeletal: No acute osseous abnormality. IMPRESSION: 1. Gallbladder and common duct dilatation, with relatively abrupt tapering in the region of the ampulla. This is progressive since the exam of 08/08/2015, but has a similar morphology to that study. Similarly morphology favors a benign etiology. Given elevated bilirubin, a stricture in the region of the ampulla cannot be entirely excluded. ERCP may be informative. 2. Hepatic steatosis. 3. Aortic Atherosclerosis (ICD10-I70.0). Significantly age advanced. 4. Pancreas divisum. Electronically Signed   By: KAbigail MiyamotoM.D.   On: 09/27/2019 10:13   [place] LIMITED RUQ  Result Date: 09/26/2019 CLINICAL DATA:  Right-sided abdominal pain EXAM: ULTRASOUND ABDOMEN LIMITED RIGHT UPPER QUADRANT COMPARISON:  CT on the same day FINDINGS: Gallbladder: The sonographic MPercell Millersign is negative. There is no gallbladder wall thickening. Gallbladder is distended. No gallstones are identified. There is no pericholecystic free fluid. Common bile duct: Diameter: Common bile duct is dilated measuring approximately 1.1 cm. Liver: Diffuse increased echogenicity with slightly heterogeneous liver. Appearance typically secondary to fatty infiltration. Fibrosis secondary consideration. No secondary  findings of cirrhosis noted. No focal hepatic lesion or intrahepatic biliary duct dilatation. Portal vein is patent on color Doppler imaging with normal direction of blood flow towards the liver. Other: None. IMPRESSION: 1. No gallstones identified. 2. Distended gallbladder without evidence for acute cholecystitis. 3. Dilated common bile duct measuring 1.1 cm. This can be further evaluated with MRCP/ERCP as clinically indicated. 4. Hepatic steatosis. Electronically  Signed   By: Constance Holster M.D.   On: 09/26/2019 22:49   ASSESSMENT AND PLAN:  Willye Javier  is a 34 y.o. female with a known history of multiple medical problems presented to the emergency room with acute onset of right upper quadrant abdominal and right-sided pain with associated nausea without vomiting.  She denied any fever or chills.  1. Hyperbilirubinemia/ mild obstructive jaundice  -  Pain management will be provided.  -  LFTs-- Alcoholic hepatitis pattern -US shows fatty liver  -GI consultation with Dr. Allen Norris-- ERCP today  to evaluate for ?biliary stricture -MRCP results noted -IVF  2. Alcohol abuse.   - on as needed IV Ativan  -CIWA protocol -pt says she takes 1 can of beer daily. Did not report any other form of ETOH intake -Seems like she has chronic tremors   3. GERD.   -PPI therapy would be resumed.  4. DVT prophylaxis.  - Subcutaneous Lovenox   Procedures: Family communication :patient said her family is informed Consults :GI Discharge Disposition :home CODE STATUS: FULL DVT Prophylaxis :SCD Barriers to discharge:GI procedure  TOTAL TIME TAKING CARE OF THIS PATIENT: *25* minutes.  >50% time spent on counselling and coordination of care  Note: This dictation was prepared with Dragon dictation along with smaller phrase technology. Any transcriptional errors that result from this process are unintentional.  Fritzi Mandes M.D    [facility]   CC: Primary care physician; Patient, No Pcp  PerPatient ID: Duane Boston, female   DOB: 08-31-1985, 34 y.o.   MRN: [id number]

## 2019-09-28 NOTE — Op Note (Signed)
[facility] Patient Name: Kristine Macias Procedure Date: 09/28/2019 12:38 PM MRN: [id number] Account #: [id number] Date of Birth: March 16, 1986 Admit Type: Inpatient Age: 34 Room: [facility] ENDO ROOM 1 Gender: Female Note Status: Finalized Procedure:             ERCP Indications:           Abnormal MRCP Providers:             Lucilla Lame MD, MD Referring MD:          No Local Md, MD (Referring MD) Medicines:             General Anesthesia Complications:         No immediate complications. Procedure:             Pre-Anesthesia Assessment:                        - Prior to the procedure, a History and Physical was                         performed, and patient medications and allergies were                         reviewed. The patient's tolerance of previous                         anesthesia was also reviewed. The risks and benefits                         of the procedure and the sedation options and risks                         were discussed with the patient. All questions were                         answered, and informed consent was obtained. Prior                         Anticoagulants: The patient has taken no previous                         anticoagulant or antiplatelet agents. ASA Grade                         Assessment: II - A patient with mild systemic disease.                         After reviewing the risks and benefits, the patient                         was deemed in satisfactory condition to undergo the                         procedure.                        After obtaining informed consent, the scope was passed  under direct vision. Throughout the procedure, the                         patient's blood pressure, pulse, and oxygen                         saturations were monitored continuously. The                         Duodenoscope was introduced through the mouth, and                         used to  inject contrast into and used to inject                         contrast into the bile duct. The ERCP was accomplished                         without difficulty. The patient tolerated the                         procedure well. Findings:      The scout film was normal. The esophagus was successfully intubated       under direct vision. The scope was advanced to a normal major papilla in       the descending duodenum without detailed examination of the pharynx,       larynx and associated structures, and upper GI tract. The upper GI tract       was grossly normal. The bile duct was deeply cannulated with the       short-nosed traction sphincterotome. Contrast was injected. I personally       interpreted the bile duct images. There was brisk flow of contrast       through the ducts. Image quality was excellent. Contrast extended to the       entire biliary tree. The main bile duct was moderately dilated. The       largest diameter was 43m. The biliary orifice was stenotic. This       appeared benign. A wire was passed into the biliary tree. An 8 mm       biliary sphincterotomy was made with a traction (standard)       sphincterotome using blended current. There was no post-sphincterotomy       bleeding. The biliary tree was swept with a 15 mm balloon starting at       the bifurcation. Sludge was swept from the duct. Impression:            - Biliary papillary stenosis, benign.                        - The entire main bile duct was moderately dilated.                        - A biliary sphincterotomy was performed.                        - The biliary tree was swept and sludge was found. Recommendation:        - Return patient to hospital ward for ongoing care.                        -  Clear liquid diet today.                        - Continue present medications. Procedure Code(s):     --- Professional ---                        [phone], Endoscopic retrograde cholangiopancreatography                          (ERCP); with removal of calculi/debris from                         biliary/pancreatic duct(s)                        [id number], Endoscopic retrograde cholangiopancreatography                         (ERCP); with sphincterotomy/papillotomy                        [phone], Endoscopic catheterization of the biliary                         ductal system, radiological supervision and                         interpretation Diagnosis Code(s):     --- Professional ---                        R93.2, Abnormal findings on diagnostic imaging of                         liver and biliary tract                        K83.8, Other specified diseases of biliary tract                        K83.1, Obstruction of bile duct CPT copyright 2019 American Medical Association. All rights reserved. The codes documented in this report are preliminary and upon coder review may  be revised to meet current compliance requirements. Lucilla Lame MD, MD 09/28/2019 12:45:39 PM This report has been signed electronically. Number of Addenda: 0 Note Initiated On: 09/28/2019 12:38 PM Estimated Blood Loss:  Estimated blood loss: none.      [facility]

## 2019-09-28 NOTE — Anesthesia Postprocedure Evaluation (Signed)
Anesthesia Post Note  Patient: Kristine Macias  Procedure(s) Performed: ENDOSCOPIC RETROGRADE CHOLANGIOPANCREATOGRAPHY (ERCP) (N/A )  Patient location during evaluation: PACU Anesthesia Type: General Level of consciousness: awake and alert and oriented Pain management: pain level controlled Vital Signs Assessment: post-procedure vital signs reviewed and stable Respiratory status: spontaneous breathing, nonlabored ventilation and respiratory function stable Cardiovascular status: blood pressure returned to baseline and stable Postop Assessment: no signs of nausea or vomiting Anesthetic complications: no     Last Vitals:  Vitals:   09/28/19 1323 09/28/19 1345  BP:  123/83  Pulse:  93  Resp:  16  Temp: 37.2 C 36.8 C  SpO2:  95%    Last Pain:  Vitals:   09/28/19 1345  TempSrc: Oral  PainSc:                  Sabastian Raimondi

## 2019-09-28 NOTE — Anesthesia Procedure Notes (Signed)
Procedure Name: Intubation Performed by: Johnna Acosta, CRNA Pre-anesthesia Checklist: Patient identified, Patient being monitored, Timeout performed, Emergency Drugs available and Suction available Patient Re-evaluated:Patient Re-evaluated prior to induction Oxygen Delivery Method: Circle system utilized Preoxygenation: Pre-oxygenation with 100% oxygen Induction Type: IV induction Ventilation: Mask ventilation without difficulty Laryngoscope Size: 3 and McGraph Grade View: Grade I Tube type: Oral Tube size: 7.0 mm Number of attempts: 1 Airway Equipment and Method: Stylet Placement Confirmation: ETT inserted through vocal cords under direct vision,  positive ETCO2 and breath sounds checked- equal and bilateral Secured at: 21 cm Tube secured with: Tape Dental Injury: Teeth and Oropharynx as per pre-operative assessment

## 2019-09-28 NOTE — Anesthesia Preprocedure Evaluation (Deleted)
Anesthesia Evaluation  Patient identified by MRN, date of birth, ID band Patient awake    Reviewed: Allergy & Precautions, H&P , NPO status , Patient's Chart, lab work & pertinent test results  Airway        Dental   Pulmonary neg pulmonary ROS, Current Smoker,           Cardiovascular hypertension, + Past MI (h/o NSTEMI)    Aortic atherosclerosis   Neuro/Psych Seizures - (alcohol withdrawal),  PSYCHIATRIC DISORDERS Anxiety Depression    GI/Hepatic (+)     substance abuse  alcohol use, Hepatitis -Hepatic steatosis H/o portal hypertension in setting of portal vein thrombosis, apparently resolved MRCP: Gallbladder and common duct dilatation, with relatively abrupt tapering in the region of the ampulla, possible stricture Malnutrition Crohn's disease   Endo/Other  negative endocrine ROS  Renal/GU      Musculoskeletal   Abdominal   Peds  Hematology  (+) Blood dyscrasia, anemia ,   Anesthesia Other Findings Past Medical History: 11/2014: Alcohol abuse     Comment:  drinking since age 56. chronic, recurrent. at least 2               detox admits before 2015.  11/1421: Alcoholic hepatitis     Comment:  hepatic steatosis on 11/2014 ultrasound.  No date: Breast cancer [facility])     Comment:  age 6, lump removed from left breast 2007: Chlamydia     Comment:  bacterial vaginosis 09/2011 12/2014: Colitis     Comment:  colonoscopy for diarrhea and abnml CT 01/06/15:               erythmatous TI (path: active ileitis, ? emerging IBD),               rectal erythema (path: mucosal prolapse). Random bx of               normal colon (path unremarkable)  No date: Congenital deafness     Comment:  left ear only.  Also noted in mother and sibling               (unilateral).  No date: Crohn's disease ([place]) initally at age 31: Depression with anxiety 12/2014. : Failure to thrive in adult     Comment:  Malnutrition: n/v, not eating,  weight loss, BMI 14.  s/p              01/11/2015 PEG (Dr Dorna Leitz).  No date: Hypertensive urgency 2010: Irregular heart beat     Comment:  wt/diet related after evaluation 02/2015: Pancreas divisum     Comment:  Type 1 seen on CT.  No date: Pneumothorax, spontaneous, tension No date: Renal disorder 06/2015: Seizure ([place])     Comment:  due to ETOH/benzo withdrawal.  08/2012: Spontaneous pneumothorax     Comment:  right.  chest tube placed.  04/2007: Thrombocytopenia ([place])  Past Surgical History: 07/2009 : CESAREAN SECTION     Comment:  x 1.  G3, Para 2012.  No date: CHEST TUBE INSERTION 01/06/2015: COLONOSCOPY WITH PROPOFOL; N/A     Comment:  [facility], Dr Rayann Heman. colonoscopy for diarrhea and abnml CT               01/06/15: erythmatous TI (path: active ileitis, ? emerging              IBD), rectal erythema (path: mucosal prolapse). Random bx              of normal colon (  path unremarkable)  01/06/2015: ESOPHAGOGASTRODUODENOSCOPY; N/A     Comment:  ;[facility], Dr Rayann Heman. For wt loss, N/V: Normal study, duodenal              biopsy/pathology: chronic active duodenitis.  01/11/2015: ESOPHAGOGASTRODUODENOSCOPY; N/A     Comment:  Rein-normal with PEG placement 01/11/2015: PEG PLACEMENT; N/A     Comment:  [facility], Dr Rayann Heman.  for N/V/wt loss/severe malnutrition.   BMI    Body Mass Index: 22.92 kg/m      Reproductive/Obstetrics negative OB ROS                             Anesthesia Physical Anesthesia Plan  ASA: III  Anesthesia Plan: General ETT   Post-op Pain Management:    Induction:   PONV Risk Score and Plan: Ondansetron, Dexamethasone, Treatment may vary due to age or medical condition and Midazolam  Airway Management Planned:   Additional Equipment:   Intra-op Plan:   Post-operative Plan:   Informed Consent: I have reviewed the patients History and Physical, chart, labs and discussed the procedure including the risks, benefits and alternatives for the proposed  anesthesia with the patient or authorized representative who has indicated his/her understanding and acceptance.     Dental Advisory Given  Plan Discussed with: Anesthesiologist  Anesthesia Plan Comments:         Anesthesia Quick Evaluation

## 2019-09-28 NOTE — Anesthesia Preprocedure Evaluation (Signed)
Anesthesia Evaluation  Patient identified by MRN, date of birth, ID band Patient awake    Reviewed: Allergy & Precautions, NPO status , Patient's Chart, lab work & pertinent test results  History of Anesthesia Complications Negative for: history of anesthetic complications  Airway Mallampati: II  TM Distance: >3 FB Neck ROM: Full    Dental  (+) Poor Dentition   Pulmonary neg sleep apnea, neg COPD, Current Smoker and Patient abstained from smoking.,           Cardiovascular hypertension, (-) CAD, (-) Past MI, (-) Cardiac Stents and (-) CABG      Neuro/Psych Seizures - (in setting of EtOH withdrawal),  PSYCHIATRIC DISORDERS Anxiety Depression    GI/Hepatic negative GI ROS, (+) Hepatitis -  Endo/Other  negative endocrine ROSneg diabetes  Renal/GU negative Renal ROS     Musculoskeletal negative musculoskeletal ROS (+)   Abdominal (+) - obese,   Peds  Hematology  (+) anemia ,   Anesthesia Other Findings Past Medical History: 11/2014: Alcohol abuse     Comment:  drinking since age 63. chronic, recurrent. at least 2               detox admits before 2015.  02/1244: Alcoholic hepatitis     Comment:  hepatic steatosis on 11/2014 ultrasound.  No date: Breast cancer [facility])     Comment:  age 45, lump removed from left breast 2007: Chlamydia     Comment:  bacterial vaginosis 09/2011 12/2014: Colitis     Comment:  colonoscopy for diarrhea and abnml CT 01/06/15:               erythmatous TI (path: active ileitis, ? emerging IBD),               rectal erythema (path: mucosal prolapse). Random bx of               normal colon (path unremarkable)  No date: Congenital deafness     Comment:  left ear only.  Also noted in mother and sibling               (unilateral).  No date: Crohn's disease ([place]) initally at age 12: Depression with anxiety 12/2014. : Failure to thrive in adult     Comment:  Malnutrition: n/v, not eating, weight  loss, BMI 14.  s/p              01/11/2015 PEG (Dr Dorna Leitz).  No date: Hypertensive urgency 2010: Irregular heart beat     Comment:  wt/diet related after evaluation 02/2015: Pancreas divisum     Comment:  Type 1 seen on CT.  No date: Pneumothorax, spontaneous, tension No date: Renal disorder 06/2015: Seizure ([place])     Comment:  due to ETOH/benzo withdrawal.  08/2012: Spontaneous pneumothorax     Comment:  right.  chest tube placed.  04/2007: Thrombocytopenia ([place])   Reproductive/Obstetrics                             Anesthesia Physical Anesthesia Plan  ASA: III  Anesthesia Plan: General   Post-op Pain Management:    Induction: Intravenous  PONV Risk Score and Plan: 1 and Ondansetron and Midazolam  Airway Management Planned: Oral ETT  Additional Equipment:   Intra-op Plan:   Post-operative Plan: Extubation in OR  Informed Consent: I have reviewed the patients History and Physical, chart, labs and discussed the procedure including the risks, benefits  and alternatives for the proposed anesthesia with the patient or authorized representative who has indicated his/her understanding and acceptance.     Dental advisory given  Plan Discussed with: CRNA and Anesthesiologist  Anesthesia Plan Comments:         Anesthesia Quick Evaluation

## 2019-09-29 ENCOUNTER — Encounter: Payer: Self-pay | Admitting: *Deleted

## 2019-09-29 DIAGNOSIS — K831 Obstruction of bile duct: Secondary | ICD-10-CM

## 2019-09-29 LAB — COMPREHENSIVE METABOLIC PANEL
ALT: 45 U/L — ABNORMAL HIGH (ref 0–44)
AST: 89 U/L — ABNORMAL HIGH (ref 15–41)
Albumin: 3 g/dL — ABNORMAL LOW (ref 3.5–5.0)
Alkaline Phosphatase: 102 U/L (ref 38–126)
Anion gap: 10 (ref 5–15)
BUN: <5 mg/dL — ABNORMAL LOW (ref 6–20)
CO2: 23 mmol/L (ref 22–32)
Calcium: 8.5 mg/dL — ABNORMAL LOW (ref 8.9–10.3)
Chloride: 105 mmol/L (ref 98–111)
Creatinine, Ser: 0.46 mg/dL (ref 0.44–1.00)
GFR calc Af Amer: >60 mL/min (ref >60)
GFR calc non Af Amer: >60 mL/min (ref >60)
Glucose, Bld: 110 mg/dL — ABNORMAL HIGH (ref 70–99)
Potassium: 4 mmol/L (ref 3.5–5.1)
Sodium: 138 mmol/L (ref 135–145)
Total Bilirubin: 2.6 mg/dL — ABNORMAL HIGH (ref 0.3–1.2)
Total Protein: 6.7 g/dL (ref 6.5–8.1)

## 2019-09-29 LAB — LIPASE, BLOOD: Lipase: 69 U/L — ABNORMAL HIGH (ref 11–51)

## 2019-09-29 MED ORDER — TRAMADOL HCL 50 MG PO TABS
50.0000 mg | ORAL_TABLET | Freq: Three times a day (TID) | ORAL | Status: DC | PRN
  Administered 2019-09-29: 50 mg via ORAL
  Filled 2019-09-29: qty 1

## 2019-09-29 MED ORDER — TRAMADOL HCL 50 MG PO TABS: 50.0000 mg | ORAL_TABLET | Freq: Three times a day (TID) | ORAL | 0 refills | Status: DC | PRN

## 2019-09-29 MED ORDER — ADULT MULTIVITAMIN W/MINERALS CH: 1.0000 | ORAL_TABLET | Freq: Every day | ORAL | 0 refills | Status: DC

## 2019-09-29 NOTE — Discharge Summary (Signed)
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 Alcohol abuse 11/2014   drinking since age 48. chronic, recurrent. at least 2 detox admits before 2015.   Marland Kitchen Alcoholic hepatitis 11/6801   hepatic steatosis on 11/2014 ultrasound.   . Breast cancer [facility])    age 31, lump removed from left breast  . Chlamydia 2007   bacterial vaginosis 09/2011  . Colitis 12/2014   colonoscopy for diarrhea and abnml CT 01/06/15: erythmatous TI (path: active ileitis, ? emerging IBD), rectal erythema (path: mucosal prolapse). Random bx of normal colon (path unremarkable)   . Congenital deafness    left ear only.  Also noted in mother and sibling (unilateral).   . Crohn's disease ([place])   . Depression with anxiety initally at age 46  . Failure to thrive in adult 12/2014.    Malnutrition: n/v, not eating, weight loss, BMI 14.  s/p 01/11/2015 PEG (Dr Dorna Leitz).   . Hypertensive urgency   . Irregular heart beat 2010   wt/diet related after evaluation  . Pancreas divisum 02/2015   Type 1 seen on CT.   Marland Kitchen Pneumothorax, spontaneous, tension   . Renal disorder   . Seizure ([place]) 06/2015   due to ETOH/benzo withdrawal.   . Spontaneous pneumothorax 08/2012   right.  chest tube placed.   . Thrombocytopenia ([place]) 04/2007    HOSPITAL COURSE:  SandraJohnstonis a33 y.o.femalewith a known history of multiple medical problems presented to the emergency room with acute onset of right upper quadrant abdominal and right-sided pain with associated  nausea without vomiting. She denied any fever or chills.  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 Allen Norris-- s/p ERCP  with sphincterotmy of biliary stricture -post procedure LFT's improved. Lipase 69 -FLD -MRCP results noted -recieved IVF - pt is advised to f/u GI if needed.  2. Alcohol abuse.  -on as needed IV Ativan  -pt says she takes 1 can of beer daily. Did not report any other form of ETOH intake -Seems like she has intermittent chronic tremors   3. GERD. -PPI therapy -pepcid OTC after discharge  4. DVT prophylaxis.  -Subcutaneous Lovenox  D/c home. GI informed  Procedures: ERCP Family communication :patient said her family is informed Consults :GI Discharge Disposition :home CODE STATUS: FULL DVT Prophylaxis :SCD Barriers to discharge none  CONSULTS OBTAINED:  Treatment Team:  Lucilla Lame, MD  DRUG ALLERGIES:   Allergies  Allergen Reactions  . Aspirin Shortness Of Breath  . Effexor [Venlafaxine] Anaphylaxis  . Gabapentin Anaphylaxis  . Libritabs [Chlordiazepoxide] Anaphylaxis    Tolerates lorazepam and clonazepam  . Sulfa Antibiotics Anaphylaxis  . Tetracyclines & Related Anaphylaxis  . Trazodone And Nefazodone Anaphylaxis  . Latex Hives and Itching  . Paxil [Paroxetine] Hives and Itching  . Seroquel [Quetiapine Fumarate] Hives and Itching  . Zoloft [Sertraline Hcl] Hives and Itching    DISCHARGE MEDICATIONS:   Allergies  as of 09/29/2019      Reactions   Aspirin Shortness Of Breath   Effexor [venlafaxine] Anaphylaxis   Gabapentin Anaphylaxis   Libritabs [chlordiazepoxide] Anaphylaxis   Tolerates lorazepam and clonazepam   Sulfa Antibiotics Anaphylaxis   Tetracyclines & Related Anaphylaxis   Trazodone And Nefazodone Anaphylaxis   Latex Hives, Itching    Paxil [paroxetine] Hives, Itching   Seroquel [quetiapine Fumarate] Hives, Itching   Zoloft [sertraline Hcl] Hives, Itching      Medication List    STOP taking these medications   cephALEXin 500 MG capsule Commonly known as: KEFLEX     TAKE these medications   albuterol 108 (90 Base) MCG/ACT inhaler Commonly known as: VENTOLIN HFA Inhale 2 puffs into the lungs every 6 (six) hours as needed for wheezing or shortness of breath.   Melatonin 5 MG Tabs Take 5 mg by mouth at bedtime as needed (sleep).   multivitamin with minerals Tabs tablet Take 1 tablet by mouth daily.   traMADol 50 MG tablet Commonly known as: ULTRAM Take 1 tablet (50 mg total) by mouth 3 (three) times daily as needed for moderate pain.       If you experience worsening of your admission symptoms, develop shortness of breath, life threatening emergency, suicidal or homicidal thoughts you must seek medical attention immediately by calling 911 or calling your MD immediately  if symptoms less severe.  You Must read complete instructions/literature along with all the possible adverse reactions/side effects for all the Medicines you take and that have been prescribed to you. Take any new Medicines after you have completely understood and accept all the possible adverse reactions/side effects.   Please note  You were cared for by a hospitalist during your hospital stay. If you have any questions about your discharge medications or the care you received while you were in the hospital after you are discharged, you can call the unit and asked to speak with the hospitalist on call if the hospitalist that took care of you is not available. Once you are discharged, your primary care physician will handle any further medical issues. Please note that NO REFILLS for any discharge medications will be authorized once you are discharged, as it is imperative that you return to your primary care physician (or establish a relationship  with a primary care physician if you do not have one) for your aftercare needs so that they can reassess your need for medications and monitor your lab values. Today   SUBJECTIVE   No new issues. Calm  Mild right UQ pain No vomitng  VITAL SIGNS:  Blood pressure 112/75, pulse 97, temperature 97.9 F (36.6 C), temperature source Oral, resp. rate 18, height 5' 6"  (1.676 m), weight 64.4 kg, SpO2 94 %.  I/O:    Intake/Output Summary (Last 24 hours) at 09/29/2019 0853 Last data filed at 09/28/2019 1300 Gross per 24 hour  Intake 200 ml  Output 300 ml  Net -100 ml    PHYSICAL EXAMINATION:  GENERAL:  34 y.o.-year-old patient lying in the bed with no acute distress.  EYES: Pupils equal, round, reactive to light and accommodation. No scleral icterus.  HEENT: Head atraumatic, normocephalic. Oropharynx and nasopharynx clear.  NECK:  Supple, no jugular venous distention. No thyroid enlargement, no tenderness.  LUNGS: Normal breath sounds bilaterally, no wheezing, rales,rhonchi or crepitation. No use of accessory muscles of respiration.  CARDIOVASCULAR: S1, S2 normal. No murmurs, rubs, or gallops.  ABDOMEN: Soft, non-tender, non-distended. Bowel sounds present. No organomegaly  or mass.  EXTREMITIES: No pedal edema, cyanosis, or clubbing.  NEUROLOGIC: Cranial nerves II through XII are intact. Muscle strength 5/5 in all extremities. Sensation intact. Gait not checked.  PSYCHIATRIC: The patient is alert and oriented x 3.  SKIN: No obvious rash, lesion, or ulcer.   DATA REVIEW:   CBC  Recent Labs  Lab 09/27/19 0443  WBC 8.6  HGB 12.5  HCT 36.7  PLT 131*    Chemistries  Recent Labs  Lab 09/29/19 0425  NA 138  K 4.0  CL 105  CO2 23  GLUCOSE 110*  BUN <5*  CREATININE 0.46  CALCIUM 8.5*  AST 89*  ALT 45*  ALKPHOS 102  BILITOT 2.6*    Microbiology Results   Recent Results (from the past 240 hour(s))  Respiratory Panel by RT PCR (Flu A&B, Covid) - Nasopharyngeal Swab      Status: None   Collection Time: 09/27/19  2:51 AM   Specimen: Nasopharyngeal Swab  Result Value Ref Range Status   SARS Coronavirus 2 by RT PCR NEGATIVE NEGATIVE Final    Comment: (NOTE) SARS-CoV-2 target nucleic acids are NOT DETECTED. The SARS-CoV-2 RNA is generally detectable in upper respiratoy specimens during the acute phase of infection. The lowest concentration of SARS-CoV-2 viral copies this assay can detect is 131 copies/mL. A negative result does not preclude SARS-Cov-2 infection and should not be used as the sole basis for treatment or other patient management decisions. A negative result may occur with  improper specimen collection/handling, submission of specimen other than nasopharyngeal swab, presence of viral mutation(s) within the areas targeted by this assay, and inadequate number of viral copies (<131 copies/mL). A negative result must be combined with clinical observations, patient history, and epidemiological information. The expected result is Negative. Fact Sheet for Patients:  PinkCheek.be Fact Sheet for Healthcare Providers:  GravelBags.it This test is not yet ap proved or cleared by the [place] FDA and  has been authorized for detection and/or diagnosis of SARS-CoV-2 by FDA under an Emergency Use Authorization (EUA). This EUA will remain  in effect (meaning this test can be used) for the duration of the COVID-19 declaration under Section 564(b)(1) of the Act, 21 U.S.C. section 360bbb-3(b)(1), unless the authorization is terminated or revoked sooner.    Influenza A by PCR NEGATIVE NEGATIVE Final   Influenza B by PCR NEGATIVE NEGATIVE Final    Comment: (NOTE) The Xpert Xpress SARS-CoV-2/FLU/RSV assay is intended as an aid in  the diagnosis of influenza from Nasopharyngeal swab specimens and  should not be used as a sole basis for treatment. Nasal washings and  aspirates are unacceptable for  Xpert Xpress SARS-CoV-2/FLU/RSV  testing. Fact Sheet for Patients: PinkCheek.be Fact Sheet for Healthcare Providers: GravelBags.it This test is not yet approved or cleared by the [place] FDA and  has been authorized for detection and/or diagnosis of SARS-CoV-2 by  FDA under an Emergency Use Authorization (EUA). This EUA will remain  in effect (meaning this test can be used) for the duration of the  Covid-19 declaration under Section 564(b)(1) of the Act, 21  U.S.C. section 360bbb-3(b)(1), unless the authorization is  terminated or revoked. Performed at [facility], [address], [place], [place] [address]     RADIOLOGY:  DG C-Arm 1-60 Min-No Report  Result Date: 09/28/2019 Fluoroscopy was utilized by the requesting physician.  No radiographic interpretation.     CODE STATUS:     Code Status Orders  (From admission, onward)  Start     Ordered   09/27/19 0414  Full code  Continuous     09/27/19 0417        Code Status History    Date Active Date Inactive Code Status Order ID Comments User Context   02/08/2018 0534 02/13/2018 1739 Full Code [id number]  Harrie Foreman, MD Inpatient   03/05/2017 0129 03/08/2017 1539 Full Code [id number]  Cristal Ford, DO ED   02/17/2017 0718 02/22/2017 1545 Full Code [id number]  Reubin Milan, MD Inpatient   01/29/2017 2306 02/03/2017 1507 Full Code [id number]  Rise Patience, MD ED   01/28/2017 1102 01/28/2017 2008 Full Code [id number]  Lorayne Bender, PA-C ED   01/18/2017 0634 01/20/2017 1740 Full Code [id number]  Edwin Dada, MD Inpatient   01/15/2017 0323 01/18/2017 0102 Full Code [id number]  Rise Patience, MD ED   12/23/2016 2327 12/25/2016 1610 Full Code [id number]  Vianne Bulls, MD ED   12/11/2016 2034 12/14/2016 1719 Full Code [id number]  Tawni Millers, MD Inpatient   11/11/2016 1903 11/19/2016 1621 Full Code [id number]  Vianne Bulls, MD ED   10/28/2016 2142 10/30/2016 1912 Full Code [id number]  Phillips Grout, MD Inpatient   09/21/2016 0505 09/25/2016 2055 Full Code [id number]  Rise Patience, MD ED   09/20/2016 1413 09/20/2016 1959 Full Code [id number]  Nena Polio, MD ED   08/30/2016 0648 09/04/2016 2259 Full Code [id number]  Toy Baker, MD Inpatient   08/04/2016 0011 08/06/2016 1733 Full Code [id number]  Norval Morton, MD Inpatient   08/03/2016 2251 08/04/2016 0011 Full Code [id number]  Antonietta Breach, PA-C ED   01/25/2016 1539 02/02/2016 2016 Full Code [id number]  Eugenie Filler, MD Inpatient   12/30/2015 2322 01/02/2016 1606 Full Code [id number]  Theressa Millard, MD Inpatient   12/30/2015 1923 12/30/2015 2322 Full Code [id number]  Clayton Bibles, PA-C ED   12/21/2015 0121 12/23/2015 1809 Full Code [id number]  Sylvan Cheese, MD ED   12/20/2015 1940 12/21/2015 0121 Full Code [id number]  Orbie Pyo, MD ED   12/16/2015 0142 12/16/2015 1710 Full Code [id number]  Merryl Hacker, MD ED   12/08/2015 0039 12/10/2015 1718 Full Code [id number]  Fritzi Mandes, MD Inpatient   10/14/2015 2031 10/22/2015 2029 Full Code [id number]  Vaughan Basta, MD Inpatient   10/05/2015 0334 10/05/2015 1753 Full Code [id number]  Lance Coon, MD ED   08/08/2015 1752 08/10/2015 2021 Full Code [id number]  Elmarie Shiley, MD Inpatient   07/09/2015 1811 07/14/2015 1756 Full Code [id number]  Theodis Blaze, MD Inpatient   07/01/2015 0032 07/02/2015 1752 Full Code [id number]  Hower, Aaron Mose, MD ED   06/21/2015 1225 06/22/2015 1608 Full Code [id number]  Epifanio Lesches, MD ED   05/20/2015 1956 05/23/2015 1555 Full Code [id number]  Fritzi Mandes, MD Inpatient   04/16/2015 2121 04/18/2015 1655 Full Code [id number]  Idelle Crouch, MD Inpatient   03/01/2015 2052 03/03/2015 1605 Full Code [id number]  Lytle Butte, MD ED   01/04/2015 1923 01/13/2015 1912 Full Code [id number]  Fritzi Mandes, MD ED   11/24/2014 1821 11/26/2014 1329 Full Code [id number]  Serita Grit, MD  ED   Advance Care Planning Activity       TOTAL TIME TAKING CARE OF THIS PATIENT: 35 minutes.    Fritzi Mandes M.D  [facility]    CC: Primary care physician; Patient, No Pcp Per

## 2019-09-29 NOTE — Discharge Instructions (Signed)
Abstain from drinking alcohol

## 2019-09-29 NOTE — Progress Notes (Signed)
Kristine Macias A and O x4. VSS. Pt tolerating diet well. No complaints of nausea or vomiting. IV removed intact, prescriptions given. Pt voices understanding of discharge instructions with no further questions. Patient discharged via wheelchair with RN  Allergies as of 09/29/2019      Reactions   Aspirin Shortness Of Breath   Effexor [venlafaxine] Anaphylaxis   Gabapentin Anaphylaxis   Libritabs [chlordiazepoxide] Anaphylaxis   Tolerates lorazepam and clonazepam   Sulfa Antibiotics Anaphylaxis   Tetracyclines & Related Anaphylaxis   Trazodone And Nefazodone Anaphylaxis   Latex Hives, Itching   Paxil [paroxetine] Hives, Itching   Seroquel [quetiapine Fumarate] Hives, Itching   Zoloft [sertraline Hcl] Hives, Itching      Medication List    STOP taking these medications   cephALEXin 500 MG capsule Commonly known as: KEFLEX     TAKE these medications   albuterol 108 (90 Base) MCG/ACT inhaler Commonly known as: VENTOLIN HFA Inhale 2 puffs into the lungs every 6 (six) hours as needed for wheezing or shortness of breath.   Melatonin 5 MG Tabs Take 5 mg by mouth at bedtime as needed (sleep).   multivitamin with minerals Tabs tablet Take 1 tablet by mouth daily.   traMADol 50 MG tablet Commonly known as: ULTRAM Take 1 tablet (50 mg total) by mouth 3 (three) times daily as needed for moderate pain.       Vitals:   09/28/19 2034 09/29/19 0509  BP: 122/86 112/75  Pulse: 97 97  Resp: 18 18  Temp: 98.4 F (36.9 C) 97.9 F (36.6 C)  SpO2: 97% 94%    Kristine Macias

## 2019-09-29 NOTE — Progress Notes (Signed)
Nutrition Education Note  RD educated patient today regarding a low fat diet   33 y/o female with h/o etoh abuse, NSTEMI, anxiety, depression, spontaneous pneumothorax, congenital deafness ( L ear), hepatitis, seizures, Crohn's, asthma, CHF, DM, pancreas divisum, sickle cell anemia, FTT requiring PEG tube 2017 (now removed) who is admitted with hyperbilirubinemia r/t biliary papillary stenosis now s/p sphincterotomy and elevated LFTs r/t alcoholic hepatitis.   Spoke with pt via phone. Pt is known to nutrition department from multiple previous admits. Pt reports good appetite an oral intake at baseline. Pt with h/o Crohn's disease; pt reports that she has only had 2 flares and she mainly avoids nuts and seeds.   RD provided "Low Fat Nutrition Therapy" handout from the [employer] via mail. RD also provided a list of low fat vs high fat foods. Reviewed patient's dietary recall. Advised patient to eat multiple small meals and to avoid high fat foods, spicy foods and alcohol. Educated pt on the importance of adequate protein intake needed to preserve lean muscle. Gave patient examples of meals and menu planning.   Teach back method used.  Expect poor compliance.  Body mass index is 22.92 kg/m. Pt meets criteria for normal weight based on current BMI.  Labs and medications reviewed. No further nutrition interventions warranted at this time. RD contact information provided. If additional nutrition issues arise, please re-consult RD.  Pt to discharge today  Koleen Distance MS, RD, [place] Pager #[phone] Office#- [phone] After Hours Pager: [phone]

## 2019-12-10 ENCOUNTER — Other Ambulatory Visit: Payer: Self-pay

## 2019-12-10 ENCOUNTER — Encounter: Payer: Self-pay | Admitting: *Deleted

## 2019-12-10 ENCOUNTER — Emergency Department
Admission: EM | Admit: 2019-12-10 | Discharge: 2019-12-11 | Disposition: A | Discharge status: Home / Self Care | Payer: Medicaid Other | Attending: Student | Admitting: Student

## 2019-12-10 ENCOUNTER — Emergency Department: Payer: Medicaid Other

## 2019-12-10 DIAGNOSIS — R945 Abnormal results of liver function studies: Secondary | ICD-10-CM | POA: Insufficient documentation

## 2019-12-10 DIAGNOSIS — F17210 Nicotine dependence, cigarettes, uncomplicated: Secondary | ICD-10-CM | POA: Diagnosis not present

## 2019-12-10 DIAGNOSIS — F1010 Alcohol abuse, uncomplicated: Secondary | ICD-10-CM

## 2019-12-10 DIAGNOSIS — R112 Nausea with vomiting, unspecified: Secondary | ICD-10-CM | POA: Insufficient documentation

## 2019-12-10 DIAGNOSIS — Z91040 Latex allergy status: Secondary | ICD-10-CM | POA: Diagnosis not present

## 2019-12-10 DIAGNOSIS — R1011 Right upper quadrant pain: Secondary | ICD-10-CM | POA: Diagnosis present

## 2019-12-10 DIAGNOSIS — Z79899 Other long term (current) drug therapy: Secondary | ICD-10-CM | POA: Insufficient documentation

## 2019-12-10 DIAGNOSIS — R7989 Other specified abnormal findings of blood chemistry: Secondary | ICD-10-CM

## 2019-12-10 LAB — LIPASE, BLOOD: Lipase: 32 U/L (ref 11–51)

## 2019-12-10 LAB — CBC
HCT: 47.8 % — ABNORMAL HIGH (ref 36.0–46.0)
Hemoglobin: 16.3 g/dL — ABNORMAL HIGH (ref 12.0–15.0)
MCH: 32.9 pg (ref 26.0–34.0)
MCHC: 34.1 g/dL (ref 30.0–36.0)
MCV: 96.4 fL (ref 80.0–100.0)
Platelets: 147 10*3/uL — ABNORMAL LOW (ref 150–400)
RBC: 4.96 MIL/uL (ref 3.87–5.11)
RDW: 11.6 % (ref 11.5–15.5)
WBC: 7.5 10*3/uL (ref 4.0–10.5)
nRBC: 0 % (ref 0.0–0.2)

## 2019-12-10 LAB — COMPREHENSIVE METABOLIC PANEL
ALT: 263 U/L — ABNORMAL HIGH (ref 0–44)
AST: 491 U/L — ABNORMAL HIGH (ref 15–41)
Albumin: 3.7 g/dL (ref 3.5–5.0)
Alkaline Phosphatase: 148 U/L — ABNORMAL HIGH (ref 38–126)
Anion gap: 14 (ref 5–15)
BUN: 14 mg/dL (ref 6–20)
CO2: 18 mmol/L — ABNORMAL LOW (ref 22–32)
Calcium: 8.9 mg/dL (ref 8.9–10.3)
Chloride: 99 mmol/L (ref 98–111)
Creatinine, Ser: 0.5 mg/dL (ref 0.44–1.00)
GFR calc Af Amer: >60 mL/min (ref >60)
GFR calc non Af Amer: >60 mL/min (ref >60)
Glucose, Bld: 133 mg/dL — ABNORMAL HIGH (ref 70–99)
Potassium: 4.3 mmol/L (ref 3.5–5.1)
Sodium: 131 mmol/L — ABNORMAL LOW (ref 135–145)
Total Bilirubin: 2.2 mg/dL — ABNORMAL HIGH (ref 0.3–1.2)
Total Protein: 7.6 g/dL (ref 6.5–8.1)

## 2019-12-10 LAB — URINALYSIS, COMPLETE (UACMP) WITH MICROSCOPIC
Bilirubin Urine: NEGATIVE
Glucose, UA: NEGATIVE mg/dL
Hgb urine dipstick: NEGATIVE
Ketones, ur: NEGATIVE mg/dL
Leukocytes,Ua: NEGATIVE
Nitrite: NEGATIVE
Protein, ur: NEGATIVE mg/dL
Specific Gravity, Urine: 1.023 (ref 1.005–1.030)
pH: 5 (ref 5.0–8.0)

## 2019-12-10 LAB — POCT PREGNANCY, URINE: Preg Test, Ur: NEGATIVE

## 2019-12-10 MED ORDER — PANTOPRAZOLE SODIUM 40 MG IV SOLR
40.0000 mg | Freq: Once | INTRAVENOUS | Status: AC
  Administered 2019-12-11: 40 mg via INTRAVENOUS
  Filled 2019-12-10: qty 40

## 2019-12-10 MED ORDER — ONDANSETRON HCL 4 MG/2ML IJ SOLN
4.0000 mg | Freq: Once | INTRAMUSCULAR | Status: AC
  Administered 2019-12-11: 4 mg via INTRAVENOUS
  Filled 2019-12-10: qty 2

## 2019-12-10 MED ORDER — SODIUM CHLORIDE 0.9% FLUSH: 3.0000 mL | Freq: Once | INTRAVENOUS | Status: DC

## 2019-12-10 MED ORDER — MORPHINE SULFATE (PF) 4 MG/ML IV SOLN
4.0000 mg | Freq: Once | INTRAVENOUS | Status: AC
  Administered 2019-12-11: 4 mg via INTRAVENOUS
  Filled 2019-12-10: qty 1

## 2019-12-10 NOTE — ED Notes (Signed)
Pt. POC resulted Neg.

## 2019-12-10 NOTE — ED Triage Notes (Signed)
Pt has right side abd pain and right back pain.  Pt reports vomiting blood last night.  Last etoh use was last night.  No urinary sx.  Pt alert.  Speech clear
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
## 2019-12-11 LAB — HEMOGLOBIN AND HEMATOCRIT, BLOOD
HCT: 44.8 % (ref 36.0–46.0)
Hemoglobin: 15.3 g/dL — ABNORMAL HIGH (ref 12.0–15.0)

## 2019-12-11 LAB — TYPE AND SCREEN
ABO/RH(D): O POS
Antibody Screen: NEGATIVE

## 2019-12-11 LAB — PROTIME-INR
INR: 1.3 — ABNORMAL HIGH (ref 0.8–1.2)
Prothrombin Time: 16.5 seconds — ABNORMAL HIGH (ref 11.4–15.2)

## 2019-12-11 MED ORDER — FENTANYL CITRATE (PF) 100 MCG/2ML IJ SOLN
50.0000 ug | Freq: Once | INTRAMUSCULAR | Status: AC
  Administered 2019-12-11: 50 ug via INTRAVENOUS
  Filled 2019-12-11: qty 2

## 2019-12-11 MED ORDER — LORAZEPAM 2 MG/ML IJ SOLN
1.0000 mg | Freq: Once | INTRAMUSCULAR | Status: AC
  Administered 2019-12-11: 1 mg via INTRAVENOUS
  Filled 2019-12-11: qty 1

## 2019-12-11 MED ORDER — THIAMINE HCL 100 MG PO TABS
100.0000 mg | ORAL_TABLET | Freq: Once | ORAL | Status: AC
  Administered 2019-12-11: 03:00:00 100 mg via ORAL
  Filled 2019-12-11: qty 1

## 2019-12-11 MED ORDER — LORAZEPAM 2 MG PO TABS
2.0000 mg | ORAL_TABLET | Freq: Once | ORAL | Status: AC
  Administered 2019-12-11: 2 mg via ORAL
  Filled 2019-12-11: qty 1

## 2019-12-11 MED ORDER — OMEPRAZOLE 40 MG PO CPDR: 40.0000 mg | DELAYED_RELEASE_CAPSULE | Freq: Every day | ORAL | 0 refills | Status: DC

## 2019-12-11 MED ORDER — SODIUM CHLORIDE 0.9 % IV BOLUS
1000.0000 mL | Freq: Once | INTRAVENOUS | Status: AC
  Administered 2019-12-11: 1000 mL via INTRAVENOUS

## 2019-12-11 MED ORDER — FOLIC ACID 1 MG PO TABS
1.0000 mg | ORAL_TABLET | Freq: Once | ORAL | Status: AC
  Administered 2019-12-11: 1 mg via ORAL
  Filled 2019-12-11: qty 1

## 2019-12-11 MED ORDER — LORAZEPAM 1 MG PO TABS: ORAL_TABLET | ORAL | 0 refills | Status: DC

## 2019-12-11 NOTE — ED Notes (Signed)
Pt tolerating crackers and drink.  VSS.

## 2019-12-11 NOTE — ED Notes (Signed)
Pt given crackers, drink and peanut butter for PO challenge per Dr. Joan Mayans' request.

## 2019-12-11 NOTE — Discharge Instructions (Addendum)
Thank you for letting us take care of you in the emergency department today.   Please continue to take any regular, prescribed medications.   New medications we have prescribed:  Omeprazole - to protect your stomach Ativan - to help with withdrawal. As we discussed, if you do resume drinking alcohol, you should immediately STOP taking this medication. Do NOT take this medication and drive or operate heavy machinery.  Take as directed, in a tapered fashion: Day 1: 2 mg (2 tablets) every 8 hours.  Day 2: 2 mg (2 tablets) every 8 hours. Day 3: 1 mg (1 tablet) every 8 hours.  Day 4: 1 mg (1 tablet) every 12 hours. Day 5: 1 mg (1 tablet) at bedtime.    Please follow up with: A  primary care doctor to review your ER visit and follow up on your symptoms. Information/referral to the [facility] is below GI doctor, information below   Please return to the ER for any new or worsening symptoms.

## 2019-12-11 NOTE — ED Notes (Signed)
Pt sleeping. NAD. Waiting on ride.

## 2020-01-07 ENCOUNTER — Encounter: Payer: Self-pay | Admitting: Gastroenterology

## 2020-01-07 ENCOUNTER — Ambulatory Visit (INDEPENDENT_AMBULATORY_CARE_PROVIDER_SITE_OTHER): Payer: Self-pay | Admitting: Gastroenterology

## 2020-01-07 ENCOUNTER — Other Ambulatory Visit: Payer: Self-pay

## 2020-01-07 ENCOUNTER — Other Ambulatory Visit
Admission: RE | Admit: 2020-01-07 | Discharge: 2020-01-07 | Disposition: A | Discharge status: Home / Self Care | Payer: Medicaid Other | Attending: Gastroenterology | Admitting: Gastroenterology

## 2020-01-07 VITALS — BP 126/76 | HR 94 | Temp 97.7°F | Ht 66.0 in | Wt 146.8 lb

## 2020-01-07 DIAGNOSIS — K7030 Alcoholic cirrhosis of liver without ascites: Secondary | ICD-10-CM | POA: Insufficient documentation

## 2020-01-07 LAB — HEPATIC FUNCTION PANEL
ALT: 39 U/L (ref 0–44)
AST: 52 U/L — ABNORMAL HIGH (ref 15–41)
Albumin: 4.1 g/dL (ref 3.5–5.0)
Alkaline Phosphatase: 78 U/L (ref 38–126)
Bilirubin, Direct: 0.1 mg/dL (ref 0.0–0.2)
Indirect Bilirubin: 0.4 mg/dL (ref 0.3–0.9)
Total Bilirubin: 0.5 mg/dL (ref 0.3–1.2)
Total Protein: 8.9 g/dL — ABNORMAL HIGH (ref 6.5–8.1)

## 2020-01-07 NOTE — Progress Notes (Signed)
Primary Care Physician: Patient, No Pcp Per  Primary Gastroenterologist:  Dr. Lucilla Lame  Chief Complaint  Patient presents with  . New Patient (Initial Visit)    Nausea, vomiting, elevated LFT's    HPI: Kristine Macias is a 34 y.o. female here for follow-up with a history of alcoholic liver disease.  The patient was in the hospital and had liver enzymes that showed her AST to be over 400 with her bilirubin being over 2.  The patient states that she started drinking heavily when her father died approximately 5 years ago.  She had imaging with suggestions of a nodular liver compatible with possible cirrhosis.  The patient states that she is still drinking although she believes she has cut down somewhat.  The patient has abdominal pain mostly in the upper abdomen bilaterally.  Past Medical History:  Diagnosis Date  . Alcohol abuse 11/2014   drinking since age 31. chronic, recurrent. at least 2 detox admits before 2015.   Marland Kitchen Alcoholic hepatitis 01/2034   hepatic steatosis on 11/2014 ultrasound.   . Breast cancer [facility])    age 80, lump removed from left breast  . Chlamydia 2007   bacterial vaginosis 09/2011  . Colitis 12/2014   colonoscopy for diarrhea and abnml CT 01/06/15: erythmatous TI (path: active ileitis, ? emerging IBD), rectal erythema (path: mucosal prolapse). Random bx of normal colon (path unremarkable)   . Congenital deafness    left ear only.  Also noted in mother and sibling (unilateral).   . Crohn's disease ([place])   . Depression with anxiety initally at age 34  . Failure to thrive in adult 12/2014.    Malnutrition: n/v, not eating, weight loss, BMI 14.  s/p 01/11/2015 PEG (Dr Dorna Leitz).   . Hypertensive urgency   . Irregular heart beat 2010   wt/diet related after evaluation  . Pancreas divisum 02/2015   Type 1 seen on CT.   Marland Kitchen Pneumothorax, spontaneous, tension   . Renal disorder   . Seizure ([place]) 06/2015   due to ETOH/benzo withdrawal.   . Spontaneous pneumothorax  08/2012   right.  chest tube placed.   . Thrombocytopenia ([place]) 04/2007    Current Outpatient Medications  Medication Sig Dispense Refill  . acetaminophen (TYLENOL) 325 MG tablet Take by mouth every 6 (six) hours as needed for headache.    . albuterol (PROVENTIL HFA;VENTOLIN HFA) 108 (90 Base) MCG/ACT inhaler Inhale 2 puffs into the lungs every 6 (six) hours as needed for wheezing or shortness of breath. 1 Inhaler 2  . Melatonin 5 MG TABS Take 5 mg by mouth at bedtime as needed (sleep).    . Multiple Vitamin (MULTIVITAMIN WITH MINERALS) TABS tablet Take 1 tablet by mouth daily. 30 tablet 0  . omeprazole (PRILOSEC) 40 MG capsule Take 1 capsule (40 mg total) by mouth daily. 30 capsule 0  . traMADol (ULTRAM) 50 MG tablet Take 1 tablet (50 mg total) by mouth 3 (three) times daily as needed for moderate pain. 15 tablet 0  . LORazepam (ATIVAN) 1 MG tablet Day 1: 2 mg (2 tablets) every 8 hours. Day 2: 2 mg (2 tablets) every 8 hours. Day 3: 1 mg (1 tablet) every 8 hours. Day 4: 1 mg (1 tablet) every 12 hours) Day 5: 1 mg (1 tablet) at bedtime. (Patient not taking: Reported on 01/07/2020) 18 tablet 0   No current facility-administered medications for this visit.    Allergies as of 01/07/2020 - Review Complete 01/07/2020  Allergen Reaction Noted  . Aspirin Shortness Of Breath 05/23/2015  . Effexor [venlafaxine] Anaphylaxis 01/13/2015  . Gabapentin Anaphylaxis 05/30/2016  . Libritabs [chlordiazepoxide] Anaphylaxis 02/16/2017  . Quetiapine Hives, Itching, and Rash 01/26/2015  . Sulfa antibiotics Anaphylaxis 08/31/2011  . Tetracyclines & related Anaphylaxis 08/31/2011  . Trazodone and nefazodone Anaphylaxis 01/13/2015  . Latex Hives and Itching 08/31/2011  . Paxil [paroxetine] Hives and Itching 01/13/2015  . Seroquel [quetiapine fumarate] Hives and Itching 01/13/2015  . Zoloft [sertraline hcl] Hives and Itching 01/13/2015    ROS:  General: Negative for anorexia, weight loss, fever, chills,  fatigue, weakness. ENT: Negative for hoarseness, difficulty swallowing , nasal congestion. CV: Negative for chest pain, angina, palpitations, dyspnea on exertion, peripheral edema.  Respiratory: Negative for dyspnea at rest, dyspnea on exertion, cough, sputum, wheezing.  GI: See history of present illness. GU:  Negative for dysuria, hematuria, urinary incontinence, urinary frequency, nocturnal urination.  Endo: Negative for unusual weight change.    Physical Examination:   BP 126/76   Pulse 94   Temp 97.7 F (36.5 C) (Temporal)   Ht 5' 6"  (1.676 m)   Wt 146 lb 12.8 oz (66.6 kg)   BMI 23.69 kg/m   General: Well-nourished, well-developed in no acute distress.  Eyes: No icterus. Conjunctivae pink. Lungs: Clear to auscultation bilaterally. Non-labored. Heart: Regular rate and rhythm, no murmurs rubs or gallops.  Abdomen: Bowel sounds are normal, positive tenderness to 1 finger palpation while flexing the abdominal wall muscles nondistended, no hepatosplenomegaly or masses, no abdominal bruits or hernia , no rebound or guarding.   Extremities: No lower extremity edema. No clubbing or deformities. Neuro: Alert and oriented x 3.  Grossly intact. Skin: Warm and dry, no jaundice.   Psych: Alert and cooperative, normal mood and affect.  Labs:    Imaging Studies: US ABDOMEN LIMITED RUQ  Result Date: 12/11/2019 CLINICAL DATA:  Elevated LFT EXAM: ULTRASOUND ABDOMEN LIMITED RIGHT UPPER QUADRANT COMPARISON:  MRI 09/27/2019, ultrasound 09/26/2019 FINDINGS: Gallbladder: Dilated gallbladder without shadowing stone. Normal wall thickness. Negative sonographic [place]. Common bile duct: Diameter: Dilated extrahepatic bile duct up to 9 mm. Liver: Liver is echogenic. 9 mm cyst in the left hepatic lobe. Questionable contour nodularity of the liver portal vein is patent on color Doppler imaging with normal direction of blood flow towards the liver. Other: None. IMPRESSION: 1. Dilated gallbladder without  shadowing stone 2. Dilatation of the extrahepatic common bile duct without increased since prior ultrasound from 09/26/2019. 3. Echogenic liver consistent with steatosis and or hepatocellular disease. Questionable contour nodularity as may be seen with early changes of cirrhosis. Electronically Signed   By: Donavan Foil M.D.   On: 12/11/2019 00:58    Assessment and Plan:   Garlene Apperson is a 34 y.o. y/o female who comes in with a history of alcohol abuse with a recent episode of alcoholic hepatitis.  The patient also had an ERCP with a sphincterotomy for a dilated common bile duct with a distal CBD stricture.  The patient has been told that she will need to stop drinking due to her advanced liver damage.  The patient will also have her lab sent off for other possible causes of abnormal liver enzymes.  The patient has also been told to follow-up with me in the next 3 months to see how she is progressing.  The patient has been explained the plan and agrees with it.     Lucilla Lame, MD. Marval Regal    Note: This dictation was  prepared with Dragon dictation along with smaller phrase technology. Any transcriptional errors that result from this process are unintentional.

## 2020-01-08 ENCOUNTER — Telehealth: Payer: Self-pay

## 2020-01-08 LAB — HEPATITIS B SURFACE ANTIGEN: Hepatitis B Surface Ag: NONREACTIVE

## 2020-01-08 LAB — ALPHA-1-ANTITRYPSIN: A-1 Antitrypsin, Ser: 181 mg/dL (ref 100–188)

## 2020-01-08 LAB — MITOCHONDRIAL ANTIBODIES: Mitochondrial M2 Ab, IgG: <20 Units (ref 0.0–20.0)

## 2020-01-08 LAB — HEPATITIS A ANTIBODY, TOTAL: hep A Total Ab: NONREACTIVE

## 2020-01-08 LAB — HEPATITIS B SURFACE ANTIBODY,QUALITATIVE: Hep B S Ab: REACTIVE — AB

## 2020-01-08 LAB — ANTI-SMOOTH MUSCLE ANTIBODY, IGG: F-Actin IgG: 5 Units (ref 0–19)

## 2020-01-08 LAB — ANA: Anti Nuclear Antibody (ANA): NEGATIVE

## 2020-01-08 NOTE — Telephone Encounter (Signed)
Tried contacting pt but voicemail box was full.

## 2020-01-08 NOTE — Telephone Encounter (Signed)
-----   Message from Lucilla Lame, MD sent at 01/07/2020  6:15 PM EDT ----- Let the patient know that her liver enzymes are still elevated but significantly lower than they were in the hospital and she should stick to our plan of stopping her alcohol use.

## 2020-01-10 LAB — HEPATITIS C GENOTYPE

## 2020-01-11 LAB — CERULOPLASMIN: Ceruloplasmin: 41.8 mg/dL — ABNORMAL HIGH (ref 19.0–39.0)

## 2020-01-12 NOTE — Telephone Encounter (Signed)
Pt notified of lab results

## 2020-01-14 ENCOUNTER — Telehealth: Payer: Self-pay

## 2020-01-14 LAB — AFP TUMOR MARKER: AFP, Serum, Tumor Marker: 8 ng/mL (ref 0.0–8.3)

## 2020-01-14 NOTE — Telephone Encounter (Signed)
-----   Message from Lucilla Lame, MD sent at 01/14/2020  7:26 AM EDT ----- Let the patient know the tumor marker was negative.

## 2020-01-14 NOTE — Telephone Encounter (Signed)
Pt notified of lab results

## 2020-01-18 ENCOUNTER — Telehealth: Payer: Self-pay

## 2020-01-18 NOTE — Telephone Encounter (Signed)
-----   Message from Lucilla Lame, MD sent at 01/17/2020  7:59 AM EDT ----- Let the patient know that she will need a vaccination for hepatitis A but the rest of her liver tests did not show any other cause except the alcohol for the liver damage.

## 2020-01-18 NOTE — Telephone Encounter (Signed)
Pt notified of lab results

## 2020-04-11 ENCOUNTER — Ambulatory Visit: Payer: Self-pay | Admitting: Gastroenterology

## 2020-04-11 ENCOUNTER — Encounter: Payer: Self-pay | Admitting: *Deleted

## 2020-07-01 ENCOUNTER — Inpatient Hospital Stay
Admission: EM | Admit: 2020-07-01 | Discharge: 2020-07-07 | DRG: 896 | Disposition: A | Discharge status: Home / Self Care | Payer: Medicaid Other | Attending: Internal Medicine | Admitting: Internal Medicine

## 2020-07-01 ENCOUNTER — Other Ambulatory Visit: Payer: Self-pay

## 2020-07-01 ENCOUNTER — Encounter: Payer: Self-pay | Admitting: Emergency Medicine

## 2020-07-01 DIAGNOSIS — R109 Unspecified abdominal pain: Secondary | ICD-10-CM

## 2020-07-01 DIAGNOSIS — D7589 Other specified diseases of blood and blood-forming organs: Secondary | ICD-10-CM | POA: Diagnosis present

## 2020-07-01 DIAGNOSIS — Z823 Family history of stroke: Secondary | ICD-10-CM

## 2020-07-01 DIAGNOSIS — Z882 Allergy status to sulfonamides status: Secondary | ICD-10-CM

## 2020-07-01 DIAGNOSIS — Z91040 Latex allergy status: Secondary | ICD-10-CM

## 2020-07-01 DIAGNOSIS — Z818 Family history of other mental and behavioral disorders: Secondary | ICD-10-CM

## 2020-07-01 DIAGNOSIS — D6959 Other secondary thrombocytopenia: Secondary | ICD-10-CM | POA: Diagnosis not present

## 2020-07-01 DIAGNOSIS — K5090 Crohn's disease, unspecified, without complications: Secondary | ICD-10-CM | POA: Diagnosis present

## 2020-07-01 DIAGNOSIS — K7010 Alcoholic hepatitis without ascites: Secondary | ICD-10-CM | POA: Diagnosis present

## 2020-07-01 DIAGNOSIS — G4089 Other seizures: Secondary | ICD-10-CM | POA: Diagnosis present

## 2020-07-01 DIAGNOSIS — F17290 Nicotine dependence, other tobacco product, uncomplicated: Secondary | ICD-10-CM | POA: Diagnosis present

## 2020-07-01 DIAGNOSIS — Z886 Allergy status to analgesic agent status: Secondary | ICD-10-CM

## 2020-07-01 DIAGNOSIS — Z853 Personal history of malignant neoplasm of breast: Secondary | ICD-10-CM

## 2020-07-01 DIAGNOSIS — R0902 Hypoxemia: Secondary | ICD-10-CM

## 2020-07-01 DIAGNOSIS — Z803 Family history of malignant neoplasm of breast: Secondary | ICD-10-CM

## 2020-07-01 DIAGNOSIS — G9341 Metabolic encephalopathy: Secondary | ICD-10-CM | POA: Diagnosis present

## 2020-07-01 DIAGNOSIS — F10288 Alcohol dependence with other alcohol-induced disorder: Secondary | ICD-10-CM | POA: Diagnosis present

## 2020-07-01 DIAGNOSIS — Z881 Allergy status to other antibiotic agents status: Secondary | ICD-10-CM

## 2020-07-01 DIAGNOSIS — K2970 Gastritis, unspecified, without bleeding: Secondary | ICD-10-CM | POA: Diagnosis present

## 2020-07-01 DIAGNOSIS — Z888 Allergy status to other drugs, medicaments and biological substances status: Secondary | ICD-10-CM

## 2020-07-01 DIAGNOSIS — E876 Hypokalemia: Secondary | ICD-10-CM | POA: Diagnosis present

## 2020-07-01 DIAGNOSIS — R000 Tachycardia, unspecified: Secondary | ICD-10-CM | POA: Diagnosis present

## 2020-07-01 DIAGNOSIS — F10231 Alcohol dependence with withdrawal delirium: Principal | ICD-10-CM | POA: Diagnosis present

## 2020-07-01 DIAGNOSIS — R443 Hallucinations, unspecified: Secondary | ICD-10-CM | POA: Diagnosis present

## 2020-07-01 DIAGNOSIS — Z801 Family history of malignant neoplasm of trachea, bronchus and lung: Secondary | ICD-10-CM

## 2020-07-01 DIAGNOSIS — Z20822 Contact with and (suspected) exposure to covid-19: Secondary | ICD-10-CM | POA: Diagnosis present

## 2020-07-01 DIAGNOSIS — I10 Essential (primary) hypertension: Secondary | ICD-10-CM | POA: Diagnosis present

## 2020-07-01 DIAGNOSIS — K7030 Alcoholic cirrhosis of liver without ascites: Secondary | ICD-10-CM

## 2020-07-01 DIAGNOSIS — F10939 Alcohol use, unspecified with withdrawal, unspecified: Secondary | ICD-10-CM

## 2020-07-01 DIAGNOSIS — R112 Nausea with vomiting, unspecified: Secondary | ICD-10-CM

## 2020-07-01 DIAGNOSIS — F419 Anxiety disorder, unspecified: Secondary | ICD-10-CM | POA: Diagnosis present

## 2020-07-01 DIAGNOSIS — K529 Noninfective gastroenteritis and colitis, unspecified: Secondary | ICD-10-CM

## 2020-07-01 DIAGNOSIS — F10239 Alcohol dependence with withdrawal, unspecified: Secondary | ICD-10-CM | POA: Diagnosis present

## 2020-07-01 DIAGNOSIS — H9042 Sensorineural hearing loss, unilateral, left ear, with unrestricted hearing on the contralateral side: Secondary | ICD-10-CM | POA: Diagnosis present

## 2020-07-01 DIAGNOSIS — K920 Hematemesis: Secondary | ICD-10-CM

## 2020-07-01 DIAGNOSIS — Z8659 Personal history of other mental and behavioral disorders: Secondary | ICD-10-CM

## 2020-07-01 DIAGNOSIS — F1994 Other psychoactive substance use, unspecified with psychoactive substance-induced mood disorder: Secondary | ICD-10-CM

## 2020-07-01 DIAGNOSIS — Z8349 Family history of other endocrine, nutritional and metabolic diseases: Secondary | ICD-10-CM

## 2020-07-01 DIAGNOSIS — K922 Gastrointestinal hemorrhage, unspecified: Secondary | ICD-10-CM | POA: Diagnosis present

## 2020-07-01 DIAGNOSIS — K760 Fatty (change of) liver, not elsewhere classified: Secondary | ICD-10-CM | POA: Diagnosis present

## 2020-07-01 DIAGNOSIS — F32A Depression, unspecified: Secondary | ICD-10-CM | POA: Diagnosis present

## 2020-07-01 LAB — CBC
HCT: 46.3 % — ABNORMAL HIGH (ref 36.0–46.0)
Hemoglobin: 16.6 g/dL — ABNORMAL HIGH (ref 12.0–15.0)
MCH: 35.5 pg — ABNORMAL HIGH (ref 26.0–34.0)
MCHC: 35.9 g/dL (ref 30.0–36.0)
MCV: 98.9 fL (ref 80.0–100.0)
Platelets: 234 10*3/uL (ref 150–400)
RBC: 4.68 MIL/uL (ref 3.87–5.11)
RDW: 13 % (ref 11.5–15.5)
WBC: 7.4 10*3/uL (ref 4.0–10.5)
nRBC: 0 % (ref 0.0–0.2)

## 2020-07-01 LAB — COMPREHENSIVE METABOLIC PANEL
ALT: 104 U/L — ABNORMAL HIGH (ref 0–44)
AST: 245 U/L — ABNORMAL HIGH (ref 15–41)
Albumin: 4.2 g/dL (ref 3.5–5.0)
Alkaline Phosphatase: 132 U/L — ABNORMAL HIGH (ref 38–126)
Anion gap: 19 — ABNORMAL HIGH (ref 5–15)
BUN: 5 mg/dL — ABNORMAL LOW (ref 6–20)
CO2: 23 mmol/L (ref 22–32)
Calcium: 8 mg/dL — ABNORMAL LOW (ref 8.9–10.3)
Chloride: 97 mmol/L — ABNORMAL LOW (ref 98–111)
Creatinine, Ser: 0.55 mg/dL (ref 0.44–1.00)
GFR, Estimated: >60 mL/min (ref >60)
Glucose, Bld: 202 mg/dL — ABNORMAL HIGH (ref 70–99)
Potassium: 2.3 mmol/L — CL (ref 3.5–5.1)
Sodium: 139 mmol/L (ref 135–145)
Total Bilirubin: 0.9 mg/dL (ref 0.3–1.2)
Total Protein: 8.3 g/dL — ABNORMAL HIGH (ref 6.5–8.1)

## 2020-07-01 LAB — URINALYSIS, COMPLETE (UACMP) WITH MICROSCOPIC
Bilirubin Urine: NEGATIVE
Glucose, UA: 50 mg/dL — AB
Ketones, ur: NEGATIVE mg/dL
Leukocytes,Ua: NEGATIVE
Nitrite: NEGATIVE
Protein, ur: 100 mg/dL — AB
Specific Gravity, Urine: 1.011 (ref 1.005–1.030)
pH: 6 (ref 5.0–8.0)

## 2020-07-01 LAB — POC URINE PREG, ED: Preg Test, Ur: NEGATIVE

## 2020-07-01 LAB — LIPASE, BLOOD: Lipase: 30 U/L (ref 11–51)

## 2020-07-01 LAB — MAGNESIUM: Magnesium: 2 mg/dL (ref 1.7–2.4)

## 2020-07-01 MED ORDER — ALUM & MAG HYDROXIDE-SIMETH 200-200-20 MG/5ML PO SUSP
30.0000 mL | Freq: Once | ORAL | Status: AC
  Administered 2020-07-01: 30 mL via ORAL
  Filled 2020-07-01: qty 30

## 2020-07-01 MED ORDER — SODIUM CHLORIDE 0.9 % IV BOLUS
1000.0000 mL | Freq: Once | INTRAVENOUS | Status: AC
  Administered 2020-07-02: 1000 mL via INTRAVENOUS

## 2020-07-01 MED ORDER — ONDANSETRON 4 MG PO TBDP
4.0000 mg | ORAL_TABLET | Freq: Once | ORAL | Status: AC
  Administered 2020-07-01: 4 mg via ORAL
  Filled 2020-07-01: qty 1

## 2020-07-01 MED ORDER — HALOPERIDOL LACTATE 5 MG/ML IJ SOLN
5.0000 mg | Freq: Once | INTRAMUSCULAR | Status: AC
  Administered 2020-07-02: 5 mg via INTRAVENOUS
  Filled 2020-07-01: qty 1

## 2020-07-01 MED ORDER — LIDOCAINE VISCOUS HCL 2 % MT SOLN
15.0000 mL | Freq: Once | OROMUCOSAL | Status: AC
  Administered 2020-07-01: 15 mL via ORAL
  Filled 2020-07-01: qty 15

## 2020-07-01 MED ORDER — POTASSIUM CHLORIDE CRYS ER 20 MEQ PO TBCR
40.0000 meq | EXTENDED_RELEASE_TABLET | Freq: Once | ORAL | Status: AC
  Administered 2020-07-01: 40 meq via ORAL
  Filled 2020-07-01: qty 2

## 2020-07-01 NOTE — ED Notes (Signed)
Pt outside to smoke att

## 2020-07-01 NOTE — ED Notes (Signed)
Pt informed this RN that "she believes she could be in withdrawal from alcohol d/t not being able to hold anything down". Pt states her last drink was at 0500 this AM.  Pt states "I was just watching TV and started to see spots". Pt states she is hallucinating currently.

## 2020-07-01 NOTE — ED Notes (Signed)
Lab called for MAG add on

## 2020-07-01 NOTE — ED Triage Notes (Signed)
Pt to ED via POV c/o vomiting and abdominal pain. Pt states that she is supposed to be seeing a GI specialist on Monday for for fatty liver. Pt states that her symptoms started 3 days ago. Pt has not been abel to eat or keep anything down. Pt is in NAD.

## 2020-07-01 NOTE — ED Notes (Signed)
Pt "walking outside"
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
## 2020-07-01 NOTE — ED Notes (Signed)
Pt reports emesis episode outside

## 2020-07-01 NOTE — ED Notes (Signed)
CRITICAL LAB: POTASSIUM is 2.3, [facility], Dr. Archie Balboa notified, orders received

## 2020-07-01 NOTE — ED Notes (Signed)
Pt out to smoke again

## 2020-07-01 NOTE — ED Notes (Signed)
pt at first RN desk; reports emesis better; meds pulled by michele, RN, given; pt reports nausea improved

## 2020-07-01 NOTE — ED Notes (Signed)
Pt assisted to stretcher by this RN. Pt states she is having 11/10 pain while pointing to L flank. Pt states pain is bilaterally and states it is throbbing.  EDP made aware at this time.

## 2020-07-01 NOTE — ED Notes (Signed)
Pt into hallway to inform Mac, RN that she has vomited the GI cocktail. EDP made aware.  This RN attempted IV start x2. IV team consult ordered d/t difficult start.

## 2020-07-02 ENCOUNTER — Inpatient Hospital Stay: Payer: Medicaid Other

## 2020-07-02 ENCOUNTER — Emergency Department: Payer: Medicaid Other

## 2020-07-02 ENCOUNTER — Encounter: Payer: Self-pay | Admitting: Radiology

## 2020-07-02 DIAGNOSIS — K922 Gastrointestinal hemorrhage, unspecified: Secondary | ICD-10-CM

## 2020-07-02 DIAGNOSIS — R748 Abnormal levels of other serum enzymes: Secondary | ICD-10-CM

## 2020-07-02 DIAGNOSIS — Z886 Allergy status to analgesic agent status: Secondary | ICD-10-CM | POA: Diagnosis not present

## 2020-07-02 DIAGNOSIS — K7030 Alcoholic cirrhosis of liver without ascites: Secondary | ICD-10-CM | POA: Diagnosis present

## 2020-07-02 DIAGNOSIS — Z888 Allergy status to other drugs, medicaments and biological substances status: Secondary | ICD-10-CM | POA: Diagnosis not present

## 2020-07-02 DIAGNOSIS — D7589 Other specified diseases of blood and blood-forming organs: Secondary | ICD-10-CM | POA: Diagnosis present

## 2020-07-02 DIAGNOSIS — K7010 Alcoholic hepatitis without ascites: Secondary | ICD-10-CM | POA: Diagnosis present

## 2020-07-02 DIAGNOSIS — Z91040 Latex allergy status: Secondary | ICD-10-CM | POA: Diagnosis not present

## 2020-07-02 DIAGNOSIS — R443 Hallucinations, unspecified: Secondary | ICD-10-CM | POA: Diagnosis present

## 2020-07-02 DIAGNOSIS — E876 Hypokalemia: Secondary | ICD-10-CM

## 2020-07-02 DIAGNOSIS — F10230 Alcohol dependence with withdrawal, uncomplicated: Secondary | ICD-10-CM | POA: Diagnosis not present

## 2020-07-02 DIAGNOSIS — G9341 Metabolic encephalopathy: Secondary | ICD-10-CM | POA: Diagnosis present

## 2020-07-02 DIAGNOSIS — F10288 Alcohol dependence with other alcohol-induced disorder: Secondary | ICD-10-CM | POA: Diagnosis present

## 2020-07-02 DIAGNOSIS — Z882 Allergy status to sulfonamides status: Secondary | ICD-10-CM | POA: Diagnosis not present

## 2020-07-02 DIAGNOSIS — R109 Unspecified abdominal pain: Secondary | ICD-10-CM | POA: Diagnosis not present

## 2020-07-02 DIAGNOSIS — F419 Anxiety disorder, unspecified: Secondary | ICD-10-CM | POA: Diagnosis present

## 2020-07-02 DIAGNOSIS — Z853 Personal history of malignant neoplasm of breast: Secondary | ICD-10-CM | POA: Diagnosis not present

## 2020-07-02 DIAGNOSIS — Z881 Allergy status to other antibiotic agents status: Secondary | ICD-10-CM | POA: Diagnosis not present

## 2020-07-02 DIAGNOSIS — K529 Noninfective gastroenteritis and colitis, unspecified: Secondary | ICD-10-CM | POA: Diagnosis present

## 2020-07-02 DIAGNOSIS — K5090 Crohn's disease, unspecified, without complications: Secondary | ICD-10-CM | POA: Diagnosis present

## 2020-07-02 DIAGNOSIS — R1084 Generalized abdominal pain: Secondary | ICD-10-CM | POA: Diagnosis not present

## 2020-07-02 DIAGNOSIS — F10239 Alcohol dependence with withdrawal, unspecified: Secondary | ICD-10-CM | POA: Diagnosis not present

## 2020-07-02 DIAGNOSIS — K920 Hematemesis: Secondary | ICD-10-CM | POA: Diagnosis present

## 2020-07-02 DIAGNOSIS — G4089 Other seizures: Secondary | ICD-10-CM | POA: Diagnosis present

## 2020-07-02 DIAGNOSIS — Z20822 Contact with and (suspected) exposure to covid-19: Secondary | ICD-10-CM | POA: Diagnosis present

## 2020-07-02 DIAGNOSIS — I10 Essential (primary) hypertension: Secondary | ICD-10-CM | POA: Diagnosis present

## 2020-07-02 DIAGNOSIS — H9042 Sensorineural hearing loss, unilateral, left ear, with unrestricted hearing on the contralateral side: Secondary | ICD-10-CM | POA: Diagnosis present

## 2020-07-02 DIAGNOSIS — F10231 Alcohol dependence with withdrawal delirium: Secondary | ICD-10-CM | POA: Diagnosis present

## 2020-07-02 DIAGNOSIS — F32A Depression, unspecified: Secondary | ICD-10-CM | POA: Diagnosis present

## 2020-07-02 DIAGNOSIS — R197 Diarrhea, unspecified: Secondary | ICD-10-CM

## 2020-07-02 LAB — GASTROINTESTINAL PANEL BY PCR, STOOL (REPLACES STOOL CULTURE)

## 2020-07-02 LAB — AMMONIA: Ammonia: 21 umol/L (ref 9–35)

## 2020-07-02 LAB — CBC
HCT: 40.2 % (ref 36.0–46.0)
Hemoglobin: 14.3 g/dL (ref 12.0–15.0)
MCH: 35 pg — ABNORMAL HIGH (ref 26.0–34.0)
MCHC: 35.6 g/dL (ref 30.0–36.0)
MCV: 98.5 fL (ref 80.0–100.0)
Platelets: 168 10*3/uL (ref 150–400)
RBC: 4.08 MIL/uL (ref 3.87–5.11)
RDW: 13.2 % (ref 11.5–15.5)
WBC: 8 10*3/uL (ref 4.0–10.5)
nRBC: 0 % (ref 0.0–0.2)

## 2020-07-02 LAB — C DIFFICILE QUICK SCREEN W PCR REFLEX
C Diff antigen: NEGATIVE
C Diff interpretation: NOT DETECTED
C Diff toxin: NEGATIVE

## 2020-07-02 LAB — BASIC METABOLIC PANEL
Anion gap: 14 (ref 5–15)
BUN: 6 mg/dL (ref 6–20)
CO2: 21 mmol/L — ABNORMAL LOW (ref 22–32)
Calcium: 6.7 mg/dL — ABNORMAL LOW (ref 8.9–10.3)
Chloride: 106 mmol/L (ref 98–111)
Creatinine, Ser: 0.49 mg/dL (ref 0.44–1.00)
GFR, Estimated: >60 mL/min (ref >60)
Glucose, Bld: 113 mg/dL — ABNORMAL HIGH (ref 70–99)
Potassium: 3.3 mmol/L — ABNORMAL LOW (ref 3.5–5.1)
Sodium: 141 mmol/L (ref 135–145)

## 2020-07-02 LAB — COMPREHENSIVE METABOLIC PANEL
ALT: 86 U/L — ABNORMAL HIGH (ref 0–44)
AST: 211 U/L — ABNORMAL HIGH (ref 15–41)
Albumin: 3.4 g/dL — ABNORMAL LOW (ref 3.5–5.0)
Alkaline Phosphatase: 104 U/L (ref 38–126)
Anion gap: 13 (ref 5–15)
BUN: <5 mg/dL — ABNORMAL LOW (ref 6–20)
CO2: 21 mmol/L — ABNORMAL LOW (ref 22–32)
Calcium: 6.7 mg/dL — ABNORMAL LOW (ref 8.9–10.3)
Chloride: 106 mmol/L (ref 98–111)
Creatinine, Ser: 0.45 mg/dL (ref 0.44–1.00)
GFR, Estimated: >60 mL/min (ref >60)
Glucose, Bld: 113 mg/dL — ABNORMAL HIGH (ref 70–99)
Potassium: 3.4 mmol/L — ABNORMAL LOW (ref 3.5–5.1)
Sodium: 140 mmol/L (ref 135–145)
Total Bilirubin: 1 mg/dL (ref 0.3–1.2)
Total Protein: 6.5 g/dL (ref 6.5–8.1)

## 2020-07-02 LAB — HEMOGLOBIN AND HEMATOCRIT, BLOOD
HCT: 37.1 % (ref 36.0–46.0)
Hemoglobin: 12.6 g/dL (ref 12.0–15.0)

## 2020-07-02 LAB — TSH: TSH: 3.13 u[IU]/mL (ref 0.350–4.500)

## 2020-07-02 LAB — RESPIRATORY PANEL BY RT PCR (FLU A&B, COVID)
Influenza A by PCR: NEGATIVE
Influenza B by PCR: NEGATIVE
SARS Coronavirus 2 by RT PCR: NEGATIVE

## 2020-07-02 LAB — MAGNESIUM: Magnesium: 1.6 mg/dL — ABNORMAL LOW (ref 1.7–2.4)

## 2020-07-02 LAB — PHOSPHORUS: Phosphorus: 2.8 mg/dL (ref 2.5–4.6)

## 2020-07-02 MED ORDER — LORAZEPAM 2 MG/ML IJ SOLN
1.0000 mg | INTRAMUSCULAR | Status: AC | PRN
  Administered 2020-07-02: 3 mg via INTRAVENOUS
  Administered 2020-07-02: 2 mg via INTRAVENOUS
  Administered 2020-07-02 (×2): 3 mg via INTRAVENOUS
  Administered 2020-07-03 (×2): 2 mg via INTRAVENOUS
  Administered 2020-07-03: 3 mg via INTRAVENOUS
  Administered 2020-07-03 (×2): 2 mg via INTRAVENOUS
  Administered 2020-07-03 (×2): 4 mg via INTRAVENOUS
  Administered 2020-07-03: 2 mg via INTRAVENOUS
  Filled 2020-07-02: qty 2
  Filled 2020-07-02 (×2): qty 1
  Filled 2020-07-02 (×2): qty 2
  Filled 2020-07-02: qty 1
  Filled 2020-07-02 (×2): qty 2
  Filled 2020-07-02: qty 1
  Filled 2020-07-02: qty 2
  Filled 2020-07-02 (×2): qty 1

## 2020-07-02 MED ORDER — THIAMINE HCL 100 MG PO TABS: 100.0000 mg | ORAL_TABLET | Freq: Every day | ORAL | Status: DC

## 2020-07-02 MED ORDER — POTASSIUM CHLORIDE 20 MEQ PO PACK
40.0000 meq | PACK | Freq: Once | ORAL | Status: AC
  Administered 2020-07-02: 40 meq via ORAL
  Filled 2020-07-02: qty 2

## 2020-07-02 MED ORDER — LORAZEPAM 2 MG/ML IJ SOLN
2.0000 mg | Freq: Once | INTRAMUSCULAR | Status: AC
  Administered 2020-07-02: 2 mg via INTRAVENOUS
  Filled 2020-07-02: qty 1

## 2020-07-02 MED ORDER — IOHEXOL 350 MG/ML SOLN
100.0000 mL | Freq: Once | INTRAVENOUS | Status: AC | PRN
  Administered 2020-07-02: 100 mL via INTRAVENOUS

## 2020-07-02 MED ORDER — ONDANSETRON HCL 4 MG PO TABS
4.0000 mg | ORAL_TABLET | Freq: Four times a day (QID) | ORAL | Status: DC | PRN
  Administered 2020-07-05 – 2020-07-06 (×4): 4 mg via ORAL
  Filled 2020-07-02 (×4): qty 1

## 2020-07-02 MED ORDER — MELATONIN 5 MG PO TABS
5.0000 mg | ORAL_TABLET | Freq: Every evening | ORAL | Status: DC | PRN
  Administered 2020-07-03 – 2020-07-06 (×3): 5 mg via ORAL
  Filled 2020-07-02 (×3): qty 1

## 2020-07-02 MED ORDER — TRAMADOL HCL 50 MG PO TABS: 50.0000 mg | ORAL_TABLET | Freq: Three times a day (TID) | ORAL | Status: DC | PRN

## 2020-07-02 MED ORDER — LORAZEPAM 1 MG PO TABS
1.0000 mg | ORAL_TABLET | ORAL | Status: AC | PRN
  Administered 2020-07-04 (×3): 1 mg via ORAL
  Administered 2020-07-04 (×2): 3 mg via ORAL
  Administered 2020-07-04 (×2): 2 mg via ORAL
  Administered 2020-07-04 – 2020-07-05 (×2): 1 mg via ORAL
  Filled 2020-07-02 (×5): qty 1
  Filled 2020-07-02: qty 2
  Filled 2020-07-02: qty 3
  Filled 2020-07-02: qty 2
  Filled 2020-07-02: qty 3

## 2020-07-02 MED ORDER — LACTATED RINGERS IV BOLUS
500.0000 mL | Freq: Once | INTRAVENOUS | Status: AC
  Administered 2020-07-02: 500 mL via INTRAVENOUS

## 2020-07-02 MED ORDER — POTASSIUM CHLORIDE 10 MEQ/100ML IV SOLN
10.0000 meq | INTRAVENOUS | Status: AC
  Administered 2020-07-02 (×2): 10 meq via INTRAVENOUS
  Filled 2020-07-02 (×3): qty 100

## 2020-07-02 MED ORDER — ZOLPIDEM TARTRATE 5 MG PO TABS: 5.0000 mg | ORAL_TABLET | Freq: Every evening | ORAL | Status: DC | PRN

## 2020-07-02 MED ORDER — ACETAMINOPHEN 325 MG PO TABS: 650.0000 mg | ORAL_TABLET | Freq: Four times a day (QID) | ORAL | Status: DC | PRN

## 2020-07-02 MED ORDER — PIPERACILLIN-TAZOBACTAM 3.375 G IVPB 30 MIN
3.3750 g | Freq: Four times a day (QID) | INTRAVENOUS | Status: DC
Start: 2020-07-02 — End: 2020-07-02

## 2020-07-02 MED ORDER — MAGNESIUM SULFATE 50 % IJ SOLN
3.0000 g | Freq: Once | INTRAVENOUS | Status: AC
  Administered 2020-07-02: 3 g via INTRAVENOUS
  Filled 2020-07-02: qty 6

## 2020-07-02 MED ORDER — ADULT MULTIVITAMIN W/MINERALS CH: 1.0000 | ORAL_TABLET | Freq: Every day | ORAL | Status: DC

## 2020-07-02 MED ORDER — PIPERACILLIN-TAZOBACTAM 3.375 G IVPB
3.3750 g | Freq: Three times a day (TID) | INTRAVENOUS | Status: DC
  Administered 2020-07-02 – 2020-07-04 (×7): 3.375 g via INTRAVENOUS
  Filled 2020-07-02 (×8): qty 50

## 2020-07-02 MED ORDER — MAGNESIUM HYDROXIDE 400 MG/5ML PO SUSP: 30.0000 mL | Freq: Every day | ORAL | Status: DC | PRN

## 2020-07-02 MED ORDER — THIAMINE HCL 100 MG/ML IJ SOLN
250.0000 mg | Freq: Every day | INTRAVENOUS | Status: DC
  Administered 2020-07-06 – 2020-07-07 (×2): 250 mg via INTRAVENOUS
  Filled 2020-07-02 (×4): qty 2.5

## 2020-07-02 MED ORDER — THIAMINE HCL 100 MG/ML IJ SOLN
500.0000 mg | Freq: Three times a day (TID) | INTRAVENOUS | Status: AC
  Administered 2020-07-02 – 2020-07-05 (×9): 500 mg via INTRAVENOUS
  Filled 2020-07-02 (×9): qty 5

## 2020-07-02 MED ORDER — POTASSIUM CHLORIDE IN NACL 20-0.9 MEQ/L-% IV SOLN
INTRAVENOUS | Status: DC
  Filled 2020-07-02 (×17): qty 1000

## 2020-07-02 MED ORDER — PANTOPRAZOLE SODIUM 40 MG IV SOLR
40.0000 mg | Freq: Two times a day (BID) | INTRAVENOUS | Status: DC
  Administered 2020-07-02 – 2020-07-05 (×7): 40 mg via INTRAVENOUS
  Filled 2020-07-02 (×6): qty 40

## 2020-07-02 MED ORDER — ONDANSETRON HCL 4 MG/2ML IJ SOLN
4.0000 mg | Freq: Four times a day (QID) | INTRAMUSCULAR | Status: DC | PRN
  Administered 2020-07-03 – 2020-07-07 (×4): 4 mg via INTRAVENOUS
  Filled 2020-07-02 (×4): qty 2

## 2020-07-02 MED ORDER — THIAMINE HCL 100 MG/ML IJ SOLN: 100.0000 mg | Freq: Every day | INTRAMUSCULAR | Status: DC

## 2020-07-02 MED ORDER — MAGNESIUM SULFATE 2 GM/50ML IV SOLN
INTRAVENOUS | Status: AC
  Filled 2020-07-02: qty 50

## 2020-07-02 MED ORDER — ACETAMINOPHEN 650 MG RE SUPP: 650.0000 mg | Freq: Four times a day (QID) | RECTAL | Status: DC | PRN

## 2020-07-02 MED ORDER — ALBUTEROL SULFATE HFA 108 (90 BASE) MCG/ACT IN AERS
2.0000 | INHALATION_SPRAY | Freq: Four times a day (QID) | RESPIRATORY_TRACT | Status: DC | PRN
  Filled 2020-07-02: qty 6.7

## 2020-07-02 MED ORDER — FOLIC ACID 1 MG PO TABS
1.0000 mg | ORAL_TABLET | Freq: Every day | ORAL | Status: DC
  Administered 2020-07-02 – 2020-07-07 (×6): 1 mg via ORAL
  Filled 2020-07-02 (×6): qty 1

## 2020-07-02 MED ORDER — PIPERACILLIN-TAZOBACTAM 3.375 G IVPB 30 MIN
3.3750 g | Freq: Once | INTRAVENOUS | Status: AC
  Administered 2020-07-02: 3.375 g via INTRAVENOUS
  Filled 2020-07-02: qty 50

## 2020-07-02 MED ORDER — ADULT MULTIVITAMIN W/MINERALS CH
1.0000 | ORAL_TABLET | Freq: Every day | ORAL | Status: DC
  Administered 2020-07-02 – 2020-07-07 (×6): 1 via ORAL
  Filled 2020-07-02 (×6): qty 1

## 2020-07-02 MED ORDER — PANTOPRAZOLE SODIUM 40 MG IV SOLR
40.0000 mg | Freq: Once | INTRAVENOUS | Status: AC
  Administered 2020-07-02: 40 mg via INTRAVENOUS
  Filled 2020-07-02: qty 40

## 2020-07-02 NOTE — H&P (Signed)
[place]   PATIENT NAME: Kristine Macias    MR#:  [id number]  DATE OF BIRTH:  01/12/1986  DATE OF ADMISSION:  07/01/2020  PRIMARY CARE PHYSICIAN: Patient, No Pcp Per   REQUESTING/REFERRING PHYSICIAN: Gonzella Lex, MD  CHIEF COMPLAINT:   Chief Complaint  Patient presents with  . Emesis  . Abdominal Pain    HISTORY OF PRESENT ILLNESS:  Kristine Macias  is a 34 y.o. Caucasian female with a known history of multiple medical problems that are mentioned below including alcohol abuse, alcoholic liver cirrhosis, Crohn's disease, depression and anxiety alcohol withdrawal seizures in hypertension, who presented to the emergency room with acute onset of coffee-ground emesis with associated nausea as well as diarrhea with loose bowel movements.  She describes black stools without bright red bleeding per rectum.  She denies any fever or chills.  She has been having mild generalized abdominal pain mainly in the epigastric area and right upper quadrant area.  Last alcoholic drink was this morning.  She stated that she drinks 4-5 beer per day.  No chest pain or palpitations.  No dysuria, oliguria or hematuria or flank pain.  She was having resting tremors.  Upon presentation to the emergency room, vital signs showed elevated blood pressure 141/101 and later 150/118 with heart rate 119.  Labs revealed potassium of 3.3 with a calcium of 6.7 and magnesium 1.6.  CBC was unremarkable LFTs showed elevated AST of 211 and ALT of 86 with albumin 3.4 and total protein 6.5.  Urine pregnancy test was negative.  EKG showed sinus tachycardia with rate 113 CT angiography of the abdomen and pelvis showed wall thickening in the right colon and the transverse colon concerning for infectious or inflammatory colitis and severe diffuse fatty liver.  She was given IV Ativan and Zosyn as well as IV Protonix, p.o. potassium chloride 40 mEq and 10 mEq IV as well as Haldol 5 mg IV was placed on CIWA protocol.  She  will be admitted to a progressive unit bed for further evaluation and management. PAST MEDICAL HISTORY:   Past Medical History:  Diagnosis Date  . Alcohol abuse 11/2014   drinking since age 8. chronic, recurrent. at least 2 detox admits before 2015.   Marland Kitchen Alcoholic hepatitis 04/3266   hepatic steatosis on 11/2014 ultrasound.   . Breast cancer [facility])    age 76, lump removed from left breast  . Chlamydia 2007   bacterial vaginosis 09/2011  . Colitis 12/2014   colonoscopy for diarrhea and abnml CT 01/06/15: erythmatous TI (path: active ileitis, ? emerging IBD), rectal erythema (path: mucosal prolapse). Random bx of normal colon (path unremarkable)   . Congenital deafness    left ear only.  Also noted in mother and sibling (unilateral).   . Crohn's disease ([place])   . Depression with anxiety initally at age 77  . Failure to thrive in adult 12/2014.    Malnutrition: n/v, not eating, weight loss, BMI 14.  s/p 01/11/2015 PEG (Dr Dorna Leitz).   . Hypertensive urgency   . Irregular heart beat 2010   wt/diet related after evaluation  . Pancreas divisum 02/2015   Type 1 seen on CT.   Marland Kitchen Pneumothorax, spontaneous, tension   . Renal disorder   . Seizure ([place]) 06/2015   due to ETOH/benzo withdrawal.   . Spontaneous pneumothorax 08/2012   right.  chest tube placed.   . Thrombocytopenia ([place]) 04/2007    PAST SURGICAL HISTORY:   Past Surgical History:  Procedure Laterality Date  . CESAREAN SECTION  07/2009    x 1.  G3, Para 2012.   . CHEST TUBE INSERTION    . COLONOSCOPY WITH PROPOFOL N/A 01/06/2015   [facility], Dr Rayann Heman. colonoscopy for diarrhea and abnml CT 01/06/15: erythmatous TI (path: active ileitis, ? emerging IBD), rectal erythema (path: mucosal prolapse). Random bx of normal colon (path unremarkable)   . ERCP N/A 09/28/2019   Procedure: ENDOSCOPIC RETROGRADE CHOLANGIOPANCREATOGRAPHY (ERCP);  Surgeon: Lucilla Lame, MD;  Location: [facility] ENDOSCOPY;  Service: Endoscopy;  Laterality: N/A;  .  ESOPHAGOGASTRODUODENOSCOPY N/A 01/06/2015   ;[facility], Dr Rayann Heman. For wt loss, N/V: Normal study, duodenal biopsy/pathology: chronic active duodenitis.   Marland Kitchen ESOPHAGOGASTRODUODENOSCOPY N/A 01/11/2015   Rein-normal with PEG placement  . PEG PLACEMENT N/A 01/11/2015   [facility], Dr Rayann Heman.  for N/V/wt loss/severe malnutrition.     SOCIAL HISTORY:   Social History   Tobacco Use  . Smoking status: Current Every Day Smoker    Packs/day: 1.00    Years: 13.00    Pack years: 13.00    Types: E-cigarettes  . Smokeless tobacco: Never Used  Substance Use Topics  . Alcohol use: Yes    Alcohol/week: 36.0 standard drinks    Types: 36 Cans of beer per week    Comment: Last drink last night- 2 drinks    FAMILY HISTORY:   Family History  Problem Relation Age of Onset  . Thyroid disease Mother   . Cancer Mother        breast cancer  . Cancer Father        lung cancer  . Stroke Other   . Crohn's disease Brother   . Cancer Maternal Grandmother        breast cancer  . Anesthesia problems Neg Hx     DRUG ALLERGIES:   Allergies  Allergen Reactions  . Aspirin Shortness Of Breath  . Effexor [Venlafaxine] Anaphylaxis  . Gabapentin Anaphylaxis  . Libritabs [Chlordiazepoxide] Anaphylaxis    Tolerates lorazepam and clonazepam  . Quetiapine Hives, Itching and Rash  . Sulfa Antibiotics Anaphylaxis  . Tetracyclines & Related Anaphylaxis  . Trazodone And Nefazodone Anaphylaxis  . Latex Hives and Itching  . Paxil [Paroxetine] Hives and Itching  . Seroquel [Quetiapine Fumarate] Hives and Itching  . Zoloft [Sertraline Hcl] Hives and Itching    REVIEW OF SYSTEMS:   ROS As per history of present illness. All pertinent systems were reviewed above. Constitutional, HEENT, cardiovascular, respiratory, GI, GU, musculoskeletal, neuro, psychiatric, endocrine, integumentary and hematologic systems were reviewed and are otherwise negative/unremarkable except for positive findings mentioned above in the  HPI.   MEDICATIONS AT HOME:   Prior to Admission medications   Medication Sig Start Date End Date Taking? Authorizing Provider  acetaminophen (TYLENOL) 325 MG tablet Take by mouth every 6 (six) hours as needed for headache.    [provider]  albuterol (PROVENTIL HFA;VENTOLIN HFA) 108 (90 Base) MCG/ACT inhaler Inhale 2 puffs into the lungs every 6 (six) hours as needed for wheezing or shortness of breath. 02/13/18   Demetrios Loll, MD  LORazepam (ATIVAN) 1 MG tablet Day 1: 2 mg (2 tablets) every 8 hours. Day 2: 2 mg (2 tablets) every 8 hours. Day 3: 1 mg (1 tablet) every 8 hours. Day 4: 1 mg (1 tablet) every 12 hours) Day 5: 1 mg (1 tablet) at bedtime. Patient not taking: Reported on 01/07/2020 12/11/19   Lilia Pro., MD  Melatonin 5 MG TABS Take 5 mg by  mouth at bedtime as needed (sleep).    [provider]  Multiple Vitamin (MULTIVITAMIN WITH MINERALS) TABS tablet Take 1 tablet by mouth daily. 09/29/19   Fritzi Mandes, MD  omeprazole (PRILOSEC) 40 MG capsule Take 1 capsule (40 mg total) by mouth daily. 12/11/19 01/10/20  Lilia Pro., MD  traMADol (ULTRAM) 50 MG tablet Take 1 tablet (50 mg total) by mouth 3 (three) times daily as needed for moderate pain. 09/29/19   Fritzi Mandes, MD      VITAL SIGNS:  Blood pressure (!) 150/84, pulse (!) 117, temperature 98.6 F (37 C), temperature source Oral, resp. rate 17, height 5' 6"  (1.676 m), weight 68.9 kg, SpO2 95 %.  PHYSICAL EXAMINATION:  Physical Exam  GENERAL:  34 y.o.-year-old patient lying in the bed with no acute distress.  EYES: Pupils equal, round, reactive to light and accommodation. No scleral icterus. Extraocular muscles intact.  HEENT: Head atraumatic, normocephalic. Oropharynx and nasopharynx clear.  NECK:  Supple, no jugular venous distention. No thyroid enlargement, no tenderness.  LUNGS: Normal breath sounds bilaterally, no wheezing, rales,rhonchi or crepitation. No use of accessory muscles of respiration.   CARDIOVASCULAR: Regular rate and rhythm, S1, S2 normal. No murmurs, rubs, or gallops.  ABDOMEN: Soft, nondistended, with epigastric and right upper quadrant tenderness without rebound tenderness guarding or rigidity.  Bowel sounds present. No organomegaly or mass.  EXTREMITIES: No pedal edema, cyanosis, or clubbing.  NEUROLOGIC: Cranial nerves II through XII are intact. Muscle strength 5/5 in all extremities. Sensation intact. Gait not checked.  She had resting tremors. PSYCHIATRIC: The patient is alert and oriented x 3.  Normal affect and good eye contact. SKIN: No obvious rash, lesion, or ulcer.   LABORATORY PANEL:   CBC Recent Labs  Lab 07/02/20 0316  WBC 8.0  HGB 14.3  HCT 40.2  PLT 168   ------------------------------------------------------------------------------------------------------------------  Chemistries  Recent Labs  Lab 07/02/20 0316  NA 140  141  K 3.4*  3.3*  CL 106  106  CO2 21*  21*  GLUCOSE 113*  113*  BUN <5*  6  CREATININE 0.45  0.49  CALCIUM 6.7*  6.7*  MG 1.6*  AST 211*  ALT 86*  ALKPHOS 104  BILITOT 1.0   ------------------------------------------------------------------------------------------------------------------  Cardiac Enzymes No results for input(s): TROPONINI in the last 168 hours. ------------------------------------------------------------------------------------------------------------------  RADIOLOGY:  CT Angio Abd/Pel W and/or Wo Contrast  Result Date: 07/02/2020 CLINICAL DATA:  Left upper quadrant pain EXAM: CTA ABDOMEN AND PELVIS WITHOUT AND WITH CONTRAST TECHNIQUE: Multidetector CT imaging of the abdomen and pelvis was performed using the standard protocol during bolus administration of intravenous contrast. Multiplanar reconstructed images and MIPs were obtained and reviewed to evaluate the vascular anatomy. CONTRAST:  185m OMNIPAQUE IOHEXOL 350 MG/ML SOLN COMPARISON:  09/26/2019 FINDINGS: VASCULAR Aorta: Normal  caliber aorta without aneurysm, dissection, vasculitis or significant stenosis. Celiac: Patent without evidence of aneurysm, dissection, vasculitis or significant stenosis. SMA: Patent without evidence of aneurysm, dissection, vasculitis or significant stenosis. Renals: Both renal arteries are patent without evidence of aneurysm, dissection, vasculitis, fibromuscular dysplasia or significant stenosis. IMA: Patent without evidence of aneurysm, dissection, vasculitis or significant stenosis. Inflow: Patent without evidence of aneurysm, dissection, vasculitis or significant stenosis. Proximal Outflow: Bilateral common femoral and visualized portions of the superficial and profunda femoral arteries are patent without evidence of aneurysm, dissection, vasculitis or significant stenosis. Veins: No obvious venous abnormality within the limitations of this arterial phase study. Review of the MIP images confirms the above findings.  NON-VASCULAR Lower chest: Negative. Hepatobiliary: Severe diffuse fatty infiltration of the liver. No focal hepatic abnormality. Gallbladder unremarkable. Pancreas: No focal abnormality or ductal dilatation. Spleen: No focal abnormality.  Normal size. Adrenals/Urinary Tract: No adrenal abnormality. No focal renal abnormality. No stones or hydronephrosis. Urinary bladder is unremarkable. Stomach/Bowel: There is colonic wall thickening noted involving the cecum, ascending colon and transverse colon concerning for colitis. Stomach and small bowel decompressed. No bowel obstruction. Lymphatic: No adenopathy Reproductive: Uterus and adnexa unremarkable.  No mass. Other: No free fluid or free air. Musculoskeletal: No acute bony abnormality. IMPRESSION: VASCULAR No acute or significant vascular abnormality. NON-VASCULAR Severe diffuse fatty infiltration of the liver. Wall thickening in the right colon and transverse colon concerning for infectious or inflammatory colitis. Electronically Signed   By:  Rolm Baptise M.D.   On: 07/02/2020 01:11      IMPRESSION AND PLAN:  1.  Acute colitis. -The patient will be admitted to a progressive unit bed. -We will continue antibiotic therapy with IV Zosyn. -Pain management will be provided.  2.  GI bleeding. -The patient's hemoglobin hematocrit were 14.3 and 40.2. -Serial hemoglobins and hematocrits will be followed. -No recurrent GI bleeding here. -We will place on IV Protonix. -GI consultation will be obtained. -I notified Dr. Bonna Gains about the patient.  3.  Hypokalemia. -Potassium replaced and given hypomagnesemia, magnesium will be replaced as well.  4.  Alcohol withdrawal. -She will be placed on as needed Ativan with CIWA protocol.  5.  DVT prophylaxis. -SCDs. -Medical prophylaxis currently contraindicated due to GI bleeding.   All the records are reviewed and case discussed with ED provider. The plan of care was discussed in details with the patient (and family). I answered all questions. The patient agreed to proceed with the above mentioned plan. Further management will depend upon hospital course.   CODE STATUS: Full code  Status is: Inpatient  Remains inpatient appropriate because:Ongoing active pain requiring inpatient pain management, Ongoing diagnostic testing needed not appropriate for outpatient work up, Unsafe d/c plan, IV treatments appropriate due to intensity of illness or inability to take PO and Inpatient level of care appropriate due to severity of illness   Dispo: The patient is from: Home              Anticipated d/c is to: Home              Anticipated d/c date is: 2 days              Patient currently is not medically stable to d/c.   TOTAL TIME TAKING CARE OF THIS PATIENT: 55 minutes.    Christel Mormon M.D on 07/02/2020 at 5:42 AM  [facility]   From 7 PM-7 AM, contact night-coverage [email or web address]  CC: Primary care physician; Patient, No Pcp Per

## 2020-07-02 NOTE — Progress Notes (Addendum)
PROGRESS NOTE    [name]  HFW:[id number] DOB: 1985/12/02 DOA: 07/01/2020 PCP: Patient, No Pcp Per   Chief Complaint  Patient presents with  . Emesis  . Abdominal Pain    Brief Narrative:  Kristine Macias  is Kristine Macias 34 y.o. Caucasian female with Shirly Bartosiewicz known history of multiple medical problems that are mentioned below including alcohol abuse, alcoholic liver cirrhosis, Crohn's disease, depression and anxiety alcohol withdrawal seizures in hypertension, who presented to the emergency room with acute onset of coffee-ground emesis with associated nausea as well as diarrhea with loose bowel movements.  She describes black stools without bright red bleeding per rectum.  She denies any fever or chills.  She has been having mild generalized abdominal pain mainly in the epigastric area and right upper quadrant area.  Last alcoholic drink was this morning.  She stated that she drinks 4-5 beer per day.  No chest pain or palpitations.  No dysuria, oliguria or hematuria or flank pain.  She was having resting tremors.  Upon presentation to the emergency room, vital signs showed elevated blood pressure 141/101 and later 150/118 with heart rate 119.  Labs revealed potassium of 3.3 with Charlton Boule calcium of 6.7 and magnesium 1.6.  CBC was unremarkable LFTs showed elevated AST of 211 and ALT of 86 with albumin 3.4 and total protein 6.5.  Urine pregnancy test was negative.  EKG showed sinus tachycardia with rate 113 CT angiography of the abdomen and pelvis showed wall thickening in the right colon and the transverse colon concerning for infectious or inflammatory colitis and severe diffuse fatty liver.  She was given IV Ativan and Zosyn as well as IV Protonix, p.o. potassium chloride 40 mEq and 10 mEq IV as well as Haldol 5 mg IV was placed on CIWA protocol.  She will be admitted to [facility] progressive [facility] bed for further evaluation and management.   Assessment & Plan:   Active Problems:   Alcohol withdrawal (HCC)    GI bleeding  1.  Acute colitis  hx Crohn's disease - CT with wall thickening in R and transverse colon concerning for infectious/inflammatory colitis  - negative GI path panel and c diff - ddx includes infectious vs inflammatory (she has hx crohn's) -We will continue antibiotic therapy with IV Zosyn. -GI c/s, appreciate recs  2.  GI bleeding. -The patient's hemoglobin hematocrit at presentation 14.3 and 40.2. -Serial hemoglobins and hematocrits will be followed. -No recurrent GI bleeding here. -We will place on IV Protonix. -GI consultation will be obtained, appreciate assistance  # Acute Metabolic Encephalopathy: 2/2 etoh withdrawal.  Follow ammonia, b12, folate. Delirium precautions High risk for complicated withdrawal, follow  # Elevated LFT's: likely 2/2 etoh use, follow.  Acute hepatitis panel.  3.  Hypokalemia. -replace and follow, follow mag  4.  Alcohol withdrawal. -She will be placed on as needed Ativan with CIWA protocol - high ris for withdrawal  5.  DVT prophylaxis. -SCDs. -Medical prophylaxis currently contraindicated due to GI bleeding.  DVT prophylaxis: SCD Code Status: full  Family Communication: none at bedside Disposition:   Status is: Inpatient  Remains inpatient appropriate because:Inpatient level of care appropriate due to severity of illness   Dispo: The patient is from: Home              Anticipated d/c is to: Home              Anticipated d/c date is: > 3 days  Patient currently is not medically stable to d/c.       Consultants:   GI  Procedures:  none  Antimicrobials:  Anti-infectives (From admission, onward)   Start     Dose/Rate Route Frequency Ordered Stop   07/02/20 0800  piperacillin-tazobactam (ZOSYN) IVPB 3.375 g        3.375 g 12.5 mL/hr over 240 Minutes Intravenous Every 8 hours 07/02/20 0754     07/02/20 0200  piperacillin-tazobactam (ZOSYN) IVPB 3.375 g  Status:  Discontinued        3.375 g 100  mL/hr over 30 Minutes Intravenous Every 6 hours 07/02/20 0157 07/02/20 0754   07/02/20 0115  piperacillin-tazobactam (ZOSYN) IVPB 3.375 g        3.375 g 100 mL/hr over 30 Minutes Intravenous  Once 07/02/20 0114 07/02/20 0213         Subjective: Confused Says she's at dr. Gabriel Carina and thinks it's December  Objective: Vitals:   07/02/20 1230 07/02/20 1300 07/02/20 1330 07/02/20 1400  BP: 125/87 128/88 127/82 (!) 126/93  Pulse: 100 (!) 101 97 91  Resp: 17 19 17  (!) 24  Temp:      TempSrc:      SpO2: 95% 96% 96% 95%  Weight:      Height:        Intake/Output Summary (Last 24 hours) at 07/02/2020 1502 Last data filed at 07/02/2020 1420 Gross per 24 hour  Intake 900 ml  Output --  Net 900 ml   Filed Weights   07/01/20 1827  Weight: 68.9 kg    Examination:  General exam: Appears calm and comfortable  Respiratory system: Clear to auscultation. Respiratory effort normal. Cardiovascular system: S1 & S2 heard, RRR Gastrointestinal system: Abdomen is nondistended, soft and mildly tender. No organomegaly or masses felt. Normal bowel sounds heard. Central nervous system: disoriented, but calm. No focal neurological deficits. Extremities: no LEE Skin: No rashes, lesions or ulcers Psychiatry: Judgement and insight appear normal. Mood & affect appropriate.     Data Reviewed: I have personally reviewed following labs and imaging studies  CBC: Recent Labs  Lab 07/01/20 1831 07/02/20 0316  WBC 7.4 8.0  HGB 16.6* 14.3  HCT 46.3* 40.2  MCV 98.9 98.5  PLT 234 341    Basic Metabolic Panel: Recent Labs  Lab 07/01/20 1831 07/02/20 0316  NA 139 140  141  K 2.3* 3.4*  3.3*  CL 97* 106  106  CO2 23 21*  21*  GLUCOSE 202* 113*  113*  BUN 5* <5*  6  CREATININE 0.55 0.45  0.49  CALCIUM 8.0* 6.7*  6.7*  MG 2.0 1.6*  PHOS  --  2.8    GFR: Estimated Creatinine Clearance: 92.8 mL/min (by C-G formula based on SCr of 0.49 mg/dL).  Liver Function Tests: Recent Labs   Lab 07/01/20 1831 07/02/20 0316  AST 245* 211*  ALT 104* 86*  ALKPHOS 132* 104  BILITOT 0.9 1.0  PROT 8.3* 6.5  ALBUMIN 4.2 3.4*    CBG: No results for input(s): GLUCAP in the last 168 hours.   Recent Results (from the past 240 hour(s))  Respiratory Panel by RT PCR (Flu Fairley Copher&B, Covid) - Nasopharyngeal Swab     Status: None   Collection Time: 07/01/20  6:16 PM   Specimen: Nasopharyngeal Swab  Result Value Ref Range Status   SARS Coronavirus 2 by RT PCR NEGATIVE NEGATIVE Final    Comment: (NOTE) SARS-CoV-2 target nucleic acids are NOT DETECTED.  The SARS-CoV-2  RNA is generally detectable in upper respiratoy specimens during the acute phase of infection. The lowest concentration of SARS-CoV-2 viral copies this assay can detect is 131 copies/mL. Kinslee Dalpe negative result does not preclude SARS-Cov-2 infection and should not be used as the sole basis for treatment or other patient management decisions. Shanna Strength negative result may occur with  improper specimen collection/handling, submission of specimen other than nasopharyngeal swab, presence of viral mutation(s) within the areas targeted by this assay, and inadequate number of viral copies (<131 copies/mL). Madlyn Crosby negative result must be combined with clinical observations, patient history, and epidemiological information. The expected result is Negative.  Fact Sheet for Patients:  PinkCheek.be  Fact Sheet for Healthcare Providers:  GravelBags.it  This test is no t yet approved or cleared by the [place] FDA and  has been authorized for detection and/or diagnosis of SARS-CoV-2 by FDA under an Emergency Use Authorization (EUA). This EUA will remain  in effect (meaning this test can be used) for the duration of the COVID-19 declaration under Section 564(b)(1) of the Act, 21 U.S.C. section 360bbb-3(b)(1), unless the authorization is terminated or revoked sooner.     Influenza Mattix Imhof by  PCR NEGATIVE NEGATIVE Final   Influenza B by PCR NEGATIVE NEGATIVE Final    Comment: (NOTE) The Xpert Xpress SARS-CoV-2/FLU/RSV assay is intended as an aid in  the diagnosis of influenza from Nasopharyngeal swab specimens and  should not be used as Demitrius Crass sole basis for treatment. Nasal washings and  aspirates are unacceptable for Xpert Xpress SARS-CoV-2/FLU/RSV  testing.  Fact Sheet for Patients: PinkCheek.be  Fact Sheet for Healthcare Providers: GravelBags.it  This test is not yet approved or cleared by the [place] FDA and  has been authorized for detection and/or diagnosis of SARS-CoV-2 by  FDA under an Emergency Use Authorization (EUA). This EUA will remain  in effect (meaning this test can be used) for the duration of the  Covid-19 declaration under Section 564(b)(1) of the Act, 21  U.S.C. section 360bbb-3(b)(1), unless the authorization is  terminated or revoked. Performed at [facility], [place], [place] [address]   C Difficile Quick Screen w PCR reflex     Status: None   Collection Time: 07/02/20 10:25 AM   Specimen: STOOL  Result Value Ref Range Status   C Diff antigen NEGATIVE NEGATIVE Final   C Diff toxin NEGATIVE NEGATIVE Final   C Diff interpretation No C. difficile detected.  Final    Comment: Performed at [facility], [address] [place], [place] [address]  Gastrointestinal Panel by PCR , Stool     Status: None   Collection Time: 07/02/20 10:25 AM   Specimen: STOOL  Result Value Ref Range Status   Campylobacter species NOT DETECTED NOT DETECTED Final   Plesimonas shigelloides NOT DETECTED NOT DETECTED Final   Salmonella species NOT DETECTED NOT DETECTED Final   Yersinia enterocolitica NOT DETECTED NOT DETECTED Final   Vibrio species NOT DETECTED NOT DETECTED Final   Vibrio cholerae NOT DETECTED NOT DETECTED Final   Enteroaggregative E coli (EAEC) NOT DETECTED  NOT DETECTED Final   Enteropathogenic E coli (EPEC) NOT DETECTED NOT DETECTED Final   Enterotoxigenic E coli (ETEC) NOT DETECTED NOT DETECTED Final   Shiga like toxin producing E coli (STEC) NOT DETECTED NOT DETECTED Final   Shigella/Enteroinvasive E coli (EIEC) NOT DETECTED NOT DETECTED Final   Cryptosporidium NOT DETECTED NOT DETECTED Final   Cyclospora cayetanensis NOT DETECTED NOT DETECTED Final  Entamoeba histolytica NOT DETECTED NOT DETECTED Final   Giardia lamblia NOT DETECTED NOT DETECTED Final   Adenovirus F40/41 NOT DETECTED NOT DETECTED Final   Astrovirus NOT DETECTED NOT DETECTED Final   Norovirus GI/GII NOT DETECTED NOT DETECTED Final   Rotavirus Tatsuya Okray NOT DETECTED NOT DETECTED Final   Sapovirus (I, II, IV, and V) NOT DETECTED NOT DETECTED Final    Comment: Performed at [facility], [address] [place], [place] [address]         Radiology Studies: DG Chest Port 1 View  Result Date: 07/02/2020 CLINICAL DATA:  Vomiting, abdominal pain EXAM: PORTABLE CHEST 1 VIEW COMPARISON:  02/10/2018 chest radiograph. FINDINGS: Stable cardiomediastinal silhouette with normal heart size. No pneumothorax. No pleural effusion. Lungs appear clear, with no acute consolidative airspace disease and no pulmonary edema. IMPRESSION: No active disease. Electronically Signed   By: Ilona Sorrel M.D.   On: 07/02/2020 09:14   CT Angio Abd/Pel W and/or Wo Contrast  Result Date: 07/02/2020 CLINICAL DATA:  Left upper quadrant pain EXAM: CTA ABDOMEN AND PELVIS WITHOUT AND WITH CONTRAST TECHNIQUE: Multidetector CT imaging of the abdomen and pelvis was performed using the standard protocol during bolus administration of intravenous contrast. Multiplanar reconstructed images and MIPs were obtained and reviewed to evaluate the vascular anatomy. CONTRAST:  149m OMNIPAQUE IOHEXOL 350 MG/ML SOLN COMPARISON:  09/26/2019 FINDINGS: VASCULAR Aorta: Normal caliber aorta without aneurysm, dissection, vasculitis  or significant stenosis. Celiac: Patent without evidence of aneurysm, dissection, vasculitis or significant stenosis. SMA: Patent without evidence of aneurysm, dissection, vasculitis or significant stenosis. Renals: Both renal arteries are patent without evidence of aneurysm, dissection, vasculitis, fibromuscular dysplasia or significant stenosis. IMA: Patent without evidence of aneurysm, dissection, vasculitis or significant stenosis. Inflow: Patent without evidence of aneurysm, dissection, vasculitis or significant stenosis. Proximal Outflow: Bilateral common femoral and visualized portions of the superficial and profunda femoral arteries are patent without evidence of aneurysm, dissection, vasculitis or significant stenosis. Veins: No obvious venous abnormality within the limitations of this arterial phase study. Review of the MIP images confirms the above findings. NON-VASCULAR Lower chest: Negative. Hepatobiliary: Severe diffuse fatty infiltration of the liver. No focal hepatic abnormality. Gallbladder unremarkable. Pancreas: No focal abnormality or ductal dilatation. Spleen: No focal abnormality.  Normal size. Adrenals/Urinary Tract: No adrenal abnormality. No focal renal abnormality. No stones or hydronephrosis. Urinary bladder is unremarkable. Stomach/Bowel: There is colonic wall thickening noted involving the cecum, ascending colon and transverse colon concerning for colitis. Stomach and small bowel decompressed. No bowel obstruction. Lymphatic: No adenopathy Reproductive: Uterus and adnexa unremarkable.  No mass. Other: No free fluid or free air. Musculoskeletal: No acute bony abnormality. IMPRESSION: VASCULAR No acute or significant vascular abnormality. NON-VASCULAR Severe diffuse fatty infiltration of the liver. Wall thickening in the right colon and transverse colon concerning for infectious or inflammatory colitis. Electronically Signed   By: KRolm BaptiseM.D.   On: 07/02/2020 01:11         Scheduled Meds: . folic acid  1 mg Oral Daily  . multivitamin with minerals  1 tablet Oral Daily  . pantoprazole (PROTONIX) IV  40 mg Intravenous Q12H  . [START ON 07/10/2020] thiamine  100 mg Oral Daily   Continuous Infusions: . 0.9 % NaCl with KCl 20 mEq / L 100 mL/hr at 07/02/20 1450  . magnesium sulfate    . piperacillin-tazobactam (ZOSYN)  IV Stopped (07/02/20 1144)  . thiamine injection Stopped (07/02/20 1253)   Followed by  . [START ON 07/05/2020] thiamine  injection       LOS: 0 days    Time spent: over 30 min    Fayrene Helper, MD [facility]   To contact the attending provider between 7A-7P or the covering provider during after hours 7P-7A, please log into the web site [email or web address] and access using universal [place] password for that web site. If you do not have the password, please call the hospital operator.  07/02/2020, 3:02 PM

## 2020-07-02 NOTE — Consult Note (Signed)
Kristine Antigua, MD [address] [place], [place], [place], [address] [address], [place], [place], [place], [address] Phone: [phone]  Fax: [phone]  Consultation  Referring Provider:     Dr. Sidney Ace Primary Care Physician:  Patient, No Pcp Per Reason for Consultation:       Date of Admission:  07/01/2020 Date of Consultation:  07/02/2020         HPI:   Kristine Macias is a 34 y.o. female with history of alcohol abuse, ongoing alcohol use, presents with diarrhea, blood in stool for 1 day.  Patient denies any hematemesis.  Reports 1 episode of emesis that did not consist of blood.  Hemoglobin 14 on presentation.  Patient has chronic elevation in transaminases baseline and these are elevated again today.  Normal bilirubin and alk phos at this time.  Past Medical History:  Diagnosis Date  . Alcohol abuse 11/2014   drinking since age 77. chronic, recurrent. at least 2 detox admits before 2015.   Marland Kitchen Alcoholic hepatitis 0/9628   hepatic steatosis on 11/2014 ultrasound.   . Breast cancer [facility])    age 20, lump removed from left breast  . Chlamydia 2007   bacterial vaginosis 09/2011  . Colitis 12/2014   colonoscopy for diarrhea and abnml CT 01/06/15: erythmatous TI (path: active ileitis, ? emerging IBD), rectal erythema (path: mucosal prolapse). Random bx of normal colon (path unremarkable)   . Congenital deafness    left ear only.  Also noted in mother and sibling (unilateral).   . Crohn's disease ([place])   . Depression with anxiety initally at age 77  . Failure to thrive in adult 12/2014.    Malnutrition: n/v, not eating, weight loss, BMI 14.  s/p 01/11/2015 PEG (Dr Dorna Leitz).   . Hypertensive urgency   . Irregular heart beat 2010   wt/diet related after evaluation  . Pancreas divisum 02/2015   Type 1 seen on CT.   Marland Kitchen Pneumothorax, spontaneous, tension   . Renal disorder   . Seizure ([place]) 06/2015   due to ETOH/benzo withdrawal.   . Spontaneous pneumothorax 08/2012   right.   chest tube placed.   . Thrombocytopenia ([place]) 04/2007    Past Surgical History:  Procedure Laterality Date  . CESAREAN SECTION  07/2009    x 1.  G3, Para 2012.   . CHEST TUBE INSERTION    . COLONOSCOPY WITH PROPOFOL N/A 01/06/2015   [facility], Dr Rayann Heman. colonoscopy for diarrhea and abnml CT 01/06/15: erythmatous TI (path: active ileitis, ? emerging IBD), rectal erythema (path: mucosal prolapse). Random bx of normal colon (path unremarkable)   . ERCP N/A 09/28/2019   Procedure: ENDOSCOPIC RETROGRADE CHOLANGIOPANCREATOGRAPHY (ERCP);  Surgeon: Lucilla Lame, MD;  Location: [facility] ENDOSCOPY;  Service: Endoscopy;  Laterality: N/A;  . ESOPHAGOGASTRODUODENOSCOPY N/A 01/06/2015   ;[facility], Dr Rayann Heman. For wt loss, N/V: Normal study, duodenal biopsy/pathology: chronic active duodenitis.   Marland Kitchen ESOPHAGOGASTRODUODENOSCOPY N/A 01/11/2015   Rein-normal with PEG placement  . PEG PLACEMENT N/A 01/11/2015   [facility], Dr Rayann Heman.  for N/V/wt loss/severe malnutrition.     Prior to Admission medications   Medication Sig Start Date End Date Taking? Authorizing Provider  acetaminophen (TYLENOL) 325 MG tablet Take by mouth every 6 (six) hours as needed for headache.    [provider]  albuterol (PROVENTIL HFA;VENTOLIN HFA) 108 (90 Base) MCG/ACT inhaler Inhale 2 puffs into the lungs every 6 (six) hours as needed for wheezing or shortness of breath. 02/13/18   Bridgett Larsson,  Sheppard Evens, MD  LORazepam (ATIVAN) 1 MG tablet Day 1: 2 mg (2 tablets) every 8 hours. Day 2: 2 mg (2 tablets) every 8 hours. Day 3: 1 mg (1 tablet) every 8 hours. Day 4: 1 mg (1 tablet) every 12 hours) Day 5: 1 mg (1 tablet) at bedtime. Patient not taking: Reported on 01/07/2020 12/11/19   Lilia Pro., MD  Melatonin 5 MG TABS Take 5 mg by mouth at bedtime as needed (sleep).    [provider]  Multiple Vitamin (MULTIVITAMIN WITH MINERALS) TABS tablet Take 1 tablet by mouth daily. 09/29/19   Fritzi Mandes, MD  omeprazole (PRILOSEC) 40 MG capsule Take 1 capsule (40 mg  total) by mouth daily. 12/11/19 01/10/20  Lilia Pro., MD  traMADol (ULTRAM) 50 MG tablet Take 1 tablet (50 mg total) by mouth 3 (three) times daily as needed for moderate pain. 09/29/19   Fritzi Mandes, MD    Family History  Problem Relation Age of Onset  . Thyroid disease Mother   . Cancer Mother        breast cancer  . Cancer Father        lung cancer  . Stroke Other   . Crohn's disease Brother   . Cancer Maternal Grandmother        breast cancer  . Anesthesia problems Neg Hx      Social History   Tobacco Use  . Smoking status: Current Every Day Smoker    Packs/day: 1.00    Years: 13.00    Pack years: 13.00    Types: E-cigarettes  . Smokeless tobacco: Never Used  Vaping Use  . Vaping Use: Every day  . Substances: Nicotine  Substance Use Topics  . Alcohol use: Yes    Alcohol/week: 36.0 standard drinks    Types: 36 Cans of beer per week    Comment: Last drink last night- 2 drinks  . Drug use: No    Allergies as of 07/01/2020 - Review Complete 07/01/2020  Allergen Reaction Noted  . Aspirin Shortness Of Breath 05/23/2015  . Effexor [venlafaxine] Anaphylaxis 01/13/2015  . Gabapentin Anaphylaxis 05/30/2016  . Libritabs [chlordiazepoxide] Anaphylaxis 02/16/2017  . Quetiapine Hives, Itching, and Rash 01/26/2015  . Sulfa antibiotics Anaphylaxis 08/31/2011  . Tetracyclines & related Anaphylaxis 08/31/2011  . Trazodone and nefazodone Anaphylaxis 01/13/2015  . Latex Hives and Itching 08/31/2011  . Paxil [paroxetine] Hives and Itching 01/13/2015  . Seroquel [quetiapine fumarate] Hives and Itching 01/13/2015  . Zoloft [sertraline hcl] Hives and Itching 01/13/2015    Review of Systems:    All systems reviewed and negative except where noted in HPI.   Physical Exam:  Vital signs in last 24 hours: Vitals:   07/02/20 0930 07/02/20 1031 07/02/20 1045 07/02/20 1050  BP: (!) 174/97 (!) 144/96    Pulse:  (!) 123 (!) 108 (!) 121  Resp: (!) _0 Temp:      TempSrc:       SpO2:   95% 94%  Weight:      Height:         General:   Pleasant, cooperative in NAD Head:  Normocephalic and atraumatic. Eyes:   No icterus.   Conjunctiva pink. PERRLA. Ears:  Normal auditory acuity. Neck:  Supple; no masses or thyroidomegaly Lungs: Respirations even and unlabored. Lungs clear to auscultation bilaterally.   No wheezes, crackles, or rhonchi.  Abdomen:  Soft, nondistended, nontender. Normal bowel sounds. No appreciable masses or hepatomegaly.  No rebound  or guarding.  Neurologic:  Alert and oriented x3;  grossly normal neurologically. Skin:  Intact without significant lesions or rashes. Cervical Nodes:  No significant cervical adenopathy. Psych:  Alert and cooperative. Normal affect.  LAB RESULTS: Recent Labs    07/01/20 1831 07/02/20 0316  WBC 7.4 8.0  HGB 16.6* 14.3  HCT 46.3* 40.2  PLT 234 168   BMET Recent Labs    07/01/20 1831 07/02/20 0316  NA 139 140  141  K 2.3* 3.4*  3.3*  CL 97* 106  106  CO2 23 21*  21*  GLUCOSE 202* 113*  113*  BUN 5* <5*  6  CREATININE 0.55 0.45  0.49  CALCIUM 8.0* 6.7*  6.7*   LFT Recent Labs    07/02/20 0316  PROT 6.5  ALBUMIN 3.4*  AST 211*  ALT 86*  ALKPHOS 104  BILITOT 1.0   PT/INR No results for input(s): LABPROT, INR in the last 72 hours.  STUDIES: DG Chest Port 1 View  Result Date: 07/02/2020 CLINICAL DATA:  Vomiting, abdominal pain EXAM: PORTABLE CHEST 1 VIEW COMPARISON:  02/10/2018 chest radiograph. FINDINGS: Stable cardiomediastinal silhouette with normal heart size. No pneumothorax. No pleural effusion. Lungs appear clear, with no acute consolidative airspace disease and no pulmonary edema. IMPRESSION: No active disease. Electronically Signed   By: Ilona Sorrel M.D.   On: 07/02/2020 09:14   CT Angio Abd/Pel W and/or Wo Contrast  Result Date: 07/02/2020 CLINICAL DATA:  Left upper quadrant pain EXAM: CTA ABDOMEN AND PELVIS WITHOUT AND WITH CONTRAST TECHNIQUE: Multidetector CT imaging of  the abdomen and pelvis was performed using the standard protocol during bolus administration of intravenous contrast. Multiplanar reconstructed images and MIPs were obtained and reviewed to evaluate the vascular anatomy. CONTRAST:  133m OMNIPAQUE IOHEXOL 350 MG/ML SOLN COMPARISON:  09/26/2019 FINDINGS: VASCULAR Aorta: Normal caliber aorta without aneurysm, dissection, vasculitis or significant stenosis. Celiac: Patent without evidence of aneurysm, dissection, vasculitis or significant stenosis. SMA: Patent without evidence of aneurysm, dissection, vasculitis or significant stenosis. Renals: Both renal arteries are patent without evidence of aneurysm, dissection, vasculitis, fibromuscular dysplasia or significant stenosis. IMA: Patent without evidence of aneurysm, dissection, vasculitis or significant stenosis. Inflow: Patent without evidence of aneurysm, dissection, vasculitis or significant stenosis. Proximal Outflow: Bilateral common femoral and visualized portions of the superficial and profunda femoral arteries are patent without evidence of aneurysm, dissection, vasculitis or significant stenosis. Veins: No obvious venous abnormality within the limitations of this arterial phase study. Review of the MIP images confirms the above findings. NON-VASCULAR Lower chest: Negative. Hepatobiliary: Severe diffuse fatty infiltration of the liver. No focal hepatic abnormality. Gallbladder unremarkable. Pancreas: No focal abnormality or ductal dilatation. Spleen: No focal abnormality.  Normal size. Adrenals/Urinary Tract: No adrenal abnormality. No focal renal abnormality. No stones or hydronephrosis. Urinary bladder is unremarkable. Stomach/Bowel: There is colonic wall thickening noted involving the cecum, ascending colon and transverse colon concerning for colitis. Stomach and small bowel decompressed. No bowel obstruction. Lymphatic: No adenopathy Reproductive: Uterus and adnexa unremarkable.  No mass. Other: No free  fluid or free air. Musculoskeletal: No acute bony abnormality. IMPRESSION: VASCULAR No acute or significant vascular abnormality. NON-VASCULAR Severe diffuse fatty infiltration of the liver. Wall thickening in the right colon and transverse colon concerning for infectious or inflammatory colitis. Electronically Signed   By: Kristine BaptiseM.D.   On: 07/02/2020 01:11      Impression / Plan:   SElorah Dewingis a 34y.o. y/o female with daily alcohol  use, years history of alcohol abuse, with bloody diarrhea for 1 day  Signs and symptoms most suspicious with possible infectious colitis  Would recommend ruling out infectious causes with C. difficile and GI panel  Initial hemoglobin on presentation is normal.  Would recommend repeat and transfusion as needed  Depending on findings of infectious panel and clinical status, we may consider colonoscopy if symptoms do not resolve   Okay to start clear liquid diet today  No signs of upper GI bleed (BUN at baseline consistent with the same)  Continue serial CBCs and transfuse PRN Avoid NSAIDs Maintain 2 large-bore IV lines Please page GI with any acute hemodynamic changes, or signs of active GI bleeding  Patient is hemodynamically stable at this time  CIWA protocol Folate, thiamine Encourage abstinence  AST ALT elevated in greater than 2:1 ratio consistent with elevation from alcohol use  Avoid hepatotoxic drugs  Repeat CMP daily  Replace electrolytes, low potassium, as necessary  No indication for urgent endoscopy at this time  Thank you for involving me in the care of this patient.      LOS: 0 days   Virgel Manifold, MD  07/02/2020, 11:10 AM

## 2020-07-02 NOTE — ED Notes (Signed)
Patient transported to CT 

## 2020-07-02 NOTE — Social Work (Signed)
TOC CM/SW consult received for SA counseling.  Patient was busy.  SW will continue to follow.

## 2020-07-02 NOTE — ED Notes (Signed)
Message sent to pharmacy for missing IV meds

## 2020-07-02 NOTE — ED Notes (Signed)
Pt helped to bathroom with one assist, very shaky. Pt awake, CIWA completed, see charting

## 2020-07-02 NOTE — ED Notes (Signed)
Pt helped to toilet, pt shaky and unsteady on feet, one assist needed.

## 2020-07-02 NOTE — ED Notes (Signed)
Pt OOB to toilet with one assist. Gait unsteady. Pt back to sleep immediately after using bathroom.

## 2020-07-02 NOTE — ED Notes (Signed)
Pt with O2 sat of 88-92 % on room air after ativan. Pt placed on O2 2 L [place]. O2 sat 93-94% on 2 L [place]

## 2020-07-02 NOTE — ED Provider Notes (Signed)
Accepted care of this patient from Dr. Archie Balboa at 11:30 PM.  This is a patient with a history of alcoholic cirrhosis who presented for evaluation of upper abdominal pain and several episodes of coffee-ground emesis and diarrhea.  Patient reports diarrhea initially was black however that has clear over the last 24 hours.  Review of epic shows a last EGD was in 2016 showing no signs of varices.  Rectal exam shows light yellow stool guaiac negative.  Patient with signs of alcohol withdrawal with tremors, tachycardia, and hypertension.  CIWA 16. She was given 2 mg of IV Ativan since patient does have a history of alcohol withdrawal seizures.  Her labs show hypokalemia which was initially replenished p.o. however patient did vomit in the emergency room after taking that.  Therefore I switched to IV potassium.  A CT of her abdomen was done showing no signs of active bleeding but did show signs of colitis.  Patient was given Zosyn.  Due to complicated withdrawals, coffee-ground emesis on a cirrhotic I do believe patient warrants admission for further evaluation and possible endoscopy.  She is hemodynamically stable with stable hemoglobin at this time with no need for transfusion.  Patient did receive a dose of IV Protonix. Will consult the hospitalist for admission.   I have personally reviewed the images performed during this visit and I agree with the Radiologist's read.   Interpretation by Radiologist:  CT Angio Abd/Pel W and/or Wo Contrast  Result Date: 07/02/2020 CLINICAL DATA:  Left upper quadrant pain EXAM: CTA ABDOMEN AND PELVIS WITHOUT AND WITH CONTRAST TECHNIQUE: Multidetector CT imaging of the abdomen and pelvis was performed using the standard protocol during bolus administration of intravenous contrast. Multiplanar reconstructed images and MIPs were obtained and reviewed to evaluate the vascular anatomy. CONTRAST:  11m OMNIPAQUE IOHEXOL 350 MG/ML SOLN COMPARISON:  09/26/2019 FINDINGS: VASCULAR  Aorta: Normal caliber aorta without aneurysm, dissection, vasculitis or significant stenosis. Celiac: Patent without evidence of aneurysm, dissection, vasculitis or significant stenosis. SMA: Patent without evidence of aneurysm, dissection, vasculitis or significant stenosis. Renals: Both renal arteries are patent without evidence of aneurysm, dissection, vasculitis, fibromuscular dysplasia or significant stenosis. IMA: Patent without evidence of aneurysm, dissection, vasculitis or significant stenosis. Inflow: Patent without evidence of aneurysm, dissection, vasculitis or significant stenosis. Proximal Outflow: Bilateral common femoral and visualized portions of the superficial and profunda femoral arteries are patent without evidence of aneurysm, dissection, vasculitis or significant stenosis. Veins: No obvious venous abnormality within the limitations of this arterial phase study. Review of the MIP images confirms the above findings. NON-VASCULAR Lower chest: Negative. Hepatobiliary: Severe diffuse fatty infiltration of the liver. No focal hepatic abnormality. Gallbladder unremarkable. Pancreas: No focal abnormality or ductal dilatation. Spleen: No focal abnormality.  Normal size. Adrenals/Urinary Tract: No adrenal abnormality. No focal renal abnormality. No stones or hydronephrosis. Urinary bladder is unremarkable. Stomach/Bowel: There is colonic wall thickening noted involving the cecum, ascending colon and transverse colon concerning for colitis. Stomach and small bowel decompressed. No bowel obstruction. Lymphatic: No adenopathy Reproductive: Uterus and adnexa unremarkable.  No mass. Other: No free fluid or free air. Musculoskeletal: No acute bony abnormality. IMPRESSION: VASCULAR No acute or significant vascular abnormality. NON-VASCULAR Severe diffuse fatty infiltration of the liver. Wall thickening in the right colon and transverse colon concerning for infectious or inflammatory colitis. Electronically  Signed   By: KRolm BaptiseM.D.   On: 07/02/2020 01:11      VRudene Re MD 07/02/20 0131

## 2020-07-02 NOTE — ED Notes (Signed)
Pharmacy messaged to verify pt medications.

## 2020-07-03 DIAGNOSIS — F10239 Alcohol dependence with withdrawal, unspecified: Secondary | ICD-10-CM | POA: Diagnosis not present

## 2020-07-03 DIAGNOSIS — K922 Gastrointestinal hemorrhage, unspecified: Secondary | ICD-10-CM | POA: Diagnosis present

## 2020-07-03 LAB — CBC WITH DIFFERENTIAL/PLATELET
Abs Immature Granulocytes: 0.03 10*3/uL (ref 0.00–0.07)
Basophils Absolute: 0 10*3/uL (ref 0.0–0.1)
Basophils Relative: 1 %
Eosinophils Absolute: 0.1 10*3/uL (ref 0.0–0.5)
Eosinophils Relative: 1 %
HCT: 38 % (ref 36.0–46.0)
Hemoglobin: 13.2 g/dL (ref 12.0–15.0)
Immature Granulocytes: 1 %
Lymphocytes Relative: 28 %
Lymphs Abs: 1.8 10*3/uL (ref 0.7–4.0)
MCH: 35.5 pg — ABNORMAL HIGH (ref 26.0–34.0)
MCHC: 34.7 g/dL (ref 30.0–36.0)
MCV: 102.2 fL — ABNORMAL HIGH (ref 80.0–100.0)
Monocytes Absolute: 0.5 10*3/uL (ref 0.1–1.0)
Monocytes Relative: 8 %
Neutro Abs: 3.8 10*3/uL (ref 1.7–7.7)
Neutrophils Relative %: 61 %
Platelets: 129 10*3/uL — ABNORMAL LOW (ref 150–400)
RBC: 3.72 MIL/uL — ABNORMAL LOW (ref 3.87–5.11)
RDW: 13.2 % (ref 11.5–15.5)
WBC: 6.2 10*3/uL (ref 4.0–10.5)
nRBC: 0 % (ref 0.0–0.2)

## 2020-07-03 LAB — VITAMIN B12: Vitamin B-12: 195 pg/mL (ref 180–914)

## 2020-07-03 LAB — COMPREHENSIVE METABOLIC PANEL
ALT: 72 U/L — ABNORMAL HIGH (ref 0–44)
AST: 133 U/L — ABNORMAL HIGH (ref 15–41)
Albumin: 3.7 g/dL (ref 3.5–5.0)
Alkaline Phosphatase: 129 U/L — ABNORMAL HIGH (ref 38–126)
Anion gap: 11 (ref 5–15)
BUN: <5 mg/dL — ABNORMAL LOW (ref 6–20)
CO2: 23 mmol/L (ref 22–32)
Calcium: 7.3 mg/dL — ABNORMAL LOW (ref 8.9–10.3)
Chloride: 102 mmol/L (ref 98–111)
Creatinine, Ser: 0.45 mg/dL (ref 0.44–1.00)
GFR, Estimated: >60 mL/min (ref >60)
Glucose, Bld: 94 mg/dL (ref 70–99)
Potassium: 3.5 mmol/L (ref 3.5–5.1)
Sodium: 136 mmol/L (ref 135–145)
Total Bilirubin: 2 mg/dL — ABNORMAL HIGH (ref 0.3–1.2)
Total Protein: 7 g/dL (ref 6.5–8.1)

## 2020-07-03 LAB — HEPATITIS PANEL, ACUTE
HCV Ab: NONREACTIVE
Hep A IgM: NONREACTIVE
Hep B C IgM: NONREACTIVE
Hepatitis B Surface Ag: NONREACTIVE

## 2020-07-03 LAB — MAGNESIUM: Magnesium: 2.4 mg/dL (ref 1.7–2.4)

## 2020-07-03 LAB — PHOSPHORUS: Phosphorus: 1.4 mg/dL — ABNORMAL LOW (ref 2.5–4.6)

## 2020-07-03 LAB — FOLATE: Folate: 7.2 ng/mL (ref >5.9)

## 2020-07-03 MED ORDER — K PHOS MONO-SOD PHOS DI & MONO 155-852-130 MG PO TABS
500.0000 mg | ORAL_TABLET | Freq: Four times a day (QID) | ORAL | Status: AC
  Administered 2020-07-03 – 2020-07-04 (×7): 500 mg via ORAL
  Filled 2020-07-03 (×8): qty 2

## 2020-07-03 MED ORDER — VITAMIN B-12 1000 MCG PO TABS
1000.0000 ug | ORAL_TABLET | Freq: Every day | ORAL | Status: DC
  Administered 2020-07-03 – 2020-07-07 (×5): 1000 ug via ORAL
  Filled 2020-07-03 (×5): qty 1

## 2020-07-03 NOTE — Plan of Care (Signed)

## 2020-07-03 NOTE — Plan of Care (Signed)
Continuing with plan of care. 

## 2020-07-03 NOTE — Progress Notes (Signed)
PROGRESS NOTE    [name]  RXV:[id number] DOB: 02-16-1986 DOA: 07/01/2020 PCP: Patient, No Pcp Per   Chief Complaint  Patient presents with   Emesis   Abdominal Pain    Brief Narrative:  Kristine Macias  is Kristine Macias 34 y.o. Caucasian female with Rodrecus Belsky known history of multiple medical problems that are mentioned below including alcohol abuse, alcoholic liver cirrhosis, Crohn's disease, depression and anxiety alcohol withdrawal seizures in hypertension, who presented to the emergency room with acute onset of coffee-ground emesis with associated nausea as well as diarrhea with loose bowel movements.  She describes black stools without bright red bleeding per rectum.  She denies any fever or chills.  She has been having mild generalized abdominal pain mainly in the epigastric area and right upper quadrant area.  Last alcoholic drink was this morning.  She stated that she drinks 4-5 beer per day.  No chest pain or palpitations.  No dysuria, oliguria or hematuria or flank pain.  She was having resting tremors.  Upon presentation to the emergency room, vital signs showed elevated blood pressure 141/101 and later 150/118 with heart rate 119.  Labs revealed potassium of 3.3 with Frans Valente calcium of 6.7 and magnesium 1.6.  CBC was unremarkable LFTs showed elevated AST of 211 and ALT of 86 with albumin 3.4 and total protein 6.5.  Urine pregnancy test was negative.  EKG showed sinus tachycardia with rate 113 CT angiography of the abdomen and pelvis showed wall thickening in the right colon and the transverse colon concerning for infectious or inflammatory colitis and severe diffuse fatty liver.  She was given IV Ativan and Zosyn as well as IV Protonix, p.o. potassium chloride 40 mEq and 10 mEq IV as well as Haldol 5 mg IV was placed on CIWA protocol.  She will be admitted to [facility] progressive unit bed for further evaluation and management.  Assessment & Plan:   Active Problems:   Alcohol withdrawal (HCC)   GI  bleeding   GI bleed  1.  Acute colitis   hx Crohn's disease - CT with wall thickening in R and transverse colon concerning for infectious/inflammatory colitis  - negative GI path panel and c diff - ddx includes infectious vs inflammatory (she has hx crohn's) -We will continue antibiotic therapy with IV Zosyn. -GI c/s, appreciate recs - per GI, needs outpatient GI follow up and needs colonoscopy  2.  GI bleeding. -reported coffee ground emesis and melena -she doesn't have any anemia, Hb relatively stable -Continue IV PPI  -GI consultation, now signed off  # Acute Metabolic Encephalopathy: 2/2 etoh withdrawal.  Follow ammonia, b12, folate. Delirium precautions High risk for complicated withdrawal, follow  # Elevated LFT's: likely 2/2 etoh use, follow.  Acute hepatitis panel negative.  Bilis slightly up today, follow.  3.  Hypokalemia   Hypophosphatemia -replace and follow   4.  Alcohol withdrawal. -continue ciwa protocol - she's having hallucinations, scoring high on CIWA  # Low normal B12 Follow MMA, supplement  DVT prophylaxis: SCD Code Status: full  Family Communication: none at bedside Disposition:   Status is: Inpatient  Remains inpatient appropriate because:Inpatient level of care appropriate due to severity of illness   Dispo: The patient is from: Home              Anticipated d/c is to: Home              Anticipated d/c date is: > 3 days  Patient currently is not medically stable to d/c.       Consultants:   GI  Procedures:  none  Antimicrobials:  Anti-infectives (From admission, onward)   Start     Dose/Rate Route Frequency Ordered Stop   07/02/20 0800  piperacillin-tazobactam (ZOSYN) IVPB 3.375 g        3.375 g 12.5 mL/hr over 240 Minutes Intravenous Every 8 hours 07/02/20 0754     07/02/20 0200  piperacillin-tazobactam (ZOSYN) IVPB 3.375 g  Status:  Discontinued        3.375 g 100 mL/hr over 30 Minutes Intravenous Every 6 hours  07/02/20 0157 07/02/20 0754   07/02/20 0115  piperacillin-tazobactam (ZOSYN) IVPB 3.375 g        3.375 g 100 mL/hr over 30 Minutes Intravenous  Once 07/02/20 0114 07/02/20 0213         Subjective: No new complaints Feels like she's withdrawing  She's been hallucinating  Objective: Vitals:   07/03/20 0100 07/03/20 0130 07/03/20 0301 07/03/20 1202  BP: (!) 138/98 (!) 137/97 139/90 (!) 141/94  Pulse: (!) 109 (!) 102 96 96  Resp: (!) 24 19 16 17   Temp:   98.3 F (36.8 C) 98.6 F (37 C)  TempSrc:   Oral Oral  SpO2: 98% 98% 94% 93%  Weight:      Height:   5' 6"  (1.676 m)     Intake/Output Summary (Last 24 hours) at 07/03/2020 1456 Last data filed at 07/03/2020 0502 Gross per 24 hour  Intake 1306.86 ml  Output 300 ml  Net 1006.86 ml   Filed Weights   07/01/20 1827  Weight: 68.9 kg    Examination:  General: No acute distress. Cardiovascular: Heart sounds show Carolan Avedisian regular rate, and rhythm Lungs: Clear to auscultation bilaterally . Abdomen: Soft, nontender, nondistended  Neurological: Alert and oriented. Moves all extremities 4. Cranial nerves II through XII grossly intact. Skin: Warm and dry. No rashes or lesions. Extremities: No clubbing or cyanosis. No edema.    Data Reviewed: I have personally reviewed following labs and imaging studies  CBC: Recent Labs  Lab 07/01/20 1831 07/02/20 0316 07/02/20 1517 07/03/20 0417  WBC 7.4 8.0  --  6.2  NEUTROABS  --   --   --  3.8  HGB 16.6* 14.3 12.6 13.2  HCT 46.3* 40.2 37.1 38.0  MCV 98.9 98.5  --  102.2*  PLT 234 168  --  129*    Basic Metabolic Panel: Recent Labs  Lab 07/01/20 1831 07/02/20 0316 07/03/20 0417  NA 139 140   141 136  K 2.3* 3.4*   3.3* 3.5  CL 97* 106   106 102  CO2 23 21*   21* 23  GLUCOSE 202* 113*   113* 94  BUN 5* <5*   6 <5*  CREATININE 0.55 0.45   0.49 0.45  CALCIUM 8.0* 6.7*   6.7* 7.3*  MG 2.0 1.6* 2.4  PHOS  --  2.8 1.4*    GFR: Estimated Creatinine Clearance: 92.8 mL/min (by  C-G formula based on SCr of 0.45 mg/dL).  Liver Function Tests: Recent Labs  Lab 07/01/20 1831 07/02/20 0316 07/03/20 0417  AST 245* 211* 133*  ALT 104* 86* 72*  ALKPHOS 132* 104 129*  BILITOT 0.9 1.0 2.0*  PROT 8.3* 6.5 7.0  ALBUMIN 4.2 3.4* 3.7    CBG: No results for input(s): GLUCAP in the last 168 hours.   Recent Results (from the past 240 hour(s))  Respiratory Panel by RT  PCR (Flu Jayliani Wanner&B, Covid) - Nasopharyngeal Swab     Status: None   Collection Time: 07/01/20  6:16 PM   Specimen: Nasopharyngeal Swab  Result Value Ref Range Status   SARS Coronavirus 2 by RT PCR NEGATIVE NEGATIVE Final    Comment: (NOTE) SARS-CoV-2 target nucleic acids are NOT DETECTED.  The SARS-CoV-2 RNA is generally detectable in upper respiratoy specimens during the acute phase of infection. The lowest concentration of SARS-CoV-2 viral copies this assay can detect is 131 copies/mL. Baylon Santelli negative result does not preclude SARS-Cov-2 infection and should not be used as the sole basis for treatment or other patient management decisions. Atira Borello negative result may occur with  improper specimen collection/handling, submission of specimen other than nasopharyngeal swab, presence of viral mutation(s) within the areas targeted by this assay, and inadequate number of viral copies (<131 copies/mL). Shaquela Weichert negative result must be combined with clinical observations, patient history, and epidemiological information. The expected result is Negative.  Fact Sheet for Patients:  PinkCheek.be  Fact Sheet for Healthcare Providers:  GravelBags.it  This test is no t yet approved or cleared by the [place] FDA and  has been authorized for detection and/or diagnosis of SARS-CoV-2 by FDA under an Emergency Use Authorization (EUA). This EUA will remain  in effect (meaning this test can be used) for the duration of the COVID-19 declaration under Section 564(b)(1) of the  Act, 21 U.S.C. section 360bbb-3(b)(1), unless the authorization is terminated or revoked sooner.     Influenza Flower Franko by PCR NEGATIVE NEGATIVE Final   Influenza B by PCR NEGATIVE NEGATIVE Final    Comment: (NOTE) The Xpert Xpress SARS-CoV-2/FLU/RSV assay is intended as an aid in  the diagnosis of influenza from Nasopharyngeal swab specimens and  should not be used as Aleysia Oltmann sole basis for treatment. Nasal washings and  aspirates are unacceptable for Xpert Xpress SARS-CoV-2/FLU/RSV  testing.  Fact Sheet for Patients: PinkCheek.be  Fact Sheet for Healthcare Providers: GravelBags.it  This test is not yet approved or cleared by the [place] FDA and  has been authorized for detection and/or diagnosis of SARS-CoV-2 by  FDA under an Emergency Use Authorization (EUA). This EUA will remain  in effect (meaning this test can be used) for the duration of the  Covid-19 declaration under Section 564(b)(1) of the Act, 21  U.S.C. section 360bbb-3(b)(1), unless the authorization is  terminated or revoked. Performed at [facility], [place], [place] [address]   C Difficile Quick Screen w PCR reflex     Status: None   Collection Time: 07/02/20 10:25 AM   Specimen: STOOL  Result Value Ref Range Status   C Diff antigen NEGATIVE NEGATIVE Final   C Diff toxin NEGATIVE NEGATIVE Final   C Diff interpretation No C. difficile detected.  Final    Comment: Performed at [facility], [address] [place], [place] [address]  Gastrointestinal Panel by PCR , Stool     Status: None   Collection Time: 07/02/20 10:25 AM   Specimen: STOOL  Result Value Ref Range Status   Campylobacter species NOT DETECTED NOT DETECTED Final   Plesimonas shigelloides NOT DETECTED NOT DETECTED Final   Salmonella species NOT DETECTED NOT DETECTED Final   Yersinia enterocolitica NOT DETECTED NOT DETECTED Final   Vibrio species NOT  DETECTED NOT DETECTED Final   Vibrio cholerae NOT DETECTED NOT DETECTED Final   Enteroaggregative E coli (EAEC) NOT DETECTED NOT DETECTED Final   Enteropathogenic E coli (EPEC) NOT  DETECTED NOT DETECTED Final   Enterotoxigenic E coli (ETEC) NOT DETECTED NOT DETECTED Final   Shiga like toxin producing E coli (STEC) NOT DETECTED NOT DETECTED Final   Shigella/Enteroinvasive E coli (EIEC) NOT DETECTED NOT DETECTED Final   Cryptosporidium NOT DETECTED NOT DETECTED Final   Cyclospora cayetanensis NOT DETECTED NOT DETECTED Final   Entamoeba histolytica NOT DETECTED NOT DETECTED Final   Giardia lamblia NOT DETECTED NOT DETECTED Final   Adenovirus F40/41 NOT DETECTED NOT DETECTED Final   Astrovirus NOT DETECTED NOT DETECTED Final   Norovirus GI/GII NOT DETECTED NOT DETECTED Final   Rotavirus Deletha Jaffee NOT DETECTED NOT DETECTED Final   Sapovirus (I, II, IV, and V) NOT DETECTED NOT DETECTED Final    Comment: Performed at [facility], [address], [place], [place] [address]         Radiology Studies: DG Chest Port 1 View  Result Date: 07/02/2020 CLINICAL DATA:  Vomiting, abdominal pain EXAM: PORTABLE CHEST 1 VIEW COMPARISON:  02/10/2018 chest radiograph. FINDINGS: Stable cardiomediastinal silhouette with normal heart size. No pneumothorax. No pleural effusion. Lungs appear clear, with no acute consolidative airspace disease and no pulmonary edema. IMPRESSION: No active disease. Electronically Signed   By: Ilona Sorrel M.D.   On: 07/02/2020 09:14   CT Angio Abd/Pel W and/or Wo Contrast  Result Date: 07/02/2020 CLINICAL DATA:  Left upper quadrant pain EXAM: CTA ABDOMEN AND PELVIS WITHOUT AND WITH CONTRAST TECHNIQUE: Multidetector CT imaging of the abdomen and pelvis was performed using the standard protocol during bolus administration of intravenous contrast. Multiplanar reconstructed images and MIPs were obtained and reviewed to evaluate the vascular anatomy. CONTRAST:  161m OMNIPAQUE IOHEXOL  350 MG/ML SOLN COMPARISON:  09/26/2019 FINDINGS: VASCULAR Aorta: Normal caliber aorta without aneurysm, dissection, vasculitis or significant stenosis. Celiac: Patent without evidence of aneurysm, dissection, vasculitis or significant stenosis. SMA: Patent without evidence of aneurysm, dissection, vasculitis or significant stenosis. Renals: Both renal arteries are patent without evidence of aneurysm, dissection, vasculitis, fibromuscular dysplasia or significant stenosis. IMA: Patent without evidence of aneurysm, dissection, vasculitis or significant stenosis. Inflow: Patent without evidence of aneurysm, dissection, vasculitis or significant stenosis. Proximal Outflow: Bilateral common femoral and visualized portions of the superficial and profunda femoral arteries are patent without evidence of aneurysm, dissection, vasculitis or significant stenosis. Veins: No obvious venous abnormality within the limitations of this arterial phase study. Review of the MIP images confirms the above findings. NON-VASCULAR Lower chest: Negative. Hepatobiliary: Severe diffuse fatty infiltration of the liver. No focal hepatic abnormality. Gallbladder unremarkable. Pancreas: No focal abnormality or ductal dilatation. Spleen: No focal abnormality.  Normal size. Adrenals/Urinary Tract: No adrenal abnormality. No focal renal abnormality. No stones or hydronephrosis. Urinary bladder is unremarkable. Stomach/Bowel: There is colonic wall thickening noted involving the cecum, ascending colon and transverse colon concerning for colitis. Stomach and small bowel decompressed. No bowel obstruction. Lymphatic: No adenopathy Reproductive: Uterus and adnexa unremarkable.  No mass. Other: No free fluid or free air. Musculoskeletal: No acute bony abnormality. IMPRESSION: VASCULAR No acute or significant vascular abnormality. NON-VASCULAR Severe diffuse fatty infiltration of the liver. Wall thickening in the right colon and transverse colon concerning  for infectious or inflammatory colitis. Electronically Signed   By: KRolm BaptiseM.D.   On: 07/02/2020 01:11        Scheduled Meds:  folic acid  1 mg Oral Daily   multivitamin with minerals  1 tablet Oral Daily   pantoprazole (PROTONIX) IV  40 mg Intravenous Q12H   phosphorus  500 mg Oral QID   [START ON 07/10/2020] thiamine  100 mg Oral Daily   vitamin B-12  1,000 mcg Oral Daily   Continuous Infusions:  0.9 % NaCl with KCl 20 mEq / L 100 mL/hr at 07/03/20 0320   piperacillin-tazobactam (ZOSYN)  IV 3.375 g (07/03/20 1026)   thiamine injection 500 mg (07/03/20 1319)   Followed by   Derrill Memo ON 07/05/2020] thiamine injection       LOS: 1 day    Time spent: over 57 min    Fayrene Helper, MD [facility]   To contact the attending provider between 7A-7P or the covering provider during after hours 7P-7A, please log into the web site [email or web address] and access using universal [place] password for that web site. If you do not have the password, please call the hospital operator.  07/03/2020, 2:56 PM

## 2020-07-03 NOTE — Progress Notes (Signed)
Patient transferred from ED to room 223 via stretcher around 3am. Oriented patient to room and room equipment. Explained to patient of having all four rails due to being on seizure precautions with her going through withdrawal symptoms. Patient appears calm, appropriate and stable but comes to the floor with a CIWA of 25 from the ED.  Prior to patient's admission, discussed with charge Nurse about admitting this patient with a very high CIWA and was instructed to reach out to the on-call provider to ensure stability of patient for this floor (2C). Provider reassessed and discussed with [facility] and went to look at patient for reassurance a gave approval for patient to continue to be admitted to the floor.  After assessing the patient, CIWA was now 24 and protocol to notify Rapid Response (RR). Notified RR. RR assessed the situation with Nurse after Nurse explained how CIWA result is 24 but with all of her active CIWA symptoms, the patient has a "stable-like" CIWA but could turn any moment. Patient appears to have a "laid back" demeanor but has all the symptoms including auditory and visual hallucinations. (Pt sees fairies). PRN Zofran was also given for the nausea but no emesis. After Ativan 68m per PRN order was given the first time, patient states her symptoms were beginning to subside. Patient also received a second dose of Ativan 256mprior to shift change. Reported to oncoming Nurse.

## 2020-07-03 NOTE — Progress Notes (Signed)
Vonda Antigua, MD [address], [place], [place], [place], [address] [address], [place], [place], [place], [address] Phone: [phone]  Fax: [phone]   Subjective:  Patient resting in bed comfortably.  Arousable, but lethargic.  Denies any abdominal pain.  Denies any further diarrhea  Objective: Exam: Vital signs in last 24 hours: Vitals:   07/03/20 0003 07/03/20 0100 07/03/20 0130 07/03/20 0301  BP: (!) 139/98 (!) 138/98 (!) 137/97 139/90  Pulse: (!) 107 (!) 109 (!) 102 96  Resp: 18 (!) 24 19 16   Temp:    98.3 F (36.8 C)  TempSrc:    Oral  SpO2: 96% 98% 98% 94%  Weight:      Height:    5' 6"  (1.676 m)   Weight change:   Intake/Output Summary (Last 24 hours) at 07/03/2020 1053 Last data filed at 07/03/2020 0502 Gross per 24 hour  Intake 2006.86 ml  Output 300 ml  Net 1706.86 ml    General: No acute distress Abd: Soft, NT/ND, No HSM Skin: Warm, no rashes Neck: Supple, Trachea midline   Lab Results: Lab Results  Component Value Date   WBC 6.2 07/03/2020   HGB 13.2 07/03/2020   HCT 38.0 07/03/2020   MCV 102.2 (H) 07/03/2020   PLT 129 (L) 07/03/2020   Micro Results: Recent Results (from the past 240 hour(s))  Respiratory Panel by RT PCR (Flu A&B, Covid) - Nasopharyngeal Swab     Status: None   Collection Time: 07/01/20  6:16 PM   Specimen: Nasopharyngeal Swab  Result Value Ref Range Status   SARS Coronavirus 2 by RT PCR NEGATIVE NEGATIVE Final    Comment: (NOTE) SARS-CoV-2 target nucleic acids are NOT DETECTED.  The SARS-CoV-2 RNA is generally detectable in upper respiratoy specimens during the acute phase of infection. The lowest concentration of SARS-CoV-2 viral copies this assay can detect is 131 copies/mL. A negative result does not preclude SARS-Cov-2 infection and should not be used as the sole basis for treatment or other patient management decisions. A negative result may occur with  improper specimen collection/handling,  submission of specimen other than nasopharyngeal swab, presence of viral mutation(s) within the areas targeted by this assay, and inadequate number of viral copies (<131 copies/mL). A negative result must be combined with clinical observations, patient history, and epidemiological information. The expected result is Negative.  Fact Sheet for Patients:  PinkCheek.be  Fact Sheet for Healthcare Providers:  GravelBags.it  This test is no t yet approved or cleared by the [place] FDA and  has been authorized for detection and/or diagnosis of SARS-CoV-2 by FDA under an Emergency Use Authorization (EUA). This EUA will remain  in effect (meaning this test can be used) for the duration of the COVID-19 declaration under Section 564(b)(1) of the Act, 21 U.S.C. section 360bbb-3(b)(1), unless the authorization is terminated or revoked sooner.     Influenza A by PCR NEGATIVE NEGATIVE Final   Influenza B by PCR NEGATIVE NEGATIVE Final    Comment: (NOTE) The Xpert Xpress SARS-CoV-2/FLU/RSV assay is intended as an aid in  the diagnosis of influenza from Nasopharyngeal swab specimens and  should not be used as a sole basis for treatment. Nasal washings and  aspirates are unacceptable for Xpert Xpress SARS-CoV-2/FLU/RSV  testing.  Fact Sheet for Patients: PinkCheek.be  Fact Sheet for Healthcare Providers: GravelBags.it  This test is not yet approved or cleared by the [place] FDA and  has been authorized for detection and/or diagnosis of SARS-CoV-2  by  FDA under an Emergency Use Authorization (EUA). This EUA will remain  in effect (meaning this test can be used) for the duration of the  Covid-19 declaration under Section 564(b)(1) of the Act, 21  U.S.C. section 360bbb-3(b)(1), unless the authorization is  terminated or revoked. Performed at [facility], [place], [place] [address]   C Difficile Quick Screen w PCR reflex     Status: None   Collection Time: 07/02/20 10:25 AM   Specimen: STOOL  Result Value Ref Range Status   C Diff antigen NEGATIVE NEGATIVE Final   C Diff toxin NEGATIVE NEGATIVE Final   C Diff interpretation No C. difficile detected.  Final    Comment: Performed at [facility], [address] [place], [place] [address]  Gastrointestinal Panel by PCR , Stool     Status: None   Collection Time: 07/02/20 10:25 AM   Specimen: STOOL  Result Value Ref Range Status   Campylobacter species NOT DETECTED NOT DETECTED Final   Plesimonas shigelloides NOT DETECTED NOT DETECTED Final   Salmonella species NOT DETECTED NOT DETECTED Final   Yersinia enterocolitica NOT DETECTED NOT DETECTED Final   Vibrio species NOT DETECTED NOT DETECTED Final   Vibrio cholerae NOT DETECTED NOT DETECTED Final   Enteroaggregative E coli (EAEC) NOT DETECTED NOT DETECTED Final   Enteropathogenic E coli (EPEC) NOT DETECTED NOT DETECTED Final   Enterotoxigenic E coli (ETEC) NOT DETECTED NOT DETECTED Final   Shiga like toxin producing E coli (STEC) NOT DETECTED NOT DETECTED Final   Shigella/Enteroinvasive E coli (EIEC) NOT DETECTED NOT DETECTED Final   Cryptosporidium NOT DETECTED NOT DETECTED Final   Cyclospora cayetanensis NOT DETECTED NOT DETECTED Final   Entamoeba histolytica NOT DETECTED NOT DETECTED Final   Giardia lamblia NOT DETECTED NOT DETECTED Final   Adenovirus F40/41 NOT DETECTED NOT DETECTED Final   Astrovirus NOT DETECTED NOT DETECTED Final   Norovirus GI/GII NOT DETECTED NOT DETECTED Final   Rotavirus A NOT DETECTED NOT DETECTED Final   Sapovirus (I, II, IV, and V) NOT DETECTED NOT DETECTED Final    Comment: Performed at [facility], [address] [place],  [address]   Studies/Results: DG Chest Port 1 View  Result Date: 07/02/2020 CLINICAL DATA:  Vomiting, abdominal pain EXAM: PORTABLE CHEST  1 VIEW COMPARISON:  02/10/2018 chest radiograph. FINDINGS: Stable cardiomediastinal silhouette with normal heart size. No pneumothorax. No pleural effusion. Lungs appear clear, with no acute consolidative airspace disease and no pulmonary edema. IMPRESSION: No active disease. Electronically Signed   By: Ilona Sorrel M.D.   On: 07/02/2020 09:14   CT Angio Abd/Pel W and/or Wo Contrast  Result Date: 07/02/2020 CLINICAL DATA:  Left upper quadrant pain EXAM: CTA ABDOMEN AND PELVIS WITHOUT AND WITH CONTRAST TECHNIQUE: Multidetector CT imaging of the abdomen and pelvis was performed using the standard protocol during bolus administration of intravenous contrast. Multiplanar reconstructed images and MIPs were obtained and reviewed to evaluate the vascular anatomy. CONTRAST:  131m OMNIPAQUE IOHEXOL 350 MG/ML SOLN COMPARISON:  09/26/2019 FINDINGS: VASCULAR Aorta: Normal caliber aorta without aneurysm, dissection, vasculitis or significant stenosis. Celiac: Patent without evidence of aneurysm, dissection, vasculitis or significant stenosis. SMA: Patent without evidence of aneurysm, dissection, vasculitis or significant stenosis. Renals: Both renal arteries are patent without evidence of aneurysm, dissection, vasculitis, fibromuscular dysplasia or significant stenosis. IMA: Patent without evidence of aneurysm, dissection, vasculitis or significant stenosis. Inflow: Patent without evidence of aneurysm, dissection, vasculitis or significant stenosis.  Proximal Outflow: Bilateral common femoral and visualized portions of the superficial and profunda femoral arteries are patent without evidence of aneurysm, dissection, vasculitis or significant stenosis. Veins: No obvious venous abnormality within the limitations of this arterial phase study. Review of the MIP images confirms the above findings. NON-VASCULAR Lower chest: Negative. Hepatobiliary: Severe diffuse fatty infiltration of the liver. No focal hepatic abnormality.  Gallbladder unremarkable. Pancreas: No focal abnormality or ductal dilatation. Spleen: No focal abnormality.  Normal size. Adrenals/Urinary Tract: No adrenal abnormality. No focal renal abnormality. No stones or hydronephrosis. Urinary bladder is unremarkable. Stomach/Bowel: There is colonic wall thickening noted involving the cecum, ascending colon and transverse colon concerning for colitis. Stomach and small bowel decompressed. No bowel obstruction. Lymphatic: No adenopathy Reproductive: Uterus and adnexa unremarkable.  No mass. Other: No free fluid or free air. Musculoskeletal: No acute bony abnormality. IMPRESSION: VASCULAR No acute or significant vascular abnormality. NON-VASCULAR Severe diffuse fatty infiltration of the liver. Wall thickening in the right colon and transverse colon concerning for infectious or inflammatory colitis. Electronically Signed   By: Rolm Baptise M.D.   On: 07/02/2020 01:11   Medications:  Scheduled Meds: . folic acid  1 mg Oral Daily  . multivitamin with minerals  1 tablet Oral Daily  . pantoprazole (PROTONIX) IV  40 mg Intravenous Q12H  . phosphorus  500 mg Oral QID  . [START ON 07/10/2020] thiamine  100 mg Oral Daily  . vitamin B-12  1,000 mcg Oral Daily   Continuous Infusions: . 0.9 % NaCl with KCl 20 mEq / L 100 mL/hr at 07/03/20 0320  . piperacillin-tazobactam (ZOSYN)  IV 3.375 g (07/03/20 1026)  . thiamine injection Stopped (07/03/20 0024)   Followed by  . [START ON 07/05/2020] thiamine injection     PRN Meds:.albuterol, LORazepam **OR** LORazepam, magnesium hydroxide, melatonin, ondansetron **OR** ondansetron (ZOFRAN) IV, traMADol   Assessment: Active Problems:   Alcohol withdrawal (HCC)   GI bleeding   GI bleed    Plan: Patient denies any more diarrhea over the last 24 hours.  Flowsheets also do not document any bowel movements over the last 24 hours.  Hemoglobin has been completely normal  Patient is now going to withdrawal symptoms.  Would  not recommend colonoscopy and prep given absence of any active bleeding, or any further diarrhea in the setting of withdrawal  However, patient should follow-up closely as an outpatient in GI clinic, with primary GI, and discussed need for colonoscopy due to abnormal CT (which was likely due to fasting infectious colitis symptoms that have now resolved).  Patient verbalized understanding  GI panel and C. difficile negative  GI service will sign off, please page GI on call with any questions or concerns   LOS: 1 day   Vonda Antigua, MD 07/03/2020, 10:53 AM

## 2020-07-04 ENCOUNTER — Ambulatory Visit: Payer: Self-pay | Admitting: Gastroenterology

## 2020-07-04 DIAGNOSIS — F10239 Alcohol dependence with withdrawal, unspecified: Secondary | ICD-10-CM | POA: Diagnosis not present

## 2020-07-04 LAB — CBC WITH DIFFERENTIAL/PLATELET
Abs Immature Granulocytes: 0.02 10*3/uL (ref 0.00–0.07)
Basophils Absolute: 0 10*3/uL (ref 0.0–0.1)
Basophils Relative: 1 %
Eosinophils Absolute: 0.1 10*3/uL (ref 0.0–0.5)
Eosinophils Relative: 2 %
HCT: 38.3 % (ref 36.0–46.0)
Hemoglobin: 13 g/dL (ref 12.0–15.0)
Immature Granulocytes: 0 %
Lymphocytes Relative: 30 %
Lymphs Abs: 1.9 10*3/uL (ref 0.7–4.0)
MCH: 35.3 pg — ABNORMAL HIGH (ref 26.0–34.0)
MCHC: 33.9 g/dL (ref 30.0–36.0)
MCV: 104.1 fL — ABNORMAL HIGH (ref 80.0–100.0)
Monocytes Absolute: 0.6 10*3/uL (ref 0.1–1.0)
Monocytes Relative: 9 %
Neutro Abs: 3.6 10*3/uL (ref 1.7–7.7)
Neutrophils Relative %: 58 %
Platelets: 121 10*3/uL — ABNORMAL LOW (ref 150–400)
RBC: 3.68 MIL/uL — ABNORMAL LOW (ref 3.87–5.11)
RDW: 13.1 % (ref 11.5–15.5)
WBC: 6.3 10*3/uL (ref 4.0–10.5)
nRBC: 0 % (ref 0.0–0.2)

## 2020-07-04 LAB — COMPREHENSIVE METABOLIC PANEL
ALT: 59 U/L — ABNORMAL HIGH (ref 0–44)
AST: 110 U/L — ABNORMAL HIGH (ref 15–41)
Albumin: 3.7 g/dL (ref 3.5–5.0)
Alkaline Phosphatase: 115 U/L (ref 38–126)
Anion gap: 11 (ref 5–15)
BUN: <5 mg/dL — ABNORMAL LOW (ref 6–20)
CO2: 22 mmol/L (ref 22–32)
Calcium: 7.3 mg/dL — ABNORMAL LOW (ref 8.9–10.3)
Chloride: 104 mmol/L (ref 98–111)
Creatinine, Ser: 0.41 mg/dL — ABNORMAL LOW (ref 0.44–1.00)
GFR, Estimated: >60 mL/min (ref >60)
Glucose, Bld: 76 mg/dL (ref 70–99)
Potassium: 3.8 mmol/L (ref 3.5–5.1)
Sodium: 137 mmol/L (ref 135–145)
Total Bilirubin: 1.5 mg/dL — ABNORMAL HIGH (ref 0.3–1.2)
Total Protein: 6.9 g/dL (ref 6.5–8.1)

## 2020-07-04 LAB — PHOSPHORUS: Phosphorus: 2.8 mg/dL (ref 2.5–4.6)

## 2020-07-04 LAB — MAGNESIUM: Magnesium: 2 mg/dL (ref 1.7–2.4)

## 2020-07-04 MED ORDER — SODIUM CHLORIDE 0.9 % IV SOLN
3.0000 g | Freq: Four times a day (QID) | INTRAVENOUS | Status: DC
  Administered 2020-07-05 – 2020-07-07 (×11): 3 g via INTRAVENOUS
  Filled 2020-07-04: qty 3
  Filled 2020-07-04: qty 8
  Filled 2020-07-04 (×4): qty 3
  Filled 2020-07-04 (×2): qty 8
  Filled 2020-07-04: qty 3
  Filled 2020-07-04 (×2): qty 8
  Filled 2020-07-04: qty 3
  Filled 2020-07-04 (×2): qty 8
  Filled 2020-07-04: qty 3

## 2020-07-04 MED ORDER — ENSURE ENLIVE PO LIQD
237.0000 mL | Freq: Two times a day (BID) | ORAL | Status: DC
  Administered 2020-07-05 – 2020-07-07 (×5): 237 mL via ORAL

## 2020-07-04 NOTE — Consult Note (Addendum)
Pharmacy Antibiotic Note  Analina Filla is a 34 y.o. female admitted on 07/01/2020 with Intra-abdominal Infection.  Pharmacy has been consulted for Unasyn dosing, per consult 7 day total course (including zosyn). Last dose of zosyn today at 1554 and today is day #3 of Zosyn.    Plan: Will order Unasyn 3g Q6H - first dose will start at 0000.  Height: 5' 6"  (167.6 cm) Weight: 68.9 kg (152 lb) IBW/kg (Calculated) : 59.3  Temp (24hrs), Avg:98.4 F (36.9 C), Min:97.8 F (36.6 C), Max:98.7 F (37.1 C)  Recent Labs  Lab 07/01/20 1831 07/02/20 0316 07/03/20 0417 07/04/20 0520  WBC 7.4 8.0 6.2 6.3  CREATININE 0.55 0.45  0.49 0.45 0.41*    Estimated Creatinine Clearance: 92.8 mL/min (A) (by C-G formula based on SCr of 0.41 mg/dL (L)).    Allergies  Allergen Reactions  . Aspirin Shortness Of Breath  . Effexor [Venlafaxine] Anaphylaxis  . Gabapentin Anaphylaxis  . Libritabs [Chlordiazepoxide] Anaphylaxis    Tolerates lorazepam and clonazepam  . Quetiapine Hives, Itching and Rash  . Sulfa Antibiotics Anaphylaxis  . Tetracyclines & Related Anaphylaxis  . Trazodone And Nefazodone Anaphylaxis  . Latex Hives and Itching  . Paxil [Paroxetine] Hives and Itching  . Seroquel [Quetiapine Fumarate] Hives and Itching  . Zoloft [Sertraline Hcl] Hives and Itching    Antimicrobials this admission: 11/6 Zosyn >> 11/8 11/8 Unasyn     Thank you for allowing pharmacy to be a part of this patient's care.  Rowland Lathe 07/04/2020 7:25 PM

## 2020-07-04 NOTE — Progress Notes (Signed)
PROGRESS NOTE    Kristine Macias  IZT:[id number] DOB: [birth year] DOA: 07/01/2020 PCP: Patient, No Pcp Per   Chief Complaint  Patient presents with   Emesis   Abdominal Pain    Brief Narrative:  Kristine Macias  is Kristine Macias 34 y.o. Caucasian female with Kristine Macias known history of multiple medical problems that are mentioned below including alcohol abuse, alcoholic liver cirrhosis, Crohn's disease, depression and anxiety alcohol withdrawal seizures in hypertension, who presented to the emergency room with acute onset of coffee-ground emesis with associated nausea as well as diarrhea with loose bowel movements.  She describes black stools without bright red bleeding per rectum.  She denies any fever or chills.  She has been having mild generalized abdominal pain mainly in the epigastric area and right upper quadrant area.  Last alcoholic drink was this morning.  She stated that she drinks 4-5 beer per day.  No chest pain or palpitations.  No dysuria, oliguria or hematuria or flank pain.  She was having resting tremors.  Upon presentation to the emergency room, vital signs showed elevated blood pressure 141/101 and later 150/118 with heart rate 119.  Labs revealed potassium of 3.3 with Kristine Macias calcium of 6.7 and magnesium 1.6.  CBC was unremarkable LFTs showed elevated AST of 211 and ALT of 86 with albumin 3.4 and total protein 6.5.  Urine pregnancy test was negative.  EKG showed sinus tachycardia with rate 113 CT angiography of the abdomen and pelvis showed wall thickening in the right colon and the transverse colon concerning for infectious or inflammatory colitis and severe diffuse fatty liver.  She was given IV Ativan and Zosyn as well as IV Protonix, p.o. potassium chloride 40 mEq and 10 mEq IV as well as Haldol 5 mg IV was placed on CIWA protocol.  She will be admitted to [facility] progressive unit bed for further evaluation and management.  Assessment & Plan:   Active Problems:   Alcohol withdrawal (HCC)   GI  bleeding   GI bleed  4.  Alcohol withdrawal. -continue ciwa protocol - she's having hallucinations, scoring high on CIWA  # Acute Metabolic Encephalopathy: 2/2 etoh withdrawal.  Follow ammonia (wnl), b12 (low normal), folate (wnl). Delirium precautions High risk for complicated withdrawal, follow  1.  Acute colitis   hx Crohn's disease - CT with wall thickening in R and transverse colon concerning for infectious/inflammatory colitis  - negative GI path panel and c diff - ddx includes infectious vs inflammatory (she has hx crohn's) -We will continue antibiotic therapy with IV Zosyn -> narrow to unasyn  -GI c/s, appreciate recs - per GI, needs outpatient GI follow up and needs colonoscopy  2.  GI bleeding. -reported coffee ground emesis and melena -she doesn't have any anemia, Hb relatively stable -Continue IV PPI  -GI consultation, now signed off  # Low Normal B12: replace, follow MMA  # Elevated LFT's: likely 2/2 etoh use, follow.  Acute hepatitis panel negative.  Bili downtrending  3.  Hypokalemia   Hypophosphatemia -replace and follow   # Low normal B12 Follow MMA, supplement  # Thrombocytopenia: likely related to etoh use, follow  DVT prophylaxis: SCD Code Status: full  Family Communication: none at bedside Disposition:   Status is: Inpatient  Remains inpatient appropriate because:Inpatient level of care appropriate due to severity of illness   Dispo: The patient is from: Home              Anticipated d/c is to: Home  Anticipated d/c date is: > 3 days              Patient currently is not medically stable to d/c.       Consultants:   GI  Procedures:  none  Antimicrobials:  Anti-infectives (From admission, onward)   Start     Dose/Rate Route Frequency Ordered Stop   07/02/20 0800  piperacillin-tazobactam (ZOSYN) IVPB 3.375 g        3.375 g 12.5 mL/hr over 240 Minutes Intravenous Every 8 hours 07/02/20 0754     07/02/20 0200   piperacillin-tazobactam (ZOSYN) IVPB 3.375 g  Status:  Discontinued        3.375 g 100 mL/hr over 30 Minutes Intravenous Every 6 hours 07/02/20 0157 07/02/20 0754   07/02/20 0115  piperacillin-tazobactam (ZOSYN) IVPB 3.375 g        3.375 g 100 mL/hr over 30 Minutes Intravenous  Once 07/02/20 0114 07/02/20 0213         Subjective: No new complaints Continues to have hallucinations  Objective: Vitals:   07/04/20 0341 07/04/20 0923 07/04/20 1137 07/04/20 1500  BP: (!) 136/96 (!) 127/94 (!) 126/93 (!) 138/103  Pulse: 98 97 91 81  Resp: 20 14 16 20   Temp: 98.7 F (37.1 C) 97.8 F (36.6 C) 98.2 F (36.8 C) 98.1 F (36.7 C)  TempSrc: Oral  Oral Oral  SpO2: 98% 99% 99% 100%  Weight:      Height:        Intake/Output Summary (Last 24 hours) at 07/04/2020 1917 Last data filed at 07/04/2020 0227 Gross per 24 hour  Intake --  Output 701 ml  Net -701 ml   Filed Weights   07/01/20 1827  Weight: 68.9 kg    Examination:  General: No acute distress. Cardiovascular: Heart sounds show Kristine Macias regular rate, and rhythm Lungs: Clear to auscultation bilaterally Abdomen: Soft, nontender, nondistended  Neurological: Sleepy, but awakens and answers questions appropriately. Moves all extremities 4. Cranial nerves II through XII grossly intact. Skin: Warm and dry. No rashes or lesions. Extremities: No clubbing or cyanosis. No edema.  Data Reviewed: I have personally reviewed following labs and imaging studies  CBC: Recent Labs  Lab 07/01/20 1831 07/02/20 0316 07/02/20 1517 07/03/20 0417 07/04/20 0520  WBC 7.4 8.0  --  6.2 6.3  NEUTROABS  --   --   --  3.8 3.6  HGB 16.6* 14.3 12.6 13.2 13.0  HCT 46.3* 40.2 37.1 38.0 38.3  MCV 98.9 98.5  --  102.2* 104.1*  PLT 234 168  --  129* 121*    Basic Metabolic Panel: Recent Labs  Lab 07/01/20 1831 07/02/20 0316 07/03/20 0417 07/04/20 0520  NA 139 140   141 136 137  K 2.3* 3.4*   3.3* 3.5 3.8  CL 97* 106   106 102 104  CO2 23 21*    21* 23 22  GLUCOSE 202* 113*   113* 94 76  BUN 5* <5*   6 <5* <5*  CREATININE 0.55 0.45   0.49 0.45 0.41*  CALCIUM 8.0* 6.7*   6.7* 7.3* 7.3*  MG 2.0 1.6* 2.4 2.0  PHOS  --  2.8 1.4* 2.8    GFR: Estimated Creatinine Clearance: 92.8 mL/min (Kristine Macias) (by C-G formula based on SCr of 0.41 mg/dL (L)).  Liver Function Tests: Recent Labs  Lab 07/01/20 1831 07/02/20 0316 07/03/20 0417 07/04/20 0520  AST 245* 211* 133* 110*  ALT 104* 86* 72* 59*  ALKPHOS 132* 104 129*  115  BILITOT 0.9 1.0 2.0* 1.5*  PROT 8.3* 6.5 7.0 6.9  ALBUMIN 4.2 3.4* 3.7 3.7    CBG: No results for input(s): GLUCAP in the last 168 hours.   Recent Results (from the past 240 hour(s))  Respiratory Panel by RT PCR (Flu Kristine Macias&B, Covid) - Nasopharyngeal Swab     Status: None   Collection Time: 07/01/20  6:16 PM   Specimen: Nasopharyngeal Swab  Result Value Ref Range Status   SARS Coronavirus 2 by RT PCR NEGATIVE NEGATIVE Final    Comment: (NOTE) SARS-CoV-2 target nucleic acids are NOT DETECTED.  The SARS-CoV-2 RNA is generally detectable in upper respiratoy specimens during the acute phase of infection. The lowest concentration of SARS-CoV-2 viral copies this assay can detect is 131 copies/mL. Kristine Macias negative result does not preclude SARS-Cov-2 infection and should not be used as the sole basis for treatment or other patient management decisions. Kristine Macias negative result may occur with  improper specimen collection/handling, submission of specimen other than nasopharyngeal swab, presence of viral mutation(s) within the areas targeted by this assay, and inadequate number of viral copies (<131 copies/mL). Kristine Macias negative result must be combined with clinical observations, patient history, and epidemiological information. The expected result is Negative.  Fact Sheet for Patients:  Kristine Macias  Fact Sheet for Healthcare Providers:  Kristine Macias  This test is no t yet  approved or cleared by the [place] FDA and  has been authorized for detection and/or diagnosis of SARS-CoV-2 by FDA under an Emergency Use Authorization (EUA). This EUA will remain  in effect (meaning this test can be used) for the duration of the COVID-19 declaration under Section 564(b)(1) of the Act, 21 U.S.C. section 360bbb-3(b)(1), unless the authorization is terminated or revoked sooner.     Influenza Neeva Trew by PCR NEGATIVE NEGATIVE Final   Influenza B by PCR NEGATIVE NEGATIVE Final    Comment: (NOTE) The Xpert Xpress SARS-CoV-2/FLU/RSV assay is intended as an aid in  the diagnosis of influenza from Nasopharyngeal swab specimens and  should not be used as Kristine Macias sole basis for treatment. Nasal washings and  aspirates are unacceptable for Xpert Xpress SARS-CoV-2/FLU/RSV  testing.  Fact Sheet for Patients: Kristine Macias  Fact Sheet for Healthcare Providers: Kristine Macias  This test is not yet approved or cleared by the [place] FDA and  has been authorized for detection and/or diagnosis of SARS-CoV-2 by  FDA under an Emergency Use Authorization (EUA). This EUA will remain  in effect (meaning this test can be used) for the duration of the  Covid-19 declaration under Section 564(b)(1) of the Act, 21  U.S.C. section 360bbb-3(b)(1), unless the authorization is  terminated or revoked. Performed at [facility], [place], [place] [address]   C Difficile Quick Screen w PCR reflex     Status: None   Collection Time: 07/02/20 10:25 AM   Specimen: STOOL  Result Value Ref Range Status   C Diff antigen NEGATIVE NEGATIVE Final   C Diff toxin NEGATIVE NEGATIVE Final   C Diff interpretation No C. difficile detected.  Final    Comment: Performed at [facility], [address] [place], [place] [address]  Gastrointestinal Panel by PCR , Stool     Status: None   Collection Time: 07/02/20 10:25 AM    Specimen: STOOL  Result Value Ref Range Status   Campylobacter species NOT DETECTED NOT DETECTED Final   Plesimonas shigelloides NOT DETECTED NOT DETECTED Final   Salmonella species NOT DETECTED NOT  DETECTED Final   Yersinia enterocolitica NOT DETECTED NOT DETECTED Final   Vibrio species NOT DETECTED NOT DETECTED Final   Vibrio cholerae NOT DETECTED NOT DETECTED Final   Enteroaggregative E coli (EAEC) NOT DETECTED NOT DETECTED Final   Enteropathogenic E coli (EPEC) NOT DETECTED NOT DETECTED Final   Enterotoxigenic E coli (ETEC) NOT DETECTED NOT DETECTED Final   Shiga like toxin producing E coli (STEC) NOT DETECTED NOT DETECTED Final   Shigella/Enteroinvasive E coli (EIEC) NOT DETECTED NOT DETECTED Final   Cryptosporidium NOT DETECTED NOT DETECTED Final   Cyclospora cayetanensis NOT DETECTED NOT DETECTED Final   Entamoeba histolytica NOT DETECTED NOT DETECTED Final   Giardia lamblia NOT DETECTED NOT DETECTED Final   Adenovirus F40/41 NOT DETECTED NOT DETECTED Final   Astrovirus NOT DETECTED NOT DETECTED Final   Norovirus GI/GII NOT DETECTED NOT DETECTED Final   Rotavirus Amara Justen NOT DETECTED NOT DETECTED Final   Sapovirus (I, II, IV, and V) NOT DETECTED NOT DETECTED Final    Comment: Performed at [facility], [address], [place], [place] [address]         Radiology Studies: No results found.      Scheduled Meds:  [START ON 07/05/2020] feeding supplement  237 mL Oral BID BM   folic acid  1 mg Oral Daily   multivitamin with minerals  1 tablet Oral Daily   pantoprazole (PROTONIX) IV  40 mg Intravenous Q12H   phosphorus  500 mg Oral QID   [START ON 07/10/2020] thiamine  100 mg Oral Daily   vitamin B-12  1,000 mcg Oral Daily   Continuous Infusions:  0.9 % NaCl with KCl 20 mEq / L 100 mL/hr at 07/04/20 1252   piperacillin-tazobactam (ZOSYN)  IV 3.375 g (07/04/20 1554)   thiamine injection 500 mg (07/04/20 1502)   Followed by   Derrill Memo ON 07/05/2020]  thiamine injection       LOS: 2 days    Time spent: over 41 min    Fayrene Helper, MD [facility]   To contact the attending provider between 7A-7P or the covering provider during after hours 7P-7A, please log into the web site [email or web address] and access using universal [place] password for that web site. If you do not have the password, please call the hospital operator.  07/04/2020, 7:17 PM

## 2020-07-04 NOTE — Progress Notes (Addendum)
Initial Nutrition Assessment  DOCUMENTATION CODES:   Not applicable  INTERVENTION:  Provide Ensure Enlive po BID, each supplement provides 350 kcal and 20 grams of protein.  Continue MVI po daily.  Encouraged adequate intake of protein. Discussed foods that are high in protein patient can tolerate.  NUTRITION DIAGNOSIS:   Inadequate oral intake related to decreased appetite as evidenced by per patient/family report.  GOAL:   Patient will meet greater than or equal to 90% of their needs  MONITOR:   PO intake, Supplement acceptance, Labs, Weight trends, I & O's  REASON FOR ASSESSMENT:   Malnutrition Screening Tool    ASSESSMENT:   34 year old female with PMHx of Crohn's disease, EtOH use, liver cirrhosis, depression, anxiety, hx of FTT in adult and malnutrition related to N/V and inadequate intake requiring PEG tube plaement on 01/11/2015 (now s/p removal) admitted with acute colitis, GI bleeding, acute metabolic encephalopathy, EtOH withdrawal.   Met with patient at bedside. She is known to this RD from previous admission in 2019. Patient reports her appetite has been decreased for a while now. She eats 1-2 meals per day. Patient reports she does not eat much meat anymore and mainly eats vegetarian/plant-based foods with occasional intake of fish. For breakfast she may have bagel with ham and cream cheese, cheese and vegetable omelet, or peanut butter toast. Her other meal may be a ham and cheese sandwich, rice with mushrooms and sauce, or occasionally fish tacos. Patient also has been drinking Kefir for probiotics and this also contains approximately 11 grams of protein per serving. She has been trying to increase protein intake in her diet. She is amenable to drinking oral nutrition supplements to help meet calorie/protein needs. Patient avoids foods with seeds related to her Crohn's. Patient's PEG was placed in May 2016 and was removed 1.5 years later while she was pregnant with  her daughter as she was eating better and maintaining her weight.   Patient reports her UBW is now between 145-155 lbs. Patient is currently 68.9 kg (152 lbs). In 2019 patient was 54.8 kg so she has been steadily gaining weight back after previous weight loss in 2016 (was down to 76-95 lbs then).  Medications reviewed and include: folic acid 1 mg daily, MVI daily, Protonix, K Phos Neutral 500 mg QID PO, thiamine 500 mg Q8hrs IV from 11/6-11/9 followed by thiamine 250 mg IV daily from 11/9-11/14 followed by thiamine 100 mg PO daily, vitamin B12 1000 micrograms daily, NS with KCl 20 mEq/L at 100 mL/hr. Zosyn.  Labs reviewed: BUN <5, Creatinine 0.41.  Patient does not meet criteria for malnutrition at this time but is at risk for malnutrition.  NUTRITION - FOCUSED PHYSICAL EXAM:    Most Recent Value  Orbital Region No depletion  Upper Arm Region Mild depletion  Thoracic and Lumbar Region No depletion  Buccal Region No depletion  Temple Region No depletion  Clavicle Bone Region Mild depletion  Clavicle and Acromion Bone Region No depletion  Scapular Bone Region No depletion  Dorsal Hand No depletion  Patellar Region Mild depletion  Anterior Thigh Region Mild depletion  Posterior Calf Region Moderate depletion  Edema (RD Assessment) None  Hair Reviewed  Eyes Reviewed  Mouth Reviewed  Skin Reviewed  [healed site of previous PEG tube left lower abdomen]  Nails Reviewed     Diet Order:   Diet Order            DIET SOFT Room service appropriate? Yes; Fluid consistency:  Thin  Diet effective now                EDUCATION NEEDS:   Education needs have been addressed  Skin:  Skin Assessment: Reviewed RN Assessment  Last BM:  07/04/2020 - medium type 6  Height:   Ht Readings from Last 1 Encounters:  07/03/20 _0  (1.676 m)   Weight:   Wt Readings from Last 1 Encounters:  07/01/20 68.9 kg   BMI:  Body mass index is 24.53 kg/m.  Estimated Nutritional Needs:   Kcal:   1850-2100  Protein:  95-105 grams  Fluid:  2-2.2 L/day  Jacklynn Barnacle, MS, RD, LDN Pager number available on Amion

## 2020-07-05 DIAGNOSIS — R109 Unspecified abdominal pain: Secondary | ICD-10-CM | POA: Diagnosis not present

## 2020-07-05 LAB — PHOSPHORUS
Phosphorus: 3.3 mg/dL (ref 2.5–4.6)
Phosphorus: 3 mg/dL (ref 2.5–4.6)

## 2020-07-05 LAB — COMPREHENSIVE METABOLIC PANEL
ALT: 54 U/L — ABNORMAL HIGH (ref 0–44)
ALT: 67 U/L — ABNORMAL HIGH (ref 0–44)
AST: 93 U/L — ABNORMAL HIGH (ref 15–41)
AST: 126 U/L — ABNORMAL HIGH (ref 15–41)
Albumin: 3.5 g/dL (ref 3.5–5.0)
Albumin: 3.7 g/dL (ref 3.5–5.0)
Alkaline Phosphatase: 106 U/L (ref 38–126)
Alkaline Phosphatase: 114 U/L (ref 38–126)
Anion gap: 10 (ref 5–15)
Anion gap: 9 (ref 5–15)
BUN: <5 mg/dL — ABNORMAL LOW (ref 6–20)
BUN: <5 mg/dL — ABNORMAL LOW (ref 6–20)
CO2: 22 mmol/L (ref 22–32)
CO2: 21 mmol/L — ABNORMAL LOW (ref 22–32)
Calcium: 8.1 mg/dL — ABNORMAL LOW (ref 8.9–10.3)
Calcium: 9 mg/dL (ref 8.9–10.3)
Chloride: 106 mmol/L (ref 98–111)
Chloride: 104 mmol/L (ref 98–111)
Creatinine, Ser: 0.48 mg/dL (ref 0.44–1.00)
Creatinine, Ser: 0.37 mg/dL — ABNORMAL LOW (ref 0.44–1.00)
GFR, Estimated: >60 mL/min (ref >60)
GFR, Estimated: >60 mL/min (ref >60)
Glucose, Bld: 79 mg/dL (ref 70–99)
Glucose, Bld: 117 mg/dL — ABNORMAL HIGH (ref 70–99)
Potassium: 4.2 mmol/L (ref 3.5–5.1)
Potassium: 4.3 mmol/L (ref 3.5–5.1)
Sodium: 138 mmol/L (ref 135–145)
Sodium: 134 mmol/L — ABNORMAL LOW (ref 135–145)
Total Bilirubin: 1 mg/dL (ref 0.3–1.2)
Total Bilirubin: 1.1 mg/dL (ref 0.3–1.2)
Total Protein: 6.9 g/dL (ref 6.5–8.1)
Total Protein: 7.4 g/dL (ref 6.5–8.1)

## 2020-07-05 LAB — CBC WITH DIFFERENTIAL/PLATELET
Abs Immature Granulocytes: 0.01 10*3/uL (ref 0.00–0.07)
Basophils Absolute: 0 10*3/uL (ref 0.0–0.1)
Basophils Relative: 1 %
Eosinophils Absolute: 0.1 10*3/uL (ref 0.0–0.5)
Eosinophils Relative: 2 %
HCT: 39.1 % (ref 36.0–46.0)
Hemoglobin: 13.4 g/dL (ref 12.0–15.0)
Immature Granulocytes: 0 %
Lymphocytes Relative: 26 %
Lymphs Abs: 1.9 10*3/uL (ref 0.7–4.0)
MCH: 35.7 pg — ABNORMAL HIGH (ref 26.0–34.0)
MCHC: 34.3 g/dL (ref 30.0–36.0)
MCV: 104.3 fL — ABNORMAL HIGH (ref 80.0–100.0)
Monocytes Absolute: 0.7 10*3/uL (ref 0.1–1.0)
Monocytes Relative: 9 %
Neutro Abs: 4.7 10*3/uL (ref 1.7–7.7)
Neutrophils Relative %: 62 %
Platelets: 143 10*3/uL — ABNORMAL LOW (ref 150–400)
RBC: 3.75 MIL/uL — ABNORMAL LOW (ref 3.87–5.11)
RDW: 13 % (ref 11.5–15.5)
WBC: 7.5 10*3/uL (ref 4.0–10.5)
nRBC: 0 % (ref 0.0–0.2)

## 2020-07-05 LAB — CBC
HCT: 39.7 % (ref 36.0–46.0)
Hemoglobin: 13.6 g/dL (ref 12.0–15.0)
MCH: 35.4 pg — ABNORMAL HIGH (ref 26.0–34.0)
MCHC: 34.3 g/dL (ref 30.0–36.0)
MCV: 103.4 fL — ABNORMAL HIGH (ref 80.0–100.0)
Platelets: 141 10*3/uL — ABNORMAL LOW (ref 150–400)
RBC: 3.84 MIL/uL — ABNORMAL LOW (ref 3.87–5.11)
RDW: 13.1 % (ref 11.5–15.5)
WBC: 7.2 10*3/uL (ref 4.0–10.5)
nRBC: 0 % (ref 0.0–0.2)

## 2020-07-05 LAB — MAGNESIUM
Magnesium: 1.9 mg/dL (ref 1.7–2.4)
Magnesium: 1.6 mg/dL — ABNORMAL LOW (ref 1.7–2.4)

## 2020-07-05 MED ORDER — LORAZEPAM 2 MG/ML IJ SOLN
1.0000 mg | INTRAMUSCULAR | Status: DC | PRN
  Administered 2020-07-07: 1 mg via INTRAVENOUS
  Filled 2020-07-05: qty 1

## 2020-07-05 MED ORDER — PANTOPRAZOLE SODIUM 40 MG PO TBEC
40.0000 mg | DELAYED_RELEASE_TABLET | Freq: Two times a day (BID) | ORAL | Status: DC
  Administered 2020-07-05 – 2020-07-07 (×4): 40 mg via ORAL
  Filled 2020-07-05 (×4): qty 1

## 2020-07-05 MED ORDER — LORAZEPAM 1 MG PO TABS
1.0000 mg | ORAL_TABLET | ORAL | Status: DC | PRN
  Administered 2020-07-05 (×2): 1 mg via ORAL
  Filled 2020-07-05 (×2): qty 1

## 2020-07-05 MED ORDER — LORAZEPAM 1 MG PO TABS
1.0000 mg | ORAL_TABLET | ORAL | Status: DC | PRN
  Administered 2020-07-05 – 2020-07-07 (×8): 1 mg via ORAL
  Filled 2020-07-05 (×8): qty 1

## 2020-07-05 MED ORDER — LORAZEPAM 2 MG/ML IJ SOLN: 1.0000 mg | INTRAMUSCULAR | Status: DC | PRN

## 2020-07-05 NOTE — Progress Notes (Signed)
Physical Therapy Evaluation Patient Details Name: Kristine Macias MRN: [id number] DOB: [birth year] Today's Date: 07/05/2020   History of Present Illness  Per MD note:Kristine Macias  is a 34 y.o. Caucasian female with a known history of multiple medical problems that are mentioned below including alcohol abuse, alcoholic liver cirrhosis, Crohn's disease, depression and anxiety alcohol withdrawal seizures in hypertension, who presented to the emergency room with acute onset of coffee-ground emesis with associated nausea as well as diarrhea with loose bowel movements.  She describes black stools without bright red bleeding per rectum.  She denies any fever or chills.  She has been having mild generalized abdominal pain mainly in the epigastric area and right upper quadrant area.  Last alcoholic drink was this morning.  She stated that she drinks 4-5 beer per day.  No chest pain or palpitations.  No dysuria, oliguria or hematuria or flank pain.  She was having resting tremors.  Clinical Impression  Patient agrees to PT eval. She has 3/5 BLE hip flex and knee extension strength. She has good sitting balance and good standing balance.   She is MI with bed mobility, MI assist for transfers sit to stand. She ambulates  300 feet with MI. She has fatigue following gait. Patient will continue to benefit from skilled PT to improve mobility and strength.    Follow Up Recommendations No PT follow up    Equipment Recommendations  None recommended by PT    Recommendations for Other Services       Precautions / Restrictions Restrictions Weight Bearing Restrictions: No      Mobility  Bed Mobility Overal bed mobility: Modified Independent                  Transfers Overall transfer level: Modified independent Equipment used: None             General transfer comment: vc for safety  Ambulation/Gait Ambulation/Gait assistance: Modified independent (Device/Increase time) Gait Distance  (Feet): 300 Feet Assistive device: 1 person hand held assist Gait Pattern/deviations: Step-through pattern Gait velocity: decreased      Stairs            Wheelchair Mobility    Modified Rankin (Stroke Patients Only)       Balance Overall balance assessment: Mild deficits observed, not formally tested                                           Pertinent Vitals/Pain Pain Assessment: Faces Faces Pain Scale: Hurts a little bit    Home Living Family/patient expects to be discharged to:: Private residence Living Arrangements: Spouse/significant other Available Help at Discharge: Family Type of Home: House Home Access: Stairs to enter   Technical brewer of Steps: 2 Home Layout: One level Home Equipment: None      Prior Function                 Hand Dominance        Extremity/Trunk Assessment   Upper Extremity Assessment Upper Extremity Assessment: Overall WFL for tasks assessed            Communication   Communication: No difficulties  Cognition Arousal/Alertness: Awake/alert Behavior During Therapy: Flat affect Overall Cognitive Status: Within Functional Limits for tasks assessed  General Comments      Exercises     Assessment/Plan    PT Assessment Patient needs continued PT services  PT Problem List Decreased strength;Decreased activity tolerance;Decreased mobility       PT Treatment Interventions Gait training;Therapeutic activities;Therapeutic exercise    PT Goals (Current goals can be found in the Care Plan section)  Acute Rehab PT Goals Patient Stated Goal: to walk better PT Goal Formulation: With patient Time For Goal Achievement: 07/19/20 Potential to Achieve Goals: Fair    Frequency Min 2X/week   Barriers to discharge        Co-evaluation               AM-PAC PT "6 Clicks" Mobility  Outcome Measure Help needed turning from your  back to your side while in a flat bed without using bedrails?: None Help needed moving from lying on your back to sitting on the side of a flat bed without using bedrails?: None Help needed moving to and from a bed to a chair (including a wheelchair)?: A Little Help needed standing up from a chair using your arms (e.g., wheelchair or bedside chair)?: A Little Help needed to walk in hospital room?: A Little Help needed climbing 3-5 steps with a railing? : A Little 6 Click Score: 20    End of Session Equipment Utilized During Treatment: Gait belt Activity Tolerance: Patient tolerated treatment well Patient left: in bed;with bed alarm set Nurse Communication: Mobility status PT Visit Diagnosis: Unsteadiness on feet (R26.81);Muscle weakness (generalized) (M62.81);Difficulty in walking, not elsewhere classified (R26.2)    Time: 5638-7564 PT Time Calculation (min) (ACUTE ONLY): 25 min   Charges:   PT Evaluation $PT Eval Low Complexity: 1 Low PT Treatments $Gait Training: 8-22 mins $Therapeutic Activity: 8-22 mins          Alanson Puls, PT DPT 07/05/2020, 3:39 PM

## 2020-07-05 NOTE — Progress Notes (Signed)
PROGRESS NOTE    Kristine Macias  WHQ:[id number] DOB: 29-Jul-1986 DOA: 07/01/2020 PCP: Patient, No Pcp Per   Chief Complaint  Patient presents with  . Emesis  . Abdominal Pain    Brief Narrative:  Kristine Macias  is Kristine Macias 34 y.o. Caucasian female with Kristine Macias known history of multiple medical problems that are mentioned below including alcohol abuse, alcoholic liver cirrhosis, Crohn's disease, depression and anxiety alcohol withdrawal seizures in hypertension, who presented to the emergency room with acute onset of coffee-ground emesis with associated nausea as well as diarrhea with loose bowel movements.  She had Kristine Macias CT scan with findings c/w colitis.  She was admitted for concern for GI bleed, colitis, as well as etoh withdrawal.  She's been seen by GI who recommends outpatient follow up given her stable Hb/Hct and no reported GI bleeding since admission.  She's on abx for her colitis.  Currently having complicated etoh withdrawal with hallucinations, gradually improving.  Hopefully she'll be able to discharge in 24-48 hrs.  Assessment & Plan:   Active Problems:   Alcohol withdrawal (HCC)   GI bleeding   GI bleed  4.  Alcohol withdrawal. -continue ciwa protocol - she's having hallucinations, continues to score highly on CIWA, but this is gradually improving  # Acute Metabolic Encephalopathy  Hallucinations: 2/2 etoh withdrawal.  Follow ammonia (wnl), b12 (low normal), folate (wnl). Delirium precautions  1.  Acute colitis  hx Crohn's disease - CT with wall thickening in R and transverse colon concerning for infectious/inflammatory colitis  - negative GI path panel and c diff - ddx includes infectious vs inflammatory (she has hx crohn's) -We will continue antibiotic therapy with IV Zosyn -> narrow to unasyn --- plan for 7 day course of abx -GI c/s, appreciate recs - per GI, needs outpatient GI follow up and needs colonoscopy  2.  GI bleeding. -reported coffee ground emesis and  melena -she doesn't have any anemia, Hb relatively stable -Continue IV PPI -> will transition to PO -GI consultation, now signed off - recommending outpatient follow up   # Low Normal B12: replace, follow MMA  # Elevated LFT's: likely 2/2 etoh use, follow.  Acute hepatitis panel negative.  Bili downtrending  3.  Hypokalemia  Hypophosphatemia -replace and follow   # Low normal B12 Follow MMA, supplement  # Thrombocytopenia: likely related to etoh use, follow  DVT prophylaxis: SCD Code Status: full  Family Communication: none at bedside Disposition:   Status is: Inpatient  Remains inpatient appropriate because:Inpatient level of care appropriate due to severity of illness   Dispo: The patient is from: Home              Anticipated d/c is to: Home              Anticipated d/c date is: > 3 days              Patient currently is not medically stable to d/c.       Consultants:   GI  Procedures:  none  Antimicrobials:  Anti-infectives (From admission, onward)   Start     Dose/Rate Route Frequency Ordered Stop   07/05/20 0000  Ampicillin-Sulbactam (UNASYN) 3 g in sodium chloride 0.9 % 100 mL IVPB        3 g 200 mL/hr over 30 Minutes Intravenous Every 6 hours 07/04/20 1930 07/08/20 2359   07/02/20 0800  piperacillin-tazobactam (ZOSYN) IVPB 3.375 g  Status:  Discontinued  3.375 g 12.5 mL/hr over 240 Minutes Intravenous Every 8 hours 07/02/20 0754 07/04/20 1920   07/02/20 0200  piperacillin-tazobactam (ZOSYN) IVPB 3.375 g  Status:  Discontinued        3.375 g 100 mL/hr over 30 Minutes Intravenous Every 6 hours 07/02/20 0157 07/02/20 0754   07/02/20 0115  piperacillin-tazobactam (ZOSYN) IVPB 3.375 g        3.375 g 100 mL/hr over 30 Minutes Intravenous  Once 07/02/20 0114 07/02/20 0213         Subjective: No new complaints Continued tremors, hallucinations - improving Only started eating yesterday  Objective: Vitals:   07/05/20 0629 07/05/20 0727  07/05/20 1112 07/05/20 1537  BP: (!) 124/97 (!) 153/112 (!) 147/89 (!) 132/104  Pulse:  77 80 81  Resp:  18 15 15   Temp:  98.5 F (36.9 C) 98 F (36.7 C) 98 F (36.7 C)  TempSrc:  Oral    SpO2:  100% 100% 98%  Weight:      Height:        Intake/Output Summary (Last 24 hours) at 07/05/2020 1657 Last data filed at 07/05/2020 0915 Gross per 24 hour  Intake 3703.23 ml  Output 2 ml  Net 3701.23 ml   Filed Weights   07/01/20 1827  Weight: 68.9 kg    Examination:  General: No acute distress. Cardiovascular: Heart sounds show Kristine Macias regular rate, and rhythm Lungs: Clear to auscultation bilaterally. Abdomen: Soft, nontender, nondistended Neurological: Alert and oriented 3. Moves all extremities 4. Cranial nerves II through XII grossly intact. Skin: Warm and dry. No rashes or lesions. Extremities: No clubbing or cyanosis. No edema.   Data Reviewed: I have personally reviewed following labs and imaging studies  CBC: Recent Labs  Lab 07/01/20 1831 07/01/20 1831 07/02/20 0316 07/02/20 1517 07/03/20 0417 07/04/20 0520 07/05/20 0443  WBC 7.4  --  8.0  --  6.2 6.3 7.5  NEUTROABS  --   --   --   --  3.8 3.6 4.7  HGB 16.6*   < > 14.3 12.6 13.2 13.0 13.4  HCT 46.3*   < > 40.2 37.1 38.0 38.3 39.1  MCV 98.9  --  98.5  --  102.2* 104.1* 104.3*  PLT 234  --  168  --  129* 121* 143*   < > = values in this interval not displayed.    Basic Metabolic Panel: Recent Labs  Lab 07/01/20 1831 07/02/20 0316 07/03/20 0417 07/04/20 0520 07/05/20 0443  NA 139 140  141 136 137 138  K 2.3* 3.4*  3.3* 3.5 3.8 4.2  CL 97* 106  106 102 104 106  CO2 23 21*  21* 23 22 22   GLUCOSE 202* 113*  113* 94 76 79  BUN 5* <5*  6 <5* <5* <5*  CREATININE 0.55 0.45  0.49 0.45 0.41* 0.48  CALCIUM 8.0* 6.7*  6.7* 7.3* 7.3* 8.1*  MG 2.0 1.6* 2.4 2.0 1.9  PHOS  --  2.8 1.4* 2.8 3.3    GFR: Estimated Creatinine Clearance: 92.8 mL/min (by C-G formula based on SCr of 0.48 mg/dL).  Liver Function  Tests: Recent Labs  Lab 07/01/20 1831 07/02/20 0316 07/03/20 0417 07/04/20 0520 07/05/20 0443  AST 245* 211* 133* 110* 93*  ALT 104* 86* 72* 59* 54*  ALKPHOS 132* 104 129* 115 106  BILITOT 0.9 1.0 2.0* 1.5* 1.0  PROT 8.3* 6.5 7.0 6.9 6.9  ALBUMIN 4.2 3.4* 3.7 3.7 3.5    CBG: No results for input(s):  GLUCAP in the last 168 hours.   Recent Results (from the past 240 hour(s))  Respiratory Panel by RT PCR (Flu Kristine Macias&B, Covid) - Nasopharyngeal Swab     Status: None   Collection Time: 07/01/20  6:16 PM   Specimen: Nasopharyngeal Swab  Result Value Ref Range Status   SARS Coronavirus 2 by RT PCR NEGATIVE NEGATIVE Final    Comment: (NOTE) SARS-CoV-2 target nucleic acids are NOT DETECTED.  The SARS-CoV-2 RNA is generally detectable in upper respiratoy specimens during the acute phase of infection. The lowest concentration of SARS-CoV-2 viral copies this assay can detect is 131 copies/mL. Kristine Macias negative result does not preclude SARS-Cov-2 infection and should not be used as the sole basis for treatment or other patient management decisions. Kristine Macias negative result may occur with  improper specimen collection/handling, submission of specimen other than nasopharyngeal swab, presence of viral mutation(s) within the areas targeted by this assay, and inadequate number of viral copies (<131 copies/mL). Kristine Macias negative result must be combined with clinical observations, patient history, and epidemiological information. The expected result is Negative.  Fact Sheet for Patients:  PinkCheek.be  Fact Sheet for Healthcare Providers:  GravelBags.it  This test is no t yet approved or cleared by the [place] FDA and  has been authorized for detection and/or diagnosis of SARS-CoV-2 by FDA under an Emergency Use Authorization (EUA). This EUA will remain  in effect (meaning this test can be used) for the duration of the COVID-19 declaration under  Section 564(b)(1) of the Act, 21 U.S.C. section 360bbb-3(b)(1), unless the authorization is terminated or revoked sooner.     Influenza Kristine Macias by PCR NEGATIVE NEGATIVE Final   Influenza B by PCR NEGATIVE NEGATIVE Final    Comment: (NOTE) The Xpert Xpress SARS-CoV-2/FLU/RSV assay is intended as an aid in  the diagnosis of influenza from Nasopharyngeal swab specimens and  should not be used as Kristine Macias sole basis for treatment. Nasal washings and  aspirates are unacceptable for Xpert Xpress SARS-CoV-2/FLU/RSV  testing.  Fact Sheet for Patients: PinkCheek.be  Fact Sheet for Healthcare Providers: GravelBags.it  This test is not yet approved or cleared by the [place] FDA and  has been authorized for detection and/or diagnosis of SARS-CoV-2 by  FDA under an Emergency Use Authorization (EUA). This EUA will remain  in effect (meaning this test can be used) for the duration of the  Covid-19 declaration under Section 564(b)(1) of the Act, 21  U.S.C. section 360bbb-3(b)(1), unless the authorization is  terminated or revoked. Performed at [facility], [place], [place] [address]   C Difficile Quick Screen w PCR reflex     Status: None   Collection Time: 07/02/20 10:25 AM   Specimen: STOOL  Result Value Ref Range Status   C Diff antigen NEGATIVE NEGATIVE Final   C Diff toxin NEGATIVE NEGATIVE Final   C Diff interpretation No C. difficile detected.  Final    Comment: Performed at [facility], [address] [place], [place] [address]  Gastrointestinal Panel by PCR , Stool     Status: None   Collection Time: 07/02/20 10:25 AM   Specimen: STOOL  Result Value Ref Range Status   Campylobacter species NOT DETECTED NOT DETECTED Final   Plesimonas shigelloides NOT DETECTED NOT DETECTED Final   Salmonella species NOT DETECTED NOT DETECTED Final   Yersinia enterocolitica NOT DETECTED NOT DETECTED Final    Vibrio species NOT DETECTED NOT DETECTED Final   Vibrio cholerae NOT DETECTED NOT  DETECTED Final   Enteroaggregative E coli (EAEC) NOT DETECTED NOT DETECTED Final   Enteropathogenic E coli (EPEC) NOT DETECTED NOT DETECTED Final   Enterotoxigenic E coli (ETEC) NOT DETECTED NOT DETECTED Final   Shiga like toxin producing E coli (STEC) NOT DETECTED NOT DETECTED Final   Shigella/Enteroinvasive E coli (EIEC) NOT DETECTED NOT DETECTED Final   Cryptosporidium NOT DETECTED NOT DETECTED Final   Cyclospora cayetanensis NOT DETECTED NOT DETECTED Final   Entamoeba histolytica NOT DETECTED NOT DETECTED Final   Giardia lamblia NOT DETECTED NOT DETECTED Final   Adenovirus F40/41 NOT DETECTED NOT DETECTED Final   Astrovirus NOT DETECTED NOT DETECTED Final   Norovirus GI/GII NOT DETECTED NOT DETECTED Final   Rotavirus Kristine Macias NOT DETECTED NOT DETECTED Final   Sapovirus (I, II, IV, and V) NOT DETECTED NOT DETECTED Final    Comment: Performed at [facility], [address] [place], [place] [address]         Radiology Studies: No results found.      Scheduled Meds: . feeding supplement  237 mL Oral BID BM  . folic acid  1 mg Oral Daily  . multivitamin with minerals  1 tablet Oral Daily  . pantoprazole (PROTONIX) IV  40 mg Intravenous Q12H  . [START ON 07/10/2020] thiamine  100 mg Oral Daily  . vitamin B-12  1,000 mcg Oral Daily   Continuous Infusions: . 0.9 % NaCl with KCl 20 mEq / L 100 mL/hr at 07/05/20 0908  . ampicillin-sulbactam (UNASYN) IV 3 g (07/05/20 1639)  . thiamine injection       LOS: 3 days    Time spent: over 30 min    Fayrene Helper, MD [facility]   To contact the attending provider between 7A-7P or the covering provider during after hours 7P-7A, please log into the web site [email or web address] and access using universal [place] password for that web site. If you do not have the password, please call the hospital operator.  07/05/2020, 4:57 PM

## 2020-07-06 DIAGNOSIS — R1084 Generalized abdominal pain: Secondary | ICD-10-CM | POA: Diagnosis not present

## 2020-07-06 DIAGNOSIS — F10230 Alcohol dependence with withdrawal, uncomplicated: Secondary | ICD-10-CM

## 2020-07-06 LAB — CBC WITH DIFFERENTIAL/PLATELET
Abs Immature Granulocytes: 0.01 10*3/uL (ref 0.00–0.07)
Basophils Absolute: 0 10*3/uL (ref 0.0–0.1)
Basophils Relative: 0 %
Eosinophils Absolute: 0.1 10*3/uL (ref 0.0–0.5)
Eosinophils Relative: 2 %
HCT: 39.2 % (ref 36.0–46.0)
Hemoglobin: 13.5 g/dL (ref 12.0–15.0)
Immature Granulocytes: 0 %
Lymphocytes Relative: 31 %
Lymphs Abs: 2.2 10*3/uL (ref 0.7–4.0)
MCH: 35.6 pg — ABNORMAL HIGH (ref 26.0–34.0)
MCHC: 34.4 g/dL (ref 30.0–36.0)
MCV: 103.4 fL — ABNORMAL HIGH (ref 80.0–100.0)
Monocytes Absolute: 0.7 10*3/uL (ref 0.1–1.0)
Monocytes Relative: 11 %
Neutro Abs: 3.9 10*3/uL (ref 1.7–7.7)
Neutrophils Relative %: 56 %
Platelets: 142 10*3/uL — ABNORMAL LOW (ref 150–400)
RBC: 3.79 MIL/uL — ABNORMAL LOW (ref 3.87–5.11)
RDW: 12.9 % (ref 11.5–15.5)
WBC: 7 10*3/uL (ref 4.0–10.5)
nRBC: 0 % (ref 0.0–0.2)

## 2020-07-06 LAB — COMPREHENSIVE METABOLIC PANEL
ALT: 60 U/L — ABNORMAL HIGH (ref 0–44)
AST: 97 U/L — ABNORMAL HIGH (ref 15–41)
Albumin: 3.6 g/dL (ref 3.5–5.0)
Alkaline Phosphatase: 106 U/L (ref 38–126)
Anion gap: 8 (ref 5–15)
BUN: <5 mg/dL — ABNORMAL LOW (ref 6–20)
CO2: 24 mmol/L (ref 22–32)
Calcium: 8.8 mg/dL — ABNORMAL LOW (ref 8.9–10.3)
Chloride: 105 mmol/L (ref 98–111)
Creatinine, Ser: 0.37 mg/dL — ABNORMAL LOW (ref 0.44–1.00)
GFR, Estimated: >60 mL/min (ref >60)
Glucose, Bld: 98 mg/dL (ref 70–99)
Potassium: 4 mmol/L (ref 3.5–5.1)
Sodium: 137 mmol/L (ref 135–145)
Total Bilirubin: 1 mg/dL (ref 0.3–1.2)
Total Protein: 7.1 g/dL (ref 6.5–8.1)

## 2020-07-06 LAB — MAGNESIUM: Magnesium: 1.8 mg/dL (ref 1.7–2.4)

## 2020-07-06 LAB — PHOSPHORUS: Phosphorus: 4.1 mg/dL (ref 2.5–4.6)
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
## 2020-07-06 NOTE — Progress Notes (Signed)
PROGRESS NOTE    Kristine Macias  TDS:[id number] DOB: February 15, 1986 DOA: 07/01/2020 PCP: Patient, No Pcp Per   Brief Narrative:  SandraJohnstonis a33 y.o.Caucasian femalewith a known history of multiple medical problems that are mentioned below including alcohol abuse, alcoholic liver cirrhosis, Crohn's disease, depression and anxiety alcohol withdrawal seizures in hypertension, who presented to the emergency room with acute onset of coffee-ground emesis with associated nausea as well as diarrhea with loose bowel movements. She had a CT scan with findings c/w colitis.  She was admitted for concern for GI bleed, colitis, as well as etoh withdrawal.  She's been seen by GI who recommends outpatient follow up given her stable Hb/Hct and no reported GI bleeding since admission.  She's on abx for her colitis.  Currently having complicated etoh withdrawal with hallucinations, gradually improving.   Assessment & Plan:  Alcohol withdrawal: -continue ciwa protocol -Improving -Continue thiamine and folic acid.  Acute Metabolic Encephalopathy  Hallucinations: 2/2 etoh withdrawal.  -ammonia (wnl), b12 (low normal), folate (wnl). Delirium precautions  Acute colitis  hx Crohn's disease - CT with wall thickening in R and transverse colon concerning for infectious/inflammatory colitis  - negative GI path panel and c diff - ddx includes infectious vs inflammatory (she has hx crohn's) -We will continue antibiotic therapy with IV Zosyn -> narrow to unasyn --- plan for 7 day course of abx -GI c/s, appreciate recs - per GI, needs outpatient GI follow up and needs colonoscopy -Consulted dietitian as per patient's request  GI bleeding. -reported coffee ground emesis and melena -she doesn't have any anemia, Hb relatively stable -Continue p.o. PPI -GI consultation, now signed off - recommending outpatient follow up   Macrocytosis/low Normal B12: replace, follow MMA  Elevated LFT's: likely  2/2 etoh use, follow.  Acute hepatitis panel negative.   -Improving.  Total bilirubin: WNL.  Hypokalemia  Hypophosphatemia -Replaced  Thrombocytopenia:  -Likely in the setting of ethanol abuse.  Platelet: 142.  No signs of active range.  Continue to monitor.  Depression: -Consulted psych as per patient's request  DVT prophylaxis: SCD Code Status: Full code Family Communication: None present at bedside.  Plan of care discussed with patient in length and she verbalized understanding and agreed with it. Disposition Plan: Likely home tomorrow  Consultants:  GI Procedures:   None  Antimicrobials:  Unasyn  Status is: Inpatient  Dispo: The patient is from: Home              Anticipated d/c is to: Home              Anticipated d/c date is: 07/07/2028              Patient currently not medically stable for the discharge.   Subjective: Patient seen and examined.  Sitting comfortably on the bed.  Tells me that she feels better however she thinks that she is not comfortable going home today.   denies abdominal pain, nausea, vomiting or diarrhea.  Objective: Vitals:   07/05/20 1954 07/06/20 0432 07/06/20 0711 07/06/20 1056  BP: (!) 169/90 (!) 123/94 134/90 139/88  Pulse: 81 91 87 (!) 101  Resp: 20 20 18 17   Temp: 98.3 F (36.8 C) 97.9 F (36.6 C) 97.6 F (36.4 C) 97.7 F (36.5 C)  TempSrc: Oral Oral Oral Oral  SpO2: 98% 98% 98% 100%  Weight:      Height:        Intake/Output Summary (Last 24 hours) at 07/06/2020 1455 Last data filed at  07/06/2020 1430 Gross per 24 hour  Intake 360 ml  Output --  Net 360 ml   Filed Weights   07/01/20 1827  Weight: 68.9 kg    Examination:  General exam: Appears calm and comfortable  Respiratory system: Clear to auscultation. Respiratory effort normal. Cardiovascular system: S1 & S2 heard, RRR. No JVD, murmurs, rubs, gallops or clicks. No pedal edema. Gastrointestinal system: Abdomen is nondistended, soft and nontender. No  organomegaly or masses felt. Normal bowel sounds heard. Central nervous system: Alert and oriented. No focal neurological deficits. Extremities: Symmetric 5 x 5 power. Skin: No rashes, lesions or ulcers   Data Reviewed: I have personally reviewed following labs and imaging studies  CBC: Recent Labs  Lab 07/03/20 0417 07/04/20 0520 07/05/20 0443 07/05/20 1748 07/06/20 0415  WBC 6.2 6.3 7.5 7.2 7.0  NEUTROABS 3.8 3.6 4.7  --  3.9  HGB 13.2 13.0 13.4 13.6 13.5  HCT 38.0 38.3 39.1 39.7 39.2  MCV 102.2* 104.1* 104.3* 103.4* 103.4*  PLT 129* 121* 143* 141* 115*   Basic Metabolic Panel: Recent Labs  Lab 07/03/20 0417 07/04/20 0520 07/05/20 0443 07/05/20 1748 07/06/20 0415  NA 136 137 138 134* 137  K 3.5 3.8 4.2 4.3 4.0  CL 102 104 106 104 105  CO2 23 22 22  21* 24  GLUCOSE 94 76 79 117* 98  BUN <5* <5* <5* <5* <5*  CREATININE 0.45 0.41* 0.48 0.37* 0.37*  CALCIUM 7.3* 7.3* 8.1* 9.0 8.8*  MG 2.4 2.0 1.9 1.6* 1.8  PHOS 1.4* 2.8 3.3 3.0 4.1   GFR: Estimated Creatinine Clearance: 92.8 mL/min (A) (by C-G formula based on SCr of 0.37 mg/dL (L)). Liver Function Tests: Recent Labs  Lab 07/03/20 0417 07/04/20 0520 07/05/20 0443 07/05/20 1748 07/06/20 0415  AST 133* 110* 93* 126* 97*  ALT 72* 59* 54* 67* 60*  ALKPHOS 129* 115 106 114 106  BILITOT 2.0* 1.5* 1.0 1.1 1.0  PROT 7.0 6.9 6.9 7.4 7.1  ALBUMIN 3.7 3.7 3.5 3.7 3.6   Recent Labs  Lab 07/01/20 1831  LIPASE 30   Recent Labs  Lab 07/02/20 1517  AMMONIA 21   Coagulation Profile: No results for input(s): INR, PROTIME in the last 168 hours. Cardiac Enzymes: No results for input(s): CKTOTAL, CKMB, CKMBINDEX, TROPONINI in the last 168 hours. BNP (last 3 results) No results for input(s): PROBNP in the last 8760 hours. HbA1C: No results for input(s): HGBA1C in the last 72 hours. CBG: No results for input(s): GLUCAP in the last 168 hours. Lipid Profile: No results for input(s): CHOL, HDL, LDLCALC, TRIG,  CHOLHDL, LDLDIRECT in the last 72 hours. Thyroid Function Tests: No results for input(s): TSH, T4TOTAL, FREET4, T3FREE, THYROIDAB in the last 72 hours. Anemia Panel: No results for input(s): VITAMINB12, FOLATE, FERRITIN, TIBC, IRON, RETICCTPCT in the last 72 hours. Sepsis Labs: No results for input(s): PROCALCITON, LATICACIDVEN in the last 168 hours.  Recent Results (from the past 240 hour(s))  Respiratory Panel by RT PCR (Flu A&B, Covid) - Nasopharyngeal Swab     Status: None   Collection Time: 07/01/20  6:16 PM   Specimen: Nasopharyngeal Swab  Result Value Ref Range Status   SARS Coronavirus 2 by RT PCR NEGATIVE NEGATIVE Final    Comment: (NOTE) SARS-CoV-2 target nucleic acids are NOT DETECTED.  The SARS-CoV-2 RNA is generally detectable in upper respiratoy specimens during the acute phase of infection. The lowest concentration of SARS-CoV-2 viral copies this assay can detect is 131 copies/mL. A negative  result does not preclude SARS-Cov-2 infection and should not be used as the sole basis for treatment or other patient management decisions. A negative result may occur with  improper specimen collection/handling, submission of specimen other than nasopharyngeal swab, presence of viral mutation(s) within the areas targeted by this assay, and inadequate number of viral copies (<131 copies/mL). A negative result must be combined with clinical observations, patient history, and epidemiological information. The expected result is Negative.  Fact Sheet for Patients:  PinkCheek.be  Fact Sheet for Healthcare Providers:  GravelBags.it  This test is no t yet approved or cleared by the [place] FDA and  has been authorized for detection and/or diagnosis of SARS-CoV-2 by FDA under an Emergency Use Authorization (EUA). This EUA will remain  in effect (meaning this test can be used) for the duration of the COVID-19 declaration  under Section 564(b)(1) of the Act, 21 U.S.C. section 360bbb-3(b)(1), unless the authorization is terminated or revoked sooner.     Influenza A by PCR NEGATIVE NEGATIVE Final   Influenza B by PCR NEGATIVE NEGATIVE Final    Comment: (NOTE) The Xpert Xpress SARS-CoV-2/FLU/RSV assay is intended as an aid in  the diagnosis of influenza from Nasopharyngeal swab specimens and  should not be used as a sole basis for treatment. Nasal washings and  aspirates are unacceptable for Xpert Xpress SARS-CoV-2/FLU/RSV  testing.  Fact Sheet for Patients: PinkCheek.be  Fact Sheet for Healthcare Providers: GravelBags.it  This test is not yet approved or cleared by the [place] FDA and  has been authorized for detection and/or diagnosis of SARS-CoV-2 by  FDA under an Emergency Use Authorization (EUA). This EUA will remain  in effect (meaning this test can be used) for the duration of the  Covid-19 declaration under Section 564(b)(1) of the Act, 21  U.S.C. section 360bbb-3(b)(1), unless the authorization is  terminated or revoked. Performed at [facility], [place], [place] [address]   C Difficile Quick Screen w PCR reflex     Status: None   Collection Time: 07/02/20 10:25 AM   Specimen: STOOL  Result Value Ref Range Status   C Diff antigen NEGATIVE NEGATIVE Final   C Diff toxin NEGATIVE NEGATIVE Final   C Diff interpretation No C. difficile detected.  Final    Comment: Performed at [facility], [address] [place], [place] [address]  Gastrointestinal Panel by PCR , Stool     Status: None   Collection Time: 07/02/20 10:25 AM   Specimen: STOOL  Result Value Ref Range Status   Campylobacter species NOT DETECTED NOT DETECTED Final   Plesimonas shigelloides NOT DETECTED NOT DETECTED Final   Salmonella species NOT DETECTED NOT DETECTED Final   Yersinia enterocolitica NOT DETECTED NOT DETECTED  Final   Vibrio species NOT DETECTED NOT DETECTED Final   Vibrio cholerae NOT DETECTED NOT DETECTED Final   Enteroaggregative E coli (EAEC) NOT DETECTED NOT DETECTED Final   Enteropathogenic E coli (EPEC) NOT DETECTED NOT DETECTED Final   Enterotoxigenic E coli (ETEC) NOT DETECTED NOT DETECTED Final   Shiga like toxin producing E coli (STEC) NOT DETECTED NOT DETECTED Final   Shigella/Enteroinvasive E coli (EIEC) NOT DETECTED NOT DETECTED Final   Cryptosporidium NOT DETECTED NOT DETECTED Final   Cyclospora cayetanensis NOT DETECTED NOT DETECTED Final   Entamoeba histolytica NOT DETECTED NOT DETECTED Final   Giardia lamblia NOT DETECTED NOT DETECTED Final   Adenovirus F40/41 NOT DETECTED NOT DETECTED Final   Astrovirus NOT  DETECTED NOT DETECTED Final   Norovirus GI/GII NOT DETECTED NOT DETECTED Final   Rotavirus A NOT DETECTED NOT DETECTED Final   Sapovirus (I, II, IV, and V) NOT DETECTED NOT DETECTED Final    Comment: Performed at [facility], [address], [place], [place] [address]      Radiology Studies: No results found.  Scheduled Meds: . feeding supplement  237 mL Oral BID BM  . folic acid  1 mg Oral Daily  . multivitamin with minerals  1 tablet Oral Daily  . pantoprazole  40 mg Oral BID  . [START ON 07/10/2020] thiamine  100 mg Oral Daily  . vitamin B-12  1,000 mcg Oral Daily   Continuous Infusions: . 0.9 % NaCl with KCl 20 mEq / L 100 mL/hr at 07/06/20 0620  . ampicillin-sulbactam (UNASYN) IV 3 g (07/06/20 1211)  . thiamine injection 250 mg (07/06/20 1011)     LOS: 4 days   Time spent: 35 minutes  Hiroki Wint Loann Quill, MD [facility]  If 7PM-7AM, please contact night-coverage [email or web address] 07/06/2020, 2:55 PM

## 2020-07-07 DIAGNOSIS — F10239 Alcohol dependence with withdrawal, unspecified: Secondary | ICD-10-CM

## 2020-07-07 DIAGNOSIS — Z8659 Personal history of other mental and behavioral disorders: Secondary | ICD-10-CM

## 2020-07-07 LAB — COMPREHENSIVE METABOLIC PANEL
ALT: 67 U/L — ABNORMAL HIGH (ref 0–44)
AST: 101 U/L — ABNORMAL HIGH (ref 15–41)
Albumin: 3.5 g/dL (ref 3.5–5.0)
Alkaline Phosphatase: 101 U/L (ref 38–126)
Anion gap: 9 (ref 5–15)
BUN: <5 mg/dL — ABNORMAL LOW (ref 6–20)
CO2: 24 mmol/L (ref 22–32)
Calcium: 9.3 mg/dL (ref 8.9–10.3)
Chloride: 105 mmol/L (ref 98–111)
Creatinine, Ser: 0.41 mg/dL — ABNORMAL LOW (ref 0.44–1.00)
GFR, Estimated: >60 mL/min (ref >60)
Glucose, Bld: 96 mg/dL (ref 70–99)
Potassium: 3.8 mmol/L (ref 3.5–5.1)
Sodium: 138 mmol/L (ref 135–145)
Total Bilirubin: 0.8 mg/dL (ref 0.3–1.2)
Total Protein: 7 g/dL (ref 6.5–8.1)

## 2020-07-07 MED ORDER — LORAZEPAM 1 MG PO TABS: ORAL_TABLET | ORAL | 0 refills | Status: DC

## 2020-07-07 MED ORDER — BOOST / RESOURCE BREEZE PO LIQD CUSTOM
1.0000 | Freq: Three times a day (TID) | ORAL | Status: DC
  Administered 2020-07-07: 1 via ORAL

## 2020-07-07 MED ORDER — TRAZODONE HCL 100 MG PO TABS: 100.0000 mg | ORAL_TABLET | Freq: Every evening | ORAL | Status: DC | PRN

## 2020-07-07 MED ORDER — THIAMINE HCL 100 MG PO TABS: 100.0000 mg | ORAL_TABLET | Freq: Every day | ORAL | 0 refills | Status: AC

## 2020-07-07 MED ORDER — FOLIC ACID 1 MG PO TABS: 1.0000 mg | ORAL_TABLET | Freq: Every day | ORAL | 0 refills | Status: AC

## 2020-07-07 MED ORDER — CYANOCOBALAMIN 1000 MCG PO TABS: 1000.0000 ug | ORAL_TABLET | Freq: Every day | ORAL | 0 refills | Status: AC

## 2020-07-07 MED ORDER — TRAZODONE HCL 100 MG PO TABS: 100.0000 mg | ORAL_TABLET | Freq: Every evening | ORAL | 1 refills | Status: AC | PRN

## 2020-07-07 MED ORDER — ONDANSETRON HCL 4 MG PO TABS: 4.0000 mg | ORAL_TABLET | Freq: Four times a day (QID) | ORAL | 0 refills | Status: DC | PRN

## 2020-07-07 MED ORDER — AMOXICILLIN-POT CLAVULANATE 875-125 MG PO TABS: 1.0000 | ORAL_TABLET | Freq: Two times a day (BID) | ORAL | 0 refills | Status: AC

## 2020-07-07 MED ORDER — ADULT MULTIVITAMIN W/MINERALS CH: 1.0000 | ORAL_TABLET | Freq: Every day | ORAL | 0 refills | Status: AC

## 2020-07-07 NOTE — Progress Notes (Signed)
PT Cancellation Note  Patient Details Name: Kristine Macias MRN: [id number] DOB: 06-21-1986   Cancelled Treatment:     PT attempt. Pt politely refused. Acute PT will continue to follow per POC and return at later time/date when pt is more agreeable.    Willette Pa 07/07/2020, 11:08 AM

## 2020-07-07 NOTE — TOC Progression Note (Signed)
Transition of Care [facility]) - Progression Note    Patient Details  Name: Makenzi Bannister MRN: [id number] Date of Birth: [birth year]  Transition of Care [facility]) CM/SW [place], LCSW Phone Number: 07/07/2020, 2:32 PM  Clinical Narrative:   CSW consulted to refer pt to [facility]. CSW called [facility] and they are a walk in based clinic open for walk ins 9AM-4PM M-F. RN notified.     Expected Discharge Plan: Home/Self Care Barriers to Discharge: Continued Medical Work up  Expected Discharge Plan and Services Expected Discharge Plan: Home/Self Care       Living arrangements for the past 2 months: Single Family Home Expected Discharge Date: 07/07/20                                     Social Determinants of Health (SDOH) Interventions    Readmission Risk Interventions Readmission Risk Prevention Plan 07/06/2020  Transportation Screening Complete  HRI or [place] (No Data)  Palliative Care Screening Not Applicable  Medication Review (RN Care Manager) Complete  Some recent data might be hidden

## 2020-07-07 NOTE — Progress Notes (Signed)
Kristine Macias to be D/C'd home per MD order.  Discussed prescriptions and follow up appointments with the patient. Prescriptions given to patient, medication list explained in detail. Pt verbalized understanding.  Allergies as of 07/07/2020       Reactions   Aspirin Shortness Of Breath   Effexor [venlafaxine] Anaphylaxis   Gabapentin Anaphylaxis   Libritabs [chlordiazepoxide] Anaphylaxis   Tolerates lorazepam and clonazepam   Quetiapine Hives, Itching, Rash   Sulfa Antibiotics Anaphylaxis   Tetracyclines & Related Anaphylaxis   Trazodone And Nefazodone Anaphylaxis   Latex Hives, Itching   Paxil [paroxetine] Hives, Itching   Seroquel [quetiapine Fumarate] Hives, Itching   Zoloft [sertraline Hcl] Hives, Itching        Medication List     STOP taking these medications    acetaminophen 325 MG tablet Commonly known as: TYLENOL   traMADol 50 MG tablet Commonly known as: ULTRAM       TAKE these medications    albuterol 108 (90 Base) MCG/ACT inhaler Commonly known as: VENTOLIN HFA Inhale 2 puffs into the lungs every 6 (six) hours as needed for wheezing or shortness of breath.   amoxicillin-clavulanate 875-125 MG tablet Commonly known as: Augmentin Take 1 tablet by mouth 2 (two) times daily for 3 days.   cyanocobalamin 1000 MCG tablet Take 1 tablet (1,000 mcg total) by mouth daily. Start taking on: July 08, 1656   folic acid 1 MG tablet Commonly known as: FOLVITE Take 1 tablet (1 mg total) by mouth daily. Start taking on: July 08, 2020   LORazepam 1 MG tablet Commonly known as: Ativan Day 1: 2 mg (2 tablets) every 8 hours. Day 2: 2 mg (2 tablets) every 8 hours. Day 3: 1 mg (1 tablet) every 8 hours. Day 4: 1 mg (1 tablet) every 12 hours) Day 5: 1 mg (1 tablet) at bedtime.   melatonin 5 MG Tabs Take 5 mg by mouth at bedtime as needed (sleep).   multivitamin with minerals Tabs tablet Take 1 tablet by mouth daily. Start taking on: July 08, 2020    omeprazole 40 MG capsule Commonly known as: PRILOSEC Take 1 capsule (40 mg total) by mouth daily.   ondansetron 4 MG tablet Commonly known as: ZOFRAN Take 1 tablet (4 mg total) by mouth every 6 (six) hours as needed for nausea.   thiamine 100 MG tablet Take 1 tablet (100 mg total) by mouth daily. Start taking on: July 10, 2020   traZODone 100 MG tablet Commonly known as: DESYREL Take 1 tablet (100 mg total) by mouth at bedtime as needed for sleep (may take 58m or 1078m.        Vitals:   07/07/20 0800 07/07/20 1225  BP: 124/87 (!) 137/97  Pulse: 75 87  Resp: 18 20  Temp: 98.5 F (36.9 C) 98.6 F (37 C)  SpO2: 97% 98%    Skin clean, dry and intact without evidence of skin break down, no evidence of skin tears noted. IV catheter discontinued intact. Site without signs and symptoms of complications. Dressing and pressure applied. Pt denies pain at this time. No complaints noted.  An After Visit Summary was printed and given to the patient. Patient escorted via [place] D/C home via private auto.  [place] Joon Pohle

## 2020-07-07 NOTE — Consult Note (Signed)
[place] [facility] Consult   Reason for Consult: Consult for patient with a history of alcohol abuse and mental health problems who came into the hospital with GI bleed Referring Physician:  Pahwani Patient Identification: Kristine Macias MRN:  [id number] Principal Diagnosis: Alcohol dependence with withdrawal with complication [facility]) Diagnosis:  Principal Problem:   Alcohol dependence with withdrawal with complication ([place]) Active Problems:   Abdominal pain   Substance induced mood disorder ([place])   Alcohol withdrawal ([place])   GI bleeding   GI bleed   History of anorexia nervosa   Total Time spent with patient: 1 hour  Subjective:   Kristine Macias is a 34 y.o. female patient admitted with "I am mainly feeling nervous".  HPI: Patient seen chart reviewed.  34 year old woman known from prior encounters as well.  She has a history of longstanding struggles with alcohol abuse as well as problems in the past with anorexia and mood instability.  Patient came into the hospital this time with GI bleeding.  It looks like she had to be treated medically for alcohol withdrawal for several days.  She says and it is documented that when she first came into the hospital she was having visual hallucinations.  She is no longer having visual hallucinations and is sleeping better.  No current psychotic symptoms.  Mostly feeling nervous about going home.  Denies feeling suicidal or specifically depressed.  Patient tells me that for him pretty much all of 2021 she has been drinking every night.  Usually she will wait until after midnight and then drink a large amount until she finally falls asleep.  She denies that she has been using any other drugs recently.  She has not recently been getting any outpatient treatment having fallen out of treatment with [address] where she used to go in the past.  Past Psychiatric History: Patient has been seen many times before by the psychiatric service although  it is been a couple years.  She has had alcohol withdrawal seizures and complicated withdrawal in the past.  She also has a past history of misuse of of some prescription medicines.  On the other hand there does seem to be a history at times of her having been able to manage taking clonazepam at home without overdoing it.  No history of suicide attempts.  She has a past history of anorexia which had been life-threatening in the past.  She indicates that that has been more under control but she still has a lot of concerns about eating patterns  Risk to Self: Is patient at risk for suicide?: No Risk to Others:   Prior Inpatient Therapy:   Prior Outpatient Therapy:    Past Medical History:  Past Medical History:  Diagnosis Date  . Alcohol abuse 11/2014   drinking since age 66. chronic, recurrent. at least 2 detox admits before 2015.   Marland Kitchen Alcoholic hepatitis 11/979   hepatic steatosis on 11/2014 ultrasound.   . Breast cancer [facility])    age 33, lump removed from left breast  . Chlamydia 2007   bacterial vaginosis 09/2011  . Colitis 12/2014   colonoscopy for diarrhea and abnml CT 01/06/15: erythmatous TI (path: active ileitis, ? emerging IBD), rectal erythema (path: mucosal prolapse). Random bx of normal colon (path unremarkable)   . Congenital deafness    left ear only.  Also noted in mother and sibling (unilateral).   . Crohn's disease ([place])   . Depression with anxiety initally at age 75  .  Failure to thrive in adult 12/2014.    Malnutrition: n/v, not eating, weight loss, BMI 14.  s/p 01/11/2015 PEG (Dr Dorna Leitz).   . Hypertensive urgency   . Irregular heart beat 2010   wt/diet related after evaluation  . Pancreas divisum 02/2015   Type 1 seen on CT.   Marland Kitchen Pneumothorax, spontaneous, tension   . Renal disorder   . Seizure ([place]) 06/2015   due to ETOH/benzo withdrawal.   . Spontaneous pneumothorax 08/2012   right.  chest tube placed.   . Thrombocytopenia ([place]) 04/2007    Past Surgical History:   Procedure Laterality Date  . CESAREAN SECTION  07/2009    x 1.  G3, Para 2012.   . CHEST TUBE INSERTION    . COLONOSCOPY WITH PROPOFOL N/A 01/06/2015   [facility], Dr Rayann Heman. colonoscopy for diarrhea and abnml CT 01/06/15: erythmatous TI (path: active ileitis, ? emerging IBD), rectal erythema (path: mucosal prolapse). Random bx of normal colon (path unremarkable)   . ERCP N/A 09/28/2019   Procedure: ENDOSCOPIC RETROGRADE CHOLANGIOPANCREATOGRAPHY (ERCP);  Surgeon: Lucilla Lame, MD;  Location: [facility] ENDOSCOPY;  Service: Endoscopy;  Laterality: N/A;  . ESOPHAGOGASTRODUODENOSCOPY N/A 01/06/2015   ;[facility], Dr Rayann Heman. For wt loss, N/V: Normal study, duodenal biopsy/pathology: chronic active duodenitis.   Marland Kitchen ESOPHAGOGASTRODUODENOSCOPY N/A 01/11/2015   Rein-normal with PEG placement  . PEG PLACEMENT N/A 01/11/2015   [facility], Dr Rayann Heman.  for N/V/wt loss/severe malnutrition.    Family History:  Family History  Problem Relation Age of Onset  . Thyroid disease Mother   . Cancer Mother        breast cancer  . Cancer Father        lung cancer  . Stroke Other   . Crohn's disease Brother   . Cancer Maternal Grandmother        breast cancer  . Anesthesia problems Neg Hx    Family Psychiatric  History: Reports a family history of anxiety and depression and behavior problems Social History:  Social History   Substance and Sexual Activity  Alcohol Use Yes  . Alcohol/week: 36.0 standard drinks  . Types: 36 Cans of beer per week   Comment: Last drink last night- 2 drinks     Social History   Substance and Sexual Activity  Drug Use No    Social History   Socioeconomic History  . Marital status: Divorced    Spouse name: Not on file  . Number of children: Not on file  . Years of education: Not on file  . Highest education level: Not on file  Occupational History  . Not on file  Tobacco Use  . Smoking status: Current Every Day Smoker    Packs/day: 1.00    Years: 13.00    Pack years: 13.00    Types:  E-cigarettes  . Smokeless tobacco: Never Used  Vaping Use  . Vaping Use: Every day  . Substances: Nicotine  Substance and Sexual Activity  . Alcohol use: Yes    Alcohol/week: 36.0 standard drinks    Types: 36 Cans of beer per week    Comment: Last drink last night- 2 drinks  . Drug use: No  . Sexual activity: Yes    Birth control/protection: Other-see comments    Comment: Tubes Tied  Other Topics Concern  . Not on file  Social History Narrative   Lives with mother & 2 children   disabled   Social Determinants of Health   Financial Resource Strain:   . Difficulty  of Paying Living Expenses: Not on file  Food Insecurity:   . Worried About Charity fundraiser in the Last Year: Not on file  . Ran Out of Food in the Last Year: Not on file  Transportation Needs:   . Lack of Transportation (Medical): Not on file  . Lack of Transportation (Non-Medical): Not on file  Physical Activity:   . Days of Exercise per Week: Not on file  . Minutes of Exercise per Session: Not on file  Stress:   . Feeling of Stress : Not on file  Social Connections:   . Frequency of Communication with Friends and Family: Not on file  . Frequency of Social Gatherings with Friends and Family: Not on file  . Attends Religious Services: Not on file  . Active Member of Clubs or Organizations: Not on file  . Attends Archivist Meetings: Not on file  . Marital Status: Not on file   Additional Social History:    Allergies:   Allergies  Allergen Reactions  . Aspirin Shortness Of Breath  . Effexor [Venlafaxine] Anaphylaxis  . Gabapentin Anaphylaxis  . Libritabs [Chlordiazepoxide] Anaphylaxis    Tolerates lorazepam and clonazepam  . Quetiapine Hives, Itching and Rash  . Sulfa Antibiotics Anaphylaxis  . Tetracyclines & Related Anaphylaxis  . Trazodone And Nefazodone Anaphylaxis  . Latex Hives and Itching  . Paxil [Paroxetine] Hives and Itching  . Seroquel [Quetiapine Fumarate] Hives and Itching   . Zoloft [Sertraline Hcl] Hives and Itching    Labs:  Results for orders placed or performed during the hospital encounter of 07/01/20 (from the past 48 hour(s))  Comprehensive metabolic panel     Status: Abnormal   Collection Time: 07/05/20  5:48 PM  Result Value Ref Range   Sodium 134 (L) 135 - 145 mmol/L   Potassium 4.3 3.5 - 5.1 mmol/L   Chloride 104 98 - 111 mmol/L   CO2 21 (L) 22 - 32 mmol/L   Glucose, Bld 117 (H) 70 - 99 mg/dL    Comment: Glucose reference range applies only to samples taken after fasting for at least 8 hours.   BUN <5 (L) 6 - 20 mg/dL   Creatinine, Ser 0.37 (L) 0.44 - 1.00 mg/dL   Calcium 9.0 8.9 - 10.3 mg/dL   Total Protein 7.4 6.5 - 8.1 g/dL   Albumin 3.7 3.5 - 5.0 g/dL   AST 126 (H) 15 - 41 U/L   ALT 67 (H) 0 - 44 U/L   Alkaline Phosphatase 114 38 - 126 U/L   Total Bilirubin 1.1 0.3 - 1.2 mg/dL   GFR, Estimated >60 >60 mL/min    Comment: (NOTE) Calculated using the CKD-EPI Creatinine Equation (2021)    Anion gap 9 5 - 15    Comment: Performed at [facility], [address], [place], [place] [address]  Magnesium     Status: Abnormal   Collection Time: 07/05/20  5:48 PM  Result Value Ref Range   Magnesium 1.6 (L) 1.7 - 2.4 mg/dL    Comment: Performed at [facility], [address] [place], [place] [address]  Phosphorus     Status: None   Collection Time: 07/05/20  5:48 PM  Result Value Ref Range   Phosphorus 3.0 2.5 - 4.6 mg/dL    Comment: Performed at [facility], [address], [place], [place] [address]  CBC     Status: Abnormal   Collection Time: 07/05/20  5:48 PM  Result Value  Ref Range   WBC 7.2 4.0 - 10.5 K/uL   RBC 3.84 (L) 3.87 - 5.11 MIL/uL   Hemoglobin 13.6 12.0 - 15.0 g/dL   HCT 39.7 36 - 46 %   MCV 103.4 (H) 80.0 - 100.0 fL   MCH 35.4 (H) 26.0 - 34.0 pg   MCHC 34.3 30.0 - 36.0 g/dL   RDW 13.1 11.5 - 15.5 %   Platelets 141 (L) 150 - 400 K/uL   nRBC 0.0 0.0 - 0.2 %    Comment: Performed at  [facility], [address] [place], [place] [address]  CBC with Differential/Platelet     Status: Abnormal   Collection Time: 07/06/20  4:15 AM  Result Value Ref Range   WBC 7.0 4.0 - 10.5 K/uL   RBC 3.79 (L) 3.87 - 5.11 MIL/uL   Hemoglobin 13.5 12.0 - 15.0 g/dL   HCT 39.2 36 - 46 %   MCV 103.4 (H) 80.0 - 100.0 fL   MCH 35.6 (H) 26.0 - 34.0 pg   MCHC 34.4 30.0 - 36.0 g/dL   RDW 12.9 11.5 - 15.5 %   Platelets 142 (L) 150 - 400 K/uL   nRBC 0.0 0.0 - 0.2 %   Neutrophils Relative % 56 %   Neutro Abs 3.9 1.7 - 7.7 K/uL   Lymphocytes Relative 31 %   Lymphs Abs 2.2 0.7 - 4.0 K/uL   Monocytes Relative 11 %   Monocytes Absolute 0.7 0.1 - 1.0 K/uL   Eosinophils Relative 2 %   Eosinophils Absolute 0.1 0.0 - 0.5 K/uL   Basophils Relative 0 %   Basophils Absolute 0.0 0.0 - 0.1 K/uL   Immature Granulocytes 0 %   Abs Immature Granulocytes 0.01 0.00 - 0.07 K/uL    Comment: Performed at [facility], [address] [place], [place] [address]  Comprehensive metabolic panel     Status: Abnormal   Collection Time: 07/06/20  4:15 AM  Result Value Ref Range   Sodium 137 135 - 145 mmol/L   Potassium 4.0 3.5 - 5.1 mmol/L   Chloride 105 98 - 111 mmol/L   CO2 24 22 - 32 mmol/L   Glucose, Bld 98 70 - 99 mg/dL    Comment: Glucose reference range applies only to samples taken after fasting for at least 8 hours.   BUN <5 (L) 6 - 20 mg/dL   Creatinine, Ser 0.37 (L) 0.44 - 1.00 mg/dL   Calcium 8.8 (L) 8.9 - 10.3 mg/dL   Total Protein 7.1 6.5 - 8.1 g/dL   Albumin 3.6 3.5 - 5.0 g/dL   AST 97 (H) 15 - 41 U/L   ALT 60 (H) 0 - 44 U/L   Alkaline Phosphatase 106 38 - 126 U/L   Total Bilirubin 1.0 0.3 - 1.2 mg/dL   GFR, Estimated >60 >60 mL/min    Comment: (NOTE) Calculated using the CKD-EPI Creatinine Equation (2021)    Anion gap 8 5 - 15    Comment: Performed at [facility], [address] [place], [place] [address]  Magnesium     Status: None   Collection Time:  07/06/20  4:15 AM  Result Value Ref Range   Magnesium 1.8 1.7 - 2.4 mg/dL    Comment: Performed at [facility], [address], [place], [place] [address]  Phosphorus     Status: None   Collection Time: 07/06/20  4:15 AM  Result Value Ref Range   Phosphorus 4.1 2.5 - 4.6  mg/dL    Comment: Performed at [facility], [address] [place], [place] [address]  Comprehensive metabolic panel     Status: Abnormal   Collection Time: 07/07/20  4:09 AM  Result Value Ref Range   Sodium 138 135 - 145 mmol/L   Potassium 3.8 3.5 - 5.1 mmol/L   Chloride 105 98 - 111 mmol/L   CO2 24 22 - 32 mmol/L   Glucose, Bld 96 70 - 99 mg/dL    Comment: Glucose reference range applies only to samples taken after fasting for at least 8 hours.   BUN <5 (L) 6 - 20 mg/dL   Creatinine, Ser 0.41 (L) 0.44 - 1.00 mg/dL   Calcium 9.3 8.9 - 10.3 mg/dL   Total Protein 7.0 6.5 - 8.1 g/dL   Albumin 3.5 3.5 - 5.0 g/dL   AST 101 (H) 15 - 41 U/L   ALT 67 (H) 0 - 44 U/L   Alkaline Phosphatase 101 38 - 126 U/L   Total Bilirubin 0.8 0.3 - 1.2 mg/dL   GFR, Estimated >60 >60 mL/min    Comment: (NOTE) Calculated using the CKD-EPI Creatinine Equation (2021)    Anion gap 9 5 - 15    Comment: Performed at [facility], [address], [place], [place] [address]    Current Facility-Administered Medications  Medication Dose Route Frequency Provider Last Rate Last Admin  . 0.9 % NaCl with KCl 20 mEq/ L  infusion   Intravenous Continuous Mansy, Jan A, MD 100 mL/hr at 07/07/20 0342 New Bag at 07/07/20 0342  . albuterol (VENTOLIN HFA) 108 (90 Base) MCG/ACT inhaler 2 puff  2 puff Inhalation Q6H PRN Mansy, Jan A, MD      . Ampicillin-Sulbactam (UNASYN) 3 g in sodium chloride 0.9 % 100 mL IVPB  3 g Intravenous Q6H Duncan, Asajah R, RPH 200 mL/hr at 07/07/20 0612 3 g at 07/07/20 0612  . feeding supplement (ENSURE ENLIVE / ENSURE PLUS) liquid 237 mL  237 mL Oral BID BM Elodia [place]., MD   237 mL at  07/07/20 0936  . folic acid (FOLVITE) tablet 1 mg  1 mg Oral Daily Mansy, Jan A, MD   1 mg at 07/07/20 0930  . LORazepam (ATIVAN) tablet 1-4 mg  1-4 mg Oral Q1H PRN Elodia [place]., MD   1 mg at 07/07/20 1000   Or  . LORazepam (ATIVAN) injection 1-4 mg  1-4 mg Intravenous Q1H PRN Elodia [place]., MD   1 mg at 07/07/20 0339  . magnesium hydroxide (MILK OF MAGNESIA) suspension 30 mL  30 mL Oral Daily PRN Mansy, Jan A, MD      . melatonin tablet 5 mg  5 mg Oral QHS PRN Mansy, Jan A, MD   5 mg at 07/06/20 2031  . multivitamin with minerals tablet 1 tablet  1 tablet Oral Daily Mansy, Jan A, MD   1 tablet at 07/07/20 0930  . ondansetron (ZOFRAN) tablet 4 mg  4 mg Oral Q6H PRN Mansy, Jan A, MD   4 mg at 07/06/20 1609   Or  . ondansetron (ZOFRAN) injection 4 mg  4 mg Intravenous Q6H PRN Mansy, Jan A, MD   4 mg at 07/07/20 1000  . pantoprazole (PROTONIX) EC tablet 40 mg  40 mg Oral BID Elodia [place]., MD   40 mg at 07/07/20 0930  . thiamine (B-1) 250 mg in sodium chloride 0.9 % 50 mL IVPB  250  mg Intravenous Daily Elodia [place]., MD 100 mL/hr at 07/07/20 0936 250 mg at 07/07/20 0936   Followed by  . [START ON 07/10/2020] thiamine tablet 100 mg  100 mg Oral Daily Elodia [place]., MD      . traMADol Veatrice Bourbon) tablet 50 mg  50 mg Oral TID PRN Mansy, Jan A, MD      . traZODone (DESYREL) tablet 100 mg  100 mg Oral QHS PRN Shantanique Hodo, Madie Reno, MD      . vitamin B-12 (CYANOCOBALAMIN) tablet 1,000 mcg  1,000 mcg Oral Daily Elodia [place]., MD   1,000 mcg at 07/07/20 0930    Musculoskeletal: Strength & Muscle Tone: within normal limits Gait & Station: normal Patient leans: N/A  Psychiatric Specialty Exam: Physical Exam Vitals and nursing note reviewed.  HENT:     Head: Normocephalic and atraumatic.  Eyes:     Conjunctiva/sclera: Conjunctivae normal.     Pupils: Pupils are equal, round, and reactive to light.  Cardiovascular:     Heart sounds: Normal heart  sounds.  Pulmonary:     Effort: Pulmonary effort is normal.  Abdominal:     Palpations: Abdomen is soft.  Musculoskeletal:        General: Normal range of motion.     Cervical back: Normal range of motion.  Skin:    General: Skin is warm and dry.  Neurological:     General: No focal deficit present.     Mental Status: She is alert.  Psychiatric:        Attention and Perception: Attention normal.        Mood and Affect: Mood is anxious and depressed.        Speech: Speech normal.        Behavior: Behavior is not agitated.        Thought Content: Thought content is not paranoid. Thought content does not include homicidal or suicidal ideation.        Cognition and Memory: Cognition normal.        Judgment: Judgment normal.     Review of Systems  Constitutional: Negative.   HENT: Negative.   Eyes: Negative.   Respiratory: Negative.   Cardiovascular: Negative.   Gastrointestinal: Negative.   Musculoskeletal: Negative.   Skin: Negative.   Neurological: Negative.   Psychiatric/Behavioral: Positive for behavioral problems and dysphoric mood. Negative for suicidal ideas. The patient is nervous/anxious.     Blood pressure 124/87, pulse 75, temperature 98.5 F (36.9 C), temperature source Oral, resp. rate 18, height 5' 6"  (1.676 m), weight 68.9 kg, SpO2 97 %.Body mass index is 24.53 kg/m.  General Appearance: Casual  Eye Contact:  Fair  Speech:  Slow  Volume:  Decreased  Mood:  Anxious and Dysphoric  Affect:  Constricted  Thought Process:  Coherent  Orientation:  Full (Time, Place, and Person)  Thought Content:  Logical  Suicidal Thoughts:  No  Homicidal Thoughts:  No  Memory:  Immediate;   Fair Recent;   Fair Remote;   Fair  Judgement:  Fair  Insight:  Fair  Psychomotor Activity:  Decreased  Concentration:  Concentration: Fair  Recall:  [employer] of Knowledge:  Fair  Language:  Fair  Akathisia:  No  Handed:  Right  AIMS (if indicated):     Assets:  Desire for  Improvement Housing Resilience Social Support  ADL's:  Intact  Cognition:  WNL  Sleep:        Treatment Plan  Summary: Daily contact with patient to assess and evaluate symptoms and progress in treatment, Medication management and Plan Patient is pended for discharge medically today.  She is very worried that she is still going to have withdrawal symptoms at home.  I agreed to print out a withdrawal taper of Ativan for a few days for her.  Counseled her as she has been counseled in the past that she cannot use alcohol while taking benzodiazepines.  We also discussed how if she stops drinking she will be probably in need of something to help her sleep for the time being.  I reviewed the allergy in her chart to trazodone.  Patient says she did not actually ever have an anaphylactic reaction but just at times felt oversedated by it.  She is agreeable to restarting trazodone at 50 to 100 mg at night for sleep as needed.  She does not want to start any antidepressant at this time.  She is amenable however to referral back to outpatient treatment.  In the past she has done intensive outpatient treatment and therapy and medicine management through [facility] which is where I would recommend she be referred again.  Given her past history of anorexia her worry about having specific dietary restrictions because of her GI problems is more understandable.  Seeing a therapist regularly should be of crucial help with that.  Supportive counseling completed.  No need for inpatient hospitalization or commitment.  Prescriptions printed out and placed on the chart.  Disposition: No evidence of imminent risk to self or others at present.   Patient does not meet criteria for psychiatric inpatient admission. Supportive therapy provided about ongoing stressors. Refer to IOP. Discussed crisis plan, support from social network, calling 911, coming to the Emergency Department, and calling Suicide Hotline.  Alethia Berthold,  MD 07/07/2020 11:36 AM

## 2020-07-07 NOTE — Discharge Summary (Signed)
Physician Discharge Summary  Kristine Macias QQV:[id number] DOB: [birth year] DOA: 07/01/2020  PCP: Kristine Macias  Admit date: 07/01/2020 Discharge date: 07/07/2020  Admitted From: home Disposition:  Home  Recommendations for Outpatient Follow-up:  1. Follow-up with PCP in 1 week 2. Follow-up with GI in 1 to 2 weeks 3. Repeat CBC and CMP on follow-up visit 4. Take Augmentin 875 mg p.o. twice daily for 3 more days. 5. Follow-up with psychiatrist outpatient 6. Take trazodone as instructed by psychiatry.  Home Health: none  Equipment/Devices: None Discharge Condition: Stable CODE STATUS: Full code Diet recommendation: Regular diet  Brief/Interim Summary: SandraJohnstonis a33 y.o.Caucasian femalewith a known history of multiple medical problems that are mentioned below including alcohol abuse, alcoholic liver cirrhosis, Crohn's disease, depression and anxiety alcohol withdrawal seizures in hypertension, who presented to the emergency room with acute onset of coffee-ground emesis with associated nausea as well as diarrhea with loose bowel movements.She had a CT scan with findings c/w colitis. She was admitted for concern for GI bleed, colitis, as well as etoh withdrawal. She's been seen by GI who recommends outpatient follow up given her stable Hb/Hct and no reported GI bleeding since admission.   Alcohol withdrawal: Patient started on CIWA protocol, multivitamin, folic acid and thiamine.  Patient symptoms improved significantly.  She appears very pleasant, alert and oriented on day of discharge.  Acute Metabolic Encephalopathy  Hallucinations: 2/2 etoh withdrawal.  -ammonia (wnl), b12 (low normal), folate (wnl). -Placed on delirium precautions.   -Her symptoms improved.    Acute colitis  hx Crohn's disease -CT with wall thickening in R and transverse colon concerning for infectious/inflammatory colitis  -negative GI path panel and c diff -She was started on IV  Zosyn on admission which was changed to Unasyn.  She received 5 days course of IV antibiotics while hospitalized.  Plan for to continue 7 days course of antibiotics. -GI c/s, appreciate recs - [facility], needs outpatient GI follow up and needs colonoscopy -Patient will be discharged on Augmentin 875 mg twice daily for 3 more days.  GI bleeding. -reported coffee ground emesis and melena.  No further episodes of GI bleeding during hospitalization. -Her H&H remained stable during hospitalization.  Continued PPI. -GI consulted-recommended outpatient follow-up and possible colonoscopy.  GI signed off.  Macrocytosis/low Normal B12: Continue B12 supplement.  Elevated LFT's: likely 2/2 etoh use - Acute hepatitis panel negative.   -Improving.  Total bilirubin: WNL.  Follow-up with GI outpatient.  Hypokalemia  Hypophosphatemia -Replaced  Thrombocytopenia:  -Likely in the setting of ethanol abuse.  Platelet: 142.    Depression: -Consulted psych as [facility] patient's request-patient was seen by psychiatrist on 11/11 and recommended trazodone 50-100 as needed for sleep at bedtime and outpatient follow-up.  Appreciate psychiatry help.  Discharge Diagnoses:  Alcohol withdrawal Acute metabolic encephalopathy/hallucinations secondary to ethanol withdrawal Acute colitis/history of Crohn's disease GI bleeding Macrocytosis/no B12 level Elevated liver enzymes Hypokalemia Hypophosphatemia Thrombocytopenia Depression  Discharge Instructions  Discharge Instructions    Diet general   Complete by: As directed    Discharge instructions   Complete by: As directed    Follow-up with PCP in 1 week Follow-up with GI in 1 to 2 weeks Repeat CBC and CMP on follow-up visit Take Augmentin 875 mg p.o. twice daily for 3 more days. Follow-up with psychiatrist outpatient Take trazodone as instructed by psychiatry.   Increase activity slowly   Complete by: As directed      Allergies as of 07/07/2020  Reactions   Aspirin Shortness Of Breath   Effexor [venlafaxine] Anaphylaxis   Gabapentin Anaphylaxis   Libritabs [chlordiazepoxide] Anaphylaxis   Tolerates lorazepam and clonazepam   Quetiapine Hives, Itching, Rash   Sulfa Antibiotics Anaphylaxis   Tetracyclines & Related Anaphylaxis   Trazodone And Nefazodone Anaphylaxis   Latex Hives, Itching   Paxil [paroxetine] Hives, Itching   Seroquel [quetiapine Fumarate] Hives, Itching   Zoloft [sertraline Hcl] Hives, Itching      Medication List    STOP taking these medications   acetaminophen 325 MG tablet Commonly known as: TYLENOL   traMADol 50 MG tablet Commonly known as: ULTRAM     TAKE these medications   albuterol 108 (90 Base) MCG/ACT inhaler Commonly known as: VENTOLIN HFA Inhale 2 puffs into the lungs every 6 (six) hours as needed for wheezing or shortness of breath.   amoxicillin-clavulanate 875-125 MG tablet Commonly known as: Augmentin Take 1 tablet by mouth 2 (two) times daily for 3 days.   cyanocobalamin 1000 MCG tablet Take 1 tablet (1,000 mcg total) by mouth daily. Start taking on: July 08, 7781   folic acid 1 MG tablet Commonly known as: FOLVITE Take 1 tablet (1 mg total) by mouth daily. Start taking on: July 08, 2020   LORazepam 1 MG tablet Commonly known as: Ativan Day 1: 2 mg (2 tablets) every 8 hours. Day 2: 2 mg (2 tablets) every 8 hours. Day 3: 1 mg (1 tablet) every 8 hours. Day 4: 1 mg (1 tablet) every 12 hours) Day 5: 1 mg (1 tablet) at bedtime.   melatonin 5 MG Tabs Take 5 mg by mouth at bedtime as needed (sleep).   multivitamin with minerals Tabs tablet Take 1 tablet by mouth daily. Start taking on: July 08, 2020   omeprazole 40 MG capsule Commonly known as: PRILOSEC Take 1 capsule (40 mg total) by mouth daily.   ondansetron 4 MG tablet Commonly known as: ZOFRAN Take 1 tablet (4 mg total) by mouth every 6 (six) hours as needed for nausea.   thiamine 100 MG tablet Take 1  tablet (100 mg total) by mouth daily. Start taking on: July 10, 2020   traZODone 100 MG tablet Commonly known as: DESYREL Take 1 tablet (100 mg total) by mouth at bedtime as needed for sleep (may take 22m or 1040m.       Allergies  Allergen Reactions  . Aspirin Shortness Of Breath  . Effexor [Venlafaxine] Anaphylaxis  . Gabapentin Anaphylaxis  . Libritabs [Chlordiazepoxide] Anaphylaxis    Tolerates lorazepam and clonazepam  . Quetiapine Hives, Itching and Rash  . Sulfa Antibiotics Anaphylaxis  . Tetracyclines & Related Anaphylaxis  . Trazodone And Nefazodone Anaphylaxis  . Latex Hives and Itching  . Paxil [Paroxetine] Hives and Itching  . Seroquel [Quetiapine Fumarate] Hives and Itching  . Zoloft [Sertraline Hcl] Hives and Itching    Consultations:  GI  Psychiatry   Procedures/Studies: DG Chest Port 1 View  Result Date: 07/02/2020 CLINICAL DATA:  Vomiting, abdominal pain EXAM: PORTABLE CHEST 1 VIEW COMPARISON:  02/10/2018 chest radiograph. FINDINGS: Stable cardiomediastinal silhouette with normal heart size. No pneumothorax. No pleural effusion. Lungs appear clear, with no acute consolidative airspace disease and no pulmonary edema. IMPRESSION: No active disease. Electronically Signed   By: JaIlona Sorrel.D.   On: 07/02/2020 09:14   CT Angio Abd/Pel W and/or Wo Contrast  Result Date: 07/02/2020 CLINICAL DATA:  Left upper quadrant pain EXAM: CTA ABDOMEN AND PELVIS WITHOUT AND WITH CONTRAST  TECHNIQUE: Multidetector CT imaging of the abdomen and pelvis was performed using the standard protocol during bolus administration of intravenous contrast. Multiplanar reconstructed images and MIPs were obtained and reviewed to evaluate the vascular anatomy. CONTRAST:  143m OMNIPAQUE IOHEXOL 350 MG/ML SOLN COMPARISON:  09/26/2019 FINDINGS: VASCULAR Aorta: Normal caliber aorta without aneurysm, dissection, vasculitis or significant stenosis. Celiac: Patent without evidence of  aneurysm, dissection, vasculitis or significant stenosis. SMA: Patent without evidence of aneurysm, dissection, vasculitis or significant stenosis. Renals: Both renal arteries are patent without evidence of aneurysm, dissection, vasculitis, fibromuscular dysplasia or significant stenosis. IMA: Patent without evidence of aneurysm, dissection, vasculitis or significant stenosis. Inflow: Patent without evidence of aneurysm, dissection, vasculitis or significant stenosis. Proximal Outflow: Bilateral common femoral and visualized portions of the superficial and profunda femoral arteries are patent without evidence of aneurysm, dissection, vasculitis or significant stenosis. Veins: No obvious venous abnormality within the limitations of this arterial phase study. Review of the MIP images confirms the above findings. NON-VASCULAR Lower chest: Negative. Hepatobiliary: Severe diffuse fatty infiltration of the liver. No focal hepatic abnormality. Gallbladder unremarkable. Pancreas: No focal abnormality or ductal dilatation. Spleen: No focal abnormality.  Normal size. Adrenals/Urinary Tract: No adrenal abnormality. No focal renal abnormality. No stones or hydronephrosis. Urinary bladder is unremarkable. Stomach/Bowel: There is colonic wall thickening noted involving the cecum, ascending colon and transverse colon concerning for colitis. Stomach and small bowel decompressed. No bowel obstruction. Lymphatic: No adenopathy Reproductive: Uterus and adnexa unremarkable.  No mass. Other: No free fluid or free air. Musculoskeletal: No acute bony abnormality. IMPRESSION: VASCULAR No acute or significant vascular abnormality. NON-VASCULAR Severe diffuse fatty infiltration of the liver. Wall thickening in the right colon and transverse colon concerning for infectious or inflammatory colitis. Electronically Signed   By: KRolm BaptiseM.D.   On: 07/02/2020 01:11      Subjective: Patient seen and examined.  Sitting comfortably on the  bed.  Tells me that she feels better, denies abdominal pain, nausea, melena, hematemesis, decreased appetite.  Wishes to go home today.  discharge Exam: Vitals:   07/07/20 0800 07/07/20 1225  BP: 124/87 (!) 137/97  Pulse: 75 87  Resp: 18 20  Temp: 98.5 F (36.9 C) 98.6 F (37 C)  SpO2: 97% 98%   Vitals:   07/07/20 0009 07/07/20 0421 07/07/20 0800 07/07/20 1225  BP: (!) 146/97 117/80 124/87 (!) 137/97  Pulse: 86 67 75 87  Resp:  16 18 20   Temp: (!) 97.5 F (36.4 C) 98.2 F (36.8 C) 98.5 F (36.9 C) 98.6 F (37 C)  TempSrc: Oral Oral Oral Oral  SpO2: 98% 98% 97% 98%  Weight:      Height:        General: Pt is alert, awake, not in acute distress Cardiovascular: RRR, S1/S2 +, no rubs, no gallops Respiratory: CTA bilaterally, no wheezing, no rhonchi Abdominal: Soft, NT, ND, bowel sounds + Extremities: no edema, no cyanosis    The results of significant diagnostics from this hospitalization (including imaging, microbiology, ancillary and laboratory) are listed below for reference.     Microbiology: Recent Results (from the past 240 hour(s))  Respiratory Panel by RT PCR (Flu A&B, Covid) - Nasopharyngeal Swab     Status: None   Collection Time: 07/01/20  6:16 PM   Specimen: Nasopharyngeal Swab  Result Value Ref Range Status   SARS Coronavirus 2 by RT PCR NEGATIVE NEGATIVE Final    Comment: (NOTE) SARS-CoV-2 target nucleic acids are NOT DETECTED.  The SARS-CoV-2 RNA  is generally detectable in upper respiratoy specimens during the acute phase of infection. The lowest concentration of SARS-CoV-2 viral copies this assay can detect is 131 copies/mL. A negative result does not preclude SARS-Cov-2 infection and should not be used as the sole basis for treatment or other patient management decisions. A negative result may occur with  improper specimen collection/handling, submission of specimen other than nasopharyngeal swab, presence of viral mutation(s) within the areas  targeted by this assay, and inadequate number of viral copies (<131 copies/mL). A negative result must be combined with clinical observations, patient history, and epidemiological information. The expected result is Negative.  Fact Sheet for Patients:  PinkCheek.be  Fact Sheet for Healthcare Providers:  GravelBags.it  This test is no t yet approved or cleared by the [place] FDA and  has been authorized for detection and/or diagnosis of SARS-CoV-2 by FDA under an Emergency Use Authorization (EUA). This EUA will remain  in effect (meaning this test can be used) for the duration of the COVID-19 declaration under Section 564(b)(1) of the Act, 21 U.S.C. section 360bbb-3(b)(1), unless the authorization is terminated or revoked sooner.     Influenza A by PCR NEGATIVE NEGATIVE Final   Influenza B by PCR NEGATIVE NEGATIVE Final    Comment: (NOTE) The Xpert Xpress SARS-CoV-2/FLU/RSV assay is intended as an aid in  the diagnosis of influenza from Nasopharyngeal swab specimens and  should not be used as a sole basis for treatment. Nasal washings and  aspirates are unacceptable for Xpert Xpress SARS-CoV-2/FLU/RSV  testing.  Fact Sheet for Patients: PinkCheek.be  Fact Sheet for Healthcare Providers: GravelBags.it  This test is not yet approved or cleared by the [place] FDA and  has been authorized for detection and/or diagnosis of SARS-CoV-2 by  FDA under an Emergency Use Authorization (EUA). This EUA will remain  in effect (meaning this test can be used) for the duration of the  Covid-19 declaration under Section 564(b)(1) of the Act, 21  U.S.C. section 360bbb-3(b)(1), unless the authorization is  terminated or revoked. Performed at [facility], [place], [place] [address]   C Difficile Quick Screen w PCR reflex     Status: None    Collection Time: 07/02/20 10:25 AM   Specimen: STOOL  Result Value Ref Range Status   C Diff antigen NEGATIVE NEGATIVE Final   C Diff toxin NEGATIVE NEGATIVE Final   C Diff interpretation No C. difficile detected.  Final    Comment: Performed at [facility], [address] [place], [place] [address]  Gastrointestinal Panel by PCR , Stool     Status: None   Collection Time: 07/02/20 10:25 AM   Specimen: STOOL  Result Value Ref Range Status   Campylobacter species NOT DETECTED NOT DETECTED Final   Plesimonas shigelloides NOT DETECTED NOT DETECTED Final   Salmonella species NOT DETECTED NOT DETECTED Final   Yersinia enterocolitica NOT DETECTED NOT DETECTED Final   Vibrio species NOT DETECTED NOT DETECTED Final   Vibrio cholerae NOT DETECTED NOT DETECTED Final   Enteroaggregative E coli (EAEC) NOT DETECTED NOT DETECTED Final   Enteropathogenic E coli (EPEC) NOT DETECTED NOT DETECTED Final   Enterotoxigenic E coli (ETEC) NOT DETECTED NOT DETECTED Final   Shiga like toxin producing E coli (STEC) NOT DETECTED NOT DETECTED Final   Shigella/Enteroinvasive E coli (EIEC) NOT DETECTED NOT DETECTED Final   Cryptosporidium NOT DETECTED NOT DETECTED Final   Cyclospora cayetanensis NOT DETECTED NOT DETECTED Final  Entamoeba histolytica NOT DETECTED NOT DETECTED Final   Giardia lamblia NOT DETECTED NOT DETECTED Final   Adenovirus F40/41 NOT DETECTED NOT DETECTED Final   Astrovirus NOT DETECTED NOT DETECTED Final   Norovirus GI/GII NOT DETECTED NOT DETECTED Final   Rotavirus A NOT DETECTED NOT DETECTED Final   Sapovirus (I, II, IV, and V) NOT DETECTED NOT DETECTED Final    Comment: Performed at [facility], [address] [place],  [address]     Labs: BNP (last 3 results) No results for input(s): BNP in the last 8760 hours. Basic Metabolic Panel: Recent Labs  Lab 07/03/20 0417 07/03/20 0417 07/04/20 0520 07/05/20 0443 07/05/20 1748 07/06/20 0415  07/07/20 0409  NA 136   < > 137 138 134* 137 138  K 3.5   < > 3.8 4.2 4.3 4.0 3.8  CL 102   < > 104 106 104 105 105  CO2 23   < > 22 22 21* 24 24  GLUCOSE 94   < > 76 79 117* 98 96  BUN <5*   < > <5* <5* <5* <5* <5*  CREATININE 0.45   < > 0.41* 0.48 0.37* 0.37* 0.41*  CALCIUM 7.3*   < > 7.3* 8.1* 9.0 8.8* 9.3  MG 2.4  --  2.0 1.9 1.6* 1.8  --   PHOS 1.4*  --  2.8 3.3 3.0 4.1  --    < > = values in this interval not displayed.   Liver Function Tests: Recent Labs  Lab 07/04/20 0520 07/05/20 0443 07/05/20 1748 07/06/20 0415 07/07/20 0409  AST 110* 93* 126* 97* 101*  ALT 59* 54* 67* 60* 67*  ALKPHOS 115 106 114 106 101  BILITOT 1.5* 1.0 1.1 1.0 0.8  PROT 6.9 6.9 7.4 7.1 7.0  ALBUMIN 3.7 3.5 3.7 3.6 3.5   Recent Labs  Lab 07/01/20 1831  LIPASE 30   Recent Labs  Lab 07/02/20 1517  AMMONIA 21   CBC: Recent Labs  Lab 07/03/20 0417 07/04/20 0520 07/05/20 0443 07/05/20 1748 07/06/20 0415  WBC 6.2 6.3 7.5 7.2 7.0  NEUTROABS 3.8 3.6 4.7  --  3.9  HGB 13.2 13.0 13.4 13.6 13.5  HCT 38.0 38.3 39.1 39.7 39.2  MCV 102.2* 104.1* 104.3* 103.4* 103.4*  PLT 129* 121* 143* 141* 142*   Cardiac Enzymes: No results for input(s): CKTOTAL, CKMB, CKMBINDEX, TROPONINI in the last 168 hours. BNP: Invalid input(s): POCBNP CBG: No results for input(s): GLUCAP in the last 168 hours. D-Dimer No results for input(s): DDIMER in the last 72 hours. Hgb A1c No results for input(s): HGBA1C in the last 72 hours. Lipid Profile No results for input(s): CHOL, HDL, LDLCALC, TRIG, CHOLHDL, LDLDIRECT in the last 72 hours. Thyroid function studies No results for input(s): TSH, T4TOTAL, T3FREE, THYROIDAB in the last 72 hours.  Invalid input(s): FREET3 Anemia work up No results for input(s): VITAMINB12, FOLATE, FERRITIN, TIBC, IRON, RETICCTPCT in the last 72 hours. Urinalysis    Component Value Date/Time   COLORURINE YELLOW (A) 07/01/2020 1831   APPEARANCEUR HAZY (A) 07/01/2020 1831    APPEARANCEUR Clear 02/15/2016 1456   LABSPEC 1.011 07/01/2020 1831   LABSPEC 1.006 12/20/2014 1018   PHURINE 6.0 07/01/2020 1831   GLUCOSEU 50 (A) 07/01/2020 1831   GLUCOSEU Negative 12/20/2014 1018   HGBUR LARGE (A) 07/01/2020 1831   BILIRUBINUR NEGATIVE 07/01/2020 1831   BILIRUBINUR neg 03/13/2016 1700   BILIRUBINUR Negative 02/15/2016 1456   BILIRUBINUR Negative 12/20/2014 1018  KETONESUR NEGATIVE 07/01/2020 1831   PROTEINUR 100 (A) 07/01/2020 1831   UROBILINOGEN negative 03/13/2016 1700   UROBILINOGEN 0.2 06/13/2012 2306   NITRITE NEGATIVE 07/01/2020 1831   LEUKOCYTESUR NEGATIVE 07/01/2020 1831   LEUKOCYTESUR Negative 12/20/2014 1018   Sepsis Labs Invalid input(s): PROCALCITONIN,  WBC,  LACTICIDVEN Microbiology Recent Results (from the past 240 hour(s))  Respiratory Panel by RT PCR (Flu A&B, Covid) - Nasopharyngeal Swab     Status: None   Collection Time: 07/01/20  6:16 PM   Specimen: Nasopharyngeal Swab  Result Value Ref Range Status   SARS Coronavirus 2 by RT PCR NEGATIVE NEGATIVE Final    Comment: (NOTE) SARS-CoV-2 target nucleic acids are NOT DETECTED.  The SARS-CoV-2 RNA is generally detectable in upper respiratoy specimens during the acute phase of infection. The lowest concentration of SARS-CoV-2 viral copies this assay can detect is 131 copies/mL. A negative result does not preclude SARS-Cov-2 infection and should not be used as the sole basis for treatment or other patient management decisions. A negative result may occur with  improper specimen collection/handling, submission of specimen other than nasopharyngeal swab, presence of viral mutation(s) within the areas targeted by this assay, and inadequate number of viral copies (<131 copies/mL). A negative result must be combined with clinical observations, patient history, and epidemiological information. The expected result is Negative.  Fact Sheet for Patients:   PinkCheek.be  Fact Sheet for Healthcare Providers:  GravelBags.it  This test is no t yet approved or cleared by the [place] FDA and  has been authorized for detection and/or diagnosis of SARS-CoV-2 by FDA under an Emergency Use Authorization (EUA). This EUA will remain  in effect (meaning this test can be used) for the duration of the COVID-19 declaration under Section 564(b)(1) of the Act, 21 U.S.C. section 360bbb-3(b)(1), unless the authorization is terminated or revoked sooner.     Influenza A by PCR NEGATIVE NEGATIVE Final   Influenza B by PCR NEGATIVE NEGATIVE Final    Comment: (NOTE) The Xpert Xpress SARS-CoV-2/FLU/RSV assay is intended as an aid in  the diagnosis of influenza from Nasopharyngeal swab specimens and  should not be used as a sole basis for treatment. Nasal washings and  aspirates are unacceptable for Xpert Xpress SARS-CoV-2/FLU/RSV  testing.  Fact Sheet for Patients: PinkCheek.be  Fact Sheet for Healthcare Providers: GravelBags.it  This test is not yet approved or cleared by the [place] FDA and  has been authorized for detection and/or diagnosis of SARS-CoV-2 by  FDA under an Emergency Use Authorization (EUA). This EUA will remain  in effect (meaning this test can be used) for the duration of the  Covid-19 declaration under Section 564(b)(1) of the Act, 21  U.S.C. section 360bbb-3(b)(1), unless the authorization is  terminated or revoked. Performed at [facility], [place], [place] [address]   C Difficile Quick Screen w PCR reflex     Status: None   Collection Time: 07/02/20 10:25 AM   Specimen: STOOL  Result Value Ref Range Status   C Diff antigen NEGATIVE NEGATIVE Final   C Diff toxin NEGATIVE NEGATIVE Final   C Diff interpretation No C. difficile detected.  Final    Comment: Performed at [facility], [address] [place], [place] [address]  Gastrointestinal Panel by PCR , Stool     Status: None   Collection Time: 07/02/20 10:25 AM   Specimen: STOOL  Result Value Ref Range Status   Campylobacter species NOT DETECTED NOT  DETECTED Final   Plesimonas shigelloides NOT DETECTED NOT DETECTED Final   Salmonella species NOT DETECTED NOT DETECTED Final   Yersinia enterocolitica NOT DETECTED NOT DETECTED Final   Vibrio species NOT DETECTED NOT DETECTED Final   Vibrio cholerae NOT DETECTED NOT DETECTED Final   Enteroaggregative E coli (EAEC) NOT DETECTED NOT DETECTED Final   Enteropathogenic E coli (EPEC) NOT DETECTED NOT DETECTED Final   Enterotoxigenic E coli (ETEC) NOT DETECTED NOT DETECTED Final   Shiga like toxin producing E coli (STEC) NOT DETECTED NOT DETECTED Final   Shigella/Enteroinvasive E coli (EIEC) NOT DETECTED NOT DETECTED Final   Cryptosporidium NOT DETECTED NOT DETECTED Final   Cyclospora cayetanensis NOT DETECTED NOT DETECTED Final   Entamoeba histolytica NOT DETECTED NOT DETECTED Final   Giardia lamblia NOT DETECTED NOT DETECTED Final   Adenovirus F40/41 NOT DETECTED NOT DETECTED Final   Astrovirus NOT DETECTED NOT DETECTED Final   Norovirus GI/GII NOT DETECTED NOT DETECTED Final   Rotavirus A NOT DETECTED NOT DETECTED Final   Sapovirus (I, II, IV, and V) NOT DETECTED NOT DETECTED Final    Comment: Performed at [facility], [address], [place], [place] [address]     Time coordinating discharge: Over 30 minutes  SIGNED:   Mckinley Jewel, MD  [facility] 07/07/2020, 12:36 PM Pager   If 7PM-7AM, please contact night-coverage [email or web address]

## 2020-07-07 NOTE — Plan of Care (Signed)
Nutrition Education Note  RD provided diet education today regarding Crohn's disease   Met with pt in room today. Discussed with patient the important role that diet plays in Crohn's disease. Encouraged patient to keep a daily food journal documenting intake of all foods. Recommend looking for patterns of foods that patient is unable to tolerate. Discouraged limiting certain foods or food groups in fear of having diarrhea as patient can become very malnourished this way. Some patients may tolerate food items that other patients may not. Discussed how some food groups may exacerbate symptoms including high FODMAP foods as well as foods high in fat, fiber, caffeine, alcohol, processed sugars and spices. Recommended low fat dairy and meats. Discussed possibility of lactose intolerance; some patients are able to tolerate lactose while others are not. Recommended frequent small meals that are low in fat and fiber. Recommend cooking and chewing foods thoroughly to aid with digestion. Discussed adequate protein intake as well as the use of supplements when intake is poor. Discussed probiotics and their possible benefits. Recommended daily chewable or liquid multivitamin as well as a calcium with vitamin D supplement if unable to tolerate dairy.   Expect good compliance.  Body mass index is 24.24 kg/m. Pt meets criteria for normal weight based on current BMI.  RD following this pt. Pt reports improved appetite and oral intake; pt documented to be eating 100% of meals. Pt is unable to tolerate Ensure and is requesting Boost Breeze; RD will order.   Koleen Distance MS, RD, LDN Please refer to [facility] for RD and/or RD on-call/weekend/after hours pager

## 2020-07-08 LAB — METHYLMALONIC ACID, SERUM: Methylmalonic Acid, Quantitative: 93 nmol/L (ref 0–378)

## 2020-08-24 ENCOUNTER — Ambulatory Visit: Payer: Self-pay | Admitting: Gastroenterology

## 2020-08-24 NOTE — Progress Notes (Deleted)
Primary Care Physician: Patient, No Pcp Per  Primary Gastroenterologist:  Dr. Lucilla Lame  No chief complaint on file.   HPI: Kristine Macias is a 34 y.o. female here for follow-up.  The patient has a history of alcoholic cirrhosis with a nodular liver seen on imaging.  The patient was seen by me back in May and at that time had just been discharged from the hospital for alcoholic hepatitis. The patient was recently in the hospital again in November for alcoholic hepatitis and seen by Dr. Bonna Gains.  She is now here to follow-up after discharge.  Past Medical History:  Diagnosis Date  . Alcohol abuse 11/2014   drinking since age 14. chronic, recurrent. at least 2 detox admits before 2015.   Marland Kitchen Alcoholic hepatitis 11/345   hepatic steatosis on 11/2014 ultrasound.   . Breast cancer [facility])    age 38, lump removed from left breast  . Chlamydia 2007   bacterial vaginosis 09/2011  . Colitis 12/2014   colonoscopy for diarrhea and abnml CT 01/06/15: erythmatous TI (path: active ileitis, ? emerging IBD), rectal erythema (path: mucosal prolapse). Random bx of normal colon (path unremarkable)   . Congenital deafness    left ear only.  Also noted in mother and sibling (unilateral).   . Crohn's disease ([place])   . Depression with anxiety initally at age 66  . Failure to thrive in adult 12/2014.    Malnutrition: n/v, not eating, weight loss, BMI 14.  s/p 01/11/2015 PEG (Dr Dorna Leitz).   . Hypertensive urgency   . Irregular heart beat 2010   wt/diet related after evaluation  . Pancreas divisum 02/2015   Type 1 seen on CT.   Marland Kitchen Pneumothorax, spontaneous, tension   . Renal disorder   . Seizure ([place]) 06/2015   due to ETOH/benzo withdrawal.   . Spontaneous pneumothorax 08/2012   right.  chest tube placed.   . Thrombocytopenia ([place]) 04/2007    Current Outpatient Medications  Medication Sig Dispense Refill  . albuterol (PROVENTIL HFA;VENTOLIN HFA) 108 (90 Base) MCG/ACT inhaler Inhale 2 puffs into the  lungs every 6 (six) hours as needed for wheezing or shortness of breath. 1 Inhaler 2  . folic acid (FOLVITE) 1 MG tablet Take 1 tablet (1 mg total) by mouth daily. 30 tablet 0  . LORazepam (ATIVAN) 1 MG tablet Day 1: 2 mg (2 tablets) every 8 hours. Day 2: 2 mg (2 tablets) every 8 hours. Day 3: 1 mg (1 tablet) every 8 hours. Day 4: 1 mg (1 tablet) every 12 hours) Day 5: 1 mg (1 tablet) at bedtime. 18 tablet 0  . Melatonin 5 MG TABS Take 5 mg by mouth at bedtime as needed (sleep).    . Multiple Vitamin (MULTIVITAMIN WITH MINERALS) TABS tablet Take 1 tablet by mouth daily. 30 tablet 0  . omeprazole (PRILOSEC) 40 MG capsule Take 1 capsule (40 mg total) by mouth daily. 30 capsule 0  . ondansetron (ZOFRAN) 4 MG tablet Take 1 tablet (4 mg total) by mouth every 6 (six) hours as needed for nausea. 20 tablet 0  . thiamine 100 MG tablet Take 1 tablet (100 mg total) by mouth daily. 30 tablet 0  . traZODone (DESYREL) 100 MG tablet Take 1 tablet (100 mg total) by mouth at bedtime as needed for sleep (may take 36m or 1067m. 30 tablet 1  . vitamin B-12 1000 MCG tablet Take 1 tablet (1,000 mcg total) by mouth daily. 30 tablet 0  No current facility-administered medications for this visit.    Allergies as of 08/24/2020 - Review Complete 07/02/2020  Allergen Reaction Noted  . Aspirin Shortness Of Breath 05/23/2015  . Effexor [venlafaxine] Anaphylaxis 01/13/2015  . Gabapentin Anaphylaxis 05/30/2016  . Libritabs [chlordiazepoxide] Anaphylaxis 02/16/2017  . Quetiapine Hives, Itching, and Rash 01/26/2015  . Sulfa antibiotics Anaphylaxis 08/31/2011  . Tetracyclines & related Anaphylaxis 08/31/2011  . Trazodone and nefazodone Anaphylaxis 01/13/2015  . Latex Hives and Itching 08/31/2011  . Paxil [paroxetine] Hives and Itching 01/13/2015  . Seroquel [quetiapine fumarate] Hives and Itching 01/13/2015  . Zoloft [sertraline hcl] Hives and Itching 01/13/2015    ROS:  General: Negative for anorexia, weight loss,  fever, chills, fatigue, weakness. ENT: Negative for hoarseness, difficulty swallowing , nasal congestion. CV: Negative for chest pain, angina, palpitations, dyspnea on exertion, peripheral edema.  Respiratory: Negative for dyspnea at rest, dyspnea on exertion, cough, sputum, wheezing.  GI: See history of present illness. GU:  Negative for dysuria, hematuria, urinary incontinence, urinary frequency, nocturnal urination.  Endo: Negative for unusual weight change.    Physical Examination:   There were no vitals taken for this visit.  General: Well-nourished, well-developed in no acute distress.  Eyes: No icterus. Conjunctivae pink. Lungs: Clear to auscultation bilaterally. Non-labored. Heart: Regular rate and rhythm, no murmurs rubs or gallops.  Abdomen: Bowel sounds are normal, nontender, nondistended, no hepatosplenomegaly or masses, no abdominal bruits or hernia , no rebound or guarding.   Extremities: No lower extremity edema. No clubbing or deformities. Neuro: Alert and oriented x 3.  Grossly intact. Skin: Warm and dry, no jaundice.   Psych: Alert and cooperative, normal mood and affect.  Labs:    Imaging Studies: No results found.  Assessment and Plan:   Kristine Macias is a 34 y.o. y/o female ***     Lucilla Lame, MD. Marval Regal    Note: This dictation was prepared with Dragon dictation along with smaller phrase technology. Any transcriptional errors that result from this process are unintentional.

## 2021-03-12 ENCOUNTER — Observation Stay: Payer: Medicaid Other

## 2021-03-12 ENCOUNTER — Observation Stay
Admission: EM | Admit: 2021-03-12 | Discharge: 2021-03-13 | Disposition: A | Discharge status: Home / Self Care | Payer: Medicaid Other | Attending: Internal Medicine | Admitting: Internal Medicine

## 2021-03-12 ENCOUNTER — Encounter: Payer: Self-pay | Admitting: Emergency Medicine

## 2021-03-12 ENCOUNTER — Emergency Department: Payer: Medicaid Other

## 2021-03-12 ENCOUNTER — Other Ambulatory Visit: Payer: Self-pay

## 2021-03-12 DIAGNOSIS — R945 Abnormal results of liver function studies: Secondary | ICD-10-CM | POA: Diagnosis not present

## 2021-03-12 DIAGNOSIS — F10239 Alcohol dependence with withdrawal, unspecified: Secondary | ICD-10-CM | POA: Diagnosis not present

## 2021-03-12 DIAGNOSIS — F10230 Alcohol dependence with withdrawal, uncomplicated: Secondary | ICD-10-CM

## 2021-03-12 DIAGNOSIS — Z853 Personal history of malignant neoplasm of breast: Secondary | ICD-10-CM | POA: Diagnosis not present

## 2021-03-12 DIAGNOSIS — F1010 Alcohol abuse, uncomplicated: Secondary | ICD-10-CM

## 2021-03-12 DIAGNOSIS — R6510 Systemic inflammatory response syndrome (SIRS) of non-infectious origin without acute organ dysfunction: Secondary | ICD-10-CM | POA: Diagnosis not present

## 2021-03-12 DIAGNOSIS — F10930 Alcohol use, unspecified with withdrawal, uncomplicated: Secondary | ICD-10-CM

## 2021-03-12 DIAGNOSIS — Y906 Blood alcohol level of 120-199 mg/100 ml: Secondary | ICD-10-CM | POA: Insufficient documentation

## 2021-03-12 DIAGNOSIS — Z91040 Latex allergy status: Secondary | ICD-10-CM | POA: Diagnosis not present

## 2021-03-12 DIAGNOSIS — F10939 Alcohol use, unspecified with withdrawal, unspecified: Secondary | ICD-10-CM | POA: Diagnosis present

## 2021-03-12 DIAGNOSIS — R109 Unspecified abdominal pain: Secondary | ICD-10-CM | POA: Diagnosis present

## 2021-03-12 DIAGNOSIS — K219 Gastro-esophageal reflux disease without esophagitis: Secondary | ICD-10-CM | POA: Diagnosis present

## 2021-03-12 DIAGNOSIS — K920 Hematemesis: Secondary | ICD-10-CM

## 2021-03-12 DIAGNOSIS — Z79899 Other long term (current) drug therapy: Secondary | ICD-10-CM | POA: Insufficient documentation

## 2021-03-12 DIAGNOSIS — Z720 Tobacco use: Secondary | ICD-10-CM | POA: Diagnosis present

## 2021-03-12 DIAGNOSIS — F17210 Nicotine dependence, cigarettes, uncomplicated: Secondary | ICD-10-CM | POA: Insufficient documentation

## 2021-03-12 DIAGNOSIS — I10 Essential (primary) hypertension: Secondary | ICD-10-CM | POA: Diagnosis not present

## 2021-03-12 DIAGNOSIS — R079 Chest pain, unspecified: Secondary | ICD-10-CM

## 2021-03-12 DIAGNOSIS — I2510 Atherosclerotic heart disease of native coronary artery without angina pectoris: Secondary | ICD-10-CM | POA: Insufficient documentation

## 2021-03-12 DIAGNOSIS — R1013 Epigastric pain: Secondary | ICD-10-CM | POA: Diagnosis not present

## 2021-03-12 DIAGNOSIS — Z20822 Contact with and (suspected) exposure to covid-19: Secondary | ICD-10-CM | POA: Insufficient documentation

## 2021-03-12 DIAGNOSIS — K2980 Duodenitis without bleeding: Secondary | ICD-10-CM | POA: Diagnosis present

## 2021-03-12 DIAGNOSIS — R7989 Other specified abnormal findings of blood chemistry: Secondary | ICD-10-CM | POA: Diagnosis present

## 2021-03-12 DIAGNOSIS — R1010 Upper abdominal pain, unspecified: Secondary | ICD-10-CM

## 2021-03-12 DIAGNOSIS — F411 Generalized anxiety disorder: Secondary | ICD-10-CM

## 2021-03-12 DIAGNOSIS — R112 Nausea with vomiting, unspecified: Secondary | ICD-10-CM | POA: Diagnosis present

## 2021-03-12 DIAGNOSIS — D72829 Elevated white blood cell count, unspecified: Secondary | ICD-10-CM | POA: Diagnosis present

## 2021-03-12 LAB — TYPE AND SCREEN
ABO/RH(D): O POS
Antibody Screen: NEGATIVE

## 2021-03-12 LAB — DIFFERENTIAL
Abs Immature Granulocytes: 0.07 10*3/uL (ref 0.00–0.07)
Basophils Absolute: 0 10*3/uL (ref 0.0–0.1)
Basophils Relative: 0 %
Eosinophils Absolute: 0.1 10*3/uL (ref 0.0–0.5)
Eosinophils Relative: 0 %
Immature Granulocytes: 0 %
Lymphocytes Relative: 9 %
Lymphs Abs: 1.8 10*3/uL (ref 0.7–4.0)
Monocytes Absolute: 1.3 10*3/uL — ABNORMAL HIGH (ref 0.1–1.0)
Monocytes Relative: 6 %
Neutro Abs: 16.6 10*3/uL — ABNORMAL HIGH (ref 1.7–7.7)
Neutrophils Relative %: 85 %

## 2021-03-12 LAB — CBC
HCT: 52 % — ABNORMAL HIGH (ref 36.0–46.0)
Hemoglobin: 17.2 g/dL — ABNORMAL HIGH (ref 12.0–15.0)
MCH: 30.5 pg (ref 26.0–34.0)
MCHC: 33.1 g/dL (ref 30.0–36.0)
MCV: 92.2 fL (ref 80.0–100.0)
Platelets: 327 10*3/uL (ref 150–400)
RBC: 5.64 MIL/uL — ABNORMAL HIGH (ref 3.87–5.11)
RDW: 13.7 % (ref 11.5–15.5)
WBC: 24.1 10*3/uL — ABNORMAL HIGH (ref 4.0–10.5)
nRBC: 0 % (ref 0.0–0.2)

## 2021-03-12 LAB — BLOOD GAS, VENOUS
Acid-base deficit: 8.7 mmol/L — ABNORMAL HIGH (ref 0.0–2.0)
Bicarbonate: 18.4 mmol/L — ABNORMAL LOW (ref 20.0–28.0)
O2 Saturation: 37.9 %
Patient temperature: 37
pCO2, Ven: 43 mmHg — ABNORMAL LOW (ref 44.0–60.0)
pH, Ven: 7.24 — ABNORMAL LOW (ref 7.250–7.430)
pO2, Ven: <31 mmHg — CL (ref 32.0–45.0)

## 2021-03-12 LAB — URINALYSIS, COMPLETE (UACMP) WITH MICROSCOPIC
Bacteria, UA: NONE SEEN
Bilirubin Urine: NEGATIVE
Glucose, UA: NEGATIVE mg/dL
Ketones, ur: 80 mg/dL — AB
Leukocytes,Ua: NEGATIVE
Nitrite: NEGATIVE
Protein, ur: 100 mg/dL — AB
Specific Gravity, Urine: 1.017 (ref 1.005–1.030)
pH: 5 (ref 5.0–8.0)

## 2021-03-12 LAB — LACTIC ACID, PLASMA
Lactic Acid, Venous: 1.9 mmol/L (ref 0.5–1.9)
Lactic Acid, Venous: 1.4 mmol/L (ref 0.5–1.9)

## 2021-03-12 LAB — URINE DRUG SCREEN, QUALITATIVE (ARMC ONLY)
Amphetamines, Ur Screen: NOT DETECTED
Barbiturates, Ur Screen: NOT DETECTED
Benzodiazepine, Ur Scrn: NOT DETECTED
Cannabinoid 50 Ng, Ur ~~LOC~~: POSITIVE — AB
Cocaine Metabolite,Ur ~~LOC~~: NOT DETECTED
MDMA (Ecstasy)Ur Screen: NOT DETECTED
Methadone Scn, Ur: NOT DETECTED
Opiate, Ur Screen: POSITIVE — AB
Phencyclidine (PCP) Ur S: NOT DETECTED
Tricyclic, Ur Screen: POSITIVE — AB

## 2021-03-12 LAB — BASIC METABOLIC PANEL
Anion gap: 28 — ABNORMAL HIGH (ref 5–15)
BUN: 18 mg/dL (ref 6–20)
CO2: 13 mmol/L — ABNORMAL LOW (ref 22–32)
Calcium: 9.4 mg/dL (ref 8.9–10.3)
Chloride: 97 mmol/L — ABNORMAL LOW (ref 98–111)
Creatinine, Ser: 1.02 mg/dL — ABNORMAL HIGH (ref 0.44–1.00)
GFR, Estimated: >60 mL/min (ref >60)
Glucose, Bld: 82 mg/dL (ref 70–99)
Potassium: 3.8 mmol/L (ref 3.5–5.1)
Sodium: 138 mmol/L (ref 135–145)

## 2021-03-12 LAB — HEPATIC FUNCTION PANEL
ALT: 33 U/L (ref 0–44)
AST: 57 U/L — ABNORMAL HIGH (ref 15–41)
Albumin: 4.8 g/dL (ref 3.5–5.0)
Alkaline Phosphatase: 116 U/L (ref 38–126)
Bilirubin, Direct: 0.1 mg/dL (ref 0.0–0.2)
Indirect Bilirubin: 0.7 mg/dL (ref 0.3–0.9)
Total Bilirubin: 0.8 mg/dL (ref 0.3–1.2)
Total Protein: 9.6 g/dL — ABNORMAL HIGH (ref 6.5–8.1)

## 2021-03-12 LAB — RESP PANEL BY RT-PCR (FLU A&B, COVID) ARPGX2
Influenza A by PCR: NEGATIVE
Influenza B by PCR: NEGATIVE
SARS Coronavirus 2 by RT PCR: NEGATIVE

## 2021-03-12 LAB — MAGNESIUM: Magnesium: 1.6 mg/dL — ABNORMAL LOW (ref 1.7–2.4)

## 2021-03-12 LAB — TROPONIN I (HIGH SENSITIVITY)
Troponin I (High Sensitivity): 12 ng/L (ref <18)
Troponin I (High Sensitivity): 11 ng/L (ref <18)

## 2021-03-12 LAB — PROTIME-INR
INR: 1.2 (ref 0.8–1.2)
Prothrombin Time: 14.8 seconds (ref 11.4–15.2)

## 2021-03-12 LAB — PREGNANCY, URINE: Preg Test, Ur: NEGATIVE

## 2021-03-12 LAB — POC URINE PREG, ED: Preg Test, Ur: NEGATIVE

## 2021-03-12 LAB — HEMOGLOBIN A1C
Hgb A1c MFr Bld: 5.5 % (ref 4.8–5.6)
Mean Plasma Glucose: 111.15 mg/dL

## 2021-03-12 LAB — PHOSPHORUS: Phosphorus: 3.4 mg/dL (ref 2.5–4.6)

## 2021-03-12 LAB — PROCALCITONIN: Procalcitonin: 0.33 ng/mL

## 2021-03-12 LAB — LIPASE, BLOOD: Lipase: 19 U/L (ref 11–51)

## 2021-03-12 LAB — APTT: aPTT: 28 seconds (ref 24–36)

## 2021-03-12 LAB — ETHANOL: Alcohol, Ethyl (B): 147 mg/dL — ABNORMAL HIGH (ref <10)

## 2021-03-12 MED ORDER — ONDANSETRON HCL 4 MG/2ML IJ SOLN
4.0000 mg | Freq: Once | INTRAMUSCULAR | Status: AC
  Administered 2021-03-12: 4 mg via INTRAVENOUS
  Filled 2021-03-12: qty 2

## 2021-03-12 MED ORDER — MORPHINE SULFATE (PF) 2 MG/ML IV SOLN
2.0000 mg | INTRAVENOUS | Status: DC | PRN
Start: 2021-03-12 — End: 2021-03-13
  Administered 2021-03-12 – 2021-03-13 (×4): 2 mg via INTRAVENOUS
  Filled 2021-03-12 (×4): qty 1

## 2021-03-12 MED ORDER — PANTOPRAZOLE SODIUM 40 MG IV SOLR
40.0000 mg | Freq: Once | INTRAVENOUS | Status: AC
  Administered 2021-03-12: 40 mg via INTRAVENOUS
  Filled 2021-03-12: qty 40

## 2021-03-12 MED ORDER — LORAZEPAM 2 MG/ML IJ SOLN
1.0000 mg | INTRAMUSCULAR | Status: DC | PRN
  Administered 2021-03-12: 2 mg via INTRAVENOUS

## 2021-03-12 MED ORDER — MAGNESIUM SULFATE 4 GM/100ML IV SOLN
4.0000 g | Freq: Once | INTRAVENOUS | Status: AC
  Administered 2021-03-12: 4 g via INTRAVENOUS
  Filled 2021-03-12: qty 100

## 2021-03-12 MED ORDER — NITROGLYCERIN 0.4 MG SL SUBL: 0.4000 mg | SUBLINGUAL_TABLET | SUBLINGUAL | Status: DC | PRN

## 2021-03-12 MED ORDER — FOLIC ACID 1 MG PO TABS
1.0000 mg | ORAL_TABLET | Freq: Every day | ORAL | Status: DC
  Administered 2021-03-12 – 2021-03-13 (×2): 1 mg via ORAL
  Filled 2021-03-12 (×2): qty 1

## 2021-03-12 MED ORDER — SODIUM CHLORIDE 0.9 % IV BOLUS
1000.0000 mL | Freq: Once | INTRAVENOUS | Status: AC
  Administered 2021-03-12: 1000 mL via INTRAVENOUS

## 2021-03-12 MED ORDER — LORAZEPAM 2 MG/ML IJ SOLN
2.0000 mg | Freq: Once | INTRAMUSCULAR | Status: AC
  Administered 2021-03-12: 2 mg via INTRAVENOUS
  Filled 2021-03-12: qty 1

## 2021-03-12 MED ORDER — LORAZEPAM 1 MG PO TABS
1.0000 mg | ORAL_TABLET | ORAL | Status: DC | PRN
  Administered 2021-03-13: 2 mg via ORAL
  Filled 2021-03-12: qty 2

## 2021-03-12 MED ORDER — MELATONIN 5 MG PO TABS: 5.0000 mg | ORAL_TABLET | Freq: Every evening | ORAL | Status: DC | PRN

## 2021-03-12 MED ORDER — IOHEXOL 350 MG/ML SOLN
75.0000 mL | Freq: Once | INTRAVENOUS | Status: AC | PRN
  Administered 2021-03-12: 75 mL via INTRAVENOUS

## 2021-03-12 MED ORDER — PANTOPRAZOLE SODIUM 40 MG IV SOLR
40.0000 mg | Freq: Two times a day (BID) | INTRAVENOUS | Status: DC
  Administered 2021-03-13: 40 mg via INTRAVENOUS
  Filled 2021-03-12: qty 40

## 2021-03-12 MED ORDER — ALBUTEROL SULFATE (2.5 MG/3ML) 0.083% IN NEBU
3.0000 mL | INHALATION_SOLUTION | RESPIRATORY_TRACT | Status: DC | PRN
Start: 2021-03-12 — End: 2021-03-13

## 2021-03-12 MED ORDER — MORPHINE SULFATE (PF) 2 MG/ML IV SOLN
2.0000 mg | Freq: Once | INTRAVENOUS | Status: AC
  Administered 2021-03-12: 2 mg via INTRAVENOUS
  Filled 2021-03-12: qty 1

## 2021-03-12 MED ORDER — LORAZEPAM 2 MG/ML IJ SOLN
1.0000 mg | Freq: Once | INTRAMUSCULAR | Status: AC
  Administered 2021-03-12: 1 mg via INTRAVENOUS
  Filled 2021-03-12: qty 1

## 2021-03-12 MED ORDER — LORAZEPAM 2 MG/ML IJ SOLN: 0.0000 mg | Freq: Two times a day (BID) | INTRAMUSCULAR | Status: DC

## 2021-03-12 MED ORDER — ACETAMINOPHEN 325 MG PO TABS
650.0000 mg | ORAL_TABLET | Freq: Four times a day (QID) | ORAL | Status: DC | PRN
Start: 2021-03-12 — End: 2021-03-13

## 2021-03-12 MED ORDER — ADULT MULTIVITAMIN W/MINERALS CH
1.0000 | ORAL_TABLET | Freq: Every day | ORAL | Status: DC
  Administered 2021-03-12 – 2021-03-13 (×2): 1 via ORAL
  Filled 2021-03-12 (×2): qty 1

## 2021-03-12 MED ORDER — IOHEXOL 300 MG/ML  SOLN
75.0000 mL | Freq: Once | INTRAMUSCULAR | Status: AC | PRN
  Administered 2021-03-12: 75 mL via INTRAVENOUS

## 2021-03-12 MED ORDER — THIAMINE HCL 100 MG/ML IJ SOLN: 100.0000 mg | Freq: Every day | INTRAMUSCULAR | Status: DC

## 2021-03-12 MED ORDER — DIPHENHYDRAMINE HCL 25 MG PO CAPS: 50.0000 mg | ORAL_CAPSULE | Freq: Two times a day (BID) | ORAL | Status: DC | PRN

## 2021-03-12 MED ORDER — TRAZODONE HCL 100 MG PO TABS
100.0000 mg | ORAL_TABLET | Freq: Every evening | ORAL | Status: DC | PRN
Start: 2021-03-12 — End: 2021-03-13

## 2021-03-12 MED ORDER — ONDANSETRON HCL 4 MG/2ML IJ SOLN
4.0000 mg | Freq: Three times a day (TID) | INTRAMUSCULAR | Status: DC | PRN
  Administered 2021-03-12 – 2021-03-13 (×2): 4 mg via INTRAVENOUS
  Filled 2021-03-12 (×2): qty 2

## 2021-03-12 MED ORDER — DM-GUAIFENESIN ER 30-600 MG PO TB12: 1.0000 | ORAL_TABLET | Freq: Two times a day (BID) | ORAL | Status: DC | PRN

## 2021-03-12 MED ORDER — THIAMINE HCL 100 MG/ML IJ SOLN
Freq: Once | INTRAVENOUS | Status: AC
  Filled 2021-03-12: qty 1000

## 2021-03-12 MED ORDER — LORAZEPAM 2 MG/ML IJ SOLN
0.0000 mg | Freq: Four times a day (QID) | INTRAMUSCULAR | Status: DC
  Administered 2021-03-13: 2 mg via INTRAVENOUS
  Filled 2021-03-12 (×2): qty 1

## 2021-03-12 MED ORDER — SODIUM CHLORIDE 0.9 % IV SOLN: INTRAVENOUS | Status: DC

## 2021-03-12 MED ORDER — NICOTINE 21 MG/24HR TD PT24
21.0000 mg | MEDICATED_PATCH | Freq: Every day | TRANSDERMAL | Status: DC
  Filled 2021-03-12 (×2): qty 1

## 2021-03-12 MED ORDER — HYDRALAZINE HCL 20 MG/ML IJ SOLN: 5.0000 mg | INTRAMUSCULAR | Status: DC | PRN

## 2021-03-12 MED ORDER — THIAMINE HCL 100 MG PO TABS
100.0000 mg | ORAL_TABLET | Freq: Every day | ORAL | Status: DC
  Administered 2021-03-12 – 2021-03-13 (×2): 100 mg via ORAL
  Filled 2021-03-12 (×2): qty 1

## 2021-03-12 NOTE — ED Notes (Signed)
Pt at CT

## 2021-03-12 NOTE — ED Notes (Signed)
Pt appears to be tremulous and not able to sit still at this time, pt does state she drinks beer everyday and states has not had any alcohol in 2-3 days due to vomiting, MD aware, new orders for ativan, see MAR.

## 2021-03-12 NOTE — H&P (Signed)
History and Physical    Kristine Macias RAQ:[id number] DOB: 1986/02/12 DOA: 03/12/2021  Referring MD/NP/PA:   PCP: Patient, No Pcp Per (Inactive)   Patient coming from:  The patient is coming from home.  At baseline, pt is independent for most of ADL.        Chief Complaint: Right flank pain, chest pain, epigastric abdominal pain, alcohol withdrawal  HPI: Kristine Macias is a 35 y.o. female with medical history significant of tobacco abuse, alcohol abuse, hypertension, GERD, depression with anxiety, pneumothorax, alcohol withdrawal seizure, pancreas divisum, GI bleeding, kidney stone, portal vein thrombosis not on anticoagulants, who presents with right flank pain, chest pain, epigastric abdominal pain, alcohol withdrawal.  Patient states that she started having right flank pain since last night, which is constant, sharp, 10 out of 10 severity, extending to the front abdomen.  Patient states that her right flank pain is worse when she takes deep breath. She also reports chest pain, which is located in the front chest, sharp, 4 out of 10 in severity, nonradiating. Her chest pain is pleuritic, worse on deep breath.  Patient has mild cough, shortness of breath, no fever or chills. She has epigastric abdominal pain, which is constant, moderate, nonradiating.  Associated with nausea and multiple episodes of vomiting, no diarrhea.  Patient states she vomits up brown dark materials.  Denies symptoms of UTI.  States that she had rectal bleeding in the past, currently no rectal bleeding. Pt state she drinks beer everyday, but has not had any alcohol in 2 days due to vomiting. She is tremulous.  ED Course: pt was found to have WBC 24.1, magnesium 1.6, phosphorus 3.4, potassium 3.8, negative pregnancy test, negative urinalysis, negative COVID-19 test, alcohol level 147, GFR> 60, lipase 19, liver function (ALP 116, AST 57, ALT 33, total bilirubin 0.1), temperature normal, blood pressure 123/82, heart  rate 119, RR 33, oxygen saturation 94% on room air.  CT abdomen/pelvis negative for acute intra abdominal issues.  VBG with pH 7.24, CO2 43, O2<31. Patient is placed on MedSurg bed for observation.  CT of abd/pelvis: 1. No acute abdominopelvic findings. 2. Normal appendix.  No ureteral obstruction. 3. Common bile duct upper limits of normal at 6 mm. 4. No pancreatic inflammation. 5. Normal ovaries and uterus.  Review of Systems:   General: no fevers, chills, no body weight gain, has poor appetite, has fatigue HEENT: no blurry vision, hearing changes or sore throat Respiratory: has dyspnea, coughing, no wheezing CV: has chest pain, no palpitations GI: has nausea, vomiting, abdominal pain, no diarrhea, constipation GU: no dysuria, burning on urination, increased urinary frequency, hematuria  Ext: no leg edema Neuro: no unilateral weakness, numbness, or tingling, no vision change or hearing loss Skin: no rash, no skin tear. MSK: No muscle spasm, no deformity, no limitation of range of movement in spin. Has right flank pain Heme: No easy bruising.  Travel history: No recent long distant travel.  Allergy:  Allergies  Allergen Reactions   Aspirin Shortness Of Breath   Effexor [Venlafaxine] Anaphylaxis   Gabapentin Anaphylaxis   Libritabs [Chlordiazepoxide] Anaphylaxis    Tolerates lorazepam and clonazepam   Quetiapine Hives, Itching and Rash   Sulfa Antibiotics Anaphylaxis   Tetracyclines & Related Anaphylaxis   Trazodone And Nefazodone Anaphylaxis   Latex Hives and Itching   Paxil [Paroxetine] Hives and Itching   Seroquel [Quetiapine Fumarate] Hives and Itching   Zoloft [Sertraline Hcl] Hives and Itching    Past Medical History:  Diagnosis Date   Alcohol abuse 11/2014   drinking since age 34. chronic, recurrent. at least 2 detox admits before 2015.    Alcoholic hepatitis 08/6107   hepatic steatosis on 11/2014 ultrasound.    Breast cancer [facility])    age 41, lump removed from  left breast   Chlamydia 2007   bacterial vaginosis 09/2011   Colitis 12/2014   colonoscopy for diarrhea and abnml CT 01/06/15: erythmatous TI (path: active ileitis, ? emerging IBD), rectal erythema (path: mucosal prolapse). Random bx of normal colon (path unremarkable)    Congenital deafness    left ear only.  Also noted in mother and sibling (unilateral).    Crohn's disease ([place])    Depression with anxiety initally at age 76   Failure to thrive in adult 12/2014.    Malnutrition: n/v, not eating, weight loss, BMI 14.  s/p 01/11/2015 PEG (Dr Dorna Leitz).    Hypertensive urgency    Irregular heart beat 2010   wt/diet related after evaluation   Pancreas divisum 02/2015   Type 1 seen on CT.    Pneumothorax, spontaneous, tension    Renal disorder    Seizure ([place]) 06/2015   due to ETOH/benzo withdrawal.    Spontaneous pneumothorax 08/2012   right.  chest tube placed.    Thrombocytopenia ([place]) 04/2007    Past Surgical History:  Procedure Laterality Date   CESAREAN SECTION  07/2009    x 1.  G3, Para 2012.    CHEST TUBE INSERTION     COLONOSCOPY WITH PROPOFOL N/A 01/06/2015   [facility], Dr Rayann Heman. colonoscopy for diarrhea and abnml CT 01/06/15: erythmatous TI (path: active ileitis, ? emerging IBD), rectal erythema (path: mucosal prolapse). Random bx of normal colon (path unremarkable)    ERCP N/A 09/28/2019   Procedure: ENDOSCOPIC RETROGRADE CHOLANGIOPANCREATOGRAPHY (ERCP);  Surgeon: Lucilla Lame, MD;  Location: [facility] ENDOSCOPY;  Service: Endoscopy;  Laterality: N/A;   ESOPHAGOGASTRODUODENOSCOPY N/A 01/06/2015   ;[facility], Dr Rayann Heman. For wt loss, N/V: Normal study, duodenal biopsy/pathology: chronic active duodenitis.    ESOPHAGOGASTRODUODENOSCOPY N/A 01/11/2015   Rein-normal with PEG placement   PEG PLACEMENT N/A 01/11/2015   [facility], Dr Rayann Heman.  for N/V/wt loss/severe malnutrition.     Social History:  reports that she has been smoking e-cigarettes. She has a 13.00 pack-year smoking history. She has never used smokeless  tobacco. She reports current alcohol use of about 36.0 standard drinks of alcohol per week. She reports that she does not use drugs.  Family History:  Family History  Problem Relation Age of Onset   Thyroid disease Mother    Cancer Mother        breast cancer   Cancer Father        lung cancer   Stroke Other    Crohn's disease Brother    Cancer Maternal Grandmother        breast cancer   Anesthesia problems Neg Hx      Prior to Admission medications   Medication Sig Start Date End Date Taking? Authorizing Provider  albuterol (PROVENTIL HFA;VENTOLIN HFA) 108 (90 Base) MCG/ACT inhaler Inhale 2 puffs into the lungs every 6 (six) hours as needed for wheezing or shortness of breath. 02/13/18  Yes Demetrios Loll, MD  diphenhydrAMINE (BENADRYL) 50 MG capsule Take 50 mg by mouth 2 (two) times daily as needed for allergies.   Yes [provider]  folic acid (FOLVITE) 1 MG tablet Take 1 tablet (1 mg total) by mouth daily. 07/08/20  Yes Pahwani, Rinka  R, MD  Melatonin 5 MG TABS Take 5 mg by mouth at bedtime as needed (sleep).   Yes [provider]  Multiple Vitamin (MULTIVITAMIN WITH MINERALS) TABS tablet Take 1 tablet by mouth daily. 07/08/20  Yes Pahwani, Rinka R, MD  omeprazole (PRILOSEC) 40 MG capsule Take 1 capsule (40 mg total) by mouth daily. 12/11/19 03/12/21 Yes Lilia Pro., MD  ondansetron (ZOFRAN) 4 MG tablet Take 1 tablet (4 mg total) by mouth every 6 (six) hours as needed for nausea. 07/07/20  Yes Pahwani, Rinka R, MD  thiamine 100 MG tablet Take 1 tablet (100 mg total) by mouth daily. 07/10/20  Yes Pahwani, Michell Heinrich, MD  traZODone (DESYREL) 100 MG tablet Take 1 tablet (100 mg total) by mouth at bedtime as needed for sleep (may take 96m or 1016m. 07/07/20  Yes Clapacs, JoMadie RenoMD  vitamin B-12 1000 MCG tablet Take 1 tablet (1,000 mcg total) by mouth daily. 07/08/20  Yes Pahwani, Rinka R, MD  LORazepam (ATIVAN) 1 MG tablet Day 1: 2 mg (2 tablets) every 8 hours. Day 2: 2  mg (2 tablets) every 8 hours. Day 3: 1 mg (1 tablet) every 8 hours. Day 4: 1 mg (1 tablet) every 12 hours) Day 5: 1 mg (1 tablet) at bedtime. Patient not taking: Reported on 03/12/2021 07/07/20   ClGonzella LexMD    Physical Exam: Vitals:   03/12/21 1030 03/12/21 1130 03/12/21 1200 03/12/21 1434  BP: 130/85 103/63 123/82 123/82  Pulse: (!) 106 97  100  Resp: 17  (!) 33 18  Temp:      TempSrc:      SpO2: 100% 99% 97% 99%  Weight:      Height:       General: Not in acute distress.  Patient is tremulous HEENT:       Eyes: PERRL, EOMI, no scleral icterus.       ENT: No discharge from the ears and nose, no pharynx injection, no tonsillar enlargement.        Neck: No JVD, no bruit, no mass felt. Heme: No neck lymph node enlargement. Cardiac: S1/S2, RRR, No murmurs, No gallops or rubs. Respiratory: No rales, wheezing, rhonchi or rubs. GI: Soft, nondistended, has tenderness in epigastric area, no rebound pain, no organomegaly, BS present. GU: No hematuria Ext: No pitting leg edema bilaterally. 1+DP/PT pulse bilaterally. Musculoskeletal: Has tenderness in the right flank area Skin: No rashes.  Neuro: Alert, oriented X3, cranial nerves II-XII grossly intact, moves all extremities normally.  Psych: Patient is not psychotic, no suicidal or hemocidal ideation.  Labs on Admission: I have personally reviewed following labs and imaging studies  CBC: Recent Labs  Lab 03/12/21 0933 03/12/21 1505  WBC 24.1*  --   NEUTROABS  --  16.6*  HGB 17.2*  --   HCT 52.0*  --   MCV 92.2  --   PLT 327  --    Basic Metabolic Panel: Recent Labs  Lab 03/12/21 0933 03/12/21 1023  NA 138  --   K 3.8  --   CL 97*  --   CO2 13*  --   GLUCOSE 82  --   BUN 18  --   CREATININE 1.02*  --   CALCIUM 9.4  --   MG  --  1.6*  PHOS  --  3.4   GFR: Estimated Creatinine Clearance: 72.1 mL/min (A) (by C-G formula based on SCr of 1.02 mg/dL (H)). Liver Function Tests: Recent Labs  Lab 03/12/21 1023   AST 57*  ALT 33  ALKPHOS 116  BILITOT 0.8  PROT 9.6*  ALBUMIN 4.8   Recent Labs  Lab 03/12/21 1023  LIPASE 19   No results for input(s): AMMONIA in the last 168 hours. Coagulation Profile: No results for input(s): INR, PROTIME in the last 168 hours. Cardiac Enzymes: No results for input(s): CKTOTAL, CKMB, CKMBINDEX, TROPONINI in the last 168 hours. BNP (last 3 results) No results for input(s): PROBNP in the last 8760 hours. HbA1C: No results for input(s): HGBA1C in the last 72 hours. CBG: No results for input(s): GLUCAP in the last 168 hours. Lipid Profile: No results for input(s): CHOL, HDL, LDLCALC, TRIG, CHOLHDL, LDLDIRECT in the last 72 hours. Thyroid Function Tests: No results for input(s): TSH, T4TOTAL, FREET4, T3FREE, THYROIDAB in the last 72 hours. Anemia Panel: No results for input(s): VITAMINB12, FOLATE, FERRITIN, TIBC, IRON, RETICCTPCT in the last 72 hours. Urine analysis:    Component Value Date/Time   COLORURINE YELLOW (A) 03/12/2021 0933   APPEARANCEUR HAZY (A) 03/12/2021 0933   APPEARANCEUR Clear 02/15/2016 1456   LABSPEC 1.017 03/12/2021 0933   LABSPEC 1.006 12/20/2014 1018   PHURINE 5.0 03/12/2021 0933   GLUCOSEU NEGATIVE 03/12/2021 0933   GLUCOSEU Negative 12/20/2014 1018   HGBUR MODERATE (A) 03/12/2021 0933   BILIRUBINUR NEGATIVE 03/12/2021 0933   BILIRUBINUR neg 03/13/2016 1700   BILIRUBINUR Negative 02/15/2016 1456   BILIRUBINUR Negative 12/20/2014 1018   KETONESUR 80 (A) 03/12/2021 0933   PROTEINUR 100 (A) 03/12/2021 0933   UROBILINOGEN negative 03/13/2016 1700   UROBILINOGEN 0.2 06/13/2012 2306   NITRITE NEGATIVE 03/12/2021 0933   LEUKOCYTESUR NEGATIVE 03/12/2021 0933   LEUKOCYTESUR Negative 12/20/2014 1018   Sepsis Labs: @LABRCNTIP (procalcitonin:4,lacticidven:4) ) Recent Results (from the past 240 hour(s))  Resp Panel by RT-PCR (Flu A&B, Covid) Nasopharyngeal Swab     Status: None   Collection Time: 03/12/21  3:38 PM   Specimen:  Nasopharyngeal Swab; Nasopharyngeal(NP) swabs in vial transport medium  Result Value Ref Range Status   SARS Coronavirus 2 by RT PCR NEGATIVE NEGATIVE Final    Comment: (NOTE) SARS-CoV-2 target nucleic acids are NOT DETECTED.  The SARS-CoV-2 RNA is generally detectable in upper respiratory specimens during the acute phase of infection. The lowest concentration of SARS-CoV-2 viral copies this assay can detect is 138 copies/mL. A negative result does not preclude SARS-Cov-2 infection and should not be used as the sole basis for treatment or other patient management decisions. A negative result may occur with  improper specimen collection/handling, submission of specimen other than nasopharyngeal swab, presence of viral mutation(s) within the areas targeted by this assay, and inadequate number of viral copies(<138 copies/mL). A negative result must be combined with clinical observations, patient history, and epidemiological information. The expected result is Negative.  Fact Sheet for Patients:  EntrepreneurPulse.com.au  Fact Sheet for Healthcare Providers:  IncredibleEmployment.be  This test is no t yet approved or cleared by the [place] FDA and  has been authorized for detection and/or diagnosis of SARS-CoV-2 by FDA under an Emergency Use Authorization (EUA). This EUA will remain  in effect (meaning this test can be used) for the duration of the COVID-19 declaration under Section 564(b)(1) of the Act, 21 U.S.C.section 360bbb-3(b)(1), unless the authorization is terminated  or revoked sooner.       Influenza A by PCR NEGATIVE NEGATIVE Final   Influenza B by PCR NEGATIVE NEGATIVE Final    Comment: (NOTE) The Xpert Xpress SARS-CoV-2/FLU/RSV plus assay  is intended as an aid in the diagnosis of influenza from Nasopharyngeal swab specimens and should not be used as a sole basis for treatment. Nasal washings and aspirates are unacceptable for  Xpert Xpress SARS-CoV-2/FLU/RSV testing.  Fact Sheet for Patients: EntrepreneurPulse.com.au  Fact Sheet for Healthcare Providers: IncredibleEmployment.be  This test is not yet approved or cleared by the [place] FDA and has been authorized for detection and/or diagnosis of SARS-CoV-2 by FDA under an Emergency Use Authorization (EUA). This EUA will remain in effect (meaning this test can be used) for the duration of the COVID-19 declaration under Section 564(b)(1) of the Act, 21 U.S.C. section 360bbb-3(b)(1), unless the authorization is terminated or revoked.  Performed at [facility], [address] [place], [place] [address]      Radiological Exams on Admission: CT ABDOMEN PELVIS W CONTRAST  Result Date: 03/12/2021 CLINICAL DATA:  Abdominal abscess EXAM: CT ABDOMEN AND PELVIS WITH CONTRAST TECHNIQUE: Multidetector CT imaging of the abdomen and pelvis was performed using the standard protocol following bolus administration of intravenous contrast. CONTRAST:  28m OMNIPAQUE IOHEXOL 300 MG/ML  SOLN COMPARISON:  None FINDINGS: Lower chest: Lung bases are clear. Hepatobiliary: Several very small low-density lesions in the LEFT lateral hepatic lobe likely represent benign cysts (image 19/2). Common bile duct is upper limits of normal at 6 mm. No radiodense common bile duct stones. No radiodense gallstones in the bladder. gallbladder measures 4.3 cm. No biliary duct dilatation. Common bile duct is normal. Pancreas: Pancreas is normal. No ductal dilatation. No pancreatic inflammation. Spleen: Normal spleen Adrenals/urinary tract: Adrenal glands and kidneys are normal. The ureters and bladder normal. Stomach/Bowel: Stomach, small bowel, appendix, and cecum are normal. The colon and rectosigmoid colon are normal. Vascular/Lymphatic: Abdominal aorta is normal caliber. No periportal or retroperitoneal adenopathy. No pelvic adenopathy. Reproductive:  Uterus and adnexa unremarkable. Other: No free fluid.  No inguinal or ventral hernia. Musculoskeletal: Small sclerotic lesion in the RIGHT iliac bone measuring 6 mm with consistent benign enostosis. IMPRESSION: 1. No acute abdominopelvic findings. 2. Normal appendix.  No ureteral obstruction. 3. Common bile duct upper limits of normal at 6 mm. 4. No pancreatic inflammation. 5. Normal ovaries and uterus. Electronically Signed   By: SSuzy BouchardM.D.   On: 03/12/2021 14:07   DG Chest Port 1 View  Result Date: 03/12/2021 CLINICAL DATA:  Acute chest pain and shortness of breath. EXAM: PORTABLE CHEST 1 VIEW COMPARISON:  07/02/2020 and prior studies FINDINGS: The cardiomediastinal silhouette is unremarkable. There is no evidence of focal airspace disease, pulmonary edema, suspicious pulmonary nodule/mass, pleural effusion, or pneumothorax. No acute bony abnormalities are identified. IMPRESSION: No active disease. Electronically Signed   By: JMargarette CanadaM.D.   On: 03/12/2021 16:59     EKG: I have personally reviewed.  Sinus rhythm, tachycardia, QTC 503, low voltage.  Assessment/Plan Principal Problem:   Right flank pain Active Problems:   Generalized anxiety disorder   Chest pain   Nausea & vomiting   Alcohol abuse   Tobacco use   Duodenitis   Leukocytosis   Alcohol withdrawal (HCC)   Epigastric abdominal pain   Abnormal LFTs   CAD (coronary artery disease)   HTN (hypertension)   GERD (gastroesophageal reflux disease)   SIRS (systemic inflammatory response syndrome) (HCC)   Hypomagnesemia   Right flank pain: Etiology is not clear.  CT abdomen/pelvis is negative for acute intra-abdominal issues.  Lipase normal.  Patient reports that left flank pain is a pleuritic, aggravated by deep  breath.  Patient also complains of pleuritic chest pain.  Will need to rule out PE.  -Please MedSurg bed for observation -As needed morphine for pain -Follow-up CT angiogram to rule out PE  Chest pain:  Patient complains of pleuritic chest pain.  Chest x-ray negative.  No oxygen desaturation, but still needed to rule out PE. -Follow-up CT angiogram to rule out PE -Trend troponin -Check A1c, FLP, UDS -As needed nitroglycerin and morphine  Generalized anxiety disorder -Patient is on Ativan for CIWA protocol  Nausea & vomiting and epigastric abdominal pain: Likely due to alcoholic gastritis.  Patient also has history of duodenitis.  Lipase normal.  CT abdomen/pelvis is negative for acute issues. -Protonix 40 mg twice daily -As needed Zofran  Alcohol abuse and Tobacco use and alcohol withdrawal: pt is tremulous. -CIWA protocol -Nicotine patch  Duodenitis -Protonix as above  Leukocytosis and SIRS: Patient meets criteria for SIRS with leukocytosis with WBC 24.1, tachycardia with heart rate 119 and RR 33.  No fever.  No source of infection identified.  Chest x-ray negative.  Urinalysis negative.  Will not give antibiotics. -IV fluid: 2 L normal saline, then 100 cc/h -will get Procalcitonin and trend lactic acid levels per sepsis protocol. -f/u Blood culture  Abnormal LFTs: mild, likely due to alcohol abuse -Careful use Tylenol  HTN (hypertension): Patient is not taking medications currently.  Blood pressure 123/82 -IV hydralazine as needed.  GERD (gastroesophageal reflux disease) -On IV Protonix  Hypomagnesemia: Magnesium 1.6.  Potassium 3.8.  Phosphorus 3.4 -Repleted potassium with 4 g of magnesium sulfate    DVT ppx: SCD Code Status: Full code Family Communication: Yes, patient's boyfriend at bed side Disposition Plan:  Anticipate discharge back to previous environment Consults called:  none Admission status and Level of care: Med-Surg:     for obs   Status is: Observation  The patient remains OBS appropriate and will d/c before 2 midnights.  Dispo: The patient is from: Home              Anticipated d/c is to: Home              Patient currently is not medically stable  to d/c.   Difficult to place patient No             Date of Service 03/12/2021    [facility]   If 7PM-7AM, please contact night-coverage [email or web address] 03/12/2021, 5:08 PM
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
## 2021-03-12 NOTE — ED Triage Notes (Signed)
Pt comes into the ED via [facility] c/o right flank pain that started about an hour ago.  Pt has h/o kidney stones.  Pt has emesis as well.  PT in NAD at this time with even and unlabored respirations.  Pt has been drinking ETOH for the past 48 hours.

## 2021-03-12 NOTE — ED Notes (Signed)
Pt has requested new PIV (22G in hand), due to being hard stick, RN with [place] ability requested to place new IV at this time.

## 2021-03-13 DIAGNOSIS — R109 Unspecified abdominal pain: Secondary | ICD-10-CM | POA: Diagnosis not present

## 2021-03-13 LAB — CBC
HCT: 38 % (ref 36.0–46.0)
Hemoglobin: 12.6 g/dL (ref 12.0–15.0)
MCH: 29.9 pg (ref 26.0–34.0)
MCHC: 33.2 g/dL (ref 30.0–36.0)
MCV: 90 fL (ref 80.0–100.0)
Platelets: 197 10*3/uL (ref 150–400)
RBC: 4.22 MIL/uL (ref 3.87–5.11)
RDW: 13.5 % (ref 11.5–15.5)
WBC: 12.3 10*3/uL — ABNORMAL HIGH (ref 4.0–10.5)
nRBC: 0 % (ref 0.0–0.2)

## 2021-03-13 LAB — COMPREHENSIVE METABOLIC PANEL
ALT: 20 U/L (ref 0–44)
AST: 32 U/L (ref 15–41)
Albumin: 3.5 g/dL (ref 3.5–5.0)
Alkaline Phosphatase: 77 U/L (ref 38–126)
Anion gap: 11 (ref 5–15)
BUN: 11 mg/dL (ref 6–20)
CO2: 20 mmol/L — ABNORMAL LOW (ref 22–32)
Calcium: 7.9 mg/dL — ABNORMAL LOW (ref 8.9–10.3)
Chloride: 101 mmol/L (ref 98–111)
Creatinine, Ser: 0.56 mg/dL (ref 0.44–1.00)
GFR, Estimated: >60 mL/min (ref >60)
Glucose, Bld: 77 mg/dL (ref 70–99)
Potassium: 3.9 mmol/L (ref 3.5–5.1)
Sodium: 132 mmol/L — ABNORMAL LOW (ref 135–145)
Total Bilirubin: 1.6 mg/dL — ABNORMAL HIGH (ref 0.3–1.2)
Total Protein: 6.8 g/dL (ref 6.5–8.1)

## 2021-03-13 LAB — LIPID PANEL
Cholesterol: 143 mg/dL (ref 0–200)
HDL: 32 mg/dL — ABNORMAL LOW (ref >40)
LDL Cholesterol: 72 mg/dL (ref 0–99)
Total CHOL/HDL Ratio: 4.5 RATIO
Triglycerides: 195 mg/dL — ABNORMAL HIGH (ref <150)
VLDL: 39 mg/dL (ref 0–40)

## 2021-03-13 LAB — HIV ANTIBODY (ROUTINE TESTING W REFLEX): HIV Screen 4th Generation wRfx: NONREACTIVE

## 2021-03-13 LAB — MAGNESIUM: Magnesium: 2.6 mg/dL — ABNORMAL HIGH (ref 1.7–2.4)

## 2021-03-13 MED ORDER — ONDANSETRON HCL 4 MG PO TABS: 4.0000 mg | ORAL_TABLET | Freq: Four times a day (QID) | ORAL | 0 refills | Status: AC | PRN

## 2021-03-13 MED ORDER — SUCRALFATE 1 GM/10ML PO SUSP: 1.0000 g | Freq: Three times a day (TID) | ORAL | 0 refills | Status: AC

## 2021-03-13 MED ORDER — SUCRALFATE 1 GM/10ML PO SUSP
1.0000 g | Freq: Three times a day (TID) | ORAL | Status: DC
  Administered 2021-03-13: 1 g via ORAL
  Filled 2021-03-13: qty 10

## 2021-03-13 MED ORDER — PANTOPRAZOLE SODIUM 40 MG PO TBEC: 40.0000 mg | DELAYED_RELEASE_TABLET | Freq: Every day | ORAL | 0 refills | Status: AC
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
## 2021-03-13 NOTE — Progress Notes (Addendum)
Pt is discharging home. Per patient her boyfriend will be here in an hour. Discharge education completed.  51 Pt called and stated that her mother will pick her up.

## 2021-03-13 NOTE — Discharge Summary (Signed)
Physician Discharge Summary  Kristine Macias IRS:[id number] DOB: [birth year] DOA: 03/12/2021  PCP: Patient, No Pcp Per (Inactive)  Admit date: 03/12/2021 Discharge date: 03/13/2021  Admitted From: Home Disposition: Home  Recommendations for Outpatient Follow-up:  Follow up with PCP in 1-2 weeks   Home Health: No Equipment/Devices: None  Discharge Condition: Stable CODE STATUS: Full Diet recommendation: Soft/bland  Brief/Interim Summary: 35 y.o. female with medical history significant of tobacco abuse, alcohol abuse, hypertension, GERD, depression with anxiety, pneumothorax, alcohol withdrawal seizure, pancreas divisum, GI bleeding, kidney stone, portal vein thrombosis not on anticoagulants, who presents with right flank pain, chest pain, epigastric abdominal pain, alcohol withdrawal.   Patient states that she started having right flank pain since last night, which is constant, sharp, 10 out of 10 severity, extending to the front abdomen.  Patient states that her right flank pain is worse when she takes deep breath. She also reports chest pain, which is located in the front chest, sharp, 4 out of 10 in severity, nonradiating. Her chest pain is pleuritic, worse on deep breath.  Patient has mild cough, shortness of breath, no fever or chills. She has epigastric abdominal pain, which is constant, moderate, nonradiating.  Associated with nausea and multiple episodes of vomiting, no diarrhea.  Patient states she vomits up brown dark materials.  Denies symptoms of UTI.  States that she had rectal bleeding in the past, currently no rectal bleeding. Pt state she drinks beer everyday, but has not had any alcohol in 2 days due to vomiting. She is tremulous.  On day of discharge patient is tolerating p.o. although small amounts.  Pain is improved.  Explained that imaging survey was negative for acute intra-abdominal issues.  I suspect that her pain is secondary to alcoholic gastritis.  I explained that  we can treat symptomatically but the optimal treatment for alcoholic gastritis and cessation of alcohol intake.  Patient had a very withdrawn and flattened affect on my interview.  Her white blood cell count markedly elevated on admission has essentially resolved.  She did come in with elevated ethanol level of 147.  Negative lipase.  CT imaging survey negative for pancreatitis.  Patient is hemodynamically stable and discharged home in stable condition.  At time of discharge will recommend Protonix and sucralfate.   Discharge Diagnoses:  Principal Problem:   Right flank pain Active Problems:   Generalized anxiety disorder   Chest pain   Nausea & vomiting   Alcohol abuse   Tobacco use   Duodenitis   Leukocytosis   Alcohol withdrawal (HCC)   Epigastric abdominal pain   Abnormal LFTs   HTN (hypertension)   GERD (gastroesophageal reflux disease)   SIRS (systemic inflammatory response syndrome) (HCC)   Hypomagnesemia  Intractable right flank pain Etiology is not clear.  CT abdomen pelvis unrevealing for cause.  Lipase normal.  I suspect alcoholic gastritis.  Patient endorsed the pain as being pleuritic chest x-ray was also negative.  Pulmonary embolism was ruled out.  At time of discharge I suspect alcoholic gastritis.  Advised patient to cease alcohol intake.  Prescribed Protonix and sucralfate at time of discharge.  Discharge Instructions  Discharge Instructions     Diet - low sodium heart healthy   Complete by: As directed    Increase activity slowly   Complete by: As directed       Allergies as of 03/13/2021       Reactions   Aspirin Shortness Of Breath   Effexor [venlafaxine] Anaphylaxis  Gabapentin Anaphylaxis   Libritabs [chlordiazepoxide] Anaphylaxis   Tolerates lorazepam and clonazepam   Quetiapine Hives, Itching, Rash   Sulfa Antibiotics Anaphylaxis   Tetracyclines & Related Anaphylaxis   Trazodone And Nefazodone Anaphylaxis   Latex Hives, Itching   Paxil  [paroxetine] Hives, Itching   Seroquel [quetiapine Fumarate] Hives, Itching   Zoloft [sertraline Hcl] Hives, Itching        Medication List     STOP taking these medications    LORazepam 1 MG tablet Commonly known as: Ativan   omeprazole 40 MG capsule Commonly known as: PRILOSEC       TAKE these medications    albuterol 108 (90 Base) MCG/ACT inhaler Commonly known as: VENTOLIN HFA Inhale 2 puffs into the lungs every 6 (six) hours as needed for wheezing or shortness of breath.   cyanocobalamin 1000 MCG tablet Take 1 tablet (1,000 mcg total) by mouth daily.   diphenhydrAMINE 50 MG capsule Commonly known as: BENADRYL Take 50 mg by mouth 2 (two) times daily as needed for allergies.   folic acid 1 MG tablet Commonly known as: FOLVITE Take 1 tablet (1 mg total) by mouth daily.   melatonin 5 MG Tabs Take 5 mg by mouth at bedtime as needed (sleep).   multivitamin with minerals Tabs tablet Take 1 tablet by mouth daily.   ondansetron 4 MG tablet Commonly known as: ZOFRAN Take 1 tablet (4 mg total) by mouth every 6 (six) hours as needed for nausea.   pantoprazole 40 MG tablet Commonly known as: Protonix Take 1 tablet (40 mg total) by mouth daily.   sucralfate 1 GM/10ML suspension Commonly known as: CARAFATE Take 10 mLs (1 g total) by mouth 4 (four) times daily -  with meals and at bedtime for 10 days.   thiamine 100 MG tablet Take 1 tablet (100 mg total) by mouth daily.   traZODone 100 MG tablet Commonly known as: DESYREL Take 1 tablet (100 mg total) by mouth at bedtime as needed for sleep (may take 64m or 1016m.        Allergies  Allergen Reactions   Aspirin Shortness Of Breath   Effexor [Venlafaxine] Anaphylaxis   Gabapentin Anaphylaxis   Libritabs [Chlordiazepoxide] Anaphylaxis    Tolerates lorazepam and clonazepam   Quetiapine Hives, Itching and Rash   Sulfa Antibiotics Anaphylaxis   Tetracyclines & Related Anaphylaxis   Trazodone And Nefazodone  Anaphylaxis   Latex Hives and Itching   Paxil [Paroxetine] Hives and Itching   Seroquel [Quetiapine Fumarate] Hives and Itching   Zoloft [Sertraline Hcl] Hives and Itching    Consultations: None   Procedures/Studies: CT Angio Chest Pulmonary Embolism (PE) W or WO Contrast  Result Date: 03/12/2021 CLINICAL DATA:  PE suspected. Onset of right flank pain radiating into front of the abdomen. This does not feel like previous bouts of gallbladder or renal stone pain. EXAM: CT ANGIOGRAPHY CHEST WITH CONTRAST TECHNIQUE: Multidetector CT imaging of the chest was performed using the standard protocol during bolus administration of intravenous contrast. Multiplanar CT image reconstructions and MIPs were obtained to evaluate the vascular anatomy. CONTRAST:  7542mMNIPAQUE IOHEXOL 350 MG/ML SOLN COMPARISON:  May 20, 2015 FINDINGS: Cardiovascular: Satisfactory opacification of the pulmonary arteries to the segmental level. No evidence of pulmonary embolism. Normal heart size. No pericardial effusion. Mediastinum/Nodes: No enlarged mediastinal, hilar, or axillary lymph nodes. Thyroid gland, trachea, and esophagus demonstrate no significant findings. Lungs/Pleura: Lungs are clear. No pleural effusion or pneumothorax. Upper Abdomen: No acute abnormality. Musculoskeletal: No  chest wall abnormality. No acute or significant osseous findings. Review of the MIP images confirms the above findings. IMPRESSION: No cause for the patient's symptoms identified. No pulmonary emboli. Electronically Signed   By: Dorise Bullion III M.D   On: 03/12/2021 18:47   CT ABDOMEN PELVIS W CONTRAST  Result Date: 03/12/2021 CLINICAL DATA:  Abdominal abscess EXAM: CT ABDOMEN AND PELVIS WITH CONTRAST TECHNIQUE: Multidetector CT imaging of the abdomen and pelvis was performed using the standard protocol following bolus administration of intravenous contrast. CONTRAST:  19m OMNIPAQUE IOHEXOL 300 MG/ML  SOLN COMPARISON:  None FINDINGS:  Lower chest: Lung bases are clear. Hepatobiliary: Several very small low-density lesions in the LEFT lateral hepatic lobe likely represent benign cysts (image 19/2). Common bile duct is upper limits of normal at 6 mm. No radiodense common bile duct stones. No radiodense gallstones in the bladder. gallbladder measures 4.3 cm. No biliary duct dilatation. Common bile duct is normal. Pancreas: Pancreas is normal. No ductal dilatation. No pancreatic inflammation. Spleen: Normal spleen Adrenals/urinary tract: Adrenal glands and kidneys are normal. The ureters and bladder normal. Stomach/Bowel: Stomach, small bowel, appendix, and cecum are normal. The colon and rectosigmoid colon are normal. Vascular/Lymphatic: Abdominal aorta is normal caliber. No periportal or retroperitoneal adenopathy. No pelvic adenopathy. Reproductive: Uterus and adnexa unremarkable. Other: No free fluid.  No inguinal or ventral hernia. Musculoskeletal: Small sclerotic lesion in the RIGHT iliac bone measuring 6 mm with consistent benign enostosis. IMPRESSION: 1. No acute abdominopelvic findings. 2. Normal appendix.  No ureteral obstruction. 3. Common bile duct upper limits of normal at 6 mm. 4. No pancreatic inflammation. 5. Normal ovaries and uterus. Electronically Signed   By: SSuzy BouchardM.D.   On: 03/12/2021 14:07   DG Chest Port 1 View  Result Date: 03/12/2021 CLINICAL DATA:  Acute chest pain and shortness of breath. EXAM: PORTABLE CHEST 1 VIEW COMPARISON:  07/02/2020 and prior studies FINDINGS: The cardiomediastinal silhouette is unremarkable. There is no evidence of focal airspace disease, pulmonary edema, suspicious pulmonary nodule/mass, pleural effusion, or pneumothorax. No acute bony abnormalities are identified. IMPRESSION: No active disease. Electronically Signed   By: JMargarette CanadaM.D.   On: 03/12/2021 16:59   (Echo, Carotid, EGD, Colonoscopy, ERCP)    Subjective: Seen and examined on the day of discharge.  Stable.   Flattened affect but in no distress.  Discharge Exam: Vitals:   03/13/21 0454 03/13/21 0927  BP: 104/80 130/74  Pulse: 82 86  Resp: 16   Temp: 97.8 F (36.6 C) 99 F (37.2 C)  SpO2: 99% 98%   Vitals:   03/12/21 1800 03/12/21 1949 03/13/21 0454 03/13/21 0927  BP: 124/75 130/84 104/80 130/74  Pulse:  97 82 86  Resp: 20 18 16    Temp:  98.3 F (36.8 C) 97.8 F (36.6 C) 99 F (37.2 C)  TempSrc:  Oral Oral Oral  SpO2: 97% 100% 99% 98%  Weight:  61.1 kg    Height:  5' 6"  (1.676 m)      General: Pt is alert, awake, not in acute distress Cardiovascular: RRR, S1/S2 +, no rubs, no gallops Respiratory: CTA bilaterally, no wheezing, no rhonchi Abdominal: Soft, NT, ND, bowel sounds + Extremities: no edema, no cyanosis    The results of significant diagnostics from this hospitalization (including imaging, microbiology, ancillary and laboratory) are listed below for reference.     Microbiology: Recent Results (from the past 240 hour(s))  Resp Panel by RT-PCR (Flu A&B, Covid) Nasopharyngeal Swab  Status: None   Collection Time: 03/12/21  3:38 PM   Specimen: Nasopharyngeal Swab; Nasopharyngeal(NP) swabs in vial transport medium  Result Value Ref Range Status   SARS Coronavirus 2 by RT PCR NEGATIVE NEGATIVE Final    Comment: (NOTE) SARS-CoV-2 target nucleic acids are NOT DETECTED.  The SARS-CoV-2 RNA is generally detectable in upper respiratory specimens during the acute phase of infection. The lowest concentration of SARS-CoV-2 viral copies this assay can detect is 138 copies/mL. A negative result does not preclude SARS-Cov-2 infection and should not be used as the sole basis for treatment or other patient management decisions. A negative result may occur with  improper specimen collection/handling, submission of specimen other than nasopharyngeal swab, presence of viral mutation(s) within the areas targeted by this assay, and inadequate number of viral copies(<138  copies/mL). A negative result must be combined with clinical observations, patient history, and epidemiological information. The expected result is Negative.  Fact Sheet for Patients:  EntrepreneurPulse.com.au  Fact Sheet for Healthcare Providers:  IncredibleEmployment.be  This test is no t yet approved or cleared by the [place] FDA and  has been authorized for detection and/or diagnosis of SARS-CoV-2 by FDA under an Emergency Use Authorization (EUA). This EUA will remain  in effect (meaning this test can be used) for the duration of the COVID-19 declaration under Section 564(b)(1) of the Act, 21 U.S.C.section 360bbb-3(b)(1), unless the authorization is terminated  or revoked sooner.       Influenza A by PCR NEGATIVE NEGATIVE Final   Influenza B by PCR NEGATIVE NEGATIVE Final    Comment: (NOTE) The Xpert Xpress SARS-CoV-2/FLU/RSV plus assay is intended as an aid in the diagnosis of influenza from Nasopharyngeal swab specimens and should not be used as a sole basis for treatment. Nasal washings and aspirates are unacceptable for Xpert Xpress SARS-CoV-2/FLU/RSV testing.  Fact Sheet for Patients: EntrepreneurPulse.com.au  Fact Sheet for Healthcare Providers: IncredibleEmployment.be  This test is not yet approved or cleared by the [place] FDA and has been authorized for detection and/or diagnosis of SARS-CoV-2 by FDA under an Emergency Use Authorization (EUA). This EUA will remain in effect (meaning this test can be used) for the duration of the COVID-19 declaration under Section 564(b)(1) of the Act, 21 U.S.C. section 360bbb-3(b)(1), unless the authorization is terminated or revoked.  Performed at [facility], [address] [place], [place] [address]      Labs: BNP (last 3 results) No results for input(s): BNP in the last 8760 hours. Basic Metabolic Panel: Recent Labs  Lab  03/12/21 0933 03/12/21 1023 03/13/21 0504  NA 138  --  132*  K 3.8  --  3.9  CL 97*  --  101  CO2 13*  --  20*  GLUCOSE 82  --  77  BUN 18  --  11  CREATININE 1.02*  --  0.56  CALCIUM 9.4  --  7.9*  MG  --  1.6* 2.6*  PHOS  --  3.4  --    Liver Function Tests: Recent Labs  Lab 03/12/21 1023 03/13/21 0504  AST 57* 32  ALT 33 20  ALKPHOS 116 77  BILITOT 0.8 1.6*  PROT 9.6* 6.8  ALBUMIN 4.8 3.5   Recent Labs  Lab 03/12/21 1023  LIPASE 19   No results for input(s): AMMONIA in the last 168 hours. CBC: Recent Labs  Lab 03/12/21 0933 03/12/21 1505 03/13/21 0504  WBC 24.1*  --  12.3*  NEUTROABS  --  16.6*  --  HGB 17.2*  --  12.6  HCT 52.0*  --  38.0  MCV 92.2  --  90.0  PLT 327  --  197   Cardiac Enzymes: No results for input(s): CKTOTAL, CKMB, CKMBINDEX, TROPONINI in the last 168 hours. BNP: Invalid input(s): POCBNP CBG: No results for input(s): GLUCAP in the last 168 hours. D-Dimer No results for input(s): DDIMER in the last 72 hours. Hgb A1c Recent Labs    03/12/21 1505  HGBA1C 5.5   Lipid Profile Recent Labs    03/13/21 0504  CHOL 143  HDL 32*  LDLCALC 72  TRIG 195*  CHOLHDL 4.5   Thyroid function studies No results for input(s): TSH, T4TOTAL, T3FREE, THYROIDAB in the last 72 hours.  Invalid input(s): FREET3 Anemia work up No results for input(s): VITAMINB12, FOLATE, FERRITIN, TIBC, IRON, RETICCTPCT in the last 72 hours. Urinalysis    Component Value Date/Time   COLORURINE YELLOW (A) 03/12/2021 0933   APPEARANCEUR HAZY (A) 03/12/2021 0933   APPEARANCEUR Clear 02/15/2016 1456   LABSPEC 1.017 03/12/2021 0933   LABSPEC 1.006 12/20/2014 1018   PHURINE 5.0 03/12/2021 0933   GLUCOSEU NEGATIVE 03/12/2021 0933   GLUCOSEU Negative 12/20/2014 1018   HGBUR MODERATE (A) 03/12/2021 0933   BILIRUBINUR NEGATIVE 03/12/2021 0933   BILIRUBINUR neg 03/13/2016 1700   BILIRUBINUR Negative 02/15/2016 1456   BILIRUBINUR Negative 12/20/2014 1018    KETONESUR 80 (A) 03/12/2021 0933   PROTEINUR 100 (A) 03/12/2021 0933   UROBILINOGEN negative 03/13/2016 1700   UROBILINOGEN 0.2 06/13/2012 2306   NITRITE NEGATIVE 03/12/2021 0933   LEUKOCYTESUR NEGATIVE 03/12/2021 0933   LEUKOCYTESUR Negative 12/20/2014 1018   Sepsis Labs Invalid input(s): PROCALCITONIN,  WBC,  LACTICIDVEN Microbiology Recent Results (from the past 240 hour(s))  Resp Panel by RT-PCR (Flu A&B, Covid) Nasopharyngeal Swab     Status: None   Collection Time: 03/12/21  3:38 PM   Specimen: Nasopharyngeal Swab; Nasopharyngeal(NP) swabs in vial transport medium  Result Value Ref Range Status   SARS Coronavirus 2 by RT PCR NEGATIVE NEGATIVE Final    Comment: (NOTE) SARS-CoV-2 target nucleic acids are NOT DETECTED.  The SARS-CoV-2 RNA is generally detectable in upper respiratory specimens during the acute phase of infection. The lowest concentration of SARS-CoV-2 viral copies this assay can detect is 138 copies/mL. A negative result does not preclude SARS-Cov-2 infection and should not be used as the sole basis for treatment or other patient management decisions. A negative result may occur with  improper specimen collection/handling, submission of specimen other than nasopharyngeal swab, presence of viral mutation(s) within the areas targeted by this assay, and inadequate number of viral copies(<138 copies/mL). A negative result must be combined with clinical observations, patient history, and epidemiological information. The expected result is Negative.  Fact Sheet for Patients:  EntrepreneurPulse.com.au  Fact Sheet for Healthcare Providers:  IncredibleEmployment.be  This test is no t yet approved or cleared by the [place] FDA and  has been authorized for detection and/or diagnosis of SARS-CoV-2 by FDA under an Emergency Use Authorization (EUA). This EUA will remain  in effect (meaning this test can be used) for the duration  of the COVID-19 declaration under Section 564(b)(1) of the Act, 21 U.S.C.section 360bbb-3(b)(1), unless the authorization is terminated  or revoked sooner.       Influenza A by PCR NEGATIVE NEGATIVE Final   Influenza B by PCR NEGATIVE NEGATIVE Final    Comment: (NOTE) The Xpert Xpress SARS-CoV-2/FLU/RSV plus assay is intended as an aid in  the diagnosis of influenza from Nasopharyngeal swab specimens and should not be used as a sole basis for treatment. Nasal washings and aspirates are unacceptable for Xpert Xpress SARS-CoV-2/FLU/RSV testing.  Fact Sheet for Patients: EntrepreneurPulse.com.au  Fact Sheet for Healthcare Providers: IncredibleEmployment.be  This test is not yet approved or cleared by the [place] FDA and has been authorized for detection and/or diagnosis of SARS-CoV-2 by FDA under an Emergency Use Authorization (EUA). This EUA will remain in effect (meaning this test can be used) for the duration of the COVID-19 declaration under Section 564(b)(1) of the Act, 21 U.S.C. section 360bbb-3(b)(1), unless the authorization is terminated or revoked.  Performed at [facility], [address], [place],  [address]      Time coordinating discharge: Over 30 minutes  SIGNED:   Sidney Ace, MD  [facility] 03/13/2021, 4:16 PM Pager   If 7PM-7AM, please contact night-coverage

## 2021-03-18 LAB — CULTURE, BLOOD (ROUTINE X 2)
Culture: NO GROWTH
Culture: NO GROWTH
Special Requests: ADEQUATE
Special Requests: ADEQUATE
